# Patient Record
Sex: Male | Born: 1960 | Race: Black or African American | Hispanic: No | Marital: Married | Smoking: Never smoker
Health system: Southern US, Community
[De-identification: ages and names within clinical notes are randomized; demographics above are authoritative.]

## PROBLEM LIST (undated history)

## (undated) DIAGNOSIS — Z95810 Presence of automatic (implantable) cardiac defibrillator: Secondary | ICD-10-CM

## (undated) DIAGNOSIS — I509 Heart failure, unspecified: Secondary | ICD-10-CM

## (undated) DIAGNOSIS — E669 Obesity, unspecified: Secondary | ICD-10-CM

## (undated) DIAGNOSIS — R0600 Dyspnea, unspecified: Secondary | ICD-10-CM

## (undated) DIAGNOSIS — E119 Type 2 diabetes mellitus without complications: Secondary | ICD-10-CM

## (undated) DIAGNOSIS — I219 Acute myocardial infarction, unspecified: Secondary | ICD-10-CM

## (undated) DIAGNOSIS — N289 Disorder of kidney and ureter, unspecified: Secondary | ICD-10-CM

## (undated) DIAGNOSIS — K551 Chronic vascular disorders of intestine: Secondary | ICD-10-CM

## (undated) DIAGNOSIS — M109 Gout, unspecified: Secondary | ICD-10-CM

## (undated) DIAGNOSIS — I2510 Atherosclerotic heart disease of native coronary artery without angina pectoris: Secondary | ICD-10-CM

## (undated) DIAGNOSIS — R609 Edema, unspecified: Secondary | ICD-10-CM

## (undated) DIAGNOSIS — I255 Ischemic cardiomyopathy: Secondary | ICD-10-CM

## (undated) DIAGNOSIS — I10 Essential (primary) hypertension: Secondary | ICD-10-CM

## (undated) HISTORY — DX: Ischemic cardiomyopathy: I25.5

## (undated) HISTORY — PX: SHOULDER OPEN ROTATOR CUFF REPAIR: SHX2407

## (undated) HISTORY — DX: Chronic vascular disorders of intestine: K55.1

## (undated) HISTORY — DX: Edema, unspecified: R60.9

## (undated) HISTORY — DX: Atherosclerotic heart disease of native coronary artery without angina pectoris: I25.10

## (undated) HISTORY — DX: Obesity, unspecified: E66.9

## (undated) HISTORY — DX: Essential (primary) hypertension: I10

---

## 2010-11-21 ENCOUNTER — Inpatient Hospital Stay (HOSPITAL_COMMUNITY)
Admission: EM | Admit: 2010-11-21 | Discharge: 2010-12-13 | DRG: 546 | Disposition: A | Discharge status: Home Health | Payer: BC Managed Care – HMO | Attending: Surgery | Admitting: Surgery

## 2010-11-21 ENCOUNTER — Emergency Department (HOSPITAL_COMMUNITY): Payer: BC Managed Care – HMO

## 2010-11-21 ENCOUNTER — Encounter: Payer: Self-pay | Admitting: Cardiology

## 2010-11-21 DIAGNOSIS — I2510 Atherosclerotic heart disease of native coronary artery without angina pectoris: Secondary | ICD-10-CM | POA: Diagnosis present

## 2010-11-21 DIAGNOSIS — G4733 Obstructive sleep apnea (adult) (pediatric): Secondary | ICD-10-CM | POA: Diagnosis present

## 2010-11-21 DIAGNOSIS — I279 Pulmonary heart disease, unspecified: Secondary | ICD-10-CM | POA: Diagnosis present

## 2010-11-21 DIAGNOSIS — D62 Acute posthemorrhagic anemia: Secondary | ICD-10-CM | POA: Diagnosis not present

## 2010-11-21 DIAGNOSIS — Z7982 Long term (current) use of aspirin: Secondary | ICD-10-CM

## 2010-11-21 DIAGNOSIS — M109 Gout, unspecified: Secondary | ICD-10-CM | POA: Diagnosis present

## 2010-11-21 DIAGNOSIS — I4510 Unspecified right bundle-branch block: Secondary | ICD-10-CM | POA: Diagnosis present

## 2010-11-21 DIAGNOSIS — E785 Hyperlipidemia, unspecified: Secondary | ICD-10-CM | POA: Diagnosis present

## 2010-11-21 DIAGNOSIS — E119 Type 2 diabetes mellitus without complications: Secondary | ICD-10-CM | POA: Diagnosis present

## 2010-11-21 DIAGNOSIS — I5043 Acute on chronic combined systolic (congestive) and diastolic (congestive) heart failure: Principal | ICD-10-CM | POA: Diagnosis present

## 2010-11-21 DIAGNOSIS — L02419 Cutaneous abscess of limb, unspecified: Secondary | ICD-10-CM | POA: Diagnosis present

## 2010-11-21 DIAGNOSIS — E8809 Other disorders of plasma-protein metabolism, not elsewhere classified: Secondary | ICD-10-CM | POA: Diagnosis present

## 2010-11-21 DIAGNOSIS — I2582 Chronic total occlusion of coronary artery: Secondary | ICD-10-CM | POA: Diagnosis present

## 2010-11-21 DIAGNOSIS — I129 Hypertensive chronic kidney disease with stage 1 through stage 4 chronic kidney disease, or unspecified chronic kidney disease: Secondary | ICD-10-CM | POA: Diagnosis present

## 2010-11-21 DIAGNOSIS — N179 Acute kidney failure, unspecified: Secondary | ICD-10-CM | POA: Diagnosis not present

## 2010-11-21 DIAGNOSIS — I2589 Other forms of chronic ischemic heart disease: Secondary | ICD-10-CM | POA: Diagnosis present

## 2010-11-21 DIAGNOSIS — K5900 Constipation, unspecified: Secondary | ICD-10-CM | POA: Diagnosis not present

## 2010-11-21 DIAGNOSIS — N183 Chronic kidney disease, stage 3 unspecified: Secondary | ICD-10-CM | POA: Diagnosis present

## 2010-11-21 HISTORY — PX: CORONARY ARTERY BYPASS GRAFT: SHX141

## 2010-11-21 HISTORY — PX: CARDIAC CATHETERIZATION: SHX172

## 2010-11-21 LAB — DIFFERENTIAL
Basophils Absolute: 0.1 10*3/uL (ref 0.0–0.1)
Lymphocytes Relative: 24 % (ref 12–46)
Lymphs Abs: 1.6 10*3/uL (ref 0.7–4.0)
Neutrophils Relative %: 60 % (ref 43–77)

## 2010-11-21 LAB — CBC
HCT: 38.7 % — ABNORMAL LOW (ref 39.0–52.0)
MCV: 83.8 fL (ref 78.0–100.0)
Platelets: 403 10*3/uL — ABNORMAL HIGH (ref 150–400)
RBC: 4.62 MIL/uL (ref 4.22–5.81)
WBC: 6.7 10*3/uL (ref 4.0–10.5)

## 2010-11-21 LAB — COMPREHENSIVE METABOLIC PANEL
ALT: 23 U/L (ref 0–53)
Albumin: 2.5 g/dL — ABNORMAL LOW (ref 3.5–5.2)
Alkaline Phosphatase: 121 U/L — ABNORMAL HIGH (ref 39–117)
BUN: 30 mg/dL — ABNORMAL HIGH (ref 6–23)
Chloride: 104 mEq/L (ref 96–112)
Glucose, Bld: 169 mg/dL — ABNORMAL HIGH (ref 70–99)
Potassium: 5 mEq/L (ref 3.5–5.1)
Sodium: 136 mEq/L (ref 135–145)
Total Bilirubin: 0.4 mg/dL (ref 0.3–1.2)
Total Protein: 6.2 g/dL (ref 6.0–8.3)

## 2010-11-22 ENCOUNTER — Encounter: Payer: Self-pay | Admitting: Cardiology

## 2010-11-22 DIAGNOSIS — R609 Edema, unspecified: Secondary | ICD-10-CM

## 2010-11-22 LAB — GLUCOSE, CAPILLARY
Glucose-Capillary: 208 mg/dL — ABNORMAL HIGH (ref 70–99)
Glucose-Capillary: 190 mg/dL — ABNORMAL HIGH (ref 70–99)
Glucose-Capillary: 235 mg/dL — ABNORMAL HIGH (ref 70–99)

## 2010-11-22 LAB — CARDIAC PANEL(CRET KIN+CKTOT+MB+TROPI)
CK, MB: 3.4 ng/mL (ref 0.3–4.0)
Total CK: 164 U/L (ref 7–232)

## 2010-11-22 LAB — URINALYSIS, ROUTINE W REFLEX MICROSCOPIC
Ketones, ur: NEGATIVE mg/dL
Leukocytes, UA: NEGATIVE
Nitrite: NEGATIVE
Urobilinogen, UA: 0.2 mg/dL (ref 0.0–1.0)
pH: 5 (ref 5.0–8.0)

## 2010-11-22 LAB — COMPREHENSIVE METABOLIC PANEL
Albumin: 2.4 g/dL — ABNORMAL LOW (ref 3.5–5.2)
Alkaline Phosphatase: 114 U/L (ref 39–117)
BUN: 27 mg/dL — ABNORMAL HIGH (ref 6–23)
CO2: 21 mEq/L (ref 19–32)
Chloride: 103 mEq/L (ref 96–112)
Creatinine, Ser: 1.54 mg/dL — ABNORMAL HIGH (ref 0.4–1.5)
GFR calc non Af Amer: 49 mL/min — ABNORMAL LOW (ref >60)
Glucose, Bld: 160 mg/dL — ABNORMAL HIGH (ref 70–99)
Potassium: 4.3 mEq/L (ref 3.5–5.1)
Total Bilirubin: 0.8 mg/dL (ref 0.3–1.2)

## 2010-11-22 LAB — CBC
HCT: 36.7 % — ABNORMAL LOW (ref 39.0–52.0)
Hemoglobin: 11.9 g/dL — ABNORMAL LOW (ref 13.0–17.0)
MCH: 26.6 pg (ref 26.0–34.0)
MCHC: 32.4 g/dL (ref 30.0–36.0)
MCV: 81.9 fL (ref 78.0–100.0)
RBC: 4.48 MIL/uL (ref 4.22–5.81)

## 2010-11-22 LAB — URIC ACID: Uric Acid, Serum: 12.2 mg/dL — ABNORMAL HIGH (ref 4.0–7.8)

## 2010-11-22 LAB — CK TOTAL AND CKMB (NOT AT ARMC): Total CK: 218 U/L (ref 7–232)

## 2010-11-22 LAB — BRAIN NATRIURETIC PEPTIDE: Pro B Natriuretic peptide (BNP): 1900 pg/mL — ABNORMAL HIGH (ref 0.0–100.0)

## 2010-11-22 LAB — LIPID PANEL
Cholesterol: 241 mg/dL — ABNORMAL HIGH (ref 0–200)
LDL Cholesterol: 162 mg/dL — ABNORMAL HIGH (ref 0–99)
Total CHOL/HDL Ratio: 3.6 RATIO

## 2010-11-22 LAB — URINE MICROSCOPIC-ADD ON

## 2010-11-22 LAB — HEMOGLOBIN A1C: Mean Plasma Glucose: 249 mg/dL — ABNORMAL HIGH (ref <117)

## 2010-11-22 LAB — TROPONIN I: Troponin I: 0.05 ng/mL (ref 0.00–0.06)

## 2010-11-23 ENCOUNTER — Encounter: Payer: Self-pay | Admitting: Nurse Practitioner

## 2010-11-23 DIAGNOSIS — I5041 Acute combined systolic (congestive) and diastolic (congestive) heart failure: Secondary | ICD-10-CM

## 2010-11-23 DIAGNOSIS — R609 Edema, unspecified: Secondary | ICD-10-CM

## 2010-11-23 LAB — GLUCOSE, CAPILLARY
Glucose-Capillary: 268 mg/dL — ABNORMAL HIGH (ref 70–99)
Glucose-Capillary: 218 mg/dL — ABNORMAL HIGH (ref 70–99)

## 2010-11-23 LAB — BASIC METABOLIC PANEL
CO2: 22 mEq/L (ref 19–32)
Calcium: 8.5 mg/dL (ref 8.4–10.5)
Chloride: 104 mEq/L (ref 96–112)
Glucose, Bld: 214 mg/dL — ABNORMAL HIGH (ref 70–99)
Potassium: 4 mEq/L (ref 3.5–5.1)
Sodium: 133 mEq/L — ABNORMAL LOW (ref 135–145)

## 2010-11-24 LAB — CBC
MCV: 82.3 fL (ref 78.0–100.0)
Platelets: 366 10*3/uL (ref 150–400)
RBC: 4.51 MIL/uL (ref 4.22–5.81)
WBC: 6.9 10*3/uL (ref 4.0–10.5)

## 2010-11-24 LAB — GLUCOSE, CAPILLARY
Glucose-Capillary: 174 mg/dL — ABNORMAL HIGH (ref 70–99)
Glucose-Capillary: 228 mg/dL — ABNORMAL HIGH (ref 70–99)
Glucose-Capillary: 220 mg/dL — ABNORMAL HIGH (ref 70–99)

## 2010-11-24 LAB — BASIC METABOLIC PANEL
Calcium: 8.8 mg/dL (ref 8.4–10.5)
GFR calc Af Amer: 59 mL/min — ABNORMAL LOW (ref >60)
GFR calc non Af Amer: 49 mL/min — ABNORMAL LOW (ref >60)
Sodium: 135 mEq/L (ref 135–145)

## 2010-11-24 LAB — PROTIME-INR: Prothrombin Time: 14.8 seconds (ref 11.6–15.2)

## 2010-11-25 LAB — BASIC METABOLIC PANEL
BUN: 24 mg/dL — ABNORMAL HIGH (ref 6–23)
CO2: 24 mEq/L (ref 19–32)
Chloride: 98 mEq/L (ref 96–112)
Creatinine, Ser: 1.56 mg/dL — ABNORMAL HIGH (ref 0.4–1.5)

## 2010-11-25 LAB — GLUCOSE, CAPILLARY
Glucose-Capillary: 218 mg/dL — ABNORMAL HIGH (ref 70–99)
Glucose-Capillary: 266 mg/dL — ABNORMAL HIGH (ref 70–99)

## 2010-11-26 ENCOUNTER — Encounter: Payer: Self-pay | Admitting: Cardiology

## 2010-11-26 DIAGNOSIS — I428 Other cardiomyopathies: Secondary | ICD-10-CM

## 2010-11-26 LAB — POCT I-STAT 3, VENOUS BLOOD GAS (G3P V)
Acid-Base Excess: 1 mmol/L (ref 0.0–2.0)
Bicarbonate: 23.2 mEq/L (ref 20.0–24.0)
Bicarbonate: 24.8 mEq/L — ABNORMAL HIGH (ref 20.0–24.0)
O2 Saturation: 65 %
O2 Saturation: 69 %
TCO2: 24 mmol/L (ref 0–100)
pCO2, Ven: 39.2 mmHg — ABNORMAL LOW (ref 45.0–50.0)

## 2010-11-26 LAB — BASIC METABOLIC PANEL
BUN: 28 mg/dL — ABNORMAL HIGH (ref 6–23)
CO2: 24 mEq/L (ref 19–32)
Calcium: 8.4 mg/dL (ref 8.4–10.5)
Creatinine, Ser: 1.67 mg/dL — ABNORMAL HIGH (ref 0.4–1.5)
GFR calc Af Amer: 54 mL/min — ABNORMAL LOW (ref >60)

## 2010-11-26 LAB — POCT I-STAT 3, ART BLOOD GAS (G3+)
Acid-Base Excess: 1 mmol/L (ref 0.0–2.0)
O2 Saturation: 97 %
TCO2: 25 mmol/L (ref 0–100)
pCO2 arterial: 34.1 mmHg — ABNORMAL LOW (ref 35.0–45.0)

## 2010-11-26 LAB — GLUCOSE, CAPILLARY
Glucose-Capillary: 216 mg/dL — ABNORMAL HIGH (ref 70–99)
Glucose-Capillary: 196 mg/dL — ABNORMAL HIGH (ref 70–99)
Glucose-Capillary: 168 mg/dL — ABNORMAL HIGH (ref 70–99)
Glucose-Capillary: 212 mg/dL — ABNORMAL HIGH (ref 70–99)

## 2010-11-27 ENCOUNTER — Encounter: Payer: Self-pay | Admitting: Cardiology

## 2010-11-27 DIAGNOSIS — Z01810 Encounter for preprocedural cardiovascular examination: Secondary | ICD-10-CM

## 2010-11-27 DIAGNOSIS — I2510 Atherosclerotic heart disease of native coronary artery without angina pectoris: Secondary | ICD-10-CM

## 2010-11-27 DIAGNOSIS — I509 Heart failure, unspecified: Secondary | ICD-10-CM

## 2010-11-27 LAB — GLUCOSE, CAPILLARY
Glucose-Capillary: 240 mg/dL — ABNORMAL HIGH (ref 70–99)
Glucose-Capillary: 248 mg/dL — ABNORMAL HIGH (ref 70–99)

## 2010-11-27 LAB — CBC
HCT: 35.3 % — ABNORMAL LOW (ref 39.0–52.0)
Hemoglobin: 11.8 g/dL — ABNORMAL LOW (ref 13.0–17.0)
MCH: 28 pg (ref 26.0–34.0)
MCHC: 33.4 g/dL (ref 30.0–36.0)
RBC: 4.22 MIL/uL (ref 4.22–5.81)

## 2010-11-27 LAB — BASIC METABOLIC PANEL
CO2: 24 mEq/L (ref 19–32)
Chloride: 99 mEq/L (ref 96–112)
GFR calc Af Amer: 53 mL/min — ABNORMAL LOW (ref >60)
Glucose, Bld: 172 mg/dL — ABNORMAL HIGH (ref 70–99)
Sodium: 132 mEq/L — ABNORMAL LOW (ref 135–145)

## 2010-11-28 DIAGNOSIS — I2510 Atherosclerotic heart disease of native coronary artery without angina pectoris: Secondary | ICD-10-CM

## 2010-11-28 DIAGNOSIS — I5023 Acute on chronic systolic (congestive) heart failure: Secondary | ICD-10-CM

## 2010-11-28 LAB — TYPE AND SCREEN: Antibody Screen: NEGATIVE

## 2010-11-28 LAB — BASIC METABOLIC PANEL
BUN: 33 mg/dL — ABNORMAL HIGH (ref 6–23)
CO2: 26 mEq/L (ref 19–32)
Calcium: 8.3 mg/dL — ABNORMAL LOW (ref 8.4–10.5)
Chloride: 98 mEq/L (ref 96–112)
Creatinine, Ser: 2.05 mg/dL — ABNORMAL HIGH (ref 0.4–1.5)
Glucose, Bld: 175 mg/dL — ABNORMAL HIGH (ref 70–99)

## 2010-11-28 LAB — GLUCOSE, CAPILLARY: Glucose-Capillary: 155 mg/dL — ABNORMAL HIGH (ref 70–99)

## 2010-11-28 LAB — ABO/RH: ABO/RH(D): A POS

## 2010-11-29 DIAGNOSIS — I2510 Atherosclerotic heart disease of native coronary artery without angina pectoris: Secondary | ICD-10-CM

## 2010-11-29 LAB — GLUCOSE, CAPILLARY
Glucose-Capillary: 140 mg/dL — ABNORMAL HIGH (ref 70–99)
Glucose-Capillary: 182 mg/dL — ABNORMAL HIGH (ref 70–99)

## 2010-11-29 LAB — BASIC METABOLIC PANEL
BUN: 34 mg/dL — ABNORMAL HIGH (ref 6–23)
GFR calc Af Amer: 35 mL/min — ABNORMAL LOW (ref >60)
GFR calc non Af Amer: 29 mL/min — ABNORMAL LOW (ref >60)
Potassium: 3.8 mEq/L (ref 3.5–5.1)
Sodium: 132 mEq/L — ABNORMAL LOW (ref 135–145)

## 2010-11-29 LAB — CBC
MCV: 84 fL (ref 78.0–100.0)
Platelets: 373 10*3/uL (ref 150–400)
RDW: 14.8 % (ref 11.5–15.5)
WBC: 6.9 10*3/uL (ref 4.0–10.5)

## 2010-11-29 LAB — SURGICAL PCR SCREEN: Staphylococcus aureus: NEGATIVE

## 2010-11-30 DIAGNOSIS — I2510 Atherosclerotic heart disease of native coronary artery without angina pectoris: Secondary | ICD-10-CM

## 2010-11-30 LAB — BASIC METABOLIC PANEL
Calcium: 8.6 mg/dL (ref 8.4–10.5)
Chloride: 97 mEq/L (ref 96–112)
Creatinine, Ser: 2.27 mg/dL — ABNORMAL HIGH (ref 0.4–1.5)
GFR calc Af Amer: 38 mL/min — ABNORMAL LOW (ref >60)

## 2010-11-30 LAB — GLUCOSE, CAPILLARY
Glucose-Capillary: 167 mg/dL — ABNORMAL HIGH (ref 70–99)
Glucose-Capillary: 222 mg/dL — ABNORMAL HIGH (ref 70–99)

## 2010-11-30 LAB — DIGOXIN LEVEL: Digoxin Level: 0.5 ng/mL — ABNORMAL LOW (ref 0.8–2.0)

## 2010-12-01 DIAGNOSIS — I2510 Atherosclerotic heart disease of native coronary artery without angina pectoris: Secondary | ICD-10-CM

## 2010-12-01 LAB — BASIC METABOLIC PANEL
CO2: 27 mEq/L (ref 19–32)
Chloride: 97 mEq/L (ref 96–112)
Creatinine, Ser: 2.1 mg/dL — ABNORMAL HIGH (ref 0.4–1.5)
GFR calc Af Amer: 41 mL/min — ABNORMAL LOW (ref >60)
Sodium: 134 mEq/L — ABNORMAL LOW (ref 135–145)

## 2010-12-01 LAB — GLUCOSE, CAPILLARY
Glucose-Capillary: 231 mg/dL — ABNORMAL HIGH (ref 70–99)
Glucose-Capillary: 172 mg/dL — ABNORMAL HIGH (ref 70–99)

## 2010-12-02 LAB — CBC
Platelets: 433 10*3/uL — ABNORMAL HIGH (ref 150–400)
RDW: 14.4 % (ref 11.5–15.5)
WBC: 5.3 10*3/uL (ref 4.0–10.5)

## 2010-12-02 LAB — GLUCOSE, CAPILLARY: Glucose-Capillary: 143 mg/dL — ABNORMAL HIGH (ref 70–99)

## 2010-12-02 LAB — BASIC METABOLIC PANEL
GFR calc Af Amer: 38 mL/min — ABNORMAL LOW (ref >60)
GFR calc non Af Amer: 32 mL/min — ABNORMAL LOW (ref >60)
Potassium: 3.8 mEq/L (ref 3.5–5.1)
Sodium: 134 mEq/L — ABNORMAL LOW (ref 135–145)

## 2010-12-03 LAB — GLUCOSE, CAPILLARY
Glucose-Capillary: 155 mg/dL — ABNORMAL HIGH (ref 70–99)
Glucose-Capillary: 146 mg/dL — ABNORMAL HIGH (ref 70–99)
Glucose-Capillary: 203 mg/dL — ABNORMAL HIGH (ref 70–99)

## 2010-12-03 LAB — BASIC METABOLIC PANEL
BUN: 35 mg/dL — ABNORMAL HIGH (ref 6–23)
Chloride: 101 mEq/L (ref 96–112)
Glucose, Bld: 163 mg/dL — ABNORMAL HIGH (ref 70–99)
Potassium: 4 mEq/L (ref 3.5–5.1)

## 2010-12-03 NOTE — Progress Notes (Signed)
James [name], James [name]                  ACCOUNT NO.:  [id number]  MEDICAL RECORD NO.:  [id number]           PATIENT TYPE:  LOCATION:                                 FACILITY:  PHYSICIAN:  Mauro Kaufmann, MD         DATE OF BIRTH:  07/28/1961                                PROGRESS NOTE   CURRENT DIAGNOSES: 1. Severe coronary artery disease with total occlusion of left     anterior descending, total occlusion of right coronary artery and     severe disease involving the remaining left circumflex vessel as     seen on cardiac catheterization. 2. Diabetes mellitus. 3. Hypertension. 4. Questionable sleep apnea. 5. Acute renal insufficiency. 6. Morbid obesity. 7. Congestive heart failure with an ejection fraction of 20-25% (new     onset). 8. Gout.  CONSULTATIONS OBTAINED DURING HOSPITAL STAY: Cardiology consultation.  TESTS PERFORMED DURING THE HOSPITAL STAY: Chest x-ray which showed mild cardiomegaly and pulmonary vascular congestion and no acute findings.  PERTINENT LABORATORY DATA: In the hospital on the day of admission, the patient's BNP was 1900, uric acid was 12.2, TSH 2.032.  BRIEF HISTORY AND PHYSICAL: This is a 50 year old male with a history of chronic lower extremity cellulitis and hypertension, gout, diabetes mellitus type 2 who has been experiencing increasing swelling of the lower extremities for the last 2 days and increase in abdominal girth.  The patient did not have any chest pain or shortness of breath, did not have nausea, vomiting, or abdominal pain, did not have fever, chills, or dysuria.  The patient was seen by his primary care and he was found to have a high blood glucose and he added glipizide.  The patient's blood glucose was better but he had increase in abdominal girth with increasing leg swelling, and the patient came to the ER.  At the hospital, the patient was found to have BNP of 1800.  The patient was admitted with diagnosis of acute CHF, started  on Lasix.  BRIEF HOSPITAL COURSE: 1. Congestive heart failure.  Initially, the patient was started on     Lasix and diuresed very well.  The patient had an echocardiogram     done which showed ejection fraction of 20-25% with diffuse     hypokinesis and also grade 3 diastolic dysfunction, so Cardiology     consultation was obtained, and the patient underwent cardiac     catheterization on November 26, 2010. 2. CAD.  As per cardiac catheterization, the patient has severe     coronary artery disease with a total occlusion of the left anterior     descending and the right coronary artery and severe disease in the     circumflex artery.  At this time, cardiovascular surgeon is going     to see the patient for possible revascularization and also there is     a plan for ICD placement.  Cardiology is following and making these     decisions, and we will follow the patient. 3. Diabetes mellitus.  The patient's blood sugar has been hard to  control, and he has been currently on Lantus insulin sliding scale.     We have also added the patient on meal coverage.  At this time, the     blood sugar seems to be pretty good controlled.  We will follow the     blood sugar and make the necessary adjustments. 4. Gout.  The patient also has gout with elevated uric acid.  He has     been started on allopurinol and also started on colchicine. 5. Disposition.  As per Cardiology recommendations. 6. Hypertension.  The patient's blood pressure has been under good     control.  Currently, he is on Coreg, hydralazine, Benicar, and     blood pressure seems to be fairly well controlled at this time. 7. Hyperlipidemia.  The patient's LDL cholesterol is 162 and has been     started on Crestor.     Mauro Kaufmann, MD     GL/MEDQ  D:  11/27/2010  T:  11/27/2010  Job:  [id number]  Electronically Signed by Mauro Kaufmann  on 12/03/2010 11:25:46 AM

## 2010-12-04 LAB — BASIC METABOLIC PANEL
Calcium: 8.7 mg/dL (ref 8.4–10.5)
Creatinine, Ser: 2.1 mg/dL — ABNORMAL HIGH (ref 0.4–1.5)
GFR calc Af Amer: 41 mL/min — ABNORMAL LOW (ref >60)
GFR calc non Af Amer: 34 mL/min — ABNORMAL LOW (ref >60)
Glucose, Bld: 175 mg/dL — ABNORMAL HIGH (ref 70–99)
Sodium: 133 mEq/L — ABNORMAL LOW (ref 135–145)

## 2010-12-04 LAB — GLUCOSE, CAPILLARY: Glucose-Capillary: 190 mg/dL — ABNORMAL HIGH (ref 70–99)

## 2010-12-05 ENCOUNTER — Encounter: Payer: Self-pay | Admitting: Cardiology

## 2010-12-05 ENCOUNTER — Inpatient Hospital Stay (HOSPITAL_COMMUNITY): Payer: BC Managed Care – HMO

## 2010-12-05 DIAGNOSIS — I2510 Atherosclerotic heart disease of native coronary artery without angina pectoris: Secondary | ICD-10-CM

## 2010-12-05 LAB — POCT I-STAT 3, ART BLOOD GAS (G3+)
Acid-Base Excess: 2 mmol/L (ref 0.0–2.0)
Acid-Base Excess: 2 mmol/L (ref 0.0–2.0)
Acid-base deficit: 1 mmol/L (ref 0.0–2.0)
O2 Saturation: 100 %
O2 Saturation: 96 %
O2 Saturation: 98 %
Patient temperature: 32.5
Patient temperature: 37
TCO2: 26 mmol/L (ref 0–100)
TCO2: 27 mmol/L (ref 0–100)
pCO2 arterial: 31.9 mmHg — ABNORMAL LOW (ref 35.0–45.0)
pCO2 arterial: 40.4 mmHg (ref 35.0–45.0)
pCO2 arterial: 46.6 mmHg — ABNORMAL HIGH (ref 35.0–45.0)
pH, Arterial: 7.389 (ref 7.350–7.450)
pO2, Arterial: 76 mmHg — ABNORMAL LOW (ref 80.0–100.0)
pO2, Arterial: 116 mmHg — ABNORMAL HIGH (ref 80.0–100.0)

## 2010-12-05 LAB — CBC
HCT: 34.7 % — ABNORMAL LOW (ref 39.0–52.0)
Hemoglobin: 11.1 g/dL — ABNORMAL LOW (ref 13.0–17.0)
MCH: 27.1 pg (ref 26.0–34.0)
MCHC: 32.7 g/dL (ref 30.0–36.0)
MCHC: 32 g/dL (ref 30.0–36.0)
MCV: 83.9 fL (ref 78.0–100.0)
MCV: 84.6 fL (ref 78.0–100.0)
Platelets: 337 10*3/uL (ref 150–400)
RDW: 14.5 % (ref 11.5–15.5)
RDW: 14.5 % (ref 11.5–15.5)
WBC: 15.3 10*3/uL — ABNORMAL HIGH (ref 4.0–10.5)

## 2010-12-05 LAB — POCT I-STAT 4, (NA,K, GLUC, HGB,HCT)
Glucose, Bld: 119 mg/dL — ABNORMAL HIGH (ref 70–99)
Glucose, Bld: 121 mg/dL — ABNORMAL HIGH (ref 70–99)
HCT: 35 % — ABNORMAL LOW (ref 39.0–52.0)
HCT: 31 % — ABNORMAL LOW (ref 39.0–52.0)
HCT: 32 % — ABNORMAL LOW (ref 39.0–52.0)
Hemoglobin: 12.9 g/dL — ABNORMAL LOW (ref 13.0–17.0)
Hemoglobin: 10.9 g/dL — ABNORMAL LOW (ref 13.0–17.0)
Hemoglobin: 12.9 g/dL — ABNORMAL LOW (ref 13.0–17.0)
Potassium: 3.6 mEq/L (ref 3.5–5.1)
Potassium: 4.8 mEq/L (ref 3.5–5.1)
Potassium: 3.6 mEq/L (ref 3.5–5.1)
Potassium: 3.5 mEq/L (ref 3.5–5.1)
Sodium: 136 mEq/L (ref 135–145)
Sodium: 134 mEq/L — ABNORMAL LOW (ref 135–145)
Sodium: 135 mEq/L (ref 135–145)
Sodium: 137 mEq/L (ref 135–145)
Sodium: 137 mEq/L (ref 135–145)

## 2010-12-05 LAB — BASIC METABOLIC PANEL
BUN: 36 mg/dL — ABNORMAL HIGH (ref 6–23)
Calcium: 8.8 mg/dL (ref 8.4–10.5)
Creatinine, Ser: 2.13 mg/dL — ABNORMAL HIGH (ref 0.4–1.5)
GFR calc non Af Amer: 33 mL/min — ABNORMAL LOW (ref >60)
Glucose, Bld: 188 mg/dL — ABNORMAL HIGH (ref 70–99)

## 2010-12-05 LAB — BLOOD GAS, ARTERIAL
Acid-Base Excess: 2.7 mmol/L — ABNORMAL HIGH (ref 0.0–2.0)
Drawn by: 24487
FIO2: 0.21 %
O2 Saturation: 96.4 %
Patient temperature: 98.6

## 2010-12-05 LAB — POCT I-STAT, CHEM 8
BUN: 29 mg/dL — ABNORMAL HIGH (ref 6–23)
Calcium, Ion: 1.17 mmol/L (ref 1.12–1.32)
Chloride: 108 mEq/L (ref 96–112)
Creatinine, Ser: 1.7 mg/dL — ABNORMAL HIGH (ref 0.4–1.5)
Glucose, Bld: 147 mg/dL — ABNORMAL HIGH (ref 70–99)
HCT: 34 % — ABNORMAL LOW (ref 39.0–52.0)
Potassium: 3.9 mEq/L (ref 3.5–5.1)

## 2010-12-05 LAB — POCT I-STAT 3, VENOUS BLOOD GAS (G3P V)
O2 Saturation: 82 %
Patient temperature: 32.5
pO2, Ven: 35 mmHg (ref 30.0–45.0)

## 2010-12-05 LAB — MAGNESIUM: Magnesium: 3.2 mg/dL — ABNORMAL HIGH (ref 1.5–2.5)

## 2010-12-05 LAB — APTT: aPTT: 35 seconds (ref 24–37)

## 2010-12-05 LAB — HEMOGLOBIN AND HEMATOCRIT, BLOOD: Hemoglobin: 10.1 g/dL — ABNORMAL LOW (ref 13.0–17.0)

## 2010-12-05 LAB — CREATININE, SERUM: Creatinine, Ser: 1.71 mg/dL — ABNORMAL HIGH (ref 0.4–1.5)

## 2010-12-05 LAB — GLUCOSE, CAPILLARY: Glucose-Capillary: 157 mg/dL — ABNORMAL HIGH (ref 70–99)

## 2010-12-05 NOTE — Consult Note (Signed)
[name], HOUSE NO.:  [id number]  MEDICAL RECORD NO.:  [id number]           PATIENT TYPE:  I  LOCATION:  2508                         FACILITY:  [facility]  PHYSICIAN:  Karee Christopherson M. [name], M.D.  DATE OF BIRTH:  [birth year]  DATE OF CONSULTATION:  11/23/2010 DATE OF DISCHARGE:                                CONSULTATION   PRIMARY CARDIOLOGIST:  [name], seen by Dr. [place].  The patient lives in [place] and will likely follow up in our [place] office.  His primary care provider is in [place].  PATIENT PROFILE:  A 50 year old African American male without prior cardiac history who over the past month plus has been noticing increasing lower extremity edema and abdominal girth as well as weight gain and was admitted to [facility] on November 21, 2010, and found to have an EF of 20-25%.  PROBLEMS: 1. Acute systolic and diastolic congestive heart failure/presumed     nonischemic cardiomyopathy.     a.     November 22, 2010, 2-D echocardiogram, ejection fraction of      20-25% with diffuse hypokinesis.  Grade 3 diastolic dysfunction.      Mild-to-moderate mitral regurgitation.  Moderately dilated to      severely dilated left atrium.  Mildly dilated right atrium.  PASP      44 mmHg.  Small pericardial effusion. 2. Hypertension. 3. Gout. 4. Poorly controlled type 2 diabetes mellitus with a hemoglobin A1c of     10.3. 5. Obesity.  ALLERGIES:  No known drug allergies.  HISTORY OF PRESENT ILLNESS:  A 50 year old African American male without prior cardiac history.  The patient noted that since September 28, 2011, he has been having progressive lower extremity edema and increasing abdominal girth.  This preceded by initially productive and subsequently nonproductive cough and in late the December treated with a Z-Pak. Cough has lingered somewhat and then over the past few weeks, he has been noticing increasing lower extremity edema and abdominal  girth.  He also was noted significant weight gain stating at one point it was 275 pounds.  He is currently in the mid 290s.  Because of progressive swelling and the absence of PND, orthopnea, or dyspnea on exertion or chest pain, the patient presents to [name] ED on November 21, 2010, where he was found to have BNP of 1826.  He was admitted and treated with three doses of IV Lasix with negative about 3.5 L.  His creatinine which was 1.76 on admission is 1.4 today.  The patient still has significant volume overload and we have been asked to evaluate.  CURRENT MEDICATIONS: 1. Aspirin 325 mg daily. 2. Olmesartan 40 mg daily. 3. Labetalol 300 mg t.i.d. 4. Nitro paste 1 inch q.6 h. 5. Lovenox 40 mg subcu daily.  FAMILY HISTORY:  Both parents are alive with hypertension and diabetes mellitus.  His sisters are alive with hypertension.  SOCIAL HISTORY:  The patient lives in [place], [place], with his wife.  He works as a Personnel officer.  He has one child.  Denies tobacco or drug use.  He has an occasional beer.  He is trying to stick to a low sodium diet and has had been walking 1 mile daily.  REVIEW OF SYSTEMS:  Positive for increasing lower extremity swelling and increasing abdominal girth.  He did have one dizzy episode a few weeks ago, which resolved without intervention.  He has been having nonproductive cough.  His wife said he snores at night.  He is a full code.  Otherwise, all systems reviewed and negative.  PHYSICAL EXAMINATION:  VITAL SIGNS:  Temperature 98.2, heart rate 75, respirations 22, blood pressure 126/92, pulse ox 99% on room air, weight is 138.2 kg, height 72 inches. GENERAL:  A Pleasant African American male in no acute distress.  Awake and alert x3.  He has a normal affect. HEENT:  Normal. NEUROLOGIC:  Grossly nonfocal and nonfocal. SKIN:  Warm and dry without lesions or masses. NECK:  Supple without bruits.  JVP up to the jaw. LUNGS:   Respirations are unlabored.  Clear to auscultation. CARDIAC:  Regular S1 and S2.  Positive S4.  Soft systolic murmur at the apex. ABDOMEN:  Obese, soft, and edematous.  Bowel sounds are present x4. EXTREMITIES:  Warm and dry.  No clubbing or cyanosis, 3+ bilateral lower extremity edema to the thighs.  Distal pulses are intact.  Left hand swelling and tenderness.  Chest x-ray on November 22, 2010, showed mild cardiomegaly and pulmonary vascular congestion.  He had lower extremity Dopplers showing no evidence of DVT or superficial thrombosis.  His echo is as outlined above.  EKG shows sinus rhythm, right bundle-branch block, rate of 98, no acute ST findings.  Hemoglobin 11.9, hematocrit 36.7, WBC 4.7, platelets 398.  Sodium 133, potassium 4.0, chloride 104, CO2 of 22, BUN 25, creatinine 1.41, glucose 214.  Total bilirubin 0.8, alkaline phosphatase 114, AST 20, ALT 20, total protein 5.7, albumin 2.4. Calcium 8.5.  Uric acid 12.2.  Magnesium 1.4.  Hemoglobin A1c 10.3.  CK 152, MB 3.1, troponin I 0.04.  BNP was 1900 yesterday.  Total cholesterol 241, triglycerides 62, HDL 67, LDL 162.  TSH 2.032. Urinalysis negative.  ASSESSMENT AND PLAN: 1. Acute systolic and diastolic congestive heart failure.  Question     ischemic versus nonischemic cardiomyopathy.  The patient has a 1-     month history progressive edema, now found to have significantly     depressed ejection fraction of 20-25%.  The patient will require     ischemic evaluation in the form of a cardiac catheterization, which     we can perform on Monday.  He still has significant volume overload     and requires additional diuresis over the weekend.  He is not     currently written for any Lasix and we will add 80 mg IV b.i.d.     first dose now.  He is currently on beta-blocker and ARB therapy,     which will continue.  We will plan to add spirolactone following     diuresis pending creatinine stability.  We will also consider      hydralazine and nitrates.  Finally, we will consider switching     labetalol to carvedilol though his pressures have been reasonable     on his home dose of labetalol, I am unsure we could achieve the     same level of hypertensive management with Coreg.  Finally, if he     remains with elevated heart rates following diuresis, we may want  to consider addition of digoxin given low output. 2. Hypertension.  As above, reasonably controlled on current     medications.  Continue to follow in the setting of diuresis. 3. Hyperlipidemia.  The patient has an LDL of 162, total cholesterol     of 241.  We will go ahead and add statin given history of diabetes     mellitus. 4. Type 2 diabetes mellitus, poorly controlled with A1c of 10.3.  He     takes metformin and glipizide at home and is on Lantus sliding     scale insulin here.  Continue to follow. 5. Pulmonary hypertension.  This was noted on echocardiogram.  The     patient snores at home and it is quite large.  He would definitely     benefit from the outpatient sleep study. 6. Right bundle-branch block.  The patient has a wide QRS in the     setting of low ejection fraction.  Pending additional workup, the     patient may be a reasonable candidate for biventricular implantable     cardioverter-defibrillator in the future if ejection fraction does     not improve. 7. Acute left hand gout.  The patient has pain and swelling of the     left hand with prior history of gout.  His uric acid is elevated at     12.2.  We will go ahead and write for some Colcrys along with     allopurinol.  Avoid steroids in the setting of acute failure. 8. Acute renal failure.  The patient's creatinine was elevated at 1.76     in the setting of low output heart failure.  With diuresis,     creatinine has improved to the 1.4 range.  Continue to follow with     ongoing diuresis.     James [name], ANP   ______________________________ James [name],  M.D.    CB/MEDQ  D:  11/23/2010  T:  11/24/2010  Job:  [id number]  Electronically Signed by James [name] ANP on 12/03/2010 12:14:47 PM Electronically Signed by Luci Bellucci [name] M.D. on 12/05/2010 11:20:54 AM

## 2010-12-06 ENCOUNTER — Inpatient Hospital Stay (HOSPITAL_COMMUNITY): Payer: BC Managed Care – HMO

## 2010-12-06 DIAGNOSIS — E1165 Type 2 diabetes mellitus with hyperglycemia: Secondary | ICD-10-CM

## 2010-12-06 LAB — POCT I-STAT, CHEM 8
Chloride: 104 mEq/L (ref 96–112)
HCT: 36 % — ABNORMAL LOW (ref 39.0–52.0)
Potassium: 4.3 mEq/L (ref 3.5–5.1)

## 2010-12-06 LAB — GLUCOSE, CAPILLARY
Glucose-Capillary: 158 mg/dL — ABNORMAL HIGH (ref 70–99)
Glucose-Capillary: 133 mg/dL — ABNORMAL HIGH (ref 70–99)
Glucose-Capillary: 145 mg/dL — ABNORMAL HIGH (ref 70–99)
Glucose-Capillary: 131 mg/dL — ABNORMAL HIGH (ref 70–99)
Glucose-Capillary: 140 mg/dL — ABNORMAL HIGH (ref 70–99)

## 2010-12-06 LAB — BASIC METABOLIC PANEL
BUN: 30 mg/dL — ABNORMAL HIGH (ref 6–23)
Calcium: 8.4 mg/dL (ref 8.4–10.5)
Creatinine, Ser: 1.72 mg/dL — ABNORMAL HIGH (ref 0.4–1.5)
GFR calc non Af Amer: 43 mL/min — ABNORMAL LOW (ref >60)
Glucose, Bld: 145 mg/dL — ABNORMAL HIGH (ref 70–99)
Potassium: 4 mEq/L (ref 3.5–5.1)

## 2010-12-06 LAB — CBC
HCT: 34 % — ABNORMAL LOW (ref 39.0–52.0)
HCT: 34.9 % — ABNORMAL LOW (ref 39.0–52.0)
Hemoglobin: 11.3 g/dL — ABNORMAL LOW (ref 13.0–17.0)
MCHC: 32.6 g/dL (ref 30.0–36.0)
MCV: 84.4 fL (ref 78.0–100.0)
Platelets: 362 10*3/uL (ref 150–400)
RDW: 14.7 % (ref 11.5–15.5)
RDW: 14.9 % (ref 11.5–15.5)
WBC: 14.5 10*3/uL — ABNORMAL HIGH (ref 4.0–10.5)
WBC: 13 10*3/uL — ABNORMAL HIGH (ref 4.0–10.5)

## 2010-12-06 LAB — CREATININE, SERUM
Creatinine, Ser: 2.14 mg/dL — ABNORMAL HIGH (ref 0.4–1.5)
GFR calc non Af Amer: 33 mL/min — ABNORMAL LOW (ref >60)

## 2010-12-06 LAB — MAGNESIUM: Magnesium: 2.9 mg/dL — ABNORMAL HIGH (ref 1.5–2.5)

## 2010-12-07 ENCOUNTER — Inpatient Hospital Stay (HOSPITAL_COMMUNITY): Payer: BC Managed Care – HMO

## 2010-12-07 ENCOUNTER — Encounter: Payer: Self-pay | Admitting: Cardiology

## 2010-12-07 DIAGNOSIS — E1165 Type 2 diabetes mellitus with hyperglycemia: Secondary | ICD-10-CM

## 2010-12-07 LAB — GLUCOSE, CAPILLARY
Glucose-Capillary: 138 mg/dL — ABNORMAL HIGH (ref 70–99)
Glucose-Capillary: 175 mg/dL — ABNORMAL HIGH (ref 70–99)
Glucose-Capillary: 141 mg/dL — ABNORMAL HIGH (ref 70–99)
Glucose-Capillary: 122 mg/dL — ABNORMAL HIGH (ref 70–99)

## 2010-12-07 LAB — CBC
HCT: 35.2 % — ABNORMAL LOW (ref 39.0–52.0)
RDW: 15.2 % (ref 11.5–15.5)
WBC: 12.1 10*3/uL — ABNORMAL HIGH (ref 4.0–10.5)

## 2010-12-07 LAB — BASIC METABOLIC PANEL
GFR calc non Af Amer: 31 mL/min — ABNORMAL LOW (ref >60)
Glucose, Bld: 171 mg/dL — ABNORMAL HIGH (ref 70–99)
Potassium: 4.2 mEq/L (ref 3.5–5.1)
Sodium: 136 mEq/L (ref 135–145)

## 2010-12-07 NOTE — Op Note (Signed)
  NAMEMAXIMILIEN, HAYASHI NO.:  [id number]  MEDICAL RECORD NO.:  [id number]           PATIENT TYPE:  LOCATION:                                 FACILITY:  PHYSICIAN:  Bedelia Person, M.D.        DATE OF BIRTH:  [birth year]  DATE OF PROCEDURE:  12/05/2010 DATE OF DISCHARGE:                              OPERATIVE REPORT   Intraoperative transesophageal echocardiography report.  PROCEDURE:  Intraoperative transesophageal echocardiography.  Mr. Stauffer is a 50 year old with significant known ischemic cardiomyopathy and multiple medical problems but no pathology associated with esophageal or gastric issues.  He is scheduled at this time for coronary artery bypass grafting.  The TEE will be used intraoperatively to assess valvular function as well as left ventricular function.  After induction with general anesthesia, the airway was secured with an oral endotracheal tube.  An orogastric tube was initially placed to suction gastric contents and then remove.  The transesophageal transducer was lubricated, placed blindly down the oropharynx.  There was no resistance.  It remained at the 45- to 50-cm mark throughout the procedure.  At the completion of the case, the probe was removed.  There was no evidence oral or pharyngeal damage.  Prebypass examination revealed the left ventricle to be significantly dilated.  There was akinesis of the septal and inferior walls.  There was severe hypokinesis of the lateral and anterior walls.  The left atrium was moderately enlarged.  Mitral valve appeared normal with no evidence of prolapse.  Color Doppler did reveal a 1-2+ mitral regurgitant jet extending posteriorly over the posterior leaflet.  There was no reversal of flow documented in the pulmonary veins.  The aortic valve and three leaflets all opening and closing appropriately.  There was no aortic insufficiency.  Right heart was also moderately dilated, especially the right atrium.   Swan-Ganz catheter could be noted transverse the tricuspid valve.  Tricuspid valve appeared normal.  It had a mild regurgitant flow.  The patient was placed on cardiopulmonary bypass at the completion of the procedure.  Inotropic dopamine support was being used as well as milrinone vasodilatation.  Left ventricular function was significantly improved in the lateral wall, especially with some improvement to the anterior wall.  There was no change in the septum or inferior akinesis as noted prebypass.  The mitral valve continued to look normal in appearance and function.  Color Doppler did, however, continue to reveal a 1-2+ regurgitant flow over the posterior leaflet.  The aortic valve was unchanged from prebypass examination, and the right heart appeared somewhat reduced in size from the dilated appearance noted prebypass.  Tricuspid valve continued to show just a wisp of regurgitation.  There were no other significant changes noted.         ______________________________ Bedelia Person, M.D.    LK/MEDQ  D:  12/05/2010  T:  12/06/2010  Job:  [id number]  Electronically Signed by Bedelia Person M.D. on 12/07/2010 01:16:42 PM

## 2010-12-08 LAB — CROSSMATCH
ABO/RH(D): A POS
Unit division: 0

## 2010-12-08 LAB — BASIC METABOLIC PANEL
Chloride: 103 mEq/L (ref 96–112)
GFR calc non Af Amer: 36 mL/min — ABNORMAL LOW (ref >60)
Potassium: 4 mEq/L (ref 3.5–5.1)
Sodium: 135 mEq/L (ref 135–145)

## 2010-12-08 LAB — GLUCOSE, CAPILLARY
Glucose-Capillary: 117 mg/dL — ABNORMAL HIGH (ref 70–99)
Glucose-Capillary: 95 mg/dL (ref 70–99)
Glucose-Capillary: 142 mg/dL — ABNORMAL HIGH (ref 70–99)

## 2010-12-09 LAB — BASIC METABOLIC PANEL
BUN: 44 mg/dL — ABNORMAL HIGH (ref 6–23)
CO2: 24 mEq/L (ref 19–32)
Chloride: 104 mEq/L (ref 96–112)
Creatinine, Ser: 2.46 mg/dL — ABNORMAL HIGH (ref 0.4–1.5)
Glucose, Bld: 108 mg/dL — ABNORMAL HIGH (ref 70–99)
Potassium: 4.1 mEq/L (ref 3.5–5.1)

## 2010-12-09 LAB — GLUCOSE, CAPILLARY: Glucose-Capillary: 144 mg/dL — ABNORMAL HIGH (ref 70–99)

## 2010-12-10 ENCOUNTER — Inpatient Hospital Stay (HOSPITAL_COMMUNITY): Payer: BC Managed Care – HMO

## 2010-12-10 ENCOUNTER — Encounter: Payer: Self-pay | Admitting: Cardiology

## 2010-12-10 DIAGNOSIS — IMO0001 Reserved for inherently not codable concepts without codable children: Secondary | ICD-10-CM

## 2010-12-10 DIAGNOSIS — E1165 Type 2 diabetes mellitus with hyperglycemia: Secondary | ICD-10-CM

## 2010-12-10 LAB — CBC
HCT: 34.6 % — ABNORMAL LOW (ref 39.0–52.0)
MCHC: 31.8 g/dL (ref 30.0–36.0)
Platelets: 455 10*3/uL — ABNORMAL HIGH (ref 150–400)
RDW: 15.3 % (ref 11.5–15.5)

## 2010-12-10 LAB — BASIC METABOLIC PANEL
BUN: 44 mg/dL — ABNORMAL HIGH (ref 6–23)
Creatinine, Ser: 2.4 mg/dL — ABNORMAL HIGH (ref 0.4–1.5)
GFR calc non Af Amer: 29 mL/min — ABNORMAL LOW (ref >60)
Glucose, Bld: 113 mg/dL — ABNORMAL HIGH (ref 70–99)
Potassium: 4.2 mEq/L (ref 3.5–5.1)

## 2010-12-10 LAB — GLUCOSE, CAPILLARY
Glucose-Capillary: 112 mg/dL — ABNORMAL HIGH (ref 70–99)
Glucose-Capillary: 117 mg/dL — ABNORMAL HIGH (ref 70–99)

## 2010-12-10 NOTE — Op Note (Signed)
NAMEKNOLAN, SIMIEN                  ACCOUNT NO.:  [id number]  MEDICAL RECORD NO.:  [id number]           PATIENT TYPE:  I  LOCATION:  2301                         FACILITY:  [facility]  PHYSICIAN:  Evelene Croon, M.D.     DATE OF BIRTH:  [birth year]  DATE OF PROCEDURE:  12/05/2010 DATE OF DISCHARGE:                              OPERATIVE REPORT   PREOPERATIVE DIAGNOSIS:  Severe three-vessel coronary artery disease with severe left ventricular dysfunction.  POSTOPERATIVE DIAGNOSIS:  Severe three-vessel coronary artery disease with severe left ventricular dysfunction.  OPERATIVE PROCEDURE:  Median sternotomy, extracorporeal circulation, coronary artery bypass graft surgery x6 using a sequential left internal mammary artery graft of the mid and distal LAD, a sequential saphenous vein graft to the first, second and third obtuse marginal branches of the left circumflex coronary artery, and a saphenous vein graft to the distal right coronary artery.  Endoscopic vein harvesting from the left leg.  ATTENDING SURGEON:  Evelene Croon, MD  ASSISTANT:  Rowe Clack, PA-C  ANESTHESIA:  General endotracheal.  CLINICAL HISTORY:  This patient is a 50 year old gentleman with history of obesity, diabetes for about 10 years, and hypertension who presented with progressive fluid retention with anasarca.  His ejection fraction by echocardiogram was found to be 20-25% with grade 3 diastolic dysfunction.  Cardiac catheterization showed severe three-vessel coronary artery disease with an occluded LAD proximally with faint segmental filling of the diagonal, which was small as well as the mid-to- distal LAD.  There is a dominant left circumflex with large bifurcating obtuse marginal and had 80-90% stenosis and diffuse disease.  There is 70-80% distal left circumflex stenosis.  The right coronary artery was occluded in the midportion with faint collaterals to the distal vessel. PA pressure was elevated at  66/38 with an RV pressure of 63/24, a right atrial pressure of 20-24, and a pulmonary capillary wedge pressure of 38.  The patient was also found to have renal insufficiency with a creatinine of about 1.6 and 1.8 on admission.  This increased to about 2.4 after catheterization and diuresis.  It was felt that coronary artery bypass graft surgery is the best treatment to prevent further ischemia, infarction, and improve his quality of life.  Due to his elevated creatinine, we decided to wait for come back down.  He continued to improve and was diuresing well and his creatinine gradually improved to around 2.0.  I discussed the operative procedure and coronary artery bypass surgery with he and his wife.  We discussed alternatives, benefits, and risks including but not limited to bleeding, blood transfusion, infection, stroke, myocardial infarction, graft failure, and death.  We also discussed the possibility of postoperative acute renal failure and possible need for temporary permanent dialysis. I discussed the higher risk nature of the surgery given his poor ejection fraction and told them that I was not sure if this would improve his ejection fraction postoperatively.  I told them that I thought it would at least preserve his remaining viable myocardium and prevent further infarction in the near future.  Understood all of this and agreed to proceed.  OPERATIVE PROCEDURE:  The patient was taken to the operating room and placed on the table in supine position.  After induction of general endotracheal anesthesia, a Foley catheter was placed in the bladder using sterile technique.  Then, the chest, abdomen and both lower extremities were prepped and draped in usual sterile manner.  A transesophageal echocardiogram was performed by Anesthesiology, this showed severe left ventricular dysfunction with a dilated left ventricle.  Ejection fraction was about 28%.  The septum as well as anterior  wall were akinetic.  The lateral wall was hypokinetic.  There was mild mitral regurgitation with a structurally normal mitral valve. The right ventricle also appeared mildly dilated.  The left atrium and right atrium were both dilated.  Then, the chest was opened through a median sternotomy incision.  The pericardium was opened in the midline.  Examination of the heart showed good right ventricular contractility.  The ascending aorta was of normal size and had no palpable plaques in it.  Surprisingly, there was very little fat on the surface of the heart.  Then, the left internal mammary artery was harvested from the chest wall as a pedicle graft.  This was a large caliber vessel with excellent blood flow through it.  At the same time, a segment of greater saphenous vein was harvested from the left leg using endoscopic vein harvest technique.  The same was a medium size and good quality.  We initially examined the vein in the right leg adjacent to the knee and this vein at this level was small and there were actually two small veins of equal caliber in the lower leg.  Therefore, we did not harvest them on the right side and one to the left side.  Then, the patient was heparinized when an adequate ACT was obtained. The distal ascending aorta was cannulated using a 20-French aortic cannula for arterial inflow.  Venous outflow was achieved using a two- stage venous cannula for the right atrial appendage.  An antegrade cardioplegia and vent cannula was inserted in the aortic root.  The patient was placed on cardiopulmonary bypass and distal coronary arteries were identified.  He had diffuse coronary plaque.  The LAD was severely diseased.  There was one small area in the midportion as well as one small area distally that appeared soft enough to open.  The anterior wall had patchy scar throughout, although there appeared to be some viable myocardium.  The diagonal branch was a small  nongraftable vessel.  The left circumflex had three marginal branches as seen on catheterization.  All three of these were visible quite proximally where there were heavily diseased, but then became intramyocardial.  I was able to locate these within the muscle and found areas that were softer, but still somewhat diseased and suitable for grafting.  Then, the right coronary artery was examined.  This was also diffusely diseased.  It was a nondominant vessel that gave off a moderate-sized acute marginal branch that was diffusely diseased and not graftable.  The right coronary artery terminated as a smaller branch in the inferior wall. The distal right coronary artery was suitable for grafting.  Then, a retrograde cardioplegic cannula was inserted through a pursestring suture on the right atrium and advanced in the coronary sinus without difficulty.  The aorta was crossclamped and 800 mL of cold blood antegrade cardioplegia was administered in the aortic root with quick arrest of the heart.  This was followed by 500 mL of cold blood retrograde cardioplegia.  Systemic hypothermia to 32 degrees centigrade and topical hypothermic with iced saline was used.  A temperature probe was placed in the septum, which was obviously scar and difficult to insert a septal probe into.  Topical hypothermic with iced saline was used.  Insulating pad was placed in the pericardium.  The first distal anastomosis was performed to the first marginal branch. The internal diameter of this vessel was about 1.6 mm.  The conduit used was a segment of greater saphenous vein and the anastomosis was performed in a sequential side-to-side manner using continuous 7-0 Prolene suture.  Flow was noted through the graft and was excellent.  Second distal anastomosis was performed to the second marginal branch. The internal diameter of his vessel was about 1.75 mm.  The conduit used was a same segment of greater saphenous vein  and the anastomosis was performed in a sequential side-to-side manner using continuous 7-0 Prolene suture.  Flow was noted through the graft and was excellent.  The third distal anastomosis was performed to the third marginal branch. The internal diameter of this vessel was about 1.75 mm.  The conduit used was a same segment of greater saphenous vein and the anastomosis was performed in a sequential end-to-side manner using continuous 7-0 Prolene suture.  Flow was noted through the graft and was excellent. Then, another dose of cardioplegia was given down this vein graft and in the aortic root.  The fourth distal anastomosis was performed to the distal right coronary artery.  The internal diameter of this vessel was 1.6 mm.  The conduit used was a second segment of greater saphenous vein and the anastomosis was performed in a end-to-side manner using continuous 7-0 Prolene suture.  Flow was noted through the graft and was good.  The fifth distal anastomosis was then performed to the mid LAD.  The vessel was opened here in the internal diameter was about 1.6 mm.  A 1 mm probe would passed approximately for about 2 cm and then distally for about 2 cm.  Given the diffuse disease in the LAD, and the fact this vessel coursed around the apex to supply the distal inferior wall, I decided to try to graft it sequentially into the mid and distal portion. The conduit used for the mid LAD was the left internal mammary graft, this was brought through an opening of the left pericardium anterior to the phrenic nerve.  It was anastomosed to the LAD in a sequential side- to-side manner using continuous 8-0 Prolene suture.  Flow was checked down the graft and appeared good.  The sixth distal anastomosis was performed to the distal LAD.  The internal diameter here was about 1.5 mm.  The conduit used was the same left internal mammary graft and this was anastomosed in a sequential end- to-side manner  using continuous 8-0 Prolene suture.  The pedicle was then sutured to the epicardium with 6-0 Prolene sutures at both anastomotic sites.  Then, another dose of cold blood antegrade cardioplegia was given.  With the crossclamp in place, two proximal vein graft anastomoses were performed in the mid ascending aorta in end-to- side manner using continuous 6-0 Prolene suture.  Then, the clamps were removed from the mammary artery pedicle.  The crossclamp was removed. There was spontaneous return of sinus rhythm.  The patient was rewarmed to 37 degrees centigrade.  The proximal and distal anastomoses appeared hemostatic and allowed the graft satisfactory.  Graft markers were placed around the proximal anastomoses.  Two temporary right  ventricular and right atrial pacing wires were placed and brought through the skin.  When the patient was rewarmed to 37 degrees centigrade, he was weaned from cardiopulmonary bypass on low-dose dopamine and milrinone.  Total bypass time was 151 minutes.  Cardiac function was evaluated with echocardiogram and showed improvement in contractility of the anterolateral wall.  Lateral contractility was also improved.  There was some inferior wall contractility.  There was trivial mitral regurgitation.  Right heart function appeared improved.  Then, protamine was given and venous and aortic cannulas were removed without difficulty.  Hemostasis was achieved.  Cardiac output remained good throughout the postbypass at about 4.5-5.5 liters per minute.  Then, three chest tubes were placed with to tube in the posterior pericardium, one in the left pleural space and one in the anterior mediastinum. Sternum was reapproximated with double #6 stainless steel wires.  Fascia was closed with continuous #1 Vicryl suture.  The subcutaneous tissue was closed with continuous 2-0 Vicryl and the skin with a 3-0 Vicryl subcuticular closure.  The lower extremity vein harvest site was  closed in layers in a similar manner.  The sponge, needle and instrument counts were correct according to the scrub nurse.  Dry sterile dressing was applied over the incisions around the chest tubes which were hooked to Pleur-Evac suction.  The patient remained hemodynamically stable and was transferred to the SICU in guarded, but stable condition.     Evelene Croon, M.D.     BB/MEDQ  D:  12/05/2010  T:  12/06/2010  Job:  [id number]  cc:   Peter M. [place], M.D.  Electronically Signed by Evelene Croon M.D. on 12/10/2010 01:33:59 PM

## 2010-12-10 NOTE — Consult Note (Signed)
James [name], CAPPELLETTI NO.:  [id number]  MEDICAL RECORD NO.:  [id number]           PATIENT TYPE:  LOCATION:                                 FACILITY:  PHYSICIAN:  Evelene Croon, M.D.     DATE OF BIRTH:  09-03-1961  DATE OF CONSULTATION:  11/27/2010 DATE OF DISCHARGE:                                CONSULTATION   REFERRING PHYSICIAN:  Peter M. Swaziland, MD  REASON FOR CONSULTATION:  Severe multivessel coronary artery disease with severe left ventricular dysfunction and congestive heart failure.  CLINICAL HISTORY:  I was asked by Dr. Swaziland to evaluate James [name] for consideration of coronary artery bypass graft surgery.  He is a 50 year old gentleman with a history of type 2 diabetes, hypertension, and obesity who presents with a several-week history of a progressive lower extremity swelling and more recent increase in abdominal girth.  He said he did not notice that his weight was going up that much because he has been trying to lose weight by watching his diet.  He was found to have a BNP of 1800 and with his anasarca was admitted for treatment of decompensated congestive heart failure.  An echocardiogram was performed which showed an ejection fraction of 20-25% with diffuse hypokinesis. There is grade 3 diastolic dysfunction.  There is mild to moderate mitral regurgitation.  There is a mildly dilated to severely dilated left atrium and mildly dilated right atrium.  Pulmonary artery systolic pressure was estimated at 44.  He was treated with diuresis with a good response.  His creatinine was 1.76 on admission.  Cardiology evaluation was obtained.  The patient underwent cardiac catheterization yesterday which showed PA pressure of 66/38 with a mean of 49.  Pulmonary capillary wedge pressure was 38.  Right ventricular pressure was 63/24 with a right atrial pressure of 20-24.  There is no gradient across the aortic valve.  Pulmonary artery saturation was 65%.  His  cardiac index was 2.9 by Fick and 2.04 by thermodilution.  Coronary angiography showed severe multivessel disease.  The LAD was totally occluded proximally with faint delayed filling of a diagonal branch and possibly the LAD distally by collaterals.  The left main was calcified without significant narrowing.  The left circumflex gave off a proximal marginal that bifurcated.  The marginal had about 80% stenosis before the bifurcation and then the superior subbranch had 90% stenosis.  The inferior subbranch had about 70-80% stenosis.  His vessels had diffuse diabetic-type disease.  The AV groove portion of the circumflex had 70% and 70-80% stenosis more distally.  The right coronary artery was severely diseased with tandem 80 and 90% stenoses and then total occlusion in the midportion.  The distal vessel did not fill well by collaterals with faint visualization of an acute marginal or posterior descending branch.  The left ventriculography was not performed due to elevated creatinine and elevated filling pressures.  REVIEW OF SYSTEMS:  GENERAL:  He denies any fever or chills.  He has noted significant weight loss over the past 6 months or so, but he has been trying to lose  weight.  More recently, he began gaining weight. Before his swelling started, he was about 275 pounds and said that he was in the mid 290s at the time of presentation.  He does report fatigue.  HEENT:  Eyes negative.  ENT negative.  ENDOCRINE:  He does have adult-onset diabetes which he has been trying to keep under control.  He said he was recently noted to have an elevated blood sugar and glipizide was added to his medical regimen.  He denies hypothyroidism.  CARDIOVASCULAR:  He denies any chest pain or pressure. He denies PND and orthopnea.  He has had peripheral edema and abdominal swelling as noted above.  He denies palpitations.  RESPIRATORY:  He did have some cough that was nonproductive in late December and was  treated with a Z-Pak.  The cough continued.  He denies any hemoptysis. GASTROINTESTINAL:  He denies any nausea or vomiting.  He has had reduced appetite recently.  He denies melena and bright red blood per rectum. GENITOURINARY:  He denies dysuria and hematuria.  MUSCULOSKELETAL:  He denies myalgias.  He recently began having pain in his right knee and ankle related to gout.  NEUROLOGIC:  He denies any focal weakness or numbness.  Denies dizziness and syncope.  He has never had TIA or stroke.  ALLERGIES:  None.  PSYCHIATRIC:  Negative.  HEMATOLOGIC: Negative.  PAST MEDICAL HISTORY:  Significant for type 2 diabetes.  He has a history of hypertension.  He has a history of gout.  He has a history of obesity.  MEDICATIONS PRIOR TO ADMISSION:  As noted on his admission medicine reconciliation form.  SOCIAL HISTORY:  He lives in [place], [place], with his wife.  He works as an Education officer, community.  He has one child.  He is a nonsmoker.  He drinks an occasional beer.  FAMILY HISTORY:  Positive for hypertension, diabetes.  PHYSICAL EXAMINATION:  VITAL SIGNS:  His blood pressure is 101/80, pulse 87 and regular, respiratory rate is 18 and unlabored, oxygen saturation on room air is 98%. GENERAL:  He is an obese [place] male, in no distress. HEENT:  Normocephalic and atraumatic.  Pupils are equal and reactive to light and accommodation.  Extraocular muscles are intact.  His oropharynx is clear. NECK:  Normal carotid pulses bilaterally.  There are no bruits.  There is no adenopathy or thyromegaly. CARDIAC:  Regular rate and rhythm with normal S1 and S2.  There is no murmur, rub, or gallop. LUNGS:  Clear. ABDOMEN:  Active bowel sounds.  His abdomen is soft, obese, and nontender.  There are no palpable masses or organomegaly. EXTREMITIES:  Moderate edema to the thigh.  Pedal pulses are palpable bilaterally. SKIN:  Warm and dry. NEUROLOGIC:  Alert and oriented x3.  Motor  and sensory exams grossly normal.  LABORATORY EXAMINATION:  Today shows a low sodium of 132, glucose of 172, BUN of 29, creatinine of 1.69.  His white blood cell count is 11.7, hemoglobin 11.8, platelet count 370,000.  His hemoglobin A1c on admission was 10.3 with a mean plasma glucose of 249.  TSH was normal. Cardiac enzymes were negative.  His liver function profile is within normal limits.  Albumin was low at 2.4.  Chest x-ray showed mild cardiomegaly and pulmonary vascular congestion. Electrocardiogram shows sinus rhythm with right bundle-branch block and left posterior fascicular block.  There are Q-waves anteriorly.  IMPRESSION:  Mr. Lata has severe multivessel coronary artery disease with severe left ventricular dysfunction presenting with decompensated  congestive heart failure.  He has never had any anginal symptoms.  He still has a large amount of myocardial territory at risk with high-grade stenoses in his dominant left circumflex coronary system.  It is unclear whether he will have any viable myocardium in the left anterior descending territory.  His right coronary artery was occluded but appears to be a relatively small nondominant vessel.  I agree with Dr. [place], coronary bypass graft surgery is still the best option for revascularization to try to improve his ejection fraction and prevent any further myocardial damage.  His operative risk is increased due to low ejection fraction, as well as diabetes, hypertension, and chronic kidney disease.  I discussed the operative procedure with him and his wife, including alternatives, benefits, and risks, including but not limited to, bleeding, blood transfusion, infection, stroke, myocardial infarction, graft failure, worsening of his renal failure, possibly requiring dialysis, heart block requiring permanent pacemaker, and persistence of congestive heart failure postoperatively.  I also discussed the possibility of difficulty  with weaning from cardiopulmonary bypass given his severe left ventricular dysfunction and possible need for an intra-aortic balloon pump or ventricular assist device.  They understand all this and would like to proceed with surgery if we feel that that will have the best chance of improving his situation.  We will follow his progress over the next day or so and decide when to proceed with surgery depending on his renal function and congestive heart failure.     Evelene Croon, M.D.     BB/MEDQ  D:  11/27/2010  T:  11/28/2010  Job:  [id number]  cc:   Peter M. [place], M.D.  Electronically Signed by Evelene Croon M.D. on 12/10/2010 01:33:51 PM

## 2010-12-11 LAB — BASIC METABOLIC PANEL
Calcium: 9 mg/dL (ref 8.4–10.5)
Chloride: 103 mEq/L (ref 96–112)
Creatinine, Ser: 2.29 mg/dL — ABNORMAL HIGH (ref 0.4–1.5)
GFR calc Af Amer: 37 mL/min — ABNORMAL LOW (ref >60)

## 2010-12-11 LAB — GLUCOSE, CAPILLARY
Glucose-Capillary: 115 mg/dL — ABNORMAL HIGH (ref 70–99)
Glucose-Capillary: 139 mg/dL — ABNORMAL HIGH (ref 70–99)

## 2010-12-12 LAB — GLUCOSE, CAPILLARY: Glucose-Capillary: 137 mg/dL — ABNORMAL HIGH (ref 70–99)

## 2010-12-12 NOTE — Procedures (Signed)
NAMEREINALDO, HELT                  ACCOUNT NO.:  [id number]  MEDICAL RECORD NO.:  [id number]           PATIENT TYPE:  LOCATION:                                 FACILITY:  PHYSICIAN:  Arturo Morton. Riley Kill, MD, FACCDATE OF BIRTH:  [birth year]  DATE OF PROCEDURE: DATE OF DISCHARGE:                           CARDIAC CATHETERIZATION   INDICATIONS:  Mr. Keech is a 50 year old who presents with cardiomyopathy of severe degree.  He was seen in consultation.  He has multiple risk factors including diabetes, hypertension.  He has some renal insufficiency.  Importantly, his creatinine is increased slightly with diuresis from 1.5-1.67.  He was gently hydrated in the lab today and plans were made for a limited contrast study with right heart catheterization.  Risks and benefits were discussed with the patient and he has been seen also by Dr. Swaziland and Dr. Gala Romney and this was discussed as well.  The patient was brought to the lab for further evaluation.  PROCEDURES: 1. Right left heart catheterization 2. Selective coronary arteriography.  DESCRIPTION OF PROCEDURE:  The patient was brought to the Catheterization Laboratory and prepped and draped in the usual fashion. Through an anterior puncture, the femoral vein was entered and a 7- [place] sheath was placed.  Thermodilution Swan-Ganz catheter was utilized for right heart catheterization.  Saturations were obtained in the superior vena cava and the pulmonary artery.  The serial right heart pressures were recorded.  Thermodilution cardiac outputs were performed. Following this, the right femoral artery was entered using a Smart needle as well and a 5-French sheath was placed.  The central aortic and left ventricular pressures were recorded.  Following that, simultaneous pressures were recorded as well.  Following the pullback, the pigtail catheter was removed.  A single shot was obtained in the right coronary artery.  This was followed by  limited shots of the left coronary artery. Total contrast load was less than 20 mL total.  The patient tolerated the procedure well and was taken to the holding area in satisfactory clinical condition for sheath removal.  I also discussed the findings with his wife in detail.  HEMODYNAMIC DATA: 1. Right atrial pressure 22-24. 2. RV 63-24. 3. Pulmonary artery 66/38, mean 49. 4. Pulmonary capillary wedge 38. 5. LV 141/22/38. 6. Aortic 143/103, mean 122. 7. Superior vena cava saturation 69%. 8. Pulmonary artery saturation 65%. 9. Aortic saturation 97%. 10.Fick cardiac output 7.2 liters per minute. 11.Fick cardiac index 2.9 liters per minute per meter squared. 12.Thermodilution cardiac output 5.09 liters per minute. 13.Thermodilution cardiac index 2.04 liters per minute per meter     squared.  ANGIOGRAPHIC DATA: 1. On plain fluoroscopy, there is a fairly extensive calcification of     the coronary arteries. 2. The left main is calcified, but without critical narrowing.  There     is mild luminal irregularity. 3. The LAD is totally occluded.  On delayed filling, there is some     delayed collateralization of the LAD, but the entire extent of the     LAD is not well seen.  Faint portions of the midvessel are filled  in. 4. The circumflex consists of a proximal marginal and an AV     circumflex.  The proximal marginal has a subbranch which is tiny     and severely diseased.  The marginal itself has about an 80%     stenosis, then divides distally with a superior subbranch of 90%     and inferior subbranch of 70 with tandem lesions of 50 and 80 below     this.  It has a typical diabetic appearance.  The AV circumflex has     70% and then 70-80% stenosis more distally.  There is a subbranch     that has 90% that goes posteriorly.  These branches do appear to be     potentially amenable to revascularization. 5. The right coronary artery is severely disease with tandem 80 and     90%  lesions and total occlusion.  Distal vessel does not fill in     well either by left-to-right collaterals nor by right-to-right     collaterals.  There is faint visualization of the acute marginal     branch.  CONCLUSIONS: 1. Severe ischemic cardiomyopathy with total occlusion of the left     anterior descending, total occlusion of the right coronary artery,     and severe disease involving the remaining circumflex vessel. 2. Severe reduction in left ventricular function by echocardiography. 3. Pulmonary hypertension due to left ventricular failure. 4. Elevated pulmonary capillary wedge. 5. Diabetes. 6. Hypertension. 7. Possible sleep apnea. 8. Chronic kidney disease, stage III.  DISPOSITION:  We will review the findings with Dr. Swaziland. Consideration might be given to an MRI to look for viability.  In the absence of any viability, then likely ICD placement would be warranted. With viability, then some consideration towards revascularization surgery might be considered, but it has to be remember that it targets distally for the LAD on right in particular poor.     Arturo Morton. Riley Kill, MD, [facility]     TDS/MEDQ  D:  11/26/2010  T:  11/27/2010  Job:  [id number]  cc:   Peter M. Swaziland, M.D. CV Laboratory  Electronically Signed by Shawnie Pons MD [facility] on 12/12/2010 09:12:52 PM

## 2010-12-13 ENCOUNTER — Encounter: Payer: Self-pay | Admitting: Cardiology

## 2010-12-13 ENCOUNTER — Inpatient Hospital Stay (HOSPITAL_COMMUNITY): Payer: BC Managed Care – HMO

## 2010-12-13 LAB — BASIC METABOLIC PANEL
CO2: 27 mEq/L (ref 19–32)
Chloride: 100 mEq/L (ref 96–112)
Creatinine, Ser: 2.07 mg/dL — ABNORMAL HIGH (ref 0.4–1.5)
GFR calc Af Amer: 42 mL/min — ABNORMAL LOW (ref >60)
Potassium: 4.8 mEq/L (ref 3.5–5.1)

## 2010-12-13 LAB — GLUCOSE, CAPILLARY
Glucose-Capillary: 165 mg/dL — ABNORMAL HIGH (ref 70–99)
Glucose-Capillary: 166 mg/dL — ABNORMAL HIGH (ref 70–99)

## 2010-12-17 ENCOUNTER — Encounter (INDEPENDENT_AMBULATORY_CARE_PROVIDER_SITE_OTHER): Payer: BC Managed Care – HMO | Admitting: Cardiology

## 2010-12-17 ENCOUNTER — Encounter: Payer: Self-pay | Admitting: Cardiology

## 2010-12-17 DIAGNOSIS — I5022 Chronic systolic (congestive) heart failure: Secondary | ICD-10-CM

## 2010-12-17 DIAGNOSIS — I2589 Other forms of chronic ischemic heart disease: Secondary | ICD-10-CM | POA: Insufficient documentation

## 2010-12-17 DIAGNOSIS — I10 Essential (primary) hypertension: Secondary | ICD-10-CM | POA: Insufficient documentation

## 2010-12-17 DIAGNOSIS — E663 Overweight: Secondary | ICD-10-CM | POA: Insufficient documentation

## 2010-12-17 DIAGNOSIS — M109 Gout, unspecified: Secondary | ICD-10-CM | POA: Insufficient documentation

## 2010-12-17 DIAGNOSIS — N259 Disorder resulting from impaired renal tubular function, unspecified: Secondary | ICD-10-CM | POA: Insufficient documentation

## 2010-12-17 DIAGNOSIS — Z951 Presence of aortocoronary bypass graft: Secondary | ICD-10-CM | POA: Insufficient documentation

## 2010-12-17 DIAGNOSIS — E871 Hypo-osmolality and hyponatremia: Secondary | ICD-10-CM | POA: Insufficient documentation

## 2010-12-17 DIAGNOSIS — E119 Type 2 diabetes mellitus without complications: Secondary | ICD-10-CM | POA: Insufficient documentation

## 2010-12-17 DIAGNOSIS — R609 Edema, unspecified: Secondary | ICD-10-CM | POA: Insufficient documentation

## 2010-12-17 NOTE — H&P (Signed)
NAMECADON, RACZKA NO.:  [id number]  MEDICAL RECORD NO.:  [id number]           PATIENT TYPE:  E  LOCATION:  [facility]                         FACILITY:  [facility]  PHYSICIAN:  Eduard Clos, MDDATE OF BIRTH:  1961/08/20  DATE OF ADMISSION:  11/21/2010 DATE OF DISCHARGE:                             HISTORY & PHYSICAL   PRIMARY CARE PHYSICIAN:  At [place].  CHIEF COMPLAINT:  Increasing lower extremity swelling.  HISTORY OF PRESENT ILLNESS:  A 50 year old male with chronic lower extremity cellulitis and hypertension, gout, diabetes mellitus type 2. Has been experiencing increasing swelling of the lower extremity and last 2 days increased abdominal girth.  The patient did not have any chest pain or shortness of breath.  Did not have any nausea, vomiting, or abdominal pain.  Did not have any fever, chills, any dysuria, discharge, or diarrhea.  The patient states that 2 weeks ago he did have syncopal episode and at that time his blood sugar was found to be high and his primary care physician added glipizide to his regular medications.  Since then his blood sugars are more better controlled. He did not have any further syncopal episode.  The patient at this time is more concerned about his increasing abdominal girth with increasing swelling.  At this time, the patient is found to have a BNP of 1800. The patient has been admitted for anasarca, probably from decompensated CHF.  PAST MEDICAL HISTORY: 1. Hypertension. 2. Diabetes mellitus type 2. 3. Gout. 4. Chronic edema. 5. Obesity.  MEDICATIONS PRIOR TO ADMISSION: 1. Cinnamon tart cherry over the counter. 2. Indocin 25 mg p.o. daily as needed. 3. Diovan HCT 320/12.5 p.o. daily. 4. Albuterol 300 mg p.o. three times daily. 5. Glipizide 5 mg p.o. daily. 6. Metformin 1000 mg p.o. twice daily.  ALLERGIES:  No known drug allergies.  FAMILY HISTORY:  Positive for diabetes and hypertension.  SOCIAL HISTORY:   The patient works as an Secondary school teacher.  Denies smoking cigarette, drinking alcohol, or using illegal drugs.  REVIEW OF SYSTEMS:  As per history of present illness, nothing else significant.  PHYSICAL EXAMINATION:  GENERAL:  The patient examined at bedside, not in acute distress. VITAL SIGNS:  Blood pressure 138/102, when the patient came initially it was 164/120, pulse 110 per minute, temperature 97.6, respiratory rate 18, O2 sat 99%. HEENT:  Anicteric.  No pallor.  No discharge from ears, eyes, nose, or mouth. CHEST:  Bilateral air entry present.  No rhonchi, no crepitation. HEART:  S1 and S2 heard. ABDOMEN:  Soft, nontender.  Bowel sounds heard.  It is distended. CNS:  Alert, awake, and oriented to time, place, and person.  Moves upper and lower extremities. EXTREMITIES:  There is  3+ edema extending all the way up to the mid abdomen.  Pulses are not felt, but there is no acute ischemic changes, clubbing, or cyanosis.  There is also swelling of left hand, which he says is from a recent gout attack.  He is able to flex and extend that without any acute tenderness.  Pulses are felt.  LABORATORY DATA:  CBC; WBC  6.7, hemoglobin 12.6, hematocrit 38.7, platelets 403.  Comprehensive metabolic panel; sodium 136, potassium 5, chloride 104, carbon dioxide 23, glucose 169, BUN 30, creatinine 1.6, we do not have baseline creatinine, total bilirubin is 0.4, alk phos 121, AST 23, ALT 23, troponin is 6.2, albumin 2.5, calcium 9.3.  BNP 1826. EKG just ordered and pending.  Chest x-ray, mild cardiomegaly and pulmonary vascular congestion.  No acute findings.  ASSESSMENT: 1. Anasarca, probably from decompensated diastolic heart failure. 2. Increased creatinine, we do not have the baseline creatinine. 3. Hypoalbuminemia.  We need to rule out any cirrhosis. 4. Uncontrolled hypertension. 5. Diabetes mellitus type 2. 6. Morbid obesity. 7. Gout. 8. Recent syncope.  PLAN: 1. At this time, we will  admit the patient to telemetry. 2. For his fluid overload, at this time we are going to place the     patient on 60 mg IV Lasix q.6 x3 dose and further dose is to be     addressed based on his intake output and creatinine and blood     pressure and we will also place the patient on nitroglycerin paste     and we will continue his present Diovan/HCT along with labetalol. 3. The patient does have mild hypoalbuminemia.  We will check sonogram     to make sure there is no cirrhosis and also the patient has     increased creatinine.  We will check UA to look for any cast or     proteinuria. 4. We will get a Doppler of the lower extremities as the patient has     chronic edema. 5. For hypertension, we will continue his present medication. 6. For diabetes, at this time we will place the patient on CBG     coverage with sensitive sliding scale.  We will check hemoglobin     A1c. 7. We will get a 2-D echo. 8. Consult Cardiology in a.m. 9. The patient is a full code. 10.Further recommendation pending clinic course, test order, and     consult recommendations.     Eduard Clos, MD     ANK/MEDQ  D:  11/22/2010  T:  11/22/2010  Job:  [id number]  Electronically Signed by Midge Minium MD on 12/17/2010 05:02:38 PM

## 2010-12-18 ENCOUNTER — Encounter: Payer: Self-pay | Admitting: Cardiology

## 2010-12-18 NOTE — Cardiovascular Report (Signed)
Summary: Cardiac Catheterization  Cardiac Catheterization   Imported By: Dorise Hiss 12/14/2010 16:40:06  _____________________________________________________________________  External Attachment:    Type:   Image     Comment:   External Document

## 2010-12-18 NOTE — Op Note (Signed)
Summary: Operative Report/ [facility]  Operative Report/ [facility]   Imported By: Dorise Hiss 12/14/2010 17:02:38  _____________________________________________________________________  External Attachment:    Type:   Image     Comment:   External Document

## 2010-12-18 NOTE — Consult Note (Signed)
Summary: Consultation Report/ Evelene Croon  Consultation Report/ [facility]   Imported By: Dorise Hiss 12/14/2010 16:45:31  _____________________________________________________________________  External Attachment:    Type:   Image     Comment:   External Document

## 2010-12-18 NOTE — Consult Note (Signed)
Summary: Consultation Report/ cardiology  Consultation Report/ cardiology   Imported By: Dorise Hiss 12/14/2010 16:42:51  _____________________________________________________________________  External Attachment:    Type:   Image     Comment:   External Document

## 2010-12-18 NOTE — Cardiovascular Report (Signed)
Summary: Cardiac Cath Other/ PROGRESS NOTE  Cardiac Cath Other/ PROGRESS NOTE   Imported By: Dorise Hiss 12/14/2010 16:58:08  _____________________________________________________________________  External Attachment:    Type:   Image     Comment:   External Document

## 2010-12-19 ENCOUNTER — Ambulatory Visit (INDEPENDENT_AMBULATORY_CARE_PROVIDER_SITE_OTHER): Payer: BC Managed Care – HMO | Admitting: Cardiology

## 2010-12-19 ENCOUNTER — Encounter: Payer: Self-pay | Admitting: Physician Assistant

## 2010-12-19 DIAGNOSIS — R609 Edema, unspecified: Secondary | ICD-10-CM

## 2010-12-20 ENCOUNTER — Encounter: Payer: Self-pay | Admitting: Physician Assistant

## 2010-12-20 ENCOUNTER — Encounter: Payer: Self-pay | Admitting: Cardiology

## 2010-12-24 ENCOUNTER — Telehealth (INDEPENDENT_AMBULATORY_CARE_PROVIDER_SITE_OTHER): Payer: Self-pay | Admitting: *Deleted

## 2010-12-25 ENCOUNTER — Telehealth: Payer: Self-pay | Admitting: Cardiology

## 2010-12-25 ENCOUNTER — Encounter: Payer: Self-pay | Admitting: Cardiology

## 2010-12-27 NOTE — Assessment & Plan Note (Signed)
Summary: Post CABG per Dr. Riley Kill patient has to be seen on Monday-wi...   Visit Type:  Follow-up Primary Provider:  Dr. Michel Santee  CC:  CAD.  History of Present Illness: The patient presents for followup after her recent bypass surgery. He has been home for only 4 days. Unfortunately in that time, probably related to Lasix he developed severe lower extremity gout. He still able to ambulate but is in quite a bit of pain. His lower extremity swelling as well her on 40 mg of Lasix. However, labs were drawn today and his sodium is now down to 1.8. BUN and creatinine are elevated at 46 and 2.52. He denies any chest pressure, neck or arm discomfort. He's not having any excessive incisional discomfort. His edema is improved though he still has at least moderate edema to his knees. He is trying to keep his feet elevated but not quite as high as I would suggest. He was not sent home with compression stockings. He is limiting salt and restricting fluids.  Preventive Screening-Counseling & Management  Alcohol-Tobacco     Smoking Status: never  Current Medications (verified): 1)  Allopurinol 100 Mg Tabs (Allopurinol) .... Take 1 Tablet By Mouth Once A Day 2)  Aspirin 325 Mg Tabs (Aspirin) .... Take 1 Tablet By Mouth Once A Day 3)  Carvedilol 12.5 Mg Tabs (Carvedilol) .... Take 1 Tablet By Mouth Two Times A Day 4)  Digoxin 0.125 Mg Tabs (Digoxin) .... Take 1 Tablet By Mouth Once A Day 5)  Furosemide 40 Mg Tabs (Furosemide) .... Take 1 Tablet By Mouth Once A Day 6)  Glipizide 5 Mg Tabs (Glipizide) .... Take 1 Tablet By Mouth Two Times A Day 7)  Oxycodone-Acetaminophen 5-500 Mg Caps (Oxycodone-Acetaminophen) .... Take One Tab Every 4-6 Hrs As Needed 8)  Crestor 40 Mg Tabs (Rosuvastatin Calcium) .... Take 1 Tab By Mouth At Bedtime 9)  Cinnamon 500 Mg Caps (Cinnamon) .... Take 1 Tablet By Mouth Once A Day 10)  Tart Cherry Advanced  Caps (Misc Natural Products) .... Take 1 Tablet By Mouth Once A Day For  Gout  Allergies (verified): No Known Drug Allergies  Past History:  Past Medical History:  1. Hypertension.   2. Diabetes mellitus type 2.   3. Gout.   4. Chronic edema.   5. Obesity.   6. CAD  7. Ischemic CM (EF 25%)     Past Surgical History: CABG. (Sequential left internal mammary artery graft to the mid and     distal LAD, sequential saphenous vein graft to the first, second, and third     obtuse marginal branches of the left circumflex coronary artery.     saphenous vein graft to the distal right coronary artery.)    Review of Systems       As stated in the HPI and negative for all other systems.   Vital Signs:  Patient profile:   50 year old male Weight:      253.38 pounds Pulse rate:   98 / minute BP sitting:   115 / 77  (right arm) Cuff size:   regular  Vitals Entered By: Hoover Brunette, LPN (December 17, 2010 4:02 PM) CC: CAD Is Patient Diabetic? Yes Comments post hosp - CABG   Physical Exam  General:  Well developed, well nourished, in no acute distress. Head:  normocephalic and atraumatic Mouth:  Teeth, gums and palate normal. Oral mucosa normal. Neck:  No JVD at 90 degrees Chest Wall:  Well-healed sternotomy scar  Lungs:  Clear bilaterally to auscultation and percussion. Abdomen:  Positive bowel sounds no rebound, guarding, no organomegaly Msk:  Back normal, normal gait. Muscle strength and tone normal. Extremities:  Moderate edema to the knees Neurologic:  Alert and oriented x 3. Skin:  Intact without lesions or rashes. Cervical Nodes:  no significant adenopathy Psych:  Normal affect.   Detailed Cardiovascular Exam  Neck    Carotids: No JVD distention 90    Neck Veins: Normal, no JVD.    Heart    Inspection: no deformities or lifts noted.      Palpation: normal PMI with no thrills palpable.      Auscultation: regular rate and rhythm, S1, S2 without murmurs, rubs, gallops, or clicks.    Vascular    Abdominal Aorta: Unable appreciate a  seated    Pedal Pulses: Unable to appreciate pedal pulses with moderate edema    Radial Pulses: normal radial pulses bilaterally.      Peripheral Circulation: no clubbing, cyanosis, or edema noted with normal capillary refill.     Impression & Recommendations:  Problem # 1:  CORONARY ARTERY BYPASS GRAFT, HX OF (ICD-V45.81) The patient is not having significant sternal pain. He said the shortness of breath. Continue with risk reduction.  Problem # 2:  HYPONATREMIA (ICD-276.1) This to me is the most pressing problem. I think this is likely related to the Lasix that he needs to stop this. He needs to have his basic metabolic profile checked tomorrow. He needs to reduce to 1000 cc fluid restriction. To treat his edema he will wear the knee-high compression stockings that he has but has not been wearing. He will keep his feet elevated above his heart to light discussed with he and his wife at length the need to be moving about or to move his legs to avoid any blood clotting. We will need to see him in 2 days. If her labs were his clinical situation deteriorate he is to present immediately to the emergency room. If he is not better in 2 days he knows we will admit him for medical management. Orders: T-Basic Metabolic Panel [phone])  Problem # 3:  RENAL INSUFFICIENCY (ICD-588.9) Again we will watch this closely. His BUN and creatinine today are above his discharge but consistent with previous levels seen in the hospital.  Problem # 4:  GOUT, UNSPECIFIED (ICD-274.9) He will not be taking allopurinol. I will give him one dose of 0.3 mg colchicine which we have used carefully given his renal insufficiency. Stopping the Lasix may be the most important. I did give him Vicodin to see if this helps better with pain.  Problem # 5:  DM (ICD-250.00) he is due to see his primary care physician's as he is not well-controlled.  Problem # 6:  ISCHEMIC CARDIOMYOPATHY (ICD-414.8) I will titrate his meds over  time.  Patient Instructions: 1)  Follow up with Dr. Diona Browner on Wed, December 19, 2010 at 2:40pm. 2)  Your physician has requested that you limit your fluid intake to    per day. No more than 1000 mL per day.  3)  Have home health draw lab work tomorrow. 4)  Stop Lasix (furosemide) 5)  Wear compression stockings. 6)  Elevate legs. 7)  Take colchicine 0.6mg  1/2 tablet one time only, Prescriptions: COLCRYS 0.6 MG TABS (COLCHICINE) Take 1/2 tablet by mouth one dose.  #1 x 0   Entered by:   Cyril Loosen, RN, BSN   Authorized by:   Fayrene Fearing  Antoine Poche, MD, [facility]   Signed by:   Cyril Loosen, RN, BSN on 12/17/2010   Method used:   Electronically to        [facility]  [address] [phone]* (retail)       [address]       [place], [place]  [address]       Ph: [phone]       Fax: [phone]   RxID:   [id number] VICODIN 5-500 MG TABS (HYDROCODONE-ACETAMINOPHEN) Take 2 tablets by mouth every 8 hours as needed pain.  #60 x 0   Entered by:   Cyril Loosen, RN, BSN   Authorized by:   Rollene Rotunda, MD, [facility]   Signed by:   Cyril Loosen, RN, BSN on 12/17/2010   Method used:   Handwritten   RxID:   [id number]  I have reviewed and approved all prescriptions at the time of this visit. Rollene Rotunda, MD, [facility]  December 17, 2010 5:55 PM

## 2010-12-27 NOTE — Assessment & Plan Note (Signed)
Summary: F/U OV W/DR. HOCHREIN ON 2/27-JM   Visit Type:  Follow-up Primary Provider:  Dr. Michel Santee   History of Present Illness: patient presents for early 2 day followup, per Dr. Jenene Slicker  request.  Patient recently presented here for his initial visit, after undergoing CABG at [facility]. Unfortunately, he developed acute gout, which he had experienced remotely, and which was most likely related to Lasix. In fact, he tells me today that he had symptoms even during his recent hospitalization, while on this medication. Dr. Antoine Poche stopped the Lasix and allopurinol, and gave a one time dose of colchicine (half tablet). He added compression stockings for the LE edema, restricted fluids to 1 L per day, and instructed to maintain legs elevated. Followup labs were ordered for yesterday: sodium improved from 128 to 130, potassium stable at 4.3, a creatinine increase from 2.5 to 2.9. BUN also increased from 46-60.  Clinically, he is tolerating the pain, on Vicodin. He continues to report no chest pain, or symptoms suggestive of decompensated heart failure. He is sleeping in a recliner, so as to maintain his legs elevated. His wife notes that the urine color is much better, and more normal, suggesting that he was previously dehydrated on Lasix. In fact, he has lost 2 pounds in the last 48 hours.  Patient has scheduled a follow up with his primary care doctor this coming Monday, 2/3, for further recommendations and management of his acute/chronic gout.  Preventive Screening-Counseling & Management  Alcohol-Tobacco     Smoking Status: never  Current Medications (verified): 1)  Aspirin 325 Mg Tabs (Aspirin) .... Take 1 Tablet By Mouth Once A Day 2)  Carvedilol 12.5 Mg Tabs (Carvedilol) .... Take 1 Tablet By Mouth Two Times A Day 3)  Digoxin 0.125 Mg Tabs (Digoxin) .... Take 1 Tablet By Mouth Once A Day 4)  Glipizide 5 Mg Tabs (Glipizide) .... Take 1 Tablet By Mouth Two Times A Day 5)  Crestor 40 Mg  Tabs (Rosuvastatin Calcium) .... Take 1 Tab By Mouth At Bedtime 6)  Cinnamon 500 Mg Caps (Cinnamon) .... Take 1 Tablet By Mouth Once A Day 7)  Tart Cherry Advanced  Caps (Misc Natural Products) .... Take 1 Tablet By Mouth Once A Day For Gout 8)  Vicodin 5-500 Mg Tabs (Hydrocodone-Acetaminophen) .... Take 2 Tablets By Mouth Every 8 Hours As Needed Pain.  Allergies (verified): No Known Drug Allergies  Past History:  Past Medical History: Last updated: 12/17/2010  1. Hypertension.   2. Diabetes mellitus type 2.   3. Gout.   4. Chronic edema.   5. Obesity.   6. CAD  7. Ischemic CM (EF 25%)     Review of Systems       No fevers, chills, hemoptysis, dysphagia, melena, hematocheezia, hematuria, rash, claudication, orthopnea, or pnd. All other systems negative.   Vital Signs:  Patient profile:   50 year old male Weight:      251 pounds Pulse rate:   89 / minute BP sitting:   116 / 78  (right arm) Cuff size:   regular  Vitals Entered By: Hoover Brunette, LPN (December 19, 2010 3:01 PM) Is Patient Diabetic? Yes Comments follow up from [facility]   Physical Exam  Additional Exam:  GEN: 50 year old male, obese, sitting a wheelchair, in no distress HEENT: NCAT,PERRLA,EOMI NECK: palpable pulses, no bruits; no JVD; no TM LUNGS: CTA bilaterally HEART: RRR (S1S2); no significant murmurs; no rubs; no gallops ABD: soft, NT; intact BS EXT:3 patent 4+  bilateral LE edema SKIN: well healed midline incision MUSC: no obvious deformity NEURO: A/O (x3)     Impression & Recommendations:  Problem # 1:  GOUT, UNSPECIFIED (ICD-274.9)  stable on pain medication. Lasix, allopurinol have been discontinued. Patient took one dose of half tablet called to scene, and is scheduled to f/u with his primary M.D., on Monday, 2/3. Will continue current conservative measures, allowing for gentle hydration. Of note, did not recommend using indomethacin, which he has used in the past, given that he is on full  dose aspirin.  Problem # 2:  RENAL INSUFFICIENCY (ICD-588.9)  have ordered repeat labs for tomorrow, to be drawn stat by [facility], with results forwarded to our office for review. If creatinine continues to rise, we'll strongly consider immediate hospitalization for acute management of worsening renal failure. Of note, will also discontinue digoxin, in this context, and would not resume, unless renal function completely normalizes. Will also arrange for early clinic followup with Dr. Antoine Poche.  Problem # 3:  CORONARY ARTERY BYPASS GRAFT, HX OF (ICD-V45.81)  clinically stable, following recent CABG. Is scheduled to see Dr. Laneta Simmers in the next few weeks.  Problem # 4:  ISCHEMIC CARDIOMYOPATHY (ICD-414.8)  clinically stable, with no suggestion of decompensated heart failure. In fact, patient is reporting good diuresis, off Lasix, and has lost 2 pounds over the last 2 days.  Other Orders: T-Basic Metabolic Panel [phone]) T-BNP  (B Natriuretic Peptide) [phone]) T-Uric Acid (Blood) [phone])  Patient Instructions: 1)  Follow up with Dr. Antoine Poche on in [place] on January 02, 2011 AT 10:45AM. 2)  Have home health nurse draw labs tomorrow. 3)  Stop Digoxin

## 2010-12-27 NOTE — Discharge Summary (Signed)
James [name], James [name] NO.:  [id number]  MEDICAL RECORD NO.:  [id number]           PATIENT TYPE:  I  LOCATION:  2013                         FACILITY:  [facility]  PHYSICIAN:  Evelene Croon, M.D.     DATE OF BIRTH:  [birth year]  DATE OF ADMISSION:  11/21/2010 DATE OF DISCHARGE:  12/13/2010                              DISCHARGE SUMMARY   HISTORY:  The patient is a 50 year old black male with a history of multiple medical diagnoses.  Recently, he was seen at urgent care due to experiencing increase in swelling in his lower extremities over the 2 days prior to admission as well as what he felt was increase in his abdominal birth.  The patient did not have any chest pain or shortness of breath.  He did not have nausea, vomiting, or abdominal pain.  He did not have fevers, chills, dysuria, or diarrhea.  The patient did state that approximately 2 weeks previous he had a syncopal episode, at which time his blood sugar was found to be high and his primary care physician added glipizide to his other medications.  Since that time, he states that his blood sugars have been under better control.  The patient was found to have findings consistent with congestive heart failure, and he was subsequently transferred from the urgent care center to the emergency department at [facility].  He was then seen by the Hospitalist and initial assessment included anasarca felt to be related to decompensated diastolic heart failure.  Additionally, his creatinine was noted to be elevated to 1.6.  His blood pressure was poorly controlled, and he would require admission for further evaluation and treatment.  Past medical history includes: 1. Hypertension. 2. Diabetes mellitus type 2. 3. Gout. 4. Chronic edema. 5. Obesity.  MEDICATIONS PRIOR TO ADMISSION: 1. Indocin 25 mg p.r.n. 2. Diovan HCT 320/12.5 mg daily. 3. Labetalol 300 mg t.i.d. 4. Glipizide 5 mg daily. 5. Metformin 1 g  twice daily. 6. Over-the-counter cinnamon supplemented. 7. Over-the-counter cherry tart supplement.  ALLERGIES:  No known drug allergies.  FAMILY HISTORY:  Remarkable for diabetes and hypertension.  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  He denies cigarette use.  He denies alcohol use.  He denies illicit drug use.  For review of symptoms and physical exam, please see the history and physical done at the time of admission.  HOSPITAL COURSE:  The patient was admitted to telemetry.  He was started on a course of intravenous diuretics.  He was felt to require cardiology evaluation for congestive heart failure.  He was felt to have significant evidence of acute systolic and diastolic congestive failure, ischemic versus nonischemic cardiomyopathy.  He would require further evaluation to include cardiac catheterization which was done on November 26, 2010.  He was found to have significantly elevated pulmonary artery pressures of 66/38 with a mean of 49.  Capillary wedge pressure was 38, right ventricular pressure was 63/24, and right atrial pressure was 20- 24.  There was no gradient across the aortic valve.  Pulmonary artery saturation was  65%.  His cardiac index was 2.9 by Fick and 2.04 by thermodilution.  Coronary angiography showed severe multivessel disease. The LAD was totally occluded proximally with faint delayed filling of a diagonal branch and possibly the LAD distally by collaterals.  The left main was calcified without significant narrowing.  The left circumflex gave off a proximal marginal that bifurcated.  The marginal had about an 80% stenosis before the bifurcation and then the superior subbranch had 90% stenosis.  The inferior branch had about a 70% to 80% stenosis.  His vessels had diffuse diabetic type disease.  The AV groove portion of the circumflex had 70% to 80% stenosis more distally.  The right coronary artery was  severely disease with tandem 80% and 90% stenosis and total occlusion in the midportion.  The distal vessel did not fill well by collaterals with faint visualization with an acute marginal or posterior descending branch.  The left ventriculography was not performed to the elevated creatinine and elevated filling pressures.  The patient was felt to be a possible candidate for surgical revascularization, and cardiac surgical consultation was obtained with Evelene Croon, MD, who evaluated the patient and studies.  He discussed surgical revascularization with the patient to be high risk, but probably his best option at this point as far as therapy.  He continued to be medically stabilized and the procedure was subsequently scheduled, and on December 05, 2010, he underwent the following procedure, coronary artery bypass grafting x6.  The following grafts were placed: 1. A sequential left internal mammary artery graft to the mid and     distal LAD . 2. A sequential saphenous vein graft to the first, second, and third     obtuse marginal branches of the left circumflex coronary artery. 3. A saphenous vein graft to the distal right coronary artery.  The patient tolerated the procedure well, was taken to the surgical intensive care unit in serious condition.  POSTOPERATIVE HOSPITAL COURSE:  The patient has overall progressed nicely.  Initially, he did require inotropic support but this was able to be weaned without significant difficulties.  He does have an acute blood loss anemia.  His values have stabilized.  His most recent hemoglobin and hematocrit dated December 10, 2010, are 11 and 34 respectively.  His creatinine has had an acute exacerbation of chronic renal insufficiency.  His peak creatinine was 2.46 and it is improving significantly overtime with ongoing diuresis.  His most recent BUN and creatinine dated December 13, 2010, are 39 and 2.07 respectively.  From a cardiac rhythm  viewpoint, the patient has had postoperative ventricular ectopy including bigeminy.  His beta-blocker has been titrated.  Pending his further recovery, following his surgical revascularization, this will need to be reevaluated for consideration of need for AICD as he has significant impairment with ejection fraction in the 25% range.  The patient's diabetes has been under adequate control using usual postoperative protocols.  Some consideration was made as to whether he would need insulin at home, but at this time his sugars are under fairly good control on oral regimen.  Of note, his Glucophage has not been restarted due to the fact that he has had at this exacerbation of his renal insufficiency.  His oxygen has been weaned and he maintains good saturations on room air.  He is ambulating quite well in the hallways.  His incisions are healing without evidence of infection.  He is tolerating diet.  He continues to have significant postoperative volume overload but this  is felt to have been improved over time and he will continue diuretics as an outpatient.  Currently, he is felt to be stable for discharge on today's date, December 13, 2010.  Medications at discharge include the following: 1. Allopurinol 100 mg daily. 2. Aspirin 325 mg enteric-coated tablet daily. 3. Coreg 12.5 mg p.o. b.i.d. 4. Digoxin 0.125 mg daily. 5. Glipizide 5 mg p.o. b.i.d. 6. Oxycodone 5 mg IR tablet 1-2 every 4-6 hours as needed for pain. 7. Crestor 40 mg daily. 8. Over-the-counter cinnamon supplement. 9. Over-the-counter cherry supplement p.r.n.  Currently, he is not felt to be a candidate to restart his metformin or his Indocin or his Diovan/HCT due to his renal impairment.  CONDITION ON DISCHARGE:  Stable and improved.  Final diagnoses include the following:  Severe ischemic cardiomyopathy with multivessel coronary artery disease and left ventricular impairment with ejection fraction approximately 25%.   Now status post surgical revascularization as described.  Other diagnoses include postoperative volume overload and postoperative acute blood loss anemia, preoperative pulmonary hypertension, diabetes mellitus type 2, hypertension, possible sleep apnea, history of chronic lower extremity cellulitis, history of gout, history of obesity.  INSTRUCTIONS:  The patient received written instructions in regard to medications, activity, diet, wound care, and followup.  Followup will include BMET to be checked on December 17, 2010, and then appointment to see Dr. Antoine Poche at 3:45 p.m.  He is also instructed to follow up with his primary care physician in regard to his long-term diabetes management.  He is to see Dr. Laneta Simmers on January 01, 2011, at 12:15 with a chest x-ray at that time.     Rowe Clack, P.A.-C.   ______________________________ Evelene Croon, M.D.    Sherryll Burger  D:  12/13/2010  T:  12/14/2010  Job:  [id number]  cc:   Rollene Rotunda, MD, [facility]  Electronically Signed by Gershon Crane P.A.-C. on 12/18/2010 10:40:44 AM Electronically Signed by Evelene Croon M.D. on 12/26/2010 11:31:27 AM

## 2010-12-27 NOTE — Miscellaneous (Signed)
Summary: Home Care Report/ [facility]  Home Care Report/ [facility]   Imported By: Dorise Hiss 12/18/2010 14:57:58  _____________________________________________________________________  External Attachment:    Type:   Image     Comment:   External Document

## 2010-12-27 NOTE — Miscellaneous (Signed)
Summary: Home Care Report/ [facility]  Home Care Report/ [facility]   Imported By: Dorise Hiss 12/20/2010 14:37:46  _____________________________________________________________________  External Attachment:    Type:   Image     Comment:   External Document

## 2010-12-31 ENCOUNTER — Other Ambulatory Visit: Payer: Self-pay | Admitting: Surgery

## 2010-12-31 DIAGNOSIS — I2510 Atherosclerotic heart disease of native coronary artery without angina pectoris: Secondary | ICD-10-CM

## 2011-01-01 ENCOUNTER — Ambulatory Visit
Admission: RE | Admit: 2011-01-01 | Discharge: 2011-01-01 | Disposition: A | Discharge status: Home / Self Care | Payer: BC Managed Care – HMO | Source: Ambulatory Visit | Attending: Surgery | Admitting: Surgery

## 2011-01-01 ENCOUNTER — Encounter (INDEPENDENT_AMBULATORY_CARE_PROVIDER_SITE_OTHER): Payer: Self-pay | Admitting: Surgery

## 2011-01-01 DIAGNOSIS — I2510 Atherosclerotic heart disease of native coronary artery without angina pectoris: Secondary | ICD-10-CM

## 2011-01-01 NOTE — Progress Notes (Signed)
Summary: Gout  Phone Note Other Incoming Call back at [facility] Phone [phone]   Summary of Call: Home health nurse, Lennox Laity, called stating pt is in severe pain from Gout. She would like to know if there is anything the patient can do. She states his primary MD is out of town this week.  Initial call taken by: Cyril Loosen, RN, BSN,  December 25, 2010 2:43 PM  Follow-up for Phone Call        Pt called the office after being seen by nurse. He states he is still losing weight and passing fluid. He states nurse told him that edema is better and lungs are clear. He states he is trying to be active by doing leg lifts in chair. He states if he needs to he can "toughen it out" regarding gout pain. His number is [phone]. Follow-up by: Cyril Loosen, RN, BSN,  December 25, 2010 4:33 PM  Additional Follow-up for Phone Call Additional follow up Details #1::        If his pain is intractable suggest an urgent care or whomever is covering for his primary MD for treatment of his gout.  Make sure he has his follow up labs. Additional Follow-up by: Rollene Rotunda, MD, [facility],  December 25, 2010 5:43 PM    Additional Follow-up for Phone Call Additional follow up Details #2::    Left message to call back on pt's voicemail. Cyril Loosen, RN, BSN  December 26, 2010 9:54 AM  Discussed with pt who states his gout pain is actually a little better today.  Follow-up by: Cyril Loosen, RN, BSN,  December 26, 2010 10:12 AM

## 2011-01-01 NOTE — Progress Notes (Signed)
Summary: Needs Labs  ---- Converted from flag ---- ---- 12/23/2010 4:35 PM, Rollene Rotunda, MD, [facility] wrote: Please make sure that he has labs mid week.  BMET. ------------------------------  Spoke with Selena Batten at [facility]. Pt will have labs tomorrow at scheduled visit.

## 2011-01-02 ENCOUNTER — Encounter: Payer: Self-pay | Admitting: Cardiology

## 2011-01-02 ENCOUNTER — Ambulatory Visit (INDEPENDENT_AMBULATORY_CARE_PROVIDER_SITE_OTHER): Payer: BC Managed Care – HMO | Admitting: Cardiology

## 2011-01-02 DIAGNOSIS — I2510 Atherosclerotic heart disease of native coronary artery without angina pectoris: Secondary | ICD-10-CM

## 2011-01-02 DIAGNOSIS — I5022 Chronic systolic (congestive) heart failure: Secondary | ICD-10-CM

## 2011-01-02 NOTE — Assessment & Plan Note (Signed)
OFFICE VISIT  James, [name] DOB:  10-01-1961                                        January 01, 2011 CHART #:  [id number]  The patient returned to my office today for a followup, status post coronary artery bypass graft surgery x6 on December 05, 2010.  He has known low ejection fraction of 20-25% with grade 3 diastolic dysfunction preoperatively as well as chronic renal insufficiency with a creatinine about 1.6-1.8 on admission.  His creatinine had increased to about 2.4 after catheterization and was back down to about 2.0 at the time I operate on him.  His creatinine roused postoperatively, but it has gradually decreased again.  His wife said that his most recent creatinine was around 1.38.  He has been on a fluid restriction at home at the request of Dr. Antoine Poche and he is on 1000 mL per day fluid restriction.  He said he feels thirsty all the time and wants to liberalize that.  His main complaint at this time is of severe gout involving his knees and ankles in particular.  He has been unable to walk and is getting around a wheelchair.  He has a history of gout and had fairly severe acute attack at the time of admission.  This improved with colchicine and allopurinol and allowed [place] to ambulate him postoperatively.  He had been taking some tart cherry tablets for gout previously which he said always controlled his gout.  His wife feels that his acute gout flare is probably related to some relative dehydration from fluid restriction.  He denies any chest pain or pressure.  He denies any shortness of breath.  On physical examination, his blood pressure is 117/79, pulse 92 and regular, respiratory rate is 20 and unlabored, oxygen saturation on room air is 98%.  He looks uncomfortable.  Cardiac exam shows regular rate and rhythm with normal heart sounds.  Lung exam is clear.  The chest incision is healing well and the sternum is stable.  His leg incision  is healing well.  There is moderate edema in the lower legs and feet.  Followup chest x-ray today shows clear lung fields and no pleural effusions.  He has some chronic elevation of his left hemidiaphragm that was present preoperatively.  His medications are: 1. Aspirin 325 mg daily. 2. Coreg 12.5 mg b.i.d. 3. Glipizide 5 mg b.i.d. 4. Crestor 40 mg daily. 5. Cinnamon and cherry supplements over-the-counter. 6. Vicodin 5/500 q.6 h. p.r.n. for pain. 7. He had been on digoxin previously, but this has been discontinued.  IMPRESSION:  The patient is recovering fairly well following his surgery with the exception of his acute gout attack.  He cannot take Indocin due to his chronic renal insufficiency, although his creatinine has improved to 1.38.  I have decided to treat him acutely with colchicine and wrote her a prescription for 1.2 mg x1 followed by 0.6 mg 1 hour later.  If he has not improved in the next 24 hours, he will take 0.6 mg twice a day until improved.  Also, started him on allopurinol 100 mg daily.  I told him to hold off on take any further Crestor until we get his gout under control due to the potential interaction of colchicine and Crestor.  I told him he could liberalize his fluid intake somewhat, but ask him to  try to keep it to about 1500-2000 mL per day.  He has a followup appointment tomorrow with Dr. Antoine Poche.  I told him he did not need to return to see me unless he was having problems with his incision.  Dr. Antoine Poche and his primary physician can follow up on his gout.  Evelene Croon, M.D. Electronically Signed  BB/MEDQ  D:  01/01/2011  T:  01/02/2011  Job:  [id number]  cc:   Rollene Rotunda, MD, [facility]

## 2011-01-08 NOTE — Letter (Signed)
Summary: [facility] Discharge Summary  [facility] Discharge Summary   Imported By: Earl Many 12/28/2010 09:48:20  _____________________________________________________________________  External Attachment:    Type:   Image     Comment:   External Document

## 2011-01-08 NOTE — Assessment & Plan Note (Signed)
Summary: ov. per [facility] office [phone]. gd   Visit Type:  Follow-up Primary Provider:  Dr. Michel Santee  CC:  CAD and Ischemic CM.  History of Present Illness: The patient presents for followup of his ischemic cardiomyopathy. Since I last saw him we have followed his labs closely and he has been seen once by our PA. Thankfully his creatinine and sodium had improved. He has still been having problems with gout or just yesterday was started back on colchicine which I had used only at a very low dose with his renal insufficiency. His lower extremity swelling has gone down and his weight has gone down. He is not having any acute shortness of breath, PND or orthopnea. He is still having difficulty walking and weakness. He does have a small right lower leg ulcer.  This is being addressed by the home health nurse.  Current Medications (verified): 1)  Aspirin 325 Mg Tabs (Aspirin) .... Take 1 Tablet By Mouth Once A Day 2)  Carvedilol 12.5 Mg Tabs (Carvedilol) .... Take 1 Tablet By Mouth Two Times A Day 3)  Glipizide 5 Mg Tabs (Glipizide) .... Take 1 Tablet By Mouth Two Times A Day 4)  Crestor 40 Mg Tabs (Rosuvastatin Calcium) .... Hold 5)  Cinnamon 500 Mg Caps (Cinnamon) .... Take 1 Tablet By Mouth Once A Day 6)  Tart Cherry Advanced  Caps (Misc Natural Products) .... Take 1 Tablet By Mouth Once A Day For Gout 7)  Vicodin 5-500 Mg Tabs (Hydrocodone-Acetaminophen) .... Take 2 Tablets By Mouth Every 8 Hours As Needed Pain. 8)  Allopurinol 100 Mg Tabs (Allopurinol) .Marland Kitchen.. 1 By Mouth Daily 9)  Colcrys 0.6 Mg Tabs (Colchicine) .Marland Kitchen.. 1 By Mouth Daily  Allergies (verified): No Known Drug Allergies  Past History:  Past Medical History:  1. Hypertension.   2. Diabetes mellitus type 2.   3. Gout.   4. Chronic edema.   5. Obesity.   6. CAD  7. Ischemic CM (EF 25%)  8. CRI     Past Surgical History: Reviewed history from 12/17/2010 and no changes required. CABG. (Sequential left internal mammary artery  graft to the mid and     distal LAD, sequential saphenous vein graft to the first, second, and third     obtuse marginal branches of the left circumflex coronary artery.     saphenous vein graft to the distal right coronary artery.)   Review of Systems       As stated in the HPI and negative for all other systems.   Vital Signs:  Patient profile:   50 year old male Height:      72 inches Weight:      234 pounds BMI:     31.85 Pulse rate:   94 / minute Resp:     16 per minute BP sitting:   98 / 64  (left arm)  Vitals Entered By: Marrion Coy, CNA (January 02, 2011 10:52 AM)  Physical Exam  General:  Well developed, well nourished, in no acute distress. Head:  normocephalic and atraumatic Mouth:  Teeth, gums and palate normal. Oral mucosa normal. Neck:  No JVD at 90 degrees Chest Wall:  Well-healed sternotomy scar Lungs:  Clear bilaterally to auscultation and percussion. Abdomen:  Positive bowel sounds no rebound, guarding, no organomegaly Pulses:  Unable to appreciate pedal pulses with moderate edema Extremities:  Moderate edema to the knees (decreased from previous) Neurologic:  Alert and oriented x 3. Skin:  Bandaged right leg ulcer Cervical Nodes:  no significant adenopathy Psych:  Normal affect.   Detailed Cardiovascular Exam  Neck    Carotids: No JVD distention 90    Neck Veins: Normal, no JVD.    Heart    Inspection: no deformities or lifts noted.      Palpation: normal PMI with no thrills palpable.      Auscultation: regular rate and rhythm, S1, S2 without murmurs, rubs, gallops, or clicks.    Vascular    Abdominal Aorta: Unable appreciate a seated    Pedal Pulses: Unable to appreciate pedal pulses with moderate edema    Radial Pulses: normal radial pulses bilaterally.      Peripheral Circulation: no clubbing, cyanosis, or edema noted with normal capillary refill.     Impression & Recommendations:  Problem # 1:  ISCHEMIC CARDIOMYOPATHY  (ICD-414.8) I'm glad to see that he is sodium and creatinine are improved.  For now I cannot titrate his meds further as his blood pressure would not allow. I will liberalize his fluid restriction slightly.  He needs to continue to keep his legs elevated. I have written a new prescription for thigh high compression stockings.  Problem # 2:  CORONARY ARTERY BYPASS GRAFT, HX OF (ICD-V45.81) We will continue with risk reduction. He will hold off on cardiac rehabilitation and now until he is more mobile.  Problem # 3:  RENAL INSUFFICIENCY (ICD-588.9) This is improved. No further labs are indicated today.  Problem # 4:  GOUT, UNSPECIFIED (ICD-274.9) I agree now to his renal function has improved enough to allow the use of colchicine  Patient Instructions: 1)  Your physician recommends that you schedule a follow-up appointment in: 6 weeks with Dr Antoine Poche 2)  Your physician recommends that you continue on your current medications as directed. Please refer to the Current Medication list given to you today.

## 2011-02-12 ENCOUNTER — Telehealth: Payer: Self-pay | Admitting: Cardiology

## 2011-02-12 NOTE — Telephone Encounter (Signed)
Spoke with pt who reports wt is 234 lbs today. Yesterday wt was 232 lbs. He does not have list of weights prior to this but states it is usually in 231-232 lb range.  Pt was seen by [facility] Nurse yesterday and he states his blood pressure was 116/68. Pt wearing compression hose on left leg but not right due to ulcer on right which is being treated.  No increase in swelling or shortness of breath noted.  I instructed pt to continue to monitor weights daily and to let us know if weight continued to increase from 234 lbs or if he noted any shortness of breath, swelling or other problems.  Pt. Aware of scheduled follow up appt. With Dr. Antoine Poche on Mar 01, 2011.

## 2011-02-14 ENCOUNTER — Telehealth: Payer: Self-pay | Admitting: Cardiology

## 2011-02-14 NOTE — Telephone Encounter (Signed)
Per pt call - states his wt is up to 234 lbs from 231 lbs on Monday however he is not SOB and has no CP or edema.  BP was 116/68 today.  He states he is on a strict no added salt diet and also a fluid restriction.  Pt taking ASA 325 mg a day, Carvedilol 12.5 mg one twice a day and Glucophage.  Pt is concerned about the wt gain and wants Dr Antoine Poche to know about it.  I instructed pt that I will notify MD and call him back with any orders.  Again he reports no s/s. Does not really want to have to take Furosemide because of the gout it gave him.

## 2011-02-15 NOTE — Telephone Encounter (Signed)
Pt aware OK to monitor over the weekend - no changes at this time.  He will call back on Monday if wt changes

## 2011-02-15 NOTE — Telephone Encounter (Signed)
OK to monitor over the weekend.

## 2011-02-28 ENCOUNTER — Encounter: Payer: Self-pay | Admitting: Cardiology

## 2011-03-01 ENCOUNTER — Ambulatory Visit (INDEPENDENT_AMBULATORY_CARE_PROVIDER_SITE_OTHER): Payer: BC Managed Care – HMO | Admitting: Cardiology

## 2011-03-01 ENCOUNTER — Encounter: Payer: Self-pay | Admitting: Cardiology

## 2011-03-01 DIAGNOSIS — Z951 Presence of aortocoronary bypass graft: Secondary | ICD-10-CM

## 2011-03-01 DIAGNOSIS — N259 Disorder resulting from impaired renal tubular function, unspecified: Secondary | ICD-10-CM

## 2011-03-01 DIAGNOSIS — I2589 Other forms of chronic ischemic heart disease: Secondary | ICD-10-CM

## 2011-03-01 DIAGNOSIS — I10 Essential (primary) hypertension: Secondary | ICD-10-CM

## 2011-03-01 DIAGNOSIS — E871 Hypo-osmolality and hyponatremia: Secondary | ICD-10-CM

## 2011-03-01 MED ORDER — TORSEMIDE 10 MG PO TABS: ORAL_TABLET | ORAL | Status: DC

## 2011-03-01 MED ORDER — LISINOPRIL 5 MG PO TABS: 2.5000 mg | ORAL_TABLET | Freq: Every day | ORAL | Status: DC

## 2011-03-01 NOTE — Patient Instructions (Signed)
Start lisinopril 2.5 mg daily  Take Torsemide 10 mg a day for 3 days and then for weight gain Weigh daily Have blood work in 7 days (basic metabolic panel) Follow up in 1 month with Dr Antoine Poche

## 2011-03-01 NOTE — Assessment & Plan Note (Addendum)
We began to discuss the possibility of an ICD but he will need continued med titration and followup echocardiography before we get to this point.

## 2011-03-01 NOTE — Progress Notes (Signed)
HPI Patient presents for followup of his coronary disease and ischemic cardiomyopathy. Since I last saw him he has started to have some recovery though he still been bothered by gallop. He actually was so debilitated that he developed a pressure ulcer which developed gouty crystals. He is still having dyspnea with exertion though this is slowly improving. He is not describing any new PND or orthopnea. Is not having any chest discomfort, neck or arm discomfort. He has no new palpitations, presyncope or syncope. He continues to have lower extremity swelling and an 8 pound weight gain over one month. He still very weak. He is doing a little walking but has not yet join cardiac rehabilitation.  Allergies not on file  Current Outpatient Prescriptions  Medication Sig Dispense Refill  . aspirin 325 MG tablet Take 325 mg by mouth daily.        . carvedilol (COREG) 12.5 MG tablet Take 12.5 mg by mouth 2 (two) times daily with a meal.        . Cinnamon 500 MG capsule Take 500 mg by mouth daily.        . colchicine 0.6 MG tablet Take 0.6 mg by mouth daily.        Marland Kitchen glipiZIDE (GLUCOTROL) 5 MG tablet Take 5 mg by mouth 2 (two) times daily before a meal.        . Misc Natural Products (TART CHERRY ADVANCED PO) Take by mouth.        . rosuvastatin (CRESTOR) 40 MG tablet Take 40 mg by mouth daily.        Marland Kitchen DISCONTD: allopurinol (ZYLOPRIM) 100 MG tablet Take 100 mg by mouth daily.        Marland Kitchen DISCONTD: HYDROcodone-acetaminophen (VICODIN) 5-500 MG per tablet Take 1 tablet by mouth every 6 (six) hours as needed.          Past Medical History  Diagnosis Date  . Hypertension   . Diabetes mellitus   . Gout   . Chronic edema   . Obesity   . Coronary artery disease   . Ischemic cardiomyopathy     EF 25%  . CMI (chronic mesenteric ischemia)     Past Surgical History  Procedure Date  . Coronary artery bypass graft     Sequential left internal mammary graft to the mid and distal LAD, sequential sapenous vein  graft to the first, second, and third obutuse marginal branches  of the  left circumflex  coronary artery.  Saphenous vein graft to the distal right coronary artery.    ROS: As stated in the HPI and negative for all other systems.  PHYSICAL EXAM BP 120/90  Pulse 95  Ht 6' (1.829 m)  Wt 245 lb (111.131 kg)  BMI 33.23 kg/m2 General: Chronically ill-appearing, well nourished, in no acute distress.  Head: normocephalic and atraumatic  Mouth: Teeth, gums and palate normal. Oral mucosa normal.  Neck: JVD at 45 degrees moderate Chest Wall: Well-healed sternotomy scar  Lungs: Clear bilaterally to auscultation and percussion.  Abdomen: Positive bowel sounds no rebound, guarding, no organomegaly  Pulses: Unable to appreciate pedal pulses with moderate edema  Extremities: Moderate edema to the knees (decreased from previous)  Neurologic: Alert and oriented x 3.  Skin: Bandaged right leg ulcer  Cervical Nodes: no significant adenopathy  Psych: Normal affect.  Detailed Cardiovascular Exam   Heart  Inspection: no deformities or lifts noted.  Palpation: normal PMI with no thrills palpable.  Auscultation: regular rate and rhythm, S1,  S2 without murmurs, rubs, gallops, or clicks.  Pedal Pulses: Unable to appreciate pedal pulses with moderate edema  Radial Pulses: normal radial pulses bilaterally.   EKG:  Sinus rhythm, right bundle branch block, anteroseptal infarct, inferior infarct  ASSESSMENT AND PLAN

## 2011-03-01 NOTE — Assessment & Plan Note (Signed)
His sodium and his last creatinine were back to near baseline recently.. We will follow this as above.

## 2011-03-01 NOTE — Assessment & Plan Note (Signed)
Today I'm going to try to titrate his meds by adding lisinopril 2.5 mg daily. He has extra fluid and then going to give him torsemide 10 mg daily for 3 days. In going to be careful about this because of previous renal insufficiency and gout. I would check a basic metabolic profile in 7 days.

## 2011-03-01 NOTE — Assessment & Plan Note (Signed)
His blood pressure is being managed with titration as above.

## 2011-03-05 ENCOUNTER — Telehealth: Payer: Self-pay | Admitting: Cardiology

## 2011-03-05 MED ORDER — ROSUVASTATIN CALCIUM 40 MG PO TABS: 40.0000 mg | ORAL_TABLET | Freq: Every day | ORAL | Status: DC

## 2011-03-05 NOTE — Telephone Encounter (Signed)
Pt wondering if crestor has a generic if so wants to get a new rx if not already has a refill called in -[facility] [address] in [place]

## 2011-03-05 NOTE — Telephone Encounter (Signed)
LMOM for pt. No generic availabe yet. If he has any questions to call back.

## 2011-03-12 ENCOUNTER — Encounter: Payer: Self-pay | Admitting: Cardiology

## 2011-03-14 ENCOUNTER — Telehealth: Payer: Self-pay | Admitting: Cardiology

## 2011-03-14 NOTE — Telephone Encounter (Signed)
Pt wants to talk to dr hochrien nurse. Pt does not want to leave a message because pt states its personal and only wants to talk to [facility].

## 2011-03-14 NOTE — Telephone Encounter (Signed)
Pt states he forgot to ask at his appointment if it is OK for him to use Levitra 20 mg 1/2 tablet prn.  It was given to him by his primary care MD and he just wants to make sure it's OK before he uses it.

## 2011-03-15 NOTE — Telephone Encounter (Signed)
Pt aware I have not received an answer yet.  Will call him back when I do.

## 2011-03-15 NOTE — Telephone Encounter (Signed)
Pt wants to talk to James [name] pt states James [name] know why he is calling

## 2011-03-24 NOTE — Telephone Encounter (Signed)
OK to use Levitra.

## 2011-03-25 NOTE — Telephone Encounter (Signed)
Pt aware OK to use Levitra.

## 2011-04-01 ENCOUNTER — Encounter: Payer: Self-pay | Admitting: Cardiology

## 2011-04-01 ENCOUNTER — Ambulatory Visit (INDEPENDENT_AMBULATORY_CARE_PROVIDER_SITE_OTHER): Payer: BC Managed Care – HMO | Admitting: Cardiology

## 2011-04-01 DIAGNOSIS — R609 Edema, unspecified: Secondary | ICD-10-CM

## 2011-04-01 DIAGNOSIS — I2589 Other forms of chronic ischemic heart disease: Secondary | ICD-10-CM

## 2011-04-01 DIAGNOSIS — E663 Overweight: Secondary | ICD-10-CM

## 2011-04-01 DIAGNOSIS — I10 Essential (primary) hypertension: Secondary | ICD-10-CM

## 2011-04-01 DIAGNOSIS — M109 Gout, unspecified: Secondary | ICD-10-CM

## 2011-04-01 DIAGNOSIS — Z951 Presence of aortocoronary bypass graft: Secondary | ICD-10-CM

## 2011-04-01 MED ORDER — TORSEMIDE 10 MG PO TABS: 10.0000 mg | ORAL_TABLET | Freq: Every day | ORAL | Status: DC

## 2011-04-01 MED ORDER — LISINOPRIL 10 MG PO TABS: 10.0000 mg | ORAL_TABLET | Freq: Every day | ORAL | Status: DC

## 2011-04-01 NOTE — Patient Instructions (Signed)
Please increase Lisinopril to 10 mg a day Take Demadex 10 mg every day Continue all other medications as listed Please have a basic metabolic panel in 5 days See Dr Antoine Poche in 2 weeks

## 2011-04-01 NOTE — Assessment & Plan Note (Signed)
The patient understands the need to lose weight with diet and exercise. We have discussed specific strategies for this.  

## 2011-04-01 NOTE — Assessment & Plan Note (Signed)
This will be treated in the context of managing his cardiomyopathy.

## 2011-04-01 NOTE — Assessment & Plan Note (Signed)
I will eventually after med titration repeat an echo. I suspect his EF might still be low and we will be discussing or making plans for an ICD.

## 2011-04-01 NOTE — Progress Notes (Signed)
HPI The patient returns for followup of his cardiomyopathy and coronary disease. At the last visit I started a low dose of ACE inhibitor. I also gave him a couple of days of torsemide as he had increased weight. I checked his followup blood work and his creatinine was stable. I moving slowly as he has had hyponatremia and renal insufficiency. He did have an improvement in his urine output for the short period that he took a torsemide. However, his weights are creeping up. He has continued lower extremity swelling. He is not having any PND or orthopnea. He is not having any chest pressure, neck or arm discomfort. He said no fevers chills or cough. He has had a flare of gout in his left hand. He continues to treat her lower extremity ulcer with home health nursing and they had apparently noted gout crystals in this wound. He is starting to do a little more walking though he notices this fatigue syndrome.  No Known Allergies  Current Outpatient Prescriptions  Medication Sig Dispense Refill  . aspirin 325 MG tablet Take 325 mg by mouth daily.        . carvedilol (COREG) 12.5 MG tablet Take 12.5 mg by mouth 2 (two) times daily with a meal.        . Cinnamon 500 MG capsule Take 500 mg by mouth daily.        . colchicine 0.6 MG tablet Take 0.6 mg by mouth daily.        Marland Kitchen glipiZIDE (GLUCOTROL) 5 MG tablet Take 5 mg by mouth 2 (two) times daily before a meal.        . lisinopril (PRINIVIL,ZESTRIL) 5 MG tablet Take 0.5 tablets (2.5 mg total) by mouth daily.  30 tablet  11  . Misc Natural Products (TART CHERRY ADVANCED PO) Take by mouth.        . rosuvastatin (CRESTOR) 40 MG tablet Take 1 tablet (40 mg total) by mouth daily.  30 tablet  6  . torsemide (DEMADEX) 10 MG tablet Take one daily for 3 days then as needed for weigh gain  30 tablet  11    Past Medical History  Diagnosis Date  . Hypertension   . Diabetes mellitus   . Gout   . Chronic edema   . Obesity   . Coronary artery disease   . Ischemic  cardiomyopathy     EF 25%  . CMI (chronic mesenteric ischemia)     Past Surgical History  Procedure Date  . Coronary artery bypass graft     Sequential left internal mammary graft to the mid and distal LAD, sequential sapenous vein graft to the first, second, and third obutuse marginal branches  of the  left circumflex  coronary artery.  Saphenous vein graft to the distal right coronary artery.    ROS:  Erectile dysfunction.  Otherwise as stated in the HPI and negative for all other systems.  PHYSICAL EXAM BP 135/96  Pulse 82  Resp 18  Ht 6' (1.829 m)  Wt 248 lb (112.492 kg)  BMI 33.63 kg/m2General: Chronically ill-appearing, well nourished, in no acute distress.  Head: normocephalic and atraumatic  Mouth: Teeth, gums and palate normal. Oral mucosa normal.  Neck: JVD at 45 degrees moderate  Chest Wall: Well-healed sternotomy scar  Lungs: Clear bilaterally to auscultation and percussion.  Abdomen: Positive bowel sounds no rebound, guarding, no organomegaly  Pulses: Unable to appreciate pedal pulses with moderate edema  Extremities: Moderate edema to the knees  Neurologic: Alert and oriented x 3.  Skin: Bandaged right leg ulcer  Cervical Nodes: no significant adenopathy  Psych: Normal affect.  Detailed Cardiovascular Exam  Heart Inspection: no deformities or lifts noted.  Palpation: normal PMI with no thrills palpable.  Auscultation: regular rate and rhythm, S1, S2 without murmurs, rubs, gallops, or clicks.  Pedal Pulses: Unable to appreciate pedal pulses with moderate edema  Radial Pulses: normal radial pulses bilaterally.   ASSESSMENT AND PLAN

## 2011-04-01 NOTE — Assessment & Plan Note (Signed)
This is a very significant problem for this patient. I will have him see one of our rheumatologists to see if there is any further management for this.

## 2011-04-01 NOTE — Assessment & Plan Note (Signed)
Today on going to increase his lisinopril to 5 mg daily. On going to start him on torsemide 10 mg daily but am going to watch for a gout flare and renal insufficiency. A check his basic metabolic profile in about 4 days.

## 2011-04-04 ENCOUNTER — Telehealth: Payer: Self-pay | Admitting: Cardiology

## 2011-04-04 NOTE — Telephone Encounter (Signed)
Error

## 2011-04-05 ENCOUNTER — Telehealth: Payer: Self-pay | Admitting: Cardiology

## 2011-04-05 ENCOUNTER — Encounter: Payer: Self-pay | Admitting: Cardiology

## 2011-04-15 NOTE — Telephone Encounter (Signed)
crestor 40 mg,. [facility].

## 2011-04-16 ENCOUNTER — Encounter: Payer: Self-pay | Admitting: Cardiology

## 2011-04-16 ENCOUNTER — Ambulatory Visit (INDEPENDENT_AMBULATORY_CARE_PROVIDER_SITE_OTHER): Payer: BC Managed Care – HMO | Admitting: Cardiology

## 2011-04-16 DIAGNOSIS — E871 Hypo-osmolality and hyponatremia: Secondary | ICD-10-CM

## 2011-04-16 DIAGNOSIS — E782 Mixed hyperlipidemia: Secondary | ICD-10-CM

## 2011-04-16 DIAGNOSIS — Z951 Presence of aortocoronary bypass graft: Secondary | ICD-10-CM

## 2011-04-16 DIAGNOSIS — I10 Essential (primary) hypertension: Secondary | ICD-10-CM

## 2011-04-16 DIAGNOSIS — M109 Gout, unspecified: Secondary | ICD-10-CM

## 2011-04-16 DIAGNOSIS — R609 Edema, unspecified: Secondary | ICD-10-CM

## 2011-04-16 DIAGNOSIS — I2589 Other forms of chronic ischemic heart disease: Secondary | ICD-10-CM

## 2011-04-16 MED ORDER — ROSUVASTATIN CALCIUM 40 MG PO TABS: 40.0000 mg | ORAL_TABLET | Freq: Every day | ORAL | Status: DC

## 2011-04-16 MED ORDER — LISINOPRIL 10 MG PO TABS: 10.0000 mg | ORAL_TABLET | Freq: Two times a day (BID) | ORAL | Status: DC

## 2011-04-16 NOTE — Assessment & Plan Note (Signed)
Labs have been stable.  I will check a BMET at the next appt.

## 2011-04-16 NOTE — Progress Notes (Signed)
HPI The patient returns for followup of his cardiomyopathy and coronary disease. At the last visit I increased the ACE inhibitor. I also gave him are started daily torsemide.  Consent time he has had less lower extremity edema.He has been doing little more walking. He did have a gout flare that improved but he is now having a flare again in his left hand. He has had no new PND or orthopnea. He denies chest pressure, neck or arm discomfort. He has had no palpitations, presyncope or syncope. He has had a couple of pounds of weight loss. He said fevers chills or cough. I did check a basic metabolic profile and his creatinine was stable.  No Known Allergies  Current Outpatient Prescriptions  Medication Sig Dispense Refill  . aspirin 325 MG tablet Take 325 mg by mouth daily.        . carvedilol (COREG) 12.5 MG tablet Take 12.5 mg by mouth 2 (two) times daily with a meal.        . Cinnamon 500 MG capsule Take 500 mg by mouth daily.        . colchicine 0.6 MG tablet Take 0.6 mg by mouth daily.        Marland Kitchen glipiZIDE (GLUCOTROL) 5 MG tablet Take 5 mg by mouth 2 (two) times daily before a meal.        . lisinopril (PRINIVIL,ZESTRIL) 10 MG tablet Take 1 tablet (10 mg total) by mouth daily.  30 tablet  11  . Misc Natural Products (TART CHERRY ADVANCED PO) Take by mouth.        . rosuvastatin (CRESTOR) 40 MG tablet Take 1 tablet (40 mg total) by mouth daily.  30 tablet  6  . torsemide (DEMADEX) 10 MG tablet Take 1 tablet (10 mg total) by mouth daily.  30 tablet  11  . DISCONTD: rosuvastatin (CRESTOR) 40 MG tablet Take 1 tablet (40 mg total) by mouth daily.  30 tablet  6    Past Medical History  Diagnosis Date  . Hypertension   . Diabetes mellitus   . Gout   . Chronic edema   . Obesity   . Coronary artery disease   . Ischemic cardiomyopathy     EF 25%  . CMI (chronic mesenteric ischemia)     Past Surgical History  Procedure Date  . Coronary artery bypass graft     Sequential left internal mammary  graft to the mid and distal LAD, sequential sapenous vein graft to the first, second, and third obutuse marginal branches  of the  left circumflex  coronary artery.  Saphenous vein graft to the distal right coronary artery.    ROS:  Erectile dysfunction.  Otherwise as stated in the HPI and negative for all other systems.  PHYSICAL EXAM BP 139/90  Pulse 91  Resp 16  Ht 6' (1.829 m)  Wt 246 lb (111.585 kg)  BMI 33.36 kg/m2 General: Chronically ill-appearing, well nourished, in no acute distress.  Head: normocephalic and atraumatic  Mouth: Teeth, gums and palate normal. Oral mucosa normal.  Neck: JVD at 45 degrees moderate  Chest Wall: Well-healed sternotomy scar  Lungs: Clear bilaterally to auscultation and percussion.  Abdomen: Positive bowel sounds no rebound, guarding, no organomegaly  Pulses: Unable to appreciate pedal pulses with moderate edema  Extremities: Moderate edema to the knees  Neurologic: Alert and oriented x 3.  Skin: Bandaged right leg ulcer  Cervical Nodes: no significant adenopathy  Psych: Normal affect.  Detailed Cardiovascular Exam  Heart Inspection: no deformities or lifts noted.  Palpation: normal PMI with no thrills palpable.  Auscultation: regular rate and rhythm, S1, S2 without murmurs, rubs, gallops, or clicks.  Pedal Pulses: Unable to appreciate pedal pulses with moderate edema  Radial Pulses: normal radial pulses bilaterally.   ASSESSMENT AND PLAN

## 2011-04-16 NOTE — Assessment & Plan Note (Signed)
He will continue with the current low dose of diuretic.

## 2011-04-16 NOTE — Patient Instructions (Signed)
Increase Lisinopril to 10 mg twice a day Continue all other medications as listed Follow up with Dr Antoine Poche in 1 month   Dr. Stacey Drain   [phone]

## 2011-04-16 NOTE — Assessment & Plan Note (Signed)
The patient has no new sypmtoms.  No further cardiovascular testing is indicated.  We will continue with aggressive risk reduction and meds as listed.  

## 2011-04-16 NOTE — Assessment & Plan Note (Signed)
He wants to avoid steroids.  I did speak with Dr. Kellie Simmering about him and he has kindly agreed to see [name] as a new patient.

## 2011-04-16 NOTE — Assessment & Plan Note (Signed)
I will increase the lisinopril to 10 mg bid.  I will continue to titrate meds.  In August I will plan another echo and he will likely be a candidate for an ICD.

## 2011-06-07 ENCOUNTER — Ambulatory Visit (INDEPENDENT_AMBULATORY_CARE_PROVIDER_SITE_OTHER): Payer: BC Managed Care – HMO | Admitting: Cardiology

## 2011-06-07 ENCOUNTER — Encounter: Payer: Self-pay | Admitting: Cardiology

## 2011-06-07 VITALS — BP 124/88 | HR 85 | Ht 71.0 in | Wt 246.0 lb

## 2011-06-07 DIAGNOSIS — I2589 Other forms of chronic ischemic heart disease: Secondary | ICD-10-CM

## 2011-06-07 DIAGNOSIS — I10 Essential (primary) hypertension: Secondary | ICD-10-CM

## 2011-06-07 DIAGNOSIS — R609 Edema, unspecified: Secondary | ICD-10-CM

## 2011-06-07 DIAGNOSIS — I5022 Chronic systolic (congestive) heart failure: Secondary | ICD-10-CM

## 2011-06-07 LAB — BASIC METABOLIC PANEL
BUN: 41 mg/dL — ABNORMAL HIGH (ref 6–23)
CO2: 28 mEq/L (ref 19–32)
Calcium: 9 mg/dL (ref 8.4–10.5)
Creatinine, Ser: 1.5 mg/dL (ref 0.4–1.5)
GFR: 65.19 mL/min (ref >60.00)
Glucose, Bld: 178 mg/dL — ABNORMAL HIGH (ref 70–99)
Sodium: 139 mEq/L (ref 135–145)

## 2011-06-07 MED ORDER — CARVEDILOL 6.25 MG PO TABS: 6.2500 mg | ORAL_TABLET | Freq: Two times a day (BID) | ORAL | Status: DC

## 2011-06-07 MED ORDER — NITROGLYCERIN 0.4 MG SL SUBL: 0.4000 mg | SUBLINGUAL_TABLET | SUBLINGUAL | Status: DC | PRN

## 2011-06-07 NOTE — Assessment & Plan Note (Signed)
Today I will increase the Coreg.  I will continue to titrate meds and in the weeks to come I will follow up an echo and consider him for an ICD.

## 2011-06-07 NOTE — Assessment & Plan Note (Signed)
He needs to keep his feet elevated more.

## 2011-06-07 NOTE — Patient Instructions (Addendum)
Please increase your carvedilol by 6.25 mg twice a day.  Continue the 12.5 mg tablet twice a day. Continue all other medications as listed.  Please have lab work today. (BMP)  Follow up with Dr Antoine Poche in approximately 2 week.

## 2011-06-07 NOTE — Progress Notes (Signed)
HPI The patient returns for followup of his cardiomyopathy and coronary disease. At the last visit I increased the ACE inhibitor.  I had him see Dr. Kellie Simmering for gout.  He is feeing much better from this standpoint.  He has much less SOB.  He is walking around the track most days and has increased his distance.  The patient denies any new symptoms such as chest discomfort, neck or arm discomfort. There has been no new shortness of breath, PND or orthopnea. There have been no reported palpitations, presyncope or syncope.  His lower extremity edema is baseline.]  No Known Allergies  Current Outpatient Prescriptions  Medication Sig Dispense Refill  . allopurinol (ZYLOPRIM) 100 MG tablet Take 100 mg by mouth daily.        Marland Kitchen aspirin 325 MG tablet Take 325 mg by mouth daily.        . carvedilol (COREG) 12.5 MG tablet Take 12.5 mg by mouth 2 (two) times daily with a meal.        . Cinnamon 500 MG capsule Take 500 mg by mouth daily.        . colchicine 0.6 MG tablet Take 0.6 mg by mouth daily.       Marland Kitchen glipiZIDE (GLUCOTROL) 5 MG tablet Take 5 mg by mouth 2 (two) times daily before a meal.        . lisinopril (PRINIVIL,ZESTRIL) 10 MG tablet Take 1 tablet (10 mg total) by mouth 2 (two) times daily.  60 tablet  11  . Misc Natural Products (TART CHERRY ADVANCED PO) Take by mouth.        . predniSONE (DELTASONE) 10 MG tablet Take 10 mg by mouth daily. Will taper up then back down       . rosuvastatin (CRESTOR) 40 MG tablet Take 1 tablet (40 mg total) by mouth daily.  30 tablet  6  . torsemide (DEMADEX) 10 MG tablet Take 1 tablet (10 mg total) by mouth daily.  30 tablet  11    Past Medical History  Diagnosis Date  . Hypertension   . Diabetes mellitus   . Gout   . Chronic edema   . Obesity   . Coronary artery disease   . Ischemic cardiomyopathy     EF 25%  . CMI (chronic mesenteric ischemia)     Past Surgical History  Procedure Date  . Coronary artery bypass graft     Sequential left internal  mammary graft to the mid and distal LAD, sequential sapenous vein graft to the first, second, and third obutuse marginal branches  of the  left circumflex  coronary artery.  Saphenous vein graft to the distal right coronary artery.    ROS:  Erectile dysfunction.  Otherwise as stated in the HPI and negative for all other systems.  PHYSICAL EXAM BP 124/88  Pulse 85  Ht 5\' 11"  (1.803 m)  Wt 246 lb (111.585 kg)  BMI 34.31 kg/m2 General: Chronically ill-appearing, well nourished, in no acute distress.  Head: normocephalic and atraumatic  Mouth: Teeth, gums and palate normal. Oral mucosa normal.  Neck: JVD at 45 degrees ,mild Chest Wall: Well-healed sternotomy scar  Lungs: Clear bilaterally to auscultation and percussion.  Abdomen: Positive bowel sounds no rebound, guarding, no organomegaly  Pulses: Unable to appreciate pedal pulses with moderate edema  Extremities: Moderate edema to the knees  Neurologic: Alert and oriented x 3.  Skin: Bandaged right leg ulcer  Cervical Nodes: no significant adenopathy  Psych: Normal affect.  Detailed Cardiovascular Exam  Heart Inspection: no deformities or lifts noted.  Palpation: normal PMI with no thrills palpable.  Auscultation: regular rate and rhythm, S1, S2 without murmurs, rubs, gallops, or clicks.  Pedal Pulses: Unable to appreciate pedal pulses with moderate edema  Radial Pulses: normal radial pulses bilaterally.   ASSESSMENT AND PLAN

## 2011-06-07 NOTE — Assessment & Plan Note (Signed)
This is being managed in the context of treating the CHF.

## 2011-06-07 NOTE — Assessment & Plan Note (Signed)
The patient has no new sypmtoms.  No further cardiovascular testing is indicated.  We will continue with aggressive risk reduction and meds as listed.  

## 2011-06-18 ENCOUNTER — Ambulatory Visit: Payer: BC Managed Care – HMO | Admitting: Cardiology

## 2011-06-25 ENCOUNTER — Ambulatory Visit (INDEPENDENT_AMBULATORY_CARE_PROVIDER_SITE_OTHER): Payer: BC Managed Care – HMO | Admitting: Cardiology

## 2011-06-25 ENCOUNTER — Encounter: Payer: Self-pay | Admitting: Cardiology

## 2011-06-25 DIAGNOSIS — E663 Overweight: Secondary | ICD-10-CM

## 2011-06-25 DIAGNOSIS — I2589 Other forms of chronic ischemic heart disease: Secondary | ICD-10-CM

## 2011-06-25 DIAGNOSIS — N529 Male erectile dysfunction, unspecified: Secondary | ICD-10-CM

## 2011-06-25 DIAGNOSIS — I10 Essential (primary) hypertension: Secondary | ICD-10-CM

## 2011-06-25 DIAGNOSIS — Z951 Presence of aortocoronary bypass graft: Secondary | ICD-10-CM

## 2011-06-25 DIAGNOSIS — N259 Disorder resulting from impaired renal tubular function, unspecified: Secondary | ICD-10-CM

## 2011-06-25 MED ORDER — VARDENAFIL HCL 20 MG PO TABS: 20.0000 mg | ORAL_TABLET | Freq: Every day | ORAL | Status: DC | PRN

## 2011-06-25 MED ORDER — CARVEDILOL 25 MG PO TABS: 25.0000 mg | ORAL_TABLET | Freq: Two times a day (BID) | ORAL | Status: DC

## 2011-06-25 NOTE — Assessment & Plan Note (Signed)
We reviewed his weights today. He does know that I want continued diet and exercise for weight loss.

## 2011-06-25 NOTE — Assessment & Plan Note (Signed)
Will titrate the carvedilol to 25 mg b.i.d. Next I will titrate his ACE inhibitor and I will followup with an echocardiogram.

## 2011-06-25 NOTE — Assessment & Plan Note (Signed)
His creatinine was 1.5 last month.  No followup is necessary at this point.

## 2011-06-25 NOTE — Assessment & Plan Note (Signed)
I am treating this in the context of managing his cardiomyopathy.

## 2011-06-25 NOTE — Assessment & Plan Note (Signed)
I will take the liberty of giving him Levitra for ED.

## 2011-06-25 NOTE — Progress Notes (Signed)
HPI The patient returns for followup of his cardiomyopathy and coronary disease. At the last visit I increased his beta blocker.  Since that time he has done well.  He is trying to walk more.  He has continued dyspnea but this is slowly improved.  The patient denies any new symptoms such as chest discomfort, neck or arm discomfort. There has been no new shortness of breath, PND or orthopnea. There have been no reported palpitations, presyncope or syncope.  He still complains of ED.  No Known Allergies  Current Outpatient Prescriptions  Medication Sig Dispense Refill  . allopurinol (ZYLOPRIM) 100 MG tablet Take 100 mg by mouth daily.        Marland Kitchen aspirin 325 MG tablet Take 325 mg by mouth daily.        . carvedilol (COREG) 12.5 MG tablet Take 12.5 mg by mouth 2 (two) times daily with a meal.        . carvedilol (COREG) 6.25 MG tablet Take 1 tablet (6.25 mg total) by mouth 2 (two) times daily.  60 tablet  11  . Cinnamon 500 MG capsule Take 500 mg by mouth daily.        . colchicine 0.6 MG tablet Take 0.6 mg by mouth daily.       Marland Kitchen glipiZIDE (GLUCOTROL) 5 MG tablet Take 5 mg by mouth 2 (two) times daily before a meal.        . lisinopril (PRINIVIL,ZESTRIL) 10 MG tablet Take 1 tablet (10 mg total) by mouth 2 (two) times daily.  60 tablet  11  . Misc Natural Products (TART CHERRY ADVANCED PO) Take by mouth.        . nitroGLYCERIN (NITROSTAT) 0.4 MG SL tablet Place 1 tablet (0.4 mg total) under the tongue every 5 (five) minutes as needed for chest pain.  25 tablet  12  . predniSONE (DELTASONE) 10 MG tablet Take 10 mg by mouth daily. Will taper up then back down       . rosuvastatin (CRESTOR) 40 MG tablet Take 1 tablet (40 mg total) by mouth daily.  30 tablet  6  . torsemide (DEMADEX) 10 MG tablet Take 1 tablet (10 mg total) by mouth daily.  30 tablet  11    Past Medical History  Diagnosis Date  . Hypertension   . Diabetes mellitus   . Gout   . Chronic edema   . Obesity   . Coronary artery disease    . Ischemic cardiomyopathy     EF 25%  . CMI (chronic mesenteric ischemia)     Past Surgical History  Procedure Date  . Coronary artery bypass graft     Sequential left internal mammary graft to the mid and distal LAD, sequential sapenous vein graft to the first, second, and third obutuse marginal branches  of the  left circumflex  coronary artery.  Saphenous vein graft to the distal right coronary artery.  ROS:  As stated in the HPI and negative for all other systems.  GENERAL:  Well appearing VS:  125/85, 78, 16   WT.  250 HEENT:  Pupils equal round and reactive, fundi not visualized, oral mucosa unremarkable NECK:  Jugular venous distention 8cm at 45 degrees, waveform within normal limits, carotid upstroke brisk and symmetric, no bruits, no thyromegaly LYMPHATICS:  No cervical, inguinal adenopathy LUNGS:  Clear to auscultation bilaterally BACK:  No CVA tenderness CHEST:  Unremarkable HEART:  PMI not displaced or sustained,S1 and S2 within normal limits, no S3,  no S4, no clicks, no rubs, no murmurs ABD:  Flat, positive bowel sounds normal in frequency in pitch, no bruits, no rebound, no guarding, no midline pulsatile mass, no hepatomegaly, no splenomegaly, obese EXT:  2 plus pulses throughout, mild edema, no cyanosis no clubbing SKIN:  No rashes no nodules NEURO:  Cranial nerves II through XII grossly intact, motor grossly intact throughout PSYCH:  Cognitively intact, oriented to person place and time  ASSESSMENT AND PLAN

## 2011-06-25 NOTE — Assessment & Plan Note (Signed)
The patient has no new sypmtoms.  No further cardiovascular testing is indicated.  We will continue with aggressive risk reduction and meds as listed.  I do suspect that he will need an ICD in the future.

## 2011-06-25 NOTE — Patient Instructions (Signed)
Please increase your Carvedilol to 25 mg twice a day.  Continue all other medications as listed.  You are being referred to Cardiac Rehab at [facility].  Follow up with Dr Antoine Poche in 2 weeks (Monday September 17th 3:30 in the [place] office).

## 2011-07-08 ENCOUNTER — Encounter: Payer: Self-pay | Admitting: Cardiology

## 2011-07-08 ENCOUNTER — Ambulatory Visit (INDEPENDENT_AMBULATORY_CARE_PROVIDER_SITE_OTHER): Payer: BC Managed Care – HMO | Admitting: Cardiology

## 2011-07-08 DIAGNOSIS — I2589 Other forms of chronic ischemic heart disease: Secondary | ICD-10-CM

## 2011-07-08 DIAGNOSIS — E785 Hyperlipidemia, unspecified: Secondary | ICD-10-CM

## 2011-07-08 DIAGNOSIS — I10 Essential (primary) hypertension: Secondary | ICD-10-CM

## 2011-07-08 MED ORDER — LISINOPRIL 10 MG PO TABS: 15.0000 mg | ORAL_TABLET | Freq: Two times a day (BID) | ORAL | Status: DC

## 2011-07-08 MED ORDER — ATORVASTATIN CALCIUM 80 MG PO TABS: 80.0000 mg | ORAL_TABLET | Freq: Every day | ORAL | Status: DC

## 2011-07-08 NOTE — Assessment & Plan Note (Signed)
Today I will increase his ACE inhibitor to lisinopril 15 mg bid.  After reaching target meds I will check an echo and consider ICD for primary prevention.

## 2011-07-08 NOTE — Assessment & Plan Note (Signed)
He will need a BMET when I see him in two weeks.

## 2011-07-08 NOTE — Assessment & Plan Note (Signed)
This is being managed in the context of treating his CHF  

## 2011-07-08 NOTE — Progress Notes (Signed)
HPI The patient returns for followup of his cardiomyopathy and coronary disease. At the last visit I increased his beta blocker.  Since that time he has done well.  He continues to feel much better than he did after his CABG.  The patient denies any new symptoms such as chest discomfort, neck or arm discomfort. There has been no new shortness of breath, PND or orthopnea. There have been no reported palpitations, presyncope or syncope.  He might get some SOB with significant activity (Class II).    No Known Allergies  Current Outpatient Prescriptions  Medication Sig Dispense Refill  . allopurinol (ZYLOPRIM) 100 MG tablet Take 100 mg by mouth daily as needed.       Marland Kitchen aspirin 325 MG tablet Take 325 mg by mouth daily.        . carvedilol (COREG) 25 MG tablet Take 1 tablet (25 mg total) by mouth 2 (two) times daily with a meal.  60 tablet  6  . Cinnamon 500 MG capsule Take 500 mg by mouth daily as needed.       . colchicine 0.6 MG tablet Take 0.6 mg by mouth daily as needed.       Marland Kitchen glipiZIDE (GLUCOTROL) 5 MG tablet Take 5 mg by mouth 2 (two) times daily before a meal.        . lisinopril (PRINIVIL,ZESTRIL) 10 MG tablet Take 1 tablet (10 mg total) by mouth 2 (two) times daily.  60 tablet  11  . Misc Natural Products (TART CHERRY ADVANCED PO) Take 1 tablet by mouth daily as needed.       . nitroGLYCERIN (NITROSTAT) 0.4 MG SL tablet Place 1 tablet (0.4 mg total) under the tongue every 5 (five) minutes as needed for chest pain.  25 tablet  12  . predniSONE (DELTASONE) 10 MG tablet Take 10 mg by mouth daily. Will taper up then back down       . rosuvastatin (CRESTOR) 40 MG tablet Take 1 tablet (40 mg total) by mouth daily.  30 tablet  6  . torsemide (DEMADEX) 10 MG tablet Take 1 tablet (10 mg total) by mouth daily.  30 tablet  11    Past Medical History  Diagnosis Date  . Hypertension   . Diabetes mellitus   . Gout   . Chronic edema   . Obesity   . Coronary artery disease   . Ischemic  cardiomyopathy     EF 25%  . CMI (chronic mesenteric ischemia)     Past Surgical History  Procedure Date  . Coronary artery bypass graft     Sequential left internal mammary graft to the mid and distal LAD, sequential sapenous vein graft to the first, second, and third obutuse marginal branches  of the  left circumflex  coronary artery.  Saphenous vein graft to the distal right coronary artery.  ROS:  As stated in the HPI and negative for all other systems.  GENERAL:  Well appearing VS:  125/85, 78, 16   WT.  250 HEENT:  Pupils equal round and reactive, fundi not visualized, oral mucosa unremarkable NECK:  Jugular venous distention 8cm at 45 degrees, waveform within normal limits, carotid upstroke brisk and symmetric, no bruits, no thyromegaly LYMPHATICS:  No cervical, inguinal adenopathy LUNGS:  Clear to auscultation bilaterally BACK:  No CVA tenderness CHEST:  Unremarkable HEART:  PMI not displaced or sustained,S1 and S2 within normal limits, no S3, no S4, no clicks, no rubs, no murmurs ABD:  Flat,  positive bowel sounds normal in frequency in pitch, no bruits, no rebound, no guarding, no midline pulsatile mass, no hepatomegaly, no splenomegaly, obese EXT:  2 plus pulses throughout, mild edema, no cyanosis no clubbing SKIN:  No rashes no nodules NEURO:  Cranial nerves II through XII grossly intact, motor grossly intact throughout PSYCH:  Cognitively intact, oriented to person place and time  ASSESSMENT AND PLAN

## 2011-07-08 NOTE — Patient Instructions (Signed)
Follow up as scheduled with Dr. Antoine Poche in [place]. Stop Crestor. Start Lipitor (atorvastatin) 80 mg every night. Increase Lisinopril to 15 mg (1 1/2 of your 10 mg tablets) two times a day.. Cardiac Rehab.

## 2011-07-08 NOTE — Assessment & Plan Note (Signed)
He is having trouble paying for the Crestor.  I will switch to Lipitor 80 mg daily.

## 2011-07-08 NOTE — Assessment & Plan Note (Signed)
The patient has no new sypmtoms.  No further cardiovascular testing is indicated.  We will continue with aggressive risk reduction and meds as listed.  

## 2011-07-08 NOTE — Assessment & Plan Note (Signed)
He could not afford Levitra.

## 2011-07-09 ENCOUNTER — Ambulatory Visit: Payer: BC Managed Care – HMO | Admitting: Cardiology

## 2011-07-23 ENCOUNTER — Ambulatory Visit (INDEPENDENT_AMBULATORY_CARE_PROVIDER_SITE_OTHER): Payer: BC Managed Care – PPO | Admitting: Cardiology

## 2011-07-23 ENCOUNTER — Encounter: Payer: Self-pay | Admitting: Cardiology

## 2011-07-23 DIAGNOSIS — Z951 Presence of aortocoronary bypass graft: Secondary | ICD-10-CM

## 2011-07-23 DIAGNOSIS — N259 Disorder resulting from impaired renal tubular function, unspecified: Secondary | ICD-10-CM

## 2011-07-23 DIAGNOSIS — I10 Essential (primary) hypertension: Secondary | ICD-10-CM

## 2011-07-23 DIAGNOSIS — I2589 Other forms of chronic ischemic heart disease: Secondary | ICD-10-CM

## 2011-07-23 MED ORDER — LISINOPRIL 20 MG PO TABS: 20.0000 mg | ORAL_TABLET | Freq: Two times a day (BID) | ORAL | Status: DC

## 2011-07-23 NOTE — Progress Notes (Signed)
HPI The patient returns for followup of his cardiomyopathy and coronary disease. At the last visit I increased his ACE inhibitor.  I also changed him to Lipitor for cost Since that time he has done well.  The patient denies any new symptoms such as chest discomfort, neck or arm discomfort. There has been no new shortness of breath, PND or orthopnea. There have been no reported palpitations, presyncope or syncope.  He might get some SOB with significant activity (Class II).  He has no weight gain or edema.  No Known Allergies  Current Outpatient Prescriptions  Medication Sig Dispense Refill  . aspirin 325 MG tablet Take 325 mg by mouth daily.        Marland Kitchen atorvastatin (LIPITOR) 80 MG tablet Take 80 mg by mouth daily.        . carvedilol (COREG) 25 MG tablet Take 1 tablet (25 mg total) by mouth 2 (two) times daily with a meal.  60 tablet  6  . Cinnamon 500 MG capsule Take 500 mg by mouth daily as needed.       . colchicine 0.6 MG tablet Take 0.6 mg by mouth daily as needed.       Marland Kitchen glipiZIDE (GLUCOTROL) 5 MG tablet Take 5 mg by mouth 2 (two) times daily before a meal.        . lisinopril (PRINIVIL,ZESTRIL) 10 MG tablet Take 1.5 tablets (15 mg total) by mouth 2 (two) times daily.  90 tablet  11  . Misc Natural Products (TART CHERRY ADVANCED PO) Take 1 tablet by mouth daily as needed.       . nitroGLYCERIN (NITROSTAT) 0.4 MG SL tablet Place 1 tablet (0.4 mg total) under the tongue every 5 (five) minutes as needed for chest pain.  25 tablet  12  . predniSONE (DELTASONE) 10 MG tablet Take 10 mg by mouth daily. Will taper up then back down       . torsemide (DEMADEX) 10 MG tablet Take 1 tablet (10 mg total) by mouth daily.  30 tablet  11  . allopurinol (ZYLOPRIM) 100 MG tablet Take 100 mg by mouth daily as needed.         Past Medical History  Diagnosis Date  . Hypertension   . Diabetes mellitus   . Gout   . Chronic edema   . Obesity   . Coronary artery disease   . Ischemic cardiomyopathy     EF  25%  . CMI (chronic mesenteric ischemia)     Past Surgical History  Procedure Date  . Coronary artery bypass graft     Sequential left internal mammary graft to the mid and distal LAD, sequential sapenous vein graft to the first, second, and third obutuse marginal branches  of the  left circumflex  coronary artery.  Saphenous vein graft to the distal right coronary artery.   ROS:  As stated in the HPI and negative for all other systems.  GENERAL:  Well appearing VS:  125/85, 78, 16   WT.  250 HEENT:  Pupils equal round and reactive, fundi not visualized, oral mucosa unremarkable NECK:  Jugular venous distention 8cm at 45 degrees, waveform within normal limits, carotid upstroke brisk and symmetric, no bruits, no thyromegaly LYMPHATICS:  No cervical, inguinal adenopathy LUNGS:  Clear to auscultation bilaterally BACK:  No CVA tenderness CHEST:  Well healed sternotomy scar. HEART:  PMI not displaced or sustained,S1 and S2 within normal limits, no S3, no S4, no clicks, no rubs, no murmurs  ABD:  Flat, positive bowel sounds normal in frequency in pitch, no bruits, no rebound, no guarding, no midline pulsatile mass, no hepatomegaly, no splenomegaly, obese EXT:  2 plus pulses throughout, trace edema, no cyanosis no clubbing SKIN:  No rashes no nodules NEURO:  Cranial nerves II through XII grossly intact, motor grossly intact throughout PSYCH:  Cognitively intact, oriented to person place and time  EKG:  Normal sinus rhythm, rate 69, right bundle branch block with right ventricular hypertrophy, anterior T-wave inversions, no change ASSESSMENT AND PLAN

## 2011-07-23 NOTE — Assessment & Plan Note (Signed)
I reviewed the labs I did recently and his creat is now down to 1.5!.  I will repeat a BMET in one month.

## 2011-07-23 NOTE — Assessment & Plan Note (Signed)
This is being treated in the context of treating the CHF.

## 2011-07-23 NOTE — Assessment & Plan Note (Signed)
At this point I will increase his lisinopril to 20 mg bid.  I will check an echo in 1 month.  He might need an ICD.

## 2011-07-23 NOTE — Patient Instructions (Signed)
Please increase Lisinopril to 20 mg twice a day Continue all other medications as listed  Please have a basic metabolic panel in [place] in 1 month.  Please see Dr Antoine Poche in 6 weeks.

## 2011-07-23 NOTE — Assessment & Plan Note (Signed)
The patient has no new sypmtoms.  No further cardiovascular testing is indicated.  We will continue with aggressive risk reduction and meds as listed.  

## 2011-08-19 ENCOUNTER — Telehealth: Payer: Self-pay | Admitting: Cardiology

## 2011-08-19 NOTE — Telephone Encounter (Signed)
New message;  Pt has a head cold and wants to know what he can take OTC.  Been taking Mucinex.  No fever.

## 2011-08-19 NOTE — Telephone Encounter (Signed)
Pt aware that it is OK to take plain Robitussin for cough, Benadryl OTC and saline nasal spray.

## 2011-08-20 ENCOUNTER — Telehealth: Payer: Self-pay | Admitting: Cardiology

## 2011-08-20 NOTE — Telephone Encounter (Signed)
New message:  Pt called and stated he has a nasty head cold.  Talked to you yesterday.  Today it seems that it is getting better, but wants to double check regarding same.  Should he be doing anything different

## 2011-08-20 NOTE — Telephone Encounter (Signed)
Spoke with pt who states he feels like his cold is getting some better.  His wife wanted him to call to double check if he needed to be seen.  Pt denied any edema in abdomin, feet and ankles.  Denies any wt gain. States all s/s started with a sore throat.  Instructed pt that s/s do sound more like a cough but the only way to know for sure is for him to come in for an evaluation.  appt given for Thursday at 10:15 with Dr Antoine Poche.  He will cancel if he is better.

## 2011-08-22 ENCOUNTER — Other Ambulatory Visit (INDEPENDENT_AMBULATORY_CARE_PROVIDER_SITE_OTHER): Payer: Self-pay | Admitting: *Deleted

## 2011-08-22 ENCOUNTER — Ambulatory Visit (INDEPENDENT_AMBULATORY_CARE_PROVIDER_SITE_OTHER): Payer: Self-pay | Admitting: Cardiology

## 2011-08-22 ENCOUNTER — Encounter: Payer: Self-pay | Admitting: Cardiology

## 2011-08-22 ENCOUNTER — Telehealth: Payer: Self-pay | Admitting: Cardiology

## 2011-08-22 VITALS — BP 132/89 | HR 90 | Resp 18 | Ht 72.0 in | Wt 257.0 lb

## 2011-08-22 DIAGNOSIS — I509 Heart failure, unspecified: Secondary | ICD-10-CM

## 2011-08-22 DIAGNOSIS — I10 Essential (primary) hypertension: Secondary | ICD-10-CM

## 2011-08-22 DIAGNOSIS — I2589 Other forms of chronic ischemic heart disease: Secondary | ICD-10-CM

## 2011-08-22 DIAGNOSIS — J3489 Other specified disorders of nose and nasal sinuses: Secondary | ICD-10-CM

## 2011-08-22 DIAGNOSIS — I2510 Atherosclerotic heart disease of native coronary artery without angina pectoris: Secondary | ICD-10-CM

## 2011-08-22 DIAGNOSIS — E663 Overweight: Secondary | ICD-10-CM

## 2011-08-22 DIAGNOSIS — N259 Disorder resulting from impaired renal tubular function, unspecified: Secondary | ICD-10-CM

## 2011-08-22 LAB — BRAIN NATRIURETIC PEPTIDE: Pro B Natriuretic peptide (BNP): 801 pg/mL — ABNORMAL HIGH (ref 0.0–100.0)

## 2011-08-22 MED ORDER — CARVEDILOL 6.25 MG PO TABS: 6.2500 mg | ORAL_TABLET | Freq: Two times a day (BID) | ORAL | Status: DC

## 2011-08-22 NOTE — Assessment & Plan Note (Signed)
He is very disappointed that he hasn't lost more weight. I applauded his weight loss to date and told him we should concentrate on physical fitness rather than a certain way.

## 2011-08-22 NOTE — Assessment & Plan Note (Signed)
His blood pressure is being managed in the context of treating his cardiomyopathy.

## 2011-08-22 NOTE — Assessment & Plan Note (Signed)
This does not appear to be heart failure. At this point I agree with his conservative over-the-counter therapy. No prescription medications are indicated.

## 2011-08-22 NOTE — Telephone Encounter (Signed)
Per pt he has [name] [place].  It is a three month rolling policy.  He pays for it every 3 months.  The current card that is scanned, policy#[id number] termed 07-05-11.  Per pt it was supposed to have been renewed the next day 07-06-11 and he had not received the new policy card yet.  He doesn't remember the exact date he mailed his premium but he was on the road to your office when I called him and didn't have all the info with him.  He lives in [place].   When I called [employer] [place] and tried to locate his new policy# either by his name or SSAN, they couldn't locate the new policy#.  They just had him as termed.  Pt will be self pay for today's echo as his deductible is $1,000 and  restarts every three months so it won't really make a difference.  The pt understands this and agrees to be self pay for today.  He also has a financial aid pkg from our office that he will look into applying for.

## 2011-08-22 NOTE — Assessment & Plan Note (Signed)
I will be checking an echocardiogram. I will increase his carvedilol by adding 6.25 twice a day to his existing regimen. If his EF remains low he will be referred for defibrillator.

## 2011-08-22 NOTE — Progress Notes (Signed)
HPI The patient returns for followup of his cardiomyopathy and coronary disease.  He called to be added to the schedule because he was having cough with mucus production. He said some sinus drainage which actually has been helped by Mucinex. He's not been having any new PND or orthopnea. He denies any chest pressure, neck or arm discomfort. He's not been having any palpitations, presyncope or syncope. His weights have been stable and he has less edema he thinks that he had in the past. He's had no fevers or chills. He has a mild cough.  No Known Allergies  Current Outpatient Prescriptions  Medication Sig Dispense Refill  . allopurinol (ZYLOPRIM) 100 MG tablet Take 100 mg by mouth daily as needed.       Marland Kitchen aspirin 325 MG tablet Take 325 mg by mouth daily.        Marland Kitchen atorvastatin (LIPITOR) 80 MG tablet Take 80 mg by mouth daily.        . carvedilol (COREG) 25 MG tablet Take 1 tablet (25 mg total) by mouth 2 (two) times daily with a meal.  60 tablet  6  . Cinnamon 500 MG capsule Take 500 mg by mouth daily as needed.       . colchicine 0.6 MG tablet Take 0.6 mg by mouth daily as needed.       Marland Kitchen glipiZIDE (GLUCOTROL) 5 MG tablet Take 5 mg by mouth 2 (two) times daily before a meal.        . lisinopril (PRINIVIL,ZESTRIL) 20 MG tablet Take 1 tablet (20 mg total) by mouth 2 (two) times daily.  60 tablet  11  . Misc Natural Products (TART CHERRY ADVANCED PO) Take 1 tablet by mouth daily as needed.       . nitroGLYCERIN (NITROSTAT) 0.4 MG SL tablet Place 1 tablet (0.4 mg total) under the tongue every 5 (five) minutes as needed for chest pain.  25 tablet  12  . predniSONE (DELTASONE) 10 MG tablet Take 10 mg by mouth daily.       Marland Kitchen torsemide (DEMADEX) 10 MG tablet Take 1 tablet (10 mg total) by mouth daily.  30 tablet  11    Past Medical History  Diagnosis Date  . Hypertension   . Diabetes mellitus   . Gout   . Chronic edema   . Obesity   . Coronary artery disease   . Ischemic cardiomyopathy     EF  25%  . CMI (chronic mesenteric ischemia)     Past Surgical History  Procedure Date  . Coronary artery bypass graft     Sequential left internal mammary graft to the mid and distal LAD, sequential sapenous vein graft to the first, second, and third obutuse marginal branches  of the  left circumflex  coronary artery.  Saphenous vein graft to the distal right coronary artery.   ROS:  As stated in the HPI and negative for all other systems.  GENERAL:  Well appearing VS:  125/85, 78, 16   WT.  250 HEENT:  Pupils equal round and reactive, fundi not visualized, oral mucosa unremarkable NECK:  Jugular venous distention 8cm at 45 degrees, waveform within normal limits, carotid upstroke brisk and symmetric, no bruits, no thyromegaly LYMPHATICS:  No cervical, inguinal adenopathy LUNGS:  Clear to auscultation bilaterally BACK:  No CVA tenderness CHEST:  Well healed sternotomy scar. HEART:  PMI not displaced or sustained,S1 and S2 within normal limits, no S3, no S4, no clicks, no rubs, no murmurs  ABD:  Flat, positive bowel sounds normal in frequency in pitch, no bruits, no rebound, no guarding, no midline pulsatile mass, no hepatomegaly, no splenomegaly, obese EXT:  2 plus pulses throughout, no edema, no cyanosis no clubbing SKIN:  No rashes no nodules NEURO:  Cranial nerves II through XII grossly intact, motor grossly intact throughout PSYCH:  Cognitively intact, oriented to person place and time  ASSESSMENT AND PLAN

## 2011-08-22 NOTE — Assessment & Plan Note (Signed)
I will check a basic metabolic profile today. Otherwise the plan is as outlined below.

## 2011-08-22 NOTE — Patient Instructions (Signed)
Please increase your carvedilol by 6.25 mg twice a day.  Continue your 25 mg tablets twice day as well. Continue all other medications as listed.  Please have blood work today.  Your physician has requested that you have an echocardiogram ASAP in the [facility]. Echocardiography is a painless test that uses sound waves to create images of your heart. It provides your doctor with information about the size and shape of your heart and how well your heart's chambers and valves are working. This procedure takes approximately one hour. There are no restrictions for this procedure.  Follow up with Dr Antoine Poche in 6 weeks

## 2011-08-29 ENCOUNTER — Institutional Professional Consult (permissible substitution): Payer: Self-pay | Admitting: Internal Medicine

## 2011-09-05 ENCOUNTER — Ambulatory Visit: Payer: BC Managed Care – PPO | Admitting: Cardiology

## 2011-09-18 ENCOUNTER — Encounter: Payer: Self-pay | Admitting: Internal Medicine

## 2011-09-18 ENCOUNTER — Ambulatory Visit (INDEPENDENT_AMBULATORY_CARE_PROVIDER_SITE_OTHER): Payer: Self-pay | Admitting: Internal Medicine

## 2011-09-18 DIAGNOSIS — I5022 Chronic systolic (congestive) heart failure: Secondary | ICD-10-CM | POA: Insufficient documentation

## 2011-09-18 DIAGNOSIS — I2589 Other forms of chronic ischemic heart disease: Secondary | ICD-10-CM

## 2011-09-18 DIAGNOSIS — I509 Heart failure, unspecified: Secondary | ICD-10-CM

## 2011-09-18 DIAGNOSIS — E663 Overweight: Secondary | ICD-10-CM

## 2011-09-18 NOTE — Patient Instructions (Signed)
Your physician has recommended that you have a defibrillator inserted. An implantable cardioverter defibrillator (ICD) is a small device that is placed in your chest or, in rare cases, your abdomen. This device uses electrical pulses or shocks to help control life-threatening, irregular heartbeats that could lead the heart to suddenly stop beating (sudden cardiac arrest). Leads are attached to the ICD that goes into your heart. This is done in the hospital and usually requires an overnight stay. Please see the instruction sheet given to you today for more information.  Patient will call us if he wishes to proceed  Patient given ICD educational  material and Dr Jenel Lucks card

## 2011-09-18 NOTE — Progress Notes (Signed)
Primary Care Physician: Joaquin Courts, DO Referring Physician: Dr Fanny Dance is a 50 y.o. male with a h/o CAD s/p CABG 2/12 and chronic systolic dysfunction with biventricular failure who presents today for EP consultation regarding risk stratification for sudden death.  He initially presented 1/12 with progressive edema and shortness of breath.  His symptoms worsened and he presented to [facility] 2/12 with acute decompensated CHF.  He underwent cath which revealed severe ischemic cardiomyopathy with total occlusion of the left anterior descending, total occlusion of the right coronary artery, and severe disease involving the remaining circumflex vessel.  He therefore underwent CABG (6v) by Dr Laneta Simmers 12/05/10.  He has done reasonably well since that time.  He has been followed by Dr Antoine Poche and placed on an optimal medical regimen.  Despite medical therapy, recent Echo reveals biventricular failure with EF 20-25%. Presently, he "feels good".  He is walking daily 1.5 miles at a slow pace.  He continues to have moderate BLE edema.  He denies symptoms of palpitations, chest pain,orthopnea, PND,  dizziness, presyncope, syncope, or neurologic sequela. The patient is tolerating medications without difficulties and is otherwise without complaint today.   Past Medical History  Diagnosis Date  . Hypertension   . Diabetes mellitus   . Gout   . Chronic edema   . Obesity   . Coronary artery disease   . Ischemic cardiomyopathy     EF 25%  . CMI (chronic mesenteric ischemia)    Past Surgical History  Procedure Date  . Coronary artery bypass graft 2/12    Sequential left internal mammary graft to the mid and distal LAD, sequential sapenous vein graft to the first, second, and third obutuse marginal branches  of the  left circumflex  coronary artery.  Saphenous vein graft to the distal right coronary artery.    Current Outpatient Prescriptions  Medication Sig Dispense Refill  .  allopurinol (ZYLOPRIM) 100 MG tablet Take 100 mg by mouth daily as needed.       Marland Kitchen aspirin 325 MG tablet Take 325 mg by mouth daily.        Marland Kitchen atorvastatin (LIPITOR) 80 MG tablet Take 80 mg by mouth daily.        . carvedilol (COREG) 25 MG tablet Take 1 tablet (25 mg total) by mouth 2 (two) times daily with a meal.  60 tablet  6  . carvedilol (COREG) 6.25 MG tablet Take 1 tablet (6.25 mg total) by mouth 2 (two) times daily.  60 tablet  11  . Cinnamon 500 MG capsule Take 500 mg by mouth daily as needed.       . colchicine 0.6 MG tablet Take 0.6 mg by mouth daily as needed.       Marland Kitchen glipiZIDE (GLUCOTROL) 5 MG tablet Take 5 mg by mouth 2 (two) times daily before a meal.        . lisinopril (PRINIVIL,ZESTRIL) 20 MG tablet Take 1 tablet (20 mg total) by mouth 2 (two) times daily.  60 tablet  11  . Misc Natural Products (TART CHERRY ADVANCED PO) Take 1 tablet by mouth daily as needed.       . nitroGLYCERIN (NITROSTAT) 0.4 MG SL tablet Place 1 tablet (0.4 mg total) under the tongue every 5 (five) minutes as needed for chest pain.  25 tablet  12  . predniSONE (DELTASONE) 10 MG tablet Take 10 mg by mouth daily.       Marland Kitchen torsemide (DEMADEX) 10  MG tablet Take 1 tablet (10 mg total) by mouth daily.  30 tablet  11    No Known Allergies  History   Social History  . Marital Status: Married    Spouse Name: N/A    Number of Children: N/A  . Years of Education: N/A   Occupational History  . Truck Advertising copywriter   Social History Main Topics  . Smoking status: Never Smoker   . Smokeless tobacco: Never Used  . Alcohol Use: Yes     occasional  . Drug Use: No  . Sexually Active: Not on file   Other Topics Concern  . Not on file   Social History Narrative   Pt lives in [place] with spouse.Worked as a Solicitor, though now on disability    Family History  Problem Relation Age of Onset  . Diabetes Other   . Hypertension Other     ROS- All systems are  reviewed and negative except as per the HPI above  Physical Exam: Filed Vitals:   09/18/11 1230  BP: 129/92  Pulse: 73  Resp: 18  Height: 6' (1.829 m)  Weight: 265 lb 1.9 oz (120.258 kg)    GEN- The patient is overweight appearing, alert and oriented x 3 today.   Head- normocephalic, atraumatic Eyes-  Sclera clear, conjunctiva pink Ears- hearing intact Oropharynx- clear Neck- supple, no JVP Lymph- no cervical lymphadenopathy Lungs- Clear to ausculation bilaterally, normal work of breathing Heart- Regular rate and rhythm, wide S2 split GI- soft, NT, ND, + BS Extremities- no clubbing, cyanosis, 3+ edema MS- no significant deformity or atrophy Skin- no rash or lesion Psych- euthymic mood, full affect Neuro- strength and sensation are intact  EKG today reveals sinus rhythm 71 BPM, PR 170, RBBB (QRS ), anterolateral infarction  Assessment and Plan:

## 2011-09-18 NOTE — Assessment & Plan Note (Signed)
No ischemic symptoms No changes today 

## 2011-09-18 NOTE — Assessment & Plan Note (Signed)
The patient has an ischemic CM (EF 20-25%), NYHA Class II/III CHF, and CAD. At this time, he meets MADIT II/ SCD-HeFT criteria for ICD implantation for primary prevention of sudden death.  Risks, benefits, alternatives to ICD implantation were discussed in detail with the patient today. 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  He also has concerns about his insurance coverage.   I have given him a brochure on ICDs today.  He will further contemplate this option and will contact me if he wishes to proceed.  He will continue his current medications and follow-up with Dr Antoine Poche in the interim.

## 2011-09-18 NOTE — Assessment & Plan Note (Signed)
Weight loss encouraged 

## 2011-10-07 ENCOUNTER — Ambulatory Visit (INDEPENDENT_AMBULATORY_CARE_PROVIDER_SITE_OTHER): Payer: Self-pay | Admitting: Cardiology

## 2011-10-07 ENCOUNTER — Encounter: Payer: Self-pay | Admitting: Cardiology

## 2011-10-07 DIAGNOSIS — I2589 Other forms of chronic ischemic heart disease: Secondary | ICD-10-CM

## 2011-10-07 DIAGNOSIS — I10 Essential (primary) hypertension: Secondary | ICD-10-CM

## 2011-10-07 DIAGNOSIS — I509 Heart failure, unspecified: Secondary | ICD-10-CM

## 2011-10-07 DIAGNOSIS — Z951 Presence of aortocoronary bypass graft: Secondary | ICD-10-CM

## 2011-10-07 DIAGNOSIS — I5022 Chronic systolic (congestive) heart failure: Secondary | ICD-10-CM

## 2011-10-07 MED ORDER — CARVEDILOL 25 MG PO TABS: ORAL_TABLET | ORAL | Status: DC

## 2011-10-07 NOTE — Assessment & Plan Note (Signed)
Today I will increase the carvedilol to 37.5 mg twice a day. He will continue the other medicines as listed.

## 2011-10-07 NOTE — Patient Instructions (Signed)
Please increase your Carvedilol to 37.5 mg twice a day Continue all other medications as listed  Follow up in 4 months with Dr Antoine Poche.  You will receive a letter in the mail 2 months before you are due.  Please call us when you receive this letter to schedule your follow up appointment.

## 2011-10-07 NOTE — Assessment & Plan Note (Signed)
The patient has no new sypmtoms.  No further cardiovascular testing is indicated.  We will continue with aggressive risk reduction and meds as listed.  

## 2011-10-07 NOTE — Assessment & Plan Note (Signed)
This is being managed in the context of treating his cardiomyopathy.

## 2011-10-07 NOTE — Progress Notes (Signed)
HPI The patient returns for followup of his cardiomyopathy and coronary disease.  Since  I last saw him he says that he has done well from a cardiovascular standpoint. He's walking. He does have a little left greater than right leg swelling. However, his weights have been stable. He denies any PND or orthopnea. He's had no chest pressure, neck or arm discomfort. He's had no palpitations, presyncope or syncope. Of note he did see Dr. Johney Frame and is being considered for an ICD.  However, he is having trouble with his insurance.  No Known Allergies  Current Outpatient Prescriptions  Medication Sig Dispense Refill  . allopurinol (ZYLOPRIM) 100 MG tablet Take 100 mg by mouth daily as needed.       Marland Kitchen aspirin 325 MG tablet Take 325 mg by mouth daily.        Marland Kitchen atorvastatin (LIPITOR) 80 MG tablet Take 80 mg by mouth daily.        . carvedilol (COREG) 25 MG tablet Take 1 tablet (25 mg total) by mouth 2 (two) times daily with a meal.  60 tablet  6  . carvedilol (COREG) 6.25 MG tablet Take 1 tablet (6.25 mg total) by mouth 2 (two) times daily.  60 tablet  11  . Cinnamon 500 MG capsule Take 500 mg by mouth daily as needed.       . colchicine 0.6 MG tablet Take 0.6 mg by mouth daily as needed.       Marland Kitchen glipiZIDE (GLUCOTROL) 5 MG tablet Take 5 mg by mouth 2 (two) times daily before a meal.        . lisinopril (PRINIVIL,ZESTRIL) 20 MG tablet Take 1 tablet (20 mg total) by mouth 2 (two) times daily.  60 tablet  11  . Misc Natural Products (TART CHERRY ADVANCED PO) Take 1 tablet by mouth daily as needed.       . nitroGLYCERIN (NITROSTAT) 0.4 MG SL tablet Place 1 tablet (0.4 mg total) under the tongue every 5 (five) minutes as needed for chest pain.  25 tablet  12  . predniSONE (DELTASONE) 10 MG tablet Take 10 mg by mouth daily.       Marland Kitchen torsemide (DEMADEX) 10 MG tablet Take 1 tablet (10 mg total) by mouth daily.  30 tablet  11    Past Medical History  Diagnosis Date  . Hypertension   . Diabetes mellitus   .  Gout   . Chronic edema   . Obesity   . Coronary artery disease   . Ischemic cardiomyopathy     EF 25%  . CMI (chronic mesenteric ischemia)     Past Surgical History  Procedure Date  . Coronary artery bypass graft 2/12    Sequential left internal mammary graft to the mid and distal LAD, sequential sapenous vein graft to the first, second, and third obutuse marginal branches  of the  left circumflex  coronary artery.  Saphenous vein graft to the distal right coronary artery.   ROS:  As stated in the HPI and negative for all other systems.  GENERAL:  Well appearing VS:  125/85, 78, 16   WT.  250 HEENT:  Pupils equal round and reactive, fundi not visualized, oral mucosa unremarkable NECK:  Jugular venous distention 8cm at 45 degrees, waveform within normal limits, carotid upstroke brisk and symmetric, no bruits, no thyromegaly LYMPHATICS:  No cervical, inguinal adenopathy LUNGS:  Clear to auscultation bilaterally BACK:  No CVA tenderness CHEST:  Well healed sternotomy scar. HEART:  PMI not displaced or sustained,S1 and S2 within normal limits, no S3, no S4, no clicks, no rubs, no murmurs ABD:  Flat, positive bowel sounds normal in frequency in pitch, no bruits, no rebound, no guarding, no midline pulsatile mass, no hepatomegaly, no splenomegaly, obese EXT:  2 plus pulses throughout, no edema, no cyanosis no clubbing SKIN:  No rashes no nodules NEURO:  Cranial nerves II through XII grossly intact, motor grossly intact throughout PSYCH:  Cognitively intact, oriented to person place and time  ASSESSMENT AND PLAN

## 2011-11-28 ENCOUNTER — Telehealth: Payer: Self-pay | Admitting: Cardiology

## 2011-11-28 NOTE — Telephone Encounter (Signed)
Pt calling today because he is concerned about his weight.  He states that he is up about 10 lbs since Christmas.  His wt today is 265, here 12/17 his wt was 263.  Pt denies any edema SOB cp but does state that he feels as though his abdomin in larger.  Pt will increase his Demadex as instructed at prior office visit by Dr Antoine Poche.  Pt is also frustrated with his inability to loose wt.  He says he is only eating vegetables and baked lean meats like chicken and not snacking between meals.  He is also walking.  Encouraged pt to continue with his efforts.  Also recommended the [employer] or for him to count calories.  He states understanding and will call back next week if there are changes.

## 2011-11-28 NOTE — Telephone Encounter (Signed)
New Msg: pt calling wanting to speak with Pam regarding questions pt has about one of pt medications. Pt also would like discuss pt picking a little weight. Please return pt call to discuss further.

## 2011-12-09 ENCOUNTER — Telehealth: Payer: Self-pay | Admitting: Cardiology

## 2011-12-09 DIAGNOSIS — I2589 Other forms of chronic ischemic heart disease: Secondary | ICD-10-CM

## 2011-12-09 DIAGNOSIS — I10 Essential (primary) hypertension: Secondary | ICD-10-CM

## 2011-12-09 MED ORDER — CARVEDILOL 25 MG PO TABS: ORAL_TABLET | ORAL | Status: DC

## 2011-12-09 MED ORDER — TORSEMIDE 10 MG PO TABS: ORAL_TABLET | ORAL | Status: DC

## 2011-12-09 NOTE — Telephone Encounter (Signed)
Refill sent into pharmacy for #60 Torsemide to take daily or as directed.  Carvedilol 25 mg tablets 1 and 1/2 tablets daily #90 with refill sent also

## 2011-12-09 NOTE — Telephone Encounter (Signed)
Pt has been put on a fluid pill per Dr. Antoine Poche and since he was told by Dr. Antoine Poche he can take 2 pills if needed he needs the Rx to say that because he ran out early and Pharm will not refill because they are saying it's too early. Please call into [facility]. Pt is having the same issue with his carvedilol 6.25mg  and he is completely out of the carvedilol.

## 2011-12-10 ENCOUNTER — Other Ambulatory Visit: Payer: Self-pay | Admitting: *Deleted

## 2011-12-13 ENCOUNTER — Telehealth: Payer: Self-pay | Admitting: Cardiology

## 2011-12-13 NOTE — Telephone Encounter (Signed)
12/13/11--called [facility] and spoke with pharmacist --reviewed torsamide order with pharmacist and got that straightened out and also got straightened out coreg prescription --nt

## 2011-12-13 NOTE — Telephone Encounter (Signed)
New msg [facility] wants clarification on torosemide rx please call

## 2012-01-29 ENCOUNTER — Telehealth: Payer: Self-pay | Admitting: Cardiology

## 2012-01-29 NOTE — Telephone Encounter (Signed)
Patient request return call at [phone] cell #   Patient was told to call Dr. Antoine Poche if he noticed a little weight gain in the stomach area.  Patient weight has been changed over the last 2-3 weeks.  Patient is scheduled to see [facility] 4/19.

## 2012-01-29 NOTE — Telephone Encounter (Signed)
Pt calling concerned that he is not taking enough Torsemide because his abdomin feels larger.  He is very frustrated that he is eating mostly vegetables and all baked meats but is not losing wt.  He is not SOB and is not having any cp.  His wts are stable.  Explained to pt that due to the fact that he is not having any other s/s he should continue medications as RXed.  Instructed pt to discuss with Dr Antoine Poche at his appointment next week.  Pt is in agreement.

## 2012-02-07 ENCOUNTER — Encounter: Payer: Self-pay | Admitting: Cardiology

## 2012-02-07 ENCOUNTER — Telehealth: Payer: Self-pay

## 2012-02-07 ENCOUNTER — Ambulatory Visit (INDEPENDENT_AMBULATORY_CARE_PROVIDER_SITE_OTHER): Payer: BC Managed Care – HMO | Admitting: Cardiology

## 2012-02-07 VITALS — BP 130/85 | HR 71 | Ht 72.0 in | Wt 273.0 lb

## 2012-02-07 DIAGNOSIS — N259 Disorder resulting from impaired renal tubular function, unspecified: Secondary | ICD-10-CM

## 2012-02-07 DIAGNOSIS — Z951 Presence of aortocoronary bypass graft: Secondary | ICD-10-CM

## 2012-02-07 DIAGNOSIS — I5022 Chronic systolic (congestive) heart failure: Secondary | ICD-10-CM

## 2012-02-07 DIAGNOSIS — I509 Heart failure, unspecified: Secondary | ICD-10-CM

## 2012-02-07 DIAGNOSIS — I10 Essential (primary) hypertension: Secondary | ICD-10-CM

## 2012-02-07 DIAGNOSIS — F329 Major depressive disorder, single episode, unspecified: Secondary | ICD-10-CM

## 2012-02-07 DIAGNOSIS — I2589 Other forms of chronic ischemic heart disease: Secondary | ICD-10-CM

## 2012-02-07 LAB — BASIC METABOLIC PANEL
GFR: 66.56 mL/min (ref >60.00)
Glucose, Bld: 156 mg/dL — ABNORMAL HIGH (ref 70–99)
Potassium: 4.3 mEq/L (ref 3.5–5.1)
Sodium: 139 mEq/L (ref 135–145)

## 2012-02-07 MED ORDER — TORSEMIDE 20 MG PO TABS: 20.0000 mg | ORAL_TABLET | Freq: Every day | ORAL | Status: DC

## 2012-02-07 MED ORDER — POTASSIUM CHLORIDE ER 10 MEQ PO TBCR: 10.0000 meq | EXTENDED_RELEASE_TABLET | Freq: Every day | ORAL | Status: DC

## 2012-02-07 NOTE — Assessment & Plan Note (Signed)
The patient has no new sypmtoms.  No further cardiovascular testing is indicated.  We will continue with aggressive risk reduction and meds as listed.  

## 2012-02-07 NOTE — Progress Notes (Signed)
HPI The patient returns for followup of his cardiomyopathy and coronary disease.  Since  I last saw him he says that he has had increasing weights. He is up several pounds over the last couple of months. This is despite compliance with his medications salt and fluid restriction. He has increased abdominal edema. He's not describing new PND or orthopnea. He's not describing palpitations, presyncope or syncope. He denies any chest pressure, neck or arm discomfort. He actually thinks he feels pretty well. His wife reports that he is depressed.  No Known Allergies  Current Outpatient Prescriptions  Medication Sig Dispense Refill  . allopurinol (ZYLOPRIM) 100 MG tablet Take 100 mg by mouth daily as needed.       Marland Kitchen aspirin 325 MG tablet Take 325 mg by mouth daily.        Marland Kitchen atorvastatin (LIPITOR) 80 MG tablet Take 80 mg by mouth daily.        . carvedilol (COREG) 25 MG tablet 1 and 1/2 tablets twice a day for a total of 37.5 mg twice a day  90 tablet  6  . colchicine 0.6 MG tablet Take 0.6 mg by mouth daily as needed.       Marland Kitchen glipiZIDE (GLUCOTROL) 5 MG tablet Take 5 mg by mouth 2 (two) times daily before a meal.        . lisinopril (PRINIVIL,ZESTRIL) 20 MG tablet Take 1 tablet (20 mg total) by mouth 2 (two) times daily.  60 tablet  11  . Misc Natural Products (TART CHERRY ADVANCED PO) Take 1 tablet by mouth daily as needed.       . nitroGLYCERIN (NITROSTAT) 0.4 MG SL tablet Place 1 tablet (0.4 mg total) under the tongue every 5 (five) minutes as needed for chest pain.  25 tablet  12  . predniSONE (DELTASONE) 10 MG tablet Take 10 mg by mouth daily. As needed      . torsemide (DEMADEX) 10 MG tablet Or as directed  60 tablet  11    Past Medical History  Diagnosis Date  . Hypertension   . Diabetes mellitus   . Gout   . Chronic edema   . Obesity   . Coronary artery disease   . Ischemic cardiomyopathy     EF 25%  . CMI (chronic mesenteric ischemia)     Past Surgical History  Procedure Date    . Coronary artery bypass graft 2/12    Sequential left internal mammary graft to the mid and distal LAD, sequential sapenous vein graft to the first, second, and third obutuse marginal branches  of the  left circumflex  coronary artery.  Saphenous vein graft to the distal right coronary artery.   ROS:  As stated in the HPI and negative for all other systems.  GENERAL:  Well appearing VS:  125/85, 78, 16   WT.  250 HEENT:  Pupils equal round and reactive, fundi not visualized, oral mucosa unremarkable NECK:  Jugular venous distention 14cm at 45 degrees, waveform within normal limits, carotid upstroke brisk and symmetric, no bruits, no thyromegaly LYMPHATICS:  No cervical, inguinal adenopathy LUNGS:  Clear to auscultation bilaterally BACK:  No CVA tenderness CHEST:  Well healed sternotomy scar. HEART:  PMI not displaced or sustained,S1 and S2 within normal limits, no S3, no S4, no clicks, no rubs, no murmurs ABD:  Flat, positive bowel sounds normal in frequency in pitch, no bruits, no rebound, no guarding, no midline pulsatile mass, no hepatomegaly, no splenomegaly, obese EXT:  2 plus pulses throughout, marked edema, no cyanosis no clubbing SKIN:  No rashes no nodules NEURO:  Cranial nerves II through XII grossly intact, motor grossly intact throughout PSYCH:  Cognitively intact, oriented to person place and time  EKG:  Sinus rhythm, rate 71, axis within normal limits, intervals within normal limits, frequent premature ventricular contractions. 02/07/2012   ASSESSMENT AND PLAN

## 2012-02-07 NOTE — Assessment & Plan Note (Signed)
I have discussed with him the relationship between depression and heart failure and I have suggested followup with his primary provider.

## 2012-02-07 NOTE — Patient Instructions (Addendum)
Please increase your Torsemide to 20 mg a day and start potassium cho10 MEQ a day. Continue all other medications as listed  Please have blood work today and again in 2 weeks. (BMP)  Follow up with Dr Antoine Poche in 1 month.

## 2012-02-07 NOTE — Telephone Encounter (Signed)
Call patient about lab

## 2012-02-07 NOTE — Assessment & Plan Note (Signed)
He is having worsening heart failure. He is on good medical regimen. He's following salt and fluid restriction. I will start with a basic metabolic profile today. I will increase his torsemide to 40 mg daily. I will likely need to uptitrate this. He had his last echo in the fall.

## 2012-02-07 NOTE — Assessment & Plan Note (Signed)
I will check a basic metabolic profile today and again in 2 weeks.

## 2012-02-10 ENCOUNTER — Telehealth: Payer: Self-pay | Admitting: Internal Medicine

## 2012-02-10 NOTE — Telephone Encounter (Signed)
Mr James [name] reports that his insurance will not cover a defib. because it is pre-existing and he had a 1-2 week lapse in coverage.  He is on disability but has a two year waiting period.  I recommended that he contact his insurance company to see if there is any way they can make an exception due to it being medically necessary.  I am also forwarding this message to Dr Jenel Lucks nurse to see if she knows of another option.

## 2012-02-10 NOTE — Telephone Encounter (Signed)
Pt is trying to get a defib and his insurance told him they would not cover getting a defib placed because it is considered a pre existing condition but he is on disability and he wants to know if we have any suggestions

## 2012-02-11 ENCOUNTER — Telehealth: Payer: Self-pay | Admitting: *Deleted

## 2012-02-11 NOTE — Telephone Encounter (Signed)
Received phone call from Claudette Stapler re: what to do about pt. Needing defib but has pre-existing condition on [employer] and also has disability, but disability has a 2 year waiting period,however disability had told pt if they needed defib before the 2 yr waiting period that they could submit a letter of medical necessity to waive that 2 year requirement.   Do you think Dr. Johney Frame could write this letter for him?

## 2012-02-11 NOTE — Telephone Encounter (Signed)
Will have Dr Antoine Poche write the initial letter and Dr Johney Frame can put an addendum to his letter to submit

## 2012-02-20 NOTE — Telephone Encounter (Signed)
Pt calling again inquiring if Letter of Medical Necessity for insurance company for defibrillator has been done by either Dr. Johney Frame or Hochrein.

## 2012-02-21 ENCOUNTER — Encounter: Payer: Self-pay | Admitting: Cardiology

## 2012-02-21 NOTE — Telephone Encounter (Signed)
Will request from Dr Antoine Poche ASAP

## 2012-02-26 ENCOUNTER — Encounter: Payer: Self-pay | Admitting: Cardiology

## 2012-02-26 NOTE — Telephone Encounter (Signed)
Letter completed.

## 2012-02-27 ENCOUNTER — Telehealth: Payer: Self-pay | Admitting: Cardiology

## 2012-02-27 NOTE — Telephone Encounter (Signed)
Pt has a question and would not tell me he only wants to talk to you

## 2012-02-27 NOTE — Telephone Encounter (Signed)
Reviewed appt time and lab results with pt who states understanding

## 2012-02-28 ENCOUNTER — Telehealth: Payer: Self-pay | Admitting: Cardiology

## 2012-02-28 NOTE — Telephone Encounter (Signed)
PT AWARE BUN VALUE  SAME  AS  WHEN  BMET WAS DONE IN April  2013  PT AWARE DR [facility] HAS NOT REVIEWED AT THIS TIME WILL FORWARD FOR REVIEW AND IF ANY CHANGES TO BE   MADE WILL NOTIFY  PT BY  PHONE the patient AGREES./CY

## 2012-02-28 NOTE — Telephone Encounter (Signed)
New msg Pt wants to know about test results

## 2012-03-04 NOTE — Telephone Encounter (Signed)
His labs are OK.  Plan as previously outlined.

## 2012-03-05 NOTE — Telephone Encounter (Signed)
Pt aware of results of lab results.  Weight is now down 8 lbs since changing medications.

## 2012-03-17 ENCOUNTER — Telehealth: Payer: Self-pay | Admitting: Cardiology

## 2012-03-17 NOTE — Telephone Encounter (Signed)
Pt wanted to be seen before 12 pm if possible this day.  He is aware that Dr Antoine Poche is not in the office until 2.  He will leave the appt as it stands

## 2012-03-17 NOTE — Telephone Encounter (Signed)
New msg Pt wants to talk you about his appt on Thursday. He wants to know if he can move to earlier in the day.

## 2012-03-19 ENCOUNTER — Ambulatory Visit (INDEPENDENT_AMBULATORY_CARE_PROVIDER_SITE_OTHER): Payer: BC Managed Care – PPO | Admitting: Cardiology

## 2012-03-19 ENCOUNTER — Encounter: Payer: Self-pay | Admitting: Cardiology

## 2012-03-19 VITALS — BP 130/100 | HR 73 | Ht 72.0 in | Wt 251.0 lb

## 2012-03-19 DIAGNOSIS — E663 Overweight: Secondary | ICD-10-CM

## 2012-03-19 DIAGNOSIS — I5022 Chronic systolic (congestive) heart failure: Secondary | ICD-10-CM

## 2012-03-19 DIAGNOSIS — I509 Heart failure, unspecified: Secondary | ICD-10-CM

## 2012-03-19 DIAGNOSIS — I10 Essential (primary) hypertension: Secondary | ICD-10-CM

## 2012-03-19 DIAGNOSIS — I2589 Other forms of chronic ischemic heart disease: Secondary | ICD-10-CM

## 2012-03-19 DIAGNOSIS — Z951 Presence of aortocoronary bypass graft: Secondary | ICD-10-CM

## 2012-03-19 DIAGNOSIS — N259 Disorder resulting from impaired renal tubular function, unspecified: Secondary | ICD-10-CM

## 2012-03-19 MED ORDER — CARVEDILOL 25 MG PO TABS: ORAL_TABLET | ORAL | Status: DC

## 2012-03-19 MED ORDER — CARVEDILOL 6.25 MG PO TABS: 6.2500 mg | ORAL_TABLET | Freq: Two times a day (BID) | ORAL | Status: DC

## 2012-03-19 NOTE — Assessment & Plan Note (Signed)
This is being managed in the context of treating his CHF  

## 2012-03-19 NOTE — Patient Instructions (Signed)
Please increase your carvedilol by another 6.25 mg tablet twice a day. Continue all other medications as listed  Follow up with Dr Antoine Poche in 2 months

## 2012-03-19 NOTE — Assessment & Plan Note (Signed)
Weight loss encouraged 

## 2012-03-19 NOTE — Progress Notes (Signed)
HPI The patient returns for followup of his cardiomyopathy and coronary disease.  At the last visit he was having increasing weights and dyspnea. I doubled his torsemide. He has since lost 22 pounds. We did increase his potassium. A followup basic metabolic profile demonstrated stable renal function and potassium. He feels much better. He is able to be more active. His breathing is improved. He denies any new PND or orthopnea. He has no new palpitations, presyncope or syncope. He is not describing chest pain. His lower extremity swelling is improved.  No Known Allergies  Current Outpatient Prescriptions  Medication Sig Dispense Refill  . allopurinol (ZYLOPRIM) 100 MG tablet Take 100 mg by mouth daily as needed.       Marland Kitchen aspirin 325 MG tablet Take 325 mg by mouth daily.        Marland Kitchen atorvastatin (LIPITOR) 80 MG tablet Take 80 mg by mouth daily.        . carvedilol (COREG) 25 MG tablet 1 and 1/2 tablets twice a day for a total of 37.5 mg twice a day  90 tablet  6  . colchicine 0.6 MG tablet Take 0.6 mg by mouth daily as needed.       Marland Kitchen glipiZIDE (GLUCOTROL) 5 MG tablet Take 5 mg by mouth 2 (two) times daily before a meal.        . lisinopril (PRINIVIL,ZESTRIL) 20 MG tablet Take 1 tablet (20 mg total) by mouth 2 (two) times daily.  60 tablet  11  . Misc Natural Products (TART CHERRY ADVANCED PO) Take 1 tablet by mouth daily as needed.       . nitroGLYCERIN (NITROSTAT) 0.4 MG SL tablet Place 1 tablet (0.4 mg total) under the tongue every 5 (five) minutes as needed for chest pain.  25 tablet  12  . potassium chloride (K-DUR) 10 MEQ tablet Take 1 tablet (10 mEq total) by mouth daily.  30 tablet  11  . predniSONE (DELTASONE) 10 MG tablet Take 10 mg by mouth daily. As needed      . torsemide (DEMADEX) 20 MG tablet Take 1 tablet (20 mg total) by mouth daily. Or as directed  60 tablet  11    Past Medical History  Diagnosis Date  . Hypertension   . Diabetes mellitus   . Gout   . Chronic edema   .  Obesity   . Coronary artery disease   . Ischemic cardiomyopathy     EF 25%  . CMI (chronic mesenteric ischemia)     Past Surgical History  Procedure Date  . Coronary artery bypass graft 2/12    Sequential left internal mammary graft to the mid and distal LAD, sequential sapenous vein graft to the first, second, and third obutuse marginal branches  of the  left circumflex  coronary artery.  Saphenous vein graft to the distal right coronary artery.   ROS:  As stated in the HPI and negative for all other systems.  GENERAL:  Well appearing VS:  130/100 , 73, 251 HEENT:  Pupils equal round and reactive, fundi not visualized, oral mucosa unremarkable NECK:  Jugular venous distention 10cm at 45 degrees, waveform within normal limits, carotid upstroke brisk and symmetric, no bruits, no thyromegaly LYMPHATICS:  No cervical, inguinal adenopathy LUNGS:  Clear to auscultation bilaterally BACK:  No CVA tenderness CHEST:  Well healed sternotomy scar. HEART:  PMI not displaced or sustained,S1 and S2 within normal limits, no S3, no S4, no clicks, no rubs, no murmurs  ABD:  Flat, positive bowel sounds normal in frequency in pitch, no bruits, no rebound, no guarding, no midline pulsatile mass, no hepatomegaly, no splenomegaly, obese EXT:  2 plus pulses throughout, moderate edema, no cyanosis no clubbing SKIN:  No rashes no nodules NEURO:  Cranial nerves II through XII grossly intact, motor grossly intact throughout PSYCH:  Cognitively intact, oriented to person place and time   ASSESSMENT AND PLAN

## 2012-03-19 NOTE — Assessment & Plan Note (Signed)
This is stable as reported.  No change in therapy is indicated.

## 2012-03-19 NOTE — Assessment & Plan Note (Signed)
The patient has no new sypmtoms.  No further cardiovascular testing is indicated.  We will continue with aggressive risk reduction and meds as listed.  

## 2012-03-19 NOTE — Assessment & Plan Note (Addendum)
His volume seems to be much improved. I would like to try to titrate his block or more by adding another 6.25 mg of Coreg twice a day to his existing regimen.  I have written a letter of medical necessity to his insurance company to try to get his ICD. Unfortunately they have yet to approve this.

## 2012-04-17 ENCOUNTER — Telehealth: Payer: Self-pay | Admitting: Cardiology

## 2012-04-17 NOTE — Telephone Encounter (Signed)
New Problem:    Patient called in with insurance information regarding his receiving a Defibrillator. Please call back.

## 2012-05-28 ENCOUNTER — Ambulatory Visit (INDEPENDENT_AMBULATORY_CARE_PROVIDER_SITE_OTHER): Payer: BC Managed Care – PPO | Admitting: Cardiology

## 2012-05-28 ENCOUNTER — Encounter: Payer: Self-pay | Admitting: Cardiology

## 2012-05-28 VITALS — BP 120/90 | HR 79 | Ht 72.0 in | Wt 242.8 lb

## 2012-05-28 DIAGNOSIS — I10 Essential (primary) hypertension: Secondary | ICD-10-CM

## 2012-05-28 DIAGNOSIS — Z951 Presence of aortocoronary bypass graft: Secondary | ICD-10-CM

## 2012-05-28 DIAGNOSIS — I2589 Other forms of chronic ischemic heart disease: Secondary | ICD-10-CM

## 2012-05-28 LAB — BASIC METABOLIC PANEL
CO2: 24 mEq/L (ref 19–32)
Calcium: 8.9 mg/dL (ref 8.4–10.5)
Chloride: 103 mEq/L (ref 96–112)
Glucose, Bld: 165 mg/dL — ABNORMAL HIGH (ref 70–99)
Potassium: 4.8 mEq/L (ref 3.5–5.1)
Sodium: 135 mEq/L (ref 135–145)

## 2012-05-28 MED ORDER — VARDENAFIL HCL 20 MG PO TABS: 20.0000 mg | ORAL_TABLET | Freq: Every day | ORAL | Status: DC | PRN

## 2012-05-28 MED ORDER — CARVEDILOL 25 MG PO TABS: 50.0000 mg | ORAL_TABLET | Freq: Two times a day (BID) | ORAL | Status: DC

## 2012-05-28 NOTE — Patient Instructions (Addendum)
Please increase you Carvedilol to 25 mg two (2) tablets twice a day Continue all other medications as listed.  Please have blood work today (BMP)  Follow to in 3 months with Dr Antoine Poche

## 2012-05-28 NOTE — Progress Notes (Signed)
HPI The patient returns for followup of his cardiomyopathy and coronary disease.  At the last visit he was having increasing weights and dyspnea. I doubled his torsemide. He has since lost 22 pounds. We did increase his potassium. A followup basic metabolic profile demonstrated stable renal function and potassium. He feels much better. He is able to be more active. His breathing is improved. He denies any new PND or orthopnea. He has no new palpitations, presyncope or syncope. He is not describing chest pain. His lower extremity swelling is improved.  No Known Allergies  Current Outpatient Prescriptions  Medication Sig Dispense Refill  . allopurinol (ZYLOPRIM) 100 MG tablet Take 100 mg by mouth daily as needed.       Marland Kitchen aspirin 325 MG tablet Take 325 mg by mouth daily.        Marland Kitchen atorvastatin (LIPITOR) 80 MG tablet Take 80 mg by mouth daily.        . carvedilol (COREG) 25 MG tablet 1 and 1/2 tablets twice a day for a total of 37.5 mg twice a day  270 tablet  3  . carvedilol (COREG) 6.25 MG tablet Take 1 tablet (6.25 mg total) by mouth 2 (two) times daily.  180 tablet  3  . colchicine 0.6 MG tablet Take 0.6 mg by mouth daily as needed.       Marland Kitchen glipiZIDE (GLUCOTROL) 5 MG tablet Take 5 mg by mouth 2 (two) times daily before a meal.        . lisinopril (PRINIVIL,ZESTRIL) 20 MG tablet Take 1 tablet (20 mg total) by mouth 2 (two) times daily.  60 tablet  11  . Misc Natural Products (TART CHERRY ADVANCED PO) Take 1 tablet by mouth daily as needed.       . nitroGLYCERIN (NITROSTAT) 0.4 MG SL tablet Place 1 tablet (0.4 mg total) under the tongue every 5 (five) minutes as needed for chest pain.  25 tablet  12  . potassium chloride (K-DUR) 10 MEQ tablet Take 1 tablet (10 mEq total) by mouth daily.  30 tablet  11  . torsemide (DEMADEX) 20 MG tablet Take 1 tablet (20 mg total) by mouth daily. Or as directed  60 tablet  11    Past Medical History  Diagnosis Date  . Hypertension   . Diabetes mellitus     . Gout   . Chronic edema   . Obesity   . Coronary artery disease   . Ischemic cardiomyopathy     EF 25%  . CMI (chronic mesenteric ischemia)     Past Surgical History  Procedure Date  . Coronary artery bypass graft 2/12    Sequential left internal mammary graft to the mid and distal LAD, sequential sapenous vein graft to the first, second, and third obutuse marginal branches  of the  left circumflex  coronary artery.  Saphenous vein graft to the distal right coronary artery.   ROS:  As stated in the HPI and negative for all other systems.  GENERAL:  Well appearing VS:  120/90 , 79, 242 HEENT:  Pupils equal round and reactive, fundi not visualized, oral mucosa unremarkable NECK:  Jugular venous distention 8cm at 45 degrees, waveform within normal limits, carotid upstroke brisk and symmetric, no bruits, no thyromegaly LYMPHATICS:  No cervical, inguinal adenopathy LUNGS:  Clear to auscultation bilaterally BACK:  No CVA tenderness CHEST:  Well healed sternotomy scar. HEART:  PMI not displaced or sustained,S1 and S2 within normal limits, no S3, no  S4, no clicks, no rubs, no murmurs ABD:  Flat, positive bowel sounds normal in frequency in pitch, no bruits, no rebound, no guarding, no midline pulsatile mass, no hepatomegaly, no splenomegaly, obese EXT:  2 plus pulses throughout, trace edema, no cyanosis no clubbing SKIN:  No rashes no nodules NEURO:  Cranial nerves II through XII grossly intact, motor grossly intact throughout PSYCH:  Cognitively intact, oriented to person place and time   ASSESSMENT AND PLAN   Chronic systolic congestive heart failure -  The patient seems to be doing quite well compared to earlier this year. I will increase his carvedilol to 50 mg twice a day. He has been turned down by his insurance company for defibrillator but he continues to battle this as this is indicated. I have written a letter to no avail.   CORONARY ARTERY BYPASS GRAFT, HX OF -  The  patient has no new sypmtoms. No further cardiovascular testing is indicated. We will continue with aggressive risk reduction and meds as listed.   RENAL INSUFFICIENCY -  I would check a basic metabolic profile today.   HYPERTENSION, BENIGN -  This is being managed in the context of treating his CHF

## 2012-07-21 ENCOUNTER — Other Ambulatory Visit: Payer: Self-pay | Admitting: Cardiology

## 2012-09-02 ENCOUNTER — Ambulatory Visit (INDEPENDENT_AMBULATORY_CARE_PROVIDER_SITE_OTHER): Payer: BC Managed Care – PPO | Admitting: Cardiology

## 2012-09-02 ENCOUNTER — Encounter: Payer: Self-pay | Admitting: Cardiology

## 2012-09-02 VITALS — BP 120/82 | HR 78 | Ht 72.0 in | Wt 215.6 lb

## 2012-09-02 DIAGNOSIS — N259 Disorder resulting from impaired renal tubular function, unspecified: Secondary | ICD-10-CM

## 2012-09-02 DIAGNOSIS — I5022 Chronic systolic (congestive) heart failure: Secondary | ICD-10-CM

## 2012-09-02 DIAGNOSIS — I509 Heart failure, unspecified: Secondary | ICD-10-CM

## 2012-09-02 DIAGNOSIS — Z951 Presence of aortocoronary bypass graft: Secondary | ICD-10-CM

## 2012-09-02 DIAGNOSIS — I10 Essential (primary) hypertension: Secondary | ICD-10-CM

## 2012-09-02 LAB — BASIC METABOLIC PANEL
BUN: 35 mg/dL — ABNORMAL HIGH (ref 6–23)
CO2: 24 mEq/L (ref 19–32)
Chloride: 103 mEq/L (ref 96–112)
Creatinine, Ser: 1.5 mg/dL (ref 0.4–1.5)
Potassium: 4.2 mEq/L (ref 3.5–5.1)

## 2012-09-02 NOTE — Patient Instructions (Addendum)
The current medical regimen is effective;  continue present plan and medications.  Please have blood work today  (BMP)  Your physician has requested that you have an echocardiogram. Echocardiography is a painless test that uses sound waves to create images of your heart. It provides your doctor with information about the size and shape of your heart and how well your heart's chambers and valves are working. This procedure takes approximately one hour. There are no restrictions for this procedure.  Follow up in 4 months with Dr Antoine Poche.

## 2012-09-02 NOTE — Progress Notes (Signed)
HPI The patient returns for followup of his cardiomyopathy and coronary disease.  Since I last saw him he has done exceptionally well he's lost another 27 pounds by our weights. He is eating right and walking daily. With this level of activity he denies any symptoms currently. The patient denies any new symptoms such as chest discomfort, neck or arm discomfort. There has been no new shortness of breath, PND or orthopnea. There have been no reported palpitations, presyncope or syncope.  No Known Allergies  Current Outpatient Prescriptions  Medication Sig Dispense Refill  . aspirin 325 MG tablet Take 325 mg by mouth daily.        Marland Kitchen atorvastatin (LIPITOR) 80 MG tablet TAKE ONE TABLET BY MOUTH DAILY  30 tablet  9  . carvedilol (COREG) 25 MG tablet Take 2 tablets (50 mg total) by mouth 2 (two) times daily with a meal.  120 tablet  6  . colchicine 0.6 MG tablet Take 0.6 mg by mouth daily as needed.       Marland Kitchen glipiZIDE (GLUCOTROL) 5 MG tablet Take 5 mg by mouth 2 (two) times daily before a meal.        . lisinopril (PRINIVIL,ZESTRIL) 20 MG tablet TAKE 1 TABLET BY MOUTH TWICE DAILY  60 tablet  9  . Misc Natural Products (TART CHERRY ADVANCED PO) Take 1 tablet by mouth daily as needed.       . nitroGLYCERIN (NITROSTAT) 0.4 MG SL tablet Place 1 tablet (0.4 mg total) under the tongue every 5 (five) minutes as needed for chest pain.  25 tablet  12  . potassium chloride (K-DUR) 10 MEQ tablet Take 1 tablet (10 mEq total) by mouth daily.  30 tablet  11  . torsemide (DEMADEX) 20 MG tablet Take 1 tablet (20 mg total) by mouth daily. Or as directed  60 tablet  11  . vardenafil (LEVITRA) 20 MG tablet Take 1 tablet (20 mg total) by mouth daily as needed for erectile dysfunction.  10 tablet  1    Past Medical History  Diagnosis Date  . Hypertension   . Diabetes mellitus   . Gout   . Chronic edema   . Obesity   . Coronary artery disease   . Ischemic cardiomyopathy     EF 25%  . CMI (chronic mesenteric  ischemia)     Past Surgical History  Procedure Date  . Coronary artery bypass graft 2/12    Sequential left internal mammary graft to the mid and distal LAD, sequential sapenous vein graft to the first, second, and third obutuse marginal branches  of the  left circumflex  coronary artery.  Saphenous vein graft to the distal right coronary artery.   ROS:  As stated in the HPI and negative for all other systems.  GENERAL:  Well appearing VS:  120/90 , 79, 242 HEENT:  Pupils equal round and reactive, fundi not visualized, oral mucosa unremarkable NECK:  Jugular venous distention 8cm at 45 degrees, waveform within normal limits, carotid upstroke brisk and symmetric, no bruits, no thyromegaly LYMPHATICS:  No cervical, inguinal adenopathy LUNGS:  Clear to auscultation bilaterally BACK:  No CVA tenderness CHEST:  Well healed sternotomy scar. HEART:  PMI not displaced or sustained,S1 and S2 within normal limits, no S3, no S4, no clicks, no rubs, no murmurs ABD:  Flat, positive bowel sounds normal in frequency in pitch, no bruits, no rebound, no guarding, no midline pulsatile mass, no hepatomegaly, no splenomegaly, obese EXT:  2  plus pulses throughout, trace edema, no cyanosis no clubbing SKIN:  No rashes no nodules NEURO:  Cranial nerves II through XII grossly intact, motor grossly intact throughout PSYCH:  Cognitively intact, oriented to person place and time  EKG:  Sinus rhythm, rate 78, right bundle branch block, old anteroseptal infarct, right axis deviation , QTC prolonged, no acute ST-T wave changes. 09/02/2012  ASSESSMENT AND PLAN   Chronic systolic congestive heart failure -  The patient seems to be euvolemic and doing much better. I would like to get an echocardiogram. We are still trying to get him a defibrillator and he is looking around new insurance on the new exchange. For now I will not change his medications.   CORONARY ARTERY BYPASS GRAFT, HX OF -  The patient has no new  sypmtoms. No further cardiovascular testing is indicated. We will continue with aggressive risk reduction and meds as listed.   RENAL INSUFFICIENCY -  I would check a basic metabolic profile today.   HYPERTENSION, BENIGN -  This is being managed in the context of treating his CHF

## 2012-09-03 ENCOUNTER — Telehealth: Payer: Self-pay | Admitting: Cardiology

## 2012-09-03 NOTE — Telephone Encounter (Signed)
New Problem:    Patient called in to know what the results of his blood work was.  Please call back.

## 2012-09-03 NOTE — Telephone Encounter (Signed)
Patient is aware of lab results.

## 2012-09-09 ENCOUNTER — Telehealth: Payer: Self-pay | Admitting: Cardiology

## 2012-09-09 NOTE — Telephone Encounter (Signed)
Patient has pre-existing clause with his insurance company that expires 12/08/12.   Spoke with patient and he is aware that he will be self pay for his echo tomorrow 09/10/12.

## 2012-09-10 ENCOUNTER — Ambulatory Visit (HOSPITAL_COMMUNITY): Payer: BC Managed Care – PPO | Attending: Internal Medicine | Admitting: Radiology

## 2012-09-10 DIAGNOSIS — I10 Essential (primary) hypertension: Secondary | ICD-10-CM | POA: Insufficient documentation

## 2012-09-10 DIAGNOSIS — I5022 Chronic systolic (congestive) heart failure: Secondary | ICD-10-CM

## 2012-09-10 DIAGNOSIS — E119 Type 2 diabetes mellitus without complications: Secondary | ICD-10-CM | POA: Insufficient documentation

## 2012-09-10 DIAGNOSIS — I5020 Unspecified systolic (congestive) heart failure: Secondary | ICD-10-CM | POA: Insufficient documentation

## 2012-09-10 DIAGNOSIS — I509 Heart failure, unspecified: Secondary | ICD-10-CM | POA: Insufficient documentation

## 2012-09-10 DIAGNOSIS — I2589 Other forms of chronic ischemic heart disease: Secondary | ICD-10-CM | POA: Insufficient documentation

## 2012-09-10 DIAGNOSIS — I517 Cardiomegaly: Secondary | ICD-10-CM | POA: Insufficient documentation

## 2012-09-10 NOTE — Progress Notes (Signed)
Echocardiogram performed.  

## 2012-09-29 NOTE — Telephone Encounter (Signed)
Pt has preexisting clause on insurance.  Spoke w/pt he will wait to get his ICD until clause expires.

## 2012-11-06 ENCOUNTER — Telehealth: Payer: Self-pay | Admitting: Cardiology

## 2012-11-06 NOTE — Telephone Encounter (Signed)
Pt called stating that Dr. Antoine Poche gave him some samples of Viagra 40mg  and advised him to take 1/2 tab as needed.  Pt states that the results from 20mg  is "hit and miss".  Wants to increase to a full 40mg  if ok with Dr. Antoine Poche.

## 2012-11-06 NOTE — Telephone Encounter (Signed)
Pt wants to know if he can increase his Viagra a little bit

## 2012-11-06 NOTE — Telephone Encounter (Signed)
Pt.notified

## 2012-11-06 NOTE — Telephone Encounter (Signed)
Ok to increase dose.

## 2012-12-15 ENCOUNTER — Encounter: Payer: Self-pay | Admitting: Cardiology

## 2012-12-28 ENCOUNTER — Encounter: Payer: Self-pay | Admitting: Cardiology

## 2012-12-28 ENCOUNTER — Ambulatory Visit (INDEPENDENT_AMBULATORY_CARE_PROVIDER_SITE_OTHER): Payer: BC Managed Care – PPO | Admitting: Cardiology

## 2012-12-28 VITALS — BP 130/96 | HR 76 | Ht 72.0 in | Wt 231.0 lb

## 2012-12-28 MED ORDER — CARVEDILOL 25 MG PO TABS: 50.0000 mg | ORAL_TABLET | Freq: Two times a day (BID) | ORAL | Status: DC

## 2012-12-28 NOTE — Patient Instructions (Addendum)
The current medical regimen is effective;  continue present plan and medications.  Follow up in 4 months with Dr Hochrein 

## 2012-12-28 NOTE — Progress Notes (Signed)
HPI The patient presents for followup of his ischemic cardiomyopathy. Since I last saw him he has had no new cardiovascular complaints. He continues to exercise routinely. He denies any chest pressure, neck or arm discomfort. He has no palpitations, presyncope or syncope. He has had no new SOB, PND or orthopnea.    No Known Allergies  Current Outpatient Prescriptions  Medication Sig Dispense Refill  . aspirin 325 MG tablet Take 325 mg by mouth daily.        Marland Kitchen atorvastatin (LIPITOR) 80 MG tablet TAKE ONE TABLET BY MOUTH DAILY  30 tablet  9  . carvedilol (COREG) 25 MG tablet Take 2 tablets (50 mg total) by mouth 2 (two) times daily with a meal.  120 tablet  6  . colchicine 0.6 MG tablet Take 0.6 mg by mouth daily as needed.       Marland Kitchen glipiZIDE (GLUCOTROL) 5 MG tablet Take 5 mg by mouth 2 (two) times daily before a meal.        . lisinopril (PRINIVIL,ZESTRIL) 20 MG tablet TAKE 1 TABLET BY MOUTH TWICE DAILY  60 tablet  9  . Misc Natural Products (TART CHERRY ADVANCED PO) Take 1 tablet by mouth daily as needed.       . nitroGLYCERIN (NITROSTAT) 0.4 MG SL tablet Place 1 tablet (0.4 mg total) under the tongue every 5 (five) minutes as needed for chest pain.  25 tablet  12  . potassium chloride (K-DUR) 10 MEQ tablet Take 1 tablet (10 mEq total) by mouth daily.  30 tablet  11  . torsemide (DEMADEX) 20 MG tablet Take 1 tablet (20 mg total) by mouth daily. Or as directed  60 tablet  11   No current facility-administered medications for this visit.    Past Medical History  Diagnosis Date  . Hypertension   . Diabetes mellitus   . Gout   . Chronic edema   . Obesity   . Coronary artery disease   . Ischemic cardiomyopathy     EF 25%  . CMI (chronic mesenteric ischemia)     Past Surgical History  Procedure Laterality Date  . Coronary artery bypass graft  2/12    Sequential left internal mammary graft to the mid and distal LAD, sequential sapenous vein graft to the first, second, and third  obutuse marginal branches  of the  left circumflex  coronary artery.  Saphenous vein graft to the distal right coronary artery.   ROS:  As stated in the HPI and negative for all other systems.  GENERAL:  Well appearing VS:  120/90 , 79, 242 NECK:  No jugular venous distention 45 degrees, waveform within normal limits, carotid upstroke brisk and symmetric, no bruits, no thyromegaly LUNGS:  Clear to auscultation bilaterally BACK:  No CVA tenderness CHEST:  Well healed sternotomy scar. HEART:  PMI not displaced or sustained,S1 and S2 within normal limits, no S3, no S4, no clicks, no rubs, no murmurs ABD:  Flat, positive bowel sounds normal in frequency in pitch, no bruits, no rebound, no guarding, no midline pulsatile mass, no hepatomegaly, no splenomegaly, obese EXT:  2 plus pulses throughout, trace edema, no cyanosis no clubbing, gouty changes.  SKIN:  No rashes no nodules  EKG:  Sinus rhythm, rate 76, right bundle branch block, old anteroseptal infarct, right axis deviation , QTC prolonged, no acute ST-T wave changes. 12/28/2012  ASSESSMENT AND PLAN   Chronic systolic congestive heart failure -  The patient seems to be euvolemic.  EF remains at 25%.  I will continue the meds as listed.  He is working with his insurance company to get approval of an ICD.    CORONARY ARTERY BYPASS GRAFT, HX OF -  The patient has no new sypmtoms. No further cardiovascular testing is indicated. We will continue with aggressive risk reduction and meds as listed.   RENAL INSUFFICIENCY -  Creat has been stable. No change in therapy is indicated.   HYPERTENSION, BENIGN -  This is being managed in the context of treating his CHF

## 2012-12-31 ENCOUNTER — Ambulatory Visit: Payer: BC Managed Care – PPO | Admitting: Cardiology

## 2013-02-17 ENCOUNTER — Other Ambulatory Visit: Payer: Self-pay | Admitting: Cardiology

## 2013-02-24 ENCOUNTER — Other Ambulatory Visit: Payer: Self-pay | Admitting: *Deleted

## 2013-02-24 DIAGNOSIS — I10 Essential (primary) hypertension: Secondary | ICD-10-CM

## 2013-02-24 DIAGNOSIS — I2589 Other forms of chronic ischemic heart disease: Secondary | ICD-10-CM

## 2013-02-24 MED ORDER — TORSEMIDE 20 MG PO TABS: 20.0000 mg | ORAL_TABLET | Freq: Every day | ORAL | Status: DC

## 2013-02-24 NOTE — Telephone Encounter (Signed)
Fax Received. Refill Completed. James [name] (R.M.A)   

## 2013-04-14 ENCOUNTER — Other Ambulatory Visit: Payer: Self-pay

## 2013-04-14 DIAGNOSIS — I10 Essential (primary) hypertension: Secondary | ICD-10-CM

## 2013-04-14 DIAGNOSIS — I2589 Other forms of chronic ischemic heart disease: Secondary | ICD-10-CM

## 2013-04-14 MED ORDER — NITROGLYCERIN 0.4 MG SL SUBL: 0.4000 mg | SUBLINGUAL_TABLET | SUBLINGUAL | Status: DC | PRN

## 2013-04-27 ENCOUNTER — Encounter: Payer: Self-pay | Admitting: Cardiology

## 2013-04-27 ENCOUNTER — Other Ambulatory Visit: Payer: Self-pay | Admitting: *Deleted

## 2013-04-27 ENCOUNTER — Ambulatory Visit (INDEPENDENT_AMBULATORY_CARE_PROVIDER_SITE_OTHER): Payer: BC Managed Care – PPO | Admitting: Cardiology

## 2013-04-27 VITALS — BP 118/80 | HR 80 | Ht 72.0 in | Wt 228.0 lb

## 2013-04-27 DIAGNOSIS — N289 Disorder of kidney and ureter, unspecified: Secondary | ICD-10-CM

## 2013-04-27 DIAGNOSIS — I10 Essential (primary) hypertension: Secondary | ICD-10-CM

## 2013-04-27 DIAGNOSIS — I2589 Other forms of chronic ischemic heart disease: Secondary | ICD-10-CM

## 2013-04-27 DIAGNOSIS — I5022 Chronic systolic (congestive) heart failure: Secondary | ICD-10-CM

## 2013-04-27 DIAGNOSIS — Z951 Presence of aortocoronary bypass graft: Secondary | ICD-10-CM

## 2013-04-27 DIAGNOSIS — Z79899 Other long term (current) drug therapy: Secondary | ICD-10-CM

## 2013-04-27 DIAGNOSIS — I509 Heart failure, unspecified: Secondary | ICD-10-CM

## 2013-04-27 LAB — BASIC METABOLIC PANEL
BUN: 58 mg/dL — ABNORMAL HIGH (ref 6–23)
Chloride: 103 mEq/L (ref 96–112)
Glucose, Bld: 235 mg/dL — ABNORMAL HIGH (ref 70–99)
Potassium: 4.8 mEq/L (ref 3.5–5.1)

## 2013-04-27 MED ORDER — TORSEMIDE 20 MG PO TABS: 40.0000 mg | ORAL_TABLET | Freq: Every day | ORAL | Status: DC

## 2013-04-27 MED ORDER — POTASSIUM CHLORIDE ER 10 MEQ PO TBCR: EXTENDED_RELEASE_TABLET | ORAL | Status: DC

## 2013-04-27 MED ORDER — ATORVASTATIN CALCIUM 80 MG PO TABS: ORAL_TABLET | ORAL | Status: DC

## 2013-04-27 MED ORDER — CARVEDILOL 25 MG PO TABS: 50.0000 mg | ORAL_TABLET | Freq: Two times a day (BID) | ORAL | Status: DC

## 2013-04-27 MED ORDER — LISINOPRIL 20 MG PO TABS: ORAL_TABLET | ORAL | Status: DC

## 2013-04-27 MED ORDER — NITROGLYCERIN 0.4 MG SL SUBL: 0.4000 mg | SUBLINGUAL_TABLET | SUBLINGUAL | Status: DC | PRN

## 2013-04-27 NOTE — Progress Notes (Signed)
HPI The patient presents for followup of his ischemic cardiomyopathy. Since I last saw him he has had no new cardiovascular complaints. He continues to exercise routinely. He denies any chest pressure, neck or arm discomfort. He has no palpitations, presyncope or syncope. He has had no new SOB, PND or orthopnea. His weight continues to fall as he exercises. He's not been approved for Medicare and can finally go ahead and get his ICD. His EF remains low on maximal medical therapy.  No Known Allergies  Current Outpatient Prescriptions  Medication Sig Dispense Refill  . aspirin 325 MG tablet Take 325 mg by mouth daily.        Marland Kitchen atorvastatin (LIPITOR) 80 MG tablet TAKE ONE TABLET BY MOUTH DAILY  30 tablet  9  . carvedilol (COREG) 25 MG tablet Take 2 tablets (50 mg total) by mouth 2 (two) times daily with a meal.  120 tablet  6  . colchicine 0.6 MG tablet Take 0.6 mg by mouth daily as needed.       Marland Kitchen glipiZIDE (GLUCOTROL) 5 MG tablet Take 5 mg by mouth 2 (two) times daily before a meal.        . lisinopril (PRINIVIL,ZESTRIL) 20 MG tablet TAKE 1 TABLET BY MOUTH TWICE DAILY  60 tablet  9  . Misc Natural Products (TART CHERRY ADVANCED PO) Take 1 tablet by mouth daily as needed.       . nitroGLYCERIN (NITROSTAT) 0.4 MG SL tablet Place 1 tablet (0.4 mg total) under the tongue every 5 (five) minutes as needed for chest pain.  25 tablet  2  . potassium chloride (K-DUR) 10 MEQ tablet TAKE ONE TABLET BY MOUTH DAILY  30 tablet  6  . torsemide (DEMADEX) 20 MG tablet Take 1 tablet (20 mg total) by mouth daily. Or as directed  60 tablet  6   No current facility-administered medications for this visit.    Past Medical History  Diagnosis Date  . Hypertension   . Diabetes mellitus   . Gout   . Chronic edema   . Obesity   . Coronary artery disease   . Ischemic cardiomyopathy     EF 25%  . CMI (chronic mesenteric ischemia)     Past Surgical History  Procedure Laterality Date  . Coronary artery  bypass graft  2/12    Sequential left internal mammary graft to the mid and distal LAD, sequential sapenous vein graft to the first, second, and third obutuse marginal branches  of the  left circumflex  coronary artery.  Saphenous vein graft to the distal right coronary artery.   ROS: Occasional joint pains. Otherwise as stated in the HPI and negative for all other systems.  GENERAL:  Well appearing BP 118/80  Pulse 80  Ht 6' (1.829 m)  Wt 228 lb (103.42 kg)  BMI 30.92 kg/m2 NECK:  No jugular venous distention 45 degrees, waveform within normal limits, carotid upstroke brisk and symmetric, no bruits, no thyromegaly LUNGS:  Clear to auscultation bilaterally BACK:  No CVA tenderness CHEST:  Well healed sternotomy scar. HEART:  PMI not displaced or sustained,S1 and S2 within normal limits, no S3, no S4, no clicks, no rubs, no murmurs ABD:  Flat, positive bowel sounds normal in frequency in pitch, no bruits, no rebound, no guarding, no midline pulsatile mass, no hepatomegaly, no splenomegaly, obese EXT:  2 plus pulses throughout, trace edema, no cyanosis no clubbing, gouty changes.  SKIN:  No rashes no nodules  EKG:  Sinus rhythm, rate 80, right bundle branch block, old anteroseptal infarct, right axis deviation , QTC prolonged, no acute ST-T wave changes. 04/27/2013  ASSESSMENT AND PLAN   Chronic systolic congestive heart failure -  The patient seems to be euvolemic.  EF remains at 25%.  I will continue the meds as listed.  He now is going to get Medicare as of August 1. I will refer him back to EP to discuss ICD placement.   CORONARY ARTERY BYPASS GRAFT, HX OF -  The patient has no new sypmtoms. No further cardiovascular testing is indicated. We will continue with aggressive risk reduction and meds as listed.   RENAL INSUFFICIENCY -  Creat has been stable. No change in therapy is indicated.   I will check a basic metabolic profile today.  HYPERTENSION, BENIGN -  This is being managed  in the context of treating his heart failure

## 2013-04-27 NOTE — Patient Instructions (Addendum)
Please take Torsemide 40 mg a day Continue all other medications as listed.  Please have blood work today (BMP)  Please follow up with Dr Johney Frame to discuss ICD placement.  Follow up in 6 months with Dr Antoine Poche.  You will receive a letter in the mail 2 months before you are due.  Please call us when you receive this letter to schedule your follow up appointment.

## 2013-05-04 ENCOUNTER — Other Ambulatory Visit (INDEPENDENT_AMBULATORY_CARE_PROVIDER_SITE_OTHER): Payer: BC Managed Care – PPO

## 2013-05-04 DIAGNOSIS — N289 Disorder of kidney and ureter, unspecified: Secondary | ICD-10-CM

## 2013-05-04 LAB — BASIC METABOLIC PANEL
BUN: 56 mg/dL — ABNORMAL HIGH (ref 6–23)
CO2: 19 mEq/L (ref 19–32)
Chloride: 105 mEq/L (ref 96–112)
Creatinine, Ser: 2.2 mg/dL — ABNORMAL HIGH (ref 0.4–1.5)
Glucose, Bld: 257 mg/dL — ABNORMAL HIGH (ref 70–99)

## 2013-05-06 ENCOUNTER — Telehealth: Payer: Self-pay | Admitting: Cardiology

## 2013-05-06 NOTE — Telephone Encounter (Signed)
Pt aware of results and also aware that Dr Antoine Poche has not reviewed labs at this time.  Aware he will be called with any changes as soon as we are aware of them.

## 2013-05-06 NOTE — Telephone Encounter (Signed)
New problem  Pt is calling regarding his lab work he had done.

## 2013-05-07 ENCOUNTER — Telehealth: Payer: Self-pay | Admitting: *Deleted

## 2013-05-07 ENCOUNTER — Other Ambulatory Visit: Payer: Self-pay | Admitting: *Deleted

## 2013-05-07 DIAGNOSIS — I2589 Other forms of chronic ischemic heart disease: Secondary | ICD-10-CM

## 2013-05-07 DIAGNOSIS — I10 Essential (primary) hypertension: Secondary | ICD-10-CM

## 2013-05-07 MED ORDER — LISINOPRIL 20 MG PO TABS: ORAL_TABLET | ORAL | Status: DC

## 2013-05-07 MED ORDER — TORSEMIDE 20 MG PO TABS: ORAL_TABLET | ORAL | Status: DC

## 2013-05-07 NOTE — Telephone Encounter (Signed)
Message copied by Burnell Blanks on Fri May 07, 2013  4:47 PM ------      Message from: Rollene Rotunda      Created: Thu May 06, 2013 10:32 PM       Have him reduce his torsemide to half of his current dose (I believe that he is currently taking 20 mg daily but please verify.  Stop Kdur.  Repeat BMET in 7 days. Call Mr. Eplin with the results and send results to [facility], DO       ------

## 2013-05-07 NOTE — Telephone Encounter (Signed)
Discussed with Dawayne Patricia NP and will have patient continue his Torsemide 10 mg daily and decrease his Lisinopril to 1/2 tablet twice a day, stop Kdur. Recheck BMET in 1 week. Advised patient

## 2013-05-11 NOTE — Telephone Encounter (Signed)
Please see lab results with orders

## 2013-05-14 ENCOUNTER — Ambulatory Visit (INDEPENDENT_AMBULATORY_CARE_PROVIDER_SITE_OTHER): Payer: BC Managed Care – PPO | Admitting: *Deleted

## 2013-05-14 DIAGNOSIS — I10 Essential (primary) hypertension: Secondary | ICD-10-CM

## 2013-05-14 LAB — BASIC METABOLIC PANEL
BUN: 45 mg/dL — ABNORMAL HIGH (ref 6–23)
Calcium: 8.9 mg/dL (ref 8.4–10.5)
GFR: 47.61 mL/min — ABNORMAL LOW (ref >60.00)
Glucose, Bld: 199 mg/dL — ABNORMAL HIGH (ref 70–99)

## 2013-05-25 ENCOUNTER — Other Ambulatory Visit: Payer: Self-pay

## 2013-05-26 ENCOUNTER — Other Ambulatory Visit: Payer: Self-pay | Admitting: *Deleted

## 2013-05-26 MED ORDER — LISINOPRIL 20 MG PO TABS: ORAL_TABLET | ORAL | Status: DC

## 2013-06-01 ENCOUNTER — Telehealth: Payer: Self-pay | Admitting: Cardiology

## 2013-06-01 NOTE — Telephone Encounter (Signed)
New prob  Pt states that his uric acid is up a little bit. He wants to know if it will be ok to increase his water intake? Is it ok for him to take Total Cleanse Uric Acid pill and it has 4 completely natural ingredients in it. He said he got it from the [facility].

## 2013-06-02 NOTE — Telephone Encounter (Signed)
Follow up  Pt would like to someone regarding taking a natural medication.

## 2013-06-02 NOTE — Telephone Encounter (Signed)
Spoke with patient. Pt would like to know if OK with Dr Antoine Poche if he takes Total Cleanse Uric Acid that includes four natural ingredients:  tart cherry, celery, bromamine, and one other natural ingredient that starts with a "B". Pt would also like to know if it is OK if he increases his water intake. I will forward to Dr Antoine Poche for review and recommendations.

## 2013-06-02 NOTE — Telephone Encounter (Signed)
LMTCB

## 2013-06-03 NOTE — Telephone Encounter (Signed)
F/up   Pt recall states he hasn't heard anything. Request call back from dr. Algis [name] pt of Dr. Belenda Cruise from office

## 2013-06-03 NOTE — Telephone Encounter (Signed)
I would like to know if he can find out how much potassium is in this.

## 2013-06-03 NOTE — Telephone Encounter (Signed)
Per pt - states no potassium in it  Wants to know if he can increase his water intake.  Thinks he may drink about 50 oz a day (only about 3 regular size water bottles).  Says he was told it would help decrease he uric acid.  Aware I will ask Dr Antoine Poche and call him back.

## 2013-06-04 NOTE — Telephone Encounter (Signed)
Pt aware and states understanding.

## 2013-06-04 NOTE — Telephone Encounter (Signed)
OK to both questions.

## 2013-08-26 ENCOUNTER — Other Ambulatory Visit: Payer: Self-pay

## 2013-11-25 ENCOUNTER — Ambulatory Visit (INDEPENDENT_AMBULATORY_CARE_PROVIDER_SITE_OTHER): Payer: Medicare PPO | Admitting: Cardiology

## 2013-11-25 ENCOUNTER — Encounter: Payer: Self-pay | Admitting: Cardiology

## 2013-11-25 VITALS — BP 114/82 | HR 86 | Ht 72.0 in | Wt 238.0 lb

## 2013-11-25 DIAGNOSIS — I5022 Chronic systolic (congestive) heart failure: Secondary | ICD-10-CM

## 2013-11-25 DIAGNOSIS — I2589 Other forms of chronic ischemic heart disease: Secondary | ICD-10-CM

## 2013-11-25 DIAGNOSIS — I509 Heart failure, unspecified: Secondary | ICD-10-CM

## 2013-11-25 DIAGNOSIS — Z951 Presence of aortocoronary bypass graft: Secondary | ICD-10-CM

## 2013-11-25 DIAGNOSIS — N259 Disorder resulting from impaired renal tubular function, unspecified: Secondary | ICD-10-CM

## 2013-11-25 DIAGNOSIS — I10 Essential (primary) hypertension: Secondary | ICD-10-CM

## 2013-11-25 LAB — BASIC METABOLIC PANEL
BUN: 36 mg/dL — ABNORMAL HIGH (ref 6–23)
CHLORIDE: 105 meq/L (ref 96–112)
CO2: 21 mEq/L (ref 19–32)
Calcium: 8.9 mg/dL (ref 8.4–10.5)
Creatinine, Ser: 1.6 mg/dL — ABNORMAL HIGH (ref 0.4–1.5)
GFR: 61.18 mL/min (ref >60.00)
GLUCOSE: 178 mg/dL — AB (ref 70–99)
POTASSIUM: 4.2 meq/L (ref 3.5–5.1)
SODIUM: 134 meq/L — AB (ref 135–145)

## 2013-11-25 MED ORDER — TORSEMIDE 20 MG PO TABS: 20.0000 mg | ORAL_TABLET | Freq: Every day | ORAL | Status: DC

## 2013-11-25 NOTE — Patient Instructions (Signed)
The current medical regimen is effective;  continue present plan and medications.  Please have blood work today  (BMP)  Please reschedule your appointment with Dr Johney FrameAllred to follow up.  Follow up in 6 months with Dr Antoine PocheHochrein.  You will receive a letter in the mail 2 months before you are due.  Please call us when you receive this letter to schedule your follow up appointment.

## 2013-11-25 NOTE — Progress Notes (Signed)
HPI The patient presents for followup of his ischemic cardiomyopathy. Since I last saw him he has had no new cardiovascular complaints. He continues to exercise routinely. He denies any chest pressure, neck or arm discomfort. He has no palpitations, presyncope or syncope. He has had no new SOB, PND or orthopnea. His weight continues to fall as he exercises. He's been approved for Medicare and was to see EP for consideration of an ICD.  However, he was not feeling well and canceled that appt.   His EF remains low on maximal medical therapy.  He has not tolerated med titration.    Not on File  Current Outpatient Prescriptions  Medication Sig Dispense Refill  . aspirin 325 MG tablet Take 325 mg by mouth daily.        Marland Kitchen atorvastatin (LIPITOR) 80 MG tablet TAKE ONE TABLET BY MOUTH DAILY  90 tablet  3  . carvedilol (COREG) 25 MG tablet Take 2 tablets (50 mg total) by mouth 2 (two) times daily with a meal.  360 tablet  3  . colchicine 0.6 MG tablet Take 0.6 mg by mouth daily as needed.       Marland Kitchen glipiZIDE (GLUCOTROL) 5 MG tablet Take 5 mg by mouth 2 (two) times daily before a meal.        . lisinopril (PRINIVIL,ZESTRIL) 20 MG tablet 1/2 tablet twice a day  30 tablet  0  . Misc Natural Products (TART CHERRY ADVANCED PO) Take 1 tablet by mouth daily as needed.       . nitroGLYCERIN (NITROSTAT) 0.4 MG SL tablet Place 1 tablet (0.4 mg total) under the tongue every 5 (five) minutes as needed for chest pain.  25 tablet  2  . torsemide (DEMADEX) 20 MG tablet 1/2 tablet daily  180 tablet  3   No current facility-administered medications for this visit.    Past Medical History  Diagnosis Date  . Hypertension   . Diabetes mellitus   . Gout   . Chronic edema   . Obesity   . Coronary artery disease   . Ischemic cardiomyopathy     EF 25%  . CMI (chronic mesenteric ischemia)     Past Surgical History  Procedure Laterality Date  . Coronary artery bypass graft  2/12    Sequential left internal mammary  graft to the mid and distal LAD, sequential sapenous vein graft to the first, second, and third obutuse marginal branches  of the  left circumflex  coronary artery.  Saphenous vein graft to the distal right coronary artery.   ROS: As stated in the HPI and negative for all other systems.  GENERAL:  Well appearing BP 114/82  Pulse 86  Ht 6' (1.829 m)  Wt 238 lb (107.956 kg)  BMI 32.27 kg/m2 NECK:  No jugular venous distention 45 degrees, waveform within normal limits, carotid upstroke brisk and symmetric, no bruits, no thyromegaly LUNGS:  Clear to auscultation bilaterally BACK:  No CVA tenderness CHEST:  Well healed sternotomy scar. HEART:  PMI not displaced or sustained,S1 and S2 within normal limits, no S3, no S4, no clicks, no rubs, no murmurs ABD:  Flat, positive bowel sounds normal in frequency in pitch, no bruits, no rebound, no guarding, no midline pulsatile mass, no hepatomegaly, no splenomegaly, obese EXT:  2 plus pulses throughout, trace edema, no cyanosis no clubbing, gouty changes.  SKIN:  No rashes no nodules  EKG:  Sinus rhythm, rate 86, right bundle branch block, old anteroseptal infarct, right  axis deviation , QTC prolonged, no acute ST-T wave changes. 11/25/2013  ASSESSMENT AND PLAN   Chronic systolic congestive heart failure -  The patient seems to be euvolemic.  EF remains at 25%.  I will continue the meds as listed. I will again refer him back to EP to discuss ICD placement.   CORONARY ARTERY BYPASS GRAFT, HX OF -  The patient has no new sypmtoms. No further cardiovascular testing is indicated. We will continue with aggressive risk reduction and meds as listed.   RENAL INSUFFICIENCY -  Creat has been stable. No change in therapy is indicated.   I will check a basic metabolic profile today.  HYPERTENSION, BENIGN -  This is being managed in the context of treating his heart failure

## 2013-12-30 ENCOUNTER — Ambulatory Visit: Payer: Medicare PPO | Admitting: Internal Medicine

## 2014-02-11 ENCOUNTER — Ambulatory Visit (INDEPENDENT_AMBULATORY_CARE_PROVIDER_SITE_OTHER): Payer: Medicare PPO | Admitting: Internal Medicine

## 2014-02-11 ENCOUNTER — Encounter: Payer: Self-pay | Admitting: Internal Medicine

## 2014-02-11 VITALS — BP 111/73 | HR 79 | Ht 72.0 in | Wt 244.0 lb

## 2014-02-11 DIAGNOSIS — Z951 Presence of aortocoronary bypass graft: Secondary | ICD-10-CM

## 2014-02-11 DIAGNOSIS — I509 Heart failure, unspecified: Secondary | ICD-10-CM

## 2014-02-11 DIAGNOSIS — I2589 Other forms of chronic ischemic heart disease: Secondary | ICD-10-CM

## 2014-02-11 DIAGNOSIS — I5022 Chronic systolic (congestive) heart failure: Secondary | ICD-10-CM

## 2014-02-11 DIAGNOSIS — I255 Ischemic cardiomyopathy: Secondary | ICD-10-CM

## 2014-02-11 NOTE — Patient Instructions (Addendum)
Your physician recommends that you continue on your current medications as directed. Please refer to the Current Medication list given to you today.  Please call  Dennis BastKelly Lanier, RN when you have made a decision for ICD.   [phone]

## 2014-02-13 NOTE — Progress Notes (Signed)
Primary Care Physician: Joaquin CourtsFAVERO,JOHN PATRICK, DO Referring Physician: Dr Fanny DanceHochrein   James [name] is a 53 y.o. male with a h/o CAD s/p CABG 2/12 and chronic systolic dysfunction with biventricular failure who presents today for EP follow discussions regarding ICD implantation.  He was diagnosed with CHF 1/12.  Cath at that time revealed severe ischemic cardiomyopathy with total occlusion of the left anterior descending, total occlusion of the right coronary artery, and severe disease involving the remaining circumflex vessel.  He therefore underwent CABG (6v) by Dr Laneta SimmersBartle 12/05/10.   He has been followed by Dr Antoine PocheHochrein and placed on an optimal medical regimen.   I saw him in consultation in 2012 and he was offered ICD therapy.  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  Financial concerns were also raised by him regarding insurance also.  He has done reasonably well since that time.  Last echo 11/13 revealed EF 25%. He continues to feel reasonably well and remains active.  He has SOB with moderate activity.  He has occasional edema.  He denies symptoms of palpitations, chest pain,orthopnea, PND,  dizziness, presyncope, syncope, or neurologic sequela. The patient is tolerating medications without difficulties and is otherwise without complaint today.  He continues to have a job which required [employer] drivers license.  He is not ready to retire or change employment.  Past Medical History  Diagnosis Date  . Hypertension   . Diabetes mellitus   . Gout   . Chronic edema   . Obesity   . Coronary artery disease   . Ischemic cardiomyopathy     EF 25%  . CMI (chronic mesenteric ischemia)    Past Surgical History  Procedure Laterality Date  . Coronary artery bypass graft  2/12    Sequential left internal mammary graft to the mid and distal LAD, sequential sapenous vein graft to the first, second, and third obutuse marginal branches  of the   left circumflex  coronary artery.  Saphenous vein graft to the distal right coronary artery.    Current Outpatient Prescriptions  Medication Sig Dispense Refill  . aspirin 325 MG tablet Take 325 mg by mouth daily.        Marland Kitchen. atorvastatin (LIPITOR) 80 MG tablet TAKE ONE TABLET BY MOUTH DAILY  90 tablet  3  . carvedilol (COREG) 25 MG tablet Take 2 tablets (50 mg total) by mouth 2 (two) times daily with a meal.  360 tablet  3  . colchicine 0.6 MG tablet Take 0.6 mg by mouth daily as needed.       Marland Kitchen. glipiZIDE (GLUCOTROL) 5 MG tablet Take 5 mg by mouth 2 (two) times daily before a meal.        . lisinopril (PRINIVIL,ZESTRIL) 20 MG tablet 1/2 tablet twice a day  30 tablet  0  . Misc Natural Products (TART CHERRY ADVANCED PO) Take 1 tablet by mouth daily as needed.       . nitroGLYCERIN (NITROSTAT) 0.4 MG SL tablet Place 1 tablet (0.4 mg total) under the tongue every 5 (five) minutes as needed for chest pain.  25 tablet  2  . torsemide (DEMADEX) 20 MG tablet Take 1 tablet (20 mg total) by mouth daily.  90 tablet  3   No current facility-administered medications for this visit.    No Known Allergies  History   Social History  . Marital Status: Married    Spouse  Name: N/A    Number of Children: N/A  . Years of Education: N/A   Occupational History  . Truck Advertising copywriterDriver     Instructional driver   Social History Main Topics  . Smoking status: Never Smoker   . Smokeless tobacco: Never Used  . Alcohol Use: Yes     Comment: occasional  . Drug Use: No  . Sexual Activity: Not on file   Other Topics Concern  . Not on file   Social History Narrative   [place].   Worked as a SolicitorTruck Driving Instructor, though now on disability    Family History  Problem Relation Age of Onset  . Diabetes Other   . Hypertension Other     ROS- All systems are reviewed and negative except as per the HPI above  Physical Exam: Filed Vitals:   02/11/14 1207  BP: 111/73  Pulse:  79  Height: 6' (1.829 m)  Weight: 244 lb (110.678 kg)    GEN- The patient is overweight appearing, alert and oriented x 3 today.   Head- normocephalic, atraumatic Eyes-  Sclera clear, conjunctiva pink Ears- hearing intact Oropharynx- clear Neck- supple, no JVP Lymph- no cervical lymphadenopathy Lungs- Clear to ausculation bilaterally, normal work of breathing Heart- Regular rate and rhythm, wide S2 split GI- soft, NT, ND, + BS Extremities- no clubbing, cyanosis, 3+ edema MS- no significant deformity or atrophy Skin- no rash or lesion Psych- euthymic mood, full affect Neuro- strength and sensation are intact  EKG today reveals sinus rhythm 86 BPM, RBBB/LAHB (QRS 160ms), [facility] Echo 2013 is reviewed Epic records including my prior note and Dr Lindaann SloughHochreins notes are reviewed  Assessment and Plan:  The patient has an ischemic CM (EF 25%), NYHA Class II/III CHF, and CAD.  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 At this time, he meets MADIT II/ SCD-HeFT criteria for ICD implantation for primary prevention of sudden death.  He is also a candidate for CRT. Risks, benefits, alternatives to BiV ICD implantation were discussed in detail with the patient today.  We had a long discussion about restrictions related to having an ICD and his commercial drivers license.  As he is not ready to retire or switch jobs, this presents a challenge.  He says he is required to maintain a CDL for his job. I have encouraged him to discuss this with his employer to see if they would be willing to make an exception for him.  He will contemplate his options.  If he decides to proceed with ICD implantation then I am happy to assist.  In the interim, he will continue to follow with Dr Antoine PocheHochrein.  Should he decide to proceed with an ICD then we will need to update his echo.  He will need an echo < 6 months from time of implantation with EF <35%.

## 2014-03-25 ENCOUNTER — Other Ambulatory Visit: Payer: Self-pay | Admitting: *Deleted

## 2014-03-25 MED ORDER — ATORVASTATIN CALCIUM 80 MG PO TABS: ORAL_TABLET | ORAL | Status: DC

## 2014-06-02 ENCOUNTER — Other Ambulatory Visit: Payer: Self-pay | Admitting: Nurse Practitioner

## 2014-06-03 ENCOUNTER — Other Ambulatory Visit: Payer: Self-pay

## 2014-06-03 MED ORDER — ATORVASTATIN CALCIUM 80 MG PO TABS: ORAL_TABLET | ORAL | Status: DC

## 2014-06-13 ENCOUNTER — Other Ambulatory Visit: Payer: Self-pay | Admitting: *Deleted

## 2014-06-13 DIAGNOSIS — I2589 Other forms of chronic ischemic heart disease: Secondary | ICD-10-CM

## 2014-06-13 MED ORDER — CARVEDILOL 25 MG PO TABS: 50.0000 mg | ORAL_TABLET | Freq: Two times a day (BID) | ORAL | Status: DC

## 2014-07-01 ENCOUNTER — Ambulatory Visit (INDEPENDENT_AMBULATORY_CARE_PROVIDER_SITE_OTHER): Payer: Medicare PPO | Admitting: Cardiology

## 2014-07-01 ENCOUNTER — Encounter: Payer: Self-pay | Admitting: Cardiology

## 2014-07-01 VITALS — BP 116/76 | HR 67 | Ht 72.0 in | Wt 241.7 lb

## 2014-07-01 DIAGNOSIS — E878 Other disorders of electrolyte and fluid balance, not elsewhere classified: Secondary | ICD-10-CM

## 2014-07-01 DIAGNOSIS — I255 Ischemic cardiomyopathy: Secondary | ICD-10-CM

## 2014-07-01 DIAGNOSIS — I2589 Other forms of chronic ischemic heart disease: Secondary | ICD-10-CM

## 2014-07-01 LAB — BASIC METABOLIC PANEL
BUN: 36 mg/dL — ABNORMAL HIGH (ref 6–23)
CHLORIDE: 106 meq/L (ref 96–112)
CO2: 22 meq/L (ref 19–32)
CREATININE: 1.47 mg/dL — AB (ref 0.50–1.35)
Calcium: 8.6 mg/dL (ref 8.4–10.5)
GLUCOSE: 138 mg/dL — AB (ref 70–99)
Potassium: 4.4 mEq/L (ref 3.5–5.3)
SODIUM: 137 meq/L (ref 135–145)

## 2014-07-01 NOTE — Patient Instructions (Signed)
Your physician recommends that you schedule a follow-up appointment in: 6 months  Have your labs done today

## 2014-07-01 NOTE — Progress Notes (Signed)
HPI The patient presents for followup of his ischemic cardiomyopathy. Since I last saw him he has had no new cardiovascular complaints. He continues to exercise routinely. He denies any chest pressure, neck or arm discomfort. He has no palpitations, presyncope or syncope. He has had no new SOB, PND or orthopnea. He continues to exercises. He has not tolerated further med titration in the past.   No Known Allergies  Current Outpatient Prescriptions  Medication Sig Dispense Refill  . aspirin 325 MG tablet Take 325 mg by mouth daily.        Marland Kitchen atorvastatin (LIPITOR) 80 MG tablet TAKE ONE TABLET BY MOUTH DAILY  90 tablet  0  . carvedilol (COREG) 25 MG tablet Take 2 tablets (50 mg total) by mouth 2 (two) times daily with a meal.  40 tablet  0  . colchicine 0.6 MG tablet Take 0.6 mg by mouth daily as needed.       Marland Kitchen glipiZIDE (GLUCOTROL) 5 MG tablet Take 5 mg by mouth 2 (two) times daily before a meal.        . lisinopril (PRINIVIL,ZESTRIL) 20 MG tablet TAKE 1/2 TABLET TWICE DAILY  90 tablet  0  . Misc Natural Products (TART CHERRY ADVANCED PO) Take 1 tablet by mouth daily as needed.       . nitroGLYCERIN (NITROSTAT) 0.4 MG SL tablet Place 1 tablet (0.4 mg total) under the tongue every 5 (five) minutes as needed for chest pain.  25 tablet  2  . torsemide (DEMADEX) 20 MG tablet Take 1 tablet (20 mg total) by mouth daily.  90 tablet  3   No current facility-administered medications for this visit.    Past Medical History  Diagnosis Date  . Hypertension   . Diabetes mellitus   . Gout   . Chronic edema   . Obesity   . Coronary artery disease   . Ischemic cardiomyopathy     EF 25%  . CMI (chronic mesenteric ischemia)     Past Surgical History  Procedure Laterality Date  . Coronary artery bypass graft  2/12    Sequential left internal mammary graft to the mid and distal LAD, sequential sapenous vein graft to the first, second, and third obutuse marginal branches  of the  left circumflex   coronary artery.  Saphenous vein graft to the distal right coronary artery.   ROS: As stated in the HPI and negative for all other systems.  GENERAL:  Well appearing BP 116/76  Pulse 67  Ht 6' (1.829 m)  Wt 241 lb 11.2 oz (109.634 kg)  BMI 32.77 kg/m2 NECK:  6 cm jugular venous distention 45 degrees, waveform within normal limits, carotid upstroke brisk and symmetric, no bruits, no thyromegaly LUNGS:  Clear to auscultation bilaterally BACK:  No CVA tenderness CHEST:  Well healed sternotomy scar. HEART:  PMI not displaced or sustained,S1 and S2 within normal limits, no S3, no S4, no clicks, no rubs, no murmurs ABD:  Flat, positive bowel sounds normal in frequency in pitch, no bruits, no rebound, no guarding, no midline pulsatile mass, no hepatomegaly, no splenomegaly, obese EXT:  2 plus pulses throughout, moderate ankle edema, no cyanosis no clubbing, gouty changes.  SKIN:  No rashes no nodules  EKG:  Sinus rhythm, rate 67, right bundle branch block, old anteroseptal infarct, right axis deviation , QTC prolonged, no acute ST-T wave changes. 07/01/2014  ASSESSMENT AND PLAN   Chronic systolic congestive heart failure -  The patient seems to  be euvolemic.   I will continue the meds as listed.   CORONARY ARTERY BYPASS GRAFT, HX OF -  The patient has no new sypmtoms. No further cardiovascular testing is indicated. We will continue with aggressive risk reduction and meds as listed.   RENAL INSUFFICIENCY -  Creat has been stable. No change in therapy is indicated.   I will check a basic metabolic profile today.  HYPERTENSION, BENIGN -  This is being managed in the context of treating his heart failure

## 2014-07-04 NOTE — Addendum Note (Signed)
Addended by: Vincenza Hews on: 07/04/2014 02:27 PM   Modules accepted: Orders

## 2014-08-16 ENCOUNTER — Other Ambulatory Visit: Payer: Self-pay | Admitting: Cardiology

## 2014-08-18 ENCOUNTER — Other Ambulatory Visit: Payer: Self-pay | Admitting: *Deleted

## 2014-08-18 ENCOUNTER — Telehealth: Payer: Self-pay | Admitting: Cardiology

## 2014-08-18 DIAGNOSIS — I259 Chronic ischemic heart disease, unspecified: Secondary | ICD-10-CM

## 2014-08-18 DIAGNOSIS — I10 Essential (primary) hypertension: Secondary | ICD-10-CM

## 2014-08-18 MED ORDER — LISINOPRIL 20 MG PO TABS: ORAL_TABLET | ORAL | Status: DC

## 2014-08-18 MED ORDER — ATORVASTATIN CALCIUM 80 MG PO TABS: ORAL_TABLET | ORAL | Status: DC

## 2014-08-18 NOTE — Telephone Encounter (Signed)
Need a new prescription for Atorvastatin 80 mg #90,Torsemide 20 mg #90,Glipizide 5 mg #180 and Lisinopril 20 mg #90 and please with refills.

## 2014-08-19 MED ORDER — TORSEMIDE 20 MG PO TABS: 20.0000 mg | ORAL_TABLET | Freq: Every day | ORAL | Status: DC

## 2014-08-19 MED ORDER — GLIPIZIDE 5 MG PO TABS: 5.0000 mg | ORAL_TABLET | Freq: Two times a day (BID) | ORAL | Status: DC

## 2014-08-19 NOTE — Telephone Encounter (Signed)
Refills sent electronically to [facility]

## 2014-08-26 ENCOUNTER — Other Ambulatory Visit: Payer: Self-pay | Admitting: Cardiology

## 2014-08-26 DIAGNOSIS — I5022 Chronic systolic (congestive) heart failure: Secondary | ICD-10-CM

## 2014-08-26 DIAGNOSIS — I10 Essential (primary) hypertension: Secondary | ICD-10-CM

## 2014-08-26 MED ORDER — NITROGLYCERIN 0.4 MG SL SUBL: 0.4000 mg | SUBLINGUAL_TABLET | SUBLINGUAL | Status: DC | PRN

## 2014-09-09 ENCOUNTER — Telehealth: Payer: Self-pay | Admitting: Cardiology

## 2014-09-09 NOTE — Telephone Encounter (Signed)
Pt. Wants to speak with you about his medications and wanted to know if it ok to take prednisone 5 mg with all of his other meds

## 2014-09-09 NOTE — Telephone Encounter (Signed)
New Message        Pt has questions about medications that another doctor wants him to take and pt wants to consult with Dr. Antoine PocheHochrein about medications. He is suppose to start taking 5mg  of Prednisone daily. Please call pt and advise. If pt doesn't pick up he states it is ok to leave a detailed message in regards to Dr. Jenene SlickerHochrein's response.

## 2014-09-11 NOTE — Telephone Encounter (Signed)
He can use prednisone.  He might retain fluid and should keep a very close eye on his volume status.

## 2014-09-12 NOTE — Telephone Encounter (Signed)
Pt. Informed about prednisone instructions

## 2014-12-14 ENCOUNTER — Telehealth: Payer: Self-pay | Admitting: Cardiology

## 2014-12-14 NOTE — Telephone Encounter (Signed)
Received note from Dr Stacey DrainWilliam Truslow for appointment with Dr Antoine PocheHochrein on 01/16/15.  [facility] Dr Hochrein's schedule on 01/16/15.  lp

## 2015-01-03 ENCOUNTER — Other Ambulatory Visit: Payer: Self-pay

## 2015-01-03 ENCOUNTER — Telehealth: Payer: Self-pay | Admitting: Cardiology

## 2015-01-03 MED ORDER — CARVEDILOL 25 MG PO TABS: ORAL_TABLET | ORAL | Status: DC

## 2015-01-03 MED ORDER — ATORVASTATIN CALCIUM 80 MG PO TABS: ORAL_TABLET | ORAL | Status: DC

## 2015-01-03 MED ORDER — LISINOPRIL 20 MG PO TABS: ORAL_TABLET | ORAL | Status: DC

## 2015-01-03 NOTE — Telephone Encounter (Signed)
Spoke to [employer] pharmacist with [facility].90 day refills called in for coreg,lisinopril,atorvastatin

## 2015-01-03 NOTE — Telephone Encounter (Signed)
Pt said he was told by his pharmacist to call and see if you had received a fax for 2 of his medicine. Pt said they did not tell him which two they had faxed.

## 2015-01-04 ENCOUNTER — Other Ambulatory Visit: Payer: Self-pay

## 2015-01-04 MED ORDER — LISINOPRIL 20 MG PO TABS
ORAL_TABLET | ORAL | Status: DC
Start: 2015-01-04 — End: 2015-10-02

## 2015-01-04 MED ORDER — CARVEDILOL 25 MG PO TABS: ORAL_TABLET | ORAL | Status: DC

## 2015-01-12 ENCOUNTER — Telehealth: Payer: Self-pay | Admitting: Cardiology

## 2015-01-12 NOTE — Telephone Encounter (Signed)
Spoke with pt, weight and bp from last office visit given to patient.

## 2015-01-12 NOTE — Telephone Encounter (Signed)
Pt wants to know what his blood pressure and weight was on his office visit 07-01-14 please.

## 2015-01-16 ENCOUNTER — Ambulatory Visit (INDEPENDENT_AMBULATORY_CARE_PROVIDER_SITE_OTHER): Payer: Medicare PPO | Admitting: Cardiology

## 2015-01-16 ENCOUNTER — Encounter: Payer: Self-pay | Admitting: Cardiology

## 2015-01-16 VITALS — BP 100/60 | HR 60 | Ht 71.0 in | Wt 234.5 lb

## 2015-01-16 DIAGNOSIS — I10 Essential (primary) hypertension: Secondary | ICD-10-CM

## 2015-01-16 DIAGNOSIS — I5022 Chronic systolic (congestive) heart failure: Secondary | ICD-10-CM

## 2015-01-16 NOTE — Patient Instructions (Signed)
Your physician wants you to follow-up in: 6 MONTHS WITH DR Antoine PocheHOCHREIN You will receive a reminder letter in the mail two months in advance. If you don't receive a letter, please call our office to schedule the follow-up appointment.  Your physician has requested that you have an echocardiogram. Echocardiography is a painless test that uses sound waves to create images of your heart. It provides your doctor with information about the size and shape of your heart and how well your heart's chambers and valves are working. This procedure takes approximately one hour. There are no restrictions for this procedure.PRIOR TO FOLLOW UP APPT IN 6 MONTHS

## 2015-01-16 NOTE — Progress Notes (Signed)
HPI The patient presents for followup of his ischemic cardiomyopathy. Since I last saw him he has had no new cardiovascular complaints. He continues to exercise routinely. He denies any chest pressure, neck or arm discomfort. He has no palpitations, presyncope or syncope. He has had no new SOB, PND or orthopnea. He is disappointed because he doesn't lose weight.  No Known Allergies  Current Outpatient Prescriptions  Medication Sig Dispense Refill  . aspirin 325 MG tablet Take 325 mg by mouth daily.      Marland Kitchen atorvastatin (LIPITOR) 80 MG tablet TAKE ONE TABLET BY MOUTH DAILY 90 tablet 3  . carvedilol (COREG) 25 MG tablet Take 2 tablets twice a day with meals 120 tablet 1  . colchicine 0.6 MG tablet Take 0.6 mg by mouth daily as needed.     Marland Kitchen glipiZIDE (GLUCOTROL) 5 MG tablet Take 1 tablet (5 mg total) by mouth 2 (two) times daily before a meal. 180 tablet 3  . lisinopril (PRINIVIL,ZESTRIL) 20 MG tablet TAKE 1/2 TABLET TWICE DAILY 60 tablet 1  . Misc Natural Products (TART CHERRY ADVANCED PO) Take 1 tablet by mouth daily as needed.     . nitroGLYCERIN (NITROSTAT) 0.4 MG SL tablet Place 1 tablet (0.4 mg total) under the tongue every 5 (five) minutes as needed for chest pain. 25 tablet 2  . torsemide (DEMADEX) 20 MG tablet Take 1 tablet (20 mg total) by mouth daily. 90 tablet 3   No current facility-administered medications for this visit.    Past Medical History  Diagnosis Date  . Hypertension   . Diabetes mellitus   . Gout   . Chronic edema   . Obesity   . Coronary artery disease   . Ischemic cardiomyopathy     EF 25%  . CMI (chronic mesenteric ischemia)     Past Surgical History  Procedure Laterality Date  . Coronary artery bypass graft  2/12    Sequential left internal mammary graft to the mid and distal LAD, sequential sapenous vein graft to the first, second, and third obutuse marginal branches  of the  left circumflex  coronary artery.  Saphenous vein graft to the distal right  coronary artery.   ROS: As stated in the HPI and negative for all other systems.  GENERAL:  Well appearing BP 100/60 mmHg  Pulse 60  Ht  (1.803 m)  Wt 234 lb 8 oz (106.369 kg)  BMI 32.72 kg/m2 NECK:  6 cm jugular venous distention 45 degrees, waveform within normal limits, carotid upstroke brisk and symmetric, no bruits, no thyromegaly LUNGS:  Clear to auscultation bilaterally BACK:  No CVA tenderness CHEST:  Well healed sternotomy scar. HEART:  PMI not displaced or sustained,S1 and S2 within normal limits, no S3, no S4, no clicks, no rubs, no murmurs ABD:  Flat, positive bowel sounds normal in frequency in pitch, no bruits, no rebound, no guarding, no midline pulsatile mass, no hepatomegaly, no splenomegaly, obese EXT:  2 plus pulses throughout, moderate ankle edema, no cyanosis no clubbing, gouty changes.  SKIN:  No rashes no nodules   ASSESSMENT AND PLAN   Chronic systolic congestive heart failure -  The patient seems to be euvolemic.   I will continue the meds as listed. I will get an echocardiogram next time he comes back.  CORONARY ARTERY BYPASS GRAFT, HX OF -  The patient has no new sypmtoms. No further cardiovascular testing is indicated. We will continue with aggressive risk reduction and meds as listed.  RENAL INSUFFICIENCY -  He said this was checked recently and stable. I don't have these labs.  HYPERTENSION, BENIGN -  This is being managed in the context of treating his heart failure

## 2015-05-08 ENCOUNTER — Other Ambulatory Visit: Payer: Self-pay | Admitting: Cardiology

## 2015-05-08 NOTE — Telephone Encounter (Signed)
REFILL 

## 2015-05-12 ENCOUNTER — Telehealth: Payer: Self-pay | Admitting: Cardiology

## 2015-05-12 NOTE — Telephone Encounter (Signed)
Pt states no recent med chgs. Concerned d/t cramping in hands ~3-4 days. Notes historic use of potassium w/ diuretics, no longer using and has not needed for a while. Wonders if this could be cause.  He notes recent increase in exercise.  Advised sports drinks w/ prolonged exercise d/t potential for electrolyte imbalance.  Advised f/u w/ PCP if OTC muscle rub, rehydration, etc not helpful.  Pt voiced understanding.

## 2015-05-12 NOTE — Telephone Encounter (Signed)
New Prob    Pt reports recent cramps in hands bilaterally x 4 days. Requesting to speak to a nurse regarding possible solutions. Please call/OK to leave detailed VM.

## 2015-06-09 ENCOUNTER — Encounter: Payer: Self-pay | Admitting: Cardiology

## 2015-07-12 ENCOUNTER — Telehealth: Payer: Self-pay | Admitting: Cardiology

## 2015-07-12 NOTE — Telephone Encounter (Signed)
Tammy at [facility] called to get info on pt's last EF, OV. Information provided, she verbalized understanding.

## 2015-07-14 ENCOUNTER — Telehealth: Payer: Self-pay | Admitting: Cardiology

## 2015-07-14 NOTE — Telephone Encounter (Signed)
New Message  Pt has echo sched for 10/3 and OV 10/6. Pt wanted to  Make sure that Dr Antoine Poche wanted him to have Echo since he has to commute from [place] for every appt. Can pt have appts on same day? Please call back and discuss.

## 2015-07-14 NOTE — Telephone Encounter (Signed)
Can we schedule his echo the same day as his appointment with Dr. Antoine Poche?  It is much easier to adjust his echo then to try to get an OV that correlates with the echo.

## 2015-07-17 ENCOUNTER — Other Ambulatory Visit: Payer: Self-pay | Admitting: Cardiology

## 2015-07-18 ENCOUNTER — Telehealth: Payer: Self-pay | Admitting: Cardiology

## 2015-07-18 NOTE — Telephone Encounter (Signed)
Patient told that it was ok to take the [facility]

## 2015-07-18 NOTE — Telephone Encounter (Signed)
  Pt called in wanting to speak with Dr. Antoine Poche about a medication his Rheumatologist wanted his to start taking. It is Uloric  to lower his uric acid levels. He wants to know if this is all right with the cardiololgist       Routed to [facility] and Dr. Antoine Poche

## 2015-07-18 NOTE — Telephone Encounter (Signed)
Pt called in wanting to speak with Dr. Antoine Poche about a medication his Rheumatologist wanted his to start taking. It is Uloric   to lower his uric acid levels. He wants to know if this is all right with the cardiololgist   Thanks

## 2015-07-18 NOTE — Telephone Encounter (Signed)
Uloric fine with his medication list

## 2015-07-20 NOTE — Telephone Encounter (Signed)
No contraindication. 

## 2015-07-21 NOTE — Telephone Encounter (Signed)
Rx has been sent to the pharmacy electronically. ° °

## 2015-07-24 ENCOUNTER — Other Ambulatory Visit (HOSPITAL_COMMUNITY): Payer: Medicare PPO

## 2015-07-26 ENCOUNTER — Telehealth: Payer: Self-pay | Admitting: Cardiology

## 2015-07-26 NOTE — Telephone Encounter (Signed)
Called pt and left message informing him that we needed to update his family history and to call us back and if not we will get it when he comes in on the day of his appointment, 07/27/15.

## 2015-07-27 ENCOUNTER — Ambulatory Visit (HOSPITAL_COMMUNITY): Payer: Medicare PPO | Attending: Cardiovascular Disease

## 2015-07-27 ENCOUNTER — Other Ambulatory Visit: Payer: Self-pay | Admitting: Cardiology

## 2015-07-27 ENCOUNTER — Encounter: Payer: Self-pay | Admitting: Cardiology

## 2015-07-27 ENCOUNTER — Other Ambulatory Visit: Payer: Self-pay

## 2015-07-27 ENCOUNTER — Ambulatory Visit (INDEPENDENT_AMBULATORY_CARE_PROVIDER_SITE_OTHER): Payer: Medicare PPO | Admitting: Cardiology

## 2015-07-27 VITALS — BP 122/78 | HR 80 | Ht 71.0 in | Wt 242.0 lb

## 2015-07-27 DIAGNOSIS — Z6832 Body mass index (BMI) 32.0-32.9, adult: Secondary | ICD-10-CM | POA: Insufficient documentation

## 2015-07-27 DIAGNOSIS — Z951 Presence of aortocoronary bypass graft: Secondary | ICD-10-CM | POA: Diagnosis not present

## 2015-07-27 DIAGNOSIS — I340 Nonrheumatic mitral (valve) insufficiency: Secondary | ICD-10-CM | POA: Diagnosis not present

## 2015-07-27 DIAGNOSIS — E119 Type 2 diabetes mellitus without complications: Secondary | ICD-10-CM | POA: Diagnosis not present

## 2015-07-27 DIAGNOSIS — I371 Nonrheumatic pulmonary valve insufficiency: Secondary | ICD-10-CM | POA: Diagnosis not present

## 2015-07-27 DIAGNOSIS — E669 Obesity, unspecified: Secondary | ICD-10-CM | POA: Insufficient documentation

## 2015-07-27 DIAGNOSIS — I517 Cardiomegaly: Secondary | ICD-10-CM | POA: Diagnosis not present

## 2015-07-27 DIAGNOSIS — I5022 Chronic systolic (congestive) heart failure: Secondary | ICD-10-CM

## 2015-07-27 DIAGNOSIS — E785 Hyperlipidemia, unspecified: Secondary | ICD-10-CM | POA: Diagnosis not present

## 2015-07-27 DIAGNOSIS — I10 Essential (primary) hypertension: Secondary | ICD-10-CM

## 2015-07-27 NOTE — Progress Notes (Signed)
HPI The patient presents for followup of his ischemic cardiomyopathy. Since I last saw him he has had no new cardiovascular complaints. His biggest problem has been with gout.  He continues to exercise routinely. He denies any chest pressure, neck or arm discomfort. He has no palpitations, presyncope or syncope. He has had no new SOB, PND or orthopnea.  I did review a basic metabolic profile and his BUN was 51. His creatinine was up slightly from his baseline.  No Known Allergies  Current Outpatient Prescriptions  Medication Sig Dispense Refill  . aspirin 325 MG tablet Take 325 mg by mouth daily.      Marland Kitchen atorvastatin (LIPITOR) 80 MG tablet TAKE ONE TABLET BY MOUTH DAILY 90 tablet 3  . carvedilol (COREG) 25 MG tablet Take 2 tablets twice a day with meals 120 tablet 1  . colchicine 0.6 MG tablet Take 0.6 mg by mouth daily as needed.     Marland Kitchen glipiZIDE (GLUCOTROL) 5 MG tablet Take 1 tablet (5 mg total) by mouth 2 (two) times daily before a meal. 180 tablet 3  . lisinopril (PRINIVIL,ZESTRIL) 20 MG tablet TAKE 1/2 TABLET TWICE DAILY 60 tablet 1  . Misc Natural Products (TART CHERRY ADVANCED PO) Take 1 tablet by mouth daily as needed.     . nitroGLYCERIN (NITROSTAT) 0.4 MG SL tablet Place 1 tablet (0.4 mg total) under the tongue every 5 (five) minutes as needed for chest pain. 25 tablet 2  . torsemide (DEMADEX) 20 MG tablet TAKE 1 TABLET EVERY DAY 90 tablet 0  . ULORIC 80 MG TABS Take 1 tablet by mouth daily.  2   No current facility-administered medications for this visit.    Past Medical History  Diagnosis Date  . Hypertension   . Diabetes mellitus   . Gout   . Chronic edema   . Obesity   . Coronary artery disease   . Ischemic cardiomyopathy     EF 25%  . CMI (chronic mesenteric ischemia) [facility])     Past Surgical History  Procedure Laterality Date  . Coronary artery bypass graft  2/12    Sequential left internal mammary graft to the mid and distal LAD, sequential sapenous vein graft  to the first, second, and third obutuse marginal branches  of the  left circumflex  coronary artery.  Saphenous vein graft to the distal right coronary artery.   ROS: As stated in the HPI and negative for all other systems.  GENERAL:  Well appearing BP 122/78 mmHg  Pulse 80  Ht  (1.803 m)  Wt 242 lb (109.77 kg)  BMI 33.77 kg/m2 NECK:  6 cm jugular venous distention 45 degrees, waveform within normal limits, carotid upstroke brisk and symmetric, no bruits, no thyromegaly LUNGS:  Clear to auscultation bilaterally BACK:  No CVA tenderness CHEST:  Well healed sternotomy scar. HEART:  PMI not displaced or sustained,S1 and S2 within normal limits, no S3, no S4, no clicks, no rubs, no murmurs ABD:  Flat, positive bowel sounds normal in frequency in pitch, no bruits, no rebound, no guarding, no midline pulsatile mass, no hepatomegaly, no splenomegaly, obese EXT:  2 plus pulses throughout, trace ankle edema, no cyanosis no clubbing, gouty changes.  SKIN:  No rashes no nodules  EKG:  Sinus rhythm, rate 80, right bundle branch block, no change from previous. 07/27/2015   ASSESSMENT AND PLAN   Chronic systolic congestive heart failure -  The patient seems to be euvolemic.   I will continue  the meds as listed. I will get an echocardiogram today.  His insurance does not allow an ICD.   CORONARY ARTERY BYPASS GRAFT, HX OF -  The patient has no new sypmtoms. No further cardiovascular testing is indicated. We will continue with aggressive risk reduction and meds as listed.   RENAL INSUFFICIENCY -  We do run his kidney slightly dry to help his breathing. He is getting a basic metabolic profile next month. We will follow this closely.  HYPERTENSION, BENIGN -  This is being managed in the context of treating his heart failure  DYSLIPIDEMIA - We gave him written instructions for lipid profile.

## 2015-07-27 NOTE — Patient Instructions (Signed)
Your physician wants you to follow-up in: 6 Months. You will receive a reminder letter in the mail two months in advance. If you don't receive a letter, please call our office to schedule the follow-up appointment.  Your physician has recommended you make the following change in your medication: Reduce Aspirin 81 mg daily  Your physician recommends that you return for lab work Fasting lipids

## 2015-07-28 ENCOUNTER — Telehealth: Payer: Self-pay | Admitting: Cardiology

## 2015-07-28 NOTE — Telephone Encounter (Signed)
Pt have some questions about his ekg that he had yesterday.If not there call-[phone].

## 2015-07-28 NOTE — Telephone Encounter (Signed)
Results discussed w/ patient - no further questions.

## 2015-07-28 NOTE — Telephone Encounter (Signed)
Spoke with pt, aware of the comments by dr hochrein regarding EKG.

## 2015-07-28 NOTE — Telephone Encounter (Signed)
Pt is calling back in to ask Dr. Antoine Poche an important question about his Echo results. Please f/u with the pt  Thanks

## 2015-08-01 ENCOUNTER — Ambulatory Visit (HOSPITAL_COMMUNITY)
Admission: RE | Admit: 2015-08-01 | Discharge: 2015-08-01 | Disposition: A | Discharge status: Home / Self Care | Payer: Medicare PPO | Source: Ambulatory Visit | Attending: Cardiology | Admitting: Cardiology

## 2015-08-01 ENCOUNTER — Encounter (HOSPITAL_COMMUNITY): Payer: Self-pay

## 2015-08-01 VITALS — BP 128/86 | HR 71 | Ht 72.0 in | Wt 241.8 lb

## 2015-08-01 DIAGNOSIS — Z7982 Long term (current) use of aspirin: Secondary | ICD-10-CM | POA: Diagnosis not present

## 2015-08-01 DIAGNOSIS — E1122 Type 2 diabetes mellitus with diabetic chronic kidney disease: Secondary | ICD-10-CM | POA: Insufficient documentation

## 2015-08-01 DIAGNOSIS — N189 Chronic kidney disease, unspecified: Secondary | ICD-10-CM | POA: Insufficient documentation

## 2015-08-01 DIAGNOSIS — Z8249 Family history of ischemic heart disease and other diseases of the circulatory system: Secondary | ICD-10-CM | POA: Diagnosis not present

## 2015-08-01 DIAGNOSIS — I2510 Atherosclerotic heart disease of native coronary artery without angina pectoris: Secondary | ICD-10-CM | POA: Insufficient documentation

## 2015-08-01 DIAGNOSIS — E785 Hyperlipidemia, unspecified: Secondary | ICD-10-CM | POA: Diagnosis not present

## 2015-08-01 DIAGNOSIS — N183 Chronic kidney disease, stage 3 unspecified: Secondary | ICD-10-CM

## 2015-08-01 DIAGNOSIS — I129 Hypertensive chronic kidney disease with stage 1 through stage 4 chronic kidney disease, or unspecified chronic kidney disease: Secondary | ICD-10-CM | POA: Diagnosis not present

## 2015-08-01 DIAGNOSIS — I255 Ischemic cardiomyopathy: Secondary | ICD-10-CM | POA: Insufficient documentation

## 2015-08-01 DIAGNOSIS — Z79899 Other long term (current) drug therapy: Secondary | ICD-10-CM | POA: Insufficient documentation

## 2015-08-01 DIAGNOSIS — Z7984 Long term (current) use of oral hypoglycemic drugs: Secondary | ICD-10-CM | POA: Diagnosis not present

## 2015-08-01 DIAGNOSIS — I5022 Chronic systolic (congestive) heart failure: Secondary | ICD-10-CM | POA: Diagnosis not present

## 2015-08-01 DIAGNOSIS — Z823 Family history of stroke: Secondary | ICD-10-CM | POA: Insufficient documentation

## 2015-08-01 DIAGNOSIS — Z951 Presence of aortocoronary bypass graft: Secondary | ICD-10-CM

## 2015-08-01 DIAGNOSIS — M109 Gout, unspecified: Secondary | ICD-10-CM | POA: Insufficient documentation

## 2015-08-01 LAB — LIPID PANEL
CHOLESTEROL: 164 mg/dL (ref 0–200)
HDL: 65 mg/dL (ref >40)
LDL CALC: 85 mg/dL (ref 0–99)
TRIGLYCERIDES: 72 mg/dL (ref <150)
Total CHOL/HDL Ratio: 2.5 RATIO
VLDL: 14 mg/dL (ref 0–40)

## 2015-08-01 LAB — BASIC METABOLIC PANEL
ANION GAP: 10 (ref 5–15)
BUN: 37 mg/dL — ABNORMAL HIGH (ref 6–20)
CALCIUM: 8.6 mg/dL — AB (ref 8.9–10.3)
CO2: 23 mmol/L (ref 22–32)
CREATININE: 1.49 mg/dL — AB (ref 0.61–1.24)
Chloride: 100 mmol/L — ABNORMAL LOW (ref 101–111)
GFR, EST NON AFRICAN AMERICAN: 52 mL/min — AB (ref >60)
Glucose, Bld: 238 mg/dL — ABNORMAL HIGH (ref 65–99)
Potassium: 3.7 mmol/L (ref 3.5–5.1)
Sodium: 133 mmol/L — ABNORMAL LOW (ref 135–145)

## 2015-08-01 LAB — BRAIN NATRIURETIC PEPTIDE: B Natriuretic Peptide: 657.6 pg/mL — ABNORMAL HIGH (ref 0.0–100.0)

## 2015-08-01 MED ORDER — ISOSORB DINITRATE-HYDRALAZINE 20-37.5 MG PO TABS: 1.0000 | ORAL_TABLET | Freq: Three times a day (TID) | ORAL | Status: DC

## 2015-08-01 NOTE — Progress Notes (Signed)
Patient ID: James [name], male   DOB: 1961-05-19, 54 y.o.   MRN: [id number] PCP: Dr. Michel Santee Rheumatology: Dr. Kellie Simmering Cardiology: Dr. Antoine Poche HF Cardiology: Dr. Shirlee Latch  54 yo with history of gout, DM2, HTN, CKD, CAD s/p CABG, and chronic systolic CHF presents for CHF clinic evaluation.  He had CABG x 6 in 2/12.  He has a long-standing ischemic cardiomyopathy, most recent echo in 10/16 with EF 20-25%.  He has actually been doing fairly well symptomatically.  Main compliant has been gout.  He is now on Uloric.  He walks daily for > 1/2 mile without dyspnea.  He can climb steps without problems.  Dyspnea only with heavy exertion.  No chest pain, no edema, no orthopnea/PND.  He tries to watch the sodium in his diet.    ECG: NSR, RBBB, old ASMI  Labs (8/16): HCT 38.9, K 4.9, creatinine 1.81  PMH: 1. Tophaceous gout 2. Hyperlipidemia 3. CKD 4. Type II diabetes 5. CAD: s/p CABG 2/12 with sequential LIMA-mid and distal LAD, sequential SVG to OM1/2/3, SVG-dRCA.   6. Chronic systolic CHF: Ischemic cardiomyopathy. Echo 2013 with EF 25%.  Echo (10/16) with EF 15-20%, restrictive diastolic function, moderately dilated RV with flattened interventricular septum, mild MR.    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    FH: HTN.  Father with CVA.   ROS: All systems reviewed and negative except as per HPI.   Current Outpatient Prescriptions  Medication Sig Dispense Refill  . aspirin 81 MG tablet Take 81 mg by mouth daily.    Marland Kitchen atorvastatin (LIPITOR) 80 MG tablet TAKE ONE TABLET BY MOUTH DAILY 90 tablet 3  . carvedilol (COREG) 25 MG tablet Take 2 tablets twice a day with meals 120 tablet 1  . colchicine 0.6 MG tablet Take 0.6 mg by mouth daily as needed.     Marland Kitchen lisinopril (PRINIVIL,ZESTRIL) 20 MG tablet TAKE 1/2 TABLET TWICE DAILY 60 tablet 1  . Misc Natural Products (TART CHERRY ADVANCED PO) Take 1 tablet by mouth daily as needed.     . nitroGLYCERIN (NITROSTAT) 0.4 MG SL tablet  Place 1 tablet (0.4 mg total) under the tongue every 5 (five) minutes as needed for chest pain. 25 tablet 2  . torsemide (DEMADEX) 20 MG tablet TAKE 1 TABLET EVERY DAY 90 tablet 0  . ULORIC 80 MG TABS Take 1 tablet by mouth daily.  2  . glipiZIDE (GLUCOTROL) 5 MG tablet Take 1 tablet (5 mg total) by mouth 2 (two) times daily before a meal. 180 tablet 3  . isosorbide-hydrALAZINE (BIDIL) 20-37.5 MG tablet Take 1 tablet by mouth 3 (three) times daily. 90 tablet 3   No current facility-administered medications for this encounter.   BP 128/86 mmHg  Pulse 71  Ht 6' (1.829 m)  Wt 241 lb 12.8 oz (109.68 kg)  BMI 32.79 kg/m2  SpO2 100% General: NAD Neck: No JVD, no thyromegaly or thyroid nodule.  Lungs: Clear to auscultation bilaterally with normal respiratory effort. CV: Nondisplaced PMI.  Heart regular S1/S2, no S3/S4, no murmur.  Trace ankle edema.  No carotid bruit.  Normal pedal pulses.  Abdomen: Soft, nontender, no hepatosplenomegaly, no distention.  Skin: Intact without lesions or rashes. Prominent tophi noted on fingers and right calf.  Neurologic: Alert and oriented x 3.  Psych: Normal affect. Extremities: No clubbing or cyanosis.  HEENT: Normal.   Assessment/Plan: 1. Chronic systolic CHF: Ischemic cardiomyopathy.  EF 25% in 2013, EF 20-25% on 10/16 echo with  evidence for RV volume overload.  He has been stable over several years but LV systolic function has not improved.  NYHA class II symptoms by his report.  He is not volume overloaded on exam.  He currently seems compensated but with a significant risk for clinical worsening over the next few years.   - I will arrange for CPX to get an objective idea of his functional capacity.  - Continue current Coreg.  - Add Bidil 1 tab tid.  - Continue lisinopril 10 mg bid, will eventually try to transition over to [employer].  BMET/BNP today.  - If K/creatinine remain stable, will try to get him on spironolactone in the future.  - Can continue  the current dose of torsemide.  - RBBB so not good CRT candidate.  - Apparently there were some issues with his insurance regarding ICD in the past.  I think we should be able to get this approved.  Will refer to EP for ICD, prefer [employer] or [employer].   2. Gout: Significant tophaceous gout.  On Uloric.   3. CKD: May be related to HTN, diabetes, hyperuricemia.  Creatinine most recently was 1.8.  Will check BMET today.  4. CAD: s/p CABG.  No chest pain.  Continue ASA 81 and atorvastatin.  Will draw lipids (ordered last visit but do not appear to have been drawn).   Followup in 1 month.   Marca Ancona 08/01/2015

## 2015-08-01 NOTE — Patient Instructions (Signed)
Labs today will call if abnormal   Start Bidil 20/37.5mg  three times a day  Will refer to EP at [facility] office  Will schedule Cardiopulmonary exercise testing within a month  Follow up in 1 month

## 2015-08-02 ENCOUNTER — Encounter (HOSPITAL_COMMUNITY): Payer: Medicare PPO

## 2015-08-17 NOTE — Progress Notes (Signed)
Electrophysiology Office Note   Date:  08/17/2015   ID:  James [name], DOB Mar 08, 1961, MRN [id number]  PCP:  Joaquin CourtsFAVERO,JOHN PATRICK, DO  Cardiologist:  Rollene RotundaJames Hochrein, Marca Anconaalton McLean Primary Electrophysiologist: Regan LemmingWill Martin Ronald Londo, MD    No chief complaint on file.    History of Present Illness: James [name] is a 54 y.o. male who presents today for electrophysiology evaluation.   He has a history of ischemic cardiomyopathy, HTN, DM, and renal insufficiency.  He has an EF of 15-20%.  He has been on optimal medical therapy since at least 2014.  He is able to exercise regularly.  He says that he walks around a track 2-3 laps almost daily.    Today, he denies symptoms of palpitations, chest pain, shortness of breath, orthopnea, PND, lower extremity edema, claudication, dizziness, presyncope, syncope, bleeding, or neurologic sequela. The patient is tolerating medications without difficulties and is otherwise without complaint today.    Past Medical History  Diagnosis Date  . Hypertension   . Diabetes mellitus   . Gout   . Chronic edema   . Obesity   . Coronary artery disease   . Ischemic cardiomyopathy     EF 25%  . CMI (chronic mesenteric ischemia) [facility](HCC)    Past Surgical History  Procedure Laterality Date  . Coronary artery bypass graft  2/12    Sequential left internal mammary graft to the mid and distal LAD, sequential sapenous vein graft to the first, second, and third obutuse marginal branches  of the  left circumflex  coronary artery.  Saphenous vein graft to the distal right coronary artery.     Current Outpatient Prescriptions  Medication Sig Dispense Refill  . aspirin 81 MG tablet Take 81 mg by mouth daily.    Marland Kitchen. atorvastatin (LIPITOR) 80 MG tablet TAKE ONE TABLET BY MOUTH DAILY 90 tablet 3  . carvedilol (COREG) 25 MG tablet Take 2 tablets twice a day with meals 120 tablet 1  . colchicine 0.6 MG tablet Take 0.6 mg by mouth daily as needed.     Marland Kitchen. glipiZIDE (GLUCOTROL) 5  MG tablet Take 1 tablet (5 mg total) by mouth 2 (two) times daily before a meal. 180 tablet 3  . isosorbide-hydrALAZINE (BIDIL) 20-37.5 MG tablet Take 1 tablet by mouth 3 (three) times daily. 90 tablet 3  . lisinopril (PRINIVIL,ZESTRIL) 20 MG tablet TAKE 1/2 TABLET TWICE DAILY 60 tablet 1  . Misc Natural Products (TART CHERRY ADVANCED PO) Take 1 tablet by mouth daily as needed.     . nitroGLYCERIN (NITROSTAT) 0.4 MG SL tablet Place 1 tablet (0.4 mg total) under the tongue every 5 (five) minutes as needed for chest pain. 25 tablet 2  . torsemide (DEMADEX) 20 MG tablet TAKE 1 TABLET EVERY DAY 90 tablet 0  . ULORIC 80 MG TABS Take 1 tablet by mouth daily.  2   No current facility-administered medications for this visit.    Allergies:   Review of patient's allergies indicates no known allergies.   Social History:  The patient  reports that he has never smoked. He has never used smokeless tobacco. He reports that he drinks alcohol. He reports that he does not use illicit drugs.   Family History:  The patient's family history includes Diabetes in his other; Hypertension in his other.    ROS:  Please see the history of present illness.  All other systems are reviewed and negative.    PHYSICAL EXAM: VS:  There were no  vitals taken for this visit. , BMI There is no weight on file to calculate BMI. GEN: Well nourished, well developed, in no acute distress HEENT: normal Neck: no JVD, carotid bruits, or masses Cardiac: RRR; no murmurs, rubs, or gallops,no edema  Respiratory:  clear to auscultation bilaterally, normal work of breathing GI: soft, nontender, nondistended, + BS MS: no deformity or atrophy Skin: warm and dry Neuro:  Strength and sensation are intact Psych: euthymic mood, full affect  EKG:  EKG is ordered today. The ekg ordered today shows sinus rhythm, RBBB  Recent Labs: 08/01/2015: B Natriuretic Peptide 657.6*; BUN 37*; Creatinine, Ser 1.49*; Potassium 3.7; Sodium 133*     Lipid Panel     Component Value Date/Time   CHOL 164 08/01/2015 1045   TRIG 72 08/01/2015 1045   HDL 65 08/01/2015 1045   CHOLHDL 2.5 08/01/2015 1045   VLDL 14 08/01/2015 1045   LDLCALC 85 08/01/2015 1045     Wt Readings from Last 3 Encounters:  08/01/15 241 lb 12.8 oz (109.68 kg)  07/27/15 242 lb (109.77 kg)  01/16/15 234 lb 8 oz (106.369 kg)      Other studies Reviewed: Additional studies/ records that were reviewed today include: TTE 07/2015  Review of the above records today demonstrates:  - Left ventricle: The cavity size was severely dilated. Systolic function was vigorous. The estimated ejection fraction was in the range of 15% to 20%. Wall motion was normal; there were no regional wall motion abnormalities. Doppler parameters are consistent with a reversible restrictive pattern, indicative of decreased left ventricular diastolic compliance and/or increased left atrial pressure (grade 3 diastolic dysfunction). - Ventricular septum: The contour showed diastolic flattening and systolic flattening consistent with RV pressure overload. - Mitral valve: Transvalvular velocity was within the normal range. There was no evidence for stenosis. There was mild regurgitation. - Left atrium: The atrium was moderately to severely dilated. - Right ventricle: The cavity size was moderately dilated. Wall thickness was normal. - Pulmonic valve: There was trivial regurgitation.   ASSESSMENT AND PLAN:  1.  Chronic systolic congestive heart failure: Has NYHA class I-II symptoms.  Has been on optimal therapy for >3 months.  He has a RBBB on ECG.  At this time, he does qualify for an ICD with either a IIa or IIb indication.  Discussed the risks and benefits of the procedure including bleeding, infection, tamponade and pneumothorax.  Porsha Skilton get a CBC and BMP and schedule as soon as he is able.    Current medicines are reviewed at length with the patient today.   The  patient does not have concerns regarding his medicines.  The following changes were made today:  none  Labs/ tests ordered today include: ICD  No orders of the defined types were placed in this encounter.     Disposition:   FU with Tychelle Purkey Post ICD  Signed, Dejuan Elman Jorja Loa, MD  08/17/2015 9:56 AM     [facility] [address] [place] [address] [phone] (office) [phone] (fax)

## 2015-08-18 ENCOUNTER — Ambulatory Visit (INDEPENDENT_AMBULATORY_CARE_PROVIDER_SITE_OTHER): Payer: Medicare PPO | Admitting: Cardiology

## 2015-08-18 ENCOUNTER — Encounter: Payer: Self-pay | Admitting: Cardiology

## 2015-08-18 VITALS — BP 120/74 | HR 74 | Ht 72.0 in | Wt 239.0 lb

## 2015-08-18 DIAGNOSIS — I5022 Chronic systolic (congestive) heart failure: Secondary | ICD-10-CM

## 2015-08-18 DIAGNOSIS — I255 Ischemic cardiomyopathy: Secondary | ICD-10-CM

## 2015-08-18 DIAGNOSIS — Z01812 Encounter for preprocedural laboratory examination: Secondary | ICD-10-CM

## 2015-08-18 NOTE — Patient Instructions (Signed)
Medication Instructions:  Your physician recommends that you continue on your current medications as directed. Please refer to the Current Medication list given to you today.  Labwork: None ordered today  Testing/Procedures: Your physician has recommended that you have a defibrillator inserted. An implantable cardioverter defibrillator (ICD) is a small device that is placed in your chest or, in rare cases, your abdomen. This device uses electrical pulses or shocks to help control life-threatening, irregular heartbeats that could lead the heart to suddenly stop beating (sudden cardiac arrest). Leads are attached to the ICD that goes into your heart. This is done in the hospital and usually requires an overnight stay.   Please call Sharonne Ricketts, RN when you are ready to schedule this procedure.  Available dates (these are subject to change) are: 11/15, 11/18, 11/21, 11/28, 12/01, 12/13, 12/16, 12/21, 12/23  Follow-Up: To be determined once you schedule the procedure    Any Other Special Instructions Will Be Listed Below (If Applicable).  If you need a refill on your cardiac medications before your next appointment, please call your pharmacy.  Thank you for choosing [facility]!!   Dory HornSherri Roann Merk, RN [phone]

## 2015-08-21 NOTE — Progress Notes (Signed)
OV faxed to PCP. 

## 2015-08-25 NOTE — Addendum Note (Signed)
Addended by: Reesa ChewJONES, Silas Muff G on: 08/25/2015 02:48 PM   Modules accepted: Orders

## 2015-09-04 ENCOUNTER — Other Ambulatory Visit (HOSPITAL_COMMUNITY): Payer: Self-pay | Admitting: *Deleted

## 2015-09-04 ENCOUNTER — Telehealth: Payer: Self-pay | Admitting: Cardiology

## 2015-09-04 ENCOUNTER — Ambulatory Visit (HOSPITAL_COMMUNITY): Payer: Medicare PPO | Attending: Cardiology

## 2015-09-04 DIAGNOSIS — R0600 Dyspnea, unspecified: Secondary | ICD-10-CM | POA: Diagnosis not present

## 2015-09-04 DIAGNOSIS — I5022 Chronic systolic (congestive) heart failure: Secondary | ICD-10-CM | POA: Insufficient documentation

## 2015-09-04 NOTE — Telephone Encounter (Signed)
Calling with some dates for his procedure.  Advised that Hope BuddsSherri Price,RN was out of office today but will forward to her.  States his first choice would be 12/16 and second choice 12/21.

## 2015-09-04 NOTE — Telephone Encounter (Signed)
New Message     Pt calling to give Sherri two dates for procedure, pt's first choice is 10/06/15 and his second choice is 10/11/15. Please call back and advise.

## 2015-09-12 ENCOUNTER — Other Ambulatory Visit: Payer: Self-pay | Admitting: Cardiology

## 2015-09-19 ENCOUNTER — Encounter: Payer: Self-pay | Admitting: Cardiology

## 2015-09-19 ENCOUNTER — Encounter: Payer: Self-pay | Admitting: *Deleted

## 2015-09-19 NOTE — Telephone Encounter (Signed)
Previously scheduled ICD for 10/06/15. Pre procedure labs to be drawn in hospital prior to procedure (lives in [place] Wound check on 12/29. 3 month f/u w/ Camnitz, post implant, scheduled for 01/09/16. Letter of instructions reviewed with patient and mailed to home address. Patient verbalized understanding and agreeable to plan.

## 2015-09-19 NOTE — Telephone Encounter (Signed)
This encounter was created in error - please disregard.

## 2015-09-19 NOTE — Telephone Encounter (Signed)
Call Documentation      Lincoln MaxinKedra Sumner at 09/19/2015 4:36 PM     Status: Signed       Expand All Collapse All   New Message    Pt calling to return the call for RN

## 2015-09-19 NOTE — Telephone Encounter (Signed)
New Message    Pt calling to return the call for RN

## 2015-09-21 DIAGNOSIS — Z95810 Presence of automatic (implantable) cardiac defibrillator: Secondary | ICD-10-CM | POA: Insufficient documentation

## 2015-09-21 HISTORY — DX: Presence of automatic (implantable) cardiac defibrillator: Z95.810

## 2015-10-02 ENCOUNTER — Ambulatory Visit (HOSPITAL_COMMUNITY)
Admission: RE | Admit: 2015-10-02 | Discharge: 2015-10-02 | Disposition: A | Discharge status: Home / Self Care | Payer: Medicare PPO | Source: Ambulatory Visit | Attending: Cardiology | Admitting: Cardiology

## 2015-10-02 ENCOUNTER — Encounter (HOSPITAL_COMMUNITY): Payer: Self-pay

## 2015-10-02 VITALS — BP 112/70 | HR 69 | Wt 247.0 lb

## 2015-10-02 DIAGNOSIS — Z823 Family history of stroke: Secondary | ICD-10-CM | POA: Insufficient documentation

## 2015-10-02 DIAGNOSIS — Z951 Presence of aortocoronary bypass graft: Secondary | ICD-10-CM | POA: Diagnosis not present

## 2015-10-02 DIAGNOSIS — I4510 Unspecified right bundle-branch block: Secondary | ICD-10-CM | POA: Diagnosis not present

## 2015-10-02 DIAGNOSIS — E119 Type 2 diabetes mellitus without complications: Secondary | ICD-10-CM | POA: Diagnosis not present

## 2015-10-02 DIAGNOSIS — E785 Hyperlipidemia, unspecified: Secondary | ICD-10-CM | POA: Diagnosis not present

## 2015-10-02 DIAGNOSIS — I255 Ischemic cardiomyopathy: Secondary | ICD-10-CM | POA: Insufficient documentation

## 2015-10-02 DIAGNOSIS — M1A9XX1 Chronic gout, unspecified, with tophus (tophi): Secondary | ICD-10-CM | POA: Insufficient documentation

## 2015-10-02 DIAGNOSIS — N183 Chronic kidney disease, stage 3 unspecified: Secondary | ICD-10-CM

## 2015-10-02 DIAGNOSIS — Z7982 Long term (current) use of aspirin: Secondary | ICD-10-CM | POA: Insufficient documentation

## 2015-10-02 DIAGNOSIS — Z8249 Family history of ischemic heart disease and other diseases of the circulatory system: Secondary | ICD-10-CM | POA: Diagnosis not present

## 2015-10-02 DIAGNOSIS — Z7984 Long term (current) use of oral hypoglycemic drugs: Secondary | ICD-10-CM | POA: Diagnosis not present

## 2015-10-02 DIAGNOSIS — N189 Chronic kidney disease, unspecified: Secondary | ICD-10-CM | POA: Diagnosis not present

## 2015-10-02 DIAGNOSIS — I5022 Chronic systolic (congestive) heart failure: Secondary | ICD-10-CM | POA: Insufficient documentation

## 2015-10-02 DIAGNOSIS — Z79899 Other long term (current) drug therapy: Secondary | ICD-10-CM | POA: Diagnosis not present

## 2015-10-02 DIAGNOSIS — I2510 Atherosclerotic heart disease of native coronary artery without angina pectoris: Secondary | ICD-10-CM | POA: Insufficient documentation

## 2015-10-02 DIAGNOSIS — I110 Hypertensive heart disease with heart failure: Secondary | ICD-10-CM | POA: Insufficient documentation

## 2015-10-02 MED ORDER — SACUBITRIL-VALSARTAN 24-26 MG PO TABS: 1.0000 | ORAL_TABLET | Freq: Two times a day (BID) | ORAL | Status: DC

## 2015-10-02 NOTE — Progress Notes (Signed)
Patient ID: James [name], male   DOB: Sep 23, 1961, 54 y.o.   MRN: [id number] PCP: Dr. Michel Santee Rheumatology: Dr. Kellie Simmering Cardiology: Dr. Antoine Poche HF Cardiology: Dr. Shirlee Latch  54 yo with history of gout, DM2, HTN, CKD, CAD s/p CABG, and chronic systolic CHF presents for CHF clinic evaluation.  He had CABG x 6 in 2/12.  He has a long-standing ischemic cardiomyopathy, most recent echo in 10/16 with EF 20-25%.  Main compliant has been gout.  He is now on Uloric.    Today he returns HF follow up. Overall feeling ok. Walks 1/2 mile slowly. Denies SOB/PND/Orthopnea.   No chest pain. Weight at home 238-240 pounds.  Weight is up 6 lbs on our scales since last appointment. Taking all medications. Following low salt diet.  Lives at home with his wife. Disabled since 2012.   Labs (8/16): HCT 38.9, K 4.9, creatinine 1.81 Labs (08/01/2015) : K 3.7 Creatinine 1.49, BNP 658, LDL 85, HDL 65 Labs (09/18/2015): K 5.1 Creatinine 1.5  PMH: 1. Tophaceous gout 2. Hyperlipidemia 3. CKD 4. Type II diabetes 5. CAD: s/p CABG 2/12 with sequential LIMA-mid and distal LAD, sequential SVG to OM1/2/3, SVG-dRCA.   6. Chronic systolic CHF: Ischemic cardiomyopathy. Echo 2013 with EF 25%.  Echo (10/16) with EF 15-20%, restrictive diastolic function, moderately dilated RV with flattened interventricular septum, mild MR.  CPX (10/16) with peak VO2 11.5, VE/VCO2 slope 33.6, RER 1.19 => moderate to severe HF limitation.   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    FH: HTN.  Father with CVA.   ROS: All systems reviewed and negative except as per HPI.   Current Outpatient Prescriptions  Medication Sig Dispense Refill  . aspirin 81 MG tablet Take 81 mg by mouth daily.    Marland Kitchen atorvastatin (LIPITOR) 80 MG tablet TAKE ONE TABLET BY MOUTH DAILY 90 tablet 3  . carvedilol (COREG) 25 MG tablet Take 2 tablets twice a day with meals 120 tablet 1  . glipiZIDE (GLUCOTROL) 5 MG tablet Take 1 tablet (5 mg total) by mouth 2  (two) times daily before a meal. 180 tablet 3  . isosorbide-hydrALAZINE (BIDIL) 20-37.5 MG tablet Take 1 tablet by mouth 3 (three) times daily. 90 tablet 3  . lisinopril (PRINIVIL,ZESTRIL) 20 MG tablet TAKE 1/2 TABLET TWICE DAILY 60 tablet 1  . Misc Natural Products (TART CHERRY ADVANCED PO) Take 1 tablet by mouth daily as needed.     . torsemide (DEMADEX) 20 MG tablet TAKE 1 TABLET BY MOUTH DAILY. 90 tablet 2  . ULORIC 80 MG TABS Take 1 tablet by mouth daily.  2  . colchicine 0.6 MG tablet Take 0.6 mg by mouth daily as needed (gout).     . nitroGLYCERIN (NITROSTAT) 0.4 MG SL tablet Place 1 tablet (0.4 mg total) under the tongue every 5 (five) minutes as needed for chest pain. (Patient not taking: Reported on 10/02/2015) 25 tablet 2   No current facility-administered medications for this encounter.   BP 112/70 mmHg  Pulse 69  Wt 247 lb (112.038 kg)  SpO2 100% General: NAD Neck: JVP 8 cm, no thyromegaly or thyroid nodule.  Lungs: Clear to auscultation bilaterally with normal respiratory effort. CV: Nondisplaced PMI.  Heart regular S1/S2, no S3/S4, no murmur.  1+ ankle edema bilaterally.  No carotid bruit.  Normal pedal pulses.  Abdomen: Soft, nontender, no hepatosplenomegaly, no distention.  Skin: Intact without lesions or rashes. Prominent tophi noted on fingers and right calf.  Neurologic: Alert and  oriented x 3.  Psych: Normal affect. Extremities: No clubbing or cyanosis. R and LLE   HEENT: Normal.   Assessment/Plan: 1. Chronic systolic CHF: Ischemic cardiomyopathy.  EF 25% in 2013, EF 20-25% on 10/16 echo with evidence for RV volume overload.  He has been stable over several years but LV systolic function has not improved.  NYHA class II symptoms by his report. CPX suggests a more significant functional limitation than he reports subjectively. He currently seems compensated but with a significant risk for clinical worsening over the next few years.   - Continue current Coreg 50 mg  twice a day .  - Continue Bidil 20-37.5 mg tid.   - Stop lisinopril and allow 36 hour wash out. Start entresto 24-26 mg twice a day.  BMET in 2 wks.  - If K/creatinine remain stable, will try to get him on spironolactone in the future.  - Can continue the current dose of torsemide.  - RBBB so not good CRT candidate.  - Plan for ICD on Friday, prefer Medtronic or St Jude.   2. Gout: Significant tophaceous gout.  On Uloric.   3. CKD: May be related to HTN, diabetes, hyperuricemia.  Creatinine most recently was 1.5.  4. CAD: s/p CABG.  No chest pain.  Continue ASA 81 and atorvastatin.  Lipids acceptable in 10/16.   Follow up in  2 months.   Amy Clegg 10/02/2015   Patient seen with NP, agree with the above note.  CPX showed a moderate to severe functional limitation, which seems more marked than he reports symptomatically.  He appears to be very mildly volume overloaded on exam.  - Plan for ICD later this week. - [facility], this will allow some additional diuresis. - Will need to follow closely over time.   Marca AnconaDalton Mickell Birdwell 10/02/2015

## 2015-10-02 NOTE — Patient Instructions (Signed)
STOP Lisinopril.  START Entresto 24-26mg  36 hours after last dose of lisinopril.  Labs: 2 weeks (bmet)  FOLLOW UP: 2months Dr.McLean  Do the following things EVERYDAY: 1) Weigh yourself in the morning before breakfast. Write it down and keep it in a log. 2) Take your medicines as prescribed 3) Eat low salt foods-Limit salt (sodium) to 2000 mg per day.  4) Stay as active as you can everyday 5) Limit all fluids for the day to less than 2 liters

## 2015-10-05 ENCOUNTER — Telehealth: Payer: Self-pay | Admitting: Cardiology

## 2015-10-05 NOTE — Telephone Encounter (Signed)
New message      Pt has a question about his procedure scheduled for tomorrow

## 2015-10-05 NOTE — Telephone Encounter (Signed)
Reviewed questions patient had about tomorrow's procedure. States he is still nervous for procedure. Thanks me for taking time to answer questions. He understands to arrive at 9:30 a.m.

## 2015-10-06 ENCOUNTER — Encounter (HOSPITAL_COMMUNITY): Payer: Self-pay | Admitting: Cardiology

## 2015-10-06 ENCOUNTER — Ambulatory Visit (HOSPITAL_COMMUNITY)
Admission: RE | Admit: 2015-10-06 | Discharge: 2015-10-07 | Disposition: A | Discharge status: Home / Self Care | Payer: Medicare PPO | Source: Ambulatory Visit | Attending: Cardiology | Admitting: Cardiology

## 2015-10-06 ENCOUNTER — Other Ambulatory Visit: Payer: Self-pay | Admitting: Nurse Practitioner

## 2015-10-06 ENCOUNTER — Encounter (HOSPITAL_COMMUNITY)
Admission: RE | Disposition: A | Discharge status: Home / Self Care | Payer: Medicare PPO | Source: Ambulatory Visit | Attending: Cardiology

## 2015-10-06 ENCOUNTER — Telehealth (HOSPITAL_COMMUNITY): Payer: Self-pay | Admitting: Pharmacist

## 2015-10-06 DIAGNOSIS — I5022 Chronic systolic (congestive) heart failure: Secondary | ICD-10-CM | POA: Insufficient documentation

## 2015-10-06 DIAGNOSIS — K551 Chronic vascular disorders of intestine: Secondary | ICD-10-CM | POA: Insufficient documentation

## 2015-10-06 DIAGNOSIS — Z95810 Presence of automatic (implantable) cardiac defibrillator: Secondary | ICD-10-CM

## 2015-10-06 DIAGNOSIS — Z79899 Other long term (current) drug therapy: Secondary | ICD-10-CM | POA: Diagnosis not present

## 2015-10-06 DIAGNOSIS — N289 Disorder of kidney and ureter, unspecified: Secondary | ICD-10-CM | POA: Diagnosis not present

## 2015-10-06 DIAGNOSIS — M109 Gout, unspecified: Secondary | ICD-10-CM | POA: Insufficient documentation

## 2015-10-06 DIAGNOSIS — I255 Ischemic cardiomyopathy: Secondary | ICD-10-CM | POA: Insufficient documentation

## 2015-10-06 DIAGNOSIS — I509 Heart failure, unspecified: Secondary | ICD-10-CM

## 2015-10-06 DIAGNOSIS — Z6833 Body mass index (BMI) 33.0-33.9, adult: Secondary | ICD-10-CM | POA: Diagnosis not present

## 2015-10-06 DIAGNOSIS — Z951 Presence of aortocoronary bypass graft: Secondary | ICD-10-CM | POA: Insufficient documentation

## 2015-10-06 DIAGNOSIS — I110 Hypertensive heart disease with heart failure: Secondary | ICD-10-CM | POA: Diagnosis not present

## 2015-10-06 DIAGNOSIS — I429 Cardiomyopathy, unspecified: Secondary | ICD-10-CM

## 2015-10-06 DIAGNOSIS — Z01812 Encounter for preprocedural laboratory examination: Secondary | ICD-10-CM

## 2015-10-06 DIAGNOSIS — Z7984 Long term (current) use of oral hypoglycemic drugs: Secondary | ICD-10-CM | POA: Diagnosis not present

## 2015-10-06 DIAGNOSIS — I4510 Unspecified right bundle-branch block: Secondary | ICD-10-CM | POA: Insufficient documentation

## 2015-10-06 DIAGNOSIS — E119 Type 2 diabetes mellitus without complications: Secondary | ICD-10-CM | POA: Diagnosis not present

## 2015-10-06 DIAGNOSIS — I2510 Atherosclerotic heart disease of native coronary artery without angina pectoris: Secondary | ICD-10-CM | POA: Insufficient documentation

## 2015-10-06 DIAGNOSIS — E669 Obesity, unspecified: Secondary | ICD-10-CM | POA: Diagnosis not present

## 2015-10-06 DIAGNOSIS — Z7982 Long term (current) use of aspirin: Secondary | ICD-10-CM | POA: Insufficient documentation

## 2015-10-06 DIAGNOSIS — Z95818 Presence of other cardiac implants and grafts: Secondary | ICD-10-CM

## 2015-10-06 HISTORY — DX: Presence of automatic (implantable) cardiac defibrillator: Z95.810

## 2015-10-06 HISTORY — DX: Acute myocardial infarction, unspecified: I21.9

## 2015-10-06 HISTORY — PX: EP IMPLANTABLE DEVICE: SHX172B

## 2015-10-06 HISTORY — DX: Gout, unspecified: M10.9

## 2015-10-06 HISTORY — DX: Type 2 diabetes mellitus without complications: E11.9

## 2015-10-06 LAB — CBC
HCT: 40.3 % (ref 39.0–52.0)
Hemoglobin: 13.2 g/dL (ref 13.0–17.0)
MCH: 30.6 pg (ref 26.0–34.0)
MCHC: 32.8 g/dL (ref 30.0–36.0)
MCV: 93.5 fL (ref 78.0–100.0)
Platelets: 207 10*3/uL (ref 150–400)
RBC: 4.31 MIL/uL (ref 4.22–5.81)
RDW: 13.5 % (ref 11.5–15.5)
WBC: 10.4 10*3/uL (ref 4.0–10.5)

## 2015-10-06 LAB — BASIC METABOLIC PANEL
Anion gap: 11 (ref 5–15)
BUN: 42 mg/dL — AB (ref 6–20)
CALCIUM: 8.6 mg/dL — AB (ref 8.9–10.3)
CHLORIDE: 104 mmol/L (ref 101–111)
CO2: 21 mmol/L — ABNORMAL LOW (ref 22–32)
CREATININE: 1.95 mg/dL — AB (ref 0.61–1.24)
GFR, EST AFRICAN AMERICAN: 44 mL/min — AB (ref >60)
GFR, EST NON AFRICAN AMERICAN: 38 mL/min — AB (ref >60)
Glucose, Bld: 178 mg/dL — ABNORMAL HIGH (ref 65–99)
Potassium: 4.3 mmol/L (ref 3.5–5.1)
SODIUM: 136 mmol/L (ref 135–145)

## 2015-10-06 LAB — GLUCOSE, CAPILLARY
GLUCOSE-CAPILLARY: 182 mg/dL — AB (ref 65–99)
Glucose-Capillary: 122 mg/dL — ABNORMAL HIGH (ref 65–99)
Glucose-Capillary: 201 mg/dL — ABNORMAL HIGH (ref 65–99)
Glucose-Capillary: 236 mg/dL — ABNORMAL HIGH (ref 65–99)

## 2015-10-06 LAB — SURGICAL PCR SCREEN
MRSA, PCR: NEGATIVE
STAPHYLOCOCCUS AUREUS: NEGATIVE

## 2015-10-06 SURGERY — ICD IMPLANT
Anesthesia: LOCAL

## 2015-10-06 MED ORDER — CARVEDILOL 12.5 MG PO TABS
50.0000 mg | ORAL_TABLET | Freq: Two times a day (BID) | ORAL | Status: DC
  Administered 2015-10-06 – 2015-10-07 (×2): 50 mg via ORAL
  Filled 2015-10-06 (×2): qty 4

## 2015-10-06 MED ORDER — ACETAMINOPHEN 325 MG PO TABS
325.0000 mg | ORAL_TABLET | ORAL | Status: DC | PRN
  Administered 2015-10-06: 650 mg via ORAL
  Administered 2015-10-07: 325 mg via ORAL
  Filled 2015-10-06: qty 2
  Filled 2015-10-06: qty 1

## 2015-10-06 MED ORDER — SODIUM CHLORIDE 0.9 % IR SOLN
Status: DC | PRN
  Administered 2015-10-06: 500 mL

## 2015-10-06 MED ORDER — FENTANYL CITRATE (PF) 100 MCG/2ML IJ SOLN
INTRAMUSCULAR | Status: AC
  Filled 2015-10-06: qty 2

## 2015-10-06 MED ORDER — YOU HAVE A PACEMAKER BOOK
Freq: Once | Status: AC
  Administered 2015-10-06
  Filled 2015-10-06: qty 1

## 2015-10-06 MED ORDER — GENTAMICIN SULFATE 40 MG/ML IJ SOLN
INTRAMUSCULAR | Status: AC
  Filled 2015-10-06: qty 2

## 2015-10-06 MED ORDER — ACETAMINOPHEN 325 MG PO TABS: 325.0000 mg | ORAL_TABLET | ORAL | Status: DC | PRN

## 2015-10-06 MED ORDER — CEFAZOLIN SODIUM-DEXTROSE 2-3 GM-% IV SOLR
INTRAVENOUS | Status: DC | PRN
Start: 2015-10-06 — End: 2015-10-06
  Administered 2015-10-06: 2 g via INTRAVENOUS

## 2015-10-06 MED ORDER — LIDOCAINE HCL (CARDIAC) 20 MG/ML IV SOLN
INTRAVENOUS | Status: AC
  Filled 2015-10-06: qty 5

## 2015-10-06 MED ORDER — SODIUM CHLORIDE 0.9 % IV SOLN
INTRAVENOUS | Status: DC
  Administered 2015-10-06: 11:00:00 via INTRAVENOUS

## 2015-10-06 MED ORDER — MIDAZOLAM HCL 5 MG/5ML IJ SOLN
INTRAMUSCULAR | Status: DC | PRN
  Administered 2015-10-06 (×2): 1 mg via INTRAVENOUS
  Administered 2015-10-06: 2 mg via INTRAVENOUS

## 2015-10-06 MED ORDER — ONDANSETRON HCL 4 MG/2ML IJ SOLN: 4.0000 mg | Freq: Four times a day (QID) | INTRAMUSCULAR | Status: DC | PRN

## 2015-10-06 MED ORDER — ISOSORB DINITRATE-HYDRALAZINE 20-37.5 MG PO TABS
1.0000 | ORAL_TABLET | Freq: Three times a day (TID) | ORAL | Status: DC
  Administered 2015-10-06 – 2015-10-07 (×3): 1 via ORAL
  Filled 2015-10-06 (×5): qty 1

## 2015-10-06 MED ORDER — GLIPIZIDE 5 MG PO TABS
5.0000 mg | ORAL_TABLET | Freq: Two times a day (BID) | ORAL | Status: DC
  Administered 2015-10-06 – 2015-10-07 (×2): 5 mg via ORAL
  Filled 2015-10-06 (×4): qty 1

## 2015-10-06 MED ORDER — SACUBITRIL-VALSARTAN 24-26 MG PO TABS
1.0000 | ORAL_TABLET | Freq: Two times a day (BID) | ORAL | Status: DC
  Administered 2015-10-06 – 2015-10-07 (×2): 1 via ORAL
  Filled 2015-10-06 (×3): qty 1

## 2015-10-06 MED ORDER — CEFAZOLIN SODIUM 1-5 GM-% IV SOLN: 1.0000 g | Freq: Four times a day (QID) | INTRAVENOUS | Status: DC

## 2015-10-06 MED ORDER — HEPARIN (PORCINE) IN NACL 2-0.9 UNIT/ML-% IJ SOLN
INTRAMUSCULAR | Status: AC
Start: 2015-10-06 — End: 2015-10-06
  Filled 2015-10-06: qty 1000

## 2015-10-06 MED ORDER — CEFAZOLIN SODIUM 1-5 GM-% IV SOLN
1.0000 g | Freq: Four times a day (QID) | INTRAVENOUS | Status: AC
  Administered 2015-10-06 – 2015-10-07 (×3): 1 g via INTRAVENOUS
  Filled 2015-10-06 (×3): qty 50

## 2015-10-06 MED ORDER — TORSEMIDE 20 MG PO TABS: 20.0000 mg | ORAL_TABLET | ORAL | Status: DC

## 2015-10-06 MED ORDER — MUPIROCIN 2 % EX OINT
TOPICAL_OINTMENT | Freq: Two times a day (BID) | CUTANEOUS | Status: DC
  Administered 2015-10-06: 11:00:00 via NASAL

## 2015-10-06 MED ORDER — NITROGLYCERIN 0.4 MG SL SUBL: 0.4000 mg | SUBLINGUAL_TABLET | SUBLINGUAL | Status: DC | PRN

## 2015-10-06 MED ORDER — FENTANYL CITRATE (PF) 100 MCG/2ML IJ SOLN
INTRAMUSCULAR | Status: DC | PRN
  Administered 2015-10-06 (×3): 25 ug via INTRAVENOUS

## 2015-10-06 MED ORDER — CEFAZOLIN SODIUM-DEXTROSE 2-3 GM-% IV SOLR
INTRAVENOUS | Status: AC
  Filled 2015-10-06: qty 50

## 2015-10-06 MED ORDER — ASPIRIN EC 81 MG PO TBEC
81.0000 mg | DELAYED_RELEASE_TABLET | Freq: Every day | ORAL | Status: DC
  Administered 2015-10-07: 09:00:00 81 mg via ORAL
  Filled 2015-10-06: qty 1

## 2015-10-06 MED ORDER — MIDAZOLAM HCL 5 MG/5ML IJ SOLN
INTRAMUSCULAR | Status: AC
  Filled 2015-10-06: qty 5

## 2015-10-06 MED ORDER — COLCHICINE 0.6 MG PO TABS
0.6000 mg | ORAL_TABLET | Freq: Two times a day (BID) | ORAL | Status: DC | PRN
  Administered 2015-10-06 – 2015-10-07 (×2): 0.6 mg via ORAL
  Filled 2015-10-06 (×2): qty 1

## 2015-10-06 MED ORDER — CEFAZOLIN SODIUM-DEXTROSE 2-3 GM-% IV SOLR: 2.0000 g | INTRAVENOUS | Status: DC

## 2015-10-06 MED ORDER — LIDOCAINE HCL (PF) 1 % IJ SOLN
INTRAMUSCULAR | Status: DC | PRN
  Administered 2015-10-06: 50 mL via SUBCUTANEOUS

## 2015-10-06 MED ORDER — COLCHICINE 0.6 MG PO TABS
0.6000 mg | ORAL_TABLET | Freq: Every day | ORAL | Status: DC | PRN
  Administered 2015-10-06: 16:00:00 0.6 mg via ORAL
  Filled 2015-10-06: qty 1

## 2015-10-06 MED ORDER — FEBUXOSTAT 80 MG PO TABS: 1.0000 | ORAL_TABLET | Freq: Every day | ORAL | Status: DC

## 2015-10-06 MED ORDER — HEPARIN (PORCINE) IN NACL 2-0.9 UNIT/ML-% IJ SOLN
INTRAMUSCULAR | Status: DC | PRN
  Administered 2015-10-06: 500 mL

## 2015-10-06 MED ORDER — SODIUM CHLORIDE 0.9 % IR SOLN
80.0000 mg | Status: DC
  Filled 2015-10-06: qty 2

## 2015-10-06 MED ORDER — LIDOCAINE HCL (PF) 1 % IJ SOLN
INTRAMUSCULAR | Status: AC
  Filled 2015-10-06: qty 30

## 2015-10-06 MED ORDER — ATORVASTATIN CALCIUM 80 MG PO TABS
80.0000 mg | ORAL_TABLET | Freq: Every day | ORAL | Status: DC
  Administered 2015-10-06: 80 mg via ORAL
  Filled 2015-10-06: qty 1

## 2015-10-06 MED ORDER — MUPIROCIN 2 % EX OINT
TOPICAL_OINTMENT | CUTANEOUS | Status: AC
  Filled 2015-10-06: qty 22

## 2015-10-06 MED ORDER — FEBUXOSTAT 40 MG PO TABS
80.0000 mg | ORAL_TABLET | Freq: Every day | ORAL | Status: DC
  Administered 2015-10-07: 09:00:00 80 mg via ORAL
  Filled 2015-10-06: qty 2

## 2015-10-06 SURGICAL SUPPLY — 6 items
CABLE SURGICAL S-101-97-12 (CABLE) ×2 IMPLANT
ICD ELLIPSE VR CD1411-36Q (ICD Generator) ×2 IMPLANT
LEAD DURATA 7122Q-65CM (Lead) ×2 IMPLANT
PAD DEFIB LIFELINK (PAD) ×2 IMPLANT
SHEATH CLASSIC 7F (SHEATH) ×2 IMPLANT
TRAY PACEMAKER INSERTION (PACKS) ×2 IMPLANT

## 2015-10-06 NOTE — Progress Notes (Addendum)
Creatinine elevated on pre-procedure labs. Discussed with Dr Shirlee LatchMcLean, recommends decreasing Torsemide to every other day at discharge.  Will need to recheck BMET at wound check appointment.  Appt made and order entered.   Gypsy BalsamAmber Crimson Dubberly, NP 10/06/2015 12:35 PM

## 2015-10-06 NOTE — H&P (Signed)
Electrophysiology Office Note   Date:  10/06/2015   ID:  James [name], DOB 06-26-1961, MRN [id number]  PCP:  Joaquin CourtsFAVERO,JOHN PATRICK, DO  Cardiologist:  Tora DuckHochrein, McLean Primary Electrophysiologist:  Regan LemmingWill Martin Jameon Deller, MD    No chief complaint on file.    History of Present Illness: James [name] is a 54 y.o. male who presents today for electrophysiology evaluation.   He has a history of ischemic cardiomyopathy, HTN, DM, and renal insufficiency. He has an EF of 15-20%. He has been on optimal medical therapy since at least 2014. He is able to exercise regularly. He has no acute complaints at this time other than gout in his foot.   Today, he denies symptoms of palpitations, chest pain, shortness of breath, orthopnea, PND, lower extremity edema, claudication, dizziness, presyncope, syncope, bleeding, or neurologic sequela. The patient is tolerating medications without difficulties and is otherwise without complaint today.    Past Medical History  Diagnosis Date  . Hypertension   . Diabetes mellitus   . Gout   . Chronic edema   . Obesity   . Coronary artery disease   . Ischemic cardiomyopathy     EF 25%  . CMI (chronic mesenteric ischemia) [facility](HCC)    Past Surgical History  Procedure Laterality Date  . Coronary artery bypass graft  2/12    Sequential left internal mammary graft to the mid and distal LAD, sequential sapenous vein graft to the first, second, and third obutuse marginal branches  of the  left circumflex  coronary artery.  Saphenous vein graft to the distal right coronary artery.     Current Facility-Administered Medications  Medication Dose Route Frequency Provider Last Rate Last Dose  . 0.9 %  sodium chloride infusion   Intravenous Continuous Benjy Kana Jorja LoaMartin Shavy Beachem, MD 50 mL/hr at 10/06/15 1040    . ceFAZolin (ANCEF) IVPB 2 g/50 mL premix  2 g Intravenous On Call Countess Biebel Jorja LoaMartin Omega Slager, MD      . gentamicin (GARAMYCIN) 80 mg in sodium chloride irrigation 0.9 % 500 mL  irrigation  80 mg Irrigation On Call Simran Bomkamp Jorja LoaMartin Annalie Wenner, MD      . mupirocin ointment (BACTROBAN) 2 %   Nasal BID Mona Ayars Jorja LoaMartin Jaishaun Mcnab, MD        Allergies:   Review of patient's allergies indicates no known allergies.   Social History:  The patient  reports that he has never smoked. He has never used smokeless tobacco. He reports that he drinks alcohol. He reports that he does not use illicit drugs.   Family History:  The patient's family history includes Diabetes in his other; Diabetes type II in his mother; Hypertension in his father, mother, other, paternal grandmother, and sister.    ROS:  Please see the history of present illness.     All other systems are reviewed and negative.    PHYSICAL EXAM: VS:  BP 123/90 mmHg  Pulse 65  Temp(Src) 97.5 F (36.4 C) (Oral)  Resp 18  Ht 6' (1.829 m)  Wt 239 lb (108.41 kg)  BMI 32.41 kg/m2  SpO2 100% , BMI Body mass index is 32.41 kg/(m^2). GEN: Well nourished, well developed, in no acute distress HEENT: normal Neck: no JVD, carotid bruits, or masses Cardiac: RRR; no murmurs, rubs, or gallops,no edema  Respiratory:  clear to auscultation bilaterally, normal work of breathing GI: soft, nontender, nondistended, + BS MS: no deformity or atrophy Skin: warm and dry Neuro:  Strength and sensation are intact Psych: euthymic mood,  full affect       Recent Labs: 08/01/2015: B Natriuretic Peptide 657.6* 10/06/2015: BUN 42*; Creatinine, Ser 1.95*; Hemoglobin 13.2; Platelets 207; Potassium 4.3; Sodium 136    Lipid Panel     Component Value Date/Time   CHOL 164 08/01/2015 1045   TRIG 72 08/01/2015 1045   HDL 65 08/01/2015 1045   CHOLHDL 2.5 08/01/2015 1045   VLDL 14 08/01/2015 1045   LDLCALC 85 08/01/2015 1045     Wt Readings from Last 3 Encounters:  10/06/15 239 lb (108.41 kg)  10/02/15 247 lb (112.038 kg)  08/18/15 239 lb (108.41 kg)      Other studies Reviewed: Additional studies/ records that were reviewed today  include: TTE 07/2015  Review of the above records today demonstrates:  - Left ventricle: The cavity size was severely dilated. Systolic function was vigorous. The estimated ejection fraction was in the range of 15% to 20%. Wall motion was normal; there were no regional wall motion abnormalities. Doppler parameters are consistent with a reversible restrictive pattern, indicative of decreased left ventricular diastolic compliance and/or increased left atrial pressure (grade 3 diastolic dysfunction). - Ventricular septum: The contour showed diastolic flattening and systolic flattening consistent with RV pressure overload. - Mitral valve: Transvalvular velocity was within the normal range. There was no evidence for stenosis. There was mild regurgitation. - Left atrium: The atrium was moderately to severely dilated. - Right ventricle: The cavity size was moderately dilated. Wall thickness was normal. - Pulmonic valve: There was trivial regurgitation.   ASSESSMENT AND PLAN:  1.  Chronic systolic congestive heart failure: Has NYHA class I-II symptoms. Has been on optimal therapy for >3 months. He has a RBBB on ECG. At this time, he does qualify for an ICD with either a IIa or IIb indication. Discussed the risks and benefits of the procedure including bleeding, infection, tamponade and pneumothorax.The patient is agreeable and we Ceceilia Cephus place ICD today.   Labs/ tests ordered today include:  Orders Placed This Encounter  Procedures  . Surgical pcr screen  . Basic metabolic panel  . CBC w/Diff  . CBC  . Basic metabolic panel  . Glucose, capillary  . Diet NPO time specified Except for: Sips with Meds  . Informed consent details: transcribe and obtain patient signature  . Electrode Placement  . For ICD and ICD generator change patients, confirm EKG has been done in the past 30 days.  If not place order and obtain test.  . If patient on heparin, discontinue at 0300 day  of procedure  . If patient on Lovenox, discontinue at midnight the night before procedure  . Use clippers to remove hair, entire chest area  . Verify informed consent  . Void on call to EP Lab  . Lab instructions  . AM of procedure hold 70/30 insulin dose (if ordered)  . AM of procedure hold oral hypoglycemic agents (if ordered)  . If patient on AM basal insulin: AM ON DAY OF PROCEDURE:  Give 1/2 basal dose (Lantus, Levemir, or NPH)  . EP PPM/ICD IMPLANT  . EKG 12-Lead  . EKG 12-Lead   Signed, Ludmila Ebarb Jorja Loa, MD  10/06/2015 11:40 AM     [facility] [address] [place] [address] [phone] (office) [phone] (fax)

## 2015-10-06 NOTE — Telephone Encounter (Signed)
Entresto 24-26 mg PA approved by [facility] through 10/05/2017.   Tyler DeisErika K. Bonnye FavaNicolsen, PharmD, BCPS, CPP Clinical Pharmacist Pager: [phone] Phone: [phone] 10/06/2015 3:20 PM

## 2015-10-07 ENCOUNTER — Encounter: Payer: Self-pay | Admitting: Physician Assistant

## 2015-10-07 ENCOUNTER — Ambulatory Visit (HOSPITAL_COMMUNITY): Payer: Medicare PPO

## 2015-10-07 DIAGNOSIS — I110 Hypertensive heart disease with heart failure: Secondary | ICD-10-CM | POA: Diagnosis not present

## 2015-10-07 DIAGNOSIS — I2510 Atherosclerotic heart disease of native coronary artery without angina pectoris: Secondary | ICD-10-CM | POA: Diagnosis not present

## 2015-10-07 DIAGNOSIS — I5022 Chronic systolic (congestive) heart failure: Secondary | ICD-10-CM | POA: Diagnosis not present

## 2015-10-07 DIAGNOSIS — I255 Ischemic cardiomyopathy: Secondary | ICD-10-CM | POA: Diagnosis not present

## 2015-10-07 LAB — GLUCOSE, CAPILLARY: Glucose-Capillary: 138 mg/dL — ABNORMAL HIGH (ref 65–99)

## 2015-10-07 MED ORDER — TORSEMIDE 20 MG PO TABS: 20.0000 mg | ORAL_TABLET | ORAL | Status: DC

## 2015-10-07 NOTE — Discharge Summary (Signed)
CARDIOLOGY DISCHARGE SUMMARY   Patient ID: James [name] MRN: [id number] DOB/AGE: [birth year] 54 y.o.  Admit date: 10/06/2015 Discharge date: 10/07/2015  PCP: Joaquin CourtsFAVERO,JOHN PATRICK, DO Primary Cardiologist: Dr Antoine PocheHochrein, Dr Shirlee LatchMcLean  Primary Discharge Diagnosis:  Chronic systolic CHF, class II Secondary Discharge Diagnosis:  Active Problems:   Cardiomyopathy (HCC)   CHF (congestive heart failure) (HCC)  Procedures: ICD insertion, chest x-ray  Hospital Course: James [name] is a 54 y.o. male with a history of ischemic cardiomyopathy, HTN, DM, RBBB, and renal insufficiency. He has an EF of 15-20%. He has been on optimal medical therapy since at least 2014.He meets MADIT II/ SCD-HeFT criteria for ICD implantation for primary prevention of sudden death.  He was seen by Dr. Elberta Fortisamnitz and the procedure was scheduled. He came to the hospital for this on 10/06/2015. His labs were reviewed by Dr. Shirlee LatchMcLean and his torsemide was decreased. His BUN and creatinine were higher than previous values.  A [employer]. Jude Medical AdaEllipse VR model [id number] (serial Number [id number]) ICD was inserted without immediate complication. He tolerated the procedure well.  On 10/07/2015, he was seen by Dr. Elberta Fortisamnitz and all data were reviewed. His chest x-ray was without significant abnormality. His site had no hematoma. His respiratory status was at baseline. No further inpatient workup was indicated and he is considered stable for discharge, to follow-up as an outpatient.   BP 97/65 mmHg  Pulse 72  Temp(Src) 97.9 F (36.6 C) (Oral)  Resp 18  Ht 6' (1.829 m)  Wt 249 lb 1.9 oz (113 kg)  BMI 33.78 kg/m2  SpO2 100% General: Well developed, well nourished, male in no acute distress Head: Eyes PERRLA, No xanthomas.   Normocephalic and atraumatic  Lungs: Clear bilaterally to auscultation. ICD site without hematoma Heart: HRRR S1 S2, without MRG.  Pulses are 2+ & equal. No JVD. Abdomen: Bowel sounds are present, abdomen  soft and non-tender without masses or  hernias noted. Msk: Normal strength and tone for age. Extremities: No clubbing, cyanosis or edema.    Skin:  No rashes or lesions noted. Neuro: Alert and oriented X 3. Psych:  Good affect, responds appropriately  Labs:   Lab Results  Component Value Date   WBC 10.4 10/06/2015   HGB 13.2 10/06/2015   HCT 40.3 10/06/2015   MCV 93.5 10/06/2015   PLT 207 10/06/2015     Recent Labs Lab 10/06/15 1030  NA 136  K 4.3  CL 104  CO2 21*  BUN 42*  CREATININE 1.95*  CALCIUM 8.6*  GLUCOSE 178*    Radiology: Dg Chest 2 View 10/07/2015  CLINICAL DATA:  Patient with placement of cardiac device. EXAM: CHEST  2 VIEW COMPARISON:  Chest radiograph 01/01/2011. FINDINGS: Interval insertion single lead AICD device. Patient status post median sternotomy. Stable enlarged cardiac and mediastinal contours. Low lung volumes. No large pulmonary consolidation. Small bilateral pleural effusions. Lateral view limited due to overlying soft tissue. Regional skeleton unremarkable. IMPRESSION: No active cardiopulmonary disease. Electronically Signed   By: Annia Beltrew  Davis M.D.   On: 10/07/2015 08:30   EKG: 10/07/2015 Sinus rhythm, right bundle branch block is old  FOLLOW UP PLANS AND APPOINTMENTS No Known Allergies   Medication List    TAKE these medications        aspirin EC 81 MG tablet  Take 81 mg by mouth daily.     atorvastatin 80 MG tablet  Commonly known as:  LIPITOR  TAKE ONE TABLET BY MOUTH DAILY  carvedilol 25 MG tablet  Commonly known as:  COREG  Take 2 tablets twice a day with meals     colchicine 0.6 MG tablet  Take 0.6 mg by mouth daily as needed (gout).     glipiZIDE 5 MG tablet  Commonly known as:  GLUCOTROL  Take 1 tablet (5 mg total) by mouth 2 (two) times daily before a meal.     isosorbide-hydrALAZINE 20-37.5 MG tablet  Commonly known as:  BIDIL  Take 1 tablet by mouth 3 (three) times daily.     nitroGLYCERIN 0.4 MG SL tablet   Commonly known as:  NITROSTAT  Place 1 tablet (0.4 mg total) under the tongue every 5 (five) minutes as needed for chest pain.     sacubitril-valsartan 24-26 MG  Commonly known as:  ENTRESTO  Take 1 tablet by mouth 2 (two) times daily.     TART CHERRY ADVANCED PO  Take 1 tablet by mouth daily as needed.     torsemide 20 MG tablet  Commonly known as:  DEMADEX  Take 1 tablet (20 mg total) by mouth every other day.     ULORIC 80 MG Tabs  Generic drug:  Febuxostat  Take 1 tablet by mouth daily.        Discharge Instructions    (HEART FAILURE PATIENTS) Call MD:  Anytime you have any of the following symptoms: 1) 3 pound weight gain in 24 hours or 5 pounds in 1 week 2) shortness of breath, with or without a dry hacking cough 3) swelling in the hands, feet or stomach 4) if you have to sleep on extra pillows at night in order to breathe.    Complete by:  As directed      Diet - low sodium heart healthy    Complete by:  As directed      Increase activity slowly    Complete by:  As directed           Follow-up Information    Follow up with [facility] On 10/19/2015.   Why:  Labs and wound check at 2:00. Please arrive early and get labs drawn before appointment.   Contact information:   [address] [place] [address] [phone]      BRING ALL MEDICATIONS WITH YOU TO FOLLOW UP APPOINTMENTS  Time spent with patient to include physician time: 41 min Signed: Theodore Demark, PA-C 10/07/2015, 9:27 AM Co-Sign MD   I have seen and examined this patient with Rhon or questionsda Barrett.  Agree with above, note added to reflect my findings.  On exam, regular rhythm, no murmurs, lungs clear.  Had ICD placed yesterday without issues.  Chest x-ray showed no abnormalities and device interrogation without issue. We'll plan on discharge home today with follow up in device clinic in 10 days.  Mannie Ohlin M. Catherin Doorn  MD 10/07/2015 11:53 AM

## 2015-10-07 NOTE — Discharge Instructions (Signed)
° °  Supplemental Discharge Instructions for  Pacemaker/Defibrillator Patients  Activity No heavy lifting or vigorous activity with your left/right arm for 6 to 8 weeks.  Do not raise your left/right arm above your head for one week.  Gradually raise your affected arm as drawn below.                       12/20                     12/21                              12/22                            12/23            NO DRIVING for  1 week    ; you may begin driving on     53/6612/24     . WOUND CARE - Keep the wound area clean and dry.  Do not get this area wet for one week. No showers for one week; you may shower on      12/24        . - The tape/steri-strips on your wound will fall off; do not pull them off.  No bandage is needed on the site.  DO  NOT apply any creams, oils, or ointments to the wound area. - If you notice any drainage or discharge from the wound, any swelling or bruising at the site, or you develop a fever > 101? F after you are discharged home, call the office at once.  Special Instructions - You are still able to use cellular telephones; use the ear opposite the side where you have your pacemaker/defibrillator.  Avoid carrying your cellular phone near your device. - When traveling through airports, show security personnel your identification card to avoid being screened in the metal detectors.  Ask the security personnel to use the hand wand. - Avoid arc welding equipment, MRI testing (magnetic resonance imaging), TENS units (transcutaneous nerve stimulators).  Call the office for questions about other devices. - Avoid electrical appliances that are in poor condition or are not properly grounded. - Microwave ovens are safe to be near or to operate.  Additional information for defibrillator patients should your device go off: - If your device goes off ONCE and you feel fine afterward, notify the device clinic nurses. - If your device goes off ONCE and you do not feel well  afterward, call 911. - If your device goes off TWICE, call 911. - If your device goes off THREE times in one day, call 911.  DO NOT DRIVE YOURSELF OR A FAMILY MEMBER WITH A DEFIBRILLATOR TO THE HOSPITAL--CALL 911.

## 2015-10-09 ENCOUNTER — Telehealth: Payer: Self-pay | Admitting: Cardiology

## 2015-10-09 MED FILL — Heparin Sodium (Porcine) 2 Unit/ML in Sodium Chloride 0.9%: INTRAMUSCULAR | Qty: 500 | Status: AC

## 2015-10-09 NOTE — Telephone Encounter (Signed)
New message      Pt had a procedure last Friday.  He has a question to ask about caring for the wound

## 2015-10-09 NOTE — Telephone Encounter (Signed)
Advised pt to keep steri-strips in place and to call device clinic if he noticed redness, swelling, drainage from incision. Pt verbalized understanding. Confirmed appt for wound check 10/19/15.

## 2015-10-17 ENCOUNTER — Telehealth: Payer: Self-pay | Admitting: Cardiology

## 2015-10-17 ENCOUNTER — Telehealth (HOSPITAL_COMMUNITY): Payer: Self-pay | Admitting: *Deleted

## 2015-10-17 MED ORDER — SACUBITRIL-VALSARTAN 24-26 MG PO TABS: 1.0000 | ORAL_TABLET | Freq: Two times a day (BID) | ORAL | Status: DC

## 2015-10-17 NOTE — Telephone Encounter (Signed)
Patient tells me he received a phone call with lab time.  States that lab time is scheduled for first thing in morning and his wound check isn't until afternoon.  What is he supposed to do? ([place]) Advised patient to come around the time of his wound check appt and we could draw lab work then. Explained that he does not need to come at time specified for the lab. He thanks me for clarifying.

## 2015-10-17 NOTE — Telephone Encounter (Signed)
New problem   Pt has question concerning his appt for 12.29.16. Please call pt.

## 2015-10-17 NOTE — Telephone Encounter (Signed)
Pt needed 30 day rx for Entresto sent to local pharmacy so he could use to 30 day free card, rx sent in

## 2015-10-19 ENCOUNTER — Other Ambulatory Visit (INDEPENDENT_AMBULATORY_CARE_PROVIDER_SITE_OTHER): Payer: Medicare PPO | Admitting: *Deleted

## 2015-10-19 ENCOUNTER — Encounter: Payer: Self-pay | Admitting: Cardiology

## 2015-10-19 ENCOUNTER — Ambulatory Visit (INDEPENDENT_AMBULATORY_CARE_PROVIDER_SITE_OTHER): Payer: Medicare PPO | Admitting: *Deleted

## 2015-10-19 DIAGNOSIS — I429 Cardiomyopathy, unspecified: Secondary | ICD-10-CM | POA: Diagnosis not present

## 2015-10-19 DIAGNOSIS — I5022 Chronic systolic (congestive) heart failure: Secondary | ICD-10-CM | POA: Diagnosis not present

## 2015-10-19 DIAGNOSIS — Z95810 Presence of automatic (implantable) cardiac defibrillator: Secondary | ICD-10-CM

## 2015-10-19 LAB — CUP PACEART INCLINIC DEVICE CHECK
Battery Remaining Longevity: 103.2
Brady Statistic RV Percent Paced: 0 %
Date Time Interrogation Session: 20161229145507
HIGH POWER IMPEDANCE MEASURED VALUE: 39 Ohm
HighPow Impedance: 48.375
Implantable Lead Implant Date: 20161216
Lead Channel Pacing Threshold Amplitude: 0.5 V
Lead Channel Pacing Threshold Pulse Width: 0.5 ms
Lead Channel Pacing Threshold Pulse Width: 0.5 ms
Lead Channel Sensing Intrinsic Amplitude: 11.2 mV
Lead Channel Setting Pacing Pulse Width: 0.5 ms
Lead Channel Setting Sensing Sensitivity: 0.5 mV
MDC IDC LEAD LOCATION: 753860
MDC IDC MSMT LEADCHNL RV IMPEDANCE VALUE: 425 Ohm
MDC IDC MSMT LEADCHNL RV PACING THRESHOLD AMPLITUDE: 0.5 V
MDC IDC PG SERIAL: 7308121
MDC IDC SET LEADCHNL RV PACING AMPLITUDE: 3.5 V

## 2015-10-19 LAB — BASIC METABOLIC PANEL
BUN: 49 mg/dL — ABNORMAL HIGH (ref 7–25)
CALCIUM: 8.5 mg/dL — AB (ref 8.6–10.3)
CHLORIDE: 101 mmol/L (ref 98–110)
CO2: 22 mmol/L (ref 20–31)
CREATININE: 1.89 mg/dL — AB (ref 0.70–1.33)
GLUCOSE: 263 mg/dL — AB (ref 65–99)
POTASSIUM: 5 mmol/L (ref 3.5–5.3)
SODIUM: 136 mmol/L (ref 135–146)

## 2015-10-19 NOTE — Addendum Note (Signed)
Addended by: Sydnee CabalMACK, Taden Witter R on: 10/19/2015 02:38 PM   Modules accepted: Orders

## 2015-10-19 NOTE — Progress Notes (Signed)
Wound check appointment. Steri-strips removed. Wound without redness or edema. Incision edges approximated, wound well healed. Normal device function. Threshold, sensing, and impedances consistent with implant measurements. Device programmed at 3.5V for extra safety margin until 3 month visit. Histogram distribution appropriate for patient and level of activity. No ventricular arrhythmias noted. Patient educated about wound care, arm mobility, lifting restrictions, shock plan. ROV w/ WC 01/09/16.

## 2015-10-26 ENCOUNTER — Encounter: Payer: Self-pay | Admitting: Cardiology

## 2015-10-26 NOTE — Telephone Encounter (Signed)
Follow up  ° ° °Patient calling back to speak with nurse  °

## 2015-10-26 NOTE — Telephone Encounter (Signed)
This encounter was created in error - please disregard.

## 2015-11-20 ENCOUNTER — Telehealth: Payer: Self-pay | Admitting: Cardiology

## 2015-11-20 NOTE — Telephone Encounter (Signed)
New message'   *STAT* If patient is at the pharmacy, call can be transferred to refill team.   1. Which medications need to be refilled? (please list name of each medication and dose if known) glipiZIDE (GLUCOTROL) 5 MG tablet  2. Which pharmacy/location (including street and city if local pharmacy) is medication to be sent to? Its usually through mail ]order however he would rather have it sent to the local [facility], on [address] in [place].   3. Do they need a 30 day or 90 day supply? 30 day

## 2015-11-20 NOTE — Telephone Encounter (Signed)
Pt called requesting a refill on Glipizide. Dr. Antoine Poche filled it last on 08/19/14. I informed him I would send this msg to NL. He is almost out so he needs [facility] [place] [place] & a 90 day to [facility] mail order. He is requesting a call back to comfirm Rx was sent in. [phone]

## 2015-11-21 NOTE — Telephone Encounter (Signed)
Pt say please call his medicine in today if possible,he really needs it.

## 2015-11-21 NOTE — Telephone Encounter (Signed)
Pt called again,says be sure to send his prescription to his local pharmacy and [facility] pharmacy.

## 2015-11-22 MED ORDER — GLIPIZIDE 5 MG PO TABS: 5.0000 mg | ORAL_TABLET | Freq: Two times a day (BID) | ORAL | Status: DC

## 2015-11-22 NOTE — Telephone Encounter (Signed)
Follow up      Patient calling back regarding refill medication

## 2015-11-22 NOTE — Telephone Encounter (Signed)
Medication refilled to local pharmacy. Also sent 90 day supply to [facility] at pt request. Pt aware to f/u w/ PCP for future refills.

## 2015-12-14 ENCOUNTER — Telehealth (HOSPITAL_COMMUNITY): Payer: Self-pay | Admitting: Vascular Surgery

## 2015-12-14 NOTE — Telephone Encounter (Signed)
Pt called he is out of his Legrand Como , he states he needs prior auth to get a refill

## 2015-12-15 NOTE — Telephone Encounter (Signed)
Already have PA approved for Entresto through [facility] but mail order pharmacy still had active Rx for lisinopril so would not cover his refill. I have called [employer] mail order pharmacy and discontinued Rx for lisinopril and resent urgent PA for Entresto. Spoke to patient who has enough Entresto to get him through until Monday and verified that he was not taking lisinopril concomitantly. Told him to call us on Monday if still not going through insurance.   [name]. Bonnye Fava, PharmD, BCPS, CPP Clinical Pharmacist Pager: [phone] Phone: [phone] 12/15/2015 4:21 PM

## 2015-12-18 ENCOUNTER — Telehealth (HOSPITAL_COMMUNITY): Payer: Self-pay | Admitting: Pharmacist

## 2015-12-18 NOTE — Telephone Encounter (Signed)
Entresto 24-26 mg PA re-approved by [facility] until 03/13/16.   [name]. Bonnye Fava, PharmD, BCPS, CPP Clinical Pharmacist Pager: [phone] Phone: [phone] 12/18/2015 12:22 PM

## 2015-12-20 ENCOUNTER — Other Ambulatory Visit (HOSPITAL_COMMUNITY): Payer: Self-pay | Admitting: Cardiology

## 2016-01-01 ENCOUNTER — Telehealth (HOSPITAL_COMMUNITY): Payer: Self-pay | Admitting: *Deleted

## 2016-01-01 MED ORDER — TORSEMIDE 20 MG PO TABS: ORAL_TABLET | ORAL | Status: DC

## 2016-01-01 NOTE — Telephone Encounter (Signed)
Pt called to report his wt has been increasing over past 6-7 weeks he has gained about 8 lbs.  He states he has been taking Torsemide 20 mg daily, our chart indicated QOD but he states he has been taking daily for quit a while now.  He denies SOB and states he is able to walk daily with no difficulties and can lay flat.  He does feel like his legs are a little tight/swollen, left more so than right.  He states when this has happened in the past Dr Antoine PocheHochrein would advise him to take more torsemide and it would always help.  We also discussed possibility of gaining wt, he states he follows a diet and has not been eating any differently and watches his fluid intake daily so he feels wt gain is fluid.  Will send to Dr Shirlee LatchMcLean for review and will call him back

## 2016-01-01 NOTE — Telephone Encounter (Signed)
Pt aware and agreeable.  

## 2016-01-01 NOTE — Telephone Encounter (Signed)
Increase torsemide to 40 daily x 4 days then 40 daily alternating with 20 daily after that.

## 2016-01-09 ENCOUNTER — Ambulatory Visit (INDEPENDENT_AMBULATORY_CARE_PROVIDER_SITE_OTHER): Payer: Medicare PPO | Admitting: Cardiology

## 2016-01-09 ENCOUNTER — Encounter: Payer: Self-pay | Admitting: Cardiology

## 2016-01-09 VITALS — BP 118/86 | HR 80 | Ht 72.0 in | Wt 250.0 lb

## 2016-01-09 DIAGNOSIS — Z4502 Encounter for adjustment and management of automatic implantable cardiac defibrillator: Secondary | ICD-10-CM

## 2016-01-09 DIAGNOSIS — I5022 Chronic systolic (congestive) heart failure: Secondary | ICD-10-CM

## 2016-01-09 NOTE — Patient Instructions (Signed)
Medication Instructions:  Your physician recommends that you continue on your current medications as directed. Please refer to the Current Medication list given to you today.  Labwork: None ordered  Testing/Procedures: None ordered  Follow-Up: Remote monitoring is used to monitor your Pacemaker of ICD from home. This monitoring reduces the number of office visits required to check your device to one time per year. It allows us to keep an eye on the functioning of your device to ensure it is working properly. You are scheduled for a device check from home on 04/09/2016. You may send your transmission at any time that day. If you have a wireless device, the transmission will be sent automatically. After your physician reviews your transmission, you will receive a postcard with your next transmission date.  Your physician wants you to follow-up in: 9 months with Dr. Elberta Fortisamnitz. You will receive a reminder letter in the mail two months in advance. If you don't receive a letter, please call our office to schedule the follow-up appointment.  If you need a refill on your cardiac medications before your next appointment, please call your pharmacy.  Thank you for choosing [facility]!!   Dory HornSherri Kalima Saylor, RN [phone]

## 2016-01-09 NOTE — Progress Notes (Signed)
Electrophysiology Office Note   Date:  01/09/2016   ID:  James [name], DOB 1961/07/29, MRN [id number]  PCP:  Joaquin Courts, DO  Cardiologist:  Rollene Rotunda, Marca Ancona Primary Electrophysiologist: Regan Lemming, MD    Chief Complaint  Patient presents with  . Pacemaker Check     History of Present Illness: James [name] is a 55 y.o. male who presents today for electrophysiology evaluation.   He has a history of ischemic cardiomyopathy, HTN, DM, and renal insufficiency.  He has an EF of 15-20%.  He has been on optimal medical therapy since at least 2014.  He had an ICD placed on 10/06/15.  Since then, he has done well.  He has been exercising walking up to half a mile a day. He does not have shortness of breath when he walks and has no PND or orthopnea.  Today, he denies symptoms of palpitations, chest pain, shortness of breath, orthopnea, PND, lower extremity edema, claudication, dizziness, presyncope, syncope, bleeding, or neurologic sequela. The patient is tolerating medications without difficulties and is otherwise without complaint today.    Past Medical History  Diagnosis Date  . Hypertension   . Gout   . Chronic edema   . Obesity   . Coronary artery disease   . Ischemic cardiomyopathy     EF 25%  . CMI (chronic mesenteric ischemia) (HCC)   . AICD (automatic cardioverter/defibrillator) present 09/2015    [facility] VR model GM0102-72Z (serial Number [id number]) ICD   . Type II diabetes mellitus (HCC)   . Myocardial infarction [facility])     "they saw I'd had one; not sure when but it was before 2012"  . Gout    Past Surgical History  Procedure Laterality Date  . Ep implantable device N/A 10/06/2015    Procedure: ICD Implant;  Surgeon: Azuri Bozard Jorja Loa, MD; [facility] [place] VR model [id number] (serial Number [id number]) ICD implanted L chest   . Shoulder open rotator cuff repair Right ~ 2000  . Coronary artery bypass graft  11/2010   Sequential left internal mammary graft to the mid and distal LAD, sequential sapenous vein graft to the first, second, and third obutuse marginal branches  of the  left circumflex  coronary artery.  Saphenous vein graft to the distal right coronary artery.  . Cardiac catheterization  11/2010     Current Outpatient Prescriptions  Medication Sig Dispense Refill  . aspirin EC 81 MG tablet Take 81 mg by mouth daily.    Marland Kitchen atorvastatin (LIPITOR) 80 MG tablet TAKE ONE TABLET BY MOUTH DAILY 90 tablet 3  . BIDIL 20-37.5 MG tablet TAKE 1 TABLET BY MOUTH 3 (THREE) TIMES DAILY. 90 tablet 6  . carvedilol (COREG) 25 MG tablet Take 25 mg by mouth 2 (two) times daily with a meal.    . colchicine 0.6 MG tablet Take 0.6 mg by mouth daily as needed (gout).     Marland Kitchen glipiZIDE (GLUCOTROL) 5 MG tablet Take 1 tablet (5 mg total) by mouth 2 (two) times daily before a meal. 180 tablet 0  . Misc Natural Products (TART CHERRY ADVANCED PO) Take 1 tablet by mouth daily as needed.     . nitroGLYCERIN (NITROSTAT) 0.4 MG SL tablet Place 1 tablet (0.4 mg total) under the tongue every 5 (five) minutes as needed for chest pain. 25 tablet 2  . sacubitril-valsartan (ENTRESTO) 24-26 MG Take 1 tablet by mouth 2 (two) times daily. 60 tablet 3  .  torsemide (DEMADEX) 20 MG tablet Take 1 tab every other day ALTERNATING with 2 tabs every other day 135 tablet 2  . ULORIC 80 MG TABS Take 1 tablet by mouth daily.  2   No current facility-administered medications for this visit.    Allergies:   Review of patient's allergies indicates no known allergies.   Social History:  The patient  reports that he has never smoked. He has quit using smokeless tobacco. His smokeless tobacco use included Chew. He reports that he drinks alcohol. He reports that he does not use illicit drugs.   Family History:  The patient's family history includes Diabetes in his other; Diabetes type II in his mother; Hypertension in his father, mother, other, paternal  grandmother, and sister.    ROS:  Please see the history of present illness.  All other systems are reviewed and negative.    PHYSICAL EXAM: VS:  BP 118/86 mmHg  Pulse 80  Ht 6' (1.829 m)  Wt 250 lb (113.399 kg)  BMI 33.90 kg/m2 , BMI Body mass index is 33.9 kg/(m^2). GEN: Well nourished, well developed, in no acute distress HEENT: normal Neck: no JVD, carotid bruits, or masses Cardiac: RRR; no murmurs, rubs, or gallops,no edema  Respiratory:  clear to auscultation bilaterally, normal work of breathing GI: soft, nontender, nondistended, + BS MS: no deformity or atrophy Skin: warm and dry, device site C/D/I Neuro:  Strength and sensation are intact Psych: euthymic mood, full affect  EKG:  EKG is ordered today. The ekg ordered today shows sinus rhythm, RBBB, anteroseptal infarct  Device interrogation reviewed today, results in PaceArt.  Recent Labs: 08/01/2015: B Natriuretic Peptide 657.6* 10/06/2015: Hemoglobin 13.2; Platelets 207 10/19/2015: BUN 49*; Creat 1.89*; Potassium 5.0; Sodium 136    Lipid Panel     Component Value Date/Time   CHOL 164 08/01/2015 1045   TRIG 72 08/01/2015 1045   HDL 65 08/01/2015 1045   CHOLHDL 2.5 08/01/2015 1045   VLDL 14 08/01/2015 1045   LDLCALC 85 08/01/2015 1045     Wt Readings from Last 3 Encounters:  01/09/16 250 lb (113.399 kg)  10/07/15 249 lb 1.9 oz (113 kg)  10/02/15 247 lb (112.038 kg)      Other studies Reviewed: Additional studies/ records that were reviewed today include: TTE 07/2015  Review of the above records today demonstrates:  - Left ventricle: The cavity size was severely dilated. Systolic function was vigorous. The estimated ejection fraction was in the range of 15% to 20%. Wall motion was normal; there were no regional wall motion abnormalities. Doppler parameters are consistent with a reversible restrictive pattern, indicative of decreased left ventricular diastolic compliance and/or  increased left atrial pressure (grade 3 diastolic dysfunction). - Ventricular septum: The contour showed diastolic flattening and systolic flattening consistent with RV pressure overload. - Mitral valve: Transvalvular velocity was within the normal range. There was no evidence for stenosis. There was mild regurgitation. - Left atrium: The atrium was moderately to severely dilated. - Right ventricle: The cavity size was moderately dilated. Wall thickness was normal. - Pulmonic valve: There was trivial regurgitation.   ASSESSMENT AND PLAN:  1.  Chronic systolic congestive heart failure: Has NYHA class I-II symptoms.  ICD placed 10/06/15 and tolerating well to this point.  He is able to exercise without issue, walking without shortness of breath. He shows no signs of heart failure today and does not appear volume overloaded. I have encouraged him to continue with exercise. We have made no  major changes in his device, and he has not had any worrisome arrhythmias.    Current medicines are reviewed at length with the patient today.   The patient does not have concerns regarding his medicines.  The following changes were made today:  none  Labs/ tests ordered today include:   No orders of the defined types were placed in this encounter.     Disposition:   FU with Makenzie Weisner 9 months.  Signed, Lonell Stamos Jorja LoaMartin Amayia Ciano, MD  01/09/2016 3:40 PM     [facility] [address] [place] [address] [phone] (office) [phone] (fax)

## 2016-01-10 LAB — CUP PACEART INCLINIC DEVICE CHECK
Battery Remaining Longevity: 103.2
HighPow Impedance: 46.125
Implantable Lead Implant Date: 20161216
Implantable Lead Location: 753860
Lead Channel Impedance Value: 412.5 Ohm
Lead Channel Pacing Threshold Pulse Width: 0.5 ms
Lead Channel Pacing Threshold Pulse Width: 0.5 ms
Lead Channel Setting Pacing Amplitude: 2.5 V
Lead Channel Setting Pacing Pulse Width: 0.5 ms
Lead Channel Setting Sensing Sensitivity: 0.5 mV
MDC IDC MSMT LEADCHNL RV PACING THRESHOLD AMPLITUDE: 0.75 V
MDC IDC MSMT LEADCHNL RV PACING THRESHOLD AMPLITUDE: 0.75 V
MDC IDC MSMT LEADCHNL RV SENSING INTR AMPL: 11 mV
MDC IDC PG SERIAL: 7308121
MDC IDC SESS DTM: 20170321192634
MDC IDC STAT BRADY RV PERCENT PACED: 0 %

## 2016-01-15 ENCOUNTER — Other Ambulatory Visit: Payer: Self-pay | Admitting: Cardiology

## 2016-01-22 ENCOUNTER — Telehealth (HOSPITAL_COMMUNITY): Payer: Self-pay | Admitting: Vascular Surgery

## 2016-01-22 MED ORDER — GLIPIZIDE 5 MG PO TABS: 5.0000 mg | ORAL_TABLET | Freq: Two times a day (BID) | ORAL | Status: DC

## 2016-01-22 NOTE — Telephone Encounter (Signed)
Spoke w/pt he is requesting refill on Glipizide, he states Dr Antoine PocheHochrein prescribed it last time but when he called them they told him to get it from us, advised since not HF med we do not prescribe, he needs to get it from PCP, he states he does have a pcp but he hasn't seen him in a while, he is agreeable to get an appt for refills but is out now, have sent in 1 months worth and advised pt further refills must come from PCP, he is agreeable

## 2016-01-22 NOTE — Telephone Encounter (Signed)
Pt would like the nurse to call him ASAP he has an urgent question about his medication

## 2016-02-16 ENCOUNTER — Encounter (HOSPITAL_COMMUNITY): Payer: Medicare PPO

## 2016-02-19 ENCOUNTER — Other Ambulatory Visit: Payer: Self-pay | Admitting: Cardiology

## 2016-02-19 NOTE — Telephone Encounter (Signed)
REFILL 

## 2016-02-19 NOTE — Telephone Encounter (Signed)
Rx request sent to pharmacy.  

## 2016-02-20 NOTE — Progress Notes (Signed)
Patient ID: James [name], male   DOB: 1961/02/21, 55 y.o.   MRN: [id number]    Advanced Heart Failure Clinic Note   PCP: Dr. Michel Santee Rheumatology: Dr. Kellie Simmering Cardiology: Dr. Antoine Poche HF Cardiology: Dr. Shirlee Latch  55 yo with history of gout, DM2, HTN, CKD, CAD s/p CABG, and chronic systolic CHF presents for CHF clinic evaluation.  He had CABG x 6 in 2/12.  He has a long-standing ischemic cardiomyopathy, most recent echo in 10/16 with EF 20-25%.  Main compliant has been gout.  He is now on Uloric.    Now s/p St Jude ICD 10/06/15.  He presents today for regular follow up. At last visit switched to [facility]. Weight down 3 lbs from last visit. Recently had to increase his torsemide to 40/20 mg alternating daily. Weight at home 250 even. Overall has felt better on increased torsemide.  Walks about 1/2 mile without much problem. No orthopnea no PND.  No chest pain. Taking all medications as directed. Following low salt diet, no added salt and tries to make low salt choices. Watches his fluids, Drinks less than 2L.  Lives at home with his wife. Disabled since 2012. No ICD shocks.  No dizziness or lightheadedness.   Labs (8/16): HCT 38.9, K 4.9, creatinine 1.81 Labs (08/01/2015) : K 3.7 Creatinine 1.49, BNP 658, LDL 85, HDL 65 Labs (09/18/2015): K 5.1 Creatinine 1.5  PMH: 1. Tophaceous gout 2. Hyperlipidemia 3. CKD 4. Type II diabetes 5. CAD: s/p CABG 2/12 with sequential LIMA-mid and distal LAD, sequential SVG to OM1/2/3, SVG-dRCA.   6. Chronic systolic CHF: Ischemic cardiomyopathy. Echo 2013 with EF 25%.  Echo (10/16) with EF 15-20%, restrictive diastolic function, moderately dilated RV with flattened interventricular septum, mild MR.  CPX (10/16) with peak VO2 11.5, VE/VCO2 slope 33.6, RER 1.19 => moderate to severe HF limitation.   SH: Lives in 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    FH: HTN.  Father with CVA.   ROS: All systems reviewed and negative except as per HPI.   Current  Outpatient Prescriptions  Medication Sig Dispense Refill  . aspirin EC 81 MG tablet Take 81 mg by mouth daily.    Marland Kitchen atorvastatin (LIPITOR) 80 MG tablet TAKE ONE TABLET BY MOUTH DAILY 90 tablet 3  . BIDIL 20-37.5 MG tablet TAKE 1 TABLET BY MOUTH 3 (THREE) TIMES DAILY. 90 tablet 6  . carvedilol (COREG) 25 MG tablet Take 1 tablet (25 mg total) by mouth 2 (two) times daily with a meal. Please schedule appointment for refills. 360 tablet 0  . glipiZIDE (GLUCOTROL) 5 MG tablet Take 1 tablet (5 mg total) by mouth 2 (two) times daily before a meal. 60 tablet 0  . sacubitril-valsartan (ENTRESTO) 24-26 MG Take 1 tablet by mouth 2 (two) times daily. 60 tablet 3  . torsemide (DEMADEX) 20 MG tablet Take 1 tab every other day ALTERNATING with 2 tabs every other day 135 tablet 2  . ULORIC 80 MG TABS Take 1 tablet by mouth daily.  2  . colchicine 0.6 MG tablet Take 0.6 mg by mouth daily as needed (gout). Reported on 02/21/2016    . Misc Natural Products (TART CHERRY ADVANCED PO) Take 1 tablet by mouth daily as needed. Reported on 02/21/2016    . nitroGLYCERIN (NITROSTAT) 0.4 MG SL tablet Place 1 tablet (0.4 mg total) under the tongue every 5 (five) minutes as needed for chest pain. (Patient not taking: Reported on 02/21/2016) 25 tablet 2   No current facility-administered medications  for this encounter.   BP 136/92 mmHg  Pulse 76  Wt 246 lb 12.8 oz (111.948 kg)  SpO2 100%   Wt Readings from Last 3 Encounters:  02/21/16 246 lb 12.8 oz (111.948 kg)  01/09/16 250 lb (113.399 kg)  10/07/15 249 lb 1.9 oz (113 kg)     General: NAD Neck: JVP 7-8 cm, no thyromegaly or thyroid nodule.  Lungs: Clear CV: Nondisplaced PMI.  Heart regular S1/S2, no S3/S4, no murmur.  Trace to 1+ ankle edema L>R,   No carotid bruit.  Normal pedal pulses.  Abdomen: Soft, NT, ND, no HSM. No bruits or masses. +BS  Skin: Intact without lesions or rashes. Prominent tophi noted on fingers and right calf.  Neurologic: Alert and oriented x 3.    Psych: Normal affect. Extremities: No clubbing or cyanosis. R and LLE   HEENT: Normal.   Assessment/Plan: 1. Chronic systolic CHF: Ischemic cardiomyopathy.  EF 25% in 2013, EF 20-25% on 10/16 echo with evidence for RV volume overload.  He has been stable over several years but LV systolic function has not improved.  NYHA class II symptoms by his report. CPX suggests a more significant functional limitation than he reports subjectively. He currently seems compensated but with a significant risk for clinical worsening over the next few years.   - s/p ST Jude ICD 09/2015 - Continue current Coreg 50 mg twice a day .  - Continue Bidil 20-37.5 mg tid.   - Increase entresto 49-51 mg twice a day. BMET today and next week. - If K/creatinine remain stable, Consider spiro at next visit.   - Continue torsemide 40 mg/20 mg alternating daily doses.   2. Gout: Significant tophaceous gout.  On Uloric.   3. CKD stage III: May be related to HTN, diabetes, hyperuricemia.  Creatinine most recently was 1.8.  4. CAD: s/p CABG.  No chest pain.  Continue ASA 81 and atorvastatin.  Lipids acceptable in 10/16.   Follow up in 4 weeks.  Med changes and labs as above.   Mariam DollarMichael Andrew Cayle Thunder PA-C 02/21/2016

## 2016-02-20 NOTE — Telephone Encounter (Signed)
Glipizide refill refused - defer to PCP Per previous refill, there was documentation on the patient med instructions to defer future refills to PCP

## 2016-02-21 ENCOUNTER — Telehealth (HOSPITAL_COMMUNITY): Payer: Self-pay | Admitting: Cardiology

## 2016-02-21 ENCOUNTER — Ambulatory Visit (HOSPITAL_COMMUNITY)
Admission: RE | Admit: 2016-02-21 | Discharge: 2016-02-21 | Disposition: A | Discharge status: Home / Self Care | Payer: Medicare PPO | Source: Ambulatory Visit | Attending: Cardiology | Admitting: Cardiology

## 2016-02-21 ENCOUNTER — Encounter (HOSPITAL_COMMUNITY): Payer: Self-pay

## 2016-02-21 VITALS — BP 136/92 | HR 76 | Wt 246.8 lb

## 2016-02-21 DIAGNOSIS — Z8249 Family history of ischemic heart disease and other diseases of the circulatory system: Secondary | ICD-10-CM | POA: Diagnosis not present

## 2016-02-21 DIAGNOSIS — Z823 Family history of stroke: Secondary | ICD-10-CM | POA: Insufficient documentation

## 2016-02-21 DIAGNOSIS — Z95810 Presence of automatic (implantable) cardiac defibrillator: Secondary | ICD-10-CM | POA: Diagnosis not present

## 2016-02-21 DIAGNOSIS — M1A9XX1 Chronic gout, unspecified, with tophus (tophi): Secondary | ICD-10-CM | POA: Diagnosis not present

## 2016-02-21 DIAGNOSIS — Z79899 Other long term (current) drug therapy: Secondary | ICD-10-CM | POA: Insufficient documentation

## 2016-02-21 DIAGNOSIS — I5022 Chronic systolic (congestive) heart failure: Secondary | ICD-10-CM | POA: Insufficient documentation

## 2016-02-21 DIAGNOSIS — Z951 Presence of aortocoronary bypass graft: Secondary | ICD-10-CM | POA: Insufficient documentation

## 2016-02-21 DIAGNOSIS — I255 Ischemic cardiomyopathy: Secondary | ICD-10-CM | POA: Diagnosis not present

## 2016-02-21 DIAGNOSIS — N183 Chronic kidney disease, stage 3 unspecified: Secondary | ICD-10-CM

## 2016-02-21 DIAGNOSIS — Z7984 Long term (current) use of oral hypoglycemic drugs: Secondary | ICD-10-CM | POA: Diagnosis not present

## 2016-02-21 DIAGNOSIS — E1122 Type 2 diabetes mellitus with diabetic chronic kidney disease: Secondary | ICD-10-CM | POA: Insufficient documentation

## 2016-02-21 DIAGNOSIS — M109 Gout, unspecified: Secondary | ICD-10-CM

## 2016-02-21 DIAGNOSIS — Z7982 Long term (current) use of aspirin: Secondary | ICD-10-CM | POA: Insufficient documentation

## 2016-02-21 DIAGNOSIS — I2510 Atherosclerotic heart disease of native coronary artery without angina pectoris: Secondary | ICD-10-CM | POA: Insufficient documentation

## 2016-02-21 DIAGNOSIS — I130 Hypertensive heart and chronic kidney disease with heart failure and stage 1 through stage 4 chronic kidney disease, or unspecified chronic kidney disease: Secondary | ICD-10-CM | POA: Insufficient documentation

## 2016-02-21 DIAGNOSIS — E785 Hyperlipidemia, unspecified: Secondary | ICD-10-CM | POA: Diagnosis not present

## 2016-02-21 LAB — BASIC METABOLIC PANEL
Anion gap: 7 (ref 5–15)
BUN: 45 mg/dL — AB (ref 6–20)
CO2: 22 mmol/L (ref 22–32)
CREATININE: 1.44 mg/dL — AB (ref 0.61–1.24)
Calcium: 8.5 mg/dL — ABNORMAL LOW (ref 8.9–10.3)
Chloride: 108 mmol/L (ref 101–111)
GFR calc Af Amer: >60 mL/min (ref >60)
GFR, EST NON AFRICAN AMERICAN: 54 mL/min — AB (ref >60)
GLUCOSE: 194 mg/dL — AB (ref 65–99)
POTASSIUM: 5.3 mmol/L — AB (ref 3.5–5.1)
Sodium: 137 mmol/L (ref 135–145)

## 2016-02-21 MED ORDER — SACUBITRIL-VALSARTAN 49-51 MG PO TABS: 1.0000 | ORAL_TABLET | Freq: Two times a day (BID) | ORAL | Status: DC

## 2016-02-21 MED ORDER — SACUBITRIL-VALSARTAN 24-26 MG PO TABS: 1.0000 | ORAL_TABLET | Freq: Two times a day (BID) | ORAL | Status: DC

## 2016-02-21 NOTE — Progress Notes (Signed)
Advanced Heart Failure Medication Review by a Pharmacist  Does the patient  feel that his/her medications are working for him/her?  yes  Has the patient been experiencing any side effects to the medications prescribed?  no  Does the patient measure his/her own blood pressure or blood glucose at home?  yes   Does the patient have any problems obtaining medications due to transportation or finances?   Yes - Entresto and Bidil copays increased from $47/mo to $150/mo  Understanding of regimen: good Understanding of indications: good Potential of compliance: good Patient understands to avoid NSAIDs. Patient understands to avoid decongestants.  Issues to address at subsequent visits: Entresto/Bidil cost   Pharmacist comments:  [name] is a pleasant 55 yo M presenting without a medication list but with excellent recall of his regimen. He reports good compliance with his medications but states that his copays for Entresto and Bidil have increased this month. I have enrolled him in [employer] foundation which will help with the cost of these medications. [facility] [place]   Billing ID: [id number]   Person Code: [id number]   RX Group: [id number]   RX BIN: [id number]   PCN for Part D: [id number]      Tyler DeisErika K. Bonnye FavaNicolsen, PharmD, BCPS, CPP Clinical Pharmacist Pager: [phone] Phone: [phone] 02/21/2016 12:16 PM      Time with patient: 10 minutes Preparation and documentation time: 10 minutes Total time: 20 minutes

## 2016-02-21 NOTE — Telephone Encounter (Signed)
Patient aware and voiced understanding Will repeat labs 5/5 and 5/12 HOLD increased dose of entresto 49/51, patient will continue 24-26 mg until advised otherwise

## 2016-02-21 NOTE — Patient Instructions (Signed)
INCREASE Entresto to 49/51mg , one tab twice a day  Labs today and again in 1 week  Your physician recommends that you schedule a follow-up appointment in: 4 weeks  Do the following things EVERYDAY: 1) Weigh yourself in the morning before breakfast. Write it down and keep it in a log. 2) Take your medicines as prescribed 3) Eat low salt foods-Limit salt (sodium) to 2000 mg per day.  4) Stay as active as you can everyday 5) Limit all fluids for the day to less than 2 liters 6)

## 2016-02-21 NOTE — Telephone Encounter (Signed)
-----   Message from Graciella FreerMichael Andrew Tillery, PA-C sent at 02/21/2016  2:16 PM EDT ----- Potassium up.   DO NOT start increased dose of Entresto.     Finish out the 24/26 dose he has, and needs to come back on Friday for recheck and sign for Veltassa (that helps his potassium not go up).    Casimiro NeedleMichael [address]"Andy" [place], PA-C 02/21/2016 2:16 PM

## 2016-02-23 ENCOUNTER — Telehealth (HOSPITAL_COMMUNITY): Payer: Self-pay | Admitting: Cardiology

## 2016-02-23 ENCOUNTER — Ambulatory Visit (HOSPITAL_COMMUNITY)
Admission: RE | Admit: 2016-02-23 | Discharge: 2016-02-23 | Disposition: A | Discharge status: Home / Self Care | Payer: Medicare PPO | Source: Ambulatory Visit | Attending: Cardiology | Admitting: Cardiology

## 2016-02-23 DIAGNOSIS — I5022 Chronic systolic (congestive) heart failure: Secondary | ICD-10-CM | POA: Insufficient documentation

## 2016-02-23 DIAGNOSIS — I5023 Acute on chronic systolic (congestive) heart failure: Secondary | ICD-10-CM

## 2016-02-23 LAB — BASIC METABOLIC PANEL
Anion gap: 11 (ref 5–15)
BUN: 42 mg/dL — AB (ref 6–20)
CO2: 21 mmol/L — ABNORMAL LOW (ref 22–32)
CREATININE: 1.58 mg/dL — AB (ref 0.61–1.24)
Calcium: 8.8 mg/dL — ABNORMAL LOW (ref 8.9–10.3)
Chloride: 106 mmol/L (ref 101–111)
GFR, EST AFRICAN AMERICAN: 56 mL/min — AB (ref >60)
GFR, EST NON AFRICAN AMERICAN: 49 mL/min — AB (ref >60)
Glucose, Bld: 160 mg/dL — ABNORMAL HIGH (ref 65–99)
POTASSIUM: 4.6 mmol/L (ref 3.5–5.1)
Sodium: 138 mmol/L (ref 135–145)

## 2016-02-23 MED ORDER — SACUBITRIL-VALSARTAN 49-51 MG PO TABS: 1.0000 | ORAL_TABLET | Freq: Two times a day (BID) | ORAL | Status: DC

## 2016-02-23 NOTE — Telephone Encounter (Signed)
k 4.6  Per [facility] Tillery,PA/ Nickolas MadridErika Nicholson, Pharm-D Patient should HOLD veltassa Increase entresto 49/51 mg, one tab twice a day Recheck labs x 1 week  Patient aware and voiced understanding

## 2016-03-01 ENCOUNTER — Ambulatory Visit (HOSPITAL_COMMUNITY)
Admission: RE | Admit: 2016-03-01 | Discharge: 2016-03-01 | Disposition: A | Discharge status: Home / Self Care | Payer: Medicare PPO | Source: Ambulatory Visit | Attending: Internal Medicine | Admitting: Internal Medicine

## 2016-03-01 DIAGNOSIS — I429 Cardiomyopathy, unspecified: Secondary | ICD-10-CM | POA: Insufficient documentation

## 2016-03-01 DIAGNOSIS — I5022 Chronic systolic (congestive) heart failure: Secondary | ICD-10-CM | POA: Diagnosis not present

## 2016-03-01 LAB — BASIC METABOLIC PANEL
ANION GAP: 12 (ref 5–15)
BUN: 41 mg/dL — ABNORMAL HIGH (ref 6–20)
CALCIUM: 8.7 mg/dL — AB (ref 8.9–10.3)
CO2: 21 mmol/L — ABNORMAL LOW (ref 22–32)
Chloride: 105 mmol/L (ref 101–111)
Creatinine, Ser: 1.53 mg/dL — ABNORMAL HIGH (ref 0.61–1.24)
GFR, EST AFRICAN AMERICAN: 59 mL/min — AB (ref >60)
GFR, EST NON AFRICAN AMERICAN: 51 mL/min — AB (ref >60)
GLUCOSE: 208 mg/dL — AB (ref 65–99)
POTASSIUM: 3.8 mmol/L (ref 3.5–5.1)
Sodium: 138 mmol/L (ref 135–145)

## 2016-03-13 ENCOUNTER — Other Ambulatory Visit (HOSPITAL_COMMUNITY): Payer: Self-pay | Admitting: Cardiology

## 2016-03-13 ENCOUNTER — Telehealth (HOSPITAL_COMMUNITY): Payer: Self-pay | Admitting: Surgery

## 2016-03-13 NOTE — Telephone Encounter (Signed)
[facility] [phone] James [name](Tori) called asking about prior auth for Valtessa.  Informed that medication is on hold and that [name] Pharm D is aware of prior Auth when and if needed.

## 2016-03-21 ENCOUNTER — Encounter (HOSPITAL_COMMUNITY): Payer: Medicare PPO

## 2016-03-29 ENCOUNTER — Telehealth (HOSPITAL_COMMUNITY): Payer: Self-pay | Admitting: Surgery

## 2016-03-29 NOTE — Telephone Encounter (Signed)
Patient is requesting a new follow-up appt.  [facility]

## 2016-04-09 ENCOUNTER — Ambulatory Visit (INDEPENDENT_AMBULATORY_CARE_PROVIDER_SITE_OTHER): Payer: Medicare PPO | Admitting: *Deleted

## 2016-04-09 ENCOUNTER — Telehealth: Payer: Self-pay | Admitting: Cardiology

## 2016-04-09 DIAGNOSIS — I429 Cardiomyopathy, unspecified: Secondary | ICD-10-CM

## 2016-04-09 NOTE — Telephone Encounter (Signed)
Spoke with pt and reminded pt of remote transmission that is due today. Pt verbalized understanding.   

## 2016-04-10 LAB — CUP PACEART REMOTE DEVICE CHECK
Battery Voltage: 3.19 V
HIGH POWER IMPEDANCE MEASURED VALUE: 47 Ohm
HighPow Impedance: 47 Ohm
Implantable Lead Implant Date: 20161216
Implantable Lead Location: 753860
Lead Channel Pacing Threshold Pulse Width: 0.5 ms
Lead Channel Setting Pacing Pulse Width: 0.5 ms
MDC IDC MSMT BATTERY REMAINING LONGEVITY: 98 mo
MDC IDC MSMT BATTERY REMAINING PERCENTAGE: 94 %
MDC IDC MSMT LEADCHNL RV IMPEDANCE VALUE: 410 Ohm
MDC IDC MSMT LEADCHNL RV PACING THRESHOLD AMPLITUDE: 0.75 V
MDC IDC MSMT LEADCHNL RV SENSING INTR AMPL: 10.8 mV
MDC IDC PG SERIAL: 7308121
MDC IDC SESS DTM: 20170620202150
MDC IDC SET LEADCHNL RV PACING AMPLITUDE: 2.5 V
MDC IDC SET LEADCHNL RV SENSING SENSITIVITY: 0.5 mV
MDC IDC STAT BRADY RV PERCENT PACED: 1 %

## 2016-04-10 NOTE — Progress Notes (Signed)
Remote ICD transmission.   

## 2016-04-12 ENCOUNTER — Encounter: Payer: Self-pay | Admitting: Cardiology

## 2016-04-19 ENCOUNTER — Other Ambulatory Visit: Payer: Self-pay

## 2016-04-23 ENCOUNTER — Other Ambulatory Visit: Payer: Self-pay | Admitting: Cardiology

## 2016-04-26 ENCOUNTER — Other Ambulatory Visit: Payer: Self-pay

## 2016-04-26 NOTE — Telephone Encounter (Signed)
glipiZIDE (GLUCOTROL) 5 MG tablet  Medication   Date: 04/24/2016  [facility]  Ordering/Authorizing: Rollene RotundaJames Hochrein, MD      Order Providers    Prescribing Provider Encounter Provider   Rollene RotundaJames Hochrein, MD Rollene RotundaJames Hochrein, MD    Medication Detail      Disp Refills Start End     glipiZIDE (GLUCOTROL) 5 MG tablet 180 tablet 0 04/24/2016     Sig: TAKE 1 TABLET (5 MG TOTAL) BY MOUTH 2 (TWO) TIMES DAILY BEFORE MEALS (REFER FUTURE REFILLS TO PRIMARY CARE PHYSICIAN)    E-Prescribing Status: Receipt confirmed by pharmacy (04/24/2016 2:58 PM EDT)     Ph[facility]    [facility] [place] [facility] [id number]   Refills on file with [facility] as of 04/24/16. Call [phone] and move

## 2016-04-30 ENCOUNTER — Encounter (HOSPITAL_COMMUNITY): Payer: Medicare PPO

## 2016-05-06 ENCOUNTER — Other Ambulatory Visit (HOSPITAL_COMMUNITY): Payer: Self-pay | Admitting: *Deleted

## 2016-05-06 MED ORDER — CARVEDILOL 25 MG PO TABS: 25.0000 mg | ORAL_TABLET | Freq: Two times a day (BID) | ORAL | Status: DC

## 2016-05-07 ENCOUNTER — Other Ambulatory Visit (HOSPITAL_COMMUNITY): Payer: Self-pay | Admitting: *Deleted

## 2016-05-14 ENCOUNTER — Ambulatory Visit (HOSPITAL_COMMUNITY)
Admission: RE | Admit: 2016-05-14 | Discharge: 2016-05-14 | Disposition: A | Discharge status: Home / Self Care | Payer: Medicare PPO | Source: Ambulatory Visit | Attending: Cardiology | Admitting: Cardiology

## 2016-05-14 VITALS — BP 118/85 | HR 85 | Wt 239.0 lb

## 2016-05-14 DIAGNOSIS — Z7982 Long term (current) use of aspirin: Secondary | ICD-10-CM | POA: Diagnosis not present

## 2016-05-14 DIAGNOSIS — I10 Essential (primary) hypertension: Secondary | ICD-10-CM | POA: Diagnosis not present

## 2016-05-14 DIAGNOSIS — N183 Chronic kidney disease, stage 3 unspecified: Secondary | ICD-10-CM

## 2016-05-14 DIAGNOSIS — E785 Hyperlipidemia, unspecified: Secondary | ICD-10-CM | POA: Insufficient documentation

## 2016-05-14 DIAGNOSIS — I2510 Atherosclerotic heart disease of native coronary artery without angina pectoris: Secondary | ICD-10-CM | POA: Diagnosis not present

## 2016-05-14 DIAGNOSIS — M109 Gout, unspecified: Secondary | ICD-10-CM

## 2016-05-14 DIAGNOSIS — M1A9XX1 Chronic gout, unspecified, with tophus (tophi): Secondary | ICD-10-CM | POA: Diagnosis not present

## 2016-05-14 DIAGNOSIS — Z823 Family history of stroke: Secondary | ICD-10-CM | POA: Diagnosis not present

## 2016-05-14 DIAGNOSIS — E1122 Type 2 diabetes mellitus with diabetic chronic kidney disease: Secondary | ICD-10-CM | POA: Diagnosis not present

## 2016-05-14 DIAGNOSIS — Z8249 Family history of ischemic heart disease and other diseases of the circulatory system: Secondary | ICD-10-CM | POA: Insufficient documentation

## 2016-05-14 DIAGNOSIS — I5022 Chronic systolic (congestive) heart failure: Secondary | ICD-10-CM | POA: Diagnosis present

## 2016-05-14 DIAGNOSIS — I255 Ischemic cardiomyopathy: Secondary | ICD-10-CM | POA: Insufficient documentation

## 2016-05-14 DIAGNOSIS — Z951 Presence of aortocoronary bypass graft: Secondary | ICD-10-CM | POA: Diagnosis not present

## 2016-05-14 DIAGNOSIS — I130 Hypertensive heart and chronic kidney disease with heart failure and stage 1 through stage 4 chronic kidney disease, or unspecified chronic kidney disease: Secondary | ICD-10-CM | POA: Insufficient documentation

## 2016-05-14 DIAGNOSIS — Z79899 Other long term (current) drug therapy: Secondary | ICD-10-CM | POA: Insufficient documentation

## 2016-05-14 DIAGNOSIS — Z7984 Long term (current) use of oral hypoglycemic drugs: Secondary | ICD-10-CM | POA: Insufficient documentation

## 2016-05-14 NOTE — Addendum Note (Signed)
Encounter addended by: Graciella Freer, PA-C on: 05/14/2016  2:38 PM<BR>    Actions taken: Sign clinical note

## 2016-05-14 NOTE — Patient Instructions (Signed)
Your physician recommends that you schedule a follow-up appointment in: 6-8 weeks with Dr Shirlee Latch and a echocardiogram  Your physician has requested that you have an echocardiogram. Echocardiography is a painless test that uses sound waves to create images of your heart. It provides your doctor with information about the size and shape of your heart and how well your heart's chambers and valves are working. This procedure takes approximately one hour. There are no restrictions for this procedure.

## 2016-05-14 NOTE — Progress Notes (Addendum)
Patient ID: James [name], male   DOB: 26-Jun-1961, 55 y.o.   MRN: [id number]     Advanced Heart Failure Clinic Note   PCP: Dr. Michel Santee Rheumatology: Dr. Kellie Simmering -> Now Dr. Dierdre Forth Cardiology: Dr. Antoine Poche HF Cardiology: Dr. Shirlee Latch  55 yo with history of gout, DM2, HTN, CKD, CAD s/p CABG, and chronic systolic CHF presents for CHF clinic evaluation.  He had CABG x 6 in 2/12.  He has a long-standing ischemic cardiomyopathy, most recent echo in 10/16 with EF 20-25%.  Main compliant has been gout.  He is now on Uloric.    Now s/p St Jude ICD 10/06/15.  He presents today for regular follow up. At last visit increase Entresto. Weight down 7 lbs since last visit. Most complaints are from gout, but having less problems. Now seeing Dr. Dierdre Forth and making some progress.  Breathing has been fine. Denies orthopnea or PND. Unsure how far he can walk with significant gout. Can walk up to a mile at a slow pace without SOB. No CP. Watching fluids and salts. No lightheadedness or dizziness.   Labs (8/16): HCT 38.9, K 4.9, creatinine 1.81 Labs (08/01/2015) : K 3.7 Creatinine 1.49, BNP 658, LDL 85, HDL 65 Labs (09/18/2015): K 5.1 Creatinine 1.5 Labs (02/23/2016) K 4.6, Creatinine 1.58 Labs (03/01/2016): K 3.8, Creatinine 1.53  PMH: 1. Tophaceous gout 2. Hyperlipidemia 3. CKD 4. Type II diabetes 5. CAD: s/p CABG 2/12 with sequential LIMA-mid and distal LAD, sequential SVG to OM1/2/3, SVG-dRCA.   6. Chronic systolic CHF: Ischemic cardiomyopathy. Echo 2013 with EF 25%.  Echo (10/16) with EF 15-20%, restrictive diastolic function, moderately dilated RV with flattened interventricular septum, mild MR.  CPX (10/16) with peak VO2 11.5, VE/VCO2 slope 33.6, RER 1.19 => moderate to severe HF limitation.   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    FH: HTN.  Father with CVA.   ROS: All systems reviewed and negative except as per HPI.   Current Outpatient Prescriptions  Medication Sig Dispense  Refill  . aspirin EC 81 MG tablet Take 81 mg by mouth daily.    Marland Kitchen atorvastatin (LIPITOR) 80 MG tablet TAKE ONE TABLET BY MOUTH DAILY 90 tablet 3  . BIDIL 20-37.5 MG tablet TAKE 1 TABLET BY MOUTH 3 (THREE) TIMES DAILY. 90 tablet 6  . carvedilol (COREG) 25 MG tablet Take 1 tablet (25 mg total) by mouth 2 (two) times daily with a meal. Please schedule appointment for refills. 360 tablet 0  . colchicine 0.6 MG tablet Take 0.6 mg by mouth daily as needed (gout). Reported on 02/21/2016    . glipiZIDE (GLUCOTROL) 5 MG tablet TAKE 1 TABLET (5 MG TOTAL) BY MOUTH 2 (TWO) TIMES DAILY BEFORE MEALS (REFER FUTURE REFILLS TO PRIMARY CARE PHYSICIAN) 180 tablet 0  . Misc Natural Products (TART CHERRY ADVANCED PO) Take 1 tablet by mouth daily as needed. Reported on 02/21/2016    . sacubitril-valsartan (ENTRESTO) 49-51 MG Take 1 tablet by mouth 2 (two) times daily. 60 tablet 6  . torsemide (DEMADEX) 20 MG tablet Take 1 tab every other day ALTERNATING with 2 tabs every other day 135 tablet 2  . ULORIC 80 MG TABS Take 1 tablet by mouth daily.  2  . nitroGLYCERIN (NITROSTAT) 0.4 MG SL tablet Place 1 tablet (0.4 mg total) under the tongue every 5 (five) minutes as needed for chest pain. (Patient not taking: Reported on 02/21/2016) 25 tablet 2   No current facility-administered medications for this encounter.  BP 118/85 (BP Location: Right Arm, Patient Position: Sitting, Cuff Size: Normal)   Pulse 85   Wt 239 lb (108.4 kg)   SpO2 100%   BMI 32.41 kg/m    Wt Readings from Last 3 Encounters:  05/14/16 239 lb (108.4 kg)  02/21/16 246 lb 12.8 oz (111.9 kg)  01/09/16 250 lb (113.4 kg)     General: NAD Neck: JVP 6-7 cm, no thyromegaly or thyroid nodule. Lungs: CTAB, normal effort CV: Nondisplaced PMI.  Heart regular S1/S2, no S3/S4, no murmur.  No carotid bruit.  Normal pedal pulses.  Abdomen: Soft, non-tender, non-distended, no HSM. No bruits or masses. +BS  Skin: Intact without lesions or rashes Neurologic: Alert  and oriented x 3.  Psych: Normal affect. Extremities: No clubbing or cyanosis. Trace ankle edema. Prominent tophi noted on fingers and right calf.  HEENT: Normal.   Assessment/Plan: 1. Chronic systolic CHF: Ischemic cardiomyopathy.  EF 25% in 2013, EF 20-25% on 10/16 echo with evidence for RV volume overload.  He has been stable over several years but LV systolic function has not improved.  CPX has suggested a more significant functional limitation than he reports subjectively.  - NYHA II currently, mostly limited by joint pain/gout.  - s/p ST Jude ICD 09/2015 - Continue current Coreg 50 mg twice a day .  - Continue Bidil 20-37.5 mg tid.   - Continue entresto 49-51 mg BID. BMET today and next week. - Will request recent labwork from Dr Dierdre Forth office. - Will hold off on spiro with hx of hyperkalemia - Continue torsemide 40 mg/20 mg alternating daily doses.  Can take 40 mg daily as needed.  - Will schedule repeat echo for next follow up.  2. Gout:  - Severe tophaceous gout.  - On Uloric.  Seeing Dr Dierdre Forth now. Sees him again in a few weeks.  Thinks recent adjustments have helped.  3. CKD stage III: May be related to HTN, diabetes, hyperuricemia.   - Creatinine stable recently.  Will recheck BMET today with Entresto changes.  4. CAD: s/p CABG.  No chest pain.  Continue ASA 81 and atorvastatin.  Lipids acceptable in 10/16.  5. HTN - Managing in the setting of adjusting his HF meds. Stable on current regimen as above.   Follow up 6-8 weeks with Echo. Will get recent BMET from Dr Dierdre Forth office. Will hold off on uptitrating meds for now with ongoing adjustment of Uloric due to renal dosing.    Mariam Dollar Tillery PA-C 05/14/2016   Addendum:  Labs from Dr. Dierdre Forth office 04/01/16 K 5.0, Creatinine 1.61 04/30/16 K 4.3, Creatinine 1.53  Will continue to hold off on spironolactone with recurrent borderline hyperkalemia.    Casimiro Needle [address] [place], [place] 05/14/2016 2:38 PM

## 2016-05-20 ENCOUNTER — Other Ambulatory Visit (HOSPITAL_COMMUNITY): Payer: Self-pay | Admitting: Cardiology

## 2016-06-25 ENCOUNTER — Encounter (HOSPITAL_COMMUNITY): Payer: Medicare PPO

## 2016-06-25 ENCOUNTER — Other Ambulatory Visit (HOSPITAL_COMMUNITY): Payer: Medicare PPO

## 2016-07-09 ENCOUNTER — Ambulatory Visit (HOSPITAL_COMMUNITY)
Admission: RE | Admit: 2016-07-09 | Discharge: 2016-07-09 | Disposition: A | Discharge status: Home / Self Care | Payer: Medicare PPO | Source: Ambulatory Visit | Attending: Cardiology | Admitting: Cardiology

## 2016-07-09 ENCOUNTER — Encounter (HOSPITAL_COMMUNITY): Payer: Self-pay

## 2016-07-09 ENCOUNTER — Ambulatory Visit (HOSPITAL_BASED_OUTPATIENT_CLINIC_OR_DEPARTMENT_OTHER)
Admission: RE | Admit: 2016-07-09 | Discharge: 2016-07-09 | Disposition: A | Discharge status: Home / Self Care | Payer: Medicare PPO | Source: Ambulatory Visit | Attending: Cardiology | Admitting: Cardiology

## 2016-07-09 ENCOUNTER — Encounter (HOSPITAL_COMMUNITY): Payer: Self-pay | Admitting: *Deleted

## 2016-07-09 VITALS — BP 122/82 | HR 68 | Wt 246.5 lb

## 2016-07-09 DIAGNOSIS — Z7982 Long term (current) use of aspirin: Secondary | ICD-10-CM | POA: Insufficient documentation

## 2016-07-09 DIAGNOSIS — Z95810 Presence of automatic (implantable) cardiac defibrillator: Secondary | ICD-10-CM | POA: Insufficient documentation

## 2016-07-09 DIAGNOSIS — I4510 Unspecified right bundle-branch block: Secondary | ICD-10-CM | POA: Insufficient documentation

## 2016-07-09 DIAGNOSIS — Z951 Presence of aortocoronary bypass graft: Secondary | ICD-10-CM | POA: Insufficient documentation

## 2016-07-09 DIAGNOSIS — I255 Ischemic cardiomyopathy: Secondary | ICD-10-CM | POA: Diagnosis not present

## 2016-07-09 DIAGNOSIS — E785 Hyperlipidemia, unspecified: Secondary | ICD-10-CM | POA: Diagnosis not present

## 2016-07-09 DIAGNOSIS — E1122 Type 2 diabetes mellitus with diabetic chronic kidney disease: Secondary | ICD-10-CM | POA: Diagnosis not present

## 2016-07-09 DIAGNOSIS — I5022 Chronic systolic (congestive) heart failure: Secondary | ICD-10-CM | POA: Diagnosis not present

## 2016-07-09 DIAGNOSIS — M109 Gout, unspecified: Secondary | ICD-10-CM | POA: Diagnosis not present

## 2016-07-09 DIAGNOSIS — N183 Chronic kidney disease, stage 3 unspecified: Secondary | ICD-10-CM

## 2016-07-09 DIAGNOSIS — N189 Chronic kidney disease, unspecified: Secondary | ICD-10-CM | POA: Diagnosis not present

## 2016-07-09 DIAGNOSIS — I2510 Atherosclerotic heart disease of native coronary artery without angina pectoris: Secondary | ICD-10-CM | POA: Insufficient documentation

## 2016-07-09 DIAGNOSIS — Z823 Family history of stroke: Secondary | ICD-10-CM | POA: Insufficient documentation

## 2016-07-09 DIAGNOSIS — Z8249 Family history of ischemic heart disease and other diseases of the circulatory system: Secondary | ICD-10-CM | POA: Insufficient documentation

## 2016-07-09 DIAGNOSIS — I130 Hypertensive heart and chronic kidney disease with heart failure and stage 1 through stage 4 chronic kidney disease, or unspecified chronic kidney disease: Secondary | ICD-10-CM | POA: Diagnosis not present

## 2016-07-09 LAB — BASIC METABOLIC PANEL
Anion gap: 10 (ref 5–15)
BUN: 35 mg/dL — AB (ref 6–20)
CALCIUM: 9 mg/dL (ref 8.9–10.3)
CO2: 22 mmol/L (ref 22–32)
CREATININE: 1.7 mg/dL — AB (ref 0.61–1.24)
Chloride: 107 mmol/L (ref 101–111)
GFR calc Af Amer: 51 mL/min — ABNORMAL LOW (ref >60)
GFR, EST NON AFRICAN AMERICAN: 44 mL/min — AB (ref >60)
GLUCOSE: 124 mg/dL — AB (ref 65–99)
POTASSIUM: 3.7 mmol/L (ref 3.5–5.1)
Sodium: 139 mmol/L (ref 135–145)

## 2016-07-09 LAB — BRAIN NATRIURETIC PEPTIDE: B Natriuretic Peptide: 2597.9 pg/mL — ABNORMAL HIGH (ref 0.0–100.0)

## 2016-07-09 MED ORDER — TORSEMIDE 20 MG PO TABS: ORAL_TABLET | ORAL | 2 refills | Status: DC

## 2016-07-09 MED ORDER — SPIRONOLACTONE 25 MG PO TABS: 12.5000 mg | ORAL_TABLET | Freq: Every day | ORAL | 3 refills | Status: DC

## 2016-07-09 NOTE — Patient Instructions (Signed)
START Spironolactone 12.5mg  (1/2 tablet) daily.  TAKE Torsemide 40mg  (2 tablets) every other day ALTERNATING with 60mg  (3 tablets) every other day.  Routine lab work today. Will notify you of abnormal results  Your provider requests you have a Right Heart Cath.  *please see attached CATH LETTER*  Follow up with Dr.McLean in 2 weeks,

## 2016-07-09 NOTE — Progress Notes (Addendum)
Patient ID: James [name], male   DOB: Jun 28, 1961, 55 y.o.   MRN: [id number]     Advanced Heart Failure Clinic Note   PCP: Dr. Michel Santee Rheumatology: Dr. Kellie Simmering -> Now Dr. Dierdre Forth Cardiology: Dr. Antoine Poche HF Cardiology: Dr. Shirlee Latch  55 yo with history of gout, DM2, HTN, CKD, CAD s/p CABG, and chronic systolic CHF presents for CHF clinic evaluation.  He had CABG x 6 in 2/12.  He has a long-standing ischemic cardiomyopathy, echo in 10/16 with EF 20-25%.  He also has severe tophaceous gout.     Now s/p St Jude ICD 10/06/15.  Symptomatically, says he is doing ok.  Gout pain improved.  Weight is up 7 lbs.  He can walk 6-7 laps slowly at the [facility].  No orthopnea/PND.  Dyspnea with incline/hill/walking fast.  No chest pain.    Labs (8/16): HCT 38.9, K 4.9, creatinine 1.81 Labs (08/01/2015) : K 3.7 Creatinine 1.49, BNP 658, LDL 85, HDL 65 Labs (09/18/2015): K 5.1 Creatinine 1.5 Labs (02/23/2016) K 4.6, Creatinine 1.58 Labs (03/01/2016): K 3.8, Creatinine 1.53 Labs (7/17): K 4.3, creatinine 1.53  PMH: 1. Tophaceous gout: Sees Dr Dierdre Forth.  2. Hyperlipidemia 3. CKD 4. Type II diabetes 5. CAD: s/p CABG 2/12 with sequential LIMA-mid and distal LAD, sequential SVG to OM1/2/3, SVG-dRCA.   6. Chronic systolic CHF: Ischemic cardiomyopathy. Echo 2013 with EF 25%.  Echo (10/16) with EF 15-20%, restrictive diastolic function, moderately dilated RV with flattened interventricular septum, mild MR.   - CPX (10/16) with peak VO2 11.5, VE/VCO2 slope 33.6, RER 1.19 => moderate to severe HF limitation.  - Echo (9/17): EF 15% with mild LV dilation, mild RV dilation with mild to moderately decreased systolic function, moderate MR, PASP 69 mmHg, IVC dilated.  - St Jude ICD.   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    FH: HTN.  Father with CVA.   ROS: All systems reviewed and negative except as per HPI.   Current Outpatient Prescriptions  Medication Sig Dispense Refill  . aspirin EC 81 MG  tablet Take 81 mg by mouth daily.    Marland Kitchen atorvastatin (LIPITOR) 80 MG tablet TAKE ONE TABLET BY MOUTH DAILY 90 tablet 3  . BIDIL 20-37.5 MG tablet TAKE 1 TABLET BY MOUTH 3 (THREE) TIMES DAILY. 90 tablet 6  . carvedilol (COREG) 25 MG tablet Take 1 tablet (25 mg total) by mouth 2 (two) times daily with a meal. Please schedule appointment for refills. 360 tablet 0  . colchicine 0.6 MG tablet Take 0.6 mg by mouth daily as needed (gout). Reported on 02/21/2016    . glipiZIDE (GLUCOTROL) 5 MG tablet TAKE 1 TABLET (5 MG TOTAL) BY MOUTH 2 (TWO) TIMES DAILY BEFORE MEALS (REFER FUTURE REFILLS TO PRIMARY CARE PHYSICIAN) 180 tablet 0  . Misc Natural Products (TART CHERRY ADVANCED PO) Take 1 tablet by mouth daily as needed. Reported on 02/21/2016    . sacubitril-valsartan (ENTRESTO) 49-51 MG Take 1 tablet by mouth 2 (two) times daily. 60 tablet 6  . torsemide (DEMADEX) 20 MG tablet Take 2 tab every other day ALTERNATING with 3 tabs every other day 75 tablet 2  . ULORIC 80 MG TABS Take 1 tablet by mouth daily.  2  . nitroGLYCERIN (NITROSTAT) 0.4 MG SL tablet Place 1 tablet (0.4 mg total) under the tongue every 5 (five) minutes as needed for chest pain. (Patient not taking: Reported on 07/09/2016) 25 tablet 2  . spironolactone (ALDACTONE) 25 MG tablet Take 0.5  tablets (12.5 mg total) by mouth daily. 45 tablet 3   No current facility-administered medications for this encounter.    BP 122/82   Pulse 68   Wt 246 lb 8 oz (111.8 kg)   SpO2 100%   BMI 33.43 kg/m    Wt Readings from Last 3 Encounters:  07/09/16 246 lb 8 oz (111.8 kg)  05/14/16 239 lb (108.4 kg)  02/21/16 246 lb 12.8 oz (111.9 kg)     General: NAD Neck: JVP 12 cm, no thyromegaly or thyroid nodule. Lungs: CTAB, normal effort CV: Nondisplaced PMI.  Heart regular S1/S2, no S3/S4, no murmur.  No carotid bruit.  Normal pedal pulses.  Abdomen: Soft, non-tender, non-distended, no HSM. No bruits or masses. +BS  Skin: Intact without lesions or  rashes Neurologic: Alert and oriented x 3.  Psych: Normal affect. Extremities: No clubbing or cyanosis. 2+ edema to knees bilaterally. Prominent tophi noted on fingers and right calf.  HEENT: Normal.   Assessment/Plan: 1. Chronic systolic CHF: Ischemic cardiomyopathy.  EF 25% in 2013, EF 20-25% on 10/16 echo with evidence for RV volume overload.  He has seemed to be stable symptomatically over several years but LV systolic function has not improved.  Most recent echo was done today and reviewed: EF 15%, mildly dilated RV with mild to moderately decreased RV systolic function.  CPX has suggested a more significant functional limitation than he reports subjectively.  He has a Secondary school teachert Jude ICD.  Probably NYHA class III symptoms.  He is quite volume overloaded on exam today with weight up.   - Continue current Coreg 25 mg twice a day .  - Continue Bidil 20-37.5 mg tid.   - Continue entresto 49-51 mg BID. - Add spironolactone 12.5 mg daily.  - Increase torsemide to 40 mg daily alternating with 60 mg daily.  BMET/BNP today and repeat in 1 week.  - RBBB, so not good candidate for CRT upgrade.  - I am concerned about his degree of volume overload as well as ongoing low EF (15%) and poor CPX in 10/16.  His creatinine is elevated suggestive of cardiorenal syndrome.  He may need an LVAD in the near future with possible transplant down the road (but will need weight loss for transplant).  I am going to evaluate him with RHC next week on higher torsemide.  I am going to ask our LVAD nurse coordinator to meet with him.  If low output on RHC, will need to start working towards LVAD.  2. Gout: Severe tophaceous gout.  - On Uloric.  Seeing Dr Dierdre ForthBeekman now.  3. CKD stage III: May be related to HTN, diabetes, hyperuricemia.  However, cardiorenal syndrome is likely playing a role as well.  - BMET today.   4. CAD: s/p CABG.  No chest pain.  Continue ASA 81 and atorvastatin.  Lipids acceptable in 10/16.  If we move towards  LVAD, he will need coronary angiography.  Will need to be careful with contrast given CKD.   Followup in 2 wks after RHC.   Marca AnconaDalton Ritesh Opara 07/10/2016

## 2016-07-09 NOTE — Progress Notes (Signed)
Initial Encounter with LVAD Team and MCS Introduction:  James [name] is a 55 y.o. male whom  has a past medical history of AICD (automatic cardioverter/defibrillator) present (09/2015); Chronic edema; CMI (chronic mesenteric ischemia) (HCC); Coronary artery disease; Gout; Gout; Hypertension; Ischemic cardiomyopathy; Myocardial infarction (HCC); Obesity; and Type II diabetes mellitus (HCC).. We have been asked to evaluate the patient for advanced therapies which include Left Ventricular Assist Device implantation.    VAD educational handout reviewed with patient and websites given for additional references.    Explained that LVAD can be implanted for two indications in the setting of advanced left ventricular heart failure treatment:  Bridge to transplant - used for patients who cannot safely wait for heart transplant without this device.  Or   Destination therapy - used for patients until end of life or recovery of heart function.   Provided brief equipment overview of the HeartMate II pump and discussed placement, surgical procedure, peripheral equipment, life-long coumadin therapy, importance of medication adherence and clinic follow up for as long as patient is living on support, life-style modifications, as well as need for caregiver to be successful with this therapy.   The patient identified his wife as primary caregiver, she is unavailable at visit today. States she works full time as Financial risk analystschool principal, but does have FMLA options.  We discussed the process of the evaluation period and how [facility] whether he would be an appropriate candidate for therapy or not. Pt wants to discuss with wife and [facility] before determining if evaluation process is appropriate for him.  Session Time: 15 minutes  Hessie DienerMolly Syna Gad, RN VAD Coordinator  Office: [phone] 24/7 VAD Pager: [phone]

## 2016-07-10 ENCOUNTER — Telehealth (HOSPITAL_COMMUNITY): Payer: Self-pay | Admitting: Vascular Surgery

## 2016-07-10 NOTE — Addendum Note (Signed)
Encounter addended by: Laurey Moralealton S Erman Thum, MD on: 07/10/2016 12:11 AM<BR>    Actions taken: Sign clinical note

## 2016-07-10 NOTE — Telephone Encounter (Signed)
Cath resch to Mon 9/25 at 11 am, pt aware

## 2016-07-10 NOTE — Telephone Encounter (Signed)
Pt called he would like to change cath procedure to Monday 07/15/16, he was told to call back to see if Monday worked better for him and wife ..please advise

## 2016-07-11 ENCOUNTER — Other Ambulatory Visit (HOSPITAL_COMMUNITY): Payer: Self-pay | Admitting: *Deleted

## 2016-07-11 ENCOUNTER — Ambulatory Visit (INDEPENDENT_AMBULATORY_CARE_PROVIDER_SITE_OTHER): Payer: Medicare PPO | Admitting: *Deleted

## 2016-07-11 DIAGNOSIS — I429 Cardiomyopathy, unspecified: Secondary | ICD-10-CM | POA: Diagnosis not present

## 2016-07-11 DIAGNOSIS — I5022 Chronic systolic (congestive) heart failure: Secondary | ICD-10-CM

## 2016-07-11 NOTE — Progress Notes (Signed)
Remote ICD transmission.   

## 2016-07-12 ENCOUNTER — Encounter: Payer: Self-pay | Admitting: Cardiology

## 2016-07-15 ENCOUNTER — Ambulatory Visit (HOSPITAL_COMMUNITY)
Admission: RE | Admit: 2016-07-15 | Discharge: 2016-07-15 | Disposition: A | Discharge status: Home / Self Care | Payer: Medicare PPO | Source: Ambulatory Visit | Attending: Cardiology | Admitting: Cardiology

## 2016-07-15 ENCOUNTER — Encounter (HOSPITAL_COMMUNITY)
Admission: RE | Disposition: A | Discharge status: Home / Self Care | Payer: Self-pay | Source: Ambulatory Visit | Attending: Cardiology

## 2016-07-15 ENCOUNTER — Encounter (HOSPITAL_COMMUNITY): Payer: Self-pay | Admitting: *Deleted

## 2016-07-15 DIAGNOSIS — I4510 Unspecified right bundle-branch block: Secondary | ICD-10-CM | POA: Insufficient documentation

## 2016-07-15 DIAGNOSIS — Z951 Presence of aortocoronary bypass graft: Secondary | ICD-10-CM | POA: Diagnosis not present

## 2016-07-15 DIAGNOSIS — E785 Hyperlipidemia, unspecified: Secondary | ICD-10-CM | POA: Insufficient documentation

## 2016-07-15 DIAGNOSIS — Z95810 Presence of automatic (implantable) cardiac defibrillator: Secondary | ICD-10-CM | POA: Diagnosis not present

## 2016-07-15 DIAGNOSIS — Z823 Family history of stroke: Secondary | ICD-10-CM | POA: Insufficient documentation

## 2016-07-15 DIAGNOSIS — I255 Ischemic cardiomyopathy: Secondary | ICD-10-CM | POA: Diagnosis not present

## 2016-07-15 DIAGNOSIS — N183 Chronic kidney disease, stage 3 (moderate): Secondary | ICD-10-CM | POA: Insufficient documentation

## 2016-07-15 DIAGNOSIS — Z7982 Long term (current) use of aspirin: Secondary | ICD-10-CM | POA: Diagnosis not present

## 2016-07-15 DIAGNOSIS — E1122 Type 2 diabetes mellitus with diabetic chronic kidney disease: Secondary | ICD-10-CM | POA: Diagnosis not present

## 2016-07-15 DIAGNOSIS — I130 Hypertensive heart and chronic kidney disease with heart failure and stage 1 through stage 4 chronic kidney disease, or unspecified chronic kidney disease: Secondary | ICD-10-CM | POA: Insufficient documentation

## 2016-07-15 DIAGNOSIS — I5022 Chronic systolic (congestive) heart failure: Secondary | ICD-10-CM | POA: Insufficient documentation

## 2016-07-15 DIAGNOSIS — M109 Gout, unspecified: Secondary | ICD-10-CM | POA: Insufficient documentation

## 2016-07-15 DIAGNOSIS — Z8249 Family history of ischemic heart disease and other diseases of the circulatory system: Secondary | ICD-10-CM | POA: Diagnosis not present

## 2016-07-15 DIAGNOSIS — I509 Heart failure, unspecified: Secondary | ICD-10-CM | POA: Diagnosis not present

## 2016-07-15 DIAGNOSIS — I272 Other secondary pulmonary hypertension: Secondary | ICD-10-CM | POA: Insufficient documentation

## 2016-07-15 DIAGNOSIS — I2510 Atherosclerotic heart disease of native coronary artery without angina pectoris: Secondary | ICD-10-CM | POA: Insufficient documentation

## 2016-07-15 HISTORY — PX: CARDIAC CATHETERIZATION: SHX172

## 2016-07-15 LAB — GLUCOSE, CAPILLARY
GLUCOSE-CAPILLARY: 105 mg/dL — AB (ref 65–99)
Glucose-Capillary: 99 mg/dL (ref 65–99)

## 2016-07-15 LAB — PROTIME-INR
INR: 1.27
Prothrombin Time: 16 seconds — ABNORMAL HIGH (ref 11.4–15.2)

## 2016-07-15 LAB — POCT I-STAT 3, VENOUS BLOOD GAS (G3P V)
ACID-BASE DEFICIT: 2 mmol/L (ref 0.0–2.0)
Acid-base deficit: 3 mmol/L — ABNORMAL HIGH (ref 0.0–2.0)
BICARBONATE: 23.7 mmol/L (ref 20.0–28.0)
Bicarbonate: 22.4 mmol/L (ref 20.0–28.0)
O2 Saturation: 58 %
O2 Saturation: 59 %
PCO2 VEN: 40.8 mmHg — AB (ref 44.0–60.0)
PH VEN: 7.348 (ref 7.250–7.430)
PH VEN: 7.351 (ref 7.250–7.430)
TCO2: 24 mmol/L (ref 0–100)
TCO2: 25 mmol/L (ref 0–100)
pCO2, Ven: 42.7 mmHg — ABNORMAL LOW (ref 44.0–60.0)
pO2, Ven: 32 mmHg (ref 32.0–45.0)
pO2, Ven: 33 mmHg (ref 32.0–45.0)

## 2016-07-15 LAB — POCT I-STAT 3, ART BLOOD GAS (G3+)
ACID-BASE DEFICIT: 2 mmol/L (ref 0.0–2.0)
Bicarbonate: 23.1 mmol/L (ref 20.0–28.0)
O2 Saturation: 99 %
PH ART: 7.394 (ref 7.350–7.450)
PO2 ART: 130 mmHg — AB (ref 83.0–108.0)
TCO2: 24 mmol/L (ref 0–100)
pCO2 arterial: 37.8 mmHg (ref 32.0–48.0)

## 2016-07-15 LAB — BASIC METABOLIC PANEL
Anion gap: 9 (ref 5–15)
BUN: 34 mg/dL — AB (ref 6–20)
CALCIUM: 8.9 mg/dL (ref 8.9–10.3)
CHLORIDE: 107 mmol/L (ref 101–111)
CO2: 23 mmol/L (ref 22–32)
CREATININE: 1.75 mg/dL — AB (ref 0.61–1.24)
GFR calc Af Amer: 50 mL/min — ABNORMAL LOW (ref >60)
GFR calc non Af Amer: 43 mL/min — ABNORMAL LOW (ref >60)
Glucose, Bld: 96 mg/dL (ref 65–99)
Potassium: 4 mmol/L (ref 3.5–5.1)
Sodium: 139 mmol/L (ref 135–145)

## 2016-07-15 LAB — CBC
HCT: 34.3 % — ABNORMAL LOW (ref 39.0–52.0)
Hemoglobin: 10.9 g/dL — ABNORMAL LOW (ref 13.0–17.0)
MCH: 29.7 pg (ref 26.0–34.0)
MCHC: 31.8 g/dL (ref 30.0–36.0)
MCV: 93.5 fL (ref 78.0–100.0)
PLATELETS: 143 10*3/uL — AB (ref 150–400)
RBC: 3.67 MIL/uL — AB (ref 4.22–5.81)
RDW: 15.8 % — AB (ref 11.5–15.5)
WBC: 3.5 10*3/uL — ABNORMAL LOW (ref 4.0–10.5)

## 2016-07-15 SURGERY — RIGHT HEART CATH

## 2016-07-15 MED ORDER — ONDANSETRON HCL 4 MG/2ML IJ SOLN: 4.0000 mg | Freq: Four times a day (QID) | INTRAMUSCULAR | Status: DC | PRN

## 2016-07-15 MED ORDER — SODIUM CHLORIDE 0.9% FLUSH: 3.0000 mL | Freq: Two times a day (BID) | INTRAVENOUS | Status: DC

## 2016-07-15 MED ORDER — SODIUM CHLORIDE 0.9 % IV SOLN: 250.0000 mL | INTRAVENOUS | Status: DC | PRN

## 2016-07-15 MED ORDER — LIDOCAINE HCL (PF) 1 % IJ SOLN
INTRAMUSCULAR | Status: DC | PRN
  Administered 2016-07-15: 23 mL via INTRADERMAL

## 2016-07-15 MED ORDER — MIDAZOLAM HCL 2 MG/2ML IJ SOLN
INTRAMUSCULAR | Status: AC
  Filled 2016-07-15: qty 2

## 2016-07-15 MED ORDER — MIDAZOLAM HCL 2 MG/2ML IJ SOLN
INTRAMUSCULAR | Status: DC | PRN
  Administered 2016-07-15: 1 mg via INTRAVENOUS

## 2016-07-15 MED ORDER — HEPARIN (PORCINE) IN NACL 2-0.9 UNIT/ML-% IJ SOLN
INTRAMUSCULAR | Status: AC
  Filled 2016-07-15: qty 1000

## 2016-07-15 MED ORDER — FENTANYL CITRATE (PF) 100 MCG/2ML IJ SOLN
INTRAMUSCULAR | Status: DC | PRN
  Administered 2016-07-15 (×2): 25 ug via INTRAVENOUS

## 2016-07-15 MED ORDER — HEPARIN (PORCINE) IN NACL 2-0.9 UNIT/ML-% IJ SOLN
INTRAMUSCULAR | Status: DC | PRN
  Administered 2016-07-15: 1000 mL

## 2016-07-15 MED ORDER — ASPIRIN 81 MG PO CHEW: 81.0000 mg | CHEWABLE_TABLET | ORAL | Status: DC

## 2016-07-15 MED ORDER — SODIUM CHLORIDE 0.9% FLUSH: 3.0000 mL | INTRAVENOUS | Status: DC | PRN

## 2016-07-15 MED ORDER — ACETAMINOPHEN 325 MG PO TABS: 650.0000 mg | ORAL_TABLET | ORAL | Status: DC | PRN

## 2016-07-15 MED ORDER — SODIUM CHLORIDE 0.9 % IV SOLN
INTRAVENOUS | Status: DC
  Administered 2016-07-15: 11:00:00 via INTRAVENOUS

## 2016-07-15 MED ORDER — FENTANYL CITRATE (PF) 100 MCG/2ML IJ SOLN
INTRAMUSCULAR | Status: AC
  Filled 2016-07-15: qty 2

## 2016-07-15 MED ORDER — LIDOCAINE HCL (PF) 1 % IJ SOLN
INTRAMUSCULAR | Status: AC
  Filled 2016-07-15: qty 30

## 2016-07-15 SURGICAL SUPPLY — 9 items
CATH SWAN GANZ 7F STRAIGHT (CATHETERS) ×3 IMPLANT
KIT HEART LEFT (KITS) ×3 IMPLANT
PACK CARDIAC CATHETERIZATION (CUSTOM PROCEDURE TRAY) ×3 IMPLANT
PROTECTION STATION PRESSURIZED (MISCELLANEOUS) ×3
SHEATH PINNACLE 7F 10CM (SHEATH) ×3 IMPLANT
STATION PROTECTION PRESSURIZED (MISCELLANEOUS) ×1 IMPLANT
TRANSDUCER W/STOPCOCK (MISCELLANEOUS) ×6 IMPLANT
TUBING ART PRESS 72  MALE/FEM (TUBING) ×2
TUBING ART PRESS 72 MALE/FEM (TUBING) ×1 IMPLANT

## 2016-07-15 NOTE — Interval H&P Note (Signed)
History and Physical Interval Note:  07/15/2016 11:29 AM  James [name]  has presented today for surgery, with the diagnosis of hf  The various methods of treatment have been discussed with the patient and family. After consideration of risks, benefits and other options for treatment, the patient has consented to  Procedure(s): Right Heart Cath (N/A) as a surgical intervention .  The patient's history has been reviewed, patient examined, no change in status, stable for surgery.  I have reviewed the patient's chart and labs.  Questions were answered to the patient's satisfaction.     Larken Urias Chesapeake EnergyMcLean

## 2016-07-15 NOTE — Progress Notes (Signed)
Site area: RFV Site Prior to Removal:  Level 0 Pressure Applied For:520min Manual: yes   Patient Status During Pull:  stable Post Pull Site:  Level 0 Post Pull Instructions Given:yes   Post Pull Pulses Present:  Dressing Applied:  tegaderm Bedrest begins @  Comments:

## 2016-07-15 NOTE — Discharge Instructions (Signed)
Groin Surgical Site Care °Refer to this sheet in the next few weeks. These instructions provide you with information about caring for yourself after your procedure. Your health care provider may also give you more specific instructions. Your treatment has been planned according to current medical practices, but problems sometimes occur. Call your health care provider if you have any problems or questions after your procedure. °WHAT TO EXPECT AFTER THE PROCEDURE °After your procedure, it is typical to have the following: °· Bruising at the groin site that usually fades within 1-2 weeks. °· Blood collecting in the tissue (hematoma) that may be painful to the touch. It should usually decrease in size and tenderness within 1-2 weeks. °HOME CARE INSTRUCTIONS °· Take medicines only as directed by your health care provider. °· You may shower 24-48 hours after the procedure or as directed by your health care provider. Remove the bandage (dressing) and gently wash the site with plain soap and water. Pat the area dry with a clean towel. Do not rub the site, because this may cause bleeding. °· Do not take baths, swim, or use a hot tub until your health care provider approves. °· Check your insertion site every day for redness, swelling, or drainage. °· Do not apply powder or lotion to the site. °· Limit use of stairs to twice a day for the first 2-3 days or as directed by your health care provider. °· Do not squat for the first 2-3 days or as directed by your health care provider. °· Do not lift over 10 lb (4.5 kg) for 5 days after your procedure or as directed by your health care provider. °· Ask your health care provider when it is okay to: °¨ Return to work or school. °¨ Resume usual physical activities or sports. °¨ Resume sexual activity. °· Do not drive home if you are discharged the same day as the procedure. Have someone else drive you. °· You may drive 24 hours after the procedure unless otherwise instructed by your  health care provider. °· Do not operate machinery or power tools for 24 hours after the procedure or as directed by your health care provider. °· If your procedure was done as an outpatient procedure, which means that you went home the same day as your procedure, a responsible adult should be with you for the first 24 hours after you arrive home. °· Keep all follow-up visits as directed by your health care provider. This is important. °SEEK MEDICAL CARE IF: °· You have a fever. °· You have chills. °· You have increased bleeding from the groin site. Hold pressure on the site. °SEEK IMMEDIATE MEDICAL CARE IF: °· You have unusual pain at the groin site. °· You have redness, warmth, or swelling at the groin site. °· You have drainage (other than a small amount of blood on the dressing) from the groin site. °· The groin site is bleeding, and the bleeding does not stop after 30 minutes of holding steady pressure on the site. °· Your leg or foot becomes pale, cool, tingly, or numb. °  °This information is not intended to replace advice given to you by your health care provider. Make sure you discuss any questions you have with your health care provider. °  °Document Released: 06/10/2014 Document Reviewed: 06/10/2014 °Elsevier Interactive Patient Education ©2016 [employer] ° °

## 2016-07-15 NOTE — Progress Notes (Signed)
Site area:RFV  Site Prior to Removal:  Level 0 Pressure Applied For:3420min Manual: yes   Patient Status During Pull: stable  Post Pull Site:  Level 0 Post Pull Instructions Given:  yes Post Pull Pulses Present: palpable Dressing Applied:  tegaderm Bedrest begins @ 1250 till 1550 Comments:

## 2016-07-15 NOTE — H&P (View-Only) (Signed)
Patient ID: BRANDYN LOWREY, male   DOB: Jun 28, 1961, 55 y.o.   MRN: [id number]     Advanced Heart Failure Clinic Note   PCP: Dr. Michel Santee Rheumatology: Dr. Kellie Simmering -> Now Dr. Dierdre Forth Cardiology: Dr. Antoine Poche HF Cardiology: Dr. Shirlee Latch  55 yo with history of gout, DM2, HTN, CKD, CAD s/p CABG, and chronic systolic CHF presents for CHF clinic evaluation.  He had CABG x 6 in 2/12.  He has a long-standing ischemic cardiomyopathy, echo in 10/16 with EF 20-25%.  He also has severe tophaceous gout.     Now s/p St Jude ICD 10/06/15.  Symptomatically, says he is doing ok.  Gout pain improved.  Weight is up 7 lbs.  He can walk 6-7 laps slowly at the [facility].  No orthopnea/PND.  Dyspnea with incline/hill/walking fast.  No chest pain.    Labs (8/16): HCT 38.9, K 4.9, creatinine 1.81 Labs (08/01/2015) : K 3.7 Creatinine 1.49, BNP 658, LDL 85, HDL 65 Labs (09/18/2015): K 5.1 Creatinine 1.5 Labs (02/23/2016) K 4.6, Creatinine 1.58 Labs (03/01/2016): K 3.8, Creatinine 1.53 Labs (7/17): K 4.3, creatinine 1.53  PMH: 1. Tophaceous gout: Sees Dr Dierdre Forth.  2. Hyperlipidemia 3. CKD 4. Type II diabetes 5. CAD: s/p CABG 2/12 with sequential LIMA-mid and distal LAD, sequential SVG to OM1/2/3, SVG-dRCA.   6. Chronic systolic CHF: Ischemic cardiomyopathy. Echo 2013 with EF 25%.  Echo (10/16) with EF 15-20%, restrictive diastolic function, moderately dilated RV with flattened interventricular septum, mild MR.   - CPX (10/16) with peak VO2 11.5, VE/VCO2 slope 33.6, RER 1.19 => moderate to severe HF limitation.  - Echo (9/17): EF 15% with mild LV dilation, mild RV dilation with mild to moderately decreased systolic function, moderate MR, PASP 69 mmHg, IVC dilated.  - St Jude ICD.   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    FH: HTN.  Father with CVA.   ROS: All systems reviewed and negative except as per HPI.   Current Outpatient Prescriptions  Medication Sig Dispense Refill  . aspirin EC 81 MG  tablet Take 81 mg by mouth daily.    Marland Kitchen atorvastatin (LIPITOR) 80 MG tablet TAKE ONE TABLET BY MOUTH DAILY 90 tablet 3  . BIDIL 20-37.5 MG tablet TAKE 1 TABLET BY MOUTH 3 (THREE) TIMES DAILY. 90 tablet 6  . carvedilol (COREG) 25 MG tablet Take 1 tablet (25 mg total) by mouth 2 (two) times daily with a meal. Please schedule appointment for refills. 360 tablet 0  . colchicine 0.6 MG tablet Take 0.6 mg by mouth daily as needed (gout). Reported on 02/21/2016    . glipiZIDE (GLUCOTROL) 5 MG tablet TAKE 1 TABLET (5 MG TOTAL) BY MOUTH 2 (TWO) TIMES DAILY BEFORE MEALS (REFER FUTURE REFILLS TO PRIMARY CARE PHYSICIAN) 180 tablet 0  . Misc Natural Products (TART CHERRY ADVANCED PO) Take 1 tablet by mouth daily as needed. Reported on 02/21/2016    . sacubitril-valsartan (ENTRESTO) 49-51 MG Take 1 tablet by mouth 2 (two) times daily. 60 tablet 6  . torsemide (DEMADEX) 20 MG tablet Take 2 tab every other day ALTERNATING with 3 tabs every other day 75 tablet 2  . ULORIC 80 MG TABS Take 1 tablet by mouth daily.  2  . nitroGLYCERIN (NITROSTAT) 0.4 MG SL tablet Place 1 tablet (0.4 mg total) under the tongue every 5 (five) minutes as needed for chest pain. (Patient not taking: Reported on 07/09/2016) 25 tablet 2  . spironolactone (ALDACTONE) 25 MG tablet Take 0.5  tablets (12.5 mg total) by mouth daily. 45 tablet 3   No current facility-administered medications for this encounter.    BP 122/82   Pulse 68   Wt 246 lb 8 oz (111.8 kg)   SpO2 100%   BMI 33.43 kg/m    Wt Readings from Last 3 Encounters:  07/09/16 246 lb 8 oz (111.8 kg)  05/14/16 239 lb (108.4 kg)  02/21/16 246 lb 12.8 oz (111.9 kg)     General: NAD Neck: JVP 12 cm, no thyromegaly or thyroid nodule. Lungs: CTAB, normal effort CV: Nondisplaced PMI.  Heart regular S1/S2, no S3/S4, no murmur.  No carotid bruit.  Normal pedal pulses.  Abdomen: Soft, non-tender, non-distended, no HSM. No bruits or masses. +BS  Skin: Intact without lesions or  rashes Neurologic: Alert and oriented x 3.  Psych: Normal affect. Extremities: No clubbing or cyanosis. 2+ edema to knees bilaterally. Prominent tophi noted on fingers and right calf.  HEENT: Normal.   Assessment/Plan: 1. Chronic systolic CHF: Ischemic cardiomyopathy.  EF 25% in 2013, EF 20-25% on 10/16 echo with evidence for RV volume overload.  He has seemed to be stable symptomatically over several years but LV systolic function has not improved.  Most recent echo was done today and reviewed: EF 15%, mildly dilated RV with mild to moderately decreased RV systolic function.  CPX has suggested a more significant functional limitation than he reports subjectively.  He has a Secondary school teachert Jude ICD.  Probably NYHA class III symptoms.  He is quite volume overloaded on exam today with weight up.   - Continue current Coreg 25 mg twice a day .  - Continue Bidil 20-37.5 mg tid.   - Continue entresto 49-51 mg BID. - Add spironolactone 12.5 mg daily.  - Increase torsemide to 40 mg daily alternating with 60 mg daily.  BMET/BNP today and repeat in 1 week.  - RBBB, so not good candidate for CRT upgrade.  - I am concerned about his degree of volume overload as well as ongoing low EF (15%) and poor CPX in 10/16.  His creatinine is elevated suggestive of cardiorenal syndrome.  He may need an LVAD in the near future with possible transplant down the road (but will need weight loss for transplant).  I am going to evaluate him with RHC next week on higher torsemide.  I am going to ask our LVAD nurse coordinator to meet with him.  If low output on RHC, will need to start working towards LVAD.  2. Gout: Severe tophaceous gout.  - On Uloric.  Seeing Dr Dierdre ForthBeekman now.  3. CKD stage III: May be related to HTN, diabetes, hyperuricemia.  However, cardiorenal syndrome is likely playing a role as well.  - BMET today.   4. CAD: s/p CABG.  No chest pain.  Continue ASA 81 and atorvastatin.  Lipids acceptable in 10/16.  If we move towards  LVAD, he will need coronary angiography.  Will need to be careful with contrast given CKD.   Followup in 2 wks after RHC.   Marca AnconaDalton Auset Fritzler 07/10/2016

## 2016-07-16 ENCOUNTER — Encounter (HOSPITAL_COMMUNITY): Payer: Self-pay | Admitting: Cardiology

## 2016-07-22 ENCOUNTER — Telehealth: Payer: Self-pay | Admitting: Cardiology

## 2016-07-22 NOTE — Telephone Encounter (Signed)
Spoke w/ pt and informed him that he still needed to follow up with Dr. Elberta Fortisamnitz even after his follow up w/ Dr. Jearld PiesMcClean b/c they follow two different parts of his heart. Pt verbalized understanding and agreed to an appt w/ MD on 10-08-16 at 10:45 AM.

## 2016-07-22 NOTE — Telephone Encounter (Signed)
James [name] is calling because he had a defib check about two weeks ago and wants to know should he have another one . Please call   Thanks

## 2016-07-23 ENCOUNTER — Encounter (HOSPITAL_COMMUNITY): Payer: Self-pay

## 2016-07-23 ENCOUNTER — Ambulatory Visit (HOSPITAL_COMMUNITY)
Admission: RE | Admit: 2016-07-23 | Discharge: 2016-07-23 | Disposition: A | Discharge status: Home / Self Care | Payer: Medicare PPO | Source: Ambulatory Visit | Attending: Cardiology | Admitting: Cardiology

## 2016-07-23 ENCOUNTER — Encounter: Payer: Self-pay | Admitting: Internal Medicine

## 2016-07-23 VITALS — BP 122/84 | HR 71 | Wt 234.5 lb

## 2016-07-23 DIAGNOSIS — I2510 Atherosclerotic heart disease of native coronary artery without angina pectoris: Secondary | ICD-10-CM | POA: Insufficient documentation

## 2016-07-23 DIAGNOSIS — I5022 Chronic systolic (congestive) heart failure: Secondary | ICD-10-CM | POA: Diagnosis present

## 2016-07-23 DIAGNOSIS — I4510 Unspecified right bundle-branch block: Secondary | ICD-10-CM | POA: Diagnosis not present

## 2016-07-23 DIAGNOSIS — I130 Hypertensive heart and chronic kidney disease with heart failure and stage 1 through stage 4 chronic kidney disease, or unspecified chronic kidney disease: Secondary | ICD-10-CM | POA: Insufficient documentation

## 2016-07-23 DIAGNOSIS — Z951 Presence of aortocoronary bypass graft: Secondary | ICD-10-CM | POA: Insufficient documentation

## 2016-07-23 DIAGNOSIS — Z95810 Presence of automatic (implantable) cardiac defibrillator: Secondary | ICD-10-CM | POA: Insufficient documentation

## 2016-07-23 DIAGNOSIS — E1122 Type 2 diabetes mellitus with diabetic chronic kidney disease: Secondary | ICD-10-CM | POA: Insufficient documentation

## 2016-07-23 DIAGNOSIS — N183 Chronic kidney disease, stage 3 unspecified: Secondary | ICD-10-CM

## 2016-07-23 DIAGNOSIS — I255 Ischemic cardiomyopathy: Secondary | ICD-10-CM | POA: Diagnosis not present

## 2016-07-23 DIAGNOSIS — E785 Hyperlipidemia, unspecified: Secondary | ICD-10-CM | POA: Insufficient documentation

## 2016-07-23 DIAGNOSIS — Z7982 Long term (current) use of aspirin: Secondary | ICD-10-CM | POA: Insufficient documentation

## 2016-07-23 DIAGNOSIS — Z823 Family history of stroke: Secondary | ICD-10-CM | POA: Insufficient documentation

## 2016-07-23 DIAGNOSIS — Z8249 Family history of ischemic heart disease and other diseases of the circulatory system: Secondary | ICD-10-CM | POA: Insufficient documentation

## 2016-07-23 DIAGNOSIS — M109 Gout, unspecified: Secondary | ICD-10-CM | POA: Insufficient documentation

## 2016-07-23 LAB — BASIC METABOLIC PANEL
ANION GAP: 9 (ref 5–15)
BUN: 33 mg/dL — AB (ref 6–20)
CALCIUM: 8.9 mg/dL (ref 8.9–10.3)
CO2: 23 mmol/L (ref 22–32)
CREATININE: 1.65 mg/dL — AB (ref 0.61–1.24)
Chloride: 107 mmol/L (ref 101–111)
GFR calc Af Amer: 53 mL/min — ABNORMAL LOW (ref >60)
GFR, EST NON AFRICAN AMERICAN: 46 mL/min — AB (ref >60)
GLUCOSE: 95 mg/dL (ref 65–99)
Potassium: 3.8 mmol/L (ref 3.5–5.1)
Sodium: 139 mmol/L (ref 135–145)

## 2016-07-23 LAB — DIGOXIN LEVEL: Digoxin Level: 0.9 ng/mL (ref 0.8–2.0)

## 2016-07-23 MED ORDER — TORSEMIDE 20 MG PO TABS: 80.0000 mg | ORAL_TABLET | Freq: Every day | ORAL | 3 refills | Status: DC

## 2016-07-23 MED ORDER — SPIRONOLACTONE 25 MG PO TABS: 25.0000 mg | ORAL_TABLET | Freq: Every day | ORAL | 3 refills | Status: DC

## 2016-07-23 NOTE — Progress Notes (Signed)
Advanced Heart Failure Medication Review by a Pharmacist  Does the patient  feel that his/her medications are working for him/her?  yes  Has the patient been experiencing any side effects to the medications prescribed?  no  Does the patient measure his/her own blood pressure or blood glucose at home?  yes   Does the patient have any problems obtaining medications due to transportation or finances?   no  Understanding of regimen: good Understanding of indications: good Potential of compliance: good Patient understands to avoid NSAIDs. Patient understands to avoid decongestants.  Issues to address at subsequent visits: None   Pharmacist comments:  [name] is a pleasant 55 yo M presenting without a medication list but with good recall of his regimen. He reports good compliance with his regimen and has been taking an increased dose of torsemide 60 mg daily instead of 60 mg alternating with 40 mg since he was told to do so last week. He also mentioned that when he takes Bidil he has a slight burning in his mouth but no swelling or difficulty breathing. This may be due to the dye and I advised him to let us know if it gets any worse or if other symptoms are present. He did not have any other medication-related questions or concerns for me at this time.   Tyler DeisErika K. Bonnye FavaNicolsen, PharmD, BCPS, CPP Clinical Pharmacist Pager: [phone] Phone: [phone] 07/23/2016 10:53 AM      Time with patient: 10 minutes Preparation and documentation time: 2 minutes Total time: 12 minutes

## 2016-07-23 NOTE — Patient Instructions (Signed)
Increase Spironolactone to 25 mg (1 tab) daily  Increase Torsemide to 80 mg (4 tabs) daily  Labs today  Labs in 10 days  You have been referred to Dr Laneta SimmersBartle  Your physician has recommended that you have a cardiopulmonary stress test (CPX). CPX testing is a non-invasive measurement of heart and lung function. It replaces a traditional treadmill stress test. This type of test provides a tremendous amount of information that relates not only to your present condition but also for future outcomes. This test combines measurements of you ventilation, respiratory gas exchange in the lungs, electrocardiogram (EKG), blood pressure and physical response before, during, and following an exercise protocol.  Your physician recommends that you schedule a follow-up appointment in: 3-4 weeks

## 2016-07-23 NOTE — Progress Notes (Signed)
Patient ID: James [name], male   DOB: 14-Jun-1961, 55 y.o.   MRN: [id number]     Advanced Heart Failure Clinic Note   PCP: Dr. Michel Santee Rheumatology: Dr. Kellie Simmering -> Now Dr. Dierdre Forth Cardiology: Dr. Antoine Poche HF Cardiology: Dr. Shirlee Latch  55 yo with history of gout, DM2, HTN, CKD, CAD s/p CABG, and chronic systolic CHF presents for CHF clinic evaluation.  He had CABG x 6 in 2/12.  He has a long-standing ischemic cardiomyopathy, echo in 10/16 with EF 20-25%.  He also has severe tophaceous gout.     Now s/p St Jude ICD 10/06/15.  9/17 echo with EF 15%, mild to moderately decreased RV systolic function.   At last appointment, he was very volume overloaded.  Torsemide was increased and RHC was done, showing elevated filling pressures and marginal cardiac output.  Digoxin was started and torsemide was increased again.  Today, weight is down 12 lbs.  He is breathing better.  If he walks slowly, he is not short of breath.  He can climb a flight of stairs slowly.  Dyspnea with rushing.  No chest pain. No orthopnea/PND.   Labs (8/16): HCT 38.9, K 4.9, creatinine 1.81 Labs (08/01/2015) : K 3.7 Creatinine 1.49, BNP 658, LDL 85, HDL 65 Labs (09/18/2015): K 5.1 Creatinine 1.5 Labs (02/23/2016) K 4.6, Creatinine 1.58 Labs (03/01/2016): K 3.8, Creatinine 1.53 Labs (7/17): K 4.3, creatinine 1.53 Labs (9/17): K 4, creatinine 1.75, hgb 10.9  PMH: 1. Tophaceous gout: Sees Dr Dierdre Forth.  2. Hyperlipidemia 3. CKD 4. Type II diabetes 5. CAD: s/p CABG 2/12 with sequential LIMA-mid and distal LAD, sequential SVG to OM1/2/3, SVG-dRCA.   6. Chronic systolic CHF: Ischemic cardiomyopathy. Echo 2013 with EF 25%.  Echo (10/16) with EF 15-20%, restrictive diastolic function, moderately dilated RV with flattened interventricular septum, mild MR.   - CPX (10/16) with peak VO2 11.5, VE/VCO2 slope 33.6, RER 1.19 => moderate to severe HF limitation.  - Echo (9/17): EF 15% with mild LV dilation, mild RV dilation with mild to  moderately decreased systolic function, moderate MR, PASP 69 mmHg, IVC dilated.  - St Jude ICD.  - Echo (9/17) with EF 15%, mild LV dilation, diffuse hypokinesis, mildly dilated RV with mild to moderately decreased systolic function, PASP 69 mmHg.  - RHC (9/17) with mean RA 14, PA 70/35 mean 49, mean PCWP 24, RA/PCWP 0.5, CI 2.14 Fick/2.17 thermo, PVR 5.   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    FH: HTN.  Father with CVA.   ROS: All systems reviewed and negative except as per HPI.   Current Outpatient Prescriptions  Medication Sig Dispense Refill  . aspirin EC 81 MG tablet Take 81 mg by mouth daily.    Marland Kitchen atorvastatin (LIPITOR) 80 MG tablet TAKE ONE TABLET BY MOUTH DAILY 90 tablet 3  . BIDIL 20-37.5 MG tablet TAKE 1 TABLET BY MOUTH 3 (THREE) TIMES DAILY. 90 tablet 6  . carvedilol (COREG) 25 MG tablet Take 1 tablet (25 mg total) by mouth 2 (two) times daily with a meal. Please schedule appointment for refills. 360 tablet 0  . febuxostat (ULORIC) 40 MG tablet Take 120 mg by mouth daily.    Marland Kitchen glipiZIDE (GLUCOTROL) 5 MG tablet TAKE 1 TABLET (5 MG TOTAL) BY MOUTH 2 (TWO) TIMES DAILY BEFORE MEALS (REFER FUTURE REFILLS TO PRIMARY CARE PHYSICIAN) 180 tablet 0  . Misc Natural Products (TART CHERRY ADVANCED PO) Take 1 tablet by mouth daily. Reported on 02/21/2016    .  sacubitril-valsartan (ENTRESTO) 49-51 MG Take 1 tablet by mouth 2 (two) times daily. 60 tablet 6  . spironolactone (ALDACTONE) 25 MG tablet Take 1 tablet (25 mg total) by mouth daily. 90 tablet 3  . torsemide (DEMADEX) 20 MG tablet Take 4 tablets (80 mg total) by mouth daily. 120 tablet 3  . colchicine 0.6 MG tablet Take 0.6 mg by mouth daily as needed (gout). Reported on 02/21/2016    . nitroGLYCERIN (NITROSTAT) 0.4 MG SL tablet Place 1 tablet (0.4 mg total) under the tongue every 5 (five) minutes as needed for chest pain. 25 tablet 2   No current facility-administered medications for this encounter.    BP  122/84   Pulse 71   Wt 234 lb 8 oz (106.4 kg)   SpO2 100%   BMI 31.80 kg/m    Wt Readings from Last 3 Encounters:  07/23/16 234 lb 8 oz (106.4 kg)  07/15/16 239 lb (108.4 kg)  07/09/16 246 lb 8 oz (111.8 kg)     General: NAD Neck: JVP 10-12 cm, no thyromegaly or thyroid nodule. Lungs: CTAB, normal effort CV: Nondisplaced PMI.  Heart regular S1/S2, no S3/S4, no murmur.  No carotid bruit.  Normal pedal pulses.  Abdomen: Soft, non-tender, non-distended, no HSM. No bruits or masses. +BS  Skin: Intact without lesions or rashes Neurologic: Alert and oriented x 3.  Psych: Normal affect. Extremities: No clubbing or cyanosis. 1+ edema to knees bilaterally. Prominent tophi noted on fingers and right calf.  HEENT: Normal.   Assessment/Plan: 1. Chronic systolic CHF: Ischemic cardiomyopathy.  EF 25% in 2013, EF 20-25% on 10/16 echo with evidence for RV volume overload.  Most recent echo was done in 9/17, showing EF 15%, mildly dilated RV with mild to moderately decreased RV systolic function.  Severe functional deficit by CPX last year.  He has a Secondary school teachert Jude ICD.  Despite diuresis/weight loss, he is still volume overloaded.  Probably NYHA class III. - Continue current Coreg 25 mg twice a day .  - Continue Bidil 20-37.5 mg tid.   - Continue entresto 49-51 mg BID. - Increase spironolactone to 25 mg daily.  - Continue digoxin 0.125 daily, check level.  - Increase torsemide to 80 mg daily with BMET in 10 days. - RBBB, so not good candidate for CRT upgrade.  - Repeat CPX.  - I am concerned about his degree of volume overload as well as ongoing low EF (15%) and poor CPX in 10/16.  His creatinine is elevated suggestive of cardiorenal syndrome.  He may need an LVAD in the near future with possible transplant down the road (but will need weight loss for transplant).  LVAD workup has begun, and he will be seeing Dr Laneta SimmersBartle.  As above, plan repeat CPX.  2. Gout: Severe tophaceous gout.  - On Uloric.  Seeing  Dr Dierdre ForthBeekman now.  3. CKD stage III: May be related to HTN, diabetes, hyperuricemia.  However, cardiorenal syndrome is likely playing a role as well.    4. CAD: s/p CABG.  No chest pain.  Continue ASA 81 and atorvastatin.  Lipids acceptable in 10/16.  If we move towards LVAD, he will need coronary angiography.  Would like to see creatinine better though.    Followup in 3-4 weeks.   Marca AnconaDalton McLean 07/23/2016

## 2016-07-30 ENCOUNTER — Institutional Professional Consult (permissible substitution) (INDEPENDENT_AMBULATORY_CARE_PROVIDER_SITE_OTHER): Payer: Medicare PPO | Admitting: Surgery

## 2016-07-30 ENCOUNTER — Encounter: Payer: Self-pay | Admitting: Surgery

## 2016-07-30 VITALS — BP 116/79 | HR 60 | Resp 16 | Ht 72.0 in | Wt 223.0 lb

## 2016-07-30 DIAGNOSIS — I5022 Chronic systolic (congestive) heart failure: Secondary | ICD-10-CM

## 2016-08-01 ENCOUNTER — Encounter: Payer: Self-pay | Admitting: Surgery

## 2016-08-01 NOTE — Progress Notes (Signed)
Cardiothoracic Surgery Consultation  PCP is Joaquin Courts, DO Referring Provider is Laurey Morale, MD  Chief Complaint  Patient presents with  . 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 He had an EF of 20-25% with grade 3 diastolic dysfunction when I operated on him and presented with progressive fluid retention and anasarca. He had a sequential LIMA to the mid and distal LAD, sequential SVG to OM1, OM2, and OM3, and a SVG to the distal RCA. He subsequently had a [employer] Jude ICD placed in 09/2015. He had a CPX in 07/2015 showing a peak VO2 of 11.5, VE/VCO2 slope of 33.6, RER 1.19 consistent with moderate to severe cardiac limitation. His most recent echo in September showed an EF of 15% with mild to moderate RV dysfunction. RHC in 06/2016 showed mean RA 14, PA 70/35 mean 49, mean PCWP 24, RA/PCWP 0.5, CI 2.14 Fick/2.17 thermo, PVR 5. When he was seen in September he was very volume overloaded and his Torsemide was increased. He has been diuresing well and has lost close to 20 lbs. He says that his breathing is much better and he is able to walk at his usual pace without shortness of breath. He is able to walk up the stairs in his house. He denies orthopnea and PND, chest pain or pressure. He has chronic edema in his legs but it is much better.  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 He lives with his wife who is a Engineer, materials at a school in [place], [place]. She could not be here today due to work.   Past Medical History:  Diagnosis Date  . AICD (automatic cardioverter/defibrillator) present 09/2015   [facility] VR model [id number] (serial Number [id number])  ICD   . Chronic edema   . CMI (chronic mesenteric ischemia) (HCC)   . Coronary artery disease   . Gout   . Gout   . Hypertension   . Ischemic cardiomyopathy    EF 25%  . Myocardial infarction    "they saw I'd had one; not sure when but it was before 2012"  . Obesity   . Type II diabetes mellitus (HCC)     Past Surgical History:  Procedure Laterality Date  . CARDIAC CATHETERIZATION  11/2010  . CARDIAC CATHETERIZATION N/A 07/15/2016   Procedure: Right Heart Cath;  Surgeon: Laurey Morale, MD;  Location: [facility] INVASIVE CV LAB;  Service: Cardiovascular;  Laterality: N/A;  . CORONARY ARTERY BYPASS GRAFT  11/2010   Sequential left internal mammary graft to the mid and distal LAD, sequential sapenous vein graft to the first, second, and third obutuse marginal branches  of the  left circumflex  coronary artery.  Saphenous vein graft to the distal right coronary artery.  . EP IMPLANTABLE DEVICE N/A 10/06/2015   Procedure: ICD Implant;  Surgeon: Will Jorja Loa, MD; [facility] VR model [id number] (serial Number [id number]) ICD implanted L chest   . SHOULDER OPEN ROTATOR CUFF REPAIR Right ~ 2000    Family History  Problem Relation Age of Onset  . Hypertension Mother   . Diabetes type II  Mother   . Hypertension Father   . Hypertension Sister   . Hypertension Paternal Grandmother   . Diabetes Other   . Hypertension Other     Social History Social History  Substance Use Topics  . Smoking status: Never Smoker  . Smokeless tobacco: Former Neurosurgeon    Types: Chew     Comment: "randomly chewed in my 20's"  . Alcohol use 0.0 oz/week     Comment: 10/06/2015 "might have a beer q couple weeks"    Current Outpatient Prescriptions  Medication Sig Dispense Refill  . aspirin EC 81 MG tablet Take 81 mg by mouth daily.    Marland Kitchen atorvastatin (LIPITOR) 80 MG tablet TAKE ONE TABLET BY MOUTH DAILY 90 tablet 3  . BIDIL 20-37.5 MG tablet TAKE 1 TABLET BY MOUTH 3 (THREE) TIMES DAILY. 90 tablet 6   . carvedilol (COREG) 25 MG tablet Take 1 tablet (25 mg total) by mouth 2 (two) times daily with a meal. Please schedule appointment for refills. 360 tablet 0  . colchicine 0.6 MG tablet Take 0.6 mg by mouth daily as needed (gout). Reported on 02/21/2016    . febuxostat (ULORIC) 40 MG tablet Take 120 mg by mouth daily.    Marland Kitchen glipiZIDE (GLUCOTROL) 5 MG tablet TAKE 1 TABLET (5 MG TOTAL) BY MOUTH 2 (TWO) TIMES DAILY BEFORE MEALS (REFER FUTURE REFILLS TO PRIMARY CARE PHYSICIAN) 180 tablet 0  . Misc Natural Products (TART CHERRY ADVANCED PO) Take 1 tablet by mouth daily. Reported on 02/21/2016    . nitroGLYCERIN (NITROSTAT) 0.4 MG SL tablet Place 1 tablet (0.4 mg total) under the tongue every 5 (five) minutes as needed for chest pain. 25 tablet 2  . sacubitril-valsartan (ENTRESTO) 49-51 MG Take 1 tablet by mouth 2 (two) times daily. 60 tablet 6  . spironolactone (ALDACTONE) 25 MG tablet Take 1 tablet (25 mg total) by mouth daily. 90 tablet 3  . torsemide (DEMADEX) 20 MG tablet Take 4 tablets (80 mg total) by mouth daily. 120 tablet 3   No current facility-administered medications for this visit.     No Known Allergies  Review of Systems  Constitutional: Negative for activity change, appetite change, fatigue and unexpected weight change.  HENT: Negative.        Has his own teeth and saw dentist 8 months ago  Eyes: Negative.   Respiratory: Negative for cough, chest tightness and shortness of breath.   Cardiovascular: Positive for leg swelling. Negative for chest pain and palpitations.  Gastrointestinal: Negative.   Endocrine: Negative.   Genitourinary: Negative.   Musculoskeletal: Positive for joint swelling.       Severe tophaceous gout mainly affecting his knees and elbows but with some tophi in the finger joints.  Skin: Negative.   Neurological: Negative.   Hematological: Negative.   Psychiatric/Behavioral: Negative.     BP 116/79   Pulse 60   Resp 16 Comment: ON RA  Ht 6' (1.829 m)   Wt  223 lb (101.2 kg)   SpO2 99% Comment: ON RA  BMI 30.24 kg/m  Physical Exam  Constitutional: He is oriented to person, place, and time. He appears well-developed and well-nourished. No distress.  HENT:  Head: Normocephalic and atraumatic.  Mouth/Throat: Oropharynx is clear and moist.  Eyes: Conjunctivae and EOM are normal. Pupils are equal, round, and reactive to light.  Neck: Normal range of motion. Neck supple. No JVD present. No thyromegaly present.  Cardiovascular: Normal rate, regular rhythm and normal heart sounds.  No murmur heard. Pulmonary/Chest: Effort normal and breath sounds normal. No respiratory distress. He has no wheezes. He has no rales.  Old sternotomy scar  Abdominal: Soft. Bowel sounds are normal. He exhibits no distension and no mass. There is no tenderness.  Musculoskeletal:  Marked edema of both lower extremities. Prior endoscopic vein harvest on left.  Tophi of finger joints  Lymphadenopathy:    He has no cervical adenopathy.  Neurological: He is alert and oriented to person, place, and time. He has normal strength. No cranial nerve deficit or sensory deficit.  Skin: Skin is warm and dry.  Psychiatric: He has a normal mood and affect.     Diagnostic Tests:           *[place]*                   *[facility]*                         [address]                        [place], [place] [address]                            [phone]  ------------------------------------------------------------------- Transthoracic Echocardiography  Patient:    James [name], James [name] MR #:       [id number] Study Date: 07/09/2016 Gender:     M Age:        55 Height:     182.9 cm Weight:     108.4 kg BSA:        2.3 m^2 Pt. Status: Room:   ATTENDING  Marca Ancona, M.D.  ORDERING   Marca Ancona, M.D.  REFERRING  Marca Ancona, M.D.  REFERRING  Joaquin Courts  cc:  ------------------------------------------------------------------- LV  EF: 15%  ------------------------------------------------------------------- Study Conclusions  - Left ventricle: The cavity size was mildly dilated. Wall   thickness was normal. The estimated ejection fraction was 15%.   Diffuse hypokinesis. Features are consistent with a pseudonormal   left ventricular filling pattern, with concomitant abnormal   relaxation and increased filling pressure (grade 2 diastolic   dysfunction). E/medial e&' > 15, suggesting LV end diastolic   pressure at least 20 mmHg. - Aortic valve: There was no stenosis. - Mitral valve: Moderately calcified annulus. Mildly calcified   leaflets . There was moderate regurgitation. - Left atrium: The atrium was moderately dilated. - Right ventricle: The cavity size was mildly dilated. Pacer wire   or catheter noted in right ventricle. Systolic function was   mildly to moderately reduced. - Tricuspid valve: There was moderate regurgitation. Peak RV-RA   gradient (S): 54 mm Hg. - Pulmonary arteries: PA peak pressure: 69 mm Hg (S). - Systemic veins: IVC measured 3.1 cm with < 50% respirophasic   variation, suggesting RA pressure at least 15 mmHg.  Impressions:  - Mildly dilated LV with EF 15%, diffuse hypokinesis. Moderate   diastolic dysfunction with evidence for elevated LV filling   pressure. Mildly dilated RV with mild to moderately decreased   systolic function. Moderate MR. Moderate to severe pulmonary   hypertension.  ------------------------------------------------------------------- Study data:   Study status:  Routine.  Procedure:  Transthoracic echocardiography. Image quality was adequate. Transthoracic echocardiography.  M-mode, complete 2D, spectral Doppler, and color Doppler.  Birthdate:  Patient birthdate: 08/18/1961.  Age:  Patient  is 55 yr old.  Sex:  Gender: male. BMI: 32.4 kg/m^2.  Patient status:  Inpatient.  Study date:  Study date: 07/09/2016. Study time: 02:17  PM.  -------------------------------------------------------------------  ------------------------------------------------------------------- Left ventricle:  The cavity size was mildly dilated. Wall thickness was normal. The estimated ejection fraction was 15%. Diffuse hypokinesis. Features are consistent with a pseudonormal left ventricular filling pattern, with concomitant abnormal relaxation and increased filling pressure (grade 2 diastolic dysfunction). E/medial e&' > 15, suggesting LV end diastolic pressure at least 20 mmHg.  ------------------------------------------------------------------- Aortic valve:   Trileaflet; mildly calcified leaflets.  Doppler: There was no stenosis.   There was no regurgitation.  ------------------------------------------------------------------- Aorta:  Aortic root: The aortic root was normal in size. Ascending aorta: The ascending aorta was normal in size.  ------------------------------------------------------------------- Mitral valve:   Moderately calcified annulus. Mildly calcified leaflets .  Doppler:   There was no evidence for stenosis.   There was moderate regurgitation.    Peak gradient (D): 5 mm Hg.  ------------------------------------------------------------------- Left atrium:  The atrium was moderately dilated.  ------------------------------------------------------------------- Right ventricle:  The cavity size was mildly dilated. Pacer wire or catheter noted in right ventricle. Systolic function was mildly to moderately reduced.  ------------------------------------------------------------------- Pulmonic valve:    Structurally normal valve.   Cusp separation was normal.  Doppler:  Transvalvular velocity was within the normal range. There was trivial regurgitation.  ------------------------------------------------------------------- Tricuspid valve:   Doppler:  There was moderate regurgitation.    ------------------------------------------------------------------- Pericardium:  There was no pericardial effusion.  ------------------------------------------------------------------- Systemic veins:  IVC measured 3.1 cm with < 50% respirophasic variation, suggesting RA pressure at least 15 mmHg.  ------------------------------------------------------------------- Measurements   Left ventricle                          Value        Reference  LV ID, ED, PLAX chordal          (H)    61.6  mm     43 - 52  LV ID, ES, PLAX chordal          (H)    53.7  mm     23 - 38  LV fx shortening, PLAX chordal   (L)    13    %      >=29  LV PW thickness, ED                     11.2  mm     ---------  IVS/LV PW ratio, ED                     0.81         <=1.3  Stroke volume, 2D                       64    ml     ---------  Stroke volume/bsa, 2D                   28    ml/m^2 ---------  LV ejection fraction, 1-p A4C           34    %      ---------  LV end-diastolic volume, 2-p            263   ml     ---------  LV end-systolic volume, 2-p  180   ml     ---------  LV ejection fraction, 2-p               32    %      ---------  Stroke volume, 2-p                      83    ml     ---------  LV end-diastolic volume/bsa, 2-p        114   ml/m^2 ---------  LV end-systolic volume/bsa, 2-p         78    ml/m^2 ---------  Stroke volume/bsa, 2-p                  36.1  ml/m^2 ---------  LV e&', lateral                          6.09  cm/s   ---------  LV E/e&', lateral                        18.56        ---------  LV e&', medial                           2.72  cm/s   ---------  LV E/e&', medial                         41.54        ---------  LV e&', average                          4.41  cm/s   ---------  LV E/e&', average                        25.65        ---------  Longitudinal strain, TDI                7     %      ---------    Ventricular septum                      Value         Reference  IVS thickness, ED                       9.06  mm     ---------    LVOT                                    Value        Reference  LVOT ID, S                              24    mm     ---------  LVOT area                               4.52  cm^2   ---------  LVOT peak velocity, S  68    cm/s   ---------  LVOT mean velocity, S                   49.5  cm/s   ---------  LVOT VTI, S                             14.2  cm     ---------    Aorta                                   Value        Reference  Aortic root ID, ED                      34    mm     ---------    Left atrium                             Value        Reference  LA ID, A-P, ES                          47    mm     ---------  LA ID/bsa, A-P                          2.04  cm/m^2 <=2.2  LA volume, S                            108   ml     ---------  LA volume/bsa, S                        47    ml/m^2 ---------  LA volume, ES, 1-p A4C                  119   ml     ---------  LA volume/bsa, ES, 1-p A4C              51.7  ml/m^2 ---------  LA volume, ES, 1-p A2C                  95.6  ml     ---------  LA volume/bsa, ES, 1-p A2C              41.6  ml/m^2 ---------    Mitral valve                            Value        Reference  Mitral E-wave peak velocity             113   cm/s   ---------  Mitral deceleration time                180   ms     150 - 230  Mitral peak gradient, D                 5     mm Hg  ---------    Pulmonary arteries  Value        Reference  PA pressure, S, DP               (H)    69    mm Hg  <=30    Tricuspid valve                         Value        Reference  Tricuspid regurg peak velocity          368   cm/s   ---------  Tricuspid peak RV-RA gradient           54    mm Hg  ---------  Legend: (L)  and  (H)  mark values outside specified reference range.  ------------------------------------------------------------------- Prepared and Electronically  Authenticated by  Marca Ancona, M.D. 2017-09-19T15:35:13   Laurey Morale, MD (Primary)    Procedures   Right Heart Cath  Conclusion   1. Elevated right and left heart filling pressures. 2. Mixed pulmonary venous/pulmonary arterial HTN with PVR 5 WU.  3. Low, but not markedly low, cardiac output by Fick and thermodilution.   - Increase torsemide to 60 mg daily.  - Add digoxin 0.125 daily, will need to follow levels.  - Would continue LVAD workup. He is tenuous with borderline renal function even though cardiac output was not markedly low on cath today.   Procedural Details/Technique   Technical Details Procedure: Right Heart Cath  Indication: CHF, ?low output.   Procedural Details: The right groin was prepped, draped, and anesthetized with 1% lidocaine. Using the modified Seldinger technique a 7 French sheath was placed in the right femoral vein. A Swan-Ganz catheter was used for the right heart catheterization. Standard protocol was followed for recording of right heart pressures and sampling of oxygen saturations. Fick and thermodilution cardiac output was calculated. There were no immediate procedural complications. The patient was transferred to the post catheterization recovery area for further monitoring.   Estimated blood loss <50 mL. . During this procedure the patient was administered the following to achieve and maintain moderate conscious sedation: Versed 1 mg, Fentanyl 50 mcg, while the patient's heart rate, blood pressure, and oxygen saturation were continuously monitored. The period of conscious sedation was 34 minutes, of which I was present face-to-face 100% of this time.    Right Heart   Right Heart Pressures RHC Procedural Findings: Hemodynamics (mmHg) RA mean 14 RV 67/14 PA 70/35, mean 49 PCWP mean 24 RA/PCWP 0.58  Oxygen saturations: PA 57% AO 99%  Cardiac Output (Fick) 4.89  Cardiac Index (Fick) 2.14 PVR 5.1 WU  Cardiac Output (Thermo)  4.97 Cardiac Index (Thermo) 2.17 PVR 5 WU    Implants     No implant documentation for this case.  PACS Images   Show images for Cardiac catheterization   Link to Procedure Log   Procedure Log    Hemo Data   Flowsheet Row Most Recent Value  Fick Cardiac Output 4.89 L/min  Fick Cardiac Output Index 2.14 (L/min)/BSA  Thermal Cardiac Output 4.97 L/min  Thermal Cardiac Output Index 2.17 (L/min)/BSA  RA A Wave 16 mmHg  RA V Wave 16 mmHg  RA Mean 14 mmHg  RV Systolic Pressure 67 mmHg  RV Diastolic Pressure 6 mmHg  RV EDP 14 mmHg  PA Systolic Pressure 70 mmHg  PA Diastolic Pressure 35 mmHg  PA Mean 49 mmHg  PW A Wave 24 mmHg  PW V Wave 33  mmHg  PW Mean 24 mmHg  TPVR Index 22.57 HRUI    Impression:  This 55 year old gentleman has severe ischemic cardiomyopathy with chronic systolic CHF and a deteriorating EF that was 25% at the time of his surgery in 2012 and now 15% with a mildly dilated RV with mild to moderate RV systolic dysfunction. His last RHC showed a RA/PCWP of 0.58, PAPI of 2.5 and PVR of 5. He is feeling fairly well at this time after significant diuresis but despite diuresis he is still significantly volume overloaded. He has an elevated creatinine of 1.65 and has varied from 1.5-1.75 over the past 6 months. I think the combination of low EF, poor CPX test last year, persistent volume overload despite increased diuresis, renal dysfunction and a degree of RV dysfunction portends a likelihood of progressive deterioration and at 55 he will require an LVAD and possible transplant as long as he does not deteriorate too far. He is still fairly functional, eating well and walking. He is scheduled for a repeat CPX in the near future. I agree with proceeding with preop assessment and work up for an LVAD and he may be able to have a transplant evaluation prior to LVAD therapy. He will need a L/R heart cath and CT of the chest but will wait until his CPX is done. I discussed advanced  heart failure treatment options with him including inotropic therapy, LVAD as bridge or destination, transplantation and answered all of his questions. He seems very open to the idea of an LVAD but unfortunately his wife could not be here today.   Plan:  He will have a CPX test performed on 10/16 and will follow up in the heart failure clinic on 10/31. We will review the results of his CPX and discuss him in MRB before proceeding with further workup for LVAD therapy.   I spent 60 minutes performing this consultation and > 50% of this time was spent face to face counseling and coordinating the care of this patient's ischemic cardiomyopathy and chronic systolic CHF.  Alleen Borne, MD [facility] [phone]

## 2016-08-02 ENCOUNTER — Telehealth (HOSPITAL_COMMUNITY): Payer: Self-pay | Admitting: Vascular Surgery

## 2016-08-02 NOTE — Telephone Encounter (Signed)
Pt rescheduled cpx because his gout flair up in knee

## 2016-08-05 ENCOUNTER — Encounter (HOSPITAL_COMMUNITY): Payer: Medicare PPO

## 2016-08-06 LAB — CUP PACEART REMOTE DEVICE CHECK
Battery Remaining Longevity: 98 mo
Battery Remaining Percentage: 92 %
Battery Voltage: 3.16 V
Brady Statistic RV Percent Paced: 1 %
Date Time Interrogation Session: 20170921060018
HIGH POWER IMPEDANCE MEASURED VALUE: 46 Ohm
HIGH POWER IMPEDANCE MEASURED VALUE: 46 Ohm
Lead Channel Impedance Value: 390 Ohm
Lead Channel Pacing Threshold Amplitude: 0.75 V
MDC IDC LEAD IMPLANT DT: 20161216
MDC IDC LEAD LOCATION: 753860
MDC IDC MSMT LEADCHNL RV PACING THRESHOLD PULSEWIDTH: 0.5 ms
MDC IDC MSMT LEADCHNL RV SENSING INTR AMPL: 8 mV
MDC IDC PG SERIAL: 7308121
MDC IDC SET LEADCHNL RV PACING AMPLITUDE: 2.5 V
MDC IDC SET LEADCHNL RV PACING PULSEWIDTH: 0.5 ms
MDC IDC SET LEADCHNL RV SENSING SENSITIVITY: 0.5 mV

## 2016-08-08 ENCOUNTER — Telehealth (HOSPITAL_COMMUNITY): Payer: Self-pay | Admitting: Infectious Diseases

## 2016-08-08 NOTE — Telephone Encounter (Signed)
Called and LVM to introduce myself and role here in VAD Clinic. From Dr. Sharee PimpleBartle's note we will proceed with evaluation for LVAD pending his CPX testing on 08/15/16. He has a clinic appointment on Tuesday 08/20/16 with Dr. Shirlee LatchMcLean.   Will need to schedule an appointment with me in VAD clinic to discuss evaluation process and begin education. To meet with CSW that day as well. Requested to call back to discuss plan.

## 2016-08-15 ENCOUNTER — Ambulatory Visit (HOSPITAL_COMMUNITY): Payer: Medicare PPO | Attending: Cardiology

## 2016-08-16 ENCOUNTER — Other Ambulatory Visit (HOSPITAL_COMMUNITY): Payer: Self-pay | Admitting: Cardiology

## 2016-08-20 ENCOUNTER — Inpatient Hospital Stay (HOSPITAL_COMMUNITY): Admission: RE | Admit: 2016-08-20 | Payer: Medicare PPO | Source: Ambulatory Visit

## 2016-08-27 ENCOUNTER — Telehealth (HOSPITAL_COMMUNITY): Payer: Self-pay | Admitting: Pharmacist

## 2016-08-27 NOTE — Telephone Encounter (Signed)
-----   Message from Chyrl CivatteMegan G Bradley, RN sent at 08/15/2016  2:09 PM EDT ----- Patient came to CPX today and said he had to pay for Bidil and Entresto which were both expensive for 30 day supply. Unsure how much or what the circumstance is, Baxter HireKristen relayed info to me. Can you look into this?

## 2016-08-27 NOTE — Telephone Encounter (Signed)
Mr. [name] has run out of [employer] foundation coverage for Entresto/Bidil cost. He will not be able to reapply until May 2018. I will mail him the manufacturer patient assistance applications for both and he can come for samples in the meantime.   Tyler DeisErika K. Bonnye FavaNicolsen, PharmD, BCPS, CPP Clinical Pharmacist Pager: [phone] Phone: [phone] 08/27/2016 3:38 PM

## 2016-09-09 ENCOUNTER — Other Ambulatory Visit (HOSPITAL_COMMUNITY): Payer: Self-pay | Admitting: Internal Medicine

## 2016-09-20 ENCOUNTER — Telehealth (HOSPITAL_COMMUNITY): Payer: Self-pay

## 2016-09-20 ENCOUNTER — Other Ambulatory Visit: Payer: Self-pay | Admitting: Cardiology

## 2016-09-20 ENCOUNTER — Other Ambulatory Visit (HOSPITAL_COMMUNITY): Payer: Self-pay | Admitting: Cardiology

## 2016-09-20 MED ORDER — ISOSORB DINITRATE-HYDRALAZINE 20-37.5 MG PO TABS: ORAL_TABLET | ORAL | 3 refills | Status: DC

## 2016-09-20 MED ORDER — SACUBITRIL-VALSARTAN 49-51 MG PO TABS: 1.0000 | ORAL_TABLET | Freq: Two times a day (BID) | ORAL | 3 refills | Status: DC

## 2016-09-20 MED ORDER — CARVEDILOL 25 MG PO TABS: 25.0000 mg | ORAL_TABLET | Freq: Two times a day (BID) | ORAL | 3 refills | Status: DC

## 2016-09-20 MED ORDER — SPIRONOLACTONE 25 MG PO TABS: 25.0000 mg | ORAL_TABLET | Freq: Every day | ORAL | 3 refills | Status: DC

## 2016-09-20 MED ORDER — ATORVASTATIN CALCIUM 80 MG PO TABS: ORAL_TABLET | ORAL | 3 refills | Status: AC

## 2016-09-20 NOTE — Telephone Encounter (Signed)
Patient calling CHF clinic triage line asking for "refills on everything, im out of everything". [facility]. Educated on importance of refilling medications 1 week before he runs out. Advised to report to ED if symptomatic before he received medications. Patient voices understanding and aware and agreeable to plan as stated above.  Ave FilterBradley, Dovber Ernest Genevea, RN

## 2016-09-23 ENCOUNTER — Telehealth (HOSPITAL_COMMUNITY): Payer: Self-pay | Admitting: Pharmacist

## 2016-09-23 ENCOUNTER — Other Ambulatory Visit (HOSPITAL_COMMUNITY): Payer: Self-pay | Admitting: *Deleted

## 2016-09-23 MED ORDER — SACUBITRIL-VALSARTAN 49-51 MG PO TABS
1.0000 | ORAL_TABLET | Freq: Two times a day (BID) | ORAL | 5 refills | Status: DC
Start: 2016-09-23 — End: 2016-11-12

## 2016-09-23 NOTE — Telephone Encounter (Signed)
Was able to get a 2nd [employer] foundation grant for [name] so that he will have another $800 toward his copay costs of Bidil and Entresto through 02/19/17. Relayed info to [facility] [place] who verified $0 copay.   Member ID - [id number] Disease Fund - Heart Failure 2nd grant amount - $800  Total remaining balance - $800  Eligibility End Date - 02/19/2017   Tyler DeisErika K. Bonnye FavaNicolsen, PharmD, BCPS, CPP Clinical Pharmacist Pager: [phone] Phone: [phone] 09/23/2016 2:19 PM

## 2016-10-02 ENCOUNTER — Encounter: Payer: Self-pay | Admitting: Cardiology

## 2016-10-03 ENCOUNTER — Telehealth (HOSPITAL_COMMUNITY): Payer: Self-pay | Admitting: Pharmacist

## 2016-10-03 NOTE — Telephone Encounter (Signed)
James [name] called stating [name] did not go through his insurance or PAN foundation. Called and had [employer] back it out of their system and [facility] [place] was able to process it.   Tyler DeisErika K. Bonnye FavaNicolsen, PharmD, BCPS, CPP Clinical Pharmacist Pager: [phone] Phone: [phone] 10/03/2016 4:46 PM

## 2016-10-08 ENCOUNTER — Encounter (INDEPENDENT_AMBULATORY_CARE_PROVIDER_SITE_OTHER): Payer: Self-pay

## 2016-10-08 ENCOUNTER — Ambulatory Visit (INDEPENDENT_AMBULATORY_CARE_PROVIDER_SITE_OTHER): Payer: Medicare PPO | Admitting: Cardiology

## 2016-10-08 ENCOUNTER — Encounter: Payer: Self-pay | Admitting: Cardiology

## 2016-10-08 VITALS — BP 90/54 | HR 58 | Ht 72.0 in | Wt 200.0 lb

## 2016-10-08 DIAGNOSIS — I255 Ischemic cardiomyopathy: Secondary | ICD-10-CM | POA: Diagnosis not present

## 2016-10-08 NOTE — Progress Notes (Signed)
Electrophysiology Office Note   Date:  10/08/2016   ID:  James [name], DOB October 05, 1961, MRN [id number]  PCP:  Joaquin CourtsFAVERO,JOHN PATRICK, DO  Cardiologist:  Rollene RotundaJames Hochrein, Marca Anconaalton McLean Primary Electrophysiologist: Regan LemmingWill Martin Rini Moffit, MD    Chief Complaint  Patient presents with  . DEFIB CHECK    Chronic systolic congestive heart failure      History of Present Illness: James [name] is a 55 y.o. male who presents today for electrophysiology evaluation.   He has a history of ischemic cardiomyopathy, HTN, DM, and renal insufficiency.  He has an EF of 15-20%.  He has been on optimal medical therapy since at least 2014.  He had an ICD placed on 10/06/15.  He has been feeling well without any major complaints. He is being followed by his rheumatologist working on his gout, and he says that he has been feeling much better since then. He is able to exercise as needed.  Today, he denies symptoms of palpitations, chest pain, shortness of breath, orthopnea, PND, lower extremity edema, claudication, dizziness, presyncope, syncope, bleeding, or neurologic sequela. The patient is tolerating medications without difficulties and is otherwise without complaint today.    Past Medical History:  Diagnosis Date  . AICD (automatic cardioverter/defibrillator) present 09/2015   [facility] Jude Medical [place] VR model [id number] (serial Number [id number]) ICD   . Chronic edema   . CMI (chronic mesenteric ischemia) (HCC)   . Coronary artery disease   . Gout   . Gout   . Hypertension   . Ischemic cardiomyopathy    EF 25%  . Myocardial infarction    "they saw I'd had one; not sure when but it was before 2012"  . Obesity   . Type II diabetes mellitus (HCC)    Past Surgical History:  Procedure Laterality Date  . CARDIAC CATHETERIZATION  11/2010  . CARDIAC CATHETERIZATION N/A 07/15/2016   Procedure: Right Heart Cath;  Surgeon: Laurey Moralealton S McLean, MD;  [facility]VAS[facility] LAB;  Service: Cardiovascular;   Laterality: N/A;  . CORONARY ARTERY BYPASS GRAFT  11/2010   Sequential left internal mammary graft to the mid and distal LAD, sequential sapenous vein graft to the first, second, and third obutuse marginal branches  of the  left circumflex  coronary artery.  Saphenous vein graft to the distal right coronary artery.  . EP IMPLANTABLE DEVICE N/A 10/06/2015   Procedure: ICD Implant;  Surgeon: Meshia Rau Jorja LoaMartin Pearla Mckinny, MD; [facility] Jude Medical [place] VR model [id number] (serial Number [id number]) ICD implanted L chest   . SHOULDER OPEN ROTATOR CUFF REPAIR Right ~ 2000     Current Outpatient Prescriptions  Medication Sig Dispense Refill  . aspirin EC 81 MG tablet Take 81 mg by mouth daily.    Marland Kitchen. atorvastatin (LIPITOR) 80 MG tablet TAKE ONE TABLET BY MOUTH DAILY 90 tablet 3  . carvedilol (COREG) 25 MG tablet Take 1 tablet (25 mg total) by mouth 2 (two) times daily with a meal. Please schedule appointment for refills. 180 tablet 3  . colchicine 0.6 MG tablet Take 0.6 mg by mouth daily as needed (gout). Reported on 02/21/2016    . febuxostat (ULORIC) 40 MG tablet Take 120 mg by mouth daily.    Marland Kitchen. glipiZIDE (GLUCOTROL) 5 MG tablet TAKE 1 TABLET (5 MG TOTAL) BY MOUTH 2 (TWO) TIMES DAILY BEFORE MEALS (REFER FUTURE REFILLS TO PRIMARY CARE PHYSICIAN) 180 tablet 0  . isosorbide-hydrALAZINE (BIDIL) 20-37.5 MG tablet TAKE 1 TABLET BY MOUTH 3 (THREE) TIMES  DAILY. 270 tablet 3  . Misc Natural Products (TART CHERRY ADVANCED PO) Take 1 tablet by mouth daily. Reported on 02/21/2016    . sacubitril-valsartan (ENTRESTO) 49-51 MG Take 1 tablet by mouth 2 (two) times daily. 60 tablet 5  . spironolactone (ALDACTONE) 25 MG tablet Take 1 tablet (25 mg total) by mouth daily. 90 tablet 3  . torsemide (DEMADEX) 20 MG tablet TAKE 1 TABLET EVERY OTHER DAY ALTERNATING WITH 2 TABLETS EVERY OTHER DAY 135 tablet 3  . nitroGLYCERIN (NITROSTAT) 0.4 MG SL tablet Place 1 tablet (0.4 mg total) under the tongue every 5 (five) minutes as needed for chest  pain. 25 tablet 2   No current facility-administered medications for this visit.     Allergies:   Patient has no known allergies.   Social History:  The patient  reports that he has never smoked. He has quit using smokeless tobacco. His smokeless tobacco use included Chew. He reports that he drinks alcohol. He reports that he does not use drugs.   Family History:  The patient's family history includes Diabetes in his other; Diabetes type II in his mother; Hypertension in his father, mother, other, paternal grandmother, and sister.    ROS:  Please see the history of present illness.  All other systems are reviewed and positive for muscle pain.    PHYSICAL EXAM: VS:  BP (!) 90/54   Pulse (!) 58   Ht 6' (1.829 m)   Wt 200 lb (90.7 kg)   BMI 27.12 kg/m  , BMI Body mass index is 27.12 kg/m. GEN: Well nourished, well developed, in no acute distress  HEENT: normal  Neck: no JVD, carotid bruits, or masses Cardiac: RRR; no murmurs, rubs, or gallops,no edema  Respiratory:  clear to auscultation bilaterally, normal work of breathing GI: soft, nontender, nondistended, + BS MS: no deformity or atrophy  Skin: warm and dry, device site C/D/I Neuro:  Strength and sensation are intact Psych: euthymic mood, full affect  EKG:  EKG is not ordered today. Personal review of the ekg ordered 07/15/16 today shows sinus rhythm, RBBB, anterior Q waves  Device interrogation reviewed today, results in PaceArt.  Recent Labs: 07/09/2016: B Natriuretic Peptide 2,597.9 07/15/2016: Hemoglobin 10.9; Platelets 143 07/23/2016: BUN 33; Creatinine, Ser 1.65; Potassium 3.8; Sodium 139    Lipid Panel     Component Value Date/Time   CHOL 164 08/01/2015 1045   TRIG 72 08/01/2015 1045   HDL 65 08/01/2015 1045   CHOLHDL 2.5 08/01/2015 1045   VLDL 14 08/01/2015 1045   LDLCALC 85 08/01/2015 1045     Wt Readings from Last 3 Encounters:  10/08/16 200 lb (90.7 kg)  07/30/16 223 lb (101.2 kg)  07/23/16 234 lb 8  oz (106.4 kg)      Other studies Reviewed: Additional studies/ records that were reviewed today include: TTE 07/2015  Review of the above records today demonstrates:  - Left ventricle: The cavity size was severely dilated. Systolic function was vigorous. The estimated ejection fraction was in the range of 15% to 20%. Wall motion was normal; there were no regional wall motion abnormalities. Doppler parameters are consistent with a reversible restrictive pattern, indicative of decreased left ventricular diastolic compliance and/or increased left atrial pressure (grade 3 diastolic dysfunction). - Ventricular septum: The contour showed diastolic flattening and systolic flattening consistent with RV pressure overload. - Mitral valve: Transvalvular velocity was within the normal range. There was no evidence for stenosis. There was mild regurgitation. - Left atrium:  The atrium was moderately to severely dilated. - Right ventricle: The cavity size was moderately dilated. Wall thickness was normal. - Pulmonic valve: There was trivial regurgitation.   ASSESSMENT AND PLAN:  1.  Chronic systolic congestive heart failure due to nonischemic cardiomyopathy: Has NYHA class I-II symptoms.  ICD placed 10/06/15 and tolerating well to this point.   We have made no major changes in his device, and he has not had any worrisome arrhythmias. Diuretics were adjusted in the past which improved his lower extremity edema. We'll continue current management.  2. Hypertension: Blood pressure is a little low today. He is feeling well and we Lyza Houseworth therefore not make any further changes.  3. Coronary artery disease: Currently no active chest pain.  Current medicines are reviewed at length with the patient today.   The patient does not have concerns regarding his medicines.  The following changes were made today:  none  Labs/ tests ordered today include:   No orders of the defined types were placed  in this encounter.    Disposition:   FU with Mariaisabel Bodiford 9 months.  Signed, Telma Pyeatt Jorja LoaMartin Quintyn Dombek, MD  10/08/2016 11:19 AM     [facility] [address] [place] [phone] (office) [phone] (fax)

## 2016-10-08 NOTE — Patient Instructions (Signed)
Medication Instructions:    Your physician recommends that you continue on your current medications as directed. Please refer to the Current Medication list given to you today.  --- If you need a refill on your cardiac medications before your next appointment, please call your pharmacy. ---  Labwork:  None ordered  Testing/Procedures:  None ordered  Follow-Up: Remote monitoring is used to monitor your Pacemaker of ICD from home. This monitoring reduces the number of office visits required to check your device to one time per year. It allows us to keep an eye on the functioning of your device to ensure it is working properly. You are scheduled for a device check from home on 01/07/2017. You may send your transmission at any time that day. If you have a wireless device, the transmission will be sent automatically. After your physician reviews your transmission, you will receive a postcard with your next transmission date.   Your physician wants you to follow-up in: 1 year with Dr. Elberta Fortisamnitz.  You will receive a reminder letter in the mail two months in advance. If you don't receive a letter, please call our office to schedule the follow-up appointment.  Thank you for choosing [facility]!!   Dory HornSherri Douglas Smolinsky, RN [phone]

## 2016-10-09 ENCOUNTER — Other Ambulatory Visit (HOSPITAL_COMMUNITY): Payer: Self-pay | Admitting: *Deleted

## 2016-10-09 ENCOUNTER — Ambulatory Visit (HOSPITAL_COMMUNITY): Payer: Medicare PPO | Attending: Cardiology

## 2016-10-09 DIAGNOSIS — I5022 Chronic systolic (congestive) heart failure: Secondary | ICD-10-CM

## 2016-10-09 LAB — CUP PACEART INCLINIC DEVICE CHECK
HighPow Impedance: 55.125
Implantable Lead Implant Date: 20161216
Implantable Pulse Generator Implant Date: 20161216
Lead Channel Impedance Value: 387.5 Ohm
Lead Channel Pacing Threshold Amplitude: 0.5 V
Lead Channel Sensing Intrinsic Amplitude: 9.7 mV
Lead Channel Setting Pacing Pulse Width: 0.5 ms
MDC IDC LEAD LOCATION: 753860
MDC IDC MSMT LEADCHNL RV PACING THRESHOLD PULSEWIDTH: 0.5 ms
MDC IDC SESS DTM: 20171219163149
MDC IDC SET LEADCHNL RV PACING AMPLITUDE: 2.5 V
MDC IDC SET LEADCHNL RV SENSING SENSITIVITY: 0.5 mV
MDC IDC STAT BRADY RV PERCENT PACED: 0.04 %
Pulse Gen Serial Number: 7308121

## 2016-11-12 ENCOUNTER — Encounter (HOSPITAL_COMMUNITY): Payer: Self-pay | Admitting: *Deleted

## 2016-11-12 ENCOUNTER — Ambulatory Visit (HOSPITAL_COMMUNITY)
Admission: RE | Admit: 2016-11-12 | Discharge: 2016-11-12 | Disposition: A | Discharge status: Home / Self Care | Payer: Medicare PPO | Source: Ambulatory Visit | Attending: Cardiology | Admitting: Cardiology

## 2016-11-12 ENCOUNTER — Other Ambulatory Visit (HOSPITAL_COMMUNITY): Payer: Self-pay | Admitting: *Deleted

## 2016-11-12 ENCOUNTER — Encounter (HOSPITAL_COMMUNITY): Payer: Self-pay

## 2016-11-12 ENCOUNTER — Telehealth (HOSPITAL_COMMUNITY): Payer: Self-pay | Admitting: *Deleted

## 2016-11-12 VITALS — BP 126/70 | HR 61 | Ht 72.0 in | Wt 199.8 lb

## 2016-11-12 DIAGNOSIS — Z7982 Long term (current) use of aspirin: Secondary | ICD-10-CM | POA: Insufficient documentation

## 2016-11-12 DIAGNOSIS — I255 Ischemic cardiomyopathy: Secondary | ICD-10-CM | POA: Diagnosis not present

## 2016-11-12 DIAGNOSIS — Z951 Presence of aortocoronary bypass graft: Secondary | ICD-10-CM

## 2016-11-12 DIAGNOSIS — N183 Chronic kidney disease, stage 3 unspecified: Secondary | ICD-10-CM

## 2016-11-12 DIAGNOSIS — Z5189 Encounter for other specified aftercare: Secondary | ICD-10-CM | POA: Diagnosis present

## 2016-11-12 DIAGNOSIS — E1122 Type 2 diabetes mellitus with diabetic chronic kidney disease: Secondary | ICD-10-CM | POA: Diagnosis not present

## 2016-11-12 DIAGNOSIS — Z79899 Other long term (current) drug therapy: Secondary | ICD-10-CM | POA: Diagnosis not present

## 2016-11-12 DIAGNOSIS — I5022 Chronic systolic (congestive) heart failure: Secondary | ICD-10-CM

## 2016-11-12 DIAGNOSIS — Z01818 Encounter for other preprocedural examination: Secondary | ICD-10-CM

## 2016-11-12 DIAGNOSIS — M109 Gout, unspecified: Secondary | ICD-10-CM | POA: Diagnosis not present

## 2016-11-12 LAB — COMPREHENSIVE METABOLIC PANEL
ALBUMIN: 3.4 g/dL — AB (ref 3.5–5.0)
ALK PHOS: 149 U/L — AB (ref 38–126)
ALT: 11 U/L — AB (ref 17–63)
AST: 19 U/L (ref 15–41)
Anion gap: 6 (ref 5–15)
BUN: 63 mg/dL — ABNORMAL HIGH (ref 6–20)
CHLORIDE: 108 mmol/L (ref 101–111)
CO2: 18 mmol/L — ABNORMAL LOW (ref 22–32)
CREATININE: 1.78 mg/dL — AB (ref 0.61–1.24)
Calcium: 9 mg/dL (ref 8.9–10.3)
GFR calc non Af Amer: 42 mL/min — ABNORMAL LOW (ref >60)
GFR, EST AFRICAN AMERICAN: 49 mL/min — AB (ref >60)
GLUCOSE: 80 mg/dL (ref 65–99)
Potassium: 5.6 mmol/L — ABNORMAL HIGH (ref 3.5–5.1)
SODIUM: 132 mmol/L — AB (ref 135–145)
Total Bilirubin: 0.9 mg/dL (ref 0.3–1.2)
Total Protein: 6.7 g/dL (ref 6.5–8.1)

## 2016-11-12 LAB — LACTATE DEHYDROGENASE: LDH: 160 U/L (ref 98–192)

## 2016-11-12 LAB — PSA: PSA: 0.3 ng/mL (ref 0.00–4.00)

## 2016-11-12 LAB — CBC
HCT: 29.3 % — ABNORMAL LOW (ref 39.0–52.0)
Hemoglobin: 9.7 g/dL — ABNORMAL LOW (ref 13.0–17.0)
MCH: 29 pg (ref 26.0–34.0)
MCHC: 33.1 g/dL (ref 30.0–36.0)
MCV: 87.5 fL (ref 78.0–100.0)
Platelets: 231 10*3/uL (ref 150–400)
RBC: 3.35 MIL/uL — ABNORMAL LOW (ref 4.22–5.81)
RDW: 16.3 % — AB (ref 11.5–15.5)
WBC: 2.8 10*3/uL — ABNORMAL LOW (ref 4.0–10.5)

## 2016-11-12 LAB — TSH: TSH: 6.439 u[IU]/mL — AB (ref 0.350–4.500)

## 2016-11-12 LAB — PREALBUMIN: Prealbumin: 12.4 mg/dL — ABNORMAL LOW (ref 18–38)

## 2016-11-12 LAB — BRAIN NATRIURETIC PEPTIDE: B NATRIURETIC PEPTIDE 5: 895.5 pg/mL — AB (ref 0.0–100.0)

## 2016-11-12 MED ORDER — ISOSORB DINITRATE-HYDRALAZINE 20-37.5 MG PO TABS: 1.5000 | ORAL_TABLET | Freq: Three times a day (TID) | ORAL | 3 refills | Status: DC

## 2016-11-12 MED ORDER — TORSEMIDE 20 MG PO TABS: 20.0000 mg | ORAL_TABLET | Freq: Every day | ORAL | Status: DC

## 2016-11-12 MED ORDER — SACUBITRIL-VALSARTAN 24-26 MG PO TABS: 1.0000 | ORAL_TABLET | Freq: Two times a day (BID) | ORAL | 3 refills | Status: DC

## 2016-11-12 MED ORDER — TORSEMIDE 20 MG PO TABS: 10.0000 mg | ORAL_TABLET | Freq: Every day | ORAL | Status: DC

## 2016-11-12 MED ORDER — SPIRONOLACTONE 25 MG PO TABS: 12.5000 mg | ORAL_TABLET | Freq: Every day | ORAL | 3 refills | Status: DC

## 2016-11-12 NOTE — Telephone Encounter (Signed)
Notes Recorded by Suezanne CheshirePamela S Lewis, RN on 11/12/2016 at 2:35 PM EST Called and spoke with patient. He understands and is agreeable with medication changes. Will update all changes in Epic. ------  Notes Recorded by Laurey Moralealton S McLean, MD on 11/12/2016 at 2:08 PM EST Hold torsemide x 2 days then continue at 10 mg daily (rather than increasing to 20 mg daily). Decrease Entresto to 24/26 bid, can keep Bidil at higher dose 1.5 mg tid that we discussed today. Hold spironolactone x 2 days then decrease to 12.5 daily. Stop any supplemental K.

## 2016-11-12 NOTE — Progress Notes (Signed)
Initial Encounter with LVAD Team and MCS Introduction:  James [name] is a 56 y.o. male whom  has a past medical history of AICD (automatic cardioverter/defibrillator) present (09/2015); Chronic edema; CMI (chronic mesenteric ischemia) (HCC); Coronary artery disease; Gout; Gout; Hypertension; Ischemic cardiomyopathy; Myocardial infarction; Obesity; and Type II diabetes mellitus (HCC).. We have been asked to evaluate the patient for advanced therapies which include Left Ventricular Assist Device implantation.   Lab Results  Component Value Date   ABORH A POS 12/04/2010     Lab Results  Component Value Date   CREATININE 1.65 (H) 07/23/2016   CREATININE 1.75 (H) 07/15/2016   CREATININE 1.70 (H) 07/09/2016    VAD educational packet including HeartMate II and HVAD educational pamphlets, and "Normanna HM II Patient Education" reviewed in detail with me and left at bedside for continued reference. Patient education DVD was given to her as well for reference should she want to see more about the equipment at home after reviewing the information I left her.   Explained that LVAD can be implanted for two indications in the setting of advanced left ventricular heart failure treatment:  Bridge to transplant - used for patients who cannot safely wait for heart transplant without this device.  Or   Destination therapy - used for patients until end of life or recovery of heart function.  Discussed that at this point James [name] would be considered for Destination therapy should he be deemed an acceptable VAD candidate.   Provided brief equipment overview of the HeartMate II pump and discussed placement, surgical procedure, peripheral equipment, life-long coumadin therapy, importance of medication adherence and clinic follow up for as long as patient is living on support, life-style modifications, as well as need for caregiver to be successful with this therapy.   The patient was open to the  discussion however wants to be considered for transplant first since he has had open heart surgery before. We discussed the process of the evaluation period and how [facility] whether he would be an appropriate candidate for therapy or not. Evaluation consent was reviewed, signed and provided for reference.   Caregiver Support: wife (she is a Engineer, materialsprinciple)  Art gallery managerHome Inspection Checklist: verified he has working telephone, running water and electricity at home.   Advised the patient review the materials, contact either myself or Hessie DienerMolly Reece with questions and we will plan on meeting with her at next scheduled clinic appointment. Verbalized he/she would review the evaluation consent and make a decision regarding the evaluation process.   At this point after briefly reviewing her chart some points that would exclude the patient or make them extremely high risk to have LVAD surgery include:   He has a strong desire to move towards transplant vs. LVAD if he is an acceptable candidate due to his previous sternotomy.   Discussed HVAD Device as this was previously identified to be the surgeon's preference due to previous sternotomy. Asked him to bring his wife with him to next visit so we can discuss further and plan for transplant but also keep VAD as an option should he deteriorate as his CPX testing is markedly poor per Dr. Shirlee LatchMcLean.    Session Time: 30 min   Rexene AlbertsStephanie Dixon, RN VAD Coordinator   Office: [phone] 24/7 VAD Pager: [phone]

## 2016-11-12 NOTE — Progress Notes (Signed)
Patient ID: James [name], male   DOB: September 09, 1961, 56 y.o.   MRN: [id number]     Advanced Heart Failure Clinic Note   PCP: Dr. Michel Santee Rheumatology:  Dr. Dierdre Forth HF Cardiology: Dr. Shirlee Latch  56 yo with history of gout, DM2, HTN, CKD, CAD s/p CABG, and chronic systolic CHF presents for CHF clinic evaluation.  He had CABG x 6 in 2/12.  He has a long-standing ischemic cardiomyopathy, echo in 10/16 with EF 20-25%.  He also has severe tophaceous gout.     Now s/p St Jude ICD 10/06/15.  9/17 echo with EF 15%, mild to moderately decreased RV systolic function.  CPX in 12/17 showed severe HF limitation.   Since last appointment, he has lost 35 lbs.  Now only taking torsemide 10 mg daily.  Poor appetite. He walks on most days at the [facility], tries to walk 10 times around the track there (slowly).  He is limited mainly by fatigue rather than exertional dyspnea. He is fatigued with walking up steps but not particularly short of breath.  No orthopnea/PND.  No chest pain.  No lightheadedness/syncope/falls.  No palpitations.   Corevue: No VT, stable thoracic impedance (not suggestive of volume overload).   ECG: NSR, RBBB, 1st degree AVB  Labs (8/16): HCT 38.9, K 4.9, creatinine 1.81 Labs (08/01/2015) : K 3.7 Creatinine 1.49, BNP 658, LDL 85, HDL 65 Labs (09/18/2015): K 5.1 Creatinine 1.5 Labs (02/23/2016) K 4.6, Creatinine 1.58 Labs (03/01/2016): K 3.8, Creatinine 1.53 Labs (7/17): K 4.3, creatinine 1.53 Labs (9/17): K 4, creatinine 1.75, hgb 10.9 Labs (10/17): K 4.6, creatinine 1.6, digoxin 0.9 Labs (1/18): hgb 9.7, creatinine 1.78, BUN 63, K 5.6, albumin 3.4, AST/ALT normal  PMH: 1. Tophaceous gout: Sees Dr Dierdre Forth.  2. Hyperlipidemia 3. CKD 4. Type II diabetes 5. CAD: s/p CABG 2/12 with sequential LIMA-mid and distal LAD, sequential SVG to OM1/2/3, SVG-dRCA.   6. Chronic systolic CHF: Ischemic cardiomyopathy. Echo 2013 with EF 25%.  Echo (10/16) with EF 15-20%, restrictive diastolic function,  moderately dilated RV with flattened interventricular septum, mild James.   - CPX (10/16) with peak VO2 11.5, VE/VCO2 slope 33.6, RER 1.19 => moderate to severe HF limitation.  - Echo (9/17): EF 15% with mild LV dilation, mild RV dilation with mild to moderately decreased systolic function, moderate James, PASP 69 mmHg, IVC dilated.  - St Jude ICD.  - Echo (9/17) with EF 15%, mild LV dilation, diffuse hypokinesis, mildly dilated RV with mild to moderately decreased systolic function, PASP 69 mmHg.  - RHC (9/17) with mean RA 14, PA 70/35 mean 49, mean PCWP 24, RA/PCWP 0.5, CI 2.14 Fick/2.17 thermo, PVR 5.  - CPX (12/17) with peak VO2 8.9 (26% predicted), VE/VCO2 slope 51, RER 1.12 => severe HF limitation.   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    FH: HTN.  Father with CVA.   ROS: All systems reviewed and negative except as per HPI.   Current Outpatient Prescriptions  Medication Sig Dispense Refill  . aspirin EC 81 MG tablet Take 81 mg by mouth daily.    Marland Kitchen atorvastatin (LIPITOR) 80 MG tablet TAKE ONE TABLET BY MOUTH DAILY 90 tablet 3  . carvedilol (COREG) 25 MG tablet Take 1 tablet (25 mg total) by mouth 2 (two) times daily with a meal. Please schedule appointment for refills. 180 tablet 3  . colchicine 0.6 MG tablet Take 0.6 mg by mouth daily as needed (gout). Reported on 02/21/2016    .  febuxostat (ULORIC) 40 MG tablet Take 120 mg by mouth daily.    Marland Kitchen glipiZIDE (GLUCOTROL) 5 MG tablet TAKE 1 TABLET (5 MG TOTAL) BY MOUTH 2 (TWO) TIMES DAILY BEFORE MEALS (REFER FUTURE REFILLS TO PRIMARY CARE PHYSICIAN) 180 tablet 0  . isosorbide-hydrALAZINE (BIDIL) 20-37.5 MG tablet Take 1.5 tablets by mouth 3 (three) times daily. 405 tablet 3  . Misc Natural Products (TART CHERRY ADVANCED PO) Take 1 tablet by mouth daily. Reported on 02/21/2016    . nitroGLYCERIN (NITROSTAT) 0.4 MG SL tablet Place 1 tablet (0.4 mg total) under the tongue every 5 (five) minutes as needed for chest pain. 25  tablet 2  . sacubitril-valsartan (ENTRESTO) 24-26 MG Take 1 tablet by mouth 2 (two) times daily. 60 tablet 3  . spironolactone (ALDACTONE) 25 MG tablet Take 0.5 tablets (12.5 mg total) by mouth daily. 90 tablet 3  . torsemide (DEMADEX) 20 MG tablet Take 0.5 tablets (10 mg total) by mouth daily.     No current facility-administered medications for this encounter.    BP 126/70 (BP Location: Left Arm, Patient Position: Sitting, Cuff Size: Normal)   Pulse 61   Ht 6' (1.829 m)   Wt 199 lb 12.8 oz (90.6 kg)   SpO2 100%   BMI 27.10 kg/m    Wt Readings from Last 3 Encounters:  11/12/16 199 lb 12.8 oz (90.6 kg)  10/08/16 200 lb (90.7 kg)  07/30/16 223 lb (101.2 kg)     General: NAD Neck: JVP 8 cm, no thyromegaly or thyroid nodule. Lungs: CTAB, normal effort CV: Nondisplaced PMI.  Heart regular S1/S2, no S3/S4, no murmur.  No carotid bruit.  Normal pedal pulses.  Abdomen: Soft, non-tender, non-distended, no HSM. No bruits or masses. +BS  Skin: Intact without lesions or rashes Neurologic: Alert and oriented x 3.  Psych: Normal affect. Extremities: No clubbing or cyanosis. 1+ ankle edema bilaterally. Prominent tophi noted on fingers and right calf.  HEENT: Normal.   Assessment/Plan: 1. Chronic systolic CHF: Ischemic cardiomyopathy.  EF 25% in 2013, EF 20-25% on 10/16 echo with evidence for RV volume overload.  Most recent echo was done in 9/17, showing EF 15%, mildly dilated RV with mild to moderately decreased RV systolic function.  Severe functional deficit by CPX in 12/17, worse than prior study.  He has a Secondary school teacher ICD.  He is not significantly volume overloaded by Corevue or on exam.  He has lost considerable weight.  This may be due at least in part to poor appetite/early satiety in setting of heart failure (albumin low on labs today).  NYHA class III symptoms but limited mostly by fatigue, really not much dyspnea.  Labs today show elevated creatinine and BUN as well as hyperkalemia. - Hold  torsemide x 2 days then resume at 10 mg daily.  Repeat BMET in 1 week.  - Decrease Entresto to 24/26 bid.   - Continue current Coreg 25 mg twice a day .  - Increase Bidil to 1.5 tabs tid.    - Hold spironolactone x 2 days then decrease to 12.5 daily.  - Continue digoxin 0.125 daily, check level. - RBBB, so not good candidate for CRT upgrade.  - James [name] has prominent fatigue as well as weight loss/early satiety that may signify low cardiac output in the setting of ongoing low EF (15%) and poor CPX in 12/17.  His creatinine is elevated suggestive of cardiorenal syndrome.  He may need an LVAD in the near future with possible transplant  down the road.  LVAD workup has begun, he has seen Dr. Bartle.   I am going to sLaneta Simmerset up right and left heart cath, coronary angiography to see if there is any interventional target.  If cardiac output is low on cath, will need to give serious consideration to placing LVAD.  We discussed risks and benefits of cath and he agrees to proceed.  2. Gout: Severe tophaceous gout.  - On Uloric.  Seeing Dr Dierdre ForthBeekman now.  3. CKD stage III: May be related to HTN, diabetes, hyperuricemia.  However, cardiorenal syndrome is likely playing a role as well.    4. CAD: s/p CABG.  No chest pain.  Continue ASA 81 and atorvastatin.      Followup in 3-4 weeks.   James [name] 11/12/2016

## 2016-11-12 NOTE — Patient Instructions (Addendum)
Increase Torsemide to 20 mg daily  Increase Bidil to 1 & 1/2 tabs Three times a day   Labs today  Heart Catheterization on Wed 11/27/16, see instruction sheet  Your physician recommends that you schedule a follow-up appointment in: 5 WEEKS

## 2016-11-13 LAB — LUPUS ANTICOAGULANT PANEL
DRVVT: 31.1 s (ref 0.0–47.0)
PTT LA: 42.4 s (ref 0.0–51.9)

## 2016-11-13 LAB — HEPATITIS B SURFACE ANTIGEN: Hepatitis B Surface Ag: NEGATIVE

## 2016-11-13 LAB — HIV ANTIBODY (ROUTINE TESTING W REFLEX): HIV Screen 4th Generation wRfx: NONREACTIVE

## 2016-11-13 LAB — HEMOGLOBIN A1C
HEMOGLOBIN A1C: 5.5 % (ref 4.8–5.6)
Mean Plasma Glucose: 111 mg/dL

## 2016-11-13 LAB — HEPATITIS C ANTIBODY: HCV Ab: <0.1 s/co ratio (ref 0.0–0.9)

## 2016-11-13 LAB — HEPATITIS B CORE ANTIBODY, TOTAL: Hep B Core Total Ab: NEGATIVE

## 2016-11-13 LAB — HEPATITIS B SURFACE ANTIBODY,QUALITATIVE: Hep B S Ab: NONREACTIVE

## 2016-11-15 ENCOUNTER — Telehealth (HOSPITAL_COMMUNITY): Payer: Self-pay | Admitting: *Deleted

## 2016-11-15 NOTE — Telephone Encounter (Signed)
Discussed need for further work-up for transplant eval. Patient in agreement. Will call back with appointments. Decided with patient that [facility] will be the referring center.

## 2016-11-18 LAB — FACTOR 5 LEIDEN

## 2016-11-27 ENCOUNTER — Encounter (HOSPITAL_COMMUNITY)
Admission: RE | Disposition: A | Discharge status: Home / Self Care | Payer: Self-pay | Source: Ambulatory Visit | Attending: Cardiology

## 2016-11-27 ENCOUNTER — Ambulatory Visit (HOSPITAL_COMMUNITY)
Admission: RE | Admit: 2016-11-27 | Discharge: 2016-11-27 | Disposition: A | Discharge status: Home / Self Care | Payer: Medicare PPO | Source: Ambulatory Visit | Attending: Cardiology | Admitting: Cardiology

## 2016-11-27 ENCOUNTER — Other Ambulatory Visit: Payer: Self-pay | Admitting: Internal Medicine

## 2016-11-27 DIAGNOSIS — I270 Primary pulmonary hypertension: Secondary | ICD-10-CM | POA: Insufficient documentation

## 2016-11-27 DIAGNOSIS — E875 Hyperkalemia: Secondary | ICD-10-CM | POA: Diagnosis not present

## 2016-11-27 DIAGNOSIS — Z7984 Long term (current) use of oral hypoglycemic drugs: Secondary | ICD-10-CM | POA: Diagnosis not present

## 2016-11-27 DIAGNOSIS — I2510 Atherosclerotic heart disease of native coronary artery without angina pectoris: Secondary | ICD-10-CM | POA: Diagnosis not present

## 2016-11-27 DIAGNOSIS — E1122 Type 2 diabetes mellitus with diabetic chronic kidney disease: Secondary | ICD-10-CM | POA: Diagnosis not present

## 2016-11-27 DIAGNOSIS — Z823 Family history of stroke: Secondary | ICD-10-CM | POA: Insufficient documentation

## 2016-11-27 DIAGNOSIS — Z7982 Long term (current) use of aspirin: Secondary | ICD-10-CM | POA: Insufficient documentation

## 2016-11-27 DIAGNOSIS — Z95810 Presence of automatic (implantable) cardiac defibrillator: Secondary | ICD-10-CM | POA: Insufficient documentation

## 2016-11-27 DIAGNOSIS — I255 Ischemic cardiomyopathy: Secondary | ICD-10-CM | POA: Diagnosis not present

## 2016-11-27 DIAGNOSIS — I5022 Chronic systolic (congestive) heart failure: Secondary | ICD-10-CM | POA: Insufficient documentation

## 2016-11-27 DIAGNOSIS — N183 Chronic kidney disease, stage 3 (moderate): Secondary | ICD-10-CM | POA: Insufficient documentation

## 2016-11-27 DIAGNOSIS — Z951 Presence of aortocoronary bypass graft: Secondary | ICD-10-CM | POA: Diagnosis not present

## 2016-11-27 DIAGNOSIS — E785 Hyperlipidemia, unspecified: Secondary | ICD-10-CM | POA: Diagnosis not present

## 2016-11-27 DIAGNOSIS — I4510 Unspecified right bundle-branch block: Secondary | ICD-10-CM | POA: Insufficient documentation

## 2016-11-27 DIAGNOSIS — M109 Gout, unspecified: Secondary | ICD-10-CM | POA: Insufficient documentation

## 2016-11-27 DIAGNOSIS — I130 Hypertensive heart and chronic kidney disease with heart failure and stage 1 through stage 4 chronic kidney disease, or unspecified chronic kidney disease: Secondary | ICD-10-CM | POA: Diagnosis present

## 2016-11-27 DIAGNOSIS — R6881 Early satiety: Secondary | ICD-10-CM | POA: Insufficient documentation

## 2016-11-27 DIAGNOSIS — Z8249 Family history of ischemic heart disease and other diseases of the circulatory system: Secondary | ICD-10-CM | POA: Diagnosis not present

## 2016-11-27 DIAGNOSIS — I509 Heart failure, unspecified: Secondary | ICD-10-CM | POA: Diagnosis not present

## 2016-11-27 HISTORY — PX: RIGHT HEART CATH: CATH118263

## 2016-11-27 LAB — CBC
HCT: 27.2 % — ABNORMAL LOW (ref 39.0–52.0)
Hemoglobin: 9 g/dL — ABNORMAL LOW (ref 13.0–17.0)
MCH: 29 pg (ref 26.0–34.0)
MCHC: 33.1 g/dL (ref 30.0–36.0)
MCV: 87.7 fL (ref 78.0–100.0)
PLATELETS: 252 10*3/uL (ref 150–400)
RBC: 3.1 MIL/uL — ABNORMAL LOW (ref 4.22–5.81)
RDW: 15.4 % (ref 11.5–15.5)
WBC: 3.7 10*3/uL — AB (ref 4.0–10.5)

## 2016-11-27 LAB — BASIC METABOLIC PANEL
Anion gap: 9 (ref 5–15)
BUN: 66 mg/dL — AB (ref 6–20)
CO2: 17 mmol/L — ABNORMAL LOW (ref 22–32)
Calcium: 8.7 mg/dL — ABNORMAL LOW (ref 8.9–10.3)
Chloride: 107 mmol/L (ref 101–111)
Creatinine, Ser: 1.93 mg/dL — ABNORMAL HIGH (ref 0.61–1.24)
GFR, EST AFRICAN AMERICAN: 44 mL/min — AB (ref >60)
GFR, EST NON AFRICAN AMERICAN: 38 mL/min — AB (ref >60)
Glucose, Bld: 79 mg/dL (ref 65–99)
Potassium: 5 mmol/L (ref 3.5–5.1)
SODIUM: 133 mmol/L — AB (ref 135–145)

## 2016-11-27 LAB — POCT I-STAT 3, VENOUS BLOOD GAS (G3P V)
ACID-BASE DEFICIT: 9 mmol/L — AB (ref 0.0–2.0)
Acid-base deficit: 9 mmol/L — ABNORMAL HIGH (ref 0.0–2.0)
BICARBONATE: 16.6 mmol/L — AB (ref 20.0–28.0)
Bicarbonate: 16.3 mmol/L — ABNORMAL LOW (ref 20.0–28.0)
O2 SAT: 64 %
O2 SAT: 67 %
PCO2 VEN: 33.4 mmHg — AB (ref 44.0–60.0)
PO2 VEN: 38 mmHg (ref 32.0–45.0)
TCO2: 17 mmol/L (ref 0–100)
TCO2: 18 mmol/L (ref 0–100)
pCO2, Ven: 33.6 mmHg — ABNORMAL LOW (ref 44.0–60.0)
pH, Ven: 7.295 (ref 7.250–7.430)
pH, Ven: 7.301 (ref 7.250–7.430)
pO2, Ven: 36 mmHg (ref 32.0–45.0)

## 2016-11-27 LAB — GLUCOSE, CAPILLARY
GLUCOSE-CAPILLARY: 87 mg/dL (ref 65–99)
Glucose-Capillary: 74 mg/dL (ref 65–99)

## 2016-11-27 LAB — PROTIME-INR
INR: 1.25
Prothrombin Time: 15.8 seconds — ABNORMAL HIGH (ref 11.4–15.2)

## 2016-11-27 SURGERY — RIGHT HEART CATH

## 2016-11-27 MED ORDER — HEPARIN (PORCINE) IN NACL 2-0.9 UNIT/ML-% IJ SOLN
INTRAMUSCULAR | Status: AC
  Filled 2016-11-27: qty 1000

## 2016-11-27 MED ORDER — SODIUM CHLORIDE 0.9 % IV SOLN: 250.0000 mL | INTRAVENOUS | Status: DC | PRN

## 2016-11-27 MED ORDER — SODIUM CHLORIDE 0.9% FLUSH: 3.0000 mL | Freq: Two times a day (BID) | INTRAVENOUS | Status: DC

## 2016-11-27 MED ORDER — MIDAZOLAM HCL 2 MG/2ML IJ SOLN
INTRAMUSCULAR | Status: AC
  Filled 2016-11-27: qty 2

## 2016-11-27 MED ORDER — SODIUM CHLORIDE 0.9 % IV SOLN
250.0000 mL | INTRAVENOUS | Status: DC | PRN
Start: 2016-11-27 — End: 2016-11-27

## 2016-11-27 MED ORDER — MIDAZOLAM HCL 2 MG/2ML IJ SOLN
INTRAMUSCULAR | Status: DC | PRN
  Administered 2016-11-27: 1 mg via INTRAVENOUS

## 2016-11-27 MED ORDER — ASPIRIN 81 MG PO CHEW: 81.0000 mg | CHEWABLE_TABLET | ORAL | Status: DC

## 2016-11-27 MED ORDER — SODIUM CHLORIDE 0.9% FLUSH: 3.0000 mL | INTRAVENOUS | Status: DC | PRN

## 2016-11-27 MED ORDER — LIDOCAINE HCL (PF) 1 % IJ SOLN
INTRAMUSCULAR | Status: AC
  Filled 2016-11-27: qty 30

## 2016-11-27 MED ORDER — FENTANYL CITRATE (PF) 100 MCG/2ML IJ SOLN
INTRAMUSCULAR | Status: AC
  Filled 2016-11-27: qty 2

## 2016-11-27 MED ORDER — HEPARIN (PORCINE) IN NACL 2-0.9 UNIT/ML-% IJ SOLN
INTRAMUSCULAR | Status: DC | PRN
  Administered 2016-11-27: 1000 mL

## 2016-11-27 MED ORDER — ONDANSETRON HCL 4 MG/2ML IJ SOLN: 4.0000 mg | Freq: Four times a day (QID) | INTRAMUSCULAR | Status: DC | PRN

## 2016-11-27 MED ORDER — LIDOCAINE HCL (PF) 1 % IJ SOLN
INTRAMUSCULAR | Status: DC | PRN
  Administered 2016-11-27: 15 mL via INTRADERMAL

## 2016-11-27 MED ORDER — SODIUM CHLORIDE 0.9 % IV SOLN
INTRAVENOUS | Status: DC
  Administered 2016-11-27: 11:00:00 via INTRAVENOUS

## 2016-11-27 MED ORDER — FENTANYL CITRATE (PF) 100 MCG/2ML IJ SOLN
INTRAMUSCULAR | Status: DC | PRN
  Administered 2016-11-27: 25 ug via INTRAVENOUS

## 2016-11-27 MED ORDER — ACETAMINOPHEN 325 MG PO TABS: 650.0000 mg | ORAL_TABLET | ORAL | Status: DC | PRN

## 2016-11-27 SURGICAL SUPPLY — 9 items
CATH SWAN GANZ 7F STRAIGHT (CATHETERS) ×3 IMPLANT
COVER PRB 48X5XTLSCP FOLD TPE (BAG) IMPLANT
COVER PROBE 5X48 (BAG)
KIT HEART RIGHT NAMIC (KITS) ×3 IMPLANT
PACK CARDIAC CATHETERIZATION (CUSTOM PROCEDURE TRAY) ×3 IMPLANT
PROTECTION STATION PRESSURIZED (MISCELLANEOUS) ×3
SHEATH PINNACLE 7F 10CM (SHEATH) ×3 IMPLANT
STATION PROTECTION PRESSURIZED (MISCELLANEOUS) ×1 IMPLANT
TRANSDUCER W/STOPCOCK (MISCELLANEOUS) ×3 IMPLANT

## 2016-11-27 NOTE — Progress Notes (Signed)
Order for sheath removal verified per post procedural orders. Procedure explained to patient and Rt femoral vein access site assessed: level 0, dorsalis pedis and posterior tibial pulses auscultated via doppler. 7 [place] Sheath removed and manual pressure applied for 10 minutes. Pre, peri, & post procedural vitals: HR 60-80s, RR 14-18, O2 Sat upper 90s, BP 110-125/60-70, Pain 0. Distal pulses remained intact after sheath removal. Access site level 0 and dressed with 4X4 gauze and tegaderm.  Haywood PaoKenzie Campbell, RN confirmed condition of site. Post procedural instructions discussed and return demonstration from patient.

## 2016-11-27 NOTE — Interval H&P Note (Signed)
History and Physical Interval Note:  11/27/2016 12:17 PM  James [name]  has presented today for surgery, with the diagnosis of chf  The various methods of treatment have been discussed with the patient and family. After consideration of risks, benefits and other options for treatment, the patient has consented to  Procedure(s): Right Heart Cath (N/A) as a surgical intervention .  The patient's history has been reviewed, patient examined, no change in status, stable for surgery.  I have reviewed the patient's chart and labs.  Questions were answered to the patient's satisfaction.     [name]

## 2016-11-27 NOTE — Discharge Instructions (Signed)
Femoral Site Care °Introduction °Refer to this sheet in the next few weeks. These instructions provide you with information about caring for yourself after your procedure. Your health care provider may also give you more specific instructions. Your treatment has been planned according to current medical practices, but problems sometimes occur. Call your health care provider if you have any problems or questions after your procedure. °What can I expect after the procedure? °After your procedure, it is typical to have the following: °· Bruising at the site that usually fades within 1-2 weeks. °· Blood collecting in the tissue (hematoma) that may be painful to the touch. It should usually decrease in size and tenderness within 1-2 weeks. °Follow these instructions at home: °· Take medicines only as directed by your health care provider. °· You may shower 24-48 hours after the procedure or as directed by your health care provider. Remove the bandage (dressing) and gently wash the site with plain soap and water. Pat the area dry with a clean towel. Do not rub the site, because this may cause bleeding. °· Do not take baths, swim, or use a hot tub until your health care provider approves. °· Check your insertion site every day for redness, swelling, or drainage. °· Do not apply powder or lotion to the site. °· Limit use of stairs to twice a day for the first 2-3 days or as directed by your health care provider. °· Do not squat for the first 2-3 days or as directed by your health care provider. °· Do not lift over 10 lb (4.5 kg) for 5 days after your procedure or as directed by your health care provider. °· Ask your health care provider when it is okay to: °¨ Return to work or school. °¨ Resume usual physical activities or sports. °¨ Resume sexual activity. °· Do not drive home if you are discharged the same day as the procedure. Have someone else drive you. °· You may drive 24 hours after the procedure unless otherwise  instructed by your health care provider. °· Do not operate machinery or power tools for 24 hours after the procedure or as directed by your health care provider. °· If your procedure was done as an outpatient procedure, which means that you went home the same day as your procedure, a responsible adult should be with you for the first 24 hours after you arrive home. °· Keep all follow-up visits as directed by your health care provider. This is important. °Contact a health care provider if: °· You have a fever. °· You have chills. °· You have increased bleeding from the site. Hold pressure on the site. °Get help right away if: °· You have unusual pain at the site. °· You have redness, warmth, or swelling at the site. °· You have drainage (other than a small amount of blood on the dressing) from the site. °· The site is bleeding, and the bleeding does not stop after 30 minutes of holding steady pressure on the site. °· Your leg or foot becomes pale, cool, tingly, or numb. °This information is not intended to replace advice given to you by your health care provider. Make sure you discuss any questions you have with your health care provider. °Document Released: 06/10/2014 Document Revised: 03/14/2016 Document Reviewed: 04/26/2014 °© 2017 [employer]vier ° °

## 2016-11-27 NOTE — H&P (View-Only) (Signed)
Patient ID: James [name], male   DOB: September 09, 1961, 56 y.o.   MRN: [id number]     Advanced Heart Failure Clinic Note   PCP: Dr. Michel Santee Rheumatology:  Dr. Dierdre Forth HF Cardiology: Dr. Shirlee Latch  56 yo with history of gout, DM2, HTN, CKD, CAD s/p CABG, and chronic systolic CHF presents for CHF clinic evaluation.  He had CABG x 6 in 2/12.  He has a long-standing ischemic cardiomyopathy, echo in 10/16 with EF 20-25%.  He also has severe tophaceous gout.     Now s/p St Jude ICD 10/06/15.  9/17 echo with EF 15%, mild to moderately decreased RV systolic function.  CPX in 12/17 showed severe HF limitation.   Since last appointment, he has lost 35 lbs.  Now only taking torsemide 10 mg daily.  Poor appetite. He walks on most days at the [facility], tries to walk 10 times around the track there (slowly).  He is limited mainly by fatigue rather than exertional dyspnea. He is fatigued with walking up steps but not particularly short of breath.  No orthopnea/PND.  No chest pain.  No lightheadedness/syncope/falls.  No palpitations.   Corevue: No VT, stable thoracic impedance (not suggestive of volume overload).   ECG: NSR, RBBB, 1st degree AVB  Labs (8/16): HCT 38.9, K 4.9, creatinine 1.81 Labs (08/01/2015) : K 3.7 Creatinine 1.49, BNP 658, LDL 85, HDL 65 Labs (09/18/2015): K 5.1 Creatinine 1.5 Labs (02/23/2016) K 4.6, Creatinine 1.58 Labs (03/01/2016): K 3.8, Creatinine 1.53 Labs (7/17): K 4.3, creatinine 1.53 Labs (9/17): K 4, creatinine 1.75, hgb 10.9 Labs (10/17): K 4.6, creatinine 1.6, digoxin 0.9 Labs (1/18): hgb 9.7, creatinine 1.78, BUN 63, K 5.6, albumin 3.4, AST/ALT normal  PMH: 1. Tophaceous gout: Sees Dr Dierdre Forth.  2. Hyperlipidemia 3. CKD 4. Type II diabetes 5. CAD: s/p CABG 2/12 with sequential LIMA-mid and distal LAD, sequential SVG to OM1/2/3, SVG-dRCA.   6. Chronic systolic CHF: Ischemic cardiomyopathy. Echo 2013 with EF 25%.  Echo (10/16) with EF 15-20%, restrictive diastolic function,  moderately dilated RV with flattened interventricular septum, mild James.   - CPX (10/16) with peak VO2 11.5, VE/VCO2 slope 33.6, RER 1.19 => moderate to severe HF limitation.  - Echo (9/17): EF 15% with mild LV dilation, mild RV dilation with mild to moderately decreased systolic function, moderate James, PASP 69 mmHg, IVC dilated.  - St Jude ICD.  - Echo (9/17) with EF 15%, mild LV dilation, diffuse hypokinesis, mildly dilated RV with mild to moderately decreased systolic function, PASP 69 mmHg.  - RHC (9/17) with mean RA 14, PA 70/35 mean 49, mean PCWP 24, RA/PCWP 0.5, CI 2.14 Fick/2.17 thermo, PVR 5.  - CPX (12/17) with peak VO2 8.9 (26% predicted), VE/VCO2 slope 51, RER 1.12 => severe HF limitation.   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    FH: HTN.  Father with CVA.   ROS: All systems reviewed and negative except as per HPI.   Current Outpatient Prescriptions  Medication Sig Dispense Refill  . aspirin EC 81 MG tablet Take 81 mg by mouth daily.    Marland Kitchen atorvastatin (LIPITOR) 80 MG tablet TAKE ONE TABLET BY MOUTH DAILY 90 tablet 3  . carvedilol (COREG) 25 MG tablet Take 1 tablet (25 mg total) by mouth 2 (two) times daily with a meal. Please schedule appointment for refills. 180 tablet 3  . colchicine 0.6 MG tablet Take 0.6 mg by mouth daily as needed (gout). Reported on 02/21/2016    .  febuxostat (ULORIC) 40 MG tablet Take 120 mg by mouth daily.    Marland Kitchen glipiZIDE (GLUCOTROL) 5 MG tablet TAKE 1 TABLET (5 MG TOTAL) BY MOUTH 2 (TWO) TIMES DAILY BEFORE MEALS (REFER FUTURE REFILLS TO PRIMARY CARE PHYSICIAN) 180 tablet 0  . isosorbide-hydrALAZINE (BIDIL) 20-37.5 MG tablet Take 1.5 tablets by mouth 3 (three) times daily. 405 tablet 3  . Misc Natural Products (TART CHERRY ADVANCED PO) Take 1 tablet by mouth daily. Reported on 02/21/2016    . nitroGLYCERIN (NITROSTAT) 0.4 MG SL tablet Place 1 tablet (0.4 mg total) under the tongue every 5 (five) minutes as needed for chest pain. 25  tablet 2  . sacubitril-valsartan (ENTRESTO) 24-26 MG Take 1 tablet by mouth 2 (two) times daily. 60 tablet 3  . spironolactone (ALDACTONE) 25 MG tablet Take 0.5 tablets (12.5 mg total) by mouth daily. 90 tablet 3  . torsemide (DEMADEX) 20 MG tablet Take 0.5 tablets (10 mg total) by mouth daily.     No current facility-administered medications for this encounter.    BP 126/70 (BP Location: Left Arm, Patient Position: Sitting, Cuff Size: Normal)   Pulse 61   Ht 6' (1.829 m)   Wt 199 lb 12.8 oz (90.6 kg)   SpO2 100%   BMI 27.10 kg/m    Wt Readings from Last 3 Encounters:  11/12/16 199 lb 12.8 oz (90.6 kg)  10/08/16 200 lb (90.7 kg)  07/30/16 223 lb (101.2 kg)     General: NAD Neck: JVP 8 cm, no thyromegaly or thyroid nodule. Lungs: CTAB, normal effort CV: Nondisplaced PMI.  Heart regular S1/S2, no S3/S4, no murmur.  No carotid bruit.  Normal pedal pulses.  Abdomen: Soft, non-tender, non-distended, no HSM. No bruits or masses. +BS  Skin: Intact without lesions or rashes Neurologic: Alert and oriented x 3.  Psych: Normal affect. Extremities: No clubbing or cyanosis. 1+ ankle edema bilaterally. Prominent tophi noted on fingers and right calf.  HEENT: Normal.   Assessment/Plan: 1. Chronic systolic CHF: Ischemic cardiomyopathy.  EF 25% in 2013, EF 20-25% on 10/16 echo with evidence for RV volume overload.  Most recent echo was done in 9/17, showing EF 15%, mildly dilated RV with mild to moderately decreased RV systolic function.  Severe functional deficit by CPX in 12/17, worse than prior study.  He has a Secondary school teacher ICD.  He is not significantly volume overloaded by Corevue or on exam.  He has lost considerable weight.  This may be due at least in part to poor appetite/early satiety in setting of heart failure (albumin low on labs today).  NYHA class III symptoms but limited mostly by fatigue, really not much dyspnea.  Labs today show elevated creatinine and BUN as well as hyperkalemia. - Hold  torsemide x 2 days then resume at 10 mg daily.  Repeat BMET in 1 week.  - Decrease Entresto to 24/26 bid.   - Continue current Coreg 25 mg twice a day .  - Increase Bidil to 1.5 tabs tid.    - Hold spironolactone x 2 days then decrease to 12.5 daily.  - Continue digoxin 0.125 daily, check level. - RBBB, so not good candidate for CRT upgrade.  - James [name] has prominent fatigue as well as weight loss/early satiety that may signify low cardiac output in the setting of ongoing low EF (15%) and poor CPX in 12/17.  His creatinine is elevated suggestive of cardiorenal syndrome.  He may need an LVAD in the near future with possible transplant  down the road.  LVAD workup has begun, he has seen Dr. Bartle.   I am going to sLaneta Simmerset up right and left heart cath, coronary angiography to see if there is any interventional target.  If cardiac output is low on cath, will need to give serious consideration to placing LVAD.  We discussed risks and benefits of cath and he agrees to proceed.  2. Gout: Severe tophaceous gout.  - On Uloric.  Seeing Dr Dierdre ForthBeekman now.  3. CKD stage III: May be related to HTN, diabetes, hyperuricemia.  However, cardiorenal syndrome is likely playing a role as well.    4. CAD: s/p CABG.  No chest pain.  Continue ASA 81 and atorvastatin.      Followup in 3-4 weeks.   Marca AnconaDalton Tuleen Mandelbaum 11/12/2016

## 2016-11-28 ENCOUNTER — Encounter (HOSPITAL_COMMUNITY): Payer: Self-pay | Admitting: Cardiology

## 2016-12-17 ENCOUNTER — Ambulatory Visit (HOSPITAL_COMMUNITY)
Admission: RE | Admit: 2016-12-17 | Discharge: 2016-12-17 | Disposition: A | Discharge status: Home / Self Care | Payer: Medicare PPO | Source: Ambulatory Visit | Attending: Cardiology | Admitting: Cardiology

## 2016-12-17 ENCOUNTER — Encounter (HOSPITAL_COMMUNITY): Payer: Self-pay

## 2016-12-17 VITALS — BP 104/64 | HR 63 | Wt 201.5 lb

## 2016-12-17 DIAGNOSIS — Z79899 Other long term (current) drug therapy: Secondary | ICD-10-CM | POA: Diagnosis not present

## 2016-12-17 DIAGNOSIS — E1122 Type 2 diabetes mellitus with diabetic chronic kidney disease: Secondary | ICD-10-CM | POA: Diagnosis not present

## 2016-12-17 DIAGNOSIS — Z7982 Long term (current) use of aspirin: Secondary | ICD-10-CM | POA: Insufficient documentation

## 2016-12-17 DIAGNOSIS — Z7984 Long term (current) use of oral hypoglycemic drugs: Secondary | ICD-10-CM | POA: Diagnosis not present

## 2016-12-17 DIAGNOSIS — Z823 Family history of stroke: Secondary | ICD-10-CM | POA: Diagnosis not present

## 2016-12-17 DIAGNOSIS — I4510 Unspecified right bundle-branch block: Secondary | ICD-10-CM | POA: Diagnosis not present

## 2016-12-17 DIAGNOSIS — Z951 Presence of aortocoronary bypass graft: Secondary | ICD-10-CM

## 2016-12-17 DIAGNOSIS — Z8249 Family history of ischemic heart disease and other diseases of the circulatory system: Secondary | ICD-10-CM | POA: Diagnosis not present

## 2016-12-17 DIAGNOSIS — N183 Chronic kidney disease, stage 3 unspecified: Secondary | ICD-10-CM

## 2016-12-17 DIAGNOSIS — I5022 Chronic systolic (congestive) heart failure: Secondary | ICD-10-CM | POA: Diagnosis present

## 2016-12-17 DIAGNOSIS — I255 Ischemic cardiomyopathy: Secondary | ICD-10-CM | POA: Diagnosis not present

## 2016-12-17 DIAGNOSIS — I2510 Atherosclerotic heart disease of native coronary artery without angina pectoris: Secondary | ICD-10-CM | POA: Insufficient documentation

## 2016-12-17 DIAGNOSIS — M109 Gout, unspecified: Secondary | ICD-10-CM | POA: Insufficient documentation

## 2016-12-17 DIAGNOSIS — Z9889 Other specified postprocedural states: Secondary | ICD-10-CM | POA: Insufficient documentation

## 2016-12-17 LAB — BASIC METABOLIC PANEL
Anion gap: 8 (ref 5–15)
BUN: 44 mg/dL — AB (ref 6–20)
CHLORIDE: 109 mmol/L (ref 101–111)
CO2: 20 mmol/L — ABNORMAL LOW (ref 22–32)
Calcium: 9 mg/dL (ref 8.9–10.3)
Creatinine, Ser: 1.54 mg/dL — ABNORMAL HIGH (ref 0.61–1.24)
GFR calc Af Amer: 58 mL/min — ABNORMAL LOW (ref >60)
GFR, EST NON AFRICAN AMERICAN: 50 mL/min — AB (ref >60)
Glucose, Bld: 82 mg/dL (ref 65–99)
POTASSIUM: 5.6 mmol/L — AB (ref 3.5–5.1)
SODIUM: 137 mmol/L (ref 135–145)

## 2016-12-17 LAB — BRAIN NATRIURETIC PEPTIDE: B NATRIURETIC PEPTIDE 5: 1612.8 pg/mL — AB (ref 0.0–100.0)

## 2016-12-17 LAB — DIGOXIN LEVEL: DIGOXIN LVL: 1.6 ng/mL (ref 0.8–2.0)

## 2016-12-17 NOTE — Progress Notes (Signed)
Patient ID: James [name], male   DOB: 1961/01/18, 56 y.o.   MRN: [id number]     Advanced Heart Failure Clinic Note   PCP: Dr. Michel Santee Rheumatology:  Dr. Dierdre Forth HF Cardiology: Dr. Shirlee Latch  56 yo with history of gout, DM2, HTN, CKD, CAD s/p CABG, and chronic systolic CHF presents for CHF clinic evaluation.  He had CABG x 6 in 2/12.  He has a long-standing ischemic cardiomyopathy, echo in 10/16 with EF 20-25%.  He also has severe tophaceous gout.     Now s/p St Jude ICD 10/06/15.  9/17 echo with EF 15%, mild to moderately decreased RV systolic function.  CPX in 12/17 showed severe HF limitation.  RHC in 12/17 showed mildly elevated filling pressures with preserved cardiac output.   Patient has been stable since RHC. He has been walking daily at the [facility] for 8-9 laps around the track.  He walks slowly but denies dyspnea.  He is able to walk up a flight of steps.  No chest pain.  He feels like he is doing pretty well but still fatigues easily.  No orthopnea/PND.  No lightheadedness.  He saw Dr. Dierdre Forth recently, who told him that his uric acid level is coming down.  I had him decrease his torsemide to every other day due to rise in creatinine and stable filling pressures.  Weight is up about 2 lbs since then but has remained steady.  Breathing is not worse.   ECG: NSR, RBBB, 1st degree AVB  Labs (8/16): HCT 38.9, K 4.9, creatinine 1.81 Labs (08/01/2015) : K 3.7 Creatinine 1.49, BNP 658, LDL 85, HDL 65 Labs (09/18/2015): K 5.1 Creatinine 1.5 Labs (02/23/2016) K 4.6, Creatinine 1.58 Labs (03/01/2016): K 3.8, Creatinine 1.53 Labs (7/17): K 4.3, creatinine 1.53 Labs (9/17): K 4, creatinine 1.75, hgb 10.9 Labs (10/17): K 4.6, creatinine 1.6, digoxin 0.9 Labs (1/18): hgb 9.7, creatinine 1.78, BUN 63, K 5.6, albumin 3.4, AST/ALT normal Labs (2/18): K 5, creatinine 1.93  PMH: 1. Tophaceous gout: Sees Dr Dierdre Forth.  2. Hyperlipidemia 3. CKD 4. Type II diabetes 5. CAD: s/p CABG 2/12 with sequential  LIMA-mid and distal LAD, sequential SVG to OM1/2/3, SVG-dRCA.   6. Chronic systolic CHF: Ischemic cardiomyopathy. Echo 2013 with EF 25%.  Echo (10/16) with EF 15-20%, restrictive diastolic function, moderately dilated RV with flattened interventricular septum, mild MR.   - CPX (10/16) with peak VO2 11.5, VE/VCO2 slope 33.6, RER 1.19 => moderate to severe HF limitation.  - Echo (9/17): EF 15% with mild LV dilation, mild RV dilation with mild to moderately decreased systolic function, moderate MR, PASP 69 mmHg, IVC dilated.  - St Jude ICD.  - Echo (9/17) with EF 15%, mild LV dilation, diffuse hypokinesis, mildly dilated RV with mild to moderately decreased systolic function, PASP 69 mmHg.  - RHC (9/17) with mean RA 14, PA 70/35 mean 49, mean PCWP 24, RA/PCWP 0.5, CI 2.14 Fick/2.17 thermo, PVR 5.  - CPX (12/17) with peak VO2 8.9 (26% predicted), VE/VCO2 slope 51, RER 1.12 => severe HF limitation.  - RHC (12/17): mean RA 7, PA 62/22 mean 38, mean PCWP 18, CI 3.19 Fick/PVR 2.9 WU, CI 3.56 thermo/PVR 2.65 WU.   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    FH: HTN.  Father with CVA.   ROS: All systems reviewed and negative except as per HPI.   Current Outpatient Prescriptions  Medication Sig Dispense Refill  . acetaminophen (TYLENOL) 325 MG tablet Take 325 mg by  mouth every 6 (six) hours as needed for moderate pain or headache.    Marland Kitchen. aspirin EC 81 MG tablet Take 81 mg by mouth daily.    Marland Kitchen. atorvastatin (LIPITOR) 80 MG tablet TAKE ONE TABLET BY MOUTH DAILY 90 tablet 3  . carvedilol (COREG) 25 MG tablet Take 1 tablet (25 mg total) by mouth 2 (two) times daily with a meal. Please schedule appointment for refills. 180 tablet 3  . colchicine 0.6 MG tablet Take 0.6 mg by mouth daily as needed (gout). Reported on 02/21/2016    . digoxin (LANOXIN) 0.125 MG tablet Take 0.125 mg by mouth daily.    Marland Kitchen. ENTRESTO 49-51 MG Take 0.5 tablets by mouth 2 (two) times daily.    . febuxostat (ULORIC) 40  MG tablet Take 120 mg by mouth daily.    Marland Kitchen. glipiZIDE (GLUCOTROL) 5 MG tablet TAKE 1 TABLET (5 MG TOTAL) BY MOUTH 2 (TWO) TIMES DAILY BEFORE MEALS (REFER FUTURE REFILLS TO PRIMARY CARE PHYSICIAN) 180 tablet 0  . isosorbide-hydrALAZINE (BIDIL) 20-37.5 MG tablet Take 1.5 tablets by mouth 3 (three) times daily. 405 tablet 3  . nitroGLYCERIN (NITROSTAT) 0.4 MG SL tablet Place 1 tablet (0.4 mg total) under the tongue every 5 (five) minutes as needed for chest pain. 25 tablet 2  . spironolactone (ALDACTONE) 25 MG tablet Take 0.5 tablets (12.5 mg total) by mouth daily. 90 tablet 3  . torsemide (DEMADEX) 20 MG tablet Take 10 mg by mouth every other day.     No current facility-administered medications for this encounter.    BP 104/64   Pulse 63   Wt 201 lb 8 oz (91.4 kg)   SpO2 100%   BMI 27.33 kg/m    Wt Readings from Last 3 Encounters:  12/17/16 201 lb 8 oz (91.4 kg)  11/27/16 198 lb (89.8 kg)  11/12/16 199 lb 12.8 oz (90.6 kg)     General: NAD Neck: JVP 7 cm, no thyromegaly or thyroid nodule. Lungs: CTAB, normal effort CV: Nondisplaced PMI.  Heart regular S1/S2, no S3/S4, no murmur.  No carotid bruit.  Normal pedal pulses.  Abdomen: Soft, non-tender, non-distended, no HSM. No bruits or masses. +BS  Skin: Intact without lesions or rashes Neurologic: Alert and oriented x 3.  Psych: Normal affect. Extremities: No clubbing or cyanosis. 1+ right ankle edema (veins harvested on right). Prominent tophi noted on fingers and right calf.  HEENT: Normal.   Assessment/Plan: 1. Chronic systolic CHF: Ischemic cardiomyopathy.  EF 25% in 2013, EF 20-25% on 10/16 echo with evidence for RV volume overload.  Most recent echo was done in 9/17, showing EF 15%, mildly dilated RV with mild to moderately decreased RV systolic function.  Severe functional deficit by CPX in 12/17, worse than prior study.  He has a Secondary school teachert Jude ICD.  He is not volume overloaded on exam, stable NYHA class III symptoms.  RHC in 2/18  showed only mildly elevated filling pressures and preserved cardiac output.   - Keep torsemide at 10 mg qod for now.  Check BMET/BNP today.   - Continue Entresto 24/26 bid, would not titrate up with soft BP and elevated creatinine.   - Continue current Coreg 25 mg twice a day .  - Continue Bidil 1.5 tabs tid, no BP room to titrate up.    - Continue spironolactone 12.5 daily but will check K today.  - Continue digoxin 0.125 daily, check level. - RBBB, so not good candidate for CRT upgrade.  - [name]  has prominent fatigue as well as weight loss/early satiety that may signify low cardiac output in the setting of ongoing low EF (15%) and poor CPX in 12/17.  His creatinine is elevated suggestive of cardiorenal syndrome.  RHC in 12/17 showed preserved cardiac output and symptoms are stable, so I did not feel like we needed to move right away towards LVAD.  I am going to refer him for transplant evaluation to [facility].  If he decompensates in the future, LVAD will remain a possible option.  2. Gout: Severe tophaceous gout.  - On Uloric.  Seeing Dr Dierdre Forth now.  3. CKD stage III: May be related to HTN, diabetes, hyperuricemia.  However, cardiorenal syndrome is likely playing a role as well.  Creatinine had trended up a bit recently.  I cut back on torsemide as he has not appeared volume overloaded.  BMET today.   4. CAD: s/p CABG.  No chest pain.  Continue ASA 81 and atorvastatin.  I had planned to do coronary angiography at time of recent RHC.  However, with rise in creatinine and lack of chest pain symptoms I held off on giving him the contrast load.   Followup in 2 months.   Marca Ancona 12/17/2016

## 2016-12-17 NOTE — Patient Instructions (Signed)
Follow up with Dr.McLean in 3 months.  

## 2016-12-18 ENCOUNTER — Telehealth (HOSPITAL_COMMUNITY): Payer: Self-pay | Admitting: *Deleted

## 2016-12-18 DIAGNOSIS — I5022 Chronic systolic (congestive) heart failure: Secondary | ICD-10-CM

## 2016-12-18 MED ORDER — DIGOXIN 125 MCG PO TABS: 0.0625 mg | ORAL_TABLET | Freq: Every day | ORAL | 3 refills | Status: DC

## 2016-12-18 MED ORDER — TORSEMIDE 20 MG PO TABS: 10.0000 mg | ORAL_TABLET | Freq: Every day | ORAL | 3 refills | Status: DC

## 2016-12-18 NOTE — Telephone Encounter (Signed)
Notes Recorded by Laurey Moralealton S McLean, MD on 12/17/2016 at 4:39 PM EST Decrease digoxin to 0.0625 daily. Stop spironolactone due to elevated K (as long as he is not taking any K supplements). He probably also needs to take his torsemide 10 mg daily rather than every other day. Repeat BMET + digoxin level on Monday (do digoxin level as trough).    Ref Range & Units 1d ago 3wk ago 65mo ago   Sodium 135 - 145 mmol/L 137  133   132     Potassium 3.5 - 5.1 mmol/L 5.6   5.0  5.6     Chloride 101 - 111 mmol/L 109  107  108    CO2 22 - 32 mmol/L 20   17   18      Glucose, Bld 65 - 99 mg/dL 82  79  80    BUN 6 - 20 mg/dL 44   66   63     Creatinine, Ser 0.61 - 1.24 mg/dL 9.141.54   7.821.93   9.561.78     Calcium 8.9 - 10.3 mg/dL 9.0  8.7   9.0    GFR calc non Af Amer >60 mL/min 50   38   42     GFR calc Af Amer >60 mL/min 58   44CM   49CM

## 2016-12-23 ENCOUNTER — Ambulatory Visit (HOSPITAL_COMMUNITY)
Admission: RE | Admit: 2016-12-23 | Discharge: 2016-12-23 | Disposition: A | Discharge status: Home / Self Care | Payer: Medicare PPO | Source: Ambulatory Visit | Attending: Cardiology | Admitting: Cardiology

## 2016-12-23 DIAGNOSIS — I5022 Chronic systolic (congestive) heart failure: Secondary | ICD-10-CM | POA: Diagnosis present

## 2016-12-23 LAB — BASIC METABOLIC PANEL
ANION GAP: 5 (ref 5–15)
BUN: 47 mg/dL — ABNORMAL HIGH (ref 6–20)
CALCIUM: 8.6 mg/dL — AB (ref 8.9–10.3)
CHLORIDE: 109 mmol/L (ref 101–111)
CO2: 21 mmol/L — ABNORMAL LOW (ref 22–32)
CREATININE: 1.67 mg/dL — AB (ref 0.61–1.24)
GFR calc non Af Amer: 45 mL/min — ABNORMAL LOW (ref >60)
GFR, EST AFRICAN AMERICAN: 52 mL/min — AB (ref >60)
Glucose, Bld: 58 mg/dL — ABNORMAL LOW (ref 65–99)
Potassium: 4.8 mmol/L (ref 3.5–5.1)
SODIUM: 135 mmol/L (ref 135–145)

## 2016-12-23 LAB — DIGOXIN LEVEL: DIGOXIN LVL: 0.6 ng/mL — AB (ref 0.8–2.0)

## 2017-01-08 ENCOUNTER — Ambulatory Visit (INDEPENDENT_AMBULATORY_CARE_PROVIDER_SITE_OTHER): Payer: Medicare PPO | Admitting: *Deleted

## 2017-01-08 DIAGNOSIS — I255 Ischemic cardiomyopathy: Secondary | ICD-10-CM | POA: Diagnosis not present

## 2017-01-08 NOTE — Progress Notes (Signed)
Remote ICD transmission.   

## 2017-01-09 ENCOUNTER — Encounter: Payer: Self-pay | Admitting: Cardiology

## 2017-01-09 LAB — CUP PACEART REMOTE DEVICE CHECK
Battery Remaining Longevity: 95 mo
Battery Remaining Percentage: 89 %
Brady Statistic RV Percent Paced: 1 %
HIGH POWER IMPEDANCE MEASURED VALUE: 53 Ohm
HIGH POWER IMPEDANCE MEASURED VALUE: 53 Ohm
Implantable Lead Implant Date: 20161216
Implantable Pulse Generator Implant Date: 20161216
Lead Channel Impedance Value: 350 Ohm
Lead Channel Pacing Threshold Amplitude: 0.5 V
Lead Channel Setting Pacing Amplitude: 2.5 V
MDC IDC LEAD LOCATION: 753860
MDC IDC MSMT BATTERY VOLTAGE: 3.08 V
MDC IDC MSMT LEADCHNL RV PACING THRESHOLD PULSEWIDTH: 0.5 ms
MDC IDC MSMT LEADCHNL RV SENSING INTR AMPL: 7.6 mV
MDC IDC PG SERIAL: 7308121
MDC IDC SESS DTM: 20180321060017
MDC IDC SET LEADCHNL RV PACING PULSEWIDTH: 0.5 ms
MDC IDC SET LEADCHNL RV SENSING SENSITIVITY: 0.5 mV

## 2017-01-15 ENCOUNTER — Telehealth: Payer: Self-pay | Admitting: Cardiology

## 2017-01-15 NOTE — Telephone Encounter (Signed)
Left detailed message, DPR on file, explaining that I cannot find who called him or why. Advised pt to call office if he has any concerns, otherwise whoever tried to contact him will hopefully attempt again.

## 2017-01-15 NOTE — Telephone Encounter (Signed)
Follow Up:    Pt said he was returning your call,no other information was given.

## 2017-02-18 ENCOUNTER — Encounter (HOSPITAL_COMMUNITY): Payer: Self-pay

## 2017-02-18 ENCOUNTER — Ambulatory Visit (HOSPITAL_COMMUNITY)
Admission: RE | Admit: 2017-02-18 | Discharge: 2017-02-18 | Disposition: A | Discharge status: Home / Self Care | Payer: Medicare PPO | Source: Ambulatory Visit | Attending: Cardiology | Admitting: Cardiology

## 2017-02-18 VITALS — BP 98/56 | HR 69 | Wt 206.5 lb

## 2017-02-18 DIAGNOSIS — I10 Essential (primary) hypertension: Secondary | ICD-10-CM

## 2017-02-18 DIAGNOSIS — E785 Hyperlipidemia, unspecified: Secondary | ICD-10-CM | POA: Insufficient documentation

## 2017-02-18 DIAGNOSIS — Z951 Presence of aortocoronary bypass graft: Secondary | ICD-10-CM | POA: Insufficient documentation

## 2017-02-18 DIAGNOSIS — Z7984 Long term (current) use of oral hypoglycemic drugs: Secondary | ICD-10-CM | POA: Diagnosis not present

## 2017-02-18 DIAGNOSIS — I2510 Atherosclerotic heart disease of native coronary artery without angina pectoris: Secondary | ICD-10-CM | POA: Diagnosis not present

## 2017-02-18 DIAGNOSIS — I5084 End stage heart failure: Secondary | ICD-10-CM | POA: Diagnosis not present

## 2017-02-18 DIAGNOSIS — I255 Ischemic cardiomyopathy: Secondary | ICD-10-CM | POA: Insufficient documentation

## 2017-02-18 DIAGNOSIS — E1122 Type 2 diabetes mellitus with diabetic chronic kidney disease: Secondary | ICD-10-CM | POA: Diagnosis not present

## 2017-02-18 DIAGNOSIS — I4510 Unspecified right bundle-branch block: Secondary | ICD-10-CM | POA: Diagnosis not present

## 2017-02-18 DIAGNOSIS — I5022 Chronic systolic (congestive) heart failure: Secondary | ICD-10-CM | POA: Diagnosis not present

## 2017-02-18 DIAGNOSIS — R972 Elevated prostate specific antigen [PSA]: Secondary | ICD-10-CM | POA: Insufficient documentation

## 2017-02-18 DIAGNOSIS — Z95810 Presence of automatic (implantable) cardiac defibrillator: Secondary | ICD-10-CM | POA: Insufficient documentation

## 2017-02-18 DIAGNOSIS — M109 Gout, unspecified: Secondary | ICD-10-CM | POA: Diagnosis not present

## 2017-02-18 DIAGNOSIS — I130 Hypertensive heart and chronic kidney disease with heart failure and stage 1 through stage 4 chronic kidney disease, or unspecified chronic kidney disease: Secondary | ICD-10-CM | POA: Insufficient documentation

## 2017-02-18 DIAGNOSIS — N183 Chronic kidney disease, stage 3 unspecified: Secondary | ICD-10-CM

## 2017-02-18 LAB — BASIC METABOLIC PANEL
ANION GAP: 8 (ref 5–15)
BUN: 61 mg/dL — AB (ref 6–20)
CHLORIDE: 105 mmol/L (ref 101–111)
CO2: 22 mmol/L (ref 22–32)
Calcium: 8.5 mg/dL — ABNORMAL LOW (ref 8.9–10.3)
Creatinine, Ser: 1.56 mg/dL — ABNORMAL HIGH (ref 0.61–1.24)
GFR calc Af Amer: 57 mL/min — ABNORMAL LOW (ref >60)
GFR, EST NON AFRICAN AMERICAN: 49 mL/min — AB (ref >60)
GLUCOSE: 112 mg/dL — AB (ref 65–99)
POTASSIUM: 4.3 mmol/L (ref 3.5–5.1)
SODIUM: 135 mmol/L (ref 135–145)

## 2017-02-18 LAB — DIGOXIN LEVEL: DIGOXIN LVL: 0.8 ng/mL (ref 0.8–2.0)

## 2017-02-18 MED ORDER — NITROGLYCERIN 0.4 MG SL SUBL: 0.4000 mg | SUBLINGUAL_TABLET | SUBLINGUAL | 2 refills | Status: AC | PRN

## 2017-02-18 NOTE — Patient Instructions (Signed)
Routine lab work today. Will notify you of abnormal results  Follow up with Dr.McLean in 1 month.   

## 2017-02-19 NOTE — Progress Notes (Signed)
Patient ID: James [name], male   DOB: 08-23-1961, 56 y.o.   MRN: [id number]     Advanced Heart Failure Clinic Note   PCP: Dr. Michel Santee Rheumatology:  Dr. Dierdre Forth HF Cardiology: Dr. Shirlee Latch  56 yo with history of gout, DM2, HTN, CKD, CAD s/p CABG, and chronic systolic CHF presents for CHF clinic evaluation.  He had CABG x 6 in 2/12.  He has a long-standing ischemic cardiomyopathy, echo in 10/16 with EF 20-25%.  He also has severe tophaceous gout.     Now s/p St Jude ICD 10/06/15.  9/17 echo with EF 15%, mild to moderately decreased RV systolic function.  CPX in 12/17 showed severe HF limitation.  RHC in 12/17 showed mildly elevated filling pressures with preserved cardiac output.   Patient was seen at [facility] 4/18 for transplant evaluation.  RHC showed CI remained preserved at 3.2.  He was thought to be a reasonable candidate except PSA was noted to be up to 30 from 0.3 in 1/18.  He does not have prostatitis-type pain.    Currently stable symptomatically.  He walks on a track for exercise.  He is short of breath with steps, generally ok on flat ground.  No chest pain.  No lightheadedness/dizziness.  He is off spironolactone due to elevated potassium.  Uric acid has been decreasing on Uloric.   ECG: NSR, RBBB, 1st degree AVB  Labs (8/16): HCT 38.9, K 4.9, creatinine 1.81 Labs (08/01/2015) : K 3.7 Creatinine 1.49, BNP 658, LDL 85, HDL 65 Labs (09/18/2015): K 5.1 Creatinine 1.5 Labs (02/23/2016) K 4.6, Creatinine 1.58 Labs (03/01/2016): K 3.8, Creatinine 1.53 Labs (7/17): K 4.3, creatinine 1.53 Labs (9/17): K 4, creatinine 1.75, hgb 10.9 Labs (10/17): K 4.6, creatinine 1.6, digoxin 0.9 Labs (1/18): hgb 9.7, creatinine 1.78, BUN 63, K 5.6, albumin 3.4, AST/ALT normal, PSA 0.3 Labs (2/18): K 5, creatinine 1.93 Labs (3/18): K 4.8, creatinine 1.67, digoxin 0.6 Labs (4/18): K 4.2, creatinine 1.8, PSA 30  PMH: 1. Tophaceous gout: Sees Dr Dierdre Forth.  2. Hyperlipidemia 3. CKD 4. Type II diabetes 5.  CAD: s/p CABG 2/12 with sequential LIMA-mid and distal LAD, sequential SVG to OM1/2/3, SVG-dRCA.   6. Chronic systolic CHF: Ischemic cardiomyopathy. Echo 2013 with EF 25%.  Echo (10/16) with EF 15-20%, restrictive diastolic function, moderately dilated RV with flattened interventricular septum, mild MR.   - CPX (10/16) with peak VO2 11.5, VE/VCO2 slope 33.6, RER 1.19 => moderate to severe HF limitation.  - Echo (9/17): EF 15% with mild LV dilation, mild RV dilation with mild to moderately decreased systolic function, moderate MR, PASP 69 mmHg, IVC dilated.  - St Jude ICD.  - Echo (9/17) with EF 15%, mild LV dilation, diffuse hypokinesis, mildly dilated RV with mild to moderately decreased systolic function, PASP 69 mmHg.  - RHC (9/17) with mean RA 14, PA 70/35 mean 49, mean PCWP 24, RA/PCWP 0.5, CI 2.14 Fick/2.17 thermo, PVR 5.  - CPX (12/17) with peak VO2 8.9 (26% predicted), VE/VCO2 slope 51, RER 1.12 => severe HF limitation.  - RHC (12/17): mean RA 7, PA 62/22 mean 38, mean PCWP 18, CI 3.19 Fick/PVR 2.9 WU, CI 3.56 thermo/PVR 2.65 WU.  - RHC (4/18): mean RA 8, mean PCWP 18, PVR 3 WU, CI 3.2  SH: Lives in [place], nonsmoker, truck Merchant navy officer.    FH: HTN.  Father with CVA.   ROS: All systems reviewed and negative except as per HPI.   Current Outpatient Prescriptions  Medication Sig  Dispense Refill  . acetaminophen (TYLENOL) 325 MG tablet Take 325 mg by mouth every 6 (six) hours as needed for moderate pain or headache.    Marland Kitchen aspirin EC 81 MG tablet Take 81 mg by mouth daily.    Marland Kitchen atorvastatin (LIPITOR) 80 MG tablet TAKE ONE TABLET BY MOUTH DAILY 90 tablet 3  . carvedilol (COREG) 25 MG tablet Take 1 tablet (25 mg total) by mouth 2 (two) times daily with a meal. Please schedule appointment for refills. 180 tablet 3  . colchicine 0.6 MG tablet Take 0.6 mg by mouth daily as needed (gout). Reported on 02/21/2016    . digoxin (LANOXIN) 0.125 MG tablet Take 0.5 tablets (0.0625 mg total) by  mouth daily. 15 tablet 3  . ENTRESTO 49-51 MG Take 0.5 tablets by mouth 2 (two) times daily.    . febuxostat (ULORIC) 40 MG tablet Take 120 mg by mouth daily.    Marland Kitchen glipiZIDE (GLUCOTROL) 5 MG tablet TAKE 1 TABLET (5 MG TOTAL) BY MOUTH 2 (TWO) TIMES DAILY BEFORE MEALS (REFER FUTURE REFILLS TO PRIMARY CARE PHYSICIAN) 180 tablet 0  . isosorbide-hydrALAZINE (BIDIL) 20-37.5 MG tablet Take 1.5 tablets by mouth 3 (three) times daily. 405 tablet 3  . torsemide (DEMADEX) 20 MG tablet Take 20-30 mg by mouth as directed. Take 1 tablet every other day alternating with 1 &1/2 tablets every other day    . nitroGLYCERIN (NITROSTAT) 0.4 MG SL tablet Place 1 tablet (0.4 mg total) under the tongue every 5 (five) minutes as needed for chest pain. 25 tablet 2   No current facility-administered medications for this encounter.    BP (!) 98/56   Pulse 69   Wt 206 lb 8 oz (93.7 kg)   SpO2 99%   BMI 28.01 kg/m    Wt Readings from Last 3 Encounters:  02/18/17 206 lb 8 oz (93.7 kg)  12/17/16 201 lb 8 oz (91.4 kg)  11/27/16 198 lb (89.8 kg)     General: NAD Neck: JVP not elevated, no thyromegaly or thyroid nodule. Lungs: CTAB, normal effort CV: Nondisplaced PMI.  Heart regular S1/S2, no S3/S4, no murmur.  No carotid bruit.  Normal pedal pulses.  Abdomen: Soft, non-tender, non-distended, no HSM. No bruits or masses. +BS  Skin: Intact without lesions or rashes Neurologic: Alert and oriented x 3.  Psych: Normal affect. Extremities: No clubbing or cyanosis. 1+ edema 1/2 to knees bilaterally. Prominent tophi noted on fingers and right calf.  HEENT: Normal.   Assessment/Plan: 1. Chronic systolic CHF: Ischemic cardiomyopathy.  EF 25% in 2013, EF 20-25% on 10/16 echo with evidence for RV volume overload.  Most recent echo was done in 9/17, showing EF 15%, mildly dilated RV with mild to moderately decreased RV systolic function.  Severe functional deficit by CPX in 12/17, worse than prior study.  He has a Secondary school teacher ICD.   He is not volume overloaded on exam, stable NYHA class III symptoms.  RHC in 12/17 and again in 4/18 at [facility] showed only mildly elevated filling pressures and preserved cardiac output.   - Keep torsemide at 20 mg daily alternating with 30 mg daily.  BMET today.   - Continue Entresto 24/26 bid, would not titrate up with soft BP and elevated creatinine.   - Continue current Coreg 25 mg twice a day .  - Continue Bidil 1.5 tabs tid, no BP room to titrate up.    - He is off spironolactone given elevated K.   - Continue digoxin  0.0625 daily, check level. - RBBB, so not good candidate for CRT upgrade.  - Mr Schriver is moving towards end stage CHF with cardiorenal syndrome.  He has been evaluated for [facility] at [facility], but PSA was found to be elevated and he will be seeing his urologist.  In the mean time, LVAD remains an option if he were to decompensate.  He has undergone workup for LVAD already.  We will need to be careful not to miss potentially narrow window with renal function.  2. Gout: Severe tophaceous gout.  - On Uloric.  Seeing Dr Dierdre Forth now.  3. CKD stage III: May be related to HTN, diabetes, hyperuricemia.  However, cardiorenal syndrome is likely playing a role as well.  Creatinine has been fairly stable, recheck today.  4. CAD: s/p CABG in 2012.  No chest pain.  Continue ASA 81 and atorvastatin.  5. Elevated PSA: PSA rose form 0.3 in 1/18 to 30 in 4/18.  No history of prostate cancer.  ?Prostatitis but not symptomatic.  He has an appointment with a urologist in [place].   Followup in 1 month.   Marca Ancona 02/19/2017

## 2017-02-20 ENCOUNTER — Other Ambulatory Visit (HOSPITAL_COMMUNITY): Payer: Self-pay | Admitting: Cardiology

## 2017-02-20 MED ORDER — DIGOXIN 125 MCG PO TABS
0.0625 mg | ORAL_TABLET | Freq: Every day | ORAL | 3 refills | Status: AC
Start: 2017-02-20 — End: ?

## 2017-03-01 ENCOUNTER — Encounter (HOSPITAL_COMMUNITY): Payer: Self-pay | Admitting: Emergency Medicine

## 2017-03-01 ENCOUNTER — Inpatient Hospital Stay (HOSPITAL_COMMUNITY)
Admission: EM | Admit: 2017-03-01 | Discharge: 2017-03-03 | DRG: 554 | Disposition: A | Discharge status: Home / Self Care | Payer: Medicare Other | Attending: Internal Medicine | Admitting: Internal Medicine

## 2017-03-01 DIAGNOSIS — E860 Dehydration: Secondary | ICD-10-CM | POA: Diagnosis present

## 2017-03-01 DIAGNOSIS — N183 Chronic kidney disease, stage 3 unspecified: Secondary | ICD-10-CM | POA: Diagnosis present

## 2017-03-01 DIAGNOSIS — Z7984 Long term (current) use of oral hypoglycemic drugs: Secondary | ICD-10-CM

## 2017-03-01 DIAGNOSIS — I429 Cardiomyopathy, unspecified: Secondary | ICD-10-CM | POA: Diagnosis not present

## 2017-03-01 DIAGNOSIS — I255 Ischemic cardiomyopathy: Secondary | ICD-10-CM | POA: Diagnosis present

## 2017-03-01 DIAGNOSIS — M1A9XX1 Chronic gout, unspecified, with tophus (tophi): Principal | ICD-10-CM | POA: Diagnosis present

## 2017-03-01 DIAGNOSIS — D72829 Elevated white blood cell count, unspecified: Secondary | ICD-10-CM | POA: Diagnosis not present

## 2017-03-01 DIAGNOSIS — Z951 Presence of aortocoronary bypass graft: Secondary | ICD-10-CM

## 2017-03-01 DIAGNOSIS — Z95810 Presence of automatic (implantable) cardiac defibrillator: Secondary | ICD-10-CM | POA: Diagnosis not present

## 2017-03-01 DIAGNOSIS — R197 Diarrhea, unspecified: Secondary | ICD-10-CM | POA: Diagnosis present

## 2017-03-01 DIAGNOSIS — M109 Gout, unspecified: Secondary | ICD-10-CM

## 2017-03-01 DIAGNOSIS — D638 Anemia in other chronic diseases classified elsewhere: Secondary | ICD-10-CM | POA: Diagnosis not present

## 2017-03-01 DIAGNOSIS — E871 Hypo-osmolality and hyponatremia: Secondary | ICD-10-CM | POA: Diagnosis not present

## 2017-03-01 DIAGNOSIS — I10 Essential (primary) hypertension: Secondary | ICD-10-CM

## 2017-03-01 DIAGNOSIS — I5084 End stage heart failure: Secondary | ICD-10-CM | POA: Diagnosis not present

## 2017-03-01 DIAGNOSIS — E1129 Type 2 diabetes mellitus with other diabetic kidney complication: Secondary | ICD-10-CM | POA: Diagnosis present

## 2017-03-01 DIAGNOSIS — I130 Hypertensive heart and chronic kidney disease with heart failure and stage 1 through stage 4 chronic kidney disease, or unspecified chronic kidney disease: Secondary | ICD-10-CM | POA: Diagnosis present

## 2017-03-01 DIAGNOSIS — Z8249 Family history of ischemic heart disease and other diseases of the circulatory system: Secondary | ICD-10-CM

## 2017-03-01 DIAGNOSIS — R972 Elevated prostate specific antigen [PSA]: Secondary | ICD-10-CM | POA: Diagnosis not present

## 2017-03-01 DIAGNOSIS — N289 Disorder of kidney and ureter, unspecified: Secondary | ICD-10-CM

## 2017-03-01 DIAGNOSIS — I5022 Chronic systolic (congestive) heart failure: Secondary | ICD-10-CM

## 2017-03-01 DIAGNOSIS — E861 Hypovolemia: Secondary | ICD-10-CM | POA: Diagnosis not present

## 2017-03-01 DIAGNOSIS — M1000 Idiopathic gout, unspecified site: Secondary | ICD-10-CM | POA: Diagnosis not present

## 2017-03-01 DIAGNOSIS — E1122 Type 2 diabetes mellitus with diabetic chronic kidney disease: Secondary | ICD-10-CM

## 2017-03-01 DIAGNOSIS — T504X5A Adverse effect of drugs affecting uric acid metabolism, initial encounter: Secondary | ICD-10-CM | POA: Diagnosis not present

## 2017-03-01 DIAGNOSIS — Z833 Family history of diabetes mellitus: Secondary | ICD-10-CM

## 2017-03-01 DIAGNOSIS — N179 Acute kidney failure, unspecified: Secondary | ICD-10-CM

## 2017-03-01 DIAGNOSIS — I2510 Atherosclerotic heart disease of native coronary artery without angina pectoris: Secondary | ICD-10-CM | POA: Diagnosis present

## 2017-03-01 DIAGNOSIS — Z87891 Personal history of nicotine dependence: Secondary | ICD-10-CM

## 2017-03-01 DIAGNOSIS — E1165 Type 2 diabetes mellitus with hyperglycemia: Secondary | ICD-10-CM | POA: Diagnosis present

## 2017-03-01 DIAGNOSIS — Z888 Allergy status to other drugs, medicaments and biological substances status: Secondary | ICD-10-CM

## 2017-03-01 DIAGNOSIS — E878 Other disorders of electrolyte and fluid balance, not elsewhere classified: Secondary | ICD-10-CM | POA: Diagnosis present

## 2017-03-01 DIAGNOSIS — I252 Old myocardial infarction: Secondary | ICD-10-CM

## 2017-03-01 DIAGNOSIS — Z7682 Awaiting organ transplant status: Secondary | ICD-10-CM

## 2017-03-01 DIAGNOSIS — E119 Type 2 diabetes mellitus without complications: Secondary | ICD-10-CM

## 2017-03-01 DIAGNOSIS — Z79899 Other long term (current) drug therapy: Secondary | ICD-10-CM

## 2017-03-01 LAB — CBC WITH DIFFERENTIAL/PLATELET
Basophils Absolute: 0 10*3/uL (ref 0.0–0.1)
Basophils Relative: 0 %
EOS ABS: 0 10*3/uL (ref 0.0–0.7)
EOS PCT: 0 %
HCT: 24.9 % — ABNORMAL LOW (ref 39.0–52.0)
Hemoglobin: 8 g/dL — ABNORMAL LOW (ref 13.0–17.0)
LYMPHS ABS: 0.9 10*3/uL (ref 0.7–4.0)
Lymphocytes Relative: 7 %
MCH: 26.8 pg (ref 26.0–34.0)
MCHC: 32.1 g/dL (ref 30.0–36.0)
MCV: 83.6 fL (ref 78.0–100.0)
MONO ABS: 0.5 10*3/uL (ref 0.1–1.0)
Monocytes Relative: 4 %
Neutro Abs: 11.5 10*3/uL — ABNORMAL HIGH (ref 1.7–7.7)
Neutrophils Relative %: 89 %
PLATELETS: 290 10*3/uL (ref 150–400)
RBC: 2.98 MIL/uL — AB (ref 4.22–5.81)
RDW: 16 % — AB (ref 11.5–15.5)
WBC: 12.9 10*3/uL — ABNORMAL HIGH (ref 4.0–10.5)

## 2017-03-01 LAB — COMPREHENSIVE METABOLIC PANEL
ALT: 20 U/L (ref 17–63)
ANION GAP: 11 (ref 5–15)
AST: 41 U/L (ref 15–41)
Albumin: 2.4 g/dL — ABNORMAL LOW (ref 3.5–5.0)
Alkaline Phosphatase: 113 U/L (ref 38–126)
BILIRUBIN TOTAL: 1 mg/dL (ref 0.3–1.2)
BUN: 92 mg/dL — ABNORMAL HIGH (ref 6–20)
CHLORIDE: 97 mmol/L — AB (ref 101–111)
CO2: 18 mmol/L — ABNORMAL LOW (ref 22–32)
Calcium: 8.2 mg/dL — ABNORMAL LOW (ref 8.9–10.3)
Creatinine, Ser: 2.28 mg/dL — ABNORMAL HIGH (ref 0.61–1.24)
GFR calc Af Amer: 36 mL/min — ABNORMAL LOW (ref >60)
GFR, EST NON AFRICAN AMERICAN: 31 mL/min — AB (ref >60)
Glucose, Bld: 229 mg/dL — ABNORMAL HIGH (ref 65–99)
POTASSIUM: 4.1 mmol/L (ref 3.5–5.1)
Sodium: 126 mmol/L — ABNORMAL LOW (ref 135–145)
TOTAL PROTEIN: 6 g/dL — AB (ref 6.5–8.1)

## 2017-03-01 LAB — URINALYSIS, ROUTINE W REFLEX MICROSCOPIC
Bilirubin Urine: NEGATIVE
GLUCOSE, UA: NEGATIVE mg/dL
Hgb urine dipstick: NEGATIVE
KETONES UR: NEGATIVE mg/dL
Leukocytes, UA: NEGATIVE
Nitrite: NEGATIVE
PH: 5 (ref 5.0–8.0)
PROTEIN: NEGATIVE mg/dL
Specific Gravity, Urine: 1.014 (ref 1.005–1.030)

## 2017-03-01 LAB — SODIUM, URINE, RANDOM: Sodium, Ur: 18 mmol/L

## 2017-03-01 LAB — GLUCOSE, CAPILLARY: Glucose-Capillary: 166 mg/dL — ABNORMAL HIGH (ref 65–99)

## 2017-03-01 LAB — URIC ACID: URIC ACID, SERUM: 9.9 mg/dL — AB (ref 4.4–7.6)

## 2017-03-01 LAB — LACTIC ACID, PLASMA: Lactic Acid, Venous: 1.2 mmol/L (ref 0.5–1.9)

## 2017-03-01 LAB — OSMOLALITY, URINE: OSMOLALITY UR: 338 mosm/kg (ref 300–900)

## 2017-03-01 LAB — DIGOXIN LEVEL: DIGOXIN LVL: 0.4 ng/mL — AB (ref 0.8–2.0)

## 2017-03-01 LAB — CREATININE, URINE, RANDOM: Creatinine, Urine: 138.85 mg/dL

## 2017-03-01 MED ORDER — OXYCODONE-ACETAMINOPHEN 5-325 MG PO TABS
1.0000 | ORAL_TABLET | ORAL | Status: AC | PRN
  Administered 2017-03-02: 1 via ORAL
  Filled 2017-03-01: qty 1

## 2017-03-01 MED ORDER — ASPIRIN EC 81 MG PO TBEC
81.0000 mg | DELAYED_RELEASE_TABLET | Freq: Every day | ORAL | Status: DC
  Administered 2017-03-02 – 2017-03-03 (×2): 81 mg via ORAL
  Filled 2017-03-01 (×2): qty 1

## 2017-03-01 MED ORDER — ENOXAPARIN SODIUM 40 MG/0.4ML ~~LOC~~ SOLN
40.0000 mg | SUBCUTANEOUS | Status: DC
  Administered 2017-03-02 (×2): 40 mg via SUBCUTANEOUS
  Filled 2017-03-01 (×2): qty 0.4

## 2017-03-01 MED ORDER — ONDANSETRON HCL 4 MG/2ML IJ SOLN
4.0000 mg | Freq: Four times a day (QID) | INTRAMUSCULAR | Status: DC | PRN
  Filled 2017-03-01: qty 2

## 2017-03-01 MED ORDER — SODIUM CHLORIDE 0.9% FLUSH: 3.0000 mL | INTRAVENOUS | Status: DC | PRN

## 2017-03-01 MED ORDER — SODIUM CHLORIDE 0.9 % IV SOLN
INTRAVENOUS | Status: AC
  Administered 2017-03-01: via INTRAVENOUS

## 2017-03-01 MED ORDER — ACETAMINOPHEN 650 MG RE SUPP: 650.0000 mg | Freq: Four times a day (QID) | RECTAL | Status: DC | PRN

## 2017-03-01 MED ORDER — SODIUM CHLORIDE 0.9 % IV BOLUS (SEPSIS): 1000.0000 mL | Freq: Once | INTRAVENOUS | Status: DC

## 2017-03-01 MED ORDER — PREDNISONE 20 MG PO TABS
60.0000 mg | ORAL_TABLET | Freq: Once | ORAL | Status: AC
  Administered 2017-03-01: 60 mg via ORAL
  Filled 2017-03-01: qty 3

## 2017-03-01 MED ORDER — INSULIN ASPART 100 UNIT/ML ~~LOC~~ SOLN
0.0000 [IU] | Freq: Every day | SUBCUTANEOUS | Status: DC
  Administered 2017-03-02: 2 [IU] via SUBCUTANEOUS

## 2017-03-01 MED ORDER — FEBUXOSTAT 40 MG PO TABS
120.0000 mg | ORAL_TABLET | Freq: Every day | ORAL | Status: DC
  Administered 2017-03-02 – 2017-03-03 (×2): 120 mg via ORAL
  Filled 2017-03-01 (×2): qty 3

## 2017-03-01 MED ORDER — INSULIN ASPART 100 UNIT/ML ~~LOC~~ SOLN
0.0000 [IU] | Freq: Three times a day (TID) | SUBCUTANEOUS | Status: DC
  Administered 2017-03-02 – 2017-03-03 (×5): 3 [IU] via SUBCUTANEOUS

## 2017-03-01 MED ORDER — ONDANSETRON HCL 4 MG PO TABS: 4.0000 mg | ORAL_TABLET | Freq: Four times a day (QID) | ORAL | Status: DC | PRN

## 2017-03-01 MED ORDER — SODIUM CHLORIDE 0.9 % IV SOLN: 250.0000 mL | INTRAVENOUS | Status: DC | PRN

## 2017-03-01 MED ORDER — SODIUM CHLORIDE 0.9% FLUSH
3.0000 mL | Freq: Two times a day (BID) | INTRAVENOUS | Status: DC
  Administered 2017-03-02 – 2017-03-03 (×3): 3 mL via INTRAVENOUS

## 2017-03-01 MED ORDER — OXYCODONE-ACETAMINOPHEN 5-325 MG PO TABS
1.0000 | ORAL_TABLET | ORAL | Status: DC | PRN
  Administered 2017-03-01: 1 via ORAL
  Filled 2017-03-01: qty 1

## 2017-03-01 MED ORDER — ISOSORB DINITRATE-HYDRALAZINE 20-37.5 MG PO TABS
1.5000 | ORAL_TABLET | Freq: Three times a day (TID) | ORAL | Status: DC
  Administered 2017-03-02 (×4): 1.5 via ORAL
  Filled 2017-03-01 (×4): qty 2

## 2017-03-01 MED ORDER — DIGOXIN 125 MCG PO TABS
0.0625 mg | ORAL_TABLET | Freq: Every day | ORAL | Status: DC
  Administered 2017-03-02 – 2017-03-03 (×2): 0.0625 mg via ORAL
  Filled 2017-03-01 (×2): qty 1

## 2017-03-01 MED ORDER — ACETAMINOPHEN 325 MG PO TABS: 650.0000 mg | ORAL_TABLET | Freq: Four times a day (QID) | ORAL | Status: DC | PRN

## 2017-03-01 MED ORDER — ATORVASTATIN CALCIUM 80 MG PO TABS
80.0000 mg | ORAL_TABLET | Freq: Every day | ORAL | Status: DC
  Administered 2017-03-02 (×2): 80 mg via ORAL
  Filled 2017-03-01 (×2): qty 1

## 2017-03-01 MED ORDER — CARVEDILOL 12.5 MG PO TABS
25.0000 mg | ORAL_TABLET | Freq: Two times a day (BID) | ORAL | Status: DC
  Administered 2017-03-02 – 2017-03-03 (×3): 25 mg via ORAL
  Filled 2017-03-01 (×3): qty 2

## 2017-03-01 MED ORDER — SODIUM CHLORIDE 0.9% FLUSH
3.0000 mL | Freq: Two times a day (BID) | INTRAVENOUS | Status: DC
  Administered 2017-03-02: 3 mL via INTRAVENOUS

## 2017-03-01 MED ORDER — PREDNISONE 20 MG PO TABS
60.0000 mg | ORAL_TABLET | Freq: Every day | ORAL | Status: DC
  Administered 2017-03-02 – 2017-03-03 (×2): 60 mg via ORAL
  Filled 2017-03-01 (×2): qty 3

## 2017-03-01 NOTE — Consult Note (Signed)
CARDIOLOGY CONSULT NOTE   Referring Physician: Dr. Adela Glimpse, [facility] Primary Physician: Dr. Michel Santee Primary Cardiologist: Dr. Shirlee Latch Reason for Consultation: Acute kidney injury and gout flare in heart failure  HPI: James [name] is a 41 yp man with chronic systolic heart failure (prior transplant eval at [facility]) 2/2 ICM (last EF 20-25%), CAD s/p CABG, gout, DM2, HTN, and CKD who presented with severe gout. Cardiology was consulted at the request of Dr. Adela Glimpse for assistance with medication management given acute kidney injury.  Patient reports that one week ago he began to have severe bilateral gout symptoms. He took colchicine but had multiple episodes of diarrhea. After three days of diarrhea he stopped taking the colchicine, but his gout symptoms persistently worsened. He limits his fluid intake and has not taken much solid food in a week. He and his family endorse decrease urine output with dark appearing urine.   Denies fevers, chills. No chest pain, SOB, PND or orthopnea. Usually weighs himself daily at home (range 203-204) but has not been weighing himself recently. Always has LE edema, worse with gout flares.   He has begun the transplant evaluation process at [facility]. Elevated PSA of unclear etiology, awaiting urology evaluation.  Review of Systems:     Cardiac Review of Systems: {Y] = yes [ ]  = no  Chest Pain [    ]  Resting SOB [   ] Exertional SOB  [Y  ]  Orthopnea [  ]   Pedal Edema [ Y  ]    Palpitations [  ] Syncope  [  ]   Presyncope [   ]  General Review of Systems: [Y] = yes [  ]=no Constitional: recent weight change [  ]; anorexia [ Y ]; fatigue [Y  ]; nausea [  ]; night sweats [  ]; fever [  ]; or chills [  ];                                                                     Eyes : blurred vision [  ]; diplopia [   ]; vision changes [  ];  Amaurosis fugax[  ]; Resp: cough [  ];  wheezing[  ];  hemoptysis[  ];  PND [  ];  GI:  gallstones[  ], vomiting[  ];   dysphagia[  ]; melena[  ];  hematochezia [  ]; heartburn[  ];   GU: kidney stones [  ]; hematuria[  ];   dysuria [  ];  nocturia[  ]; incontinence [  ];             Skin: rash, swelling[  ];, hair loss[  ];  peripheral edema[Y  ];  or itching[  ]; Musculosketetal: myalgias[  ];  joint swelling[ Y ];  joint erythema[  Y];  joint pain[Y  ];  back pain[  ];  Heme/Lymph: bruising[  ];  bleeding[  ];  anemia[  ];  Neuro: TIA[  ];  headaches[  ];  stroke[  ];  vertigo[  ];  seizures[  ];   paresthesias[  ];  difficulty walking[  ];  Psych:depression[  ]; anxiety[  ];  Endocrine: diabetes[  ];  thyroid dysfunction[  ];  Other:  Past Medical History:  Diagnosis Date  . AICD (automatic cardioverter/defibrillator) present 09/2015   [facility] LambertEll[facility]se VR model [id number] (serial Number [id number]) ICD   . Chronic edema   . CMI (chronic mesenteric ischemia) (HCC)   . Coronary artery disease   . Gout   . Gout   . Hypertension   . Ischemic cardiomyopathy    EF 25%  . Myocardial infarction [facility](HCC)    "they saw I'd had one; not sure when but it was before 2012"  . Obesity   . Type II diabetes mellitus (HCC)      (Not in a hospital admission)  Infusions:  Allergies  Allergen Reactions  . Spironolactone Other (See Comments)    Increases potassium levels    Social History   Social History  . Marital status: Married    Spouse name: N/A  . Number of children: N/A  . Years of education: N/A   Occupational History  . Truck Advertising copywriterDriver     Instructional driver   Social History Main Topics  . Smoking status: Never Smoker  . Smokeless tobacco: Former NeurosurgeonUser    Types: Chew     Comment: "randomly chewed in my 20's"  . Alcohol use 0.0 oz/week     Comment: 10/06/2015 "might have a beer q couple weeks"  . Drug use: No  . Sexual activity: Not Currently   Other Topics Concern  . Not on file   Social History Narrative   [place].   Worked as a Psychologist, sport and exerciseTruck Driving  Instructor, though now on disability    Family History  Problem Relation Age of Onset  . Hypertension Mother   . Diabetes type II Mother   . Hypertension Father   . Hypertension Sister   . Hypertension Paternal Grandmother   . Diabetes Other   . Hypertension Other    PHYSICAL EXAM: Vitals:   03/01/17 2215 03/01/17 2230  BP: 101/73 105/71  Pulse: 63 (!) 59  Resp:    Temp:     No intake or output data in the 24 hours ending 03/01/17 2305  General:  Frail appearing man, mild temporal wasting, comfortable HEENT: NCAT, tacky MM Neck: supple. JVD just able clavicle at 60 degrees, rises 1 cm but falls rapidly with hepatic pressure. Carotids 2+ bilat; no bruits. No lymphadenopathy or thryomegaly appreciated. Cor: PMI displaced laterally. Regular rate & rhythm. No rubs, gallops or murmurs. Lungs: clear Abdomen: soft, nontender, nondistended. No hepatosplenomegaly. No bruits or masses. Good bowel sounds. Extremities: no cyanosis, clubbing. Bilateral LE edema from mid shin and distal. Bilateral 1st mtp joint erythema and tenderness. Mild erythema and tenderness near malleolus lateral bilaterally. Strong pulses. Area on posterior lower R calf covered w/bandage, pt states this is tophi Neuro: alert & oriented x 3, cranial nerves grossly intact. moves all 4 extremities independently. Affect pleasant.   Results for orders placed or performed during the hospital encounter of 03/01/17 (from the past 24 hour(s))  CBC with Differential     Status: Abnormal   Collection Time: 03/01/17  6:02 PM  Result Value Ref Range   WBC 12.9 (H) 4.0 - 10.5 K/uL   RBC 2.98 (L) 4.22 - 5.81 MIL/uL   Hemoglobin 8.0 (L) 13.0 - 17.0 g/dL   HCT 91.424.9 (L) 78.239.0 - 95.652.0 %   MCV 83.6 78.0 - 100.0 fL   MCH 26.8 26.0 - 34.0 pg   MCHC 32.1 30.0 - 36.0 g/dL   RDW 21.316.0 (  H) 11.5 - 15.5 %   Platelets 290 150 - 400 K/uL   Neutrophils Relative % 89 %   Neutro Abs 11.5 (H) 1.7 - 7.7 K/uL   Lymphocytes Relative 7 %   Lymphs  Abs 0.9 0.7 - 4.0 K/uL   Monocytes Relative 4 %   Monocytes Absolute 0.5 0.1 - 1.0 K/uL   Eosinophils Relative 0 %   Eosinophils Absolute 0.0 0.0 - 0.7 K/uL   Basophils Relative 0 %   Basophils Absolute 0.0 0.0 - 0.1 K/uL  Uric acid     Status: Abnormal   Collection Time: 03/01/17  6:02 PM  Result Value Ref Range   Uric Acid, Serum 9.9 (H) 4.4 - 7.6 mg/dL  Comprehensive metabolic panel     Status: Abnormal   Collection Time: 03/01/17  6:02 PM  Result Value Ref Range   Sodium 126 (L) 135 - 145 mmol/L   Potassium 4.1 3.5 - 5.1 mmol/L   Chloride 97 (L) 101 - 111 mmol/L   CO2 18 (L) 22 - 32 mmol/L   Glucose, Bld 229 (H) 65 - 99 mg/dL   BUN 92 (H) 6 - 20 mg/dL   Creatinine, Ser 1.61 (H) 0.61 - 1.24 mg/dL   Calcium 8.2 (L) 8.9 - 10.3 mg/dL   Total Protein 6.0 (L) 6.5 - 8.1 g/dL   Albumin 2.4 (L) 3.5 - 5.0 g/dL   AST 41 15 - 41 U/L   ALT 20 17 - 63 U/L   Alkaline Phosphatase 113 38 - 126 U/L   Total Bilirubin 1.0 0.3 - 1.2 mg/dL   GFR calc non Af Amer 31 (L) >60 mL/min   GFR calc Af Amer 36 (L) >60 mL/min   Anion gap 11 5 - 15  Digoxin level     Status: Abnormal   Collection Time: 03/01/17  9:29 PM  Result Value Ref Range   Digoxin Level 0.4 (L) 0.8 - 2.0 ng/mL  Urinalysis, Routine w reflex microscopic     Status: Abnormal   Collection Time: 03/01/17 10:25 PM  Result Value Ref Range   Color, Urine YELLOW YELLOW   APPearance HAZY (A) CLEAR   Specific Gravity, Urine 1.014 1.005 - 1.030   pH 5.0 5.0 - 8.0   Glucose, UA NEGATIVE NEGATIVE mg/dL   Hgb urine dipstick NEGATIVE NEGATIVE   Bilirubin Urine NEGATIVE NEGATIVE   Ketones, ur NEGATIVE NEGATIVE mg/dL   Protein, ur NEGATIVE NEGATIVE mg/dL   Nitrite NEGATIVE NEGATIVE   Leukocytes, UA NEGATIVE NEGATIVE   No results found.   ASSESSMENT/DISCUSSION: James [name] is a 67 yp man with chronic systolic heart failure (prior transplant eval at [facility]) 2/2 ICM (last EF 20-25%), CAD s/p CABG, gout, DM2, HTN, and CKD who presented with  severe gout. Cardiology was consulted at the request of Dr. Adela Glimpse for assistance with medication management given acute kidney injury.  Physical exam and history consistent with volume depletion and acute gout flare. Less likely infection given lack of constitutional symptoms and history. Hyponatremia, elevated BUN and Cr suggest hypovolemia as possible cause of AKI.  Recommendations: -hold entresto with AKI -hold diuretics for now given AKI and hypovolemia -ok to give back ~500 ml fluids slowly. Would expect Na, BUN, and Cr to improve with this. If labs worsen, need to consider other causes. Extremities are warm, does not appear to be in cardiogenic shock as cause of AKI -agree with no NSAIDs and no colchicine give AKI. Steroids will likely make patient retain fluid, watch  volume status -agree w/urine studies for hyponatremia (FeUrea given diuretics)  We will continue to follow. Please contact with questions.

## 2017-03-01 NOTE — H&P (Signed)
James [name] ZOX:[id number] DOB: 12/31/1960 DOA: 03/01/2017     PCP: Joaquin Courts, DO   Outpatient Specialists: Rheumatology:  Dr. Dierdre Forth   Patient coming from:   home Lives  With family   Chief Complaint: severe joint pain  HPI: James [name] is a 56 y.o. male with medical history significant of severe tophaceous gout, DM2, HTN, CKD, CAD s/p CABG, and chronic systolic CHF EF 15 % on hert transplant list at [facility], s/p St Jude ICD 10/06/15.    Presented with 3 days of severe gout flareup started his fingers now also involving his knees and elbows patient had attempted to use colchicine but again diarrhea so he stopped./ Patient has taken some Tylenol with minimal pain control  States he takes Uloric on a daily basis; followed by [facility] Rheumatology.  No associated chest pain, nausea or vomiting. He was noted to have up to 2.28 from base line of 1.56    Regarding pertinent Chronic problems:   Last Echo EF 15%, mild to moderately decreased RV systolic function 9/17, RHC in 12/17 showed mildly elevated filling pressures with preserved cardiac output. He is off spironolactone given elevated K. end stage CHF with cardiorenal syndrome He has been evaluated for [facility] at [facility], but PSA was found to be elevated and he will be seeing his urologist.  In the mean time, LVAD remains an option if he were to decompensate.  He has undergone workup for LVAD already.  We will need to be careful not to miss potentially narrow window with renal function as per cardiology notes. Gout on Uloric Uric acid has been decreasing Sees Dr Dierdre Forth.   IN ER:  Temp (24hrs), Avg:99.9 F (37.7 C), Min:99.9 F (37.7 C), Max:99.9 F (37.7 C)      RR 18, 100% Hr 57 BP 99/69 (his base line)  WBC 12.9 Hg 8 PLt 290  Uric acid 9.9   NA 126 K 4.1 bicarb 18 glucose 229 Cr 2.28 alb 2.4 Following Medications were ordered in ER: Medications  oxyCODONE-acetaminophen (PERCOCET/ROXICET) 5-325 MG per tablet 1  tablet (1 tablet Oral Given 03/01/17 1948)  predniSONE (DELTASONE) tablet 60 mg (60 mg Oral Given 03/01/17 1948)    Hospitalist was called for admission for gout flair and AKI  Review of Systems:    Pertinent positives include: dyspnea on exertion,  joint pain or no joint swelling  Constitutional:  No weight loss, night sweats, Fevers, chills, fatigue, weight loss  HEENT:  No headaches, Difficulty swallowing,Tooth/dental problems,Sore throat,  No sneezing, itching, ear ache, nasal congestion, post nasal drip,  Cardio-vascular:  No chest pain, Orthopnea, PND, anasarca, dizziness, palpitations.no Bilateral lower extremity swelling  GI:  No heartburn, indigestion, abdominal pain, nausea, vomiting, diarrhea, change in bowel habits, loss of appetite, melena, blood in stool, hematemesis Resp:  no shortness of breath at rest. No , No excess mucus, no productive cough, No non-productive cough, No coughing up of blood.No change in color of mucus.No wheezing. Skin:  no rash or lesions. No jaundice GU:  no dysuria, change in color of urine, no urgency or frequency. No straining to urinate.  No flank pain.  Musculoskeletal:    No decreased range of motion. No back pain.  Psych:  No change in mood or affect. No depression or anxiety. No memory loss.  Neuro: no localizing neurological complaints, no tingling, no weakness, no double vision, no gait abnormality, no slurred speech, no confusion  As per HPI otherwise 10 point review  of systems negative.   Past Medical History: Past Medical History:  Diagnosis Date  . AICD (automatic cardioverter/defibrillator) present 09/2015   [facility] [place] VR model [id number] (serial Number [id number]) ICD   . Chronic edema   . CMI (chronic mesenteric ischemia) (HCC)   . Coronary artery disease   . Gout   . Gout   . Hypertension   . Ischemic cardiomyopathy    EF 25%  . Myocardial infarction [facility])    "they saw I'd had one; not sure when but it  was before 2012"  . Obesity   . Type II diabetes mellitus (HCC)    Past Surgical History:  Procedure Laterality Date  . CARDIAC CATHETERIZATION  11/2010  . CARDIAC CATHETERIZATION N/A 07/15/2016   Procedure: Right Heart Cath;  Surgeon: Laurey Morale, MD;  Location: [facility] INVASIVE CV LAB;  Service: Cardiovascular;  Laterality: N/A;  . CORONARY ARTERY BYPASS GRAFT  11/2010   Sequential left internal mammary graft to the mid and distal LAD, sequential sapenous vein graft to the first, second, and third obutuse marginal branches  of the  left circumflex  coronary artery.  Saphenous vein graft to the distal right coronary artery.  . EP IMPLANTABLE DEVICE N/A 10/06/2015   Procedure: ICD Implant;  Surgeon: Will Jorja Loa, MD; [facility] [place] VR model [id number] (serial Number [id number]) ICD implanted L chest   . RIGHT HEART CATH N/A 11/27/2016   Procedure: Right Heart Cath;  Surgeon: Laurey Morale, MD;  Location: [facility] INVASIVE CV LAB;  Service: Cardiovascular;  Laterality: N/A;  . SHOULDER OPEN ROTATOR CUFF REPAIR Right ~ 2000     Social History:  Ambulatory   independently     reports that he has never smoked. He has quit using smokeless tobacco. His smokeless tobacco use included Chew. He reports that he drinks alcohol. He reports that he does not use drugs.  Allergies:   Allergies  Allergen Reactions  . Spironolactone Other (See Comments)    Increases potassium levels       Family History:   Family History  Problem Relation Age of Onset  . Hypertension Mother   . Diabetes type II Mother   . Hypertension Father   . Hypertension Sister   . Hypertension Paternal Grandmother   . Diabetes Other   . Hypertension Other     Medications: Prior to Admission medications   Medication Sig Start Date End Date Taking? Authorizing Provider  acetaminophen (TYLENOL) 500 MG tablet Take 500-1,000 mg by mouth every 4 (four) hours as needed for headache (pain).   Yes [provider]  acetaminophen (TYLENOL) 650 MG CR tablet Take 1,300 mg by mouth once.   Yes [provider]  aspirin EC 81 MG tablet Take 81 mg by mouth daily.   Yes [provider]  atorvastatin (LIPITOR) 80 MG tablet TAKE ONE TABLET BY MOUTH DAILY Patient taking differently: Take 80 mg by mouth at bedtime.  09/20/16  Yes Laurey Morale, MD  carvedilol (COREG) 25 MG tablet Take 1 tablet (25 mg total) by mouth 2 (two) times daily with a meal. Please schedule appointment for refills. 09/20/16  Yes Laurey Morale, MD  colchicine 0.6 MG tablet Take 0.6 mg by mouth daily as needed (gout). Reported on 02/21/2016   Yes [provider]  digoxin (LANOXIN) 0.125 MG tablet Take 0.5 tablets (0.0625 mg total) by mouth daily. 02/20/17  Yes Laurey Morale, MD  febuxostat (ULORIC) 40 MG  tablet Take 120 mg by mouth daily.   Yes [provider]  glipiZIDE (GLUCOTROL) 5 MG tablet TAKE 1 TABLET (5 MG TOTAL) BY MOUTH 2 (TWO) TIMES DAILY BEFORE MEALS (REFER FUTURE REFILLS TO PRIMARY CARE PHYSICIAN) 04/24/16  Yes Hochrein, Fayrene FearingJames, MD  isosorbide-hydrALAZINE (BIDIL) 20-37.5 MG tablet Take 1.5 tablets by mouth 3 (three) times daily. 11/12/16  Yes Laurey MoraleMcLean, Dalton S, MD  nitroGLYCERIN (NITROSTAT) 0.4 MG SL tablet Place 1 tablet (0.4 mg total) under the tongue every 5 (five) minutes as needed for chest pain. 02/18/17 05/07/20 Yes Laurey MoraleMcLean, Dalton S, MD  sacubitril-valsartan (ENTRESTO) 49-51 MG Take 0.5 tablets by mouth 2 (two) times daily.   Yes [provider]  torsemide (DEMADEX) 20 MG tablet Take 20 mg by mouth daily.    Yes [provider]    Physical Exam: Patient Vitals for the past 24 hrs:  BP Temp Temp src Pulse Resp SpO2 Height Weight  03/01/17 2115 99/69 - - (!) 57 - 100 % - -  03/01/17 2045 100/70 - - (!) 58 - 100 % - -  03/01/17 2030 101/69 - - 63 - 100 % - -  03/01/17 2000 93/63 - - 65 - 100 % - -  03/01/17 1930 93/65 - - 67 - 100 % - -  03/01/17 1908 - - - 71 -  100 % - -  03/01/17 1907 99/65 - - - - - - -  03/01/17 1740 - - - - - - 6' (1.829 m) 93.4 kg (206 lb)  03/01/17 1736 (!) 97/59 99.9 F (37.7 C) Oral (!) 59 18 99 % - -    1. General:  in No Acute distress 2. Psychological: Alert and  Oriented 3. Head/ENT:    Dry Mucous Membranes                          Head Non traumatic, neck supple                          Normal Dentition 4. SKIN:   decreased Skin turgor,  Skin clean Dry and intact no rash 5. Heart: Regular rate and rhythm no Murmur, Rub or gallop 6. Lungs: Clear to auscultation bilaterally, no wheezes or crackles   7. Abdomen: Soft,  non-tender, Non distended 8. Lower extremities: no clubbing, cyanosis, or edema 9. Neurologically Grossly intact, moving all 4 extremities equally  10. MSK: decreased range of motion in knees and elbows   body mass index is 27.94 kg/m.  Labs on Admission:   Labs on Admission: I have personally reviewed following labs and imaging studies  CBC:  Recent Labs Lab 03/01/17 1802  WBC 12.9*  NEUTROABS 11.5*  HGB 8.0*  HCT 24.9*  MCV 83.6  PLT 290   Basic Metabolic Panel:  Recent Labs Lab 03/01/17 1802  NA 126*  K 4.1  CL 97*  CO2 18*  GLUCOSE 229*  BUN 92*  CREATININE 2.28*  CALCIUM 8.2*   GFR: Estimated Creatinine Clearance: 44.5 mL/min (A) (by C-G formula based on SCr of 2.28 mg/dL (H)). Liver Function Tests:  Recent Labs Lab 03/01/17 1802  AST 41  ALT 20  ALKPHOS 113  BILITOT 1.0  PROT 6.0*  ALBUMIN 2.4*   No results for input(s): LIPASE, AMYLASE in the last 168 hours. No results for input(s): AMMONIA in the last 168 hours. Coagulation Profile: No results for input(s): INR, PROTIME in the  last 168 hours. Cardiac Enzymes: No results for input(s): CKTOTAL, CKMB, CKMBINDEX, TROPONINI in the last 168 hours. BNP (last 3 results) No results for input(s): PROBNP in the last 8760 hours. HbA1C: No results for input(s): HGBA1C in the last 72 hours. CBG: No results for  input(s): GLUCAP in the last 168 hours. Lipid Profile: No results for input(s): CHOL, HDL, LDLCALC, TRIG, CHOLHDL, LDLDIRECT in the last 72 hours. Thyroid Function Tests: No results for input(s): TSH, T4TOTAL, FREET4, T3FREE, THYROIDAB in the last 72 hours. Anemia Panel: No results for input(s): VITAMINB12, FOLATE, FERRITIN, TIBC, IRON, RETICCTPCT in the last 72 hours. Urine analysis:  Sepsis Labs: @LABRCNTIP (procalcitonin:4,lacticidven:4) )No results found for this or any previous visit (from the past 240 hour(s)).    UA  ordered  Lab Results  Component Value Date   HGBA1C 5.5 11/12/2016    Estimated Creatinine Clearance: 44.5 mL/min (A) (by C-G formula based on SCr of 2.28 mg/dL (H)).  BNP (last 3 results) No results for input(s): PROBNP in the last 8760 hours.   ECG REPORT Not obtained  Filed Weights   03/01/17 1740  Weight: 93.4 kg (206 lb)     Cultures: No results found for: SDES, SPECREQUEST, CULT, REPTSTATUS   Radiological Exams on Admission: No results found.  Chart has been reviewed   Assessment/Plan   56 y.o. male with medical history significant of severe tophaceous gout, DM2, HTN, CKD, CAD s/p CABG, and chronic systolic CHF EF 15 % on hert transplant list at [facility], s/p St Jude ICD 10/06/15 admitted with Gout flair up, and AKI possibly due to cardiorenal syndrome.   Present on Admission:  . Chronic systolic congestive heart failure (HCC) - Defer to cardiology hold Entresto given AKI . CKD (chronic kidney disease) stage 3, GFR 30-59 ml/min . Gout, unspecified - treat with prednisone and pain mangement not an NSAID candidate, does not tolerate  Colchicine . HYPERTENSION, BENIGN - continue home medications . AKI (acute kidney injury) (HCC)  - possibly due to dehydration decreased PO intake but also could be cardiorenal syndrome Hold Entersto,  May benefit from nephrology consult if does not improve.    DM 2 - hold po meds, order SSI . AICD (automatic  cardioverter/defibrillator) present - stable Hyponatremia - will obtain urine studies, Possibly due to dehydration, vs CHF vs cardio renal syndrome.Other plan as per orders.  DVT prophylaxis:   Lovenox     Code Status:  FULL CODE   as per patient    Family Communication:   Family  at  Bedside  plan of care was discussed with  Son, Wife,  Disposition Plan:   To home once workup is complete and patient is stable                              Consults called: Cardiology   Admission status:    obs   Level of care   tele           I have spent a total of 66 min on this admission   extra time was spent to discuss case with cardiology  Landon Truax 03/01/2017, 10:42 PM    [facility]  Pager [phone]   after 2 AM please page floor coverage PA If 7AM-7PM, please contact the day team taking care of the patient  [email or web address]  Password [email or web address]

## 2017-03-01 NOTE — ED Triage Notes (Addendum)
Pt here from home with c/o severe gout flare up-- started in fingers, now is unable to move-- medications have been giving him diarrhea-- knees swollen, elbows swollen--  Pt also has an EF of 15%-- waiting possibly for heart transplant-- per wife.   Pt took 2 ES tylenol at 11am-

## 2017-03-01 NOTE — ED Provider Notes (Signed)
[facility]-EMERGENCY DEPT Provider Note   CSN: [id number] Arrival date & time: 03/01/17  1637     History   Chief Complaint Chief Complaint  Patient presents with  . Generalized Body Aches  . Gout    HPI James [name] is a 56 y.o. male.  Past medical history of gout, hypertension, MI, CHF with EF of 15%, renal insufficiency, type 2 diabetes, and with pain and swelling in multiple joints. Patient states on Sunday pain began in his left knee with warmth and swelling, not relieved with colchicine or tylenol. Reports he began feeling pain and swelling since Thursday in multiple other joints including his fingers, wrists, elbows, and down his legs into his feet. Patient states colchicine causes him to have significant diarrhea, therefore he is unable to take it frequently. States he takes Uloric on a daily basis; followed by [facility] Rheumatology. Due to his renal function he is unable to take ibuprofen. Recent follow-up appointment with his cardiologist for his heart failure; states he was told everything "looked good." Pt is on the heart transplant list. Denies F/C, abd pain, N/V, shortness of breath, chest pain.       Past Medical History:  Diagnosis Date  . AICD (automatic cardioverter/defibrillator) present 09/2015   [facility] [place] VR model [id number] (serial Number [id number]) ICD   . Chronic edema   . CMI (chronic mesenteric ischemia) (HCC)   . Coronary artery disease   . Gout   . Gout   . Hypertension   . Ischemic cardiomyopathy    EF 25%  . Myocardial infarction [facility])    "they saw I'd had one; not sure when but it was before 2012"  . Obesity   . Type II diabetes mellitus [facility])     Patient Active Problem List   Diagnosis Date Noted  . Cardiomyopathy (HCC) 10/06/2015  . CHF (congestive heart failure) (HCC) 10/06/2015  . AICD (automatic cardioverter/defibrillator) present 09/21/2015  . CKD (chronic kidney disease) stage 3, GFR 30-59 ml/min 08/01/2015  . Depression  02/07/2012  . Chronic systolic congestive heart failure (HCC) 09/18/2011  . Congestion 08/22/2011  . Dyslipidemia 07/08/2011  . ED (erectile dysfunction) 06/25/2011  . DM 12/17/2010  . Gout, unspecified 12/17/2010  . HYPONATREMIA 12/17/2010  . OVERWEIGHT/OBESITY 12/17/2010  . HYPERTENSION, BENIGN 12/17/2010  . ISCHEMIC CARDIOMYOPATHY 12/17/2010  . RENAL INSUFFICIENCY 12/17/2010  . EDEMA 12/17/2010  . CORONARY ARTERY BYPASS GRAFT, HX OF 12/17/2010    Past Surgical History:  Procedure Laterality Date  . CARDIAC CATHETERIZATION  11/2010  . CARDIAC CATHETERIZATION N/A 07/15/2016   Procedure: Right Heart Cath;  Surgeon: Laurey Morale, MD;  Location: [facility] INVASIVE CV LAB;  Service: Cardiovascular;  Laterality: N/A;  . CORONARY ARTERY BYPASS GRAFT  11/2010   Sequential left internal mammary graft to the mid and distal LAD, sequential sapenous vein graft to the first, second, and third obutuse marginal branches  of the  left circumflex  coronary artery.  Saphenous vein graft to the distal right coronary artery.  . EP IMPLANTABLE DEVICE N/A 10/06/2015   Procedure: ICD Implant;  Surgeon: Will Jorja Loa, MD; [facility] [place] VR model [id number] (serial Number [id number]) ICD implanted L chest   . RIGHT HEART CATH N/A 11/27/2016   Procedure: Right Heart Cath;  Surgeon: Laurey Morale, MD;  Location: [facility] INVASIVE CV LAB;  Service: Cardiovascular;  Laterality: N/A;  . SHOULDER OPEN ROTATOR CUFF REPAIR Right ~ 2000       Home Medications  Prior to Admission medications   Medication Sig Start Date End Date Taking? Authorizing Provider  acetaminophen (TYLENOL) 500 MG tablet Take 500-1,000 mg by mouth every 4 (four) hours as needed for headache (pain).   Yes [provider]  acetaminophen (TYLENOL) 650 MG CR tablet Take 1,300 mg by mouth once.   Yes [provider]  aspirin EC 81 MG tablet Take 81 mg by mouth daily.   Yes [provider]  atorvastatin  (LIPITOR) 80 MG tablet TAKE ONE TABLET BY MOUTH DAILY Patient taking differently: Take 80 mg by mouth at bedtime.  09/20/16  Yes Laurey Morale, MD  carvedilol (COREG) 25 MG tablet Take 1 tablet (25 mg total) by mouth 2 (two) times daily with a meal. Please schedule appointment for refills. 09/20/16  Yes Laurey Morale, MD  colchicine 0.6 MG tablet Take 0.6 mg by mouth daily as needed (gout). Reported on 02/21/2016   Yes [provider]  digoxin (LANOXIN) 0.125 MG tablet Take 0.5 tablets (0.0625 mg total) by mouth daily. 02/20/17  Yes Laurey Morale, MD  febuxostat (ULORIC) 40 MG tablet Take 120 mg by mouth daily.   Yes [provider]  glipiZIDE (GLUCOTROL) 5 MG tablet TAKE 1 TABLET (5 MG TOTAL) BY MOUTH 2 (TWO) TIMES DAILY BEFORE MEALS (REFER FUTURE REFILLS TO PRIMARY CARE PHYSICIAN) 04/24/16  Yes Hochrein, Fayrene Fearing, MD  isosorbide-hydrALAZINE (BIDIL) 20-37.5 MG tablet Take 1.5 tablets by mouth 3 (three) times daily. 11/12/16  Yes Laurey Morale, MD  nitroGLYCERIN (NITROSTAT) 0.4 MG SL tablet Place 1 tablet (0.4 mg total) under the tongue every 5 (five) minutes as needed for chest pain. 02/18/17 05/07/20 Yes Laurey Morale, MD  sacubitril-valsartan (ENTRESTO) 49-51 MG Take 0.5 tablets by mouth 2 (two) times daily.   Yes [provider]  torsemide (DEMADEX) 20 MG tablet Take 20 mg by mouth daily.    Yes [provider]    Family History Family History  Problem Relation Age of Onset  . Hypertension Mother   . Diabetes type II Mother   . Hypertension Father   . Hypertension Sister   . Hypertension Paternal Grandmother   . Diabetes Other   . Hypertension Other     Social History Social History  Substance Use Topics  . Smoking status: Never Smoker  . Smokeless tobacco: Former Neurosurgeon    Types: Chew     Comment: "randomly chewed in my 20's"  . Alcohol use 0.0 oz/week     Comment: 10/06/2015 "might have a beer q couple weeks"     Allergies    Spironolactone   Review of Systems Review of Systems  Constitutional: Negative for chills and fever.  Respiratory: Negative for shortness of breath.   Cardiovascular: Negative for chest pain.  Gastrointestinal: Positive for diarrhea. Negative for abdominal pain, nausea and vomiting.  Genitourinary: Negative for dysuria.  Musculoskeletal: Positive for arthralgias and joint swelling.     Physical Exam Updated Vital Signs BP 100/70   Pulse (!) 58   Temp (S) 99.9 F (37.7 C) (Oral)   Resp 18   Ht 6' (1.829 m)   Wt 93.4 kg   SpO2 100%   BMI 27.94 kg/m   Physical Exam  Constitutional: He appears well-developed and well-nourished.  HENT:  Head: Normocephalic and atraumatic.  Eyes: Conjunctivae are normal.  Neck: Normal range of motion. Neck supple.  Cardiovascular: Normal rate, regular rhythm and intact distal pulses.  Exam reveals no gallop and no friction  rub.   Systolic and diastolic murmur  Pulmonary/Chest: Effort normal. No respiratory distress.  b/l rhonchi  Abdominal: Soft. Bowel sounds are normal. He exhibits no distension. There is no tenderness. There is no rebound and no guarding.  Musculoskeletal:  Left 2nd, 3rd, 4th fingers, Right 3rd, 4th, 5th fingers with significant inflammation, redness warmth, tophi, dec ROM. b/l elbows and b/l ankles w tenderness, tophi, dec ROM. b/l knees w edema L>R, warmth, dec ROM. b/l lower extremity pitting edema.   Psychiatric: He has a normal mood and affect. His behavior is normal.  Nursing note and vitals reviewed.    ED Treatments / Results  Labs (all labs ordered are listed, but only abnormal results are displayed) Labs Reviewed  CBC WITH DIFFERENTIAL/PLATELET - Abnormal; Notable for the following:       Result Value   WBC 12.9 (*)    RBC 2.98 (*)    Hemoglobin 8.0 (*)    HCT 24.9 (*)    RDW 16.0 (*)    Neutro Abs 11.5 (*)    All other components within normal limits  URIC ACID - Abnormal; Notable for the  following:    Uric Acid, Serum 9.9 (*)    All other components within normal limits  COMPREHENSIVE METABOLIC PANEL - Abnormal; Notable for the following:    Sodium 126 (*)    Chloride 97 (*)    CO2 18 (*)    Glucose, Bld 229 (*)    BUN 92 (*)    Creatinine, Ser 2.28 (*)    Calcium 8.2 (*)    Total Protein 6.0 (*)    Albumin 2.4 (*)    GFR calc non Af Amer 31 (*)    GFR calc Af Amer 36 (*)    All other components within normal limits  DIGOXIN LEVEL    EKG  EKG Interpretation None       Radiology No results found.  Procedures Procedures (including critical care time)  Medications Ordered in ED Medications  oxyCODONE-acetaminophen (PERCOCET/ROXICET) 5-325 MG per tablet 1 tablet (1 tablet Oral Given 03/01/17 1948)  predniSONE (DELTASONE) tablet 60 mg (60 mg Oral Given 03/01/17 1948)     Initial Impression / Assessment and Plan / ED Course  I have reviewed the triage vital signs and the nursing notes.  Pertinent labs & imaging results that were available during my care of the patient were reviewed by me and considered in my medical decision making (see chart for details).     Pt with polyarticular gout. Cr elevated from baseline, hyponatremic, likely 2/t diuresis. Hgb decreased from baseline. Uric acid 9.9. Pt with significant edema, redness, warmth to multiple joints in upper and lower extremities. Percocet for pain. Prednisone given for inflammation. Pt to be admitted.   Patient discussed with and seen by Dr. Freida BusmanAllen.  Final Clinical Impressions(s) / ED Diagnoses   Final diagnoses:  Polyarticular gout    New Prescriptions New Prescriptions   No medications on file     Russo, [place]azil[place]Jordan N, PA-C 03/01/17 2115    Lorre NickAllen, Anthony, MD 03/01/17 2123

## 2017-03-02 DIAGNOSIS — D72829 Elevated white blood cell count, unspecified: Secondary | ICD-10-CM | POA: Diagnosis not present

## 2017-03-02 DIAGNOSIS — N179 Acute kidney failure, unspecified: Secondary | ICD-10-CM | POA: Diagnosis not present

## 2017-03-02 DIAGNOSIS — D638 Anemia in other chronic diseases classified elsewhere: Secondary | ICD-10-CM | POA: Diagnosis not present

## 2017-03-02 DIAGNOSIS — M1000 Idiopathic gout, unspecified site: Secondary | ICD-10-CM

## 2017-03-02 DIAGNOSIS — M109 Gout, unspecified: Secondary | ICD-10-CM | POA: Diagnosis present

## 2017-03-02 DIAGNOSIS — E871 Hypo-osmolality and hyponatremia: Secondary | ICD-10-CM | POA: Diagnosis not present

## 2017-03-02 DIAGNOSIS — I2510 Atherosclerotic heart disease of native coronary artery without angina pectoris: Secondary | ICD-10-CM | POA: Diagnosis not present

## 2017-03-02 DIAGNOSIS — I5022 Chronic systolic (congestive) heart failure: Secondary | ICD-10-CM | POA: Diagnosis not present

## 2017-03-02 DIAGNOSIS — R197 Diarrhea, unspecified: Secondary | ICD-10-CM | POA: Diagnosis not present

## 2017-03-02 DIAGNOSIS — E861 Hypovolemia: Secondary | ICD-10-CM | POA: Diagnosis not present

## 2017-03-02 DIAGNOSIS — N183 Chronic kidney disease, stage 3 (moderate): Secondary | ICD-10-CM | POA: Diagnosis not present

## 2017-03-02 DIAGNOSIS — E860 Dehydration: Secondary | ICD-10-CM | POA: Diagnosis not present

## 2017-03-02 DIAGNOSIS — I5084 End stage heart failure: Secondary | ICD-10-CM | POA: Diagnosis not present

## 2017-03-02 DIAGNOSIS — T504X5A Adverse effect of drugs affecting uric acid metabolism, initial encounter: Secondary | ICD-10-CM | POA: Diagnosis not present

## 2017-03-02 DIAGNOSIS — R972 Elevated prostate specific antigen [PSA]: Secondary | ICD-10-CM | POA: Diagnosis not present

## 2017-03-02 DIAGNOSIS — E1122 Type 2 diabetes mellitus with diabetic chronic kidney disease: Secondary | ICD-10-CM | POA: Diagnosis not present

## 2017-03-02 DIAGNOSIS — I255 Ischemic cardiomyopathy: Secondary | ICD-10-CM | POA: Diagnosis not present

## 2017-03-02 DIAGNOSIS — I130 Hypertensive heart and chronic kidney disease with heart failure and stage 1 through stage 4 chronic kidney disease, or unspecified chronic kidney disease: Secondary | ICD-10-CM | POA: Diagnosis not present

## 2017-03-02 DIAGNOSIS — M1A9XX1 Chronic gout, unspecified, with tophus (tophi): Secondary | ICD-10-CM | POA: Diagnosis not present

## 2017-03-02 DIAGNOSIS — N289 Disorder of kidney and ureter, unspecified: Secondary | ICD-10-CM

## 2017-03-02 LAB — TSH: TSH: 2.637 u[IU]/mL (ref 0.350–4.500)

## 2017-03-02 LAB — GLUCOSE, CAPILLARY
GLUCOSE-CAPILLARY: 223 mg/dL — AB (ref 65–99)
GLUCOSE-CAPILLARY: 236 mg/dL — AB (ref 65–99)
Glucose-Capillary: 241 mg/dL — ABNORMAL HIGH (ref 65–99)
Glucose-Capillary: 204 mg/dL — ABNORMAL HIGH (ref 65–99)

## 2017-03-02 LAB — CBC
HEMATOCRIT: 26.8 % — AB (ref 39.0–52.0)
Hemoglobin: 8.9 g/dL — ABNORMAL LOW (ref 13.0–17.0)
MCH: 27.7 pg (ref 26.0–34.0)
MCHC: 33.2 g/dL (ref 30.0–36.0)
MCV: 83.5 fL (ref 78.0–100.0)
Platelets: 299 10*3/uL (ref 150–400)
RBC: 3.21 MIL/uL — ABNORMAL LOW (ref 4.22–5.81)
RDW: 16.3 % — AB (ref 11.5–15.5)
WBC: 12.7 10*3/uL — ABNORMAL HIGH (ref 4.0–10.5)

## 2017-03-02 LAB — COMPREHENSIVE METABOLIC PANEL
ALT: 20 U/L (ref 17–63)
ANION GAP: 11 (ref 5–15)
AST: 35 U/L (ref 15–41)
Albumin: 2.2 g/dL — ABNORMAL LOW (ref 3.5–5.0)
Alkaline Phosphatase: 123 U/L (ref 38–126)
BUN: 91 mg/dL — ABNORMAL HIGH (ref 6–20)
CHLORIDE: 97 mmol/L — AB (ref 101–111)
CO2: 20 mmol/L — ABNORMAL LOW (ref 22–32)
Calcium: 8.3 mg/dL — ABNORMAL LOW (ref 8.9–10.3)
Creatinine, Ser: 2.19 mg/dL — ABNORMAL HIGH (ref 0.61–1.24)
GFR, EST AFRICAN AMERICAN: 38 mL/min — AB (ref >60)
GFR, EST NON AFRICAN AMERICAN: 33 mL/min — AB (ref >60)
Glucose, Bld: 220 mg/dL — ABNORMAL HIGH (ref 65–99)
POTASSIUM: 4.3 mmol/L (ref 3.5–5.1)
Sodium: 128 mmol/L — ABNORMAL LOW (ref 135–145)
TOTAL PROTEIN: 5.9 g/dL — AB (ref 6.5–8.1)
Total Bilirubin: 1.1 mg/dL (ref 0.3–1.2)

## 2017-03-02 LAB — MAGNESIUM: MAGNESIUM: 2.1 mg/dL (ref 1.7–2.4)

## 2017-03-02 LAB — PHOSPHORUS: PHOSPHORUS: 3.6 mg/dL (ref 2.5–4.6)

## 2017-03-02 MED ORDER — ALUM & MAG HYDROXIDE-SIMETH 200-200-20 MG/5ML PO SUSP
30.0000 mL | Freq: Four times a day (QID) | ORAL | Status: DC | PRN
  Administered 2017-03-02: 30 mL via ORAL
  Filled 2017-03-02: qty 30

## 2017-03-02 MED ORDER — SODIUM CHLORIDE 0.9 % IV SOLN
INTRAVENOUS | Status: AC
  Administered 2017-03-02: 12:00:00 via INTRAVENOUS

## 2017-03-02 NOTE — Progress Notes (Signed)
Patient ID: James [name], male   DOB: 04/07/1961, 56 y.o.   MRN: [id number]   PROGRESS NOTE    James [name]  WUJ:[id number] DOB: [birth year] DOA: 03/01/2017 PCP: Joaquin Courts, DO   Brief Narrative:  56 y.o. male with medical history significant of severe tophaceous gout, DM2, HTN, CKD, CAD s/p CABG, and chronic systolic CHF EF 15 % on heart transplant list at [facility], s/p St Jude ICD 10/06/15 presented with gout flareup which did not improve with colchicine which caused diarrhea. He was admitted with gout flareup and acute kidney injury. Cardiology has evaluated the patient   Assessment & Plan:   Active Problems:   DM (diabetes mellitus), type 2 (HCC)   Gout, unspecified   HYPERTENSION, BENIGN   CORONARY ARTERY BYPASS GRAFT, HX OF   Chronic systolic congestive heart failure (HCC)   CKD (chronic kidney disease) stage 3, GFR 30-59 ml/min   Cardiomyopathy (HCC)   AICD (automatic cardioverter/defibrillator) present   AKI (acute kidney injury) (HCC)   DM (diabetes mellitus), type 2 with renal complications (HCC)   Hyponatremia   Polyarticular gout   Renal insufficiency  1. Probable acute gout flare: Continue with prednisone as not a  NSAID candidate. Did not tolerate colchicine due to diarrhea  2. Acute kidney injury: Probably secondary to dehydration and decreased by mouth intake. Continue gentle IV fluid hydration; repeat a.m. labs. Hold Entresto for now. If creatinine worsens, will get nephrology consultation.  3. Chronic systolic congestive heart failure: Cardiology evaluation appreciated. Hold entresto for now. Patient already has an AICD  4. Chronic kidney disease stage III  5. Hypertension: Monitor blood pressure  6. Diabetes mellitus type II: Continue SSI  7. History of coronary artery disease status post CABG  8. Leukocytosis: Probably reactive, repeat a.m. Labs  9. Hyponatremia and hypochloremia: Probably secondary to dehydration, continue gentle hydration, a.m.  labs  10. Anemia probably from anemia of chronic disease from chronic disease: Stable hemoglobin   DVT prophylaxis: Lovenox Code Status: Full Family Communication: Discussed with wife and son present at bedside Disposition Plan: Home in 1-2 days  Consultants: Cardiology  Procedures: None  Antimicrobials: None Subjective: Patient seen and examined at bedside. He feels slightly better. No current nausea, vomiting, chest pain  Objective: Vitals:   03/01/17 2245 03/02/17 0004 03/02/17 0545 03/02/17 1031  BP: 104/73 106/69 115/75 107/68  Pulse: 62 63 61 63  Resp:  16 18   Temp:  97.6 F (36.4 C) 97.4 F (36.3 C)   TempSrc:  Oral Oral   SpO2: 100% 100% 100% 100%  Weight:  92.7 kg (204 lb 4.8 oz)    Height:  6' (1.829 m)      Intake/Output Summary (Last 24 hours) at 03/02/17 1048 Last data filed at 03/02/17 0900  Gross per 24 hour  Intake              360 ml  Output              350 ml  Net               10 ml   Filed Weights   03/01/17 1740 03/02/17 0004  Weight: 93.4 kg (206 lb) 92.7 kg (204 lb 4.8 oz)    Examination:  General exam: Appears calm and comfortable  Respiratory system: Bilateral decreased breath sound at bases With scattered crackles Cardiovascular system: S1 & S2 heard, rate controlled  Gastrointestinal system: Abdomen is nondistended, soft and nontender. No organomegaly  or masses felt. Normal bowel sounds heard. Extremities: No cyanosis, clubbing, trace pitting edema present     Data Reviewed: I have personally reviewed following labs and imaging studies  CBC:  Recent Labs Lab 03/01/17 1802 03/02/17 0415  WBC 12.9* 12.7*  NEUTROABS 11.5*  --   HGB 8.0* 8.9*  HCT 24.9* 26.8*  MCV 83.6 83.5  PLT 290 299   Basic Metabolic Panel:  Recent Labs Lab 03/01/17 1802 03/02/17 0415  NA 126* 128*  K 4.1 4.3  CL 97* 97*  CO2 18* 20*  GLUCOSE 229* 220*  BUN 92* 91*  CREATININE 2.28* 2.19*  CALCIUM 8.2* 8.3*  MG  --  2.1  PHOS  --  3.6    GFR: Estimated Creatinine Clearance: 42.8 mL/min (A) (by C-G formula based on SCr of 2.19 mg/dL (H)). Liver Function Tests:  Recent Labs Lab 03/01/17 1802 03/02/17 0415  AST 41 35  ALT 20 20  ALKPHOS 113 123  BILITOT 1.0 1.1  PROT 6.0* 5.9*  ALBUMIN 2.4* 2.2*   No results for input(s): LIPASE, AMYLASE in the last 168 hours. No results for input(s): AMMONIA in the last 168 hours. Coagulation Profile: No results for input(s): INR, PROTIME in the last 168 hours. Cardiac Enzymes: No results for input(s): CKTOTAL, CKMB, CKMBINDEX, TROPONINI in the last 168 hours. BNP (last 3 results) No results for input(s): PROBNP in the last 8760 hours. HbA1C: No results for input(s): HGBA1C in the last 72 hours. CBG:  Recent Labs Lab 03/01/17 2337 03/02/17 0754  GLUCAP 166* 241*   Lipid Profile: No results for input(s): CHOL, HDL, LDLCALC, TRIG, CHOLHDL, LDLDIRECT in the last 72 hours. Thyroid Function Tests:  Recent Labs  03/02/17 0415  TSH 2.637   Anemia Panel: No results for input(s): VITAMINB12, FOLATE, FERRITIN, TIBC, IRON, RETICCTPCT in the last 72 hours. Sepsis Labs:  Recent Labs Lab 03/01/17 2201  LATICACIDVEN 1.2    No results found for this or any previous visit (from the past 240 hour(s)).       Radiology Studies: No results found.      Scheduled Meds: . aspirin EC  81 mg Oral Daily  . atorvastatin  80 mg Oral QHS  . carvedilol  25 mg Oral BID WC  . digoxin  0.0625 mg Oral Daily  . enoxaparin (LOVENOX) injection  40 mg Subcutaneous Q24H  . febuxostat  120 mg Oral Daily  . insulin aspart  0-5 Units Subcutaneous QHS  . insulin aspart  0-9 Units Subcutaneous TID WC  . isosorbide-hydrALAZINE  1.5 tablet Oral TID  . predniSONE  60 mg Oral Q breakfast  . sodium chloride flush  3 mL Intravenous Q12H  . sodium chloride flush  3 mL Intravenous Q12H   Continuous Infusions: . sodium chloride       LOS: 0 days       Glade LloydKshitiz Devlin Brink, MD [facility] Pager [phone]  If 7PM-7AM, please contact night-coverage [email or web address] Password [email or web address] 03/02/2017, 10:48 AM

## 2017-03-02 NOTE — Evaluation (Signed)
Physical Therapy Evaluation Patient Details Name: James [name] MRN: [id number] DOB: 04-21-1961 Today's Date: 03/02/2017   History of Present Illness  Pt is a 56 yo male admitted through the ED following a 2 week exacerbation of severe tophaceous gout. Pt has a AICD present and is on a list awaiting a heart transplant as he has a 15% EF. PMH significant for DM2, HTN, CKD, CAD s/p CABG.   Clinical Impression  Pt presents with the above diagnosis and below deficits for therapy evaluation. Prior to admission, pt lived with his wife in a split level home and needs to perform stair negotiation to go to main level and up to bedrooms. Pt's wife is only available in the PM and pt will be alone in the AM hours. Pt has a significant cardiac history which leads to decreased endurance and activity tolerance. Pt is able to perform mobility with Min guard to Min A this session and improved as the session progressed. Pain remained constant between 7-8/10 in bilateral feet. Pt will benefit from continued acute PT follow-up in order to address the below deficits prior to discharge to venue recommended below.     Follow Up Recommendations Home health PT;Supervision for mobility/OOB    Equipment Recommendations  None recommended by PT    Recommendations for Other Services       Precautions / Restrictions Precautions Precautions: Fall Restrictions Weight Bearing Restrictions: No      Mobility  Bed Mobility Overal bed mobility: Needs Assistance Bed Mobility: Supine to Sit;Sit to Supine     Supine to sit: Min guard;HOB elevated Sit to supine: Min guard   General bed mobility comments: Min guard for safety   Transfers Overall transfer level: Needs assistance Equipment used: Rolling walker (2 wheeled) Transfers: Sit to/from Stand Sit to Stand: Min assist         General transfer comment: Min A and requires rocking to get momentum to stand from EOB.   Ambulation/Gait Ambulation/Gait  assistance: Min guard Ambulation Distance (Feet): 50 Feet Assistive device: Rolling walker (2 wheeled) Gait Pattern/deviations: Step-through pattern;Decreased step length - right;Decreased step length - left;Trunk flexed Gait velocity: decreased Gait velocity interpretation: Below normal speed for age/gender General Gait Details: decreased step length bilaterally with forward trunk lean and mild shuffeling gait. Mild antalgia noted with gait with WBOS.   Stairs            Wheelchair Mobility    Modified Rankin (Stroke Patients Only)       Balance Overall balance assessment: Needs assistance Sitting-balance support: Single extremity supported;Feet supported Sitting balance-Leahy Scale: Fair     Standing balance support: Bilateral upper extremity supported;During functional activity Standing balance-Leahy Scale: Poor Standing balance comment: Reliant on RW for stability in standing                             Pertinent Vitals/Pain Pain Assessment: 0-10 Pain Score: 7  Pain Location: bilateraly ankles and knees Pain Descriptors / Indicators: Throbbing Pain Intervention(s): Monitored during session;Premedicated before session    Home Living Family/patient expects to be discharged to:: Private residence Living Arrangements: Spouse/significant other Available Help at Discharge: Family;Available PRN/intermittently Type of Home: House Home Access: Level entry     Home Layout: Multi-level Home Equipment: Bedside commode;Crutches      Prior Function Level of Independence: Independent         Comments: was independent prior to admission and performing exercise at the  [employer]      Hand Dominance   Dominant Hand: Right    Extremity/Trunk Assessment   Upper Extremity Assessment Upper Extremity Assessment: Defer to OT evaluation    Lower Extremity Assessment Lower Extremity Assessment: Generalized weakness    Cervical / Trunk Assessment Cervical /  Trunk Assessment: Normal  Communication   Communication: No difficulties  Cognition Arousal/Alertness: Awake/alert Behavior During Therapy: WFL for tasks assessed/performed Overall Cognitive Status: Within Functional Limits for tasks assessed                                        General Comments General comments (skin integrity, edema, etc.): Orthostatic vitals taken and recorded in vitals sign section.     Exercises     Assessment/Plan    PT Assessment Patient needs continued PT services  PT Problem List Decreased strength;Decreased activity tolerance;Decreased balance;Decreased mobility;Decreased knowledge of use of DME;Pain       PT Treatment Interventions DME instruction;Gait training;Stair training;Functional mobility training;Therapeutic activities;Therapeutic exercise;Balance training    PT Goals (Current goals can be found in the Care Plan section)  Acute Rehab PT Goals Patient Stated Goal: to get back to walking without a device PT Goal Formulation: With patient/family Time For Goal Achievement: 03/09/17 Potential to Achieve Goals: Good    Frequency Min 3X/week   Barriers to discharge        Co-evaluation               AM-PAC PT "6 Clicks" Daily Activity  Outcome Measure Difficulty turning over in bed (including adjusting bedclothes, sheets and blankets)?: A Lot Difficulty moving from lying on back to sitting on the side of the bed? : A Lot Difficulty sitting down on and standing up from a chair with arms (e.g., wheelchair, bedside commode, etc,.)?: A Lot Help needed moving to and from a bed to chair (including a wheelchair)?: A Little Help needed walking in hospital room?: A Little Help needed climbing 3-5 steps with a railing? : A Lot 6 Click Score: 14    End of Session Equipment Utilized During Treatment: Gait belt Activity Tolerance: Patient tolerated treatment well Patient left: in bed;with call bell/phone within reach;with  family/visitor present Nurse Communication: Mobility status PT Visit Diagnosis: Muscle weakness (generalized) (M62.81);Difficulty in walking, not elsewhere classified (R26.2);Pain Pain - Right/Left: Left Pain - part of body: Ankle and joints of foot    Time: 1311-1355 PT Time Calculation (min) (ACUTE ONLY): 44 min   Charges:   PT Evaluation $PT Eval Moderate Complexity: 1 Procedure PT Treatments $Gait Training: 8-22 mins $Therapeutic Activity: 8-22 mins   PT G Codes:        Colin BroachSabra M. Khanh Cordner PT, DPT  [phone]   Ruel FavorsSabra Aletha HalimMarie Carisa Backhaus 03/02/2017, 2:53 PM

## 2017-03-02 NOTE — Progress Notes (Signed)
[facility] Alert and oriented X 4. Complains of pain in joints from gout. SR on telemetry. 1in X 1in ulcer to right lower leg. Swelling to fingers of left and right hands and lower legs and feet.  Lower extremity edema is 3 plus. Patient oriented to call system. General plan of care initiated.

## 2017-03-02 NOTE — Progress Notes (Signed)
   Patient seen earlier this morning.  Agree with plan.  Creat is better.  He has less pain.  HF will follow in the AM.

## 2017-03-03 DIAGNOSIS — M1A9XX1 Chronic gout, unspecified, with tophus (tophi): Secondary | ICD-10-CM | POA: Diagnosis not present

## 2017-03-03 DIAGNOSIS — M1000 Idiopathic gout, unspecified site: Secondary | ICD-10-CM | POA: Diagnosis not present

## 2017-03-03 DIAGNOSIS — N179 Acute kidney failure, unspecified: Secondary | ICD-10-CM | POA: Diagnosis not present

## 2017-03-03 DIAGNOSIS — M109 Gout, unspecified: Secondary | ICD-10-CM | POA: Diagnosis not present

## 2017-03-03 DIAGNOSIS — I5022 Chronic systolic (congestive) heart failure: Secondary | ICD-10-CM | POA: Diagnosis not present

## 2017-03-03 LAB — BASIC METABOLIC PANEL
ANION GAP: 9 (ref 5–15)
BUN: 99 mg/dL — AB (ref 6–20)
CALCIUM: 7.9 mg/dL — AB (ref 8.9–10.3)
CO2: 23 mmol/L (ref 22–32)
Chloride: 98 mmol/L — ABNORMAL LOW (ref 101–111)
Creatinine, Ser: 2.03 mg/dL — ABNORMAL HIGH (ref 0.61–1.24)
GFR calc Af Amer: 41 mL/min — ABNORMAL LOW (ref >60)
GFR, EST NON AFRICAN AMERICAN: 36 mL/min — AB (ref >60)
GLUCOSE: 200 mg/dL — AB (ref 65–99)
POTASSIUM: 4.3 mmol/L (ref 3.5–5.1)
SODIUM: 130 mmol/L — AB (ref 135–145)

## 2017-03-03 LAB — GLUCOSE, CAPILLARY
Glucose-Capillary: 207 mg/dL — ABNORMAL HIGH (ref 65–99)
Glucose-Capillary: 225 mg/dL — ABNORMAL HIGH (ref 65–99)

## 2017-03-03 LAB — DIGOXIN LEVEL: DIGOXIN LVL: 0.5 ng/mL — AB (ref 0.8–2.0)

## 2017-03-03 LAB — CBC WITH DIFFERENTIAL/PLATELET
BASOS ABS: 0 10*3/uL (ref 0.0–0.1)
Basophils Relative: 0 %
EOS ABS: 0 10*3/uL (ref 0.0–0.7)
EOS PCT: 0 %
HCT: 23.3 % — ABNORMAL LOW (ref 39.0–52.0)
Hemoglobin: 7.7 g/dL — ABNORMAL LOW (ref 13.0–17.0)
LYMPHS PCT: 6 %
Lymphs Abs: 0.5 10*3/uL — ABNORMAL LOW (ref 0.7–4.0)
MCH: 27.2 pg (ref 26.0–34.0)
MCHC: 33 g/dL (ref 30.0–36.0)
MCV: 82.3 fL (ref 78.0–100.0)
MONO ABS: 0.2 10*3/uL (ref 0.1–1.0)
Monocytes Relative: 3 %
Neutro Abs: 6.9 10*3/uL (ref 1.7–7.7)
Neutrophils Relative %: 91 %
PLATELETS: 302 10*3/uL (ref 150–400)
RBC: 2.83 MIL/uL — AB (ref 4.22–5.81)
RDW: 16.3 % — AB (ref 11.5–15.5)
WBC: 7.6 10*3/uL (ref 4.0–10.5)

## 2017-03-03 LAB — HEMOGLOBIN A1C
Hgb A1c MFr Bld: 6.1 % — ABNORMAL HIGH (ref 4.8–5.6)
Mean Plasma Glucose: 128 mg/dL

## 2017-03-03 LAB — PSA: PSA: 0.17 ng/mL (ref 0.00–4.00)

## 2017-03-03 MED ORDER — ISOSORB DINITRATE-HYDRALAZINE 20-37.5 MG PO TABS
1.0000 | ORAL_TABLET | Freq: Three times a day (TID) | ORAL | Status: DC
  Administered 2017-03-03 (×2): 1 via ORAL
  Filled 2017-03-03 (×2): qty 1

## 2017-03-03 MED ORDER — PREDNISONE 10 MG PO TABS: ORAL_TABLET | ORAL | 0 refills | Status: DC

## 2017-03-03 MED ORDER — ISOSORB DINITRATE-HYDRALAZINE 20-37.5 MG PO TABS: 1.0000 | ORAL_TABLET | Freq: Three times a day (TID) | ORAL | 0 refills | Status: DC

## 2017-03-03 NOTE — Discharge Summary (Addendum)
Physician Discharge Summary  Edd FabianHarry D Seres VWU:[id number] DOB: 1960-11-08 DOA: 03/01/2017  PCP: Joaquin CourtsFavero, John Patrick, DO  Admit date: 03/01/2017 Discharge date: 03/03/2017  Admitted From: Home  Disposition:  Home   Recommendations for Outpatient Follow-up:  1. Follow up with PCP in 1-2 weeks 2. Follow-up with Dr. Shirlee LatchMcLean in 2-4 days' time 3. Hold Entresto till cardiology evaluation as an outpatient. Restart torsemide 03/06/2017 as per cardiology recommendations.  Home Health: Yes Equipment/Devices: None  Discharge Condition: Stable CODE STATUS: Full Diet recommendation: Heart Healthy / Carb Modified   Brief/Interim Summary: 56 y.o.malewith medical history significant of severe tophaceous gout, DM2, HTN, CKD, CAD s/p CABG, and chronic systolic CHF EF 15 [facility] St Jude ICD 10/06/15 presented with gout flareup which did not improve with colchicine which caused diarrhea. He was admitted with gout flareup and acute kidney injury. Cardiology has evaluated the patient. Patient was started on IV fluids and oral prednisone. Torsemide and Entresto were held. Cardiology evaluated the patient today and cleared the patient for discharge with outpatient follow-up with cardiology in 2-4 days' time.   Discharge Diagnoses:  Active Problems:   DM (diabetes mellitus), type 2 (HCC)   Gout, unspecified   HYPERTENSION, BENIGN   CORONARY ARTERY BYPASS GRAFT, HX OF   Chronic systolic congestive heart failure (HCC)   CKD (chronic kidney disease) stage 3, GFR 30-59 ml/min   Cardiomyopathy (HCC)   AICD (automatic cardioverter/defibrillator) present   AKI (acute kidney injury) (HCC)   DM (diabetes mellitus), type 2 with renal complications (HCC)   Hyponatremia   Polyarticular gout   Renal insufficiency   Acute kidney injury (HCC)  1. Probable acute gout flare: Improved. Discharge the patient home on oral prednisone taper, 50 mg by mouth daily for 2 days then 40 mg by mouth  daily for 2 daysthen 30 mg by mouth daily for 2 days then 20 mg by mouth daily for 2 days then 10 mg by mouth daily for 2 days then discontinue.  Hold colchicine. Outpatient rheumatology follow-up  2. Acute kidney injury: Probably secondary to dehydration and decreased by mouth intake. Improving. Repeat BMP in a week.   3. Chronic systolic congestive heart failure: Cardiology evaluation appreciated. Hold entresto for till cardiology evaluation in 2-4 days' time. Restart torsemide on 03/06/2017 as per cardiology.  Patient already has an AICD. Continue Coreg. Decrease BiDil to 1 tab by mouth 3 times a day as per cardiology.  4. Chronic kidney disease stage III  5. Hypertension: Monitor blood pressure continue blood pressure medications as above.  6. Diabetes mellitus type II with hyperglycemia: Continue glipizide  7. History of coronary artery disease status post CABG  8. Leukocytosis: Probably reactive; resolved  9. Hyponatremia and hypochloremia: Probably secondary to dehydration, improving  10. Anemia probably from anemia of chronic disease from chronic disease: Stable hemoglobin  Discharge Instructions  Discharge Instructions    Call MD for:  difficulty breathing, headache or visual disturbances    Complete by:  As directed    Call MD for:  extreme fatigue    Complete by:  As directed    Call MD for:  hives    Complete by:  As directed    Call MD for:  persistant dizziness or light-headedness    Complete by:  As directed    Call MD for:  persistant nausea and vomiting    Complete by:  As directed    Call MD for:  severe uncontrolled  pain    Complete by:  As directed    Call MD for:  temperature >100.4    Complete by:  As directed    Diet - low sodium heart healthy    Complete by:  As directed    Diet Carb Modified    Complete by:  As directed    Increase activity slowly    Complete by:  As directed      Allergies as of 03/03/2017      Reactions    Spironolactone Other (See Comments)   Increases potassium levels      Medication List    STOP taking these medications   colchicine 0.6 MG tablet   ENTRESTO 49-51 MG Generic drug:  sacubitril-valsartan   torsemide 20 MG tablet Commonly known as:  DEMADEX     TAKE these medications   acetaminophen 500 MG tablet Commonly known as:  TYLENOL Take 500-1,000 mg by mouth every 4 (four) hours as needed for headache (pain). What changed:  Another medication with the same name was removed. Continue taking this medication, and follow the directions you see here.   aspirin EC 81 MG tablet Take 81 mg by mouth daily.   atorvastatin 80 MG tablet Commonly known as:  LIPITOR TAKE ONE TABLET BY MOUTH DAILY What changed:  how much to take  how to take this  when to take this  additional instructions   carvedilol 25 MG tablet Commonly known as:  COREG Take 1 tablet (25 mg total) by mouth 2 (two) times daily with a meal. Please schedule appointment for refills.   digoxin 0.125 MG tablet Commonly known as:  LANOXIN Take 0.5 tablets (0.0625 mg total) by mouth daily.   febuxostat 40 MG tablet Commonly known as:  ULORIC Take 120 mg by mouth daily.   glipiZIDE 5 MG tablet Commonly known as:  GLUCOTROL TAKE 1 TABLET (5 MG TOTAL) BY MOUTH 2 (TWO) TIMES DAILY BEFORE MEALS (REFER FUTURE REFILLS TO PRIMARY CARE PHYSICIAN)   isosorbide-hydrALAZINE 20-37.5 MG tablet Commonly known as:  BIDIL Take 1 tablet by mouth 3 (three) times daily. What changed:  how much to take   nitroGLYCERIN 0.4 MG SL tablet Commonly known as:  NITROSTAT Place 1 tablet (0.4 mg total) under the tongue every 5 (five) minutes as needed for chest pain.   predniSONE 10 MG tablet Commonly known as:  DELTASONE 50mg  daily for 2 days, 40mg  daily for 2 days, 30mg  daily for 2 days, 20mg  daily for 2 days, 10mg  daily for 2 days then discontinue      Follow-up Information    Joaquin Courts, DO Follow up in 1  week(s).   Specialty:  Family Medicine Contact information: [address] [place] [address] [phone]        Laurey Morale, MD Follow up in 3 day(s).   Specialty:  Cardiology Contact information: [address] [place] [address] [phone]          Allergies  Allergen Reactions  . Spironolactone Other (See Comments)    Increases potassium levels    Consultations: Cardiology  Procedures/Studies: None   Subjective: Patient seen and examined at bedside. He feels much better. His pain is improved and he denies any chest pain, shortness breath, nausea, vomiting.  Discharge Exam: Vitals:   03/03/17 0024 03/03/17 0554  BP: 100/64 106/60  Pulse: (!) 59 60  Resp: 18 18  Temp: 97.5 F (36.4 C) 97.3 F (36.3  C)   Vitals:   03/02/17 1031 03/02/17 2058 03/03/17 0024 03/03/17 0554  BP: 107/68 104/64 100/64 106/60  Pulse: 63 61 (!) 59 60  Resp:  16 18 18   Temp:  97.4 F (36.3 C) 97.5 F (36.4 C) 97.3 F (36.3 C)  TempSrc:  Oral Oral Oral  SpO2: 100% 100% 100% 100%  Weight:    94.5 kg (208 lb 6.4 oz)  Height:        General: Pt is alert, awake, not in acute distress Cardiovascular: Rate controlled, S1/S2 + Respiratory: Bilateral decreased breath sound at bases Abdominal: Soft, NT, ND, bowel sounds + Extremities: Trace pitting edema present, no cyanosis    The results of significant diagnostics from this hospitalization (including imaging, microbiology, ancillary and laboratory) are listed below for reference.     Microbiology: No results found for this or any previous visit (from the past 240 hour(s)).   Labs: BNP (last 3 results)  Recent Labs  07/09/16 1549 11/12/16 1130 12/17/16 1157  BNP 2,597.9* 895.5* 1,612.8*   Basic Metabolic Panel:  Recent Labs Lab 03/01/17 1802 03/02/17 0415 03/03/17 0458  NA 126* 128* 130*  K 4.1 4.3 4.3  CL 97* 97* 98*  CO2 18* 20* 23  GLUCOSE 229* 220* 200*  BUN  92* 91* 99*  CREATININE 2.28* 2.19* 2.03*  CALCIUM 8.2* 8.3* 7.9*  MG  --  2.1  --   PHOS  --  3.6  --    Liver Function Tests:  Recent Labs Lab 03/01/17 1802 03/02/17 0415  AST 41 35  ALT 20 20  ALKPHOS 113 123  BILITOT 1.0 1.1  PROT 6.0* 5.9*  ALBUMIN 2.4* 2.2*   No results for input(s): LIPASE, AMYLASE in the last 168 hours. No results for input(s): AMMONIA in the last 168 hours. CBC:  Recent Labs Lab 03/01/17 1802 03/02/17 0415 03/03/17 0458  WBC 12.9* 12.7* 7.6  NEUTROABS 11.5*  --  6.9  HGB 8.0* 8.9* 7.7*  HCT 24.9* 26.8* 23.3*  MCV 83.6 83.5 82.3  PLT 290 299 302   Cardiac Enzymes: No results for input(s): CKTOTAL, CKMB, CKMBINDEX, TROPONINI in the last 168 hours. BNP: Invalid input(s): POCBNP CBG:  Recent Labs Lab 03/01/17 2337 03/02/17 0754 03/02/17 1156 03/02/17 1619 03/02/17 2142  GLUCAP 166* 241* 204* 223* 236*   D-Dimer No results for input(s): DDIMER in the last 72 hours. Hgb A1c No results for input(s): HGBA1C in the last 72 hours. Lipid Profile No results for input(s): CHOL, HDL, LDLCALC, TRIG, CHOLHDL, LDLDIRECT in the last 72 hours. Thyroid function studies  Recent Labs  03/02/17 0415  TSH 2.637   Anemia work up No results for input(s): VITAMINB12, FOLATE, FERRITIN, TIBC, IRON, RETICCTPCT in the last 72 hours. Urinalysis    Component Value Date/Time   COLORURINE YELLOW 03/01/2017 2225   APPEARANCEUR HAZY (A) 03/01/2017 2225   LABSPEC 1.014 03/01/2017 2225   PHURINE 5.0 03/01/2017 2225   GLUCOSEU NEGATIVE 03/01/2017 2225   HGBUR NEGATIVE 03/01/2017 2225   BILIRUBINUR NEGATIVE 03/01/2017 2225   KETONESUR NEGATIVE 03/01/2017 2225   PROTEINUR NEGATIVE 03/01/2017 2225   UROBILINOGEN 0.2 11/22/2010 2213   NITRITE NEGATIVE 03/01/2017 2225   LEUKOCYTESUR NEGATIVE 03/01/2017 2225   Sepsis Labs Invalid input(s): PROCALCITONIN,  WBC,  LACTICIDVEN Microbiology No results found for this or any previous visit (from the past 240  hour(s)).   Time coordinating discharge: 35 minutes  SIGNED:   Glade Lloyd, MD  [facility] 03/03/2017, 9:32 AM Pager:  [phone]  If 7PM-7AM, please contact night-coverage [email or web address] Password [email or web address]

## 2017-03-03 NOTE — Progress Notes (Signed)
Pt discharge education went over at bedside with pt. Pt IV discontinued, catheter intact and telemetry removed. Pt has all belongings packed at bedside. Awaiting pt ride for discharge home  James [name] [name]

## 2017-03-03 NOTE — Progress Notes (Signed)
Pt refused blood pressure to be taken states it was completed this morning. Pt informed and educated on frequent vitals for admissions. Pt also educated on how to order breakfast and reviewed the menu, pt states no one explained prior to him   James [name] RadonBradley

## 2017-03-03 NOTE — Evaluation (Signed)
Occupational Therapy Evaluation Patient Details Name: James [name] MRN: [id number] DOB: 28-Oct-1960 Today's Date: 03/03/2017    History of Present Illness Pt is a 56 yo male admitted through the ED following a 2 week exacerbation of severe tophaceous gout. Pt has a AICD present and is on a list awaiting a heart transplant as he has a 15% EF. PMH significant for DM2, HTN, CKD, CAD s/p CABG.    Clinical Impression   PT admitted with exacerbation of severe tophaceous gout. Pt currently with functional limitiations due to the deficits listed below (see OT problem list). Pt will have wife (A) for peri care and LB dressing per patient.  Pt will benefit from skilled OT to increase their independence and safety with adls and balance to allow discharge HHOT. Defer all care to [facility] with d/c pending today. Educated on energy conservation during ADL with seated position.      Follow Up Recommendations  Home health OT    Equipment Recommendations  None recommended by OT    Recommendations for Other Services       Precautions / Restrictions Precautions Precautions: Fall Restrictions Weight Bearing Restrictions: No      Mobility Bed Mobility Overal bed mobility: Needs Assistance Bed Mobility: Supine to Sit     Supine to sit: Supervision Sit to supine: Supervision   General bed mobility comments: requires increase time and bed rail  Transfers Overall transfer level: Needs assistance Equipment used: Rolling walker (2 wheeled) Transfers: Sit to/from Stand Sit to Stand: Min assist;From elevated surface         General transfer comment: pt mod cues for hand placement and rocking momentum    Balance Overall balance assessment: Needs assistance Sitting-balance support: Bilateral upper extremity supported;Feet supported Sitting balance-Leahy Scale: Good     Standing balance support: Bilateral upper extremity supported;During functional activity Standing balance-Leahy Scale:  Poor Standing balance comment: pt benefits from seated position for adls. pt able to pull up pants with one hand support on sink or RW                           ADL either performed or assessed with clinical judgement   ADL Overall ADL's : Needs assistance/impaired     Grooming: Minimal assistance;Sitting Grooming Details (indicate cue type and reason): pt able to hold tooth brush but needed ()A for the paste Upper Body Bathing: Supervision/ safety   Lower Body Bathing: Moderate assistance;Sit to/from stand Lower Body Bathing Details (indicate cue type and reason): requires (A) for peri care but able to complete thighs and anterior peri in seated position Upper Body Dressing : Minimal assistance   Lower Body Dressing: Moderate assistance Lower Body Dressing Details (indicate cue type and reason): needed (A) to thread bil LE into pant legs Toilet Transfer: Min Emergency planning/management officer Details (indicate cue type and reason): requires rocking momentum to complete sit<>Stand         Functional mobility during ADLs: Min guard General ADL Comments: pt requires bed eleveated for transfer and mod cues for hand placement. OT total (A) don of socks. Pt noted to have bil LE edema     Vision   Vision Assessment?: No apparent visual deficits     Perception     Praxis      Pertinent Vitals/Pain Pain Assessment: No/denies pain Pain Score: 7  Pain Location: B ankles Pain Descriptors / Indicators: Throbbing Pain Intervention(s): Limited activity within patient's tolerance;Monitored during  session;Repositioned     Hand Dominance Right   Extremity/Trunk Assessment Upper Extremity Assessment Upper Extremity Assessment: RUE deficits/detail;LUE deficits/detail RUE Deficits / Details: decr flexion of digits limiting fine motor task like opening tooth paste LUE Deficits / Details: fine motor deficits due to decr digit flexion   Lower Extremity Assessment Lower Extremity  Assessment: Defer to PT evaluation   Cervical / Trunk Assessment Cervical / Trunk Assessment: Normal   Communication Communication Communication: No difficulties   Cognition Arousal/Alertness: Awake/alert Behavior During Therapy: WFL for tasks assessed/performed Overall Cognitive Status: Within Functional Limits for tasks assessed                                     General Comments       Exercises     Shoulder Instructions      Home Living Family/patient expects to be discharged to:: Private residence Living Arrangements: Spouse/significant other Available Help at Discharge: Family;Available PRN/intermittently Type of Home: House Home Access: Level entry     Home Layout: Multi-level Alternate Level Stairs-Number of Steps: 6 Alternate Level Stairs-Rails: Right;Left Bathroom Shower/Tub: Producer, television/film/videoWalk-in shower   Bathroom Toilet: Handicapped height     Home Equipment: Bedside commode;Crutches   Additional Comments: pt reports deficits with peri care due to hand strength at times      Prior Functioning/Environment Level of Independence: Independent        Comments: was independent prior to admission [facility]         OT Problem List: Decreased strength;Decreased activity tolerance;Impaired balance (sitting and/or standing);Decreased safety awareness;Decreased knowledge of use of DME or AE;Decreased knowledge of precautions;Obesity;Pain;Cardiopulmonary status limiting activity      OT Treatment/Interventions:      OT Goals(Current goals can be found in the care plan section) Acute Rehab OT Goals Patient Stated Goal: to get back to walking without a device Potential to Achieve Goals: Good  OT Frequency:     Barriers to D/C:            Co-evaluation              AM-PAC PT "6 Clicks" Daily Activity     Outcome Measure Help from another person eating meals?: A Little Help from another person taking care of personal  grooming?: A Little Help from another person toileting, which includes using toliet, bedpan, or urinal?: A Lot Help from another person bathing (including washing, rinsing, drying)?: A Lot Help from another person to put on and taking off regular upper body clothing?: A Little Help from another person to put on and taking off regular lower body clothing?: A Lot 6 Click Score: 15   End of Session Equipment Utilized During Treatment: Gait belt;Rolling walker Nurse Communication: Mobility status;Precautions  Activity Tolerance: Patient tolerated treatment well Patient left: in bed;with call bell/phone within reach;with bed alarm set  OT Visit Diagnosis: Unsteadiness on feet (R26.81)                Time: [id number] OT Time Calculation (min): 32 min Charges:  OT General Charges $OT Visit: 1 Procedure OT Evaluation $OT Eval Moderate Complexity: 1 Procedure OT Treatments $Self Care/Home Management : 8-22 mins G-Codes:      Mateo FlowJones, Brynn   OTR/L Pager: [phone] Office: [phone] .   Boone MasterJones, Jourdon Zimmerle B 03/03/2017, 2:55 PM

## 2017-03-03 NOTE — Care Management Note (Signed)
Case Management Note  Patient Details  Name: James [name] MRN: [id number] Date of Birth: 01-24-1961  Subjective/Objective:     Admitted with CHF              Action/Plan: Patient lives at home with spouse; PCP: Joaquin CourtsFavero, John Patrick, DO; has private insurance with [facility] with prescription drug coverage; pharmacy of choice is [facility]; patient stated that he does not want any HHC at this time. CM informed patient that if he changed his mind, his PCP can make the arrangements from his office.  Expected Discharge Date:  03/03/17               Expected Discharge Plan:  Home w Home Health Services  Discharge planning Services  CM Consult  Choice offered to:  Patient  HH Arranged:  Patient Refused HH  Status of Service:  In process, will continue to follow  Cherrie DistanceChandler, Reyana Leisey L, RN ,MHA,BSN [phone] 03/03/2017, 12:09 PM

## 2017-03-03 NOTE — Discharge Instructions (Addendum)
Hold Entresto till Cardiology followup. Restart Torsemide on 03/06/17 as per Cardiology.

## 2017-03-03 NOTE — Progress Notes (Signed)
Patient ID: James [name], male   DOB: Oct 11, 1961, 56 y.o.   MRN: [id number]   SUBJECTIVE: Gout pain much improved.  He was walking the halls yesterday.  Creatinine down to 2.03 though BUN still elevated.   Scheduled Meds: . aspirin EC  81 mg Oral Daily  . atorvastatin  80 mg Oral QHS  . carvedilol  25 mg Oral BID WC  . digoxin  0.0625 mg Oral Daily  . enoxaparin (LOVENOX) injection  40 mg Subcutaneous Q24H  . febuxostat  120 mg Oral Daily  . insulin aspart  0-5 Units Subcutaneous QHS  . insulin aspart  0-9 Units Subcutaneous TID WC  . isosorbide-hydrALAZINE  1 tablet Oral TID  . predniSONE  60 mg Oral Q breakfast  . sodium chloride flush  3 mL Intravenous Q12H  . sodium chloride flush  3 mL Intravenous Q12H   Continuous Infusions: . sodium chloride     PRN Meds:.sodium chloride, acetaminophen **OR** acetaminophen, alum & mag hydroxide-simeth, ondansetron **OR** ondansetron (ZOFRAN) IV, sodium chloride flush    Vitals:   03/02/17 1031 03/02/17 2058 03/03/17 0024 03/03/17 0554  BP: 107/68 104/64 100/64 106/60  Pulse: 63 61 (!) 59 60  Resp:  16 18 18   Temp:  97.4 F (36.3 C) 97.5 F (36.4 C) 97.3 F (36.3 C)  TempSrc:  Oral Oral Oral  SpO2: 100% 100% 100% 100%  Weight:    208 lb 6.4 oz (94.5 kg)  Height:        Intake/Output Summary (Last 24 hours) at 03/03/17 0749 Last data filed at 03/03/17 0557  Gross per 24 hour  Intake           773.33 ml  Output              925 ml  Net          -151.67 ml    LABS: Basic Metabolic Panel:  Recent Labs  40/98/11 0415 03/03/17 0458  NA 128* 130*  K 4.3 4.3  CL 97* 98*  CO2 20* 23  GLUCOSE 220* 200*  BUN 91* 99*  CREATININE 2.19* 2.03*  CALCIUM 8.3* 7.9*  MG 2.1  --   PHOS 3.6  --    Liver Function Tests:  Recent Labs  03/01/17 1802 03/02/17 0415  AST 41 35  ALT 20 20  ALKPHOS 113 123  BILITOT 1.0 1.1  PROT 6.0* 5.9*  ALBUMIN 2.4* 2.2*   No results for input(s): LIPASE, AMYLASE in the last 72  hours. CBC:  Recent Labs  03/01/17 1802 03/02/17 0415 03/03/17 0458  WBC 12.9* 12.7* 7.6  NEUTROABS 11.5*  --  6.9  HGB 8.0* 8.9* 7.7*  HCT 24.9* 26.8* 23.3*  MCV 83.6 83.5 82.3  PLT 290 299 302   Cardiac Enzymes: No results for input(s): CKTOTAL, CKMB, CKMBINDEX, TROPONINI in the last 72 hours. BNP: Invalid input(s): POCBNP D-Dimer: No results for input(s): DDIMER in the last 72 hours. Hemoglobin A1C: No results for input(s): HGBA1C in the last 72 hours. Fasting Lipid Panel: No results for input(s): CHOL, HDL, LDLCALC, TRIG, CHOLHDL, LDLDIRECT in the last 72 hours. Thyroid Function Tests:  Recent Labs  03/02/17 0415  TSH 2.637   Anemia Panel: No results for input(s): VITAMINB12, FOLATE, FERRITIN, TIBC, IRON, RETICCTPCT in the last 72 hours.  RADIOLOGY: No results found.  PHYSICAL EXAM General: NAD Neck: JVP 8-9 cm, no thyromegaly or thyroid nodule.  Lungs: Clear to auscultation bilaterally with normal respiratory effort. CV: Nondisplaced PMI.  Heart regular S1/S2, no S3/S4, no murmur.  No peripheral edema.   Abdomen: Soft, nontender, no hepatosplenomegaly, no distention.  Neurologic: Alert and oriented x 3.  Psych: Normal affect. Extremities: No clubbing or cyanosis. Tophaceous gout in fingers.   TELEMETRY: Reviewed telemetry pt in NSR  ASSESSMENT AND PLAN: 56 yo with chronic systolic CHF/ischemic cardiomyopathy, CAD s/p CABG, severe tophaceous gout, CKD stage III and elevated PSA was admitted with acute gouty flare in knees and ankles as well as diarrhea after taking colchicine.  He was found to have AKI.  1. Chronic systolic CHF: Ischemic cardiomyopathy, EF 15% on 9/17 echo.  Has St Jude ICD, has been undergoing transplant workup but holding for now with elevated PSA.  Entresto and torsemide held with elevated creatinine.  Today, he appears to be mildly volume overloaded on exam. Weight is up some from admission.  Creatinine and BUN still above baseline.  -  Continue to hold Entresto.  - Soft BP, cut Bidil to 1 tab tid.   - Continue Coreg.  - Continue digoxin but check level this morning.  - Hold torsemide for now, probably restart in a couple of days.  Stop IV fluid.  2. AKI: In setting of diarrhea from colchicine + ongoing use of torsemide and Entresto.  Baseline creatinine around 1.6-1.8, CKD stage III. He has received gentle IV fluid.  Probably mild volume overload at this point.  - Stop IV fluid.  - As above, continue to hold Entresto and torsemide.  3. Acute gouty flare: Cannot take colchicine due to severe diarrhea.  Has been on Uloric.  He is getting a prednisone burst, symptoms much improved.  Will have to watch for volume overload on prednisone.  4. CAD: s/p CABG.  Continue ASA and statin.  5. Elevated PSA: ?Prostatitis given rapid rise from recent normal.  [place]  - Recheck PSA.  6. Disposition: Feeling much better in terms of gout.  Really wants to leave today so that he can make his urology appointment tomorrow.  Elevated PSA is currently keeping him off heart transplant list.  I think he can go home today but will need very close outpatient followup, we will need to see him later this week to make sure he does not get significantly volume overloaded.  Would hold torsemide for now, restart torsemide 20 mg daily on Thursday.  Hold Entresto until followup.  Meds for home: Torsemide 20 mg daily starting Thursday, Bidil 1 tab tid, digoxin 0.0625 daily, ASA 81, atorvastatin 80 daily, Coreg 25 bid, Uloric, prednisone taper per triad.   Marca AnconaDalton Tytianna Greenley 03/03/2017 8:00 AM

## 2017-03-03 NOTE — Progress Notes (Signed)
Physical Therapy Treatment Patient Details Name: James [name] MRN: [id number] DOB: July 04, 1961 Today's Date: [name]    History of Present Illness Pt is a 56 yo male admitted through the ED following a 2 week exacerbation of severe tophaceous gout. Pt has a AICD present and is on a list awaiting a heart transplant as he has a 15% EF. PMH significant for DM2, HTN, CKD, CAD s/p CABG.     PT Comments    Lengthy discussion with pt regarding home set up.  Con't to recommend HHPT for home safety assessment and also discussed adding 2nd rail at home for safety.   Follow Up Recommendations  Home health PT;Supervision for mobility/OOB     Equipment Recommendations  None recommended by PT    Recommendations for Other Services       Precautions / Restrictions Precautions Precautions: Fall Restrictions Weight Bearing Restrictions: No    Mobility  Bed Mobility   Bed Mobility: Supine to Sit     Supine to sit: Supervision;HOB elevated        Transfers Overall transfer level: Needs assistance Equipment used: Rolling walker (2 wheeled) Transfers: Sit to/from Stand Sit to Stand: Min guard;From elevated surface         General transfer comment: min/guard, but used rocking momentum to achieve standing from elevated bed  Ambulation/Gait Ambulation/Gait assistance: Min guard;Supervision Ambulation Distance (Feet): 160 Feet Assistive device: Rolling walker (2 wheeled) Gait Pattern/deviations: Step-through pattern;Decreased step length - right;Decreased step length - left;Trunk flexed;Wide base of support Gait velocity: decreased   General Gait Details: cues for posture   Stairs Stairs: Yes   Stair Management: Two rails;Step to pattern;Forwards Number of Stairs: 2 General stair comments: Pt able to perform stairs with min/guard with 2 rails. At home, uses rail and crutch.  Lengthy discussion with pt regarding home stair set up and rails.  Pt would like the HHPT to assess  the  set up and is thinking of putting up 2nd rail  Wheelchair Mobility    Modified Rankin (Stroke Patients Only)       Balance Overall balance assessment: Needs assistance Sitting-balance support: Feet supported Sitting balance-Leahy Scale: Good     Standing balance support: Bilateral upper extremity supported;During functional activity Standing balance-Leahy Scale: Poor Standing balance comment: requires RW                            Cognition Arousal/Alertness: Awake/alert Behavior During Therapy: WFL for tasks assessed/performed Overall Cognitive Status: Within Functional Limits for tasks assessed                                        Exercises      General Comments        Pertinent Vitals/Pain Pain Assessment: 0-10 Pain Score: 7  Pain Location: B ankles Pain Descriptors / Indicators: Throbbing Pain Intervention(s): Limited activity within patient's tolerance;Monitored during session;Repositioned    Home Living                      Prior Function            PT Goals (current goals can now be found in the care plan section) Acute Rehab PT Goals Patient Stated Goal: to get back to walking without a device PT Goal Formulation: With patient Time For Goal Achievement: 03/09/17 Potential to  Achieve Goals: Good Progress towards PT goals: Progressing toward goals    Frequency    Min 3X/week      PT Plan Current plan remains appropriate    Co-evaluation              AM-PAC PT "6 Clicks" Daily Activity  Outcome Measure  Difficulty turning over in bed (including adjusting bedclothes, sheets and blankets)?: A Lot Difficulty moving from lying on back to sitting on the side of the bed? : A Lot Difficulty sitting down on and standing up from a chair with arms (e.g., wheelchair, bedside commode, etc,.)?: A Lot Help needed moving to and from a bed to chair (including a wheelchair)?: A Little Help needed walking in  hospital room?: A Little Help needed climbing 3-5 steps with a railing? : A Little 6 Click Score: 15    End of Session Equipment Utilized During Treatment: Gait belt Activity Tolerance: Patient tolerated treatment well Patient left: in bed (sitting EOB)   PT Visit Diagnosis: Muscle weakness (generalized) (M62.81);Difficulty in walking, not elsewhere classified (R26.2);Pain Pain - Right/Left: Left Pain - part of body: Ankle and joints of foot     Time: 4098-11911224-1255 PT Time Calculation (min) (ACUTE ONLY): 31 min  Charges:  $Gait Training: 23-37 mins                    G Codes:       James [name], James [name]    Enzo MontgomeryKaren L Imanuel Pruiett [name], 2:15 PM

## 2017-03-03 NOTE — Progress Notes (Signed)
Inpatient Diabetes Program Recommendations  AACE/ADA: New Consensus Statement on Inpatient Glycemic Control (2015)  Target Ranges:  Prepandial:   less than 140 mg/dL      Peak postprandial:   less than 180 mg/dL (1-2 hours)      Critically ill patients:  140 - 180 mg/dL   Results for James [name], James [name] (MRN [id number]) as of 03/03/2017 09:24  Ref. Range 03/02/2017 07:54 03/02/2017 11:56 03/02/2017 16:19 03/02/2017 21:42  Glucose-Capillary Latest Ref Range: 65 - 99 mg/dL 409241 (H) 811204 (H) 914223 (H) 236 (H)   Results for James [name], James [name] (MRN [id number]) as of 03/03/2017 09:24  Ref. Range 03/03/2017 04:58  Glucose Latest Ref Range: 65 - 99 mg/dL 086200 (H)    Admit with: Gout Flare  History: DM, CKD, CHF  Home DM Meds: Glipizide 5 mg BID  Current Insulin Orders: Novolog Sensitive Correction Scale/ SSI (0-9 units) TID AC + HS      MD- Note patient currently receiving Prednisone 60 mg daily.  CBGs >200 mg/dl.  Home Glipizide on hold at present.  Current A1c level pending.  Please consider the following in-hospital insulin adjustments:  1. Start basal insulin- Levemir 10 units daily (0.1 units/kg dosing based on weight of 94 kg)  2. Increase Novolog SSI to Moderate scale (0-15 units) TID AC + HS     --Will follow patient during hospitalization--  Ambrose FinlandJeannine Johnston Laurrie Toppin RN, MSN, CDE Diabetes Coordinator Inpatient Glycemic Control Team Team Pager: [phone] (8a-5p)

## 2017-03-03 NOTE — Progress Notes (Signed)
MD stopped in hallway stated to have pt blood drawn prior to medication administration, phlebotomy at bedside now  James [name]

## 2017-03-03 NOTE — Progress Notes (Signed)
Pt wife at bedside. Discharge education went over with pt and wife. Pt has all belongings packed up including discharge paperwork. Pt discharged via wheelchair with RN.  James [name]

## 2017-03-04 ENCOUNTER — Telehealth (HOSPITAL_COMMUNITY): Payer: Self-pay | Admitting: Cardiology

## 2017-03-04 NOTE — Telephone Encounter (Signed)
Patient left voicemail requesting lab results and follow up for Friday 5/18  Left message for patient to return call

## 2017-03-05 NOTE — Telephone Encounter (Signed)
Discharge medications reviewed Follow up 5/17 @ 3 with Joanell RisingAndy tillery,PA  Patient aware and reports he has parking code

## 2017-03-06 ENCOUNTER — Encounter (HOSPITAL_COMMUNITY): Payer: Self-pay

## 2017-03-06 ENCOUNTER — Ambulatory Visit (HOSPITAL_COMMUNITY)
Admission: RE | Admit: 2017-03-06 | Discharge: 2017-03-06 | Disposition: A | Discharge status: Home / Self Care | Payer: Medicare PPO | Source: Ambulatory Visit | Attending: Internal Medicine | Admitting: Internal Medicine

## 2017-03-06 VITALS — BP 112/70 | HR 65 | Ht 72.0 in | Wt 211.0 lb

## 2017-03-06 DIAGNOSIS — I509 Heart failure, unspecified: Secondary | ICD-10-CM

## 2017-03-06 DIAGNOSIS — N179 Acute kidney failure, unspecified: Secondary | ICD-10-CM | POA: Insufficient documentation

## 2017-03-06 DIAGNOSIS — Z7984 Long term (current) use of oral hypoglycemic drugs: Secondary | ICD-10-CM | POA: Diagnosis not present

## 2017-03-06 DIAGNOSIS — E1122 Type 2 diabetes mellitus with diabetic chronic kidney disease: Secondary | ICD-10-CM | POA: Insufficient documentation

## 2017-03-06 DIAGNOSIS — I4510 Unspecified right bundle-branch block: Secondary | ICD-10-CM | POA: Insufficient documentation

## 2017-03-06 DIAGNOSIS — Z95810 Presence of automatic (implantable) cardiac defibrillator: Secondary | ICD-10-CM | POA: Insufficient documentation

## 2017-03-06 DIAGNOSIS — I2510 Atherosclerotic heart disease of native coronary artery without angina pectoris: Secondary | ICD-10-CM | POA: Diagnosis not present

## 2017-03-06 DIAGNOSIS — Z7982 Long term (current) use of aspirin: Secondary | ICD-10-CM | POA: Insufficient documentation

## 2017-03-06 DIAGNOSIS — Z951 Presence of aortocoronary bypass graft: Secondary | ICD-10-CM | POA: Diagnosis not present

## 2017-03-06 DIAGNOSIS — M1000 Idiopathic gout, unspecified site: Secondary | ICD-10-CM

## 2017-03-06 DIAGNOSIS — I130 Hypertensive heart and chronic kidney disease with heart failure and stage 1 through stage 4 chronic kidney disease, or unspecified chronic kidney disease: Secondary | ICD-10-CM | POA: Diagnosis not present

## 2017-03-06 DIAGNOSIS — E785 Hyperlipidemia, unspecified: Secondary | ICD-10-CM | POA: Insufficient documentation

## 2017-03-06 DIAGNOSIS — I255 Ischemic cardiomyopathy: Secondary | ICD-10-CM | POA: Diagnosis not present

## 2017-03-06 DIAGNOSIS — I5022 Chronic systolic (congestive) heart failure: Secondary | ICD-10-CM | POA: Diagnosis present

## 2017-03-06 DIAGNOSIS — N183 Chronic kidney disease, stage 3 unspecified: Secondary | ICD-10-CM

## 2017-03-06 DIAGNOSIS — M109 Gout, unspecified: Secondary | ICD-10-CM | POA: Insufficient documentation

## 2017-03-06 LAB — BASIC METABOLIC PANEL
Anion gap: 9 (ref 5–15)
BUN: 105 mg/dL — AB (ref 6–20)
CALCIUM: 8.3 mg/dL — AB (ref 8.9–10.3)
CHLORIDE: 100 mmol/L — AB (ref 101–111)
CO2: 20 mmol/L — ABNORMAL LOW (ref 22–32)
CREATININE: 1.79 mg/dL — AB (ref 0.61–1.24)
GFR, EST AFRICAN AMERICAN: 48 mL/min — AB (ref >60)
GFR, EST NON AFRICAN AMERICAN: 42 mL/min — AB (ref >60)
Glucose, Bld: 285 mg/dL — ABNORMAL HIGH (ref 65–99)
Potassium: 4.7 mmol/L (ref 3.5–5.1)
SODIUM: 129 mmol/L — AB (ref 135–145)

## 2017-03-06 MED ORDER — TORSEMIDE 20 MG PO TABS: 20.0000 mg | ORAL_TABLET | Freq: Every day | ORAL | 3 refills | Status: DC

## 2017-03-06 NOTE — Patient Instructions (Signed)
RESTART Torsemide 20 mg, one tab daily  Labs today We will only contact you if something comes back abnormal or we need to make some changes. Otherwise no news is good news!   Your physician recommends that you schedule a follow-up appointment as scheduled with Dr Shirlee LatchMcLean  Do the following things EVERYDAY: 1) Weigh yourself in the morning before breakfast. Write it down and keep it in a log. 2) Take your medicines as prescribed 3) Eat low salt foods-Limit salt (sodium) to 2000 mg per day.  4) Stay as active as you can everyday 5) Limit all fluids for the day to less than 2 liters

## 2017-03-06 NOTE — Progress Notes (Signed)
Patient ID: James [name], male   DOB: 03-26-1961, 56 y.o.   MRN: [id number]     Advanced Heart Failure Clinic Note   PCP: Dr. Michel Santee Rheumatology:  Dr. Dierdre Forth HF Cardiology: Dr. Shirlee Latch  56 yo with history of gout, DM2, HTN, CKD, CAD s/p CABG, and chronic systolic CHF presents for CHF clinic evaluation.  He had CABG x 6 in 2/12.  He has a long-standing ischemic cardiomyopathy, echo in 10/16 with EF 20-25%.  He also has severe tophaceous gout.     Now s/p St Jude ICD 10/06/15.  9/17 echo with EF 15%, mild to moderately decreased RV systolic function.  CPX in 12/17 showed severe HF limitation.  RHC in 12/17 showed mildly elevated filling pressures with preserved cardiac output.   Patient was seen at [facility] 4/18 for transplant evaluation.  RHC showed CI remained preserved at 3.2.  He was thought to be a reasonable candidate except PSA was noted to be up to 30 from 0.3 in 1/18.  He does not have prostatitis-type pain.    Admitted last week with polyarticular gout. Got some fluids and sent home on prednisone taper.  Pt presents today for post hospital follow up. Gout is somewhat improved but still with symptoms. He denies SOB but activity is severely limited with gout flare, exquisitely painful even walking short distances. Weight has been stable at home at 206 lbs despite prednisone and holding torsemide. Resumed torsemide 20 mg daily this am as directed. Denies lightheadedness or dizziness. No CP. No fevers, chills, N/V or diarrhea.   ECG: NSR, RBBB, 1st degree AVB  Labs (8/16): HCT 38.9, K 4.9, creatinine 1.81 Labs (08/01/2015) : K 3.7 Creatinine 1.49, BNP 658, LDL 85, HDL 65 Labs (09/18/2015): K 5.1 Creatinine 1.5 Labs (02/23/2016) K 4.6, Creatinine 1.58 Labs (03/01/2016): K 3.8, Creatinine 1.53 Labs (7/17): K 4.3, creatinine 1.53 Labs (9/17): K 4, creatinine 1.75, hgb 10.9 Labs (10/17): K 4.6, creatinine 1.6, digoxin 0.9 Labs (1/18): hgb 9.7, creatinine 1.78, BUN 63, K 5.6, albumin 3.4,  AST/ALT normal, PSA 0.3 Labs (2/18): K 5, creatinine 1.93 Labs (3/18): K 4.8, creatinine 1.67, digoxin 0.6 Labs (4/18): K 4.2, creatinine 1.8, PSA 30  PMH: 1. Tophaceous gout: Sees Dr Dierdre Forth.  2. Hyperlipidemia 3. CKD 4. Type II diabetes 5. CAD: s/p CABG 2/12 with sequential LIMA-mid and distal LAD, sequential SVG to OM1/2/3, SVG-dRCA.   6. Chronic systolic CHF: Ischemic cardiomyopathy. Echo 2013 with EF 25%.  Echo (10/16) with EF 15-20%, restrictive diastolic function, moderately dilated RV with flattened interventricular septum, mild MR.   - CPX (10/16) with peak VO2 11.5, VE/VCO2 slope 33.6, RER 1.19 => moderate to severe HF limitation.  - Echo (9/17): EF 15% with mild LV dilation, mild RV dilation with mild to moderately decreased systolic function, moderate MR, PASP 69 mmHg, IVC dilated.  - St Jude ICD.  - Echo (9/17) with EF 15%, mild LV dilation, diffuse hypokinesis, mildly dilated RV with mild to moderately decreased systolic function, PASP 69 mmHg.  - RHC (9/17) with mean RA 14, PA 70/35 mean 49, mean PCWP 24, RA/PCWP 0.5, CI 2.14 Fick/2.17 thermo, PVR 5.  - CPX (12/17) with peak VO2 8.9 (26% predicted), VE/VCO2 slope 51, RER 1.12 => severe HF limitation.  - RHC (12/17): mean RA 7, PA 62/22 mean 38, mean PCWP 18, CI 3.19 Fick/PVR 2.9 WU, CI 3.56 thermo/PVR 2.65 WU.  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    FH: HTN.  Father with CVA.   ROS: All systems reviewed and negative except as per HPI.   Current Outpatient Prescriptions  Medication Sig Dispense Refill  . acetaminophen (TYLENOL) 500 MG tablet Take 500-1,000 mg by mouth every 4 (four) hours as needed for headache (pain).    Marland Kitchen. aspirin EC 81 MG tablet Take 81 mg by mouth daily.    Marland Kitchen. atorvastatin (LIPITOR) 80 MG tablet TAKE ONE TABLET BY MOUTH DAILY (Patient taking differently: Take 80 mg by mouth at bedtime. ) 90 tablet 3  . carvedilol  (COREG) 25 MG tablet Take 1 tablet (25 mg total) by mouth 2 (two) times daily with a meal. Please schedule appointment for refills. 180 tablet 3  . digoxin (LANOXIN) 0.125 MG tablet Take 0.5 tablets (0.0625 mg total) by mouth daily. 45 tablet 3  . febuxostat (ULORIC) 40 MG tablet Take 120 mg by mouth daily.    Marland Kitchen. glipiZIDE (GLUCOTROL) 5 MG tablet TAKE 1 TABLET (5 MG TOTAL) BY MOUTH 2 (TWO) TIMES DAILY BEFORE MEALS (REFER FUTURE REFILLS TO PRIMARY CARE PHYSICIAN) 180 tablet 0  . isosorbide-hydrALAZINE (BIDIL) 20-37.5 MG tablet Take 1 tablet by mouth 3 (three) times daily. 90 tablet 0  . nitroGLYCERIN (NITROSTAT) 0.4 MG SL tablet Place 1 tablet (0.4 mg total) under the tongue every 5 (five) minutes as needed for chest pain. 25 tablet 2  . predniSONE (DELTASONE) 10 MG tablet 50mg  daily for 2 days, 40mg  daily for 2 days, 30mg  daily for 2 days, 20mg  daily for 2 days, 10mg  daily for 2 days then discontinue 30 tablet 0   No current facility-administered medications for this encounter.    BP 112/70   Pulse 65   Ht 6' (1.829 m)   Wt 211 lb (95.7 kg) Comment: UTO  due to GOUT flare  SpO2 100%   BMI 28.62 kg/m    Wt Readings from Last 3 Encounters:  03/06/17 211 lb (95.7 kg)  03/03/17 208 lb 6.4 oz (94.5 kg)  02/18/17 206 lb 8 oz (93.7 kg)    General: Fatigued appearing. NAD.  HEENT: normal Neck: supple. JVP 6-7 cm. Carotids 2+ bilat; no bruits. No thyromegaly or nodule noted. Cor: PMI nondisplaced. RRR, No M/G/R noted Lungs: CTAB, normal effort. Abdomen: soft, non-tender, distended, no HSM. No bruits or masses. +BS  Extremities: no cyanosis, clubbing, rash, R and LLE no edema. Bilateral hands with significant gouty changes and prominent tophi. Trace ankle edema.  Neuro: alert & orientedx3, cranial nerves grossly intact. moves all 4 extremities w/o difficulty. Affect pleasant   Assessment/Plan: 1. Chronic systolic CHF: Ischemic cardiomyopathy.  EF 25% in 2013, EF 20-25% on 10/16 echo with  evidence for RV volume overload.  Most recent echo was done in 9/17, showing EF 15%, mildly dilated RV with mild to moderately decreased RV systolic function.  Severe functional deficit by CPX in 12/17, worse than prior study.  He has a Secondary school teachert Jude ICD.  He is not volume overloaded on exam, stable NYHA class III symptoms.  [facility] 12/17 [facility] showed only mildly elevated filling pressures and preserved cardiac output.   - NYHA III likely but difficult to assess with gout flare.  - Resume torsemide 20 mg daily.  - Continue to hold entresto for now with soft pressures and recent AKI.  - Continue current Coreg 25 mg twice a day .  - Continue Bidil 1.5 tabs tid,  no BP room to titrate up.    - He is off spironolactone given elevated K and AKI.  - Continue digoxin 0.0625 daily, Level 0.5 on 03/03/2017 - RBBB, so not good candidate for CRT upgrade.  - Mr Francois is moving towards end stage CHF with cardiorenal syndrome.  He has been evaluated for transplant at [facility], but PSA was found to be elevated and he will be seeing his urologist.  In the mean time, LVAD remains an option if he were to decompensate.  He has undergone workup for LVAD already.  We will need to be careful not to miss potentially narrow window with renal function. No change.  2. Gout: Severe tophaceous gout.  - On Uloric.  Seeing Dr Dierdre Forth now.  - With recent flare is finishing prednisone taper.  3. CKD stage III: May be related to HTN, diabetes, hyperuricemia.  However, cardiorenal syndrome is likely playing a role as well.   - AKI on recent admit. BMET today.  4. CAD: s/p CABG in 2012.   - No chest pain. Continue ASA 81 and atorvastatin.  5. Elevated PSA: PSA rose form 0.3 in 1/18 to 30 in 4/18.  No history of prostate cancer.  ?Prostatitis but not symptomatic.  He has an appointment with a urologist in [place].  - PSA drawn in hospital and repeat by urologist in [facility].  ? Erroneous lab vs lab error with his  elevated value.   Keep 3 week follow up. With soft pressures, gout flare, and AKI, will leave off Entresto for now.   Graciella Freer, PA-C  03/06/17   Greater than 50% of the 25 minute visit was spent in counseling/coordination of care regarding disease state education, sliding scale diuretics, medication reconciliation, and discussion of plan with MD.

## 2017-03-10 ENCOUNTER — Telehealth (HOSPITAL_COMMUNITY): Payer: Self-pay | Admitting: *Deleted

## 2017-03-10 NOTE — Telephone Encounter (Signed)
Pt left VM requesting return call. Called patient back no answer. Left VM for pt to call back .

## 2017-03-18 ENCOUNTER — Other Ambulatory Visit: Payer: Self-pay | Admitting: Cardiology

## 2017-03-24 ENCOUNTER — Ambulatory Visit (HOSPITAL_COMMUNITY)
Admission: RE | Admit: 2017-03-24 | Discharge: 2017-03-24 | Disposition: A | Discharge status: Home / Self Care | Payer: Medicare PPO | Source: Ambulatory Visit | Attending: Cardiology | Admitting: Cardiology

## 2017-03-24 VITALS — BP 102/67 | HR 63 | Wt 204.0 lb

## 2017-03-24 DIAGNOSIS — Z951 Presence of aortocoronary bypass graft: Secondary | ICD-10-CM | POA: Insufficient documentation

## 2017-03-24 DIAGNOSIS — Z8249 Family history of ischemic heart disease and other diseases of the circulatory system: Secondary | ICD-10-CM | POA: Diagnosis not present

## 2017-03-24 DIAGNOSIS — I130 Hypertensive heart and chronic kidney disease with heart failure and stage 1 through stage 4 chronic kidney disease, or unspecified chronic kidney disease: Secondary | ICD-10-CM | POA: Insufficient documentation

## 2017-03-24 DIAGNOSIS — N183 Chronic kidney disease, stage 3 unspecified: Secondary | ICD-10-CM

## 2017-03-24 DIAGNOSIS — I255 Ischemic cardiomyopathy: Secondary | ICD-10-CM | POA: Diagnosis not present

## 2017-03-24 DIAGNOSIS — Z823 Family history of stroke: Secondary | ICD-10-CM | POA: Insufficient documentation

## 2017-03-24 DIAGNOSIS — M1A9XX1 Chronic gout, unspecified, with tophus (tophi): Secondary | ICD-10-CM | POA: Diagnosis not present

## 2017-03-24 DIAGNOSIS — I5022 Chronic systolic (congestive) heart failure: Secondary | ICD-10-CM | POA: Diagnosis not present

## 2017-03-24 DIAGNOSIS — I2510 Atherosclerotic heart disease of native coronary artery without angina pectoris: Secondary | ICD-10-CM | POA: Diagnosis not present

## 2017-03-24 DIAGNOSIS — Z79899 Other long term (current) drug therapy: Secondary | ICD-10-CM | POA: Insufficient documentation

## 2017-03-24 DIAGNOSIS — Z7984 Long term (current) use of oral hypoglycemic drugs: Secondary | ICD-10-CM | POA: Insufficient documentation

## 2017-03-24 DIAGNOSIS — Z95810 Presence of automatic (implantable) cardiac defibrillator: Secondary | ICD-10-CM | POA: Diagnosis not present

## 2017-03-24 DIAGNOSIS — Z7982 Long term (current) use of aspirin: Secondary | ICD-10-CM | POA: Insufficient documentation

## 2017-03-24 DIAGNOSIS — E785 Hyperlipidemia, unspecified: Secondary | ICD-10-CM | POA: Diagnosis not present

## 2017-03-24 DIAGNOSIS — I4510 Unspecified right bundle-branch block: Secondary | ICD-10-CM | POA: Insufficient documentation

## 2017-03-24 DIAGNOSIS — E1122 Type 2 diabetes mellitus with diabetic chronic kidney disease: Secondary | ICD-10-CM | POA: Insufficient documentation

## 2017-03-24 LAB — BRAIN NATRIURETIC PEPTIDE: B NATRIURETIC PEPTIDE 5: 1147.2 pg/mL — AB (ref 0.0–100.0)

## 2017-03-24 LAB — BASIC METABOLIC PANEL
ANION GAP: 11 (ref 5–15)
BUN: 43 mg/dL — ABNORMAL HIGH (ref 6–20)
CALCIUM: 8.7 mg/dL — AB (ref 8.9–10.3)
CO2: 22 mmol/L (ref 22–32)
Chloride: 105 mmol/L (ref 101–111)
Creatinine, Ser: 1.54 mg/dL — ABNORMAL HIGH (ref 0.61–1.24)
GFR calc non Af Amer: 50 mL/min — ABNORMAL LOW (ref >60)
GFR, EST AFRICAN AMERICAN: 58 mL/min — AB (ref >60)
GLUCOSE: 82 mg/dL (ref 65–99)
POTASSIUM: 3.6 mmol/L (ref 3.5–5.1)
SODIUM: 138 mmol/L (ref 135–145)

## 2017-03-24 LAB — DIGOXIN LEVEL: Digoxin Level: 0.5 ng/mL — ABNORMAL LOW (ref 0.8–2.0)

## 2017-03-24 MED ORDER — ISOSORB DINITRATE-HYDRALAZINE 20-37.5 MG PO TABS: 1.5000 | ORAL_TABLET | Freq: Three times a day (TID) | ORAL | 0 refills | Status: AC

## 2017-03-24 MED ORDER — TORSEMIDE 20 MG PO TABS: ORAL_TABLET | ORAL | 3 refills | Status: DC

## 2017-03-24 NOTE — Patient Instructions (Signed)
Increase Torsemide to 20 mg daily every other day ALTERNATING with 30 mg (1.5 tabs) daily every other day   Labs today  You [place], they will contact you with an appointment  Your physician recommends that you schedule a follow-up appointment in: 2 weeks

## 2017-03-25 NOTE — Progress Notes (Signed)
Patient ID: James [name], male   DOB: [birth year], 56 y.o.   MRN: [id number]     Advanced Heart Failure Clinic Note   PCP: Dr. Michel Santee Rheumatology:  Dr. Dierdre Forth HF Cardiology: Dr. Shirlee Latch  56 yo with history of gout, DM2, HTN, CKD, CAD s/p CABG, and chronic systolic CHF presents for CHF clinic evaluation.  He had CABG x 6 in 2/12.  He has a long-standing ischemic cardiomyopathy, echo in 10/16 with EF 20-25%.  He also has severe tophaceous gout.     Now s/p St Jude ICD 10/06/15.  9/17 echo with EF 15%, mild to moderately decreased RV systolic function.  CPX in 12/17 showed severe HF limitation.  RHC in 12/17 showed mildly elevated filling pressures with preserved cardiac output.   Patient was seen at [facility] 4/18 for transplant evaluation.  RHC showed preserved CI at 3.2.  He had a jump in his PSA that corrected and was likely either error or transient prostatitis.  He had severe gout flare while in the hospital at [facility].  He ended up not being listed for transplant as there was concern about mobility limitation with gout and high risk of gout flaring with post-transplant meds.   Weight is down 7 lbs today.  For the last few days, he has been taking torsemide 30 mg alternating with 20 mg daily. Symptomatically stable.  Able to walk 7 laps at the [facility] slowly, dyspneic if he walks fast.  Dyspnea with hill or stairs.  No orthopnea/PND.  No chest pain.  No lightheadedness.  No palptitations.   ECG: NSR, RBBB, 1st degree AVB  Labs (8/16): HCT 38.9, K 4.9, creatinine 1.81 Labs (08/01/2015) : K 3.7 Creatinine 1.49, BNP 658, LDL 85, HDL 65 Labs (09/18/2015): K 5.1 Creatinine 1.5 Labs (02/23/2016) K 4.6, Creatinine 1.58 Labs (03/01/2016): K 3.8, Creatinine 1.53 Labs (7/17): K 4.3, creatinine 1.53 Labs (9/17): K 4, creatinine 1.75, hgb 10.9 Labs (10/17): K 4.6, creatinine 1.6, digoxin 0.9 Labs (1/18): hgb 9.7, creatinine 1.78, BUN 63, K 5.6, albumin 3.4, AST/ALT normal, PSA 0.3 Labs (2/18): K 5, creatinine  1.93 Labs (3/18): K 4.8, creatinine 1.67, digoxin 0.6 Labs (4/18): K 4.2, creatinine 1.8, PSA 30 Labs (5/18): K 4.7, BUN 105, creatinine 1.79, digoxin 0.5  PMH: 1. Tophaceous gout: Sees Dr Dierdre Forth.  2. Hyperlipidemia 3. CKD 4. Type II diabetes 5. CAD: s/p CABG 2/12 with sequential LIMA-mid and distal LAD, sequential SVG to OM1/2/3, SVG-dRCA.   6. Chronic systolic CHF: Ischemic cardiomyopathy. Echo 2013 with EF 25%.  Echo (10/16) with EF 15-20%, restrictive diastolic function, moderately dilated RV with flattened interventricular septum, mild MR.   - CPX (10/16) with peak VO2 11.5, VE/VCO2 slope 33.6, RER 1.19 => moderate to severe HF limitation.  - Echo (9/17): EF 15% with mild LV dilation, mild RV dilation with mild to moderately decreased systolic function, moderate MR, PASP 69 mmHg, IVC dilated.  - St Jude ICD.  - Echo (9/17) with EF 15%, mild LV dilation, diffuse hypokinesis, mildly dilated RV with mild to moderately decreased systolic function, PASP 69 mmHg.  - RHC (9/17) with mean RA 14, PA 70/35 mean 49, mean PCWP 24, RA/PCWP 0.5, CI 2.14 Fick/2.17 thermo, PVR 5.  - CPX (12/17) with peak VO2 8.9 (26% predicted), VE/VCO2 slope 51, RER 1.12 => severe HF limitation.  - RHC (12/17): mean RA 7, PA 62/22 mean 38, mean PCWP 18, CI 3.19 Fick/PVR 2.9 WU, CI 3.56 thermo/PVR 2.65 WU.  - RHC (4/18): mean  RA 8, mean PCWP 18, PVR 3 WU, CI 3.2  SH: Lives in [place], nonsmoker, truck Merchant navy officerdriving instructor.    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  Father with CVA.   ROS: All systems reviewed and negative except as per HPI.   Current Outpatient Prescriptions  Medication Sig Dispense Refill  . acetaminophen (TYLENOL) 500 MG tablet Take 500-1,000 mg by mouth every 4 (four) hours as needed for headache (pain).    Marland Kitchen. aspirin EC 81 MG tablet Take 81 mg by mouth daily.    Marland Kitchen. atorvastatin (LIPITOR) 80 MG tablet TAKE ONE TABLET BY MOUTH DAILY 90 tablet 3  . carvedilol (COREG) 25 MG tablet Take 1 tablet (25 mg total) by mouth 2  (two) times daily with a meal. Please schedule appointment for refills. 180 tablet 3  . digoxin (LANOXIN) 0.125 MG tablet Take 0.5 tablets (0.0625 mg total) by mouth daily. 45 tablet 3  . febuxostat (ULORIC) 40 MG tablet Take 120 mg by mouth daily.    Marland Kitchen. glipiZIDE (GLUCOTROL) 5 MG tablet TAKE 1 TABLET (5 MG TOTAL) BY MOUTH 2 (TWO) TIMES DAILY BEFORE MEALS (REFER FUTURE REFILLS TO PRIMARY CARE PHYSICIAN) 180 tablet 0  . isosorbide-hydrALAZINE (BIDIL) 20-37.5 MG tablet Take 1.5 tablets by mouth 3 (three) times daily. 90 tablet 0  . torsemide (DEMADEX) 20 MG tablet Take 1 tab daily every other day ALTERNATING with 1.5 tabs daily every other day d 75 tablet 3  . nitroGLYCERIN (NITROSTAT) 0.4 MG SL tablet Place 1 tablet (0.4 mg total) under the tongue every 5 (five) minutes as needed for chest pain. (Patient not taking: Reported on 03/24/2017) 25 tablet 2   No current facility-administered medications for this encounter.    BP 102/67   Pulse 63   Wt 204 lb (92.5 kg)   SpO2 100%   BMI 27.67 kg/m    Wt Readings from Last 3 Encounters:  03/24/17 204 lb (92.5 kg)  03/06/17 211 lb (95.7 kg)  03/03/17 208 lb 6.4 oz (94.5 kg)    General: NAD.  HEENT: normal Neck: supple. JVP 8 cm. Carotids 2+ bilat; no bruits. No thyromegaly or nodule noted. Cor: PMI nondisplaced. RRR, no S3/S4, 1/6 HSM apex/LLSB Lungs: CTAB, normal effort. Abdomen: soft, non-tender, distended, no HSM. No bruits or masses. +BS  Extremities: no cyanosis, clubbing, rash, R and LLE no edema. Bilateral hands with significant gouty changes and prominent tophi. 2+ edema 3/4 to knee on left, 1+ edema 3/4 to knee on right.   Neuro: alert & orientedx3, cranial nerves grossly intact. moves all 4 extremities w/o difficulty. Affect pleasant   Assessment/Plan: 1. Chronic systolic CHF: Ischemic cardiomyopathy.  EF 25% in 2013, EF 20-25% on 10/16 echo with evidence for RV volume overload.  Most recent echo was done in 9/17, showing EF 15%,  mildly dilated RV with mild to moderately decreased RV systolic function.  Severe functional deficit by CPX in 12/17, worse than prior study.  He has a Secondary school teachert Jude ICD.  [facility] 12/17 [facility] showed only mildly elevated filling pressures and preserved cardiac output.  Mild volume overload on exam with stable NYHA class III symptoms.  - Increase torsemide to 30 daily alternating with 20 mg daily.  BMET/BNP today and again in 10 days.  - No Entresto with recent AKI and no spironolactone with elevated K.  - Continue current Coreg 25 mg twice a day .  - Continue Bidil 1.5 tabs tid, no BP room to titrate up.    -  Continue digoxin 0.0625 daily, check level.  - RBBB, so not good candidate for CRT upgrade.  - Wear graded compression stockings.  - Mr Mcshan is moving towards end stage CHF with cardiorenal syndrome.  [facility] decided against listing him for transplant.  LVAD remains an option if he were to decompensate.  He has undergone part of the workup for LVAD already.  We will need to be careful not to miss potentially narrow window with renal function.  He would like to speak with current patients with LVADs, I will try to arrange this.   2. Gout: Severe tophaceous gout.  - On Uloric.  Seeing Dr Dierdre Forth now.  3. CKD stage III: May be related to HTN, diabetes, hyperuricemia.  However, cardiorenal syndrome is likely playing a role as well.   - BMET today.   4. CAD: s/p CABG in 2012.  No chest pain. Continue ASA 81 and atorvastatin.   Followup in 2 wks.   Marca Ancona, MD  03/25/17

## 2017-03-26 ENCOUNTER — Telehealth (HOSPITAL_COMMUNITY): Payer: Self-pay | Admitting: *Deleted

## 2017-03-26 NOTE — Telephone Encounter (Signed)
Pt called VAD clinic stating he met and talked with Tanda Rockers, one of our VAD coordinators earlier this week. He had a few additional questions:  1.  Are there different types of pumps? Yes, the surgeon would be the one determining which pump would be right                  for you.  2.  I had a lot of tests done at [facility] and do not want to repeat any unnecessary testing, please reach out to [facility]                  and get any test results that I may need.  3.  I spoke with VAD patient, Cecilie Lowers, and had most of my questions answered.              4.  I will be returning in two weeks to clinic. We will review what tests you have had done at that time

## 2017-04-01 ENCOUNTER — Telehealth: Payer: Self-pay | Admitting: Licensed Clinical Social Worker

## 2017-04-01 NOTE — Telephone Encounter (Signed)
CSW contacted patient to schedule LVAD assessment. Patient has appointment in the clinic next week and will meet with CSW prior to appointment to complete assessment. Lasandra BeechJackie Niyanna Asch, LCSW, CCSW-MCS [phone]

## 2017-04-02 ENCOUNTER — Telehealth: Payer: Self-pay | Admitting: *Deleted

## 2017-04-02 ENCOUNTER — Other Ambulatory Visit: Payer: Self-pay | Admitting: Cardiology

## 2017-04-02 NOTE — Telephone Encounter (Signed)
Called [facility] to request CT chest films for Dr. Maren BeachVanTrigt. Hard copy of uninfused CT chest and abdominal US faxed to our office.  Will get CD copy and mail to our office.

## 2017-04-08 ENCOUNTER — Ambulatory Visit (HOSPITAL_BASED_OUTPATIENT_CLINIC_OR_DEPARTMENT_OTHER)
Admission: RE | Admit: 2017-04-08 | Discharge: 2017-04-08 | Disposition: A | Discharge status: Home / Self Care | Payer: Medicare PPO | Source: Ambulatory Visit | Attending: Cardiology | Admitting: Cardiology

## 2017-04-08 ENCOUNTER — Encounter (HOSPITAL_COMMUNITY): Payer: Self-pay | Admitting: Cardiology

## 2017-04-08 ENCOUNTER — Other Ambulatory Visit (HOSPITAL_COMMUNITY): Payer: Self-pay | Admitting: Cardiology

## 2017-04-08 ENCOUNTER — Telehealth (HOSPITAL_COMMUNITY): Payer: Self-pay | Admitting: *Deleted

## 2017-04-08 ENCOUNTER — Encounter: Payer: Self-pay | Admitting: Licensed Clinical Social Worker

## 2017-04-08 ENCOUNTER — Ambulatory Visit (HOSPITAL_BASED_OUTPATIENT_CLINIC_OR_DEPARTMENT_OTHER)
Admission: RE | Admit: 2017-04-08 | Discharge: 2017-04-08 | Disposition: A | Discharge status: Home / Self Care | Payer: Medicare PPO | Source: Ambulatory Visit | Attending: Internal Medicine | Admitting: Internal Medicine

## 2017-04-08 ENCOUNTER — Other Ambulatory Visit (HOSPITAL_COMMUNITY): Payer: Self-pay | Admitting: *Deleted

## 2017-04-08 ENCOUNTER — Telehealth (HOSPITAL_COMMUNITY): Payer: Self-pay | Admitting: Unknown Physician Specialty

## 2017-04-08 VITALS — BP 102/60 | HR 65 | Wt 215.6 lb

## 2017-04-08 DIAGNOSIS — Z7982 Long term (current) use of aspirin: Secondary | ICD-10-CM | POA: Insufficient documentation

## 2017-04-08 DIAGNOSIS — Z79899 Other long term (current) drug therapy: Secondary | ICD-10-CM

## 2017-04-08 DIAGNOSIS — Z951 Presence of aortocoronary bypass graft: Secondary | ICD-10-CM | POA: Insufficient documentation

## 2017-04-08 DIAGNOSIS — Z7984 Long term (current) use of oral hypoglycemic drugs: Secondary | ICD-10-CM

## 2017-04-08 DIAGNOSIS — E785 Hyperlipidemia, unspecified: Secondary | ICD-10-CM

## 2017-04-08 DIAGNOSIS — I130 Hypertensive heart and chronic kidney disease with heart failure and stage 1 through stage 4 chronic kidney disease, or unspecified chronic kidney disease: Secondary | ICD-10-CM | POA: Insufficient documentation

## 2017-04-08 DIAGNOSIS — N183 Chronic kidney disease, stage 3 unspecified: Secondary | ICD-10-CM

## 2017-04-08 DIAGNOSIS — I2510 Atherosclerotic heart disease of native coronary artery without angina pectoris: Secondary | ICD-10-CM | POA: Insufficient documentation

## 2017-04-08 DIAGNOSIS — I255 Ischemic cardiomyopathy: Secondary | ICD-10-CM | POA: Insufficient documentation

## 2017-04-08 DIAGNOSIS — I5022 Chronic systolic (congestive) heart failure: Secondary | ICD-10-CM | POA: Insufficient documentation

## 2017-04-08 DIAGNOSIS — M109 Gout, unspecified: Secondary | ICD-10-CM | POA: Insufficient documentation

## 2017-04-08 DIAGNOSIS — I509 Heart failure, unspecified: Secondary | ICD-10-CM

## 2017-04-08 DIAGNOSIS — E1122 Type 2 diabetes mellitus with diabetic chronic kidney disease: Secondary | ICD-10-CM

## 2017-04-08 LAB — BASIC METABOLIC PANEL
Anion gap: 8 (ref 5–15)
BUN: 37 mg/dL — ABNORMAL HIGH (ref 6–20)
CALCIUM: 8.1 mg/dL — AB (ref 8.9–10.3)
CO2: 22 mmol/L (ref 22–32)
Chloride: 104 mmol/L (ref 101–111)
Creatinine, Ser: 2.03 mg/dL — ABNORMAL HIGH (ref 0.61–1.24)
GFR calc Af Amer: 41 mL/min — ABNORMAL LOW (ref >60)
GFR, EST NON AFRICAN AMERICAN: 35 mL/min — AB (ref >60)
GLUCOSE: 159 mg/dL — AB (ref 65–99)
Potassium: 3.5 mmol/L (ref 3.5–5.1)
Sodium: 134 mmol/L — ABNORMAL LOW (ref 135–145)

## 2017-04-08 LAB — ECHOCARDIOGRAM COMPLETE: Weight: 3449.6 oz

## 2017-04-08 LAB — BRAIN NATRIURETIC PEPTIDE: B Natriuretic Peptide: 2471.2 pg/mL — ABNORMAL HIGH (ref 0.0–100.0)

## 2017-04-08 MED ORDER — CARVEDILOL 25 MG PO TABS: 12.5000 mg | ORAL_TABLET | Freq: Two times a day (BID) | ORAL | 3 refills | Status: AC

## 2017-04-08 MED ORDER — TORSEMIDE 20 MG PO TABS: 40.0000 mg | ORAL_TABLET | Freq: Every day | ORAL | 3 refills | Status: AC

## 2017-04-08 MED ORDER — COLCHICINE 0.6 MG PO TABS: 0.6000 mg | ORAL_TABLET | Freq: Every day | ORAL | 6 refills | Status: AC

## 2017-04-08 NOTE — Patient Instructions (Addendum)
INCREASE Torsemide 40 mg (2 tabs) daily START Colchiceine 0.6 mg, one tab daily . Decrease Coreg to 12.5 mg twice a day.   Labs today We will only contact you if something comes back abnormal or we need to make some changes. Otherwise no news is good news!  Your physician has requested that you have a cardiac catheterization. Cardiac catheterization is used to diagnose and/or treat various heart conditions. Doctors may recommend this procedure for a number of different reasons. The most common reason is to evaluate chest pain. Chest pain can be a symptom of coronary artery disease (CAD), and cardiac catheterization can show whether plaque is narrowing or blocking your heart's arteries. This procedure is also used to evaluate the valves, as well as measure the blood flow and oxygen levels in different parts of your heart. For further information please visit [email or web address] Please follow instruction sheet, as given.  Your physician has requested that you have an echocardiogram. Echocardiography is a painless test that uses sound waves to create images of your heart. It provides your doctor with information about the size and shape of your heart and how well your heart's chambers and valves are working. This procedure takes approximately one hour. There are no restrictions for this procedure.

## 2017-04-08 NOTE — Progress Notes (Signed)
Patient ID: James [name], male   DOB: 02/14/1961, 56 y.o.   MRN: [id number]     Advanced Heart Failure Clinic Note   PCP: Dr. Favero Rheumatology:  Dr. Beekman HF Cardiology: Dr. Othon Guardia  56 yo with history of gout, DM2, HTN, CKD, CAD s/p CABG, and chronic systolic CHF presents for CHF clinic evaluation.  He had CABG x 6 in 2/12.  He has a long-standing ischemic cardiomyopathy, echo in 10/16 with EF 20-25%.  He also has severe tophaceous gout.     Now s/p St Jude ICD 10/06/15.  9/17 echo with EF 15%, mild to moderately decreased RV systolic function.  CPX in 12/17 showed severe HF limitation.  RHC in 12/17 showed mildly elevated filling pressures with preserved cardiac output.   Patient was seen at [facility] 4/18 for transplant evaluation.  RHC showed preserved CI at 3.2.  He had a jump in his PSA that corrected and was likely either error or transient prostatitis.  He had severe gout flare while in the hospital at [facility].  He ended up not being listed for transplant as there was concern about mobility limitation with gout and high risk of gout flaring with post-transplant meds.   Echo was done today.  EF 15% with mildly dilated LV, mild RV dilation with moderately decreased systolic function, dilated IVC, PASP 68 mmHg, mild to moderate MR.   Today, weight is up 11 lbs.  He feels like he has been in a "gradual decline" over the last 2-3 months.  Fatigues easily, not able to walk as far. Still [facility] to [facility] [employer].  He was tired walking in the office today and mildly short of breath.  No lightheadedness/dizziness.  No palpitations.  He is in NSR.    Corevue: General downward impedance trend over the last couple of months.  No VT.    ECG: NSR, RBBB, 1st degree AVB  Labs (8/16): HCT 38.9, K 4.9, creatinine 1.81 Labs (08/01/2015) : K 3.7 Creatinine 1.49, BNP 658, LDL 85, HDL 65 Labs (09/18/2015): K 5.1 Creatinine 1.5 Labs (02/23/2016) K 4.6, Creatinine 1.58 Labs (03/01/2016): K 3.8, Creatinine  1.53 Labs (7/17): K 4.3, creatinine 1.53 Labs (9/17): K 4, creatinine 1.75, hgb 10.9 Labs (10/17): K 4.6, creatinine 1.6, digoxin 0.9 Labs (1/18): hgb 9.7, creatinine 1.78, BUN 63, K 5.6, albumin 3.4, AST/ALT normal, PSA 0.3 Labs (2/18): K 5, creatinine 1.93 Labs (3/18): K 4.8, creatinine 1.67, digoxin 0.6 Labs (4/18): K 4.2, creatinine 1.8, PSA 30 Labs (5/18): K 4.7, BUN 105, creatinine 1.79, digoxin 0.5 Labs (6/18): digoxin 0.5, K 3.6, creatinine 1.5 => 2.03, BNP 1147 => 2471  PMH: 1. Tophaceous gout: Sees Dr Beekman.  2. Hyperlipidemia 3. CKD 4. Type II diabetes 5. CAD: s/p CABG 2/12 with sequential LIMA-mid and distal LAD, sequential SVG to OM1/2/3, SVG-dRCA.   6. Chronic systolic CHF: Ischemic cardiomyopathy. Echo 2013 with EF 25%.  Echo (10/16) with EF 15-20%, restrictive diastolic function, moderately dilated RV with flattened interventricular septum, mild MR.   - CPX (10/16) with peak VO2 11.5, VE/VCO2 slope 33.6, RER 1.19 => moderate to severe HF limitation.  - Echo (9/17): EF 15% with mild LV dilation, mild RV dilation with mild to moderately decreased systolic function, moderate MR, PASP 69 mmHg, IVC dilated.  - St Jude ICD.  - Echo (9/17) with EF 15%, mild LV dilation, diffuse hypokinesis, mildly dilated RV with mild to moderately decreased systolic function, PASP 69 mmHg.  - RHC (9/17) with mean   RA 14, PA 70/35 mean 49, mean PCWP 24, RA/PCWP 0.5, CI 2.14 Fick/2.17 thermo, PVR 5.  - CPX (12/17) with peak VO2 8.9 (26% predicted), VE/VCO2 slope 51, RER 1.12 => severe HF limitation.  - RHC (12/17): mean RA 7, PA 62/22 mean 38, mean PCWP 18, CI 3.19 Fick/PVR 2.9 WU, CI 3.56 thermo/PVR 2.65 WU.  - RHC (4/18): mean RA 8, mean PCWP 18, PVR 3 WU, CI 3.2 - Echo (6/18): EF 15% with mildly dilated LV, mild RV dilation with moderately decreased systolic function, dilated IVC, PASP 68 mmHg, mild to moderate MR.  SH: Lives in [place], nonsmoker, truck driving instructor.    FH: HTN.   Father with CVA.   ROS: All systems reviewed and negative except as per HPI.   Current Outpatient Prescriptions  Medication Sig Dispense Refill  . acetaminophen (TYLENOL) 500 MG tablet Take 500-1,000 mg by mouth every 4 (four) hours as needed for headache (pain).    . aspirin EC 81 MG tablet Take 81 mg by mouth daily.    . atorvastatin (LIPITOR) 80 MG tablet TAKE ONE TABLET BY MOUTH DAILY 90 tablet 3  . carvedilol (COREG) 25 MG tablet Take 1 tablet (25 mg total) by mouth 2 (two) times daily with a meal. Please schedule appointment for refills. 180 tablet 3  . colchicine 0.6 MG tablet Take 1 tablet (0.6 mg total) by mouth daily. 30 tablet 6  . digoxin (LANOXIN) 0.125 MG tablet Take 0.5 tablets (0.0625 mg total) by mouth daily. 45 tablet 3  . febuxostat (ULORIC) 40 MG tablet Take 120 mg by mouth daily.    . glipiZIDE (GLUCOTROL) 5 MG tablet TAKE 1 TABLET (5 MG TOTAL) BY MOUTH 2 (TWO) TIMES DAILY BEFORE MEALS (REFER FUTURE REFILLS TO PRIMARY CARE PHYSICIAN) 180 tablet 0  . isosorbide-hydrALAZINE (BIDIL) 20-37.5 MG tablet Take 1.5 tablets by mouth 3 (three) times daily. 90 tablet 0  . nitroGLYCERIN (NITROSTAT) 0.4 MG SL tablet Place 1 tablet (0.4 mg total) under the tongue every 5 (five) minutes as needed for chest pain. (Patient not taking: Reported on 03/24/2017) 25 tablet 2  . torsemide (DEMADEX) 20 MG tablet Take 2 tablets (40 mg total) by mouth daily. 60 tablet 3   No current facility-administered medications for this encounter.    BP 102/60 (BP Location: Left Arm, Patient Position: Sitting, Cuff Size: Normal)   Pulse 65   Wt 215 lb 9.6 oz (97.8 kg)   SpO2 100%   BMI 29.24 kg/m    Wt Readings from Last 3 Encounters:  04/08/17 215 lb 9.6 oz (97.8 kg)  03/24/17 204 lb (92.5 kg)  03/06/17 211 lb (95.7 kg)    General: NAD.  HEENT: normal Neck: supple. JVP 12 cm. Carotids 2+ bilat; no bruits. No thyromegaly or nodule noted. Cor: PMI lateral. RRR, +S3, 1/6 HSM apex/LLSB Lungs: CTAB,  normal effort. Abdomen: soft, non-tender, distended, no HSM. No bruits or masses. +BS  Extremities: no cyanosis, clubbing, rash, R and LLE no edema. Bilateral hands with significant gouty changes and prominent tophi. 2+ edema 3/4 to knee on left, 2+ edema 3/4 to knee on right.   Neuro: alert & orientedx3, cranial nerves grossly intact. moves all 4 extremities w/o difficulty. Affect pleasant   Assessment/Plan: 1. Chronic systolic CHF: Ischemic cardiomyopathy.  EF 25% in 2013, EF 20-25% on 10/16 echo with evidence for RV volume overload.  Severe functional deficit by CPX in 12/17, worse than prior study.  He has a St Jude ICD.    [place] in 12/17 and again in 4/18 at [facility] showed only mildly elevated filling pressures and preserved cardiac output.  Most recent echo was done today and reviewed, showing EF 15% with mild LV dilation, mildly dilated RV with moderately decreased RV systolic function.  Recently, he has been doing worse.  Now NYHA class IIIb symptoms.  He is volume overloaded on exam.  Creatinine up to 2 today, suspect cardiorenal.  He was turned down for transplant with severe tophaceous gout and concern that symptoms would worsen post-transplant.  - At this point, I think that he will need an LVAD.  I do not want to miss a potential narrow window, especially as creatinine has gone up.  Hopefully, creatinine rise is a function of worsening volume status and will improve with diuresis.  He has RV dysfunction, but I do not think that his RV appears prohibitive for LVAD.  He has been seen by Dr. Cyndia Bent and has met with current LVAD patients.  Currently undergoing workup with our LVAD team.  I would like him to return on Friday for [place] followed by admission for volume/hemodynamic optimization and likely LVAD placement during this admission. We discussed this plan and he agrees.  - Increase torsemide to 40 mg daily until he returns for RHC, will likely need IV diuresis in the hospital and possibly milrinone.    - No Entresto/ACEI/ARB with AKI and no spironolactone with elevated K.  - Cut back on Coreg to 12.5 mg bid.  - Continue Bidil 1.5 tabs tid, no BP room to titrate up.    - Continue digoxin 0.0625 daily, check level.  - RBBB, so not good candidate for CRT upgrade.  - Wear graded compression stockings.  2. Gout: Severe tophaceous gout.  - On Uloric.  Seeing Dr Amil Amen now.  - As we will be augmenting his diuresis, I am going to have him go ahead and start on daily colchicine again.  3. CKD stage III: May be related to HTN, diabetes, hyperuricemia.  However, cardiorenal syndrome is likely playing a role as well.  Creatinine is higher today, may be related to volume overload.  Hopefully will improve with diuresis +/- milrinone.  4. CAD: s/p CABG in 2012.  No chest pain. Continue ASA 81 and atorvastatin.  Would not do coronary angiography at this point with elevated creatinine.  I think the chance that there is revascularizable disease that will improve his LV function is low.   He will come Friday for [place] and likely admission after that.   Loralie Champagne, MD  04/08/17

## 2017-04-08 NOTE — Telephone Encounter (Signed)
Approval #[id number]   RHC

## 2017-04-08 NOTE — Progress Notes (Signed)
  Echocardiogram 2D Echocardiogram has been performed.  Leta JunglingCooper, Doaa Kendzierski M 04/08/2017, 12:57 PM

## 2017-04-08 NOTE — Progress Notes (Signed)
LVAD Initial Psychosocial Screening  Date/Time Initiated:  04/08/17 10:30am Referral Source:  Carlton Adam, VAD Coordinator Referral Reason:  LVAD implantation Source of Information:  Pt, wife and chart review  Demographics Name:  James [name] Address:  [address] [place] [address] Home phone:   [phone]  Cell: [phone] Marital Status:  Married  Faith:  [employer] Primary Language:  English SS:  [id number]   DOB:  03-07-1961  Medical & Follow-up Adherence to Medical regimen/INR checks:  compliant Medication adherence:  compliant Physician/Clinic Appointment Attendance:  compliant   Advance Directives: Do you have a Living Will or Medical POA?  No Would you like to complete a Living Will and Medical POA prior to surgery? Yes  Do you have Goals of Care? yes  Have you had a consult with the Palliative Care Team at [facility]? No  Psychological Health Appearance:  Well groomed Mental Status:  Alert and oriented Eye Contact:  good Thought Content:  Clear and concise Speech:  clear Mood:  positive Affect:  pleasant Insight: good  Judgement: sound Interaction Style:  Positive and interactive  Family/Social Information Who lives in your home? Name:   Relationship:   James [name]   Wife  [phone] Other family members/support persons in your life? Name:   Relationship:   James [name]  Mother Caregiving Needs Who is the primary caregiver? [name] Health status:  Excellent Do you drive?  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  no Contact number: [phone]  Who is the secondary caregiver? James [name] - mother Health status:  good Do you drive?  yes Do you work?  retired Physical Limitations:  none  Do you have other care giving responsibilities?  none Contact number:  Home Environment/Personal Care Do you have reliable phone service?  yes If so, what is the  number?  [phone] Do you own or rent your home? own Number of steps into the home? 5 steps  How many levels in the home? 3 levels (bedroom upstairs) Assistive devices in the home? crutches Electrical needs for LVAD (3 prong outlets)? yes Second hand smoke exposure in the home? no Travel distance from [facility]? 1hr 15 minutes Self-care: independent Ambulation: independent  Community Are you active with community agencies/resources/homecare? no Are you active in a church, synagogue, mosque or other faith based community? No - too hard to get there recently What other sources do you have for spiritual support? Deacons visit Are you active in any clubs or social organizations?no What do you do for fun?  Hobbies?  Interests? Used to like to hunt and fish and hoping to return after LVAD  Education/Work Information What is the last grade of school you completed? 12th Preferred method of learning?  Hands on Do you have any problems with reading or writing?  no Are you currently employed?  no   When were you last employed? 1 yr ago  Name of employer? 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 30+ years If you are not working, do you plan to return to work after VAD surgery? Hope to If yes, what type of employment do you hope to find? same Are you interested in job training or learning new skills? no Did you serve in the military?  If so, what branch? no  Financial Information What is your  source of income? SSD Do you have difficulty meeting your monthly expenses? no If yes, which ones? N/a Can you budget for the monthly cost for dressing supplies post procedure?  yes Primary Health insurance:  Elton Sin Secondary Insurance: n/a Prescription plan: [employer] What are your prescription co-pays? varies Do you use mail order for your prescriptions?  yes Have you ever had to refuse medication due  to cost?  no Have you applied for Medicaid?  no Have you applied for Social Security Disability (SSI)  receives  Medical Information Briefly describe why you are here for evaluation: Patient reports he noticed swelling in legs, ankles and stomach "just came on" and had bypass surgery in 2012. Decline  Do you have a PCP or other medical provider? James Courts, DO Are you able to complete your ADL's? yes Do you have a history of trauma, physical, emotional, or sexual abuse? none Do you have any family history of heart problems? Father Do you smoke now or past usage?  never Do you drink alcohol now or past usage? Occasional use but none recently Are you currently using illegal drugs or misuse of medication or past usage?  never   Have you ever been treated for substance abuse?  no      If yes, where and when did you receive treatment?  Mental Health History How have you been feeling in the past year? Pretty good although declining over the past few months Have you ever had any problems with depression, anxiety or other mental health issues? nothing Do you see a counselor, psychiatrist or therapist?  no If you are currently experiencing problems are you interested in talking with a professional?  no Have you or are you taking medications for anxiety/depression or any mental health concerns?   no Current Medications: n/a What are your coping strategies under stressful situations? Focus on the good Are there any other stressors in your life?  no Have you had any past or current thoughts of suicide?  no How many hours do you sleep at night?  7-8 hrs How is your appetite?  good Would you be interested in attending the LVAD support group? yes  PHQ2 Depression Scale: 1  Legal Do you currently have any legal issues/problems?   none Have you had any legal issues/problems in the past?  no Do you have a Durable POA?  no  Plan for VAD Implementation Do you know and understand what  happens during the VAD surgery? Patient verbalizes understanding of surgery and able to describe details. What do you know about the risks and side effect associated with VAD surgery? Patient verbalizes and reports infection, stroke and death. Explain what will happen right after surgery:   Patient verbalizes understanding of OR to ICU and will be intubated What is your plan for transportation for the first 8 weeks post-surgery? (Patients are not recommended to drive post-surgery for 8 weeks)  Driver:    Patsy Lager and Kathie Rhodes Do you have airbags in your vehicle? Yes There is a risk of discharging the device if the airbag were to deploy. What do you know about your diet post-surgery?   Heart healthy How do you plan to monitor your medications, current and future?   Take from the bottle How do you plan to complete ADL's post-surgery?  Ask for help if needed Will it be difficult to ask for help from your caregivers?  Not at all  Please explain what you hope will be improved about your  life as a result of receiving the LVAD? Quality of Life Please tell me your biggest concern or fear about living with the LVAD?  "something going wrong" How do you cope with your concerns and fears?   Talk it out Please explain your understanding of how their body will change?  "a little apprehensive- no choice though- I want to live" Do you see any barriers to your surgery or follow-up? None  Understanding of LVAD Patient states understanding of the following: Surgical procedures and risks, Electrical need for LVAD (3 prong outlets), Safety precautions with LVAD (water, etc.), LVAD daily self-care (dressing changes, computer check, extra supplies), Outpatient follow up (LVAD clinic appts, monitoring blood thinners) and Need for Emergency Planning  Discussed and Reviewed with Patient and Caregiver  Patient's current level of motivation to prepare for LVAD: Patient states a little apprehensive but not feeling well and  want improvement Patient's present Level of Consent for LVAD: Yes     Education provided to patient/family/caregiver:   Caregiver role and responsibiltiy, Financial planning for LVAD, Role of Clinical Social Worker and Signs of Depression and Anxiety   Caregiver questions  * Wife not available at time of Assessment- CSW will follow up with wife. Please explain what you hope will be improved about your life and loved one's life as a result of receiving the LVAD?   What is your biggest concern or fear about caregiving with an LVAD patient?   What is your plan for availability to provide care 24/7 x2 weeks post op and dressing changes ongoing?   Who is the relief/backup caregiver and what is their availability?   Preferred method of learning? Hands on  Do you drive?  How do you handle stressful situations?   Do you think you can do this?  Is there anything that concerns about caregiving?   Do you provide caregiving to anyone else?  HADS [facility] Anxiety and Depression Screen score) Caregiver:    Comments:   Caregiver's current level of motivation to prepare for LVAD:  Caregiver's present level of consent for LVAD:   Clinical Interventions Needed:    CSW will monitor for signs and symptoms of depressions and provide supportive intervention as needed. CSW will encourage patient and caregiver to attend the LVAD Support Group. CSW will refer to Pastoral Care for completion of Advanced Directives.  Clinical Impressions/Recommendations:   Mr. Ord is a 56 yo male who has been married for 25 years. Patient is compliant with his medical regimen. Patient has his wife James [name] as his primary caregiver and his mother James [name] as his secondary. Patient also has a 41 yo son who lives about an hour away and is a source of support. Patient reports he was active in his church community although recently unable to attend due to illness. Patient enjoys fishing and hunting and is hopeful to return post LVAD  surgery. 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  He receives SSD and has [employer]. Patient denies any financial issues. He denies any trauma or abuse history. He denies any use of tobacco or substance use and only occasionally used alcohol but states he has not had alcohol in several years. Patient denies any concerns with mental health and states that he tries to focus on the good as a coping mechanism. He scored a 1 on the PHQ-2. He mentioned that he was a little apprehensive about the surgery and  concerns of body image but states realization this will save his life and improve his quality of life. "I have no choice but to go forward". Patient denies any barriers to surgery or follow up. Patient appears to be an excellent candidate for LVAD implantation.  Lasandra BeechJackie Debrina Kizer, LCSW, CCSW-MCS [phone]   Marcy SirenBrennan, Loyce Klasen S, LCSW

## 2017-04-08 NOTE — Telephone Encounter (Signed)
Pt instructed to decrease Coreg to 12.5 mg BID per Dr. Shirlee LatchMclean. Pt verbalized understanding of medication change. This change is being made after Dr. Shirlee LatchMclean reviewed pts echo from today.

## 2017-04-09 ENCOUNTER — Ambulatory Visit (INDEPENDENT_AMBULATORY_CARE_PROVIDER_SITE_OTHER): Payer: Medicare PPO | Admitting: *Deleted

## 2017-04-09 DIAGNOSIS — I255 Ischemic cardiomyopathy: Secondary | ICD-10-CM

## 2017-04-10 NOTE — Progress Notes (Signed)
Remote ICD transmission.   

## 2017-04-11 ENCOUNTER — Encounter (HOSPITAL_COMMUNITY): Admission: AD | Disposition: E | Payer: Self-pay | Source: Ambulatory Visit | Attending: Surgery

## 2017-04-11 ENCOUNTER — Inpatient Hospital Stay (HOSPITAL_COMMUNITY)
Admission: AD | Admit: 2017-04-11 | Discharge: 2017-07-21 | DRG: 001 | Disposition: E | Payer: Medicare PPO | Source: Ambulatory Visit | Attending: Surgery | Admitting: Surgery

## 2017-04-11 ENCOUNTER — Encounter: Payer: Self-pay | Admitting: Cardiology

## 2017-04-11 ENCOUNTER — Encounter (HOSPITAL_COMMUNITY): Payer: Self-pay | Admitting: Cardiology

## 2017-04-11 DIAGNOSIS — K567 Ileus, unspecified: Secondary | ICD-10-CM | POA: Diagnosis not present

## 2017-04-11 DIAGNOSIS — I509 Heart failure, unspecified: Secondary | ICD-10-CM

## 2017-04-11 DIAGNOSIS — R791 Abnormal coagulation profile: Secondary | ICD-10-CM | POA: Diagnosis not present

## 2017-04-11 DIAGNOSIS — K9189 Other postprocedural complications and disorders of digestive system: Secondary | ICD-10-CM | POA: Diagnosis not present

## 2017-04-11 DIAGNOSIS — Z515 Encounter for palliative care: Secondary | ICD-10-CM | POA: Diagnosis not present

## 2017-04-11 DIAGNOSIS — M10012 Idiopathic gout, left shoulder: Secondary | ICD-10-CM | POA: Diagnosis not present

## 2017-04-11 DIAGNOSIS — I9789 Other postprocedural complications and disorders of the circulatory system, not elsewhere classified: Secondary | ICD-10-CM | POA: Diagnosis not present

## 2017-04-11 DIAGNOSIS — R001 Bradycardia, unspecified: Secondary | ICD-10-CM | POA: Diagnosis not present

## 2017-04-11 DIAGNOSIS — E870 Hyperosmolality and hypernatremia: Secondary | ICD-10-CM | POA: Diagnosis not present

## 2017-04-11 DIAGNOSIS — J9621 Acute and chronic respiratory failure with hypoxia: Secondary | ICD-10-CM | POA: Diagnosis not present

## 2017-04-11 DIAGNOSIS — F329 Major depressive disorder, single episode, unspecified: Secondary | ICD-10-CM | POA: Diagnosis not present

## 2017-04-11 DIAGNOSIS — Z22322 Carrier or suspected carrier of Methicillin resistant Staphylococcus aureus: Secondary | ICD-10-CM

## 2017-04-11 DIAGNOSIS — K551 Chronic vascular disorders of intestine: Secondary | ICD-10-CM | POA: Diagnosis present

## 2017-04-11 DIAGNOSIS — R0603 Acute respiratory distress: Secondary | ICD-10-CM | POA: Diagnosis not present

## 2017-04-11 DIAGNOSIS — Z66 Do not resuscitate: Secondary | ICD-10-CM | POA: Diagnosis not present

## 2017-04-11 DIAGNOSIS — R410 Disorientation, unspecified: Secondary | ICD-10-CM | POA: Diagnosis not present

## 2017-04-11 DIAGNOSIS — K921 Melena: Secondary | ICD-10-CM | POA: Diagnosis present

## 2017-04-11 DIAGNOSIS — I252 Old myocardial infarction: Secondary | ICD-10-CM

## 2017-04-11 DIAGNOSIS — Z1211 Encounter for screening for malignant neoplasm of colon: Secondary | ICD-10-CM

## 2017-04-11 DIAGNOSIS — A419 Sepsis, unspecified organism: Secondary | ICD-10-CM | POA: Diagnosis not present

## 2017-04-11 DIAGNOSIS — I130 Hypertensive heart and chronic kidney disease with heart failure and stage 1 through stage 4 chronic kidney disease, or unspecified chronic kidney disease: Secondary | ICD-10-CM | POA: Diagnosis not present

## 2017-04-11 DIAGNOSIS — I639 Cerebral infarction, unspecified: Secondary | ICD-10-CM

## 2017-04-11 DIAGNOSIS — R195 Other fecal abnormalities: Secondary | ICD-10-CM | POA: Diagnosis not present

## 2017-04-11 DIAGNOSIS — E878 Other disorders of electrolyte and fluid balance, not elsewhere classified: Secondary | ICD-10-CM | POA: Diagnosis not present

## 2017-04-11 DIAGNOSIS — E119 Type 2 diabetes mellitus without complications: Secondary | ICD-10-CM

## 2017-04-11 DIAGNOSIS — I97618 Postprocedural hemorrhage and hematoma of a circulatory system organ or structure following other circulatory system procedure: Secondary | ICD-10-CM | POA: Diagnosis not present

## 2017-04-11 DIAGNOSIS — R52 Pain, unspecified: Secondary | ICD-10-CM

## 2017-04-11 DIAGNOSIS — J9600 Acute respiratory failure, unspecified whether with hypoxia or hypercapnia: Secondary | ICD-10-CM

## 2017-04-11 DIAGNOSIS — D696 Thrombocytopenia, unspecified: Secondary | ICD-10-CM | POA: Diagnosis not present

## 2017-04-11 DIAGNOSIS — I25810 Atherosclerosis of coronary artery bypass graft(s) without angina pectoris: Secondary | ICD-10-CM

## 2017-04-11 DIAGNOSIS — G9340 Encephalopathy, unspecified: Secondary | ICD-10-CM | POA: Diagnosis not present

## 2017-04-11 DIAGNOSIS — J9690 Respiratory failure, unspecified, unspecified whether with hypoxia or hypercapnia: Secondary | ICD-10-CM

## 2017-04-11 DIAGNOSIS — N179 Acute kidney failure, unspecified: Secondary | ICD-10-CM | POA: Diagnosis not present

## 2017-04-11 DIAGNOSIS — E875 Hyperkalemia: Secondary | ICD-10-CM | POA: Diagnosis present

## 2017-04-11 DIAGNOSIS — R531 Weakness: Secondary | ICD-10-CM

## 2017-04-11 DIAGNOSIS — D631 Anemia in chronic kidney disease: Secondary | ICD-10-CM | POA: Diagnosis present

## 2017-04-11 DIAGNOSIS — R1110 Vomiting, unspecified: Secondary | ICD-10-CM

## 2017-04-11 DIAGNOSIS — K117 Disturbances of salivary secretion: Secondary | ICD-10-CM

## 2017-04-11 DIAGNOSIS — Z95811 Presence of heart assist device: Secondary | ICD-10-CM

## 2017-04-11 DIAGNOSIS — R040 Epistaxis: Secondary | ICD-10-CM | POA: Diagnosis not present

## 2017-04-11 DIAGNOSIS — Z006 Encounter for examination for normal comparison and control in clinical research program: Secondary | ICD-10-CM

## 2017-04-11 DIAGNOSIS — N390 Urinary tract infection, site not specified: Secondary | ICD-10-CM | POA: Diagnosis not present

## 2017-04-11 DIAGNOSIS — R509 Fever, unspecified: Secondary | ICD-10-CM

## 2017-04-11 DIAGNOSIS — G92 Toxic encephalopathy: Secondary | ICD-10-CM | POA: Diagnosis not present

## 2017-04-11 DIAGNOSIS — J9601 Acute respiratory failure with hypoxia: Secondary | ICD-10-CM | POA: Diagnosis not present

## 2017-04-11 DIAGNOSIS — Z8249 Family history of ischemic heart disease and other diseases of the circulatory system: Secondary | ICD-10-CM

## 2017-04-11 DIAGNOSIS — N189 Chronic kidney disease, unspecified: Secondary | ICD-10-CM | POA: Diagnosis not present

## 2017-04-11 DIAGNOSIS — I471 Supraventricular tachycardia: Secondary | ICD-10-CM | POA: Diagnosis not present

## 2017-04-11 DIAGNOSIS — D649 Anemia, unspecified: Secondary | ICD-10-CM | POA: Diagnosis not present

## 2017-04-11 DIAGNOSIS — N183 Chronic kidney disease, stage 3 (moderate): Secondary | ICD-10-CM | POA: Diagnosis present

## 2017-04-11 DIAGNOSIS — Y838 Other surgical procedures as the cause of abnormal reaction of the patient, or of later complication, without mention of misadventure at the time of the procedure: Secondary | ICD-10-CM | POA: Diagnosis not present

## 2017-04-11 DIAGNOSIS — L89150 Pressure ulcer of sacral region, unstageable: Secondary | ICD-10-CM | POA: Diagnosis not present

## 2017-04-11 DIAGNOSIS — Z87891 Personal history of nicotine dependence: Secondary | ICD-10-CM

## 2017-04-11 DIAGNOSIS — I5020 Unspecified systolic (congestive) heart failure: Secondary | ICD-10-CM | POA: Diagnosis not present

## 2017-04-11 DIAGNOSIS — I2510 Atherosclerotic heart disease of native coronary artery without angina pectoris: Secondary | ICD-10-CM | POA: Diagnosis present

## 2017-04-11 DIAGNOSIS — T45515A Adverse effect of anticoagulants, initial encounter: Secondary | ICD-10-CM | POA: Diagnosis not present

## 2017-04-11 DIAGNOSIS — Z7189 Other specified counseling: Secondary | ICD-10-CM

## 2017-04-11 DIAGNOSIS — Z8739 Personal history of other diseases of the musculoskeletal system and connective tissue: Secondary | ICD-10-CM

## 2017-04-11 DIAGNOSIS — Z978 Presence of other specified devices: Secondary | ICD-10-CM

## 2017-04-11 DIAGNOSIS — E669 Obesity, unspecified: Secondary | ICD-10-CM | POA: Diagnosis present

## 2017-04-11 DIAGNOSIS — E861 Hypovolemia: Secondary | ICD-10-CM | POA: Diagnosis not present

## 2017-04-11 DIAGNOSIS — Z4659 Encounter for fitting and adjustment of other gastrointestinal appliance and device: Secondary | ICD-10-CM

## 2017-04-11 DIAGNOSIS — Z7982 Long term (current) use of aspirin: Secondary | ICD-10-CM

## 2017-04-11 DIAGNOSIS — E1122 Type 2 diabetes mellitus with diabetic chronic kidney disease: Secondary | ICD-10-CM | POA: Diagnosis present

## 2017-04-11 DIAGNOSIS — Z95810 Presence of automatic (implantable) cardiac defibrillator: Secondary | ICD-10-CM

## 2017-04-11 DIAGNOSIS — R64 Cachexia: Secondary | ICD-10-CM | POA: Diagnosis present

## 2017-04-11 DIAGNOSIS — D638 Anemia in other chronic diseases classified elsewhere: Secondary | ICD-10-CM | POA: Diagnosis not present

## 2017-04-11 DIAGNOSIS — B961 Klebsiella pneumoniae [K. pneumoniae] as the cause of diseases classified elsewhere: Secondary | ICD-10-CM | POA: Diagnosis not present

## 2017-04-11 DIAGNOSIS — I959 Hypotension, unspecified: Secondary | ICD-10-CM | POA: Diagnosis not present

## 2017-04-11 DIAGNOSIS — G253 Myoclonus: Secondary | ICD-10-CM | POA: Diagnosis not present

## 2017-04-11 DIAGNOSIS — R627 Adult failure to thrive: Secondary | ICD-10-CM | POA: Diagnosis not present

## 2017-04-11 DIAGNOSIS — E871 Hypo-osmolality and hyponatremia: Secondary | ICD-10-CM | POA: Diagnosis present

## 2017-04-11 DIAGNOSIS — R6521 Severe sepsis with septic shock: Secondary | ICD-10-CM | POA: Diagnosis not present

## 2017-04-11 DIAGNOSIS — J9811 Atelectasis: Secondary | ICD-10-CM | POA: Diagnosis not present

## 2017-04-11 DIAGNOSIS — I272 Pulmonary hypertension, unspecified: Secondary | ICD-10-CM | POA: Diagnosis present

## 2017-04-11 DIAGNOSIS — R278 Other lack of coordination: Secondary | ICD-10-CM | POA: Diagnosis not present

## 2017-04-11 DIAGNOSIS — R5381 Other malaise: Secondary | ICD-10-CM | POA: Diagnosis present

## 2017-04-11 DIAGNOSIS — I4891 Unspecified atrial fibrillation: Secondary | ICD-10-CM

## 2017-04-11 DIAGNOSIS — I255 Ischemic cardiomyopathy: Secondary | ICD-10-CM | POA: Diagnosis present

## 2017-04-11 DIAGNOSIS — L539 Erythematous condition, unspecified: Secondary | ICD-10-CM | POA: Diagnosis not present

## 2017-04-11 DIAGNOSIS — I5023 Acute on chronic systolic (congestive) heart failure: Secondary | ICD-10-CM

## 2017-04-11 DIAGNOSIS — Z6835 Body mass index (BMI) 35.0-35.9, adult: Secondary | ICD-10-CM

## 2017-04-11 DIAGNOSIS — G7281 Critical illness myopathy: Secondary | ICD-10-CM | POA: Diagnosis not present

## 2017-04-11 DIAGNOSIS — I493 Ventricular premature depolarization: Secondary | ICD-10-CM | POA: Diagnosis not present

## 2017-04-11 DIAGNOSIS — R0682 Tachypnea, not elsewhere classified: Secondary | ICD-10-CM

## 2017-04-11 DIAGNOSIS — J189 Pneumonia, unspecified organism: Secondary | ICD-10-CM

## 2017-04-11 DIAGNOSIS — Z888 Allergy status to other drugs, medicaments and biological substances status: Secondary | ICD-10-CM

## 2017-04-11 DIAGNOSIS — R066 Hiccough: Secondary | ICD-10-CM | POA: Diagnosis not present

## 2017-04-11 DIAGNOSIS — Z9889 Other specified postprocedural states: Secondary | ICD-10-CM

## 2017-04-11 DIAGNOSIS — R570 Cardiogenic shock: Secondary | ICD-10-CM | POA: Diagnosis not present

## 2017-04-11 DIAGNOSIS — E43 Unspecified severe protein-calorie malnutrition: Secondary | ICD-10-CM | POA: Diagnosis not present

## 2017-04-11 DIAGNOSIS — J690 Pneumonitis due to inhalation of food and vomit: Secondary | ICD-10-CM | POA: Diagnosis not present

## 2017-04-11 DIAGNOSIS — D62 Acute posthemorrhagic anemia: Secondary | ICD-10-CM | POA: Diagnosis not present

## 2017-04-11 DIAGNOSIS — L8990 Pressure ulcer of unspecified site, unspecified stage: Secondary | ICD-10-CM | POA: Insufficient documentation

## 2017-04-11 DIAGNOSIS — Z833 Family history of diabetes mellitus: Secondary | ICD-10-CM

## 2017-04-11 DIAGNOSIS — G2569 Other tics of organic origin: Secondary | ICD-10-CM

## 2017-04-11 DIAGNOSIS — E039 Hypothyroidism, unspecified: Secondary | ICD-10-CM | POA: Diagnosis present

## 2017-04-11 DIAGNOSIS — R0600 Dyspnea, unspecified: Secondary | ICD-10-CM

## 2017-04-11 DIAGNOSIS — I361 Nonrheumatic tricuspid (valve) insufficiency: Secondary | ICD-10-CM | POA: Diagnosis not present

## 2017-04-11 DIAGNOSIS — D689 Coagulation defect, unspecified: Secondary | ICD-10-CM | POA: Diagnosis not present

## 2017-04-11 DIAGNOSIS — G4700 Insomnia, unspecified: Secondary | ICD-10-CM | POA: Diagnosis not present

## 2017-04-11 DIAGNOSIS — Z01818 Encounter for other preprocedural examination: Secondary | ICD-10-CM

## 2017-04-11 DIAGNOSIS — Z6831 Body mass index (BMI) 31.0-31.9, adult: Secondary | ICD-10-CM

## 2017-04-11 DIAGNOSIS — I5043 Acute on chronic combined systolic (congestive) and diastolic (congestive) heart failure: Secondary | ICD-10-CM | POA: Diagnosis present

## 2017-04-11 DIAGNOSIS — I472 Ventricular tachycardia, unspecified: Secondary | ICD-10-CM

## 2017-04-11 DIAGNOSIS — D500 Iron deficiency anemia secondary to blood loss (chronic): Secondary | ICD-10-CM | POA: Diagnosis present

## 2017-04-11 DIAGNOSIS — K922 Gastrointestinal hemorrhage, unspecified: Secondary | ICD-10-CM | POA: Diagnosis not present

## 2017-04-11 DIAGNOSIS — M1000 Idiopathic gout, unspecified site: Secondary | ICD-10-CM | POA: Diagnosis not present

## 2017-04-11 DIAGNOSIS — K31811 Angiodysplasia of stomach and duodenum with bleeding: Secondary | ICD-10-CM | POA: Diagnosis not present

## 2017-04-11 DIAGNOSIS — L97919 Non-pressure chronic ulcer of unspecified part of right lower leg with unspecified severity: Secondary | ICD-10-CM | POA: Diagnosis present

## 2017-04-11 DIAGNOSIS — K5521 Angiodysplasia of colon with hemorrhage: Secondary | ICD-10-CM | POA: Diagnosis not present

## 2017-04-11 DIAGNOSIS — I340 Nonrheumatic mitral (valve) insufficiency: Secondary | ICD-10-CM | POA: Diagnosis not present

## 2017-04-11 DIAGNOSIS — M1A9XX1 Chronic gout, unspecified, with tophus (tophi): Secondary | ICD-10-CM | POA: Diagnosis present

## 2017-04-11 DIAGNOSIS — Z9289 Personal history of other medical treatment: Secondary | ICD-10-CM

## 2017-04-11 DIAGNOSIS — I480 Paroxysmal atrial fibrillation: Secondary | ICD-10-CM | POA: Diagnosis present

## 2017-04-11 DIAGNOSIS — R710 Precipitous drop in hematocrit: Secondary | ICD-10-CM | POA: Diagnosis not present

## 2017-04-11 DIAGNOSIS — Z7984 Long term (current) use of oral hypoglycemic drugs: Secondary | ICD-10-CM

## 2017-04-11 DIAGNOSIS — Z7901 Long term (current) use of anticoagulants: Secondary | ICD-10-CM | POA: Diagnosis not present

## 2017-04-11 DIAGNOSIS — R488 Other symbolic dysfunctions: Secondary | ICD-10-CM | POA: Diagnosis not present

## 2017-04-11 DIAGNOSIS — Z79899 Other long term (current) drug therapy: Secondary | ICD-10-CM

## 2017-04-11 DIAGNOSIS — Z9689 Presence of other specified functional implants: Secondary | ICD-10-CM

## 2017-04-11 DIAGNOSIS — Z951 Presence of aortocoronary bypass graft: Secondary | ICD-10-CM

## 2017-04-11 HISTORY — DX: Disorder of kidney and ureter, unspecified: N28.9

## 2017-04-11 HISTORY — DX: Heart failure, unspecified: I50.9

## 2017-04-11 HISTORY — PX: RIGHT HEART CATH: CATH118263

## 2017-04-11 HISTORY — DX: Dyspnea, unspecified: R06.00

## 2017-04-11 LAB — URINALYSIS, ROUTINE W REFLEX MICROSCOPIC
Bilirubin Urine: NEGATIVE
Glucose, UA: NEGATIVE mg/dL
Hgb urine dipstick: NEGATIVE
Ketones, ur: NEGATIVE mg/dL
Leukocytes, UA: NEGATIVE
Nitrite: NEGATIVE
Protein, ur: 30 mg/dL — AB
Specific Gravity, Urine: 1.008 (ref 1.005–1.030)
Squamous Epithelial / LPF: NONE SEEN
pH: 5 (ref 5.0–8.0)

## 2017-04-11 LAB — BASIC METABOLIC PANEL
Anion gap: 8 (ref 5–15)
BUN: 35 mg/dL — ABNORMAL HIGH (ref 6–20)
CHLORIDE: 104 mmol/L (ref 101–111)
CO2: 23 mmol/L (ref 22–32)
CREATININE: 1.62 mg/dL — AB (ref 0.61–1.24)
Calcium: 7.6 mg/dL — ABNORMAL LOW (ref 8.9–10.3)
GFR calc non Af Amer: 47 mL/min — ABNORMAL LOW (ref >60)
GFR, EST AFRICAN AMERICAN: 54 mL/min — AB (ref >60)
Glucose, Bld: 146 mg/dL — ABNORMAL HIGH (ref 65–99)
POTASSIUM: 3.6 mmol/L (ref 3.5–5.1)
Sodium: 135 mmol/L (ref 135–145)

## 2017-04-11 LAB — CBC
HCT: 26.4 % — ABNORMAL LOW (ref 39.0–52.0)
Hemoglobin: 8.4 g/dL — ABNORMAL LOW (ref 13.0–17.0)
MCH: 27.3 pg (ref 26.0–34.0)
MCHC: 31.8 g/dL (ref 30.0–36.0)
MCV: 85.7 fL (ref 78.0–100.0)
Platelets: 358 10*3/uL (ref 150–400)
RBC: 3.08 MIL/uL — ABNORMAL LOW (ref 4.22–5.81)
RDW: 20 % — AB (ref 11.5–15.5)
WBC: 6.3 10*3/uL (ref 4.0–10.5)

## 2017-04-11 LAB — POCT I-STAT 3, VENOUS BLOOD GAS (G3P V)
ACID-BASE DEFICIT: 1 mmol/L (ref 0.0–2.0)
BICARBONATE: 23.4 mmol/L (ref 20.0–28.0)
BICARBONATE: 23.6 mmol/L (ref 20.0–28.0)
BICARBONATE: 23.9 mmol/L (ref 20.0–28.0)
O2 SAT: 42 %
O2 SAT: 52 %
O2 SAT: 52 %
PCO2 VEN: 35.2 mmHg — AB (ref 44.0–60.0)
TCO2: 24 mmol/L (ref 0–100)
TCO2: 25 mmol/L (ref 0–100)
TCO2: 25 mmol/L (ref 0–100)
pCO2, Ven: 35.4 mmHg — ABNORMAL LOW (ref 44.0–60.0)
pCO2, Ven: 35.8 mmHg — ABNORMAL LOW (ref 44.0–60.0)
pH, Ven: 7.423 (ref 7.250–7.430)
pH, Ven: 7.434 — ABNORMAL HIGH (ref 7.250–7.430)
pH, Ven: 7.438 — ABNORMAL HIGH (ref 7.250–7.430)
pO2, Ven: 23 mmHg — CL (ref 32.0–45.0)
pO2, Ven: 26 mmHg — CL (ref 32.0–45.0)
pO2, Ven: 27 mmHg — CL (ref 32.0–45.0)

## 2017-04-11 LAB — GLUCOSE, CAPILLARY
GLUCOSE-CAPILLARY: 158 mg/dL — AB (ref 65–99)
Glucose-Capillary: 112 mg/dL — ABNORMAL HIGH (ref 65–99)
Glucose-Capillary: 80 mg/dL (ref 65–99)

## 2017-04-11 LAB — PROTIME-INR
INR: 1.28
PROTHROMBIN TIME: 16.1 s — AB (ref 11.4–15.2)

## 2017-04-11 LAB — SURGICAL PCR SCREEN
MRSA, PCR: POSITIVE — AB
Staphylococcus aureus: POSITIVE — AB

## 2017-04-11 SURGERY — RIGHT HEART CATH
Anesthesia: LOCAL

## 2017-04-11 MED ORDER — ONDANSETRON HCL 4 MG/2ML IJ SOLN: 4.0000 mg | Freq: Four times a day (QID) | INTRAMUSCULAR | Status: DC | PRN

## 2017-04-11 MED ORDER — SODIUM CHLORIDE 0.9% FLUSH
3.0000 mL | Freq: Two times a day (BID) | INTRAVENOUS | Status: DC
  Administered 2017-04-15 – 2017-04-16 (×2): 3 mL via INTRAVENOUS

## 2017-04-11 MED ORDER — SODIUM CHLORIDE 0.9 % IV SOLN: 250.0000 mL | INTRAVENOUS | Status: DC | PRN

## 2017-04-11 MED ORDER — HEPARIN (PORCINE) IN NACL 2-0.9 UNIT/ML-% IJ SOLN
INTRAMUSCULAR | Status: AC
  Filled 2017-04-11: qty 500

## 2017-04-11 MED ORDER — CARVEDILOL 6.25 MG PO TABS
6.2500 mg | ORAL_TABLET | Freq: Two times a day (BID) | ORAL | Status: DC
  Administered 2017-04-11 – 2017-04-16 (×11): 6.25 mg via ORAL
  Filled 2017-04-11 (×11): qty 1

## 2017-04-11 MED ORDER — ASPIRIN EC 81 MG PO TBEC
81.0000 mg | DELAYED_RELEASE_TABLET | Freq: Every day | ORAL | Status: DC
  Administered 2017-04-12 – 2017-04-16 (×5): 81 mg via ORAL
  Filled 2017-04-11 (×5): qty 1

## 2017-04-11 MED ORDER — SODIUM CHLORIDE 0.9% FLUSH
10.0000 mL | Freq: Two times a day (BID) | INTRAVENOUS | Status: DC
  Administered 2017-04-11 – 2017-04-12 (×3): 10 mL
  Administered 2017-04-13: 20 mL
  Administered 2017-04-14: 10 mL
  Administered 2017-04-14: 20 mL
  Administered 2017-04-15: 10 mL
  Administered 2017-04-15: 20 mL
  Administered 2017-04-16 – 2017-04-18 (×4): 10 mL
  Administered 2017-04-19: 20 mL
  Administered 2017-04-20 – 2017-04-22 (×3): 10 mL
  Administered 2017-04-23: 20 mL
  Administered 2017-04-24 – 2017-04-27 (×5): 10 mL

## 2017-04-11 MED ORDER — SODIUM CHLORIDE 0.9% FLUSH: 3.0000 mL | Freq: Two times a day (BID) | INTRAVENOUS | Status: DC

## 2017-04-11 MED ORDER — LIDOCAINE HCL (PF) 1 % IJ SOLN
INTRAMUSCULAR | Status: DC | PRN
  Administered 2017-04-11: 15 mL

## 2017-04-11 MED ORDER — SODIUM CHLORIDE 0.9% FLUSH: 3.0000 mL | INTRAVENOUS | Status: DC | PRN

## 2017-04-11 MED ORDER — FENTANYL CITRATE (PF) 100 MCG/2ML IJ SOLN
INTRAMUSCULAR | Status: AC
  Filled 2017-04-11: qty 2

## 2017-04-11 MED ORDER — FEBUXOSTAT 40 MG PO TABS
120.0000 mg | ORAL_TABLET | Freq: Every day | ORAL | Status: DC
  Administered 2017-04-12 – 2017-04-23 (×11): 120 mg via ORAL
  Filled 2017-04-11 (×13): qty 3

## 2017-04-11 MED ORDER — LIDOCAINE HCL 1 % IJ SOLN
INTRAMUSCULAR | Status: AC
  Filled 2017-04-11: qty 20

## 2017-04-11 MED ORDER — DIGOXIN 125 MCG PO TABS
0.0625 mg | ORAL_TABLET | Freq: Every day | ORAL | Status: DC
  Administered 2017-04-12 – 2017-04-23 (×8): 0.0625 mg via ORAL
  Filled 2017-04-11 (×9): qty 1

## 2017-04-11 MED ORDER — ATORVASTATIN CALCIUM 80 MG PO TABS
80.0000 mg | ORAL_TABLET | Freq: Every day | ORAL | Status: DC
  Administered 2017-04-11 – 2017-04-23 (×11): 80 mg via ORAL
  Filled 2017-04-11 (×11): qty 1

## 2017-04-11 MED ORDER — HEPARIN (PORCINE) IN NACL 2-0.9 UNIT/ML-% IJ SOLN
INTRAMUSCULAR | Status: AC | PRN
  Administered 2017-04-11: 500 mL

## 2017-04-11 MED ORDER — FENTANYL CITRATE (PF) 100 MCG/2ML IJ SOLN
INTRAMUSCULAR | Status: DC | PRN
Start: 2017-04-11 — End: 2017-04-11
  Administered 2017-04-11: 25 ug via INTRAVENOUS

## 2017-04-11 MED ORDER — MIDAZOLAM HCL 2 MG/2ML IJ SOLN
INTRAMUSCULAR | Status: AC
  Filled 2017-04-11: qty 2

## 2017-04-11 MED ORDER — MILRINONE LACTATE IN DEXTROSE 20-5 MG/100ML-% IV SOLN
0.2500 ug/kg/min | INTRAVENOUS | Status: DC
  Administered 2017-04-11 – 2017-04-14 (×6): 0.25 ug/kg/min via INTRAVENOUS
  Filled 2017-04-11 (×6): qty 100

## 2017-04-11 MED ORDER — ACETAMINOPHEN 325 MG PO TABS: 650.0000 mg | ORAL_TABLET | ORAL | Status: DC | PRN

## 2017-04-11 MED ORDER — HEPARIN SODIUM (PORCINE) 5000 UNIT/ML IJ SOLN
5000.0000 [IU] | Freq: Three times a day (TID) | INTRAMUSCULAR | Status: DC
  Administered 2017-04-12 – 2017-04-16 (×13): 5000 [IU] via SUBCUTANEOUS
  Filled 2017-04-11 (×12): qty 1

## 2017-04-11 MED ORDER — FUROSEMIDE 10 MG/ML IJ SOLN
80.0000 mg | Freq: Once | INTRAMUSCULAR | Status: AC
  Administered 2017-04-11: 80 mg via INTRAVENOUS
  Filled 2017-04-11: qty 8

## 2017-04-11 MED ORDER — ASPIRIN 81 MG PO CHEW: 81.0000 mg | CHEWABLE_TABLET | ORAL | Status: AC

## 2017-04-11 MED ORDER — GLIPIZIDE 5 MG PO TABS
5.0000 mg | ORAL_TABLET | Freq: Two times a day (BID) | ORAL | Status: DC
  Administered 2017-04-11 – 2017-04-14 (×7): 5 mg via ORAL
  Filled 2017-04-11 (×8): qty 1

## 2017-04-11 MED ORDER — MIDAZOLAM HCL 2 MG/2ML IJ SOLN
INTRAMUSCULAR | Status: DC | PRN
  Administered 2017-04-11: 1 mg via INTRAVENOUS

## 2017-04-11 MED ORDER — ENOXAPARIN SODIUM 40 MG/0.4ML ~~LOC~~ SOLN: 40.0000 mg | SUBCUTANEOUS | Status: DC

## 2017-04-11 MED ORDER — MUPIROCIN 2 % EX OINT
1.0000 "application " | TOPICAL_OINTMENT | Freq: Two times a day (BID) | CUTANEOUS | Status: AC
  Administered 2017-04-12 – 2017-04-15 (×9): 1 via NASAL
  Filled 2017-04-11 (×3): qty 22

## 2017-04-11 MED ORDER — SODIUM CHLORIDE 0.9 % IV SOLN
INTRAVENOUS | Status: DC
  Administered 2017-04-12: via INTRAVENOUS

## 2017-04-11 MED ORDER — CHLORHEXIDINE GLUCONATE CLOTH 2 % EX PADS
6.0000 | MEDICATED_PAD | Freq: Every day | CUTANEOUS | Status: AC
  Administered 2017-04-12 – 2017-04-16 (×5): 6 via TOPICAL

## 2017-04-11 MED ORDER — FUROSEMIDE 10 MG/ML IJ SOLN
15.0000 mg/h | INTRAVENOUS | Status: DC
  Administered 2017-04-11: 12 mg/h via INTRAVENOUS
  Administered 2017-04-12 – 2017-04-14 (×5): 15 mg/h via INTRAVENOUS
  Filled 2017-04-11: qty 25
  Filled 2017-04-11: qty 21
  Filled 2017-04-11 (×3): qty 25
  Filled 2017-04-11: qty 5
  Filled 2017-04-11 (×4): qty 25

## 2017-04-11 MED ORDER — COLCHICINE 0.6 MG PO TABS
0.6000 mg | ORAL_TABLET | Freq: Every day | ORAL | Status: DC
  Administered 2017-04-11 – 2017-04-21 (×9): 0.6 mg via ORAL
  Filled 2017-04-11 (×10): qty 1

## 2017-04-11 MED ORDER — SODIUM CHLORIDE 0.9% FLUSH: 10.0000 mL | INTRAVENOUS | Status: DC | PRN

## 2017-04-11 SURGICAL SUPPLY — 10 items
CATH SWAN GANZ 7F STRAIGHT (CATHETERS) ×2 IMPLANT
COVER PRB 48X5XTLSCP FOLD TPE (BAG) ×1 IMPLANT
COVER PROBE 5X48 (BAG) ×1
PACK CARDIAC CATHETERIZATION (CUSTOM PROCEDURE TRAY) ×2 IMPLANT
PROTECTION STATION PRESSURIZED (MISCELLANEOUS) ×2
SHEATH PINNACLE 7F 10CM (SHEATH) ×2 IMPLANT
STATION PROTECTION PRESSURIZED (MISCELLANEOUS) ×1 IMPLANT
TRANSDUCER W/STOPCOCK (MISCELLANEOUS) ×4 IMPLANT
TUBING ART PRESS 72  MALE/FEM (TUBING) ×1
TUBING ART PRESS 72 MALE/FEM (TUBING) ×1 IMPLANT

## 2017-04-11 NOTE — Interval H&P Note (Signed)
History and Physical Interval Note:  04/03/2017 1:01 PM  [name]  has presented today for surgery, with the diagnosis of hf  The various methods of treatment have been discussed with the patient and family. After consideration of risks, benefits and other options for treatment, the patient has consented to  Procedure(s): Right Heart Cath (N/A) as a surgical intervention .  The patient's history has been reviewed, patient examined, no change in status, stable for surgery.  I have reviewed the patient's chart and labs.  Questions were answered to the patient's satisfaction.     Andrus Sharp [employer]

## 2017-04-11 NOTE — Progress Notes (Signed)
CSW met at bedside with patient and wife Corky Sox. CSW was unable to complete caregiver portion of LVAD assessment as wife was unavailble at the time. CSW discussed at length with patient and wife about caregiver role and responsibilities. CSW asked caregiver questions from assessment tool: Caregiver questions Please explain what you hope will be improved about your life and loved one's life as a result of receiving the LVAD?  The emotional burden will be lifted and improved Quality of life. What is your biggest concern or fear about caregiving with an LVAD patient?  Juggling work and home life What is your plan for availability to provide care 24/7 x2 weeks post op and dressing changes ongoing?  Wife and patient's mother and wife's parents will assist with joint caregiving. Who is the relief/backup caregiver and what is their availability?  Patient's mother Preferred method of learning? Written Do you drive? Yes How do you handle stressful situations?   Exercise, walk away Do you think you can do this? Yes Is there anything that concerns about caregiving?  Not really Do you provide caregiving to anyone else?  No  Caregiver's current level of motivation to prepare for LVAD: Cautiously optimistic Caregiver's present level of consent for LVAD:  Ready  Patient and wife verbalize understanding and deny any concerns at this time. Patient's wife appears to be ready and understands the role of caregiver. CSW recommends patient for LVAD implant pending MRB review and determination.  Raquel Sarna, [place], [place]

## 2017-04-11 NOTE — Progress Notes (Signed)
Peripherally Inserted Central Catheter/Midline Placement  The IV Nurse has discussed with the patient and/or persons authorized to consent for the patient, the purpose of this procedure and the potential benefits and risks involved with this procedure.  The benefits include less needle sticks, lab draws from the catheter, and the patient may be discharged home with the catheter. Risks include, but not limited to, infection, bleeding, blood clot (thrombus formation), and puncture of an artery; nerve damage and irregular heartbeat and possibility to perform a PICC exchange if needed/ordered by physician.  Alternatives to this procedure were also discussed.  Bard Power PICC patient education guide, fact sheet on infection prevention and patient information card has been provided to patient /or left at bedside.    PICC/Midline Placement Documentation        James [name], James [name] 04/14/2017, 10:49 PM

## 2017-04-11 NOTE — Progress Notes (Signed)
Day of Surgery Procedure(s) (LRB): Right Heart Cath (N/A) Subjective: Patient examined, most recent echocardiogram images personally reviewed, right heart cath data personally reviewed  Currently the patient's poor RV function him would place him at high risk to benefit from implantable VAD. He was just started on milrinone today. He was given a dose of IV Lasix for CVP of 20. He will need follow-up echo and probably repeat right heart cath Tuesday or Wednesday next week prior to potential implantable VAD  Objective: Vital signs in last 24 hours: Temp:  [98 F (36.7 C)-99.5 F (37.5 C)] 98 F (36.7 C) (06/22 1931) Pulse Rate:  [61-123] 72 (06/22 1931) Cardiac Rhythm: Normal sinus rhythm (06/22 1909) Resp:  [6-23] 23 (06/22 1931) BP: (98-124)/(64-83) 98/64 (06/22 1931) SpO2:  [99 %-100 %] 100 % (06/22 1931) FiO2 (%):  [21 %] 21 % (06/22 1415) Weight:  [210 lb (95.3 kg)-222 lb 11.2 oz (101 kg)] 222 lb 11.2 oz (101 kg) (06/22 1409)  Hemodynamic parameters for last 24 hours:  sinus rhythm  Intake/Output from previous day: No intake/output data recorded. Intake/Output this shift: Total I/O In: 120 [P.O.:120] Out: 0   Alert and comfortable Breath sounds clear Moderate edema Extremities warm Neuro intact  Lab Results:  Recent Labs  03/24/2017 1027  WBC 6.3  HGB 8.4*  HCT 26.4*  PLT 358   BMET:  Recent Labs  03/25/2017 1656  NA 135  K 3.6  CL 104  CO2 23  GLUCOSE 146*  BUN 35*  CREATININE 1.62*  CALCIUM 7.6*    PT/INR:  Recent Labs  04/12/2017 1027  LABPROT 16.1*  INR 1.28   ABG    Component Value Date/Time   PHART 7.394 07/15/2016 1153   HCO3 23.4 04/13/2017 1329   TCO2 24 04/19/2017 1329   ACIDBASEDEF 1.0 03/26/2017 1329   O2SAT 52.0 04/19/2017 1329   CBG (last 3)   Recent Labs  03/30/2017 1029 04/05/2017 1619  GLUCAP 112* 158*    Assessment/Plan: S/P Procedure(s) (LRB): Right Heart Cath (N/A) Follow-up CT scan of chest and abdomen Continue  milrinone with reassessment of RV function next week   LOS: 0 days    Kathlee Nationseter Van Trigt III 04/03/2017

## 2017-04-11 NOTE — Progress Notes (Addendum)
Late Entry G-Codes from Evaluation on 03/03/17    03/03/17 1612  PT G-Codes **NOT FOR INPATIENT CLASS**  Functional Assessment Tool Used AM-PAC 6 Clicks Basic Mobility;Clinical judgement  Functional Limitation Mobility: Walking and moving around  Mobility: Walking and Moving Around Current Status [phone](G8978) CK  Mobility: Walking and Moving Around Goal Status (O1308(G8979) CI   James [name] PT, DPT  [phone]

## 2017-04-11 NOTE — H&P (Signed)
Advanced Heart Failure Team History and Physical Note   Primary Physician: Dr Michel Santee  Primary HF Cardiologist:  Dr Shirlee Latch   Reason for Admission: A/C Systolic Heart Failure    HPI:    Mr James [name] is a 56 year old with history of gout, DMII, HTN, CKD, CAD S/P CABGx6 2012, chronic systolic heart failure and St jude ICD. Evaluated April of the year for transplant but was not felt to be candidate due to mobility limitations with gout.   He was seen in the HF clinic earlier this week was doing ok at that time but creatinine was trending up so he was set up for RHC and optimization for possible VAD. RHC with volume overload , preserved cardiac output, and pulmonary htn mixed venous /arterial.   Denies SOB. Complaining of fatigue.   RHC 04/16/2017  RA mean 20 RV 71/22 PA 70/31 mean 45 PCWP mean 24 CVP/PCWP 0.83 PAPi 1.95 Oxygen saturations: PA 49% AO 100% Cardiac Output (Fick) 4.95  Cardiac Index (Fick) 2.28 PVR 4.24 WU Cardiac Output (Thermo) 5.74 Cardiac Index (Fick) 2.65 PVR 3.65 WU  [facility] 04/08/2017 EF 15%  Review of Systems: [y] = yes, [ ]  = no   General: Weight gain [ ] ; Weight loss [ ] ; Anorexia [ ] ; Fatigue [Y ]; Fever [ ] ; Chills [ ] ; Weakness [ ]   Cardiac: Chest pain/pressure [ ] ; Resting SOB [ ] ; Exertional SOB [Y ]; Orthopnea [ ] ; Pedal Edema [Y ]; Palpitations [ ] ; Syncope [ ] ; Presyncope [ ] ; Paroxysmal nocturnal dyspnea[ ]   Pulmonary: Cough [ ] ; Wheezing[ ] ; Hemoptysis[ ] ; Sputum [ ] ; Snoring [ ]   GI: Vomiting[ ] ; Dysphagia[ ] ; Melena[ ] ; Hematochezia [ ] ; Heartburn[ ] ; Abdominal pain [ ] ; Constipation [ ] ; Diarrhea [ ] ; BRBPR [ ]   GU: Hematuria[ ] ; Dysuria [ ] ; Nocturia[ ]   Vascular: Pain in legs with walking [ ] ; Pain in feet with lying flat [ ] ; Non-healing sores [ ] ; Stroke [ ] ; TIA [ ] ; Slurred speech [ ] ;  Neuro: Headaches[ ] ; Vertigo[ ] ; Seizures[ ] ; Paresthesias[ ] ;Blurred vision [ ] ; Diplopia [ ] ; Vision changes [ ]   Ortho/Skin: Arthritis [ ] ; Joint pain  [Y ]; Muscle pain [ ] ; Joint swelling [ ] ; Back Pain [ ] ; Rash [ ]   Psych: Depression[ ] ; Anxiety[ ]   Heme: Bleeding problems [ ] ; Clotting disorders [ ] ; Anemia [ ]   Endocrine: Diabetes [ ] ; Thyroid dysfunction[ ]   Home Medications Prior to Admission medications   Medication Sig Start Date End Date Taking? Authorizing Provider  acetaminophen (TYLENOL) 500 MG tablet Take 500-1,000 mg by mouth every 4 (four) hours as needed for headache (pain).   Yes [provider]  aspirin EC 81 MG tablet Take 81 mg by mouth daily.   Yes [provider]  atorvastatin (LIPITOR) 80 MG tablet TAKE ONE TABLET BY MOUTH DAILY 09/20/16  Yes Laurey Morale, MD  carvedilol (COREG) 25 MG tablet Take 0.5 tablets (12.5 mg total) by mouth 2 (two) times daily with a meal. Please schedule appointment for refills. 04/08/17  Yes Laurey Morale, MD  Colchicine (MITIGARE) 0.6 MG CAPS Take 1 capsule by mouth 2 (two) times daily.   Yes [provider]  colchicine 0.6 MG tablet Take 1 tablet (0.6 mg total) by mouth daily. Patient taking differently: Take 0.6 mg by mouth daily as needed (gout).  04/08/17  Yes Laurey Morale, MD  digoxin (LANOXIN) 0.125 MG tablet Take 0.5 tablets (0.0625  mg total) by mouth daily. 02/20/17  Yes Laurey MoraleMcLean, Reinhold Rickey S, MD  febuxostat (ULORIC) 40 MG tablet Take 120 mg by mouth daily.   Yes [provider]  glipiZIDE (GLUCOTROL) 5 MG tablet TAKE 1 TABLET (5 MG TOTAL) BY MOUTH 2 (TWO) TIMES DAILY BEFORE MEALS (REFER FUTURE REFILLS TO PRIMARY CARE PHYSICIAN) 04/24/16  Yes Hochrein, Fayrene FearingJames, MD  isosorbide-hydrALAZINE (BIDIL) 20-37.5 MG tablet Take 1.5 tablets by mouth 3 (three) times daily. 03/24/17  Yes Laurey MoraleMcLean, Bayler Nehring S, MD  nitroGLYCERIN (NITROSTAT) 0.4 MG SL tablet Place 1 tablet (0.4 mg total) under the tongue every 5 (five) minutes as needed for chest pain. 02/18/17 05/07/20 Yes Laurey MoraleMcLean, Eldrick Penick S, MD  torsemide (DEMADEX) 20 MG tablet Take 2 tablets (40 mg total) by mouth daily.  04/08/17  Yes Laurey MoraleMcLean, Everhett Bozard S, MD    Past Medical History: Past Medical History:  Diagnosis Date  . AICD (automatic cardioverter/defibrillator) present 09/2015   [facility] [employer] [place] VR model [id number] (serial Number [id number]) ICD   . Chronic edema   . CMI (chronic mesenteric ischemia) (HCC)   . Coronary artery disease   . Gout   . Gout   . Hypertension   . Ischemic cardiomyopathy    EF 25%  . Myocardial infarction [facility](HCC)    "they saw I'd had one; not sure when but it was before 2012"  . Obesity   . Type II diabetes mellitus (HCC)     Past Surgical History: Past Surgical History:  Procedure Laterality Date  . CARDIAC CATHETERIZATION  11/2010  . CARDIAC CATHETERIZATION N/A 07/15/2016   Procedure: Right Heart Cath;  Surgeon: Laurey Moralealton S Nika Yazzie, MD;  [facility];  Service: Cardiovascular;  Laterality: N/A;  . CORONARY ARTERY BYPASS GRAFT  11/2010   Sequential left internal mammary graft to the mid and distal LAD, sequential sapenous vein graft to the first, second, and third obutuse marginal branches  of the  left circumflex  coronary artery.  Saphenous vein graft to the distal right coronary artery.  . EP IMPLANTABLE DEVICE N/A 10/06/2015   Procedure: ICD Implant;  Surgeon: Will Jorja LoaMartin Camnitz, MD; [facility] [employer] [place] VR model [id number] (serial Number [id number]) ICD implanted L chest   . RIGHT HEART CATH N/A 11/27/2016   Procedure: Right Heart Cath;  Surgeon: Laurey Moralealton S Giovany Cosby, MD;  [facility];  Service: Cardiovascular;  Laterality: N/A;  . SHOULDER OPEN ROTATOR CUFF REPAIR Right ~ 2000    Family History: Family History  Problem Relation Age of Onset  . Hypertension Mother   . Diabetes type II Mother   . Hypertension Father   . Hypertension Sister   . Hypertension Paternal Grandmother   . Diabetes Other   . Hypertension Other     Social History: Social History   Social History  . Marital status: Married    Spouse name: N/A  .  Number of children: N/A  . Years of education: N/A   Occupational History  . Truck Advertising copywriterDriver     Instructional driver   Social History Main Topics  . Smoking status: Never Smoker  . Smokeless tobacco: Former NeurosurgeonUser    Types: Chew     Comment: "randomly chewed in my 20's"  . Alcohol use 0.0 oz/week     Comment: 10/06/2015 "might have a beer q couple weeks"  . Drug use: No  . Sexual activity: Not Currently   Other Topics Concern  . Not on file   Social History Narrative  Pt lives in [place] with spouse.   Worked as a Solicitor, though now on disability    Allergies:  Allergies  Allergen Reactions  . Spironolactone Other (See Comments)    Increases potassium levels    Objective:    Vital Signs:   Temp:  [99.5 F (37.5 C)] 99.5 F (37.5 C) (06/22 0955) Pulse Rate:  [61-123] 68 (06/22 1346) Resp:  [6-20] 6 (06/22 1346) BP: (113-124)/(72-83) 113/72 (06/22 1346) SpO2:  [99 %-100 %] 100 % (06/22 1346) Weight:  [210 lb (95.3 kg)] 210 lb (95.3 kg) (06/22 0955)   Filed Weights   04/12/2017 0955  Weight: 210 lb (95.3 kg)    Physical Exam: General:  Well appearing. No resp difficulty. In bed.  HEENT: normal Neck: supple. JVP to jaw  . Carotids 2+ bilat; no bruits. No lymphadenopathy or thryomegaly appreciated. Cor: PMI nondisplaced. Regular rate & rhythm. No rubs, gallops or murmurs. Lungs: clear Abdomen: soft, nontender, nondistended. No hepatosplenomegaly. No bruits or masses. Good bowel sounds. Extremities: no cyanosis, clubbing, rash, R and LLE 2+ edmea. Gouty changes to R and L hand. edema Neuro: alert & orientedx3, cranial nerves grossly intact. moves all 4 extremities w/o difficulty. Affect pleasant  Telemetry: NSR 70s   Labs: Basic Metabolic Panel:  Recent Labs Lab 04/08/17 1209  NA 134*  K 3.5  CL 104  CO2 22  GLUCOSE 159*  BUN 37*  CREATININE 2.03*  CALCIUM 8.1*    Liver Function Tests: No results for input(s): AST, ALT,  ALKPHOS, BILITOT, PROT, ALBUMIN in the last 168 hours. No results for input(s): LIPASE, AMYLASE in the last 168 hours. No results for input(s): AMMONIA in the last 168 hours.  CBC:  Recent Labs Lab 04/13/2017 1027  WBC 6.3  HGB 8.4*  HCT 26.4*  MCV 85.7  PLT 358    Cardiac Enzymes: No results for input(s): CKTOTAL, CKMB, CKMBINDEX, TROPONINI in the last 168 hours.  BNP: BNP (last 3 results)  Recent Labs  12/17/16 1157 03/24/17 1220 04/08/17 1209  BNP 1,612.8* 1,147.2* 2,471.2*    ProBNP (last 3 results) No results for input(s): PROBNP in the last 8760 hours.   CBG:  Recent Labs Lab 04/07/2017 1029  GLUCAP 112*    Coagulation Studies:  Recent Labs  04/14/2017 1027  LABPROT 16.1*  INR 1.28    Other results: EKG: .  Imaging:  No results found.      Assessment/Plan/Discussion    1. A/C Biventricular Systolic Heart Failure R>L -ICM  ECHO 03/2017 EF 15%.  RHC with volume overload and preserved output. Marked volume overload. Give 80 mg IV lasix now then diurese lasix drip 12 mg per hour. Start milrinone 0.25 mcg.  Place PICC and follow CVP/CO-OX.  Cut beta blocker back to 6.25 mg twice a day ( prior to admit he was on 12.5 mg twice a day)  Hold bidil. Need to see how much blood pressure he has with lasix drip and milrinone. He will continue LVAD work up. R sided failure may be prohibitive for LVAD. Hopefully he will improve with milrinone.  Not candidate for [facility] per [facility] due to gout and mobility limitations.  HFSW completing LVAD assessment.  CT surgery aware of admit.  2. CKD Stage III- Follow renal function closely. Creatinine baseline ~1.7-1.8, creatinine up to 2 earlier this week. Daily BMET.   3. Gout- continue uloric and colchicine daily with diuresis.  4. DM- continue glipizide. Add sliding scale.  5. CAD S/P  CABG 2012- continue aspirin, statin and bb.   Length of Stay: 0  Amy Clegg NP-C  04/07/2017, 2:00 PM  Advanced Heart Failure  Team Pager [phone] (M-F; 7a - 4p)  Please contact [facility] for night-coverage after hours (4p -7a ) and weekends on [email or web address]  Patient seen with NP, agree with the above note.    Patient admitted after RHC. He will need diuresis.  Given elevated creatinine and pulmonary hypertension as well as significant RV failure, I am going to use milrinone 0.25 mcg/kg/min.  I will give him Lasix 80 mg IV x 1 then Lasix gtt at 12 mg/hr.  Follow renal function closely.  Will place PICC to follow CVP and co-ox.   We have been working him up for LVAD.  Will need to evaluate the RV carefully, may need repeat RHC after diuresis. PAPi 1.95 so hopefully RV failure is not prohibitive.   Marca Ancona 04/10/2017 5:21 PM

## 2017-04-11 NOTE — H&P (View-Only) (Signed)
Patient ID: James [name], male   DOB: 1961/05/22, 56 y.o.   MRN: [id number]     Advanced Heart Failure Clinic Note   PCP: Dr. Elvina Mattes Rheumatology:  Dr. Amil Amen HF Cardiology: Dr. Aundra Dubin  56 yo with history of gout, DM2, HTN, CKD, CAD s/p CABG, and chronic systolic CHF presents for CHF clinic evaluation.  He had CABG x 6 in 2/12.  He has a long-standing ischemic cardiomyopathy, echo in 10/16 with EF 20-25%.  He also has severe tophaceous gout.     Now s/p St Jude ICD 10/06/15.  9/17 echo with EF 15%, mild to moderately decreased RV systolic function.  CPX in 12/17 showed severe HF limitation.  Terry in 12/17 showed mildly elevated filling pressures with preserved cardiac output.   Patient was seen at [facility] 4/18 for transplant evaluation.  RHC showed preserved CI at 3.2.  He had a jump in his PSA that corrected and was likely either error or transient prostatitis.  He had severe gout flare while in the hospital at [facility].  He ended up not being listed for transplant as there was concern about mobility limitation with gout and high risk of gout flaring with post-transplant meds.   Echo was done today.  EF 15% with mildly dilated LV, mild RV dilation with moderately decreased systolic function, dilated IVC, PASP 68 mmHg, mild to moderate MR.   Today, weight is up 11 lbs.  He feels like he has been in a "gradual decline" over the last 2-3 months.  Fatigues easily, not able to walk as far. Still trying to go to the [facility].  He was tired walking in the office today and mildly short of breath.  No lightheadedness/dizziness.  No palpitations.  He is in NSR.    Corevue: General downward impedance trend over the last couple of months.  No VT.    ECG: NSR, RBBB, 1st degree AVB  Labs (8/16): HCT 38.9, K 4.9, creatinine 1.81 Labs (08/01/2015) : K 3.7 Creatinine 1.49, BNP 658, LDL 85, HDL 65 Labs (09/18/2015): K 5.1 Creatinine 1.5 Labs (02/23/2016) K 4.6, Creatinine 1.58 Labs (03/01/2016): K 3.8, Creatinine  1.53 Labs (7/17): K 4.3, creatinine 1.53 Labs (9/17): K 4, creatinine 1.75, hgb 10.9 Labs (10/17): K 4.6, creatinine 1.6, digoxin 0.9 Labs (1/18): hgb 9.7, creatinine 1.78, BUN 63, K 5.6, albumin 3.4, AST/ALT normal, PSA 0.3 Labs (2/18): K 5, creatinine 1.93 Labs (3/18): K 4.8, creatinine 1.67, digoxin 0.6 Labs (4/18): K 4.2, creatinine 1.8, PSA 30 Labs (5/18): K 4.7, BUN 105, creatinine 1.79, digoxin 0.5 Labs (6/18): digoxin 0.5, K 3.6, creatinine 1.5 => 2.03, BNP 1147 => 2471  PMH: 1. Tophaceous gout: Sees Dr Amil Amen.  2. Hyperlipidemia 3. CKD 4. Type II diabetes 5. CAD: s/p CABG 2/12 with sequential LIMA-mid and distal LAD, sequential SVG to OM1/2/3, SVG-dRCA.   6. Chronic systolic CHF: Ischemic cardiomyopathy. Echo 2013 with EF 25%.  Echo (10/16) with EF 15-20%, restrictive diastolic function, moderately dilated RV with flattened interventricular septum, mild MR.   - CPX (10/16) with peak VO2 11.5, VE/VCO2 slope 33.6, RER 1.19 => moderate to severe HF limitation.  - Echo (9/17): EF 15% with mild LV dilation, mild RV dilation with mild to moderately decreased systolic function, moderate MR, PASP 69 mmHg, IVC dilated.  - St Jude ICD.  - Echo (9/17) with EF 15%, mild LV dilation, diffuse hypokinesis, mildly dilated RV with mild to moderately decreased systolic function, PASP 69 mmHg.  - RHC (9/17) with mean  RA 14, PA 70/35 mean 49, mean PCWP 24, RA/PCWP 0.5, CI 2.14 Fick/2.17 thermo, PVR 5.  - CPX (12/17) with peak VO2 8.9 (26% predicted), VE/VCO2 slope 51, RER 1.12 => severe HF limitation.  - RHC (12/17): mean RA 7, PA 62/22 mean 38, mean PCWP 18, CI 3.19 Fick/PVR 2.9 WU, CI 3.56 thermo/PVR 2.65 WU.  - RHC (4/18): mean RA 8, mean PCWP 18, PVR 3 WU, CI 3.2 - Echo (6/18): EF 15% with mildly dilated LV, mild RV dilation with moderately decreased systolic function, dilated IVC, PASP 68 mmHg, mild to moderate MR.  SH: Lives in [place], nonsmoker, truck Press photographer.    FH: HTN.   Father with CVA.   ROS: All systems reviewed and negative except as per HPI.   Current Outpatient Prescriptions  Medication Sig Dispense Refill  . acetaminophen (TYLENOL) 500 MG tablet Take 500-1,000 mg by mouth every 4 (four) hours as needed for headache (pain).    Marland Kitchen aspirin EC 81 MG tablet Take 81 mg by mouth daily.    Marland Kitchen atorvastatin (LIPITOR) 80 MG tablet TAKE ONE TABLET BY MOUTH DAILY 90 tablet 3  . carvedilol (COREG) 25 MG tablet Take 1 tablet (25 mg total) by mouth 2 (two) times daily with a meal. Please schedule appointment for refills. 180 tablet 3  . colchicine 0.6 MG tablet Take 1 tablet (0.6 mg total) by mouth daily. 30 tablet 6  . digoxin (LANOXIN) 0.125 MG tablet Take 0.5 tablets (0.0625 mg total) by mouth daily. 45 tablet 3  . febuxostat (ULORIC) 40 MG tablet Take 120 mg by mouth daily.    Marland Kitchen glipiZIDE (GLUCOTROL) 5 MG tablet TAKE 1 TABLET (5 MG TOTAL) BY MOUTH 2 (TWO) TIMES DAILY BEFORE MEALS (REFER FUTURE REFILLS TO PRIMARY CARE PHYSICIAN) 180 tablet 0  . isosorbide-hydrALAZINE (BIDIL) 20-37.5 MG tablet Take 1.5 tablets by mouth 3 (three) times daily. 90 tablet 0  . nitroGLYCERIN (NITROSTAT) 0.4 MG SL tablet Place 1 tablet (0.4 mg total) under the tongue every 5 (five) minutes as needed for chest pain. (Patient not taking: Reported on 03/24/2017) 25 tablet 2  . torsemide (DEMADEX) 20 MG tablet Take 2 tablets (40 mg total) by mouth daily. 60 tablet 3   No current facility-administered medications for this encounter.    BP 102/60 (BP Location: Left Arm, Patient Position: Sitting, Cuff Size: Normal)   Pulse 65   Wt 215 lb 9.6 oz (97.8 kg)   SpO2 100%   BMI 29.24 kg/m    Wt Readings from Last 3 Encounters:  04/08/17 215 lb 9.6 oz (97.8 kg)  03/24/17 204 lb (92.5 kg)  03/06/17 211 lb (95.7 kg)    General: NAD.  HEENT: normal Neck: supple. JVP 12 cm. Carotids 2+ bilat; no bruits. No thyromegaly or nodule noted. Cor: PMI lateral. RRR, +S3, 1/6 HSM apex/LLSB Lungs: CTAB,  normal effort. Abdomen: soft, non-tender, distended, no HSM. No bruits or masses. +BS  Extremities: no cyanosis, clubbing, rash, R and LLE no edema. Bilateral hands with significant gouty changes and prominent tophi. 2+ edema 3/4 to knee on left, 2+ edema 3/4 to knee on right.   Neuro: alert & orientedx3, cranial nerves grossly intact. moves all 4 extremities w/o difficulty. Affect pleasant   Assessment/Plan: 1. Chronic systolic CHF: Ischemic cardiomyopathy.  EF 25% in 2013, EF 20-25% on 10/16 echo with evidence for RV volume overload.  Severe functional deficit by CPX in 12/17, worse than prior study.  He has a Research officer, political party ICD.  [place] in 12/17 and again in 4/18 at [facility] showed only mildly elevated filling pressures and preserved cardiac output.  Most recent echo was done today and reviewed, showing EF 15% with mild LV dilation, mildly dilated RV with moderately decreased RV systolic function.  Recently, he has been doing worse.  Now NYHA class IIIb symptoms.  He is volume overloaded on exam.  Creatinine up to 2 today, suspect cardiorenal.  He was turned down for transplant with severe tophaceous gout and concern that symptoms would worsen post-transplant.  - At this point, I think that he will need an LVAD.  I do not want to miss a potential narrow window, especially as creatinine has gone up.  Hopefully, creatinine rise is a function of worsening volume status and will improve with diuresis.  He has RV dysfunction, but I do not think that his RV appears prohibitive for LVAD.  He has been seen by Dr. Cyndia Bent and has met with current LVAD patients.  Currently undergoing workup with our LVAD team.  I would like him to return on Friday for [place] followed by admission for volume/hemodynamic optimization and likely LVAD placement during this admission. We discussed this plan and he agrees.  - Increase torsemide to 40 mg daily until he returns for RHC, will likely need IV diuresis in the hospital and possibly milrinone.    - No Entresto/ACEI/ARB with AKI and no spironolactone with elevated K.  - Cut back on Coreg to 12.5 mg bid.  - Continue Bidil 1.5 tabs tid, no BP room to titrate up.    - Continue digoxin 0.0625 daily, check level.  - RBBB, so not good candidate for CRT upgrade.  - Wear graded compression stockings.  2. Gout: Severe tophaceous gout.  - On Uloric.  Seeing Dr Amil Amen now.  - As we will be augmenting his diuresis, I am going to have him go ahead and start on daily colchicine again.  3. CKD stage III: May be related to HTN, diabetes, hyperuricemia.  However, cardiorenal syndrome is likely playing a role as well.  Creatinine is higher today, may be related to volume overload.  Hopefully will improve with diuresis +/- milrinone.  4. CAD: s/p CABG in 2012.  No chest pain. Continue ASA 81 and atorvastatin.  Would not do coronary angiography at this point with elevated creatinine.  I think the chance that there is revascularizable disease that will improve his LV function is low.   He will come Friday for [place] and likely admission after that.   Loralie Champagne, MD  04/08/17

## 2017-04-12 ENCOUNTER — Encounter (HOSPITAL_COMMUNITY): Payer: Self-pay | Admitting: *Deleted

## 2017-04-12 ENCOUNTER — Inpatient Hospital Stay (HOSPITAL_COMMUNITY): Payer: Medicare PPO

## 2017-04-12 DIAGNOSIS — D631 Anemia in chronic kidney disease: Secondary | ICD-10-CM

## 2017-04-12 LAB — LIPID PANEL
Cholesterol: 79 mg/dL (ref 0–200)
HDL: 23 mg/dL — ABNORMAL LOW (ref >40)
LDL Cholesterol: 43 mg/dL (ref 0–99)
Total CHOL/HDL Ratio: 3.4 RATIO
Triglycerides: 64 mg/dL (ref <150)
VLDL: 13 mg/dL (ref 0–40)

## 2017-04-12 LAB — BASIC METABOLIC PANEL
ANION GAP: 11 (ref 5–15)
BUN: 35 mg/dL — ABNORMAL HIGH (ref 6–20)
CALCIUM: 7.4 mg/dL — AB (ref 8.9–10.3)
CO2: 22 mmol/L (ref 22–32)
Chloride: 103 mmol/L (ref 101–111)
Creatinine, Ser: 1.58 mg/dL — ABNORMAL HIGH (ref 0.61–1.24)
GFR calc Af Amer: 56 mL/min — ABNORMAL LOW (ref >60)
GFR calc non Af Amer: 48 mL/min — ABNORMAL LOW (ref >60)
Glucose, Bld: 71 mg/dL (ref 65–99)
Potassium: 3.4 mmol/L — ABNORMAL LOW (ref 3.5–5.1)
Sodium: 136 mmol/L (ref 135–145)

## 2017-04-12 LAB — TYPE AND SCREEN
ABO/RH(D): A POS
Antibody Screen: NEGATIVE

## 2017-04-12 LAB — TSH: TSH: 4.928 u[IU]/mL — ABNORMAL HIGH (ref 0.350–4.500)

## 2017-04-12 LAB — IRON AND TIBC
IRON: 28 ug/dL — AB (ref 45–182)
SATURATION RATIOS: 17 % — AB (ref 17.9–39.5)
TIBC: 162 ug/dL — AB (ref 250–450)
UIBC: 134 ug/dL

## 2017-04-12 LAB — GLUCOSE, CAPILLARY
GLUCOSE-CAPILLARY: 81 mg/dL (ref 65–99)
GLUCOSE-CAPILLARY: 130 mg/dL — AB (ref 65–99)
GLUCOSE-CAPILLARY: 135 mg/dL — AB (ref 65–99)
Glucose-Capillary: 63 mg/dL — ABNORMAL LOW (ref 65–99)
Glucose-Capillary: 117 mg/dL — ABNORMAL HIGH (ref 65–99)

## 2017-04-12 LAB — COOXEMETRY PANEL
CARBOXYHEMOGLOBIN: 1.3 % (ref 0.5–1.5)
Methemoglobin: 1.2 % (ref 0.0–1.5)
O2 SAT: 72 %
Total hemoglobin: 7.3 g/dL — ABNORMAL LOW (ref 12.0–16.0)

## 2017-04-12 LAB — CBC
HEMATOCRIT: 22.6 % — AB (ref 39.0–52.0)
Hemoglobin: 7.1 g/dL — ABNORMAL LOW (ref 13.0–17.0)
MCH: 26.9 pg (ref 26.0–34.0)
MCHC: 31.4 g/dL (ref 30.0–36.0)
MCV: 85.6 fL (ref 78.0–100.0)
Platelets: 308 10*3/uL (ref 150–400)
RBC: 2.64 MIL/uL — ABNORMAL LOW (ref 4.22–5.81)
RDW: 19.9 % — AB (ref 11.5–15.5)
WBC: 4.3 10*3/uL (ref 4.0–10.5)

## 2017-04-12 LAB — OCCULT BLOOD X 1 CARD TO LAB, STOOL: Fecal Occult Bld: NEGATIVE

## 2017-04-12 LAB — PREALBUMIN: Prealbumin: <5 mg/dL — ABNORMAL LOW (ref 18–38)

## 2017-04-12 LAB — FERRITIN: FERRITIN: 264 ng/mL (ref 24–336)

## 2017-04-12 LAB — VITAMIN B12: Vitamin B-12: 585 pg/mL (ref 180–914)

## 2017-04-12 MED ORDER — NON FORMULARY: 3.0000 mg | Freq: Every day | Status: DC

## 2017-04-12 MED ORDER — POTASSIUM CHLORIDE CRYS ER 20 MEQ PO TBCR
40.0000 meq | EXTENDED_RELEASE_TABLET | Freq: Once | ORAL | Status: AC
  Administered 2017-04-12: 40 meq via ORAL
  Filled 2017-04-12: qty 2

## 2017-04-12 MED ORDER — FE FUMARATE-B12-VIT C-FA-IFC PO CAPS
1.0000 | ORAL_CAPSULE | Freq: Three times a day (TID) | ORAL | Status: DC
  Administered 2017-04-12 – 2017-04-14 (×6): 1 via ORAL
  Filled 2017-04-12 (×7): qty 1

## 2017-04-12 MED ORDER — MELATONIN 3 MG PO TABS
3.0000 mg | ORAL_TABLET | Freq: Every day | ORAL | Status: DC
  Administered 2017-04-13 – 2017-04-16 (×4): 3 mg via ORAL
  Filled 2017-04-12 (×5): qty 1

## 2017-04-12 MED ORDER — AMIODARONE HCL 200 MG PO TABS
400.0000 mg | ORAL_TABLET | Freq: Two times a day (BID) | ORAL | Status: DC
  Administered 2017-04-12 – 2017-04-15 (×8): 400 mg via ORAL
  Filled 2017-04-12 (×8): qty 2

## 2017-04-12 NOTE — Consult Note (Signed)
Consultation  Referring Provider: Marca Ancona, MD Primary Care Physician:  Joaquin Courts, DO Primary Gastroenterologist:  none/unassigned  Reason for Consultation: Anemia  HPI: James [name] is a 56 y.o. male  who was admitted yesterday with acute congestive heart failure with complaints of fatigue. He had also been noted to have gradual rise in his creatinine and therefore underwent right heart cath yesterday which did confirm volume overload. He is being considered for LVAD placement Patient also has history of adult-onset diabetes mellitus, hypertension, chronic kidney disease stage III, coronary artery disease status post CABG in 2012 and severe congestive heart failure with current EF of 15%. He is status post ICD placement. He is also struggled with Tophacous gout. He is currently on Colchicine and Uloric.. We are asked to see patient regarding anemia Current Hemoglobin 8.4/Hematocrit 26.4/MCV of 85.7, Platelets 358. 1 Month Ago Hemoglobin Was 7.7 Hematocrit of 23, and 5 Months Ago Hemoglobin 9.7 Hematocrit of 29.3. September 2017 Hemoglobin of 10.9. His Hemoglobin Was Normal in 2016 at 13.2 Today Ferritin Is 264, B12 585, Serum Iron 28/TIBC 162/Sats 17. Patient has not had any prior GI evaluation. He says he has not been aware of any blood in his stool or dark stool and says that he is patent attention for some time. He has no complaints of abdominal pain. No chronic heartburn or indigestion no dysphagia or odynophagia. Appetite is been fine, weight has been up but he is volume overloaded. He denies any difficulty with his bowels, constipation or diarrhea other than associated with higher doses of colchicine He is on aspirin 81 mg daily no NSAIDs and no other blood thinners. Family history is negative for colon cancer.  Stool for occult blood pending today  Past Medical History:  Diagnosis Date  . AICD (automatic cardioverter/defibrillator) present 09/2015   [facility] [place] VR model [id number] (serial Number [id number]) ICD   . Chronic edema   . CMI (chronic mesenteric ischemia) (HCC)   . Coronary artery disease   . Gout   . Gout   . Hypertension   . Ischemic cardiomyopathy    EF 25%  . Myocardial infarction [facility])    "they saw I'd had one; not sure when but it was before 2012"  . Obesity   . Type II diabetes mellitus (HCC)     Past Surgical History:  Procedure Laterality Date  . CARDIAC CATHETERIZATION  11/2010  . CARDIAC CATHETERIZATION N/A 07/15/2016   Procedure: Right Heart Cath;  Surgeon: Laurey Morale, MD;  Location: [facility] INVASIVE CV LAB;  Service: Cardiovascular;  Laterality: N/A;  . CORONARY ARTERY BYPASS GRAFT  11/2010   Sequential left internal mammary graft to the mid and distal LAD, sequential sapenous vein graft to the first, second, and third obutuse marginal branches  of the  left circumflex  coronary artery.  Saphenous vein graft to the distal right coronary artery.  . EP IMPLANTABLE DEVICE N/A 10/06/2015   Procedure: ICD Implant;  Surgeon: Will Jorja Loa, MD; [facility] [place] VR model [id number] (serial Number [id number]) ICD implanted L chest   . RIGHT HEART CATH N/A 11/27/2016   Procedure: Right Heart Cath;  Surgeon: Laurey Morale, MD;  Location: [facility] INVASIVE CV LAB;  Service: Cardiovascular;  Laterality: N/A;  . RIGHT HEART CATH N/A 04/13/2017   Procedure: Right Heart Cath;  Surgeon: Laurey Morale, MD;  Location: [facility] INVASIVE CV LAB;  Service: Cardiovascular;  Laterality: N/A;  . SHOULDER OPEN  ROTATOR CUFF REPAIR Right ~ 2000    Prior to Admission medications   Medication Sig Start Date End Date Taking? Authorizing Provider  acetaminophen (TYLENOL) 500 MG tablet Take 500-1,000 mg by mouth every 4 (four) hours as needed for headache (pain).   Yes [provider]  aspirin EC 81 MG tablet Take 81 mg by mouth daily.   Yes [provider]  atorvastatin (LIPITOR) 80 MG tablet TAKE ONE TABLET BY  MOUTH DAILY 09/20/16  Yes Laurey Morale, MD  carvedilol (COREG) 25 MG tablet Take 0.5 tablets (12.5 mg total) by mouth 2 (two) times daily with a meal. Please schedule appointment for refills. 04/08/17  Yes Laurey Morale, MD  Colchicine (MITIGARE) 0.6 MG CAPS Take 1 capsule by mouth 2 (two) times daily.   Yes [provider]  colchicine 0.6 MG tablet Take 1 tablet (0.6 mg total) by mouth daily. Patient taking differently: Take 0.6 mg by mouth daily as needed (gout).  04/08/17  Yes Laurey Morale, MD  digoxin (LANOXIN) 0.125 MG tablet Take 0.5 tablets (0.0625 mg total) by mouth daily. 02/20/17  Yes Laurey Morale, MD  febuxostat (ULORIC) 40 MG tablet Take 120 mg by mouth daily.   Yes [provider]  glipiZIDE (GLUCOTROL) 5 MG tablet TAKE 1 TABLET (5 MG TOTAL) BY MOUTH 2 (TWO) TIMES DAILY BEFORE MEALS (REFER FUTURE REFILLS TO PRIMARY CARE PHYSICIAN) 04/24/16  Yes Hochrein, Fayrene Fearing, MD  isosorbide-hydrALAZINE (BIDIL) 20-37.5 MG tablet Take 1.5 tablets by mouth 3 (three) times daily. 03/24/17  Yes Laurey Morale, MD  nitroGLYCERIN (NITROSTAT) 0.4 MG SL tablet Place 1 tablet (0.4 mg total) under the tongue every 5 (five) minutes as needed for chest pain. 02/18/17 05/07/20 Yes Laurey Morale, MD  torsemide (DEMADEX) 20 MG tablet Take 2 tablets (40 mg total) by mouth daily. 04/08/17  Yes Laurey Morale, MD    Current Facility-Administered Medications  Medication Dose Route Frequency Provider Last Rate Last Dose  . 0.9 %  sodium chloride infusion  250 mL Intravenous PRN Laurey Morale, MD      . 0.9 %  sodium chloride infusion   Intravenous Continuous Laurey Morale, MD 10 mL/hr at 04/12/17 0305    . acetaminophen (TYLENOL) tablet 650 mg  650 mg Oral Q4H PRN Laurey Morale, MD      . amiodarone (PACERONE) tablet 400 mg  400 mg Oral BID Laurey Morale, MD   400 mg at 04/12/17 0850  . aspirin chewable tablet 81 mg  81 mg Oral Pre-Cath Laurey Morale, MD      . aspirin EC  tablet 81 mg  81 mg Oral Daily Clegg, Amy D, NP   81 mg at 04/12/17 1610  . atorvastatin (LIPITOR) tablet 80 mg  80 mg Oral q1800 Clegg, Amy D, NP   80 mg at 04/12/2017 1723  . carvedilol (COREG) tablet 6.25 mg  6.25 mg Oral BID WC Clegg, Amy D, NP   6.25 mg at 04/12/17 0850  . Chlorhexidine Gluconate Cloth 2 % PADS 6 each  6 each Topical Q0600 Kerin Perna, MD   6 each at 04/12/17 0600  . colchicine tablet 0.6 mg  0.6 mg Oral Daily Laurey Morale, MD   0.6 mg at 04/12/17 0848  . digoxin (LANOXIN) tablet 0.0625 mg  0.0625 mg Oral Daily Clegg, Amy D, NP   0.0625 mg at 04/12/17 0851  . febuxostat (ULORIC) tablet 120 mg  120  mg Oral Daily Clegg, Amy D, NP   120 mg at 04/12/17 0848  . ferrous fumarate-b12-vitamic C-folic acid (TRINSICON / FOLTRIN) capsule 1 capsule  1 capsule Oral TID PC Donata Clay, Theron Arista, MD      . furosemide (LASIX) 250 mg in dextrose 5 % 250 mL (1 mg/mL) infusion  15 mg/hr Intravenous Continuous Laurey Morale, MD 15 mL/hr at 04/12/17 1141 15 mg/hr at 04/12/17 1141  . glipiZIDE (GLUCOTROL) tablet 5 mg  5 mg Oral BID AC Clegg, Amy D, NP   5 mg at 04/12/17 0852  . heparin injection 5,000 Units  5,000 Units Subcutaneous Q8H Laurey Morale, MD   5,000 Units at 04/12/17 0534  . milrinone (PRIMACOR) 20 MG/100 ML (0.2 mg/mL) infusion  0.25 mcg/kg/min Intravenous Continuous Laurey Morale, MD 7.1 mL/hr at 04/12/17 0305 0.25 mcg/kg/min at 04/12/17 0305  . mupirocin ointment (BACTROBAN) 2 % 1 application  1 application Nasal BID Kerin Perna, MD   1 application at 04/12/17 [id number]  . ondansetron (ZOFRAN) injection 4 mg  4 mg Intravenous Q6H PRN Laurey Morale, MD      . potassium chloride SA (K-DUR,KLOR-CON) CR tablet 40 mEq  40 mEq Oral Once Laurey Morale, MD      . sodium chloride flush (NS) 0.9 % injection 10-40 mL  10-40 mL Intracatheter Q12H Laurey Morale, MD   10 mL at 04/15/2017 2300  . sodium chloride flush (NS) 0.9 % injection 10-40 mL  10-40 mL Intracatheter PRN  Laurey Morale, MD      . sodium chloride flush (NS) 0.9 % injection 3 mL  3 mL Intravenous Q12H Laurey Morale, MD      . sodium chloride flush (NS) 0.9 % injection 3 mL  3 mL Intravenous PRN Laurey Morale, MD        Allergies as of 04/08/2017 - Review Complete 04/08/2017  Allergen Reaction Noted  . Spironolactone Other (See Comments) 03/01/2017    Family History  Problem Relation Age of Onset  . Hypertension Mother   . Diabetes type II Mother   . Hypertension Father   . Hypertension Sister   . Hypertension Paternal Grandmother   . Diabetes Other   . Hypertension Other     Social History   Social History  . Marital status: Married    Spouse name: N/A  . Number of children: N/A  . Years of education: N/A   Occupational History  . Truck Advertising copywriter   Social History Main Topics  . Smoking status: Never Smoker  . Smokeless tobacco: Former Neurosurgeon    Types: Chew     Comment: "randomly chewed in my 20's"  . Alcohol use 0.0 oz/week     Comment: 10/06/2015 "might have a beer q couple weeks"  . Drug use: No  . Sexual activity: Not Currently   Other Topics Concern  . Not on file   Social History Narrative   Pt lives in [place] with spouse.   Worked as a Solicitor, though now on disability    Review of Systems: Pertinent positive and negative review of systems were noted in the above HPI section.  All other review of systems was otherwise negative.Marland Kitchen  Physical Exam: Vital signs in last 24 hours: Temp:  [98 F (36.7 C)-99.6 F (37.6 C)] 98.2 F (36.8 C) (06/23 0453) Pulse Rate:  [61-123] 78 (06/23 0806) Resp:  [6-28] 25 (06/23 0806)  BP: (98-123)/(64-83) 115/82 (06/23 0806) SpO2:  [99 %-100 %] 100 % (06/23 0806) FiO2 (%):  [21 %] 21 % (06/22 1415) Weight:  [215 lb 8 oz (97.8 kg)-222 lb 11.2 oz (101 kg)] 215 lb 8 oz (97.8 kg) (06/23 0453) Last BM Date: 07/03/2017 General:   Alert,  Well-developed, well-nourished,  pleasant and cooperative in NAD Head:  Normocephalic and atraumatic. Eyes:  Sclera clear, no icterus.   Conjunctiva pink. Ears:  Normal auditory acuity. Nose:  No deformity, discharge,  or lesions. Mouth:  No deformity or lesions.   Neck:  Supple; no masses or thyromegaly. Lungs:  Clear throughout to auscultation.   No wheezes, crackles, or rhonchi.  Heart:  Regular rate and rhythm; no murmurs, clicks, rubs,  or gallops. Abdomen:  Soft,nontender, BS active,nonpalp mass or hsm.   Rectal:  Deferred  Msk:  Symmetrical without gross deformities. . Pulses:  Normal pulses noted. Extremities:  Without clubbing or edema. Neurologic:  Alert and  oriented x4;  grossly normal neurologically. Skin:  Intact without significant lesions or rashes.. Psych:  Alert and cooperative. Normal mood and affect.  Intake/Output from previous day: 06/22 0701 - 06/23 0700 In: 997 [P.O.:840; I.V.:157] Out: 1475 [Urine:1475] Intake/Output this shift: Total I/O In: 240 [P.O.:240] Out: 700 [Urine:700]  Lab Results:  Recent Labs  07/15/2017 1027 04/12/17 0527  WBC 6.3 4.3  HGB 8.4* 7.1*  HCT 26.4* 22.6*  PLT 358 308   BMET  Recent Labs  07/04/2017 1656 04/12/17 0527  NA 135 136  K 3.6 3.4*  CL 104 103  CO2 23 22  GLUCOSE 146* 71  BUN 35* 35*  CREATININE 1.62* 1.58*  CALCIUM 7.6* 7.4*   PT/INR  Recent Labs  07/04/2017 1027  LABPROT 16.1*  INR 1.28    IMPRESSION:  #191 56 year old African-American male with severe congestive heart failure, EF 15%, admitted with acute CHF and for consideration of LVAD #2 chronic kidney disease stage III #3 adult-onset diabetes mellitus #4 coronary artery disease status post CABG 6 2012 #5 status post ICD placement #6 tophaceous gout #7 chronic normocytic anemia-ferritin is normal, iron studies more consistent with an anemia of chronic disease. I suspect that his anemia is multifactorial and related to underlying chronic kidney disease, Uloric also  associated with agranulocytosis He has not had previous GI evaluation but is very high-risk candidate for sedation for endoscopic evaluation.   Plan:  Await stool Hemoccults Consider endoscopic evaluation this admission once his acute congestive heart failure has improved if felt strongly by cardiology this will change his management We will discuss further and follow-up.   Amy Esterwood  04/12/2017, 11:42 AM      Attending physician's note   I have taken a history, examined the patient and reviewed the chart. I agree with the Advanced Practitioner's note, impression and recommendations.   Severe, chronic CHF with acute CHF exacerbation. Normocytic anemia with iron studies, ferritin c/w ACD secondary to CKD. Stool hemoccults are pending. No GI symptoms. No prior colonoscopy.  He is at a high risk for procedure and sedation related complication with colonoscopy. If stool is heme positive will consider colonoscopy when his heart failure is optimally managed. If heme negative will need to discuss further with cardiology.   Claudette HeadMalcolm Chucky Homes, MD Clementeen GrahamFACG [phone] Mon-Fri 8a-5p [phone] after 5p, weekends, holidays

## 2017-04-12 NOTE — Progress Notes (Signed)
SUBJECTIVE: He says that he diuresed well overnight.  Weight is down this morning. No dyspnea/chest pain.  Co-ox 72% with CVP 16.  Creatinine down to 1.58.   Hemoglobin is low.  He denies BRBPR/melena.   Echo (6/18): EF 15%, mildly dilated/moderately reduced function RV.   RHC Procedural Findings: Hemodynamics (mmHg) RA mean 20 RV 71/22 PA 70/31 mean 45 PCWP mean 24 CVP/PCWP 0.83 PAPi 1.95 Oxygen saturations: PA 49% AO 100% Cardiac Output (Fick) 4.95  Cardiac Index (Fick) 2.28 PVR 4.24 WU Cardiac Output (Thermo) 5.74 Cardiac Index (Fick) 2.65 PVR 3.65 WU  Scheduled Meds: . amiodarone  400 mg Oral BID  . aspirin  81 mg Oral Pre-Cath  . aspirin EC  81 mg Oral Daily  . atorvastatin  80 mg Oral q1800  . carvedilol  6.25 mg Oral BID WC  . Chlorhexidine Gluconate Cloth  6 each Topical Q0600  . colchicine  0.6 mg Oral Daily  . digoxin  0.0625 mg Oral Daily  . febuxostat  120 mg Oral Daily  . glipiZIDE  5 mg Oral BID AC  . heparin  5,000 Units Subcutaneous Q8H  . mupirocin ointment  1 application Nasal BID  . potassium chloride  40 mEq Oral Once  . potassium chloride  40 mEq Oral Once  . sodium chloride flush  10-40 mL Intracatheter Q12H  . sodium chloride flush  3 mL Intravenous Q12H   Continuous Infusions: . sodium chloride    . sodium chloride 10 mL/hr at 04/12/17 0305  . furosemide (LASIX) infusion 12 mg/hr (04/12/17 0305)  . milrinone 0.25 mcg/kg/min (04/12/17 0305)   PRN Meds:.sodium chloride, acetaminophen, ondansetron (ZOFRAN) IV, sodium chloride flush, sodium chloride flush    Vitals:   2017-05-03 1931 05-03-2017 2030 04/12/17 0027 04/12/17 0453  BP: 98/64  104/68 106/69  Pulse: 72 73 74 76  Resp: (!) 23 13 20  (!) 28  Temp: 98 F (36.7 C)  99.6 F (37.6 C) 98.2 F (36.8 C)  TempSrc:   Oral   SpO2: 100% 100% 100% 100%  Weight:    215 lb 8 oz (97.8 kg)  Height:        Intake/Output Summary (Last 24 hours) at 04/12/17 0742 Last data filed at  04/12/17 0453  Gross per 24 hour  Intake           876.96 ml  Output             1075 ml  Net          -198.04 ml    LABS: Basic Metabolic Panel:  Recent Labs  40/98/11 1656 04/12/17 0527  NA 135 136  K 3.6 3.4*  CL 104 103  CO2 23 22  GLUCOSE 146* 71  BUN 35* 35*  CREATININE 1.62* 1.58*  CALCIUM 7.6* 7.4*   Liver Function Tests: No results for input(s): AST, ALT, ALKPHOS, BILITOT, PROT, ALBUMIN in the last 72 hours. No results for input(s): LIPASE, AMYLASE in the last 72 hours. CBC:  Recent Labs  05-03-17 1027 04/12/17 0527  WBC 6.3 4.3  HGB 8.4* 7.1*  HCT 26.4* 22.6*  MCV 85.7 85.6  PLT 358 308   Cardiac Enzymes: No results for input(s): CKTOTAL, CKMB, CKMBINDEX, TROPONINI in the last 72 hours. BNP: Invalid input(s): POCBNP D-Dimer: No results for input(s): DDIMER in the last 72 hours. Hemoglobin A1C: No results for input(s): HGBA1C in the last 72 hours. Fasting Lipid Panel:  Recent Labs  04/12/17 0527  CHOL 79  HDL 23*  LDLCALC 43  TRIG 64  CHOLHDL 3.4   Thyroid Function Tests:  Recent Labs  04/12/17 0527  TSH 4.928*   Anemia Panel: No results for input(s): VITAMINB12, FOLATE, FERRITIN, TIBC, IRON, RETICCTPCT in the last 72 hours.  RADIOLOGY: No results found.  PHYSICAL EXAM General: NAD Neck: JVP 16 cm, no thyromegaly or thyroid nodule.  Lungs: Clear to auscultation bilaterally with normal respiratory effort. CV: Lateral PMI.  Heart regular S1/S2, no S3/S4, no murmur.  2+ edema to knees.   Abdomen: Soft, nontender, no hepatosplenomegaly, no distention.  Neurologic: Alert and oriented x 3.  Psych: Normal affect. Extremities: No clubbing or cyanosis.   TELEMETRY: Personally reviewed telemetry pt in NSR with short runs ?atrial tachycardia and PVCs  ASSESSMENT AND PLAN: [age] yo with CAD s/p CABG, ischemic cardiomyopathy/chronic systolic CHF, tophaceous gout, and CKD stage 3 was admitted for diuresis and consideration for LVAD placement.   1. Acute/chronic systolic CHF: Ischemic cardiomyopathy.  St Jude ICD.  Echo (6/18) with EF 15%, mildly dilated RV with moderately decreased systolic function.  RHC yesterday with elevated filling pressures and evidence of RV failure though PAPi score 1.95 so not prohibitive.  [facility] with severe tophaceous gout and concern for worsening on transplant meds.  He is on milrinone gtt + Lasix gtt currently, good urine output overnight with creatinine down to 1.5.  Co-ox 72%.  CVP/JVP remain elevated.  - Continue milrinone 0.25.  - Increase Lasix gtt to 15 mg/hr and replace K.  - Continue lower dose Coreg 6.25 mg bid.  - Continue LVAD workup, [facility].  Will likely need repeat RHC next week to reassess RV after diuresis and on milrinone.  2. CKD stage 3: Creatinine improved somewhat today on milrinone and with diuresis.  Continue to watch closely.  3. Anemia: Hgb 7.1 today.  Patient denies signs of overt GI bleeding.  May be component of anemia of renal disease.  - Transfuse hgb < 7.  - Send Fe studies, B12.   - Will ask GI to see given plan for LVAD and need for warfarin post-LVAD.  4. CAD: s/p CABG 2012.  Stable, no chest pain.  5. Gout: Severe tophaceous gout.  On Uloric, will continue colchicine daily.  No pain currently.  6. Arrhythmias: PVCs, short runs AT noted on milrinone.  Will use amiodarone will on milrinone, start po.   Marca AnconaDalton McLean 04/12/2017 7:54 AM

## 2017-04-12 NOTE — Progress Notes (Signed)
CARDIAC REHAB PHASE I   Patient did not want to ambulate because he just got through having diarhea. He said it comes on quick and he doesn't have much time to get to the bathroom. He stated that if his stomach felt better he would walk in the halls himself later this evening.  Barnabas ListerMolly M Janos Shampine, MS 04/12/2017 2:19 PM

## 2017-04-12 NOTE — Progress Notes (Signed)
Hypoglycemic Event  CBG: 63  Treatment: grape juice  Symptoms: none  Follow-up CBG: Time:0804 CBG Result: 63  Possible Reasons for Event: unknown  Comments/MD notified:Dr. Mclean.     Docia ChuckBennett, Tahnee Cifuentes Christine

## 2017-04-13 DIAGNOSIS — N183 Chronic kidney disease, stage 3 (moderate): Secondary | ICD-10-CM

## 2017-04-13 LAB — BASIC METABOLIC PANEL
ANION GAP: 9 (ref 5–15)
BUN: 33 mg/dL — AB (ref 6–20)
CALCIUM: 7.8 mg/dL — AB (ref 8.9–10.3)
CO2: 26 mmol/L (ref 22–32)
Chloride: 101 mmol/L (ref 101–111)
Creatinine, Ser: 1.53 mg/dL — ABNORMAL HIGH (ref 0.61–1.24)
GFR calc Af Amer: 58 mL/min — ABNORMAL LOW (ref >60)
GFR, EST NON AFRICAN AMERICAN: 50 mL/min — AB (ref >60)
GLUCOSE: 84 mg/dL (ref 65–99)
Potassium: 3.9 mmol/L (ref 3.5–5.1)
Sodium: 136 mmol/L (ref 135–145)

## 2017-04-13 LAB — COOXEMETRY PANEL
Carboxyhemoglobin: 1.5 % (ref 0.5–1.5)
Methemoglobin: 0.8 % (ref 0.0–1.5)
O2 SAT: 68.1 %
TOTAL HEMOGLOBIN: 10.3 g/dL — AB (ref 12.0–16.0)

## 2017-04-13 LAB — GLUCOSE, CAPILLARY
Glucose-Capillary: 68 mg/dL (ref 65–99)
Glucose-Capillary: 87 mg/dL (ref 65–99)
Glucose-Capillary: 138 mg/dL — ABNORMAL HIGH (ref 65–99)
Glucose-Capillary: 122 mg/dL — ABNORMAL HIGH (ref 65–99)

## 2017-04-13 LAB — CBC
HCT: 27.4 % — ABNORMAL LOW (ref 39.0–52.0)
HEMOGLOBIN: 8.4 g/dL — AB (ref 13.0–17.0)
MCH: 26.2 pg (ref 26.0–34.0)
MCHC: 30.7 g/dL (ref 30.0–36.0)
MCV: 85.4 fL (ref 78.0–100.0)
Platelets: 287 10*3/uL (ref 150–400)
RBC: 3.21 MIL/uL — ABNORMAL LOW (ref 4.22–5.81)
RDW: 19.8 % — AB (ref 11.5–15.5)
WBC: 3.7 10*3/uL — ABNORMAL LOW (ref 4.0–10.5)

## 2017-04-13 LAB — CEA: CEA: 5.7 ng/mL — ABNORMAL HIGH (ref 0.0–4.7)

## 2017-04-13 MED ORDER — POTASSIUM CHLORIDE CRYS ER 20 MEQ PO TBCR
40.0000 meq | EXTENDED_RELEASE_TABLET | Freq: Two times a day (BID) | ORAL | Status: DC
  Administered 2017-04-13 – 2017-04-15 (×6): 40 meq via ORAL
  Filled 2017-04-13 (×3): qty 2
  Filled 2017-04-13 (×2): qty 4

## 2017-04-13 MED ORDER — METOLAZONE 5 MG PO TABS
2.5000 mg | ORAL_TABLET | Freq: Once | ORAL | Status: AC
  Administered 2017-04-13: 2.5 mg via ORAL
  Filled 2017-04-13: qty 1

## 2017-04-13 NOTE — Progress Notes (Signed)
Patient ID: James [name], male   DOB: 02-05-1961, 56 y.o.   MRN: [id number]   Heme negative stool Cardiology to discuss LVAD with CVTS Monday  Do not feel he needs endoscopic workup - heme negative stool and anemia most consistent with anemia of chronic disease -  if Cardiology or CVTS feels he needs a screening colonoscopy before LVAD - we can discuss further tomorrow. GI will follow up on Monday  Thanks

## 2017-04-13 NOTE — Progress Notes (Signed)
2 Days Post-Op Procedure(s) (LRB): Right Heart Cath (N/A) Subjective: Feels much better on milrinone Almost 10 pounds off with lasix drip- cvp 10 Chronic anemia  from HF- stool guaiac neg, Hb today 8.5 CT chest ok- RV enlarged to chest wall  Objective: Vital signs in last 24 hours: Temp:  [97.6 F (36.4 C)-98.7 F (37.1 C)] 97.6 F (36.4 C) (06/24 0537) Pulse Rate:  [70-74] 74 (06/24 0902) Cardiac Rhythm: Normal sinus rhythm (06/24 0900) Resp:  [13-24] 21 (06/24 0003) BP: (107-120)/(68-83) 112/79 (06/24 0902) SpO2:  [96 %-100 %] 100 % (06/24 0003) Weight:  [206 lb 6.4 oz (93.6 kg)] 206 lb 6.4 oz (93.6 kg) (06/24 0537)  Hemodynamic parameters for last 24 hours: CVP:  [8 mmHg-21 mmHg] 21 mmHg  Intake/Output from previous day: 06/23 0701 - 06/24 0700 In: 1006.1 [P.O.:720; I.V.:286.1] Out: 3351 [Urine:3350; Stool:1] Intake/Output this shift: Total I/O In: 338.8 [P.O.:240; I.V.:98.8] Out: 950 [Urine:950]       Exam    General- alert and comfortable   Lungs- clear without rales, wheezes   Cor- regular rate and rhythm, no murmur , gallop   Abdomen- soft, non-tender   Extremities - warm, non-tender, minimal edema   Neuro- oriented, appropriate, no focal weakness   Lab Results:  Recent Labs  04/12/17 0527 04/13/17 0606  WBC 4.3 3.7*  HGB 7.1* 8.4*  HCT 22.6* 27.4*  PLT 308 287   BMET:  Recent Labs  04/12/17 0527 04/13/17 0606  NA 136 136  K 3.4* 3.9  CL 103 101  CO2 22 26  GLUCOSE 71 84  BUN 35* 33*  CREATININE 1.58* 1.53*  CALCIUM 7.4* 7.8*    PT/INR:  Recent Labs  03/22/2017 1027  LABPROT 16.1*  INR 1.28   ABG    Component Value Date/Time   PHART 7.394 07/15/2016 1153   HCO3 23.4 04/04/2017 1329   TCO2 24 04/19/2017 1329   ACIDBASEDEF 1.0 03/31/2017 1329   O2SAT 68.1 04/13/2017 0608   CBG (last 3)   Recent Labs  04/12/17 1624 04/12/17 2123 04/13/17 0729  GLUCAP 130* 135* 68    Assessment/Plan: S/P Procedure(s) (LRB): Right Heart  Cath (N/A) repeat RHC on tues and VAD Thursday if CVP/PCWP improved He will accept blood transfusion   LOS: 2 days    Kathlee Nationseter Van Trigt III 04/13/2017

## 2017-04-13 NOTE — Progress Notes (Signed)
Patient ID: James [name], male   DOB: 1960-12-13, 56 y.o.   MRN: [id number]   SUBJECTIVE: Good diuresis yesterday.  Weight down, creatinine stable at 1.53.  Co-ox 68%, CVP 21.     Hemoglobin is low.  He denies BRBPR/melena.  FOBT negative.   Echo (6/18): EF 15%, mildly dilated/moderately reduced function RV.   RHC Procedural Findings: Hemodynamics (mmHg) RA mean 20 RV 71/22 PA 70/31 mean 45 PCWP mean 24 CVP/PCWP 0.83 PAPi 1.95 Oxygen saturations: PA 49% AO 100% Cardiac Output (Fick) 4.95  Cardiac Index (Fick) 2.28 PVR 4.24 WU Cardiac Output (Thermo) 5.74 Cardiac Index (Fick) 2.65 PVR 3.65 WU  Scheduled Meds: . amiodarone  400 mg Oral BID  . aspirin EC  81 mg Oral Daily  . atorvastatin  80 mg Oral q1800  . carvedilol  6.25 mg Oral BID WC  . Chlorhexidine Gluconate Cloth  6 each Topical Q0600  . colchicine  0.6 mg Oral Daily  . digoxin  0.0625 mg Oral Daily  . febuxostat  120 mg Oral Daily  . ferrous fumarate-b12-vitamic C-folic acid  1 capsule Oral TID PC  . glipiZIDE  5 mg Oral BID AC  . heparin  5,000 Units Subcutaneous Q8H  . Melatonin  3 mg Oral QHS  . metolazone  2.5 mg Oral Once  . mupirocin ointment  1 application Nasal BID  . potassium chloride  40 mEq Oral BID  . sodium chloride flush  10-40 mL Intracatheter Q12H  . sodium chloride flush  3 mL Intravenous Q12H   Continuous Infusions: . sodium chloride    . sodium chloride 3 mL/hr at 04/12/17 2000  . furosemide (LASIX) infusion 15 mg/hr (04/13/17 0527)  . milrinone 0.25 mcg/kg/min (04/13/17 0059)   PRN Meds:.sodium chloride, acetaminophen, ondansetron (ZOFRAN) IV, sodium chloride flush, sodium chloride flush    Vitals:   04/12/17 1627 04/12/17 2033 04/13/17 0003 04/13/17 0537  BP: 108/80 107/68 109/71 115/83  Pulse:  70 73   Resp:  (!) 24 (!) 21   Temp:  98.7 F (37.1 C) 98.6 F (37 C) 97.6 F (36.4 C)  TempSrc:    Oral  SpO2:  96% 100%   Weight:    206 lb 6.4 oz (93.6 kg)  Height:         Intake/Output Summary (Last 24 hours) at 04/13/17 0732 Last data filed at 04/13/17 0600  Gross per 24 hour  Intake           1006.1 ml  Output             3351 ml  Net          -2344.9 ml    LABS: Basic Metabolic Panel:  Recent Labs  40/98/11 0527 04/13/17 0606  NA 136 136  K 3.4* 3.9  CL 103 101  CO2 22 26  GLUCOSE 71 84  BUN 35* 33*  CREATININE 1.58* 1.53*  CALCIUM 7.4* 7.8*   Liver Function Tests: No results for input(s): AST, ALT, ALKPHOS, BILITOT, PROT, ALBUMIN in the last 72 hours. No results for input(s): LIPASE, AMYLASE in the last 72 hours. CBC:  Recent Labs  04/12/17 0527 04/13/17 0606  WBC 4.3 3.7*  HGB 7.1* 8.4*  HCT 22.6* 27.4*  MCV 85.6 85.4  PLT 308 287   Cardiac Enzymes: No results for input(s): CKTOTAL, CKMB, CKMBINDEX, TROPONINI in the last 72 hours. BNP: Invalid input(s): POCBNP D-Dimer: No results for input(s): DDIMER in the last 72 hours. Hemoglobin A1C: No results  for input(s): HGBA1C in the last 72 hours. Fasting Lipid Panel:  Recent Labs  04/12/17 0527  CHOL 79  HDL 23*  LDLCALC 43  TRIG 64  CHOLHDL 3.4   Thyroid Function Tests:  Recent Labs  04/12/17 0527  TSH 4.928*   Anemia Panel:  Recent Labs  04/12/17 0800  VITAMINB12 585  FERRITIN 264  TIBC 162*  IRON 28*    RADIOLOGY: Ct Abdomen Pelvis Wo Contrast  Result Date: 04/12/2017 CLINICAL DATA:  Patient has chronic systolic CHF, being considered for LVAD EXAM: CT CHEST, ABDOMEN AND PELVIS WITHOUT CONTRAST TECHNIQUE: Multidetector CT imaging of the chest, abdomen and pelvis was performed following the standard protocol without IV contrast. COMPARISON:  None. FINDINGS: CT CHEST FINDINGS Cardiovascular: Thoracic aorta is normal in caliber and configuration. Scattered aortic atherosclerosis. Cardiomegaly. No pericardial effusion. Diffuse coronary artery calcifications. Status post CABG. Mediastinum/Nodes: No mass or lymphadenopathy. Esophagus is unremarkable.  Trachea and central bronchi are unremarkable. Lungs/Pleura: Mild atelectasis/scarring and mild pleural thickening at each lung base. Lungs otherwise clear. Incidentally noted calcified granuloma at the right lung apex. Musculoskeletal: Mild degenerative spurring in the lower thoracic spine. Degenerative changes at the shoulders. No acute or suspicious osseous finding. Status post median sternotomy. Edema within the subcutaneous soft tissues suggesting anasarca. Bilateral gynecomastia. CT ABDOMEN PELVIS FINDINGS Hepatobiliary: Stones within the nondistended gallbladder. No focal liver abnormality is seen. No bile duct dilatation seen. Pancreas: Partially obscured.  Visualized portions unremarkable. Spleen: Normal in size without focal abnormality. Adrenals/Urinary Tract: Beam hardening artifact obscures portions of each kidney probable renal cysts bilaterally, difficult to definitively evaluate with Hounsfield unit measurement due to the beam hardening artifact. No renal stone or hydronephrosis seen. Bladder is unremarkable. Stomach/Bowel: Bowel is normal in caliber. No bowel wall thickening or evidence of bowel wall inflammation seen. Some portions of the bowel are obscured by the aforementioned beam hardening artifact. Vascular/Lymphatic: Aortic atherosclerosis. No enlarged lymph nodes seen. Reproductive: Unremarkable. Other: No free fluid or abscess collections seen. No free intraperitoneal air. Elevation of the left hemidiaphragm, with upper stomach located posterior to the left ventricle. Musculoskeletal: Degenerative changes of the thoracolumbar spine, moderate in degree. No acute or suspicious osseous finding. Ill-defined edema within the subcutaneous soft tissues of the abdomen and pelvis indicating anasarca. IMPRESSION: 1. No acute findings within the chest, abdomen or pelvis. 2. No anatomic variants identified. There is elevation of the left hemidiaphragm with stomach positioned posterior to the left  ventricle. 3. Cardiomegaly. Diffuse coronary artery calcifications. No pericardial effusion seen. 4. Cholelithiasis without evidence of acute cholecystitis. 5. Aortic atherosclerosis. 6. Anasarca. 7. Mild study limitations detailed above. Electronically Signed   By: Bary Richard M.D.   On: 04/12/2017 17:30   Ct Chest Wo Contrast  Result Date: 04/12/2017 CLINICAL DATA:  Patient has chronic systolic CHF, being considered for LVAD EXAM: CT CHEST, ABDOMEN AND PELVIS WITHOUT CONTRAST TECHNIQUE: Multidetector CT imaging of the chest, abdomen and pelvis was performed following the standard protocol without IV contrast. COMPARISON:  None. FINDINGS: CT CHEST FINDINGS Cardiovascular: Thoracic aorta is normal in caliber and configuration. Scattered aortic atherosclerosis. Cardiomegaly. No pericardial effusion. Diffuse coronary artery calcifications. Status post CABG. Mediastinum/Nodes: No mass or lymphadenopathy. Esophagus is unremarkable. Trachea and central bronchi are unremarkable. Lungs/Pleura: Mild atelectasis/scarring and mild pleural thickening at each lung base. Lungs otherwise clear. Incidentally noted calcified granuloma at the right lung apex. Musculoskeletal: Mild degenerative spurring in the lower thoracic spine. Degenerative changes at the shoulders. No acute or suspicious osseous finding.  Status post median sternotomy. Edema within the subcutaneous soft tissues suggesting anasarca. Bilateral gynecomastia. CT ABDOMEN PELVIS FINDINGS Hepatobiliary: Stones within the nondistended gallbladder. No focal liver abnormality is seen. No bile duct dilatation seen. Pancreas: Partially obscured.  Visualized portions unremarkable. Spleen: Normal in size without focal abnormality. Adrenals/Urinary Tract: Beam hardening artifact obscures portions of each kidney probable renal cysts bilaterally, difficult to definitively evaluate with Hounsfield unit measurement due to the beam hardening artifact. No renal stone or  hydronephrosis seen. Bladder is unremarkable. Stomach/Bowel: Bowel is normal in caliber. No bowel wall thickening or evidence of bowel wall inflammation seen. Some portions of the bowel are obscured by the aforementioned beam hardening artifact. Vascular/Lymphatic: Aortic atherosclerosis. No enlarged lymph nodes seen. Reproductive: Unremarkable. Other: No free fluid or abscess collections seen. No free intraperitoneal air. Elevation of the left hemidiaphragm, with upper stomach located posterior to the left ventricle. Musculoskeletal: Degenerative changes of the thoracolumbar spine, moderate in degree. No acute or suspicious osseous finding. Ill-defined edema within the subcutaneous soft tissues of the abdomen and pelvis indicating anasarca. IMPRESSION: 1. No acute findings within the chest, abdomen or pelvis. 2. No anatomic variants identified. There is elevation of the left hemidiaphragm with stomach positioned posterior to the left ventricle. 3. Cardiomegaly. Diffuse coronary artery calcifications. No pericardial effusion seen. 4. Cholelithiasis without evidence of acute cholecystitis. 5. Aortic atherosclerosis. 6. Anasarca. 7. Mild study limitations detailed above. Electronically Signed   By: Bary Richard M.D.   On: 04/12/2017 17:30    PHYSICAL EXAM General: NAD Neck: JVP 16+ cm, no thyromegaly or thyroid nodule.  Lungs: Clear to auscultation bilaterally with normal respiratory effort. CV: Lateral PMI.  Heart regular S1/S2, no S3/S4, no murmur.  1+ edema to knees.   Abdomen: Soft, nontender, no hepatosplenomegaly, no distention.  Neurologic: Alert and oriented x 3.  Psych: Normal affect. Extremities: No clubbing or cyanosis.   TELEMETRY: Personally reviewed telemetry pt in NSR   ASSESSMENT AND PLAN: 56 yo with CAD s/p CABG, ischemic cardiomyopathy/chronic systolic CHF, tophaceous gout, and CKD stage 3 was admitted for diuresis and consideration for LVAD placement.  1. Acute/chronic systolic  CHF: Ischemic cardiomyopathy.  St Jude ICD.  Echo (6/18) with EF 15%, mildly dilated RV with moderately decreased systolic function.  RHC yesterday with elevated filling pressures and evidence of RV failure though PAPi score 1.95 so not prohibitive.  He has been seen at [facility] and turned down for transplant with severe tophaceous gout and concern for worsening on transplant meds.  He is on milrinone gtt + Lasix gtt currently, good urine output yesterday with creatinine down to 1.53.  Co-ox 68%.  CVP 21 with JVP elevated.  - Continue milrinone 0.25.  - Continue Lasix gtt 15 mg/hr and replace K.  Will add dose of metolazone 2.5 x 1.  - Continue lower dose Coreg 6.25 mg bid.  - Continue LVAD workup, will discuss at [facility] Monday.  Will likely need repeat RHC next week to reassess RV after diuresis and on milrinone.  2. CKD stage 3: Creatinine improved somewhat today on milrinone and with diuresis.  Continue to watch closely.  3. Anemia: Hgb 8.4 today, higher.  Patient denies signs of overt GI bleeding and FOBT negative.  May be component of anemia of renal disease/chronic disease.  Fe studies do not show marked Fe deficiency.  Seen by GI, they do not feel strongly that he needs endoscopies.  - Will discuss with cardiac surgeons.  4. CAD: s/p CABG 2012.  Stable, no chest pain.  5. Gout: Severe tophaceous gout.  On Uloric, will continue colchicine daily.  No pain currently.  6. Arrhythmias: PVCs, short runs AT noted on milrinone.  Quiescent on amiodarone.   Marca AnconaDalton Rush Salce 04/13/2017 7:32 AM

## 2017-04-14 ENCOUNTER — Encounter (HOSPITAL_COMMUNITY): Payer: Self-pay | Admitting: General Practice

## 2017-04-14 ENCOUNTER — Inpatient Hospital Stay (HOSPITAL_COMMUNITY): Payer: Medicare PPO

## 2017-04-14 DIAGNOSIS — Z515 Encounter for palliative care: Secondary | ICD-10-CM

## 2017-04-14 DIAGNOSIS — Z7189 Other specified counseling: Secondary | ICD-10-CM

## 2017-04-14 DIAGNOSIS — I340 Nonrheumatic mitral (valve) insufficiency: Secondary | ICD-10-CM

## 2017-04-14 DIAGNOSIS — Z1211 Encounter for screening for malignant neoplasm of colon: Secondary | ICD-10-CM

## 2017-04-14 DIAGNOSIS — D638 Anemia in other chronic diseases classified elsewhere: Secondary | ICD-10-CM

## 2017-04-14 LAB — CBC
HEMATOCRIT: 24.8 % — AB (ref 39.0–52.0)
Hemoglobin: 7.8 g/dL — ABNORMAL LOW (ref 13.0–17.0)
MCH: 26.7 pg (ref 26.0–34.0)
MCHC: 31.5 g/dL (ref 30.0–36.0)
MCV: 84.9 fL (ref 78.0–100.0)
PLATELETS: 322 10*3/uL (ref 150–400)
RBC: 2.92 MIL/uL — ABNORMAL LOW (ref 4.22–5.81)
RDW: 19.7 % — AB (ref 11.5–15.5)
WBC: 5.7 10*3/uL (ref 4.0–10.5)

## 2017-04-14 LAB — BASIC METABOLIC PANEL
Anion gap: 8 (ref 5–15)
BUN: 32 mg/dL — AB (ref 6–20)
CHLORIDE: 100 mmol/L — AB (ref 101–111)
CO2: 28 mmol/L (ref 22–32)
CREATININE: 1.56 mg/dL — AB (ref 0.61–1.24)
Calcium: 8.2 mg/dL — ABNORMAL LOW (ref 8.9–10.3)
GFR calc Af Amer: 56 mL/min — ABNORMAL LOW (ref >60)
GFR calc non Af Amer: 49 mL/min — ABNORMAL LOW (ref >60)
GLUCOSE: 83 mg/dL (ref 65–99)
POTASSIUM: 4.2 mmol/L (ref 3.5–5.1)
SODIUM: 136 mmol/L (ref 135–145)

## 2017-04-14 LAB — HEMATOLOGY COMMENTS:

## 2017-04-14 LAB — ECHOCARDIOGRAM COMPLETE
Height: 72 in
Weight: 3164.8 oz

## 2017-04-14 LAB — FOLATE RBC
FOLATE, HEMOLYSATE: 348.7 ng/mL
FOLATE, RBC: 1268 ng/mL (ref >498)
HEMATOCRIT: 27.5 % — AB (ref 37.5–51.0)

## 2017-04-14 LAB — GLUCOSE, CAPILLARY
GLUCOSE-CAPILLARY: 73 mg/dL (ref 65–99)
GLUCOSE-CAPILLARY: 104 mg/dL — AB (ref 65–99)
Glucose-Capillary: 92 mg/dL (ref 65–99)
Glucose-Capillary: 127 mg/dL — ABNORMAL HIGH (ref 65–99)

## 2017-04-14 LAB — COOXEMETRY PANEL
Carboxyhemoglobin: 1.3 % (ref 0.5–1.5)
Methemoglobin: 1.1 % (ref 0.0–1.5)
O2 Saturation: 69.3 %
Total hemoglobin: 8 g/dL — ABNORMAL LOW (ref 12.0–16.0)

## 2017-04-14 MED ORDER — SODIUM CHLORIDE 0.9 % IV SOLN: INTRAVENOUS | Status: DC

## 2017-04-14 MED ORDER — ASPIRIN 81 MG PO CHEW
81.0000 mg | CHEWABLE_TABLET | ORAL | Status: AC
  Administered 2017-04-15: 81 mg via ORAL
  Filled 2017-04-14 (×2): qty 1

## 2017-04-14 MED ORDER — SODIUM CHLORIDE 0.9% FLUSH
3.0000 mL | Freq: Two times a day (BID) | INTRAVENOUS | Status: DC
  Administered 2017-04-14: 3 mL via INTRAVENOUS

## 2017-04-14 MED ORDER — FERUMOXYTOL INJECTION 510 MG/17 ML
510.0000 mg | INTRAVENOUS | Status: AC
  Administered 2017-04-14 – 2017-04-21 (×2): 510 mg via INTRAVENOUS
  Filled 2017-04-14 (×3): qty 17

## 2017-04-14 MED ORDER — METOLAZONE 5 MG PO TABS
2.5000 mg | ORAL_TABLET | Freq: Once | ORAL | Status: AC
  Administered 2017-04-14: 2.5 mg via ORAL
  Filled 2017-04-14: qty 1

## 2017-04-14 MED ORDER — SODIUM CHLORIDE 0.9% FLUSH: 3.0000 mL | INTRAVENOUS | Status: DC | PRN

## 2017-04-14 MED ORDER — MILRINONE LACTATE IN DEXTROSE 20-5 MG/100ML-% IV SOLN
0.2500 ug/kg/min | INTRAVENOUS | Status: DC
  Administered 2017-04-14 – 2017-04-16 (×4): 0.25 ug/kg/min via INTRAVENOUS
  Filled 2017-04-14 (×6): qty 100

## 2017-04-14 MED ORDER — SODIUM CHLORIDE 0.9 % IV SOLN: 250.0000 mL | INTRAVENOUS | Status: DC | PRN

## 2017-04-14 NOTE — Progress Notes (Signed)
Patient ID: James [name], male   DOB: [birth year], 56 y.o.   MRN: [id number]    SUBJECTIVE: Good diuresis yesterday.  Weight down another 9 lbs, creatinine stable at 1.56.  Co-ox 69%, CVP improved at 13.     Hemoglobin stably low.  He denies BRBPR/melena.  FOBT negative.   Echo (6/18): EF 15%, mildly dilated/moderately reduced function RV.   RHC Procedural Findings: Hemodynamics (mmHg) RA mean 20 RV 71/22 PA 70/31 mean 45 PCWP mean 24 CVP/PCWP 0.83 PAPi 1.95 Oxygen saturations: PA 49% AO 100% Cardiac Output (Fick) 4.95  Cardiac Index (Fick) 2.28 PVR 4.24 WU Cardiac Output (Thermo) 5.74 Cardiac Index (Fick) 2.65 PVR 3.65 WU  Scheduled Meds: . amiodarone  400 mg Oral BID  . aspirin EC  81 mg Oral Daily  . atorvastatin  80 mg Oral q1800  . carvedilol  6.25 mg Oral BID WC  . Chlorhexidine Gluconate Cloth  6 each Topical Q0600  . colchicine  0.6 mg Oral Daily  . digoxin  0.0625 mg Oral Daily  . febuxostat  120 mg Oral Daily  . ferrous fumarate-b12-vitamic C-folic acid  1 capsule Oral TID PC  . glipiZIDE  5 mg Oral BID AC  . heparin  5,000 Units Subcutaneous Q8H  . Melatonin  3 mg Oral QHS  . metolazone  2.5 mg Oral Once  . mupirocin ointment  1 application Nasal BID  . potassium chloride  40 mEq Oral BID  . sodium chloride flush  10-40 mL Intracatheter Q12H  . sodium chloride flush  3 mL Intravenous Q12H   Continuous Infusions: . sodium chloride    . sodium chloride 3 mL/hr at 04/12/17 2000  . furosemide (LASIX) infusion 15 mg/hr (04/14/17 0324)  . milrinone 0.25 mcg/kg/min (04/14/17 0323)   PRN Meds:.sodium chloride, acetaminophen, ondansetron (ZOFRAN) IV, sodium chloride flush, sodium chloride flush    Vitals:   04/13/17 2101 04/14/17 0000 04/14/17 0545 04/14/17 0736  BP: 111/74 115/71 118/79 122/85  Pulse: 76  73 72  Resp: (!) 21  18 11   Temp: 98 F (36.7 C) 98.3 F (36.8 C) 98 F (36.7 C) 98 F (36.7 C)  TempSrc:  Oral Oral Oral  SpO2: 100%  100% 99%    Weight:   197 lb 12.8 oz (89.7 kg)   Height:        Intake/Output Summary (Last 24 hours) at 04/14/17 0754 Last data filed at 04/14/17 0700  Gross per 24 hour  Intake           810.05 ml  Output             5705 ml  Net         -4894.95 ml    LABS: Basic Metabolic Panel:  Recent Labs  40/07/2705/24/18 0606 04/14/17 0611  NA 136 136  K 3.9 4.2  CL 101 100*  CO2 26 28  GLUCOSE 84 83  BUN 33* 32*  CREATININE 1.53* 1.56*  CALCIUM 7.8* 8.2*   Liver Function Tests: No results for input(s): AST, ALT, ALKPHOS, BILITOT, PROT, ALBUMIN in the last 72 hours. No results for input(s): LIPASE, AMYLASE in the last 72 hours. CBC:  Recent Labs  04/13/17 0606 04/14/17 0611  WBC 3.7* 5.7  HGB 8.4* 7.8*  HCT 27.4* 24.8*  MCV 85.4 84.9  PLT 287 322   Cardiac Enzymes: No results for input(s): CKTOTAL, CKMB, CKMBINDEX, TROPONINI in the last 72 hours. BNP: Invalid input(s): POCBNP D-Dimer: No results for input(s): DDIMER in  the last 72 hours. Hemoglobin A1C: No results for input(s): HGBA1C in the last 72 hours. Fasting Lipid Panel:  Recent Labs  04/12/17 0527  CHOL 79  HDL 23*  LDLCALC 43  TRIG 64  CHOLHDL 3.4   Thyroid Function Tests:  Recent Labs  04/12/17 0527  TSH 4.928*   Anemia Panel:  Recent Labs  04/12/17 0800  VITAMINB12 585  FERRITIN 264  TIBC 162*  IRON 28*    RADIOLOGY: Ct Abdomen Pelvis Wo Contrast  Result Date: 04/12/2017 CLINICAL DATA:  Patient has chronic systolic CHF, being considered for LVAD EXAM: CT CHEST, ABDOMEN AND PELVIS WITHOUT CONTRAST TECHNIQUE: Multidetector CT imaging of the chest, abdomen and pelvis was performed following the standard protocol without IV contrast. COMPARISON:  None. FINDINGS: CT CHEST FINDINGS Cardiovascular: Thoracic aorta is normal in caliber and configuration. Scattered aortic atherosclerosis. Cardiomegaly. No pericardial effusion. Diffuse coronary artery calcifications. Status post CABG. Mediastinum/Nodes: No mass  or lymphadenopathy. Esophagus is unremarkable. Trachea and central bronchi are unremarkable. Lungs/Pleura: Mild atelectasis/scarring and mild pleural thickening at each lung base. Lungs otherwise clear. Incidentally noted calcified granuloma at the right lung apex. Musculoskeletal: Mild degenerative spurring in the lower thoracic spine. Degenerative changes at the shoulders. No acute or suspicious osseous finding. Status post median sternotomy. Edema within the subcutaneous soft tissues suggesting anasarca. Bilateral gynecomastia. CT ABDOMEN PELVIS FINDINGS Hepatobiliary: Stones within the nondistended gallbladder. No focal liver abnormality is seen. No bile duct dilatation seen. Pancreas: Partially obscured.  Visualized portions unremarkable. Spleen: Normal in size without focal abnormality. Adrenals/Urinary Tract: Beam hardening artifact obscures portions of each kidney probable renal cysts bilaterally, difficult to definitively evaluate with Hounsfield unit measurement due to the beam hardening artifact. No renal stone or hydronephrosis seen. Bladder is unremarkable. Stomach/Bowel: Bowel is normal in caliber. No bowel wall thickening or evidence of bowel wall inflammation seen. Some portions of the bowel are obscured by the aforementioned beam hardening artifact. Vascular/Lymphatic: Aortic atherosclerosis. No enlarged lymph nodes seen. Reproductive: Unremarkable. Other: No free fluid or abscess collections seen. No free intraperitoneal air. Elevation of the left hemidiaphragm, with upper stomach located posterior to the left ventricle. Musculoskeletal: Degenerative changes of the thoracolumbar spine, moderate in degree. No acute or suspicious osseous finding. Ill-defined edema within the subcutaneous soft tissues of the abdomen and pelvis indicating anasarca. IMPRESSION: 1. No acute findings within the chest, abdomen or pelvis. 2. No anatomic variants identified. There is elevation of the left hemidiaphragm with  stomach positioned posterior to the left ventricle. 3. Cardiomegaly. Diffuse coronary artery calcifications. No pericardial effusion seen. 4. Cholelithiasis without evidence of acute cholecystitis. 5. Aortic atherosclerosis. 6. Anasarca. 7. Mild study limitations detailed above. Electronically Signed   By: Bary Richard M.D.   On: 04/12/2017 17:30   Ct Chest Wo Contrast  Result Date: 04/12/2017 CLINICAL DATA:  Patient has chronic systolic CHF, being considered for LVAD EXAM: CT CHEST, ABDOMEN AND PELVIS WITHOUT CONTRAST TECHNIQUE: Multidetector CT imaging of the chest, abdomen and pelvis was performed following the standard protocol without IV contrast. COMPARISON:  None. FINDINGS: CT CHEST FINDINGS Cardiovascular: Thoracic aorta is normal in caliber and configuration. Scattered aortic atherosclerosis. Cardiomegaly. No pericardial effusion. Diffuse coronary artery calcifications. Status post CABG. Mediastinum/Nodes: No mass or lymphadenopathy. Esophagus is unremarkable. Trachea and central bronchi are unremarkable. Lungs/Pleura: Mild atelectasis/scarring and mild pleural thickening at each lung base. Lungs otherwise clear. Incidentally noted calcified granuloma at the right lung apex. Musculoskeletal: Mild degenerative spurring in the lower thoracic spine. Degenerative changes at  the shoulders. No acute or suspicious osseous finding. Status post median sternotomy. Edema within the subcutaneous soft tissues suggesting anasarca. Bilateral gynecomastia. CT ABDOMEN PELVIS FINDINGS Hepatobiliary: Stones within the nondistended gallbladder. No focal liver abnormality is seen. No bile duct dilatation seen. Pancreas: Partially obscured.  Visualized portions unremarkable. Spleen: Normal in size without focal abnormality. Adrenals/Urinary Tract: Beam hardening artifact obscures portions of each kidney probable renal cysts bilaterally, difficult to definitively evaluate with Hounsfield unit measurement due to the beam  hardening artifact. No renal stone or hydronephrosis seen. Bladder is unremarkable. Stomach/Bowel: Bowel is normal in caliber. No bowel wall thickening or evidence of bowel wall inflammation seen. Some portions of the bowel are obscured by the aforementioned beam hardening artifact. Vascular/Lymphatic: Aortic atherosclerosis. No enlarged lymph nodes seen. Reproductive: Unremarkable. Other: No free fluid or abscess collections seen. No free intraperitoneal air. Elevation of the left hemidiaphragm, with upper stomach located posterior to the left ventricle. Musculoskeletal: Degenerative changes of the thoracolumbar spine, moderate in degree. No acute or suspicious osseous finding. Ill-defined edema within the subcutaneous soft tissues of the abdomen and pelvis indicating anasarca. IMPRESSION: 1. No acute findings within the chest, abdomen or pelvis. 2. No anatomic variants identified. There is elevation of the left hemidiaphragm with stomach positioned posterior to the left ventricle. 3. Cardiomegaly. Diffuse coronary artery calcifications. No pericardial effusion seen. 4. Cholelithiasis without evidence of acute cholecystitis. 5. Aortic atherosclerosis. 6. Anasarca. 7. Mild study limitations detailed above. Electronically Signed   By: Bary Richard M.D.   On: 04/12/2017 17:30    PHYSICAL EXAM General: NAD Neck: JVP 12-14 cm, no thyromegaly or thyroid nodule.  Lungs: Clear to auscultation bilaterally with normal respiratory effort. CV: Lateral PMI.  Heart regular S1/S2, no S3/S4, no murmur.  1+ ankle edema.   Abdomen: Soft, nontender, no hepatosplenomegaly, no distention.  Neurologic: Alert and oriented x 3.  Psych: Normal affect. Extremities: No clubbing or cyanosis.   TELEMETRY: Personally reviewed telemetry pt in NSR   ASSESSMENT AND PLAN: 56 yo with CAD s/p CABG, ischemic cardiomyopathy/chronic systolic CHF, tophaceous gout, and CKD stage 3 was admitted for diuresis and consideration for LVAD  placement.  1. Acute/chronic systolic CHF: Ischemic cardiomyopathy.  St Jude ICD.  Echo (6/18) with EF 15%, mildly dilated RV with moderately decreased systolic function.  RHC yesterday with elevated filling pressures and evidence of RV failure though PAPi score 1.95 so not prohibitive.  He has been seen at [facility] and turned down for transplant with severe tophaceous gout and concern for worsening on transplant meds.  He is on milrinone gtt + Lasix gtt currently, good urine output yesterday with creatinine stable at 1.56.  Co-ox 69%.  CVP 13, improved from yesterday.   - Continue milrinone 0.25.  - Continue Lasix gtt 15 mg/hr and replace K.  Will add dose of metolazone 2.5 x 1.  - Continue lower dose Coreg 6.25 mg bid.  - Continue LVAD workup, will discuss at [facility] today.  Will repeat RHC tomorrow to reassess on milrinone and after diuresis.  Discussed risks/benefits with patient and he agrees to procedure.  2. CKD stage 3: Creatinine improved somewhat on milrinone and with diuresis.  Continue to watch closely.  3. Anemia: Hgb 7.8, has been fairly stably low.  Patient denies signs of overt GI bleeding and FOBT negative.  May be component of anemia of renal disease/chronic disease.  Fe studies do not show marked Fe deficiency.  Seen by GI, they do not feel that he needs  endoscopy.  4. CAD: s/p CABG 2012.  Stable, no chest pain.  5. Gout: Severe tophaceous gout.  On Uloric, will continue colchicine daily.  No pain currently.  6. Arrhythmias: PVCs, short runs AT noted on milrinone.  Quiescent on amiodarone.   Marca Ancona 04/14/2017 7:54 AM

## 2017-04-14 NOTE — Progress Notes (Signed)
Daily Rounding Note  04/14/2017, 9:53 AM  LOS: 3 days   SUBJECTIVE:   Chief complaint: none Breathing is better  OBJECTIVE:         Vital signs in last 24 hours:    Temp:  [98 F (36.7 C)-98.3 F (36.8 C)] 98 F (36.7 C) (06/25 0736) Pulse Rate:  [72-76] 74 (06/25 0842) Resp:  [11-21] 16 (06/25 0750) BP: (111-122)/(71-85) 122/85 (06/25 0736) SpO2:  [99 %-100 %] 99 % (06/25 0736) Weight:  [89.7 kg (197 lb 12.8 oz)] 89.7 kg (197 lb 12.8 oz) (06/25 0545) Last BM Date: 04/13/17 Filed Weights   04/12/17 0453 04/13/17 0537 04/14/17 0545  Weight: 97.8 kg (215 lb 8 oz) 93.6 kg (206 lb 6.4 oz) 89.7 kg (197 lb 12.8 oz)   General: looks well, comfortable   Heart: RRR Chest: clear bil.  No labored breathing Abdomen: soft, active BS.  NT, ND  Extremities: minor pitting edema on feet Neuro/Psych:  Oriented x 3.  No weakness, no tremor.  Calm, cooperative.   Intake/Output from previous day: 06/24 0701 - 06/25 0700 In: 810.1 [P.O.:360; I.V.:450.1] Out: 5705 [Urine:5705]  Intake/Output this shift: Total I/O In: 485.5 [P.O.:360; I.V.:125.5] Out: 380 [Urine:380]  Lab Results:  Recent Labs  04/12/17 0527 04/13/17 0606 04/14/17 0611  WBC 4.3 3.7* 5.7  HGB 7.1* 8.4* 7.8*  HCT 22.6* 27.4* 24.8*  PLT 308 287 322   BMET  Recent Labs  04/12/17 0527 04/13/17 0606 04/14/17 0611  NA 136 136 136  K 3.4* 3.9 4.2  CL 103 101 100*  CO2 22 26 28   GLUCOSE 71 84 83  BUN 35* 33* 32*  CREATININE 1.58* 1.53* 1.56*  CALCIUM 7.4* 7.8* 8.2*   LFT No results for input(s): PROT, ALBUMIN, AST, ALT, ALKPHOS, BILITOT, BILIDIR, IBILI in the last 72 hours. PT/INR  Recent Labs  03/29/2017 1027  LABPROT 16.1*  INR 1.28   Hepatitis Panel No results for input(s): HEPBSAG, HCVAB, HEPAIGM, HEPBIGM in the last 72 hours.  Studies/Results: Ct Abdomen Pelvis Wo Contrast  Result Date: 04/12/2017 CLINICAL DATA:  Patient has chronic  systolic CHF, being considered for LVAD EXAM: CT CHEST, ABDOMEN AND PELVIS WITHOUT CONTRAST TECHNIQUE: Multidetector CT imaging of the chest, abdomen and pelvis was performed following the standard protocol without IV contrast. COMPARISON:  None. FINDINGS: CT CHEST FINDINGS Cardiovascular: Thoracic aorta is normal in caliber and configuration. Scattered aortic atherosclerosis. Cardiomegaly. No pericardial effusion. Diffuse coronary artery calcifications. Status post CABG. Mediastinum/Nodes: No mass or lymphadenopathy. Esophagus is unremarkable. Trachea and central bronchi are unremarkable. Lungs/Pleura: Mild atelectasis/scarring and mild pleural thickening at each lung base. Lungs otherwise clear. Incidentally noted calcified granuloma at the right lung apex. Musculoskeletal: Mild degenerative spurring in the lower thoracic spine. Degenerative changes at the shoulders. No acute or suspicious osseous finding. Status post median sternotomy. Edema within the subcutaneous soft tissues suggesting anasarca. Bilateral gynecomastia. CT ABDOMEN PELVIS FINDINGS Hepatobiliary: Stones within the nondistended gallbladder. No focal liver abnormality is seen. No bile duct dilatation seen. Pancreas: Partially obscured.  Visualized portions unremarkable. Spleen: Normal in size without focal abnormality. Adrenals/Urinary Tract: Beam hardening artifact obscures portions of each kidney probable renal cysts bilaterally, difficult to definitively evaluate with Hounsfield unit measurement due to the beam hardening artifact. No renal stone or hydronephrosis seen. Bladder is unremarkable. Stomach/Bowel: Bowel is normal in caliber. No bowel wall thickening or evidence of bowel wall inflammation seen. Some portions of the bowel are obscured  by the aforementioned beam hardening artifact. Vascular/Lymphatic: Aortic atherosclerosis. No enlarged lymph nodes seen. Reproductive: Unremarkable. Other: No free fluid or abscess collections seen. No  free intraperitoneal air. Elevation of the left hemidiaphragm, with upper stomach located posterior to the left ventricle. Musculoskeletal: Degenerative changes of the thoracolumbar spine, moderate in degree. No acute or suspicious osseous finding. Ill-defined edema within the subcutaneous soft tissues of the abdomen and pelvis indicating anasarca. IMPRESSION: 1. No acute findings within the chest, abdomen or pelvis. 2. No anatomic variants identified. There is elevation of the left hemidiaphragm with stomach positioned posterior to the left ventricle. 3. Cardiomegaly. Diffuse coronary artery calcifications. No pericardial effusion seen. 4. Cholelithiasis without evidence of acute cholecystitis. 5. Aortic atherosclerosis. 6. Anasarca. 7. Mild study limitations detailed above. Electronically Signed   By: Bary Richard M.D.   On: 04/12/2017 17:30   Ct Chest Wo Contrast  Result Date: 04/12/2017 CLINICAL DATA:  Patient has chronic systolic CHF, being considered for LVAD EXAM: CT CHEST, ABDOMEN AND PELVIS WITHOUT CONTRAST TECHNIQUE: Multidetector CT imaging of the chest, abdomen and pelvis was performed following the standard protocol without IV contrast. COMPARISON:  None. FINDINGS: CT CHEST FINDINGS Cardiovascular: Thoracic aorta is normal in caliber and configuration. Scattered aortic atherosclerosis. Cardiomegaly. No pericardial effusion. Diffuse coronary artery calcifications. Status post CABG. Mediastinum/Nodes: No mass or lymphadenopathy. Esophagus is unremarkable. Trachea and central bronchi are unremarkable. Lungs/Pleura: Mild atelectasis/scarring and mild pleural thickening at each lung base. Lungs otherwise clear. Incidentally noted calcified granuloma at the right lung apex. Musculoskeletal: Mild degenerative spurring in the lower thoracic spine. Degenerative changes at the shoulders. No acute or suspicious osseous finding. Status post median sternotomy. Edema within the subcutaneous soft tissues  suggesting anasarca. Bilateral gynecomastia. CT ABDOMEN PELVIS FINDINGS Hepatobiliary: Stones within the nondistended gallbladder. No focal liver abnormality is seen. No bile duct dilatation seen. Pancreas: Partially obscured.  Visualized portions unremarkable. Spleen: Normal in size without focal abnormality. Adrenals/Urinary Tract: Beam hardening artifact obscures portions of each kidney probable renal cysts bilaterally, difficult to definitively evaluate with Hounsfield unit measurement due to the beam hardening artifact. No renal stone or hydronephrosis seen. Bladder is unremarkable. Stomach/Bowel: Bowel is normal in caliber. No bowel wall thickening or evidence of bowel wall inflammation seen. Some portions of the bowel are obscured by the aforementioned beam hardening artifact. Vascular/Lymphatic: Aortic atherosclerosis. No enlarged lymph nodes seen. Reproductive: Unremarkable. Other: No free fluid or abscess collections seen. No free intraperitoneal air. Elevation of the left hemidiaphragm, with upper stomach located posterior to the left ventricle. Musculoskeletal: Degenerative changes of the thoracolumbar spine, moderate in degree. No acute or suspicious osseous finding. Ill-defined edema within the subcutaneous soft tissues of the abdomen and pelvis indicating anasarca. IMPRESSION: 1. No acute findings within the chest, abdomen or pelvis. 2. No anatomic variants identified. There is elevation of the left hemidiaphragm with stomach positioned posterior to the left ventricle. 3. Cardiomegaly. Diffuse coronary artery calcifications. No pericardial effusion seen. 4. Cholelithiasis without evidence of acute cholecystitis. 5. Aortic atherosclerosis. 6. Anasarca. 7. Mild study limitations detailed above. Electronically Signed   By: Bary Richard M.D.   On: 04/12/2017 17:30    ASSESMENT:   *  Normocytic anemia, FOBT negative, in pt with CKD stage 3.  Not iron deficient.  Suspect multifactorial cause.  Never  had EGD or colonoscopy.   Hgb stable, no transfusions to date.   *  Acute on chronic CHF, being considered for LVAD.   6V CABG 2012. LVAD 2016.  On  Milrinone.         PLAN   *  Decision re colonoscopy per Dr Marina GoodellPerry.  Pt has never had either.   Jennye MoccasinSarah Gribbin  04/14/2017, 9:53 AM Pager: [phone]  GI ATTENDING  Case discussed with Dr. Russella DarStark. Interval history and data reviewed. Cardiology note reviewed. Plans for right heart cath tomorrow noted. Agree with above interval GI progress note. Given patient's overall severe medical problems and his non-transplant candidacy would not offer screening colonoscopy. Normocytic anemia felt to be secondary to chronic disease. No evidence for GI bleeding. Please contact GI if there are any questions or relevant new problems arise. Will sign off.  Wilhemina BonitoJohn N. Eda KeysPerry, Jr., M.D. [facility] Division of Gastroenterology

## 2017-04-14 NOTE — Progress Notes (Signed)
  Echocardiogram 2D Echocardiogram has been performed.  James [name] 04/14/2017, 3:57 PM

## 2017-04-14 NOTE — Progress Notes (Signed)
CARDIAC REHAB PHASE I   PRE:  Rate/Rhythm: 74 SR  BP:  Sitting: 115/78        SaO2: 100 RA  MODE:  Ambulation: 432 ft in 6 minutes   POST:  Rate/Rhythm: 94 SR  BP:  Sitting: 119/82         SaO2: 98 RA  Pt in bed, states he has been walking twice a day. 6 minute walk test completed. Pt ambulated 432 ft in 6 minutes on RA, IV, assist x1, slow, steady gait, tolerated well with no complaints. Pt to edge of bed per pt request after walk, call bell within reach. Encouraged ambulation as tolerated. Will follow.   [phone] Joylene GrapesEmily C Areta Terwilliger, RN, BSN 04/14/2017 10:51 AM

## 2017-04-14 NOTE — Consult Note (Signed)
Consultation Note Date: 04/14/2017   Patient Name: James [name]  DOB: Nov 06, 1960  MRN: [id number]  Age / Sex: 56 y.o., male  PCP: Joaquin CourtsFavero, John Patrick, DO Referring Physician: Laurey MoraleMcLean, Dalton S, MD  Reason for Consultation: LVAD evaluation  HPI/Patient Profile: 56 y.o. male  admitted on 03/24/2017 with significant PMH for gout, DMII, HTN, CKD, CAD S/P CABGx6 2012, chronic systolic heart failure and [facility] ICD.   Evaluated April of the year for transplant but was not felt to be candidate due to mobility limitations with gout.   Followed by Dr Shirlee LatchMcLean in  HF clinic,  creatinine was trending up so he was set up for RHC and optimization for possible VAD.   [facility]  Admitted and steps moving forward for LVAD implantation in process.  Clinical Assessment and Goals of Care:  This NP Lorinda CreedMary Excell Neyland reviewed medical records, received report from team, assessed the patient and then meet at the bedside with his wife James [name] to discuss advanced directives and a preparedness plan in light of scheduled LVAD implantation tomorrow morning.  This is destination therapy.  A  discussion was had today regarding the concept of a preparedness plan as it relates to LVAD implant therapy.   Patient and family were comfortable talking about the "what ifs" and the importance of today's conversation so everyone can have all the information to be full participants and to understand the patient's basic beliefs and wishes as it relates  to healthcare and intention of LVAD  Concepts specific to future possibilities of -long term ventilation -artificial feeding and hydration -psychological adjustments  Patient was able to verbalize to his family the importance of quality of life.  Intention of LVAD for  James [name] to regain his quality of life, and possible allow for better control of his underlying gout diagnosis.  He and  his wife understand the risks and benefits of the procedure as it relates to his future.  At this time patient is open to all available medical interventions to prolong life and the success of the LVAD therapy. He shared and verbalized an understanding that his wife  would know when the burdens of treatment would outweigh the benefits and trust in her support at that time.  A MOST form was introduced  Patient and family were encouraged to continue conversation into the future as it is vital for the patient centered care.  Chaplain services requested, consult placed  Request completion of HPOA and AD.  James [name] is "ready" to move forward with the LVAD procedure.  He is willing to do "whatever I have to do" for successful outcomes.  His wife is supportive.  PMT will be available and supportive throughout course of this hospitalization.       Primary Diagnoses: Present on Admission: . Acute on chronic systolic CHF (congestive heart failure) (HCC) . Acute on chronic systolic heart failure, NYHA class 3 (HCC)   I have reviewed the medical record, interviewed the patient and family, and examined the patient.  The following aspects are pertinent.  Past Medical History:  Diagnosis Date  . AICD (automatic cardioverter/defibrillator) present 09/2015   [facility] VR model [id number] (serial Number [id number]) ICD   . CHF (congestive heart failure) (HCC)   . Chronic edema   . CMI (chronic mesenteric ischemia) (HCC)   . Coronary artery disease   . Dyspnea   . Gout   . Gout   . Hypertension   . Ischemic cardiomyopathy    EF 25%  . Myocardial infarction [facility])    "they saw I'd had one; not sure when but it was before 2012"  . Obesity   . Renal insufficiency   . Type II diabetes mellitus (HCC)    Social History   Social History  . Marital status: Married    Spouse name: N/A  . Number of children: N/A  . Years of education: N/A   Occupational History  . Truck Emergency planning/management officer   Social History Main Topics  . Smoking status: Never Smoker  . Smokeless tobacco: Former Neurosurgeon    Types: Chew     Comment: "randomly chewed in my 20's"  . Alcohol use 0.0 oz/week     Comment: 10/06/2015 "might have a beer q couple weeks"  . Drug use: No  . Sexual activity: Not Currently   Other Topics Concern  . None   Social History Narrative   Pt lives in [place] with spouse.   Worked as a Solicitor, though now on disability   Family History  Problem Relation Age of Onset  . Hypertension Mother   . Diabetes type II Mother   . Hypertension Father   . Hypertension Sister   . Hypertension Paternal Grandmother   . Diabetes Other   . Hypertension Other    Scheduled Meds: . amiodarone  400 mg Oral BID  . [START ON 04/13/2017] aspirin  81 mg Oral Pre-Cath  . aspirin EC  81 mg Oral Daily  . atorvastatin  80 mg Oral q1800  . carvedilol  6.25 mg Oral BID WC  . Chlorhexidine Gluconate Cloth  6 each Topical Q0600  . colchicine  0.6 mg Oral Daily  . digoxin  0.0625 mg Oral Daily  . febuxostat  120 mg Oral Daily  . glipiZIDE  5 mg Oral BID AC  . heparin  5,000 Units Subcutaneous Q8H  . Melatonin  3 mg Oral QHS  . mupirocin ointment  1 application Nasal BID  . potassium chloride  40 mEq Oral BID  . sodium chloride flush  10-40 mL Intracatheter Q12H  . sodium chloride flush  3 mL Intravenous Q12H  . sodium chloride flush  3 mL Intravenous Q12H   Continuous Infusions: . sodium chloride    . sodium chloride 3 mL/hr at 04/12/17 2000  . sodium chloride    . [START ON 04/02/2017] sodium chloride    . ferumoxytol 510 mg (04/14/17 1221)  . furosemide (LASIX) infusion 15 mg/hr (04/14/17 0324)  . milrinone 0.25 mcg/kg/min (04/14/17 1043)   PRN Meds:.sodium chloride, sodium chloride, acetaminophen, ondansetron (ZOFRAN) IV, sodium chloride flush, sodium chloride flush, sodium chloride flush Medications Prior to Admission:  Prior to  Admission medications   Medication Sig Start Date End Date Taking? Authorizing Provider  acetaminophen (TYLENOL) 500 MG tablet Take 500-1,000 mg by mouth every 4 (four) hours as needed for headache (pain).   Yes [provider]  aspirin EC 81 MG tablet Take  81 mg by mouth daily.   Yes [provider]  atorvastatin (LIPITOR) 80 MG tablet TAKE ONE TABLET BY MOUTH DAILY 09/20/16  Yes Laurey Morale, MD  carvedilol (COREG) 25 MG tablet Take 0.5 tablets (12.5 mg total) by mouth 2 (two) times daily with a meal. Please schedule appointment for refills. 04/08/17  Yes Laurey Morale, MD  Colchicine (MITIGARE) 0.6 MG CAPS Take 1 capsule by mouth 2 (two) times daily.   Yes [provider]  colchicine 0.6 MG tablet Take 1 tablet (0.6 mg total) by mouth daily. Patient taking differently: Take 0.6 mg by mouth daily as needed (gout).  04/08/17  Yes Laurey Morale, MD  digoxin (LANOXIN) 0.125 MG tablet Take 0.5 tablets (0.0625 mg total) by mouth daily. 02/20/17  Yes Laurey Morale, MD  febuxostat (ULORIC) 40 MG tablet Take 120 mg by mouth daily.   Yes [provider]  glipiZIDE (GLUCOTROL) 5 MG tablet TAKE 1 TABLET (5 MG TOTAL) BY MOUTH 2 (TWO) TIMES DAILY BEFORE MEALS (REFER FUTURE REFILLS TO PRIMARY CARE PHYSICIAN) 04/24/16  Yes Hochrein, Fayrene Fearing, MD  isosorbide-hydrALAZINE (BIDIL) 20-37.5 MG tablet Take 1.5 tablets by mouth 3 (three) times daily. 03/24/17  Yes Laurey Morale, MD  nitroGLYCERIN (NITROSTAT) 0.4 MG SL tablet Place 1 tablet (0.4 mg total) under the tongue every 5 (five) minutes as needed for chest pain. 02/18/17 05/07/20 Yes Laurey Morale, MD  torsemide (DEMADEX) 20 MG tablet Take 2 tablets (40 mg total) by mouth daily. 04/08/17  Yes Laurey Morale, MD   Allergies  Allergen Reactions  . Spironolactone Other (See Comments)    Increases potassium levels   Review of Systems  Constitutional: Positive for fatigue.    Physical Exam  Constitutional: He is  oriented to person, place, and time. He appears cachectic. He appears ill.  Pulmonary/Chest: Effort normal.  Musculoskeletal:  - gross deformities both hands, several fingers 2/2 gout  Neurological: He is alert and oriented to person, place, and time.  Skin: Skin is warm and dry.    Vital Signs: BP 122/85 (BP Location: Left Arm)   Pulse 74   Temp 98 F (36.7 C) (Oral)   Resp 16   Ht 6' (1.829 m)   Wt 89.7 kg (197 lb 12.8 oz)   SpO2 99%   BMI 26.83 kg/m  Pain Assessment: No/denies pain POSS *See Group Information*: 1-Acceptable,Awake and alert Pain Score: 0-No pain   SpO2: SpO2: 99 % O2 Device:SpO2: 99 % O2 Flow Rate: .O2 Flow Rate (L/min): 0 L/min  IO: Intake/output summary:  Intake/Output Summary (Last 24 hours) at 04/14/17 1236 Last data filed at 04/14/17 0900  Gross per 24 hour  Intake            956.8 ml  Output             5135 ml  Net          -4178.2 ml    LBM: Last BM Date: 04/13/17 Baseline Weight: Weight: 95.3 kg (210 lb) Most recent weight: Weight: 89.7 kg (197 lb 12.8 oz)     Palliative Assessment/Data:   Flowsheet Rows     Most Recent Value  Intake Tab  Referral Department  Cardiology  Unit at Time of Referral  Cardiac/Telemetry Unit  Palliative Care Primary Diagnosis  Cardiac  Date Notified  04/14/17  Palliative Care Type  New Palliative care  Reason for referral  Other (Comment) [VAD Eval]  Date of Admission  04/20/2017  #  of days IP prior to Palliative referral  3  Clinical Assessment  Psychosocial & Spiritual Assessment  Palliative Care Outcomes      Time In: 1415 Time Out: 1515 Time Total: 60 min Greater than 50%  of this time was spent counseling and coordinating care related to the above assessment and plan.  Signed by: Lorinda Creed, NP   Please contact Palliative Medicine Team phone at [phone] for questions and concerns.  For individual provider: See Loretha Stapler

## 2017-04-15 ENCOUNTER — Encounter (HOSPITAL_COMMUNITY): Admission: AD | Disposition: E | Payer: Self-pay | Source: Ambulatory Visit | Attending: Surgery

## 2017-04-15 ENCOUNTER — Encounter (HOSPITAL_COMMUNITY): Payer: Self-pay | Admitting: Cardiology

## 2017-04-15 DIAGNOSIS — I5023 Acute on chronic systolic (congestive) heart failure: Secondary | ICD-10-CM

## 2017-04-15 HISTORY — PX: RIGHT HEART CATH: CATH118263

## 2017-04-15 LAB — POCT I-STAT 3, VENOUS BLOOD GAS (G3P V)
ACID-BASE EXCESS: 9 mmol/L — AB (ref 0.0–2.0)
Acid-Base Excess: 8 mmol/L — ABNORMAL HIGH (ref 0.0–2.0)
BICARBONATE: 32.9 mmol/L — AB (ref 20.0–28.0)
BICARBONATE: 33.6 mmol/L — AB (ref 20.0–28.0)
O2 SAT: 61 %
O2 SAT: 62 %
PCO2 VEN: 47.7 mmHg (ref 44.0–60.0)
PCO2 VEN: 49 mmHg (ref 44.0–60.0)
PO2 VEN: 31 mmHg — AB (ref 32.0–45.0)
TCO2: 34 mmol/L (ref 0–100)
TCO2: 35 mmol/L (ref 0–100)
pH, Ven: 7.444 — ABNORMAL HIGH (ref 7.250–7.430)
pH, Ven: 7.446 — ABNORMAL HIGH (ref 7.250–7.430)
pO2, Ven: 31 mmHg — CL (ref 32.0–45.0)

## 2017-04-15 LAB — BASIC METABOLIC PANEL
ANION GAP: 10 (ref 5–15)
BUN: 29 mg/dL — ABNORMAL HIGH (ref 6–20)
CALCIUM: 8.3 mg/dL — AB (ref 8.9–10.3)
CO2: 29 mmol/L (ref 22–32)
CREATININE: 1.62 mg/dL — AB (ref 0.61–1.24)
Chloride: 96 mmol/L — ABNORMAL LOW (ref 101–111)
GFR, EST AFRICAN AMERICAN: 54 mL/min — AB (ref >60)
GFR, EST NON AFRICAN AMERICAN: 47 mL/min — AB (ref >60)
Glucose, Bld: 72 mg/dL (ref 65–99)
Potassium: 4.3 mmol/L (ref 3.5–5.1)
SODIUM: 135 mmol/L (ref 135–145)

## 2017-04-15 LAB — GLUCOSE, CAPILLARY
Glucose-Capillary: 58 mg/dL — ABNORMAL LOW (ref 65–99)
Glucose-Capillary: 102 mg/dL — ABNORMAL HIGH (ref 65–99)
Glucose-Capillary: 142 mg/dL — ABNORMAL HIGH (ref 65–99)

## 2017-04-15 LAB — COOXEMETRY PANEL
CARBOXYHEMOGLOBIN: 1.4 % (ref 0.5–1.5)
METHEMOGLOBIN: 1.3 % (ref 0.0–1.5)
O2 SAT: 70.1 %
TOTAL HEMOGLOBIN: 8.5 g/dL — AB (ref 12.0–16.0)

## 2017-04-15 LAB — DIGOXIN LEVEL: DIGOXIN LVL: 0.6 ng/mL — AB (ref 0.8–2.0)

## 2017-04-15 LAB — CBC
HCT: 28.2 % — ABNORMAL LOW (ref 39.0–52.0)
HEMOGLOBIN: 8.9 g/dL — AB (ref 13.0–17.0)
MCH: 26.7 pg (ref 26.0–34.0)
MCHC: 31.6 g/dL (ref 30.0–36.0)
MCV: 84.7 fL (ref 78.0–100.0)
PLATELETS: 320 10*3/uL (ref 150–400)
RBC: 3.33 MIL/uL — AB (ref 4.22–5.81)
RDW: 19.7 % — ABNORMAL HIGH (ref 11.5–15.5)
WBC: 6.3 10*3/uL (ref 4.0–10.5)

## 2017-04-15 SURGERY — RIGHT HEART CATH
Anesthesia: LOCAL

## 2017-04-15 MED ORDER — HEPARIN (PORCINE) IN NACL 2-0.9 UNIT/ML-% IJ SOLN
INTRAMUSCULAR | Status: AC | PRN
  Administered 2017-04-15: 500 mL

## 2017-04-15 MED ORDER — ENSURE ENLIVE PO LIQD
237.0000 mL | Freq: Three times a day (TID) | ORAL | Status: DC
  Administered 2017-04-15 – 2017-04-16 (×4): 237 mL via ORAL

## 2017-04-15 MED ORDER — FENTANYL CITRATE (PF) 100 MCG/2ML IJ SOLN
INTRAMUSCULAR | Status: DC | PRN
  Administered 2017-04-15: 25 ug via INTRAVENOUS

## 2017-04-15 MED ORDER — TORSEMIDE 20 MG PO TABS
40.0000 mg | ORAL_TABLET | Freq: Two times a day (BID) | ORAL | Status: DC
  Administered 2017-04-15: 40 mg via ORAL
  Filled 2017-04-15: qty 2

## 2017-04-15 MED ORDER — ONDANSETRON HCL 4 MG/2ML IJ SOLN: 4.0000 mg | Freq: Four times a day (QID) | INTRAMUSCULAR | Status: DC | PRN

## 2017-04-15 MED ORDER — SODIUM CHLORIDE 0.9 % IV SOLN: 250.0000 mL | INTRAVENOUS | Status: DC | PRN

## 2017-04-15 MED ORDER — ACETAMINOPHEN 325 MG PO TABS: 650.0000 mg | ORAL_TABLET | ORAL | Status: DC | PRN

## 2017-04-15 MED ORDER — GLIPIZIDE 5 MG PO TABS
5.0000 mg | ORAL_TABLET | Freq: Every day | ORAL | Status: DC
  Administered 2017-04-16: 5 mg via ORAL
  Filled 2017-04-15: qty 1

## 2017-04-15 MED ORDER — MIDAZOLAM HCL 2 MG/2ML IJ SOLN
INTRAMUSCULAR | Status: DC | PRN
  Administered 2017-04-15: 1 mg via INTRAVENOUS

## 2017-04-15 MED ORDER — SODIUM CHLORIDE 0.9% FLUSH
3.0000 mL | Freq: Two times a day (BID) | INTRAVENOUS | Status: DC
  Administered 2017-04-15: 3 mL via INTRAVENOUS

## 2017-04-15 MED ORDER — LIDOCAINE HCL (PF) 1 % IJ SOLN
INTRAMUSCULAR | Status: DC | PRN
  Administered 2017-04-15: 10 mL via SUBCUTANEOUS
  Administered 2017-04-15: 5 mL via SUBCUTANEOUS

## 2017-04-15 MED ORDER — SODIUM CHLORIDE 0.9% FLUSH: 3.0000 mL | INTRAVENOUS | Status: DC | PRN

## 2017-04-15 MED ORDER — FENTANYL CITRATE (PF) 100 MCG/2ML IJ SOLN
INTRAMUSCULAR | Status: AC
  Filled 2017-04-15: qty 2

## 2017-04-15 MED ORDER — LIDOCAINE HCL (PF) 1 % IJ SOLN
INTRAMUSCULAR | Status: AC
  Filled 2017-04-15: qty 30

## 2017-04-15 MED ORDER — HEPARIN (PORCINE) IN NACL 2-0.9 UNIT/ML-% IJ SOLN
INTRAMUSCULAR | Status: AC
  Filled 2017-04-15: qty 500

## 2017-04-15 MED ORDER — MIDAZOLAM HCL 2 MG/2ML IJ SOLN
INTRAMUSCULAR | Status: AC
  Filled 2017-04-15: qty 2

## 2017-04-15 SURGICAL SUPPLY — 9 items
CATH SWAN GANZ 7F STRAIGHT (CATHETERS) ×2 IMPLANT
COVER PRB 48X5XTLSCP FOLD TPE (BAG) ×1 IMPLANT
COVER PROBE 5X48 (BAG) ×1
KIT HEART RIGHT NAMIC (KITS) ×2 IMPLANT
PACK CARDIAC CATHETERIZATION (CUSTOM PROCEDURE TRAY) ×2 IMPLANT
PROTECTION STATION PRESSURIZED (MISCELLANEOUS) ×2
SHEATH PINNACLE 7F 10CM (SHEATH) ×2 IMPLANT
STATION PROTECTION PRESSURIZED (MISCELLANEOUS) ×1 IMPLANT
TRANSDUCER W/STOPCOCK (MISCELLANEOUS) ×2 IMPLANT

## 2017-04-15 NOTE — Progress Notes (Signed)
[facility] to visit with patient.  Upon arrival patient was walking with nurse and wanted to take bath before visit. Will schedule follow up visit with unit Chaplain  To continue support.  Venida JarvisWatlington, Daryl Quiros, [place], [facility], Pager [phone]

## 2017-04-15 NOTE — Interval H&P Note (Signed)
History and Physical Interval Note:  03/29/2017 9:32 AM  James [name]  has presented today for surgery, with the diagnosis of chf  The various methods of treatment have been discussed with the patient and family. After consideration of risks, benefits and other options for treatment, the patient has consented to  Procedure(s): Right Heart Cath (N/A) as a surgical intervention .  The patient's history has been reviewed, patient examined, no change in status, stable for surgery.  I have reviewed the patient's chart and labs.  Questions were answered to the patient's satisfaction.     Ethlyn Alto Chesapeake EnergyMcLean

## 2017-04-15 NOTE — H&P (View-Only) (Signed)
Patient ID: James [name], male   DOB: 03-25-1961, 56 y.o.   MRN: [id number]    SUBJECTIVE: Good diuresis yesterday.  Weight down another 12 lbs, creatinine fairly stable at 1.62.  Co-ox 70%, CVP 12 but was lower overnight.     Hemoglobin stable.  He denies BRBPR/melena.  FOBT negative.   Echo (6/18): EF 15%, mildly dilated/moderately reduced function RV.   RHC Procedural Findings (6/22): Hemodynamics (mmHg) RA mean 20 RV 71/22 PA 70/31 mean 45 PCWP mean 24 CVP/PCWP 0.83 PAPi 1.95 Oxygen saturations: PA 49% AO 100% Cardiac Output (Fick) 4.95  Cardiac Index (Fick) 2.28 PVR 4.24 WU Cardiac Output (Thermo) 5.74 Cardiac Index (Fick) 2.65 PVR 3.65 WU  Scheduled Meds: . amiodarone  400 mg Oral BID  . aspirin EC  81 mg Oral Daily  . atorvastatin  80 mg Oral q1800  . carvedilol  6.25 mg Oral BID WC  . Chlorhexidine Gluconate Cloth  6 each Topical Q0600  . colchicine  0.6 mg Oral Daily  . digoxin  0.0625 mg Oral Daily  . febuxostat  120 mg Oral Daily  . glipiZIDE  5 mg Oral BID AC  . heparin  5,000 Units Subcutaneous Q8H  . Melatonin  3 mg Oral QHS  . mupirocin ointment  1 application Nasal BID  . potassium chloride  40 mEq Oral BID  . sodium chloride flush  10-40 mL Intracatheter Q12H  . sodium chloride flush  3 mL Intravenous Q12H   Continuous Infusions: . sodium chloride 10 mL/hr at 04/19/2017 0622  . ferumoxytol Stopped (04/14/17 1236)  . furosemide (LASIX) infusion 15 mg/hr (04/14/17 2311)  . milrinone 0.25 mcg/kg/min (04/14/17 2031)   PRN Meds:.acetaminophen, ondansetron (ZOFRAN) IV, sodium chloride flush, sodium chloride flush    Vitals:   04/14/17 0842 04/14/17 1934 03/26/2017 0009 04/13/2017 0500  BP:  111/80 104/66 110/73  Pulse: 74 77 71   Resp:  18 (!) 26   Temp:    97.8 F (36.6 C)  TempSrc:    Oral  SpO2:  98% 100%   Weight:    185 lb 12.8 oz (84.3 kg)  Height:        Intake/Output Summary (Last 24 hours) at 04/14/2017 0743 Last data filed at 04/01/2017  0558  Gross per 24 hour  Intake          1778.19 ml  Output             4830 ml  Net         -3051.81 ml    LABS: Basic Metabolic Panel:  Recent Labs  56/21/3006/25/18 0611 03/30/2017 0602  NA 136 135  K 4.2 4.3  CL 100* 96*  CO2 28 29  GLUCOSE 83 72  BUN 32* 29*  CREATININE 1.56* 1.62*  CALCIUM 8.2* 8.3*   Liver Function Tests: No results for input(s): AST, ALT, ALKPHOS, BILITOT, PROT, ALBUMIN in the last 72 hours. No results for input(s): LIPASE, AMYLASE in the last 72 hours. CBC:  Recent Labs  04/14/17 0611 03/26/2017 0602  WBC 5.7 6.3  HGB 7.8* 8.9*  HCT 24.8* 28.2*  MCV 84.9 84.7  PLT 322 320   Cardiac Enzymes: No results for input(s): CKTOTAL, CKMB, CKMBINDEX, TROPONINI in the last 72 hours. BNP: Invalid input(s): POCBNP D-Dimer: No results for input(s): DDIMER in the last 72 hours. Hemoglobin A1C: No results for input(s): HGBA1C in the last 72 hours. Fasting Lipid Panel: No results for input(s): CHOL, HDL, LDLCALC, TRIG, CHOLHDL, LDLDIRECT in  the last 72 hours. Thyroid Function Tests: No results for input(s): TSH, T4TOTAL, T3FREE, THYROIDAB in the last 72 hours.  Invalid input(s): FREET3 Anemia Panel:  Recent Labs  04/12/17 0800  VITAMINB12 585  FERRITIN 264  TIBC 162*  IRON 28*    RADIOLOGY: Ct Abdomen Pelvis Wo Contrast  Result Date: 04/12/2017 CLINICAL DATA:  Patient has chronic systolic CHF, being considered for LVAD EXAM: CT CHEST, ABDOMEN AND PELVIS WITHOUT CONTRAST TECHNIQUE: Multidetector CT imaging of the chest, abdomen and pelvis was performed following the standard protocol without IV contrast. COMPARISON:  None. FINDINGS: CT CHEST FINDINGS Cardiovascular: Thoracic aorta is normal in caliber and configuration. Scattered aortic atherosclerosis. Cardiomegaly. No pericardial effusion. Diffuse coronary artery calcifications. Status post CABG. Mediastinum/Nodes: No mass or lymphadenopathy. Esophagus is unremarkable. Trachea and central bronchi are  unremarkable. Lungs/Pleura: Mild atelectasis/scarring and mild pleural thickening at each lung base. Lungs otherwise clear. Incidentally noted calcified granuloma at the right lung apex. Musculoskeletal: Mild degenerative spurring in the lower thoracic spine. Degenerative changes at the shoulders. No acute or suspicious osseous finding. Status post median sternotomy. Edema within the subcutaneous soft tissues suggesting anasarca. Bilateral gynecomastia. CT ABDOMEN PELVIS FINDINGS Hepatobiliary: Stones within the nondistended gallbladder. No focal liver abnormality is seen. No bile duct dilatation seen. Pancreas: Partially obscured.  Visualized portions unremarkable. Spleen: Normal in size without focal abnormality. Adrenals/Urinary Tract: Beam hardening artifact obscures portions of each kidney probable renal cysts bilaterally, difficult to definitively evaluate with Hounsfield unit measurement due to the beam hardening artifact. No renal stone or hydronephrosis seen. Bladder is unremarkable. Stomach/Bowel: Bowel is normal in caliber. No bowel wall thickening or evidence of bowel wall inflammation seen. Some portions of the bowel are obscured by the aforementioned beam hardening artifact. Vascular/Lymphatic: Aortic atherosclerosis. No enlarged lymph nodes seen. Reproductive: Unremarkable. Other: No free fluid or abscess collections seen. No free intraperitoneal air. Elevation of the left hemidiaphragm, with upper stomach located posterior to the left ventricle. Musculoskeletal: Degenerative changes of the thoracolumbar spine, moderate in degree. No acute or suspicious osseous finding. Ill-defined edema within the subcutaneous soft tissues of the abdomen and pelvis indicating anasarca. IMPRESSION: 1. No acute findings within the chest, abdomen or pelvis. 2. No anatomic variants identified. There is elevation of the left hemidiaphragm with stomach positioned posterior to the left ventricle. 3. Cardiomegaly. Diffuse  coronary artery calcifications. No pericardial effusion seen. 4. Cholelithiasis without evidence of acute cholecystitis. 5. Aortic atherosclerosis. 6. Anasarca. 7. Mild study limitations detailed above. Electronically Signed   By: Bary Richard M.D.   On: 04/12/2017 17:30   Ct Chest Wo Contrast  Result Date: 04/12/2017 CLINICAL DATA:  Patient has chronic systolic CHF, being considered for LVAD EXAM: CT CHEST, ABDOMEN AND PELVIS WITHOUT CONTRAST TECHNIQUE: Multidetector CT imaging of the chest, abdomen and pelvis was performed following the standard protocol without IV contrast. COMPARISON:  None. FINDINGS: CT CHEST FINDINGS Cardiovascular: Thoracic aorta is normal in caliber and configuration. Scattered aortic atherosclerosis. Cardiomegaly. No pericardial effusion. Diffuse coronary artery calcifications. Status post CABG. Mediastinum/Nodes: No mass or lymphadenopathy. Esophagus is unremarkable. Trachea and central bronchi are unremarkable. Lungs/Pleura: Mild atelectasis/scarring and mild pleural thickening at each lung base. Lungs otherwise clear. Incidentally noted calcified granuloma at the right lung apex. Musculoskeletal: Mild degenerative spurring in the lower thoracic spine. Degenerative changes at the shoulders. No acute or suspicious osseous finding. Status post median sternotomy. Edema within the subcutaneous soft tissues suggesting anasarca. Bilateral gynecomastia. CT ABDOMEN PELVIS FINDINGS Hepatobiliary: Stones within the nondistended gallbladder.  No focal liver abnormality is seen. No bile duct dilatation seen. Pancreas: Partially obscured.  Visualized portions unremarkable. Spleen: Normal in size without focal abnormality. Adrenals/Urinary Tract: Beam hardening artifact obscures portions of each kidney probable renal cysts bilaterally, difficult to definitively evaluate with Hounsfield unit measurement due to the beam hardening artifact. No renal stone or hydronephrosis seen. Bladder is  unremarkable. Stomach/Bowel: Bowel is normal in caliber. No bowel wall thickening or evidence of bowel wall inflammation seen. Some portions of the bowel are obscured by the aforementioned beam hardening artifact. Vascular/Lymphatic: Aortic atherosclerosis. No enlarged lymph nodes seen. Reproductive: Unremarkable. Other: No free fluid or abscess collections seen. No free intraperitoneal air. Elevation of the left hemidiaphragm, with upper stomach located posterior to the left ventricle. Musculoskeletal: Degenerative changes of the thoracolumbar spine, moderate in degree. No acute or suspicious osseous finding. Ill-defined edema within the subcutaneous soft tissues of the abdomen and pelvis indicating anasarca. IMPRESSION: 1. No acute findings within the chest, abdomen or pelvis. 2. No anatomic variants identified. There is elevation of the left hemidiaphragm with stomach positioned posterior to the left ventricle. 3. Cardiomegaly. Diffuse coronary artery calcifications. No pericardial effusion seen. 4. Cholelithiasis without evidence of acute cholecystitis. 5. Aortic atherosclerosis. 6. Anasarca. 7. Mild study limitations detailed above. Electronically Signed   By: Bary Richard M.D.   On: 04/12/2017 17:30    PHYSICAL EXAM General: NAD Neck: JVP 10 cm, no thyromegaly or thyroid nodule.  Lungs: Clear to auscultation bilaterally with normal respiratory effort. CV: Lateral PMI.  Heart regular S1/S2, no S3/S4, no murmur.  Trace ankle edema.   Abdomen: Soft, nontender, no hepatosplenomegaly, no distention.  Neurologic: Alert and oriented x 3.  Psych: Normal affect. Extremities: No clubbing or cyanosis.   TELEMETRY: Personally reviewed telemetry pt in NSR   ASSESSMENT AND PLAN: 56 yo with CAD s/p CABG, ischemic cardiomyopathy/chronic systolic CHF, tophaceous gout, and CKD stage 3 was admitted for diuresis and consideration for LVAD placement.  1. Acute/chronic systolic CHF: Ischemic cardiomyopathy.  St  Jude ICD.  Echo (6/18) with EF 15%, mildly dilated RV with moderately decreased systolic function.  RHC yesterday with elevated filling pressures and evidence of RV failure though PAPi score 1.95 so not prohibitive.  He has been seen at [facility] and turned down for transplant with severe tophaceous gout and concern for worsening on transplant meds.  He is on milrinone gtt + Lasix gtt currently, good urine output yesterday with creatinine stable at 1.62.  Co-ox 70%.  CVP 12, coming down.   - Continue milrinone 0.25.  - Continue Lasix gtt 15 mg/hr.  No metolazone today.  - Continue lower dose Coreg 6.25 mg bid.  - Continue LVAD workup, tentatively planned for Thursday implantation.  Will repeat RHC today to reassess on milrinone and after diuresis.  Discussed risks/benefits with patient and he agrees to procedure.  2. CKD stage 3: Creatinine improved somewhat on milrinone and with diuresis.  Continue to watch closely.  3. Anemia: Hgb 8.9, has been fairly stably low.  Patient denies signs of overt GI bleeding and FOBT negative.  May be component of anemia of renal disease/chronic disease.  Fe studies do not show marked Fe deficiency.  Seen by GI, they do not feel that he needs endoscopy.  4. CAD: s/p CABG 2012.  Stable, no chest pain.  5. Gout: Severe tophaceous gout.  On Uloric, will continue colchicine daily.  No pain currently.  6. Arrhythmias: PVCs, short runs AT noted on milrinone.  Quiescent  on amiodarone.   Marca Ancona 04/10/2017 7:43 AM

## 2017-04-15 NOTE — Progress Notes (Signed)
Patient ID: James [name], male   DOB: 03-25-1961, 56 y.o.   MRN: [id number]    SUBJECTIVE: Good diuresis yesterday.  Weight down another 12 lbs, creatinine fairly stable at 1.62.  Co-ox 70%, CVP 12 but was lower overnight.     Hemoglobin stable.  He denies BRBPR/melena.  FOBT negative.   Echo (6/18): EF 15%, mildly dilated/moderately reduced function RV.   RHC Procedural Findings (6/22): Hemodynamics (mmHg) RA mean 20 RV 71/22 PA 70/31 mean 45 PCWP mean 24 CVP/PCWP 0.83 PAPi 1.95 Oxygen saturations: PA 49% AO 100% Cardiac Output (Fick) 4.95  Cardiac Index (Fick) 2.28 PVR 4.24 WU Cardiac Output (Thermo) 5.74 Cardiac Index (Fick) 2.65 PVR 3.65 WU  Scheduled Meds: . amiodarone  400 mg Oral BID  . aspirin EC  81 mg Oral Daily  . atorvastatin  80 mg Oral q1800  . carvedilol  6.25 mg Oral BID WC  . Chlorhexidine Gluconate Cloth  6 each Topical Q0600  . colchicine  0.6 mg Oral Daily  . digoxin  0.0625 mg Oral Daily  . febuxostat  120 mg Oral Daily  . glipiZIDE  5 mg Oral BID AC  . heparin  5,000 Units Subcutaneous Q8H  . Melatonin  3 mg Oral QHS  . mupirocin ointment  1 application Nasal BID  . potassium chloride  40 mEq Oral BID  . sodium chloride flush  10-40 mL Intracatheter Q12H  . sodium chloride flush  3 mL Intravenous Q12H   Continuous Infusions: . sodium chloride 10 mL/hr at 04/19/2017 0622  . ferumoxytol Stopped (04/14/17 1236)  . furosemide (LASIX) infusion 15 mg/hr (04/14/17 2311)  . milrinone 0.25 mcg/kg/min (04/14/17 2031)   PRN Meds:.acetaminophen, ondansetron (ZOFRAN) IV, sodium chloride flush, sodium chloride flush    Vitals:   04/14/17 0842 04/14/17 1934 03/26/2017 0009 04/13/2017 0500  BP:  111/80 104/66 110/73  Pulse: 74 77 71   Resp:  18 (!) 26   Temp:    97.8 F (36.6 C)  TempSrc:    Oral  SpO2:  98% 100%   Weight:    185 lb 12.8 oz (84.3 kg)  Height:        Intake/Output Summary (Last 24 hours) at 04/14/2017 0743 Last data filed at 04/01/2017  0558  Gross per 24 hour  Intake          1778.19 ml  Output             4830 ml  Net         -3051.81 ml    LABS: Basic Metabolic Panel:  Recent Labs  56/21/3006/25/18 0611 03/30/2017 0602  NA 136 135  K 4.2 4.3  CL 100* 96*  CO2 28 29  GLUCOSE 83 72  BUN 32* 29*  CREATININE 1.56* 1.62*  CALCIUM 8.2* 8.3*   Liver Function Tests: No results for input(s): AST, ALT, ALKPHOS, BILITOT, PROT, ALBUMIN in the last 72 hours. No results for input(s): LIPASE, AMYLASE in the last 72 hours. CBC:  Recent Labs  04/14/17 0611 03/26/2017 0602  WBC 5.7 6.3  HGB 7.8* 8.9*  HCT 24.8* 28.2*  MCV 84.9 84.7  PLT 322 320   Cardiac Enzymes: No results for input(s): CKTOTAL, CKMB, CKMBINDEX, TROPONINI in the last 72 hours. BNP: Invalid input(s): POCBNP D-Dimer: No results for input(s): DDIMER in the last 72 hours. Hemoglobin A1C: No results for input(s): HGBA1C in the last 72 hours. Fasting Lipid Panel: No results for input(s): CHOL, HDL, LDLCALC, TRIG, CHOLHDL, LDLDIRECT in  the last 72 hours. Thyroid Function Tests: No results for input(s): TSH, T4TOTAL, T3FREE, THYROIDAB in the last 72 hours.  Invalid input(s): FREET3 Anemia Panel:  Recent Labs  04/12/17 0800  VITAMINB12 585  FERRITIN 264  TIBC 162*  IRON 28*    RADIOLOGY: Ct Abdomen Pelvis Wo Contrast  Result Date: 04/12/2017 CLINICAL DATA:  Patient has chronic systolic CHF, being considered for LVAD EXAM: CT CHEST, ABDOMEN AND PELVIS WITHOUT CONTRAST TECHNIQUE: Multidetector CT imaging of the chest, abdomen and pelvis was performed following the standard protocol without IV contrast. COMPARISON:  None. FINDINGS: CT CHEST FINDINGS Cardiovascular: Thoracic aorta is normal in caliber and configuration. Scattered aortic atherosclerosis. Cardiomegaly. No pericardial effusion. Diffuse coronary artery calcifications. Status post CABG. Mediastinum/Nodes: No mass or lymphadenopathy. Esophagus is unremarkable. Trachea and central bronchi are  unremarkable. Lungs/Pleura: Mild atelectasis/scarring and mild pleural thickening at each lung base. Lungs otherwise clear. Incidentally noted calcified granuloma at the right lung apex. Musculoskeletal: Mild degenerative spurring in the lower thoracic spine. Degenerative changes at the shoulders. No acute or suspicious osseous finding. Status post median sternotomy. Edema within the subcutaneous soft tissues suggesting anasarca. Bilateral gynecomastia. CT ABDOMEN PELVIS FINDINGS Hepatobiliary: Stones within the nondistended gallbladder. No focal liver abnormality is seen. No bile duct dilatation seen. Pancreas: Partially obscured.  Visualized portions unremarkable. Spleen: Normal in size without focal abnormality. Adrenals/Urinary Tract: Beam hardening artifact obscures portions of each kidney probable renal cysts bilaterally, difficult to definitively evaluate with Hounsfield unit measurement due to the beam hardening artifact. No renal stone or hydronephrosis seen. Bladder is unremarkable. Stomach/Bowel: Bowel is normal in caliber. No bowel wall thickening or evidence of bowel wall inflammation seen. Some portions of the bowel are obscured by the aforementioned beam hardening artifact. Vascular/Lymphatic: Aortic atherosclerosis. No enlarged lymph nodes seen. Reproductive: Unremarkable. Other: No free fluid or abscess collections seen. No free intraperitoneal air. Elevation of the left hemidiaphragm, with upper stomach located posterior to the left ventricle. Musculoskeletal: Degenerative changes of the thoracolumbar spine, moderate in degree. No acute or suspicious osseous finding. Ill-defined edema within the subcutaneous soft tissues of the abdomen and pelvis indicating anasarca. IMPRESSION: 1. No acute findings within the chest, abdomen or pelvis. 2. No anatomic variants identified. There is elevation of the left hemidiaphragm with stomach positioned posterior to the left ventricle. 3. Cardiomegaly. Diffuse  coronary artery calcifications. No pericardial effusion seen. 4. Cholelithiasis without evidence of acute cholecystitis. 5. Aortic atherosclerosis. 6. Anasarca. 7. Mild study limitations detailed above. Electronically Signed   By: Bary Richard M.D.   On: 04/12/2017 17:30   Ct Chest Wo Contrast  Result Date: 04/12/2017 CLINICAL DATA:  Patient has chronic systolic CHF, being considered for LVAD EXAM: CT CHEST, ABDOMEN AND PELVIS WITHOUT CONTRAST TECHNIQUE: Multidetector CT imaging of the chest, abdomen and pelvis was performed following the standard protocol without IV contrast. COMPARISON:  None. FINDINGS: CT CHEST FINDINGS Cardiovascular: Thoracic aorta is normal in caliber and configuration. Scattered aortic atherosclerosis. Cardiomegaly. No pericardial effusion. Diffuse coronary artery calcifications. Status post CABG. Mediastinum/Nodes: No mass or lymphadenopathy. Esophagus is unremarkable. Trachea and central bronchi are unremarkable. Lungs/Pleura: Mild atelectasis/scarring and mild pleural thickening at each lung base. Lungs otherwise clear. Incidentally noted calcified granuloma at the right lung apex. Musculoskeletal: Mild degenerative spurring in the lower thoracic spine. Degenerative changes at the shoulders. No acute or suspicious osseous finding. Status post median sternotomy. Edema within the subcutaneous soft tissues suggesting anasarca. Bilateral gynecomastia. CT ABDOMEN PELVIS FINDINGS Hepatobiliary: Stones within the nondistended gallbladder.  No focal liver abnormality is seen. No bile duct dilatation seen. Pancreas: Partially obscured.  Visualized portions unremarkable. Spleen: Normal in size without focal abnormality. Adrenals/Urinary Tract: Beam hardening artifact obscures portions of each kidney probable renal cysts bilaterally, difficult to definitively evaluate with Hounsfield unit measurement due to the beam hardening artifact. No renal stone or hydronephrosis seen. Bladder is  unremarkable. Stomach/Bowel: Bowel is normal in caliber. No bowel wall thickening or evidence of bowel wall inflammation seen. Some portions of the bowel are obscured by the aforementioned beam hardening artifact. Vascular/Lymphatic: Aortic atherosclerosis. No enlarged lymph nodes seen. Reproductive: Unremarkable. Other: No free fluid or abscess collections seen. No free intraperitoneal air. Elevation of the left hemidiaphragm, with upper stomach located posterior to the left ventricle. Musculoskeletal: Degenerative changes of the thoracolumbar spine, moderate in degree. No acute or suspicious osseous finding. Ill-defined edema within the subcutaneous soft tissues of the abdomen and pelvis indicating anasarca. IMPRESSION: 1. No acute findings within the chest, abdomen or pelvis. 2. No anatomic variants identified. There is elevation of the left hemidiaphragm with stomach positioned posterior to the left ventricle. 3. Cardiomegaly. Diffuse coronary artery calcifications. No pericardial effusion seen. 4. Cholelithiasis without evidence of acute cholecystitis. 5. Aortic atherosclerosis. 6. Anasarca. 7. Mild study limitations detailed above. Electronically Signed   By: Bary Richard M.D.   On: 04/12/2017 17:30    PHYSICAL EXAM General: NAD Neck: JVP 10 cm, no thyromegaly or thyroid nodule.  Lungs: Clear to auscultation bilaterally with normal respiratory effort. CV: Lateral PMI.  Heart regular S1/S2, no S3/S4, no murmur.  Trace ankle edema.   Abdomen: Soft, nontender, no hepatosplenomegaly, no distention.  Neurologic: Alert and oriented x 3.  Psych: Normal affect. Extremities: No clubbing or cyanosis.   TELEMETRY: Personally reviewed telemetry pt in NSR   ASSESSMENT AND PLAN: 56 yo with CAD s/p CABG, ischemic cardiomyopathy/chronic systolic CHF, tophaceous gout, and CKD stage 3 was admitted for diuresis and consideration for LVAD placement.  1. Acute/chronic systolic CHF: Ischemic cardiomyopathy.  St  Jude ICD.  Echo (6/18) with EF 15%, mildly dilated RV with moderately decreased systolic function.  RHC yesterday with elevated filling pressures and evidence of RV failure though PAPi score 1.95 so not prohibitive.  He has been seen at [facility] and turned down for transplant with severe tophaceous gout and concern for worsening on transplant meds.  He is on milrinone gtt + Lasix gtt currently, good urine output yesterday with creatinine stable at 1.62.  Co-ox 70%.  CVP 12, coming down.   - Continue milrinone 0.25.  - Continue Lasix gtt 15 mg/hr.  No metolazone today.  - Continue lower dose Coreg 6.25 mg bid.  - Continue LVAD workup, tentatively planned for Thursday implantation.  Will repeat RHC today to reassess on milrinone and after diuresis.  Discussed risks/benefits with patient and he agrees to procedure.  2. CKD stage 3: Creatinine improved somewhat on milrinone and with diuresis.  Continue to watch closely.  3. Anemia: Hgb 8.9, has been fairly stably low.  Patient denies signs of overt GI bleeding and FOBT negative.  May be component of anemia of renal disease/chronic disease.  Fe studies do not show marked Fe deficiency.  Seen by GI, they do not feel that he needs endoscopy.  4. CAD: s/p CABG 2012.  Stable, no chest pain.  5. Gout: Severe tophaceous gout.  On Uloric, will continue colchicine daily.  No pain currently.  6. Arrhythmias: PVCs, short runs AT noted on milrinone.  Quiescent  on amiodarone.   Marca Ancona 03/21/2017 7:43 AM

## 2017-04-15 NOTE — Care Management Note (Signed)
Case Management Note  Patient Details  Name: James [name] MRN: [id number] Date of Birth: August 02, 1961  Subjective/Objective: Pt presented for CHF exacerbation. Pt initiated on IV Lasix and Milrinone gtt. Plan to continue LVAD workup.  Pt is from home with wife.                   Action/Plan: CM will continue to monitor for additional needs.   Expected Discharge Date:                  Expected Discharge Plan:  Home w Home Health Services  In-House Referral:  NA  Discharge planning Services  CM Consult  Post Acute Care Choice:    Choice offered to:     DME Arranged:    DME Agency:     HH Arranged:    HH Agency:     Status of Service:  In process, will continue to follow  If discussed at Long Length of Stay Meetings, dates discussed:    Additional Comments:  [name], [name], RN 04/05/2017, 12:14 PM

## 2017-04-15 NOTE — Progress Notes (Signed)
CARDIAC REHAB PHASE I   PRE:  Rate/Rhythm: 64 SR  BP:  Sitting: 103/71        SaO2: 98 RA  MODE:  Ambulation: 450 ft   POST:  Rate/Rhythm: 76 SR  BP:  Sitting: 109/84         SaO2: 98 RA  Pt just completed bedrest post cath, agreeable to walk. Pt ambulated 450 ft on RA, IV, assist x1, slow, steady gait, tolerated well with no complaints. Pt requesting to wash up after walk, states he has been doing this independently, RN aware. Will follow.   [id number] Joylene GrapesEmily C Alliyah Roesler, RN, BSN 03/25/2017 2:51 PM

## 2017-04-15 NOTE — Progress Notes (Signed)
Initial Nutrition Assessment  DOCUMENTATION CODES:   Severe malnutrition in context of chronic illness  INTERVENTION:    Recommend liberalizing diet to 2 gm sodium with fluid restriction. Patient does not need renal restrictions (low potassium & low phosphorus) since serum levels are WNL.  Ensure Enlive po TID, each supplement provides 350 kcal and 20 grams of protein  NUTRITION DIAGNOSIS:   Malnutrition (severe) related to chronic illness (CHF) as evidenced by severe depletion of body fat, severe depletion of muscle mass.  GOAL:   Patient will meet greater than or equal to 90% of their needs  MONITOR:   PO intake, Supplement acceptance, Labs, I & O's  REASON FOR ASSESSMENT:   Consult Assessment of nutrition requirement/status (VAD evaluation)  ASSESSMENT:   56 yo male with hx of HTN, gout, obesity, chronic edema, CAD, ischemic cardiomyopathy, DM2, AICD, MI, CHF, renal insufficiency who was admitted on 6/22 with acute on chronic systolic heart failure.  Received MD Consult for VAD evaluation. Patient reports good PO intake since admission, but that meals are very small and he does not think he is getting enough to eat. He is consuming 100% of meals.  Nutrition-Focused physical exam completed. Findings are severe fat depletion, severe muscle depletion, and severe edema.  9% weight loss within the past month is partially related to fluid status. Patient reports usual weight ~205 lbs, now down to 185 lbs. Weight was likely increasing with fluids and decreasing with muscle and fat loss at the same time, resulting in weight maintenance. Patient enjoys Ensure & Boost supplements. Labs and medications reviewed.  Given potassium WNL, do not recommend renal diet restriction at this time.  Diet Order:  Diet renal with fluid restriction Fluid restriction: 1200 mL Fluid; Room service appropriate? Yes; Fluid consistency: Thin  Skin:  Wound (see comment) (open wound R leg; boil R  buttocks)  Last BM:  6/25  Height:   Ht Readings from Last 1 Encounters:  04/18/2017 6' (1.829 m)    Weight:   Wt Readings from Last 1 Encounters:  03/24/2017 185 lb 12.8 oz (84.3 kg)    Ideal Body Weight:  80.9 kg  BMI:  Body mass index is 25.2 kg/m.  Estimated Nutritional Needs:   Kcal:  2100-2300  Protein:  115-130 gm  Fluid:  2.1-2.3 L  EDUCATION NEEDS:   Education needs addressed  Joaquin CourtsKimberly Harris, RD, LDN, CNSC Pager [phone] After Hours Pager [phone]

## 2017-04-16 ENCOUNTER — Inpatient Hospital Stay (HOSPITAL_COMMUNITY): Payer: Medicare PPO

## 2017-04-16 ENCOUNTER — Encounter (HOSPITAL_COMMUNITY): Payer: Self-pay | Admitting: Certified Registered Nurse Anesthetist

## 2017-04-16 DIAGNOSIS — Z7189 Other specified counseling: Secondary | ICD-10-CM

## 2017-04-16 DIAGNOSIS — Z515 Encounter for palliative care: Secondary | ICD-10-CM

## 2017-04-16 LAB — COOXEMETRY PANEL
Carboxyhemoglobin: 1.8 % — ABNORMAL HIGH (ref 0.5–1.5)
Methemoglobin: 0.6 % (ref 0.0–1.5)
O2 Saturation: 65.9 %
TOTAL HEMOGLOBIN: 8.5 g/dL — AB (ref 12.0–16.0)

## 2017-04-16 LAB — BASIC METABOLIC PANEL
Anion gap: 9 (ref 5–15)
BUN: 33 mg/dL — AB (ref 6–20)
CALCIUM: 8.1 mg/dL — AB (ref 8.9–10.3)
CHLORIDE: 95 mmol/L — AB (ref 101–111)
CO2: 29 mmol/L (ref 22–32)
CREATININE: 1.84 mg/dL — AB (ref 0.61–1.24)
GFR calc non Af Amer: 40 mL/min — ABNORMAL LOW (ref >60)
GFR, EST AFRICAN AMERICAN: 46 mL/min — AB (ref >60)
Glucose, Bld: 97 mg/dL (ref 65–99)
Potassium: 5 mmol/L (ref 3.5–5.1)
SODIUM: 133 mmol/L — AB (ref 135–145)

## 2017-04-16 LAB — CBC
HCT: 26.2 % — ABNORMAL LOW (ref 39.0–52.0)
Hemoglobin: 8.1 g/dL — ABNORMAL LOW (ref 13.0–17.0)
MCH: 26.5 pg (ref 26.0–34.0)
MCHC: 30.9 g/dL (ref 30.0–36.0)
MCV: 85.6 fL (ref 78.0–100.0)
Platelets: 318 10*3/uL (ref 150–400)
RBC: 3.06 MIL/uL — ABNORMAL LOW (ref 4.22–5.81)
RDW: 19.8 % — AB (ref 11.5–15.5)
WBC: 4.2 10*3/uL (ref 4.0–10.5)

## 2017-04-16 LAB — GLUCOSE, CAPILLARY
GLUCOSE-CAPILLARY: 77 mg/dL (ref 65–99)
GLUCOSE-CAPILLARY: 110 mg/dL — AB (ref 65–99)
Glucose-Capillary: 104 mg/dL — ABNORMAL HIGH (ref 65–99)
Glucose-Capillary: 139 mg/dL — ABNORMAL HIGH (ref 65–99)

## 2017-04-16 LAB — PREPARE RBC (CROSSMATCH)

## 2017-04-16 MED ORDER — DOPAMINE-DEXTROSE 3.2-5 MG/ML-% IV SOLN
0.0000 ug/kg/min | INTRAVENOUS | Status: DC
  Filled 2017-04-16: qty 250

## 2017-04-16 MED ORDER — CHLORHEXIDINE GLUCONATE 0.12 % MT SOLN
15.0000 mL | Freq: Once | OROMUCOSAL | Status: AC
  Administered 2017-04-17: 15 mL via OROMUCOSAL
  Filled 2017-04-16: qty 15

## 2017-04-16 MED ORDER — POTASSIUM CHLORIDE 2 MEQ/ML IV SOLN
80.0000 meq | INTRAVENOUS | Status: DC
  Filled 2017-04-16: qty 40

## 2017-04-16 MED ORDER — DIAZEPAM 5 MG PO TABS
5.0000 mg | ORAL_TABLET | Freq: Once | ORAL | Status: AC
  Administered 2017-04-17: 5 mg via ORAL
  Filled 2017-04-16: qty 1

## 2017-04-16 MED ORDER — CHLORHEXIDINE GLUCONATE CLOTH 2 % EX PADS: 6.0000 | MEDICATED_PAD | Freq: Once | CUTANEOUS | Status: AC

## 2017-04-16 MED ORDER — CHLORHEXIDINE GLUCONATE CLOTH 2 % EX PADS
6.0000 | MEDICATED_PAD | Freq: Once | CUTANEOUS | Status: AC
  Administered 2017-04-17: 6 via TOPICAL

## 2017-04-16 MED ORDER — DEXTROSE 5 % IV SOLN
750.0000 mg | INTRAVENOUS | Status: DC
  Filled 2017-04-16: qty 750

## 2017-04-16 MED ORDER — AMIODARONE HCL 200 MG PO TABS
200.0000 mg | ORAL_TABLET | Freq: Two times a day (BID) | ORAL | Status: DC
  Administered 2017-04-16 – 2017-04-23 (×13): 200 mg via ORAL
  Filled 2017-04-16 (×13): qty 1

## 2017-04-16 MED ORDER — DOBUTAMINE IN D5W 4-5 MG/ML-% IV SOLN
2.0000 ug/kg/min | INTRAVENOUS | Status: DC
  Filled 2017-04-16: qty 250

## 2017-04-16 MED ORDER — MAGNESIUM SULFATE 50 % IJ SOLN
40.0000 meq | INTRAMUSCULAR | Status: DC
  Filled 2017-04-16: qty 10

## 2017-04-16 MED ORDER — BISACODYL 5 MG PO TBEC: 5.0000 mg | DELAYED_RELEASE_TABLET | Freq: Once | ORAL | Status: DC

## 2017-04-16 MED ORDER — CHLORHEXIDINE GLUCONATE CLOTH 2 % EX PADS
6.0000 | MEDICATED_PAD | Freq: Once | CUTANEOUS | Status: AC
  Administered 2017-04-16: 6 via TOPICAL

## 2017-04-16 MED ORDER — HEPARIN SODIUM (PORCINE) 1000 UNIT/ML IJ SOLN
INTRAMUSCULAR | Status: DC
  Filled 2017-04-16: qty 30

## 2017-04-16 MED ORDER — MILRINONE LACTATE IN DEXTROSE 20-5 MG/100ML-% IV SOLN
0.3000 ug/kg/min | INTRAVENOUS | Status: DC
  Filled 2017-04-16: qty 100

## 2017-04-16 MED ORDER — TEMAZEPAM 15 MG PO CAPS: 15.0000 mg | ORAL_CAPSULE | Freq: Once | ORAL | Status: DC | PRN

## 2017-04-16 MED ORDER — VASOPRESSIN 20 UNIT/ML IV SOLN
0.0400 [IU]/min | INTRAVENOUS | Status: DC
  Filled 2017-04-16: qty 2

## 2017-04-16 MED ORDER — DEXTROSE 5 % IV SOLN
1.5000 g | INTRAVENOUS | Status: AC
  Administered 2017-04-17: .75 g via INTRAVENOUS
  Administered 2017-04-17: 1.5 g via INTRAVENOUS
  Filled 2017-04-16: qty 1.5

## 2017-04-16 MED ORDER — NOREPINEPHRINE BITARTRATE 1 MG/ML IV SOLN
0.0000 ug/min | INTRAVENOUS | Status: AC
  Administered 2017-04-17: 1 ug/min via INTRAVENOUS
  Filled 2017-04-16: qty 4

## 2017-04-16 MED ORDER — NITROGLYCERIN IN D5W 200-5 MCG/ML-% IV SOLN
0.0000 ug/min | INTRAVENOUS | Status: DC
  Filled 2017-04-16: qty 250

## 2017-04-16 MED ORDER — EPINEPHRINE PF 1 MG/ML IJ SOLN
0.0000 ug/min | INTRAVENOUS | Status: AC
  Administered 2017-04-17: 2 ug/min via INTRAVENOUS
  Filled 2017-04-16: qty 4

## 2017-04-16 MED ORDER — TRANEXAMIC ACID (OHS) BOLUS VIA INFUSION
15.0000 mg/kg | INTRAVENOUS | Status: AC
  Administered 2017-04-17: 1240.5 mg via INTRAVENOUS
  Filled 2017-04-16: qty 1241

## 2017-04-16 MED ORDER — TRANEXAMIC ACID (OHS) PUMP PRIME SOLUTION
2.0000 mg/kg | INTRAVENOUS | Status: DC
  Filled 2017-04-16: qty 1.65

## 2017-04-16 MED ORDER — BOOST / RESOURCE BREEZE PO LIQD
1.0000 | Freq: Three times a day (TID) | ORAL | Status: DC
  Administered 2017-04-16 (×2): 1 via ORAL

## 2017-04-16 MED ORDER — SODIUM CHLORIDE 0.9 % IV SOLN
0.0000 ug/min | INTRAVENOUS | Status: DC
  Filled 2017-04-16: qty 2

## 2017-04-16 MED ORDER — DEXMEDETOMIDINE HCL IN NACL 400 MCG/100ML IV SOLN
0.1000 ug/kg/h | INTRAVENOUS | Status: AC
  Administered 2017-04-17: .3 ug/kg/h via INTRAVENOUS
  Filled 2017-04-16: qty 100

## 2017-04-16 MED ORDER — VANCOMYCIN HCL 10 G IV SOLR
1250.0000 mg | INTRAVENOUS | Status: AC
  Administered 2017-04-17: 1250 mg via INTRAVENOUS
  Filled 2017-04-16: qty 1250

## 2017-04-16 MED ORDER — SODIUM CHLORIDE 0.9 % IV SOLN
600.0000 mg | INTRAVENOUS | Status: DC
  Filled 2017-04-16: qty 600

## 2017-04-16 MED ORDER — SODIUM CHLORIDE 0.9 % IV SOLN
1.5000 mg/kg/h | INTRAVENOUS | Status: AC
  Administered 2017-04-17: 1.5 mg/kg/h via INTRAVENOUS
  Filled 2017-04-16: qty 25

## 2017-04-16 MED ORDER — MUPIROCIN 2 % EX OINT
1.0000 "application " | TOPICAL_OINTMENT | Freq: Two times a day (BID) | CUTANEOUS | Status: DC
  Administered 2017-04-16: 1 via NASAL

## 2017-04-16 MED ORDER — FLUCONAZOLE IN SODIUM CHLORIDE 400-0.9 MG/200ML-% IV SOLN
400.0000 mg | INTRAVENOUS | Status: AC
  Administered 2017-04-17: 400 mg via INTRAVENOUS
  Filled 2017-04-16: qty 200

## 2017-04-16 MED ORDER — SODIUM CHLORIDE 0.9 % IV SOLN
INTRAVENOUS | Status: AC
  Administered 2017-04-17: .9 [IU]/h via INTRAVENOUS
  Filled 2017-04-16: qty 1

## 2017-04-16 MED ORDER — TORSEMIDE 20 MG PO TABS
20.0000 mg | ORAL_TABLET | Freq: Every day | ORAL | Status: DC
  Administered 2017-04-16: 20 mg via ORAL
  Filled 2017-04-16: qty 1

## 2017-04-16 MED ORDER — VANCOMYCIN HCL 1000 MG IV SOLR
1000.0000 mg | INTRAVENOUS | Status: AC
  Administered 2017-04-17: 1000 mg
  Filled 2017-04-16 (×2): qty 1000

## 2017-04-16 MED ORDER — RIFAMPIN 600 MG IV SOLR
600.0000 mg | INTRAVENOUS | Status: AC
  Administered 2017-04-17: 600 mg via INTRAVENOUS
  Filled 2017-04-16: qty 600

## 2017-04-16 NOTE — Progress Notes (Addendum)
James [name] has been discussed with the VAD Medical Review board on 04/14/2017. The team feels as if the patient is a good candidate for Destination LVAD therapy. The patient meets criteria for a LVAD implant as listed below:  1)  Has failed to respond to optimal medical management (including beta-blockers and ACE inhibitors if tolerated) for at least 45 of the last 60 days, or have been balloon dependent for 7 days, or IV inotrope dependent for 14 days; and,       *On Inotropes Milrinone started 03/22/2017  2)  Has a left ventricular ejection fraction (LVEF) < 25% and, have demonstrated functional limitation with a peak oxygen consumption of <14 ml/kg/min unless balloon pump or inotrope dependent or physically unable to perform the test.         *EF 15-20  By echo (date)  04/14/17            *CPX (12/17)  pVO2 __8.9__ RER___1.12__ VE/VCo2 slope___51__   3)  Social work and palliative care evaluations demonstrate appropriate support system in place for discharge to home with a VAD and that end of life discussions have taken place. Both services have expressed no concern regarding patient's candidacy.         *Social work consult (date): 03/29/2017        *Palliative Care Consult (date): 04/14/2017  4)  Primary caretaker identified that can be taught along with the patient how to manage        the VAD equipment.        *Name: James [name] (wife) 5)  Deemed appropriate by our financial coordinator: Cecilio Asper        Prior approval: 04/16/2017  6)  VAD Coordinator, Balinda Quails RN has met with patient and caregiver, shown them the VAD equipment and discussed with the patient and caregiver about lifestyle changes necessary for success on mechanical circulatory device.        *Met with [name] on 03/30/2017.       *Consent for VAD Evaluation/Caregiver Agreement/HIPPA Release/Photo Release signed on 11/12/2016  7)  Patient has been discussed with [facility] (Dr.Devore) and felt to be an appropriate  candidate.    8)  Intermacs profile: 3  INTERMACS 1: Critical cardiogenic shock describes a patient who is "crashing and burning", in which a patient has life-threatening hypotension and rapidly escalating inotropic pressor support, with critical organ hypoperfusion often confirmed by worsening acidosis and lactate levels.  INTERMACS 2: Progressive decline describes a patient who has been demonstrated "dependent" on inotropic support but nonetheless shows signs of continuing deterioration in nutrition, renal function, fluid retention, or other major status indicator. Patient profile 2 can also describe a patient with refractory volume overload, perhaps with evidence of impaired perfusion, in whom inotropic infusions cannot be maintained due to tachyarrhythmias, clinical ischemia, or other intolerance.  INTERMACS 3: Stable but inotrope dependent describes a patient who is clinically stable on mild-moderate doses of intravenous inotropes (or has a temporary circulatory support device) after repeated documentation of failure to wean without symptomatic hypotension, worsening symptoms, or progressive organ dysfunction (usually renal). It is critical to monitor nutrition, renal function, fluid balance, and overall status carefully in order to distinguish between a  patient who is truly stable at Patient Profile 3 and a patient who has unappreciated decline rendering them Patient Profile 2. This patient may be either at home or in the hospital.   INTERMACS 4: Resting symptoms describes a patient who is at home on oral  therapy but frequently has symptoms of congestion at rest or with activities of daily living (ADL). He or she may have orthopnea, shortness of breath during ADL such as dressing or bathing, gastrointestinal symptoms (abdominal discomfort, nausea, poor appetite), disabling ascites or severe lower extremity edema. This patient should be carefully considered for more intensive management and  surveillance programs, which may in some cases, reveal poor compliance that would compromise outcomes with any therapy.  .  INTERMACS 5: Exertion Intolerant describes a patient who is comfortable at rest but unable to engage in any activity, living predominantly within the house or housebound. This patient has no congestive symptoms, but may have chronically elevated volume status, frequently with renal dysfunction, and may be characterized as exercise intolerant.   INTERMACS 6: Exertion Limited also describes a patient who is comfortable at rest without evidence of fluid overload, but who is able to do some mild activity. Activities of daily living are comfortable and minor activities outside the home such as visiting friends or going to a restaurant can be performed, but fatigue results within a few minutes of any meaningful physical exertion. This patient has occasional episodes of worsening symptoms and is likely to have had a hospitalization for heart failure within the past year.   INTERMACS 7: Advanced NYHA Class 3 describes a patient who is clinically stable with a reasonable level of comfortable activity, despite history of previous decompensation that is not recent. This patient is usually able to walk more than a block. Any decompensation requiring intravenous diuretics or hospitalization within the previous month should make this person a Patient Profile 6 or lower.    9)  NYHA Class: IV   Balinda Quails RN, VAD Coordinator 24/7 pager [phone]

## 2017-04-16 NOTE — Progress Notes (Signed)
Patient ID: James [name], male   DOB: 08-27-1961, 56 y.o.   MRN: [id number]    SUBJECTIVE: IV Lasix stopped yesterday, po torsemide started.  No dyspnea, walking halls without significant dyspnea.   Hemoglobin stable.  He denies BRBPR/melena.  FOBT negative.   Echo (6/18): EF 15%, mildly dilated/moderately reduced function RV.   RHC Procedural Findings (6/22): Hemodynamics (mmHg) RA mean 20 RV 71/22 PA 70/31 mean 45 PCWP mean 24 CVP/PCWP 0.83 PAPi 1.95 Oxygen saturations: PA 49% AO 100% Cardiac Output (Fick) 4.95  Cardiac Index (Fick) 2.28 PVR 4.24 WU Cardiac Output (Thermo) 5.74 Cardiac Index (Fick) 2.65 PVR 3.65 WU  RHC Procedural Findings (6/26, on milrinone 0.25 mcg/kg/min): Hemodynamics (mmHg) RA 7 RV 48/7 PA 52/23, mean 33 PCWP mean 17 CVP/PCWP 0.41  PAPi 4.1 Oxygen saturations: PA 62% AO 100% Cardiac Output (Fick) 6.13  Cardiac Index (Fick) 2.89 PVR 2.6 WU Cardiac Output (Thermo) 4.88 Cardiac Index (Thermo) 2.3  PVR 3.3 WU   Scheduled Meds: . amiodarone  200 mg Oral BID  . aspirin EC  81 mg Oral Daily  . atorvastatin  80 mg Oral q1800  . carvedilol  6.25 mg Oral BID WC  . colchicine  0.6 mg Oral Daily  . digoxin  0.0625 mg Oral Daily  . febuxostat  120 mg Oral Daily  . feeding supplement (ENSURE ENLIVE)  237 mL Oral TID BM  . glipiZIDE  5 mg Oral QAC breakfast  . heparin  5,000 Units Subcutaneous Q8H  . Melatonin  3 mg Oral QHS  . sodium chloride flush  10-40 mL Intracatheter Q12H  . sodium chloride flush  3 mL Intravenous Q12H  . sodium chloride flush  3 mL Intravenous Q12H  . torsemide  20 mg Oral Daily   Continuous Infusions: . sodium chloride    . ferumoxytol Stopped (04/14/17 1236)  . milrinone 0.25 mcg/kg/min (04/04/2017 2233)   PRN Meds:.sodium chloride, acetaminophen, ondansetron (ZOFRAN) IV, sodium chloride flush, sodium chloride flush, sodium chloride flush    Vitals:   03/30/2017 1645 04/14/2017 1923 04/01/2017 2300 04/16/17 0624  BP:  103/69 94/62 103/74 118/77  Pulse:  73 73 71  Resp:  (!) 22 (!) 28 20  Temp: 97.9 F (36.6 C) 98.1 F (36.7 C) 98 F (36.7 C) 98.1 F (36.7 C)  TempSrc: Oral Axillary Oral Oral  SpO2: 100% 98% 100% 100%  Weight:    182 lb 4.8 oz (82.7 kg)  Height:        Intake/Output Summary (Last 24 hours) at 04/16/17 0852 Last data filed at 04/16/17 0600  Gross per 24 hour  Intake           861.51 ml  Output             1225 ml  Net          -363.49 ml    LABS: Basic Metabolic Panel:  Recent Labs  40/98/11 0602 04/16/17 0517  NA 135 133*  K 4.3 5.0  CL 96* 95*  CO2 29 29  GLUCOSE 72 97  BUN 29* 33*  CREATININE 1.62* 1.84*  CALCIUM 8.3* 8.1*   Liver Function Tests: No results for input(s): AST, ALT, ALKPHOS, BILITOT, PROT, ALBUMIN in the last 72 hours. No results for input(s): LIPASE, AMYLASE in the last 72 hours. CBC:  Recent Labs  04/13/2017 0602 04/16/17 0517  WBC 6.3 4.2  HGB 8.9* 8.1*  HCT 28.2* 26.2*  MCV 84.7 85.6  PLT 320 318   Cardiac  Enzymes: No results for input(s): CKTOTAL, CKMB, CKMBINDEX, TROPONINI in the last 72 hours. BNP: Invalid input(s): POCBNP D-Dimer: No results for input(s): DDIMER in the last 72 hours. Hemoglobin A1C: No results for input(s): HGBA1C in the last 72 hours. Fasting Lipid Panel: No results for input(s): CHOL, HDL, LDLCALC, TRIG, CHOLHDL, LDLDIRECT in the last 72 hours. Thyroid Function Tests: No results for input(s): TSH, T4TOTAL, T3FREE, THYROIDAB in the last 72 hours.  Invalid input(s): FREET3 Anemia Panel: No results for input(s): VITAMINB12, FOLATE, FERRITIN, TIBC, IRON, RETICCTPCT in the last 72 hours.  RADIOLOGY: Ct Abdomen Pelvis Wo Contrast  Result Date: 04/12/2017 CLINICAL DATA:  Patient has chronic systolic CHF, being considered for LVAD EXAM: CT CHEST, ABDOMEN AND PELVIS WITHOUT CONTRAST TECHNIQUE: Multidetector CT imaging of the chest, abdomen and pelvis was performed following the standard protocol without IV  contrast. COMPARISON:  None. FINDINGS: CT CHEST FINDINGS Cardiovascular: Thoracic aorta is normal in caliber and configuration. Scattered aortic atherosclerosis. Cardiomegaly. No pericardial effusion. Diffuse coronary artery calcifications. Status post CABG. Mediastinum/Nodes: No mass or lymphadenopathy. Esophagus is unremarkable. Trachea and central bronchi are unremarkable. Lungs/Pleura: Mild atelectasis/scarring and mild pleural thickening at each lung base. Lungs otherwise clear. Incidentally noted calcified granuloma at the right lung apex. Musculoskeletal: Mild degenerative spurring in the lower thoracic spine. Degenerative changes at the shoulders. No acute or suspicious osseous finding. Status post median sternotomy. Edema within the subcutaneous soft tissues suggesting anasarca. Bilateral gynecomastia. CT ABDOMEN PELVIS FINDINGS Hepatobiliary: Stones within the nondistended gallbladder. No focal liver abnormality is seen. No bile duct dilatation seen. Pancreas: Partially obscured.  Visualized portions unremarkable. Spleen: Normal in size without focal abnormality. Adrenals/Urinary Tract: Beam hardening artifact obscures portions of each kidney probable renal cysts bilaterally, difficult to definitively evaluate with Hounsfield unit measurement due to the beam hardening artifact. No renal stone or hydronephrosis seen. Bladder is unremarkable. Stomach/Bowel: Bowel is normal in caliber. No bowel wall thickening or evidence of bowel wall inflammation seen. Some portions of the bowel are obscured by the aforementioned beam hardening artifact. Vascular/Lymphatic: Aortic atherosclerosis. No enlarged lymph nodes seen. Reproductive: Unremarkable. Other: No free fluid or abscess collections seen. No free intraperitoneal air. Elevation of the left hemidiaphragm, with upper stomach located posterior to the left ventricle. Musculoskeletal: Degenerative changes of the thoracolumbar spine, moderate in degree. No acute or  suspicious osseous finding. Ill-defined edema within the subcutaneous soft tissues of the abdomen and pelvis indicating anasarca. IMPRESSION: 1. No acute findings within the chest, abdomen or pelvis. 2. No anatomic variants identified. There is elevation of the left hemidiaphragm with stomach positioned posterior to the left ventricle. 3. Cardiomegaly. Diffuse coronary artery calcifications. No pericardial effusion seen. 4. Cholelithiasis without evidence of acute cholecystitis. 5. Aortic atherosclerosis. 6. Anasarca. 7. Mild study limitations detailed above. Electronically Signed   By: Bary Richard M.D.   On: 04/12/2017 17:30   Ct Chest Wo Contrast  Result Date: 04/12/2017 CLINICAL DATA:  Patient has chronic systolic CHF, being considered for LVAD EXAM: CT CHEST, ABDOMEN AND PELVIS WITHOUT CONTRAST TECHNIQUE: Multidetector CT imaging of the chest, abdomen and pelvis was performed following the standard protocol without IV contrast. COMPARISON:  None. FINDINGS: CT CHEST FINDINGS Cardiovascular: Thoracic aorta is normal in caliber and configuration. Scattered aortic atherosclerosis. Cardiomegaly. No pericardial effusion. Diffuse coronary artery calcifications. Status post CABG. Mediastinum/Nodes: No mass or lymphadenopathy. Esophagus is unremarkable. Trachea and central bronchi are unremarkable. Lungs/Pleura: Mild atelectasis/scarring and mild pleural thickening at each lung base. Lungs otherwise clear. Incidentally noted calcified  granuloma at the right lung apex. Musculoskeletal: Mild degenerative spurring in the lower thoracic spine. Degenerative changes at the shoulders. No acute or suspicious osseous finding. Status post median sternotomy. Edema within the subcutaneous soft tissues suggesting anasarca. Bilateral gynecomastia. CT ABDOMEN PELVIS FINDINGS Hepatobiliary: Stones within the nondistended gallbladder. No focal liver abnormality is seen. No bile duct dilatation seen. Pancreas: Partially obscured.   Visualized portions unremarkable. Spleen: Normal in size without focal abnormality. Adrenals/Urinary Tract: Beam hardening artifact obscures portions of each kidney probable renal cysts bilaterally, difficult to definitively evaluate with Hounsfield unit measurement due to the beam hardening artifact. No renal stone or hydronephrosis seen. Bladder is unremarkable. Stomach/Bowel: Bowel is normal in caliber. No bowel wall thickening or evidence of bowel wall inflammation seen. Some portions of the bowel are obscured by the aforementioned beam hardening artifact. Vascular/Lymphatic: Aortic atherosclerosis. No enlarged lymph nodes seen. Reproductive: Unremarkable. Other: No free fluid or abscess collections seen. No free intraperitoneal air. Elevation of the left hemidiaphragm, with upper stomach located posterior to the left ventricle. Musculoskeletal: Degenerative changes of the thoracolumbar spine, moderate in degree. No acute or suspicious osseous finding. Ill-defined edema within the subcutaneous soft tissues of the abdomen and pelvis indicating anasarca. IMPRESSION: 1. No acute findings within the chest, abdomen or pelvis. 2. No anatomic variants identified. There is elevation of the left hemidiaphragm with stomach positioned posterior to the left ventricle. 3. Cardiomegaly. Diffuse coronary artery calcifications. No pericardial effusion seen. 4. Cholelithiasis without evidence of acute cholecystitis. 5. Aortic atherosclerosis. 6. Anasarca. 7. Mild study limitations detailed above. Electronically Signed   By: Bary RichardStan  Maynard M.D.   On: 04/12/2017 17:30    PHYSICAL EXAM General: NAD Neck: JVP 8-9 cm, no thyromegaly or thyroid nodule.  Lungs: CTAB. CV: Lateral PMI.  Heart regular S1/S2, no S3/S4, no murmur.  Trace ankle edema.   Abdomen: Soft, nontender, no hepatosplenomegaly, no distention.  Neurologic: Alert and oriented x 3.  Psych: Normal affect. Extremities: No clubbing or cyanosis.   TELEMETRY:  Personally reviewed telemetry pt in NSR   ASSESSMENT AND PLAN: 56 yo with CAD s/p CABG, ischemic cardiomyopathy/chronic systolic CHF, tophaceous gout, and CKD stage 3 was admitted for diuresis and consideration for LVAD placement.  1. Acute/chronic systolic CHF: Ischemic cardiomyopathy.  St Jude ICD.  Echo (6/18) with EF 15%, mildly dilated RV with moderately decreased systolic function.  RHC 6/22 with elevated filling pressures and evidence of RV failure though PAPi score 1.95 so not prohibitive.  [facility] with severe tophaceous gout and concern for worsening on transplant meds.  He was diuresed extensively, repeat RHC 6/26 with marked improvement in filling pressures and stable cardiac output on milrinone.  Lasix gtt stopped, he remains on milrinone 0.25.  Co-ox 66%, CVP 7-8 this morning.  Creatinine with mild increase to 1.8.  - Continue milrinone 0.25.  - No torsemide this morning, will give 20 mg in the afternoon.  - Continue lower dose Coreg 6.25 mg bid.  - RHC with considerable improvement on milrinone and with diuresis.  Creatinine fairly stable.  I still have concerns about his right heart and renal function but think we have him optimized.  LVAD is going to be his only option for improvement at this point.  Dr. Laneta SimmersBartle to see this morning, tentative plan for LVAD placement tomorrow.   2. CKD stage 3: Creatinine mildly higher at 1.8.  Back off on diuresis with CVP 7-8.  3. Anemia:  Hgb 8.1, has been fairly stably low.  Patient denies signs of overt GI bleeding and FOBT negative.  May be component of anemia of renal disease/chronic disease.  Fe studies do not show marked Fe deficiency.  Seen by GI, they do not feel that he needs endoscopy.  4. CAD: s/p CABG 2012.  Stable, no chest pain.  5. Gout: Severe tophaceous gout.  On Uloric, will continue colchicine daily.  No pain currently.  6. Arrhythmias: PVCs, short runs AT noted on milrinone.  Quiescent  on amiodarone.  - Can decrease amiodarone to 200 mg bid.  7. Malnutrition: Addressing with nutritional supplementation.  He is eating all of his meals.   Marca Ancona 04/16/2017 8:52 AM

## 2017-04-16 NOTE — Progress Notes (Signed)
A user error has taken place: encounter opened in error, closed for administrative reasons.

## 2017-04-16 NOTE — Progress Notes (Signed)
Patient ID: James FabianHarry D Layton, male   DOB: 1961-02-25, 56 y.o.   MRN: [id number]  Cardiothoracic Surgery   Medical record, echo, right heart cath data reviewed and patient examined. He is known to me from prior CABG x 6 on 12/05/2010 at which time he had severe LV systolic dysfunction with an EF of 20-25%. He has been followed by the advanced heart failure team with ischemic cardiomyopathy with an EF of 15% by echo 6/18 now with acute on chronic systolic heart failure. He has been discussed previously at the VAD [facility] and [facility] but turned down due to severe tophaceous gout and concern for worsening on transplant meds. He was felt to be an acceptable candidate for LVAD as destination therapy. He was recently admitted with worsening heart failure and RHC on 6/22 showed elevated filling pressures with evidence of right heart failure. He was started on milrinone and diuresed aggressively and repeat RHC yesterday shows marked improvement in filling pressure and improved cardiac output with a Co-ox of 66% and reduction of CVP to 7-8. Renal function is fairly stable with creat 1.8 this am. I think he is in as good of shape as possible for LVAD surgery at this point and we plan to proceed tomorrow. I discussed the operative procedure with the patient and his wife including alternatives, benefits and risks; including but not limited to bleeding, blood transfusion, infection, stroke, myocardial infarction, RV failure requiring RVAD, organ dysfunction, and death. I also discussed the longer term risks of GI bleeding, pump thrombosis, drive line infection and pump malfunction.  James [name] understands and agrees to proceed.  We will schedule surgery for tomorrow.

## 2017-04-17 ENCOUNTER — Inpatient Hospital Stay (HOSPITAL_COMMUNITY): Payer: Medicare PPO

## 2017-04-17 ENCOUNTER — Inpatient Hospital Stay (HOSPITAL_COMMUNITY): Payer: Medicare PPO | Admitting: Certified Registered Nurse Anesthetist

## 2017-04-17 ENCOUNTER — Inpatient Hospital Stay (HOSPITAL_COMMUNITY): Admission: AD | Disposition: E | Payer: Self-pay | Source: Ambulatory Visit | Attending: Surgery

## 2017-04-17 ENCOUNTER — Encounter (HOSPITAL_COMMUNITY): Payer: Self-pay | Admitting: Anesthesiology

## 2017-04-17 DIAGNOSIS — I5023 Acute on chronic systolic (congestive) heart failure: Secondary | ICD-10-CM

## 2017-04-17 DIAGNOSIS — Z95811 Presence of heart assist device: Secondary | ICD-10-CM

## 2017-04-17 DIAGNOSIS — I9789 Other postprocedural complications and disorders of the circulatory system, not elsewhere classified: Secondary | ICD-10-CM

## 2017-04-17 HISTORY — PX: TEE WITHOUT CARDIOVERSION: SHX5443

## 2017-04-17 HISTORY — PX: INSERTION OF IMPLANTABLE LEFT VENTRICULAR ASSIST DEVICE: SHX5866

## 2017-04-17 LAB — BASIC METABOLIC PANEL
ANION GAP: 10 (ref 5–15)
ANION GAP: 7 (ref 5–15)
BUN: 39 mg/dL — AB (ref 6–20)
BUN: 35 mg/dL — ABNORMAL HIGH (ref 6–20)
CALCIUM: 8.1 mg/dL — AB (ref 8.9–10.3)
CALCIUM: 7.7 mg/dL — AB (ref 8.9–10.3)
CO2: 30 mmol/L (ref 22–32)
CO2: 28 mmol/L (ref 22–32)
Chloride: 95 mmol/L — ABNORMAL LOW (ref 101–111)
Chloride: 102 mmol/L (ref 101–111)
Creatinine, Ser: 1.9 mg/dL — ABNORMAL HIGH (ref 0.61–1.24)
Creatinine, Ser: 1.66 mg/dL — ABNORMAL HIGH (ref 0.61–1.24)
GFR calc Af Amer: 45 mL/min — ABNORMAL LOW (ref >60)
GFR calc non Af Amer: 38 mL/min — ABNORMAL LOW (ref >60)
GFR, EST AFRICAN AMERICAN: 52 mL/min — AB (ref >60)
GFR, EST NON AFRICAN AMERICAN: 45 mL/min — AB (ref >60)
GLUCOSE: 115 mg/dL — AB (ref 65–99)
Glucose, Bld: 78 mg/dL (ref 65–99)
Potassium: 4.6 mmol/L (ref 3.5–5.1)
Potassium: 4.1 mmol/L (ref 3.5–5.1)
SODIUM: 137 mmol/L (ref 135–145)
Sodium: 135 mmol/L (ref 135–145)

## 2017-04-17 LAB — POCT I-STAT 4, (NA,K, GLUC, HGB,HCT)
GLUCOSE: 136 mg/dL — AB (ref 65–99)
Glucose, Bld: 46 mg/dL — ABNORMAL LOW (ref 65–99)
HCT: 27 % — ABNORMAL LOW (ref 39.0–52.0)
HEMATOCRIT: 26 % — AB (ref 39.0–52.0)
HEMOGLOBIN: 8.8 g/dL — AB (ref 13.0–17.0)
Hemoglobin: 9.2 g/dL — ABNORMAL LOW (ref 13.0–17.0)
POTASSIUM: 4.8 mmol/L (ref 3.5–5.1)
Potassium: 4 mmol/L (ref 3.5–5.1)
SODIUM: 139 mmol/L (ref 135–145)
SODIUM: 137 mmol/L (ref 135–145)

## 2017-04-17 LAB — POCT I-STAT 3, ART BLOOD GAS (G3+)
ACID-BASE EXCESS: 7 mmol/L — AB (ref 0.0–2.0)
Acid-Base Excess: 6 mmol/L — ABNORMAL HIGH (ref 0.0–2.0)
Acid-Base Excess: 5 mmol/L — ABNORMAL HIGH (ref 0.0–2.0)
BICARBONATE: 30 mmol/L — AB (ref 20.0–28.0)
Bicarbonate: 30 mmol/L — ABNORMAL HIGH (ref 20.0–28.0)
Bicarbonate: 31.2 mmol/L — ABNORMAL HIGH (ref 20.0–28.0)
O2 SAT: 100 %
O2 Saturation: 100 %
O2 Saturation: 100 %
PCO2 ART: 38.5 mmHg (ref 32.0–48.0)
PCO2 ART: 43.3 mmHg (ref 32.0–48.0)
PCO2 ART: 37.9 mmHg (ref 32.0–48.0)
PH ART: 7.449 (ref 7.350–7.450)
PO2 ART: 201 mmHg — AB (ref 83.0–108.0)
PO2 ART: 506 mmHg — AB (ref 83.0–108.0)
Patient temperature: 35.6
TCO2: 31 mmol/L (ref 0–100)
TCO2: 31 mmol/L (ref 0–100)
TCO2: 32 mmol/L (ref 0–100)
pH, Arterial: 7.495 — ABNORMAL HIGH (ref 7.350–7.450)
pH, Arterial: 7.519 — ABNORMAL HIGH (ref 7.350–7.450)
pO2, Arterial: 449 mmHg — ABNORMAL HIGH (ref 83.0–108.0)

## 2017-04-17 LAB — COOXEMETRY PANEL
Carboxyhemoglobin: 1.1 % (ref 0.5–1.5)
Methemoglobin: 1.2 % (ref 0.0–1.5)
O2 SAT: 68.7 %
TOTAL HEMOGLOBIN: 7.9 g/dL — AB (ref 12.0–16.0)

## 2017-04-17 LAB — CBC
HEMATOCRIT: 24.2 % — AB (ref 39.0–52.0)
HEMATOCRIT: 26.4 % — AB (ref 39.0–52.0)
HEMATOCRIT: 27.7 % — AB (ref 39.0–52.0)
HEMOGLOBIN: 8.5 g/dL — AB (ref 13.0–17.0)
HEMOGLOBIN: 9.2 g/dL — AB (ref 13.0–17.0)
Hemoglobin: 7.6 g/dL — ABNORMAL LOW (ref 13.0–17.0)
MCH: 27.5 pg (ref 26.0–34.0)
MCH: 27.9 pg (ref 26.0–34.0)
MCH: 27.5 pg (ref 26.0–34.0)
MCHC: 31.4 g/dL (ref 30.0–36.0)
MCHC: 32.2 g/dL (ref 30.0–36.0)
MCHC: 33.2 g/dL (ref 30.0–36.0)
MCV: 87.7 fL (ref 78.0–100.0)
MCV: 86.6 fL (ref 78.0–100.0)
MCV: 82.9 fL (ref 78.0–100.0)
PLATELETS: 132 10*3/uL — AB (ref 150–400)
Platelets: 308 10*3/uL (ref 150–400)
Platelets: 186 10*3/uL (ref 150–400)
RBC: 2.76 MIL/uL — ABNORMAL LOW (ref 4.22–5.81)
RBC: 3.05 MIL/uL — AB (ref 4.22–5.81)
RBC: 3.34 MIL/uL — AB (ref 4.22–5.81)
RDW: 20.5 % — AB (ref 11.5–15.5)
RDW: 19.3 % — ABNORMAL HIGH (ref 11.5–15.5)
RDW: 17.4 % — ABNORMAL HIGH (ref 11.5–15.5)
WBC: 4 10*3/uL (ref 4.0–10.5)
WBC: 16.1 10*3/uL — ABNORMAL HIGH (ref 4.0–10.5)
WBC: 14.9 10*3/uL — AB (ref 4.0–10.5)

## 2017-04-17 LAB — FIBRINOGEN
Fibrinogen: 497 mg/dL — ABNORMAL HIGH (ref 210–475)
Fibrinogen: 384 mg/dL (ref 210–475)
Fibrinogen: 329 mg/dL (ref 210–475)

## 2017-04-17 LAB — POCT I-STAT, CHEM 8
BUN: 33 mg/dL — AB (ref 6–20)
BUN: 31 mg/dL — ABNORMAL HIGH (ref 6–20)
BUN: 32 mg/dL — ABNORMAL HIGH (ref 6–20)
CALCIUM ION: 1.11 mmol/L — AB (ref 1.15–1.40)
CALCIUM ION: 1.08 mmol/L — AB (ref 1.15–1.40)
CALCIUM ION: 1.01 mmol/L — AB (ref 1.15–1.40)
CHLORIDE: 97 mmol/L — AB (ref 101–111)
CHLORIDE: 97 mmol/L — AB (ref 101–111)
Chloride: 98 mmol/L — ABNORMAL LOW (ref 101–111)
Creatinine, Ser: 1.6 mg/dL — ABNORMAL HIGH (ref 0.61–1.24)
Creatinine, Ser: 1.4 mg/dL — ABNORMAL HIGH (ref 0.61–1.24)
Creatinine, Ser: 1.3 mg/dL — ABNORMAL HIGH (ref 0.61–1.24)
GLUCOSE: 115 mg/dL — AB (ref 65–99)
GLUCOSE: 126 mg/dL — AB (ref 65–99)
Glucose, Bld: 76 mg/dL (ref 65–99)
HCT: 25 % — ABNORMAL LOW (ref 39.0–52.0)
HCT: 23 % — ABNORMAL LOW (ref 39.0–52.0)
HEMATOCRIT: 24 % — AB (ref 39.0–52.0)
HEMOGLOBIN: 7.8 g/dL — AB (ref 13.0–17.0)
Hemoglobin: 8.2 g/dL — ABNORMAL LOW (ref 13.0–17.0)
Hemoglobin: 8.5 g/dL — ABNORMAL LOW (ref 13.0–17.0)
POTASSIUM: 4.7 mmol/L (ref 3.5–5.1)
Potassium: 4.1 mmol/L (ref 3.5–5.1)
Potassium: 4.9 mmol/L (ref 3.5–5.1)
SODIUM: 137 mmol/L (ref 135–145)
SODIUM: 136 mmol/L (ref 135–145)
SODIUM: 137 mmol/L (ref 135–145)
TCO2: 29 mmol/L (ref 0–100)
TCO2: 30 mmol/L (ref 0–100)
TCO2: 31 mmol/L (ref 0–100)

## 2017-04-17 LAB — BLOOD PRODUCT ORDER (VERBAL) VERIFICATION

## 2017-04-17 LAB — GLUCOSE, CAPILLARY
GLUCOSE-CAPILLARY: 80 mg/dL (ref 65–99)
Glucose-Capillary: 98 mg/dL (ref 65–99)
Glucose-Capillary: 80 mg/dL (ref 65–99)
Glucose-Capillary: 75 mg/dL (ref 65–99)
Glucose-Capillary: 152 mg/dL — ABNORMAL HIGH (ref 65–99)

## 2017-04-17 LAB — MAGNESIUM
Magnesium: 1.5 mg/dL — ABNORMAL LOW (ref 1.7–2.4)
Magnesium: 2.1 mg/dL (ref 1.7–2.4)

## 2017-04-17 LAB — HEMOGLOBIN AND HEMATOCRIT, BLOOD
HEMATOCRIT: 24.2 % — AB (ref 39.0–52.0)
Hemoglobin: 7.8 g/dL — ABNORMAL LOW (ref 13.0–17.0)

## 2017-04-17 LAB — PROTIME-INR
INR: 1.46
PROTHROMBIN TIME: 17.9 s — AB (ref 11.4–15.2)

## 2017-04-17 LAB — CREATININE, SERUM
Creatinine, Ser: 1.6 mg/dL — ABNORMAL HIGH (ref 0.61–1.24)
GFR, EST AFRICAN AMERICAN: 55 mL/min — AB (ref >60)
GFR, EST NON AFRICAN AMERICAN: 47 mL/min — AB (ref >60)

## 2017-04-17 LAB — PLATELET COUNT: Platelets: 299 10*3/uL (ref 150–400)

## 2017-04-17 LAB — PREPARE RBC (CROSSMATCH)

## 2017-04-17 LAB — APTT
APTT: 39 s — AB (ref 24–36)
APTT: 52 s — AB (ref 24–36)

## 2017-04-17 SURGERY — IRRIGATION AND DEBRIDEMENT OF STERNOCLAVICULAR JOINT-STERNUM AND RIBS
Anesthesia: General

## 2017-04-17 SURGERY — INSERTION OF IMPLANTABLE LEFT VENTRICULAR ASSIST DEVICE
Anesthesia: General | Site: Chest

## 2017-04-17 MED ORDER — ACETAMINOPHEN 500 MG PO TABS
1000.0000 mg | ORAL_TABLET | Freq: Four times a day (QID) | ORAL | Status: AC
  Administered 2017-04-18 – 2017-04-22 (×11): 1000 mg via ORAL
  Filled 2017-04-17 (×12): qty 2

## 2017-04-17 MED ORDER — SODIUM CHLORIDE 0.9% FLUSH: 3.0000 mL | Freq: Two times a day (BID) | INTRAVENOUS | Status: DC

## 2017-04-17 MED ORDER — ASPIRIN EC 325 MG PO TBEC
325.0000 mg | DELAYED_RELEASE_TABLET | Freq: Every day | ORAL | Status: DC
  Administered 2017-04-19 – 2017-05-01 (×10): 325 mg via ORAL
  Filled 2017-04-17 (×11): qty 1

## 2017-04-17 MED ORDER — MUPIROCIN 2 % EX OINT
1.0000 "application " | TOPICAL_OINTMENT | Freq: Two times a day (BID) | CUTANEOUS | Status: AC
  Administered 2017-04-17 – 2017-04-22 (×10): 1 via NASAL
  Filled 2017-04-17 (×2): qty 22

## 2017-04-17 MED ORDER — MAGNESIUM SULFATE 4 GM/100ML IV SOLN
4.0000 g | Freq: Once | INTRAVENOUS | Status: DC
  Filled 2017-04-17: qty 100

## 2017-04-17 MED ORDER — ONDANSETRON HCL 4 MG/2ML IJ SOLN
4.0000 mg | Freq: Four times a day (QID) | INTRAMUSCULAR | Status: DC | PRN
  Administered 2017-04-20 – 2017-05-26 (×5): 4 mg via INTRAVENOUS
  Filled 2017-04-17 (×6): qty 2

## 2017-04-17 MED ORDER — SODIUM CHLORIDE 0.9% FLUSH: 3.0000 mL | INTRAVENOUS | Status: DC | PRN

## 2017-04-17 MED ORDER — SODIUM CHLORIDE 0.9 % IV SOLN: Freq: Once | INTRAVENOUS | Status: DC

## 2017-04-17 MED ORDER — MIDAZOLAM HCL 5 MG/5ML IJ SOLN
INTRAMUSCULAR | Status: DC | PRN
  Administered 2017-04-17 (×2): 1 mg via INTRAVENOUS
  Administered 2017-04-17 (×2): 2 mg via INTRAVENOUS

## 2017-04-17 MED ORDER — MIDAZOLAM HCL 2 MG/2ML IJ SOLN
2.0000 mg | INTRAMUSCULAR | Status: DC | PRN
  Administered 2017-04-18 (×3): 2 mg via INTRAVENOUS
  Filled 2017-04-17 (×3): qty 2

## 2017-04-17 MED ORDER — LACTATED RINGERS IV SOLN: INTRAVENOUS | Status: DC

## 2017-04-17 MED ORDER — THROMBIN 20000 UNITS EX SOLR
OROMUCOSAL | Status: DC | PRN
  Administered 2017-04-17: 4 mL via TOPICAL

## 2017-04-17 MED ORDER — FENTANYL CITRATE (PF) 250 MCG/5ML IJ SOLN
INTRAMUSCULAR | Status: AC
  Filled 2017-04-17: qty 10

## 2017-04-17 MED ORDER — THROMBIN 20000 UNITS EX SOLR
CUTANEOUS | Status: DC | PRN
  Administered 2017-04-17: 4 mL via TOPICAL

## 2017-04-17 MED ORDER — MILRINONE LACTATE IN DEXTROSE 20-5 MG/100ML-% IV SOLN
INTRAVENOUS | Status: AC
  Filled 2017-04-17: qty 100

## 2017-04-17 MED ORDER — MIDAZOLAM HCL 2 MG/2ML IJ SOLN
INTRAMUSCULAR | Status: AC
  Filled 2017-04-17: qty 2

## 2017-04-17 MED ORDER — HEPARIN SODIUM (PORCINE) 1000 UNIT/ML IJ SOLN
INTRAMUSCULAR | Status: AC
  Filled 2017-04-17: qty 1

## 2017-04-17 MED ORDER — LACTATED RINGERS IV SOLN
INTRAVENOUS | Status: DC | PRN
  Administered 2017-04-17: 19:00:00 via INTRAVENOUS

## 2017-04-17 MED ORDER — PROTAMINE SULFATE 10 MG/ML IV SOLN
INTRAVENOUS | Status: DC | PRN
  Administered 2017-04-17: 270 mg via INTRAVENOUS
  Administered 2017-04-17: 10 mg via INTRAVENOUS

## 2017-04-17 MED ORDER — SODIUM CHLORIDE 0.9 % IJ SOLN
OROMUCOSAL | Status: DC | PRN
  Administered 2017-04-17: 4 mL via TOPICAL

## 2017-04-17 MED ORDER — FAMOTIDINE IN NACL 20-0.9 MG/50ML-% IV SOLN
20.0000 mg | Freq: Two times a day (BID) | INTRAVENOUS | Status: AC
  Administered 2017-04-17 (×2): 20 mg via INTRAVENOUS
  Filled 2017-04-17: qty 50

## 2017-04-17 MED ORDER — DEXTROSE 5 % IV SOLN
1.5000 g | Freq: Two times a day (BID) | INTRAVENOUS | Status: AC
  Administered 2017-04-17 – 2017-04-19 (×4): 1.5 g via INTRAVENOUS
  Filled 2017-04-17 (×4): qty 1.5

## 2017-04-17 MED ORDER — SODIUM CHLORIDE 0.45 % IV SOLN
INTRAVENOUS | Status: DC | PRN
  Administered 2017-04-17: 16:00:00 via INTRAVENOUS
  Administered 2017-04-18: 20 mL/h via INTRAVENOUS

## 2017-04-17 MED ORDER — PANTOPRAZOLE SODIUM 40 MG PO TBEC
40.0000 mg | DELAYED_RELEASE_TABLET | Freq: Every day | ORAL | Status: DC
  Administered 2017-04-19 – 2017-04-23 (×5): 40 mg via ORAL
  Filled 2017-04-17 (×5): qty 1

## 2017-04-17 MED ORDER — ACETAMINOPHEN 160 MG/5ML PO SOLN
1000.0000 mg | Freq: Four times a day (QID) | ORAL | Status: AC
  Administered 2017-04-18 – 2017-04-20 (×6): 1000 mg
  Filled 2017-04-17 (×7): qty 40.6

## 2017-04-17 MED ORDER — LACTATED RINGERS IV SOLN
INTRAVENOUS | Status: DC | PRN
  Administered 2017-04-17: 13:00:00 via INTRAVENOUS

## 2017-04-17 MED ORDER — 0.9 % SODIUM CHLORIDE (POUR BTL) OPTIME
TOPICAL | Status: DC | PRN
  Administered 2017-04-17: 3000 mL

## 2017-04-17 MED ORDER — BISACODYL 5 MG PO TBEC
10.0000 mg | DELAYED_RELEASE_TABLET | Freq: Every day | ORAL | Status: DC
  Administered 2017-04-19 – 2017-04-22 (×3): 10 mg via ORAL
  Filled 2017-04-17 (×5): qty 2

## 2017-04-17 MED ORDER — FENTANYL CITRATE (PF) 100 MCG/2ML IJ SOLN
INTRAMUSCULAR | Status: DC | PRN
  Administered 2017-04-17: 100 ug via INTRAVENOUS
  Administered 2017-04-17: 50 ug via INTRAVENOUS
  Administered 2017-04-17: 200 ug via INTRAVENOUS
  Administered 2017-04-17 (×3): 50 ug via INTRAVENOUS
  Administered 2017-04-17: 250 ug via INTRAVENOUS
  Administered 2017-04-17: 50 ug via INTRAVENOUS
  Administered 2017-04-17: 200 ug via INTRAVENOUS

## 2017-04-17 MED ORDER — ROCURONIUM BROMIDE 10 MG/ML (PF) SYRINGE
PREFILLED_SYRINGE | INTRAVENOUS | Status: AC
  Filled 2017-04-17: qty 10

## 2017-04-17 MED ORDER — FENTANYL CITRATE (PF) 250 MCG/5ML IJ SOLN
INTRAMUSCULAR | Status: DC | PRN
  Administered 2017-04-17 (×2): 50 ug via INTRAVENOUS

## 2017-04-17 MED ORDER — 0.9 % SODIUM CHLORIDE (POUR BTL) OPTIME
TOPICAL | Status: DC | PRN
  Administered 2017-04-17: 5000 mL

## 2017-04-17 MED ORDER — ACETAMINOPHEN 650 MG RE SUPP
650.0000 mg | Freq: Once | RECTAL | Status: AC
  Administered 2017-04-17: 650 mg via RECTAL

## 2017-04-17 MED ORDER — ASPIRIN 300 MG RE SUPP
300.0000 mg | Freq: Every day | RECTAL | Status: DC
  Administered 2017-04-24 – 2017-04-26 (×3): 300 mg via RECTAL
  Filled 2017-04-17 (×3): qty 1

## 2017-04-17 MED ORDER — LACTATED RINGERS IV SOLN: 500.0000 mL | Freq: Once | INTRAVENOUS | Status: DC | PRN

## 2017-04-17 MED ORDER — SODIUM CHLORIDE 0.9 % IV SOLN
INTRAVENOUS | Status: AC
  Filled 2017-04-17: qty 1

## 2017-04-17 MED ORDER — EPINEPHRINE PF 1 MG/ML IJ SOLN
0.0000 ug/min | INTRAVENOUS | Status: DC
  Administered 2017-04-19 – 2017-04-20 (×2): 3 ug/min via INTRAVENOUS
  Filled 2017-04-17 (×3): qty 4

## 2017-04-17 MED ORDER — ROCURONIUM BROMIDE 10 MG/ML (PF) SYRINGE
PREFILLED_SYRINGE | INTRAVENOUS | Status: AC
  Filled 2017-04-17: qty 5

## 2017-04-17 MED ORDER — METOCLOPRAMIDE HCL 5 MG/ML IJ SOLN
10.0000 mg | Freq: Four times a day (QID) | INTRAMUSCULAR | Status: AC
  Administered 2017-04-18 – 2017-04-22 (×17): 10 mg via INTRAVENOUS
  Filled 2017-04-17 (×17): qty 2

## 2017-04-17 MED ORDER — INSULIN REGULAR BOLUS VIA INFUSION
0.0000 [IU] | Freq: Three times a day (TID) | INTRAVENOUS | Status: DC
Start: 2017-04-17 — End: 2017-04-18
  Filled 2017-04-17: qty 10

## 2017-04-17 MED ORDER — PROPOFOL 10 MG/ML IV BOLUS
INTRAVENOUS | Status: DC | PRN
  Administered 2017-04-17 (×2): 10 mg via INTRAVENOUS
  Administered 2017-04-17: 20 mg via INTRAVENOUS
  Administered 2017-04-17 (×2): 10 mg via INTRAVENOUS

## 2017-04-17 MED ORDER — ETOMIDATE 2 MG/ML IV SOLN
INTRAVENOUS | Status: DC | PRN
  Administered 2017-04-17: 12 mg via INTRAVENOUS

## 2017-04-17 MED ORDER — HEPARIN SODIUM (PORCINE) 1000 UNIT/ML IJ SOLN
INTRAMUSCULAR | Status: DC | PRN
  Administered 2017-04-17: 31000 [IU] via INTRAVENOUS

## 2017-04-17 MED ORDER — DOCUSATE SODIUM 100 MG PO CAPS
200.0000 mg | ORAL_CAPSULE | Freq: Every day | ORAL | Status: DC
  Administered 2017-04-19 – 2017-04-22 (×4): 200 mg via ORAL
  Filled 2017-04-17 (×4): qty 2

## 2017-04-17 MED ORDER — FLUCONAZOLE IN SODIUM CHLORIDE 400-0.9 MG/200ML-% IV SOLN
400.0000 mg | Freq: Once | INTRAVENOUS | Status: AC
  Administered 2017-04-18: 400 mg via INTRAVENOUS
  Filled 2017-04-17: qty 200

## 2017-04-17 MED ORDER — CHLORHEXIDINE GLUCONATE 0.12% ORAL RINSE (MEDLINE KIT)
15.0000 mL | Freq: Two times a day (BID) | OROMUCOSAL | Status: DC
  Administered 2017-04-17 – 2017-04-19 (×4): 15 mL via OROMUCOSAL

## 2017-04-17 MED ORDER — PROPOFOL 10 MG/ML IV BOLUS
INTRAVENOUS | Status: AC
  Filled 2017-04-17: qty 20

## 2017-04-17 MED ORDER — MORPHINE SULFATE (PF) 4 MG/ML IV SOLN: 1.0000 mg | INTRAVENOUS | Status: AC | PRN

## 2017-04-17 MED ORDER — TRAMADOL HCL 50 MG PO TABS
50.0000 mg | ORAL_TABLET | ORAL | Status: DC | PRN
  Administered 2017-05-25: 100 mg via ORAL
  Filled 2017-04-17: qty 2

## 2017-04-17 MED ORDER — SODIUM CHLORIDE 0.9 % IV SOLN
250.0000 mL | INTRAVENOUS | Status: DC
  Administered 2017-04-18: 250 mL via INTRAVENOUS

## 2017-04-17 MED ORDER — LACTATED RINGERS IV SOLN
INTRAVENOUS | Status: DC | PRN
  Administered 2017-04-17 (×2): via INTRAVENOUS

## 2017-04-17 MED ORDER — NOREPINEPHRINE BITARTRATE 1 MG/ML IV SOLN
0.0000 ug/min | INTRAVENOUS | Status: DC
  Administered 2017-04-18: 9 ug/min via INTRAVENOUS
  Administered 2017-04-18: 8 ug/min via INTRAVENOUS
  Administered 2017-04-19: 4 ug/min via INTRAVENOUS
  Filled 2017-04-17 (×4): qty 4

## 2017-04-17 MED ORDER — RIFAMPIN 300 MG PO CAPS
600.0000 mg | ORAL_CAPSULE | Freq: Once | ORAL | Status: AC
  Administered 2017-04-18: 600 mg via ORAL
  Filled 2017-04-17: qty 2

## 2017-04-17 MED ORDER — VANCOMYCIN HCL IN DEXTROSE 750-5 MG/150ML-% IV SOLN
750.0000 mg | Freq: Two times a day (BID) | INTRAVENOUS | Status: AC
  Administered 2017-04-18 – 2017-04-19 (×3): 750 mg via INTRAVENOUS
  Filled 2017-04-17 (×5): qty 150

## 2017-04-17 MED ORDER — ALBUMIN HUMAN 5 % IV SOLN
INTRAVENOUS | Status: DC | PRN
  Administered 2017-04-17 (×2): via INTRAVENOUS

## 2017-04-17 MED ORDER — LACTATED RINGERS IV SOLN
INTRAVENOUS | Status: DC
  Administered 2017-04-17: 16:00:00 via INTRAVENOUS
  Administered 2017-04-20: 20 mL/h via INTRAVENOUS
  Administered 2017-04-28: 10 mL/h via INTRAVENOUS

## 2017-04-17 MED ORDER — HEMOSTATIC AGENTS (NO CHARGE) OPTIME
TOPICAL | Status: DC | PRN
  Administered 2017-04-17: 1 via TOPICAL

## 2017-04-17 MED ORDER — PROTAMINE SULFATE 10 MG/ML IV SOLN
INTRAVENOUS | Status: AC
  Filled 2017-04-17: qty 25

## 2017-04-17 MED ORDER — MILRINONE LACTATE IN DEXTROSE 20-5 MG/100ML-% IV SOLN
0.3000 ug/kg/min | INTRAVENOUS | Status: DC
  Administered 2017-04-18 – 2017-04-20 (×6): 0.3 ug/kg/min via INTRAVENOUS
  Filled 2017-04-17 (×5): qty 100

## 2017-04-17 MED ORDER — LACTATED RINGERS IV SOLN
INTRAVENOUS | Status: DC | PRN
  Administered 2017-04-17: 08:00:00 via INTRAVENOUS

## 2017-04-17 MED ORDER — DEXTROSE 50 % IV SOLN
INTRAVENOUS | Status: AC
  Filled 2017-04-17: qty 50

## 2017-04-17 MED ORDER — DEXMEDETOMIDINE HCL IN NACL 400 MCG/100ML IV SOLN
0.0000 ug/kg/h | INTRAVENOUS | Status: DC
  Administered 2017-04-18 (×2): 0.7 ug/kg/h via INTRAVENOUS
  Filled 2017-04-17 (×3): qty 100

## 2017-04-17 MED ORDER — DEXTROSE 50 % IV SOLN
22.0000 mL | Freq: Once | INTRAVENOUS | Status: AC
  Administered 2017-04-17: 22 mL via INTRAVENOUS

## 2017-04-17 MED ORDER — ROCURONIUM BROMIDE 10 MG/ML (PF) SYRINGE
PREFILLED_SYRINGE | INTRAVENOUS | Status: DC | PRN
  Administered 2017-04-17 (×4): 50 mg via INTRAVENOUS
  Administered 2017-04-17: 100 mg via INTRAVENOUS

## 2017-04-17 MED ORDER — ALBUMIN HUMAN 5 % IV SOLN
250.0000 mL | INTRAVENOUS | Status: AC | PRN
  Administered 2017-04-17 – 2017-04-18 (×3): 250 mL via INTRAVENOUS
  Filled 2017-04-17: qty 250

## 2017-04-17 MED ORDER — SODIUM CHLORIDE 0.9 % IV SOLN
0.1000 ug/kg/h | INTRAVENOUS | Status: DC
  Filled 2017-04-17: qty 2

## 2017-04-17 MED ORDER — BISACODYL 10 MG RE SUPP
10.0000 mg | Freq: Every day | RECTAL | Status: DC
  Administered 2017-04-24 – 2017-04-26 (×3): 10 mg via RECTAL
  Filled 2017-04-17 (×3): qty 1

## 2017-04-17 MED ORDER — ROCURONIUM BROMIDE 10 MG/ML (PF) SYRINGE
PREFILLED_SYRINGE | INTRAVENOUS | Status: DC | PRN
  Administered 2017-04-17: 50 mg via INTRAVENOUS

## 2017-04-17 MED ORDER — ASPIRIN 81 MG PO CHEW
324.0000 mg | CHEWABLE_TABLET | Freq: Every day | ORAL | Status: DC
  Administered 2017-04-18: 324 mg
  Filled 2017-04-17: qty 4

## 2017-04-17 MED ORDER — HEMOSTATIC AGENTS (NO CHARGE) OPTIME
TOPICAL | Status: DC | PRN
  Administered 2017-04-17 (×2): 1 via TOPICAL

## 2017-04-17 MED ORDER — POTASSIUM CHLORIDE 10 MEQ/50ML IV SOLN: 10.0000 meq | INTRAVENOUS | Status: AC

## 2017-04-17 MED ORDER — THROMBIN 20000 UNITS EX SOLR
CUTANEOUS | Status: AC
  Filled 2017-04-17: qty 20000

## 2017-04-17 MED ORDER — MIDAZOLAM HCL 5 MG/5ML IJ SOLN
INTRAMUSCULAR | Status: DC | PRN
  Administered 2017-04-17 (×2): 2 mg via INTRAVENOUS

## 2017-04-17 MED ORDER — OXYCODONE HCL 5 MG PO TABS
5.0000 mg | ORAL_TABLET | ORAL | Status: DC | PRN
  Administered 2017-04-19: 5 mg via ORAL
  Administered 2017-04-19: 10 mg via ORAL
  Administered 2017-05-01 – 2017-05-09 (×4): 5 mg via ORAL
  Administered 2017-05-13 – 2017-05-18 (×4): 10 mg via ORAL
  Filled 2017-04-17: qty 2
  Filled 2017-04-17 (×3): qty 1
  Filled 2017-04-17 (×4): qty 2
  Filled 2017-04-17: qty 1
  Filled 2017-04-17: qty 2

## 2017-04-17 MED ORDER — SODIUM CHLORIDE 0.9 % IV SOLN
INTRAVENOUS | Status: DC
  Administered 2017-04-21: 15:00:00 via INTRAVENOUS
  Administered 2017-05-11 – 2017-05-13 (×2): 10 mL/h via INTRAVENOUS

## 2017-04-17 MED ORDER — MIDAZOLAM HCL 10 MG/2ML IJ SOLN
INTRAMUSCULAR | Status: AC
  Filled 2017-04-17: qty 2

## 2017-04-17 MED ORDER — ORAL CARE MOUTH RINSE
15.0000 mL | OROMUCOSAL | Status: DC
  Administered 2017-04-17 – 2017-04-19 (×17): 15 mL via OROMUCOSAL

## 2017-04-17 MED ORDER — ACETAMINOPHEN 160 MG/5ML PO SOLN
650.0000 mg | Freq: Once | ORAL | Status: AC
Start: 2017-04-17 — End: 2017-04-17

## 2017-04-17 MED ORDER — VANCOMYCIN HCL IN DEXTROSE 1-5 GM/200ML-% IV SOLN
1000.0000 mg | Freq: Two times a day (BID) | INTRAVENOUS | Status: DC
  Filled 2017-04-17: qty 200

## 2017-04-17 MED ORDER — VANCOMYCIN HCL 1000 MG IV SOLR
INTRAVENOUS | Status: AC
  Administered 2017-04-17: 1000 mL
  Filled 2017-04-17: qty 1000

## 2017-04-17 MED ORDER — SODIUM CHLORIDE 0.9 % IV SOLN
INTRAVENOUS | Status: DC | PRN
  Administered 2017-04-17: 20:00:00 via INTRAVENOUS

## 2017-04-17 MED ORDER — CHLORHEXIDINE GLUCONATE CLOTH 2 % EX PADS
6.0000 | MEDICATED_PAD | Freq: Every day | CUTANEOUS | Status: AC
  Administered 2017-04-18 – 2017-04-22 (×5): 6 via TOPICAL

## 2017-04-17 MED ORDER — ETOMIDATE 2 MG/ML IV SOLN
INTRAVENOUS | Status: AC
  Filled 2017-04-17: qty 10

## 2017-04-17 MED ORDER — CHLORHEXIDINE GLUCONATE 0.12 % MT SOLN
15.0000 mL | OROMUCOSAL | Status: AC
  Administered 2017-04-17: 15 mL via OROMUCOSAL

## 2017-04-17 MED ORDER — MORPHINE SULFATE (PF) 4 MG/ML IV SOLN
2.0000 mg | INTRAVENOUS | Status: DC | PRN
  Administered 2017-04-18 (×4): 4 mg via INTRAVENOUS
  Administered 2017-04-18 – 2017-04-19 (×3): 2 mg via INTRAVENOUS
  Administered 2017-04-19 – 2017-04-20 (×2): 4 mg via INTRAVENOUS
  Administered 2017-04-23 (×2): 2 mg via INTRAVENOUS
  Filled 2017-04-17 (×11): qty 1

## 2017-04-17 MED FILL — Magnesium Sulfate Inj 50%: INTRAMUSCULAR | Qty: 10 | Status: AC

## 2017-04-17 MED FILL — Potassium Chloride Inj 2 mEq/ML: INTRAVENOUS | Qty: 10 | Status: AC

## 2017-04-17 MED FILL — Heparin Sodium (Porcine) Inj 1000 Unit/ML: INTRAMUSCULAR | Qty: 30 | Status: AC

## 2017-04-17 SURGICAL SUPPLY — 99 items
ADAPTER CARDIO PERF ANTE/RETRO (ADAPTER) IMPLANT
ADAPTER DLP PERFUSION .25INX2I (MISCELLANEOUS) ×3 IMPLANT
APPLICATOR CHLORAPREP 3ML ORNG (MISCELLANEOUS) ×3 IMPLANT
ATTRACTOMAT 16X20 MAGNETIC DRP (DRAPES) ×3 IMPLANT
BAG DECANTER FOR FLEXI CONT (MISCELLANEOUS) ×9 IMPLANT
BLADE STERNUM SYSTEM 6 (BLADE) IMPLANT
BLADE SURG 10 STRL SS (BLADE) IMPLANT
BLADE SURG 12 STRL SS (BLADE) IMPLANT
BLADE SURG 15 STRL LF DISP TIS (BLADE) IMPLANT
BLADE SURG 15 STRL SS (BLADE)
CANISTER SUCT 3000ML PPV (MISCELLANEOUS) ×3 IMPLANT
CANNULA ARTERIAL NVNT 3/8 20FR (MISCELLANEOUS) IMPLANT
CANNULA VENOUS LOW PROF 34X46 (CANNULA) IMPLANT
CATH CPB KIT VANTRIGT (MISCELLANEOUS) IMPLANT
CATH FOLEY 2WAY SLVR  5CC 14FR (CATHETERS)
CATH FOLEY 2WAY SLVR 5CC 14FR (CATHETERS) IMPLANT
CATH HEART VENT LEFT (CATHETERS) IMPLANT
CATH HYDRAGLIDE XL THORACIC (CATHETERS) IMPLANT
CATH ROBINSON RED A/P 18FR (CATHETERS) ×6 IMPLANT
CATH THORACIC 28FR (CATHETERS) ×6 IMPLANT
CATH THORACIC 36FR RT ANG (CATHETERS) ×3 IMPLANT
CHLORAPREP W/TINT 26ML (MISCELLANEOUS) ×3 IMPLANT
CONN ST 1/4X3/8  BEN (MISCELLANEOUS)
CONN ST 1/4X3/8 BEN (MISCELLANEOUS) IMPLANT
COVER SURGICAL LIGHT HANDLE (MISCELLANEOUS) ×6 IMPLANT
CRADLE DONUT ADULT HEAD (MISCELLANEOUS) ×3 IMPLANT
DRAIN CHANNEL 28F RND 3/8 FF (WOUND CARE) IMPLANT
DRAIN CHANNEL 32F RND 10.7 FF (WOUND CARE) IMPLANT
DRAPE BILATERAL SPLIT (DRAPES) ×3 IMPLANT
DRAPE INCISE IOBAN 66X45 STRL (DRAPES) ×6 IMPLANT
DRAPE SLUSH/WARMER DISC (DRAPES) ×3 IMPLANT
DRSG COVADERM 4X14 (GAUZE/BANDAGES/DRESSINGS) ×3 IMPLANT
ELECT BLADE 4.0 EZ CLEAN MEGAD (MISCELLANEOUS) ×3
ELECT BLADE 6.5 EXT (BLADE) IMPLANT
ELECT CAUTERY BLADE 6.4 (BLADE) ×3 IMPLANT
ELECT PAD GROUND ADT 9 (MISCELLANEOUS) ×6 IMPLANT
ELECT REM PT RETURN 9FT ADLT (ELECTROSURGICAL) ×6
ELECTRODE BLDE 4.0 EZ CLN MEGD (MISCELLANEOUS) ×2 IMPLANT
ELECTRODE REM PT RTRN 9FT ADLT (ELECTROSURGICAL) ×4 IMPLANT
FELT TEFLON 6X6 (MISCELLANEOUS) ×3 IMPLANT
GAUZE SPONGE 4X4 12PLY STRL (GAUZE/BANDAGES/DRESSINGS) ×6 IMPLANT
GAUZE SPONGE 4X4 12PLY STRL LF (GAUZE/BANDAGES/DRESSINGS) ×3 IMPLANT
GLOVE BIOGEL M 6.5 STRL (GLOVE) ×12 IMPLANT
GOWN STRL REUS W/ TWL LRG LVL3 (GOWN DISPOSABLE) ×8 IMPLANT
GOWN STRL REUS W/ TWL XL LVL3 (GOWN DISPOSABLE) ×4 IMPLANT
GOWN STRL REUS W/TWL LRG LVL3 (GOWN DISPOSABLE) ×4
GOWN STRL REUS W/TWL XL LVL3 (GOWN DISPOSABLE) ×2
HEMOSTAT POWDER SURGIFOAM 1G (HEMOSTASIS) ×12 IMPLANT
HEMOSTAT SURGICEL 2X14 (HEMOSTASIS) ×3 IMPLANT
INSERT FOGARTY XLG (MISCELLANEOUS) IMPLANT
KIT BASIN OR (CUSTOM PROCEDURE TRAY) ×3 IMPLANT
KIT HVAD HEARTWARE W-CTRL (Prosthesis & Implant Heart) ×3 IMPLANT
KIT ROOM TURNOVER OR (KITS) ×3 IMPLANT
KIT SUCTION CATH 14FR (SUCTIONS) ×3 IMPLANT
LEAD PACING MYOCARDI (MISCELLANEOUS) IMPLANT
LINE VENT (MISCELLANEOUS) ×3 IMPLANT
NS IRRIG 1000ML POUR BTL (IV SOLUTION) ×15 IMPLANT
PACK OPEN HEART (CUSTOM PROCEDURE TRAY) ×3 IMPLANT
PAD ARMBOARD 7.5X6 YLW CONV (MISCELLANEOUS) ×6 IMPLANT
PAD DEFIB R2 (MISCELLANEOUS) ×3 IMPLANT
PUNCH AORTIC ROTATE 4.5MM 8IN (MISCELLANEOUS) ×3 IMPLANT
SEALANT SURG COSEAL 8ML (VASCULAR PRODUCTS) ×3 IMPLANT
SHEATH AVANTI 11CM 5FR (MISCELLANEOUS) IMPLANT
STOPCOCK 4 WAY LG BORE MALE ST (IV SETS) ×3 IMPLANT
SUCKER INTRACARDIAC WEIGHTED (SUCKER) ×3 IMPLANT
SUT ETHIBOND 2 0 SH (SUTURE) ×5
SUT ETHIBOND 2 0 SH 36X2 (SUTURE) ×10 IMPLANT
SUT ETHIBOND 5 LR DA (SUTURE) IMPLANT
SUT ETHIBOND NAB MH 2-0 36IN (SUTURE) ×36 IMPLANT
SUT PROLENE 3 0 SH DA (SUTURE) IMPLANT
SUT PROLENE 4 0 RB 1 (SUTURE) ×2
SUT PROLENE 4-0 RB1 .5 CRCL 36 (SUTURE) ×4 IMPLANT
SUT PROLENE 5 0 C1 (SUTURE) ×6 IMPLANT
SUT SILK  1 MH (SUTURE) ×4
SUT SILK 1 MH (SUTURE) ×8 IMPLANT
SUT SILK 1 TIES 10X30 (SUTURE) ×3 IMPLANT
SUT SILK 2 0 SH CR/8 (SUTURE) ×6 IMPLANT
SUT STEEL 6MS V (SUTURE) ×6 IMPLANT
SUT STEEL SZ 6 DBL 3X14 BALL (SUTURE) ×9 IMPLANT
SUT TEM PAC WIRE 2 0 SH (SUTURE) IMPLANT
SUT VIC AB 1 CTX 36 (SUTURE) ×2
SUT VIC AB 1 CTX36XBRD ANBCTR (SUTURE) ×4 IMPLANT
SUT VIC AB 2-0 CTX 27 (SUTURE) ×6 IMPLANT
SUT VIC AB 3-0 SH 8-18 (SUTURE) IMPLANT
SUT VIC AB 3-0 X1 27 (SUTURE) ×6 IMPLANT
SUT VICRYL 2 TP 1 (SUTURE) IMPLANT
SYR 50ML LL SCALE MARK (SYRINGE) ×3 IMPLANT
SYSTEM SAHARA CHEST DRAIN ATS (WOUND CARE) ×3 IMPLANT
TAPE CLOTH SURG 4X10 WHT LF (GAUZE/BANDAGES/DRESSINGS) ×3 IMPLANT
TAPE PAPER 3X10 WHT MICROPORE (GAUZE/BANDAGES/DRESSINGS) ×3 IMPLANT
TOWEL OR 17X26 10 PK STRL BLUE (TOWEL DISPOSABLE) ×9 IMPLANT
TRAY CATH LUMEN 1 20CM STRL (SET/KITS/TRAYS/PACK) ×6 IMPLANT
TRAY FOLEY SILVER 14FR TEMP (SET/KITS/TRAYS/PACK) IMPLANT
TUBE CONNECTING 12X1/4 (SUCTIONS) ×3 IMPLANT
TUBING ART PRESS 48 MALE/FEM (TUBING) ×6 IMPLANT
UNDERPAD 30X30 (UNDERPADS AND DIAPERS) ×3 IMPLANT
VENT LEFT HEART 12002 (CATHETERS)
WATER STERILE IRR 1000ML POUR (IV SOLUTION) ×6 IMPLANT
YANKAUER SUCT BULB TIP NO VENT (SUCTIONS) ×3 IMPLANT

## 2017-04-17 SURGICAL SUPPLY — 43 items
CANISTER SUCTION 2500CC (MISCELLANEOUS) ×6 IMPLANT
CLIP TI WIDE RED SMALL 24 (CLIP) ×6 IMPLANT
DRAPE CARDIOVASCULAR INCISE (DRAPES) ×2
DRAPE SLUSH/WARMER DISC (DRAPES) ×3 IMPLANT
DRAPE SRG 135X102X78XABS (DRAPES) ×1 IMPLANT
DRSG AQUACEL AG ADV 3.5X14 (GAUZE/BANDAGES/DRESSINGS) ×3 IMPLANT
ELECT BLADE 6.5 EXT (BLADE) ×3 IMPLANT
GAUZE SPONGE 4X4 12PLY STRL (GAUZE/BANDAGES/DRESSINGS) ×3 IMPLANT
GLOVE BIOGEL M 6.5 STRL (GLOVE) ×3 IMPLANT
GLOVE BIOGEL M STRL SZ7.5 (GLOVE) ×6 IMPLANT
GLOVE BIOGEL PI IND STRL 6 (GLOVE) ×1 IMPLANT
GLOVE BIOGEL PI IND STRL 6.5 (GLOVE) ×2 IMPLANT
GLOVE BIOGEL PI INDICATOR 6 (GLOVE) ×2
GLOVE BIOGEL PI INDICATOR 6.5 (GLOVE) ×4
GLOVE SURG SIGNA 7.5 PF LTX (GLOVE) ×6 IMPLANT
GOWN STRL REUS W/ TWL LRG LVL3 (GOWN DISPOSABLE) ×6 IMPLANT
GOWN STRL REUS W/TWL LRG LVL3 (GOWN DISPOSABLE) ×12
HEMOSTAT POWDER SURGIFOAM 1G (HEMOSTASIS) ×9 IMPLANT
HEMOSTAT SURGICEL 2X14 (HEMOSTASIS) ×3 IMPLANT
KIT SUCTION CATH 14FR (SUCTIONS) ×3 IMPLANT
NS IRRIG 1000ML POUR BTL (IV SOLUTION) ×9 IMPLANT
PACK OPEN HEART (CUSTOM PROCEDURE TRAY) ×3 IMPLANT
POWDER SURGICEL 3.0 GRAM (HEMOSTASIS) ×6 IMPLANT
SUT ETHIBOND NAB MH 2-0 36IN (SUTURE) ×3 IMPLANT
SUT PROLENE 3 0 SH1 36 (SUTURE) ×3 IMPLANT
SUT PROLENE 4 0 RB 1 (SUTURE) ×4
SUT PROLENE 4-0 RB1 .5 CRCL 36 (SUTURE) ×2 IMPLANT
SUT PROLENE 6 0 C 1 30 (SUTURE) ×3 IMPLANT
SUT SILK 1 TIES 10X30 (SUTURE) ×3 IMPLANT
SUT SILK 2 0 SH CR/8 (SUTURE) ×3 IMPLANT
SUT SILK 2 0 TIES 10X30 (SUTURE) ×3 IMPLANT
SUT SILK 4 0 TIE 10X30 (SUTURE) ×3 IMPLANT
SUT STEEL SZ 6 DBL 3X14 BALL (SUTURE) ×9 IMPLANT
SUT VIC AB 1 CTX 36 (SUTURE) ×8
SUT VIC AB 1 CTX36XBRD ANBCTR (SUTURE) ×4 IMPLANT
SYSTEM SAHARA CHEST DRAIN RE-I (WOUND CARE) ×6 IMPLANT
TAPE CLOTH SURG 4X10 WHT LF (GAUZE/BANDAGES/DRESSINGS) ×3 IMPLANT
TAPE PAPER 2X10 WHT MICROPORE (GAUZE/BANDAGES/DRESSINGS) ×3 IMPLANT
TOWEL GREEN STERILE (TOWEL DISPOSABLE) ×3 IMPLANT
TUBE CONNECTING 12'X1/4 (SUCTIONS) ×1
TUBE CONNECTING 12X1/4 (SUCTIONS) ×2 IMPLANT
WATER STERILE IRR 1000ML POUR (IV SOLUTION) ×6 IMPLANT
YANKAUER SUCT BULB TIP NO VENT (SUCTIONS) ×3 IMPLANT

## 2017-04-17 NOTE — Progress Notes (Signed)
  Echocardiogram Echocardiogram Transesophageal has been performed.  Haitham Dolinsky T Divinity Kyler 04/09/2017, 8:56 AM

## 2017-04-17 NOTE — Transfer of Care (Signed)
Immediate Anesthesia Transfer of Care Note  Patient: James [name]  Procedure(s) Performed: Procedure(s) with comments: INSERTION OF IMPLANTABLE LEFT VENTRICULAR ASSIST DEVICE (N/A) - CIRC ARREST  NITRIC OXIDE TRANSESOPHAGEAL ECHOCARDIOGRAM (TEE) (N/A)  Patient Location: SICU  Anesthesia Type:General  Level of Consciousness: sedated and Patient remains intubated per anesthesia plan  Airway & Oxygen Therapy: Patient remains intubated per anesthesia plan and Patient placed on Ventilator (see vital sign flow sheet for setting)  Post-op Assessment: Report given to RN and Post -op Vital signs reviewed and stable  Post vital signs: Reviewed and stable  Last Vitals:  Vitals:   03/21/17 0527 03/21/17 0528  BP:  109/73  Pulse: 71 70  Resp: 19 19  Temp:  37.2 C    Last Pain:  Vitals:   03/21/17 0528  TempSrc: Oral  PainSc:       Patients Stated Pain Goal: 0 (04/16/17 2000)  Complications: No apparent anesthesia complications

## 2017-04-17 NOTE — Progress Notes (Addendum)
Pharmacy Antibiotic Note  James [name] is a 56 y.o. male admitted on 2017-04-14 HF s/p LVAD placement today to receive surgical prophylaxis up to 48 hours post-AET.  Pharmacy has been consulted for Vancomycin dosing.   SCr rising at 1.90, CrCl 48.8 mL/min WBC 16.1 (post op day 0) Hypothermic post-operatively AET = 1527.  Patient received 1250mg  IV of Vancomycin at 0720 AM, followed by another 1g IV at 0946 AM.   Plan: Vancomycin 750 mg IV every 12 hours x48 hours.  Monitor for signs or symptoms of infection.  Monitor renal function and adjust dosing as needed.   Height: 6' (182.9 cm) Weight: 185 lb 9.6 oz (84.2 kg) IBW/kg (Calculated) : 77.6  Temp (24hrs), Avg:97.4 F (36.3 C), Min:96.8 F (36 C), Max:98.9 F (37.2 C)   Recent Labs Lab 04/13/17 0606 04/14/17 0611 04/16/2017 0602 04/16/17 0517 04/14/2017 0451  WBC 3.7* 5.7 6.3 4.2 4.0  CREATININE 1.53* 1.56* 1.62* 1.84* 1.90*    Estimated Creatinine Clearance: 48.8 mL/min (A) (by C-G formula based on SCr of 1.9 mg/dL (H)).    No Active Allergies  Antimicrobials this admission: Vancomycin 6/28 >>   Dose adjustments this admission:   Microbiology results:  6/22 MRSA PCR: postive  Thank you for allowing pharmacy to be a part of this patient's care.  Link SnufferJessica Tanza Pellot, PharmD, BCPS Clinical Pharmacist Clinical Phone 03/31/2017 until 11PM [phone] After hours, please call #[phone] 04/12/2017 4:48 PM

## 2017-04-17 NOTE — Progress Notes (Signed)
Responded to page for pt who'd been in 2H post-op and had to go back to OR. Family in 2H waiting rm. Provided spiritual/emotional support and prayer -- which family appreciated. [facility] Advised they may ask nurse to page for a chaplain at any time.   03/29/2017 1900  Clinical Encounter Type  Visited With Family  Visit Type Initial;Psychological support;Spiritual support;Social support;Patient in surgery  Referral From Other (Comment) (Administration)  Spiritual Encounters  Spiritual Needs Prayer;Emotional  Stress Factors  Patient Stress Factors Health changes;Loss of control  Family Stress Factors Family relationships;Health changes;Loss of control   James Hamburgerynthia A Avilene [name], [address]

## 2017-04-17 NOTE — Transfer of Care (Signed)
Immediate Anesthesia Transfer of Care Note  Patient: James [name]  Procedure(s) Performed: Procedure(s): MEDIASTINAL EXPLORATION (N/A)  Patient Location: SICU  Anesthesia Type:General  Level of Consciousness: sedated, unresponsive and Patient remains intubated per anesthesia plan  Airway & Oxygen Therapy: Patient remains intubated per anesthesia plan and Patient placed on Ventilator (see vital sign flow sheet for setting)  Post-op Assessment: Report given to RN and Post -op Vital signs reviewed and stable  Post vital signs: Reviewed and stable  Last Vitals:  Vitals:   11-08-2016 1830 11-08-2016 1845  BP:    Pulse:    Resp: 12 12  Temp: 36.1 C (!) 36 C    Last Pain:  Vitals:   11-08-2016 1825  TempSrc: Core (Comment)  PainSc:       Patients Stated Pain Goal: 0 (04/16/17 2000)  Complications: No apparent anesthesia complications

## 2017-04-17 NOTE — Progress Notes (Signed)
Patient transported on vent from OR-15 to 2H-11 without complication.

## 2017-04-17 NOTE — Clinical Social Work Note (Signed)
CSW contacted patient's wife to check on her during surgery. She states that she is doing fine right now. Patient's wife asked about who she would need to give FMLA paperwork to. CSW stated that it would likely be the attending MD once the patient is admitted to a unit following surgery. CSW encouraged patient's wife to contact CSW as needed.  Charlynn CourtSarah Brendalee Matthies, CSW [phone]

## 2017-04-17 NOTE — Brief Op Note (Signed)
03/24/2017 - 03/22/2017  9:30 PM  PATIENT:  James [name]  56 y.o. male  PRE-OPERATIVE DIAGNOSIS:  BLEEDING  POST-OPERATIVE DIAGNOSIS:  BLEEDING  PROCEDURE:  Procedure(s): MEDIASTINAL EXPLORATION (N/A)  SURGEON:  Surgeon(s) and Role:    * Bartle, Payton DoughtyBryan K, MD - Primary    * Donata ClayVan Trigt, Theron AristaPeter, MD - Assisting     ANESTHESIA:   general  EBL:  Total I/O In: 2612 [I.V.:1000; Blood:1612] Out: -   COUNTS:  YES  TOURNIQUET:  * No tourniquets in log *  DICTATION: .Note written in EPIC  PLAN OF CARE: Admit to inpatient   PATIENT DISPOSITION:  ICU - intubated and critically ill.   Delay start of Pharmacological VTE agent (>24hrs) due to surgical blood loss or risk of bleeding: yes

## 2017-04-17 NOTE — Anesthesia Procedure Notes (Addendum)
Arterial Line Insertion Start/End06/03/2017 7:35 AM, 04/17/2017 7:40 AM Performed by: Shona SimpsonHOLLIS, KEVIN D, anesthesiologist  Patient location: OR. Preanesthetic checklist: patient identified, IV checked, site marked, risks and benefits discussed, surgical consent, monitors and equipment checked, pre-op evaluation, timeout performed and anesthesia consent Lidocaine 1% used for infiltration Right, radial was placed Catheter size: 20 Fr Hand hygiene performed  and maximum sterile barriers used   Attempts: 1 Procedure performed without using ultrasound guided technique. Following insertion, dressing applied. Post procedure assessment: normal and unchanged  Patient tolerated the procedure well with no immediate complications.

## 2017-04-17 NOTE — Anesthesia Procedure Notes (Signed)
Central Venous Catheter Insertion Performed by: Shelton SilvasHOLLIS, Krysia Zahradnik D, anesthesiologist Start/End06/25/2018 7:20 AM, 04/17/2017 7:25 AM Patient location: Pre-op. Preanesthetic checklist: patient identified, IV checked, site marked, risks and benefits discussed, surgical consent, monitors and equipment checked, pre-op evaluation, timeout performed and anesthesia consent Hand hygiene performed  and maximum sterile barriers used  PA cath was placed.Swan type:thermodilution Procedure performed using ultrasound guided technique. Ultrasound Notes:anatomy identified, needle tip was noted to be adjacent to the nerve/plexus identified, no ultrasound evidence of intravascular and/or intraneural injection and image(s) printed for medical record Attempts: 1 Patient tolerated the procedure well with no immediate complications.

## 2017-04-17 NOTE — Anesthesia Procedure Notes (Addendum)
Central Venous Catheter Insertion Performed by: Shelton SilvasHOLLIS, Cloyce Blankenhorn D, anesthesiologist Start/End06/08/2017 7:10 AM, 04/17/2017 7:20 AM Patient location: Pre-op. Preanesthetic checklist: patient identified, IV checked, site marked, risks and benefits discussed, surgical consent, monitors and equipment checked, pre-op evaluation, timeout performed and anesthesia consent Position: Trendelenburg Lidocaine 1% used for infiltration and patient sedated Hand hygiene performed , maximum sterile barriers used  and Seldinger technique used Catheter size: 9 Fr Total catheter length 10. Central line was placed.Sheath introducer Swan type:thermodilution PA Cath depth:50 Procedure performed using ultrasound guided technique. Ultrasound Notes:anatomy identified, needle tip was noted to be adjacent to the nerve/plexus identified, no ultrasound evidence of intravascular and/or intraneural injection and image(s) printed for medical record Attempts: 1 Following insertion, line sutured and dressing applied. Post procedure assessment: blood return through all ports, free fluid flow and no air  Patient tolerated the procedure well with no immediate complications.

## 2017-04-17 NOTE — Anesthesia Preprocedure Evaluation (Addendum)
Anesthesia Evaluation  Patient identified by MRN, date of birth, ID band Patient awake    Reviewed: Allergy & Precautions, NPO status , Patient's Chart, lab work & pertinent test results, reviewed documented beta blocker date and time   Airway Mallampati: II  TM Distance: >3 FB Neck ROM: Full    Dental  (+) Teeth Intact, Dental Advisory Given   Pulmonary    breath sounds clear to auscultation       Cardiovascular hypertension, Pt. on home beta blockers and Pt. on medications + CAD, + Past MI and +CHF  + Cardiac Defibrillator  Rhythm:Regular Rate:Normal     Neuro/Psych PSYCHIATRIC DISORDERS Depression    GI/Hepatic negative GI ROS, Neg liver ROS,   Endo/Other  diabetes, Type 2, Oral Hypoglycemic Agents  Renal/GU Renal InsufficiencyRenal disease  negative genitourinary   Musculoskeletal  (+) Arthritis ,   Abdominal Normal abdominal exam  (+)   Peds negative pediatric ROS (+)  Hematology negative hematology ROS (+)   Anesthesia Other Findings   Reproductive/Obstetrics negative OB ROS                            Lab Results  Component Value Date   WBC 4.0 04/07/2017   HGB 7.6 (L) 04/18/2017   HCT 24.2 (L) 03/25/2017   MCV 87.7 03/28/2017   PLT 308 03/26/2017   Lab Results  Component Value Date   CREATININE 1.90 (H) 03/29/2017   BUN 39 (H) 04/06/2017   NA 135 03/29/2017   K 4.6 04/02/2017   CL 95 (L) 03/29/2017   CO2 30 03/24/2017   Lab Results  Component Value Date   INR 1.28 03/23/2017   INR 1.25 11/27/2016   INR 1.27 07/15/2016   .EKG: normal sinus rhythm, RBBB.  Echo: Left ventricle: EF 15-20% diffuse hypokinesis inferior wall   akinesis Left ventricular diastolic function parameters were   normal. - Aortic valve: There was trivial regurgitation. Valve area (VTI):   2.7 cm^2. Valve area (Vmax): 2.68 cm^2. Valve area (Vmean): 2.46   cm^2. - Mitral valve: Calcified  annulus. Mildly thickened leaflets .   There was mild regurgitation. - Left atrium: The atrium was mildly dilated. - Right ventricle: The cavity size was moderately dilated. - Right atrium: The atrium was mildly dilated. - Atrial septum: No defect or patent foramen ovale was identified. - Pulmonary arteries: PA peak pressure: 52 mm Hg (S).   Anesthesia Physical Anesthesia Plan  ASA: IV  Anesthesia Plan: General   Post-op Pain Management:    Induction: Intravenous  PONV Risk Score and Plan: 2 and Ondansetron and Dexamethasone  Airway Management Planned: Oral ETT  Additional Equipment: Arterial line, CVP, PA Cath, TEE and Ultrasound Guidance Line Placement  Intra-op Plan:   Post-operative Plan: Post-operative intubation/ventilation  Informed Consent: I have reviewed the patients History and Physical, chart, labs and discussed the procedure including the risks, benefits and alternatives for the proposed anesthesia with the patient or authorized representative who has indicated his/her understanding and acceptance.   Dental advisory given  Plan Discussed with: CRNA  Anesthesia Plan Comments:         Anesthesia Quick Evaluation

## 2017-04-17 NOTE — Anesthesia Preprocedure Evaluation (Signed)
Anesthesia Evaluation  Patient identified by MRN, date of birth, ID band Patient awake    Reviewed: Allergy & Precautions, NPO status , Patient's Chart, lab work & pertinent test results  Airway Mallampati: Intubated       Dental   Pulmonary    Pulmonary exam normal        Cardiovascular hypertension, Pt. on medications + CAD and + Past MI  Normal cardiovascular exam+ Cardiac Defibrillator   Blleding after LVAD placement today   Neuro/Psych Depression    GI/Hepatic   Endo/Other  diabetes  Renal/GU      Musculoskeletal   Abdominal   Peds  Hematology   Anesthesia Other Findings   Reproductive/Obstetrics                             Anesthesia Physical Anesthesia Plan  ASA: IV  Anesthesia Plan: General   Post-op Pain Management:    Induction: Intravenous  PONV Risk Score and Plan: 2 and Ondansetron and Treatment may vary due to age or medical condition  Airway Management Planned: Oral ETT  Additional Equipment:   Intra-op Plan:   Post-operative Plan: Post-operative intubation/ventilation  Informed Consent:   Plan Discussed with: CRNA and Surgeon  Anesthesia Plan Comments:         Anesthesia Quick Evaluation

## 2017-04-17 NOTE — Op Note (Signed)
CARDIOVASCULAR SURGERY OPERATIVE NOTE  James [name] [id number] 03/25/2017   Surgeon:  Alleen Borne, MD  First Assistant: Kathlee Nations Trigt  Preoperative Diagnosis: Class IV heart failure with LVEF< 20%   Postoperative Diagnosis:  Same   Procedure:  1. Insertion of right femoral arterial line 2. Redo Median Sternotomy 3. Extracorporeal circulation 4.   Implantation of Heartware HVAD Left Ventricular Assist Device.   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 He has been followed by the advanced heart failure team with ischemic cardiomyopathy with an EF of 15% by echo 6/18 now with acute on chronic systolic heart failure. 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 He was felt to be an acceptable candidate for LVAD as destination therapy. He was recently admitted with worsening heart failure and RHC on 6/22 showed elevated filling pressures with evidence of right heart failure. He was started on milrinone and diuresed aggressively and repeat RHC yesterday shows marked improvement in filling pressure and improved cardiac output with a Co-ox of 66% and reduction of CVP to 7-8. Renal function is fairly stable with creat 1.8 this am. I think he is in as good of shape as possible for LVAD surgery at this point and we plan to proceed tomorrow. I discussed the operative procedure with the patient and his wife including alternatives, benefits and risks; including but not limited to bleeding, blood transfusion, infection, stroke, myocardial infarction, RV failure requiring RVAD, organ dysfunction, and death. I also discussed the longer term risks of GI bleeding, pump thrombosis,  drive line infection and pump malfunction.  James [name] understands and agrees to proceed.     Preparation:  The patient was seen in the preoperative holding area and the correct patient, correct operation were confirmed with the patient after reviewing the medical record and catheterization. The consent was signed by me. Preoperative antibiotics were given. A pulmonary arterial line and radial arterial line were placed by the anesthesia team. The patient was taken back to the operating room and positioned supine on the operating room table. After being placed under general endotracheal anesthesia by the anesthesia team a foley catheter was placed. The neck, chest, abdomen, and both legs were prepped with betadine soap and solution and draped in the usual sterile manner. A surgical time-out was taken and the correct patient and operative procedure were confirmed with the nursing and anesthesia staff.   Pre-bypass and Post-bypass TEE:   Complete TEE assessment was performed by Dr. Shona Simpson    Left ventricle: Cavity is dilated. LV systolic function is <15%. Diffuse hypokinesis. Inferoseptal and Inferior wall motion is akinetic.  Septum: No Patent Foramen Ovale present. Negative bubble study.  Left atrium: Cavity is mildly dilated. Dilated left atrial appendage.  Aortic valve: The valve is trileaflet. No stenosis. No regurgitation.  Mitral valve: Mild leaflet thickening is present. Predominantly anterior mild mitral annular calcification. Mild regurgitation.  Right ventricle: Normal wall thickness. Cavity is dilated. Severely reduced systolic function.    Insertion of right femoral arterial line:  A right femoral arterial line was placed using Seldinger technique without difficulty. There was a normal BP tracing.   Redo Median  Sternotomy:   A redo median sternotomy was performed without difficulty using the oscillating saw. The sternum was retracted using bone hooks and the RV  dissected from the sternum. Right ventricular function appeared moderately depressed with mild distension. The old RCA vein graft was identified and appeared chronically occluded. It was ligated and divided proximally to aid exposure of the aorta. The ascending aorta was of normal size and had no palpable plaque. There were no contraindications to aortic cannulation or cross-clamping. The left side of the sternum with retracted with the Ruhltract. The remainder of the RV and LV were dissected from the sternum and chest wall. The left diaphragm was partially taken down from the anterior chest wall using electrocautery.  A pre-peritoneal plane was developed inferiorly to aid with exposure for the pump and outflow graft. All blood vessels of any size were clipped and divided. Then a 4 cm transverse incision was made to the left of the umbilicus and continued down to the rectus fascia using electrocautery. An #11 blade was used to make the drive line exit site about 3 cm below the left costal margin in the anterior axillary line. The t tunneling device was passed in a subcutaneous plane from the transverse abdominal incision to a point midway to the pericardium and then through the rectus fascia into the pre-peritoneal space.    Cardiopulmonary Bypass:  The patient was fully systemically heparinized and the ACT was maintained > 400 sec. The proximal aortic arch was cannulated with a 20 F aortic cannula for arterial inflow. Venous cannulation was performed via the right atrial appendage using a two-staged venous cannula. Carbon dioxide was insufflated into the pericardium at 6L/min throughout the procedure to minimize intracardiac air. The systemic temperature was allowed to drift downward slightly during the procedure and the patient was actively rewarmed.   Aortic outflow graft anastomosis:  The outflow graft was cut to the appropriate length. It was placed inside the bend relief. The ascending aorta was  partially occluded with a Lemole clamp. A vertical midline aortotomy was created and the ends enlarged with a 4.5 mm punch. The outflow graft was anastomosed to the aorta using 4-0 prolene pledgetted sutures at the toe and heal that were run down each side. Coseal was used to seal the needle holes in the graft. The Lemole clamp was removed and the graft flushed and clamped with a peripheral Debakey clamp.   Insertion of apical outflow cannula:  The patient was placed on cardiopulmonary bypass. The remainder of the heart was dissected carefully from the pericardium. The LIMA pedicle was identified and preserved. Laparotomy pads were placed behind the heart to aid exposure of the apex. A site was chosen on the anterior wall lateral to the LAD and superior to the true apex at the dimple area.  The sewing ring was centered on this spot.Then a series of 2-0 Ethibond horizontal mattress sutures were placed partial thickness through the myocardium and then the sewing cuff.  The sutures were tied and the ring appeared to be well-sealed to the apex.  A stab incision was made and the coring device passed into the left ventricle. The coring device was released and then rotated to remove the apical core. Trabeculations that might cause obstruction were excised. There was no clot. Then volume was left in the heart and the lungs inflated. The HVAD pump was brought onto the operative field. The outflow graft was attached in the recommended fashion.  The inflow cannula was inserted into  the apical sewing ring and the screw tightened until it clicked.  The outflow graft was clamped.  The drive line was then tunneled using the previously placed tunneling device. The heart and pump were returned to the pericardium.     Outflow graft anastomosis:  The graft was passed along the pericardial edge and lateral to the right atrium and cut to the appropriate length. The aorta was clamped with a LeMole clamp and an oblique aortotomy  made. It was broadened at each end using a 4.5 mm punch. The graft was anastomosed to the aorta in end to side manner using continuous 4.0 prolene suture. The graft was deaired before completing the anastomosis and a 19 gauge needle was placed in the graft proximal to the clamp. The partial occlusion clamp was removed from the aorta and the anastomosis was hemostatic.    Weaning from bypass:  The patient was started on Milrinone 0.3 mcg/kg/min, epinephrine 3 mcg/kg/min, and nitric oxide at 30 ppm. The HVAD was started at 1700 rpm. TEE confirmed that the intracardiac air was removed.  Cardiopulmonary bypass flow was decreased to half flow and the clamp removed from the outflow graft. The speed was increased to 1800 rpm. Flow was decreased to 1/4 and the speed increased sequentially by 100 to 2400. Cardiopulmonary bypass was discontinued and the speed increased sequentially to 2700. Pulmonary artery pressures were normal at 40/16 with a CVP of 6. CO was 4 L/min. CPB time was 142 minutes.    Post-bypass TEE:  Post Bypass:  Heartware well seated and functioning properly. Inflow cannula directed slightly towards septum with minimum bowing of the ventricular septum noted after adequate volume resuscitation. Mild MR unchanged. Aortic valve opening & closing appropriately with no significant insufficiency after LVAD placement. No dissection of the aorta noted. RV function improved (Note: Epi, Milrinone, NO).  Completion:  Two temporary epicardial pacing wires were placed on the right ventricle. Heparin was fully reversed with protamine and the aortic and venous cannulas removed. Hemostasis was achieved. Mediastinal drainage tubes and bilateral pleural chest tubes were placed with a drain in the pocket.  The sternum was closed with #6 stainless steel wires. The fascia was closed with continuous # 1 vicryl suture with interrupted # 1 vicryl sutures in the lower incisional fascia. The subcutaneous tissue was  closed with 2-0 vicryl continuous suture. The skin was closed with 3-0 vicryl subcuticular suture. The abdominal incision was closed with continuous 3-0 vicryl subcutaneous suture and 3-0 vicryl subcuticular skin suture. The drive line exit site was re-approximated with 3-0 vicryl subcutaneous sutures and 3-0 nylon skin sutures. The drive line fastener was applied. All sponge, needle, and instrument counts were reported correct at the end of the case. Dry sterile dressings were placed over the incisions and around the chest tubes which were connected to pleurevac suction. The patient was then transported to the surgical intensive care unit in critical but stable condition.

## 2017-04-17 NOTE — Progress Notes (Signed)
Patient transported to OR via bed. Anesthesia, RT, 2 VAD nurses, and OR staff transporting. No changes. Marcellus ScottLesley Wilson, VAD Coordinator, to OR at 1910. Dr. Laneta SimmersBartle present as well.

## 2017-04-18 ENCOUNTER — Encounter (HOSPITAL_COMMUNITY): Payer: Self-pay | Admitting: Surgery

## 2017-04-18 ENCOUNTER — Inpatient Hospital Stay (HOSPITAL_COMMUNITY): Payer: Medicare PPO

## 2017-04-18 DIAGNOSIS — Z95811 Presence of heart assist device: Secondary | ICD-10-CM

## 2017-04-18 DIAGNOSIS — I5023 Acute on chronic systolic (congestive) heart failure: Secondary | ICD-10-CM

## 2017-04-18 LAB — PREPARE FRESH FROZEN PLASMA
UNIT DIVISION: 0
UNIT DIVISION: 0
UNIT DIVISION: 0
UNIT DIVISION: 0
Unit division: 0
Unit division: 0
Unit division: 0
Unit division: 0

## 2017-04-18 LAB — POCT I-STAT, CHEM 8
BUN: 37 mg/dL — ABNORMAL HIGH (ref 6–20)
BUN: 34 mg/dL — AB (ref 6–20)
BUN: 35 mg/dL — ABNORMAL HIGH (ref 6–20)
BUN: 38 mg/dL — AB (ref 6–20)
BUN: 37 mg/dL — AB (ref 6–20)
CALCIUM ION: 1.13 mmol/L — AB (ref 1.15–1.40)
CHLORIDE: 93 mmol/L — AB (ref 101–111)
CHLORIDE: 94 mmol/L — AB (ref 101–111)
CHLORIDE: 95 mmol/L — AB (ref 101–111)
CREATININE: 1.7 mg/dL — AB (ref 0.61–1.24)
CREATININE: 1.5 mg/dL — AB (ref 0.61–1.24)
Calcium, Ion: 1.12 mmol/L — ABNORMAL LOW (ref 1.15–1.40)
Calcium, Ion: 1.08 mmol/L — ABNORMAL LOW (ref 1.15–1.40)
Calcium, Ion: 1.09 mmol/L — ABNORMAL LOW (ref 1.15–1.40)
Calcium, Ion: 1.09 mmol/L — ABNORMAL LOW (ref 1.15–1.40)
Chloride: 91 mmol/L — ABNORMAL LOW (ref 101–111)
Chloride: 94 mmol/L — ABNORMAL LOW (ref 101–111)
Creatinine, Ser: 1.6 mg/dL — ABNORMAL HIGH (ref 0.61–1.24)
Creatinine, Ser: 1.6 mg/dL — ABNORMAL HIGH (ref 0.61–1.24)
Creatinine, Ser: 1.5 mg/dL — ABNORMAL HIGH (ref 0.61–1.24)
GLUCOSE: 82 mg/dL (ref 65–99)
Glucose, Bld: 105 mg/dL — ABNORMAL HIGH (ref 65–99)
Glucose, Bld: 141 mg/dL — ABNORMAL HIGH (ref 65–99)
Glucose, Bld: 143 mg/dL — ABNORMAL HIGH (ref 65–99)
Glucose, Bld: 150 mg/dL — ABNORMAL HIGH (ref 65–99)
HCT: 26 % — ABNORMAL LOW (ref 39.0–52.0)
HCT: 29 % — ABNORMAL LOW (ref 39.0–52.0)
HEMATOCRIT: 25 % — AB (ref 39.0–52.0)
HEMATOCRIT: 23 % — AB (ref 39.0–52.0)
HEMATOCRIT: 23 % — AB (ref 39.0–52.0)
HEMOGLOBIN: 7.8 g/dL — AB (ref 13.0–17.0)
Hemoglobin: 8.5 g/dL — ABNORMAL LOW (ref 13.0–17.0)
Hemoglobin: 7.8 g/dL — ABNORMAL LOW (ref 13.0–17.0)
Hemoglobin: 8.8 g/dL — ABNORMAL LOW (ref 13.0–17.0)
Hemoglobin: 9.9 g/dL — ABNORMAL LOW (ref 13.0–17.0)
POTASSIUM: 4.6 mmol/L (ref 3.5–5.1)
POTASSIUM: 4.5 mmol/L (ref 3.5–5.1)
POTASSIUM: 4.5 mmol/L (ref 3.5–5.1)
POTASSIUM: 5.1 mmol/L (ref 3.5–5.1)
POTASSIUM: 4.1 mmol/L (ref 3.5–5.1)
SODIUM: 135 mmol/L (ref 135–145)
SODIUM: 135 mmol/L (ref 135–145)
Sodium: 135 mmol/L (ref 135–145)
Sodium: 135 mmol/L (ref 135–145)
Sodium: 136 mmol/L (ref 135–145)
TCO2: 33 mmol/L (ref 0–100)
TCO2: 33 mmol/L (ref 0–100)
TCO2: 29 mmol/L (ref 0–100)
TCO2: 31 mmol/L (ref 0–100)
TCO2: 33 mmol/L (ref 0–100)

## 2017-04-18 LAB — BPAM FFP
BLOOD PRODUCT EXPIRATION DATE: 201807022359
BLOOD PRODUCT EXPIRATION DATE: 201807022359
BLOOD PRODUCT EXPIRATION DATE: 201807022359
BLOOD PRODUCT EXPIRATION DATE: 201807032359
BLOOD PRODUCT EXPIRATION DATE: 201807032359
BLOOD PRODUCT EXPIRATION DATE: 201807032359
Blood Product Expiration Date: 201807032359
Blood Product Expiration Date: 201807032359
ISSUE DATE / TIME: 201806281037
ISSUE DATE / TIME: 201806281037
ISSUE DATE / TIME: 201806281037
ISSUE DATE / TIME: 201806281128
ISSUE DATE / TIME: 201806281808
ISSUE DATE / TIME: 201806281808
ISSUE DATE / TIME: 201806281907
ISSUE DATE / TIME: 201806281907
UNIT TYPE AND RH: 6200
UNIT TYPE AND RH: 6200
UNIT TYPE AND RH: 6200
UNIT TYPE AND RH: 6200
Unit Type and Rh: 6200
Unit Type and Rh: 6200
Unit Type and Rh: 6200
Unit Type and Rh: 6200

## 2017-04-18 LAB — COMPREHENSIVE METABOLIC PANEL
ALBUMIN: 2.9 g/dL — AB (ref 3.5–5.0)
ALT: 14 U/L — AB (ref 17–63)
AST: 38 U/L (ref 15–41)
Alkaline Phosphatase: 66 U/L (ref 38–126)
Anion gap: 7 (ref 5–15)
BILIRUBIN TOTAL: 4.5 mg/dL — AB (ref 0.3–1.2)
BUN: 32 mg/dL — AB (ref 6–20)
CHLORIDE: 103 mmol/L (ref 101–111)
CO2: 25 mmol/L (ref 22–32)
CREATININE: 1.65 mg/dL — AB (ref 0.61–1.24)
Calcium: 8.3 mg/dL — ABNORMAL LOW (ref 8.9–10.3)
GFR calc Af Amer: 53 mL/min — ABNORMAL LOW (ref >60)
GFR calc non Af Amer: 46 mL/min — ABNORMAL LOW (ref >60)
GLUCOSE: 139 mg/dL — AB (ref 65–99)
POTASSIUM: 4.4 mmol/L (ref 3.5–5.1)
Sodium: 135 mmol/L (ref 135–145)
TOTAL PROTEIN: 4.7 g/dL — AB (ref 6.5–8.1)

## 2017-04-18 LAB — POCT I-STAT 3, ART BLOOD GAS (G3+)
ACID-BASE EXCESS: 9 mmol/L — AB (ref 0.0–2.0)
ACID-BASE EXCESS: 8 mmol/L — AB (ref 0.0–2.0)
ACID-BASE EXCESS: 6 mmol/L — AB (ref 0.0–2.0)
Acid-Base Excess: 5 mmol/L — ABNORMAL HIGH (ref 0.0–2.0)
BICARBONATE: 33 mmol/L — AB (ref 20.0–28.0)
BICARBONATE: 33.3 mmol/L — AB (ref 20.0–28.0)
BICARBONATE: 28 mmol/L (ref 20.0–28.0)
BICARBONATE: 31 mmol/L — AB (ref 20.0–28.0)
O2 SAT: 100 %
O2 Saturation: 100 %
O2 Saturation: 100 %
O2 Saturation: 100 %
PH ART: 7.515 — AB (ref 7.350–7.450)
PH ART: 7.507 — AB (ref 7.350–7.450)
PO2 ART: 280 mmHg — AB (ref 83.0–108.0)
PO2 ART: 174 mmHg — AB (ref 83.0–108.0)
PO2 ART: 169 mmHg — AB (ref 83.0–108.0)
Patient temperature: 37.1
Patient temperature: 36.7
TCO2: 34 mmol/L (ref 0–100)
TCO2: 35 mmol/L (ref 0–100)
TCO2: 29 mmol/L (ref 0–100)
TCO2: 32 mmol/L (ref 0–100)
pCO2 arterial: 40.9 mmHg (ref 32.0–48.0)
pCO2 arterial: 48.1 mmHg — ABNORMAL HIGH (ref 32.0–48.0)
pCO2 arterial: 35.4 mmHg (ref 32.0–48.0)
pCO2 arterial: 44.9 mmHg (ref 32.0–48.0)
pH, Arterial: 7.447 (ref 7.350–7.450)
pH, Arterial: 7.446 (ref 7.350–7.450)
pO2, Arterial: 377 mmHg — ABNORMAL HIGH (ref 83.0–108.0)

## 2017-04-18 LAB — GLUCOSE, CAPILLARY
GLUCOSE-CAPILLARY: 103 mg/dL — AB (ref 65–99)
GLUCOSE-CAPILLARY: 128 mg/dL — AB (ref 65–99)
GLUCOSE-CAPILLARY: 145 mg/dL — AB (ref 65–99)
Glucose-Capillary: 131 mg/dL — ABNORMAL HIGH (ref 65–99)
Glucose-Capillary: 134 mg/dL — ABNORMAL HIGH (ref 65–99)
Glucose-Capillary: 148 mg/dL — ABNORMAL HIGH (ref 65–99)
Glucose-Capillary: 138 mg/dL — ABNORMAL HIGH (ref 65–99)
Glucose-Capillary: 124 mg/dL — ABNORMAL HIGH (ref 65–99)
Glucose-Capillary: 70 mg/dL (ref 65–99)
Glucose-Capillary: 76 mg/dL (ref 65–99)
Glucose-Capillary: 108 mg/dL — ABNORMAL HIGH (ref 65–99)
Glucose-Capillary: 121 mg/dL — ABNORMAL HIGH (ref 65–99)
Glucose-Capillary: 176 mg/dL — ABNORMAL HIGH (ref 65–99)
Glucose-Capillary: 149 mg/dL — ABNORMAL HIGH (ref 65–99)
Glucose-Capillary: 145 mg/dL — ABNORMAL HIGH (ref 65–99)
Glucose-Capillary: 129 mg/dL — ABNORMAL HIGH (ref 65–99)
Glucose-Capillary: 127 mg/dL — ABNORMAL HIGH (ref 65–99)

## 2017-04-18 LAB — CBC
HCT: 26.1 % — ABNORMAL LOW (ref 39.0–52.0)
HEMATOCRIT: 26.2 % — AB (ref 39.0–52.0)
HEMOGLOBIN: 8.9 g/dL — AB (ref 13.0–17.0)
HEMOGLOBIN: 8.6 g/dL — AB (ref 13.0–17.0)
MCH: 27.7 pg (ref 26.0–34.0)
MCH: 27.9 pg (ref 26.0–34.0)
MCHC: 34 g/dL (ref 30.0–36.0)
MCHC: 33 g/dL (ref 30.0–36.0)
MCV: 81.6 fL (ref 78.0–100.0)
MCV: 84.7 fL (ref 78.0–100.0)
PLATELETS: 140 10*3/uL — AB (ref 150–400)
PLATELETS: 148 10*3/uL — AB (ref 150–400)
RBC: 3.21 MIL/uL — AB (ref 4.22–5.81)
RBC: 3.08 MIL/uL — AB (ref 4.22–5.81)
RDW: 17.6 % — ABNORMAL HIGH (ref 11.5–15.5)
RDW: 18.7 % — ABNORMAL HIGH (ref 11.5–15.5)
WBC: 15.3 10*3/uL — AB (ref 4.0–10.5)
WBC: 17.5 10*3/uL — AB (ref 4.0–10.5)

## 2017-04-18 LAB — CREATININE, SERUM
CREATININE: 1.87 mg/dL — AB (ref 0.61–1.24)
GFR calc non Af Amer: 39 mL/min — ABNORMAL LOW (ref >60)
GFR, EST AFRICAN AMERICAN: 45 mL/min — AB (ref >60)

## 2017-04-18 LAB — HEMOGLOBIN A1C
Hgb A1c MFr Bld: 5.9 % — ABNORMAL HIGH (ref 4.8–5.6)
MEAN PLASMA GLUCOSE: 123 mg/dL

## 2017-04-18 LAB — COOXEMETRY PANEL
CARBOXYHEMOGLOBIN: 1.5 % (ref 0.5–1.5)
METHEMOGLOBIN: 2.1 % — AB (ref 0.0–1.5)
O2 SAT: 77.9 %
TOTAL HEMOGLOBIN: 8 g/dL — AB (ref 12.0–16.0)

## 2017-04-18 LAB — CBC WITH DIFFERENTIAL/PLATELET
Basophils Absolute: 0 10*3/uL (ref 0.0–0.1)
Basophils Relative: 0 %
EOS ABS: 0 10*3/uL (ref 0.0–0.7)
EOS PCT: 0 %
HCT: 26.2 % — ABNORMAL LOW (ref 39.0–52.0)
Hemoglobin: 8.8 g/dL — ABNORMAL LOW (ref 13.0–17.0)
LYMPHS ABS: 0.5 10*3/uL — AB (ref 0.7–4.0)
LYMPHS PCT: 3 %
MCH: 27.4 pg (ref 26.0–34.0)
MCHC: 33.6 g/dL (ref 30.0–36.0)
MCV: 81.6 fL (ref 78.0–100.0)
MONO ABS: 0.8 10*3/uL (ref 0.1–1.0)
Monocytes Relative: 5 %
Neutro Abs: 14.3 10*3/uL — ABNORMAL HIGH (ref 1.7–7.7)
Neutrophils Relative %: 92 %
PLATELETS: 138 10*3/uL — AB (ref 150–400)
RBC: 3.21 MIL/uL — ABNORMAL LOW (ref 4.22–5.81)
RDW: 17.5 % — AB (ref 11.5–15.5)
WBC: 15.7 10*3/uL — ABNORMAL HIGH (ref 4.0–10.5)

## 2017-04-18 LAB — MAGNESIUM
Magnesium: 2 mg/dL (ref 1.7–2.4)
Magnesium: 2 mg/dL (ref 1.7–2.4)

## 2017-04-18 LAB — LACTATE DEHYDROGENASE: LDH: 227 U/L — ABNORMAL HIGH (ref 98–192)

## 2017-04-18 LAB — PHOSPHORUS: Phosphorus: 3.2 mg/dL (ref 2.5–4.6)

## 2017-04-18 LAB — PROTIME-INR
INR: 1.31
Prothrombin Time: 16.4 seconds — ABNORMAL HIGH (ref 11.4–15.2)

## 2017-04-18 LAB — BRAIN NATRIURETIC PEPTIDE: B Natriuretic Peptide: 947.4 pg/mL — ABNORMAL HIGH (ref 0.0–100.0)

## 2017-04-18 LAB — APTT: aPTT: 39 seconds — ABNORMAL HIGH (ref 24–36)

## 2017-04-18 MED ORDER — INSULIN DETEMIR 100 UNIT/ML ~~LOC~~ SOLN
15.0000 [IU] | Freq: Every day | SUBCUTANEOUS | Status: DC
  Administered 2017-04-18 – 2017-04-23 (×6): 15 [IU] via SUBCUTANEOUS
  Filled 2017-04-18 (×7): qty 0.15

## 2017-04-18 MED ORDER — INSULIN ASPART 100 UNIT/ML ~~LOC~~ SOLN
0.0000 [IU] | SUBCUTANEOUS | Status: DC
  Administered 2017-04-18 – 2017-04-22 (×10): 2 [IU] via SUBCUTANEOUS
  Administered 2017-04-25 – 2017-04-26 (×12): 4 [IU] via SUBCUTANEOUS
  Administered 2017-04-27: 8 [IU] via SUBCUTANEOUS
  Administered 2017-04-27: 2 [IU] via SUBCUTANEOUS
  Administered 2017-04-27: 8 [IU] via SUBCUTANEOUS
  Administered 2017-04-27 (×2): 4 [IU] via SUBCUTANEOUS
  Administered 2017-04-27 – 2017-04-28 (×3): 2 [IU] via SUBCUTANEOUS
  Administered 2017-04-28: 4 [IU] via SUBCUTANEOUS
  Administered 2017-04-28 (×3): 2 [IU] via SUBCUTANEOUS
  Administered 2017-04-28: 8 [IU] via SUBCUTANEOUS
  Administered 2017-04-29: 4 [IU] via SUBCUTANEOUS
  Administered 2017-04-29: 2 [IU] via SUBCUTANEOUS
  Administered 2017-04-29 (×2): 4 [IU] via SUBCUTANEOUS
  Administered 2017-04-29: 2 [IU] via SUBCUTANEOUS
  Administered 2017-04-30 (×2): 4 [IU] via SUBCUTANEOUS
  Administered 2017-04-30: 8 [IU] via SUBCUTANEOUS
  Administered 2017-04-30 – 2017-05-01 (×3): 2 [IU] via SUBCUTANEOUS
  Administered 2017-05-01: 4 [IU] via SUBCUTANEOUS
  Administered 2017-05-01: 2 [IU] via SUBCUTANEOUS

## 2017-04-18 MED FILL — Sodium Chloride IV Soln 0.9%: INTRAVENOUS | Qty: 4000 | Status: AC

## 2017-04-18 MED FILL — Heparin Sodium (Porcine) Inj 1000 Unit/ML: INTRAMUSCULAR | Qty: 30 | Status: AC

## 2017-04-18 MED FILL — Mannitol IV Soln 20%: INTRAVENOUS | Qty: 500 | Status: AC

## 2017-04-18 MED FILL — Sodium Bicarbonate IV Soln 8.4%: INTRAVENOUS | Qty: 50 | Status: AC

## 2017-04-18 MED FILL — Electrolyte-R (PH 7.4) Solution: INTRAVENOUS | Qty: 5000 | Status: AC

## 2017-04-18 NOTE — Care Management Note (Signed)
Case Management Note Previous CM note initiated by [name], Brenda Kaye, RN--04/11/2017, 12:14 PM     Patient Details  Name: James [name] MRN: [id number] Date of Birth: Apr 04, 1961  Subjective/Objective: Pt presented for CHF exacerbation. Pt initiated on IV Lasix and Milrinone gtt. Plan to continue LVAD workup.  Pt is from home with wife.                   Action/Plan: CM will continue to monitor for additional needs.   Expected Discharge Date:                  Expected Discharge Plan:  Home w Home Health Services  In-House Referral:  NA  Discharge planning Services  CM Consult  Post Acute Care Choice:    Choice offered to:     DME Arranged:    DME Agency:     HH Arranged:    HH Agency:     Status of Service:  In process, will continue to follow  If discussed at Long Length of Stay Meetings, dates discussed:    Discharge Disposition:   Additional Comments:  04/18/17- 1040- Donn PieriniKristi Noah Lembke RN, CM- pt s/p [facility]AD implated on 04/08/2017 by Dr. Laneta SimmersBartle. tx to 2H post op- CM to continue to follow for d/c needs   Darrold SpanWebster, James Revard Hall, RN 04/18/2017, 10:40 AM

## 2017-04-18 NOTE — Procedures (Signed)
Extubation Procedure Note  Patient Details:   Name: James [name] DOB: [birth year] MRN: [id number]   Airway Documentation:     Evaluation  O2 sats: stable throughout Complications: No apparent complications Patient did tolerate procedure well. Bilateral Breath Sounds: Clear   Yes   Patient extubated to 2L nasal cannula with 3 ppm of nitric oxide bled in.  Positive cuff leak noted.  No evidence of stridor.  Patient able to speak post extubation.  Sats currently 96%.  NIF of -25, VC of 1.3L with good effort.  Incentive spirometry performed with RN.  Vitals are stable.  No complications noted.    Durwin GlazeBrown, Xinyi Batton N 04/18/2017, 3:44 PM

## 2017-04-18 NOTE — Anesthesia Postprocedure Evaluation (Signed)
Anesthesia Post Note  Patient: James [name]  Procedure(s) Performed: Procedure(s) (LRB): MEDIASTINAL EXPLORATION (N/A)     Patient location during evaluation: SICU Anesthesia Type: General Level of consciousness: sedated Pain management: pain level controlled Vital Signs Assessment: post-procedure vital signs reviewed and stable Respiratory status: patient remains intubated per anesthesia plan Cardiovascular status: stable Anesthetic complications: no    Last Vitals:  Vitals:   04/18/17 0645 04/18/17 0700  BP:    Pulse:  90  Resp: 12 12  Temp: 37.3 C 37.2 C    Last Pain:  Vitals:   04/18/17 0600  TempSrc: Core (Comment)  PainSc:                  [name] DAVID

## 2017-04-18 NOTE — Progress Notes (Signed)
Nutrition Consult/Follow Up  DOCUMENTATION CODES:   Severe malnutrition in context of chronic illness  INTERVENTION:    If TF started, rec Vital AF 1.2 formula at goal rate of 70 ml/h (1680 ml per day)   TF regimen to provide 2016 kcals, 126 gm protein, 1362 ml of free water  NUTRITION DIAGNOSIS:   Malnutrition (severe) related to chronic illness (CHF) as evidenced by severe depletion of body fat, severe depletion of muscle mass, ongoing  GOAL:   Patient will meet greater than or equal to 90% of their needs, currently unmet  MONITOR:   Vent status, Diet advancement, Labs, Weight trends, Skin, I & O's  ASSESSMENT:   56 yo male with hx of HTN, gout, obesity, chronic edema, CAD, ischemic cardiomyopathy, DM2, AICD, MI, CHF, renal insufficiency who was admitted on 6/22 with acute on chronic systolic heart failure.  Patient is currently intubated on ventilator support MV: 9.0 L/min Temp (24hrs), Avg:98.1 F (36.7 C), Min:96.1 F (35.6 C), Max:99.1 F (37.3 C)  OGT in place  Pt s/p HVAD 6/28. Initial nutrition assessment completed 6/26. Prior to surgery PO intake was good at 100% per flowsheets.  Identified with severe malnutrition. Pt enjoyed Boost and Ensure supplements. Medications reviewed and include Reglan. Labs reviewed. CBG's [id number].  Diet Order:  Diet NPO time specified  Skin:  Wound (see comment) (open wound R leg; boil R buttocks)  Last BM:  6/27  Height:   Ht Readings from Last 1 Encounters:  03/27/2017 6' (1.829 m)   Weight:   Wt Readings from Last 1 Encounters:  04/18/17 207 lb 14.3 oz (94.3 kg)   Ideal Body Weight:  80.9 kg  BMI:  Body mass index is 28.2 kg/m.  Estimated Nutritional Needs:   Kcal:  2039  Protein:  120-135 gm  Fluid:  per MD  EDUCATION NEEDS:   No education needs identified at this time  Maureen ChattersKatie Priest Lockridge, RD, LDN Pager #: [phone] After-Hours Pager #: [phone]

## 2017-04-18 NOTE — Op Note (Signed)
04/09/2017 James [name] [id number]  Surgeon: Alleen BorneBryan K. Bartle, MD   First Assistant: Kerin PernaPeter Van Trigt, MD  Preoperative Diagnosis: Mediastinal bleeding s/p LVAD insertion  Postoperative Diagnosis: Same   Procedure:  1.  Median Sternotomy 2. Evacuation of mediastinal hematoma and ligation of bleeding points   Anesthesia: General Endotracheal   Clinical History/Surgical Indication:   The patient underwent insertion of an HVAD earlier in the day. At the conclusion of surgery he was oozing from the raw surfaces in the mediastinum since it was a redo surgery but I thought that this would stop since his coagulation profile had been corrected. In the ICU he continued to put out 300 cc per hour from his chest tubes for the first 3 hrs and the tubes were clotting so I felt that it was best to return to the OR for exploration to be sure that there were no surgical sites of bleeding. I discussed the procedure with the patient's wife and she understood and agreed.  Preparation:  The patient was taken directly to the operating room. The patient was positioned supine on the operating room table. He was still under general endotracheal anesthesia. The neck, chest and abdomen were prepped with betadine soap and solution and draped in the usual sterile manner. A surgical time-out was taken and the correct patient and operative procedure were confirmed with the nursing and anesthesia staff.   Mediastinal exploration:  The previous median sternotomy was opened. Upon separating the sternum there was immediate drainage of some fresh blood and there was a moderate amount of clot. The tubes appeared clotted. The clot was removed.  The cannulation sites and aortic anastomosis was hemostatic. The area of pump insertion at the LV apex apeared hemostatic. There was some bleeding around the drive line as it exited the posterior rectus sheath and this was controlled with cautery and sutures. There were multiple sites of  oozing from the raw surfaces in the mediastinum and this was controlled with cautery and Surgifoam powder. There was good hemostasis at the conclusion.    Completion:   Mediastinal and pleural drainage tubes were declotted and returned to their previous postion.  The sternum was closed with double #6 stainless steel wires. The fascia was closed with continuous # 1 vicryl suture. The subcutaneous tissue was closed with 2-0 vicryl continuous suture. The skin was closed with 3-0 vicryl subcuticular suture. All sponge, needle, and instrument counts were reported correct at the end of the case. Dry sterile dressings were placed over the incisions and around the chest tubes which were connected to pleurevac suction. The patient was then transported to the surgical intensive care unit in critical but stable condition.

## 2017-04-18 NOTE — Progress Notes (Signed)
PT Cancellation Note  Patient Details Name: James [name] MRN: [id number] DOB: 07-08-1961   Cancelled Treatment:    Reason Eval/Treat Not Completed: Patient not medically ready. PT eval received, chart reviewed. Will attempt PT eval POD #2 per ordering MD.    Marylynn PearsonLaura D Neliah Cuyler 04/18/2017, 6:46 AM   Conni SlipperLaura Pier Laux, PT, DPT Acute Rehabilitation Services Pager: [phone]

## 2017-04-18 NOTE — Progress Notes (Signed)
Patient ID: James [name], male   DOB: [birth year], 56 y.o.   MRN: [id number]   Advanced Heart Failure VAD Team Note  Subjective:    Initially admitted and aggressively diuresed.    To OR for HVAD placement 6/28.  Returned to the OR that evening with high chest tube output, evacuation of mediastinal hematoma.   Stable this morning.  He is awake enough on Precedex to follow some commands.  Epinephrine continues at 3, norepinephrine at 8, milrinone 0.3.  He is on NO at 15 ppm currently.  MAP 70s.   Creatinine stable today at 1.65. Total bilirubin elevated 4.5.   Swan numbers: CVP 6 PA 38/20 CI 2.9 Co-ox 78%  HVAD INTERROGATION:  HVAD:  Flow 4.8 liters/min, speed 2700, power 4.2, peak 7/trough 3.5.  No alarms  Objective:    Vital Signs:   Temp:  [96.1 F (35.6 C)-99.1 F (37.3 C)] 99 F (37.2 C) (06/29 0700) Pulse Rate:  [25-92] 90 (06/29 0700) Resp:  [0-21] 12 (06/29 0700) BP: (61-104)/(42-80) 72/57 (06/29 0600) SpO2:  [93 %-100 %] 100 % (06/29 0700) Arterial Line BP: (62-146)/(59-93) 67/61 (06/29 0700) FiO2 (%):  [40 %-100 %] 50 % (06/29 0700) Weight:  [207 lb 14.3 oz (94.3 kg)] 207 lb 14.3 oz (94.3 kg) (06/29 0630) Last BM Date: 04/16/17 Mean arterial Pressure 70s  Intake/Output:   Intake/Output Summary (Last 24 hours) at 04/18/17 0744 Last data filed at 04/18/17 0700  Gross per 24 hour  Intake         11126.01 ml  Output             5645 ml  Net          5481.01 ml     Physical Exam    General:  Intubated/sedated HEENT: Swan present.  Neck: supple. JVP difficult to to support devices.  Cor: Mechanical heart sounds with LVAD hum present. Lungs: decreased breath sounds at bases bilaterally.  Abdomen: soft, nondistended. No hepatosplenomegaly. No bruits or masses. Good bowel sounds. Driveline: C/D/I; securement device intact and driveline incorporated Extremities: no cyanosis, clubbing, rash.  1+ ankle edema.  Neuro: Sedated on vent but awakens, follows commands.     Telemetry   Personally reviewed.  A-paced, v-sensed  Labs   Basic Metabolic Panel:  Recent Labs Lab 04/10/2017 0602 04/16/17 0517 04/09/2017 0451  04/10/2017 1638 04/13/2017 1653 03/22/2017 1943 04/18/2017 2033 04/12/2017 2204 04/02/2017 2220 04/18/17 0416  NA 135 133* 135  < > 137 137 136 137 137  --  135  K 4.3 5.0 4.6  < > 4.1 4.1 4.9 4.7 4.8  --  4.4  CL 96* 95* 95*  --  102 97* 98* 97*  --   --  103  CO2 29 29 30   --  28  --   --   --   --   --  25  GLUCOSE 72 97 115*  < > 78 76 115* 126* 136*  --  139*  BUN 29* 33* 39*  --  35* 33* 31* 32*  --   --  32*  CREATININE 1.62* 1.84* 1.90*  --  1.66* 1.60* 1.40* 1.30*  --  1.60* 1.65*  CALCIUM 8.3* 8.1* 8.1*  --  7.7*  --   --   --   --   --  8.3*  MG  --   --   --   --  1.5*  --   --   --   --  2.1 2.0  PHOS  --   --   --   --   --   --   --   --   --   --  3.2  < > = values in this interval not displayed.  Liver Function Tests:  Recent Labs Lab 04/18/17 0416  AST 38  ALT 14*  ALKPHOS 66  BILITOT 4.5*  PROT 4.7*  ALBUMIN 2.9*   No results for input(s): LIPASE, AMYLASE in the last 168 hours. No results for input(s): AMMONIA in the last 168 hours.  CBC:  Recent Labs Lab 04/16/2017 0451 04/12/2017 1115  03/22/2017 1638  04/12/2017 2033 04/07/2017 2204 04/02/2017 2220 04/18/17 0349 04/18/17 0416  WBC 4.0  --   --  16.1*  --   --   --  14.9* 15.3* 15.7*  NEUTROABS  --   --   --   --   --   --   --   --   --  14.3*  HGB 7.6* 7.8*  < > 8.5*  < > 7.8* 9.2* 9.2* 8.9* 8.8*  HCT 24.2* 24.2*  < > 26.4*  < > 23.0* 27.0* 27.7* 26.2* 26.2*  MCV 87.7  --   --  86.6  --   --   --  82.9 81.6 81.6  PLT 308 299  --  186  --   --   --  132* 140* 138*  < > = values in this interval not displayed.  INR:  Recent Labs Lab 04/18/2017 1027 04/08/2017 1638 04/18/17 0416  INR 1.28 1.46 1.31    Other results:  EKG:    Imaging   Dg Chest 2 View  Result Date: 04/16/2017 CLINICAL DATA:  Congestive heart failure EXAM: CHEST  2 VIEW  COMPARISON:  10/07/2011 FINDINGS: Grossly unchanged enlarged cardiac silhouette and mediastinal contours post median sternotomy and CABG. Single lead left anterior chest wall pacemaker / AICD tip overlies the right ventricular apex. Interval placement of right upper extremity approach PICC line with tip projected over the superior cavoatrial junction. The pulmonary vasculature appears less distinct than present examination with cephalization of flow. No definite pleural effusion. No pneumothorax. No acute osseus abnormalities. Degenerative change of the bilateral glenohumeral joints, incompletely evaluated. Post right-sided rotator cuff repair. IMPRESSION: 1. Suspected mild pulmonary edema. 2. Right upper extremity approach PICC line tip projects over the superior cavoatrial junction. Electronically Signed   By: Sandi Mariscal M.D.   On: 04/16/2017 21:42   Dg Abd 1 View  Result Date: 04/18/2017 CLINICAL DATA:  Enteric tube repositioned EXAM: ABDOMEN - 1 VIEW COMPARISON:  04/08/2017, 04/12/2017. FINDINGS: The enteric tube extends below the diaphragm in curves up into the gastric fundus. Mediastinal and left pleural drains are again evident. Swan-Ganz catheter is again evident. Moderate elevation of the left hemidiaphragm, unchanged. IMPRESSION: The enteric tube extends well into the stomach, with tip in the fundus under the elevated left hemidiaphragm. Electronically Signed   By: Andreas Newport M.D.   On: 04/18/2017 00:31   Dg Chest Port 1 View  Result Date: 04/18/2017 CLINICAL DATA:  Status post LVAD placement EXAM: PORTABLE CHEST 1 VIEW COMPARISON:  03/24/2017 FINDINGS: Endotracheal tube and nasogastric catheter are again seen and stable. Left ventricular assist device is again noted and stable. Defibrillator and Swan-Ganz catheter are noted in satisfactory position. Mediastinal drain and bilateral thoracostomy catheters are noted. No pneumothorax is seen. Lungs are well aerated bilaterally. Improved  aeration in the left base is noted  although persistent density is seen IMPRESSION: Improving aeration in the left base. Stable tubes and lines. Electronically Signed   By: Inez Catalina M.D.   On: 04/18/2017 07:31   Dg Chest Port 1 View  Result Date: 03/22/2017 CLINICAL DATA:  Postop left ventricular assist device EXAM: PORTABLE CHEST 1 VIEW COMPARISON:  03/26/2017 FINDINGS: Endotracheal tube tip is approximately 11 mm superior to the carina. Postsurgical changes of the mediastinum with drainage catheter present. Additional chest drainage catheter tip projects over the right lung apex. Left-sided chest drainage tube tip over the left upper lung. Right IJ Swan-Ganz catheter tip projects over central pulmonary confluence. Partially visualized LVAD. Left-sided pacing device with lead over the RV. There is cardiomegaly with dense left basilar consolidation probable left effusion. Aortic atherosclerosis. No definite pneumothorax is seen. Esophageal tube tip appears to curl of the base of the heart IMPRESSION: 1. Support lines and tubes as above. Uncertain position of the tip of the esophageal tube which appears curled upwards at the left lung base. Repositioning is suggested. 2. Dense left lung base consolidation with probable small left effusion Electronically Signed   By: Donavan Foil M.D.   On: 04/05/2017 23:28   Dg Chest Port 1 View  Result Date: 04/08/2017 CLINICAL DATA:  The patient has undergone placement of the left ventricular assist device. EXAM: PORTABLE CHEST 1 VIEW COMPARISON:  Chest x-ray of April 16, 2017 FINDINGS: The cardiac silhouette is mildly enlarged. The pulmonary vascularity is not engorged. There is no significant pleural effusion and no pneumothorax. The left ventricular assist device is only partially imaged at the inferior margin of the study. The Swan-Ganz catheter tip projects in the main pulmonary outflow tract. The esophagogastric tube tip projects below the inferior margin of the  image but the proximal port is at or just above the GE junction. The endotracheal tube tip lies approximately 4 cm above the carina. The right-sided chest tube tip projects between the posterior right fifth and sixth ribs. The mediastinal drain is in good position projecting over the mid thoracic spine. The left basilar chest tube tip projects just above the medial aspect of the left hemidiaphragm. The left-sided chest tube tip projects over the posterior fifth and sixth rib interspace. The ICD is in stable position. IMPRESSION: No pulmonary edema. Mild left lower lobe atelectasis. No significant pleural effusion and no pneumothorax. The visualized portions of the left ventricular assist device appear appropriately positioned. The other support tubes and lines are in reasonable position. Advancement of the esophagogastric tube by 510 cm is needed to assure that the proximal port lies below the GE junction. Electronically Signed   By: David  Martinique M.D.   On: 03/23/2017 16:45   Dg Abd Portable 1v  Result Date: 04/07/2017 CLINICAL DATA:  OG tube placement EXAM: PORTABLE ABDOMEN - 1 VIEW COMPARISON:  04/12/2017, 03/22/2017 FINDINGS: Postsurgical changes of the mediastinum. Dense left lower lobe consolidation with small left pleural effusion. Multiple mediastinal and chest drainage tubes with LVAD in place. Esophageal tube tip turns upward and projects over the left lung base, appearance could be due to elevated left diaphragm with tip in the proximal stomach IMPRESSION: 1. A esophageal tube tip projects cephalad over the left base, uncertain if this is related to tip being positioned in the proximal stomach with elevation of the left diaphragm as suggested on scout images from prior CT. 2. Small left pleural effusion with dense consolidation in the left lung base. Electronically Signed   By: Maudie Mercury  Francoise Ceo M.D.   On: 04/14/2017 23:32      Medications:     Scheduled Medications: . acetaminophen  1,000 mg  Oral Q6H   Or  . acetaminophen (TYLENOL) oral liquid 160 mg/5 mL  1,000 mg Per Tube Q6H  . amiodarone  200 mg Oral BID  . aspirin EC  325 mg Oral Daily   Or  . aspirin  324 mg Per Tube Daily   Or  . aspirin  300 mg Rectal Daily  . atorvastatin  80 mg Oral q1800  . bisacodyl  10 mg Oral Daily   Or  . bisacodyl  10 mg Rectal Daily  . chlorhexidine gluconate (MEDLINE KIT)  15 mL Mouth Rinse BID  . Chlorhexidine Gluconate Cloth  6 each Topical Q0600  . colchicine  0.6 mg Oral Daily  . digoxin  0.0625 mg Oral Daily  . docusate sodium  200 mg Oral Daily  . febuxostat  120 mg Oral Daily  . feeding supplement  1 Container Oral TID BM  . insulin regular  0-10 Units Intravenous TID WC  . mouth rinse  15 mL Mouth Rinse 10 times per day  . metoCLOPramide (REGLAN) injection  10 mg Intravenous Q6H  . mupirocin ointment  1 application Nasal BID  . [START ON 04/19/2017] pantoprazole  40 mg Oral Daily  . rifampin  600 mg Oral Once  . sodium chloride flush  10-40 mL Intracatheter Q12H  . sodium chloride flush  3 mL Intravenous Q12H     Infusions: . sodium chloride 20 mL/hr at 03/29/2017 1900  . sodium chloride    . sodium chloride 20 mL/hr at 04/18/17 0500  . sodium chloride    . sodium chloride    . albumin human    . cefUROXime (ZINACEF)  IV 1.5 g (04/18/17 0730)  . dexmedetomidine 0.7 mcg/kg/hr (04/18/17 0700)  . EPINEPHrine 4 mg in dextrose 5% 250 mL infusion (16 mcg/mL) 3 mcg/min (04/18/17 0700)  . ferumoxytol Stopped (04/14/17 1236)  . fluconazole (DIFLUCAN) IV Stopped (04/18/17 0851)  . insulin (NOVOLIN-R) infusion 0.2 Units/hr (04/18/17 0729)  . lactated ringers    . lactated ringers    . lactated ringers 20 mL/hr at 04/16/2017 1900  . magnesium sulfate    . milrinone 0.3 mcg/kg/min (04/18/17 0630)  . norepinephrine (LEVOPHED) Adult infusion 8 mcg/min (04/18/17 0700)  . vancomycin       PRN Medications:  sodium chloride, albumin human, lactated ringers, midazolam, morphine  injection, ondansetron (ZOFRAN) IV, oxyCODONE, sodium chloride flush, sodium chloride flush, traMADol   Patient Profile   56 yo with CAD s/p CABG, ischemic cardiomyopathy/chronic systolic CHF, tophaceous gout, and CKD stage 3 was admitted for diuresis and consideration for LVAD placement.   Assessment/Plan:    1. Acute/chronic systolic CHF: Ischemic cardiomyopathy.  St Jude ICD.  Echo (6/18) with EF 15%, mildly dilated RV with moderately decreased systolic function.  RHC 6/22 with elevated filling pressures and evidence of RV failure though PAPi score 1.95 so not prohibitive.  He has been seen at [facility] and turned down for transplant with severe tophaceous gout and concern for worsening on transplant meds.  He was diuresed extensively, repeat RHC 6/26 with marked improvement in filling pressures and stable cardiac output on milrinone.  He had HVAD placement 6/29 and had to return to OR to evacuate mediastinal hematoma.  Stable this morning, now on epinephrine 3, norepinephrine 8, and milrinone 0.3.  CI 2.9, co-ox 78%.  CVP 6 this morning  but weight up a fair amount post-op. LDH 227.  - Wean pressors as able, continue milrinone.  - Wean NO this morning with potential extubation in the afternoon.   - HVAD parameters look ok.  - Will need diuresis eventually, possibly later today.   - Speed at 2700 for now with good cardiac index, may increase eventually.   - On ASA, eventually to start warfarin (per surgery).  2. CKD stage 3: Creatinine stable, 1.6 today.  3. Anemia: Hgb stable at 8.9.  Seen by GI, they do not feel that he needs endoscopy.  FOBT negative.  4. CAD: s/p CABG 2012.  Stable, no chest pain.  5. Gout: Severe tophaceous gout.  On Uloric, will continue colchicine daily.    6. Arrhythmias: PVCs, short runs AT noted on milrinone initially, on po amiodarone pre-op and continue post-op.  7. Malnutrition: Will need to be addressed aggressively.   I reviewed the LVAD parameters from today, and  compared the results to the patient's prior recorded data.  No programming changes were made.  The LVAD is functioning within specified parameters.  The patient performs LVAD self-test daily.  LVAD interrogation was negative for any significant power changes, alarms or PI events/speed drops.  LVAD equipment check completed and is in good working order.  Back-up equipment present.   LVAD education done on emergency procedures and precautions and reviewed exit site care.  CRITICAL CARE Performed by: Loralie Champagne  Total critical care time: 35 minutes  Critical care time was exclusive of separately billable procedures and treating other patients.  Critical care was necessary to treat or prevent imminent or life-threatening deterioration.  Critical care was time spent personally by me on the following activities: development of treatment plan with patient and/or surrogate as well as nursing, discussions with consultants, evaluation of patient's response to treatment, examination of patient, obtaining history from patient or surrogate, ordering and performing treatments and interventions, ordering and review of laboratory studies, ordering and review of radiographic studies, pulse oximetry and re-evaluation of patient's condition.  Length of Stay: 7  Loralie Champagne, MD 04/18/2017, 7:44 AM  VAD Team --- VAD ISSUES ONLY--- Pager [phone] (7am - 7am)  Advanced Heart Failure Team  Pager [phone] (M-F; 7a - 4p)  Please contact [facility] for night-coverage after hours (4p -7a ) and weekends on [email or web address]

## 2017-04-18 NOTE — Anesthesia Postprocedure Evaluation (Signed)
Anesthesia Post Note  Patient: Edd FabianHarry D Labra  Procedure(s) Performed: Procedure(s) (LRB): INSERTION OF IMPLANTABLE LEFT VENTRICULAR ASSIST DEVICE (N/A) TRANSESOPHAGEAL ECHOCARDIOGRAM (TEE) (N/A)     Patient location during evaluation: SICU Anesthesia Type: General Level of consciousness: awake Pain management: pain level controlled Vital Signs Assessment: post-procedure vital signs reviewed and stable Respiratory status: spontaneous breathing Cardiovascular status: stable Anesthetic complications: no Comments: Pt was taken back to OR overnight for bleeding, tolerated well, bleeding controlled. Pt recently extubated with unlabored breathing. He is comfortable with good mentation.     Last Vitals:  Vitals:   04/18/17 1544 04/18/17 1600  BP: (!) 83/63 (!) 89/72  Pulse: 90 89  Resp: 18 17  Temp:  36.6 C    Last Pain:  Vitals:   04/18/17 1200  TempSrc: Core (Comment)  PainSc:                  Shelton SilvasKevin D Shenea Giacobbe

## 2017-04-18 NOTE — Progress Notes (Signed)
Patient ID: James [name], male   DOB: 1960/12/15, 56 y.o.   MRN: [id number]  HVAD Rounding Note  Subjective:    Remains sedated on Precedex on vent. Weaning NO. Now on 15 ppm.  CI 2.9, Co-ox 78.  CVP 5  On Milrinone 0.3, epi 3, levophed 7   LVAD INTERROGATION:  HVAD:  Flow 4 liters/min, speed 2700  Objective:    Vital Signs:   Temp:  [96.1 F (35.6 C)-99.1 F (37.3 C)] 99 F (37.2 C) (06/29 0700) Pulse Rate:  [25-92] 90 (06/29 0700) Resp:  [0-21] 12 (06/29 0700) BP: (61-104)/(42-80) 72/57 (06/29 0600) SpO2:  [93 %-100 %] 100 % (06/29 0700) Arterial Line BP: (62-146)/(59-93) 67/61 (06/29 0700) FiO2 (%):  [40 %-100 %] 50 % (06/29 0700) Weight:  [94.3 kg (207 lb 14.3 oz)] 94.3 kg (207 lb 14.3 oz) (06/29 0630) Last BM Date: 04/16/17 Mean arterial Pressure 70's  Intake/Output:   Intake/Output Summary (Last 24 hours) at 04/18/17 0728 Last data filed at 04/18/17 0700  Gross per 24 hour  Intake         11126.01 ml  Output             5645 ml  Net          5481.01 ml     Physical Exam: General:  Intubated and sedated Cor:  LVAD hum present. Lungs: clear Abd: soft, non-distended Extremities: mild edema Neuro: sedated but reportedly has woken up and followed commands Telemetry: atrial paced 90  Labs: Basic Metabolic Panel:  Recent Labs Lab 03/28/2017 0602 04/16/17 0517 03/31/2017 0451  04/16/2017 1638 04/14/2017 1653 04/16/2017 1943 03/31/2017 2033 04/07/2017 2204 04/01/2017 2220 04/18/17 0416  NA 135 133* 135  < > 137 137 136 137 137  --  135  K 4.3 5.0 4.6  < > 4.1 4.1 4.9 4.7 4.8  --  4.4  CL 96* 95* 95*  --  102 97* 98* 97*  --   --  103  CO2 29 29 30   --  28  --   --   --   --   --  25  GLUCOSE 72 97 115*  < > 78 76 115* 126* 136*  --  139*  BUN 29* 33* 39*  --  35* 33* 31* 32*  --   --  32*  CREATININE 1.62* 1.84* 1.90*  --  1.66* 1.60* 1.40* 1.30*  --  1.60* 1.65*  CALCIUM 8.3* 8.1* 8.1*  --  7.7*  --   --   --   --   --  8.3*  MG  --   --   --   --  1.5*  --   --    --   --  2.1 2.0  PHOS  --   --   --   --   --   --   --   --   --   --  3.2  < > = values in this interval not displayed.  Liver Function Tests:  Recent Labs Lab 04/18/17 0416  AST 38  ALT 14*  ALKPHOS 66  BILITOT 4.5*  PROT 4.7*  ALBUMIN 2.9*   No results for input(s): LIPASE, AMYLASE in the last 168 hours. No results for input(s): AMMONIA in the last 168 hours.  CBC:  Recent Labs Lab 04/01/2017 0451 03/28/2017 1115  03/29/2017 1638  04/08/2017 2033 04/19/2017 2204 04/16/2017 2220 04/18/17 0349 04/18/17 0416  WBC 4.0  --   --  16.1*  --   --   --  14.9* 15.3* 15.7*  NEUTROABS  --   --   --   --   --   --   --   --   --  14.3*  HGB 7.6* 7.8*  < > 8.5*  < > 7.8* 9.2* 9.2* 8.9* 8.8*  HCT 24.2* 24.2*  < > 26.4*  < > 23.0* 27.0* 27.7* 26.2* 26.2*  MCV 87.7  --   --  86.6  --   --   --  82.9 81.6 81.6  PLT 308 299  --  186  --   --   --  132* 140* 138*  < > = values in this interval not displayed.  INR:  Recent Labs Lab 04/14/2017 1027 03/29/2017 1638 04/18/17 0416  INR 1.28 1.46 1.31   LDH 227  Other results:  EKG:   Imaging: Dg Chest 2 View  Result Date: 04/16/2017 CLINICAL DATA:  Congestive heart failure EXAM: CHEST  2 VIEW COMPARISON:  10/07/2011 FINDINGS: Grossly unchanged enlarged cardiac silhouette and mediastinal contours post median sternotomy and CABG. Single lead left anterior chest wall pacemaker / AICD tip overlies the right ventricular apex. Interval placement of right upper extremity approach PICC line with tip projected over the superior cavoatrial junction. The pulmonary vasculature appears less distinct than present examination with cephalization of flow. No definite pleural effusion. No pneumothorax. No acute osseus abnormalities. Degenerative change of the bilateral glenohumeral joints, incompletely evaluated. Post right-sided rotator cuff repair. IMPRESSION: 1. Suspected mild pulmonary edema. 2. Right upper extremity approach PICC line tip projects over the  superior cavoatrial junction. Electronically Signed   By: Sandi Mariscal M.D.   On: 04/16/2017 21:42   Dg Abd 1 View  Result Date: 04/18/2017 CLINICAL DATA:  Enteric tube repositioned EXAM: ABDOMEN - 1 VIEW COMPARISON:  03/29/2017, 04/12/2017. FINDINGS: The enteric tube extends below the diaphragm in curves up into the gastric fundus. Mediastinal and left pleural drains are again evident. Swan-Ganz catheter is again evident. Moderate elevation of the left hemidiaphragm, unchanged. IMPRESSION: The enteric tube extends well into the stomach, with tip in the fundus under the elevated left hemidiaphragm. Electronically Signed   By: Andreas Newport M.D.   On: 04/18/2017 00:31   Dg Chest Port 1 View  Result Date: 04/18/2017 CLINICAL DATA:  Postop left ventricular assist device EXAM: PORTABLE CHEST 1 VIEW COMPARISON:  03/24/2017 FINDINGS: Endotracheal tube tip is approximately 11 mm superior to the carina. Postsurgical changes of the mediastinum with drainage catheter present. Additional chest drainage catheter tip projects over the right lung apex. Left-sided chest drainage tube tip over the left upper lung. Right IJ Swan-Ganz catheter tip projects over central pulmonary confluence. Partially visualized LVAD. Left-sided pacing device with lead over the RV. There is cardiomegaly with dense left basilar consolidation probable left effusion. Aortic atherosclerosis. No definite pneumothorax is seen. Esophageal tube tip appears to curl of the base of the heart IMPRESSION: 1. Support lines and tubes as above. Uncertain position of the tip of the esophageal tube which appears curled upwards at the left lung base. Repositioning is suggested. 2. Dense left lung base consolidation with probable small left effusion Electronically Signed   By: Donavan Foil M.D.   On: 04/07/2017 23:28   Dg Chest Port 1 View  Result Date: 04/10/2017 CLINICAL DATA:  The patient has undergone placement of the left ventricular assist device.  EXAM: PORTABLE CHEST 1 VIEW COMPARISON:  Chest x-ray of April 16, 2017 FINDINGS: The cardiac silhouette is mildly enlarged. The pulmonary vascularity is not engorged. There is no significant pleural effusion and no pneumothorax. The left ventricular assist device is only partially imaged at the inferior margin of the study. The Swan-Ganz catheter tip projects in the main pulmonary outflow tract. The esophagogastric tube tip projects below the inferior margin of the image but the proximal port is at or just above the GE junction. The endotracheal tube tip lies approximately 4 cm above the carina. The right-sided chest tube tip projects between the posterior right fifth and sixth ribs. The mediastinal drain is in good position projecting over the mid thoracic spine. The left basilar chest tube tip projects just above the medial aspect of the left hemidiaphragm. The left-sided chest tube tip projects over the posterior fifth and sixth rib interspace. The ICD is in stable position. IMPRESSION: No pulmonary edema. Mild left lower lobe atelectasis. No significant pleural effusion and no pneumothorax. The visualized portions of the left ventricular assist device appear appropriately positioned. The other support tubes and lines are in reasonable position. Advancement of the esophagogastric tube by 510 cm is needed to assure that the proximal port lies below the GE junction. Electronically Signed   By: David  Martinique M.D.   On: 04/06/2017 16:45   Dg Abd Portable 1v  Result Date: 04/10/2017 CLINICAL DATA:  OG tube placement EXAM: PORTABLE ABDOMEN - 1 VIEW COMPARISON:  04/12/2017, 04/06/2017 FINDINGS: Postsurgical changes of the mediastinum. Dense left lower lobe consolidation with small left pleural effusion. Multiple mediastinal and chest drainage tubes with LVAD in place. Esophageal tube tip turns upward and projects over the left lung base, appearance could be due to elevated left diaphragm with tip in the proximal  stomach IMPRESSION: 1. A esophageal tube tip projects cephalad over the left base, uncertain if this is related to tip being positioned in the proximal stomach with elevation of the left diaphragm as suggested on scout images from prior CT. 2. Small left pleural effusion with dense consolidation in the left lung base. Electronically Signed   By: Donavan Foil M.D.   On: 03/29/2017 23:32      Medications:     Scheduled Medications: . acetaminophen  1,000 mg Oral Q6H   Or  . acetaminophen (TYLENOL) oral liquid 160 mg/5 mL  1,000 mg Per Tube Q6H  . amiodarone  200 mg Oral BID  . aspirin EC  325 mg Oral Daily   Or  . aspirin  324 mg Per Tube Daily   Or  . aspirin  300 mg Rectal Daily  . atorvastatin  80 mg Oral q1800  . bisacodyl  10 mg Oral Daily   Or  . bisacodyl  10 mg Rectal Daily  . chlorhexidine gluconate (MEDLINE KIT)  15 mL Mouth Rinse BID  . Chlorhexidine Gluconate Cloth  6 each Topical Q0600  . colchicine  0.6 mg Oral Daily  . digoxin  0.0625 mg Oral Daily  . docusate sodium  200 mg Oral Daily  . febuxostat  120 mg Oral Daily  . feeding supplement  1 Container Oral TID BM  . insulin regular  0-10 Units Intravenous TID WC  . mouth rinse  15 mL Mouth Rinse 10 times per day  . metoCLOPramide (REGLAN) injection  10 mg Intravenous Q6H  . mupirocin ointment  1 application Nasal BID  . [START ON 04/19/2017] pantoprazole  40 mg Oral Daily  . rifampin  600 mg Oral Once  . sodium  chloride flush  10-40 mL Intracatheter Q12H  . sodium chloride flush  3 mL Intravenous Q12H     Infusions: . sodium chloride 20 mL/hr at 04/18/2017 1900  . sodium chloride    . sodium chloride 20 mL/hr at 04/18/17 0500  . sodium chloride    . sodium chloride    . albumin human    . cefUROXime (ZINACEF)  IV    . dexmedetomidine 0.7 mcg/kg/hr (04/18/17 0700)  . EPINEPHrine 4 mg in dextrose 5% 250 mL infusion (16 mcg/mL) 3 mcg/min (04/18/17 0700)  . ferumoxytol Stopped (04/14/17 1236)  . fluconazole  (DIFLUCAN) IV 400 mg (04/18/17 0651)  . insulin (NOVOLIN-R) infusion Stopped (04/18/17 0700)  . lactated ringers    . lactated ringers    . lactated ringers 20 mL/hr at 04/16/2017 1900  . magnesium sulfate    . milrinone 0.3 mcg/kg/min (04/18/17 0630)  . norepinephrine (LEVOPHED) Adult infusion 8 mcg/min (04/18/17 0700)  . vancomycin       PRN Medications:  sodium chloride, albumin human, lactated ringers, midazolam, morphine injection, ondansetron (ZOFRAN) IV, oxyCODONE, sodium chloride flush, sodium chloride flush, traMADol   Assessment/Plan/Discussion:    POD 1 s/p HVAD for ischemic cardiomyopathy with acute on chronic systolic heart failure, EF 15% with moderate RV dysfunction. Returned to OR for bleeding from raw mediastinal tissues. Hemodynamics look good this morning and RV looked ok in the OR. Continue current inotropic/vasopressor support. Current speed should be ok with good CO and flow.  Possible start coumadin tonight but will wait to see how chest tube output is over the course of today.  Stage 3 CKD: creat stable. Continue to follow. Hold on diuresis for now while on vasopressors.  DM: preop Hgb A1c 5.9. Glucose under good control. Will start Levemir and SSI.  Chronic anemia felt to be due to chronic disease. Transfuse as needed and resume iron.   Gout: continue previous meds when able to take po.  Hx of arrhythmias preop: Will resume po amio when able. ICD in place but will wait to turn on until next week.  Malnutrition: Hopefully will get extubated today and then start taking po's. He has pressure sore on right leg and buttock.    I reviewed the LVAD parameters from today, and compared the results to the patient's prior recorded data.   LVAD equipment check completed and is in good working order.  Back-up equipment present.     Length of Stay: 7  Gaye Pollack 04/18/2017, 7:28 AM

## 2017-04-18 NOTE — Progress Notes (Signed)
OT NOTE- LATE GCODE ENTRY     03/03/17 1400  OT G-codes **NOT FOR INPATIENT CLASS**  Functional Assessment Tool Used Clinical judgement  Functional Limitation Self care  Self Care Current Status ([id number]) CJ  Self Care Discharge Status [phone]([id number]) Alyce PaganJ         Kalem Rockwell, Brynn   OTR/L Pager: [phone] Office: [phone] .

## 2017-04-18 NOTE — Progress Notes (Signed)
LVAD Inpatient Coordinator Rounding Note:  Admitted 08/02/17 due to A/C Heart failure.   [facility]LVAD implated on 04/08/2017 by Dr. Laneta SimmersBartle.   Vital signs: HR: 89 Doppler Pressure:76 Automatic BP: 102/75 (84) O2 Sat: 100 Wt:207     LVAD interrogation reveals:  Speed:2700 Flow: 4.8 Power:  4.2  Alarms: none Peak: 7 Trough:3.5 HCT: 26 Low flow alarm setting:2 High watt alarm setting:6  Blood Products: 6/28>4 PRBC's, 6 FFP  Drive Line:Daily Dressing Kits with Aquacell AG silver strips per protocol.   Labs:  LDH trend:160 (pre VAD)>227  INR trend: 1.31  Anticoagulation Plan: -INR Goal: 2-2.5 (eventually will start warfarin per surgery) -ASA Dose: 325 mg  Adverse Events on VAD: -Return to OR 6/28  with high chest tube output, evacuation of mediastinal hematoma  Plan/Recommendations:   1. Daily dressing changes with aquacell silver strips per protocol.  Marcellus ScottLesley Wilson RN, VAD Coordinator 24/7 pager [phone]

## 2017-04-18 NOTE — Progress Notes (Signed)
Rapid wean protocol initiated at 1425.

## 2017-04-19 ENCOUNTER — Inpatient Hospital Stay (HOSPITAL_COMMUNITY): Payer: Medicare PPO

## 2017-04-19 ENCOUNTER — Encounter (HOSPITAL_COMMUNITY): Payer: Self-pay | Admitting: *Deleted

## 2017-04-19 DIAGNOSIS — Z95811 Presence of heart assist device: Secondary | ICD-10-CM | POA: Diagnosis not present

## 2017-04-19 DIAGNOSIS — N179 Acute kidney failure, unspecified: Secondary | ICD-10-CM | POA: Diagnosis not present

## 2017-04-19 DIAGNOSIS — I5023 Acute on chronic systolic (congestive) heart failure: Secondary | ICD-10-CM | POA: Diagnosis not present

## 2017-04-19 LAB — COMPREHENSIVE METABOLIC PANEL
ALBUMIN: 2.5 g/dL — AB (ref 3.5–5.0)
ALT: 13 U/L — AB (ref 17–63)
AST: 39 U/L (ref 15–41)
Alkaline Phosphatase: 80 U/L (ref 38–126)
Anion gap: 10 (ref 5–15)
BUN: 32 mg/dL — ABNORMAL HIGH (ref 6–20)
CALCIUM: 8.5 mg/dL — AB (ref 8.9–10.3)
CHLORIDE: 101 mmol/L (ref 101–111)
CO2: 24 mmol/L (ref 22–32)
CREATININE: 1.87 mg/dL — AB (ref 0.61–1.24)
GFR calc Af Amer: 45 mL/min — ABNORMAL LOW (ref >60)
GFR calc non Af Amer: 39 mL/min — ABNORMAL LOW (ref >60)
GLUCOSE: 119 mg/dL — AB (ref 65–99)
Potassium: 4.3 mmol/L (ref 3.5–5.1)
SODIUM: 135 mmol/L (ref 135–145)
Total Bilirubin: 4.1 mg/dL — ABNORMAL HIGH (ref 0.3–1.2)
Total Protein: 5 g/dL — ABNORMAL LOW (ref 6.5–8.1)

## 2017-04-19 LAB — GLUCOSE, CAPILLARY
GLUCOSE-CAPILLARY: 116 mg/dL — AB (ref 65–99)
GLUCOSE-CAPILLARY: 160 mg/dL — AB (ref 65–99)
GLUCOSE-CAPILLARY: 73 mg/dL (ref 65–99)
Glucose-Capillary: 120 mg/dL — ABNORMAL HIGH (ref 65–99)
Glucose-Capillary: 113 mg/dL — ABNORMAL HIGH (ref 65–99)
Glucose-Capillary: 148 mg/dL — ABNORMAL HIGH (ref 65–99)
Glucose-Capillary: 137 mg/dL — ABNORMAL HIGH (ref 65–99)

## 2017-04-19 LAB — CBC WITH DIFFERENTIAL/PLATELET
BASOS PCT: 0 %
Basophils Absolute: 0 10*3/uL (ref 0.0–0.1)
EOS ABS: 0 10*3/uL (ref 0.0–0.7)
EOS PCT: 0 %
HEMATOCRIT: 24 % — AB (ref 39.0–52.0)
Hemoglobin: 7.9 g/dL — ABNORMAL LOW (ref 13.0–17.0)
LYMPHS PCT: 8 %
Lymphs Abs: 1.5 10*3/uL (ref 0.7–4.0)
MCH: 27.7 pg (ref 26.0–34.0)
MCHC: 32.9 g/dL (ref 30.0–36.0)
MCV: 84.2 fL (ref 78.0–100.0)
MONO ABS: 0.9 10*3/uL (ref 0.1–1.0)
Monocytes Relative: 5 %
NEUTROS ABS: 16.5 10*3/uL — AB (ref 1.7–7.7)
Neutrophils Relative %: 87 %
Platelets: 140 10*3/uL — ABNORMAL LOW (ref 150–400)
RBC: 2.85 MIL/uL — ABNORMAL LOW (ref 4.22–5.81)
RDW: 18.7 % — AB (ref 11.5–15.5)
WBC: 18.9 10*3/uL — ABNORMAL HIGH (ref 4.0–10.5)

## 2017-04-19 LAB — POCT I-STAT 3, ART BLOOD GAS (G3+)
ACID-BASE EXCESS: 2 mmol/L (ref 0.0–2.0)
BICARBONATE: 26.8 mmol/L (ref 20.0–28.0)
O2 Saturation: 98 %
TCO2: 28 mmol/L (ref 0–100)
pCO2 arterial: 40.6 mmHg (ref 32.0–48.0)
pH, Arterial: 7.425 (ref 7.350–7.450)
pO2, Arterial: 109 mmHg — ABNORMAL HIGH (ref 83.0–108.0)

## 2017-04-19 LAB — PROTIME-INR
INR: 1.45
Prothrombin Time: 17.8 seconds — ABNORMAL HIGH (ref 11.4–15.2)

## 2017-04-19 LAB — LACTATE DEHYDROGENASE: LDH: 215 U/L — AB (ref 98–192)

## 2017-04-19 LAB — COOXEMETRY PANEL
Carboxyhemoglobin: 1.6 % — ABNORMAL HIGH (ref 0.5–1.5)
Methemoglobin: 1.5 % (ref 0.0–1.5)
O2 SAT: 73.2 %
Total hemoglobin: 7.5 g/dL — ABNORMAL LOW (ref 12.0–16.0)

## 2017-04-19 LAB — MAGNESIUM: MAGNESIUM: 2 mg/dL (ref 1.7–2.4)

## 2017-04-19 LAB — PHOSPHORUS: PHOSPHORUS: 4.8 mg/dL — AB (ref 2.5–4.6)

## 2017-04-19 MED ORDER — BACITRACIN-NEOMYCIN-POLYMYXIN OINTMENT TUBE
TOPICAL_OINTMENT | CUTANEOUS | Status: DC | PRN
  Administered 2017-04-20: 06:00:00 via TOPICAL
  Filled 2017-04-19 (×3): qty 14.17

## 2017-04-19 MED ORDER — WARFARIN - PHYSICIAN DOSING INPATIENT
Freq: Every day | Status: DC
  Administered 2017-04-19 – 2017-04-23 (×5)

## 2017-04-19 MED ORDER — ORAL CARE MOUTH RINSE
15.0000 mL | Freq: Two times a day (BID) | OROMUCOSAL | Status: DC
  Administered 2017-04-19 – 2017-04-21 (×4): 15 mL via OROMUCOSAL

## 2017-04-19 MED ORDER — GUAIFENESIN ER 600 MG PO TB12
600.0000 mg | ORAL_TABLET | Freq: Two times a day (BID) | ORAL | Status: DC
  Administered 2017-04-19 – 2017-04-22 (×8): 600 mg via ORAL
  Filled 2017-04-19 (×9): qty 1

## 2017-04-19 MED ORDER — WARFARIN SODIUM 2.5 MG PO TABS
2.5000 mg | ORAL_TABLET | Freq: Once | ORAL | Status: AC
  Administered 2017-04-19: 2.5 mg via ORAL
  Filled 2017-04-19: qty 1

## 2017-04-19 MED ORDER — FUROSEMIDE 10 MG/ML IJ SOLN
4.0000 mg/h | INTRAVENOUS | Status: DC
  Administered 2017-04-19: 4 mg/h via INTRAVENOUS
  Filled 2017-04-19: qty 25

## 2017-04-19 NOTE — Evaluation (Addendum)
Physical Therapy Evaluation Patient Details Name: James [name] MRN: [id number] DOB: [birth year] Today's Date: 04/19/2017   History of Present Illness  56 y.o. male  admitted on 04/12/2017 with significant PMH for gout, DMII, HTN, CKD, CAD S/P CABGx6 2012, chronic systolic heart failure and [employer] jude ICD. presents now s/p heatmate LVAD implant  Clinical Impression  Orders received for PT evaluation. Patient demonstrates deficits in functional mobility as indicated below. Will benefit from continued skilled PT to address deficits and maximize function. Will see as indicated and progress as tolerated.  At this time, patient able to tolerate EOB ~15 minutes, and performed some LE activity during sittng balance. Prior to admission patient was independent with activity, anticipate as patient progresses will be appropriate for d/c home with 24/7 assist initially and HHPT.  Nsg in room during session, VSS    Follow Up Recommendations Home health PT;Supervision/Assistance - 24 hour    Equipment Recommendations   (TBD)    Recommendations for Other Services       Precautions / Restrictions Precautions Precautions: Sternal Precaution Comments: LVAD (heartmate) Restrictions Weight Bearing Restrictions: No      Mobility  Bed Mobility Overal bed mobility: Needs Assistance Bed Mobility: Supine to Sit;Sit to Supine     Supine to sit: Max assist;+2 for physical assistance Sit to supine: Max assist;+2 for physical assistance   General bed mobility comments: max assist of 2 persons to elevate trunk to upright position at EOB, patient able to initiate LE movement to EOB. Similar assist to return to supine.   Transfers                 General transfer comment: deferred at this time (session limited to Dangle EOB  Ambulation/Gait             General Gait Details: deferred at this time  Stairs            Wheelchair Mobility    Modified Rankin (Stroke Patients Only)        Balance Overall balance assessment: Needs assistance Sitting-balance support: No upper extremity supported;Feet supported Sitting balance-Leahy Scale: Fair Sitting balance - Comments: able to sit EOB with min guard, unable to take on perturbation or increased challange outside of BOS at this time                                     Pertinent Vitals/Pain Pain Assessment: Faces Pain Location: sternal/chest pain, gout pain in joints Pain Descriptors / Indicators: Aching;Discomfort;Grimacing;Guarding;Sore    Home Living Family/patient expects to be discharged to:: Private residence Living Arrangements: Spouse/significant other Available Help at Discharge: Family;Available PRN/intermittently Type of Home: House Home Access: Level entry     Home Layout: Multi-level Home Equipment: Bedside commode;Crutches Additional Comments: pt reports deficits with peri care due to hand strength at times    Prior Function Level of Independence: Independent         Comments: was independent prior to admission, walking laps and going to the grocery store     Hand Dominance   Dominant Hand: Right    Extremity/Trunk Assessment   Upper Extremity Assessment Upper Extremity Assessment: Generalized weakness    Lower Extremity Assessment Lower Extremity Assessment: Defer to PT evaluation RLE Deficits / Details: noted gout in bilateral LEs liiting ROM RLE Coordination: decreased fine motor LLE Deficits / Details: noted gout in bilateral LEs limiting ROM LLE Coordination: decreased  fine motor    Cervical / Trunk Assessment Cervical / Trunk Assessment: Other exceptions (generalized weakness )  Communication   Communication: No difficulties  Cognition Arousal/Alertness: Awake/alert Behavior During Therapy: WFL for tasks assessed/performed Overall Cognitive Status: Within Functional Limits for tasks assessed                                        General  Comments General comments (skin integrity, edema, etc.): patient tolerated EOB ~15 minutes    Exercises General Exercises - Lower Extremity Ankle Circles/Pumps: AROM;10 reps;Supine Long Arc Quad: Both;5 reps;Seated (limited ROM noted with LE movement) Hip Flexion/Marching: Both;5 reps;Seated (limited ROM noted with LE movement)   Assessment/Plan    PT Assessment Patient needs continued PT services  PT Problem List Decreased strength;Decreased range of motion;Decreased activity tolerance;Decreased balance;Decreased mobility;Decreased knowledge of precautions;Cardiopulmonary status limiting activity;Pain       PT Treatment Interventions DME instruction;Gait training;Stair training;Functional mobility training;Therapeutic activities;Therapeutic exercise;Balance training;Patient/family education    PT Goals (Current goals can be found in the Care Plan section)  Acute Rehab PT Goals Patient Stated Goal: to go home PT Goal Formulation: With patient Time For Goal Achievement: 05/03/17 Potential to Achieve Goals: Good    Frequency Min 3X/week   Barriers to discharge        Co-evaluation PT/OT/SLP Co-Evaluation/Treatment: Yes Reason for Co-Treatment: Complexity of the patient's impairments (multi-system involvement);Necessary to address cognition/behavior during functional activity;For patient/therapist safety PT goals addressed during session: Mobility/safety with mobility;Balance         AM-PAC PT "6 Clicks" Daily Activity  Outcome Measure Difficulty turning over in bed (including adjusting bedclothes, sheets and blankets)?: Total Difficulty moving from lying on back to sitting on the side of the bed? : Total Difficulty sitting down on and standing up from a chair with arms (e.g., wheelchair, bedside commode, etc,.)?: Total Help needed moving to and from a bed to chair (including a wheelchair)?: A Lot Help needed walking in hospital room?: A Lot Help needed climbing 3-5 steps  with a railing? : A Lot 6 Click Score: 9    End of Session   Activity Tolerance: Patient limited by fatigue;Patient limited by pain Patient left: in bed;with call bell/phone within reach;with nursing/sitter in room Nurse Communication: Mobility status PT Visit Diagnosis: Unsteadiness on feet (R26.81);Muscle weakness (generalized) (M62.81);Pain Pain - part of body:  (joints and chest (incisional pain))    Time: 0818-0905 PT Time Calculation (min) (ACUTE ONLY): 47 min   Charges:   PT Evaluation $PT Eval High Complexity: 1 Procedure     PT G Codes:        Charlotte Crumbevon Wade Asebedo, PT DPT [facility] [phone]   Fabio AsaDevon J Sonnie Bias 04/19/2017, 9:24 AM

## 2017-04-19 NOTE — Progress Notes (Signed)
Patient ID: James [name], male   DOB: [birth year], 56 y.o.   MRN: [id number]    HVAD Rounding Note  Subjective:    Feels ok, sore, stronger than yesterday  Co-ox 73% on milrinone 0.3, epi 3, Levo 4, NO 3ppm.  CVP 5-6 PA 42/16   LVAD INTERROGATION:  HVAD:  Flow 5.2 liters/min, speed 2760  Objective:    Vital Signs:   Temp:  [97.3 F (36.3 C)-99.1 F (37.3 C)] 98.1 F (36.7 C) (06/30 0800) Pulse Rate:  [88-96] 89 (06/30 1100) Resp:  [12-26] 18 (06/30 1100) BP: (83-121)/(63-91) 105/82 (06/30 1100) SpO2:  [95 %-100 %] 100 % (06/30 1100) Arterial Line BP: (73-127)/(57-80) 92/62 (06/30 0800) FiO2 (%):  [40 %] 40 % (06/29 1500) Weight:  [95.4 kg (210 lb 5.1 oz)] 95.4 kg (210 lb 5.1 oz) (06/30 0600) Last BM Date: 04/16/17 Mean arterial Pressure 80's  Intake/Output:   Intake/Output Summary (Last 24 hours) at 04/19/17 1131 Last data filed at 04/19/17 1000  Gross per 24 hour  Intake          3039.07 ml  Output             1650 ml  Net          1389.07 ml     Physical Exam: General:  Well appearing but weak, No resp difficulty HEENT: normal Cor: Normal heart sounds with LVAD hum present. Rub from tubes Lungs: diminished in lower lobes Abdomen: soft, nontender, nondistended. No hepatosplenomegaly. No bruits or masses. Good bowel sounds. Extremities: moderate edema Neuro: alert & orientedx3, cranial nerves grossly intact. moves all 4 extremities w/o difficulty. Affect pleasant  Telemetry: atrial paced 90  Labs: Basic Metabolic Panel:  Recent Labs Lab 04/16/17 0517 04/09/2017 0451  04/14/2017 1638 04/07/2017 1653 04/16/2017 1943 04/07/2017 2033 03/24/2017 2204 04/07/2017 2220 04/18/17 0416 04/18/17 1640 04/19/17 0304  NA 133* 135  < > 137 137 136 137 137  --  135  --  135  K 5.0 4.6  < > 4.1 4.1 4.9 4.7 4.8  --  4.4  --  4.3  CL 95* 95*  < > 102 97* 98* 97*  --   --  103  --  101  CO2 29 30  --  28  --   --   --   --   --  25  --  24  GLUCOSE 97 115*  < > 78 76 115* 126*  136*  --  139*  --  119*  BUN 33* 39*  < > 35* 33* 31* 32*  --   --  32*  --  32*  CREATININE 1.84* 1.90*  < > 1.66* 1.60* 1.40* 1.30*  --  1.60* 1.65* 1.87* 1.87*  CALCIUM 8.1* 8.1*  --  7.7*  --   --   --   --   --  8.3*  --  8.5*  MG  --   --   --  1.5*  --   --   --   --  2.1 2.0 2.0 2.0  PHOS  --   --   --   --   --   --   --   --   --  3.2  --  4.8*  < > = values in this interval not displayed.  Liver Function Tests:  Recent Labs Lab 04/18/17 0416 04/19/17 0304  AST 38 39  ALT 14* 13*  ALKPHOS 66 80  BILITOT 4.5* 4.1*  PROT 4.7* 5.0*  ALBUMIN 2.9* 2.5*   No results for input(s): LIPASE, AMYLASE in the last 168 hours. No results for input(s): AMMONIA in the last 168 hours.  CBC:  Recent Labs Lab 04/14/2017 2220 04/18/17 0349 04/18/17 0416 04/18/17 1640 04/19/17 0304  WBC 14.9* 15.3* 15.7* 17.5* 18.9*  NEUTROABS  --   --  14.3*  --  16.5*  HGB 9.2* 8.9* 8.8* 8.6* 7.9*  HCT 27.7* 26.2* 26.2* 26.1* 24.0*  MCV 82.9 81.6 81.6 84.7 84.2  PLT 132* 140* 138* 148* 140*    INR:  Recent Labs Lab 04/06/2017 1638 04/18/17 0416 04/19/17 0304  INR 1.46 1.31 1.45    Other results:  EKG:   Imaging: Dg Abd 1 View  Result Date: 04/18/2017 CLINICAL DATA:  Enteric tube repositioned EXAM: ABDOMEN - 1 VIEW COMPARISON:  03/28/2017, 04/12/2017. FINDINGS: The enteric tube extends below the diaphragm in curves up into the gastric fundus. Mediastinal and left pleural drains are again evident. Swan-Ganz catheter is again evident. Moderate elevation of the left hemidiaphragm, unchanged. IMPRESSION: The enteric tube extends well into the stomach, with tip in the fundus under the elevated left hemidiaphragm. Electronically Signed   By: Andreas Newport M.D.   On: 04/18/2017 00:31   Dg Chest Port 1 View  Result Date: 04/19/2017 CLINICAL DATA:  Chest tube, LEFT ventricular assist device EXAM: PORTABLE CHEST 1 VIEW COMPARISON:  Portable exam 0532 hours compared to 04/18/2017 FINDINGS:  Interval removal of endotracheal and nasogastric tubes. LEFT ventricular assist device projects at cardiac apex. RIGHT jugular Swan-Ganz catheter with tip projecting over proximal RIGHT pulmonary artery. Mediastinal drain and BILATERAL thoracostomy tubes again seen. LEFT subclavian transvenous pacemaker/ AICD lead with tip projecting over RIGHT ventricle. Enlargement of cardiac silhouette with pulmonary vascular congestion. Perihilar edema slightly increased. Associated bibasilar atelectasis greater in LEFT lower lobe. Question small RIGHT pleural effusion. No pneumothorax. Bones demineralized. IMPRESSION: Postoperative changes as above with slightly increased pulmonary edema and bibasilar atelectasis. Electronically Signed   By: Lavonia Dana M.D.   On: 04/19/2017 09:20   Dg Chest Port 1 View  Result Date: 04/18/2017 CLINICAL DATA:  Status post LVAD placement EXAM: PORTABLE CHEST 1 VIEW COMPARISON:  03/31/2017 FINDINGS: Endotracheal tube and nasogastric catheter are again seen and stable. Left ventricular assist device is again noted and stable. Defibrillator and Swan-Ganz catheter are noted in satisfactory position. Mediastinal drain and bilateral thoracostomy catheters are noted. No pneumothorax is seen. Lungs are well aerated bilaterally. Improved aeration in the left base is noted although persistent density is seen IMPRESSION: Improving aeration in the left base. Stable tubes and lines. Electronically Signed   By: Inez Catalina M.D.   On: 04/18/2017 07:31   Dg Chest Port 1 View  Result Date: 04/12/2017 CLINICAL DATA:  Postop left ventricular assist device EXAM: PORTABLE CHEST 1 VIEW COMPARISON:  04/19/2017 FINDINGS: Endotracheal tube tip is approximately 11 mm superior to the carina. Postsurgical changes of the mediastinum with drainage catheter present. Additional chest drainage catheter tip projects over the right lung apex. Left-sided chest drainage tube tip over the left upper lung. Right IJ Swan-Ganz  catheter tip projects over central pulmonary confluence. Partially visualized LVAD. Left-sided pacing device with lead over the RV. There is cardiomegaly with dense left basilar consolidation probable left effusion. Aortic atherosclerosis. No definite pneumothorax is seen. Esophageal tube tip appears to curl of the base of the heart IMPRESSION: 1. Support lines and tubes as above. Uncertain position of the tip of the esophageal  tube which appears curled upwards at the left lung base. Repositioning is suggested. 2. Dense left lung base consolidation with probable small left effusion Electronically Signed   By: Donavan Foil M.D.   On: 04/01/2017 23:28   Dg Chest Port 1 View  Result Date: 03/24/2017 CLINICAL DATA:  The patient has undergone placement of the left ventricular assist device. EXAM: PORTABLE CHEST 1 VIEW COMPARISON:  Chest x-ray of April 16, 2017 FINDINGS: The cardiac silhouette is mildly enlarged. The pulmonary vascularity is not engorged. There is no significant pleural effusion and no pneumothorax. The left ventricular assist device is only partially imaged at the inferior margin of the study. The Swan-Ganz catheter tip projects in the main pulmonary outflow tract. The esophagogastric tube tip projects below the inferior margin of the image but the proximal port is at or just above the GE junction. The endotracheal tube tip lies approximately 4 cm above the carina. The right-sided chest tube tip projects between the posterior right fifth and sixth ribs. The mediastinal drain is in good position projecting over the mid thoracic spine. The left basilar chest tube tip projects just above the medial aspect of the left hemidiaphragm. The left-sided chest tube tip projects over the posterior fifth and sixth rib interspace. The ICD is in stable position. IMPRESSION: No pulmonary edema. Mild left lower lobe atelectasis. No significant pleural effusion and no pneumothorax. The visualized portions of the left  ventricular assist device appear appropriately positioned. The other support tubes and lines are in reasonable position. Advancement of the esophagogastric tube by 510 cm is needed to assure that the proximal port lies below the GE junction. Electronically Signed   By: David  Martinique M.D.   On: 04/10/2017 16:45   Dg Abd Portable 1v  Result Date: 03/23/2017 CLINICAL DATA:  OG tube placement EXAM: PORTABLE ABDOMEN - 1 VIEW COMPARISON:  04/12/2017, 03/29/2017 FINDINGS: Postsurgical changes of the mediastinum. Dense left lower lobe consolidation with small left pleural effusion. Multiple mediastinal and chest drainage tubes with LVAD in place. Esophageal tube tip turns upward and projects over the left lung base, appearance could be due to elevated left diaphragm with tip in the proximal stomach IMPRESSION: 1. A esophageal tube tip projects cephalad over the left base, uncertain if this is related to tip being positioned in the proximal stomach with elevation of the left diaphragm as suggested on scout images from prior CT. 2. Small left pleural effusion with dense consolidation in the left lung base. Electronically Signed   By: Donavan Foil M.D.   On: 04/06/2017 23:32      Medications:     Scheduled Medications: . acetaminophen  1,000 mg Oral Q6H   Or  . acetaminophen (TYLENOL) oral liquid 160 mg/5 mL  1,000 mg Per Tube Q6H  . amiodarone  200 mg Oral BID  . aspirin EC  325 mg Oral Daily   Or  . aspirin  324 mg Per Tube Daily   Or  . aspirin  300 mg Rectal Daily  . atorvastatin  80 mg Oral q1800  . bisacodyl  10 mg Oral Daily   Or  . bisacodyl  10 mg Rectal Daily  . chlorhexidine gluconate (MEDLINE KIT)  15 mL Mouth Rinse BID  . Chlorhexidine Gluconate Cloth  6 each Topical Q0600  . colchicine  0.6 mg Oral Daily  . digoxin  0.0625 mg Oral Daily  . docusate sodium  200 mg Oral Daily  . febuxostat  120 mg Oral Daily  .  guaiFENesin  600 mg Oral BID  . insulin aspart  0-24 Units Subcutaneous  Q4H  . insulin detemir  15 Units Subcutaneous Daily  . mouth rinse  15 mL Mouth Rinse 10 times per day  . metoCLOPramide (REGLAN) injection  10 mg Intravenous Q6H  . mupirocin ointment  1 application Nasal BID  . pantoprazole  40 mg Oral Daily  . sodium chloride flush  10-40 mL Intracatheter Q12H  . sodium chloride flush  3 mL Intravenous Q12H  . warfarin  2.5 mg Oral ONCE-1800  . Warfarin - Physician Dosing Inpatient   Does not apply q1800     Infusions: . sodium chloride 20 mL/hr at 04/19/17 1000  . sodium chloride 250 mL (04/19/17 1000)  . sodium chloride 20 mL/hr at 04/19/17 1000  . sodium chloride    . sodium chloride    . EPINEPHrine 4 mg in dextrose 5% 250 mL infusion (16 mcg/mL) 3.013 mcg/min (04/19/17 1000)  . ferumoxytol Stopped (04/14/17 1236)  . furosemide (LASIX) infusion    . lactated ringers    . lactated ringers 20 mL/hr at 04/19/17 1000  . magnesium sulfate    . milrinone 0.3 mcg/kg/min (04/19/17 1005)  . norepinephrine (LEVOPHED) Adult infusion 4 mcg/min (04/19/17 1000)  . vancomycin Stopped (04/19/17 1103)     PRN Medications:  sodium chloride, morphine injection, ondansetron (ZOFRAN) IV, oxyCODONE, sodium chloride flush, sodium chloride flush, traMADol  Assessment/Plan/Discussion:    POD 2 s/p HVAD for ischemic cardiomyopathy with acute on chronic systolic heart failure, EF 15% with moderate RV dysfunction. Returned to OR for bleeding from raw mediastinal tissues. Hemodynamics look good this morning and speed increased. Arterial tracing is pulsatile. Wean levophed, then epi. Wean off NO  Plan to start coumadin today 2.5 mg  Stage 3 CKD: creat slightly higher today to 1.8. Continue to follow. Wt is 15 lbs over preop. Started on lasix drip today.  DM: preop Hgb A1c 5.9. Glucose under good control. Continue Levemir and SSI.  Chronic anemia felt to be due to chronic disease.  Hgb down slightly today probably due to equilibration and volume excess.  Will observe for now. Transfuse as needed and resume iron.   Gout: continue previous meds. May have a flair with diuretic.  Hx of arrhythmias preop: continue po amio.  ICD in place but will wait to turn on until next week.  Malnutrition: encourage po nutrition. He has pressure sore on right leg and buttock.  Remove pleural tubes today.  OOB to chair.  I reviewed the LVAD parameters from today, and compared the results to the patient's prior recorded data.   LVAD equipment check completed and is in good working order.  Back-up equipment present.      Length of Stay: 8  Gaye Pollack 04/19/2017, 11:31 AM

## 2017-04-19 NOTE — Evaluation (Signed)
Occupational Therapy Evaluation Patient Details Name: James [name] MRN: [id number] DOB: 1961/02/07 Today's Date: 04/19/2017    History of Present Illness 56 y.o. male  admitted on 04/13/2017 with significant PMH for gout, DMII, HTN, CKD, CAD S/P CABGx6 2012, chronic systolic heart failure and [employer] ICD. presents now s/p heatmate LVAD implant   Clinical Impression   Pt admitted with above. He demonstrates the below listed deficits and will benefit from continued OT to maximize safety and independence with BADLs. Pt limited to dangling EOB this date.  He required max A +2 for bed mobility.  He was very independent PTA and has supportive spouse.  Will follow.       Follow Up Recommendations  Home health OT;Supervision/Assistance - 24 hour    Equipment Recommendations  3 in 1 bedside commode    Recommendations for Other Services       Precautions / Restrictions Precautions Precautions: Sternal Precaution Comments: LVAD (heartmate) Restrictions Weight Bearing Restrictions: No      Mobility Bed Mobility Overal bed mobility: Needs Assistance Bed Mobility: Supine to Sit;Sit to Supine     Supine to sit: Max assist;+2 for physical assistance Sit to supine: Max assist;+2 for physical assistance   General bed mobility comments: max assist of 2 persons to elevate trunk to upright position at EOB, patient able to initiate LE movement to EOB. Similar assist to return to supine.   Transfers                 General transfer comment: deferred at this time (session limited to Dangle EOB    Balance Overall balance assessment: Needs assistance Sitting-balance support: No upper extremity supported;Feet supported Sitting balance-Leahy Scale: Fair Sitting balance - Comments: able to sit EOB with min guard, unable to take on perturbation or increased challange outside of BOS at this time                                   ADL either performed or assessed with  clinical judgement   ADL Overall ADL's : Needs assistance/impaired Eating/Feeding: NPO   Grooming: Wash/dry hands;Wash/dry face;Oral care   Upper Body Bathing: Total assistance;Bed level   Lower Body Bathing: Total assistance;Bed level   Upper Body Dressing : Total assistance;Bed level   Lower Body Dressing: Total assistance;Bed level   Toilet Transfer: Total assistance Toilet Transfer Details (indicate cue type and reason): unable  Toileting- Clothing Manipulation and Hygiene: Total assistance;Bed level       Functional mobility during ADLs: Maximal assistance;+2 for safety/equipment (bed mobility ) General ADL Comments: Pt limited by tubes and lines      Vision         Perception     Praxis      Pertinent Vitals/Pain Pain Assessment: Faces Pain Location: sternal/chest pain, gout pain in joints Pain Descriptors / Indicators: Aching;Discomfort;Grimacing;Guarding;Sore     Hand Dominance Right   Extremity/Trunk Assessment Upper Extremity Assessment Upper Extremity Assessment: Generalized weakness   Lower Extremity Assessment Lower Extremity Assessment: Defer to PT evaluation RLE Deficits / Details: noted gout in bilateral LEs liiting ROM RLE Coordination: decreased fine motor LLE Deficits / Details: noted gout in bilateral LEs limiting ROM LLE Coordination: decreased fine motor   Cervical / Trunk Assessment Cervical / Trunk Assessment: Other exceptions (generalized weakness )   Communication Communication Communication: No difficulties   Cognition Arousal/Alertness: Awake/alert Behavior During Therapy: WFL for tasks  assessed/performed Overall Cognitive Status: Within Functional Limits for tasks assessed                                     General Comments  patient tolerated EOB ~15 minutes    Exercises General Exercises - Lower Extremity Ankle Circles/Pumps: AROM;10 reps;Supine Long Arc Quad: Both;5 reps;Seated (limited ROM noted with  LE movement) Hip Flexion/Marching: Both;5 reps;Seated (limited ROM noted with LE movement)   Shoulder Instructions      Home Living Family/patient expects to be discharged to:: Private residence Living Arrangements: Spouse/significant other Available Help at Discharge: Family;Available PRN/intermittently Type of Home: House Home Access: Level entry     Home Layout: Multi-level Alternate Level Stairs-Number of Steps: 4-6 Alternate Level Stairs-Rails: Right;Left Bathroom Shower/Tub: Producer, television/film/video: Handicapped height     Home Equipment: Bedside commode;Crutches   Additional Comments: pt reports deficits with peri care due to hand strength at times      Prior Functioning/Environment Level of Independence: Independent        Comments: was independent prior to admission, walking laps and going to the grocery store        OT Problem List: Decreased strength;Decreased activity tolerance;Impaired balance (sitting and/or standing);Decreased knowledge of use of DME or AE;Decreased knowledge of precautions;Cardiopulmonary status limiting activity;Pain      OT Treatment/Interventions: Self-care/ADL training;Neuromuscular education;DME and/or AE instruction;Therapeutic activities;Patient/family education;Balance training    OT Goals(Current goals can be found in the care plan section) Acute Rehab OT Goals Patient Stated Goal: to go home OT Goal Formulation: With patient Time For Goal Achievement: 05/03/17 Potential to Achieve Goals: Good ADL Goals Pt Will Perform Grooming: with min guard assist;standing Pt Will Perform Upper Body Bathing: with supervision;sitting Pt Will Perform Lower Body Bathing: with min guard assist;sit to/from stand Pt Will Perform Upper Body Dressing: with set-up;sitting Pt Will Perform Lower Body Dressing: with min guard assist;sit to/from stand Pt Will Transfer to Toilet: with min guard assist;ambulating;regular height toilet;bedside  commode;grab bars Pt Will Perform Toileting - Clothing Manipulation and hygiene: with min guard assist;sit to/from stand Additional ADL Goal #1: Pt will be independent with managing HVAD equipment during ADLs  OT Frequency: Min 2X/week   Barriers to D/C:            Co-evaluation   Reason for Co-Treatment: Complexity of the patient's impairments (multi-system involvement);Necessary to address cognition/behavior during functional activity;For patient/therapist safety PT goals addressed during session: Mobility/safety with mobility;Balance        AM-PAC PT "6 Clicks" Daily Activity     Outcome Measure Help from another person eating meals?: Total Help from another person taking care of personal grooming?: A Lot Help from another person toileting, which includes using toliet, bedpan, or urinal?: Total Help from another person bathing (including washing, rinsing, drying)?: Total Help from another person to put on and taking off regular upper body clothing?: Total Help from another person to put on and taking off regular lower body clothing?: Total 6 Click Score: 7   End of Session Equipment Utilized During Treatment: Oxygen Nurse Communication: Mobility status (RN present )  Activity Tolerance: Patient tolerated treatment well Patient left: in bed;with call bell/phone within reach;with nursing/sitter in room  OT Visit Diagnosis: Unsteadiness on feet (R26.81);Muscle weakness (generalized) (M62.81)                Time: 9629-5284 OT Time Calculation (min): 47  min Charges:  OT General Charges $OT Visit: 1 Procedure OT Evaluation $OT Eval High Complexity: 1 Procedure OT Treatments $Therapeutic Activity: 8-22 mins G-Codes:     Reynolds AmericanWendi Samrat Hayward, OTR/L [phone]   [name], Byanca Kasper M 04/19/2017, 9:41 AM

## 2017-04-19 NOTE — Progress Notes (Signed)
Patient ID: James [name], male   DOB: [birth year], 56 y.o.   MRN: [id number]   Advanced Heart Failure VAD Team Note  Subjective:    Initially admitted and aggressively diuresed.    To OR for HVAD placement 6/28.  Returned to the OR that evening with high chest tube output, evacuation of mediastinal hematoma.   Extubated 6/29.   Feels ok today. Chest sore. Breathing ok.  Remains on milrinone 0.3, epi 3, NE 4. NO 3. Co-ox 73%  Creatinine up 1.6-> 1.8. Bili 5.0   Swan numbers: CVP 6 PA 40/11  HVAD INTERROGATION:  HVAD:  Flow 5.1 liters/min, speed 2700, power 4.2, peak 7.2/trough 2.2.  No alarms  Objective:    Vital Signs:   Temp:  [97.3 F (36.3 C)-99.1 F (37.3 C)] 98.1 F (36.7 C) (06/30 0800) Pulse Rate:  [88-96] 88 (06/30 0800) Resp:  [12-26] 21 (06/30 0800) BP: (83-121)/(63-91) 94/66 (06/30 0800) SpO2:  [95 %-100 %] 100 % (06/30 0800) Arterial Line BP: (73-127)/(57-80) 92/62 (06/30 0800) FiO2 (%):  [40 %] 40 % (06/29 1500) Weight:  [95.4 kg (210 lb 5.1 oz)] 95.4 kg (210 lb 5.1 oz) (06/30 0600) Last BM Date: 04/16/17 Mean arterial Pressure 70-80s  Intake/Output:   Intake/Output Summary (Last 24 hours) at 04/19/17 0918 Last data filed at 04/19/17 0800  Gross per 24 hour  Intake          3206.59 ml  Output             1655 ml  Net          1551.59 ml     Physical Exam    General:  Sitting up in bed NAD HEENT: normal  Neck: supple. RIJ swan  Carotids 2+ bilat; no bruits. No lymphadenopathy or thryomegaly appreciated. Cor: Sternal dressing ok LVAD hum. +rub Lungs: Rhonchi  Abdomen: Mildly distended. NT. Hypoactive BS   Extremities: warm. Mild LE edema. RLE ulcer + diffuse gouty changes Neuro: alert & oriented x 3. No focal deficits. Moves all 4 without problem     Telemetry   Personally reviewed.  A-paced, v-sensed 90. (underneath NSR with PACs 80s) Personally reviewed   Labs   Basic Metabolic Panel:  Recent Labs Lab 04/16/17 0517 03/30/2017 0451   03/30/2017 1638 04/14/2017 1653 03/31/2017 1943 03/24/2017 2033 04/19/2017 2204 04/01/2017 2220 04/18/17 0416 04/18/17 1640 04/19/17 0304  NA 133* 135  < > 137 137 136 137 137  --  135  --  135  K 5.0 4.6  < > 4.1 4.1 4.9 4.7 4.8  --  4.4  --  4.3  CL 95* 95*  < > 102 97* 98* 97*  --   --  103  --  101  CO2 29 30  --  28  --   --   --   --   --  25  --  24  GLUCOSE 97 115*  < > 78 76 115* 126* 136*  --  139*  --  119*  BUN 33* 39*  < > 35* 33* 31* 32*  --   --  32*  --  32*  CREATININE 1.84* 1.90*  < > 1.66* 1.60* 1.40* 1.30*  --  1.60* 1.65* 1.87* 1.87*  CALCIUM 8.1* 8.1*  --  7.7*  --   --   --   --   --  8.3*  --  8.5*  MG  --   --   --  1.5*  --   --   --   --  2.1 2.0 2.0 2.0  PHOS  --   --   --   --   --   --   --   --   --  3.2  --  4.8*  < > = values in this interval not displayed.  Liver Function Tests:  Recent Labs Lab 04/18/17 0416 04/19/17 0304  AST 38 39  ALT 14* 13*  ALKPHOS 66 80  BILITOT 4.5* 4.1*  PROT 4.7* 5.0*  ALBUMIN 2.9* 2.5*   No results for input(s): LIPASE, AMYLASE in the last 168 hours. No results for input(s): AMMONIA in the last 168 hours.  CBC:  Recent Labs Lab 04/09/2017 2220 04/18/17 0349 04/18/17 0416 04/18/17 1640 04/19/17 0304  WBC 14.9* 15.3* 15.7* 17.5* 18.9*  NEUTROABS  --   --  14.3*  --  16.5*  HGB 9.2* 8.9* 8.8* 8.6* 7.9*  HCT 27.7* 26.2* 26.2* 26.1* 24.0*  MCV 82.9 81.6 81.6 84.7 84.2  PLT 132* 140* 138* 148* 140*    INR:  Recent Labs Lab 03/22/2017 1638 04/18/17 0416 04/19/17 0304  INR 1.46 1.31 1.45    Other results:     Imaging   Dg Abd 1 View  Result Date: 04/18/2017 CLINICAL DATA:  Enteric tube repositioned EXAM: ABDOMEN - 1 VIEW COMPARISON:  04/14/2017, 04/12/2017. FINDINGS: The enteric tube extends below the diaphragm in curves up into the gastric fundus. Mediastinal and left pleural drains are again evident. Swan-Ganz catheter is again evident. Moderate elevation of the left hemidiaphragm, unchanged.  IMPRESSION: The enteric tube extends well into the stomach, with tip in the fundus under the elevated left hemidiaphragm. Electronically Signed   By: Andreas Newport M.D.   On: 04/18/2017 00:31   Dg Chest Port 1 View  Result Date: 04/18/2017 CLINICAL DATA:  Status post LVAD placement EXAM: PORTABLE CHEST 1 VIEW COMPARISON:  03/23/2017 FINDINGS: Endotracheal tube and nasogastric catheter are again seen and stable. Left ventricular assist device is again noted and stable. Defibrillator and Swan-Ganz catheter are noted in satisfactory position. Mediastinal drain and bilateral thoracostomy catheters are noted. No pneumothorax is seen. Lungs are well aerated bilaterally. Improved aeration in the left base is noted although persistent density is seen IMPRESSION: Improving aeration in the left base. Stable tubes and lines. Electronically Signed   By: Inez Catalina M.D.   On: 04/18/2017 07:31   Dg Chest Port 1 View  Result Date: 04/05/2017 CLINICAL DATA:  Postop left ventricular assist device EXAM: PORTABLE CHEST 1 VIEW COMPARISON:  04/01/2017 FINDINGS: Endotracheal tube tip is approximately 11 mm superior to the carina. Postsurgical changes of the mediastinum with drainage catheter present. Additional chest drainage catheter tip projects over the right lung apex. Left-sided chest drainage tube tip over the left upper lung. Right IJ Swan-Ganz catheter tip projects over central pulmonary confluence. Partially visualized LVAD. Left-sided pacing device with lead over the RV. There is cardiomegaly with dense left basilar consolidation probable left effusion. Aortic atherosclerosis. No definite pneumothorax is seen. Esophageal tube tip appears to curl of the base of the heart IMPRESSION: 1. Support lines and tubes as above. Uncertain position of the tip of the esophageal tube which appears curled upwards at the left lung base. Repositioning is suggested. 2. Dense left lung base consolidation with probable small left  effusion Electronically Signed   By: Donavan Foil M.D.   On: 03/21/2017 23:28   Dg Chest Port 1 View  Result Date: 04/08/2017 CLINICAL DATA:  The patient has undergone placement of the  left ventricular assist device. EXAM: PORTABLE CHEST 1 VIEW COMPARISON:  Chest x-ray of April 16, 2017 FINDINGS: The cardiac silhouette is mildly enlarged. The pulmonary vascularity is not engorged. There is no significant pleural effusion and no pneumothorax. The left ventricular assist device is only partially imaged at the inferior margin of the study. The Swan-Ganz catheter tip projects in the main pulmonary outflow tract. The esophagogastric tube tip projects below the inferior margin of the image but the proximal port is at or just above the GE junction. The endotracheal tube tip lies approximately 4 cm above the carina. The right-sided chest tube tip projects between the posterior right fifth and sixth ribs. The mediastinal drain is in good position projecting over the mid thoracic spine. The left basilar chest tube tip projects just above the medial aspect of the left hemidiaphragm. The left-sided chest tube tip projects over the posterior fifth and sixth rib interspace. The ICD is in stable position. IMPRESSION: No pulmonary edema. Mild left lower lobe atelectasis. No significant pleural effusion and no pneumothorax. The visualized portions of the left ventricular assist device appear appropriately positioned. The other support tubes and lines are in reasonable position. Advancement of the esophagogastric tube by 510 cm is needed to assure that the proximal port lies below the GE junction. Electronically Signed   By: David  Martinique M.D.   On: 04/05/2017 16:45   Dg Abd Portable 1v  Result Date: 04/19/2017 CLINICAL DATA:  OG tube placement EXAM: PORTABLE ABDOMEN - 1 VIEW COMPARISON:  04/12/2017, 04/07/2017 FINDINGS: Postsurgical changes of the mediastinum. Dense left lower lobe consolidation with small left pleural  effusion. Multiple mediastinal and chest drainage tubes with LVAD in place. Esophageal tube tip turns upward and projects over the left lung base, appearance could be due to elevated left diaphragm with tip in the proximal stomach IMPRESSION: 1. A esophageal tube tip projects cephalad over the left base, uncertain if this is related to tip being positioned in the proximal stomach with elevation of the left diaphragm as suggested on scout images from prior CT. 2. Small left pleural effusion with dense consolidation in the left lung base. Electronically Signed   By: Donavan Foil M.D.   On: 04/10/2017 23:32     Medications:     Scheduled Medications: . acetaminophen  1,000 mg Oral Q6H   Or  . acetaminophen (TYLENOL) oral liquid 160 mg/5 mL  1,000 mg Per Tube Q6H  . amiodarone  200 mg Oral BID  . aspirin EC  325 mg Oral Daily   Or  . aspirin  324 mg Per Tube Daily   Or  . aspirin  300 mg Rectal Daily  . atorvastatin  80 mg Oral q1800  . bisacodyl  10 mg Oral Daily   Or  . bisacodyl  10 mg Rectal Daily  . chlorhexidine gluconate (MEDLINE KIT)  15 mL Mouth Rinse BID  . Chlorhexidine Gluconate Cloth  6 each Topical Q0600  . colchicine  0.6 mg Oral Daily  . digoxin  0.0625 mg Oral Daily  . docusate sodium  200 mg Oral Daily  . febuxostat  120 mg Oral Daily  . insulin aspart  0-24 Units Subcutaneous Q4H  . insulin detemir  15 Units Subcutaneous Daily  . mouth rinse  15 mL Mouth Rinse 10 times per day  . metoCLOPramide (REGLAN) injection  10 mg Intravenous Q6H  . mupirocin ointment  1 application Nasal BID  . pantoprazole  40 mg Oral Daily  .  sodium chloride flush  10-40 mL Intracatheter Q12H  . sodium chloride flush  3 mL Intravenous Q12H    Infusions: . sodium chloride 20 mL/hr at 04/19/17 0800  . sodium chloride 250 mL (04/19/17 0800)  . sodium chloride 20 mL/hr at 04/19/17 0800  . sodium chloride    . sodium chloride    . dexmedetomidine Stopped (04/18/17 1200)  . EPINEPHrine 4  mg in dextrose 5% 250 mL infusion (16 mcg/mL) 3.013 mcg/min (04/19/17 0800)  . ferumoxytol Stopped (04/14/17 1236)  . lactated ringers    . lactated ringers    . lactated ringers 20 mL/hr at 04/19/17 0800  . magnesium sulfate    . milrinone 0.3 mcg/kg/min (04/19/17 0800)  . norepinephrine (LEVOPHED) Adult infusion 4 mcg/min (04/19/17 0815)  . vancomycin Stopped (04/18/17 2151)    PRN Medications: sodium chloride, lactated ringers, midazolam, morphine injection, ondansetron (ZOFRAN) IV, oxyCODONE, sodium chloride flush, sodium chloride flush, traMADol   Patient Profile   56 yo with CAD s/p CABG, ischemic cardiomyopathy/chronic systolic CHF, tophaceous gout, and CKD stage 3 was admitted for diuresis and consideration for LVAD placement.   Assessment/Plan:    1. Acute/chronic systolic CHF: Ischemic cardiomyopathy.  St Jude ICD.  Echo (6/18) with EF 15%, mildly dilated RV with moderately decreased systolic function.  RHC 6/22 with elevated filling pressures and evidence of RV failure though PAPi score 1.95 so not prohibitive.  He has been seen at [facility] and turned down for transplant with severe tophaceous gout and concern for worsening on transplant meds.  He was diuresed extensively, repeat RHC 6/26 with marked improvement in filling pressures and stable cardiac output on milrinone.  HVAD placement 6/29 and had to return to OR to evacuate mediastinal hematoma.   -Extubated 6/29 - Hemodynamics stable this am on epinephrine 3, norepinephrine 4, and milrinone 0.3. Co-ox 73% - CVP 6 this morning but weight up 25 pounds. Will try to diurese slowly with lasix gtt at 4 - Speed turned up to 2760. Suction alarm turned on (d/w VAD coordinator and Dr. Cyndia Bent). Can turn back down a \s needed - Wean pressors and NO as able, continue milrinone.  - HVAD parameters look ok. Speed adjusted as above - On ASA, starting warfarin today.  2. AKI on CKD stage 3:  -Creatinine up slightly today. Will follow 3.  Anemia:  - Hgb down slightly at 7.9 Seen by GI, they do not feel that he needs endoscopy.  FOBT negative.  - Will likely need another unit RBCs. CT output minimal 4. CAD:  -s/p CABG 2012.  Stable, no chest pain.  5. Gout:  - Severe tophaceous gout.  On Uloric, will continue colchicine daily.    6. Arrhythmias:  -PVCs, short runs AT noted on milrinone initially, on po amiodarone. Will continue 7. Malnutrition:  - Encourage po intake Can give albumin as needed 8. Leukocytosis --WBC climbing. On abx. No localizing signs infection  I reviewed the LVAD parameters from today, and compared the results to the patient's prior recorded data.  No programming changes were made.  The LVAD is functioning within specified parameters.  The patient performs LVAD self-test daily.  LVAD interrogation was negative for any significant power changes, alarms or PI events/speed drops.  LVAD equipment check completed and is in good working order.  Back-up equipment present.   LVAD education done on emergency procedures and precautions and reviewed exit site care.   CRITICAL CARE Performed by: Glori Bickers  Total critical care time: 19  minutes  Critical care time was exclusive of separately billable procedures and treating other patients.  Critical care was necessary to treat or prevent imminent or life-threatening deterioration.  Critical care was time spent personally by me (independent of midlevel providers or residents) on the following activities: development of treatment plan with patient and/or surrogate as well as nursing, discussions with consultants, evaluation of patient's response to treatment, examination of patient, obtaining history from patient or surrogate, ordering and performing treatments and interventions, ordering and review of laboratory studies, ordering and review of radiographic studies, pulse oximetry and re-evaluation of patient's condition.    Glori Bickers, MD 04/19/2017,  9:18 AM  VAD Team --- VAD ISSUES ONLY--- Pager [phone] (7am - 7am)  Advanced Heart Failure Team  Pager [phone] (M-F; 7a - 4p)  Please contact [facility] for night-coverage after hours (4p -7a ) and weekends on [email or web address]

## 2017-04-19 NOTE — Progress Notes (Signed)
Per MD, wean nitric down to zero and cut off.  Cut nitric off and patient is currently tolerating well.  Will continue to monitor.

## 2017-04-19 NOTE — Plan of Care (Signed)
Problem: Activity: Goal: Risk for activity intolerance will decrease Outcome: Progressing Pt tolerated dangling at bedside post extubation with maximum support d/t pre-op deconditioning. Physical therapy consult already in place.  Problem: Respiratory: Goal: Ability to maintain adequate ventilation will improve Outcome: Progressing Pt tolerating extubation s/p HVAD placement. Delayed in extubation d/t post-op bleeding. Per MD order Pt weaned to extubate on POD1 to 3 L [place], able to demonstrate pulmonary toilet exercise.

## 2017-04-20 ENCOUNTER — Inpatient Hospital Stay (HOSPITAL_COMMUNITY): Payer: Medicare PPO

## 2017-04-20 LAB — TYPE AND SCREEN
ABO/RH(D): A POS
ANTIBODY SCREEN: NEGATIVE
UNIT DIVISION: 0
UNIT DIVISION: 0
UNIT DIVISION: 0
Unit division: 0
Unit division: 0
Unit division: 0
Unit division: 0
Unit division: 0
Unit division: 0
Unit division: 0

## 2017-04-20 LAB — GLUCOSE, CAPILLARY
GLUCOSE-CAPILLARY: 100 mg/dL — AB (ref 65–99)
GLUCOSE-CAPILLARY: 104 mg/dL — AB (ref 65–99)
GLUCOSE-CAPILLARY: 78 mg/dL (ref 65–99)
Glucose-Capillary: 71 mg/dL (ref 65–99)
Glucose-Capillary: 85 mg/dL (ref 65–99)
Glucose-Capillary: 111 mg/dL — ABNORMAL HIGH (ref 65–99)

## 2017-04-20 LAB — COMPREHENSIVE METABOLIC PANEL
ALT: 11 U/L — ABNORMAL LOW (ref 17–63)
ANION GAP: 7 (ref 5–15)
AST: 27 U/L (ref 15–41)
Albumin: 2.2 g/dL — ABNORMAL LOW (ref 3.5–5.0)
Alkaline Phosphatase: 82 U/L (ref 38–126)
BUN: 38 mg/dL — ABNORMAL HIGH (ref 6–20)
CALCIUM: 8 mg/dL — AB (ref 8.9–10.3)
CHLORIDE: 100 mmol/L — AB (ref 101–111)
CO2: 26 mmol/L (ref 22–32)
Creatinine, Ser: 2.1 mg/dL — ABNORMAL HIGH (ref 0.61–1.24)
GFR, EST AFRICAN AMERICAN: 39 mL/min — AB (ref >60)
GFR, EST NON AFRICAN AMERICAN: 34 mL/min — AB (ref >60)
Glucose, Bld: 73 mg/dL (ref 65–99)
Potassium: 4.1 mmol/L (ref 3.5–5.1)
SODIUM: 133 mmol/L — AB (ref 135–145)
Total Bilirubin: 2.5 mg/dL — ABNORMAL HIGH (ref 0.3–1.2)
Total Protein: 4.8 g/dL — ABNORMAL LOW (ref 6.5–8.1)

## 2017-04-20 LAB — POCT I-STAT 7, (LYTES, BLD GAS, ICA,H+H)
ACID-BASE EXCESS: 7 mmol/L — AB (ref 0.0–2.0)
BICARBONATE: 32.3 mmol/L — AB (ref 20.0–28.0)
CALCIUM ION: 1.09 mmol/L — AB (ref 1.15–1.40)
HEMATOCRIT: 26 % — AB (ref 39.0–52.0)
HEMOGLOBIN: 8.8 g/dL — AB (ref 13.0–17.0)
O2 Saturation: 100 %
PH ART: 7.396 (ref 7.350–7.450)
POTASSIUM: 4.1 mmol/L (ref 3.5–5.1)
SODIUM: 140 mmol/L (ref 135–145)
TCO2: 34 mmol/L (ref 0–100)
pCO2 arterial: 52.6 mmHg — ABNORMAL HIGH (ref 32.0–48.0)
pO2, Arterial: 478 mmHg — ABNORMAL HIGH (ref 83.0–108.0)

## 2017-04-20 LAB — BPAM RBC
BLOOD PRODUCT EXPIRATION DATE: 201807082359
BLOOD PRODUCT EXPIRATION DATE: 201807092359
BLOOD PRODUCT EXPIRATION DATE: 201807102359
BLOOD PRODUCT EXPIRATION DATE: 201807102359
Blood Product Expiration Date: 201807092359
Blood Product Expiration Date: 201807102359
Blood Product Expiration Date: 201807102359
Blood Product Expiration Date: 201807102359
Blood Product Expiration Date: 201807102359
Blood Product Expiration Date: 201807102359
ISSUE DATE / TIME: 201806280708
ISSUE DATE / TIME: 201806280708
ISSUE DATE / TIME: 201806281725
ISSUE DATE / TIME: 201806281725
ISSUE DATE / TIME: 201806281846
ISSUE DATE / TIME: 201806281846
ISSUE DATE / TIME: 201806281933
ISSUE DATE / TIME: 201806281933
UNIT TYPE AND RH: 6200
UNIT TYPE AND RH: 6200
UNIT TYPE AND RH: 6200
UNIT TYPE AND RH: 6200
Unit Type and Rh: 6200
Unit Type and Rh: 6200
Unit Type and Rh: 6200
Unit Type and Rh: 6200
Unit Type and Rh: 6200
Unit Type and Rh: 6200

## 2017-04-20 LAB — PROTIME-INR
INR: 1.44
PROTHROMBIN TIME: 17.7 s — AB (ref 11.4–15.2)

## 2017-04-20 LAB — MAGNESIUM: MAGNESIUM: 2.1 mg/dL (ref 1.7–2.4)

## 2017-04-20 LAB — POCT I-STAT 3, ART BLOOD GAS (G3+)
Acid-Base Excess: 1 mmol/L (ref 0.0–2.0)
Bicarbonate: 26.2 mmol/L (ref 20.0–28.0)
O2 Saturation: 91 %
PCO2 ART: 39.9 mmHg (ref 32.0–48.0)
PH ART: 7.422 (ref 7.350–7.450)
TCO2: 27 mmol/L (ref 0–100)
pO2, Arterial: 57 mmHg — ABNORMAL LOW (ref 83.0–108.0)

## 2017-04-20 LAB — CBC WITH DIFFERENTIAL/PLATELET
BASOS PCT: 0 %
Basophils Absolute: 0 10*3/uL (ref 0.0–0.1)
EOS ABS: 0.1 10*3/uL (ref 0.0–0.7)
Eosinophils Relative: 1 %
HEMATOCRIT: 22 % — AB (ref 39.0–52.0)
HEMOGLOBIN: 7.2 g/dL — AB (ref 13.0–17.0)
LYMPHS ABS: 0.7 10*3/uL (ref 0.7–4.0)
Lymphocytes Relative: 5 %
MCH: 28 pg (ref 26.0–34.0)
MCHC: 32.7 g/dL (ref 30.0–36.0)
MCV: 85.6 fL (ref 78.0–100.0)
MONOS PCT: 11 %
Monocytes Absolute: 1.6 10*3/uL — ABNORMAL HIGH (ref 0.1–1.0)
NEUTROS ABS: 12.2 10*3/uL — AB (ref 1.7–7.7)
NEUTROS PCT: 83 %
Platelets: 139 10*3/uL — ABNORMAL LOW (ref 150–400)
RBC: 2.57 MIL/uL — AB (ref 4.22–5.81)
RDW: 19.1 % — ABNORMAL HIGH (ref 11.5–15.5)
WBC: 14.6 10*3/uL — AB (ref 4.0–10.5)

## 2017-04-20 LAB — COOXEMETRY PANEL
Carboxyhemoglobin: 1.7 % — ABNORMAL HIGH (ref 0.5–1.5)
METHEMOGLOBIN: 1.6 % — AB (ref 0.0–1.5)
O2 Saturation: 77.6 %
TOTAL HEMOGLOBIN: 5.2 g/dL — AB (ref 12.0–16.0)

## 2017-04-20 LAB — PHOSPHORUS: PHOSPHORUS: 4.5 mg/dL (ref 2.5–4.6)

## 2017-04-20 LAB — LACTATE DEHYDROGENASE: LDH: 191 U/L (ref 98–192)

## 2017-04-20 LAB — PREPARE RBC (CROSSMATCH)

## 2017-04-20 MED ORDER — WARFARIN SODIUM 2.5 MG PO TABS
2.5000 mg | ORAL_TABLET | Freq: Once | ORAL | Status: AC
  Administered 2017-04-20: 2.5 mg via ORAL
  Filled 2017-04-20: qty 1

## 2017-04-20 MED ORDER — SODIUM CHLORIDE 0.9 % IV SOLN: Freq: Once | INTRAVENOUS | Status: AC

## 2017-04-20 MED ORDER — SIMETHICONE 80 MG PO CHEW
80.0000 mg | CHEWABLE_TABLET | Freq: Four times a day (QID) | ORAL | Status: DC | PRN
  Administered 2017-04-20: 80 mg via ORAL
  Filled 2017-04-20: qty 1

## 2017-04-20 MED ORDER — HYDRALAZINE HCL 20 MG/ML IJ SOLN
10.0000 mg | INTRAMUSCULAR | Status: DC | PRN
  Administered 2017-04-20 – 2017-06-17 (×32): 10 mg via INTRAVENOUS
  Filled 2017-04-20 (×34): qty 1

## 2017-04-20 MED ORDER — FUROSEMIDE 10 MG/ML IJ SOLN
80.0000 mg | Freq: Two times a day (BID) | INTRAMUSCULAR | Status: DC
  Administered 2017-04-20: 80 mg via INTRAVENOUS
  Filled 2017-04-20 (×2): qty 8

## 2017-04-20 NOTE — Plan of Care (Signed)
Problem: Activity: Goal: Risk for activity intolerance will decrease Outcome: Progressing Pt progressing to OOB with assistance, tolerating longer periods of time.  Problem: Cardiac: Goal: Ability to maintain an adequate cardiac output will improve Outcome: Progressing HVAD numbers stable with increases in speed, maintaining MAPs 65-80. Pt remains epicardially paced AAI at 90, 13 mA. Pt internal St Jude's PPM to be re-started per MD orders.

## 2017-04-20 NOTE — Plan of Care (Signed)
Problem: Fluid Volume: Goal: Risk for excess fluid volume will decrease Outcome: Progressing Hemodynamically stable with addition of Lasix drip with increasing urine output.

## 2017-04-20 NOTE — Progress Notes (Signed)
Patient ID: James [name], male   DOB: 06-24-1961, 56 y.o.   MRN: [id number]   Advanced Heart Failure VAD Team Note  Subjective:    Initially admitted and aggressively diuresed.    To OR for HVAD placement 6/28.  Returned to the OR that evening with high chest tube output, evacuation of mediastinal hematoma.   Extubated 6/29.   Feels ok today. Chest sore. Breathing ok.  LVAD speed increased to 2760 yesterday. Off NE and NO. Remains on milrinone 0.3, epi 1.  Co-ox 78%  Creatinine up 1.6-> 1.8 -> 2.1 Bili 5.0 -> 4.8  On lasix gtt at 4. Weight down 3 pounds (207). Pre-op weight 182-185.  Hgb 7.2. Getting 1uRBCs  Sitting in chair. Feels fine. Hungry.   CVP 9  HVAD INTERROGATION:  HVAD:  Flow 5.0 liters/min, speed 2760, power 4.5, peak 8.2/trough 2.5.  No alarms  Objective:    Vital Signs:   Temp:  [97.5 F (36.4 C)-98.8 F (37.1 C)] 98 F (36.7 C) (07/01 0805) Pulse Rate:  [87-91] 88 (07/01 0800) Resp:  [14-24] 14 (07/01 0800) BP: (79-117)/(46-85) 91/79 (07/01 0600) SpO2:  [97 %-100 %] 97 % (07/01 0800) Arterial Line BP: (60-148)/(48-89) 91/74 (07/01 0800) Weight:  [94.3 kg (207 lb 14.3 oz)] 94.3 kg (207 lb 14.3 oz) (07/01 0600) Last BM Date: 04/16/17 Mean arterial Pressure 90  Intake/Output:   Intake/Output Summary (Last 24 hours) at 04/20/17 0938 Last data filed at 04/20/17 0900  Gross per 24 hour  Intake           1399.6 ml  Output             1305 ml  Net             94.6 ml     Physical Exam    General:  Sitting in chair HEENT: normal anicteric Neck: supple. RIJ cordis (swan out)  Carotids 2+ bilat; no bruits. No lymphadenopathy or thryomegaly appreciated. Cor: Sternal dressing ok LVAD hum. +soft rub Lungs: clear  Abdomen: soft NT/ND good BS Extremities: warm. Tr-1+ edema . RLE ulcer with dressing  + diffuse severe gouty changes Neuro: alert & oriented x 3, cranial nerves grossly intact. moves all 4 extremities w/o difficulty. Affect pleasant    Telemetry    Personally reviewed. NSR 80s (pacer off) Personally reviewed    Labs   Basic Metabolic Panel:  Recent Labs Lab 04/06/2017 0451  04/14/2017 1638  03/29/2017 1943 04/18/2017 2033 04/11/2017 2204 03/28/2017 2220 04/18/17 0416 04/18/17 1640 04/19/17 0304 04/20/17 0323  NA 135  < > 137  < > 136 137 137  --  135  --  135 133*  K 4.6  < > 4.1  < > 4.9 4.7 4.8  --  4.4  --  4.3 4.1  CL 95*  < > 102  < > 98* 97*  --   --  103  --  101 100*  CO2 30  --  28  --   --   --   --   --  25  --  24 26  GLUCOSE 115*  < > 78  < > 115* 126* 136*  --  139*  --  119* 73  BUN 39*  < > 35*  < > 31* 32*  --   --  32*  --  32* 38*  CREATININE 1.90*  < > 1.66*  < > 1.40* 1.30*  --  1.60* 1.65* 1.87* 1.87* 2.10*  CALCIUM  8.1*  --  7.7*  --   --   --   --   --  8.3*  --  8.5* 8.0*  MG  --   < > 1.5*  --   --   --   --  2.1 2.0 2.0 2.0 2.1  PHOS  --   --   --   --   --   --   --   --  3.2  --  4.8* 4.5  < > = values in this interval not displayed.  Liver Function Tests:  Recent Labs Lab 04/18/17 0416 04/19/17 0304 04/20/17 0323  AST 38 39 27  ALT 14* 13* 11*  ALKPHOS 66 80 82  BILITOT 4.5* 4.1* 2.5*  PROT 4.7* 5.0* 4.8*  ALBUMIN 2.9* 2.5* 2.2*   No results for input(s): LIPASE, AMYLASE in the last 168 hours. No results for input(s): AMMONIA in the last 168 hours.  CBC:  Recent Labs Lab 04/18/17 0349 04/18/17 0416 04/18/17 1640 04/19/17 0304 04/20/17 0323  WBC 15.3* 15.7* 17.5* 18.9* 14.6*  NEUTROABS  --  14.3*  --  16.5* 12.2*  HGB 8.9* 8.8* 8.6* 7.9* 7.2*  HCT 26.2* 26.2* 26.1* 24.0* 22.0*  MCV 81.6 81.6 84.7 84.2 85.6  PLT 140* 138* 148* 140* 139*    INR:  Recent Labs Lab 03/29/2017 1638 04/18/17 0416 04/19/17 0304 04/20/17 0323  INR 1.46 1.31 1.45 1.44    Other results:     Imaging   Dg Chest Port 1 View  Result Date: 04/19/2017 CLINICAL DATA:  Chest tube, LEFT ventricular assist device EXAM: PORTABLE CHEST 1 VIEW COMPARISON:  Portable exam 0532 hours compared to  04/18/2017 FINDINGS: Interval removal of endotracheal and nasogastric tubes. LEFT ventricular assist device projects at cardiac apex. RIGHT jugular Swan-Ganz catheter with tip projecting over proximal RIGHT pulmonary artery. Mediastinal drain and BILATERAL thoracostomy tubes again seen. LEFT subclavian transvenous pacemaker/ AICD lead with tip projecting over RIGHT ventricle. Enlargement of cardiac silhouette with pulmonary vascular congestion. Perihilar edema slightly increased. Associated bibasilar atelectasis greater in LEFT lower lobe. Question small RIGHT pleural effusion. No pneumothorax. Bones demineralized. IMPRESSION: Postoperative changes as above with slightly increased pulmonary edema and bibasilar atelectasis. Electronically Signed   By: Ulyses SouthwardMark  Boles M.D.   On: 04/19/2017 09:20     Medications:     Scheduled Medications: . acetaminophen  1,000 mg Oral Q6H   Or  . acetaminophen (TYLENOL) oral liquid 160 mg/5 mL  1,000 mg Per Tube Q6H  . amiodarone  200 mg Oral BID  . aspirin EC  325 mg Oral Daily   Or  . aspirin  324 mg Per Tube Daily   Or  . aspirin  300 mg Rectal Daily  . atorvastatin  80 mg Oral q1800  . bisacodyl  10 mg Oral Daily   Or  . bisacodyl  10 mg Rectal Daily  . Chlorhexidine Gluconate Cloth  6 each Topical Q0600  . colchicine  0.6 mg Oral Daily  . digoxin  0.0625 mg Oral Daily  . docusate sodium  200 mg Oral Daily  . febuxostat  120 mg Oral Daily  . guaiFENesin  600 mg Oral BID  . insulin aspart  0-24 Units Subcutaneous Q4H  . insulin detemir  15 Units Subcutaneous Daily  . mouth rinse  15 mL Mouth Rinse BID  . metoCLOPramide (REGLAN) injection  10 mg Intravenous Q6H  . mupirocin ointment  1 application Nasal BID  . pantoprazole  40  mg Oral Daily  . sodium chloride flush  10-40 mL Intracatheter Q12H  . warfarin  2.5 mg Oral ONCE-1800  . Warfarin - Physician Dosing Inpatient   Does not apply q1800    Infusions: . sodium chloride Stopped (04/19/17 1200)    . sodium chloride Stopped (04/19/17 1200)  . sodium chloride Stopped (04/19/17 1200)  . sodium chloride    . sodium chloride    . EPINEPHrine 4 mg in dextrose 5% 250 mL infusion (16 mcg/mL) 2 mcg/min (04/20/17 0815)  . ferumoxytol Stopped (04/14/17 1236)  . furosemide (LASIX) infusion 4 mg/hr (04/20/17 0800)  . lactated ringers    . lactated ringers 20 mL/hr at 04/20/17 0800  . magnesium sulfate    . milrinone 0.3 mcg/kg/min (04/20/17 0800)  . norepinephrine (LEVOPHED) Adult infusion Stopped (04/20/17 0200)    PRN Medications: sodium chloride, morphine injection, neomycin-bacitracin-polymyxin, ondansetron (ZOFRAN) IV, oxyCODONE, sodium chloride flush, traMADol   Patient Profile   56 yo with CAD s/p CABG, ischemic cardiomyopathy/chronic systolic CHF, tophaceous gout, and CKD stage 3 was admitted for diuresis and consideration for LVAD placement. S/p HVAD on 6/28  Assessment/Plan:    1. Acute/chronic systolic CHF: Ischemic cardiomyopathy.  [employer] ICD.  Echo (6/18) with EF 15%, mildly dilated RV with moderately decreased systolic function.  RHC 6/22 with elevated filling pressures and evidence of RV failure though PAPi score 1.95 so not prohibitive.  He has been seen at [facility] and turned down for transplant with severe tophaceous gout and concern for worsening on transplant meds.  He was diuresed extensively, repeat RHC 6/26 with marked improvement in filling pressures and stable cardiac output on milrinone.  HVAD placement 6/28 and had to return to OR to evacuate mediastinal hematoma.   - POD #3 - Off NE/NO. Will stop epi this am. Continue milrinone. Co-ox 78% - CVP 9. Doesn't appear grossly volume overloaded but weight still up 15-20 pounds. Not responding to low dose lasix gtt (@ 4). Creatinine up slightly.Will change to IV lasix 80 IV bid and assess response. Will give first bolus after RBCs.  - Peak and trough with wide gap. I turned speed up again to 2820 Suction alarm turned on (d/w  VAD coordinator and Dr. Laneta Simmers). Can turn back down a s needed - Hopefully can start milrinone wean in am. - HVAD parameters reviewed personally. Speed adjusted as above - On ASA, starting warfarin today.  2. AKI on CKD stage 3:  -Creatinine up slightly again today. Suspect post-op ATN. Will follow.  3. Anemia:  - Hgb down to 7.2. Will transfuse this am. Seen by GI, they do not feel that he needs endoscopy.  FOBT negative.  - CT output minimal 4. CAD:  -s/p CABG 2012.  Stable, no s/s ischemia  5. Gout:  - Severe tophaceous gout. Controlled today.  On Uloric, will continue colchicine daily.    6. Arrhythmias:  -In NSR on po amiodarone. Will continue. Hopefully cans top soon.  7. Malnutrition:  - Encourage po intake 8. Leukocytosis --WBC falling. On abx. No localizing signs infection  I reviewed the LVAD parameters from today, and compared the results to the patient's prior recorded data.  No programming changes were made.  The LVAD is functioning within specified parameters.  The patient performs LVAD self-test daily.  LVAD interrogation was negative for any significant power changes, alarms or PI events/speed drops.  LVAD equipment check completed and is in good working order.  Back-up equipment present.   LVAD education  done on emergency procedures and precautions and reviewed exit site care.    Arvilla Meres, MD 04/20/2017, 9:38 AM  VAD Team --- VAD ISSUES ONLY--- Pager [phone] (7am - 7am)  Advanced Heart Failure Team  Pager [phone] (M-F; 7a - 4p)  Please contact [facility] for night-coverage after hours (4p -7a ) and weekends on [email or web address]

## 2017-04-20 NOTE — Progress Notes (Signed)
Patient ID: James [name], male   DOB: [birth year], 56 y.o.   MRN: [id number]  HVAD Rounding Note  Subjective:    No complaints. Up in chair. Passing some flatus, no BM. Taking some clear liquids.  No shortness of breath. Pain well-controlled.  Co-ox 78%. Levophed off overnight. Epi down to 2 mcg, milrinone 0.3  CVP 8  LVAD INTERROGATION:  HVAD:  Flow 5 liters/min, speed 2760, power 4 watts  Objective:    Vital Signs:   Temp:  [97.5 F (36.4 C)-98.8 F (37.1 C)] 98 F (36.7 C) (07/01 0805) Pulse Rate:  [87-91] 88 (07/01 0800) Resp:  [14-24] 14 (07/01 0800) BP: (79-117)/(46-85) 91/79 (07/01 0600) SpO2:  [97 %-100 %] 97 % (07/01 0800) Arterial Line BP: (60-148)/(48-89) 91/74 (07/01 0800) Weight:  [94.3 kg (207 lb 14.3 oz)] 94.3 kg (207 lb 14.3 oz) (07/01 0600) Last BM Date: 04/16/17 Mean arterial Pressure 85  Intake/Output:   Intake/Output Summary (Last 24 hours) at 04/20/17 0855 Last data filed at 04/20/17 0800  Gross per 24 hour  Intake          1496.38 ml  Output             1210 ml  Net           286.38 ml     Physical Exam: General:  Well appearing. No resp difficulty HEENT: normal Cor: normal heart sounds with LVAD hum present. Lungs: decreased in lower lobes Abdomen: soft, nontender, nondistended. Few bowel sounds. Extremities: no cyanosis, clubbing, rash, edema Neuro: alert & orientedx3, cranial nerves grossly intact. moves all 4 extremities w/o difficulty. Affect pleasant  Telemetry: sinus 84  Labs: Basic Metabolic Panel:  Recent Labs Lab 04-25-17 0451  04/25/2017 1638  04-25-17 1943 2017-04-25 2033 25-Apr-2017 2204 04-25-17 2220 04/18/17 0416 04/18/17 1640 04/19/17 0304 04/20/17 0323  NA 135  < > 137  < > 136 137 137  --  135  --  135 133*  K 4.6  < > 4.1  < > 4.9 4.7 4.8  --  4.4  --  4.3 4.1  CL 95*  < > 102  < > 98* 97*  --   --  103  --  101 100*  CO2 30  --  28  --   --   --   --   --  25  --  24 26  GLUCOSE 115*  < > 78  < > 115* 126* 136*   --  139*  --  119* 73  BUN 39*  < > 35*  < > 31* 32*  --   --  32*  --  32* 38*  CREATININE 1.90*  < > 1.66*  < > 1.40* 1.30*  --  1.60* 1.65* 1.87* 1.87* 2.10*  CALCIUM 8.1*  --  7.7*  --   --   --   --   --  8.3*  --  8.5* 8.0*  MG  --   < > 1.5*  --   --   --   --  2.1 2.0 2.0 2.0 2.1  PHOS  --   --   --   --   --   --   --   --  3.2  --  4.8* 4.5  < > = values in this interval not displayed.  Liver Function Tests:  Recent Labs Lab 04/18/17 0416 04/19/17 0304 04/20/17 0323  AST 38 39 27  ALT 14* 13* 11*  ALKPHOS 66 80 82  BILITOT 4.5* 4.1* 2.5*  PROT 4.7* 5.0* 4.8*  ALBUMIN 2.9* 2.5* 2.2*   No results for input(s): LIPASE, AMYLASE in the last 168 hours. No results for input(s): AMMONIA in the last 168 hours.  CBC:  Recent Labs Lab 04/18/17 0349 04/18/17 0416 04/18/17 1640 04/19/17 0304 04/20/17 0323  WBC 15.3* 15.7* 17.5* 18.9* 14.6*  NEUTROABS  --  14.3*  --  16.5* 12.2*  HGB 8.9* 8.8* 8.6* 7.9* 7.2*  HCT 26.2* 26.2* 26.1* 24.0* 22.0*  MCV 81.6 81.6 84.7 84.2 85.6  PLT 140* 138* 148* 140* 139*    INR:  Recent Labs Lab 04/04/2017 1638 04/18/17 0416 04/19/17 0304 04/20/17 0323  INR 1.46 1.31 1.45 1.44   LDH 191  Other results:  EKG:   Imaging: Dg Chest Port 1 View  Result Date: 04/19/2017 CLINICAL DATA:  Chest tube, LEFT ventricular assist device EXAM: PORTABLE CHEST 1 VIEW COMPARISON:  Portable exam 0532 hours compared to 04/18/2017 FINDINGS: Interval removal of endotracheal and nasogastric tubes. LEFT ventricular assist device projects at cardiac apex. RIGHT jugular Swan-Ganz catheter with tip projecting over proximal RIGHT pulmonary artery. Mediastinal drain and BILATERAL thoracostomy tubes again seen. LEFT subclavian transvenous pacemaker/ AICD lead with tip projecting over RIGHT ventricle. Enlargement of cardiac silhouette with pulmonary vascular congestion. Perihilar edema slightly increased. Associated bibasilar atelectasis greater in LEFT lower  lobe. Question small RIGHT pleural effusion. No pneumothorax. Bones demineralized. IMPRESSION: Postoperative changes as above with slightly increased pulmonary edema and bibasilar atelectasis. Electronically Signed   By: Ulyses Southward M.D.   On: 04/19/2017 09:20      Medications:     Scheduled Medications: . acetaminophen  1,000 mg Oral Q6H   Or  . acetaminophen (TYLENOL) oral liquid 160 mg/5 mL  1,000 mg Per Tube Q6H  . amiodarone  200 mg Oral BID  . aspirin EC  325 mg Oral Daily   Or  . aspirin  324 mg Per Tube Daily   Or  . aspirin  300 mg Rectal Daily  . atorvastatin  80 mg Oral q1800  . bisacodyl  10 mg Oral Daily   Or  . bisacodyl  10 mg Rectal Daily  . Chlorhexidine Gluconate Cloth  6 each Topical Q0600  . colchicine  0.6 mg Oral Daily  . digoxin  0.0625 mg Oral Daily  . docusate sodium  200 mg Oral Daily  . febuxostat  120 mg Oral Daily  . guaiFENesin  600 mg Oral BID  . insulin aspart  0-24 Units Subcutaneous Q4H  . insulin detemir  15 Units Subcutaneous Daily  . mouth rinse  15 mL Mouth Rinse BID  . metoCLOPramide (REGLAN) injection  10 mg Intravenous Q6H  . mupirocin ointment  1 application Nasal BID  . pantoprazole  40 mg Oral Daily  . sodium chloride flush  10-40 mL Intracatheter Q12H  . warfarin  2.5 mg Oral ONCE-1800  . Warfarin - Physician Dosing Inpatient   Does not apply q1800     Infusions: . sodium chloride Stopped (04/19/17 1200)  . sodium chloride Stopped (04/19/17 1200)  . sodium chloride Stopped (04/19/17 1200)  . sodium chloride    . sodium chloride    . sodium chloride    . EPINEPHrine 4 mg in dextrose 5% 250 mL infusion (16 mcg/mL) 2 mcg/min (04/20/17 0815)  . ferumoxytol Stopped (04/14/17 1236)  . furosemide (LASIX) infusion 4 mg/hr (04/20/17 0800)  . lactated ringers    . lactated  ringers 20 mL/hr at 04/20/17 0800  . magnesium sulfate    . milrinone 0.3 mcg/kg/min (04/20/17 0800)  . norepinephrine (LEVOPHED) Adult infusion Stopped  (04/20/17 0200)     PRN Medications:  sodium chloride, morphine injection, neomycin-bacitracin-polymyxin, ondansetron (ZOFRAN) IV, oxyCODONE, sodium chloride flush, traMADol   Assessment/Plan/Discussion:    POD 3 s/p HVAD for ischemic cardiomyopathy with acute on chronic systolic heart failure, EF 15% with moderate RV dysfunction. Returned to OR for bleeding from raw mediastinal tissues. Hemodynamics look good this morning and flow 5L.  Arterial tracing is pulsatile. Could increase pump speed a little further. Will discuss with DB. Wean epi as tolerated. Continue Milrinone.  PICC line.  Coumadin today 2.5 mg  Stage 3 CKD: creat slightly higher today to 2.1. Probably ATN from surgery. Continue to follow and should improve. Wt down 2 lbs but still about 22 lbs over preop.  Started on lasix drip today at 4 mg per hr but still only 20 cc/hr. Probably needs higher dose or some metolazone.  DM: preop Hgb A1c 5.9. Glucose under good control. Continue Levemir and SSI.  Chronic anemia felt to be due to chronic disease.  Hgb down further today probably due to equilibration and volume excess. Will transfuse 1 unit this am.   Gout: continue previous meds.   Hx of arrhythmias preop: continue po amio.  ICD in place but will wait to turn on until next week.  Malnutrition: encourage po nutrition. He has pressure sore on right leg and buttock.  Remove 2 MT's today. Keep pump drain in today.  DC arterial line.  OOB to chair.  I reviewed the LVAD parameters from today, and compared the results to the patient's prior recorded data. LVAD equipment check completed andis in good working order. Back-up equipment present.     Length of Stay: [id number]  Payton DoughtyBryan K Unity Medical [facility] Surgical HospitalBartle 04/20/2017, 8:55 AM

## 2017-04-20 NOTE — Progress Notes (Signed)
  Patient's MAP and peak/trough dropped with IV lasix. CVP 5.   Given 250 NS. Placed on low-dose epi and speed turned down with resolution.  Arvilla MeresBensimhon, Daniel, MD  10:08 PM

## 2017-04-20 NOTE — Progress Notes (Signed)
Pt MAP consistently > 85. Dr Laneta SimmersBartle called and informed of reads. Order received for PRN hydralazine. Will continue to monitor and assess.

## 2017-04-20 DEATH — deceased

## 2017-04-21 ENCOUNTER — Inpatient Hospital Stay (HOSPITAL_COMMUNITY): Payer: Medicare PPO

## 2017-04-21 LAB — CBC WITH DIFFERENTIAL/PLATELET
BASOS ABS: 0 10*3/uL (ref 0.0–0.1)
Basophils Relative: 0 %
EOS ABS: 0 10*3/uL (ref 0.0–0.7)
Eosinophils Relative: 0 %
HCT: 30.4 % — ABNORMAL LOW (ref 39.0–52.0)
Hemoglobin: 10 g/dL — ABNORMAL LOW (ref 13.0–17.0)
Lymphocytes Relative: 8 %
Lymphs Abs: 1.4 10*3/uL (ref 0.7–4.0)
MCH: 28.1 pg (ref 26.0–34.0)
MCHC: 32.9 g/dL (ref 30.0–36.0)
MCV: 85.4 fL (ref 78.0–100.0)
MONO ABS: 0.9 10*3/uL (ref 0.1–1.0)
Monocytes Relative: 5 %
NEUTROS PCT: 87 %
Neutro Abs: 15.4 10*3/uL — ABNORMAL HIGH (ref 1.7–7.7)
PLATELETS: 204 10*3/uL (ref 150–400)
RBC: 3.56 MIL/uL — AB (ref 4.22–5.81)
RDW: 18.4 % — ABNORMAL HIGH (ref 11.5–15.5)
WBC: 17.7 10*3/uL — AB (ref 4.0–10.5)

## 2017-04-21 LAB — BLOOD GAS, ARTERIAL
Acid-Base Excess: 1 mmol/L (ref 0.0–2.0)
Bicarbonate: 24.9 mmol/L (ref 20.0–28.0)
DRAWN BY: 36277
FIO2: 21
O2 Saturation: 96.5 %
PH ART: 7.431 (ref 7.350–7.450)
Patient temperature: 98.6
pCO2 arterial: 38.1 mmHg (ref 32.0–48.0)
pO2, Arterial: 80.9 mmHg — ABNORMAL LOW (ref 83.0–108.0)

## 2017-04-21 LAB — LACTATE DEHYDROGENASE: LDH: 212 U/L — AB (ref 98–192)

## 2017-04-21 LAB — COMPREHENSIVE METABOLIC PANEL
ALK PHOS: 97 U/L (ref 38–126)
ALT: 11 U/L — AB (ref 17–63)
AST: 25 U/L (ref 15–41)
Albumin: 2.6 g/dL — ABNORMAL LOW (ref 3.5–5.0)
Anion gap: 9 (ref 5–15)
BUN: 46 mg/dL — AB (ref 6–20)
CALCIUM: 8.3 mg/dL — AB (ref 8.9–10.3)
CO2: 24 mmol/L (ref 22–32)
CREATININE: 2.31 mg/dL — AB (ref 0.61–1.24)
Chloride: 100 mmol/L — ABNORMAL LOW (ref 101–111)
GFR calc non Af Amer: 30 mL/min — ABNORMAL LOW (ref >60)
GFR, EST AFRICAN AMERICAN: 35 mL/min — AB (ref >60)
Glucose, Bld: 70 mg/dL (ref 65–99)
Potassium: 4.3 mmol/L (ref 3.5–5.1)
SODIUM: 133 mmol/L — AB (ref 135–145)
Total Bilirubin: 1.7 mg/dL — ABNORMAL HIGH (ref 0.3–1.2)
Total Protein: 5.7 g/dL — ABNORMAL LOW (ref 6.5–8.1)

## 2017-04-21 LAB — PROTIME-INR
INR: 1.25
Prothrombin Time: 15.8 seconds — ABNORMAL HIGH (ref 11.4–15.2)

## 2017-04-21 LAB — GLUCOSE, CAPILLARY
GLUCOSE-CAPILLARY: 129 mg/dL — AB (ref 65–99)
Glucose-Capillary: 89 mg/dL (ref 65–99)
Glucose-Capillary: 80 mg/dL (ref 65–99)
Glucose-Capillary: 90 mg/dL (ref 65–99)
Glucose-Capillary: 117 mg/dL — ABNORMAL HIGH (ref 65–99)

## 2017-04-21 LAB — PHOSPHORUS: Phosphorus: 4.3 mg/dL (ref 2.5–4.6)

## 2017-04-21 LAB — MAGNESIUM: MAGNESIUM: 2.1 mg/dL (ref 1.7–2.4)

## 2017-04-21 LAB — COOXEMETRY PANEL
CARBOXYHEMOGLOBIN: 2.7 % — AB (ref 0.5–1.5)
METHEMOGLOBIN: 1.2 % (ref 0.0–1.5)
O2 Saturation: 77.1 %
TOTAL HEMOGLOBIN: 9.6 g/dL — AB (ref 12.0–16.0)

## 2017-04-21 MED ORDER — BOOST / RESOURCE BREEZE PO LIQD: 1.0000 | Freq: Three times a day (TID) | ORAL | Status: DC

## 2017-04-21 MED ORDER — CHLORHEXIDINE GLUCONATE CLOTH 2 % EX PADS
6.0000 | MEDICATED_PAD | Freq: Every day | CUTANEOUS | Status: DC
  Administered 2017-04-21 – 2017-05-19 (×29): 6 via TOPICAL

## 2017-04-21 MED ORDER — SODIUM CHLORIDE 0.9% FLUSH
10.0000 mL | Freq: Two times a day (BID) | INTRAVENOUS | Status: DC
  Administered 2017-04-21 – 2017-04-28 (×10): 10 mL
  Administered 2017-04-30: 40 mL
  Administered 2017-05-01: 10 mL
  Administered 2017-05-01: 20 mL
  Administered 2017-05-02: 30 mL
  Administered 2017-05-04: 10 mL
  Administered 2017-05-05: 30 mL
  Administered 2017-05-05 – 2017-05-10 (×7): 10 mL
  Administered 2017-05-11: 20 mL
  Administered 2017-05-11 – 2017-05-18 (×13): 10 mL
  Administered 2017-05-28: 30 mL
  Administered 2017-05-28 – 2017-06-01 (×6): 10 mL
  Administered 2017-06-02: 20 mL
  Administered 2017-06-03 – 2017-06-14 (×14): 10 mL
  Administered 2017-06-15: 30 mL
  Administered 2017-06-16 – 2017-06-23 (×11): 10 mL
  Administered 2017-06-24: 30 mL
  Administered 2017-06-24 – 2017-06-25 (×2): 10 mL

## 2017-04-21 MED ORDER — ENSURE ENLIVE PO LIQD
237.0000 mL | Freq: Two times a day (BID) | ORAL | Status: DC
  Administered 2017-04-22 – 2017-04-23 (×3): 237 mL via ORAL

## 2017-04-21 MED ORDER — PHENOL 1.4 % MT LIQD
1.0000 | OROMUCOSAL | Status: DC | PRN
  Filled 2017-04-21: qty 177

## 2017-04-21 MED ORDER — MILRINONE LACTATE IN DEXTROSE 20-5 MG/100ML-% IV SOLN
0.1000 ug/kg/min | INTRAVENOUS | Status: DC
  Administered 2017-04-21 – 2017-04-22 (×2): 0.2 ug/kg/min via INTRAVENOUS
  Filled 2017-04-21 (×2): qty 100

## 2017-04-21 MED ORDER — SODIUM CHLORIDE 0.9% FLUSH
10.0000 mL | INTRAVENOUS | Status: DC | PRN
  Administered 2017-05-18 – 2017-05-27 (×2): 10 mL
  Administered 2017-05-27: 30 mL
  Administered 2017-05-27 – 2017-05-28 (×2): 10 mL
  Filled 2017-04-21 (×5): qty 40

## 2017-04-21 MED ORDER — WARFARIN SODIUM 2.5 MG PO TABS
2.5000 mg | ORAL_TABLET | Freq: Once | ORAL | Status: DC
  Filled 2017-04-21: qty 1

## 2017-04-21 MED ORDER — WARFARIN SODIUM 5 MG PO TABS
5.0000 mg | ORAL_TABLET | Freq: Once | ORAL | Status: AC
  Administered 2017-04-21: 5 mg via ORAL
  Filled 2017-04-21: qty 1

## 2017-04-21 MED ORDER — COLCHICINE 0.6 MG PO TABS
0.6000 mg | ORAL_TABLET | Freq: Every day | ORAL | Status: DC
  Administered 2017-04-21 – 2017-04-22 (×2): 0.6 mg via ORAL
  Filled 2017-04-21 (×3): qty 1

## 2017-04-21 MED ORDER — BISACODYL 10 MG RE SUPP
10.0000 mg | Freq: Once | RECTAL | Status: AC
Start: 2017-04-21 — End: 2017-04-21
  Administered 2017-04-21: 10 mg via RECTAL
  Filled 2017-04-21: qty 1

## 2017-04-21 NOTE — Progress Notes (Signed)
Peripherally Inserted Central Catheter/Midline Placement  The IV Nurse has discussed with the patient and/or persons authorized to consent for the patient, the purpose of this procedure and the potential benefits and risks involved with this procedure.  The benefits include less needle sticks, lab draws from the catheter, and the patient may be discharged home with the catheter. Risks include, but not limited to, infection, bleeding, blood clot (thrombus formation), and puncture of an artery; nerve damage and irregular heartbeat and possibility to perform a PICC exchange if needed/ordered by physician.  Alternatives to this procedure were also discussed.  Bard Power PICC patient education guide, fact sheet on infection prevention and patient information card has been provided to patient /or left at bedside.    PICC/Midline Placement Documentation        Maximino GreenlandLumban, Victor Granados Albarece 04/21/2017, 10:06 AM

## 2017-04-21 NOTE — Progress Notes (Signed)
Patient ID: James [name], male   DOB: [birth year], 56 y.o.   MRN: [id number]  This NP visited patient at the bedside for palliative needs and ongoing emotional support.  Patient is OOB in chair and his wife is at bedside.    Created space for both to share thoughts and feeling regarding current medical situation and LVAD experience.   [name] tells me "its tough but I wasn't expecting anything else"   Both are very optimistic and look forward to continued improvement.    Encouraged to call with questions or concerns   PMT will continue to support holistically    Time in 1600          Time out 1620    Total time spent on the unit was  20 minutes  Greater than 50% of the time was spent in counseling and coordination of care  Lorinda CreedMary Rhina Kramme NP  Palliative Medicine Team Team Phone # [phone] Pager [phone]

## 2017-04-21 NOTE — Plan of Care (Signed)
Problem: Activity: Goal: Risk for activity intolerance will decrease Outcome: Progressing Pt tolerating getting OOB 2-3 times a day without difficulty, staying up for 3-4 hours at a time.  Problem: Education: Goal: Knowledge of the prescribed therapeutic regimen will improve Outcome: Progressing Pt's wife beginning education about daily dressing changes, requesting to video for future reference.  Problem: Respiratory: Goal: Ability to maintain adequate ventilation will improve Outcome: Progressing Pt adequately demonstrates C/DB exercises, maintaining O2 sats > 96 % on room air. Minimal D.O.E. Noted with activity.

## 2017-04-21 NOTE — Progress Notes (Signed)
CSw met at bedside with patient and wife. Patient stated he is feeling good alhtough gets nauseous at times. Patient stated he walked around the hallway and attempted to switch from wall to battery power. Wife at bedside and very supportive. CSW spoke with patient's mother via phone and reviewed caregiver role. Mom had no concerns and hopes to visit again tomorrow. CSW will continue to follow for supportive needs throughout implant hospitalization. Raquel Sarna, [place], [place]

## 2017-04-21 NOTE — Progress Notes (Signed)
Patient ID: James [name], male   DOB: 06-Aug-1961, 56 y.o.   MRN: [id number] HVAD Rounding Note  Subjective:    Complains of gas pains. Eating some. Says he is not passing flatus and no BM yet.  Walked 350 ft today.  Co-ox 77% this am and milrinone decreased to 0.2  CVP 7 this pm  Had hypotension with low CVP last pm and briefly back on epi and speed decreased.    LVAD INTERROGATION:  HVAD:  Flow 5.6 liters/min, speed 2760, power 5  Objective:    Vital Signs:   Temp:  [97.9 F (36.6 C)-98.5 F (36.9 C)] 98.5 F (36.9 C) (07/02 1500) Pulse Rate:  [83-111] 99 (07/02 1600) Resp:  [21-32] 24 (07/02 1600) BP: (85-113)/(57-89) 105/75 (07/02 1600) SpO2:  [96 %-100 %] 97 % (07/02 1600) Weight:  [95.2 kg (209 lb 14.1 oz)] 95.2 kg (209 lb 14.1 oz) (07/02 0700) Last BM Date: 04/16/17 Mean arterial Pressure 80  Intake/Output:   Intake/Output Summary (Last 24 hours) at 04/21/17 1752 Last data filed at 04/21/17 1600  Gross per 24 hour  Intake           1488.1 ml  Output              900 ml  Net            588.1 ml     Physical Exam: General:  Well appearing. No resp difficulty HEENT: normal Cor: diminished heart sounds with LVAD hum present. Lungs: clear Chest incision healing well Abdomen: hypoactive BS, mildly distended, nontender Extremities: no cyanosis, clubbing, rash, edema Neuro: alert & orientedx3, cranial nerves grossly intact. moves all 4 extremities w/o difficulty. Affect pleasant  Telemetry: sinus 100  Labs: Basic Metabolic Panel:  Recent Labs Lab 03/23/2017 1638  03/30/2017 2033 03/30/2017 2204  04/18/17 0416 04/18/17 1640 04/19/17 0304 04/20/17 0323 04/21/17 0334  NA 137  < > 137 137  --  135  --  135 133* 133*  K 4.1  < > 4.7 4.8  --  4.4  --  4.3 4.1 4.3  CL 102  < > 97*  --   --  103  --  101 100* 100*  CO2 28  --   --   --   --  25  --  24 26 24   GLUCOSE 78  < > 126* 136*  --  139*  --  119* 73 70  BUN 35*  < > 32*  --   --  32*  --  32* 38* 46*   CREATININE 1.66*  < > 1.30*  --   < > 1.65* 1.87* 1.87* 2.10* 2.31*  CALCIUM 7.7*  --   --   --   --  8.3*  --  8.5* 8.0* 8.3*  MG 1.5*  --   --   --   < > 2.0 2.0 2.0 2.1 2.1  PHOS  --   --   --   --   --  3.2  --  4.8* 4.5 4.3  < > = values in this interval not displayed.  Liver Function Tests:  Recent Labs Lab 04/18/17 0416 04/19/17 0304 04/20/17 0323 04/21/17 0334  AST 38 39 27 25  ALT 14* 13* 11* 11*  ALKPHOS 66 80 82 97  BILITOT 4.5* 4.1* 2.5* 1.7*  PROT 4.7* 5.0* 4.8* 5.7*  ALBUMIN 2.9* 2.5* 2.2* 2.6*   No results for input(s): LIPASE, AMYLASE in the last 168 hours. No  results for input(s): AMMONIA in the last 168 hours.  CBC:  Recent Labs Lab 04/18/17 0416 04/18/17 1640 04/19/17 0304 04/20/17 0323 04/21/17 0334  WBC 15.7* 17.5* 18.9* 14.6* 17.7*  NEUTROABS 14.3*  --  16.5* 12.2* 15.4*  HGB 8.8* 8.6* 7.9* 7.2* 10.0*  HCT 26.2* 26.1* 24.0* 22.0* 30.4*  MCV 81.6 84.7 84.2 85.6 85.4  PLT 138* 148* 140* 139* 204    INR:  Recent Labs Lab 04/11/2017 1638 04/18/17 0416 04/19/17 0304 04/20/17 0323 04/21/17 0334  INR 1.46 1.31 1.45 1.44 1.25   LDH: 212  Other results:  EKG:   Imaging: Dg Chest Port 1 View  Addendum Date: 04/21/2017   ADDENDUM REPORT: 04/21/2017 10:54 ADDENDUM: The PICC line is approximately 6 cm from the cavoatrial junction. Withdrawal of approximately 6 cm should be considered . Electronically Signed   By: Maisie Fus  Register   On: 04/21/2017 10:54   Result Date: 04/21/2017 CLINICAL DATA:  Right PICC line insertion. EXAM: PORTABLE CHEST 1 VIEW COMPARISON:  04/21/2017 . FINDINGS: Right IJ sheath in stable position. Right PICC line tip noted in the right atrium. Cardiac pacer noted with lead tip in the right ventricle. Prior CABG. Left ventricular assist device noted in stable position. Cardiomegaly . Low lung volumes with basilar atelectasis . IMPRESSION: 1. Right IJ sheath noted in the superior vena cava. Right PICC line noted with tip in the  right atrium. 2. Prior CABG. Cardiac pacer and left tree consistent by stable position. Stable cardiomegaly. No CHF. 3. Low lung volumes with basilar atelectasis. Electronically Signed: By: Maisie Fus  Register On: 04/21/2017 10:33   Dg Chest Port 1 View  Result Date: 04/21/2017 CLINICAL DATA:  Atelectasis EXAM: PORTABLE CHEST 1 VIEW COMPARISON:  April 20, 2017 FINDINGS: There is persistent bibasilar atelectasis. Degree of inspiration is shallow. There is no frank edema or consolidation. There is cardiomegaly with mild pulmonary venous hypertension. Left ventricular assist device is present. Pacemaker lead attached to right ventricle. No evident new opacity. Postoperative change noted in right shoulder. No adenopathy. IMPRESSION: Shallow inspiration. Persistent atelectasis in the bases. No new opacity. Underlying pulmonary vascular congestion, stable. Stable cardiac silhouette. The questionable free intraperitoneal air noted on study 1 day prior is not appreciable on current examination. Note that pneumoperitoneum could be obscured on semi-erect portable chest examination. Electronically Signed   By: Bretta Bang III M.D.   On: 04/21/2017 07:23   Dg Chest Port 1 View  Result Date: 04/20/2017 CLINICAL DATA:  LVAD EXAM: PORTABLE CHEST 1 VIEW COMPARISON:  04/19/2017 FINDINGS: Interval removal of Swan-Ganz catheter. Right chest tube removal. No pneumothorax. LVAD device in place. There is bibasilar atelectasis or infiltrates, right greater than left. Possible small layering right effusion. Double lucency sign of around bowel within the right upper quadrant suggests possibility of free intraperitoneal air. Consider further evaluation with decubitus view of the abdomen. Cardiomegaly with vascular congestion. IMPRESSION: Cardiomegaly, vascular congestion. Bibasilar atelectasis, right greater than left. Findings similar to prior study. Possible small layering right effusion. No pneumothorax. Question free  intraperitoneal air. Consider further evaluation with decubitus view. Electronically Signed   By: Charlett Nose M.D.   On: 04/20/2017 10:15      Medications:     Scheduled Medications: . acetaminophen  1,000 mg Oral Q6H   Or  . acetaminophen (TYLENOL) oral liquid 160 mg/5 mL  1,000 mg Per Tube Q6H  . amiodarone  200 mg Oral BID  . aspirin EC  325 mg Oral Daily   Or  .  aspirin  324 mg Per Tube Daily   Or  . aspirin  300 mg Rectal Daily  . atorvastatin  80 mg Oral q1800  . bisacodyl  10 mg Oral Daily   Or  . bisacodyl  10 mg Rectal Daily  . bisacodyl  10 mg Rectal Once  . Chlorhexidine Gluconate Cloth  6 each Topical Q0600  . Chlorhexidine Gluconate Cloth  6 each Topical Daily  . colchicine  0.6 mg Oral Q supper  . digoxin  0.0625 mg Oral Daily  . docusate sodium  200 mg Oral Daily  . febuxostat  120 mg Oral Daily  . guaiFENesin  600 mg Oral BID  . insulin aspart  0-24 Units Subcutaneous Q4H  . insulin detemir  15 Units Subcutaneous Daily  . mouth rinse  15 mL Mouth Rinse BID  . metoCLOPramide (REGLAN) injection  10 mg Intravenous Q6H  . mupirocin ointment  1 application Nasal BID  . pantoprazole  40 mg Oral Daily  . sodium chloride flush  10-40 mL Intracatheter Q12H  . sodium chloride flush  10-40 mL Intracatheter Q12H  . warfarin  5 mg Oral ONCE-1800  . Warfarin - Physician Dosing Inpatient   Does not apply q1800     Infusions: . sodium chloride Stopped (04/19/17 1200)  . sodium chloride Stopped (04/19/17 1200)  . sodium chloride Stopped (04/21/17 1500)  . sodium chloride    . sodium chloride    . lactated ringers    . lactated ringers 20 mL/hr at 04/21/17 1600  . milrinone 0.2 mcg/kg/min (04/21/17 1600)     PRN Medications:  sodium chloride, hydrALAZINE, morphine injection, neomycin-bacitracin-polymyxin, ondansetron (ZOFRAN) IV, oxyCODONE, phenol, simethicone, sodium chloride flush, sodium chloride flush, traMADol  Assessment/Plan/Discussion:    POD 4 s/p  HVAD for ischemic cardiomyopathy with acute on chronic systolic heart failure, EF 15% with moderate RV dysfunction. Returned to OR for bleeding from raw mediastinal tissues. Hemodynamics look good this evening.  pump speed had to be decreased back to 2760 due to hypotension, low CVP and suction event. Milrinone decreased to 0.2  PICC line placed  Coumadin today 5 mg today since INR has not bumped  Stage 3 CKD: creat slightly higher today to 2.3. Probably ATN from surgery. He has low CVP despite wt being significantly over preop. Diuresis on hold due to hypotension and suction event.  DM: preop Hgb A1c 5.9. Glucose under good control. ContinueLevemir and SSI.  Chronic anemia felt to be due to chronic disease. Hgb improved after transfusion yesterday.  Gout: continue previous meds.   Hx of arrhythmias preop: continuepo amio. ICD in place but will wait to turn on until next week.  Malnutrition: encourage po nutrition. He has pressure sore on right leg and buttock.  Ileus: lots of gas in colon on CXR and having gas pains. Will give dulcolax supp tonight.   Keep pump drain in today.  Continue mobilization    I reviewed the LVAD parameters from today, and compared the results to the patient's prior recorded data. LVAD equipment check completed andis in good working order. Back-up equipment present.      Length of Stay: [address]10  Alleen BorneBryan K Niranjan Rufener 04/21/2017, 5:52 PM

## 2017-04-21 NOTE — Progress Notes (Signed)
Physical Therapy Treatment Patient Details Name: James [name] MRN: [id number] DOB: October 03, 1961 Today's Date: 04/21/2017    History of Present Illness 56 y.o. male  admitted on 04/05/2017 with significant PMH for gout, DMII, HTN, CKD, CAD S/P CABGx6 2012, chronic systolic heart failure and [employer] jude ICD. presents now s/p heatmate LVAD implant    PT Comments    Pt admitted with above diagnosis. Pt currently with functional limitations due to the deficits listed below (see PT Problem List). Pt was able to ambulate with eva walker on unit doing very well needing min to mod assist +2 overall with mobility.   Progressing well.  Pt will benefit from skilled PT to increase their independence and safety with mobility to allow discharge to the venue listed below.     Follow Up Recommendations  Home health PT;Supervision/Assistance - 24 hour     Equipment Recommendations   (TBD)    Recommendations for Other Services       Precautions / Restrictions Precautions Precautions: Sternal Precaution Comments: LVAD (heartmate) Restrictions Weight Bearing Restrictions: No    Mobility  Bed Mobility Overal bed mobility: Needs Assistance Bed Mobility: Supine to Sit;Sit to Supine     Supine to sit: +2 for physical assistance;Mod assist     General bed mobility comments: mod assist of 2 persons to elevate trunk to upright position at EOB, patient able to initiate LE movement to EOB.  Transfers Overall transfer level: Needs assistance Equipment used:  (EVa walker) Transfers: Sit to/from Stand Sit to Stand: Min assist;Mod assist;+2 physical assistance;From elevated surface         General transfer comment: Pt was able to follow cues for hand placement for sit to stand to follow precautions.   Ambulation/Gait Ambulation/Gait assistance: Min assist;+2 safety/equipment (3rd person for chair follow but did not need it. ) Ambulation Distance (Feet): 370 Feet Assistive device:  (EVA walker) Gait  Pattern/deviations: Step-through pattern;Decreased stride length;Drifts right/left;Trunk flexed   Gait velocity interpretation: <1.8 ft/sec, indicative of risk for recurrent falls General Gait Details: Pt was able to ambulate with use of Eva walker wtih cues to stand tall as pt leans forward at times.  Does well with cues.  Able to steer [name] walker with cues only.  Tolerated very well. Occasional standing rest breaks.     Stairs            Wheelchair Mobility    Modified Rankin (Stroke Patients Only)       Balance Overall balance assessment: Needs assistance Sitting-balance support: No upper extremity supported;Feet supported Sitting balance-Leahy Scale: Fair Sitting balance - Comments: able to sit EOB with min guard    Standing balance support: Bilateral upper extremity supported;During functional activity Standing balance-Leahy Scale: Poor Standing balance comment: relies on light UE support                            Cognition Arousal/Alertness: Awake/alert Behavior During Therapy: WFL for tasks assessed/performed Overall Cognitive Status: Within Functional Limits for tasks assessed                                        Exercises      General Comments General comments (skin integrity, edema, etc.): Pt was able to unplug the data port cable (already on batteries) and was able to hook the cable back at end of  treatment.  Pt had trouble with the zippers and plan to adapt this for pt per OT.  HVAD parameters maintained at 5.4L/min, 2760 speed, and 4.9 watts.  HR 102-138 bpm but pt without symptoms.  O2 97% on RA.        Pertinent Vitals/Pain Pain Assessment: Faces Faces Pain Scale: Hurts a little bit Pain Location: sternal/chest pain, gout pain in joints Pain Descriptors / Indicators: Aching;Discomfort;Grimacing;Guarding;Sore Pain Intervention(s): Limited activity within patient's tolerance;Monitored during session;Repositioned    Home  Living                      Prior Function            PT Goals (current goals can now be found in the care plan section) Progress towards PT goals: Progressing toward goals    Frequency    Min 3X/week      PT Plan Current plan remains appropriate    Co-evaluation PT/OT/SLP Co-Evaluation/Treatment: Yes Reason for Co-Treatment: Complexity of the patient's impairments (multi-system involvement) PT goals addressed during session: Mobility/safety with mobility        AM-PAC PT "6 Clicks" Daily Activity  Outcome Measure  Difficulty turning over in bed (including adjusting bedclothes, sheets and blankets)?: A Lot Difficulty moving from lying on back to sitting on the side of the bed? : A Lot Difficulty sitting down on and standing up from a chair with arms (e.g., wheelchair, bedside commode, etc,.)?: A Lot Help needed moving to and from a bed to chair (including a wheelchair)?: A Lot Help needed walking in hospital room?: A Lot Help needed climbing 3-5 steps with a railing? : A Lot 6 Click Score: 12    End of Session Equipment Utilized During Treatment: Gait belt Activity Tolerance: Patient tolerated treatment well Patient left: with call bell/phone within reach;in chair;with family/visitor present Nurse Communication: Mobility status PT Visit Diagnosis: Unsteadiness on feet (R26.81);Muscle weakness (generalized) (M62.81);Pain Pain - part of body:  (joints and chest (incisional pain))     Time: [id number] PT Time Calculation (min) (ACUTE ONLY): 45 min  Charges:  $Gait Training: 23-37 mins                    G Codes:       James [name],PT Acute Rehabilitation [phone] (pager)    James [name] James [name] 04/21/2017, 2:45 PM

## 2017-04-21 NOTE — Progress Notes (Signed)
Occupational Therapy Treatment Patient Details Name: James [name] MRN: [id number] DOB: Nov 12, 1960 Today's Date: 04/21/2017    History of present illness 56 y.o. male  admitted on 04/14/2017 with significant PMH for gout, DMII, HTN, CKD, CAD S/P CABGx6 2012, chronic systolic heart failure and St jude ICD. presents now s/p heatmate LVAD implant   OT comments  Pt with significant improvements today.  He was seen in conjunction with PT.  Pt able to ambulate with EVA walker ~3270ft and minA +2.  He requires assist to manage VAD equipment - he will benefit from adaptations to bag to make maneuvering zippers easier [facility] secondary to gouty deformities.  Max A, overall for ADLs.    Follow Up Recommendations  Home health OT;Supervision/Assistance - 24 hour    Equipment Recommendations  3 in 1 bedside commode    Recommendations for Other Services      Precautions / Restrictions Precautions Precautions: Sternal Precaution Comments: LVAD (heartmate) Restrictions Weight Bearing Restrictions: No       Mobility Bed Mobility Overal bed mobility: Needs Assistance Bed Mobility: Supine to Sit;Sit to Supine     Supine to sit: +2 for physical assistance;Mod assist     General bed mobility comments: mod assist of 2 persons to elevate trunk to upright position at EOB, patient able to initiate LE movement to EOB.  Transfers Overall transfer level: Needs assistance Equipment used:  (EVa walker) Transfers: Sit to/from Stand Sit to Stand: Min assist;Mod assist;+2 physical assistance;From elevated surface         General transfer comment: Pt was able to follow cues for hand placement for sit to stand to follow precautions.     Balance Overall balance assessment: Needs assistance Sitting-balance support: No upper extremity supported;Feet supported Sitting balance-Leahy Scale: Fair Sitting balance - Comments: able to sit EOB with min guard    Standing balance support: Bilateral  upper extremity supported;During functional activity Standing balance-Leahy Scale: Poor Standing balance comment: relies on light UE support                           ADL either performed or assessed with clinical judgement   ADL Overall ADL's : Needs assistance/impaired Eating/Feeding: Moderate assistance;Sitting;Bed level Eating/Feeding Details (indicate cue type and reason): due to gout and deformity                      Toilet Transfer: Moderate assistance;+2 for safety/equipment;Ambulation;BSC;RW   Toileting- Clothing Manipulation and Hygiene: Total assistance;Bed level       Functional mobility during ADLs: Minimal assistance;Moderate assistance;+2 for physical assistance;Rolling walker       Vision       Perception     Praxis      Cognition Arousal/Alertness: Awake/alert Behavior During Therapy: WFL for tasks assessed/performed Overall Cognitive Status: Within Functional Limits for tasks assessed                                          Exercises     Shoulder Instructions       General Comments Pt was able to unplug the data port cable (already on batteries) and was able to hook the cable back at end of treatment.  Pt had trouble with the zippers and plan to adapt this for pt per OT.  HVAD parameters maintained at  5.4L/min, 2760 speed, and 4.9 watts.  HR 102-138 bpm but pt without symptoms.  O2 97% on RA.      Pertinent Vitals/ Pain       Pain Assessment: Faces Faces Pain Scale: Hurts a little bit Pain Location: sternal/chest pain, gout pain in joints Pain Descriptors / Indicators: Aching;Discomfort;Grimacing;Guarding;Sore Pain Intervention(s): Monitored during session  Home Living                                          Prior Functioning/Environment              Frequency  Min 2X/week        Progress Toward Goals  OT Goals(current goals can now be found in the care plan section)   Progress towards OT goals: Progressing toward goals     Plan Discharge plan remains appropriate    Co-evaluation    PT/OT/SLP Co-Evaluation/Treatment: Yes Reason for Co-Treatment: Complexity of the patient's impairments (multi-system involvement);For patient/therapist safety PT goals addressed during session: Mobility/safety with mobility OT goals addressed during session: ADL's and self-care      AM-PAC PT "6 Clicks" Daily Activity     Outcome Measure   Help from another person eating meals?: A Lot Help from another person taking care of personal grooming?: A Lot Help from another person toileting, which includes using toliet, bedpan, or urinal?: A Lot Help from another person bathing (including washing, rinsing, drying)?: A Lot Help from another person to put on and taking off regular upper body clothing?: A Lot Help from another person to put on and taking off regular lower body clothing?: Total 6 Click Score: 11    End of Session    OT Visit Diagnosis: Unsteadiness on feet (R26.81);Muscle weakness (generalized) (M62.81)   Activity Tolerance Patient tolerated treatment well   Patient Left in chair;with call bell/phone within reach;with family/visitor present   Nurse Communication Mobility status        Time: 1610-9604 OT Time Calculation (min): 44 min  Charges: OT General Charges $OT Visit: 1 Procedure OT Treatments $Therapeutic Activity: 8-22 mins  [employer], OTR/L [phone]    Jeani Hawking M 04/21/2017, 4:39 PM

## 2017-04-21 NOTE — Progress Notes (Signed)
LVAD Inpatient Coordinator Rounding Note:  Admitted 04/15/2017 due to A/C Heart failure.   [employer] LVAD implanted on 09-13-17 by Dr. Laneta SimmersBartle.   Vital signs: HR: 102 Doppler Pressure: Automatic BP: 106/87 (94) O2 Sat: 98 Wt:207>209     LVAD interrogation reveals:  Speed:2760 Flow: 5.7 Power:  5.0 Alarms: 2 suction alarms on 6/30 Peak: 8 Trough: 4 HCT: 30 Low flow alarm setting: 3 High watt alarm setting: 7  Suction on [place] turned on today  Blood Products: 6/28>4 PRBC's, 6 FFP 7/1> 1 PRBC  Drive Line: Daily Dressing Kits with Aquacell AG silver strips per protocol.   Labs:  LDH trend:160 (pre VAD)>227>212  INR trend: 1.31>1.25  Anticoagulation Plan: -INR Goal: 2-2.5 (eventually will start warfarin per surgery) -ASA Dose: 325 mg  Adverse Events on VAD: -Return to OR 6/28  with high chest tube output, evacuation of mediastinal hematoma  Plan/Recommendations:   1. Please educate family on daily dressing changes with aquacell silver strips per protocol. 2. PICC line placed today. 3. Worked with family on changing batteries and wall power, will bring discharge binder up later this afternoon.  4. Family is requesting video of dressing change, we will try to accommodate this.   Carlton AdamSarah Herbert RN, VAD Coordinator 24/7 pager [phone]

## 2017-04-21 NOTE — Progress Notes (Signed)
Patient ID: James [name], male   DOB: 1961/02/25, 56 y.o.   MRN: [id number]   Advanced Heart Failure VAD Team Note  Subjective:    Initially admitted and aggressively diuresed.    To OR for HVAD placement 6/28.  Returned to the OR that evening with high chest tube output, evacuation of mediastinal hematoma.   Extubated 6/29.   Feels ok today. Chest sore. Breathing ok.  LVAD speed increased to 2820 yesterday due to increased peak/trough. He got an IV Lasix bolus then had suction events and MAP fell, epinephrine was restarted then later stopped. Off NE and NO. Remains on milrinone 0.3.  Co-ox 77%.  CVP 3-4.   Creatinine up 1.6-> 1.8 -> 2.1-> 2.31. Got 1 unit PRBCs yesterday.   - CXR: bibasilar atelectasis, stable pulmonary vascular congestion.   HVAD INTERROGATION:  HVAD:  Flow 5.3 liters/min, speed 2760, power 4.9, peak 8/trough 3.7.  Suction events yesterday with increase in speed, none today.   Objective:    Vital Signs:   Temp:  [97.9 F (36.6 C)-98.5 F (36.9 C)] 98.5 F (36.9 C) (07/02 0700) Pulse Rate:  [53-108] 102 (07/02 0700) Resp:  [14-32] 24 (07/02 0700) BP: (83-113)/(57-92) 91/75 (07/02 0600) SpO2:  [95 %-100 %] 97 % (07/02 0700) Arterial Line BP: (80-110)/(67-88) 87/70 (07/01 1220) Weight:  [209 lb 14.1 oz (95.2 kg)] 209 lb 14.1 oz (95.2 kg) (07/02 0700) Last BM Date: 04/16/17 Mean arterial Pressure 80s  Intake/Output:   Intake/Output Summary (Last 24 hours) at 04/21/17 0751 Last data filed at 04/21/17 0700  Gross per 24 hour  Intake           1492.7 ml  Output             1170 ml  Net            322.7 ml     Physical Exam    General:  NAD HEENT: normal anicteric Neck: supple. RIJ cordis (swan out).  JVP not elevated.  Cor: Sternal dressing ok. LVAD hum.  Lungs: Decreased breath sounds at bases.   Abdomen: soft NT/ND good BS Extremities: warm. 1+ ankle edema. RLE ulcer with dressing  + diffuse severe gouty changes Neuro: alert & oriented x 3,  cranial nerves grossly intact. moves all 4 extremities w/o difficulty. Affect pleasant   Telemetry   Personally reviewed. Sinus tachy 100s   Labs   Basic Metabolic Panel:  Recent Labs Lab 04/04/2017 1638  04/16/2017 2033 03/29/2017 2204  04/18/17 0416 04/18/17 1640 04/19/17 0304 04/20/17 0323 04/21/17 0334  NA 137  < > 137 137  --  135  --  135 133* 133*  K 4.1  < > 4.7 4.8  --  4.4  --  4.3 4.1 4.3  CL 102  < > 97*  --   --  103  --  101 100* 100*  CO2 28  --   --   --   --  25  --  24 26 24   GLUCOSE 78  < > 126* 136*  --  139*  --  119* 73 70  BUN 35*  < > 32*  --   --  32*  --  32* 38* 46*  CREATININE 1.66*  < > 1.30*  --   < > 1.65* 1.87* 1.87* 2.10* 2.31*  CALCIUM 7.7*  --   --   --   --  8.3*  --  8.5* 8.0* 8.3*  MG 1.5*  --   --   --   < >  2.0 2.0 2.0 2.1 2.1  PHOS  --   --   --   --   --  3.2  --  4.8* 4.5 4.3  < > = values in this interval not displayed.  Liver Function Tests:  Recent Labs Lab 04/18/17 0416 04/19/17 0304 04/20/17 0323 04/21/17 0334  AST 38 39 27 25  ALT 14* 13* 11* 11*  ALKPHOS 66 80 82 97  BILITOT 4.5* 4.1* 2.5* 1.7*  PROT 4.7* 5.0* 4.8* 5.7*  ALBUMIN 2.9* 2.5* 2.2* 2.6*   No results for input(s): LIPASE, AMYLASE in the last 168 hours. No results for input(s): AMMONIA in the last 168 hours.  CBC:  Recent Labs Lab 04/18/17 0416 04/18/17 1640 04/19/17 0304 04/20/17 0323 04/21/17 0334  WBC 15.7* 17.5* 18.9* 14.6* 17.7*  NEUTROABS 14.3*  --  16.5* 12.2* 15.4*  HGB 8.8* 8.6* 7.9* 7.2* 10.0*  HCT 26.2* 26.1* 24.0* 22.0* 30.4*  MCV 81.6 84.7 84.2 85.6 85.4  PLT 138* 148* 140* 139* 204    INR:  Recent Labs Lab 04/04/2017 1638 04/18/17 0416 04/19/17 0304 04/20/17 0323 04/21/17 0334  INR 1.46 1.31 1.45 1.44 1.25    Other results:     Imaging   Dg Chest Port 1 View  Result Date: 04/21/2017 CLINICAL DATA:  Atelectasis EXAM: PORTABLE CHEST 1 VIEW COMPARISON:  April 20, 2017 FINDINGS: There is persistent bibasilar  atelectasis. Degree of inspiration is shallow. There is no frank edema or consolidation. There is cardiomegaly with mild pulmonary venous hypertension. Left ventricular assist device is present. Pacemaker lead attached to right ventricle. No evident new opacity. Postoperative change noted in right shoulder. No adenopathy. IMPRESSION: Shallow inspiration. Persistent atelectasis in the bases. No new opacity. Underlying pulmonary vascular congestion, stable. Stable cardiac silhouette. The questionable free intraperitoneal air noted on study 1 day prior is not appreciable on current examination. Note that pneumoperitoneum could be obscured on semi-erect portable chest examination. Electronically Signed   By: Bretta Bang III M.D.   On: 04/21/2017 07:23   Dg Chest Port 1 View  Result Date: 04/20/2017 CLINICAL DATA:  LVAD EXAM: PORTABLE CHEST 1 VIEW COMPARISON:  04/19/2017 FINDINGS: Interval removal of Swan-Ganz catheter. Right chest tube removal. No pneumothorax. LVAD device in place. There is bibasilar atelectasis or infiltrates, right greater than left. Possible small layering right effusion. Double lucency sign of around bowel within the right upper quadrant suggests possibility of free intraperitoneal air. Consider further evaluation with decubitus view of the abdomen. Cardiomegaly with vascular congestion. IMPRESSION: Cardiomegaly, vascular congestion. Bibasilar atelectasis, right greater than left. Findings similar to prior study. Possible small layering right effusion. No pneumothorax. Question free intraperitoneal air. Consider further evaluation with decubitus view. Electronically Signed   By: Charlett Nose M.D.   On: 04/20/2017 10:15     Medications:     Scheduled Medications: . acetaminophen  1,000 mg Oral Q6H   Or  . acetaminophen (TYLENOL) oral liquid 160 mg/5 mL  1,000 mg Per Tube Q6H  . amiodarone  200 mg Oral BID  . aspirin EC  325 mg Oral Daily   Or  . aspirin  324 mg Per Tube Daily     Or  . aspirin  300 mg Rectal Daily  . atorvastatin  80 mg Oral q1800  . bisacodyl  10 mg Oral Daily   Or  . bisacodyl  10 mg Rectal Daily  . Chlorhexidine Gluconate Cloth  6 each Topical Q0600  . colchicine  0.6 mg Oral Daily  .  digoxin  0.0625 mg Oral Daily  . docusate sodium  200 mg Oral Daily  . febuxostat  120 mg Oral Daily  . guaiFENesin  600 mg Oral BID  . insulin aspart  0-24 Units Subcutaneous Q4H  . insulin detemir  15 Units Subcutaneous Daily  . mouth rinse  15 mL Mouth Rinse BID  . metoCLOPramide (REGLAN) injection  10 mg Intravenous Q6H  . mupirocin ointment  1 application Nasal BID  . pantoprazole  40 mg Oral Daily  . sodium chloride flush  10-40 mL Intracatheter Q12H  . Warfarin - Physician Dosing Inpatient   Does not apply q1800    Infusions: . sodium chloride Stopped (04/19/17 1200)  . sodium chloride Stopped (04/19/17 1200)  . sodium chloride 20 mL/hr at 04/21/17 0700  . sodium chloride    . sodium chloride    . EPINEPHrine 4 mg in dextrose 5% 250 mL infusion (16 mcg/mL) Stopped (04/20/17 1000)  . ferumoxytol Stopped (04/14/17 1236)  . lactated ringers    . lactated ringers 20 mL/hr at 04/20/17 1800  . magnesium sulfate    . milrinone    . norepinephrine (LEVOPHED) Adult infusion Stopped (04/20/17 0200)    PRN Medications: sodium chloride, hydrALAZINE, morphine injection, neomycin-bacitracin-polymyxin, ondansetron (ZOFRAN) IV, oxyCODONE, simethicone, sodium chloride flush, traMADol   Patient Profile   56 yo with CAD s/p CABG, ischemic cardiomyopathy/chronic systolic CHF, tophaceous gout, and CKD stage 3 was admitted for diuresis and consideration for LVAD placement. S/p HVAD on 6/28  Assessment/Plan:    1. Acute/chronic systolic CHF: Ischemic cardiomyopathy.  St Jude ICD.  Echo (6/18) with EF 15%, mildly dilated RV with moderately decreased systolic function.  RHC 6/22 with elevated filling pressures and evidence of RV failure though PAPi score 1.95  so not prohibitive.  He has been seen at [facility] and turned down for transplant with severe tophaceous gout and concern for worsening on transplant meds.  He was diuresed extensively, repeat RHC 6/26 with marked improvement in filling pressures and stable cardiac output on milrinone.  HVAD placement 6/28 and had to return to OR to evacuate mediastinal hematoma.  POD #4.  Now off NE/NO/epinephrine. Co-ox 77%, CVP 3-4.  Creatinine up to 2.31 in setting of low MAP last night.  - Keep HVAD speed at 2760 (down from 2820 with suction events). Still fairly wide gap between peak/trough but less than yesterday.  - Hold Lasix today.  - Decrease milrinone to 0.2.  - On ASA and warfarin.  2. AKI on CKD stage 3: Creatinine up to 2.3 today, was hypotensive transiently yesterday.  CVP low.   - Hold Lasix today.   3. Anemia: Chronic, seen by GI, they do not feel that he needs endoscopy.  FOBT negative. He had 1 unit PRBCs 7/1 with appropriate bump.  4. CAD: s/p CABG 2012.  Stable.   5. Gout: Severe tophaceous gout. Controlled today.  On Uloric, will continue colchicine daily.    6. Arrhythmias: In NSR on po amiodarone. Will continue. Probably stop prior to discharge.   7. Malnutrition: Encouraged po intake.  8. Leukocytosis: WBCs fluctuating.  Higher today.  No fever.   I reviewed the LVAD parameters from today, and compared the results to the patient's prior recorded data.  No programming changes were made.  The LVAD is functioning within specified parameters.  The patient performs LVAD self-test daily.  LVAD interrogation was negative for any significant power changes, alarms or PI events/speed drops.  LVAD equipment check completed  and is in good working order.  Back-up equipment present.   LVAD education done on emergency procedures and precautions and reviewed exit site care.   Marca Anconaalton Josejulian Tarango, MD 04/21/2017, 7:51 AM  VAD Team --- VAD ISSUES ONLY--- Pager [phone] (7am - 7am)  Advanced Heart Failure Team    Pager [phone] (M-F; 7a - 4p)  Please contact [facility] for night-coverage after hours (4p -7a ) and weekends on [email or web address]

## 2017-04-22 LAB — CBC WITH DIFFERENTIAL/PLATELET
BASOS ABS: 0 10*3/uL (ref 0.0–0.1)
Basophils Relative: 0 %
EOS PCT: 1 %
Eosinophils Absolute: 0.1 10*3/uL (ref 0.0–0.7)
HCT: 30.1 % — ABNORMAL LOW (ref 39.0–52.0)
Hemoglobin: 9.6 g/dL — ABNORMAL LOW (ref 13.0–17.0)
LYMPHS ABS: 1.6 10*3/uL (ref 0.7–4.0)
Lymphocytes Relative: 11 %
MCH: 27.5 pg (ref 26.0–34.0)
MCHC: 31.9 g/dL (ref 30.0–36.0)
MCV: 86.2 fL (ref 78.0–100.0)
MONO ABS: 0.7 10*3/uL (ref 0.1–1.0)
Monocytes Relative: 5 %
Neutro Abs: 12.4 10*3/uL — ABNORMAL HIGH (ref 1.7–7.7)
Neutrophils Relative %: 83 %
PLATELETS: 247 10*3/uL (ref 150–400)
RBC: 3.49 MIL/uL — AB (ref 4.22–5.81)
RDW: 18.5 % — AB (ref 11.5–15.5)
WBC: 14.8 10*3/uL — AB (ref 4.0–10.5)

## 2017-04-22 LAB — GLUCOSE, CAPILLARY
GLUCOSE-CAPILLARY: 113 mg/dL — AB (ref 65–99)
GLUCOSE-CAPILLARY: 135 mg/dL — AB (ref 65–99)
GLUCOSE-CAPILLARY: 129 mg/dL — AB (ref 65–99)
GLUCOSE-CAPILLARY: 131 mg/dL — AB (ref 65–99)
Glucose-Capillary: 56 mg/dL — ABNORMAL LOW (ref 65–99)
Glucose-Capillary: 87 mg/dL (ref 65–99)
Glucose-Capillary: 96 mg/dL (ref 65–99)

## 2017-04-22 LAB — TYPE AND SCREEN
ABO/RH(D): A POS
Antibody Screen: NEGATIVE
Unit division: 0

## 2017-04-22 LAB — COMPREHENSIVE METABOLIC PANEL
ALT: 9 U/L — AB (ref 17–63)
ANION GAP: 8 (ref 5–15)
AST: 20 U/L (ref 15–41)
Albumin: 2.4 g/dL — ABNORMAL LOW (ref 3.5–5.0)
Alkaline Phosphatase: 90 U/L (ref 38–126)
BUN: 48 mg/dL — ABNORMAL HIGH (ref 6–20)
CHLORIDE: 100 mmol/L — AB (ref 101–111)
CO2: 24 mmol/L (ref 22–32)
CREATININE: 2.22 mg/dL — AB (ref 0.61–1.24)
Calcium: 8.1 mg/dL — ABNORMAL LOW (ref 8.9–10.3)
GFR calc non Af Amer: 32 mL/min — ABNORMAL LOW (ref >60)
GFR, EST AFRICAN AMERICAN: 37 mL/min — AB (ref >60)
Glucose, Bld: 120 mg/dL — ABNORMAL HIGH (ref 65–99)
POTASSIUM: 3.8 mmol/L (ref 3.5–5.1)
SODIUM: 132 mmol/L — AB (ref 135–145)
Total Bilirubin: 1.5 mg/dL — ABNORMAL HIGH (ref 0.3–1.2)
Total Protein: 5.4 g/dL — ABNORMAL LOW (ref 6.5–8.1)

## 2017-04-22 LAB — BPAM RBC
Blood Product Expiration Date: 201807052359
ISSUE DATE / TIME: 201807011151
Unit Type and Rh: 6200

## 2017-04-22 LAB — POCT I-STAT 3, ART BLOOD GAS (G3+)
ACID-BASE EXCESS: 2 mmol/L (ref 0.0–2.0)
Bicarbonate: 26.9 mmol/L (ref 20.0–28.0)
O2 SAT: 96 %
Patient temperature: 36.5
TCO2: 28 mmol/L (ref 0–100)
pCO2 arterial: 43.1 mmHg (ref 32.0–48.0)
pH, Arterial: 7.4 (ref 7.350–7.450)
pO2, Arterial: 78 mmHg — ABNORMAL LOW (ref 83.0–108.0)

## 2017-04-22 LAB — PHOSPHORUS: Phosphorus: 3.6 mg/dL (ref 2.5–4.6)

## 2017-04-22 LAB — POCT I-STAT, CHEM 8
BUN: 31 mg/dL — ABNORMAL HIGH (ref 6–20)
CALCIUM ION: 1.16 mmol/L (ref 1.15–1.40)
Chloride: 99 mmol/L — ABNORMAL LOW (ref 101–111)
Creatinine, Ser: 1.8 mg/dL — ABNORMAL HIGH (ref 0.61–1.24)
GLUCOSE: 135 mg/dL — AB (ref 65–99)
HCT: 25 % — ABNORMAL LOW (ref 39.0–52.0)
HEMOGLOBIN: 8.5 g/dL — AB (ref 13.0–17.0)
Potassium: 4.3 mmol/L (ref 3.5–5.1)
Sodium: 135 mmol/L (ref 135–145)
TCO2: 26 mmol/L (ref 0–100)

## 2017-04-22 LAB — PROTIME-INR
INR: 1.33
Prothrombin Time: 16.6 seconds — ABNORMAL HIGH (ref 11.4–15.2)

## 2017-04-22 LAB — LACTATE DEHYDROGENASE: LDH: 207 U/L — AB (ref 98–192)

## 2017-04-22 LAB — COOXEMETRY PANEL
Carboxyhemoglobin: 2.4 % — ABNORMAL HIGH (ref 0.5–1.5)
METHEMOGLOBIN: 1.1 % (ref 0.0–1.5)
O2 SAT: 81.2 %
Total hemoglobin: 9.6 g/dL — ABNORMAL LOW (ref 12.0–16.0)

## 2017-04-22 LAB — MAGNESIUM: Magnesium: 2 mg/dL (ref 1.7–2.4)

## 2017-04-22 MED ORDER — ISOSORB DINITRATE-HYDRALAZINE 20-37.5 MG PO TABS
0.5000 | ORAL_TABLET | Freq: Three times a day (TID) | ORAL | Status: DC
  Administered 2017-04-22 (×3): 0.5 via ORAL
  Filled 2017-04-22 (×4): qty 0.5

## 2017-04-22 MED ORDER — FUROSEMIDE 10 MG/ML IJ SOLN
40.0000 mg | Freq: Once | INTRAMUSCULAR | Status: AC
  Administered 2017-04-22: 40 mg via INTRAVENOUS
  Filled 2017-04-22: qty 4

## 2017-04-22 MED ORDER — WARFARIN SODIUM 5 MG PO TABS
5.0000 mg | ORAL_TABLET | Freq: Once | ORAL | Status: AC
  Administered 2017-04-22: 5 mg via ORAL
  Filled 2017-04-22: qty 1

## 2017-04-22 MED FILL — Sodium Chloride IV Soln 0.9%: INTRAVENOUS | Qty: 250 | Status: AC

## 2017-04-22 MED FILL — Vasopressin Inj 20 Unit/ML: INTRAMUSCULAR | Qty: 3 | Status: AC

## 2017-04-22 NOTE — Progress Notes (Signed)
Nutrition Follow-up  DOCUMENTATION CODES:   Severe malnutrition in context of chronic illness  INTERVENTION:   Continue:  Ensure Enlive po BID, each supplement provides 350 kcal and 20 grams of protein  NUTRITION DIAGNOSIS:   Malnutrition (severe) related to chronic illness (CHF) as evidenced by severe depletion of body fat, severe depletion of muscle mass. Ongoing.   GOAL:   Patient will meet greater than or equal to 90% of their needs Progressing.   MONITOR:   PO intake, I & O's, Supplement acceptance  ASSESSMENT:   56 yo male with hx of HTN, gout, obesity, chronic edema, CAD, ischemic cardiomyopathy, DM2, AICD, MI, CHF, renal insufficiency who was admitted on 6/22 with acute on chronic systolic heart failure.  6/28 HVAD placement 6/29 extubated 7/1 Soft diet started, meal completion 60%, ensure enlive added  Medications reviewed and include: dulcolax, colace, novolog, levimer, reglan Labs reviewed: Na 132 (L) CBG's: [phone] Pt is negative 4 L since admission, wt similar to admission weight  Diet Order:  DIET SOFT Room service appropriate? Yes; Fluid consistency: Thin  Skin:  Wound (see comment) (open wound R leg; boil R buttocks)  Last BM:  7/2  Height:   Ht Readings from Last 1 Encounters:  04/22/17 6' (1.829 m)    Weight:   Wt Readings from Last 1 Encounters:  04/22/17 213 lb 10 oz (96.9 kg)    Ideal Body Weight:  80.9 kg  BMI:  Body mass index is 28.97 kg/m.  Estimated Nutritional Needs:   Kcal:  2100-2300  Protein:  110-125 grams  Fluid:  2 L/day  EDUCATION NEEDS:   No education needs identified at this time  Kendell BaneHeather Moishy Laday RD, LDN, CNSC [phone] Pager [phone] After Hours Pager

## 2017-04-22 NOTE — Progress Notes (Signed)
Patient ID: James [name], male   DOB: 1961/07/20, 56 y.o.   MRN: [id number]   Advanced Heart Failure VAD Team Note  Subjective:    Initially admitted and aggressively diuresed.    To OR for HVAD placement 6/28.  Returned to the OR that evening with high chest tube output, evacuation of mediastinal hematoma.   Extubated 6/29.   Walked farther yesterday, breathing ok.  Bowel movement yesterday.   LVAD speed increased to 2820 7/2 due to increased peak/trough. He got an IV Lasix bolus then had suction events and MAP fell, epinephrine was restarted then later stopped. Speed decreased to 7960.  Off NE and NO.   Remains on milrinone 0.2.  Co-ox 81%.  CVP 5-8.  Weight up. MAP in 90s.   Creatinine up 1.6-> 1.8 -> 2.1-> 2.31 -> 2.22.   Got 1 unit PRBCs on 7/2.   HVAD INTERROGATION:  HVAD:  Flow 5.2 liters/min, speed 2760, power 4.9, peak 8/trough 3.5.  No events.   Objective:    Vital Signs:   Temp:  [98 F (36.7 C)-99.4 F (37.4 C)] 99.4 F (37.4 C) (07/03 0345) Pulse Rate:  [83-111] 103 (07/03 0700) Resp:  [18-32] 18 (07/03 0700) BP: (87-117)/(71-97) 103/92 (07/03 0700) SpO2:  [96 %-99 %] 99 % (07/03 0700) Weight:  [213 lb 10 oz (96.9 kg)] 213 lb 10 oz (96.9 kg) (07/03 0500) Last BM Date: 04/21/17 Mean arterial Pressure 90s  Intake/Output:   Intake/Output Summary (Last 24 hours) at 04/22/17 0741 Last data filed at 04/22/17 0700  Gross per 24 hour  Intake           1016.8 ml  Output             1045 ml  Net            -28.2 ml     Physical Exam    General:  NAD HEENT: normal anicteric Neck: supple. RIJ cordis (swan out).  JVP not elevated.  Cor: Sternal dressing ok. LVAD hum.  Lungs: Decreased breath sounds at bases.   Abdomen: soft NT/ND good BS Extremities: warm. 1+ ankle edema. RLE ulcer with dressing  + diffuse severe gouty changes Neuro: alert & oriented x 3, cranial nerves grossly intact. moves all 4 extremities w/o difficulty. Affect pleasant   Telemetry    Personally reviewed. Sinus tachy 100s   Labs   Basic Metabolic Panel:  Recent Labs Lab 04/18/17 0416 04/18/17 1640 04/19/17 0304 04/20/17 0323 04/21/17 0334 04/22/17 0339  NA 135  --  135 133* 133* 132*  K 4.4  --  4.3 4.1 4.3 3.8  CL 103  --  101 100* 100* 100*  CO2 25  --  24 26 24 24   GLUCOSE 139*  --  119* 73 70 120*  BUN 32*  --  32* 38* 46* 48*  CREATININE 1.65* 1.87* 1.87* 2.10* 2.31* 2.22*  CALCIUM 8.3*  --  8.5* 8.0* 8.3* 8.1*  MG 2.0 2.0 2.0 2.1 2.1 2.0  PHOS 3.2  --  4.8* 4.5 4.3 3.6    Liver Function Tests:  Recent Labs Lab 04/18/17 0416 04/19/17 0304 04/20/17 0323 04/21/17 0334 04/22/17 0339  AST 38 39 27 25 20   ALT 14* 13* 11* 11* 9*  ALKPHOS 66 80 82 97 90  BILITOT 4.5* 4.1* 2.5* 1.7* 1.5*  PROT 4.7* 5.0* 4.8* 5.7* 5.4*  ALBUMIN 2.9* 2.5* 2.2* 2.6* 2.4*   No results for input(s): LIPASE, AMYLASE in the last 168 hours.  No results for input(s): AMMONIA in the last 168 hours.  CBC:  Recent Labs Lab 04/18/17 0416 04/18/17 1640 04/19/17 0304 04/20/17 0323 04/21/17 0334 04/22/17 0339  WBC 15.7* 17.5* 18.9* 14.6* 17.7* 14.8*  NEUTROABS 14.3*  --  16.5* 12.2* 15.4* 12.4*  HGB 8.8* 8.6* 7.9* 7.2* 10.0* 9.6*  HCT 26.2* 26.1* 24.0* 22.0* 30.4* 30.1*  MCV 81.6 84.7 84.2 85.6 85.4 86.2  PLT 138* 148* 140* 139* 204 247    INR:  Recent Labs Lab 04/18/17 0416 04/19/17 0304 04/20/17 0323 04/21/17 0334 04/22/17 0339  INR 1.31 1.45 1.44 1.25 1.33    Other results:     Imaging   Dg Chest Port 1 View  Addendum Date: 04/21/2017   ADDENDUM REPORT: 04/21/2017 10:54 ADDENDUM: The PICC line is approximately 6 cm from the cavoatrial junction. Withdrawal of approximately 6 cm should be considered . Electronically Signed   By: Maisie Fushomas  Register   On: 04/21/2017 10:54   Result Date: 04/21/2017 CLINICAL DATA:  Right PICC line insertion. EXAM: PORTABLE CHEST 1 VIEW COMPARISON:  04/21/2017 . FINDINGS: Right IJ sheath in stable position. Right PICC  line tip noted in the right atrium. Cardiac pacer noted with lead tip in the right ventricle. Prior CABG. Left ventricular assist device noted in stable position. Cardiomegaly . Low lung volumes with basilar atelectasis . IMPRESSION: 1. Right IJ sheath noted in the superior vena cava. Right PICC line noted with tip in the right atrium. 2. Prior CABG. Cardiac pacer and left tree consistent by stable position. Stable cardiomegaly. No CHF. 3. Low lung volumes with basilar atelectasis. Electronically Signed: By: Maisie Fushomas  Register On: 04/21/2017 10:33   Dg Chest Port 1 View  Result Date: 04/21/2017 CLINICAL DATA:  Atelectasis EXAM: PORTABLE CHEST 1 VIEW COMPARISON:  April 20, 2017 FINDINGS: There is persistent bibasilar atelectasis. Degree of inspiration is shallow. There is no frank edema or consolidation. There is cardiomegaly with mild pulmonary venous hypertension. Left ventricular assist device is present. Pacemaker lead attached to right ventricle. No evident new opacity. Postoperative change noted in right shoulder. No adenopathy. IMPRESSION: Shallow inspiration. Persistent atelectasis in the bases. No new opacity. Underlying pulmonary vascular congestion, stable. Stable cardiac silhouette. The questionable free intraperitoneal air noted on study 1 day prior is not appreciable on current examination. Note that pneumoperitoneum could be obscured on semi-erect portable chest examination. Electronically Signed   By: Bretta BangWilliam  Woodruff III M.D.   On: 04/21/2017 07:23     Medications:     Scheduled Medications: . acetaminophen  1,000 mg Oral Q6H   Or  . acetaminophen (TYLENOL) oral liquid 160 mg/5 mL  1,000 mg Per Tube Q6H  . amiodarone  200 mg Oral BID  . aspirin EC  325 mg Oral Daily   Or  . aspirin  324 mg Per Tube Daily   Or  . aspirin  300 mg Rectal Daily  . atorvastatin  80 mg Oral q1800  . bisacodyl  10 mg Oral Daily   Or  . bisacodyl  10 mg Rectal Daily  . Chlorhexidine Gluconate Cloth  6  each Topical Daily  . colchicine  0.6 mg Oral Q supper  . digoxin  0.0625 mg Oral Daily  . docusate sodium  200 mg Oral Daily  . febuxostat  120 mg Oral Daily  . feeding supplement (ENSURE ENLIVE)  237 mL Oral BID BM  . furosemide  40 mg Intravenous Once  . guaiFENesin  600 mg Oral BID  . insulin  aspart  0-24 Units Subcutaneous Q4H  . insulin detemir  15 Units Subcutaneous Daily  . metoCLOPramide (REGLAN) injection  10 mg Intravenous Q6H  . mupirocin ointment  1 application Nasal BID  . pantoprazole  40 mg Oral Daily  . sodium chloride flush  10-40 mL Intracatheter Q12H  . sodium chloride flush  10-40 mL Intracatheter Q12H  . Warfarin - Physician Dosing Inpatient   Does not apply q1800    Infusions: . sodium chloride Stopped (04/19/17 1200)  . sodium chloride Stopped (04/19/17 1200)  . sodium chloride Stopped (04/21/17 1500)  . sodium chloride    . sodium chloride    . lactated ringers    . lactated ringers 20 mL/hr at 04/22/17 0700  . milrinone 0.2 mcg/kg/min (04/22/17 0700)    PRN Medications: sodium chloride, hydrALAZINE, morphine injection, neomycin-bacitracin-polymyxin, ondansetron (ZOFRAN) IV, oxyCODONE, phenol, simethicone, sodium chloride flush, sodium chloride flush, traMADol   Patient Profile   56 yo with CAD s/p CABG, ischemic cardiomyopathy/chronic systolic CHF, tophaceous gout, and CKD stage 3 was admitted for diuresis and consideration for LVAD placement. S/p HVAD on 6/28  Assessment/Plan:    1. Acute/chronic systolic CHF: Ischemic cardiomyopathy.  St Jude ICD.  Echo (6/18) with EF 15%, mildly dilated RV with moderately decreased systolic function.  RHC 6/22 with elevated filling pressures and evidence of RV failure though PAPi score 1.95 so not prohibitive.  He has been seen at [facility] and turned down for transplant with severe tophaceous gout and concern for worsening on transplant meds.  He was diuresed extensively, repeat RHC 6/26 with marked improvement in  filling pressures and stable cardiac output on milrinone.  HVAD placement 6/28 and had to return to OR to evacuate mediastinal hematoma.  Now off NE/NO/epinephrine. Co-ox 81%, CVP 5-8 but weight up.  Creatinine down at bit to 2.21.  MAP running high in 90s.  - Keep HVAD speed at 2760 (down from 2820 with suction events).  - Lasix 40 mg IV x 1 today.  - Decrease milrinone to 0.1.  - On ASA and warfarin.  - Add Bidil 1/2 tab tid for elevated BP.  - Continue digoxin, check level in morning.  2. AKI on CKD stage 3: Suspect a component of peri-op ATN. Creatinine 2.22, hopefully it has plateaued. - As above, will give 1 dose of Lasix today with rising weight.    3. Anemia: Chronic, seen by GI, they do not feel that he needs endoscopy.  FOBT negative. He had 1 unit PRBCs 7/1 with appropriate bump.  Stable today.  4. CAD: s/p CABG 2012.  Stable.   5. Gout: Severe tophaceous gout. Controlled today.  On Uloric, will continue colchicine daily.    6. Arrhythmias: In NSR on po amiodarone. Will continue. Probably stop prior to discharge.   7. Malnutrition: Encouraged po intake. Getting Ensure.  8. Leukocytosis: WBCs fluctuating.  Lower today.  No fever.  9. Deconditioning: Walk with cardiac rehab.   I reviewed the LVAD parameters from today, and compared the results to the patient's prior recorded data.  No programming changes were made.  The LVAD is functioning within specified parameters.  The patient performs LVAD self-test daily.  LVAD interrogation was negative for any significant power changes, alarms or PI events/speed drops.  LVAD equipment check completed and is in good working order.  Back-up equipment present.   LVAD education done on emergency procedures and precautions and reviewed exit site care.  Marca Ancona, MD 04/22/2017, 7:41 AM  VAD  Team --- VAD ISSUES ONLY--- Pager [phone] (7am - 7am)  Advanced Heart Failure Team  Pager [phone] (M-F; 7a - 4p)  Please contact [facility] for  night-coverage after hours (4p -7a ) and weekends on [email or web address]

## 2017-04-22 NOTE — Progress Notes (Signed)
Patient ID: James [name], male   DOB: 06/17/1961, 56 y.o.   MRN: [id number]  SICU Evening Rounds:  He has been hemodynamically stable today on Milrinone 0.1  CVP 12 tonight.  HVAD waveforms look good. Flow 4.7  Urine output ok but no large diuresis after lasix today  Ambulated  Bowels working

## 2017-04-22 NOTE — Progress Notes (Signed)
Physical Therapy Treatment Patient Details Name: James [name] MRN: [id number] DOB: January 26, 1961 Today's Date: 04/22/2017    History of Present Illness 56 y.o. male  admitted on 04/07/2017 with significant PMH for gout, DMII, HTN, CKD, CAD S/P CABGx6 2012, chronic systolic heart failure and St jude ICD. presents now s/p heatmate LVAD implant    PT Comments    Pt admitted with above diagnosis. Pt currently with functional limitations due to balance and endurance deficits as well as assist needed for education regarding HVAD equipment.  Pt needed cues and assist for ambulation with Carley HammedEva walker as well as education regarding sternal precautions and equipment.  Will continue acute PT.   Pt will benefit from skilled PT to increase their independence and safety with mobility to allow discharge to the venue listed below.     Follow Up Recommendations  Home health PT;Supervision/Assistance - 24 hour     Equipment Recommendations   (TBD)    Recommendations for Other Services       Precautions / Restrictions Precautions Precautions: Sternal Precaution Comments: LVAD (heartmate)    Mobility  Bed Mobility Overal bed mobility: Needs Assistance Bed Mobility: Sit to Supine       Sit to supine: Max assist;+2 for physical assistance   General bed mobility comments: max assist to get back into bed due to precautions.  Pt unable to assist to move up in bed due to swelling in LEs - couldn't push up on LEs to scoot.   Transfers Overall transfer level: Needs assistance Equipment used:  (EVa walker) Transfers: Sit to/from Stand Sit to Stand: Min assist;+2 safety/equipment         General transfer comment: Pt was able to follow cues for hand placement for sit to stand to follow precautions.   Ambulation/Gait Ambulation/Gait assistance: Min assist;+2 safety/equipment (3rd person for chair follow but did not need it. ) Ambulation Distance (Feet): 370 Feet Assistive device:  (EVA walker) Gait  Pattern/deviations: Step-through pattern;Decreased stride length;Drifts right/left;Trunk flexed   Gait velocity interpretation: <1.8 ft/sec, indicative of risk for recurrent falls General Gait Details: Pt was able to ambulate with use of Eva walker wtih cues to stand tall as pt leans forward at times.  Does well with cues.  Able to steer Carley Hammedva walker with cues only.  Tolerated very well. Occasional standing rest breaks.     Stairs            Wheelchair Mobility    Modified Rankin (Stroke Patients Only)       Balance Overall balance assessment: Needs assistance Sitting-balance support: No upper extremity supported;Feet supported Sitting balance-Leahy Scale: Fair Sitting balance - Comments: able to sit EOB with min guard    Standing balance support: Bilateral upper extremity supported;During functional activity Standing balance-Leahy Scale: Poor Standing balance comment: relies on light UE support                            Cognition Arousal/Alertness: Awake/alert Behavior During Therapy: WFL for tasks assessed/performed Overall Cognitive Status: Within Functional Limits for tasks assessed                                        Exercises General Exercises - Lower Extremity Ankle Circles/Pumps: AROM;10 reps;Supine Long Arc Quad: Both;5 reps;Seated (limited ROM noted with LE movement) Hip Flexion/Marching: Both;5 reps;Seated (limited ROM noted with  LE movement)    General Comments General comments (skin integrity, edema, etc.): Pt had a lot of difficulty switching from power source and data port to battery as he could not grip the cords to pull.  Tried multiple ways but had to assist pt when when switching to batteries and when switching back to power source and data port.  Pt still strugglying with zippers on one side as well.  HVAD parameters were 5.0 L/Min, 2760 speed, and 4.6 WAtts.  HR 111-133 bpm, O2 94-98% on RA with ambulation.        Pertinent Vitals/Pain Pain Assessment: Faces Faces Pain Scale: Hurts a little bit Pain Location: sternal/chest pain, gout pain in joints Pain Descriptors / Indicators: Aching;Discomfort;Grimacing;Guarding;Sore Pain Intervention(s): Limited activity within patient's tolerance;Monitored during session;Repositioned    Home Living                      Prior Function            PT Goals (current goals can now be found in the care plan section) Progress towards PT goals: Progressing toward goals    Frequency    Min 3X/week      PT Plan Current plan remains appropriate    Co-evaluation              AM-PAC PT "6 Clicks" Daily Activity  Outcome Measure  Difficulty turning over in bed (including adjusting bedclothes, sheets and blankets)?: A Lot Difficulty moving from lying on back to sitting on the side of the bed? : A Lot Difficulty sitting down on and standing up from a chair with arms (e.g., wheelchair, bedside commode, etc,.)?: A Lot Help needed moving to and from a bed to chair (including a wheelchair)?: A Lot Help needed walking in hospital room?: A Little Help needed climbing 3-5 steps with a railing? : A Lot 6 Click Score: 13    End of Session Equipment Utilized During Treatment: Gait belt Activity Tolerance: Patient tolerated treatment well Patient left: with call bell/phone within reach;in chair;with family/visitor present Nurse Communication: Mobility status PT Visit Diagnosis: Unsteadiness on feet (R26.81);Muscle weakness (generalized) (M62.81);Pain Pain - part of body:  (joints and chest (incisional pain))     Time: 1027-1121 PT Time Calculation (min) (ACUTE ONLY): 54 min  Charges:  $Gait Training: 23-37 mins $Self Care/Home Management: 23-37                    G Codes:       Tabias Swayze,PT Acute Rehabilitation [phone] (pager)    Berline Lopes 04/22/2017, 12:16 PM

## 2017-04-22 NOTE — Progress Notes (Signed)
LVAD Inpatient Coordinator Rounding Note:  Admitted 03/23/2017 due to A/C Heart failure.   [employer] LVAD implanted on 07-14-2017 by Dr. Laneta SimmersBartle as DT VAD.  Vital signs: HR: 104 Doppler Pressure: 94 Automatic BP:  97/86 (92) O2 Sat: 98 on RA Wt:207>209>213   LVAD interrogation reveals:  Speed:2760 Flow: 5.5 Power:  4.8 Alarms: 2 suction alarms on 6/30 Peak: 7.9 Trough: 3.2 HCT: 30 Low flow alarm setting: 3 High watt alarm setting: 7  Suction alarm:  on Lavare cycle:  on  Blood Products: 6/28 - 3 PRBC's, 6 FFP - Intra Op 6/28 - 1 PRBC in ICU 7/1> 1 PRBC in ICU  Drive Line: Daily Dressing Kits with Aquacell AG silver strips per protocol.   Labs:  LDH trend:160 (pre VAD)>227>212>207  INR trend: 1.31>1.25>1.33  ICD:  St Jude single lead ICD Therapy: off  Gtts: Milrinone 0.2 mcg/kg/min  Nitric Oxide: weaned off 04/19/17  Anticoagulation Plan: -INR Goal: 2-2.5 (eventually will start warfarin per surgery) -ASA Dose: 325 mg  Adverse Events on VAD: -Return to OR 6/28  with high chest tube output, evacuation of mediastinal hematoma  Plan/Recommendations:   1. Please educate family on daily dressing changes with aquacell silver strips per protocol.  2. Pt attempting changing his power source, but needs his glasses; states wife "is bringing today". 2. VAD coordinator will perform dressing change today and try to film for wife's use.    Hessie DienerMolly Aleaha Fickling RN, VAD Coordinator 24/7 pager [phone]

## 2017-04-23 ENCOUNTER — Inpatient Hospital Stay (HOSPITAL_COMMUNITY): Payer: Medicare PPO

## 2017-04-23 DIAGNOSIS — I5023 Acute on chronic systolic (congestive) heart failure: Secondary | ICD-10-CM

## 2017-04-23 DIAGNOSIS — Z95811 Presence of heart assist device: Secondary | ICD-10-CM

## 2017-04-23 LAB — GLUCOSE, CAPILLARY
GLUCOSE-CAPILLARY: 50 mg/dL — AB (ref 65–99)
GLUCOSE-CAPILLARY: 50 mg/dL — AB (ref 65–99)
GLUCOSE-CAPILLARY: 89 mg/dL (ref 65–99)
GLUCOSE-CAPILLARY: 98 mg/dL (ref 65–99)
Glucose-Capillary: 57 mg/dL — ABNORMAL LOW (ref 65–99)
Glucose-Capillary: 134 mg/dL — ABNORMAL HIGH (ref 65–99)
Glucose-Capillary: 112 mg/dL — ABNORMAL HIGH (ref 65–99)
Glucose-Capillary: 112 mg/dL — ABNORMAL HIGH (ref 65–99)
Glucose-Capillary: 80 mg/dL (ref 65–99)

## 2017-04-23 LAB — COMPREHENSIVE METABOLIC PANEL
ALBUMIN: 2.4 g/dL — AB (ref 3.5–5.0)
ALK PHOS: 95 U/L (ref 38–126)
ALT: 8 U/L — ABNORMAL LOW (ref 17–63)
ANION GAP: 9 (ref 5–15)
AST: 20 U/L (ref 15–41)
BUN: 49 mg/dL — ABNORMAL HIGH (ref 6–20)
CHLORIDE: 100 mmol/L — AB (ref 101–111)
CO2: 24 mmol/L (ref 22–32)
Calcium: 8.2 mg/dL — ABNORMAL LOW (ref 8.9–10.3)
Creatinine, Ser: 2.09 mg/dL — ABNORMAL HIGH (ref 0.61–1.24)
GFR calc Af Amer: 40 mL/min — ABNORMAL LOW (ref >60)
GFR calc non Af Amer: 34 mL/min — ABNORMAL LOW (ref >60)
GLUCOSE: 104 mg/dL — AB (ref 65–99)
POTASSIUM: 3.9 mmol/L (ref 3.5–5.1)
SODIUM: 133 mmol/L — AB (ref 135–145)
Total Bilirubin: 1.2 mg/dL (ref 0.3–1.2)
Total Protein: 5.5 g/dL — ABNORMAL LOW (ref 6.5–8.1)

## 2017-04-23 LAB — CBC WITH DIFFERENTIAL/PLATELET
BASOS PCT: 0 %
BLASTS: 0 %
Band Neutrophils: 0 %
Basophils Absolute: 0 10*3/uL (ref 0.0–0.1)
EOS ABS: 0 10*3/uL (ref 0.0–0.7)
Eosinophils Relative: 0 %
HEMATOCRIT: 31 % — AB (ref 39.0–52.0)
HEMOGLOBIN: 9.9 g/dL — AB (ref 13.0–17.0)
LYMPHS PCT: 7 %
Lymphs Abs: 0.8 10*3/uL (ref 0.7–4.0)
MCH: 28.9 pg (ref 26.0–34.0)
MCHC: 31.9 g/dL (ref 30.0–36.0)
MCV: 90.4 fL (ref 78.0–100.0)
MONO ABS: 1.7 10*3/uL — AB (ref 0.1–1.0)
MYELOCYTES: 0 %
Metamyelocytes Relative: 0 %
Monocytes Relative: 15 %
NEUTROS PCT: 78 %
NRBC: 0 /100{WBCs}
Neutro Abs: 8.7 10*3/uL — ABNORMAL HIGH (ref 1.7–7.7)
OTHER: 0 %
PROMYELOCYTES ABS: 0 %
Platelets: 303 10*3/uL (ref 150–400)
RBC: 3.43 MIL/uL — ABNORMAL LOW (ref 4.22–5.81)
RDW: 18.5 % — ABNORMAL HIGH (ref 11.5–15.5)
WBC: 11.2 10*3/uL — AB (ref 4.0–10.5)

## 2017-04-23 LAB — CUP PACEART REMOTE DEVICE CHECK
Battery Remaining Longevity: 92 mo
Battery Voltage: 3.05 V
Brady Statistic RV Percent Paced: 1 %
HighPow Impedance: 48 Ohm
HighPow Impedance: 48 Ohm
Implantable Lead Implant Date: 20161216
Implantable Lead Location: 753860
Lead Channel Pacing Threshold Amplitude: 0.5 V
Lead Channel Sensing Intrinsic Amplitude: 6.3 mV
Lead Channel Setting Pacing Amplitude: 2.5 V
Lead Channel Setting Sensing Sensitivity: 0.5 mV
MDC IDC MSMT BATTERY REMAINING PERCENTAGE: 86 %
MDC IDC MSMT LEADCHNL RV IMPEDANCE VALUE: 310 Ohm
MDC IDC MSMT LEADCHNL RV PACING THRESHOLD PULSEWIDTH: 0.5 ms
MDC IDC PG IMPLANT DT: 20161216
MDC IDC SESS DTM: 20180620060019
MDC IDC SET LEADCHNL RV PACING PULSEWIDTH: 0.5 ms
Pulse Gen Serial Number: 7308121

## 2017-04-23 LAB — DIGOXIN LEVEL: Digoxin Level: 0.3 ng/mL — ABNORMAL LOW (ref 0.8–2.0)

## 2017-04-23 LAB — MAGNESIUM: MAGNESIUM: 2 mg/dL (ref 1.7–2.4)

## 2017-04-23 LAB — COOXEMETRY PANEL
CARBOXYHEMOGLOBIN: 1.5 % (ref 0.5–1.5)
METHEMOGLOBIN: 1.3 % (ref 0.0–1.5)
O2 SAT: 66.5 %
Total hemoglobin: 9.4 g/dL — ABNORMAL LOW (ref 12.0–16.0)

## 2017-04-23 LAB — LACTATE DEHYDROGENASE: LDH: 222 U/L — ABNORMAL HIGH (ref 98–192)

## 2017-04-23 LAB — PROTIME-INR
INR: 1.68
PROTHROMBIN TIME: 20 s — AB (ref 11.4–15.2)

## 2017-04-23 LAB — LIPASE, BLOOD: Lipase: 93 U/L — ABNORMAL HIGH (ref 11–51)

## 2017-04-23 LAB — PHOSPHORUS: PHOSPHORUS: 3.4 mg/dL (ref 2.5–4.6)

## 2017-04-23 LAB — AMYLASE: Amylase: 91 U/L (ref 28–100)

## 2017-04-23 MED ORDER — DEXTROSE 50 % IV SOLN
INTRAVENOUS | Status: AC
  Administered 2017-04-23: 50 mL
  Filled 2017-04-23: qty 50

## 2017-04-23 MED ORDER — FUROSEMIDE 10 MG/ML IJ SOLN: 40.0000 mg | Freq: Two times a day (BID) | INTRAMUSCULAR | Status: DC

## 2017-04-23 MED ORDER — METOCLOPRAMIDE HCL 10 MG PO TABS
10.0000 mg | ORAL_TABLET | Freq: Three times a day (TID) | ORAL | Status: DC | PRN
  Administered 2017-04-23: 10 mg via ORAL
  Filled 2017-04-23: qty 1

## 2017-04-23 MED ORDER — SPIRONOLACTONE 25 MG PO TABS
12.5000 mg | ORAL_TABLET | Freq: Every day | ORAL | Status: DC
  Administered 2017-04-23: 12.5 mg via ORAL
  Filled 2017-04-23: qty 1

## 2017-04-23 MED ORDER — FUROSEMIDE 10 MG/ML IJ SOLN
60.0000 mg | Freq: Once | INTRAMUSCULAR | Status: AC
  Administered 2017-04-23: 60 mg via INTRAVENOUS
  Filled 2017-04-23: qty 6

## 2017-04-23 MED ORDER — DEXTROSE 50 % IV SOLN
50.0000 mL | Freq: Once | INTRAVENOUS | Status: AC
  Administered 2017-04-23: 50 mL via INTRAVENOUS

## 2017-04-23 MED ORDER — DEXTROSE 50 % IV SOLN
INTRAVENOUS | Status: AC
  Filled 2017-04-23: qty 50

## 2017-04-23 MED ORDER — ISOSORB DINITRATE-HYDRALAZINE 20-37.5 MG PO TABS
1.0000 | ORAL_TABLET | Freq: Three times a day (TID) | ORAL | Status: DC
  Administered 2017-04-23 (×2): 1 via ORAL
  Filled 2017-04-23 (×3): qty 1

## 2017-04-23 MED ORDER — ISOSORB DINITRATE-HYDRALAZINE 20-37.5 MG PO TABS: 1.0000 | ORAL_TABLET | Freq: Three times a day (TID) | ORAL | Status: DC

## 2017-04-23 MED ORDER — POTASSIUM CHLORIDE 20 MEQ/15ML (10%) PO SOLN
20.0000 meq | Freq: Once | ORAL | Status: AC
  Administered 2017-04-23: 20 meq via ORAL
  Filled 2017-04-23: qty 15

## 2017-04-23 MED ORDER — WARFARIN SODIUM 5 MG PO TABS
5.0000 mg | ORAL_TABLET | Freq: Once | ORAL | Status: AC
  Administered 2017-04-23: 5 mg via ORAL
  Filled 2017-04-23: qty 1

## 2017-04-23 MED ORDER — METOCLOPRAMIDE HCL 5 MG/ML IJ SOLN
10.0000 mg | Freq: Four times a day (QID) | INTRAMUSCULAR | Status: AC
  Administered 2017-04-23 (×3): 10 mg via INTRAVENOUS
  Filled 2017-04-23 (×3): qty 2

## 2017-04-23 NOTE — Progress Notes (Signed)
Patient ID: James [name], male   DOB: August 21, 1961, 56 y.o.   MRN: [id number] EVENING ROUNDS NOTE :     [address]       [place] [id number]             [phone]                 6 Days Post-Op Procedure(s) (LRB): MEDIASTINAL EXPLORATION (N/A)  Total Length of Stay:  LOS: 12 days  BP (!) 66/53   Pulse (!) 111   Temp 98 F (36.7 C) (Oral)   Resp (!) 39   Ht 6' (1.829 m)   Wt 213 lb 3 oz (96.7 kg)   SpO2 99%   BMI 28.91 kg/m   .Intake/Output      07/04 0701 - 07/05 0700   P.O.    I.V. (mL/kg) 205.8 (2.1)   Total Intake(mL/kg) 205.8 (2.1)   Urine (mL/kg/hr) 360 (0.3)   Chest Tube    Total Output 360   Net -154.2         . sodium chloride Stopped (04/19/17 1200)  . sodium chloride Stopped (04/19/17 1200)  . sodium chloride Stopped (04/21/17 1500)  . sodium chloride    . sodium chloride    . lactated ringers    . lactated ringers 20 mL/hr at 04/23/17 0800     Lab Results  Component Value Date   WBC 11.2 (H) 04/23/2017   HGB 9.9 (L) 04/23/2017   HCT 31.0 (L) 04/23/2017   PLT 303 04/23/2017   GLUCOSE 104 (H) 04/23/2017   CHOL 79 04/12/2017   TRIG 64 04/12/2017   HDL 23 (L) 04/12/2017   LDLCALC 43 04/12/2017   ALT 8 (L) 04/23/2017   AST 20 04/23/2017   NA 133 (L) 04/23/2017   K 3.9 04/23/2017   CL 100 (L) 04/23/2017   CREATININE 2.09 (H) 04/23/2017   BUN 49 (H) 04/23/2017   CO2 24 04/23/2017   TSH 4.928 (H) 04/12/2017   PSA 0.17 03/03/2017   INR 1.68 04/23/2017   HGBA1C 5.9 (H) 04/15/2017   Cr up  Nausea not able to eat  Foley out -I/o cath once 300 ml out  James OvensEdward B Michio Thier MD  Beeper [phone] Office [phone] 04/23/2017 7:06 PM

## 2017-04-23 NOTE — Progress Notes (Signed)
Patient ID: James [name], male   DOB: May 02, 1961, 56 y.o.   MRN: [id number]   Advanced Heart Failure VAD Team Note  Subjective:    Initially admitted and aggressively diuresed.    To OR for HVAD placement 6/28.  Returned to the OR that evening with high chest tube output, evacuation of mediastinal hematoma.   Extubated 6/29.   Walked around unit yesterday.  Breathing ok.  Had some nausea last night.  Having hiccups.    LVAD speed increased to 2820 7/2 due to increased peak/trough. He got an IV Lasix bolus then had suction events and MAP fell, epinephrine was restarted then later stopped. Speed decreased to 7960.  Off NE and NO.   Remains on milrinone 0.1.  Co-ox 67%.  CVP 9-10.  Weight stable. MAP higher in 100s.   Creatinine 1.6-> 1.8 -> 2.1-> 2.31 -> 2.22 -> 2.09.   Got 1 unit PRBCs on 7/2.   HVAD INTERROGATION:  HVAD:  Flow 4.7 liters/min, speed 2760, power 4.4 W, peak 8/trough 2.5.  No events.   Objective:    Vital Signs:   Temp:  [97.9 F (36.6 C)-99.2 F (37.3 C)] 98.4 F (36.9 C) (07/04 0403) Pulse Rate:  [48-132] 94 (07/04 0600) Resp:  [21-38] 28 (07/04 0600) BP: (97-133)/(75-107) 132/104 (07/04 0600) SpO2:  [97 %-100 %] 98 % (07/04 0600) Weight:  [213 lb 3 oz (96.7 kg)] 213 lb 3 oz (96.7 kg) (07/04 0500) Last BM Date: 04/22/17 Mean arterial Pressure 100s  Intake/Output:   Intake/Output Summary (Last 24 hours) at 04/23/17 0746 Last data filed at 04/23/17 0600  Gross per 24 hour  Intake          1248.61 ml  Output             1365 ml  Net          -116.39 ml     Physical Exam    General:  NAD HEENT: normal anicteric Neck: supple. RIJ cordis (swan out).  JVP 9-10.  Cor: Sternal dressing ok. LVAD hum.  Lungs: Decreased breath sounds at bases.   Abdomen: soft NT/ND good BS Extremities: warm. 1+ ankle edema. RLE ulcer with dressing  + diffuse severe gouty changes Neuro: alert & oriented x 3, cranial nerves grossly intact. moves all 4 extremities w/o  difficulty. Affect pleasant   Telemetry   Personally reviewed. Sinus tachy 100s   Labs   Basic Metabolic Panel:  Recent Labs Lab 04/19/17 0304 04/20/17 0323 04/21/17 0334 04/22/17 0339 04/23/17 0414  NA 135 133* 133* 132* 133*  K 4.3 4.1 4.3 3.8 3.9  CL 101 100* 100* 100* 100*  CO2 24 26 24 24 24   GLUCOSE 119* 73 70 120* 104*  BUN 32* 38* 46* 48* 49*  CREATININE 1.87* 2.10* 2.31* 2.22* 2.09*  CALCIUM 8.5* 8.0* 8.3* 8.1* 8.2*  MG 2.0 2.1 2.1 2.0 2.0  PHOS 4.8* 4.5 4.3 3.6 3.4    Liver Function Tests:  Recent Labs Lab 04/19/17 0304 04/20/17 0323 04/21/17 0334 04/22/17 0339 04/23/17 0414  AST 39 27 25 20 20   ALT 13* 11* 11* 9* 8*  ALKPHOS 80 82 97 90 95  BILITOT 4.1* 2.5* 1.7* 1.5* 1.2  PROT 5.0* 4.8* 5.7* 5.4* 5.5*  ALBUMIN 2.5* 2.2* 2.6* 2.4* 2.4*   No results for input(s): LIPASE, AMYLASE in the last 168 hours. No results for input(s): AMMONIA in the last 168 hours.  CBC:  Recent Labs Lab 04/19/17 0304 04/20/17 0323 04/21/17  1610 04/22/17 0339 04/23/17 0414  WBC 18.9* 14.6* 17.7* 14.8* 11.2*  NEUTROABS 16.5* 12.2* 15.4* 12.4* 8.7*  HGB 7.9* 7.2* 10.0* 9.6* 9.9*  HCT 24.0* 22.0* 30.4* 30.1* 31.0*  MCV 84.2 85.6 85.4 86.2 90.4  PLT 140* 139* 204 247 303    INR:  Recent Labs Lab 04/19/17 0304 04/20/17 0323 04/21/17 0334 04/22/17 0339 04/23/17 0414  INR 1.45 1.44 1.25 1.33 1.68    Other results:     Imaging   Dg Chest Port 1 View  Addendum Date: 04/21/2017   ADDENDUM REPORT: 04/21/2017 10:54 ADDENDUM: The PICC line is approximately 6 cm from the cavoatrial junction. Withdrawal of approximately 6 cm should be considered . Electronically Signed   By: Maisie Fus  Register   On: 04/21/2017 10:54   Result Date: 04/21/2017 CLINICAL DATA:  Right PICC line insertion. EXAM: PORTABLE CHEST 1 VIEW COMPARISON:  04/21/2017 . FINDINGS: Right IJ sheath in stable position. Right PICC line tip noted in the right atrium. Cardiac pacer noted with lead tip  in the right ventricle. Prior CABG. Left ventricular assist device noted in stable position. Cardiomegaly . Low lung volumes with basilar atelectasis . IMPRESSION: 1. Right IJ sheath noted in the superior vena cava. Right PICC line noted with tip in the right atrium. 2. Prior CABG. Cardiac pacer and left tree consistent by stable position. Stable cardiomegaly. No CHF. 3. Low lung volumes with basilar atelectasis. Electronically Signed: ByMaisie Fus  Register On: 04/21/2017 10:33     Medications:     Scheduled Medications: . amiodarone  200 mg Oral BID  . aspirin EC  325 mg Oral Daily   Or  . aspirin  324 mg Per Tube Daily   Or  . aspirin  300 mg Rectal Daily  . atorvastatin  80 mg Oral q1800  . bisacodyl  10 mg Oral Daily   Or  . bisacodyl  10 mg Rectal Daily  . Chlorhexidine Gluconate Cloth  6 each Topical Daily  . colchicine  0.6 mg Oral Q supper  . digoxin  0.0625 mg Oral Daily  . docusate sodium  200 mg Oral Daily  . febuxostat  120 mg Oral Daily  . feeding supplement (ENSURE ENLIVE)  237 mL Oral BID BM  . furosemide  40 mg Intravenous BID  . guaiFENesin  600 mg Oral BID  . insulin aspart  0-24 Units Subcutaneous Q4H  . insulin detemir  15 Units Subcutaneous Daily  . isosorbide-hydrALAZINE  1 tablet Oral TID  . pantoprazole  40 mg Oral Daily  . potassium chloride  20 mEq Oral Once  . sodium chloride flush  10-40 mL Intracatheter Q12H  . sodium chloride flush  10-40 mL Intracatheter Q12H  . spironolactone  12.5 mg Oral Daily  . Warfarin - Physician Dosing Inpatient   Does not apply q1800    Infusions: . sodium chloride Stopped (04/19/17 1200)  . sodium chloride Stopped (04/19/17 1200)  . sodium chloride Stopped (04/21/17 1500)  . sodium chloride    . sodium chloride    . lactated ringers    . lactated ringers 20 mL/hr at 04/22/17 2000    PRN Medications: sodium chloride, hydrALAZINE, metoCLOPramide, morphine injection, neomycin-bacitracin-polymyxin, ondansetron  (ZOFRAN) IV, oxyCODONE, phenol, simethicone, sodium chloride flush, sodium chloride flush, traMADol   Patient Profile   56 yo with CAD s/p CABG, ischemic cardiomyopathy/chronic systolic CHF, tophaceous gout, and CKD stage 3 was admitted for diuresis and consideration for LVAD placement. S/p HVAD on 6/28  Assessment/Plan:  1. Acute/chronic systolic CHF: Ischemic cardiomyopathy.  St Jude ICD.  Echo (6/18) with EF 15%, mildly dilated RV with moderately decreased systolic function.  RHC 6/22 with elevated filling pressures and evidence of RV failure though PAPi score 1.95 so not prohibitive.  [facility] with severe tophaceous gout and concern for worsening on transplant meds.  He was diuresed extensively, repeat RHC 6/26 with marked improvement in filling pressures and stable cardiac output on milrinone.  HVAD placement 6/28 and had to return to OR to evacuate mediastinal hematoma.  Now off NE/NO/epinephrine. Co-ox 67%, CVP 9-10 with stable weight.  Creatinine down at bit to 2.09.  MAP running high in 100s. Increased separation between peak and trough.  - Speed increased to 2820 today.  - Lasix 60 mg IV x 1 today.  - Stop milrinone.   - On ASA and warfarin, INR 1.68.  - MAP elevated, increase Bidil to 1 tab tid.  Will also add spironolactone 12.5 daily.   - Continue digoxin, level ok.   2. AKI on CKD stage 3: Suspect a component of peri-op ATN. Creatinine 2.09, hopefully will trend down. - As above, will give 1 dose of Lasix today again.    3. Anemia: Chronic, seen by GI, they do not feel that he needs endoscopy.  FOBT negative. He had 1 unit PRBCs 7/1 with appropriate bump.  Stable today.  4. CAD: s/p CABG 2012.  Stable.   5. Gout: Severe tophaceous gout. Controlled today.  On Uloric, will continue colchicine daily.    6. Arrhythmias: In NSR on po amiodarone. Will continue. Probably stop prior to discharge.   7. Malnutrition: Encouraged po intake.  Getting Ensure.  8. ID: No evidence for infection. 9. Hiccups: prn Reglan.   10. Deconditioning: Walk with cardiac rehab.   I reviewed the LVAD parameters from today, and compared the results to the patient's prior recorded data.  No programming changes were made.  The LVAD is functioning within specified parameters.  The patient performs LVAD self-test daily.  LVAD interrogation was negative for any significant power changes, alarms or PI events/speed drops.  LVAD equipment check completed and is in good working order.  Back-up equipment present.   LVAD education done on emergency procedures and precautions and reviewed exit site care.  Marca Anconaalton Asha Grumbine, MD 04/23/2017, 7:46 AM  VAD Team --- VAD ISSUES ONLY--- Pager [phone] (7am - 7am)  Advanced Heart Failure Team  Pager [phone] (M-F; 7a - 4p)  Please contact [facility] for night-coverage after hours (4p -7a ) and weekends on [email or web address]

## 2017-04-23 NOTE — Progress Notes (Signed)
Patient ambulated with PT and RN in hallway 295 ft with ava assist, patient up to chair, call bell in reach, patient due to void-bladder scan 388-in and out cath 300, will continue to monitor.  Hermina BartersBOWMAN, Cleo Villamizar M, RN

## 2017-04-23 NOTE — Progress Notes (Signed)
Hypoglycemic Event  CBG: 57  Treatment: D50 IV 50 mL  Symptoms: None  Follow-up CBG: Time:0100 CBG Result:50  Possible Reasons for Event: Unknown  Comments/MD notified:Gave full amp of D50, will continue to monitor patient.     Nishita Isaacks E Nino Combs

## 2017-04-23 NOTE — Progress Notes (Signed)
Hypoglycemic Event  CBG: 50  Treatment: D50 IV 50 mL  Symptoms: None  Follow-up CBG: Time: 2356 CBG Result: 112  Possible Reasons for Event: Vomiting, patient NPO  Comments/MD notified: N/A will continue to monitor   Harvin HazelMegan Jamaury Gumz

## 2017-04-23 NOTE — Progress Notes (Signed)
Hypoglycemic Event  CBG: 56  Treatment: 15 GM carbohydrate snack  Symptoms: None  Follow-up CBG: Time:0020 CBG Result:57   Possible Reasons for Event: Change in activity  Comments/MD notified: Patient still asymptomatic     [employer] E Nino Combs

## 2017-04-23 NOTE — Progress Notes (Signed)
Patient ID: James [name], male   DOB: 11/11/1960, 56 y.o.   MRN: [id number] HVAD Rounding Note  Subjective:    Doen't feel great this am due to hiccups overnight and indigestion.  Pain under control  On Milrinone 0.1 with Co-ox 66.5 CVP 7-9  LVAD INTERROGATION:  HVAD:  Flow 4.8 liters/min, speed 2820, power 4.9  Objective:    Vital Signs:   Temp:  [97.9 F (36.6 C)-99.2 F (37.3 C)] 98.4 F (36.9 C) (07/04 0403) Pulse Rate:  [48-132] 105 (07/04 0745) Resp:  [21-38] 31 (07/04 0745) BP: (97-133)/(75-107) 112/98 (07/04 0700) SpO2:  [97 %-100 %] 98 % (07/04 0745) Weight:  [96.7 kg (213 lb 3 oz)] 96.7 kg (213 lb 3 oz) (07/04 0500) Last BM Date: 04/22/17 Mean arterial Pressure low 100's  Intake/Output:   Intake/Output Summary (Last 24 hours) at 04/23/17 0807 Last data filed at 04/23/17 0745  Gross per 24 hour  Intake            983.8 ml  Output             1355 ml  Net           -371.2 ml     Physical Exam: General:  Looks tired and weak. No resp difficulty HEENT: normal Cor: distant heart sounds with LVAD hum present. Lungs: decreased in lower lobes Abdomen: soft, nontender, nondistended. BS present Extremities: mild edema Neuro: alert & orientedx3, cranial nerves grossly intact. moves all 4 extremities w/o difficulty. Affect pleasant  Telemetry: sinus tach 114  Labs: Basic Metabolic Panel:  Recent Labs Lab 04/19/17 0304 04/20/17 0323 04/21/17 0334 04/22/17 0339 04/23/17 0414  NA 135 133* 133* 132* 133*  K 4.3 4.1 4.3 3.8 3.9  CL 101 100* 100* 100* 100*  CO2 24 26 24 24 24   GLUCOSE 119* 73 70 120* 104*  BUN 32* 38* 46* 48* 49*  CREATININE 1.87* 2.10* 2.31* 2.22* 2.09*  CALCIUM 8.5* 8.0* 8.3* 8.1* 8.2*  MG 2.0 2.1 2.1 2.0 2.0  PHOS 4.8* 4.5 4.3 3.6 3.4    Liver Function Tests:  Recent Labs Lab 04/19/17 0304 04/20/17 0323 04/21/17 0334 04/22/17 0339 04/23/17 0414  AST 39 27 25 20 20   ALT 13* 11* 11* 9* 8*  ALKPHOS 80 82 97 90 95  BILITOT  4.1* 2.5* 1.7* 1.5* 1.2  PROT 5.0* 4.8* 5.7* 5.4* 5.5*  ALBUMIN 2.5* 2.2* 2.6* 2.4* 2.4*   No results for input(s): LIPASE, AMYLASE in the last 168 hours. No results for input(s): AMMONIA in the last 168 hours.  CBC:  Recent Labs Lab 04/19/17 0304 04/20/17 0323 04/21/17 0334 04/22/17 0339 04/23/17 0414  WBC 18.9* 14.6* 17.7* 14.8* 11.2*  NEUTROABS 16.5* 12.2* 15.4* 12.4* 8.7*  HGB 7.9* 7.2* 10.0* 9.6* 9.9*  HCT 24.0* 22.0* 30.4* 30.1* 31.0*  MCV 84.2 85.6 85.4 86.2 90.4  PLT 140* 139* 204 247 303    INR:  Recent Labs Lab 04/19/17 0304 04/20/17 0323 04/21/17 0334 04/22/17 0339 04/23/17 0414  INR 1.45 1.44 1.25 1.33 1.68    Other results:  EKG:   Imaging: Dg Chest Port 1 View  Addendum Date: 04/21/2017   ADDENDUM REPORT: 04/21/2017 10:54 ADDENDUM: The PICC line is approximately 6 cm from the cavoatrial junction. Withdrawal of approximately 6 cm should be considered . Electronically Signed   By: Maisie Fus  Register   On: 04/21/2017 10:54   Result Date: 04/21/2017 CLINICAL DATA:  Right PICC line insertion. EXAM: PORTABLE CHEST 1 VIEW COMPARISON:  04/21/2017 . FINDINGS: Right IJ sheath in stable position. Right PICC line tip noted in the right atrium. Cardiac pacer noted with lead tip in the right ventricle. Prior CABG. Left ventricular assist device noted in stable position. Cardiomegaly . Low lung volumes with basilar atelectasis . IMPRESSION: 1. Right IJ sheath noted in the superior vena cava. Right PICC line noted with tip in the right atrium. 2. Prior CABG. Cardiac pacer and left tree consistent by stable position. Stable cardiomegaly. No CHF. 3. Low lung volumes with basilar atelectasis. Electronically Signed: ByMaisie Fus  Register On: 04/21/2017 10:33      Medications:     Scheduled Medications: . amiodarone  200 mg Oral BID  . aspirin EC  325 mg Oral Daily   Or  . aspirin  324 mg Per Tube Daily   Or  . aspirin  300 mg Rectal Daily  . atorvastatin  80 mg Oral  q1800  . bisacodyl  10 mg Oral Daily   Or  . bisacodyl  10 mg Rectal Daily  . Chlorhexidine Gluconate Cloth  6 each Topical Daily  . colchicine  0.6 mg Oral Q supper  . digoxin  0.0625 mg Oral Daily  . docusate sodium  200 mg Oral Daily  . febuxostat  120 mg Oral Daily  . feeding supplement (ENSURE ENLIVE)  237 mL Oral BID BM  . guaiFENesin  600 mg Oral BID  . insulin aspart  0-24 Units Subcutaneous Q4H  . insulin detemir  15 Units Subcutaneous Daily  . isosorbide-hydrALAZINE  1 tablet Oral TID  . pantoprazole  40 mg Oral Daily  . sodium chloride flush  10-40 mL Intracatheter Q12H  . sodium chloride flush  10-40 mL Intracatheter Q12H  . spironolactone  12.5 mg Oral Daily  . Warfarin - Physician Dosing Inpatient   Does not apply q1800     Infusions: . sodium chloride Stopped (04/19/17 1200)  . sodium chloride Stopped (04/19/17 1200)  . sodium chloride Stopped (04/21/17 1500)  . sodium chloride    . sodium chloride    . lactated ringers    . lactated ringers 20 mL/hr at 04/22/17 2000     PRN Medications:  sodium chloride, hydrALAZINE, metoCLOPramide, morphine injection, neomycin-bacitracin-polymyxin, ondansetron (ZOFRAN) IV, oxyCODONE, phenol, simethicone, sodium chloride flush, sodium chloride flush, traMADol  Assessment/Plan/Discussion:    POD 6 s/p HVAD for ischemic cardiomyopathy with acute on chronic systolic heart failure, EF 15% with moderate RV dysfunction. Returned to OR for bleeding from raw mediastinal tissues. Hemodynamics stable but hypertensive.On Bidil tid which was increased. Pump speed increased this am by Dr. Shirlee Latch.   INR increased to 1.68. Coumadin today 5 mg today. Stage 3 CKD: creat decreased a little today.  Probably ATN from surgery.Gave a dose of lasix this am and on Aldactone.  DM: preop Hgb A1c 5.9. Glucose under good control. ContinueLevemir and SSI.  Chronic anemia felt to be due to chronic disease. Hgb stable  Gout: continue  previous meds.   Hx of arrhythmias preop: continuepo amio. ICD in place but will wait to turn on until next week.  Malnutrition: encourage po nutrition. He has pressure sore on right leg and buttock.  Ileus improving. Bowels working.  Hiccups: not much to do about that. Would not use thorazine at this time.  DC remaining drain.  Continue mobilization    I reviewed the LVAD parameters from today, and compared the results to the patient's prior recorded data. LVAD equipment check completed andis in good  working order. Back-up equipment present.    Length of Stay: [address]12  Payton DoughtyBryan K [facility]idBart[facility] 04/23/2017, 8:07 AM

## 2017-04-23 NOTE — Progress Notes (Signed)
Occupational Therapy Treatment Patient Details Name: James [name] MRN: [id number] DOB: [birth year] Today's Date: 04/23/2017    History of present illness 56 y.o. male  admitted on 04/10/2017 with significant PMH for gout, DMII, HTN, CKD, CAD S/P CABGx6 2012, chronic systolic heart failure and [employer] ICD. presents now s/p heatmate LVAD implant   OT comments  Pt seen by OT x 2.  O rings added to zippers on equipment bag to improve his ability to manipulate them due to deficits with [facility].  He was very nauseated at that time and unable to progress to OOB.  Returned later in afternoon.  Pt able to ambulate 236ft with min A +2 and mod A for sit to stand.  Pt feeling poorly this date, flat affect and minimal interaction.  VSS.    Follow Up Recommendations  Home health OT;Supervision/Assistance - 24 hour    Equipment Recommendations  3 in 1 bedside commode    Recommendations for Other Services      Precautions / Restrictions Precautions Precautions: Sternal Precaution Comments: LVAD (heartmate)       Mobility Bed Mobility Overal bed mobility: Needs Assistance Bed Mobility: Supine to Sit       Sit to supine: Mod assist   General bed mobility comments: assist to move LEs to EOB and to lift trunk    Transfers Overall transfer level: Needs assistance   Transfers: Sit to/from Stand;Stand Pivot Transfers Sit to Stand: Mod assist;+2 safety/equipment Stand pivot transfers: Min assist;+2 safety/equipment       General transfer comment: Pt requires mod  A to power up into standing, and min A for balance during transfer     Balance Overall balance assessment: Needs assistance Sitting-balance support: No upper extremity supported;Feet supported Sitting balance-Leahy Scale: Fair Sitting balance - Comments: able to maintain static sitting with min guard assist q   Standing balance support: Bilateral upper extremity supported;During functional activity Standing balance-Leahy Scale:  Poor Standing balance comment: reliant on bil. UE support                            ADL either performed or assessed with clinical judgement   ADL Overall ADL's : Needs assistance/impaired                         Toilet Transfer: Moderate assistance;+2 for safety/equipment;Ambulation;Comfort height toilet;RW Statistician Details (indicate cue type and reason): assist to power up to standing            General ADL Comments: Pt limited this date by nausea and feeling poorly      Vision       Perception     Praxis      Cognition Arousal/Alertness: Awake/alert Behavior During Therapy: Flat affect Overall Cognitive Status: Within Functional Limits for tasks assessed                                          Exercises     Shoulder Instructions       General Comments "o" rings added to zipper on Equipment bag to ease pt's maneuverability due to decreased FMC secondary to gouty/arthritic deformity.  Pt was able to much more easily manipulate zippers to open and close bag.  Pt required max A today to switch from [facility] to battery.  At that time, pt with increased nausea with dry heaves.  Deferred OOB activity due to this, and returned later in the afternnon to assist with OOB activity      Pertinent Vitals/ Pain       Pain Assessment: Faces Faces Pain Scale: Hurts little more Pain Location: generalized discomfort  Pain Descriptors / Indicators: Grimacing Pain Intervention(s): Monitored during session;Limited activity within patient's tolerance  Home Living                                          Prior Functioning/Environment              Frequency  Min 2X/week        Progress Toward Goals  OT Goals(current goals can now be found in the care plan section)  Progress towards OT goals: Progressing toward goals     Plan Discharge plan remains appropriate    Co-evaluation                  AM-PAC PT "6 Clicks" Daily Activity     Outcome Measure   Help from another person eating meals?: A Little Help from another person taking care of personal grooming?: A Lot Help from another person toileting, which includes using toliet, bedpan, or urinal?: A Lot Help from another person bathing (including washing, rinsing, drying)?: A Lot Help from another person to put on and taking off regular upper body clothing?: A Lot Help from another person to put on and taking off regular lower body clothing?: Total 6 Click Score: 12    End of Session    OT Visit Diagnosis: Unsteadiness on feet (R26.81)   Activity Tolerance Patient limited by fatigue   Patient Left in chair;with call bell/phone within reach;with nursing/sitter in room   Nurse Communication Mobility status        Time: 1336 - 1344 and 1537-1602  OT Time Calculation (min):  8 mins  And 25 min     Charges: OT General Charges $OT Visit: 2 Procedure OT Treatments $Therapeutic Activity:  23-37 mins $ Self Care 1-22 mins   Baileys HarborWendi Terrica Duecker, OTR/L [phone]    [name], Ysabela Keisler M 04/23/2017, 4:42 PM

## 2017-04-23 NOTE — Progress Notes (Addendum)
Dr. Tyrone SageGerhardt notified of near constant vomiting of fecal smelling emesis for over an hour. Orders for KUB and chest x-ray obtained and carried out. Patient made NPO and x-ray results called to Dr. Tyrone SageGerhardt. Orders received for NG tube placement and low intermittent suction. Patient tolerated well and 3 L of clay colored emesis almost immediately suctioned from patient.  Patient with increasing difficulty breathing. From room air to partial non-rebreather in roughly 4 hours, lungs clear to auscultation at beginning of shift now extremely congested and rhonchus. Patient breathing 40 times a minute. Patient back in a fib with rate control 100s-110s. Dr. Tyrone SageGerhardt paged and informed. Dr. Shirlee LatchMclean paged and informed. 60 mg Lasix ordred and foley reinserted. Critical care consulted. Patient intubated. Levophed, Neo, and a-line started per critical care. Amio restarted per Dr. Shirlee LatchMclean due to a.fib with rate up to the 160s at times. All services and wife updated.

## 2017-04-23 NOTE — Progress Notes (Signed)
Patient ambulated in hallway 295 ft, patient back to bed, call bell in reach, foley and CT DC'd per MD order and protocol, worked with patient on IS-500 achieved-stressed the importance of working with IS 10X every hr awake-will continue to work on this with him, patient resting, will continue to monitor.  Hermina BartersBOWMAN, Kisa Fujii M, RN

## 2017-04-24 ENCOUNTER — Inpatient Hospital Stay (HOSPITAL_COMMUNITY): Payer: Medicare PPO

## 2017-04-24 LAB — LACTIC ACID, PLASMA
Lactic Acid, Venous: 2.5 mmol/L (ref 0.5–1.9)
Lactic Acid, Venous: 1.5 mmol/L (ref 0.5–1.9)

## 2017-04-24 LAB — POCT I-STAT, CHEM 8
BUN: 56 mg/dL — AB (ref 6–20)
CHLORIDE: 99 mmol/L — AB (ref 101–111)
CREATININE: 2.6 mg/dL — AB (ref 0.61–1.24)
Calcium, Ion: 1.08 mmol/L — ABNORMAL LOW (ref 1.15–1.40)
Glucose, Bld: 119 mg/dL — ABNORMAL HIGH (ref 65–99)
HEMATOCRIT: 30 % — AB (ref 39.0–52.0)
HEMOGLOBIN: 10.2 g/dL — AB (ref 13.0–17.0)
POTASSIUM: 4.3 mmol/L (ref 3.5–5.1)
Sodium: 134 mmol/L — ABNORMAL LOW (ref 135–145)
TCO2: 23 mmol/L (ref 0–100)

## 2017-04-24 LAB — COMPREHENSIVE METABOLIC PANEL
ALBUMIN: 2.3 g/dL — AB (ref 3.5–5.0)
ALK PHOS: 86 U/L (ref 38–126)
ALT: 11 U/L — ABNORMAL LOW (ref 17–63)
ALT: 9 U/L — AB (ref 17–63)
AST: 24 U/L (ref 15–41)
AST: 22 U/L (ref 15–41)
Albumin: 2.5 g/dL — ABNORMAL LOW (ref 3.5–5.0)
Alkaline Phosphatase: 88 U/L (ref 38–126)
Anion gap: 10 (ref 5–15)
Anion gap: 9 (ref 5–15)
BILIRUBIN TOTAL: 1.1 mg/dL (ref 0.3–1.2)
BUN: 59 mg/dL — ABNORMAL HIGH (ref 6–20)
BUN: 59 mg/dL — AB (ref 6–20)
CALCIUM: 7.8 mg/dL — AB (ref 8.9–10.3)
CO2: 23 mmol/L (ref 22–32)
CO2: 23 mmol/L (ref 22–32)
CREATININE: 2.6 mg/dL — AB (ref 0.61–1.24)
Calcium: 8.2 mg/dL — ABNORMAL LOW (ref 8.9–10.3)
Chloride: 99 mmol/L — ABNORMAL LOW (ref 101–111)
Chloride: 101 mmol/L (ref 101–111)
Creatinine, Ser: 2.68 mg/dL — ABNORMAL HIGH (ref 0.61–1.24)
GFR calc Af Amer: 29 mL/min — ABNORMAL LOW (ref >60)
GFR calc Af Amer: 30 mL/min — ABNORMAL LOW (ref >60)
GFR calc non Af Amer: 25 mL/min — ABNORMAL LOW (ref >60)
GFR, EST NON AFRICAN AMERICAN: 26 mL/min — AB (ref >60)
GLUCOSE: 98 mg/dL (ref 65–99)
Glucose, Bld: 68 mg/dL (ref 65–99)
Potassium: 4.7 mmol/L (ref 3.5–5.1)
Potassium: 4.6 mmol/L (ref 3.5–5.1)
Sodium: 132 mmol/L — ABNORMAL LOW (ref 135–145)
Sodium: 133 mmol/L — ABNORMAL LOW (ref 135–145)
TOTAL PROTEIN: 5.1 g/dL — AB (ref 6.5–8.1)
Total Bilirubin: 1.2 mg/dL (ref 0.3–1.2)
Total Protein: 5.9 g/dL — ABNORMAL LOW (ref 6.5–8.1)

## 2017-04-24 LAB — COOXEMETRY PANEL
CARBOXYHEMOGLOBIN: 1 % (ref 0.5–1.5)
Methemoglobin: 1.4 % (ref 0.0–1.5)
O2 Saturation: 78.7 %
Total hemoglobin: 10.8 g/dL — ABNORMAL LOW (ref 12.0–16.0)

## 2017-04-24 LAB — CBC WITH DIFFERENTIAL/PLATELET
BASOS PCT: 0 %
Basophils Absolute: 0 10*3/uL (ref 0.0–0.1)
Eosinophils Absolute: 0 10*3/uL (ref 0.0–0.7)
Eosinophils Relative: 0 %
HEMATOCRIT: 31.9 % — AB (ref 39.0–52.0)
HEMOGLOBIN: 10.1 g/dL — AB (ref 13.0–17.0)
LYMPHS ABS: 0.5 10*3/uL — AB (ref 0.7–4.0)
Lymphocytes Relative: 4 %
MCH: 28.4 pg (ref 26.0–34.0)
MCHC: 31.7 g/dL (ref 30.0–36.0)
MCV: 89.6 fL (ref 78.0–100.0)
MONOS PCT: 2 %
Monocytes Absolute: 0.2 10*3/uL (ref 0.1–1.0)
NEUTROS ABS: 11.6 10*3/uL — AB (ref 1.7–7.7)
NEUTROS PCT: 94 %
Platelets: 303 10*3/uL (ref 150–400)
RBC: 3.56 MIL/uL — ABNORMAL LOW (ref 4.22–5.81)
RDW: 18.2 % — ABNORMAL HIGH (ref 11.5–15.5)
WBC: 12.4 10*3/uL — ABNORMAL HIGH (ref 4.0–10.5)

## 2017-04-24 LAB — POCT I-STAT 3, ART BLOOD GAS (G3+)
Acid-Base Excess: 1 mmol/L (ref 0.0–2.0)
Acid-Base Excess: 1 mmol/L (ref 0.0–2.0)
BICARBONATE: 25.3 mmol/L (ref 20.0–28.0)
BICARBONATE: 23.5 mmol/L (ref 20.0–28.0)
BICARBONATE: 23.6 mmol/L (ref 20.0–28.0)
O2 Saturation: 100 %
O2 Saturation: 99 %
O2 Saturation: 99 %
PCO2 ART: 30.3 mmHg — AB (ref 32.0–48.0)
PCO2 ART: 31.7 mmHg — AB (ref 32.0–48.0)
PH ART: 7.479 — AB (ref 7.350–7.450)
PO2 ART: 198 mmHg — AB (ref 83.0–108.0)
PO2 ART: 135 mmHg — AB (ref 83.0–108.0)
Patient temperature: 98
TCO2: 26 mmol/L (ref 0–100)
TCO2: 24 mmol/L (ref 0–100)
TCO2: 25 mmol/L (ref 0–100)
pCO2 arterial: 37.6 mmHg (ref 32.0–48.0)
pH, Arterial: 7.434 (ref 7.350–7.450)
pH, Arterial: 7.496 — ABNORMAL HIGH (ref 7.350–7.450)
pO2, Arterial: 115 mmHg — ABNORMAL HIGH (ref 83.0–108.0)

## 2017-04-24 LAB — TYPE AND SCREEN
ABO/RH(D): A POS
Antibody Screen: NEGATIVE

## 2017-04-24 LAB — URINALYSIS, ROUTINE W REFLEX MICROSCOPIC
BILIRUBIN URINE: NEGATIVE
Glucose, UA: NEGATIVE mg/dL
Hgb urine dipstick: NEGATIVE
KETONES UR: NEGATIVE mg/dL
Nitrite: NEGATIVE
PROTEIN: 30 mg/dL — AB
Specific Gravity, Urine: 1.021 (ref 1.005–1.030)
pH: 5 (ref 5.0–8.0)

## 2017-04-24 LAB — GLUCOSE, CAPILLARY
GLUCOSE-CAPILLARY: 112 mg/dL — AB (ref 65–99)
GLUCOSE-CAPILLARY: 69 mg/dL (ref 65–99)
Glucose-Capillary: 111 mg/dL — ABNORMAL HIGH (ref 65–99)
Glucose-Capillary: 72 mg/dL (ref 65–99)
Glucose-Capillary: 77 mg/dL (ref 65–99)
Glucose-Capillary: 106 mg/dL — ABNORMAL HIGH (ref 65–99)

## 2017-04-24 LAB — PHOSPHORUS: PHOSPHORUS: 4 mg/dL (ref 2.5–4.6)

## 2017-04-24 LAB — LACTATE DEHYDROGENASE: LDH: 217 U/L — ABNORMAL HIGH (ref 98–192)

## 2017-04-24 LAB — PROTIME-INR
INR: 2.61
PROTHROMBIN TIME: 28.4 s — AB (ref 11.4–15.2)

## 2017-04-24 LAB — PROCALCITONIN: PROCALCITONIN: 1.96 ng/mL

## 2017-04-24 LAB — MAGNESIUM: Magnesium: 1.8 mg/dL (ref 1.7–2.4)

## 2017-04-24 LAB — APTT: aPTT: 51 seconds — ABNORMAL HIGH (ref 24–36)

## 2017-04-24 LAB — TROPONIN I: TROPONIN I: 2.11 ng/mL — AB (ref <0.03)

## 2017-04-24 LAB — BRAIN NATRIURETIC PEPTIDE: B NATRIURETIC PEPTIDE 5: 705.3 pg/mL — AB (ref 0.0–100.0)

## 2017-04-24 LAB — CORTISOL: CORTISOL PLASMA: 30.8 ug/dL

## 2017-04-24 MED ORDER — ALBUMIN HUMAN 25 % IV SOLN
12.5000 g | Freq: Four times a day (QID) | INTRAVENOUS | Status: AC
  Administered 2017-04-24 – 2017-04-25 (×2): 12.5 g via INTRAVENOUS
  Filled 2017-04-24 (×2): qty 50

## 2017-04-24 MED ORDER — VANCOMYCIN HCL 10 G IV SOLR
2000.0000 mg | Freq: Once | INTRAVENOUS | Status: AC
  Administered 2017-04-24: 2000 mg via INTRAVENOUS
  Filled 2017-04-24: qty 2000

## 2017-04-24 MED ORDER — ALBUTEROL SULFATE (2.5 MG/3ML) 0.083% IN NEBU: 2.5000 mg | INHALATION_SOLUTION | RESPIRATORY_TRACT | Status: DC | PRN

## 2017-04-24 MED ORDER — SODIUM CHLORIDE 0.9 % IV BOLUS (SEPSIS)
1000.0000 mL | Freq: Once | INTRAVENOUS | Status: AC
  Administered 2017-04-24: 1000 mL via INTRAVENOUS

## 2017-04-24 MED ORDER — PIPERACILLIN-TAZOBACTAM 3.375 G IVPB
3.3750 g | Freq: Three times a day (TID) | INTRAVENOUS | Status: AC
  Administered 2017-04-24 – 2017-05-01 (×24): 3.375 g via INTRAVENOUS
  Filled 2017-04-24 (×24): qty 50

## 2017-04-24 MED ORDER — SUCCINYLCHOLINE CHLORIDE 20 MG/ML IJ SOLN
100.0000 mg | Freq: Once | INTRAMUSCULAR | Status: AC
  Administered 2017-04-24: 100 mg via INTRAVENOUS
  Filled 2017-04-24: qty 5

## 2017-04-24 MED ORDER — ORAL CARE MOUTH RINSE
15.0000 mL | Freq: Four times a day (QID) | OROMUCOSAL | Status: DC
  Administered 2017-04-24 – 2017-04-27 (×13): 15 mL via OROMUCOSAL

## 2017-04-24 MED ORDER — FENTANYL 2500MCG IN NS 250ML (10MCG/ML) PREMIX INFUSION
25.0000 ug/h | INTRAVENOUS | Status: DC
  Administered 2017-04-24: 25 ug/h via INTRAVENOUS
  Filled 2017-04-24: qty 250

## 2017-04-24 MED ORDER — FENTANYL CITRATE (PF) 100 MCG/2ML IJ SOLN: 50.0000 ug | Freq: Once | INTRAMUSCULAR | Status: DC

## 2017-04-24 MED ORDER — MIDAZOLAM HCL 2 MG/2ML IJ SOLN: 2.0000 mg | INTRAMUSCULAR | Status: DC | PRN

## 2017-04-24 MED ORDER — AMIODARONE HCL IN DEXTROSE 360-4.14 MG/200ML-% IV SOLN
30.0000 mg/h | INTRAVENOUS | Status: DC
  Administered 2017-04-24 – 2017-04-28 (×10): 30 mg/h via INTRAVENOUS
  Filled 2017-04-24 (×10): qty 200

## 2017-04-24 MED ORDER — DEXTROSE 50 % IV SOLN
INTRAVENOUS | Status: AC
  Administered 2017-04-24: 50 mL
  Filled 2017-04-24: qty 50

## 2017-04-24 MED ORDER — TRACE MINERALS CR-CU-MN-SE-ZN 10-1000-500-60 MCG/ML IV SOLN
INTRAVENOUS | Status: AC
  Administered 2017-04-24: 19:00:00 via INTRAVENOUS
  Filled 2017-04-24: qty 960

## 2017-04-24 MED ORDER — FAMOTIDINE IN NACL 20-0.9 MG/50ML-% IV SOLN: 20.0000 mg | INTRAVENOUS | Status: DC

## 2017-04-24 MED ORDER — ETOMIDATE 2 MG/ML IV SOLN
30.0000 mg | Freq: Once | INTRAVENOUS | Status: AC
Start: 2017-04-24 — End: 2017-04-24
  Administered 2017-04-24: 30 mg via INTRAVENOUS

## 2017-04-24 MED ORDER — CHLORHEXIDINE GLUCONATE 0.12% ORAL RINSE (MEDLINE KIT)
15.0000 mL | Freq: Two times a day (BID) | OROMUCOSAL | Status: DC
  Administered 2017-04-24 – 2017-04-26 (×6): 15 mL via OROMUCOSAL

## 2017-04-24 MED ORDER — AMIODARONE LOAD VIA INFUSION
150.0000 mg | Freq: Once | INTRAVENOUS | Status: AC
  Administered 2017-04-24: 150 mg via INTRAVENOUS
  Filled 2017-04-24: qty 83.34

## 2017-04-24 MED ORDER — MIDAZOLAM HCL 2 MG/2ML IJ SOLN
INTRAMUSCULAR | Status: AC
  Administered 2017-04-24: 2 mg
  Filled 2017-04-24: qty 2

## 2017-04-24 MED ORDER — FUROSEMIDE 10 MG/ML IJ SOLN
60.0000 mg | Freq: Once | INTRAMUSCULAR | Status: AC
  Administered 2017-04-24: 60 mg via INTRAVENOUS
  Filled 2017-04-24: qty 6

## 2017-04-24 MED ORDER — FENTANYL BOLUS VIA INFUSION
50.0000 ug | INTRAVENOUS | Status: DC | PRN
  Filled 2017-04-24: qty 50

## 2017-04-24 MED ORDER — AMIODARONE HCL IN DEXTROSE 360-4.14 MG/200ML-% IV SOLN
60.0000 mg/h | INTRAVENOUS | Status: AC
  Administered 2017-04-24 (×2): 60 mg/h via INTRAVENOUS
  Filled 2017-04-24 (×2): qty 200

## 2017-04-24 MED ORDER — NOREPINEPHRINE BITARTRATE 1 MG/ML IV SOLN
5.0000 ug/min | INTRAVENOUS | Status: DC
  Administered 2017-04-24: 8 ug/min via INTRAVENOUS
  Administered 2017-04-25: 7 ug/min via INTRAVENOUS
  Administered 2017-04-25: 4 ug/min via INTRAVENOUS
  Filled 2017-04-24 (×5): qty 4

## 2017-04-24 MED ORDER — VANCOMYCIN HCL 10 G IV SOLR
1250.0000 mg | INTRAVENOUS | Status: DC
  Administered 2017-04-25 – 2017-04-26 (×2): 1250 mg via INTRAVENOUS
  Filled 2017-04-24 (×3): qty 1250

## 2017-04-24 MED ORDER — SODIUM CHLORIDE 0.9 % IV SOLN
0.0000 ug/min | INTRAVENOUS | Status: DC
  Administered 2017-04-24: 200 ug/min via INTRAVENOUS
  Administered 2017-04-24: 25 ug/min via INTRAVENOUS
  Administered 2017-04-24: 30 ug/min via INTRAVENOUS
  Filled 2017-04-24 (×6): qty 4

## 2017-04-24 MED ORDER — DOCUSATE SODIUM 50 MG/5ML PO LIQD: 100.0000 mg | Freq: Two times a day (BID) | ORAL | Status: DC | PRN

## 2017-04-24 NOTE — Procedures (Signed)
Endotracheal Intubation Procedure Note Indication for endotracheal intubation: respiratory failure Sedation: etomidate and midazolam Paralytic: succinylcholine Equipment: Macintosh 3 laryngoscope blade and 8.400mm cuffed endotracheal tube Cricoid Pressure: yes Number of attempts: 1 ETT location confirmed by by auscultation, by CXR and ETCO2 monitor.   Patient with hypoxic respiratory failure, severe respiratory distress, and worsening shock. Decision made to emergently intubate.

## 2017-04-24 NOTE — Progress Notes (Signed)
CSW visited bedside. Patient had a difficult night last night which required intubation. RN reports wife and family aware and wife will be in later this afternoon. CSW will continue to follow for support throughout implant hospitalization. Lasandra BeechJackie Caylin Raby, LCSW, CCSW-MCS [phone]

## 2017-04-24 NOTE — Consult Note (Signed)
PULMONARY / CRITICAL CARE MEDICINE   Name: James [name] MRN: [id number] DOB: [birth year]    ADMISSION DATE:  04/01/2017 CONSULTATION DATE: 04/24/17  REFERRING MD: Dr Laneta Simmers  CHIEF COMPLAINT: Respiratory distress  HISTORY OF PRESENT ILLNESS:   [name] with CHF (EF 15%), AICD, CAD, Gout, HTN, CKD, DM, Anemia, admitted 04/10/2017 for VAD placement, as he had already been turned down by [facility] for transplant due to his gout and limitations with his mobility. Today patient is POD 7 s/p HVAD placement for his ischemic cardiomyopathy. His course has been complicated by AKI-on-CKD, and also complicated by pre-op Afib with RVR. Earlier this evening he began vomiting feculent material and was found to have an ileus. An NG tube was placed to suction. However, patient then developed acute hypoxic respiratory failure requiring 15L O2 NRB, and he developed worsening respiratory distress, prompting ICU consult.   When I first examined patient, his RR was 40 and lung auscultation revealed rhonchi. He appeared very weak and eyes rolling back in his head. Automated BP wouldn't pick up; manual SBP 80. RT attempted ABG but was unable. Out of concern for impending respiratory failure and cardiac arrest, decision was made to go ahead and intubate.   PAST MEDICAL HISTORY :  He  has a past medical history of AICD (automatic cardioverter/defibrillator) present (09/2015); CHF (congestive heart failure) (HCC); Chronic edema; CMI (chronic mesenteric ischemia) (HCC); Coronary artery disease; Dyspnea; Gout; Gout; Hypertension; Ischemic cardiomyopathy; Myocardial infarction (HCC); Obesity; Renal insufficiency; and Type II diabetes mellitus (HCC).  PAST SURGICAL HISTORY: He  has a past surgical history that includes Cardiac catheterization (N/A, 10/06/2015); Shoulder open rotator cuff repair (Right, ~ 2000); Coronary artery bypass graft (11/2010); Cardiac catheterization (11/2010); Cardiac catheterization (N/A, 07/15/2016); Right Heart Cath  (N/A, 11/27/2016); Right Heart Cath (N/A, 04/12/2017); Right Heart Cath (N/A, 03/27/2017); Insertion of implantable left ventricular assist device (N/A, 03/26/2017); and TEE without cardioversion (N/A, 04/17/2017).  No Active Allergies  No current facility-administered medications on file prior to encounter.    Current Outpatient Prescriptions on File Prior to Encounter  Medication Sig  . acetaminophen (TYLENOL) 500 MG tablet Take 500-1,000 mg by mouth every 4 (four) hours as needed for headache (pain).  Marland Kitchen aspirin EC 81 MG tablet Take 81 mg by mouth daily.  Marland Kitchen atorvastatin (LIPITOR) 80 MG tablet TAKE ONE TABLET BY MOUTH DAILY  . colchicine 0.6 MG tablet Take 1 tablet (0.6 mg total) by mouth daily. (Patient taking differently: Take 0.6 mg by mouth daily as needed (gout). )  . digoxin (LANOXIN) 0.125 MG tablet Take 0.5 tablets (0.0625 mg total) by mouth daily.  . febuxostat (ULORIC) 40 MG tablet Take 120 mg by mouth daily.  Marland Kitchen glipiZIDE (GLUCOTROL) 5 MG tablet TAKE 1 TABLET (5 MG TOTAL) BY MOUTH 2 (TWO) TIMES DAILY BEFORE MEALS (REFER FUTURE REFILLS TO PRIMARY CARE PHYSICIAN)  . isosorbide-hydrALAZINE (BIDIL) 20-37.5 MG tablet Take 1.5 tablets by mouth 3 (three) times daily.  . nitroGLYCERIN (NITROSTAT) 0.4 MG SL tablet Place 1 tablet (0.4 mg total) under the tongue every 5 (five) minutes as needed for chest pain.  Marland Kitchen torsemide (DEMADEX) 20 MG tablet Take 2 tablets (40 mg total) by mouth daily.   FAMILY HISTORY:  His indicated that his mother is alive. He indicated that his father is deceased. He indicated that his sister is alive. He indicated that his maternal grandmother is deceased. He indicated that his maternal grandfather is deceased. He indicated that his paternal grandmother is deceased. He indicated that  his paternal grandfather is deceased. He indicated that the status of his other is unknown.   SOCIAL HISTORY: He  reports that he has never smoked. He has quit using smokeless tobacco. His  smokeless tobacco use included Chew. He reports that he drinks alcohol. He reports that he does not use drugs.  REVIEW OF SYSTEMS:   Review of Systems  Unable to perform ROS: Critical illness   VITAL SIGNS: BP 106/79 (BP Location: Left Arm)   Pulse 95   Temp 98.4 F (36.9 C) (Oral)   Resp 20   Ht 6' (1.829 m)   Wt 96.7 kg (213 lb 3 oz)   SpO2 100%   BMI 28.91 kg/m   HEMODYNAMICS: CVP:  [7 mmHg-19 mmHg] 19 mmHg  VENTILATOR SETTINGS: Vent Mode: PRVC FiO2 (%):  [100 %] 100 % Set Rate:  [20 bmp] 20 bmp Vt Set:  [562 mL] 620 mL PEEP:  [5 cmH20] 5 cmH20 Plateau Pressure:  [28 cmH20] 28 cmH20  INTAKE / OUTPUT: I/O last 3 completed shifts: In: 1454.4 [P.O.:720; I.V.:734.4] Out: 1725 [Urine:1625; Chest Tube:100]  PHYSICAL EXAMINATION: General: Middle age AAM appears older than his stated age, cachectic, in severe distress, critically ill Neuro: somnolent, awakens to loud voice but eyes rolling back in his head HEENT: OP clear, MM moist, NG tube to suction draining feculent material Cardiovascular: Irreg Irreg with HR 130's; Loud mechanical sounds from HVAD; HVAD wires and tube present Lungs: Rhonchi b/l, + Accessory muscle use, + speaking in one word sentences, +severe respiratory distress Abdomen: soft NTND Musculoskeletal: 1+ BLE edema; tophi on left finger PIP Skin: no rashes  LABS:  BMET  Recent Labs Lab 04/22/17 0339 04/23/17 0414 04/23/17 2225  NA 132* 133* 132*  K 3.8 3.9 4.7  CL 100* 100* 99*  CO2 24 24 23   BUN 48* 49* 59*  CREATININE 2.22* 2.09* 2.68*  GLUCOSE 120* 104* 68    Electrolytes  Recent Labs Lab 04/21/17 0334 04/22/17 0339 04/23/17 0414 04/23/17 2225  CALCIUM 8.3* 8.1* 8.2* 8.2*  MG 2.1 2.0 2.0  --   PHOS 4.3 3.6 3.4  --     CBC  Recent Labs Lab 04/21/17 0334 04/22/17 0339 04/23/17 0414  WBC 17.7* 14.8* 11.2*  HGB 10.0* 9.6* 9.9*  HCT 30.4* 30.1* 31.0*  PLT 204 247 303   Coag's  Recent Labs Lab 04/13/2017 0451  03/24/2017 1638 04/18/17 0349  04/21/17 0334 04/22/17 0339 04/23/17 0414  APTT 39* 52* 39*  --   --   --   --   INR  --  1.46  --   < > 1.25 1.33 1.68  < > = values in this interval not displayed.  Sepsis Markers No results for input(s): LATICACIDVEN, PROCALCITON, O2SATVEN in the last 168 hours.  ABG  Recent Labs Lab 04/20/17 0347 04/21/17 0410 04/24/17 0236  PHART 7.422 7.431 7.434  PCO2ART 39.9 38.1 37.6  PO2ART 57.0* 80.9* 198.0*    Liver Enzymes  Recent Labs Lab 04/22/17 0339 04/23/17 0414 04/23/17 2225  AST 20 20 24   ALT 9* 8* 11*  ALKPHOS 90 95 88  BILITOT 1.5* 1.2 1.2  ALBUMIN 2.4* 2.4* 2.5*   Cardiac Enzymes No results for input(s): TROPONINI, PROBNP in the last 168 hours.  Glucose  Recent Labs Lab 04/23/17 1245 04/23/17 1633 04/23/17 1920 04/23/17 2333 04/23/17 2353 04/24/17 0125  GLUCAP 112* 98 80 50* 112* 69   Imaging Dg Chest Port 1 View  Result Date: 04/23/2017 CLINICAL DATA:  Vomiting. EXAM: PORTABLE CHEST 1 VIEW COMPARISON:  Chest radiographs 04/21/2017 FINDINGS: Previous right internal jugular sheath has been removed. Right upper extremity PICC is unchanged, tip at the atrial caval junction. Left ventricular assist device again seen. Left-sided pacemaker again seen. Very low lung volumes. Post median sternotomy. Grossly stable cardiomegaly. Linear left perihilar opacity is likely atelectasis. Increased right basilar atelectasis. No pneumothorax. No convincing pulmonary edema. IMPRESSION: 1. Very low lung volumes with bibasilar atelectasis. Stable cardiomegaly. Left ventricular assist device again seen. 2. Tip of the right upper extremity PICC at the atrial caval junction. Electronically Signed   By: Rubye Oaks M.D.   On: 04/23/2017 21:16   Dg Abd Portable 1v  Result Date: 04/23/2017 CLINICAL DATA:  Vomiting. EXAM: PORTABLE ABDOMEN - 1 VIEW COMPARISON:  Radiographs 04/18/2017, CTA 04/12/2017 FINDINGS: Marked gaseous gastric distension.  Dilated small bowel throughout the abdomen measures up to 4.9 cm. No evidence of free air. Multiple overlying monitoring devices. Vascular calcifications. Left ventricular assist device partially included. IMPRESSION: Marked gaseous gastric distension nondilated small bowel, small bowel obstruction versus severe ileus. Electronically Signed   By: Rubye Oaks M.D.   On: 04/23/2017 21:18   LINES/TUBES: RUE PICC (7/2) Foley catheter (7/5) ETT (7/5  ASSESSMENT / PLAN: [name] with CHF (EF 15%), AICD, CAD, Gout, HTN, CKD, DM, Anemia, admitted 03/26/2017 for VAD placement, now POD 7 s/p HVAD placement, with course complicated by AKI-on-CKD and pre-op Afib with RVR. Overnight he developed feculent vomiting related to an ileus. And now he developed acute hypoxic respiratory failure with severe respiratory distress due to aspiration pneumonia, requiring intubation, with associated development of septic shock.  PULMONARY 1. Acute hypoxic respiratory failure; Aspiration pneumonia: - intubated (see separate procedure note); post-intubation ABG was acceptable - continue GI and DVT prophylaxis - NPO; NG tube to suction - obtain sputum culture, start antibiotics (see discussion below) - fentanyl gtt; versed PRN for sedation - CXR on my review shows new RML and LLL infiltrates, which could be due to pulmonary edema or pneumonia. But in the setting of the new shock and profuse vomiting, this is most likely related to aspiration pneumonia.  CARDIOVASCULAR 1. CHF (EF 15%); CAD; History HTN - continue HVAD; CT Surg and Cards to manage - holding antihypertensive medications in setting of shock  2. Afib with RVR: - went from HR 100 to HR 130's with initiation of levophed, which is likely driving the tachycardia - will start phenylephrine and try to wean down levophed - restart Amiodarone gtt - check electrolytes now  RENAL 1. AKI-on-CKD: - creatinine now 2.68, up from most recent baseline of 1.5-1.6 (but  sometimes up to 2.19 in the past) - thought due to ATN from shock earlier this hospitalization - placed foley catheter; check UA; monitor UOP closely  GASTROINTESTINAL 1. Ileus vs SBO: - seen on KUB - NG tube now down and to suction - NPO - repeat KUB daily; consider CT Abdomen  HEMATOLOGIC 1. Anemia: - Hgb currently 10; has baseline in 7-8's range - unclear etiology; no clinical blood loss noted on my exam. Continue to monitor.  INFECTIOUS 1. Septic Shock: - new shock in setting of ileus with vomiting and aspiration, most likely septic shock - pan-culture, check lactate and procalcitonin - check cortisol - give 2L NS bolus - start Vanc and Zosyn - currently on levophed @ but is causing Afib with RVR; will start phenylephrine and try to wean down the levophed  ENDOCRINE 1. DM: - currently NPO;  continue accuchecks and SSI  NEUROLOGIC 1. Acute encephalopathy: due to acute illness - titrate sedation for RASS -1   FAMILY  - Updates: RN to call patient's family   90 minutes nonprocedural critical care time  Milana ObeyKathleen Hammonds, MD Pulmonary and Critical Care Medicine [facility] Pager: [phone]  04/24/2017, 2:38 AM

## 2017-04-24 NOTE — Progress Notes (Signed)
Nutrition Follow-up  DOCUMENTATION CODES:    Severe malnutrition in context of chronic illness  INTERVENTION:    TPN to meet nutrition needs  NUTRITION DIAGNOSIS:   Malnutrition (severe) related to chronic illness (CHF) as evidenced by severe depletion of body fat, severe depletion of muscle mass.  Ongoing  GOAL:   Patient will meet greater than or equal to 90% of their needs  Unmet  MONITOR:   Vent status, Labs, I & O's, Skin  REASON FOR ASSESSMENT:   Consult New TPN/TNA  ASSESSMENT:   56 yo male with hx of HTN, gout, obesity, chronic edema, CAD, ischemic cardiomyopathy, DM2, AICD, MI, CHF, renal insufficiency who was admitted on 6/22 with acute on chronic systolic heart failure.  6/28 HVAD placement 6/29 extubated 7/1 soft diet started, Ensure Enlive added  Patient required re-intubation last night. Found to have ileus after vomiting fecal material. NGT placed and 3 L were suctioned.  TPN being initiated today.   Patient is currently intubated on ventilator support MV: 12.8 L/min Temp (24hrs), Avg:98.3 F (36.8 C), Min:97.2 F (36.2 C), Max:100.6 F (38.1 C)   Labs reviewed: sodium 133 (L) Medications reviewed. Weight up to 208 lbs today, I/O -6 L since admission  Diet Order:  Diet NPO time specified  Skin:  Wound (see comment) (open wound R leg; boil R buttocks)  Last BM:  7/4  Height:   Ht Readings from Last 1 Encounters:  04/22/17 6' (1.829 m)    Weight:   Wt Readings from Last 1 Encounters:  04/24/17 208 lb 15.9 oz (94.8 kg)   Admission weight 185 lbs 12.8 oz (84.3 kg)  Ideal Body Weight:  80.9 kg  BMI:  Body mass index is 28.34 kg/m.  Estimated Nutritional Needs:   Kcal:  2200  Protein:  120-140 gm  Fluid:  2-2.2 L  EDUCATION NEEDS:   No education needs identified at this time  Joaquin CourtsKimberly Harris, RD, LDN, CNSC Pager [phone] After Hours Pager [phone]

## 2017-04-24 NOTE — Progress Notes (Addendum)
Pharmacy Antibiotic Note  James [name] is a 56 y.o. male admitted on 03/27/2017 for VAD placement on 6/28, now POD #7 now with respiratory distress and suspected sepsis.  Pharmacy has been consulted for vancomycin and zosyn dosing. Temperature 100.6, WBC elevated at 12.4, and LA 2.5. SCr increasing, with last 2.68 for estimated CrCl ~ 35-40 mL/min. Patient is s/p abx this admission for surgical prophylaxis.   Plan: Vancomycin 2g IV x1, then 1250 mg IV q24hr  Zosyn 3.375g IV q8hr [facility] and as needed (goal 15-20 mcg/mL) Monitor renal function, clinical picture, and culture data F/u length of therapy    Height: 6' (182.9 cm) Weight: 213 lb 3 oz (96.7 kg) IBW/kg (Calculated) : 77.6  Temp (24hrs), Avg:98.5 F (36.9 C), Min:97.3 F (36.3 C), Max:100.6 F (38.1 C)   Recent Labs Lab 04/20/17 0323 04/21/17 0334 04/22/17 0339 04/23/17 0414 04/23/17 2225 04/24/17 0238  WBC 14.6* 17.7* 14.8* 11.2*  --  12.4*  CREATININE 2.10* 2.31* 2.22* 2.09* 2.68*  --     Estimated Creatinine Clearance: 38 mL/min (A) (by C-G formula based on SCr of 2.68 mg/dL (H)).    No Active Allergies  Antimicrobials this admission: 6/28 Cefuroxime > 6/30 6/28 Fluconazole > 6/30  6/28 Rifampin > 6/30 6/28 Vanc > 6/30, 7/5 >>  7/7 Zosyn >>   Dose adjustments this admission: n/a   Microbiology results: 6/22 MRSA PCR - positive   York CeriseKatherine Cook, PharmD Clinical Pharmacist 04/24/17 3:38 AM

## 2017-04-24 NOTE — Progress Notes (Signed)
Inpatient Diabetes Program Recommendations  AACE/ADA: New Consensus Statement on Inpatient Glycemic Control (2015)  Target Ranges:  Prepandial:   less than 140 mg/dL      Peak postprandial:   less than 180 mg/dL (1-2 hours)      Critically ill patients:  140 - 180 mg/dL   Lab Results  Component Value Date   GLUCAP 72 04/24/2017   HGBA1C 5.9 (H) 03/31/2017    Review of Glycemic Control:  Results for James [name], Pratik D (MRN [id number]) as of 04/24/2017 10:25  Ref. Range 04/23/2017 12:45 04/23/2017 16:33 04/23/2017 19:20 04/23/2017 23:33 04/23/2017 23:53 04/24/2017 01:25 04/24/2017 02:15 04/24/2017 07:47  Glucose-Capillary Latest Ref Range: 65 - 99 mg/dL 409112 (H) 98 80 50 (L) 811112 (H) 69 111 (H) 72   Diabetes history: Type 2 diabetes Outpatient Diabetes medications: Glucotrol 5 mg bid Current orders for Inpatient glycemic control:  Novolog TCTS q 4 hours  Inpatient Diabetes Program Recommendations:   Blood sugars decreased.  Consider holding Novolog correction until CBG's>140 mg/dL and then reduce Novolog correction to sensitive q 4 hours.  Thanks, Beryl MeagerJenny Kalel Harty, RN, BC-ADM Inpatient Diabetes Coordinator Pager [phone] (8a-5p)

## 2017-04-24 NOTE — Progress Notes (Addendum)
Patient ID: James [name], male   DOB: Mar 08, 1961, 56 y.o.   MRN: [id number]  HVAD Rounding Note  Subjective:    He developed nausea and vomiting yesterday evening and KUB suggested ileus. He developed respiratory failure probably secondary to aspiration and had to be intubated. An NG tube put out 3 L of brown liquid that looked like liquid stool. He developed atrial fib with RVR and was started on IV amio. He became hypotensive and required high dose neo and levophed. He did not have a metabolic acidosis by blood gas and Co-ox was 78.7. No pump problems. His lactic acid was initially 2.5 but decreased to 1.5 a few hrs later.  Over the course of the day he has been very stable and neo weaned off and down to 2 mcg levophed.   LVAD INTERROGATION:  HVAD:  Flow 4.8 liters/min, speed 2820, power 4.9,   Objective:    Vital Signs:   Temp:  [97.2 F (36.2 C)-100.6 F (38.1 C)] 97.2 F (36.2 C) (07/05 0748) Pulse Rate:  [32-135] 85 (07/05 1700) Resp:  [14-41] 15 (07/05 1700) BP: (63-106)/(39-85) 63/59 (07/05 0400) SpO2:  [91 %-100 %] 100 % (07/05 1700) Arterial Line BP: (67-93)/(59-75) 73/62 (07/05 1700) FiO2 (%):  [50 %-100 %] 50 % (07/05 1540) Weight:  [94.8 kg (208 lb 15.9 oz)] 94.8 kg (208 lb 15.9 oz) (07/05 0430) Last BM Date: 04/23/17 Mean arterial Pressure 71  Intake/Output:   Intake/Output Summary (Last 24 hours) at 04/24/17 1923 Last data filed at 04/24/17 1800  Gross per 24 hour  Intake           3254.2 ml  Output             5345 ml  Net          -2090.8 ml     Physical Exam: General:  Intubated but alert Cor: distant heart sounds with LVAD hum present. Lungs: coarse bilat Chest incision healing well Abdomen: soft, nontender, nondistended. Hypoactive BS Extremities: no cyanosis, clubbing, rash, edema Neuro: alert & orientedx3, cranial nerves grossly intact. moves all 4 extremities w/o difficulty. Affect pleasant  Telemetry: atrial paced 86  Labs: Basic Metabolic  Panel:  Recent Labs Lab 04/20/17 0323 04/21/17 0334 04/22/17 0339 04/23/17 0414 04/23/17 2225 04/24/17 0238 04/24/17 1919  NA 133* 133* 132* 133* 132* 133* 134*  K 4.1 4.3 3.8 3.9 4.7 4.6 4.3  CL 100* 100* 100* 100* 99* 101 99*  CO2 26 24 24 24 23 23   --   GLUCOSE 73 70 120* 104* 68 98 119*  BUN 38* 46* 48* 49* 59* 59* 56*  CREATININE 2.10* 2.31* 2.22* 2.09* 2.68* 2.60* 2.60*  CALCIUM 8.0* 8.3* 8.1* 8.2* 8.2* 7.8*  --   MG 2.1 2.1 2.0 2.0  --  1.8  --   PHOS 4.5 4.3 3.6 3.4  --  4.0  --     Liver Function Tests:  Recent Labs Lab 04/21/17 0334 04/22/17 0339 04/23/17 0414 04/23/17 2225 04/24/17 0238  AST 25 20 20 24 22   ALT 11* 9* 8* 11* 9*  ALKPHOS 97 90 95 88 86  BILITOT 1.7* 1.5* 1.2 1.2 1.1  PROT 5.7* 5.4* 5.5* 5.9* 5.1*  ALBUMIN 2.6* 2.4* 2.4* 2.5* 2.3*    Recent Labs Lab 04/23/17 2225  LIPASE 93*  AMYLASE 91   No results for input(s): AMMONIA in the last 168 hours.  CBC:  Recent Labs Lab 04/20/17 0323 04/21/17 0334 04/22/17 0339 04/23/17 0414 04/24/17  3532 04/24/17 1919  WBC 14.6* 17.7* 14.8* 11.2* 12.4*  --   NEUTROABS 12.2* 15.4* 12.4* 8.7* 11.6*  --   HGB 7.2* 10.0* 9.6* 9.9* 10.1* 10.2*  HCT 22.0* 30.4* 30.1* 31.0* 31.9* 30.0*  MCV 85.6 85.4 86.2 90.4 89.6  --   PLT 139* 204 247 303 303  --     INR:  Recent Labs Lab 04/20/17 0323 04/21/17 0334 04/22/17 0339 04/23/17 0414 04/24/17 0238  INR 1.44 1.25 1.33 1.68 2.61    Other results:  EKG:   Imaging: Dg Chest Port 1 View  Result Date: 04/24/2017 CLINICAL DATA:  Acute respiratory failure EXAM: PORTABLE CHEST 1 VIEW COMPARISON:  Chest radiograph 04/23/2017 FINDINGS: Endotracheal tube tip is below the level of the clavicular heads. The carina is poorly visualized. Recommend retraction of the endotracheal tube by 2.5 cm. There is a right-sided PICC line with tip at the cavoatrial junction. There is left basilar consolidation and bilateral parahilar opacities. Unchanged appearance  of left ventricular assist device. Enteric tube courses beyond the field of view. Unchanged cardiomegaly. IMPRESSION: 1. Endotracheal tube tip below the level of the clavicles, with poor visualization of the carina. 2.5 cm retraction recommended. 2. Mild-to-moderate pulmonary edema and left basilar consolidation, possibly atelectasis. Electronically Signed   By: Ulyses Jarred M.D.   On: 04/24/2017 02:54   Dg Chest Port 1 View  Result Date: 04/23/2017 CLINICAL DATA:  Vomiting. EXAM: PORTABLE CHEST 1 VIEW COMPARISON:  Chest radiographs 04/21/2017 FINDINGS: Previous right internal jugular sheath has been removed. Right upper extremity PICC is unchanged, tip at the atrial caval junction. Left ventricular assist device again seen. Left-sided pacemaker again seen. Very low lung volumes. Post median sternotomy. Grossly stable cardiomegaly. Linear left perihilar opacity is likely atelectasis. Increased right basilar atelectasis. No pneumothorax. No convincing pulmonary edema. IMPRESSION: 1. Very low lung volumes with bibasilar atelectasis. Stable cardiomegaly. Left ventricular assist device again seen. 2. Tip of the right upper extremity PICC at the atrial caval junction. Electronically Signed   By: Jeb Levering M.D.   On: 04/23/2017 21:16   Dg Abd Portable 1v  Result Date: 04/23/2017 CLINICAL DATA:  Vomiting. EXAM: PORTABLE ABDOMEN - 1 VIEW COMPARISON:  Radiographs 04/18/2017, CTA 04/12/2017 FINDINGS: Marked gaseous gastric distension. Dilated small bowel throughout the abdomen measures up to 4.9 cm. No evidence of free air. Multiple overlying monitoring devices. Vascular calcifications. Left ventricular assist device partially included. IMPRESSION: Marked gaseous gastric distension nondilated small bowel, small bowel obstruction versus severe ileus. Electronically Signed   By: Jeb Levering M.D.   On: 04/23/2017 21:18      Medications:     Scheduled Medications: . aspirin EC  325 mg Oral Daily   Or   . aspirin  324 mg Per Tube Daily   Or  . aspirin  300 mg Rectal Daily  . bisacodyl  10 mg Oral Daily   Or  . bisacodyl  10 mg Rectal Daily  . chlorhexidine gluconate (MEDLINE KIT)  15 mL Mouth Rinse BID  . Chlorhexidine Gluconate Cloth  6 each Topical Daily  . fentaNYL (SUBLIMAZE) injection  50 mcg Intravenous Once  . insulin aspart  0-24 Units Subcutaneous Q4H  . mouth rinse  15 mL Mouth Rinse QID  . sodium chloride flush  10-40 mL Intracatheter Q12H  . sodium chloride flush  10-40 mL Intracatheter Q12H  . Warfarin - Physician Dosing Inpatient   Does not apply q1800     Infusions: . Marland KitchenTPN (CLINIMIX-E) Adult 40  mL/hr at 04/24/17 1833  . sodium chloride Stopped (04/19/17 1200)  . sodium chloride Stopped (04/21/17 1500)  . amiodarone 30 mg/hr (04/24/17 1800)  . fentaNYL infusion INTRAVENOUS 40 mcg/hr (04/24/17 1800)  . lactated ringers    . lactated ringers 20 mL/hr at 04/24/17 1800  . norepinephrine (LEVOPHED) Adult infusion Stopped (04/24/17 1800)  . phenylephrine (NEO-SYNEPHRINE) Adult infusion Stopped (04/24/17 1239)  . piperacillin-tazobactam (ZOSYN)  IV Stopped (04/24/17 1600)  . [START ON 04/25/2017] vancomycin       PRN Medications:  albuterol, docusate, fentaNYL, hydrALAZINE, midazolam, midazolam, neomycin-bacitracin-polymyxin, ondansetron (ZOFRAN) IV, oxyCODONE, phenol, sodium chloride flush, sodium chloride flush, traMADol  Assessment/Plan/Discussion:    POD 7s/p HVAD for ischemic cardiomyopathy with acute on chronic systolic heart failure, EF 15% with moderate RV dysfunction. Returned to OR for bleeding from raw mediastinal tissues. Hemodynamic instability overnight due to ileus, vomiting, aspiration, intubation. He has stabilized today and now on low dose levophed 2 mcg. Wean as tolerated.  Acute respiratory failure due to aspiration. Intubated on vent. CCM following. Started on Vanc and Zosyn for possible aspiration pneumonia on CXR.  Ileus with large amount of  NG output initially which looks like liquid stool. His abdomen appeared benign yesterday but he was nauseous and having hiccups in the morning. He has had a BM in the last couple days. Hopefully this is not ischemic gut although the appearance of the NG drainage is concerning. He has not had any metabolic acidosis and his abdomen is benign now. He mouths that he feels better.  INR increased to 2.61. No coumadin today. Will need heparin when he gets down to 2.  Stage 3 CKD: creat increased a little today.  Probably ATN from surgery and now volume loss into gut and hypotension overnight.  DM: preop Hgb A1c 5.9. Glucose under good control. ContinueLevemir and SSI.  Chronic anemia felt to be due to chronic disease. Hgb stable  Gout: observe  Hx of arrhythmias preop: continuepo amio. ICD in place but will wait to turn on until next week.  Malnutrition: Start TNA   I reviewed the LVAD parameters from today, and compared the results to the patient's prior recorded data. LVAD equipment check completed andis in good working order. Back-up equipment present.   I discussed his clinical status with his wife at the bedside this afternoon.  Length of Stay: [address]  Gaye Pollack 04/24/2017, 7:23 PM

## 2017-04-24 NOTE — Procedures (Signed)
Arterial Catheter Insertion Procedure Note James FabianHarry D [name] [id number] 23-Jul-1961  Procedure: Insertion of Arterial Catheter  Indications: Blood pressure monitoring  Procedure Details Consent: Unable to obtain consent because of altered level of consciousness. Time Out: Verified patient identification, verified procedure, site/side was marked, verified correct patient position, special equipment/implants available, medications/allergies/relevent history reviewed, required imaging and test results available.  Performed  Maximum sterile technique was used including antiseptics, cap, gloves, gown, hand hygiene, mask and sheet. Skin prep: Chlorhexidine; local anesthetic administered 20 gauge catheter was inserted into left radial artery using the Seldinger technique.  Evaluation Blood flow good; BP tracing good. Complications: No apparent complications.   James [name], James [name] 04/24/2017

## 2017-04-24 NOTE — Progress Notes (Signed)
Sputum sample obtained and sent to lab by RT. 

## 2017-04-24 NOTE — Progress Notes (Addendum)
Patient ID: James [name], male   DOB: [birth year], 56 y.o.   MRN: [id number]   Advanced Heart Failure VAD Team Note  Subjective:    Initially admitted and aggressively diuresed.    To OR for HVAD placement 6/28.  Returned to the OR that evening with high chest tube output, evacuation of mediastinal hematoma.   Extubated 6/29.   Walked around unit yesterday.  Breathing ok.  Had some nausea last night.  Having hiccups.    LVAD speed increased to 2820 7/2 due to increased peak/trough. He got an IV Lasix bolus then had suction events and MAP fell, epinephrine was restarted then later stopped. Speed decreased to 7960.  Off NE and NO.   Milrinone stopped on 7/4.  Evening of 7/4, patient developed nausea and vomiting.  KUB with ileus.  He developed increased dyspnea/tachypnea.  NGT placed with 3 L suctioned out.  CXR with suspicion for aspiration PNA.  Patient had to be intubated.  He went into atrial fibrillation with RVR.  He became hypotensive, now on norepi 12, phenylephrine 200.  Amiodarone gtt begun.     This morning, he is back in NSR.  Creatinine up to 2.6. CVP 6, co-ox 79%.   Lactate not elevated this morning and HCO3 not low.  PCT elevated at 1.96.  INR 2.61. Tm 100.6.  MAP 85.  He is back in NSR.   1 low flow alarm on HVAD occurred while he was being suctioned on vent.   Creatinine 1.6-> 1.8 -> 2.1-> 2.31 -> 2.22 -> 2.09 -> 2.6.   Got 1 unit PRBCs on 7/2.   HVAD INTERROGATION:  HVAD:  Flow 4.7 liters/min, speed 2820, power 4.9 W, peak 7/trough 3.5.  1 low flow alarm occurred during suctioning.   Objective:    Vital Signs:   Temp:  [97.3 F (36.3 C)-100.6 F (38.1 C)] 100.6 F (38.1 C) (07/05 0315) Pulse Rate:  [32-135] 135 (07/05 0400) Resp:  [18-41] 20 (07/05 0700) BP: (63-106)/(39-93) 63/59 (07/05 0400) SpO2:  [91 %-100 %] 100 % (07/05 0645) Arterial Line BP: (67-90)/(63-75) 90/70 (07/05 0700) FiO2 (%):  [60 %-100 %] 60 % (07/05 0645) Weight:  [208 lb 15.9 oz (94.8 kg)]  208 lb 15.9 oz (94.8 kg) (07/05 0430) Last BM Date: 04/23/17 Mean arterial Pressure 85  Intake/Output:   Intake/Output Summary (Last 24 hours) at 04/24/17 0749 Last data filed at 04/24/17 0700  Gross per 24 hour  Intake          2232.93 ml  Output             4275 ml  Net         -2042.07 ml     Physical Exam    General:  Intubated, sedated but opens eyes.  HEENT: normal anicteric Neck: supple. JVP 7.  Cor: Sternal dressing ok. LVAD hum.  Lungs: Crackles dependently.    Abdomen: soft NT/ND good BS Extremities: warm. Trace ankle edema. RLE ulcer with dressing  + diffuse severe gouty changes Neuro: Opens eyes on vent, follows commands.    Telemetry   Personally reviewed. A-paced, v-sensed in 44s.    Labs   Basic Metabolic Panel:  Recent Labs Lab 04/20/17 0323 04/21/17 0334 04/22/17 0339 04/23/17 0414 04/23/17 2225 04/24/17 0238  NA 133* 133* 132* 133* 132* 133*  K 4.1 4.3 3.8 3.9 4.7 4.6  CL 100* 100* 100* 100* 99* 101  CO2 _0 GLUCOSE 73 70 120*  104* 68 98  BUN 38* 46* 48* 49* 59* 59*  CREATININE 2.10* 2.31* 2.22* 2.09* 2.68* 2.60*  CALCIUM 8.0* 8.3* 8.1* 8.2* 8.2* 7.8*  MG 2.1 2.1 2.0 2.0  --  1.8  PHOS 4.5 4.3 3.6 3.4  --  4.0    Liver Function Tests:  Recent Labs Lab 04/21/17 0334 04/22/17 0339 04/23/17 0414 04/23/17 2225 04/24/17 0238  AST 25 20 20 24 22  ALT 11* 9* 8* 11* 9*  ALKPHOS 97 90 95 88 86  BILITOT 1.7* 1.5* 1.2 1.2 1.1  PROT 5.7* 5.4* 5.5* 5.9* 5.1*  ALBUMIN 2.6* 2.4* 2.4* 2.5* 2.3*    Recent Labs Lab 04/23/17 2225  LIPASE 93*  AMYLASE 91   No results for input(s): AMMONIA in the last 168 hours.  CBC:  Recent Labs Lab 04/20/17 0323 04/21/17 0334 04/22/17 0339 04/23/17 0414 04/24/17 0238  WBC 14.6* 17.7* 14.8* 11.2* 12.4*  NEUTROABS 12.2* 15.4* 12.4* 8.7* 11.6*  HGB 7.2* 10.0* 9.6* 9.9* 10.1*  HCT 22.0* 30.4* 30.1* 31.0* 31.9*  MCV 85.6 85.4 86.2 90.4 89.6  PLT 139* 204 247 303 303     INR:  Recent Labs Lab 04/20/17 0323 04/21/17 0334 04/22/17 0339 04/23/17 0414 04/24/17 0238  INR 1.44 1.25 1.33 1.68 2.61    Other results:     Imaging   Dg Chest Port 1 View  Result Date: 04/24/2017 CLINICAL DATA:  Acute respiratory failure EXAM: PORTABLE CHEST 1 VIEW COMPARISON:  Chest radiograph 04/23/2017 FINDINGS: Endotracheal tube tip is below the level of the clavicular heads. The carina is poorly visualized. Recommend retraction of the endotracheal tube by 2.5 cm. There is a right-sided PICC line with tip at the cavoatrial junction. There is left basilar consolidation and bilateral parahilar opacities. Unchanged appearance of left ventricular assist device. Enteric tube courses beyond the field of view. Unchanged cardiomegaly. IMPRESSION: 1. Endotracheal tube tip below the level of the clavicles, with poor visualization of the carina. 2.5 cm retraction recommended. 2. Mild-to-moderate pulmonary edema and left basilar consolidation, possibly atelectasis. Electronically Signed   By: Kevin  Herman M.D.   On: 04/24/2017 02:54   Dg Chest Port 1 View  Result Date: 04/23/2017 CLINICAL DATA:  Vomiting. EXAM: PORTABLE CHEST 1 VIEW COMPARISON:  Chest radiographs 04/21/2017 FINDINGS: Previous right internal jugular sheath has been removed. Right upper extremity PICC is unchanged, tip at the atrial caval junction. Left ventricular assist device again seen. Left-sided pacemaker again seen. Very low lung volumes. Post median sternotomy. Grossly stable cardiomegaly. Linear left perihilar opacity is likely atelectasis. Increased right basilar atelectasis. No pneumothorax. No convincing pulmonary edema. IMPRESSION: 1. Very low lung volumes with bibasilar atelectasis. Stable cardiomegaly. Left ventricular assist device again seen. 2. Tip of the right upper extremity PICC at the atrial caval junction. Electronically Signed   By: Melanie  Ehinger M.D.   On: 04/23/2017 21:16   Dg Abd Portable  1v  Result Date: 04/23/2017 CLINICAL DATA:  Vomiting. EXAM: PORTABLE ABDOMEN - 1 VIEW COMPARISON:  Radiographs 04/18/2017, CTA 04/12/2017 FINDINGS: Marked gaseous gastric distension. Dilated small bowel throughout the abdomen measures up to 4.9 cm. No evidence of free air. Multiple overlying monitoring devices. Vascular calcifications. Left ventricular assist device partially included. IMPRESSION: Marked gaseous gastric distension nondilated small bowel, small bowel obstruction versus severe ileus. Electronically Signed   By: Melanie  Ehinger M.D.   On: 04/23/2017 21:18     Medications:     Scheduled Medications: . amiodarone  200 mg Oral BID  .   aspirin EC  325 mg Oral Daily   Or  . aspirin  324 mg Per Tube Daily   Or  . aspirin  300 mg Rectal Daily  . atorvastatin  80 mg Oral q1800  . bisacodyl  10 mg Oral Daily   Or  . bisacodyl  10 mg Rectal Daily  . chlorhexidine gluconate (MEDLINE KIT)  15 mL Mouth Rinse BID  . Chlorhexidine Gluconate Cloth  6 each Topical Daily  . colchicine  0.6 mg Oral Q supper  . digoxin  0.0625 mg Oral Daily  . febuxostat  120 mg Oral Daily  . feeding supplement (ENSURE ENLIVE)  237 mL Oral BID BM  . fentaNYL (SUBLIMAZE) injection  50 mcg Intravenous Once  . guaiFENesin  600 mg Oral BID  . insulin aspart  0-24 Units Subcutaneous Q4H  . mouth rinse  15 mL Mouth Rinse QID  . sodium chloride flush  10-40 mL Intracatheter Q12H  . sodium chloride flush  10-40 mL Intracatheter Q12H  . Warfarin - Physician Dosing Inpatient   Does not apply q1800    Infusions: . sodium chloride Stopped (04/19/17 1200)  . sodium chloride Stopped (04/21/17 1500)  . amiodarone 60 mg/hr (04/24/17 0700)   Followed by  . amiodarone    . famotidine (PEPCID) IV    . fentaNYL infusion INTRAVENOUS 30 mcg/hr (04/24/17 0700)  . lactated ringers    . lactated ringers 20 mL/hr at 04/23/17 0800  . norepinephrine (LEVOPHED) Adult infusion 12 mcg/min (04/24/17 0700)  . phenylephrine  (NEO-SYNEPHRINE) Adult infusion 200 mcg/min (04/24/17 0700)  . piperacillin-tazobactam (ZOSYN)  IV 3.375 g (04/24/17 0553)  . [START ON 04/25/2017] vancomycin    . vancomycin 2,000 mg (04/24/17 0551)    PRN Medications: albuterol, docusate, fentaNYL, hydrALAZINE, midazolam, midazolam, neomycin-bacitracin-polymyxin, ondansetron (ZOFRAN) IV, oxyCODONE, phenol, simethicone, sodium chloride flush, sodium chloride flush, traMADol   Patient Profile   56 yo with CAD s/p CABG, ischemic cardiomyopathy/chronic systolic CHF, tophaceous gout, and CKD stage 3 was admitted for diuresis and consideration for LVAD placement. S/p HVAD on 6/28  Assessment/Plan:    1. Acute/chronic systolic CHF: Ischemic cardiomyopathy.  St Jude ICD.  Echo (6/18) with EF 15%, mildly dilated RV with moderately decreased systolic function.  RHC 6/22 with elevated filling pressures and evidence of RV failure though PAPi score 1.95 so not prohibitive.  He has been seen at [facility] and turned down for transplant with severe tophaceous gout and concern for worsening on transplant meds.  He was diuresed extensively, repeat RHC 6/26 with marked improvement in filling pressures and stable cardiac output on milrinone.  HVAD placement 6/28 and had to return to OR to evacuate mediastinal hematoma.  He had been weaned off pressors and milrinone, but developed ileus and likely aspiration event yesterday, re-intubated, developed afib/RVR, and now back on norepinephrine and phenylephrine.  CVP 6, co-ox 79%.  He is not acidotic this morning.  - Will keep speed at 2820 rpm.  - No Lasix today.  - MAP 85 currently, can start to wean phenylephrine (then norepinephrine).   - On ASA and warfarin, INR 2.6.  - Stop digoxin with rise in creatinine.  2. AKI on CKD stage 3: Suspect a component of peri-op ATN, worsened yesterday with ileus, vomiting.  Currently creatinine 2.6.  CVP 6.  No Lasix today, support MAP as needed.   3. Anemia: Chronic, seen by GI, they  do not feel that he needs endoscopy.  FOBT negative. He had 1 unit PRBCs  7/1 with appropriate bump.  Stable today.  4. CAD: s/p CABG 2012.  Stable.   5. Gout: Severe tophaceous gout. Controlled today.  On Uloric, will continue colchicine daily.    6. Atrial fibrillation: Developed atrial fibrillation with RVR in setting of aspiration PNA yesterday.  Amiodarone gtt begun, now out of atrial fibrillation (a-paced).    7. Malnutrition: Major issue with baseline poor nutritional status and now with ileus and intubated again.  Will consult nutrition for TNA.  8. Suspect aspiration PNA with acute hypoxemic respiratory failure: CXR last night was concerning.  He has ileus and vomited copiously, NG suction with 3 L out.  Elevated PCT.  - Continue vancomycin/Zosyn. - CCM following vent.  9. Ileus: Vomiting on 7/4 with aspiration PNA.  Lactate normal today and HCO3 normal, hopefully does not have ischemic bowel.  910. Deconditioning  I reviewed the LVAD parameters from today, and compared the results to the patient's prior recorded data.  No programming changes were made.  The LVAD is functioning within specified parameters.  The patient performs LVAD self-test daily.  LVAD interrogation was negative for any significant power changes, alarms or PI events/speed drops.  LVAD equipment check completed and is in good working order.  Back-up equipment present.   LVAD education done on emergency procedures and precautions and reviewed exit site care.  CRITICAL CARE Performed by: Dalton McLean  Total critical care time: 40 minutes  Critical care time was exclusive of separately billable procedures and treating other patients.  Critical care was necessary to treat or prevent imminent or life-threatening deterioration.  Critical care was time spent personally by me on the following activities: development of treatment plan with patient and/or surrogate as well as nursing, discussions with consultants, evaluation  of patient's response to treatment, examination of patient, obtaining history from patient or surrogate, ordering and performing treatments and interventions, ordering and review of laboratory studies, ordering and review of radiographic studies, pulse oximetry and re-evaluation of patient's condition.  Dalton McLean, MD 04/24/2017, 7:49 AM  VAD Team --- VAD ISSUES ONLY--- Pager [phone] (7am - 7am)  Advanced Heart Failure Team  Pager [phone] (M-F; 7a - 4p)  Please contact [facility] for night-coverage after hours (4p -7a ) and weekends on [email or web address]   

## 2017-04-24 NOTE — Progress Notes (Signed)
PT Cancellation Note  Patient Details Name: James [name] MRN: [id number] DOB: 1961/04/03   Cancelled Treatment:    Reason Eval/Treat Not Completed: Medical issues which prohibited therapy (Noted events of last 12 hours.  Will check back tomorrow.  )   Berline LopesDawn F Shamal Stracener 04/24/2017, 1:06 PM  Cheri Ayotte Otilio MiuWhite,PT Acute Rehabilitation [phone] (pager)

## 2017-04-24 NOTE — Progress Notes (Signed)
PULMONARY / CRITICAL CARE MEDICINE   Name: James [name] MRN: [id number] DOB: 12-Jan-1961    ADMISSION DATE:  04/07/2017 CONSULTATION DATE:  04/23/2017  REFERRING MD:  Laneta Simmers - CVTS  CHIEF COMPLAINT:  Acute respiratory failure  HISTORY OF PRESENT ILLNESS:   [name] with CHF (EF 15%), AICD, CAD, Gout, HTN, CKD, DM, Anemia, admitted 03/31/2017 for VAD placement, as he had already been turned down by [facility] for transplant due to his gout and limitations with his mobility. Today patient is POD 7 s/p HVAD placement for his ischemic cardiomyopathy. His course has been complicated by AKI-on-CKD, and also complicated by pre-op Afib with RVR. Earlier this evening he began vomiting feculent material and was found to have an ileus. An NG tube was placed to suction. However, patient then developed acute hypoxic respiratory failure requiring 15L O2 NRB, and he developed worsening respiratory distress, prompting ICU consult.   When I first examined patient, his RR was 40 and lung auscultation revealed rhonchi. He appeared very weak and eyes rolling back in his head. Automated BP wouldn't pick up; manual SBP 80. RT attempted ABG but was unable. Out of concern for impending respiratory failure and cardiac arrest, decision was made to go ahead and intubate.   SUBJECTIVE:  No events overnight, pressor need decreased.  VITAL SIGNS: BP (!) 63/59 (BP Location: Left Arm)   Pulse 86 Comment: Simultaneous filing. User may not have seen previous data.  Temp (!) 97.2 F (36.2 C) (Axillary)   Resp 19 Comment: Simultaneous filing. User may not have seen previous data.  Ht 6' (1.829 m)   Wt 94.8 kg (208 lb 15.9 oz)   SpO2 100% Comment: Simultaneous filing. User may not have seen previous data.  BMI 28.34 kg/m   HEMODYNAMICS: CVP:  [4 mmHg-19 mmHg] 8 mmHg  VENTILATOR SETTINGS: Vent Mode: PRVC FiO2 (%):  [50 %-100 %] 50 % Set Rate:  [20 bmp] 20 bmp Vt Set:  [620 mL] 620 mL PEEP:  [5 cmH20] 5 cmH20 Plateau Pressure:   [16 cmH20-28 cmH20] 17 cmH20  INTAKE / OUTPUT: I/O last 3 completed shifts: In: 2484.8 [I.V.:1404.8; NG/GT:30; IV Piggyback:1050] Out: 4955 [Urine:1105; Emesis/NG output:3800; Chest Tube:50]  PHYSICAL EXAMINATION: General:  Acute on chronically ill appearing male, sedate, NAD Neuro:  Arousable to painful stimuli, moving all ext to pain HEENT:  [place]/AT, PERRL, EOM-I and MMM Cardiovascular:  RRR, LVAD sounds heard, difficult to hear heart valve over LVAD Lungs:  CTA bilaterally Abdomen:  Soft, NT, ND and +BS Musculoskeletal:  -edema and -tenderness, joint deformities noted Skin:  Intact  LABS:  BMET  Recent Labs Lab 04/23/17 0414 04/23/17 2225 04/24/17 0238  NA 133* 132* 133*  K 3.9 4.7 4.6  CL 100* 99* 101  CO2 24 23 23   BUN 49* 59* 59*  CREATININE 2.09* 2.68* 2.60*  GLUCOSE 104* 68 98    Electrolytes  Recent Labs Lab 04/22/17 0339 04/23/17 0414 04/23/17 2225 04/24/17 0238  CALCIUM 8.1* 8.2* 8.2* 7.8*  MG 2.0 2.0  --  1.8  PHOS 3.6 3.4  --  4.0    CBC  Recent Labs Lab 04/22/17 0339 04/23/17 0414 04/24/17 0238  WBC 14.8* 11.2* 12.4*  HGB 9.6* 9.9* 10.1*  HCT 30.1* 31.0* 31.9*  PLT 247 303 303    Coag's  Recent Labs Lab 03/27/2017 1638 04/18/17 0349  04/22/17 0339 04/23/17 0414 04/24/17 0238  APTT 52* 39*  --   --   --  51*  INR 1.46  --   < >  1.33 1.68 2.61  < > = values in this interval not displayed.  Sepsis Markers  Recent Labs Lab 04/24/17 0238 04/24/17 0550  LATICACIDVEN 2.5* 1.5  PROCALCITON 1.96  --     ABG  Recent Labs Lab 04/21/17 0410 04/24/17 0236 04/24/17 0642  PHART 7.431 7.434 7.496*  PCO2ART 38.1 37.6 30.3*  PO2ART 80.9* 198.0* 115.0*    Liver Enzymes  Recent Labs Lab 04/23/17 0414 04/23/17 2225 04/24/17 0238  AST 20 24 22   ALT 8* 11* 9*  ALKPHOS 95 88 86  BILITOT 1.2 1.2 1.1  ALBUMIN 2.4* 2.5* 2.3*    Cardiac Enzymes  Recent Labs Lab 04/24/17 0238  TROPONINI 2.11*    Glucose  Recent  Labs Lab 04/23/17 1920 04/23/17 2333 04/23/17 2353 04/24/17 0125 04/24/17 0215 04/24/17 0747  GLUCAP 80 50* 112* 69 111* 72    Imaging Dg Chest Port 1 View  Result Date: 04/24/2017 CLINICAL DATA:  Acute respiratory failure EXAM: PORTABLE CHEST 1 VIEW COMPARISON:  Chest radiograph 04/23/2017 FINDINGS: Endotracheal tube tip is below the level of the clavicular heads. The carina is poorly visualized. Recommend retraction of the endotracheal tube by 2.5 cm. There is a right-sided PICC line with tip at the cavoatrial junction. There is left basilar consolidation and bilateral parahilar opacities. Unchanged appearance of left ventricular assist device. Enteric tube courses beyond the field of view. Unchanged cardiomegaly. IMPRESSION: 1. Endotracheal tube tip below the level of the clavicles, with poor visualization of the carina. 2.5 cm retraction recommended. 2. Mild-to-moderate pulmonary edema and left basilar consolidation, possibly atelectasis. Electronically Signed   By: Deatra RobinsonKevin  Herman M.D.   On: 04/24/2017 02:54   Dg Chest Port 1 View  Result Date: 04/23/2017 CLINICAL DATA:  Vomiting. EXAM: PORTABLE CHEST 1 VIEW COMPARISON:  Chest radiographs 04/21/2017 FINDINGS: Previous right internal jugular sheath has been removed. Right upper extremity PICC is unchanged, tip at the atrial caval junction. Left ventricular assist device again seen. Left-sided pacemaker again seen. Very low lung volumes. Post median sternotomy. Grossly stable cardiomegaly. Linear left perihilar opacity is likely atelectasis. Increased right basilar atelectasis. No pneumothorax. No convincing pulmonary edema. IMPRESSION: 1. Very low lung volumes with bibasilar atelectasis. Stable cardiomegaly. Left ventricular assist device again seen. 2. Tip of the right upper extremity PICC at the atrial caval junction. Electronically Signed   By: Rubye OaksMelanie  Ehinger M.D.   On: 04/23/2017 21:16   Dg Abd Portable 1v  Result Date: 04/23/2017 CLINICAL  DATA:  Vomiting. EXAM: PORTABLE ABDOMEN - 1 VIEW COMPARISON:  Radiographs 04/18/2017, CTA 04/12/2017 FINDINGS: Marked gaseous gastric distension. Dilated small bowel throughout the abdomen measures up to 4.9 cm. No evidence of free air. Multiple overlying monitoring devices. Vascular calcifications. Left ventricular assist device partially included. IMPRESSION: Marked gaseous gastric distension nondilated small bowel, small bowel obstruction versus severe ileus. Electronically Signed   By: Rubye OaksMelanie  Ehinger M.D.   On: 04/23/2017 21:18     STUDIES:  None  CULTURES: Blood 7/5>>> Sputum 7/5>>>  ANTIBIOTICS: Vancomycin 7/5>>> Zosyn 7/5>>>  SIGNIFICANT EVENTS: 7/5 respiratory arrest post feculent vomitus episode  LINES/TUBES: ETT 7/5>>> Double lumen PICC on the right 7/2>>>  ASSESSMENT / PLAN: [name] with CHF (EF 15%), AICD, CAD, Gout, HTN, CKD, DM, Anemia, admitted 04/14/2017 for VAD placement, now POD 7 s/p HVAD placement, with course complicated by AKI-on-CKD and pre-op Afib with RVR. Overnight he developed feculent vomiting related to an ileus. And now he developed acute hypoxic respiratory failure with severe respiratory distress due to aspiration  pneumonia, requiring intubation, with associated development of septic shock.  PULMONARY 1. Acute hypoxic respiratory failure; Aspiration pneumonia: - Continue full vent support for now - Continue GI and DVT prophylaxis - NPO; NG tube to suction - TPN per pharmacy - Abx as below - Fentanyl gtt; versed PRN for sedation - Decrease RR to 14 with ABG in AM  CARDIOVASCULAR 1. CHF (EF 15%); CAD; History HTN - Continue HVAD; CT Surg and Cards to manage - Holding antihypertensive medications in setting of shock - Attempt to get neo off today but keep levo on unless becomes hypertensive  2. Afib with RVR: - Amiodarone gtt - Check electrolytes now  RENAL 1. AKI-on-CKD: - Hold of diureses for now - Replace electrolytes as indicated -  BMET in AM - KVO IVF - Placed foley catheter; check UA; monitor UOP closely  GASTROINTESTINAL 1. Ileus vs SBO: - Seen on KUB - NG tube now down and to suction - NPO - TPN per pharmacy  HEMATOLOGIC 1. Anemia: - Hgb currently 10; has baseline in 7-8's range - Unclear etiology; no clinical blood loss noted on my exam. Continue to monitor.  INFECTIOUS 1. Septic Shock: - Wean neo off as able, keep levo - Pan-culture - Procalcitonin 1.96 - Vanc and Zosyn - Follow CVP, currently 6.  ENDOCRINE 1. DM: - Currently NPO; continue accuchecks and SSI  NEUROLOGIC 1. Acute encephalopathy: due to acute illness - Titrate sedation for RASS -1   FAMILY  - Updates: No family bedside 7/5.  - Inter-disciplinary family meet or Palliative Care meeting due by:  Palliative care following  The patient is critically ill with multiple organ systems failure and requires high complexity decision making for assessment and support, frequent evaluation and titration of therapies, application of advanced monitoring technologies and extensive interpretation of multiple databases.   Critical Care Time devoted to patient care services described in this note is  45  Minutes. This time reflects time of care of this signee Dr Koren Bound. This critical care time does not reflect procedure time, or teaching time or supervisory time of PA/NP/Med student/Med Resident etc but could involve care discussion time.  Alyson Reedy, M.D. [facility] Pulmonary/Critical Care Medicine. Pager: [phone]. After hours pager: [phone].  04/24/2017, 10:04 AM

## 2017-04-24 NOTE — Progress Notes (Signed)
PHARMACY - ADULT TOTAL PARENTERAL NUTRITION CONSULT NOTE   Pharmacy Consult for TPN Indication: Ileus and poor baseline nutritional status  Patient Measurements: Height: 6' (182.9 cm) Weight: 208 lb 15.9 oz (94.8 kg) IBW/kg (Calculated) : 77.6 TPN AdjBW (KG): 96.9 Body mass index is 28.34 kg/m.  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 Patient started on enteral soft diet 7/1 but decompensated overnight requiring re-intubation and found to have ileus after vomiting fecal material. NGT was placed and 3000 ml suctioned. Pharmacy consulted to start TPN.   GI: New ileus with severe malnutrition at baseline. Abdomen distended. NGT output 3600 ml yesterday. Pre-albumin from 6/22 was <5. Albumin yesterday was 2.3.  Reglan 10 IV q6h x3 doses. Pepcid 20 IV daily. LBM was 7/4. Endo: CBG somewhat low (72-111).  Insulin requirements in the past 24 hours: 0 units SSI + 15 units Detemir (last dose 7/4, now discontinued).  Lytes: Na low 133 (has been low for past 5 days, 136 on admit). K 4.6 (up), Phos 4. Mg 1.8 (goal >2 with ileus). Caution with replacement in worsening AKI. CoCa 9.1.  Renal: AKI. SCr 2.09 >>2.60. BUN up 59. UOP low 0.2 cc/kg/hr. -2L last 24hrs (-6L for admission). S/p Lasix 60mg  IV x1 today.  Pulm: Vent- PRVC, FiO2 50% Cards: Hypotensive requiring pressors (Levophed @ 10, Neo @ 75). Milrinone is currently off. Amio drip initiated for atrial fibrillation. ST improving.  Hepatobil: LFTs wnl, Tbili down to nl (elevated on 7/2).  Neuro: Fentanyl drip at 30 mcg/hr + prn Midazolam. RASS -1. GCS 12.  Heme/AC: warfarin for atrial fibrillation initiated 7/4.  ID: Initiated on empiric vancomycin and Zosyn day #1. WBC 12.4, Tmax 100.6 but some hypothermic temperatures. LA down from 2.5 to 1.5. PCT 1.96. MRSA pcr positive. Cultures pending.   Best Practices: Warfarin (change to IV  heparin/Lovenox while NPO?) TPN Access: PICC 7/2 TPN start date: 7/5   Nutritional Goals (per RD recommendation on 7/5): KCal: 2200 Protein: 120-140 gm  Goal with lipids: Clinimix E 5/15 at 105 ml/hr with IV lipid emulsions (126g of protein and 2269 kcal per day)  Current Nutrition:  NPO  Plan:  Initiate Clinimix E 5/15 at 4740ml/hr Hold 20% lipid emulsion for first 7 days for ICU patients per ASPEN guidelines (Start date 7/12) [facility] This provides 48 g of protein and 681 kCals per day meeting 40% of protein and 31% of kCal needs Add MVI in TPN daily Add trace elements in TPN every other day, next 7/5 Continue TCTS q4h SSI  Discontinue IV Pepcid and change to 20 mg IV in TPN bag. Monitor TPN labs  Link SnufferJessica Chivonne Rascon, PharmD, BCPS Clinical Pharmacist Clinical Phone 04/24/2017 until 3:30 PM - [phone] After hours, please call #[phone] 04/24/2017,10:13 AM

## 2017-04-24 NOTE — Progress Notes (Addendum)
CT surgery p.m. Rounds  Patient with better blood pressure this evening off neo-and norepinephrine Sedated on IV fentanyl 40 g ABG satisfactory on FiO2 40%, normal bicarbonate Creatinine stable 2.6, urine output 30-40 cc/h. CVP 7 Scattered rhonchi on exam Abdomen soft nontender, patient afebrile Mean arterial pressure 65, atrially paced Good pulsatility on VAD monitor

## 2017-04-24 NOTE — Progress Notes (Addendum)
LVAD Inpatient Coordinator Rounding Note:  Admitted 03/31/2017 due to A/C Heart failure.   [employer] LVAD implanted on 03/24/2017 by Dr. Laneta SimmersBartle.   Vital signs: HR: 86 Doppler Pressure: 82 Automatic BP: not obtained O2 Sat:  100 on 60% vent Wt:207>209 > 213 >213 > 208    LVAD interrogation reveals:  Speed:  2820 Flow:  5.1 Power:  5 Alarms: Low Flow on 04/24/17 at 4:03 am Peak: 7.3 Trough: 3.8 HCT: 31 Low flow alarm setting: 3.0 High watt alarm setting:   7.0  Suction:  off [place] cycle:  on  Blood Products: 6/28> 5 PRBC's, 6 FFP 7/1> 1 PRBC  Gtts: Milrinone stopped 04/23/17 Neo 200 mcg/min Levo 12 mcg/min Amiodarone 60 mg/hr  Arrhythmia: 04/24/17 - Afib with RVR - started amiodarone  Respiratory: 04/24/17 - re-intubated due to respiratory failure secondary to suspected aspiration pneumonia  Drive Line: Daily Dressing Kits with Aquacell AG silver strips per protocol.   Labs:  LDH trend:160 (pre VAD)>227>215>191>212>207>222>217  INR trend: 1.31>1.45>1.44>1.25>1.33>1.68>2.61  Anticoagulation Plan: -INR Goal: 2-2.5 (eventually will start warfarin per surgery) -ASA Dose: 325 mg  Adverse Events on VAD: -Return to OR 6/28  with high chest tube output, evacuation of mediastinal hematoma -Ileus with vomiting on 04/23/17 followed by probable aspiration pneumonia with re-intubation  Plan/Recommendations:  1. Will hold VAD teaching until patient stabilizes.    Hessie DienerMolly Jawaun Celmer RN, VAD Coordinator 24/7 pager [phone]

## 2017-04-25 ENCOUNTER — Inpatient Hospital Stay (HOSPITAL_COMMUNITY): Payer: Medicare PPO

## 2017-04-25 DIAGNOSIS — R570 Cardiogenic shock: Secondary | ICD-10-CM

## 2017-04-25 DIAGNOSIS — G9340 Encephalopathy, unspecified: Secondary | ICD-10-CM

## 2017-04-25 DIAGNOSIS — I5023 Acute on chronic systolic (congestive) heart failure: Secondary | ICD-10-CM

## 2017-04-25 DIAGNOSIS — J9601 Acute respiratory failure with hypoxia: Secondary | ICD-10-CM

## 2017-04-25 DIAGNOSIS — Z95811 Presence of heart assist device: Secondary | ICD-10-CM

## 2017-04-25 LAB — CBC
HEMATOCRIT: 33.4 % — AB (ref 39.0–52.0)
HEMOGLOBIN: 11 g/dL — AB (ref 13.0–17.0)
MCH: 30 pg (ref 26.0–34.0)
MCHC: 32.9 g/dL (ref 30.0–36.0)
MCV: 91 fL (ref 78.0–100.0)
Platelets: 380 10*3/uL (ref 150–400)
RBC: 3.67 MIL/uL — ABNORMAL LOW (ref 4.22–5.81)
RDW: 19.3 % — AB (ref 11.5–15.5)
WBC: 20.4 10*3/uL — ABNORMAL HIGH (ref 4.0–10.5)

## 2017-04-25 LAB — COMPREHENSIVE METABOLIC PANEL
ALBUMIN: 2.6 g/dL — AB (ref 3.5–5.0)
ALT: 8 U/L — AB (ref 17–63)
AST: 23 U/L (ref 15–41)
Alkaline Phosphatase: 95 U/L (ref 38–126)
Anion gap: 11 (ref 5–15)
BILIRUBIN TOTAL: 1.3 mg/dL — AB (ref 0.3–1.2)
BUN: 68 mg/dL — ABNORMAL HIGH (ref 6–20)
CHLORIDE: 100 mmol/L — AB (ref 101–111)
CO2: 20 mmol/L — ABNORMAL LOW (ref 22–32)
CREATININE: 3 mg/dL — AB (ref 0.61–1.24)
Calcium: 8.1 mg/dL — ABNORMAL LOW (ref 8.9–10.3)
GFR calc Af Amer: 26 mL/min — ABNORMAL LOW (ref >60)
GFR, EST NON AFRICAN AMERICAN: 22 mL/min — AB (ref >60)
GLUCOSE: 211 mg/dL — AB (ref 65–99)
POTASSIUM: 4.3 mmol/L (ref 3.5–5.1)
Sodium: 131 mmol/L — ABNORMAL LOW (ref 135–145)
TOTAL PROTEIN: 5.7 g/dL — AB (ref 6.5–8.1)

## 2017-04-25 LAB — GLUCOSE, CAPILLARY
GLUCOSE-CAPILLARY: 175 mg/dL — AB (ref 65–99)
GLUCOSE-CAPILLARY: 177 mg/dL — AB (ref 65–99)
Glucose-Capillary: 179 mg/dL — ABNORMAL HIGH (ref 65–99)
Glucose-Capillary: 198 mg/dL — ABNORMAL HIGH (ref 65–99)
Glucose-Capillary: 170 mg/dL — ABNORMAL HIGH (ref 65–99)

## 2017-04-25 LAB — DIFFERENTIAL
BAND NEUTROPHILS: 0 %
BASOS ABS: 0 10*3/uL (ref 0.0–0.1)
Basophils Relative: 0 %
Blasts: 0 %
EOS ABS: 0.2 10*3/uL (ref 0.0–0.7)
Eosinophils Relative: 1 %
LYMPHS PCT: 3 %
Lymphs Abs: 0.6 10*3/uL — ABNORMAL LOW (ref 0.7–4.0)
MONO ABS: 1 10*3/uL (ref 0.1–1.0)
MONOS PCT: 5 %
Metamyelocytes Relative: 0 %
Myelocytes: 0 %
NEUTROS PCT: 91 %
Neutro Abs: 18.6 10*3/uL — ABNORMAL HIGH (ref 1.7–7.7)
Other: 0 %
PROMYELOCYTES ABS: 0 %
WBC MORPHOLOGY: INCREASED
nRBC: 0 /100 WBC

## 2017-04-25 LAB — BLOOD GAS, ARTERIAL
ACID-BASE DEFICIT: 3.9 mmol/L — AB (ref 0.0–2.0)
Bicarbonate: 20.5 mmol/L (ref 20.0–28.0)
DRAWN BY: 252031
FIO2: 40
MECHVT: 620 mL
O2 SAT: 98.1 %
PCO2 ART: 35.8 mmHg (ref 32.0–48.0)
PEEP: 5 cmH2O
PH ART: 7.375 (ref 7.350–7.450)
PO2 ART: 115 mmHg — AB (ref 83.0–108.0)
Patient temperature: 98.1
RATE: 14 resp/min

## 2017-04-25 LAB — PROTIME-INR
INR: 2.4
PROTHROMBIN TIME: 26.6 s — AB (ref 11.4–15.2)

## 2017-04-25 LAB — PHOSPHORUS: PHOSPHORUS: 5.1 mg/dL — AB (ref 2.5–4.6)

## 2017-04-25 LAB — BASIC METABOLIC PANEL
ANION GAP: 9 (ref 5–15)
BUN: 69 mg/dL — ABNORMAL HIGH (ref 6–20)
CO2: 22 mmol/L (ref 22–32)
Calcium: 7.6 mg/dL — ABNORMAL LOW (ref 8.9–10.3)
Chloride: 101 mmol/L (ref 101–111)
Creatinine, Ser: 3.26 mg/dL — ABNORMAL HIGH (ref 0.61–1.24)
GFR, EST AFRICAN AMERICAN: 23 mL/min — AB (ref >60)
GFR, EST NON AFRICAN AMERICAN: 20 mL/min — AB (ref >60)
GLUCOSE: 171 mg/dL — AB (ref 65–99)
POTASSIUM: 3.7 mmol/L (ref 3.5–5.1)
Sodium: 132 mmol/L — ABNORMAL LOW (ref 135–145)

## 2017-04-25 LAB — PROCALCITONIN: PROCALCITONIN: 4.6 ng/mL

## 2017-04-25 LAB — MAGNESIUM: Magnesium: 1.9 mg/dL (ref 1.7–2.4)

## 2017-04-25 LAB — TRIGLYCERIDES: TRIGLYCERIDES: 78 mg/dL (ref <150)

## 2017-04-25 LAB — COOXEMETRY PANEL
Carboxyhemoglobin: 1.3 % (ref 0.5–1.5)
Methemoglobin: 1.3 % (ref 0.0–1.5)
O2 Saturation: 70 %
Total hemoglobin: 11.2 g/dL — ABNORMAL LOW (ref 12.0–16.0)

## 2017-04-25 LAB — LACTATE DEHYDROGENASE: LDH: 219 U/L — ABNORMAL HIGH (ref 98–192)

## 2017-04-25 LAB — PREALBUMIN

## 2017-04-25 MED ORDER — POTASSIUM CHLORIDE 10 MEQ/50ML IV SOLN
10.0000 meq | INTRAVENOUS | Status: AC
  Administered 2017-04-25 (×2): 10 meq via INTRAVENOUS
  Filled 2017-04-25 (×2): qty 50

## 2017-04-25 MED ORDER — MAGNESIUM SULFATE 2 GM/50ML IV SOLN: 2.0000 g | Freq: Once | INTRAVENOUS | Status: DC

## 2017-04-25 MED ORDER — M.V.I. ADULT IV INJ
INTRAVENOUS | Status: AC
  Administered 2017-04-25: 17:00:00 via INTRAVENOUS
  Filled 2017-04-25: qty 960

## 2017-04-25 MED ORDER — CHLORHEXIDINE GLUCONATE 0.12 % MT SOLN
OROMUCOSAL | Status: AC
  Filled 2017-04-25: qty 15

## 2017-04-25 MED ORDER — MAGNESIUM SULFATE IN D5W 1-5 GM/100ML-% IV SOLN
1.0000 g | Freq: Once | INTRAVENOUS | Status: AC
  Administered 2017-04-25: 1 g via INTRAVENOUS
  Filled 2017-04-25: qty 100

## 2017-04-25 MED ORDER — ALBUMIN HUMAN 25 % IV SOLN
12.5000 g | Freq: Four times a day (QID) | INTRAVENOUS | Status: AC
  Administered 2017-04-25 – 2017-04-26 (×3): 12.5 g via INTRAVENOUS
  Filled 2017-04-25 (×3): qty 50

## 2017-04-25 NOTE — Progress Notes (Signed)
Patient ID: James [name], male   DOB: Feb 26, 1961, 56 y.o.   MRN: [id number] HVAD Rounding Note  Subjective:    Intubated on vent. Levophed 5 mcg with MAP 85 now but has been 70-85. CVP 8 and Co-ox 70%.  LVAD INTERROGATION:  HVAD:  Flow 5.8 liters/min, speed 2820, power 5.8, multiple suction events this am while patient was being suctioned and coughing.   Objective:    Vital Signs:   Temp:  [98.4 F (36.9 C)-98.9 F (37.2 C)] 98.4 F (36.9 C) (07/06 0355) Pulse Rate:  [85-92] 86 (07/06 0818) Resp:  [14-18] 14 (07/06 0818) SpO2:  [98 %-100 %] 100 % (07/06 0818) Arterial Line BP: (70-96)/(59-90) 77/72 (07/06 0600) FiO2 (%):  [40 %-50 %] 40 % (07/06 0950) Weight:  [92.1 kg (203 lb 0.7 oz)] 92.1 kg (203 lb 0.7 oz) (07/06 0452) Last BM Date: 04/23/17 Mean arterial Pressure 70-85  Intake/Output:   Intake/Output Summary (Last 24 hours) at 04/25/17 1117 Last data filed at 04/25/17 0600  Gross per 24 hour  Intake          2235.22 ml  Output             1830 ml  Net           405.22 ml     Physical Exam: General:  Looks weak but alert and nodding appropriately Cor: distant heart sounds with LVAD hum present. Lungs: few crackles in lower lobes Abdomen: soft, nontender, nondistended. Hypoactive BS.  Extremities: mild edema Neuro: alert & orientedx3.  moves all 4 extremities w/o difficulty. Affect pleasant  Telemetry: atrial paced 86  Labs: Basic Metabolic Panel:  Recent Labs Lab 04/21/17 0334 04/22/17 0339 04/23/17 0414 04/23/17 2225 04/24/17 0238 04/24/17 1919 04/25/17 0452  NA 133* 132* 133* 132* 133* 134* 131*  K 4.3 3.8 3.9 4.7 4.6 4.3 4.3  CL 100* 100* 100* 99* 101 99* 100*  CO2 _0 --  20*  GLUCOSE 70 120* 104* 68 98 119* 211*  BUN 46* 48* 49* 59* 59* 56* 68*  CREATININE 2.31* 2.22* 2.09* 2.68* 2.60* 2.60* 3.00*  CALCIUM 8.3* 8.1* 8.2* 8.2* 7.8*  --  8.1*  MG 2.1 2.0 2.0  --  1.8  --  1.9  PHOS 4.3 3.6 3.4  --  4.0  --  5.1*    Liver Function  Tests:  Recent Labs Lab 04/22/17 0339 04/23/17 0414 04/23/17 2225 04/24/17 0238 04/25/17 0452  AST _1 ALT 9* 8* 11* 9* 8*  ALKPHOS 90 95 88 86 95  BILITOT 1.5* 1.2 1.2 1.1 1.3*  PROT 5.4* 5.5* 5.9* 5.1* 5.7*  ALBUMIN 2.4* 2.4* 2.5* 2.3* 2.6*    Recent Labs Lab 04/23/17 2225  LIPASE 93*  AMYLASE 91   No results for input(s): AMMONIA in the last 168 hours.  CBC:  Recent Labs Lab 04/21/17 0334 04/22/17 0339 04/23/17 0414 04/24/17 0238 04/24/17 1919 04/25/17 0452  WBC 17.7* 14.8* 11.2* 12.4*  --  20.4*  NEUTROABS 15.4* 12.4* 8.7* 11.6*  --  18.6*  HGB 10.0* 9.6* 9.9* 10.1* 10.2* 11.0*  HCT 30.4* 30.1* 31.0* 31.9* 30.0* 33.4*  MCV 85.4 86.2 90.4 89.6  --  91.0  PLT 204 247 303 303  --  380    INR:  Recent Labs Lab 04/21/17 0334 04/22/17 0339 04/23/17 0414 04/24/17 0238 04/25/17 0452  INR 1.25 1.33 1.68 2.61 2.40    Other results:  LDH 219  Procalcitonin 4.6  LFT's normal  Albumin 2.6 and prealbumin < 5   Imaging: Dg Chest Port 1 View  Result Date: 04/25/2017 CLINICAL DATA:  56 year old male status post intubation EXAM: PORTABLE CHEST 1 VIEW COMPARISON:  Prior chest x-ray 04/24/2017 FINDINGS: The patient is intubated. The endotracheal tube is less than 1 cm above the carina. A gastric tube is present. The tip lies off the field of view, below the diaphragm and presumably within the stomach. Right upper extremity PICC. Catheter tip in good position at the superior cavoatrial junction. Left ventricular assist device is incompletely imaged. Patient is status post median sternotomy with evidence of prior multivessel CABG. Left subclavian approach intracardiac defibrillator with lead in unchanged position overlying the right ventricle. Stable cardiomegaly with left heart enlargement. Improved interstitial edema compared to the prior chest x-ray. Persistent low inspiratory volumes with bibasilar atelectasis. Surgical changes of prior right rotator  cuff repair. No acute osseous abnormality. 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 IMPRESSION: 1. The endotracheal tube is within 1 cm of the carina. Consider withdrawing 2-3 cm for more optimal position. Otherwise, stable and satisfactory support apparatus. 2. Improved pulmonary vascular congestion and pulmonary edema compared to yesterday. 3. Low inspiratory volumes with bibasilar atelectasis. Electronically Signed   By: Jacqulynn Cadet M.D.   On: 04/25/2017 07:44   Dg Chest Port 1 View  Result Date: 04/24/2017 CLINICAL DATA:  Acute respiratory failure EXAM: PORTABLE CHEST 1 VIEW COMPARISON:  Chest radiograph 04/23/2017 FINDINGS: Endotracheal tube tip is below the level of the clavicular heads. The carina is poorly visualized. Recommend retraction of the endotracheal tube by 2.5 cm. There is a right-sided PICC line with tip at the cavoatrial junction. There is left basilar consolidation and bilateral parahilar opacities. Unchanged appearance of left ventricular assist device. Enteric tube courses beyond the field of view. Unchanged cardiomegaly. IMPRESSION: 1. Endotracheal tube tip below the level of the clavicles, with poor visualization of the carina. 2.5 cm retraction recommended. 2. Mild-to-moderate pulmonary edema and left basilar consolidation, possibly atelectasis. Electronically Signed   By: Ulyses Jarred M.D.   On: 04/24/2017 02:54   Dg Chest Port 1 View  Result Date: 04/23/2017 CLINICAL DATA:  Vomiting. EXAM: PORTABLE CHEST 1 VIEW COMPARISON:  Chest radiographs 04/21/2017 FINDINGS: Previous right internal jugular sheath has been removed. Right upper extremity PICC is unchanged, tip at the atrial caval junction. Left ventricular assist device again seen. Left-sided pacemaker again seen. Very low lung volumes. Post median sternotomy. Grossly stable cardiomegaly. Linear left perihilar opacity is likely atelectasis. Increased right basilar  atelectasis. No pneumothorax. No convincing pulmonary edema. IMPRESSION: 1. Very low lung volumes with bibasilar atelectasis. Stable cardiomegaly. Left ventricular assist device again seen. 2. Tip of the right upper extremity PICC at the atrial caval junction. Electronically Signed   By: Jeb Levering M.D.   On: 04/23/2017 21:16   Dg Abd Portable 1v  Result Date: 04/23/2017 CLINICAL DATA:  Vomiting. EXAM: PORTABLE ABDOMEN - 1 VIEW COMPARISON:  Radiographs 04/18/2017, CTA 04/12/2017 FINDINGS: Marked gaseous gastric distension. Dilated small bowel throughout the abdomen measures up to 4.9 cm. No evidence of free air. Multiple overlying monitoring devices. Vascular calcifications. Left ventricular assist device partially included. IMPRESSION: Marked gaseous gastric distension nondilated small bowel, small bowel obstruction versus severe ileus. Electronically Signed   By: Jeb Levering M.D.   On: 04/23/2017 21:18      Medications:     Scheduled Medications: . aspirin EC  325  mg Oral Daily   Or  . aspirin  324 mg Per Tube Daily   Or  . aspirin  300 mg Rectal Daily  . bisacodyl  10 mg Oral Daily   Or  . bisacodyl  10 mg Rectal Daily  . chlorhexidine gluconate (MEDLINE KIT)  15 mL Mouth Rinse BID  . Chlorhexidine Gluconate Cloth  6 each Topical Daily  . fentaNYL (SUBLIMAZE) injection  50 mcg Intravenous Once  . insulin aspart  0-24 Units Subcutaneous Q4H  . mouth rinse  15 mL Mouth Rinse QID  . sodium chloride flush  10-40 mL Intracatheter Q12H  . sodium chloride flush  10-40 mL Intracatheter Q12H     Infusions: . Marland KitchenTPN (CLINIMIX-E) Adult 40 mL/hr at 04/24/17 2000  . sodium chloride Stopped (04/19/17 1200)  . sodium chloride Stopped (04/21/17 1500)  . amiodarone 30 mg/hr (04/25/17 0027)  . fentaNYL infusion INTRAVENOUS 40 mcg/hr (04/25/17 9629)  . lactated ringers    . lactated ringers 20 mL/hr at 04/24/17 2000  . norepinephrine (LEVOPHED) Adult infusion 7 mcg/min (04/25/17 0839)   . phenylephrine (NEO-SYNEPHRINE) Adult infusion Stopped (04/24/17 1239)  . piperacillin-tazobactam (ZOSYN)  IV Stopped (04/25/17 0747)  . vancomycin Stopped (04/25/17 0517)     PRN Medications:  albuterol, docusate, fentaNYL, hydrALAZINE, midazolam, midazolam, neomycin-bacitracin-polymyxin, ondansetron (ZOFRAN) IV, oxyCODONE, phenol, sodium chloride flush, sodium chloride flush, traMADol  Assessment/Plan/Discussion:    POD 8s/p HVAD for ischemic cardiomyopathy with acute on chronic systolic heart failure, EF 15% with moderate RV dysfunction. Returned to OR for bleeding from raw mediastinal tissues. Hemodynamics more stable. Wean levophed as tolerated to maintain MAP 70-85.  Acute respiratory failure due to aspiration. Intubated on vent. CCM following. Started on Vanc and Zosyn for possible aspiration pneumonia on CXR. CXR looks better this am and CCM planning to extubate. Sputum culture pending.  Ileus with large amount of NG output initially which looked like liquid stool. Abdomen is benign with reduced NG drainage. Still has hypoactive BS and likely ileus. Would keep NG in.  INR increased to 2.4. No coumadin while NPO. Will need heparin when he gets down to 2.  Stage 3 CKD: creat increased further today but making urine. Probably ATN from surgery and now volume loss into gut and hypotension. Continue supportive care. Need to watch vanc levels closely.  DM: preop Hgb A1c 5.9. Glucose under good control. ContinueLevemir and SSI.  Chronic anemia felt to be due to chronic disease. Hgb stable  Gout: observe  Hx of arrhythmias preop: on IV amiodarone.  ICD in place but will wait to turn on therapies.   Severe malnutrition with prealbumin < 5: On TNA while NPO.   I reviewed the LVAD parameters from today, and compared the results to the patient's prior recorded data. LVAD equipment check completed andis in good working order. Back-up equipment present.   I  discussed his clinical status with his wife at the bedside this am.    Length of Stay: [address]  Gaye Pollack 04/25/2017, 11:17 AM

## 2017-04-25 NOTE — Progress Notes (Signed)
Patient ID: James [name], male   DOB: 1960-11-09, 56 y.o.   MRN: [id number]   Advanced Heart Failure VAD Team Note  Subjective:    Initially admitted and aggressively diuresed.    To OR for HVAD placement 6/28.  Returned to the OR that evening with high chest tube output, evacuation of mediastinal hematoma.   Extubated 6/29.   Milrinone stopped on 7/4.  Evening of 7/4, patient developed nausea and vomiting.  KUB with ileus.  He developed increased dyspnea/tachypnea.  NGT placed with 3 L suctioned out.  CXR with suspicion for aspiration PNA.  Patient had to be intubated.  He went into atrial fibrillation with RVR.  He became hypotensive and was started on norepinephrine and phenylephrine.  Amiodarone gtt begun.     He is now off phenylephrine.  He is a-paced this morning. Remains on norepi at 5. MAP 70s.  Creatinine up to 3.0 this am from 2.6. CVP 8  Coox 70%. Tmax 98.9. INR 2.4. PCT up to 4.60. WBC up to 20.4. LDH stable at 219.  BCx pending. Resp Cx with rare GNR  Remains intubated. Nods to questioning. Denies pain in chest. No gout pain. Breathing ok on vent.   Creatinine 1.6-> 1.8 -> 2.1-> 2.31 -> 2.22 -> 2.09 -> 2.6. -> 3.0  Got 1 unit PRBCs on 7/2.   TNA begun 7/5.   HVAD INTERROGATION:  HVAD:  Flow 4.7 liters/min, speed 2820, power 4.9 W, peak 6.9/trough 3.5. 2 low flow alarms => per nurse 1 due to suctioning and the other due to cough  Objective:    Vital Signs:   Temp:  [97.2 F (36.2 C)-98.9 F (37.2 C)] 98.4 F (36.9 C) (07/06 0355) Pulse Rate:  [85-92] 91 (07/06 0500) Resp:  [14-20] 15 (07/06 0600) SpO2:  [98 %-100 %] 100 % (07/06 0500) Arterial Line BP: (70-96)/(59-90) 77/72 (07/06 0600) FiO2 (%):  [40 %-50 %] 40 % (07/06 0405) Weight:  [203 lb 0.7 oz (92.1 kg)] 203 lb 0.7 oz (92.1 kg) (07/06 0452) Last BM Date: 04/23/17 Mean arterial Pressure 74  Intake/Output:   Intake/Output Summary (Last 24 hours) at 04/25/17 0731 Last data filed at 04/25/17 0600  Gross per 24 hour  Intake          2819.43 ml  Output             2100 ml  Net           719.43 ml     Physical Exam    General: Intubated. Opens eyes. Follows commands.  HEENT: Normal. Anicteric Neck: supple. JVP 8 Cor: Strenal dressing C/D/I. + LVAD hum. Normal pump sounds.   Lungs: Dependent crackles Abdomen: Soft, NT, ND, + BS.  Extremities: Warm, trace ankle edema. RLE ulcer with dressing. + Diffuse severe gouty/topacheous changes.  Neuro: Opens eyes on vents. Follows commands.   Telemetry   Personally reviewed, A paced V sensed in 80s     Labs   Basic Metabolic Panel:  Recent Labs Lab 04/21/17 0334 04/22/17 0339 04/23/17 0414 04/23/17 2225 04/24/17 0238 04/24/17 1919 04/25/17 0452  NA 133* 132* 133* 132* 133* 134* 131*  K 4.3 3.8 3.9 4.7 4.6 4.3 4.3  CL 100* 100* 100* 99* 101 99* 100*  CO2 _0 --  20*  GLUCOSE 70 120* 104* 68 98 119* 211*  BUN 46* 48* 49* 59* 59* 56* 68*  CREATININE 2.31* 2.22* 2.09* 2.68* 2.60* 2.60* 3.00*  CALCIUM 8.3*  8.1* 8.2* 8.2* 7.8*  --  8.1*  MG 2.1 2.0 2.0  --  1.8  --  1.9  PHOS 4.3 3.6 3.4  --  4.0  --  5.1*    Liver Function Tests:  Recent Labs Lab 04/22/17 0339 04/23/17 0414 04/23/17 2225 04/24/17 0238 04/25/17 0452  AST _0 ALT 9* 8* 11* 9* 8*  ALKPHOS 90 95 88 86 95  BILITOT 1.5* 1.2 1.2 1.1 1.3*  PROT 5.4* 5.5* 5.9* 5.1* 5.7*  ALBUMIN 2.4* 2.4* 2.5* 2.3* 2.6*    Recent Labs Lab 04/23/17 2225  LIPASE 93*  AMYLASE 91   No results for input(s): AMMONIA in the last 168 hours.  CBC:  Recent Labs Lab 04/21/17 0334 04/22/17 0339 04/23/17 0414 04/24/17 0238 04/24/17 1919 04/25/17 0452  WBC 17.7* 14.8* 11.2* 12.4*  --  20.4*  NEUTROABS 15.4* 12.4* 8.7* 11.6*  --  18.6*  HGB 10.0* 9.6* 9.9* 10.1* 10.2* 11.0*  HCT 30.4* 30.1* 31.0* 31.9* 30.0* 33.4*  MCV 85.4 86.2 90.4 89.6  --  91.0  PLT 204 247 303 303  --  380    INR:  Recent Labs Lab 04/21/17 0334 04/22/17 0339  04/23/17 0414 04/24/17 0238 04/25/17 0452  INR 1.25 1.33 1.68 2.61 2.40    Other results:     Imaging   Dg Chest Port 1 View  Result Date: 04/24/2017 CLINICAL DATA:  Acute respiratory failure EXAM: PORTABLE CHEST 1 VIEW COMPARISON:  Chest radiograph 04/23/2017 FINDINGS: Endotracheal tube tip is below the level of the clavicular heads. The carina is poorly visualized. Recommend retraction of the endotracheal tube by 2.5 cm. There is a right-sided PICC line with tip at the cavoatrial junction. There is left basilar consolidation and bilateral parahilar opacities. Unchanged appearance of left ventricular assist device. Enteric tube courses beyond the field of view. Unchanged cardiomegaly. IMPRESSION: 1. Endotracheal tube tip below the level of the clavicles, with poor visualization of the carina. 2.5 cm retraction recommended. 2. Mild-to-moderate pulmonary edema and left basilar consolidation, possibly atelectasis. Electronically Signed   By: Ulyses Jarred M.D.   On: 04/24/2017 02:54   Dg Chest Port 1 View  Result Date: 04/23/2017 CLINICAL DATA:  Vomiting. EXAM: PORTABLE CHEST 1 VIEW COMPARISON:  Chest radiographs 04/21/2017 FINDINGS: Previous right internal jugular sheath has been removed. Right upper extremity PICC is unchanged, tip at the atrial caval junction. Left ventricular assist device again seen. Left-sided pacemaker again seen. Very low lung volumes. Post median sternotomy. Grossly stable cardiomegaly. Linear left perihilar opacity is likely atelectasis. Increased right basilar atelectasis. No pneumothorax. No convincing pulmonary edema. IMPRESSION: 1. Very low lung volumes with bibasilar atelectasis. Stable cardiomegaly. Left ventricular assist device again seen. 2. Tip of the right upper extremity PICC at the atrial caval junction. Electronically Signed   By: Jeb Levering M.D.   On: 04/23/2017 21:16   Dg Abd Portable 1v  Result Date: 04/23/2017 CLINICAL DATA:  Vomiting. EXAM:  PORTABLE ABDOMEN - 1 VIEW COMPARISON:  Radiographs 04/18/2017, CTA 04/12/2017 FINDINGS: Marked gaseous gastric distension. Dilated small bowel throughout the abdomen measures up to 4.9 cm. No evidence of free air. Multiple overlying monitoring devices. Vascular calcifications. Left ventricular assist device partially included. IMPRESSION: Marked gaseous gastric distension nondilated small bowel, small bowel obstruction versus severe ileus. Electronically Signed   By: Jeb Levering M.D.   On: 04/23/2017 21:18     Medications:     Scheduled Medications: . aspirin EC  325 mg  Oral Daily   Or  . aspirin  324 mg Per Tube Daily   Or  . aspirin  300 mg Rectal Daily  . bisacodyl  10 mg Oral Daily   Or  . bisacodyl  10 mg Rectal Daily  . chlorhexidine gluconate (MEDLINE KIT)  15 mL Mouth Rinse BID  . Chlorhexidine Gluconate Cloth  6 each Topical Daily  . fentaNYL (SUBLIMAZE) injection  50 mcg Intravenous Once  . insulin aspart  0-24 Units Subcutaneous Q4H  . mouth rinse  15 mL Mouth Rinse QID  . sodium chloride flush  10-40 mL Intracatheter Q12H  . sodium chloride flush  10-40 mL Intracatheter Q12H    Infusions: . Marland KitchenTPN (CLINIMIX-E) Adult 40 mL/hr at 04/24/17 2000  . sodium chloride Stopped (04/19/17 1200)  . sodium chloride Stopped (04/21/17 1500)  . amiodarone 30 mg/hr (04/25/17 0027)  . fentaNYL infusion INTRAVENOUS 40 mcg/hr (04/25/17 4540)  . lactated ringers    . lactated ringers 20 mL/hr at 04/24/17 2000  . norepinephrine (LEVOPHED) Adult infusion 5 mcg/min (04/24/17 2100)  . phenylephrine (NEO-SYNEPHRINE) Adult infusion Stopped (04/24/17 1239)  . piperacillin-tazobactam (ZOSYN)  IV 3.375 g (04/25/17 0347)  . vancomycin Stopped (04/25/17 0517)    PRN Medications: albuterol, docusate, fentaNYL, hydrALAZINE, midazolam, midazolam, neomycin-bacitracin-polymyxin, ondansetron (ZOFRAN) IV, oxyCODONE, phenol, sodium chloride flush, sodium chloride flush, traMADol   Patient Profile     56 yo with CAD s/p CABG, ischemic cardiomyopathy/chronic systolic CHF, tophaceous gout, and CKD stage 3 was admitted for diuresis and consideration for LVAD placement. S/p HVAD on 6/28  Assessment/Plan:    1. Acute/chronic systolic CHF: Ischemic cardiomyopathy.  St Jude ICD.  Echo (6/18) with EF 15%, mildly dilated RV with moderately decreased systolic function.  RHC 6/22 with elevated filling pressures and evidence of RV failure though PAPi score 1.95 so not prohibitive.  He has been seen at [facility] and turned down for transplant with severe tophaceous gout and concern for worsening on transplant meds.  He was diuresed extensively, repeat RHC 6/26 with marked improvement in filling pressures and stable cardiac output on milrinone.  HVAD placement 6/28 and had to return to OR to evacuate mediastinal hematoma.  He had been weaned off pressors and milrinone, but developed ileus and likely aspiration event yesterday, re-intubated, developed afib/RVR, and now back on norepinephrine (phenylephrine stopped yesterday). - CVP 8. Coox 70.0%.   - Keep speed at 2820 rpm.  - No Lasix today.  - MAP 70s, can wean norepinephrine as above today.   - On ASA and warfarin, INR 2.4 - Holding digoxin with rise in creatinine.    2. AKI on CKD stage 3: Suspect a component of peri-op ATN, worsened yesterday with ileus, vomiting.  - Creatinine up to 3.0. CVP 8 - Holding lasix.   3. Anemia: Chronic, seen by GI pre-op, they did not feel that he needed endoscopy.  FOBT negative. He had 1 unit PRBCs 7/1 with appropriate bump.  Hgb stable at 11.0 today.    4. CAD: s/p CABG 2012.   - Stable.    5. Gout: Severe tophaceous gout. Controlled today.  - On uloric and daily colchicine. Limited in ability to give with npo status.     6. Atrial fibrillation: Developed atrial fibrillation with RVR in setting of aspiration PNA yesterday.  Amiodarone gtt begun, now out of atrial fibrillation (a-paced).     7. Malnutrition: Major  issue with baseline poor nutritional status and now with ileus and intubated again.   -  Nutrition following, now getting TNA.    8. Suspect aspiration PNA with acute hypoxemic respiratory failure:  - CXR this am with improving edema and congestion. Low lung volumes with bibasilar atelectasis. (Viewed personally)  - He has ileus and vomited copiously, NG suction with 3 L out 04/24/17 - Elevated PCT (up to 4.6)  - Continue vancomycin/Zosyn. - CCM following for vent.   9. Ileus: Vomiting on 7/4 with aspiration PNA.   - Lactate normal yesterday.  - Watching closely for signs of ischemic bowel => he denies abdominal pain and abdomen is soft.   - 500 cc of output from NG tube overnight. (brown and foul smelling)  10. Deconditioning - Will mobilize once able. ? If will qualify for stay in CIR once improved.   Remains tenuous. Sending HVAD interrogation to company. Continue to follow closely. Remains on broad spectrum ABX.   I reviewed the HVAD parameters from today, and compared the results to the patient's prior recorded data.  No programming changes were made.  The HVAD is functioning within specified parameters.     Shirley Friar, PA-C 04/25/2017, 7:31 AM  VAD Team --- VAD ISSUES ONLY--- Pager [phone] (7am - 7am)  Advanced Heart Failure Team  Pager [phone] (M-F; 7a - 4p)  Please contact [facility] for night-coverage after hours (4p -7a ) and weekends on [email or web address]  Patient seen with PA and independently reviewed data and examined patient, agree with the above note.    I reviewed HVAD parameters => 2 low flow alarms correlated with suctioning and coughing.   Ileus, still with copious NG output.  - Malnutrition: getting TNA.   MAP stable in 70s, weaning norepinephrine today.   Creatinine up to 3, hypotensive with aspiration event/intubation and required pressors again.  Now more stable.  Hopefully will start to trend down.   S/p aspiration event, now on  vancomycin/Zosyn with elevated PCT and WBCs.  No culture data yet.  May be able to extubate soon.  CXR reviewed and improved.   He remains a-paced on amiodarone gtt.   CRITICAL CARE Performed by: Loralie Champagne  Total critical care time: 35 minutes  Critical care time was exclusive of separately billable procedures and treating other patients.  Critical care was necessary to treat or prevent imminent or life-threatening deterioration.  Critical care was time spent personally by me on the following activities: development of treatment plan with patient and/or surrogate as well as nursing, discussions with consultants, evaluation of patient's response to treatment, examination of patient, obtaining history from patient or surrogate, ordering and performing treatments and interventions, ordering and review of laboratory studies, ordering and review of radiographic studies, pulse oximetry and re-evaluation of patient's condition.  Loralie Champagne 04/25/2017 8:22 AM

## 2017-04-25 NOTE — Progress Notes (Signed)
PULMONARY / CRITICAL CARE MEDICINE   Name: James [name] MRN: [id number] DOB: 1961-08-09    ADMISSION DATE:  04/19/2017 CONSULTATION DATE:  04/23/2017  REFERRING MD:  Laneta SimmersBartle - CVTS  CHIEF COMPLAINT:  Acute respiratory failure  HISTORY OF PRESENT ILLNESS:   [name] with CHF (EF 15%), AICD, CAD, Gout, HTN, CKD, DM, Anemia, admitted 04/05/2017 for VAD placement, as he had already been turned down by [facility] for transplant due to his gout and limitations with his mobility. Today patient is POD 7 s/p HVAD placement for his ischemic cardiomyopathy. His course has been complicated by AKI-on-CKD, and also complicated by pre-op Afib with RVR. Earlier this evening he began vomiting feculent material and was found to have an ileus. An NG tube was placed to suction. However, patient then developed acute hypoxic respiratory failure requiring 15L O2 NRB, and he developed worsening respiratory distress, prompting ICU consult.   When I first examined patient, his RR was 40 and lung auscultation revealed rhonchi. He appeared very weak and eyes rolling back in his head. Automated BP wouldn't pick up; manual SBP 80. RT attempted ABG but was unable. Out of concern for impending respiratory failure and cardiac arrest, decision was made to go ahead and intubate.   SUBJECTIVE:  No events overnight, pressor need decreased and more alert and interactive   VITAL SIGNS: BP (!) 63/59 (BP Location: Left Arm)   Pulse 86   Temp 98.4 F (36.9 C) (Oral)   Resp 14   Ht 6' (1.829 m)   Wt 92.1 kg (203 lb 0.7 oz)   SpO2 100%   BMI 27.54 kg/m   HEMODYNAMICS: CVP:  [6 mmHg-10 mmHg] 10 mmHg  VENTILATOR SETTINGS: Vent Mode: PRVC FiO2 (%):  [40 %-50 %] 40 % Set Rate:  [14 bmp] 14 bmp Vt Set:  [620 mL] 620 mL PEEP:  [5 cmH20] 5 cmH20 Plateau Pressure:  [17 cmH20-21 cmH20] 19 cmH20  INTAKE / OUTPUT: I/O last 3 completed shifts: In: 4846.6 [I.V.:3316.6; NG/GT:30; IV Piggyback:1500] Out: 6045 [Urine:1345; Emesis/NG  output:4700]  PHYSICAL EXAMINATION: General:  Acute on chronically ill appearing male, awake and interactive Neuro:  Awake, following commands, NAD HEENT:  [place]/AT, PERRL, EOM-I and MMM Cardiovascular:  RRR, LVAD sounds heard, difficult to hear heart valve over LVAD Lungs:  CTA bilaterally Abdomen:  Soft, NT, ND and +BS Musculoskeletal:  -edema and -tenderness, joint deformities noted Skin:  Intact  LABS:  BMET  Recent Labs Lab 04/23/17 2225 04/24/17 0238 04/24/17 1919 04/25/17 0452  NA 132* 133* 134* 131*  K 4.7 4.6 4.3 4.3  CL 99* 101 99* 100*  CO2 23 23  --  20*  BUN 59* 59* 56* 68*  CREATININE 2.68* 2.60* 2.60* 3.00*  GLUCOSE 68 98 119* 211*   Electrolytes  Recent Labs Lab 04/23/17 0414 04/23/17 2225 04/24/17 0238 04/25/17 0452  CALCIUM 8.2* 8.2* 7.8* 8.1*  MG 2.0  --  1.8 1.9  PHOS 3.4  --  4.0 5.1*   CBC  Recent Labs Lab 04/23/17 0414 04/24/17 0238 04/24/17 1919 04/25/17 0452  WBC 11.2* 12.4*  --  20.4*  HGB 9.9* 10.1* 10.2* 11.0*  HCT 31.0* 31.9* 30.0* 33.4*  PLT 303 303  --  380   Coag's  Recent Labs Lab 04/23/17 0414 04/24/17 0238 04/25/17 0452  APTT  --  51*  --   INR 1.68 2.61 2.40   Sepsis Markers  Recent Labs Lab 04/24/17 0238 04/24/17 0550 04/25/17 0452  LATICACIDVEN 2.5* 1.5  --  PROCALCITON 1.96  --  4.60   ABG  Recent Labs Lab 04/24/17 0642 04/24/17 1853 04/25/17 0358  PHART 7.496* 7.479* 7.375  PCO2ART 30.3* 31.7* 35.8  PO2ART 115.0* 135.0* 115*   Liver Enzymes  Recent Labs Lab 04/23/17 2225 04/24/17 0238 04/25/17 0452  AST 24 22 23   ALT 11* 9* 8*  ALKPHOS 88 86 95  BILITOT 1.2 1.1 1.3*  ALBUMIN 2.5* 2.3* 2.6*    Cardiac Enzymes  Recent Labs Lab 04/24/17 0238  TROPONINI 2.11*    Glucose  Recent Labs Lab 04/24/17 1314 04/24/17 1640 04/24/17 2321 04/24/17 2323 04/25/17 0346 04/25/17 0800  GLUCAP 77 106* 580* 179* 175* 177*    Imaging Dg Chest Port 1 View  Result Date:  04/25/2017 CLINICAL DATA:  56 year old male status post intubation EXAM: PORTABLE CHEST 1 VIEW COMPARISON:  Prior chest x-ray 04/24/2017 FINDINGS: The patient is intubated. The endotracheal tube is less than 1 cm above the carina. A gastric tube is present. The tip lies off the field of view, below the diaphragm and presumably within the stomach. Right upper extremity PICC. Catheter tip in good position at the superior cavoatrial junction. Left ventricular assist device is incompletely imaged. Patient is status post median sternotomy with evidence of prior multivessel CABG. Left subclavian approach intracardiac defibrillator with lead in unchanged position overlying the right ventricle. Stable cardiomegaly with left heart enlargement. Improved interstitial edema compared to the prior chest x-ray. Persistent low inspiratory volumes with bibasilar atelectasis. Surgical changes of prior right rotator cuff repair. No acute osseous abnormality. 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 IMPRESSION: 1. The endotracheal tube is within 1 cm of the carina. Consider withdrawing 2-3 cm for more optimal position. Otherwise, stable and satisfactory support apparatus. 2. Improved pulmonary vascular congestion and pulmonary edema compared to yesterday. 3. Low inspiratory volumes with bibasilar atelectasis. Electronically Signed   By: Malachy Moan M.D.   On: 04/25/2017 07:44     STUDIES:  None  CULTURES: Blood 7/5>>> Sputum 7/5>>>  ANTIBIOTICS: Vancomycin 7/5>>> Zosyn 7/5>>>  SIGNIFICANT EVENTS: 7/5 respiratory arrest post feculent vomitus episode  LINES/TUBES: ETT 7/5>>> Double lumen PICC on the right 7/2>>>  ASSESSMENT / PLAN: [name] with CHF (EF 15%), AICD, CAD, Gout, HTN, CKD, DM, Anemia, admitted 04/18/2017 for VAD placement, now POD 7 s/p HVAD placement, with course complicated by AKI-on-CKD and pre-op Afib with RVR. Overnight he developed feculent  vomiting related to an ileus. And now he developed acute hypoxic respiratory failure with severe respiratory distress due to aspiration pneumonia, requiring intubation, with associated development of septic shock.  PULMONARY 1. Acute hypoxic respiratory failure; Aspiration pneumonia: - Begin PS trials, if does well may consider extubation today - Continue GI and DVT prophylaxis - NPO; NG tube to suction - TPN per pharmacy - Abx as below - Fentanyl gtt; versed PRN for sedation - Hold diureses today given renal function  CARDIOVASCULAR 1. CHF (EF 15%); CAD; History HTN - Continue HVAD; CT Surg and Cards to manage - Holding antihypertensive medications in setting of shock - D/C neo, continue levo and titrate as able - LVAD  2. Afib with RVR: - Amiodarone gtt - Correct mag  RENAL 1. AKI-on-CKD: Cr worsening - Hold further diureses - Replace electrolytes as indicated - BMET in AM - KVO IVF  GASTROINTESTINAL 1. Ileus vs SBO: - Seen on KUB - NG tube to suction - NPO - TPN per pharmacy  HEMATOLOGIC 1. Anemia: - CBC in AM -  Transfuse per ICU protocol  INFECTIOUS 1. Septic Shock: - Wean neo off as able, keep levo - Pan-culture - Procalcitonin 1.96 - Vanc and Zosyn - Follow CVP, currently 6.  ENDOCRINE 1. DM: - Currently NPO; continue accuchecks and SSI  NEUROLOGIC 1. Acute encephalopathy: due to acute illness - Titrate sedation for RASS -1  FAMILY  - Updates: No family bedside 7/6.  - Inter-disciplinary family meet or Palliative Care meeting due by:  Palliative care following  The patient is critically ill with multiple organ systems failure and requires high complexity decision making for assessment and support, frequent evaluation and titration of therapies, application of advanced monitoring technologies and extensive interpretation of multiple databases.   Critical Care Time devoted to patient care services described in this note is  35  Minutes.  This time reflects time of care of this signee Dr Koren Bound. This critical care time does not reflect procedure time, or teaching time or supervisory time of PA/NP/Med student/Med Resident etc but could involve care discussion time.  Alyson Reedy, M.D. [facility] Pulmonary/Critical Care Medicine. Pager: [phone]. After hours pager: [phone].  04/25/2017, 9:48 AM

## 2017-04-25 NOTE — Progress Notes (Signed)
PT Cancellation Note  Patient Details Name: James [name] MRN: [id number] DOB: 08/19/1961   Cancelled Treatment:    Reason Eval/Treat Not Completed: Medical issues which prohibited therapy (Pt still on vent with plans to extubate later today. )Nurse asked PT to check back tomorrow.  Thanks.     James GarnetDawn F [name] 04/25/2017, 12:28 PM  James [name],PT Acute Rehabilitation [phone]

## 2017-04-25 NOTE — Progress Notes (Signed)
PHARMACY - ADULT TOTAL PARENTERAL NUTRITION CONSULT NOTE    Lytes removed from TPN today and BMET was rechecked tonight.  Mag sulfate 1gm IV given this AM.  Patient is receiving PRN Lasix, last dose yesterday.  K+ trended down despite worsening renal function.  Oliguric.    Plan: - KCL x 2 runs - F/U AM labs    James [name] D. Laney Potashang, PharmD, BCPS Pager:  [phone] 04/25/2017, 9:25 PM

## 2017-04-25 NOTE — Progress Notes (Signed)
OT Cancellation Note  Patient Details Name: Edd FabianHarry D Knudsen MRN: [id number] DOB: 01/21/1961   Cancelled Treatment:    Reason Eval/Treat Not Completed: Medical issues which prohibited therapy.  Pt just extubated.  Will reattempt.  Leniyah Martell Antononarpe, OTR/L [phone]   [name], Josephina Melcher M 04/25/2017, 12:38 PM

## 2017-04-25 NOTE — Procedures (Signed)
Extubation Procedure Note  Patient Details:   Name: James FabianHarry D Lacek DOB: [birth year] MRN: [id number]   Airway Documentation:     Evaluation  O2 sats: stable throughout Complications: No apparent complications Patient did tolerate procedure well. Bilateral Breath Sounds: Diminished   Yes  Patient tolerated wean. MD ordered to extubate. Positive for cuff leak. Patient extubated to a 4 Lpm [place]. Patient instructed on the Incentive Spirometer achieving 600 mL five times. Patient resting comfortably. Family and RN at bedside.   Ancil BoozerSmallwood, Shawnte Demarest 04/25/2017, 12:20 PM

## 2017-04-25 NOTE — Progress Notes (Signed)
LVAD Inpatient Coordinator Rounding Note:  Admitted 04/03/2017 due to A/C Heart failure.   [employer] LVAD implanted on 06/11/2017 by Dr. Laneta SimmersBartle as DT VAD.  Vital signs: HR: 85 (atrial paced) Doppler Pressure: 74 A-Line: 85/77 (80) O2 Sat:  100 on 40% vent Wt:207>209 > 213 >213 > 208>203   LVAD interrogation reveals:  Speed:  2820 Flow:  4.6 Power:  4.9 Alarms: Low Flow on 04/25/17 at 3:58 and 4:19 am Peak: 6.9 Trough: 3.8 HCT: 32 - increased to 33 Low flow alarm setting: 3.0 High watt alarm setting:   7.0  Suction:  Turned on Lavare cycle:  on  Blood Products: 6/28> 5 PRBC's, 6 FFP 7/1> 1 PRBC  Gtts: Milrinone stopped 04/23/17 Levo 5 mcg/min Amiodarone 30 mg/hr  TPN - started 04/24/17 for nutritional support  Arrhythmia: 04/24/17 - Afib with RVR - started amiodarone  Respiratory: 04/24/17 - re-intubated due to respiratory failure secondary to suspected aspiration pneumonia  Drive Line: Daily Dressing Kits with Aquacell AG silver strips per protocol.   Labs:  LDH trend:160 (pre VAD)>227>215>191>212>207>222>217>219  INR trend: 1.31>1.45>1.44>1.25>1.33>1.68>2.61>2.4  Anticoagulation Plan: -INR Goal: 2-2.5 started 04/19/17 -ASA Dose: 325 mg  Adverse Events on VAD: -Return to OR 6/28  with high chest tube output, evacuation of mediastinal hematoma -Ileus with vomiting on 04/23/17; re-intubation for acute respiratory failure;  probable aspiration pneumonia  Plan/Recommendations:  1. Will hold VAD teaching until patient stabilizes.  2. Continue daily dressing changes.    Hessie DienerMolly Eylin Pontarelli RN, VAD Coordinator 24/7 pager [phone]

## 2017-04-25 NOTE — Progress Notes (Signed)
PHARMACY - ADULT TOTAL PARENTERAL NUTRITION CONSULT NOTE   Pharmacy Consult for TPN Indication: Ileus and poor baseline nutritional status  Patient Measurements: Height: 6' (182.9 cm) Weight: 203 lb 0.7 oz (92.1 kg) IBW/kg (Calculated) : 77.6 TPN AdjBW (KG): 96.9 Body mass index is 27.54 kg/m.  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 Patient started on enteral soft diet 7/1 but decompensated overnight 7/4 requiring re-intubation and found to have ileus after vomiting fecal material. NGT was placed and 3000 ml suctioned. Pharmacy consulted to start TPN.   GI: New ileus with severe malnutrition at baseline. Abdomen distended. NGT output down 900 ml yesterday. Pre-albumin remains <5. Albumin 2.6.  Reglan 10 IV q6h x3 doses. Pepcid 20 IV daily. LBM was 7/4. Endo: CBG rising (119 to 211) -580 in error?.  Insulin requirements in the past 24 hours: 8 units SSI (Detemir discontinued -last dose 7/4).  Lytes: Na low 131, trending down (has been low for past 5 days, 136 on admit). K 4.3, Phos 4>>5.1 with TPN. Mg 1.9 (goal >2 with ileus). Caution with replacement in worsening AKI. CoCa 9.1.  Renal: AKI on CKD. SCr up 2.6 >>3. BUN up 68. UOP low 0.2 cc/kg/hr. -2L last 24hrs (-6L for admission). S/p Lasix 60mg  IV x1 on 7/5 AM. No further Lasix per HF team and CCM today.  Pulm: Vent- PRVC, FiO2 50% Cards: CHF (EF 15%) w/ LVAD, CAD, HTN, Afib- Hypotensive requiring pressors (Levophed @ 7, Phenylephrine off, VP). Milrinone is currently off. Amio drip @ 30 mg/hr. HR 80s. CVP 10. Coox 70%. Hepatobil: LFTs wnl, Tbili increased at 1.3. TG 78 Neuro: Fentanyl drip at 40 mcg/hr + prn Midazolam. RASS -1. GCS 12.  Heme/AC: warfarin for atrial fibrillation initiated 7/4. Held last night due to NPO status. Hgb 11, Plts 380. INR 2.40.  ID: Initiated on empiric vancomycin and Zosyn day #2. WBC up 20.4,  afebrile. LA down from 2.5 to 1.5. PCT up 4.60. MRSA pcr positive. Resp culture with rare GNR.   Best Practices: Warfarin (change to IV heparin/Lovenox while NPO?) TPN Access: PICC 7/2 TPN start date: 7/5>>  Nutritional Goals (per RD recommendation on 7/5): KCal: 2200 Protein: 120-140 gm  Goal with lipids: Clinimix E 5/15 at 105 ml/hr with IV lipid emulsions (126g of protein and 2269 kcal per day)  Current Nutrition:  NPO  Plan:  Change to Clinimix 5/15 this PM and keep at 6740ml/hr - no electrolytes due to elevated Phos Hold 20% lipid emulsion for first 7 days for ICU patients per ASPEN guidelines (Start date 7/12) [facility]. This provides 48 g of protein and 681 kCals per day meeting 40% of protein and 31% of kCal needs Add MVI in TPN daily Add trace elements in TPN every other day, next 7/5 Continue TCTS q4h SSI  Add Pepcid 20 mg IV in TPN bag. Monitor TPN labs Watch Tbili  Will recheck BMET this PM due to need to remove lytes from TPN.  Link SnufferJessica Vika Buske, PharmD, BCPS Clinical Pharmacist Clinical Phone 04/25/2017 until 3:30 PM - [phone] After hours, please call #[phone] 04/25/2017,8:45 AM

## 2017-04-26 ENCOUNTER — Inpatient Hospital Stay (HOSPITAL_COMMUNITY): Payer: Medicare PPO

## 2017-04-26 DIAGNOSIS — J9621 Acute and chronic respiratory failure with hypoxia: Secondary | ICD-10-CM

## 2017-04-26 LAB — BASIC METABOLIC PANEL
Anion gap: 11 (ref 5–15)
BUN: 72 mg/dL — ABNORMAL HIGH (ref 6–20)
CHLORIDE: 99 mmol/L — AB (ref 101–111)
CO2: 19 mmol/L — AB (ref 22–32)
CREATININE: 3.33 mg/dL — AB (ref 0.61–1.24)
Calcium: 7.9 mg/dL — ABNORMAL LOW (ref 8.9–10.3)
GFR calc non Af Amer: 19 mL/min — ABNORMAL LOW (ref >60)
GFR, EST AFRICAN AMERICAN: 23 mL/min — AB (ref >60)
Glucose, Bld: 194 mg/dL — ABNORMAL HIGH (ref 65–99)
Potassium: 3.5 mmol/L (ref 3.5–5.1)
Sodium: 129 mmol/L — ABNORMAL LOW (ref 135–145)

## 2017-04-26 LAB — COMPREHENSIVE METABOLIC PANEL
ALBUMIN: 2.3 g/dL — AB (ref 3.5–5.0)
ALK PHOS: 78 U/L (ref 38–126)
ALT: 8 U/L — AB (ref 17–63)
AST: 18 U/L (ref 15–41)
Anion gap: 10 (ref 5–15)
BILIRUBIN TOTAL: 1.1 mg/dL (ref 0.3–1.2)
BUN: 71 mg/dL — AB (ref 6–20)
CALCIUM: 7.9 mg/dL — AB (ref 8.9–10.3)
CO2: 21 mmol/L — ABNORMAL LOW (ref 22–32)
CREATININE: 3.26 mg/dL — AB (ref 0.61–1.24)
Chloride: 101 mmol/L (ref 101–111)
GFR calc Af Amer: 23 mL/min — ABNORMAL LOW (ref >60)
GFR calc non Af Amer: 20 mL/min — ABNORMAL LOW (ref >60)
GLUCOSE: 170 mg/dL — AB (ref 65–99)
Potassium: 4 mmol/L (ref 3.5–5.1)
Sodium: 132 mmol/L — ABNORMAL LOW (ref 135–145)
Total Protein: 5.2 g/dL — ABNORMAL LOW (ref 6.5–8.1)

## 2017-04-26 LAB — BLOOD GAS, ARTERIAL
ACID-BASE DEFICIT: 4.5 mmol/L — AB (ref 0.0–2.0)
BICARBONATE: 20.9 mmol/L (ref 20.0–28.0)
DRAWN BY: 252031
O2 CONTENT: 4 L/min
O2 SAT: 98.8 %
PATIENT TEMPERATURE: 98.6
pCO2 arterial: 44.2 mmHg (ref 32.0–48.0)
pH, Arterial: 7.296 — ABNORMAL LOW (ref 7.350–7.450)
pO2, Arterial: 140 mmHg — ABNORMAL HIGH (ref 83.0–108.0)

## 2017-04-26 LAB — PROTIME-INR
INR: 2.17
PROTHROMBIN TIME: 24.6 s — AB (ref 11.4–15.2)

## 2017-04-26 LAB — MAGNESIUM
Magnesium: 2 mg/dL (ref 1.7–2.4)
Magnesium: 2.1 mg/dL (ref 1.7–2.4)

## 2017-04-26 LAB — CBC
HCT: 27.2 % — ABNORMAL LOW (ref 39.0–52.0)
Hemoglobin: 8.8 g/dL — ABNORMAL LOW (ref 13.0–17.0)
MCH: 29.3 pg (ref 26.0–34.0)
MCHC: 32.4 g/dL (ref 30.0–36.0)
MCV: 90.7 fL (ref 78.0–100.0)
PLATELETS: 355 10*3/uL (ref 150–400)
RBC: 3 MIL/uL — ABNORMAL LOW (ref 4.22–5.81)
RDW: 18.7 % — ABNORMAL HIGH (ref 11.5–15.5)
WBC: 22.4 10*3/uL — ABNORMAL HIGH (ref 4.0–10.5)

## 2017-04-26 LAB — GLUCOSE, CAPILLARY
GLUCOSE-CAPILLARY: 174 mg/dL — AB (ref 65–99)
GLUCOSE-CAPILLARY: 189 mg/dL — AB (ref 65–99)
GLUCOSE-CAPILLARY: 179 mg/dL — AB (ref 65–99)
Glucose-Capillary: 175 mg/dL — ABNORMAL HIGH (ref 65–99)
Glucose-Capillary: 180 mg/dL — ABNORMAL HIGH (ref 65–99)
Glucose-Capillary: 192 mg/dL — ABNORMAL HIGH (ref 65–99)
Glucose-Capillary: 163 mg/dL — ABNORMAL HIGH (ref 65–99)

## 2017-04-26 LAB — PROCALCITONIN: Procalcitonin: 3.72 ng/mL

## 2017-04-26 LAB — PHOSPHORUS: PHOSPHORUS: 4.8 mg/dL — AB (ref 2.5–4.6)

## 2017-04-26 LAB — LACTATE DEHYDROGENASE: LDH: 171 U/L (ref 98–192)

## 2017-04-26 MED ORDER — WARFARIN SODIUM 2.5 MG PO TABS
2.5000 mg | ORAL_TABLET | Freq: Once | ORAL | Status: AC
  Administered 2017-04-26: 2.5 mg via ORAL
  Filled 2017-04-26: qty 1

## 2017-04-26 MED ORDER — WARFARIN - PHYSICIAN DOSING INPATIENT: Freq: Every day | Status: DC

## 2017-04-26 MED ORDER — SODIUM CHLORIDE 0.9% FLUSH: 3.0000 mL | INTRAVENOUS | Status: DC | PRN

## 2017-04-26 MED ORDER — FUROSEMIDE 10 MG/ML IJ SOLN
60.0000 mg | Freq: Once | INTRAMUSCULAR | Status: AC
  Administered 2017-04-26: 60 mg via INTRAVENOUS
  Filled 2017-04-26: qty 6

## 2017-04-26 MED ORDER — SODIUM CHLORIDE 0.9% FLUSH
3.0000 mL | Freq: Two times a day (BID) | INTRAVENOUS | Status: DC
  Administered 2017-04-27 – 2017-05-01 (×4): 3 mL via INTRAVENOUS
  Administered 2017-05-02: 10 mL via INTRAVENOUS
  Administered 2017-05-03 – 2017-05-10 (×3): 3 mL via INTRAVENOUS

## 2017-04-26 MED ORDER — POTASSIUM CHLORIDE 10 MEQ/50ML IV SOLN
10.0000 meq | INTRAVENOUS | Status: AC
  Administered 2017-04-26 – 2017-04-27 (×4): 10 meq via INTRAVENOUS
  Filled 2017-04-26 (×4): qty 50

## 2017-04-26 MED ORDER — TRACE MINERALS CR-CU-MN-SE-ZN 10-1000-500-60 MCG/ML IV SOLN
INTRAVENOUS | Status: AC
  Administered 2017-04-26: 18:00:00 via INTRAVENOUS
  Filled 2017-04-26: qty 1440

## 2017-04-26 MED ORDER — ALBUMIN HUMAN 25 % IV SOLN: 12.5000 g | Freq: Four times a day (QID) | INTRAVENOUS | Status: DC

## 2017-04-26 MED ORDER — PHENOL 1.4 % MT LIQD
1.0000 | OROMUCOSAL | Status: DC | PRN
  Administered 2017-04-26: 1 via OROMUCOSAL

## 2017-04-26 MED ORDER — ALBUMIN HUMAN 25 % IV SOLN
12.5000 g | Freq: Four times a day (QID) | INTRAVENOUS | Status: AC
  Administered 2017-04-26 – 2017-04-27 (×3): 12.5 g via INTRAVENOUS
  Filled 2017-04-26 (×3): qty 50

## 2017-04-26 NOTE — Progress Notes (Signed)
9 Days Post-Op Procedure(s) (LRB): MEDIASTINAL EXPLORATION (N/A) Subjective: Colonic and small bowel ileus with vomiting and aspiration, now extubated, NG tube with trace output over the past shift. Abdomen soft nondistended  KUB shows dilated loops of colon, chest x-ray with low lung volumes  Wet cough with heavy airway secretions-nasotracheal suctioning performed with removal of specimen cup of thick mucoid material sent for culture  Hemodynamics stable overnight on minimal dose of norepinephrine maps maintained greater than 65 Co-ox greater than 72%, good pulsatility on LVAD  Urine output 20-30 cc per hour but starting to improve, acute on chronic renal failure with creatinine plateaued at 3.25  INR drifting down to 2.1-we'll start low-dose Coumadin this evening.  Patient on Zosyn and vancomycin for aspiration pneumonia, will check vancomycin level before next dose  Patient needs pulmonary toilet and mobilization out of bed and chair today Continue T NAfor severe protein malnutrition-prealbumin less than 5  Objective: Vital signs in last 24 hours: Temp:  [97.9 F (36.6 C)-98.7 F (37.1 C)] 98.7 F (37.1 C) (07/07 0400) Pulse Rate:  [68-87] 86 (07/07 0600) Cardiac Rhythm: Atrial paced (07/07 0400) Resp:  [14-27] 20 (07/07 0600) SpO2:  [97 %-100 %] 100 % (07/07 0600) Arterial Line BP: (75-93)/(66-82) 93/77 (07/07 0600) FiO2 (%):  [40 %] 40 % (07/06 1012) Weight:  [207 lb 10.8 oz (94.2 kg)] 207 lb 10.8 oz (94.2 kg) (07/07 0430)  Hemodynamic parameters for last 24 hours: CVP:  [6 mmHg-10 mmHg] 6 mmHg  Intake/Output from previous day: 07/06 0701 - 07/07 0700 In: 2501.4 [I.V.:1861.4; NG/GT:90; IV Piggyback:550] Out: 565 [Urine:365; Emesis/NG output:200] Intake/Output this shift: No intake/output data recorded.       Exam    General- alert and comfortable   Lungs- clear without rales, wheezes   Cor- regular rate and rhythm, no murmur , gallop   Abdomen- soft,  non-tender   Extremities - warm, non-tender, minimal edema   Neuro- oriented, appropriate, no focal weakness   Lab Results:  Recent Labs  04/25/17 0452 04/26/17 0357  WBC 20.4* 22.4*  HGB 11.0* 8.8*  HCT 33.4* 27.2*  PLT 380 355   BMET:  Recent Labs  04/25/17 2000 04/26/17 0357  NA 132* 132*  K 3.7 4.0  CL 101 101  CO2 22 21*  GLUCOSE 171* 170*  BUN 69* 71*  CREATININE 3.26* 3.26*  CALCIUM 7.6* 7.9*    PT/INR:  Recent Labs  04/26/17 0357  LABPROT 24.6*  INR 2.17   ABG    Component Value Date/Time   PHART 7.296 (L) 04/26/2017 0350   HCO3 20.9 04/26/2017 0350   TCO2 23 04/24/2017 1919   ACIDBASEDEF 4.5 (H) 04/26/2017 0350   O2SAT 76.4 04/26/2017 0358   CBG (last 3)   Recent Labs  04/25/17 2000 04/26/17 0016 04/26/17 0347  GLUCAP 174* 175* 180*    Assessment/Plan: S/P Procedure(s) (LRB): MEDIASTINAL EXPLORATION (N/A) Mobilize out of bed Pulmonary toilet and and he has when necessary Continue T NA, antibiotics and IV albumin 25% Resume Coumadin   LOS: 15 days    Kathlee Nationseter Van Trigt III 04/26/2017

## 2017-04-26 NOTE — Progress Notes (Signed)
PULMONARY / CRITICAL CARE MEDICINE   Name: James [name] MRN: [id number] DOB: April 24, 1961    ADMISSION DATE:  04-28-2017 CONSULTATION DATE:  04/23/2017  REFERRING MD:  Laneta Simmers - CVTS  CHIEF COMPLAINT:  Acute respiratory failure  BRIEF SUMMARY:   [name] with CHF (EF 15%), AICD, CAD, Gout, HTN, CKD, DM, Anemia, admitted April 28, 2017 for VAD placement, as he had already been turned down by [facility] for transplant due to his gout and limitations with his mobility. Now s/p HVAD placement for his ischemic cardiomyopathy. His course has been complicated by AKI-on-CKD, and also complicated by pre-op Afib with RVR. He developed vomiting of feculent material and was found to have an ileus. An NG tube was placed to suction. However, patient then developed acute hypoxic respiratory failure requiring 15L O2 NRB, and he developed worsening respiratory distress, prompting ICU consult.  He was intubated for respiratory arrest, aspiration PNA > developed septic shock / required vasopressors.   Extubated 7/6.     SUBJECTIVE:  Pt reports a sore throat.  Poor cough mechanics.  No acute events overnight.  CoOx >72%.  NGT to LIS approx > 50 ml of drainage in last 24 hours.   VITAL SIGNS: BP (!) 63/59 (BP Location: Left Arm)   Pulse 86   Temp 98.7 F (37.1 C) (Oral)   Resp 20   Ht 6' (1.829 m)   Wt 207 lb 10.8 oz (94.2 kg)   SpO2 100%   BMI 28.17 kg/m   HEMODYNAMICS: CVP:  [6 mmHg-10 mmHg] 6 mmHg  VENTILATOR SETTINGS: Vent Mode: PSV;CPAP FiO2 (%):  [40 %] 40 % Set Rate:  [14 bmp] 14 bmp Vt Set:  [620 mL] 620 mL PEEP:  [5 cmH20] 5 cmH20 Pressure Support:  [5 cmH20-8 cmH20] 5 cmH20 Plateau Pressure:  [19 cmH20] 19 cmH20  INTAKE / OUTPUT: I/O last 3 completed shifts: In: 4193.4 [I.V.:3103.4; NG/GT:90; IV Piggyback:1000] Out: 1265 [Urine:565; Emesis/NG output:700]  PHYSICAL EXAMINATION: General: cachectic adult male, chronically ill appearing HEENT: MM pink/moist, temporal wasting, NGT PSY: calm/appropriate   Neuro: AAOx4, speech clear, MAE/generalized weakness  CV: LVAD hum PULM: even/non-labored, lungs bilaterally with coarse rhonchi  FA:OZHY, non-tender, bsx4 active  Extremities: warm/dry, trace edema  Skin: no rashes or lesions   LABS:  BMET  Recent Labs Lab 04/25/17 0452 04/25/17 2000 04/26/17 0357  NA 131* 132* 132*  K 4.3 3.7 4.0  CL 100* 101 101  CO2 20* 22 21*  BUN 68* 69* 71*  CREATININE 3.00* 3.26* 3.26*  GLUCOSE 211* 171* 170*   Electrolytes  Recent Labs Lab 04/24/17 0238 04/25/17 0452 04/25/17 2000 04/26/17 0357  CALCIUM 7.8* 8.1* 7.6* 7.9*  MG 1.8 1.9  --  2.0  PHOS 4.0 5.1*  --  4.8*   CBC  Recent Labs Lab 04/24/17 0238 04/24/17 1919 04/25/17 0452 04/26/17 0357  WBC 12.4*  --  20.4* 22.4*  HGB 10.1* 10.2* 11.0* 8.8*  HCT 31.9* 30.0* 33.4* 27.2*  PLT 303  --  380 355   Coag's  Recent Labs Lab 04/24/17 0238 04/25/17 0452 04/26/17 0357  APTT 51*  --   --   INR 2.61 2.40 2.17   Sepsis Markers  Recent Labs Lab 04/24/17 0238 04/24/17 0550 04/25/17 0452 04/26/17 0357  LATICACIDVEN 2.5* 1.5  --   --   PROCALCITON 1.96  --  4.60 3.72   ABG  Recent Labs Lab 04/24/17 1853 04/25/17 0358 04/26/17 0350  PHART 7.479* 7.375 7.296*  PCO2ART 31.7* 35.8 44.2  PO2ART 135.0* 115* 140*   Liver Enzymes  Recent Labs Lab 04/24/17 0238 04/25/17 0452 04/26/17 0357  AST 22 23 18   ALT 9* 8* 8*  ALKPHOS 86 95 78  BILITOT 1.1 1.3* 1.1  ALBUMIN 2.3* 2.6* 2.3*    Cardiac Enzymes  Recent Labs Lab 04/24/17 0238  TROPONINI 2.11*    Glucose  Recent Labs Lab 04/25/17 0800 04/25/17 1208 04/25/17 1549 04/25/17 2000 04/26/17 0016 04/26/17 0347  GLUCAP 177* 198* 170* 174* 175* 180*    Imaging Dg Chest Port 1 View  Result Date: 04/26/2017 CLINICAL DATA:  LVAD EXAM: PORTABLE CHEST 1 VIEW COMPARISON:  Chest x-ray from yesterday FINDINGS: Interval tracheal extubation. Low lung volumes persist with atelectasis at the bases. Right  upper extremity PICC with tip at the upper right atrium. Single chamber ICD lead into the right ventricle. LVAD present. Stable cardiomegaly. Status post CABG. An orogastric tube reaches the stomach at least. No pneumothorax or convincing effusion. IMPRESSION: 1. Stable low lung volumes after extubation. 2. Stable atelectasis and vascular congestion. Electronically Signed   By: Marnee SpringJonathon  Watts M.D.   On: 04/26/2017 07:10   Dg Abd Portable 1v  Result Date: 04/26/2017 CLINICAL DATA:  LVAD.  Ileus. EXAM: PORTABLE ABDOMEN - 1 VIEW COMPARISON:  04/23/2017 FINDINGS: Prominence of colonic gas but less distention compared to prior. A nasogastric tube tip overlaps the stomach. Haziness in the lower abdomen, ascites not excluded. No concerning mass effect or gas collection. IMPRESSION: 1. Partially normalized bowel gas pattern compared to 04/23/2017. There is still prominent colonic gas, likely from residual ileus. 2. Nasogastric tube is in good position. Electronically Signed   By: Marnee SpringJonathon  Watts M.D.   On: 04/26/2017 07:12     STUDIES:  None  CULTURES: Blood 7/5 >> Sputum 7/5 >> Sputum 7/7 >>   ANTIBIOTICS: Vancomycin 7/5 >> Zosyn 7/5 >>   SIGNIFICANT EVENTS: 7/02  PRBC 7/05  Respiratory arrest post feculent vomitus episode 7/06  Extubated   LINES/TUBES: ETT 7/5 >> 7/6 Double lumen PICC on the right 7/2 >>  ASSESSMENT / PLAN: [name] with CHF (EF 15%), AICD, CAD, Gout, HTN, CKD, DM, Anemia, admitted 09/30/17 for VAD placement, now POD 7 s/p HVAD placement, with course complicated by AKI-on-CKD and pre-op Afib with RVR. Overnight 7/5 he developed feculent vomiting related to an ileus with subsequent acute hypoxic respiratory failure with severe respiratory distress due to aspiration pneumonia, requiring intubation, & associated development of septic shock.  Extubated 7/6.  PULMONARY A: Acute hypoxic respiratory failure in setting of aspiration  Aspiration PNA P: Pulmonary hygiene -  mobilize, NTS PRN Monitor CXR  Aspiration precautions   CARDIOVASCULAR A: Acute on Chronic Systolic CHF / ICM - [employer] ICD, turned down for transplant Atrial Fibrillation  Shock - likely multifactorial, resolving Mediastinal Hematoma s/p Evacuation post LVAD CHF (EF 15%); CAD; History HTN Afib with RVR P: LVAD per CVTS Coumadin per pharmacy  ICU monitoring  Albumin per CVTS  Wean levophed to off (turned off 11am, 7/7)  RENAL A: AKI-on-CKD P: Trend BMP / urinary output Replace electrolytes as indicated Avoid nephrotoxic agents, ensure adequate renal perfusion  GASTROINTESTINAL A: Colonic & Small Bowel Ileus  Vomiting Severe Protein Calorie Malnutrition  P: Continue NGT to LIS  Monitor GI output  NPO TPN per pharmacy  Follow KUB  HEMATOLOGIC A: Anemia P:  Trend CBC  Transfuse per ICU guidelines  INFECTIOUS A: Septic Shock - resolving P: Follow cultures  Trend PCT  IV Vanco /  Zosyn as above Assess vanco level before next dose  ENDOCRINE A: DM Severe Gout P: Monitor glucose  SSI   NEUROLOGIC A: Acute encephalopathy - metabolic in setting of critical illness  P: Minimize sedating medications as able  PT efforts, mobilize  FAMILY  - Updates:  Patient updated on NP rounding 7/7 am on plan of care.   - Inter-disciplinary family meet or Palliative Care meeting due by:  Palliative care following    Canary Brim, NP-C [facility] Pgr: [phone] or if no answer [phone] 04/26/2017, 7:21 AM

## 2017-04-26 NOTE — Progress Notes (Signed)
Patient ID: James [name], male   DOB: 20-Jan-1961, 56 y.o.   MRN: [id number]   Advanced Heart Failure VAD Team Note  Subjective:    Initially admitted and aggressively diuresed.    To OR for HVAD placement 6/28.  Returned to the OR that evening with high chest tube output, evacuation of mediastinal hematoma.   Extubated 6/29.   Milrinone stopped on 7/4.  Evening of 7/4, patient developed nausea and vomiting.  KUB with ileus.  He developed increased dyspnea/tachypnea.  NGT placed with 3 L suctioned out.  CXR with suspicion for aspiration PNA.  Patient had to be intubated.  He went into atrial fibrillation with RVR.  He became hypotensive and was started on norepinephrine and phenylephrine.  Amiodarone gtt begun.     He is now off phenylephrine.  He is a-paced this morning. Remains on norepi at 4. MAP 70s-80s.  Extubated again on 7/6.  Decreased NGT output over the last day.  Still minimal bowel sounds.   Creatinine 3.26 but hopefully has plateaued.  UOP seems to be picking up a bit this morning.  CVP 10, weight up a bit. He has heavy airways secretions requiring suctioning.   Coox 76%. Afebrile.  INR 2.17. PCT 4.6 =>3.72.  WBC up to 22. LDH stable at 219 => 171.  Blood cultures negative so far. Resp Cx with rare GNR.   Hemoglobin lower at 8.8 today, no overt bleeding.    CXR with stable atelectasis and vascular congestion.  AXR with improving bowel gas pattern, still prominent colonic gas suggestive of ileus.   Creatinine 1.6-> 1.8 -> 2.1-> 2.31 -> 2.22 -> 2.09 -> 2.6. -> 3.0 -> 3.26 -> 3.26  Got 1 unit PRBCs on 7/2.   TNA begun 7/5.   HVAD INTERROGATION:  HVAD:  Flow 5 liters/min, speed 2820, power 5.1 W, peak 8/trough 3.5. No alarms.   Objective:    Vital Signs:   Temp:  [97.9 F (36.6 C)-98.7 F (37.1 C)] 98 F (36.7 C) (07/07 0800) Pulse Rate:  [68-87] 86 (07/07 0600) Resp:  [14-27] 20 (07/07 0600) SpO2:  [97 %-100 %] 100 % (07/07 0600) Arterial Line BP: (75-93)/(66-82)  93/77 (07/07 0600) FiO2 (%):  [40 %] 40 % (07/06 1012) Weight:  [207 lb 10.8 oz (94.2 kg)] 207 lb 10.8 oz (94.2 kg) (07/07 0430) Last BM Date: 04/23/17 Mean arterial Pressure 70s-80s  Intake/Output:   Intake/Output Summary (Last 24 hours) at 04/26/17 0851 Last data filed at 04/26/17 0600  Gross per 24 hour  Intake          2371.86 ml  Output              540 ml  Net          1831.86 ml     Physical Exam    General: NAD, has NGT, chronically ill-appearing HEENT: Normal. Anicteric Neck: supple. JVP 10 cm.  Cor: Sternal dressing C/D/I. + LVAD hum, normal pump sounds.   Lungs: Dependent crackles Abdomen: Soft, NT, ND, + BS.  Extremities: Warm, trace ankle edema. RLE ulcer with dressing. + Diffuse severe gouty/tophaceous changes. 1+ ankle edema.  Neuro: Alert/oriented.   Telemetry   Personally reviewed, A paced V sensed in 80s     Labs   Basic Metabolic Panel:  Recent Labs Lab 04/22/17 0339 04/23/17 0414 04/23/17 2225 04/24/17 0238 04/24/17 1919 04/25/17 0452 04/25/17 2000 04/26/17 0357  NA 132* 133* 132* 133* 134* 131* 132* 132*  K 3.8 3.9  4.7 4.6 4.3 4.3 3.7 4.0  CL 100* 100* 99* 101 99* 100* 101 101  CO2 _0 --  20* 22 21*  GLUCOSE 120* 104* 68 98 119* 211* 171* 170*  BUN 48* 49* 59* 59* 56* 68* 69* 71*  CREATININE 2.22* 2.09* 2.68* 2.60* 2.60* 3.00* 3.26* 3.26*  CALCIUM 8.1* 8.2* 8.2* 7.8*  --  8.1* 7.6* 7.9*  MG 2.0 2.0  --  1.8  --  1.9  --  2.0  PHOS 3.6 3.4  --  4.0  --  5.1*  --  4.8*    Liver Function Tests:  Recent Labs Lab 04/23/17 0414 04/23/17 2225 04/24/17 0238 04/25/17 0452 04/26/17 0357  AST _1 ALT 8* 11* 9* 8* 8*  ALKPHOS 95 88 86 95 78  BILITOT 1.2 1.2 1.1 1.3* 1.1  PROT 5.5* 5.9* 5.1* 5.7* 5.2*  ALBUMIN 2.4* 2.5* 2.3* 2.6* 2.3*    Recent Labs Lab 04/23/17 2225  LIPASE 93*  AMYLASE 91   No results for input(s): AMMONIA in the last 168 hours.  CBC:  Recent Labs Lab 04/21/17 0334 04/22/17 0339  04/23/17 0414 04/24/17 0238 04/24/17 1919 04/25/17 0452 04/26/17 0357  WBC 17.7* 14.8* 11.2* 12.4*  --  20.4* 22.4*  NEUTROABS 15.4* 12.4* 8.7* 11.6*  --  18.6*  --   HGB 10.0* 9.6* 9.9* 10.1* 10.2* 11.0* 8.8*  HCT 30.4* 30.1* 31.0* 31.9* 30.0* 33.4* 27.2*  MCV 85.4 86.2 90.4 89.6  --  91.0 90.7  PLT 204 247 303 303  --  380 355    INR:  Recent Labs Lab 04/22/17 0339 04/23/17 0414 04/24/17 0238 04/25/17 0452 04/26/17 0357  INR 1.33 1.68 2.61 2.40 2.17    Other results:     Imaging   Dg Chest Port 1 View  Result Date: 04/26/2017 CLINICAL DATA:  LVAD EXAM: PORTABLE CHEST 1 VIEW COMPARISON:  Chest x-ray from yesterday FINDINGS: Interval tracheal extubation. Low lung volumes persist with atelectasis at the bases. Right upper extremity PICC with tip at the upper right atrium. Single chamber ICD lead into the right ventricle. LVAD present. Stable cardiomegaly. Status post CABG. An orogastric tube reaches the stomach at least. No pneumothorax or convincing effusion. IMPRESSION: 1. Stable low lung volumes after extubation. 2. Stable atelectasis and vascular congestion. Electronically Signed   By: Monte Fantasia M.D.   On: 04/26/2017 07:10   Dg Chest Port 1 View  Result Date: 04/25/2017 CLINICAL DATA:  56 year old male status post intubation EXAM: PORTABLE CHEST 1 VIEW COMPARISON:  Prior chest x-ray 04/24/2017 FINDINGS: The patient is intubated. The endotracheal tube is less than 1 cm above the carina. A gastric tube is present. The tip lies off the field of view, below the diaphragm and presumably within the stomach. Right upper extremity PICC. Catheter tip in good position at the superior cavoatrial junction. Left ventricular assist device is incompletely imaged. Patient is status post median sternotomy with evidence of prior multivessel CABG. Left subclavian approach intracardiac defibrillator with lead in unchanged position overlying the right ventricle. Stable cardiomegaly with left  heart enlargement. Improved interstitial edema compared to the prior chest x-ray. Persistent low inspiratory volumes with bibasilar atelectasis. Surgical changes of prior right rotator cuff repair. No acute osseous abnormality. 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 IMPRESSION: 1. The endotracheal tube is within 1 cm of the carina. Consider withdrawing 2-3 cm for more optimal position. Otherwise, stable and  satisfactory support apparatus. 2. Improved pulmonary vascular congestion and pulmonary edema compared to yesterday. 3. Low inspiratory volumes with bibasilar atelectasis. Electronically Signed   By: Jacqulynn Cadet M.D.   On: 04/25/2017 07:44   Dg Abd Portable 1v  Result Date: 04/26/2017 CLINICAL DATA:  LVAD.  Ileus. EXAM: PORTABLE ABDOMEN - 1 VIEW COMPARISON:  04/23/2017 FINDINGS: Prominence of colonic gas but less distention compared to prior. A nasogastric tube tip overlaps the stomach. Haziness in the lower abdomen, ascites not excluded. No concerning mass effect or gas collection. IMPRESSION: 1. Partially normalized bowel gas pattern compared to 04/23/2017. There is still prominent colonic gas, likely from residual ileus. 2. Nasogastric tube is in good position. Electronically Signed   By: Monte Fantasia M.D.   On: 04/26/2017 07:12     Medications:     Scheduled Medications: . aspirin EC  325 mg Oral Daily   Or  . aspirin  324 mg Per Tube Daily   Or  . aspirin  300 mg Rectal Daily  . bisacodyl  10 mg Oral Daily   Or  . bisacodyl  10 mg Rectal Daily  . chlorhexidine gluconate (MEDLINE KIT)  15 mL Mouth Rinse BID  . Chlorhexidine Gluconate Cloth  6 each Topical Daily  . fentaNYL (SUBLIMAZE) injection  50 mcg Intravenous Once  . furosemide  60 mg Intravenous Once  . insulin aspart  0-24 Units Subcutaneous Q4H  . mouth rinse  15 mL Mouth Rinse QID  . sodium chloride flush  10-40 mL Intracatheter Q12H  . sodium chloride flush   10-40 mL Intracatheter Q12H  . warfarin  2.5 mg Oral ONCE-1800  . Warfarin - Physician Dosing Inpatient   Does not apply q1800    Infusions: . sodium chloride Stopped (04/19/17 1200)  . sodium chloride Stopped (04/21/17 1500)  . albumin human    . amiodarone 30 mg/hr (04/25/17 2000)  . lactated ringers    . lactated ringers 20 mL/hr at 04/25/17 2000  . norepinephrine (LEVOPHED) Adult infusion 4 mcg/min (04/25/17 2224)  . piperacillin-tazobactam (ZOSYN)  IV Stopped (04/26/17 0722)  . TPN (CLINIMIX) Adult without lytes 40 mL/hr at 04/25/17 2000  . vancomycin Stopped (04/26/17 0452)    PRN Medications: albuterol, docusate, fentaNYL, hydrALAZINE, midazolam, midazolam, neomycin-bacitracin-polymyxin, ondansetron (ZOFRAN) IV, oxyCODONE, phenol, sodium chloride flush, sodium chloride flush, traMADol   Patient Profile   56 yo with CAD s/p CABG, ischemic cardiomyopathy/chronic systolic CHF, tophaceous gout, and CKD stage 3 was admitted for diuresis and consideration for LVAD placement. S/p HVAD on 6/28  Assessment/Plan:    1. Acute/chronic systolic CHF: Ischemic cardiomyopathy.  St Jude ICD.  Echo (6/18) with EF 15%, mildly dilated RV with moderately decreased systolic function.  RHC 6/22 with elevated filling pressures and evidence of RV failure though PAPi score 1.95 so not prohibitive.  He has been seen at [facility] and turned down for transplant with severe tophaceous gout and concern for worsening on transplant meds.  He was diuresed extensively, repeat RHC 6/26 with marked improvement in filling pressures and stable cardiac output on milrinone.  HVAD placement 6/28 and had to return to OR to evacuate mediastinal hematoma.  He had been weaned off pressors and milrinone, but developed ileus and likely aspiration event 7/4, re-intubated, developed afib/RVR, and now back on norepinephrine (phenylephrine stopped).  On 7/6, he was extubated.  Norepinephrine down to 4 with co-ox 76%.  Good MAP today and  stable LVAD parameters. CVP 10.  - Lung with copious secretions  and mildly elevated CVP.  Will give Lasix 60 mg IV x 1 today.   - Hopefully can wean down/off norepinephrine today.   - On ASA. INR 2.17, will get dose warfarin.  - Holding digoxin with rise in creatinine.   2. AKI on CKD stage 3: Suspect a component of peri-op ATN, worsened with ileus, vomiting, intubation/sedation.  Now hopefully has plateaued at 3.26.   - Will get 1 dose of Lasix today with CVP 10 and rising weight.  3. Anemia: Chronic, seen by GI pre-op, they did not feel that he needed endoscopy.  FOBT negative initially. He had 1 unit PRBCs 7/1 with appropriate bump. Hgb down today at 8.8, overt bleeding.  Transfuse hgb < 8.  4. CAD: s/p CABG 2012.   - Stable.   5. Gout: Severe tophaceous gout. No complaint of gouty pain today.  - Had been on Uloric and daily colchicine. Limited in ability to give with npo status.    6. Atrial fibrillation: Developed atrial fibrillation with RVR in setting of aspiration PNA on 7/4.  Amiodarone gtt begun, now out of atrial fibrillation (a-paced).    - Transition to po amiodarone when he is taking po.  7. Malnutrition: Major issue with baseline poor nutritional status and now with ileus.  Bowel gas pattern with some improvement on AXR today.   - Nutrition following, now getting TNA.   - Hopefully back to po nutrition soon.  8. Suspect aspiration PNA with acute hypoxemic respiratory failure on 7/4: Now extubated.  WBCs are high as is PCT.  PCT trending down.  Afebrile.  Cultures so far negative. He was extubated on 7/6.  - Continue vancomycin/Zosyn. 9. Ileus: Vomiting on 7/4 with aspiration PNA.  Watching closely for signs of ischemic bowel => he denies abdominal pain and abdomen is soft.  Decreased NGT output and AXR improving though still colonic gas.  10. Deconditioning: Out of bed to chair.  Suspect he will eventually need CIR.   I reviewed the HVAD parameters from today, and compared the  results to the patient's prior recorded data.  No programming changes were made.  The HVAD is functioning within specified parameters.     Loralie Champagne, MD 04/26/2017, 8:51 AM  VAD Team --- VAD ISSUES ONLY--- Pager [phone] (7am - 7am)  Advanced Heart Failure Team  Pager [phone] (M-F; 7a - 4p)  Please contact [facility] for night-coverage after hours (4p -7a ) and weekends on [email or web address]

## 2017-04-26 NOTE — Progress Notes (Addendum)
PHARMACY - ADULT TOTAL PARENTERAL NUTRITION CONSULT NOTE   Pharmacy Consult for TPN Indication: Ileus and poor baseline nutritional status  Patient Measurements: Height: 6' (182.9 cm) Weight: 207 lb 10.8 oz (94.2 kg) IBW/kg (Calculated) : 77.6 TPN AdjBW (KG): 96.9 Body mass index is 28.17 kg/m.  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 Patient started on enteral soft diet 7/1 but decompensated overnight 7/4 requiring re-intubation and found to have ileus after vomiting fecal material. NGT was placed and 3000 ml suctioned. Pharmacy consulted to start TPN.   GI: New ileus with severe malnutrition at baseline. Abdomen distended. NGT output down 200 ml yesterday. Pre-albumin <5 on 7/5. Albumin 2.3.  Reglan 10 IV q6h x3 doses. Pepcid 20 IV daily. LBM was 7/4. Endo: DM on glipizide pta. CBG elevated at 170-198.  Insulin requirements in the past 24 hours: 24 units SSI (Detemir discontinued -last dose 7/4).  Lytes: Na low 132 (has been low for past 5 days prior to TPN, 136 on admit). K 4 (goal >4), Phos down to 4.8. Mg 2 (goal >2 with ileus). Caution with replacement in worsening AKI. CoCa 9.3. iCa 1.08 on 7/5. Renal: AKI on CKD. SCr continues to rise at 3.26. BUN up 68. UOP low 0.2 cc/kg/hr. +2L last 24hrs (-3L for admission). Giving Lasix 60 mg IV x1 today. Pulm: Extubated 7/6 to 4L [place] >> now on 2L.  Cards: CHF (EF 15%) w/ LVAD, CAD, HTN, Afib- Hypotensive requiring pressors (Levophed at 4, Phenylephrine off, VP). Milrinone is currently off. Amio drip at 30 mg/hr. HR 80s. CVP 6. Coox 70%. Hepatobil: LFTs wnl, Tbili decreased to 1.1, wnl. TG 78 Neuro: Fentanyl drip off, using prn Morphine. RASS -1 to 0. GCS 15.  Heme/AC: warfarin for atrial fibrillation initiated 7/4. Held due to NPO status. Hgb down at 8.8, Plts 355. INR down to 2.17. Plan to start heparin when INR falls <2 with NPO  status.  ID: Initiated on empiric vancomycin and Zosyn day #3. WBC up 22.4, afebrile. LA down from 2.5 to 1.5. PCT down 3.72. MRSA pcr positive. Resp culture with rare GNR.   Best Practices: Warfarin (change to IV heparin/Lovenox while NPO?) TPN Access: PICC 7/2 TPN start date: 7/5>>  Nutritional Goals (per RD recommendation on 7/5): KCal: 2200 Protein: 120-140 gm  Goal with lipids: Clinimix E 5/15 at 105 ml/hr with IV lipid emulsions (126g of protein and 2269 kcal per day)  Current Nutrition:  NPO Clinimix 5/15 at 40 ml/hr   Plan:  Increase Clinimix 5/15 to 2760ml/hr - no electrolytes due to elevated Phos. Hold 20% lipid emulsion for first 7 days for ICU patients per ASPEN guidelines (Start date 7/12) [facility]. This provides 72 g of protein and 1022 kCals per day meeting 60% of protein and 50% of kCal needs Add MVI in TPN daily Add trace elements in TPN every other day, next 7/7 Add 10 units of Insulin to TPN bag.  Continue TCTS q4h SSI  Add Pepcid 20 mg IV in TPN bag. Monitor TPN labs Watch Tbili  Will recheck BMET & Mg this PM due to need to remove lytes from TPN and replace if needed.  Link SnufferJessica Quinntin Malter, PharmD, BCPS Clinical Pharmacist Clinical Phone 04/26/2017 until 3:30 PM - [phone] After hours, please call [phone] 04/26/2017,7:20 AM

## 2017-04-26 NOTE — Progress Notes (Signed)
Flutter valve given to patient and explained by RT. Family at bedside, RT answered questions and had patient perform about 10 times with no cough after.

## 2017-04-26 NOTE — Progress Notes (Signed)
Physical Therapy Treatment Patient Details Name: James [name] MRN: [id number] DOB: Apr 13, 1961 Today's Date: 04/26/2017    History of Present Illness 56 y.o. male  admitted on 04/04/2017 with significant PMH for gout, DMII, HTN, CKD, CAD S/P CABGx6 2012, chronic systolic heart failure and St jude ICD. presents now s/p heartware HVAD implant    PT Comments    Patient was last seen by PT on 04/22/17 - able to ambulate 370' with Eva walker and +2 assist.  On 04/23/17, patient with N/V, dyspnea, Afib in 160's.  Patient was re-intubated on that day.  Patient now extubated on 04/25/17 with no further vomiting from ileus.  Today patient was more weak with decreased activity tolerance.  Was able to stand and step in place, and perform some LE exercises.  At this point, would recommend Inpatient Rehab consult with goal to return patient to highest functional level to allow safe d/c home.  Will continue to follow.   Follow Up Recommendations  CIR;Supervision for mobility/OOB     Equipment Recommendations  Other (comment) (TBD)    Recommendations for Other Services Rehab consult     Precautions / Restrictions Precautions Precautions: Sternal Precaution Comments: Heartware HVAD, external pacer wires, A-line Restrictions Weight Bearing Restrictions: Yes Other Position/Activity Restrictions: Sternal precautions    Mobility  Bed Mobility               General bed mobility comments: Patient in chair as PT entered room  Transfers Overall transfer level: Needs assistance Equipment used: Rolling walker (2 wheeled) Transfers: Sit to/from Stand Sit to Stand: Mod assist;+2 physical assistance         General transfer comment: Reviewed sternal precautions.  Required mod assist to scoot to edge of chair and +2 mod assist to power up to standing with use of pillow on chest.  Assist to shift weight forward over LE's.  Assist to control descent into chair.  Patient stood x2.  Able to march in place 20  steps with each foot on each attempt.  Ambulation/Gait             General Gait Details: NT - first day OOB after extubation on 04/25/17.   Stairs            Wheelchair Mobility    Modified Rankin (Stroke Patients Only)       Balance           Standing balance support: Bilateral upper extremity supported (Lightly on RW for balance) Standing balance-Leahy Scale: Poor Standing balance comment: reliant on bil. UE support                             Cognition Arousal/Alertness: Awake/alert Behavior During Therapy: Flat affect Overall Cognitive Status: Within Functional Limits for tasks assessed                                        Exercises General Exercises - Lower Extremity Long Arc Quad: AROM;Both;10 reps;Seated Hip Flexion/Marching: AROM;Both;10 reps;Seated Toe Raises: AROM;Both;15 reps;Seated Heel Raises: AROM;Both;15 reps;Seated    General Comments        Pertinent Vitals/Pain Pain Assessment: Faces Faces Pain Scale: Hurts little more Pain Location: Sore throat Pain Descriptors / Indicators: Grimacing (Low/soft speech) Pain Intervention(s): Monitored during session    Home Living  Prior Function            PT Goals (current goals can now be found in the care plan section) Acute Rehab PT Goals Patient Stated Goal: to go home PT Goal Formulation: With patient/family Time For Goal Achievement: 05/10/17 Potential to Achieve Goals: Good Progress towards PT goals: Not progressing toward goals - comment;Goals downgraded-see care plan (Patient was re-intubated on 04/23/17, and extub on 04/25/17. )    Frequency    Min 3X/week      PT Plan Discharge plan needs to be updated    Co-evaluation              AM-PAC PT "6 Clicks" Daily Activity  Outcome Measure  Difficulty turning over in bed (including adjusting bedclothes, sheets and blankets)?: Total Difficulty moving from lying  on back to sitting on the side of the bed? : Total Difficulty sitting down on and standing up from a chair with arms (e.g., wheelchair, bedside commode, etc,.)?: Total Help needed moving to and from a bed to chair (including a wheelchair)?: A Lot Help needed walking in hospital room?: A Lot Help needed climbing 3-5 steps with a railing? : A Lot 6 Click Score: 9    End of Session   Activity Tolerance: Patient tolerated treatment well;Patient limited by fatigue Patient left: in chair;with call bell/phone within reach;with family/visitor present Nurse Communication: Mobility status PT Visit Diagnosis: Unsteadiness on feet (R26.81);Muscle weakness (generalized) (M62.81);Difficulty in walking, not elsewhere classified (R26.2);Pain Pain - part of body:  (Throat)     Time: 1610-9604 PT Time Calculation (min) (ACUTE ONLY): 24 min  Charges:  $Therapeutic Activity: 8-22 mins                    G Codes:       Durenda Hurt. Renaldo Fiddler, [facility] Acute Rehab Services Pager [phone]    Vena Austria 04/26/2017, 8:22 PM

## 2017-04-26 NOTE — Progress Notes (Signed)
PHARMACY - ADULT TOTAL PARENTERAL NUTRITION CONSULT NOTE   Pharmacy Consult for TPN Indication: Ileus and poor baseline nutritional status  Patient Measurements: Height: 6' (182.9 cm) Weight: 207 lb 10.8 oz (94.2 kg) IBW/kg (Calculated) : 77.6 TPN AdjBW (KG): 96.9 Body mass index is 28.17 kg/m.  1800 BMET K down likely due to Lasix K 3.5, Mg 2.1 Noted Na 129 - monitor and reassess tomorrow Give KCl 10 mEq x 4 runs  Agapito GamesAlison Cherisse Carrell, PharmD, BCPS Clinical Pharmacist 04/26/2017 7:14 PM

## 2017-04-27 ENCOUNTER — Inpatient Hospital Stay (HOSPITAL_COMMUNITY): Payer: Medicare PPO

## 2017-04-27 LAB — CBC
HCT: 26.6 % — ABNORMAL LOW (ref 39.0–52.0)
Hemoglobin: 8.6 g/dL — ABNORMAL LOW (ref 13.0–17.0)
MCH: 29.9 pg (ref 26.0–34.0)
MCHC: 32.3 g/dL (ref 30.0–36.0)
MCV: 92.4 fL (ref 78.0–100.0)
PLATELETS: 327 10*3/uL (ref 150–400)
RBC: 2.88 MIL/uL — AB (ref 4.22–5.81)
RDW: 19.3 % — AB (ref 11.5–15.5)
WBC: 17.6 10*3/uL — AB (ref 4.0–10.5)

## 2017-04-27 LAB — BASIC METABOLIC PANEL
Anion gap: 10 (ref 5–15)
BUN: 74 mg/dL — AB (ref 6–20)
CO2: 21 mmol/L — ABNORMAL LOW (ref 22–32)
CREATININE: 3.2 mg/dL — AB (ref 0.61–1.24)
Calcium: 8.1 mg/dL — ABNORMAL LOW (ref 8.9–10.3)
Chloride: 99 mmol/L — ABNORMAL LOW (ref 101–111)
GFR, EST AFRICAN AMERICAN: 24 mL/min — AB (ref >60)
GFR, EST NON AFRICAN AMERICAN: 20 mL/min — AB (ref >60)
Glucose, Bld: 162 mg/dL — ABNORMAL HIGH (ref 65–99)
POTASSIUM: 4 mmol/L (ref 3.5–5.1)
SODIUM: 130 mmol/L — AB (ref 135–145)

## 2017-04-27 LAB — GLUCOSE, CAPILLARY
GLUCOSE-CAPILLARY: 154 mg/dL — AB (ref 65–99)
GLUCOSE-CAPILLARY: 178 mg/dL — AB (ref 65–99)
Glucose-Capillary: 207 mg/dL — ABNORMAL HIGH (ref 65–99)

## 2017-04-27 LAB — BLOOD GAS, ARTERIAL
Acid-base deficit: 6.5 mmol/L — ABNORMAL HIGH (ref 0.0–2.0)
Bicarbonate: 17.8 mmol/L — ABNORMAL LOW (ref 20.0–28.0)
FIO2: 21
O2 Saturation: 99.1 %
Patient temperature: 98.6
pCO2 arterial: 31.3 mmHg — ABNORMAL LOW (ref 32.0–48.0)
pH, Arterial: 7.372 (ref 7.350–7.450)
pO2, Arterial: 130 mmHg — ABNORMAL HIGH (ref 83.0–108.0)

## 2017-04-27 LAB — COOXEMETRY PANEL
CARBOXYHEMOGLOBIN: 1.6 % — AB (ref 0.5–1.5)
Carboxyhemoglobin: 1.8 % — ABNORMAL HIGH (ref 0.5–1.5)
Carboxyhemoglobin: 2 % — ABNORMAL HIGH (ref 0.5–1.5)
Methemoglobin: 1 % (ref 0.0–1.5)
Methemoglobin: 1 % (ref 0.0–1.5)
Methemoglobin: 1.1 % (ref 0.0–1.5)
O2 SAT: 43.5 %
O2 Saturation: 49.5 %
O2 Saturation: 65.1 %
Total hemoglobin: 11.9 g/dL — ABNORMAL LOW (ref 12.0–16.0)
Total hemoglobin: 8.3 g/dL — ABNORMAL LOW (ref 12.0–16.0)
Total hemoglobin: 8.4 g/dL — ABNORMAL LOW (ref 12.0–16.0)

## 2017-04-27 LAB — CULTURE, RESPIRATORY W GRAM STAIN

## 2017-04-27 LAB — MAGNESIUM: MAGNESIUM: 1.9 mg/dL (ref 1.7–2.4)

## 2017-04-27 LAB — LACTATE DEHYDROGENASE: LDH: 188 U/L (ref 98–192)

## 2017-04-27 LAB — PHOSPHORUS: PHOSPHORUS: 3.9 mg/dL (ref 2.5–4.6)

## 2017-04-27 LAB — CULTURE, RESPIRATORY

## 2017-04-27 LAB — HEPARIN LEVEL (UNFRACTIONATED)
Heparin Unfractionated: <0.1 IU/mL — ABNORMAL LOW (ref 0.30–0.70)
Heparin Unfractionated: <0.1 IU/mL — ABNORMAL LOW (ref 0.30–0.70)

## 2017-04-27 LAB — PROTIME-INR
INR: 1.8
PROTHROMBIN TIME: 21.1 s — AB (ref 11.4–15.2)

## 2017-04-27 LAB — VANCOMYCIN, RANDOM: VANCOMYCIN RM: 39

## 2017-04-27 MED ORDER — MAGNESIUM SULFATE 2 GM/50ML IV SOLN
2.0000 g | Freq: Once | INTRAVENOUS | Status: AC
  Administered 2017-04-27: 2 g via INTRAVENOUS
  Filled 2017-04-27: qty 50

## 2017-04-27 MED ORDER — HEPARIN (PORCINE) IN NACL 100-0.45 UNIT/ML-% IJ SOLN
2300.0000 [IU]/h | INTRAMUSCULAR | Status: DC
  Administered 2017-04-27: 1000 [IU]/h via INTRAVENOUS
  Administered 2017-04-28: 1600 [IU]/h via INTRAVENOUS
  Administered 2017-04-28: 2100 [IU]/h via INTRAVENOUS
  Filled 2017-04-27 (×4): qty 250

## 2017-04-27 MED ORDER — ORAL CARE MOUTH RINSE
15.0000 mL | Freq: Two times a day (BID) | OROMUCOSAL | Status: DC
  Administered 2017-04-27 – 2017-04-30 (×6): 15 mL via OROMUCOSAL

## 2017-04-27 MED ORDER — MILRINONE LACTATE IN DEXTROSE 20-5 MG/100ML-% IV SOLN
0.2500 ug/kg/min | INTRAVENOUS | Status: DC
  Administered 2017-04-27 – 2017-04-29 (×4): 0.25 ug/kg/min via INTRAVENOUS
  Filled 2017-04-27 (×3): qty 100

## 2017-04-27 MED ORDER — FUROSEMIDE 10 MG/ML IJ SOLN
80.0000 mg | Freq: Once | INTRAMUSCULAR | Status: AC
  Administered 2017-04-27: 80 mg via INTRAVENOUS
  Filled 2017-04-27: qty 8

## 2017-04-27 MED ORDER — M.V.I. ADULT IV INJ
INTRAVENOUS | Status: AC
  Administered 2017-04-27: 18:00:00 via INTRAVENOUS
  Filled 2017-04-27: qty 1920

## 2017-04-27 MED ORDER — MILRINONE LACTATE IN DEXTROSE 20-5 MG/100ML-% IV SOLN: 0.3000 ug/kg/min | INTRAVENOUS | Status: DC

## 2017-04-27 MED ORDER — POTASSIUM CHLORIDE 10 MEQ/50ML IV SOLN
10.0000 meq | INTRAVENOUS | Status: AC
  Administered 2017-04-27 (×2): 10 meq via INTRAVENOUS
  Filled 2017-04-27 (×3): qty 50

## 2017-04-27 MED ORDER — FEBUXOSTAT 40 MG PO TABS
40.0000 mg | ORAL_TABLET | Freq: Every day | ORAL | Status: DC
  Administered 2017-04-27 – 2017-05-11 (×12): 40 mg via ORAL
  Filled 2017-04-27 (×16): qty 1

## 2017-04-27 MED ORDER — POTASSIUM CHLORIDE 10 MEQ/50ML IV SOLN: 10.0000 meq | Freq: Once | INTRAVENOUS | Status: DC

## 2017-04-27 MED ORDER — WARFARIN SODIUM 5 MG PO TABS
5.0000 mg | ORAL_TABLET | Freq: Once | ORAL | Status: AC
  Administered 2017-04-27: 5 mg via ORAL
  Filled 2017-04-27: qty 1

## 2017-04-27 MED ORDER — MAGNESIUM SULFATE IN D5W 1-5 GM/100ML-% IV SOLN
1.0000 g | Freq: Once | INTRAVENOUS | Status: DC
  Filled 2017-04-27: qty 100

## 2017-04-27 NOTE — Plan of Care (Signed)
Problem: Education: Goal: Knowledge of the prescribed therapeutic regimen will improve Outcome: Progressing Pt's wife being educated on drive line dressing changes, video taping for future reference. Has yet to demonstrate dressing change with supervision.

## 2017-04-27 NOTE — Progress Notes (Signed)
Rehab Admissions Coordinator Note:  Patient was screened by Clois DupesBoyette, Keishia Ground Godwin for appropriateness for an Inpatient Acute Rehab Consult per PT recommendation.   At this time, we are recommending Inpatient Rehab consult. Please place order for consult if pt would like to be considered for an admission.   Clois DupesBoyette, Lurlie Wigen Godwin 04/27/2017, 12:57 PM  I can be reached at [phone].

## 2017-04-27 NOTE — Progress Notes (Addendum)
Patient ID: James [name], male   DOB: 1961-07-29, 56 y.o.   MRN: [id number]   Advanced Heart Failure VAD Team Note  Subjective:    HVAD placed 6/28.  Returned to the OR that evening with high chest tube output, evacuation of mediastinal hematoma.   Extubated 6/29. Milrinone stopped on 7/4.  Evening of 7/4, patient developed ileus with respiratory compromise. NGT placed with 3 L suctioned out.  CXR with suspicion for aspiration PNA.  Patient had to be intubated.  He went into atrial fibrillation with RVR.  He became hypotensive and was started on norepinephrine and phenylephrine.  Amiodarone gtt begun.     Extubated again on 7/6.   Sitting in chair. Starting to feel better. Had 2 BMs. Co-ox low this am and milrinone restarted. Pending repeat co-ox.   Given lasix 80mg  IV this am by Dr. Norina Buzzard. Good output. CVP now 6. Creatinine down slightly 3.3->3.2  Remains on TNA. CXR with diffuse gaseous distension of SB. Mild edema.    HVAD INTERROGATION:  HVAD:  Flow 4.8 liters/min, speed 2820, power 5.0 W, peak 7.2/trough 3.4. No alarms.   Objective:    Vital Signs:   Temp:  [98 F (36.7 C)-98.4 F (36.9 C)] 98.2 F (36.8 C) (07/08 0400) Pulse Rate:  [39-86] 86 (07/08 0700) Resp:  [19-33] 24 (07/08 1000) BP: (77-96)/(65-78) 96/78 (07/08 0800) SpO2:  [94 %-100 %] 98 % (07/08 0800) Arterial Line BP: (70-88)/(65-78) 76/69 (07/08 1000) Weight:  [97.4 kg (214 lb 11.7 oz)] 97.4 kg (214 lb 11.7 oz) (07/08 0700) Last BM Date: 04/27/17 Mean arterial Pressure  80s  Intake/Output:   Intake/Output Summary (Last 24 hours) at 04/27/17 1106 Last data filed at 04/27/17 1000  Gross per 24 hour  Intake          3007.45 ml  Output              620 ml  Net          2387.45 ml     Physical Exam    General:  Sitting in chair. Weak NAD.  HEENT: normal  Neck: supple. JVP 6 Carotids 2+ bilat; no bruits. No lymphadenopathy or thryomegaly appreciated. Cor: LVAD hum.  Lungs: Clear decreased at  bases Abdomen:soft, nontender, non-distended. No hepatosplenomegaly. No bruits or masses. Bowel sounds picking up  Driveline site clean. Anchor in place.  Extremities: no cyanosis, clubbing, rash. 1-2+ edema  Severe gouty changes Neuro: alert & oriented x 3. No focal deficits. Moves all 4 without problem    Telemetry    A paced V sensed in 80s  Personally reviewed    Labs   Basic Metabolic Panel:  Recent Labs Lab 04/23/17 0414  04/24/17 0238  04/25/17 0452 04/25/17 2000 04/26/17 0357 04/26/17 1739 04/27/17 0421  NA 133*  < > 133*  < > 131* 132* 132* 129* 130*  K 3.9  < > 4.6  < > 4.3 3.7 4.0 3.5 4.0  CL 100*  < > 101  < > 100* 101 101 99* 99*  CO2 24  < > 23  --  20* 22 21* 19* 21*  GLUCOSE 104*  < > 98  < > 211* 171* 170* 194* 162*  BUN 49*  < > 59*  < > 68* 69* 71* 72* 74*  CREATININE 2.09*  < > 2.60*  < > 3.00* 3.26* 3.26* 3.33* 3.20*  CALCIUM 8.2*  < > 7.8*  --  8.1* 7.6* 7.9* 7.9* 8.1*  MG  2.0  --  1.8  --  1.9  --  2.0 2.1 1.9  PHOS 3.4  --  4.0  --  5.1*  --  4.8*  --  3.9  < > = values in this interval not displayed.  Liver Function Tests:  Recent Labs Lab 04/23/17 0414 04/23/17 2225 04/24/17 0238 04/25/17 0452 04/26/17 0357  AST 20 24 22 23 18   ALT 8* 11* 9* 8* 8*  ALKPHOS 95 88 86 95 78  BILITOT 1.2 1.2 1.1 1.3* 1.1  PROT 5.5* 5.9* 5.1* 5.7* 5.2*  ALBUMIN 2.4* 2.5* 2.3* 2.6* 2.3*    Recent Labs Lab 04/23/17 2225  LIPASE 93*  AMYLASE 91   No results for input(s): AMMONIA in the last 168 hours.  CBC:  Recent Labs Lab 04/21/17 0334 04/22/17 0339 04/23/17 0414 04/24/17 0238 04/24/17 1919 04/25/17 0452 04/26/17 0357 04/27/17 0421  WBC 17.7* 14.8* 11.2* 12.4*  --  20.4* 22.4* 17.6*  NEUTROABS 15.4* 12.4* 8.7* 11.6*  --  18.6*  --   --   HGB 10.0* 9.6* 9.9* 10.1* 10.2* 11.0* 8.8* 8.6*  HCT 30.4* 30.1* 31.0* 31.9* 30.0* 33.4* 27.2* 26.6*  MCV 85.4 86.2 90.4 89.6  --  91.0 90.7 92.4  PLT 204 247 303 303  --  380 355 327     INR:  Recent Labs Lab 04/23/17 0414 04/24/17 0238 04/25/17 0452 04/26/17 0357 04/27/17 0421  INR 1.68 2.61 2.40 2.17 1.80    Other results:     Imaging   Dg Chest Port 1 View  Result Date: 04/27/2017 CLINICAL DATA:  Left ventricular assist device. EXAM: PORTABLE CHEST 1 VIEW COMPARISON:  04/26/2017 FINDINGS: 0523 hours. Low lung volumes. The cardio pericardial silhouette is enlarged. Right PICC line tip now projects in the lower right atrium near the junction with the IVC. Left single lead pacer/ AICD again noted. Left ventricular assist device remains in place. There is vascular congestion with probable interstitial edema and basilar atelectasis. Diffuse gaseous bowel distention noted within the visualized upper abdomen. IMPRESSION: 1. Interval removal of NG to with right PICC line now projecting in the inferior right atrium near the junction with the IVC. 2. Cardiomegaly with vascular congestion and interstitial edema. 3. Diffuse gaseous bowel distention within the visualized upper abdomen. Electronically Signed   By: Kennith Center M.D.   On: 04/27/2017 07:14   Dg Chest Port 1 View  Result Date: 04/26/2017 CLINICAL DATA:  LVAD EXAM: PORTABLE CHEST 1 VIEW COMPARISON:  Chest x-ray from yesterday FINDINGS: Interval tracheal extubation. Low lung volumes persist with atelectasis at the bases. Right upper extremity PICC with tip at the upper right atrium. Single chamber ICD lead into the right ventricle. LVAD present. Stable cardiomegaly. Status post CABG. An orogastric tube reaches the stomach at least. No pneumothorax or convincing effusion. IMPRESSION: 1. Stable low lung volumes after extubation. 2. Stable atelectasis and vascular congestion. Electronically Signed   By: Marnee Spring M.D.   On: 04/26/2017 07:10   Dg Abd Portable 1v  Result Date: 04/27/2017 CLINICAL DATA:  LVAD device.  Ileus. EXAM: PORTABLE ABDOMEN - 1 VIEW COMPARISON:  Common radiograph 04/26/2017. FINDINGS: Motion  artifact limits evaluation. Re- demonstrated prominent colonic gas. Slight interval decrease in small bowel distention. Lumbar spine degenerative changes. IMPRESSION: Persistent prominent colonic gas pattern with mild interval decrease in small bowel distention. Electronically Signed   By: Annia Belt M.D.   On: 04/27/2017 07:25   Dg Abd Portable 1v  Result Date: 04/26/2017  CLINICAL DATA:  LVAD.  Ileus. EXAM: PORTABLE ABDOMEN - 1 VIEW COMPARISON:  04/23/2017 FINDINGS: Prominence of colonic gas but less distention compared to prior. A nasogastric tube tip overlaps the stomach. Haziness in the lower abdomen, ascites not excluded. No concerning mass effect or gas collection. IMPRESSION: 1. Partially normalized bowel gas pattern compared to 04/23/2017. There is still prominent colonic gas, likely from residual ileus. 2. Nasogastric tube is in good position. Electronically Signed   By: Marnee SpringJonathon  Watts M.D.   On: 04/26/2017 07:12     Medications:     Scheduled Medications: . aspirin EC  325 mg Oral Daily   Or  . aspirin  324 mg Per Tube Daily   Or  . aspirin  300 mg Rectal Daily  . bisacodyl  10 mg Oral Daily   Or  . bisacodyl  10 mg Rectal Daily  . Chlorhexidine Gluconate Cloth  6 each Topical Daily  . fentaNYL (SUBLIMAZE) injection  50 mcg Intravenous Once  . insulin aspart  0-24 Units Subcutaneous Q4H  . mouth rinse  15 mL Mouth Rinse BID  . sodium chloride flush  10-40 mL Intracatheter Q12H  . sodium chloride flush  10-40 mL Intracatheter Q12H  . sodium chloride flush  3 mL Intravenous Q12H  . warfarin  5 mg Oral ONCE-1800  . Warfarin - Physician Dosing Inpatient   Does not apply q1800    Infusions: . Marland Kitchen.TPN (CLINIMIX-E) Adult    . sodium chloride Stopped (04/19/17 1200)  . sodium chloride Stopped (04/21/17 1500)  . amiodarone 30 mg/hr (04/27/17 0800)  . heparin 1,000 Units/hr (04/27/17 0759)  . lactated ringers    . lactated ringers 20 mL/hr at 04/26/17 1900  . milrinone 0.25  mcg/kg/min (04/27/17 0956)  . norepinephrine (LEVOPHED) Adult infusion Stopped (04/26/17 1100)  . piperacillin-tazobactam (ZOSYN)  IV Stopped (04/27/17 0835)  . TPN (CLINIMIX) Adult without lytes 60 mL/hr at 04/27/17 0700    PRN Medications: albuterol, docusate, fentaNYL, hydrALAZINE, midazolam, midazolam, neomycin-bacitracin-polymyxin, ondansetron (ZOFRAN) IV, oxyCODONE, phenol, sodium chloride flush, sodium chloride flush, sodium chloride flush, traMADol   Patient Profile   56 yo with CAD s/p CABG, ischemic cardiomyopathy/chronic systolic CHF, tophaceous gout, and CKD stage 3 was admitted for diuresis and consideration for LVAD placement. S/p HVAD on 6/28  Assessment/Plan:    1. Acute/chronic systolic CHF: Ischemic cardiomyopathy.  St Jude ICD.  Echo (6/18) with EF 15%, mildly dilated RV with moderately decreased systolic function.  RHC 6/22 with elevated filling pressures and evidence of RV failure though PAPi score 1.95 so not prohibitive.  [facility] and turned [facility] for transplant with severe tophaceous gout and concern for worsening on transplant meds.  He was diuresed extensively, repeat RHC 6/26 with marked improvement in filling pressures and stable cardiac output on milrinone.  HVAD placement 6/28 and had to return to OR to evacuate mediastinal hematoma.  He had been weaned off pressors and milrinone, but developed ileus and likely aspiration event 7/4, re-intubated, developed afib/RVR, and now back on norepinephrine (phenylephrine stopped).  On 7/6, he was extubated.  - Improving but tenuous. Off norepi. Co-ox was low today. Milrinone restarted today at 0.25. Will recheck co-ox  - Got one dose IV lasix this am. He is peripherally volume overloaded. CVP down to 6.  Weight still up about 10 pounds from recent baseline. Give additional lasix as needed - On ASA. INR 1.8. Back on heparin. D/w PharmD - Holding digoxin with rise in creatinine.  2. AKI on CKD stage 3: Suspect a  component of peri-op ATN, worsened with ileus, vomiting, intubation/sedation.   - Improving slowly. Continue supportive care. Keep MAP > 70 3. Anemia: Chronic, seen by GI pre-op, they did not feel that he needed endoscopy.  FOBT negative initially. He had 1 unit PRBCs 7/1 with appropriate bump. Hgb stable at 8.6,  No overt bleeding.  Transfuse hgb < 8.  4. CAD: s/p CABG 2012.   - Stable.   5. Gout: Severe tophaceous gout. No complaint of gouty pain today.  - Had been on Uloric and daily colchicine. Limited in ability to give with npo status. Will restart Uloric. Hold colchicine with AKI    6. Atrial fibrillation: Developed atrial fibrillation with RVR in setting of aspiration PNA on 7/4.  Amiodarone gtt begun, now out of atrial fibrillation (a-paced).    - Transition to po amiodarone when he is taking po.  7. Malnutrition: Major issue with baseline poor nutritional status and now with ileus.  Bowel gas pattern with some improvement on AXR today.   - Nutrition following, now getting TNA.   - Hopefully back to po nutrition soon.  8. Aspiration PNA with acute hypoxemic respiratory failure on 7/4: He was extubated on 7/6.  - WBC and PCT trending down. Afebrile - Tracheal aspirate with Klebsiella and citrobacter. Both sensitive to Zosyn.  - Continue Zosyn. Stop vanc (troguh was high today too) 9. Ileus: Vomiting on 7/4 with aspiration PNA.   - Improving. Now with 2 BMs. Still with dilated SB on CXR. Continue TPN. Advance diet very slowly. 10. Deconditioning: - Out of bed to chair.  Suspect he will eventually need CIR.   I reviewed the HVAD parameters from today, and compared the results to the patient's prior recorded data.  No programming changes were made.  The HVAD is functioning within specified parameters.     Arvilla Meres, MD 04/27/2017, 11:06 AM  VAD Team --- VAD ISSUES ONLY--- Pager [phone] (7am - 7am)  Advanced Heart Failure Team  Pager [phone] (M-F; 7a - 4p)  Please contact  [facility] for night-coverage after hours (4p -7a ) and weekends on [email or web address]

## 2017-04-27 NOTE — Progress Notes (Addendum)
Pharmacy Antibiotic Note  James [name] is a 56 y.o. male admitted on October 08, 2017 for VAD placement on 6/28, now with suspected sepsis.  Pharmacy has been consulted for vancomycin and zosyn dosing.   Patient is afebrile and WBC trending down from 22.4 to 17.6. SCr elevated, with last 3.2 for estimated CrCl ~ 30 mL/min.   Random vancomycin level drawn appropriately before the 4th dose is supratherapeutic at 39 mcg/mL.   Plan: Hold vancomycin  Random vancomycin level 7/9 AM (goal 15-20 mcg/mL) Continue Zosyn 3.375g IV q8hr Monitor renal function, clinical picture, and culture data F/u length of therapy    Height: 6' (182.9 cm) Weight: 207 lb 10.8 oz (94.2 kg) IBW/kg (Calculated) : 77.6  Temp (24hrs), Avg:98.2 F (36.8 C), Min:98 F (36.7 C), Max:98.4 F (36.9 C)   Recent Labs Lab 04/23/17 0414  04/24/17 0238 04/24/17 0550 04/24/17 1919 04/25/17 0452 04/25/17 2000 04/26/17 0357 04/26/17 1739 04/27/17 0421  WBC 11.2*  --  12.4*  --   --  20.4*  --  22.4*  --  17.6*  CREATININE 2.09*  < > 2.60*  --  2.60* 3.00* 3.26* 3.26* 3.33*  --   LATICACIDVEN  --   --  2.5* 1.5  --   --   --   --   --   --   VANCORANDOM  --   --   --   --   --   --   --   --   --  39  < > = values in this interval not displayed.  Estimated Creatinine Clearance: 30.2 mL/min (A) (by C-G formula based on SCr of 3.33 mg/dL (H)).    No Active Allergies  Antimicrobials this admission: 6/28 Cefuroxime > 6/30 6/28 Fluconazole > 6/30  6/28 Rifampin > 6/30 6/28 Vanc > 6/30, 7/5 >>  7/5 Zosyn >>   Dose adjustments this admission: n/a   Microbiology results: 6/22 MRSA PCR - positive  7/5 blood x2>> ngtd 7/5 TA > rare GNR>>reincubate 7/7 TA - pending   York CeriseKatherine Cook, PharmD Clinical Pharmacist 04/27/17 5:58 AM

## 2017-04-27 NOTE — Progress Notes (Signed)
PHARMACY - ADULT TOTAL PARENTERAL NUTRITION CONSULT NOTE   Pharmacy Consult for TPN Indication: Ileus and poor baseline nutritional status  Patient Measurements: Height: 6' (182.9 cm) Weight: 214 lb 11.7 oz (97.4 kg) IBW/kg (Calculated) : 77.6 TPN AdjBW (KG): 96.9 Body mass index is 29.12 kg/m.  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 Patient started on enteral soft diet 7/1 but decompensated overnight 7/4 requiring re-intubation and found to have ileus after vomiting fecal material. NGT was placed and 3000 ml suctioned. Pharmacy consulted to start TPN.   GI: New ileus with severe malnutrition at baseline. Abdomen distended. NGT removed 7/7. Pre-albumin <5 on 7/5. Albumin 2.3. LBM 7/7. Endo: DM on glipizide pta. CBG elevated at 162-194.  Insulin requirements in the past 24 hours: 22 units SSI + 10 units of insulin in TPN (Detemir discontinued -last dose 7/4).  Lytes: Na low 130 (has been low for past 5 days prior to TPN, 136 on admit). K 4 (goal >4), Phos 3.9. Mg 1.9 (goal >2 with ileus). CoCa 9.46. iCa 1.08 on 7/5. No Lytes in TPN: 7/6, 7/7 Renal: AKI on CKD. SCr pk 3.33, now 3.20. BUN up 74. UOP low 0.2 cc/kg/hr. +1.8L last 24hrs (+1.2L for admission). S/p Lasix 60 mg IV x1 on 7/7. Repeating Lasix 80mg  IV x1 today. Pulm: Extubated 7/6, RA.  Cards: CHF (EF 15%) w/ LVAD, CAD, HTN, Afib-Off pressors. Amio drip at 30 mg/hr. HR 80s. CVP 13. Coox down- resuming Milrinone. Hepatobil: LFTs wnl, Tbili decreased to 1.1, wnl. TG 78 Neuro: Pain score 0. RASS 0. GCS 15.  Heme/AC: warfarin for atrial fibrillation initiated 7/4. Held due to NPO status. Resumed 7/7, Heparin bridge. Hgb down at 8.6, Plts 327. INR down to 1.80 ID: Initiated on empiric vancomycin and Zosyn day #4. WBC down 17.6, afebrile. LA down. PCT down. MRSA pcr positive. Resp culture with citrobacter and klebsiella.    Best Practices: Warfarin + Heparin bridge TPN Access: PICC 7/2 TPN start date: 7/5>>  Nutritional Goals (per RD recommendation on 7/5): KCal: 2200 Protein: 120-140 gm  Goal with lipids: Clinimix E 5/15 at 105 ml/hr with IV lipid emulsions (126g of protein and 2269 kcal per day)  Current Nutrition:  Clear liquids Clinimix 5/15 at 60 ml/hr   Plan:  Increase Clinimix E 5/15 to 10680ml/hr. Hold 20% lipid emulsion for first 7 days for ICU patients per ASPEN guidelines (Start date 7/12) This provides 96 g of protein and 1336 kCals per day meeting 80% of protein and 60% of kCal needs Add MVI in TPN daily Add trace elements in TPN every other day, next 7/9 Increase to 20 units of Insulin to TPN bag.  Continue TCTS q4h SSI  Add Pepcid 20 mg IV in TPN bag. Monitor TPN labs Follow-up toleration of clear liquids and ability to advance diet  Give Magnesium 2g IV x1. Give KCl 10 mEq IV x2 runs. Recheck BMET at 1800 and replete if needed with Lasix given today.    Link SnufferJessica Ruther Ephraim, PharmD, BCPS Clinical Pharmacist Clinical Phone 04/27/2017 until 3:30 PM - [phone] After hours, please call #[phone] 04/27/2017,7:54 AM

## 2017-04-27 NOTE — Progress Notes (Addendum)
ANTICOAGULATION CONSULT NOTE   Pharmacy Consult for heparin Indication: heart pump  No Active Allergies  Patient Measurements: Height: 6' (182.9 cm) Weight: 214 lb 11.7 oz (97.4 kg) IBW/kg (Calculated) : 77.6  Vital Signs: Temp: 98.2 F (36.8 C) (07/08 0400) Temp Source: Oral (07/08 0400) BP: 77/65 (07/07 2200) Pulse Rate: 86 (07/08 0700)  Labs:  Recent Labs  04/25/17 0452  04/26/17 0357 04/26/17 1739 04/27/17 0421  HGB 11.0*  --  8.8*  --  8.6*  HCT 33.4*  --  27.2*  --  26.6*  PLT 380  --  355  --  327  LABPROT 26.6*  --  24.6*  --  21.1*  INR 2.40  --  2.17  --  1.80  CREATININE 3.00*  < > 3.26* 3.33* 3.20*  < > = values in this interval not displayed.  Estimated Creatinine Clearance: 31.9 mL/min (A) (by C-G formula based on SCr of 3.2 mg/dL (H)).   Medical History: Past Medical History:  Diagnosis Date  . AICD (automatic cardioverter/defibrillator) present 09/2015   [facility] [employer] [place] VR model [id number] (serial Number [id number]) ICD   . CHF (congestive heart failure) (HCC)   . Chronic edema   . CMI (chronic mesenteric ischemia) (HCC)   . Coronary artery disease   . Dyspnea   . Gout   . Gout   . Hypertension   . Ischemic cardiomyopathy    EF 25%  . Myocardial infarction [facility](HCC)    "they saw I'd had one; not sure when but it was before 2012"  . Obesity   . Renal insufficiency   . Type II diabetes mellitus [facility](HCC)     Assessment: 56 year old male s/p heartware VAD on 6/28, course complicated by post-op ileus. INR was therapeutic then warfarin was held due to ileus and INR now trending down and is currently below goal. Two days of therapy missed, dosing resumed by surgery yesterday. Will start heparin bridge this morning.   Patient has not previously been on heparin infusion, will start conservative and adjust as needed.   Goal of Therapy:  INR goal 2-3 Heparin level 0.3-0.7 units/ml Monitor platelets by anticoagulation protocol: Yes   Plan:   Warfarin 5mg  per MD Start heparin bridge at 1000 units/hr Check heparin level at noon  Sheppard CoilFrank Wilson PharmD., BCPS Clinical Pharmacist Pager [phone] 04/27/2017 7:34 AM  Addendum:  Initial heparin level undetectable on 1000 units/hr Will increase rate to 1300 units/hr and recheck HL tonight  04/27/2017  3:04 PM

## 2017-04-27 NOTE — Plan of Care (Signed)
Problem: Activity: Goal: Risk for activity intolerance will decrease Outcome: Progressing While off ventilator, Pt tolerates OOB to chair with 2-3 person assist for extended periods of time. Orders placed for Inpatient rehab per MD. Physical tyherapy working with Pt.  Problem: Respiratory: Goal: Ability to maintain adequate ventilation will improve Outcome: Progressing Pt tolerating re-extubation after being re-intubated 04-24-17 for possible aspiration. On room air, Pt maintaining O2 sats . 96 % and able to demonstrate I.S. and flutter valve. Lung sound remain clear/ diminished throughout with a congested non-productive cough.

## 2017-04-27 NOTE — Progress Notes (Signed)
10 Days Post-Op Procedure(s) (LRB): MEDIASTINAL EXPLORATION (N/A) Subjective: Much improved- OOB to chair Co-ox low this am - resume milrinone BM x 3  - start clear liqs INR sub therapeutic- heparin bridge, increase coumadin dose Renal fx getting better- lungs wet, cvp 13 - give am lasix Objective: Vital signs in last 24 hours: Temp:  [98 F (36.7 C)-98.4 F (36.9 C)] 98.2 F (36.8 C) (07/08 0400) Pulse Rate:  [39-86] 86 (07/08 0700) Cardiac Rhythm: Atrial paced (07/07 2000) Resp:  [19-33] 33 (07/08 0700) BP: (77)/(65) 77/65 (07/07 2200) SpO2:  [94 %-100 %] 97 % (07/08 0700) Arterial Line BP: (70-90)/(65-78) 88/78 (07/08 0700) Weight:  [214 lb 11.7 oz (97.4 kg)] 214 lb 11.7 oz (97.4 kg) (07/08 0700)  Hemodynamic parameters for last 24 hours: CVP:  [10 mmHg-13 mmHg] 13 mmHg  Intake/Output from previous day: 07/07 0701 - 07/08 0700 In: 2420.7 [I.V.:1890.7; NG/GT:30; IV Piggyback:500] Out: 570 [Urine:570] Intake/Output this shift: No intake/output data recorded.       Exam    General- alert and comfortable   Lungs- clear without rales, wheezes   Cor- regular rate and rhythm, no murmur , gallop   Abdomen- soft, non-tender   Extremities - warm, non-tender, minimal edema   Neuro- oriented, appropriate, no focal weakness   Lab Results:  Recent Labs  04/26/17 0357 04/27/17 0421  WBC 22.4* 17.6*  HGB 8.8* 8.6*  HCT 27.2* 26.6*  PLT 355 327   BMET:  Recent Labs  04/26/17 1739 04/27/17 0421  NA 129* 130*  K 3.5 4.0  CL 99* 99*  CO2 19* 21*  GLUCOSE 194* 162*  BUN 72* 74*  CREATININE 3.33* 3.20*  CALCIUM 7.9* 8.1*    PT/INR:  Recent Labs  04/27/17 0421  LABPROT 21.1*  INR 1.80   ABG    Component Value Date/Time   PHART 7.296 (L) 04/26/2017 0350   HCO3 20.9 04/26/2017 0350   TCO2 23 04/24/2017 1919   ACIDBASEDEF 4.5 (H) 04/26/2017 0350   O2SAT 49.5 04/27/2017 0435   CBG (last 3)   Recent Labs  04/26/17 1128 04/26/17 1618 04/26/17 1948   GLUCAP 192* 179* 163*    Assessment/Plan: S/P Procedure(s) (LRB): MEDIASTINAL EXPLORATION (N/A) Cont TPN for severe protein malnutrition Tracheal aspirate with cocci, rods- cont dose adjusted vanco and Zosyn DC a-line  LOS: 16 days    Kathlee Nationseter Van Trigt III 04/27/2017

## 2017-04-27 NOTE — Progress Notes (Addendum)
A.M. Co-ox results questionable, drop from 76.3 to 43.5 sats. Specimen redrawn and sent for clarification. MD to be notified.   Repeat Co-ox verified at 49.5 %

## 2017-04-27 NOTE — Progress Notes (Addendum)
ANTICOAGULATION CONSULT NOTE   Pharmacy Consult for heparin Indication: heart pump  No Active Allergies  Patient Measurements: Height: 6' (182.9 cm) Weight: 214 lb 11.7 oz (97.4 kg) IBW/kg (Calculated) : 77.6   Vital Signs: Temp: 98.2 F (36.8 C) (07/08 2000) Temp Source: Oral (07/08 2000) BP: 86/73 (07/08 2000) Pulse Rate: 87 (07/08 2200)  Labs:  Recent Labs  04/25/17 0452  04/26/17 0357 04/26/17 1739 04/27/17 0421 04/27/17 1245 04/27/17 2100  HGB 11.0*  --  8.8*  --  8.6*  --   --   HCT 33.4*  --  27.2*  --  26.6*  --   --   PLT 380  --  355  --  327  --   --   LABPROT 26.6*  --  24.6*  --  21.1*  --   --   INR 2.40  --  2.17  --  1.80  --   --   HEPARINUNFRC  --   --   --   --   --  <0.10* <0.10*  CREATININE 3.00*  < > 3.26* 3.33* 3.20*  --   --   < > = values in this interval not displayed.  Estimated Creatinine Clearance: 31.9 mL/min (A) (by C-G formula based on SCr of 3.2 mg/dL (H)).   Medical History: Past Medical History:  Diagnosis Date  . AICD (automatic cardioverter/defibrillator) present 09/2015   [facility] [employer] Hagerman[employer] VR model [id number] (serial Number [id number]) ICD   . CHF (congestive heart failure) (HCC)   . Chronic edema   . CMI (chronic mesenteric ischemia) (HCC)   . Coronary artery disease   . Dyspnea   . Gout   . Gout   . Hypertension   . Ischemic cardiomyopathy    EF 25%  . Myocardial infarction [facility](HCC)    "they saw I'd had one; not sure when but it was before 2012"  . Obesity   . Renal insufficiency   . Type II diabetes mellitus [facility](HCC)     Assessment: 56 year old male s/p heartware VAD on 6/28, course complicated by post-op ileus. INR was therapeutic then warfarin was held due to ileus and INR now trending down and is currently below goal. Two days of therapy missed, dosing resumed by surgery yesterday. Starting heparin bridge per pharmacy.   Heparin level undetectable with no interruptions in heparin gtt per RN. CBC stable - no  bleeding noted.   Goal of Therapy:  INR goal 2-3 Heparin level 0.3-0.7 units/ml Monitor platelets by anticoagulation protocol: Yes   Plan:  Increase heparin gtt to 1600 units/hr Heparin level with AM labs  Daily heparin level and CBC  Monitor for s/s bleeding   York CeriseKatherine Cook, PharmD Clinical Pharmacist 04/27/17 10:19 PM

## 2017-04-28 ENCOUNTER — Inpatient Hospital Stay (HOSPITAL_COMMUNITY): Payer: Medicare PPO

## 2017-04-28 DIAGNOSIS — I25810 Atherosclerosis of coronary artery bypass graft(s) without angina pectoris: Secondary | ICD-10-CM

## 2017-04-28 DIAGNOSIS — I5043 Acute on chronic combined systolic (congestive) and diastolic (congestive) heart failure: Secondary | ICD-10-CM

## 2017-04-28 DIAGNOSIS — R0682 Tachypnea, not elsewhere classified: Secondary | ICD-10-CM

## 2017-04-28 DIAGNOSIS — N189 Chronic kidney disease, unspecified: Secondary | ICD-10-CM

## 2017-04-28 DIAGNOSIS — D638 Anemia in other chronic diseases classified elsewhere: Secondary | ICD-10-CM

## 2017-04-28 DIAGNOSIS — I509 Heart failure, unspecified: Secondary | ICD-10-CM

## 2017-04-28 DIAGNOSIS — Z8739 Personal history of other diseases of the musculoskeletal system and connective tissue: Secondary | ICD-10-CM

## 2017-04-28 DIAGNOSIS — K567 Ileus, unspecified: Secondary | ICD-10-CM

## 2017-04-28 DIAGNOSIS — E119 Type 2 diabetes mellitus without complications: Secondary | ICD-10-CM

## 2017-04-28 DIAGNOSIS — Z95811 Presence of heart assist device: Secondary | ICD-10-CM

## 2017-04-28 DIAGNOSIS — I4891 Unspecified atrial fibrillation: Secondary | ICD-10-CM

## 2017-04-28 LAB — DIFFERENTIAL
BASOS ABS: 0 10*3/uL (ref 0.0–0.1)
BASOS PCT: 0 %
EOS ABS: 0.4 10*3/uL (ref 0.0–0.7)
Eosinophils Relative: 2 %
Lymphocytes Relative: 8 %
Lymphs Abs: 1.4 10*3/uL (ref 0.7–4.0)
MONOS PCT: 6 %
Monocytes Absolute: 1 10*3/uL (ref 0.1–1.0)
NEUTROS ABS: 14.7 10*3/uL — AB (ref 1.7–7.7)
Neutrophils Relative %: 84 %

## 2017-04-28 LAB — CULTURE, RESPIRATORY W GRAM STAIN

## 2017-04-28 LAB — HEPARIN LEVEL (UNFRACTIONATED): Heparin Unfractionated: 0.23 IU/mL — ABNORMAL LOW (ref 0.30–0.70)

## 2017-04-28 LAB — CBC
HEMATOCRIT: 23.6 % — AB (ref 39.0–52.0)
Hemoglobin: 7.8 g/dL — ABNORMAL LOW (ref 13.0–17.0)
MCH: 29 pg (ref 26.0–34.0)
MCHC: 33.1 g/dL (ref 30.0–36.0)
MCV: 87.7 fL (ref 78.0–100.0)
PLATELETS: 322 10*3/uL (ref 150–400)
RBC: 2.69 MIL/uL — ABNORMAL LOW (ref 4.22–5.81)
RDW: 18.9 % — AB (ref 11.5–15.5)
WBC: 17.5 10*3/uL — AB (ref 4.0–10.5)

## 2017-04-28 LAB — PREALBUMIN: Prealbumin: <5 mg/dL — ABNORMAL LOW (ref 18–38)

## 2017-04-28 LAB — GLUCOSE, CAPILLARY
GLUCOSE-CAPILLARY: 176 mg/dL — AB (ref 65–99)
GLUCOSE-CAPILLARY: 138 mg/dL — AB (ref 65–99)
GLUCOSE-CAPILLARY: 160 mg/dL — AB (ref 65–99)
Glucose-Capillary: 132 mg/dL — ABNORMAL HIGH (ref 65–99)
Glucose-Capillary: 153 mg/dL — ABNORMAL HIGH (ref 65–99)
Glucose-Capillary: 184 mg/dL — ABNORMAL HIGH (ref 65–99)
Glucose-Capillary: 190 mg/dL — ABNORMAL HIGH (ref 65–99)
Glucose-Capillary: 206 mg/dL — ABNORMAL HIGH (ref 65–99)
Glucose-Capillary: 218 mg/dL — ABNORMAL HIGH (ref 65–99)
Glucose-Capillary: 580 mg/dL (ref 65–99)

## 2017-04-28 LAB — MAGNESIUM: MAGNESIUM: 2.1 mg/dL (ref 1.7–2.4)

## 2017-04-28 LAB — COOXEMETRY PANEL
Carboxyhemoglobin: 1.8 % — ABNORMAL HIGH (ref 0.5–1.5)
Methemoglobin: 0.9 % (ref 0.0–1.5)
O2 SAT: 69.1 %
TOTAL HEMOGLOBIN: 13.5 g/dL (ref 12.0–16.0)

## 2017-04-28 LAB — POCT I-STAT 3, ART BLOOD GAS (G3+)
ACID-BASE DEFICIT: 4 mmol/L — AB (ref 0.0–2.0)
Bicarbonate: 20.2 mmol/L (ref 20.0–28.0)
O2 Saturation: 92 %
PH ART: 7.406 (ref 7.350–7.450)
TCO2: 21 mmol/L (ref 0–100)
pCO2 arterial: 32.1 mmHg (ref 32.0–48.0)
pO2, Arterial: 64 mmHg — ABNORMAL LOW (ref 83.0–108.0)

## 2017-04-28 LAB — PREPARE RBC (CROSSMATCH)

## 2017-04-28 LAB — COMPREHENSIVE METABOLIC PANEL
ALK PHOS: 104 U/L (ref 38–126)
ALT: 8 U/L — AB (ref 17–63)
ANION GAP: 9 (ref 5–15)
AST: 16 U/L (ref 15–41)
Albumin: 2.2 g/dL — ABNORMAL LOW (ref 3.5–5.0)
BILIRUBIN TOTAL: 1 mg/dL (ref 0.3–1.2)
BUN: 80 mg/dL — ABNORMAL HIGH (ref 6–20)
CALCIUM: 7.7 mg/dL — AB (ref 8.9–10.3)
CO2: 20 mmol/L — AB (ref 22–32)
CREATININE: 3.2 mg/dL — AB (ref 0.61–1.24)
Chloride: 101 mmol/L (ref 101–111)
GFR, EST AFRICAN AMERICAN: 24 mL/min — AB (ref >60)
GFR, EST NON AFRICAN AMERICAN: 20 mL/min — AB (ref >60)
Glucose, Bld: 154 mg/dL — ABNORMAL HIGH (ref 65–99)
Potassium: 3.7 mmol/L (ref 3.5–5.1)
SODIUM: 130 mmol/L — AB (ref 135–145)
TOTAL PROTEIN: 4.9 g/dL — AB (ref 6.5–8.1)

## 2017-04-28 LAB — PROTIME-INR
INR: 1.81
Prothrombin Time: 21.2 seconds — ABNORMAL HIGH (ref 11.4–15.2)

## 2017-04-28 LAB — LACTATE DEHYDROGENASE: LDH: 163 U/L (ref 98–192)

## 2017-04-28 LAB — TRIGLYCERIDES: TRIGLYCERIDES: 38 mg/dL (ref <150)

## 2017-04-28 LAB — PHOSPHORUS: PHOSPHORUS: 3 mg/dL (ref 2.5–4.6)

## 2017-04-28 MED ORDER — FUROSEMIDE 10 MG/ML IJ SOLN
80.0000 mg | Freq: Once | INTRAMUSCULAR | Status: AC
  Administered 2017-04-28: 80 mg via INTRAVENOUS
  Filled 2017-04-28: qty 8

## 2017-04-28 MED ORDER — SODIUM CHLORIDE 0.9 % IV SOLN
Freq: Once | INTRAVENOUS | Status: AC
  Administered 2017-04-28: 14:00:00 via INTRAVENOUS

## 2017-04-28 MED ORDER — POTASSIUM CHLORIDE 10 MEQ/50ML IV SOLN
10.0000 meq | INTRAVENOUS | Status: AC
  Administered 2017-04-28 (×2): 10 meq via INTRAVENOUS
  Filled 2017-04-28 (×2): qty 50

## 2017-04-28 MED ORDER — FUROSEMIDE 10 MG/ML IJ SOLN
80.0000 mg | Freq: Once | INTRAMUSCULAR | Status: AC
  Administered 2017-04-28: 80 mg via INTRAVENOUS

## 2017-04-28 MED ORDER — TRACE MINERALS CR-CU-MN-SE-ZN 10-1000-500-60 MCG/ML IV SOLN
INTRAVENOUS | Status: AC
  Administered 2017-04-28: 18:00:00 via INTRAVENOUS
  Filled 2017-04-28: qty 2520

## 2017-04-28 MED ORDER — FUROSEMIDE 10 MG/ML IJ SOLN
INTRAMUSCULAR | Status: AC
  Filled 2017-04-28: qty 8

## 2017-04-28 MED ORDER — WARFARIN SODIUM 7.5 MG PO TABS
7.5000 mg | ORAL_TABLET | Freq: Once | ORAL | Status: AC
  Administered 2017-04-28: 7.5 mg via ORAL
  Filled 2017-04-28: qty 1

## 2017-04-28 NOTE — Progress Notes (Signed)
Patient ID: James [name], male   DOB: 28-Feb-1961, 56 y.o.   MRN: [id number] HVAD Rounding Note  Postop day11 HVAD implantation for ischemic cardiomyopathy  Subjective:   Postop ileus with aspiration requiring reintubation-now resolved Postoperative acute on chronic renal insufficiency-creatinine plateaued at 3.2 Severe protein deficiency malnutrition on TPN History of ventricular arrhythmias status post AICD Postoperative expected low blood loss anemia Severe deconditioning Postoperative RV dysfunction on milrinone for adequate cardiac output  Patient overall feels much stronger. He is tolerating full liquids and had a bowel movement. He is able to get up out of bed to chair and will begin ambulation again with physical therapy  LVAD INTERROGATION:  HVAD:  Flow 5.8 liters/min, speed 2820, power 5.5 VAD pulsatility satisfactory-LDH remains in the low range and he is on IV heparin bridge until INR reaches greater than 2.0   Objective:    Vital Signs:   Temp:  [98.1 F (36.7 C)-99 F (37.2 C)] 99 F (37.2 C) (07/09 1659) Pulse Rate:  [29-88] 85 (07/09 1700) Resp:  [19-32] 29 (07/09 1700) BP: (80-102)/(65-82) 102/82 (07/09 1700) SpO2:  [88 %-100 %] 98 % (07/09 1700) Arterial Line BP: (70-94)/(61-75) 70/61 (07/09 0900) Weight:  [220 lb 7.4 oz (100 kg)] 220 lb 7.4 oz (100 kg) (07/09 0600) Last BM Date: 04/28/17 Mean arterial Pressure 70-85  Intake/Output:   Intake/Output Summary (Last 24 hours) at 04/28/17 1750 Last data filed at 04/28/17 1700  Gross per 24 hour  Intake          4340.98 ml  Output             1060 ml  Net          3280.98 ml     Physical Exam: General:  Looks weak but He is very positive in sitting up in the chair Cor: distant heart sounds with LVAD hum present. Lungs: few crackles in lower lobes Abdomen: soft, nontender, nondistended. Hypoactive BS.  Extremities: mild edema Neuro: alert & orientedx3.  moves all 4 extremities w/o difficulty. Affect  pleasant  Telemetry: atrial paced 86  Labs: Basic Metabolic Panel:  Recent Labs Lab 04/24/17 0238  04/25/17 0452 04/25/17 2000 04/26/17 0357 04/26/17 1739 04/27/17 0421 04/28/17 0334  NA 133*  < > 131* 132* 132* 129* 130* 130*  K 4.6  < > 4.3 3.7 4.0 3.5 4.0 3.7  CL 101  < > 100* 101 101 99* 99* 101  CO2 23  --  20* 22 21* 19* 21* 20*  GLUCOSE 98  < > 211* 171* 170* 194* 162* 154*  BUN 59*  < > 68* 69* 71* 72* 74* 80*  CREATININE 2.60*  < > 3.00* 3.26* 3.26* 3.33* 3.20* 3.20*  CALCIUM 7.8*  --  8.1* 7.6* 7.9* 7.9* 8.1* 7.7*  MG 1.8  --  1.9  --  2.0 2.1 1.9 2.1  PHOS 4.0  --  5.1*  --  4.8*  --  3.9 3.0  < > = values in this interval not displayed.  Liver Function Tests:  Recent Labs Lab 04/23/17 2225 04/24/17 0238 04/25/17 0452 04/26/17 0357 04/28/17 0334  AST 24 22 23 18 16   ALT 11* 9* 8* 8* 8*  ALKPHOS 88 86 95 78 104  BILITOT 1.2 1.1 1.3* 1.1 1.0  PROT 5.9* 5.1* 5.7* 5.2* 4.9*  ALBUMIN 2.5* 2.3* 2.6* 2.3* 2.2*    Recent Labs Lab 04/23/17 2225  LIPASE 93*  AMYLASE 91   No results for input(s): AMMONIA in  the last 168 hours.  CBC:  Recent Labs Lab 04/22/17 0339 04/23/17 0414 04/24/17 0238 04/24/17 1919 04/25/17 0452 04/26/17 0357 04/27/17 0421 04/28/17 0334  WBC 14.8* 11.2* 12.4*  --  20.4* 22.4* 17.6* 17.5*  NEUTROABS 12.4* 8.7* 11.6*  --  18.6*  --   --  14.7*  HGB 9.6* 9.9* 10.1* 10.2* 11.0* 8.8* 8.6* 7.8*  HCT 30.1* 31.0* 31.9* 30.0* 33.4* 27.2* 26.6* 23.6*  MCV 86.2 90.4 89.6  --  91.0 90.7 92.4 87.7  PLT 247 303 303  --  380 355 327 322    INR:  Recent Labs Lab 04/24/17 0238 04/25/17 0452 04/26/17 0357 04/27/17 0421 04/28/17 0334  INR 2.61 2.40 2.17 1.80 1.81    Other results:  LDH 219    Procalcitonin 4.6  LFT's normal  Albumin 2.6 and prealbumin < 5   Imaging: Dg Chest Port 1 View  Result Date: 04/28/2017 CLINICAL DATA:  LVAD EXAM: PORTABLE CHEST 1 VIEW COMPARISON:  04/27/2017 FINDINGS: Left subclavian AICD,  right upper extremity PICC, and LVAD device are stable. Heart remains mildly enlarged. Very low lung volumes with bibasilar atelectasis and vascular crowding are stable. Vascular congestion has improved. IMPRESSION: Improved vascular congestion. Continued low volumes and vascular crowding. Electronically Signed   By: Jolaine Click M.D.   On: 04/28/2017 07:40   Dg Chest Port 1 View  Result Date: 04/27/2017 CLINICAL DATA:  Left ventricular assist device. EXAM: PORTABLE CHEST 1 VIEW COMPARISON:  04/26/2017 FINDINGS: 0523 hours. Low lung volumes. The cardio pericardial silhouette is enlarged. Right PICC line tip now projects in the lower right atrium near the junction with the IVC. Left single lead pacer/ AICD again noted. Left ventricular assist device remains in place. There is vascular congestion with probable interstitial edema and basilar atelectasis. Diffuse gaseous bowel distention noted within the visualized upper abdomen. IMPRESSION: 1. Interval removal of NG to with right PICC line now projecting in the inferior right atrium near the junction with the IVC. 2. Cardiomegaly with vascular congestion and interstitial edema. 3. Diffuse gaseous bowel distention within the visualized upper abdomen. Electronically Signed   By: Kennith Center M.D.   On: 04/27/2017 07:14   Dg Abd Portable 1v  Result Date: 04/27/2017 CLINICAL DATA:  LVAD device.  Ileus. EXAM: PORTABLE ABDOMEN - 1 VIEW COMPARISON:  Common radiograph 04/26/2017. FINDINGS: Motion artifact limits evaluation. Re- demonstrated prominent colonic gas. Slight interval decrease in small bowel distention. Lumbar spine degenerative changes. IMPRESSION: Persistent prominent colonic gas pattern with mild interval decrease in small bowel distention. Electronically Signed   By: Annia Belt M.D.   On: 04/27/2017 07:25     Medications:     Scheduled Medications: . aspirin EC  325 mg Oral Daily   Or  . aspirin  324 mg Per Tube Daily   Or  . aspirin  300 mg  Rectal Daily  . bisacodyl  10 mg Oral Daily   Or  . bisacodyl  10 mg Rectal Daily  . Chlorhexidine Gluconate Cloth  6 each Topical Daily  . febuxostat  40 mg Oral Daily  . fentaNYL (SUBLIMAZE) injection  50 mcg Intravenous Once  . insulin aspart  0-24 Units Subcutaneous Q4H  . mouth rinse  15 mL Mouth Rinse BID  . sodium chloride flush  10-40 mL Intracatheter Q12H  . sodium chloride flush  3 mL Intravenous Q12H  . warfarin  7.5 mg Oral ONCE-1800  . Warfarin - Physician Dosing Inpatient   Does not apply  q1800    Infusions: . Marland KitchenTPN (CLINIMIX-E) Adult Stopped (04/28/17 1748)  . Marland KitchenTPN (CLINIMIX-E) Adult 105 mL/hr at 04/28/17 1748  . sodium chloride Stopped (04/19/17 1200)  . sodium chloride Stopped (04/21/17 1500)  . amiodarone 30 mg/hr (04/28/17 1104)  . heparin 2,100 Units/hr (04/28/17 1611)  . lactated ringers    . lactated ringers 10 mL/hr at 04/28/17 1346  . milrinone 0.25 mcg/kg/min (04/28/17 1248)  . norepinephrine (LEVOPHED) Adult infusion Stopped (04/26/17 1100)  . piperacillin-tazobactam (ZOSYN)  IV Stopped (04/28/17 1700)    PRN Medications: albuterol, docusate, hydrALAZINE, neomycin-bacitracin-polymyxin, ondansetron (ZOFRAN) IV, oxyCODONE, phenol, sodium chloride flush, sodium chloride flush, traMADol  Assessment/Plan/Discussion:    POD 11 HVAD Postoperative acute on chronic renal failure. Weight up today significantly. Patient receiving T NA. Will dose Lasix 80 mg IV one time   Chronic anemia felt to be due to chronic disease. Hgb 7.8 so patient will receive 1 unit of blood today  Gout: observe  Hx of arrhythmias preop: on IV amiodarone.  ICD in place but will wait to turn on therapies.   Severe malnutrition with prealbumin < 5: On TNA supplementing oral diet but will need to taper off TNA soon with renal failure.  Surgical incisions all clean and dry. No drainage around power cord exit site  I reviewed the LVAD parameters from today, and compared the  results to the patient's prior recorded data. LVAD equipment check completed andis in good working order. Back-up equipment present.   I discussed his clinical status with his wife at the bedside this am.    Length of Stay: 17  Kathlee Nations Trigt III 04/28/2017, 5:50 PM

## 2017-04-28 NOTE — Progress Notes (Signed)
LVAD Inpatient Coordinator Rounding Note:  Admitted 06/10/2017 due to A/C Heart failure.   [employer] LVAD implanted on 04/04/2017 by Dr. Laneta SimmersBartle as DT VAD.  Vital signs: HR: 85 (atrial paced) Doppler Pressure: 78 A-Line: removing today O2 Sat:  98 on Room Air Wt:207>209 > 213 >213 > 208>203>220lbs   LVAD interrogation reveals:  Speed:  2820 Flow:  5.7 Power:  5.1 Alarms: Pt had about 10 suction alarms since 7/6. Peak: 8.1 Trough: 4.1 HCT: 24-decreased to 24 Low flow alarm setting: 3.5 High watt alarm setting:   7.0  Suction:  Turned on Lavare cycle:  on  Blood Products: 6/28> 5 PRBC's, 6 FFP 7/1> 1 PRBC 7/9> 1 PRBC  Gtts: Milrinone restarted 7/8 @ 0.25 mcg/kg/min Levo stopped 04/26/17 Amiodarone 30 mg/hr Heparin 1900 units/hr  TPN - started 04/24/17 for nutritional support  Arrhythmia: 04/24/17 - Afib with RVR - started amiodarone  Respiratory: 04/23/17 - re-intubated due to respiratory failure secondary to suspected aspiration pneumonia 04/25/17-extubated  Drive Line: Daily Dressing Kits with Aquacell AG silver strips per protocol.   Labs:  LDH trend:160 (pre VAD)>227>215>191>212>207>222>217>219>163  INR trend: 1.31>1.45>1.44>1.25>1.33>1.68>2.61>2.4>1.81  Anticoagulation Plan: -INR Goal: 2-2.5 started 04/19/17 -ASA Dose: 325 mg  Adverse Events on VAD: -Return to OR 6/28  with high chest tube output, evacuation of mediastinal hematoma -Ileus with vomiting on 04/23/17; re-intubation for acute respiratory failure;  probable aspiration pneumonia  Back up controller programmed today with new speed, alarm settings and HCT.   Plan/Recommendations:  1. Will hold VAD teaching until patient stabilizes.  2. Continue daily dressing changes.  3. Pt will receive a unit of blood today.    Carlton AdamSarah Larico Dimock RN, VAD Coordinator 24/7 pager [phone]

## 2017-04-28 NOTE — Plan of Care (Signed)
Problem: Bowel/Gastric: Goal: Will not experience complications related to bowel motility Outcome: Progressing Pt with BMs 2-3 times daily, diet advanced to clear liquid.  Problem: Cardiac: Goal: Ability to maintain an adequate cardiac output will improve Outcome: Progressing Pt maintaining MAPs > 65 on current LVAD speed of 2820 with adequate, stable flow rates. Co-ox improving after addition of Milrinone per MD order. No s/s of bleeding as of right now with the addition of IV heparin per Pharmacy.

## 2017-04-28 NOTE — Progress Notes (Signed)
Patient ID: James [name], male   DOB: 1961/09/03, 56 y.o.   MRN: [id number]   Advanced Heart Failure VAD Team Note  Subjective:    HVAD placed 6/28.  Returned to the OR that evening with high chest tube output, evacuation of mediastinal hematoma.   Extubated 6/29. Milrinone stopped on 7/4.  Evening of 7/4, patient developed ileus with respiratory compromise. NGT placed with 3 L suctioned out.  CXR with suspicion for aspiration PNA.  Patient had to be intubated.  He went into atrial fibrillation with RVR.  He became hypotensive and was started on norepinephrine and phenylephrine.  Amiodarone gtt begun.     Extubated again on 7/6.   Yesterday CO-OX low so milrinone 0.25 mcg started. CXR better today. CVP 12. Remains on TNA. Tolerating clear liquids. Had BM today.   Denies SOB.   HVAD INTERROGATION:  HVAD:  Flow 5.6 liters/min, speed 2820, power 5 W,  Peak 8.6 Trough 4.   Objective:    Vital Signs:   Temp:  [97.6 F (36.4 C)-98.3 F (36.8 C)] 98.3 F (36.8 C) (07/09 0831) Pulse Rate:  [29-87] 87 (07/09 0900) Resp:  [19-33] 27 (07/09 0900) BP: (80-93)/(53-75) 80/69 (07/09 0800) SpO2:  [97 %-100 %] 99 % (07/09 0900) Arterial Line BP: (70-104)/(61-77) 70/61 (07/09 0900) Weight:  [220 lb 7.4 oz (100 kg)] 220 lb 7.4 oz (100 kg) (07/09 0600) Last BM Date: 04/27/17 Mean arterial Pressure  80s  Intake/Output:   Intake/Output Summary (Last 24 hours) at 04/28/17 1017 Last data filed at 04/28/17 0949  Gross per 24 hour  Intake          3425.53 ml  Output              910 ml  Net          2515.53 ml     Physical Exam    Physical Exam: CVP 12 GENERAL: Chronically ill. Walking the hall.  HEENT: normal  NECK: Supple, JVP 10-11  .  2+ bilaterally, no bruits.  No lymphadenopathy or thyromegaly appreciated.   CARDIAC:  Mechanical heart sounds with LVAD hum present.  LUNGS: Decreased in the bases on room air.   ABDOMEN:  Soft, round, nontender, positive bowel sounds x4.     LVAD exit  site: Dressing dry and intact.  No erythema or drainage.  Stabilization device present and accurately applied.  Driveline dressing is being changed daily per sterile technique. EXTREMITIES:  Warm and dry, no cyanosis, clubbing, rash or R and LLE 2+  edema  NEUROLOGIC:  Alert and oriented x 4.  Gait steady.  No aphasia.  No dysarthria.  Affect pleasant.      Telemetry    A paced V sensed in 80s  Personally reviewed    Labs   Basic Metabolic Panel:  Recent Labs Lab 04/24/17 0238  04/25/17 0452 04/25/17 2000 04/26/17 0357 04/26/17 1739 04/27/17 0421 04/28/17 0334  NA 133*  < > 131* 132* 132* 129* 130* 130*  K 4.6  < > 4.3 3.7 4.0 3.5 4.0 3.7  CL 101  < > 100* 101 101 99* 99* 101  CO2 23  --  20* 22 21* 19* 21* 20*  GLUCOSE 98  < > 211* 171* 170* 194* 162* 154*  BUN 59*  < > 68* 69* 71* 72* 74* 80*  CREATININE 2.60*  < > 3.00* 3.26* 3.26* 3.33* 3.20* 3.20*  CALCIUM 7.8*  --  8.1* 7.6* 7.9* 7.9* 8.1* 7.7*  MG  1.8  --  1.9  --  2.0 2.1 1.9 2.1  PHOS 4.0  --  5.1*  --  4.8*  --  3.9 3.0  < > = values in this interval not displayed.  Liver Function Tests:  Recent Labs Lab 04/23/17 2225 04/24/17 0238 04/25/17 0452 04/26/17 0357 04/28/17 0334  AST 24 22 23 18 16   ALT 11* 9* 8* 8* 8*  ALKPHOS 88 86 95 78 104  BILITOT 1.2 1.1 1.3* 1.1 1.0  PROT 5.9* 5.1* 5.7* 5.2* 4.9*  ALBUMIN 2.5* 2.3* 2.6* 2.3* 2.2*    Recent Labs Lab 04/23/17 2225  LIPASE 93*  AMYLASE 91   No results for input(s): AMMONIA in the last 168 hours.  CBC:  Recent Labs Lab 04/22/17 0339 04/23/17 0414 04/24/17 0238 04/24/17 1919 04/25/17 0452 04/26/17 0357 04/27/17 0421 04/28/17 0334  WBC 14.8* 11.2* 12.4*  --  20.4* 22.4* 17.6* 17.5*  NEUTROABS 12.4* 8.7* 11.6*  --  18.6*  --   --  14.7*  HGB 9.6* 9.9* 10.1* 10.2* 11.0* 8.8* 8.6* 7.8*  HCT 30.1* 31.0* 31.9* 30.0* 33.4* 27.2* 26.6* 23.6*  MCV 86.2 90.4 89.6  --  91.0 90.7 92.4 87.7  PLT 247 303 303  --  380 355 327 322     INR:  Recent Labs Lab 04/24/17 0238 04/25/17 0452 04/26/17 0357 04/27/17 0421 04/28/17 0334  INR 2.61 2.40 2.17 1.80 1.81    Other results:     Imaging   Dg Chest Port 1 View  Result Date: 04/28/2017 CLINICAL DATA:  LVAD EXAM: PORTABLE CHEST 1 VIEW COMPARISON:  04/27/2017 FINDINGS: Left subclavian AICD, right upper extremity PICC, and LVAD device are stable. Heart remains mildly enlarged. Very low lung volumes with bibasilar atelectasis and vascular crowding are stable. Vascular congestion has improved. IMPRESSION: Improved vascular congestion. Continued low volumes and vascular crowding. Electronically Signed   By: Jolaine Click M.D.   On: 04/28/2017 07:40   Dg Chest Port 1 View  Result Date: 04/27/2017 CLINICAL DATA:  Left ventricular assist device. EXAM: PORTABLE CHEST 1 VIEW COMPARISON:  04/26/2017 FINDINGS: 0523 hours. Low lung volumes. The cardio pericardial silhouette is enlarged. Right PICC line tip now projects in the lower right atrium near the junction with the IVC. Left single lead pacer/ AICD again noted. Left ventricular assist device remains in place. There is vascular congestion with probable interstitial edema and basilar atelectasis. Diffuse gaseous bowel distention noted within the visualized upper abdomen. IMPRESSION: 1. Interval removal of NG to with right PICC line now projecting in the inferior right atrium near the junction with the IVC. 2. Cardiomegaly with vascular congestion and interstitial edema. 3. Diffuse gaseous bowel distention within the visualized upper abdomen. Electronically Signed   By: Kennith Center M.D.   On: 04/27/2017 07:14   Dg Abd Portable 1v  Result Date: 04/27/2017 CLINICAL DATA:  LVAD device.  Ileus. EXAM: PORTABLE ABDOMEN - 1 VIEW COMPARISON:  Common radiograph 04/26/2017. FINDINGS: Motion artifact limits evaluation. Re- demonstrated prominent colonic gas. Slight interval decrease in small bowel distention. Lumbar spine degenerative  changes. IMPRESSION: Persistent prominent colonic gas pattern with mild interval decrease in small bowel distention. Electronically Signed   By: Annia Belt M.D.   On: 04/27/2017 07:25     Medications:     Scheduled Medications: . aspirin EC  325 mg Oral Daily   Or  . aspirin  324 mg Per Tube Daily   Or  . aspirin  300 mg Rectal Daily  .  bisacodyl  10 mg Oral Daily   Or  . bisacodyl  10 mg Rectal Daily  . Chlorhexidine Gluconate Cloth  6 each Topical Daily  . febuxostat  40 mg Oral Daily  . fentaNYL (SUBLIMAZE) injection  50 mcg Intravenous Once  . insulin aspart  0-24 Units Subcutaneous Q4H  . mouth rinse  15 mL Mouth Rinse BID  . sodium chloride flush  10-40 mL Intracatheter Q12H  . sodium chloride flush  3 mL Intravenous Q12H  . warfarin  7.5 mg Oral ONCE-1800  . Warfarin - Physician Dosing Inpatient   Does not apply q1800    Infusions: . Marland Kitchen.TPN (CLINIMIX-E) Adult 80 mL/hr at 04/28/17 0700  . sodium chloride Stopped (04/19/17 1200)  . sodium chloride Stopped (04/21/17 1500)  . amiodarone 30 mg/hr (04/28/17 0700)  . heparin 1,900 Units/hr (04/28/17 0700)  . lactated ringers    . lactated ringers 20 mL/hr at 04/28/17 0700  . milrinone 0.25 mcg/kg/min (04/27/17 2300)  . norepinephrine (LEVOPHED) Adult infusion Stopped (04/26/17 1100)  . piperacillin-tazobactam (ZOSYN)  IV Stopped (04/28/17 0800)    PRN Medications: albuterol, docusate, hydrALAZINE, neomycin-bacitracin-polymyxin, ondansetron (ZOFRAN) IV, oxyCODONE, phenol, sodium chloride flush, sodium chloride flush, traMADol   Patient Profile   56 yo with CAD s/p CABG, ischemic cardiomyopathy/chronic systolic CHF, tophaceous gout, and CKD stage 3 was admitted for diuresis and consideration for LVAD placement. S/p HVAD on 6/28  Assessment/Plan:    1. Acute/chronic systolic CHF: Ischemic cardiomyopathy.  St Jude ICD.  Echo (6/18) with EF 15%, mildly dilated RV with moderately decreased systolic function.  RHC 6/22 with  elevated filling pressures and evidence of RV failure though PAPi score 1.95 so not prohibitive.  [facility] with severe tophaceous gout and concern for worsening on transplant meds.  He was diuresed extensively, repeat RHC 6/26 with marked improvement in filling pressures and stable cardiac output on milrinone.  HVAD placement 6/28 and had to return to OR to evacuate mediastinal hematoma.  He had been weaned off pressors and milrinone, but developed ileus and likely aspiration event 7/4, re-intubated, developed afib/RVR, and now back on norepinephrine (phenylephrine stopped).  On 7/6, he was extubated.  -Todays CO-OX is 69% on 0.25 mcg milrinone.   Increase lasix to 80 mg twice daily. Renal function unchanged.  - On ASA 325 mg daily. INR 1.8. Back on heparin + coumadin  - Off dig with elevated creatinine.    2. AKI on CKD stage 3: Suspect a component of peri-op ATN, worsened with ileus, vomiting, intubation/sedation.   -Creatinine unchanged 3.2.  Keep MAP > 70 3. Anemia: Chronic, seen by GI pre-op, they did not feel that he needed endoscopy.  FOBT negative initially. He had 1 unit PRBCs 7/1 with appropriate bump.  No overt bleeding. Hgb 7.8 Give 1UPRBC.  4. CAD: s/p CABG 2012.   - Stable.   5. Gout: Severe tophaceous gout. No complaint of gouty pain today.  - Continue uloric. Holding colchicine with AKI.     6. Atrial fibrillation: Developed atrial fibrillation with RVR in setting of aspiration PNA on 7/4.  Amiodarone gtt begun, now out of atrial fibrillation (a-paced).    - Transition to po amiodarone tomorrow if he tolerates full liquid diet.   - On coumadin per Dr Maren BeachVantrigt.  7. Malnutrition: Major issue with baseline poor nutritional status and now with ileus.  Albumin 2.2  Nutrition following, now getting TNA.   - 8. Aspiration  PNA with acute hypoxemic respiratory failure on 7/4: He was extubated on 7/6.  - WBC unchanged 17.5. Afebrile - Tracheal  aspirate with Klebsiella and citrobacter. Both sensitive to Zosyn.  - Continue Zosyn.  9. Ileus: Vomiting on 7/4 with aspiration PNA.  Resolving.  -10. Deconditioning: - PT following. Consult CIR.    I reviewed the HVAD parameters from today, and compared the results to the patient's prior recorded data.  No programming changes were made.  The HVAD is functioning within specified parameters.     Tonye Becket, NP 04/28/2017, 10:17 AM  VAD Team --- VAD ISSUES ONLY--- Pager [phone] (7am - 7am)  Advanced Heart Failure Team  Pager [phone] (M-F; 7a - 4p)  Please contact [facility] for night-coverage after hours (4p -7a ) and weekends on [email or web address]  Patient seen and examined with Tonye Becket, NP. We discussed all aspects of the encounter. I agree with the assessment and plan as stated above.   Remains tenuous but improving slowly. He is volume overloaded. Agree with IV lasix.   Nutrition is poor with very low pre-albumin. Continue TPN. Ileus seems to be resolving. Advance diet slowly.   Co-ox stable on milrinone 0.25. Will continue for RV support.   Continue Zosyn for aspiration PNA with Klebsiella and Citrobacter.   Creatinine plateaued at 3.2 for now. Hopefully wil continue to improve.  VAD parameters stable.  Arvilla Meres, MD  11:36 PM

## 2017-04-28 NOTE — Progress Notes (Signed)
Physical Therapy Treatment Patient Details Name: James [name] MRN: [id number] DOB: 1961-10-13 Today's Date: 04/28/2017    History of Present Illness  Pt adm with acute on chronic heart failure and underwent Heartware HVAD implant on 6/28. Returned to OR later that day for evacuation of mediastinal hematoma. On 7/4 developed ileus with likely aspiration and intubated 7/4-7/6. PMH - gout, DMII, HTN, CKD, CAD S/P CABGx6 2012, chronic systolic heart failure and [employer] ICD. presents now s/p heartware HVAD implant    PT Comments    Pt making steady progress after multiple complications. Continue to feel pt could benefit from CIR.   Follow Up Recommendations  CIR;Supervision for mobility/OOB     Equipment Recommendations  Other (comment) (TBD)    Recommendations for Other Services Rehab consult     Precautions / Restrictions Precautions Precautions: Sternal Precaution Comments: Heartware HVAD, external pacer wires, A-line Restrictions Weight Bearing Restrictions: Yes Other Position/Activity Restrictions: Sternal precautions    Mobility  Bed Mobility Overal bed mobility: Needs Assistance Bed Mobility: Sit to Supine       Sit to supine: +2 for physical assistance;Mod assist   General bed mobility comments: Assist to lower trunk and to bring legs back up into bed  Transfers Overall transfer level: Needs assistance Equipment used: Rolling walker (2 wheeled) Transfers: Sit to/from Stand Sit to Stand: Mod assist;+2 physical assistance         General transfer comment: Assist to bring hips and trunk up. Had pt place hands on knees to rise and to return to sitting  Ambulation/Gait Ambulation/Gait assistance: Min assist;+2 safety/equipment Ambulation Distance (Feet): 190 Feet Assistive device: Pushed wheelchair Gait Pattern/deviations: Step-through pattern;Decreased step length - right;Decreased step length - left;Shuffle;Trunk flexed Gait velocity: decr Gait velocity  interpretation: Below normal speed for age/gender General Gait Details: Assist for balance and support. Pt required 2 standing rest breaks. HR 117 with amb. SpO2 >90% on RA.   Stairs            Wheelchair Mobility    Modified Rankin (Stroke Patients Only)       Balance Overall balance assessment: Needs assistance Sitting-balance support: No upper extremity supported;Feet supported Sitting balance-Leahy Scale: Fair     Standing balance support: Bilateral upper extremity supported Standing balance-Leahy Scale: Poor Standing balance comment: UE support and min guard for static standing                            Cognition Arousal/Alertness: Awake/alert Behavior During Therapy: Flat affect Overall Cognitive Status: Within Functional Limits for tasks assessed                                        Exercises      General Comments        Pertinent Vitals/Pain Pain Assessment: Faces Faces Pain Scale: Hurts little more Pain Location: Sore throat Pain Descriptors / Indicators: Grimacing (Low/soft speech) Pain Intervention(s): Limited activity within patient's tolerance    Home Living                      Prior Function            PT Goals (current goals can now be found in the care plan section) Progress towards PT goals: Progressing toward goals    Frequency    Min 3X/week  PT Plan Current plan remains appropriate    Co-evaluation              AM-PAC PT "6 Clicks" Daily Activity  Outcome Measure  Difficulty turning over in bed (including adjusting bedclothes, sheets and blankets)?: Total Difficulty moving from lying on back to sitting on the side of the bed? : Total Difficulty sitting down on and standing up from a chair with arms (e.g., wheelchair, bedside commode, etc,.)?: Total Help needed moving to and from a bed to chair (including a wheelchair)?: A Lot Help needed walking in hospital room?: A  Lot Help needed climbing 3-5 steps with a railing? : A Lot 6 Click Score: 9    End of Session   Activity Tolerance: Patient tolerated treatment well Patient left: with call bell/phone within reach;in bed Nurse Communication: Mobility status PT Visit Diagnosis: Unsteadiness on feet (R26.81);Muscle weakness (generalized) (M62.81);Difficulty in walking, not elsewhere classified (R26.2)     Time: 1610-9604 PT Time Calculation (min) (ACUTE ONLY): 18 min  Charges:  $Gait Training: 23-37 mins                    G Codes:       [facility] PT [phone]    Angelina Ok [facility] 04/28/2017, 11:26 AM

## 2017-04-28 NOTE — Progress Notes (Signed)
CSW attempted to visit with patient at bedside although patient was asleep. CSW received report that patient is making progress after set back last week. CSW will continue to follow throughout implant hospitalization. Lasandra BeechJackie Lowanda Cashaw, LCSW, CCSW-MCS [phone]

## 2017-04-28 NOTE — Consult Note (Signed)
Physical Medicine and Rehabilitation Consult Reason for Consult: Debilitation Referring Physician: Dr. Caffie Pinto   HPI: James [name] is a 56 y.o. right handed male with history of gout, diastolic congestive heart failure, chronic mesenteric ischemia, CAD with CABG, ischemic cardiomyopathy with AICD with ejection fraction of 15%, renal insufficiency and diabetes mellitus. Per chart review and patient, patient lives with spouse in [place]. Independent prior to admission and active working part time. Multilevel home. Wife works during the day mother close by who can provide assistance. Patient evaluated in the past for transplant was not felt to be a candidate due to mobility limitations. Presented 04/14/2017 with increasing fatigue and progressive increase in creatinine. Noted hemoglobin 7.7. Gastroenterology consulted for anemia and no evidence of GI bleeding at this time and continue to monitor. Palliative care also consulted to establish goals of care. Cardiology continues to follow patient stabilize underwent LVAD 03/31/2017 per Dr. Cyndia Bent with postoperative mediastinal bleeding underwent evacuation of mediastinal hematoma ligation of bleeding points 04/18/2017.Marland Kitchen Patient required intubation for a short time. Patient developed ileus with respiratory compromise and nasogastric tube was placed chest x-ray suspicious for pneumonia. He went into atrial fibrillation with RVR became hypotensive started on norepinephrine and amiodarone drip. Coumadin has since been initiated monitoring for any bleeding episodes. Contact precautions for MRSA PCR screen positive. Physical therapy evaluation completed and ongoing recommendations of physical medicine rehabilitation consult. Patient with sternal precautions.   Review of Systems  Constitutional: Positive for malaise/fatigue. Negative for chills and fever.  HENT: Negative for hearing loss.   Eyes: Negative for blurred vision and double vision.    Respiratory: Positive for shortness of breath. Negative for cough.   Cardiovascular: Positive for leg swelling. Negative for chest pain.  Gastrointestinal: Negative for diarrhea, nausea and vomiting.  Genitourinary: Negative for dysuria, flank pain and hematuria.  Musculoskeletal: Positive for joint pain and myalgias.  Skin: Negative for rash.  Neurological: Negative for seizures.  All other systems reviewed and are negative.  Past Medical History:  Diagnosis Date  . AICD (automatic cardioverter/defibrillator) present 09/2015   [facility] [place] VR model [id number] (serial Number [id number]) ICD   . CHF (congestive heart failure) ([place])   . Chronic edema   . CMI (chronic mesenteric ischemia) ([place])   . Coronary artery disease   . Dyspnea   . Gout   . Gout   . Hypertension   . Ischemic cardiomyopathy    EF 25%  . Myocardial infarction [facility])    "they saw I'd had one; not sure when but it was before 2012"  . Obesity   . Renal insufficiency   . Type II diabetes mellitus ([place])    Past Surgical History:  Procedure Laterality Date  . CARDIAC CATHETERIZATION  11/2010  . CARDIAC CATHETERIZATION N/A 07/15/2016   Procedure: Right Heart Cath;  Surgeon: Larey Dresser, MD;  Location: [place] [facility];  Service: Cardiovascular;  Laterality: N/A;  . CORONARY ARTERY BYPASS GRAFT  11/2010   Sequential left internal mammary graft to the mid and distal LAD, sequential sapenous vein graft to the first, second, and third obutuse marginal branches  of the  left circumflex  coronary artery.  Saphenous vein graft to the distal right coronary artery.  . EP IMPLANTABLE DEVICE N/A 10/06/2015   Procedure: ICD Implant;  Surgeon: Will Meredith Leeds, MD; [place] [id number] (serial Number [id number]) ICD implanted L chest   . INSERTION OF IMPLANTABLE LEFT VENTRICULAR  ASSIST DEVICE N/A 04/06/2017   Procedure: INSERTION OF IMPLANTABLE LEFT VENTRICULAR ASSIST DEVICE;  Surgeon:  Gaye Pollack, MD;  Location: [place];  Service: Open Heart Surgery;  Laterality: N/A;  CIRC ARREST  NITRIC OXIDE  . RIGHT HEART CATH N/A 11/27/2016   Procedure: Right Heart Cath;  Surgeon: Larey Dresser, MD;  Location: [place] [facility];  Service: Cardiovascular;  Laterality: N/A;  . RIGHT HEART CATH N/A 04/10/2017   Procedure: Right Heart Cath;  Surgeon: Larey Dresser, MD;  Location: [place] [facility];  Service: Cardiovascular;  Laterality: N/A;  . RIGHT HEART CATH N/A 04/06/2017   Procedure: Right Heart Cath;  Surgeon: Larey Dresser, MD;  Location: [place] [facility];  Service: Cardiovascular;  Laterality: N/A;  . SHOULDER OPEN ROTATOR CUFF REPAIR Right ~ 2000  . TEE WITHOUT CARDIOVERSION N/A 04/01/2017   Procedure: TRANSESOPHAGEAL ECHOCARDIOGRAM (TEE);  Surgeon: Gaye Pollack, MD;  Location: [place];  Service: Open Heart Surgery;  Laterality: N/A;   Family History  Problem Relation Age of Onset  . Hypertension Mother   . Diabetes type II Mother   . Hypertension Father   . Hypertension Sister   . Hypertension Paternal Grandmother   . Diabetes Other   . Hypertension Other    Social History:  reports that he has never smoked. He has quit using smokeless tobacco. His smokeless tobacco use included Chew. He reports that he drinks alcohol. He reports that he does not use drugs. Allergies: No Active Allergies Medications Prior to Admission  Medication Sig Dispense Refill  . acetaminophen (TYLENOL) 500 MG tablet Take 500-1,000 mg by mouth every 4 (four) hours as needed for headache (pain).    Marland Kitchen aspirin EC 81 MG tablet Take 81 mg by mouth daily.    Marland Kitchen atorvastatin (LIPITOR) 80 MG tablet TAKE ONE TABLET BY MOUTH DAILY 90 tablet 3  . carvedilol (COREG) 25 MG tablet Take 0.5 tablets (12.5 mg total) by mouth 2 (two) times daily with a meal. Please schedule appointment for refills. 180 tablet 3  . Colchicine (MITIGARE) 0.6 MG CAPS Take 1 capsule by mouth 2 (two) times daily.    . colchicine  0.6 MG tablet Take 1 tablet (0.6 mg total) by mouth daily. (Patient taking differently: Take 0.6 mg by mouth daily as needed (gout). ) 30 tablet 6  . digoxin (LANOXIN) 0.125 MG tablet Take 0.5 tablets (0.0625 mg total) by mouth daily. 45 tablet 3  . febuxostat (ULORIC) 40 MG tablet Take 120 mg by mouth daily.    Marland Kitchen glipiZIDE (GLUCOTROL) 5 MG tablet TAKE 1 TABLET (5 MG TOTAL) BY MOUTH 2 (TWO) TIMES DAILY BEFORE MEALS (REFER FUTURE REFILLS TO PRIMARY CARE PHYSICIAN) 180 tablet 0  . isosorbide-hydrALAZINE (BIDIL) 20-37.5 MG tablet Take 1.5 tablets by mouth 3 (three) times daily. 90 tablet 0  . nitroGLYCERIN (NITROSTAT) 0.4 MG SL tablet Place 1 tablet (0.4 mg total) under the tongue every 5 (five) minutes as needed for chest pain. 25 tablet 2  . torsemide (DEMADEX) 20 MG tablet Take 2 tablets (40 mg total) by mouth daily. 60 tablet 3    Home: Home Living Family/patient expects to be discharged to:: Private residence Living Arrangements: Spouse/significant other Available Help at Discharge: Family, Available PRN/intermittently Type of Home: House Home Access: Level entry Home Layout: Multi-level Alternate Level Stairs-Number of Steps: 4-6 Alternate Level Stairs-Rails: Right, Left Bathroom Shower/Tub: Multimedia programmer: Handicapped height Home Equipment: Bedside commode, Crutches Additional Comments: pt  reports deficits with peri care due to hand strength at times  Functional History: Prior Function Level of Independence: Independent Comments: was independent prior to admission, walking laps and going to the grocery store Functional Status:  Mobility: Bed Mobility Overal bed mobility: Needs Assistance Bed Mobility: Supine to Sit Supine to sit: +2 for physical assistance, Mod assist Sit to supine: Mod assist General bed mobility comments: Patient in chair as PT entered room Transfers Overall transfer level: Needs assistance Equipment used: Rolling walker (2  wheeled) Transfers: Sit to/from Stand Sit to Stand: Mod assist, +2 physical assistance Stand pivot transfers: Min assist, +2 safety/equipment General transfer comment: Reviewed sternal precautions.  Required mod assist to scoot to edge of chair and +2 mod assist to power up to standing with use of pillow on chest.  Assist to shift weight forward over LE's.  Assist to control descent into chair.  Patient stood x2.  Able to march in place 20 steps with each foot on each attempt. Ambulation/Gait Ambulation/Gait assistance: Min assist, +2 safety/equipment (3rd person for chair follow but did not need it. ) Ambulation Distance (Feet): 370 Feet Assistive device:  (EVA walker) Gait Pattern/deviations: Step-through pattern, Decreased stride length, Drifts right/left, Trunk flexed General Gait Details: NT - first day OOB after extubation on 04/25/17. Gait velocity interpretation: <1.8 ft/sec, indicative of risk for recurrent falls    ADL: ADL Overall ADL's : Needs assistance/impaired Eating/Feeding: Moderate assistance, Sitting, Bed level Eating/Feeding Details (indicate cue type and reason): due to gout and deformity  Grooming: Wash/dry hands, Wash/dry face, Oral care Upper Body Bathing: Total assistance, Bed level Lower Body Bathing: Total assistance, Bed level Upper Body Dressing : Total assistance, Bed level Lower Body Dressing: Total assistance, Bed level Toilet Transfer: Moderate assistance, +2 for safety/equipment, Ambulation, Comfort height toilet, RW Toilet Transfer Details (indicate cue type and reason): assist to power up to standing  Toileting- Clothing Manipulation and Hygiene: Total assistance, Bed level Functional mobility during ADLs: Minimal assistance, Moderate assistance, +2 for physical assistance, Rolling walker General ADL Comments: Pt limited this date by nausea and feeling poorly   Cognition: Cognition Overall Cognitive Status: Within Functional Limits for tasks  assessed Orientation Level: Oriented X4 Cognition Arousal/Alertness: Awake/alert Behavior During Therapy: Flat affect Overall Cognitive Status: Within Functional Limits for tasks assessed  Blood pressure (!) 84/65, pulse 88, temperature 98.3 F (36.8 C), temperature source Oral, resp. rate (!) 26, height 6' (1.829 m), weight 100 kg (220 lb 7.4 oz), SpO2 100 %. Physical Exam  Vitals reviewed. Constitutional: He is oriented to person, place, and time. He appears well-developed.  Cachectic  HENT:  Head: Normocephalic and atraumatic.  Eyes: EOM are normal. Right eye exhibits no discharge. Left eye exhibits no discharge.  Neck: Normal range of motion. Neck supple. No thyromegaly present.  Cardiovascular: Normal rate and regular rhythm.   Respiratory: Effort normal.  Decreased breath sounds but clear to auscultation  GI: Soft. Bowel sounds are normal. He exhibits no distension.  Musculoskeletal: He exhibits edema. He exhibits no tenderness.  Neurological: He is alert and oriented to person, place, and time.  Motor: 4/5 throughout Sensation intact to light touch  Skin: Skin is warm and dry.  Sternal incision c/d/i  Psychiatric: He has a normal mood and affect. His behavior is normal.    Results for orders placed or performed during the hospital encounter of 03/28/2017 (from the past 24 hour(s))  Cooxemetry Panel (carboxy, met, total hgb, O2 sat)     Status: Abnormal  Collection Time: 04/27/17 11:38 AM  Result Value Ref Range   Total hemoglobin 8.4 (L) 12.0 - 16.0 g/dL   O2 Saturation 65.1 %   Carboxyhemoglobin 2.0 (H) 0.5 - 1.5 %   Methemoglobin 1.1 0.0 - 1.5 %  Glucose, capillary     Status: Abnormal   Collection Time: 04/27/17 12:05 PM  Result Value Ref Range   Glucose-Capillary 207 (H) 65 - 99 mg/dL   Comment 1 Capillary Specimen   Heparin level (unfractionated)     Status: Abnormal   Collection Time: 04/27/17 12:45 PM  Result Value Ref Range   Heparin Unfractionated <0.10  (L) 0.30 - 0.70 IU/mL  Glucose, capillary     Status: Abnormal   Collection Time: 04/27/17  4:33 PM  Result Value Ref Range   Glucose-Capillary 206 (H) 65 - 99 mg/dL   Comment 1 Capillary Specimen   Glucose, capillary     Status: Abnormal   Collection Time: 04/27/17  8:14 PM  Result Value Ref Range   Glucose-Capillary 178 (H) 65 - 99 mg/dL   Comment 1 Arterial Specimen    Comment 2 Notify RN   Heparin level (unfractionated)     Status: Abnormal   Collection Time: 04/27/17  9:00 PM  Result Value Ref Range   Heparin Unfractionated <0.10 (L) 0.30 - 0.70 IU/mL  Glucose, capillary     Status: Abnormal   Collection Time: 04/28/17 12:00 AM  Result Value Ref Range   Glucose-Capillary 153 (H) 65 - 99 mg/dL   Comment 1 Arterial Specimen    Comment 2 Notify RN   Lactate dehydrogenase     Status: None   Collection Time: 04/28/17  3:34 AM  Result Value Ref Range   LDH 163 98 - 192 U/L  Protime-INR     Status: Abnormal   Collection Time: 04/28/17  3:34 AM  Result Value Ref Range   Prothrombin Time 21.2 (H) 11.4 - 15.2 seconds   INR 1.81   Comprehensive metabolic panel     Status: Abnormal   Collection Time: 04/28/17  3:34 AM  Result Value Ref Range   Sodium 130 (L) 135 - 145 mmol/L   Potassium 3.7 3.5 - 5.1 mmol/L   Chloride 101 101 - 111 mmol/L   CO2 20 (L) 22 - 32 mmol/L   Glucose, Bld 154 (H) 65 - 99 mg/dL   BUN 80 (H) 6 - 20 mg/dL   Creatinine, Ser 3.20 (H) 0.61 - 1.24 mg/dL   Calcium 7.7 (L) 8.9 - 10.3 mg/dL   Total Protein 4.9 (L) 6.5 - 8.1 g/dL   Albumin 2.2 (L) 3.5 - 5.0 g/dL   AST 16 15 - 41 U/L   ALT 8 (L) 17 - 63 U/L   Alkaline Phosphatase 104 38 - 126 U/L   Total Bilirubin 1.0 0.3 - 1.2 mg/dL   GFR calc non Af Amer 20 (L) >60 mL/min   GFR calc Af Amer 24 (L) >60 mL/min   Anion gap 9 5 - 15  Magnesium     Status: None   Collection Time: 04/28/17  3:34 AM  Result Value Ref Range   Magnesium 2.1 1.7 - 2.4 mg/dL  Phosphorus     Status: None   Collection Time:  04/28/17  3:34 AM  Result Value Ref Range   Phosphorus 3.0 2.5 - 4.6 mg/dL  CBC     Status: Abnormal   Collection Time: 04/28/17  3:34 AM  Result Value Ref Range   WBC  17.5 (H) 4.0 - 10.5 K/uL   RBC 2.69 (L) 4.22 - 5.81 MIL/uL   Hemoglobin 7.8 (L) 13.0 - 17.0 g/dL   HCT 23.6 (L) 39.0 - 52.0 %   MCV 87.7 78.0 - 100.0 fL   MCH 29.0 26.0 - 34.0 pg   MCHC 33.1 30.0 - 36.0 g/dL   RDW 18.9 (H) 11.5 - 15.5 %   Platelets 322 150 - 400 K/uL  Differential     Status: Abnormal   Collection Time: 04/28/17  3:34 AM  Result Value Ref Range   Neutrophils Relative % 84 %   Neutro Abs 14.7 (H) 1.7 - 7.7 K/uL   Lymphocytes Relative 8 %   Lymphs Abs 1.4 0.7 - 4.0 K/uL   Monocytes Relative 6 %   Monocytes Absolute 1.0 0.1 - 1.0 K/uL   Eosinophils Relative 2 %   Eosinophils Absolute 0.4 0.0 - 0.7 K/uL   Basophils Relative 0 %   Basophils Absolute 0.0 0.0 - 0.1 K/uL  Prealbumin     Status: Abnormal   Collection Time: 04/28/17  3:34 AM  Result Value Ref Range   Prealbumin <5 (L) 18 - 38 mg/dL  Triglycerides     Status: None   Collection Time: 04/28/17  3:37 AM  Result Value Ref Range   Triglycerides 38 <150 mg/dL  Heparin level (unfractionated)     Status: Abnormal   Collection Time: 04/28/17  3:37 AM  Result Value Ref Range   Heparin Unfractionated <0.10 (L) 0.30 - 0.70 IU/mL  Glucose, capillary     Status: Abnormal   Collection Time: 04/28/17  4:05 AM  Result Value Ref Range   Glucose-Capillary 138 (H) 65 - 99 mg/dL   Comment 1 Arterial Specimen    Comment 2 Notify RN   I-STAT 3, arterial blood gas (G3+)     Status: Abnormal   Collection Time: 04/28/17  4:07 AM  Result Value Ref Range   pH, Arterial 7.406 7.350 - 7.450   pCO2 arterial 32.1 32.0 - 48.0 mmHg   pO2, Arterial 64.0 (L) 83.0 - 108.0 mmHg   Bicarbonate 20.2 20.0 - 28.0 mmol/L   TCO2 21 0 - 100 mmol/L   O2 Saturation 92.0 %   Acid-base deficit 4.0 (H) 0.0 - 2.0 mmol/L   Patient temperature 98.7 F    Collection site  ARTERIAL LINE    Drawn by Nurse    Sample type ARTERIAL   Cooxemetry Panel (carboxy, met, total hgb, O2 sat)     Status: Abnormal   Collection Time: 04/28/17  4:25 AM  Result Value Ref Range   Total hemoglobin 13.5 12.0 - 16.0 g/dL   O2 Saturation 69.1 %   Carboxyhemoglobin 1.8 (H) 0.5 - 1.5 %   Methemoglobin 0.9 0.0 - 1.5 %  Glucose, capillary     Status: Abnormal   Collection Time: 04/28/17  8:25 AM  Result Value Ref Range   Glucose-Capillary 184 (H) 65 - 99 mg/dL   Comment 1 Capillary Specimen    Comment 2 Notify RN    Dg Chest Port 1 View  Result Date: 04/28/2017 CLINICAL DATA:  LVAD EXAM: PORTABLE CHEST 1 VIEW COMPARISON:  04/27/2017 FINDINGS: Left subclavian AICD, right upper extremity PICC, and LVAD device are stable. Heart remains mildly enlarged. Very low lung volumes with bibasilar atelectasis and vascular crowding are stable. Vascular congestion has improved. IMPRESSION: Improved vascular congestion. Continued low volumes and vascular crowding. Electronically Signed   By: Rodena Goldmann.D.  On: 04/28/2017 07:40   Dg Chest Port 1 View  Result Date: 04/27/2017 CLINICAL DATA:  Left ventricular assist device. EXAM: PORTABLE CHEST 1 VIEW COMPARISON:  04/26/2017 FINDINGS: 0523 hours. Low lung volumes. The cardio pericardial silhouette is enlarged. Right PICC line tip now projects in the lower right atrium near the junction with the IVC. Left single lead pacer/ AICD again noted. Left ventricular assist device remains in place. There is vascular congestion with probable interstitial edema and basilar atelectasis. Diffuse gaseous bowel distention noted within the visualized upper abdomen. IMPRESSION: 1. Interval removal of NG to with right PICC line now projecting in the inferior right atrium near the junction with the IVC. 2. Cardiomegaly with vascular congestion and interstitial edema. 3. Diffuse gaseous bowel distention within the visualized upper abdomen. Electronically Signed   By: Misty Stanley M.D.   On: 04/27/2017 07:14   Dg Abd Portable 1v  Result Date: 04/27/2017 CLINICAL DATA:  LVAD device.  Ileus. EXAM: PORTABLE ABDOMEN - 1 VIEW COMPARISON:  Common radiograph 04/26/2017. FINDINGS: Motion artifact limits evaluation. Re- demonstrated prominent colonic gas. Slight interval decrease in small bowel distention. Lumbar spine degenerative changes. IMPRESSION: Persistent prominent colonic gas pattern with mild interval decrease in small bowel distention. Electronically Signed   By: Lovey Newcomer M.D.   On: 04/27/2017 07:25    Assessment/Plan: Diagnosis: Debilitation Labs independently reviewed.  Records reviewed and summated above.  1. Does the need for close, 24 hr/day medical supervision in concert with the patient's rehab needs make it unreasonable for this patient to be served in a less intensive setting? Potentially  2. Co-Morbidities requiring supervision/potential complications: gout (monitor for flare), cominmed CHF (Monitor in accordance with increased physical activity and avoid UE resistance excercises), chronic mesenteric ischemia, CAD with CABG, ischemic cardiomyopathy with AICD (wean pressors as appropriate), renal insufficiency (avoid nephrotoxic meds), diabetes mellitus (Monitor in accordance with exercise and adjust meds as necessary), mediastinal hematoma, ileus with respiratory compromise, atrial fibrillation with RVR (monitor RR with increased physical activity, transition from heparin ggt when appropriate), tachypnea (monitor RR and O2 Sats with increased physical exertion) 3. Due to safety, disease management and patient education, does the patient require 24 hr/day rehab nursing? Potentially 4. Does the patient require coordinated care of a physician, rehab nurse, PT (1-2 hrs/day, 5 days/week) and OT (1-2 hrs/day, 5 days/week) to address physical and functional deficits in the context of the above medical diagnosis(es)? Potentially Addressing deficits in the  following areas: balance, endurance, locomotion, strength, transferring, bathing, dressing and psychosocial support 5. Can the patient actively participate in an intensive therapy program of at least 3 hrs of therapy per day at least 5 days per week? Potentially 6. The potential for patient to make measurable gains while on inpatient rehab is good 7. Anticipated functional outcomes upon discharge from inpatient rehab are modified independent and supervision  with PT, modified independent and supervision with OT, n/a with SLP. 8. Estimated rehab length of stay to reach the above functional goals is: 5-7 days. 9. Anticipated D/C setting: Home 10. Anticipated post D/C treatments: HH therapy and Home excercise program 11. Overall Rehab/Functional Prognosis: good  RECOMMENDATIONS: This patient's condition is appropriate for continued rehabilitative care in the following setting: Unclear if patient will be able to tolerate 3 hours therapy/day.  Further, he is making significant progress in therapies. Patient has agreed to participate in recommended program. Potentially Note that insurance prior authorization may be required for reimbursement for recommended care.  Comment: Rehab Admissions  Coordinator to follow up.  Delice Lesch, MD, Mellody Drown Cathlyn Parsons., PA-C 04/28/2017

## 2017-04-28 NOTE — Progress Notes (Signed)
PHARMACY - ADULT TOTAL PARENTERAL NUTRITION CONSULT NOTE   Pharmacy Consult:  TPN Indication: Ileus and poor baseline nutritional status  Patient Measurements: Height: 6' (182.9 cm) Weight: 220 lb 7.4 oz (100 kg) IBW/kg (Calculated) : 77.6 TPN AdjBW (KG): 96.9 Body mass index is 29.9 kg/m.    Assessment:  [age] YOM with severe malnutrition and severely depleted muscle mass related to chornic CHF post HVAD placement on 04/01/2017.  Patient was extubated in 04/18/17 and started on a soft diet on 04/20/17.  He decompensated overnight on 04/23/17 requiring re-intubation and found to have an ileus after vomiting fecal material.  NGT was placed and 3000 mL suctioned. Pharmacy consulted to manage TPN.   GI: prealbumin low at < 5.  NGT removed 7/7, BM x2 Endo: gout on Uloric.  DM on glipizide PTA - CBGs mostly controlled, off Levemir 7/4 Insulin requirements in the past 24 hours: 30 units SSI + 20 units in TPN Lytes: low Na+ prior to TPN - Na 130, K 3.7 (goal >/= 4 for ileus), CO2 slightly low, iCa 1.08 on 7/5, CoCa WNL, others WNL *no lytes in TPN: 7/6, 7/7 Renal: AoC CKD - SCr 3.2, BUN up 80, marginal UOP 0.4 m/kg/hr, net +9.6L since admit Pulm: extubated 7/6 to RA Cards: CHF (EF 15%) with LVAD, CAD, HTN, Afib - hypotensive, HR controlled, CVP 3-8 - amio drip, ASA, Lasix 80mg  x 1 this AM, off milrinone Hepatobil: LFTs / tbili / TG WNL Neuro: A&O Heme/AC: Heparin/Couadin for Afib, HL < 0.1, INR 1.81, anemic, plts WNL ID: Vanc/Zosyn D#5 for Citrobacter/Klebsiella PNA - afebrile, WBC 17.5 Best Practices: Heparin gtt, Coumadin, MC TPN Access: PICC 04/21/17 TPN start date: 04/24/17  Nutritional Goals (per RD recommendation on 7/5): 2150-2250 kCal and 120-140 gm protein per day  Current Nutrition:  Clear liquid diet (consuming up to 50% of meals) TPN   Plan:  - Increase Clinimix E 5/15 to 105 ml/hr.  Clinimix will provide 1789 kCal and 126 gm of protein per day, meeting 83% of kCal and 100gm of  protein needs. - Hold 20% ILE for first 7 days of TPN for ICU patients per ASPEN/SCCM guidelines (start date 7/12) - Daily multivitamin in TPN - Trace elements in TPN every other day, next 7/9 - Continue TCTS SSI Q4H + increase insulin in TPN to 35 units (proportional increase from increased TPN rate) - Pepcid 20mg  in TPN daily - KCL x 2 runs - Watch BUN closely - F/U with diet advancement to wean off of TPN, AM labs   James [name], PharmD, BCPS Pager:  [phone] 04/28/2017, 12:47 PM

## 2017-04-28 NOTE — Progress Notes (Signed)
ANTICOAGULATION CONSULT NOTE   Pharmacy Consult for heparin Indication: heart pump  No Active Allergies  Patient Measurements: Height: 6' (182.9 cm) Weight: 220 lb 7.4 oz (100 kg) IBW/kg (Calculated) : 77.6   Vital Signs: Temp: 98.1 F (36.7 C) (07/09 1410) Temp Source: Oral (07/09 1410) BP: 88/75 (07/09 1410) Pulse Rate: 85 (07/09 1410)  Labs:  Recent Labs  04/26/17 0357 04/26/17 1739 04/27/17 0421  04/27/17 2100 04/28/17 0334 04/28/17 0337 04/28/17 1310  HGB 8.8*  --  8.6*  --   --  7.8*  --   --   HCT 27.2*  --  26.6*  --   --  23.6*  --   --   PLT 355  --  327  --   --  322  --   --   LABPROT 24.6*  --  21.1*  --   --  21.2*  --   --   INR 2.17  --  1.80  --   --  1.81  --   --   HEPARINUNFRC  --   --   --   < > <0.10*  --  <0.10* 0.23*  CREATININE 3.26* 3.33* 3.20*  --   --  3.20*  --   --   < > = values in this interval not displayed.  Estimated Creatinine Clearance: 32.3 mL/min (A) (by C-G formula based on SCr of 3.2 mg/dL (H)).   Medical History: Past Medical History:  Diagnosis Date  . AICD (automatic cardioverter/defibrillator) present 09/2015   [facility] [employer] [place] [place] VR model [id number] (serial Number [id number]) ICD   . CHF (congestive heart failure) (HCC)   . Chronic edema   . CMI (chronic mesenteric ischemia) (HCC)   . Coronary artery disease   . Dyspnea   . Gout   . Gout   . Hypertension   . Ischemic cardiomyopathy    EF 25%  . Myocardial infarction [facility](HCC)    "they saw I'd had one; not sure when but it was before 2012"  . Obesity   . Renal insufficiency   . Type II diabetes mellitus [facility](HCC)     Assessment: 56 year old male s/p heartware VAD on 6/28, course complicated by post-op ileus. INR was therapeutic then warfarin was held due to ileus and INR now trending down and is currently below goal. -heparin level remains below goal but up from previous  Goal of Therapy:  INR goal 2-3 Heparin level 0.3-0.7 units/ml Monitor platelets by  anticoagulation protocol: Yes   Plan:  Increase heparin gtt to 2100 units/hr Heparin level in 8 hrs  Daily heparin level and CBC   Harland GermanAndrew Mariesa Grieder, Pharm D 04/28/2017 2:26 PM

## 2017-04-28 NOTE — Progress Notes (Signed)
Nutrition Follow-up  DOCUMENTATION CODES:    Severe malnutrition in context of chronic illness  INTERVENTION:    TPN per pharmacy  NUTRITION DIAGNOSIS:   Malnutrition (severe) related to chronic illness (CHF) as evidenced by severe depletion of body fat, severe depletion of muscle mass  Ongoing  GOAL:   Patient will meet greater than or equal to 90% of their needs  Progressing  MONITOR:   PO intake, Supplement acceptance, Labs, Weight trends, Skin, I & O's  ASSESSMENT:   56 yo male with hx of HTN, gout, obesity, chronic edema, CAD, ischemic cardiomyopathy, DM2, AICD, MI, CHF, renal insufficiency who was admitted on 6/22 with acute on chronic systolic heart failure.  6/28  HVAD placement 6/29  Extubated 7/01  Soft diet started, Ensure Enlive added 7/04  Re-intubated 7/06  Extubated  Pt found to have ileus after vomiting fecal material.  TPN started 7/5. NGT removed 7/7. Advanced to Full Liquids today. Labs and medications reviewed.  CBG's [phone].  Patient is receiving TPN with Clinimix E 5/15 @ 105 ml/hr. Lipids on hold. Provides 1789 kcal and 126 grams protein per day. Meets 85% minimum estimated energy needs and 100% minimum estimated protein needs.  Diet Order:  .TPN (CLINIMIX-E) Adult Diet full liquid Room service appropriate? Yes; Fluid consistency: Thin .TPN (CLINIMIX-E) Adult  Skin:  Wound (see comment) (open wound R leg; boil R buttocks)  Last BM:  7/8  Height:   Ht Readings from Last 1 Encounters:  04/22/17 6' (1.829 m)    Weight:   Wt Readings from Last 1 Encounters:  04/28/17 220 lb 7.4 oz (100 kg)   Admission weight 185 lbs 12.8 oz (84.3 kg)  Ideal Body Weight:  80.9 kg  BMI:  Body mass index is 29.9 kg/m.  Estimated Nutritional Needs:   Kcal:  2100-2300  Protein:  120-140 gm  Fluid:  2.1-2.3 L  EDUCATION NEEDS:   No education needs identified at this time  Maureen ChattersKatie Breann Losano, RD, LDN Pager #: [phone] After-Hours Pager  #: [phone]

## 2017-04-28 NOTE — Progress Notes (Signed)
ANTICOAGULATION CONSULT NOTE   Pharmacy Consult for heparin Indication: heart pump  No Active Allergies  Patient Measurements: Height: 6' (182.9 cm) Weight: 214 lb 11.7 oz (97.4 kg) IBW/kg (Calculated) : 77.6   Vital Signs: Temp: 98.2 F (36.8 C) (07/08 2000) Temp Source: Oral (07/08 2000) BP: 86/75 (07/09 0400) Pulse Rate: 86 (07/09 0500)  Labs:  Recent Labs  04/26/17 0357 04/26/17 1739 04/27/17 0421 04/27/17 1245 04/27/17 2100 04/28/17 0334 04/28/17 0337  HGB 8.8*  --  8.6*  --   --  7.8*  --   HCT 27.2*  --  26.6*  --   --  23.6*  --   PLT 355  --  327  --   --  322  --   LABPROT 24.6*  --  21.1*  --   --  21.2*  --   INR 2.17  --  1.80  --   --  1.81  --   HEPARINUNFRC  --   --   --  <0.10* <0.10*  --  <0.10*  CREATININE 3.26* 3.33* 3.20*  --   --   --   --     Estimated Creatinine Clearance: 31.9 mL/min (A) (by C-G formula based on SCr of 3.2 mg/dL (H)).   Medical History: Past Medical History:  Diagnosis Date  . AICD (automatic cardioverter/defibrillator) present 09/2015   [facility] [employer] [place] VR model [id number] (serial Number [id number]) ICD   . CHF (congestive heart failure) (HCC)   . Chronic edema   . CMI (chronic mesenteric ischemia) (HCC)   . Coronary artery disease   . Dyspnea   . Gout   . Gout   . Hypertension   . Ischemic cardiomyopathy    EF 25%  . Myocardial infarction [facility](HCC)    "they saw I'd had one; not sure when but it was before 2012"  . Obesity   . Renal insufficiency   . Type II diabetes mellitus [facility](HCC)     Assessment: 56 year old male s/p heartware VAD on 6/28, course complicated by post-op ileus. INR was therapeutic then warfarin was held due to ileus and INR now trending down and is currently below goal. Two days of therapy missed, dosing resumed by surgery yesterday. Starting heparin bridge per pharmacy.   Heparin level remains undetectable, again with no interruptions in heparin gtt per RN.Hgb with small drop from 8.6 to  7.8 - no bleeding noted per RN.   Goal of Therapy:  INR goal 2-3 Heparin level 0.3-0.7 units/ml Monitor platelets by anticoagulation protocol: Yes   Plan:  Increase heparin gtt to 1900 units/hr Heparin level in 6 hrs  Daily heparin level and CBC  Monitor for s/s bleeding  F/u with cardiology   York CeriseKatherine Cook, PharmD Clinical Pharmacist 04/28/17 5:20 AM

## 2017-04-28 NOTE — Progress Notes (Addendum)
   04/28/17 1129  Clinical Encounter Type  Visited With Patient  Visit Type Initial;Psychological support;Spiritual support;Social support  Referral From Family  Consult/Referral To Chaplain  Stress Factors  Patient Stress Factors Exhausted;Health changes;Major life changes  Family Stress Factors Health changes  Chaplain visited with patient and provided presence and prayer.  Chaplain saw spouse in the hall on Friday and shared that she would be checking in on patient.  Patient asked Chaplain to provide a presence for him since he is unable to go to church right now.  Chaplain happy to provide care for patient and patient's family.  Please call as needed.

## 2017-04-29 ENCOUNTER — Inpatient Hospital Stay (HOSPITAL_COMMUNITY): Payer: Medicare PPO

## 2017-04-29 LAB — GLUCOSE, CAPILLARY
GLUCOSE-CAPILLARY: 171 mg/dL — AB (ref 65–99)
GLUCOSE-CAPILLARY: 146 mg/dL — AB (ref 65–99)
GLUCOSE-CAPILLARY: 166 mg/dL — AB (ref 65–99)
Glucose-Capillary: 148 mg/dL — ABNORMAL HIGH (ref 65–99)
Glucose-Capillary: 180 mg/dL — ABNORMAL HIGH (ref 65–99)
Glucose-Capillary: 178 mg/dL — ABNORMAL HIGH (ref 65–99)
Glucose-Capillary: 150 mg/dL — ABNORMAL HIGH (ref 65–99)
Glucose-Capillary: 170 mg/dL — ABNORMAL HIGH (ref 65–99)
Glucose-Capillary: 123 mg/dL — ABNORMAL HIGH (ref 65–99)

## 2017-04-29 LAB — CBC
HCT: 26.1 % — ABNORMAL LOW (ref 39.0–52.0)
Hemoglobin: 8.1 g/dL — ABNORMAL LOW (ref 13.0–17.0)
MCH: 29.8 pg (ref 26.0–34.0)
MCHC: 31 g/dL (ref 30.0–36.0)
MCV: 96 fL (ref 78.0–100.0)
PLATELETS: 295 10*3/uL (ref 150–400)
RBC: 2.72 MIL/uL — ABNORMAL LOW (ref 4.22–5.81)
RDW: 20.5 % — AB (ref 11.5–15.5)
WBC: 16.4 10*3/uL — ABNORMAL HIGH (ref 4.0–10.5)

## 2017-04-29 LAB — CULTURE, BLOOD (ROUTINE X 2)
Culture: NO GROWTH
Culture: NO GROWTH
Special Requests: ADEQUATE
Special Requests: ADEQUATE

## 2017-04-29 LAB — COOXEMETRY PANEL
Carboxyhemoglobin: 1.8 % — ABNORMAL HIGH (ref 0.5–1.5)
Methemoglobin: 1.3 % (ref 0.0–1.5)
O2 SAT: 76.4 %
TOTAL HEMOGLOBIN: 13.3 g/dL (ref 12.0–16.0)

## 2017-04-29 LAB — BASIC METABOLIC PANEL
Anion gap: 9 (ref 5–15)
BUN: 86 mg/dL — AB (ref 6–20)
CO2: 21 mmol/L — AB (ref 22–32)
Calcium: 7.7 mg/dL — ABNORMAL LOW (ref 8.9–10.3)
Chloride: 98 mmol/L — ABNORMAL LOW (ref 101–111)
Creatinine, Ser: 3.16 mg/dL — ABNORMAL HIGH (ref 0.61–1.24)
GFR calc Af Amer: 24 mL/min — ABNORMAL LOW (ref >60)
GFR, EST NON AFRICAN AMERICAN: 21 mL/min — AB (ref >60)
GLUCOSE: 165 mg/dL — AB (ref 65–99)
POTASSIUM: 3.9 mmol/L (ref 3.5–5.1)
Sodium: 128 mmol/L — ABNORMAL LOW (ref 135–145)

## 2017-04-29 LAB — HEPARIN LEVEL (UNFRACTIONATED): HEPARIN UNFRACTIONATED: 0.2 [IU]/mL — AB (ref 0.30–0.70)

## 2017-04-29 LAB — PROTIME-INR
INR: 2.19
Prothrombin Time: 24.7 seconds — ABNORMAL HIGH (ref 11.4–15.2)

## 2017-04-29 LAB — LACTATE DEHYDROGENASE: LDH: 207 U/L — ABNORMAL HIGH (ref 98–192)

## 2017-04-29 MED ORDER — M.V.I. ADULT IV INJ
INTRAVENOUS | Status: AC
  Administered 2017-04-29: 18:00:00 via INTRAVENOUS
  Filled 2017-04-29: qty 960

## 2017-04-29 MED ORDER — WARFARIN - PHARMACIST DOSING INPATIENT
Freq: Every day | Status: DC
  Administered 2017-05-01: 18:00:00

## 2017-04-29 MED ORDER — POTASSIUM CHLORIDE CRYS ER 20 MEQ PO TBCR
20.0000 meq | EXTENDED_RELEASE_TABLET | Freq: Once | ORAL | Status: AC
  Administered 2017-04-29: 20 meq via ORAL
  Filled 2017-04-29: qty 1

## 2017-04-29 MED ORDER — AMIODARONE HCL 200 MG PO TABS
200.0000 mg | ORAL_TABLET | Freq: Two times a day (BID) | ORAL | Status: DC
  Administered 2017-04-29 – 2017-05-02 (×7): 200 mg via ORAL
  Filled 2017-04-29 (×7): qty 1

## 2017-04-29 MED ORDER — WARFARIN SODIUM 7.5 MG PO TABS
7.5000 mg | ORAL_TABLET | Freq: Once | ORAL | Status: AC
  Administered 2017-04-29: 7.5 mg via ORAL
  Filled 2017-04-29: qty 1

## 2017-04-29 MED ORDER — HYDRALAZINE HCL 10 MG PO TABS
10.0000 mg | ORAL_TABLET | Freq: Three times a day (TID) | ORAL | Status: DC
  Administered 2017-04-29 – 2017-05-01 (×7): 10 mg via ORAL
  Filled 2017-04-29 (×7): qty 1

## 2017-04-29 MED ORDER — FAMOTIDINE 20 MG PO TABS
20.0000 mg | ORAL_TABLET | Freq: Every day | ORAL | Status: DC
  Administered 2017-04-29 – 2017-05-01 (×3): 20 mg via ORAL
  Filled 2017-04-29 (×3): qty 1

## 2017-04-29 MED ORDER — ISOSORBIDE MONONITRATE ER 30 MG PO TB24
15.0000 mg | ORAL_TABLET | Freq: Every day | ORAL | Status: DC
  Administered 2017-04-29 – 2017-04-30 (×2): 15 mg via ORAL
  Filled 2017-04-29 (×2): qty 1

## 2017-04-29 MED ORDER — TORSEMIDE 20 MG PO TABS
40.0000 mg | ORAL_TABLET | Freq: Every day | ORAL | Status: DC
  Administered 2017-04-29 – 2017-05-02 (×4): 40 mg via ORAL
  Filled 2017-04-29 (×4): qty 2

## 2017-04-29 MED ORDER — MILRINONE LACTATE IN DEXTROSE 20-5 MG/100ML-% IV SOLN
0.1250 ug/kg/min | INTRAVENOUS | Status: DC
  Administered 2017-04-29 (×2): 0.125 ug/kg/min via INTRAVENOUS
  Filled 2017-04-29: qty 100

## 2017-04-29 NOTE — Progress Notes (Signed)
12 Days Post-Op Procedure(s) (LRB): MEDIASTINAL EXPLORATION (N/A) Subjective: Continues to improve after ileus with aspiration and intubation Acute on chronic renal failure continues to be the main issue with creatinine plateaued at 3.2. GI function is back to normal and his appetite is improved. Would recommend weaning off TPN as this will present significant challenge with managing renal failure. INR now therapeutic and patient off heparin. Surgical incisions look good. VAD pulsatility is satisfactory. Chest x-ray shows improved lung volumes without infiltrate  Objective: Vital signs in last 24 hours: Temp:  [97.5 F (36.4 C)-98.7 F (37.1 C)] 97.5 F (36.4 C) (07/10 1618) Pulse Rate:  [29-100] 91 (07/10 1800) Cardiac Rhythm: Normal sinus rhythm (07/10 0800) Resp:  [20-35] 29 (07/10 1800) BP: (88-113)/(70-88) 96/81 (07/10 1700) SpO2:  [95 %-100 %] 96 % (07/10 1800) Weight:  [227 lb 4.7 oz (103.1 kg)] 227 lb 4.7 oz (103.1 kg) (07/10 0359)  Hemodynamic parameters for last 24 hours: CVP:  [8 mmHg] 8 mmHg  Intake/Output from previous day: 07/09 0701 - 07/10 0700 In: 4406.2 [P.O.:360; I.V.:3461.2; Blood:335; IV Piggyback:250] Out: 1390 [Urine:1390] Intake/Output this shift: Total I/O In: 1650.7 [P.O.:240; I.V.:1410.7] Out: 651 [Urine:650; Stool:1]  Abdomen soft Extremities warm Lungs clear  Lab Results:  Recent Labs  04/28/17 0334 04/29/17 0320  WBC 17.5* 16.4*  HGB 7.8* 8.1*  HCT 23.6* 26.1*  PLT 322 295   BMET:  Recent Labs  04/28/17 0334 04/29/17 0600  NA 130* 128*  K 3.7 3.9  CL 101 98*  CO2 20* 21*  GLUCOSE 154* 165*  BUN 80* 86*  CREATININE 3.20* 3.16*  CALCIUM 7.7* 7.7*    PT/INR:  Recent Labs  04/29/17 0320  LABPROT 24.7*  INR 2.19   ABG    Component Value Date/Time   PHART 7.406 04/28/2017 0407   HCO3 20.2 04/28/2017 0407   TCO2 21 04/28/2017 0407   ACIDBASEDEF 4.0 (H) 04/28/2017 0407   O2SAT 76.4 04/29/2017 0415   CBG (last 3)    Recent Labs  04/29/17 0818 04/29/17 1211 04/29/17 1621  GLUCAP 178* 150* 170*    Assessment/Plan: S/P Procedure(s) (LRB): MEDIASTINAL EXPLORATION (N/A) Continue milrinone to optimize cardiac output Hold Lasix because of rising creatinine and low CVP Transition off TPN to oral diet Coumadin per pharmacy   LOS: 18 days    Kathlee Nationseter Van Trigt III 04/29/2017

## 2017-04-29 NOTE — Progress Notes (Signed)
Physical Therapy Treatment Patient Details Name: James [name] MRN: [id number] DOB: 1961-05-17 Today's Date: 04/29/2017    History of Present Illness  Pt adm with acute on chronic heart failure and underwent Heartware HVAD implant on 6/28. Returned to OR later that day for evacuation of mediastinal hematoma. On 7/4 developed ileus with likely aspiration and intubated 7/4-7/6. PMH - gout, DMII, HTN, CKD, CAD S/P CABGx6 2012, chronic systolic heart failure and [employer] ICD. presents now s/p heartware HVAD implant    PT Comments    Patient required +2 assist for bed mobility. This session limited by pt's nose bleeding when in sitting EOB. RN aware and in to assess pt end of session. PT will continue to follow acutely and progress as tolerated.    Follow Up Recommendations  CIR;Supervision for mobility/OOB     Equipment Recommendations  Other (comment) (TBD)    Recommendations for Other Services Rehab consult     Precautions / Restrictions Precautions Precautions: Sternal Precaution Comments: Heartware HVAD, external pacer wires, A-line Restrictions Weight Bearing Restrictions: Yes Other Position/Activity Restrictions: Sternal precautions    Mobility  Bed Mobility Overal bed mobility: Needs Assistance Bed Mobility: Supine to Sit;Sit to Supine;Rolling Rolling: Mod assist;+2 for physical assistance   Supine to sit: +2 for physical assistance;Mod assist Sit to supine: +2 for physical assistance;Max assist   General bed mobility comments: pt rolled toward L side X2 using bed rail with assist at LE and trunk to get into sidelying and to maintain sidelying for pericare; assist at bilat LE and trunk into sitting and back to supine; cues for sequencing and technique; pt using heart pillow  Transfers                 General transfer comment: did not transfer due to nose bleed upon sitting EOB  Ambulation/Gait                 Stairs            Wheelchair  Mobility    Modified Rankin (Stroke Patients Only)       Balance Overall balance assessment: Needs assistance Sitting-balance support: No upper extremity supported;Feet supported Sitting balance-Leahy Scale: Fair                                      Cognition Arousal/Alertness: Awake/alert Behavior During Therapy: Flat affect Overall Cognitive Status: Within Functional Limits for tasks assessed                                        Exercises      General Comments General comments (skin integrity, edema, etc.): nose bleed in sitting--RN in to assess end of session      Pertinent Vitals/Pain Pain Assessment: Faces Faces Pain Scale: Hurts little more Pain Location: sternum with transitional movements Pain Descriptors / Indicators: Discomfort;Sore Pain Intervention(s): Monitored during session    Home Living                      Prior Function            PT Goals (current goals can now be found in the care plan section) Progress towards PT goals: Progressing toward goals    Frequency    Min 3X/week      PT  Plan Current plan remains appropriate    Co-evaluation              AM-PAC PT "6 Clicks" Daily Activity  Outcome Measure  Difficulty turning over in bed (including adjusting bedclothes, sheets and blankets)?: Total Difficulty moving from lying on back to sitting on the side of the bed? : Total Difficulty sitting down on and standing up from a chair with arms (e.g., wheelchair, bedside commode, etc,.)?: Total Help needed moving to and from a bed to chair (including a wheelchair)?: A Lot Help needed walking in hospital room?: A Little Help needed climbing 3-5 steps with a railing? : A Lot 6 Click Score: 10    End of Session   Activity Tolerance: Treatment limited secondary to medical complications (Comment) (nose bleed) Patient left: with call bell/phone within reach;in bed;with nursing/sitter in  room Nurse Communication: Mobility status PT Visit Diagnosis: Unsteadiness on feet (R26.81);Muscle weakness (generalized) (M62.81);Difficulty in walking, not elsewhere classified (R26.2)     Time: [id number] PT Time Calculation (min) (ACUTE ONLY): 36 min  Charges:  $Therapeutic Activity: 23-37 mins                    G Codes:       Erline LevineKellyn Dorinda Stehr, PTA Pager: [phone]     Carolynne EdouardKellyn R Tiffanni Scarfo 04/29/2017, 1:32 PM

## 2017-04-29 NOTE — Progress Notes (Signed)
ANTICOAGULATION CONSULT NOTE   Pharmacy Consult for heparin and warfarin Indication: heart pump  No Active Allergies  Patient Measurements: Height: 6' (182.9 cm) Weight: 227 lb 4.7 oz (103.1 kg) IBW/kg (Calculated) : 77.6   Vital Signs: Temp: 98.7 F (37.1 C) (07/10 0400) Temp Source: Oral (07/10 0400) BP: 104/82 (07/10 0600) Pulse Rate: 86 (07/10 0600)  Labs:  Recent Labs  04/27/17 0421  04/28/17 0334 04/28/17 0337 04/28/17 1310 04/28/17 2328 04/29/17 0320 04/29/17 0600  HGB 8.6*  --  7.8*  --   --   --  8.1*  --   HCT 26.6*  --  23.6*  --   --   --  26.1*  --   PLT 327  --  322  --   --   --  295  --   LABPROT 21.1*  --  21.2*  --   --   --  24.7*  --   INR 1.80  --  1.81  --   --   --  2.19  --   HEPARINUNFRC  --   < >  --  <0.10* 0.23* 0.20*  --   --   CREATININE 3.20*  --  3.20*  --   --   --   --  3.16*  < > = values in this interval not displayed.  Estimated Creatinine Clearance: 33.2 mL/min (A) (by C-G formula based on SCr of 3.16 mg/dL (H)).   Assessment: 56 year old male s/p heartware VAD on 6/28, course complicated by post-op ileus. INR was therapeutic then warfarin was held due to ileus.  INR now therapeutic this am at 2.1. D/w CVTS ok to stop heparin at this time. Warfarin dosing to be done by pharmacy now, will repeat 7.5mg  dose tonight. Will continue to follow INR closely as patient is very deconditioned and diet is slowly improving.  Goal of Therapy:  INR goal 2-3 Heparin level 0.3-0.7 units/ml Monitor platelets by anticoagulation protocol: Yes   Plan:  D/c heparin Repeat warfarin 7.5mg  tonight Daily INR for now  Sheppard CoilFrank Wilson PharmD., BCPS Clinical Pharmacist Pager [phone] 04/29/2017 12:21 PM

## 2017-04-29 NOTE — Progress Notes (Signed)
PHARMACY - ADULT TOTAL PARENTERAL NUTRITION CONSULT NOTE   Pharmacy Consult:  TPN Indication: Ileus and poor baseline nutritional status  Patient Measurements: Height: 6' (182.9 cm) Weight: 227 lb 4.7 oz (103.1 kg) IBW/kg (Calculated) : 77.6 TPN AdjBW (KG): 96.9 Body mass index is 30.83 kg/m.    Assessment:  [age] YOM with severe malnutrition and severely depleted muscle mass related to chornic CHF post HVAD placement on 03/22/2017.  Patient was extubated in 04/18/17 and started on a soft diet on 04/20/17.  He decompensated overnight on 04/23/17 requiring re-intubation and found to have an ileus after vomiting fecal material.  NGT was placed and 3000 mL suctioned. Pharmacy consulted to manage TPN.   GI: ileus.  Prealbumin low at < 5.  NGT removed 7/7, BM x3 Endo: gout on Uloric.  DM on glipizide PTA - CBGs acceptable, off Levemir 7/4 Insulin requirements in the past 24 hours: 22 units SSI + 35 units in TPN Lytes: low Na+ prior to TPN - low Na/CL/CO2, K 3.9 (goal >/= 4 for ileus), iCa 1.08 on 7/5, CoCa WNL *no lytes in TPN: 7/6, 7/7 Renal: AoC CKD - SCr 3.16, BUN up 86 (d/t renal fxn and Lasix more so than TPN), UOP 0.6 m/kg/hr with Lasix, net +16.1L since admit Pulm: extubated 7/6 to RA Cards: CHF (EF 15%) with LVAD, CAD, HTN, Afib - BP soft, HR controlled, CVP 8 - amio gtt >> PO, ASA, Lasix 80mg  IV x 2 yesterday, torsemide added 7/10, hydralazine, Imdur, off milrinone Hepatobil: LFTs / tbili / TG WNL Neuro: A&O Heme/AC: Couadin for Afib, INR therapeutic, anemic, plts WNL ID: Vanc/Zosyn D#6 for Citrobacter/Klebsiella PNA - afebrile, WBC down 16.4 Best Practices: Heparin gtt, Coumadin, MC TPN Access: PICC 04/21/17 TPN start date: 04/24/17  Nutritional Goals (per RD recommendation on 7/9): 2100-2300 kCal and 120-140 gm protein per day  Current Nutrition:  Soft diet  TPN   Plan:  - Per discussion with HF team: reduce Clinimix E 5/15 to 40 ml/hr with plan to d/c TPN in AM. - Hold 20% ILE for  first 7 days of TPN for ICU patients per ASPEN/SCCM guidelines (start date 7/12) - Daily multivitamin in TPN - Trace elements in TPN every other day, next 7/11 - Continue TCTS SSI Q4H + reduce insulin in TPN to 16 units - Remove Pepcid from TPN:  20mg  PO QHS - KCL 20mEq PO x 1 - F/U AM labs, abx stop date   Noraa Pickeral D. Laney Potashang, PharmD, BCPS Pager:  [phone] 04/29/2017, 9:41 AM

## 2017-04-29 NOTE — Progress Notes (Signed)
ANTICOAGULATION CONSULT NOTE   Pharmacy Consult for heparin Indication: LVAD  No Active Allergies  Patient Measurements: Height: 6' (182.9 cm) Weight: 220 lb 7.4 oz (100 kg) IBW/kg (Calculated) : 77.6   Vital Signs: Temp: 98.5 F (36.9 C) (07/09 1900) Temp Source: Oral (07/09 1900) BP: 99/79 (07/10 0000) Pulse Rate: 85 (07/10 0000)  Labs:  Recent Labs  04/26/17 0357 04/26/17 1739 04/27/17 0421  04/28/17 0334 04/28/17 0337 04/28/17 1310 04/28/17 2328  HGB 8.8*  --  8.6*  --  7.8*  --   --   --   HCT 27.2*  --  26.6*  --  23.6*  --   --   --   PLT 355  --  327  --  322  --   --   --   LABPROT 24.6*  --  21.1*  --  21.2*  --   --   --   INR 2.17  --  1.80  --  1.81  --   --   --   HEPARINUNFRC  --   --   --   < >  --  <0.10* 0.23* 0.20*  CREATININE 3.26* 3.33* 3.20*  --  3.20*  --   --   --   < > = values in this interval not displayed.  Estimated Creatinine Clearance: 32.3 mL/min (A) (by C-G formula based on SCr of 3.2 mg/dL (H)).  Assessment: 56 year old male s/p LVAD on 6/28, subtheraeputic INR, for heparin  Goal of Therapy:  INR goal 2-3 Heparin level 0.3-0.7 units/ml Monitor platelets by anticoagulation protocol: Yes   Plan:  Increase Heparin 2300 units/hr Follow-up am labs.   Geannie RisenGreg Maribel Hadley, PharmD, BCPS  04/29/2017 12:34 AM

## 2017-04-29 NOTE — Progress Notes (Signed)
Chaplain stopped by for a follow-up visit with the patient.  Patient sitting up sort of and talks about his nose bleeding when he was walking this morning.  Patient and Chaplain talked about taking things one day at time.  Chaplain will remain available as needed for this patient.  Please page [phone]

## 2017-04-29 NOTE — Progress Notes (Signed)
Occupational Therapy Treatment Patient Details Name: James [name] MRN: [id number] DOB: 1960-11-17 Today's Date: 04/29/2017    History of present illness  Pt adm with acute on chronic heart failure and underwent Heartware HVAD implant on 6/28. Returned to OR later that day for evacuation of mediastinal hematoma. On 7/4 developed ileus with likely aspiration and intubated 7/4-7/6. PMH - gout, DMII, HTN, CKD, CAD S/P CABGx6 2012, chronic systolic heart failure and St jude ICD. presents now s/p heartware HVAD implant   OT comments  Pt requires mod A +2 for BSC transfer and ambulated 17590ft with min A +2 and seven standing rest breaks with DOE 3/4.  HR to 136.  Recommend CIR.   Follow Up Recommendations  CIR;Supervision/Assistance - 24 hour    Equipment Recommendations  3 in 1 bedside commode    Recommendations for Other Services Rehab consult    Precautions / Restrictions Precautions Precautions: Sternal Precaution Comments: Heartware HVAD, external pacer wires, A-line Restrictions Weight Bearing Restrictions: Yes Other Position/Activity Restrictions: Sternal precautions       Mobility Bed Mobility Overal bed mobility: Needs Assistance Bed Mobility: Supine to Sit;Sit to Supine;Rolling Rolling: Mod assist;+2 for physical assistance   Supine to sit: +2 for physical assistance;Mod assist Sit to supine: +2 for physical assistance;Max assist   General bed mobility comments: Pt requires assist for LEs and to lift trunk   Transfers Overall transfer level: Needs assistance Equipment used:  (wheelchair ) Transfers: Sit to/from [employer] CorporationStand;Stand Pivot Transfers Sit to Stand: Mod assist;+2 physical assistance Stand pivot transfers: Min assist;+2 physical assistance       General transfer comment: assist to power up into standing and assist for balance     Balance Overall balance assessment: Needs assistance Sitting-balance support: No upper extremity supported;Feet supported Sitting  balance-Leahy Scale: Fair     Standing balance support: Bilateral upper extremity supported Standing balance-Leahy Scale: Poor Standing balance comment: UE support and min A- mod A                            ADL either performed or assessed with clinical judgement   ADL Overall ADL's : Needs assistance/impaired                         Toilet Transfer: Moderate assistance;+2 for physical assistance;Stand-pivot;BSC   Toileting- Clothing Manipulation and Hygiene: Total assistance;Sit to/from stand       Functional mobility during ADLs: Moderate assistance;+2 for physical assistance;BankerWheelchair       Vision       Perception     Praxis      Cognition Arousal/Alertness: Awake/alert Behavior During Therapy: Flat affect Overall Cognitive Status: Within Functional Limits for tasks assessed                                          Exercises     Shoulder Instructions       General Comments VSS, but DOE 3/4.  Pt required multiple rest breaks ambulating in hallway - ambulated `12410ft with 7-8 rest breaks     Pertinent Vitals/ Pain       Pain Assessment: Faces Faces Pain Scale: Hurts a little bit Pain Location: sternum with transitional movements Pain Descriptors / Indicators: Discomfort;Sore Pain Intervention(s): Monitored during session  Home Living  Prior Functioning/Environment              Frequency  Min 2X/week        Progress Toward Goals  OT Goals(current goals can now be found in the care plan section)  Progress towards OT goals: Progressing toward goals     Plan Discharge plan needs to be updated    Co-evaluation                 AM-PAC PT "6 Clicks" Daily Activity     Outcome Measure   Help from another person eating meals?: A Little Help from another person taking care of personal grooming?: A Lot Help from another person toileting,  which includes using toliet, bedpan, or urinal?: A Lot Help from another person bathing (including washing, rinsing, drying)?: A Lot Help from another person to put on and taking off regular upper body clothing?: A Lot Help from another person to put on and taking off regular lower body clothing?: Total 6 Click Score: 12    End of Session    OT Visit Diagnosis: Unsteadiness on feet (R26.81)   Activity Tolerance Patient limited by fatigue   Patient Left in chair;with call bell/phone within reach   Nurse Communication Mobility status        Time: 1610-9604 OT Time Calculation (min): 67 min  Charges: OT General Charges $OT Visit: 1 Procedure OT Treatments $Self Care/Home Management : 23-37 mins $Therapeutic Activity: 23-37 mins  [employer], OTR/L [phone]    Jeani Hawking M 04/29/2017, 5:23 PM

## 2017-04-29 NOTE — Progress Notes (Addendum)
Patient ID: Edd FabianHarry D Hegner, male   DOB: 1960-11-22, 56 y.o.   MRN: [id number]   Advanced Heart Failure VAD Team Note  Subjective:    HVAD placed 6/28.  Returned to the OR that evening with high chest tube output, evacuation of mediastinal hematoma.   Extubated 6/29. Milrinone stopped on 7/4.  Evening of 7/4, patient developed ileus with respiratory compromise. NGT placed with 3 L suctioned out.  CXR with suspicion for aspiration PNA.  Patient had to be intubated.  He went into atrial fibrillation with RVR.  He became hypotensive and was started on norepinephrine and phenylephrine.  Amiodarone gtt begun.     Extubated again on 7/6.   Yesterday diuresed with IV lasix twice a daily. I/O not accurate. Weight up but on bed scale. Creatinine 3.2 unchanged. Received 1UPRBC for hgb 7.8>8.1   Denies SOB. No appetite.    HVAD INTERROGATION:  HVAD:  Flow 5.4 liters/min, speed 2820, power 5 W,  Peak 7.7 Trough 4.4  Objective:    Vital Signs:   Temp:  [98.1 F (36.7 C)-99 F (37.2 C)] 98.7 F (37.1 C) (07/10 0400) Pulse Rate:  [33-88] 86 (07/10 0600) Resp:  [25-35] 31 (07/10 0600) BP: (80-113)/(65-87) 104/82 (07/10 0600) SpO2:  [88 %-100 %] 98 % (07/10 0600) Arterial Line BP: (70-72)/(61-62) 70/61 (07/09 0900) Weight:  [103.1 kg (227 lb 4.7 oz)] 103.1 kg (227 lb 4.7 oz) (07/10 0359) Last BM Date: 04/28/17 Mean arterial Pressure  80-91  Intake/Output:   Intake/Output Summary (Last 24 hours) at 04/29/17 0758 Last data filed at 04/29/17 0600  Gross per 24 hour  Intake          4406.23 ml  Output             1390 ml  Net          3016.23 ml     Physical Exam   Physical Exam: CVP 10 GENERAL: Chronically ill appearing, in bed. NAD HEENT: normal  NECK: Supple, JVP ~10. Carotids  2+ bilaterally, no bruits.  No lymphadenopathy or thyromegaly appreciated.   CARDIAC:  Mechanical heart sounds with LVAD hum present.  LUNGS:  Decreased in the bases. .  ABDOMEN:  Soft, round, nontender,  positive bowel sounds x4.     LVAD exit site: Dressing dry and intact.  No erythema or drainage.  Stabilization device present and accurately applied.  Driveline dressing is being changed daily per sterile technique. EXTREMITIES:  Warm and dry, no cyanosis, clubbing, rash..  R and LLE 2+ edema with SCDs in place.   NEUROLOGIC:  Alert and oriented x 4.  Gait steady.  No aphasia.  No dysarthria.  Affect flat.       Telemetry    A sensed V paced 85     Labs   Basic Metabolic Panel:  Recent Labs Lab 04/24/17 0238  04/25/17 0452  04/26/17 0357 04/26/17 1739 04/27/17 0421 04/28/17 0334 04/29/17 0600  NA 133*  < > 131*  < > 132* 129* 130* 130* 128*  K 4.6  < > 4.3  < > 4.0 3.5 4.0 3.7 3.9  CL 101  < > 100*  < > 101 99* 99* 101 98*  CO2 23  --  20*  < > 21* 19* 21* 20* 21*  GLUCOSE 98  < > 211*  < > 170* 194* 162* 154* 165*  BUN 59*  < > 68*  < > 71* 72* 74* 80* 86*  CREATININE 2.60*  < >  3.00*  < > 3.26* 3.33* 3.20* 3.20* 3.16*  CALCIUM 7.8*  --  8.1*  < > 7.9* 7.9* 8.1* 7.7* 7.7*  MG 1.8  --  1.9  --  2.0 2.1 1.9 2.1  --   PHOS 4.0  --  5.1*  --  4.8*  --  3.9 3.0  --   < > = values in this interval not displayed.  Liver Function Tests:  Recent Labs Lab 04/23/17 2225 04/24/17 0238 04/25/17 0452 04/26/17 0357 04/28/17 0334  AST 24 22 23 18 16   ALT 11* 9* 8* 8* 8*  ALKPHOS 88 86 95 78 104  BILITOT 1.2 1.1 1.3* 1.1 1.0  PROT 5.9* 5.1* 5.7* 5.2* 4.9*  ALBUMIN 2.5* 2.3* 2.6* 2.3* 2.2*    Recent Labs Lab 04/23/17 2225  LIPASE 93*  AMYLASE 91   No results for input(s): AMMONIA in the last 168 hours.  CBC:  Recent Labs Lab 04/23/17 0414 04/24/17 0238  04/25/17 0452 04/26/17 0357 04/27/17 0421 04/28/17 0334 04/29/17 0320  WBC 11.2* 12.4*  --  20.4* 22.4* 17.6* 17.5* 16.4*  NEUTROABS 8.7* 11.6*  --  18.6*  --   --  14.7*  --   HGB 9.9* 10.1*  < > 11.0* 8.8* 8.6* 7.8* 8.1*  HCT 31.0* 31.9*  < > 33.4* 27.2* 26.6* 23.6* 26.1*  MCV 90.4 89.6  --  91.0 90.7  92.4 87.7 96.0  PLT 303 303  --  380 355 327 322 295  < > = values in this interval not displayed.  INR:  Recent Labs Lab 04/25/17 0452 04/26/17 0357 04/27/17 0421 04/28/17 0334 04/29/17 0320  INR 2.40 2.17 1.80 1.81 2.19    Other results:     Imaging   Dg Chest Port 1 View  Result Date: 04/28/2017 CLINICAL DATA:  LVAD EXAM: PORTABLE CHEST 1 VIEW COMPARISON:  04/27/2017 FINDINGS: Left subclavian AICD, right upper extremity PICC, and LVAD device are stable. Heart remains mildly enlarged. Very low lung volumes with bibasilar atelectasis and vascular crowding are stable. Vascular congestion has improved. IMPRESSION: Improved vascular congestion. Continued low volumes and vascular crowding. Electronically Signed   By: Jolaine Click M.D.   On: 04/28/2017 07:40     Medications:     Scheduled Medications: . aspirin EC  325 mg Oral Daily   Or  . aspirin  324 mg Per Tube Daily   Or  . aspirin  300 mg Rectal Daily  . bisacodyl  10 mg Oral Daily   Or  . bisacodyl  10 mg Rectal Daily  . Chlorhexidine Gluconate Cloth  6 each Topical Daily  . febuxostat  40 mg Oral Daily  . fentaNYL (SUBLIMAZE) injection  50 mcg Intravenous Once  . insulin aspart  0-24 Units Subcutaneous Q4H  . mouth rinse  15 mL Mouth Rinse BID  . sodium chloride flush  10-40 mL Intracatheter Q12H  . sodium chloride flush  3 mL Intravenous Q12H  . Warfarin - Physician Dosing Inpatient   Does not apply q1800    Infusions: . Marland KitchenTPN (CLINIMIX-E) Adult 105 mL/hr at 04/28/17 2000  . sodium chloride Stopped (04/19/17 1200)  . sodium chloride Stopped (04/21/17 1500)  . amiodarone 30 mg/hr (04/28/17 2325)  . heparin 2,300 Units/hr (04/29/17 0038)  . lactated ringers    . lactated ringers 10 mL/hr at 04/28/17 2000  . milrinone 0.25 mcg/kg/min (04/29/17 0312)  . norepinephrine (LEVOPHED) Adult infusion Stopped (04/26/17 1100)  . piperacillin-tazobactam (ZOSYN)  IV 3.375 g (  04/29/17 0314)    PRN  Medications: albuterol, docusate, hydrALAZINE, neomycin-bacitracin-polymyxin, ondansetron (ZOFRAN) IV, oxyCODONE, phenol, sodium chloride flush, sodium chloride flush, traMADol   Patient Profile   56 yo with CAD s/p CABG, ischemic cardiomyopathy/chronic systolic CHF, tophaceous gout, and CKD stage 3 was admitted for diuresis and consideration for LVAD placement. S/p HVAD on 6/28  Assessment/Plan:    1. Acute/chronic systolic CHF: Ischemic cardiomyopathy.  St Jude ICD.  Echo (6/18) with EF 15%, mildly dilated RV with moderately decreased systolic function.  RHC 6/22 with elevated filling pressures and evidence of RV failure though PAPi score 1.95 so not prohibitive.  He has been seen at [facility] and turned down for transplant with severe tophaceous gout and concern for worsening on transplant meds.  He was diuresed extensively, repeat RHC 6/26 with marked improvement in filling pressures and stable cardiac output on milrinone.  HVAD placement 6/28 and had to return to OR to evacuate mediastinal hematoma.  He had been weaned off pressors and milrinone, but developed ileus and likely aspiration event 7/4, re-intubated, developed afib/RVR, and now back on norepinephrine (phenylephrine stopped).  On 7/6, he was extubated.  CO-OX stable. Cut milrinone back to 0.125 mcg. Anticipate stopped tomorrow.  Volume status improving. CVP 10. CXR improving. Hold diuretics today.  Renal function unchanged 3.2 Add 10 mg hydralazine tid + 15 mg imdur daily.  Continue ASA 325 mg daily. INR 2.1. Off heparin. Continue coumadin.  - No dig/spir/arb with levated creatinine.    2. AKI on CKD stage 3: Suspect a component of peri-op ATN, worsened with ileus, vomiting, intubation/sedation.   -Creatinine 3.2. Keep Map >70.  3. Anemia: Chronic, seen by GI pre-op, they did not feel that he needed endoscopy.  FOBT negative initially. He had 1 unit PRBCs 7/1 with appropriate bump.  No bleeding problems. Hgb 8.1 after 1UPRBCs.   4.  CAD: s/p CABG 2012.   - Stable.   5. Gout: Severe tophaceous gout. No complaint of gouty pain today.  - Continue uloric. Holding colchicine with AKI.     6. Atrial fibrillation: Developed atrial fibrillation with RVR in setting of aspiration PNA on 7/4.   Stop amio drip. Transition to amio 200 mg twice a day.  - On coumadin 7. Malnutrition: Major issue with baseline poor nutritional status and now with ileus.  Albumin 2.2  Nutrition following, now getting TNA.  Advance diet to soft.  - 8. Aspiration PNA with acute hypoxemic respiratory failure on 7/4: He was extubated on 7/6.  -WBC trending down to 16.4.  - Tracheal aspirate with Klebsiella and citrobacter. Both sensitive to Zosyn.  - Continue Zosyn.  9. Ileus: Vomiting on 7/4 with aspiration PNA.  Resolved.  -10. Deconditioning: - PT following. Consult CIR.     Add [employer].  I reviewed the HVAD parameters from today, and compared the results to the patient's prior recorded data.  No programming changes were made.  The HVAD is functioning within specified parameters.     Tonye Becket, NP 04/29/2017, 7:58 AM  VAD Team --- VAD ISSUES ONLY--- Pager [phone] (7am - 7am) Advanced Heart Failure Team  Pager [phone] (M-F; 7a - 4p)  Please contact [facility] for night-coverage after hours (4p -7a ) and weekends on [email or web address]  Patient seen and examined with Tonye Becket, NP. We discussed all aspects of the encounter. I agree with the assessment and plan as stated above.   Remains tenuous. Volume status up but improving. Renal function has plateuaed. Hopefully  will start improving soon.  CVP 10. Co-ox ok. Will start oral diuretics today. Has severe LE edema. Place UNNA boots. Ileus improving. Advance to soft diet. Continue zosyn for aspiration PNA. Continue to mobilize. VAD parameters stable. D/w Dr. Donata Clay.  Arvilla Meres, MD  9:09 AM

## 2017-04-29 NOTE — Progress Notes (Signed)
Rehab admissions - Please see rehab consult done by Dr. Allena KatzPatel yesterday.  We are following for tolerance and functional levels to determine if inpatient rehab stay is warranted.  Not medically ready today for inpatient rehab admission.  My partners will follow up as I am off Wed-Fri this week.  Ottie GlazierBarbara Boyette can be reached at [phone] for questions.

## 2017-04-29 NOTE — Progress Notes (Signed)
LVAD Inpatient Coordinator Rounding Note:  Admitted 03/27/2017 due to A/C Heart failure.   HeartWare LVAD implanted on 04/14/2017 by Dr. Laneta SimmersBartle as DT VAD.  Vital signs: HR: 85 (atrial paced) Doppler Pressure: not done BP: 95/77 (85) O2 Sat:  97 on Room Air Wt:207>209 > 213 >213 > 208>203>220>227lbs   LVAD interrogation reveals:  Speed:  2820 Flow:  5.3 Power:  4.9 Alarms:none Peak: 8 Trough: 4 HCT: 26 Low flow alarm setting: 3.5 High watt alarm setting:   7.0  Suction:  Turned on Lavare cycle:  on  Blood Products: 6/28> 5 PRBC's, 6 FFP 7/1> 1 PRBC 7/9> 1 PRBC  Gtts: Milrinone restarted 7/8 currently @ 0.125 mcg/kg/min Levo stopped 04/26/17 Amiodarone 30 mg/hr Heparin 1900 units/hr  TPN - started 04/24/17 for nutritional support  Arrhythmia: 04/24/17 - Afib with RVR - started amiodarone  Respiratory: 04/23/17 - re-intubated due to respiratory failure secondary to suspected aspiration pneumonia 04/25/17-extubated  Drive Line: Daily Dressing Kits with Aquacell AG silver strips per protocol.   Labs:  LDH trend:160 (pre VAD)>227>215>191>212>207>222>217>219>163>207  INR trend: 1.31>1.45>1.44>1.25>1.33>1.68>2.61>2.4>1.81>2.19  Anticoagulation Plan: -INR Goal: 2-2.5 started 04/19/17 -ASA Dose: 325 mg  Adverse Events on VAD: -Return to OR 6/28  with high chest tube output, evacuation of mediastinal hematoma -Ileus with vomiting on 04/23/17; re-intubation for acute respiratory failure;  probable aspiration pneumonia    Plan/Recommendations:  1. Will hold VAD teaching until patient stabilizes.  2. Continue daily dressing changes.     Carlton AdamSarah Herbert RN, VAD Coordinator 24/7 pager [phone]

## 2017-04-30 DIAGNOSIS — J690 Pneumonitis due to inhalation of food and vomit: Secondary | ICD-10-CM

## 2017-04-30 LAB — GLUCOSE, CAPILLARY
GLUCOSE-CAPILLARY: 217 mg/dL — AB (ref 65–99)
Glucose-Capillary: 117 mg/dL — ABNORMAL HIGH (ref 65–99)
Glucose-Capillary: 141 mg/dL — ABNORMAL HIGH (ref 65–99)
Glucose-Capillary: 160 mg/dL — ABNORMAL HIGH (ref 65–99)
Glucose-Capillary: 168 mg/dL — ABNORMAL HIGH (ref 65–99)
Glucose-Capillary: 171 mg/dL — ABNORMAL HIGH (ref 65–99)

## 2017-04-30 LAB — BASIC METABOLIC PANEL
Anion gap: 9 (ref 5–15)
BUN: 88 mg/dL — ABNORMAL HIGH (ref 6–20)
CO2: 21 mmol/L — ABNORMAL LOW (ref 22–32)
Calcium: 7.8 mg/dL — ABNORMAL LOW (ref 8.9–10.3)
Chloride: 99 mmol/L — ABNORMAL LOW (ref 101–111)
Creatinine, Ser: 3 mg/dL — ABNORMAL HIGH (ref 0.61–1.24)
GFR calc Af Amer: 26 mL/min — ABNORMAL LOW (ref >60)
GFR calc non Af Amer: 22 mL/min — ABNORMAL LOW (ref >60)
Glucose, Bld: 144 mg/dL — ABNORMAL HIGH (ref 65–99)
Potassium: 3.8 mmol/L (ref 3.5–5.1)
Sodium: 129 mmol/L — ABNORMAL LOW (ref 135–145)

## 2017-04-30 LAB — COOXEMETRY PANEL
Carboxyhemoglobin: 1.6 % — ABNORMAL HIGH (ref 0.5–1.5)
METHEMOGLOBIN: 1.3 % (ref 0.0–1.5)
O2 SAT: 72.3 %
TOTAL HEMOGLOBIN: 8.5 g/dL — AB (ref 12.0–16.0)

## 2017-04-30 LAB — PROTIME-INR
INR: 2.09
PROTHROMBIN TIME: 23.8 s — AB (ref 11.4–15.2)

## 2017-04-30 LAB — CBC
HEMATOCRIT: 25.1 % — AB (ref 39.0–52.0)
HEMOGLOBIN: 8.4 g/dL — AB (ref 13.0–17.0)
MCH: 29.5 pg (ref 26.0–34.0)
MCHC: 33.5 g/dL (ref 30.0–36.0)
MCV: 88.1 fL (ref 78.0–100.0)
Platelets: 282 10*3/uL (ref 150–400)
RBC: 2.85 MIL/uL — AB (ref 4.22–5.81)
RDW: 19 % — ABNORMAL HIGH (ref 11.5–15.5)
WBC: 11.5 10*3/uL — ABNORMAL HIGH (ref 4.0–10.5)

## 2017-04-30 LAB — LACTATE DEHYDROGENASE: LDH: 217 U/L — ABNORMAL HIGH (ref 98–192)

## 2017-04-30 LAB — MAGNESIUM: MAGNESIUM: 1.9 mg/dL (ref 1.7–2.4)

## 2017-04-30 MED ORDER — WARFARIN SODIUM 7.5 MG PO TABS
7.5000 mg | ORAL_TABLET | Freq: Once | ORAL | Status: AC
  Administered 2017-04-30: 7.5 mg via ORAL
  Filled 2017-04-30: qty 1

## 2017-04-30 MED ORDER — MILRINONE LACTATE IN DEXTROSE 20-5 MG/100ML-% IV SOLN
0.1250 ug/kg/min | INTRAVENOUS | Status: DC
  Administered 2017-04-30 – 2017-05-05 (×7): 0.125 ug/kg/min via INTRAVENOUS
  Filled 2017-04-30 (×6): qty 100

## 2017-04-30 NOTE — Progress Notes (Signed)
LVAD Inpatient Coordinator Rounding Note:  Admitted 03/28/2017 due to A/C Heart failure.   HeartWare LVAD implanted on 11-04-16 by Dr. Laneta SimmersBartle as DT VAD.  Vital signs: HR: 84 Doppler Pressure: 86 BP: 99/78 (87) O2 Sat:  98 on Room Air Wt:207>209 > 213 >213 > 208>203>220>227lbs>226   LVAD interrogation reveals:  Speed:  2820 Flow:  5.3 Power:  4.8 Alarms:none Peak: 7.7 Trough: 4.8 HCT: 25 Low flow alarm setting: 3.5 High watt alarm setting:   7.0  Suction:  Turned on Lavare cycle:  on  Blood Products: 6/28> 5 PRBC's, 6 FFP 7/1> 1 PRBC 7/9> 1 PRBC  Gtts: Milrinone restarted 7/8 currently @ 0.125 mcg/kg/min Levo stopped 04/26/17 Amiodarone stopped 04/29/17 Heparin stopped 04/29/17  TPN - started 04/24/17 for nutritional support  Arrhythmia: 04/24/17 - Afib with RVR - started amiodarone  Respiratory: 04/23/17 - re-intubated due to respiratory failure secondary to suspected aspiration pneumonia 04/25/17-extubated  Drive Line: Daily Dressing Kits with Aqu[employer] silver strips per protocol.    Labs:  LDH trend:160 (pre VAD)>227>215>191>212>207>222>217>219>163>207>217  INR trend: 1.31>1.45>1.44>1.25>1.33>1.68>2.61>2.4>1.81>2.19>2.09  Anticoagulation Plan: -INR Goal: 2-2.5 started 04/19/17 -ASA Dose: 325 mg  Adverse Events on VAD: -Return to OR 6/28  with high chest tube output, evacuation of mediastinal hematoma -Ileus with vomiting on 04/23/17; re-intubation for acute respiratory failure;  probable aspiration pneumonia    Plan/Recommendations:  1. VAD discharge teaching started today. Patients wife, James [name], changed dressing under close supervision. 2. Continue daily dressing changes.   Marcellus ScottLesley Wilson RN, VAD Coordinator 24/7 pager [phone]

## 2017-04-30 NOTE — Progress Notes (Signed)
Patient ID: James [name], male   DOB: Dec 05, 1960, 56 y.o.   MRN: [id number]  This NP visited patient at the bedside as a follow up for palliative needs and emotional support.  Continue to visit patient regularly for brief visits to offer support, today I  visit at bedside with patient and his wife.    Created space and opportunity for patient and his wife to share thoughts and feeling around current medical experience.  James [name] tells me he remains optimistic and is "pushing through it".  I encouraged him to enhance his PT with regular AROM exercises; shoulder and ankle rolls.  Discussed likelihood for need for ongoing physical therapy.  James [name] will "do whatever it takes"  [name] verbalizes concern about "missed opportunity for education " 2/2 to her vigorous work schedule.     Discussed with [name]/ nurse educator wife's concern and they have worked out ways to schedule education sessions.   Questions and concerns addressed  Family encouraged to call with questions or concerns  Time in  1020        Time out   1055  Total  Time spent on the unit was 35 minutes  Greater than 50% of the time was spent in counseling and coordination of care  [name] NP  Palliative Medicine Team Team Phone # [phone] Pager [phone]

## 2017-04-30 NOTE — Progress Notes (Signed)
Physical Therapy Treatment Patient Details Name: James [name] MRN: [id number] DOB: 1961/06/15 Today's Date: 04/30/2017    History of Present Illness  Pt adm with acute on chronic heart failure and underwent Heartware HVAD implant on 6/28. Returned to OR later that day for evacuation of mediastinal hematoma. On 7/4 developed ileus with likely aspiration and intubated 7/4-7/6. PMH - gout, DMII, HTN, CKD, CAD S/P CABGx6 2012, chronic systolic heart failure and St jude ICD. presents now s/p heartware HVAD implant    PT Comments    Pt continues to make progress toward mobility goals. Pt needs assistance/cues for management of HVAD equipment. Current plan remains appropriate.   Follow Up Recommendations  CIR;Supervision for mobility/OOB     Equipment Recommendations  Other (comment) (TBD)    Recommendations for Other Services Rehab consult     Precautions / Restrictions Precautions Precautions: Sternal Precaution Comments: Heartware HVAD, external pacer wires, A-line Restrictions Weight Bearing Restrictions: Yes Other Position/Activity Restrictions: Sternal precautions    Mobility  Bed Mobility Overal bed mobility: Needs Assistance Bed Mobility: Rolling;Sidelying to Sit Rolling: Mod assist Sidelying to sit: Mod assist       General bed mobility comments: assist at bilat LE and trunk to come into sitting; cues for sequencing and technique  Transfers Overall transfer level: Needs assistance Equipment used: Rolling walker (2 wheeled) Transfers: Sit to/from [employer] Sit to Stand: Min assist Stand pivot transfers: Min assist;+2 safety/equipment       General transfer comment: cues to maintain precautions from EOB and BSC; assist to power up into standing with pt using momentum   Ambulation/Gait Ambulation/Gait assistance: +2 safety/equipment;Min assist Ambulation Distance (Feet): 375 Feet Assistive device: Rolling walker (2 wheeled) Gait  Pattern/deviations: Step-through pattern;Decreased stride length;Trunk flexed Gait velocity: decreased   General Gait Details: 3 brief standing rest breaks; cues for posture and forward gaze; pt with slow, grossly steady gait with unsteadiness when turning but no LOB; HR up to 130s and RR into 30s with ambulation   Stairs            Wheelchair Mobility    Modified Rankin (Stroke Patients Only)       Balance Overall balance assessment: Needs assistance Sitting-balance support: No upper extremity supported;Feet supported Sitting balance-Leahy Scale: Fair     Standing balance support: Bilateral upper extremity supported Standing balance-Leahy Scale: Poor                              Cognition Arousal/Alertness: Awake/alert Behavior During Therapy: WFL for tasks assessed/performed Overall Cognitive Status: Within Functional Limits for tasks assessed                                 General Comments: pt smiling more today and appeared less anxious about mobility      Exercises      General Comments General comments (skin integrity, edema, etc.): pt educated given by therapist for management of HVAD; pt continues to need cues but needs less physical assist; it helps if the heartware is on bedside table instead of in pt's lap and if he is wearing his glasses      Pertinent Vitals/Pain Pain Assessment: No/denies pain Pain Intervention(s): Monitored during session;Repositioned    Home Living                      Prior  Function            PT Goals (current goals can now be found in the care plan section) Progress towards PT goals: Progressing toward goals    Frequency    Min 3X/week      PT Plan Current plan remains appropriate    Co-evaluation              AM-PAC PT "6 Clicks" Daily Activity  Outcome Measure  Difficulty turning over in bed (including adjusting bedclothes, sheets and blankets)?: Total Difficulty  moving from lying on back to sitting on the side of the bed? : Total Difficulty sitting down on and standing up from a chair with arms (e.g., wheelchair, bedside commode, etc,.)?: Total Help needed moving to and from a bed to chair (including a wheelchair)?: A Little Help needed walking in hospital room?: A Little Help needed climbing 3-5 steps with a railing? : A Lot 6 Click Score: 11    End of Session   Activity Tolerance: Patient tolerated treatment well Patient left: with call bell/phone within reach;in chair;with family/visitor present Nurse Communication: Mobility status PT Visit Diagnosis: Unsteadiness on feet (R26.81);Muscle weakness (generalized) (M62.81);Difficulty in walking, not elsewhere classified (R26.2)     Time: [id number] PT Time Calculation (min) (ACUTE ONLY): 60 min  Charges:  $Gait Training: 23-37 mins $Therapeutic Activity: 23-37 mins                    G Codes:       Erline LevineKellyn Tinleigh Whitmire, PTA Pager: [phone]     Carolynne EdouardKellyn R Fabian Coca 04/30/2017, 4:36 PM

## 2017-04-30 NOTE — Progress Notes (Signed)
PHARMACY - ADULT TOTAL PARENTERAL NUTRITION CONSULT NOTE   Pharmacy Consult:  TPN Indication: Ileus and poor baseline nutritional status  Patient Measurements: Height: 6' (182.9 cm) Weight: 226 lb (102.5 kg) IBW/kg (Calculated) : 77.6 TPN AdjBW (KG): 96.9 Body mass index is 30.65 kg/m.   Assessment:  [age] YOM with severe malnutrition and severely depleted muscle mass related to chornic CHF post HVAD placement on 03/28/2017.  Patient was extubated in 04/18/17 and started on a soft diet on 04/20/17.  He decompensated overnight on 04/23/17 requiring re-intubation and found to have an ileus after vomiting fecal material.  NGT was placed and 3000 mL suctioned. Pharmacy consulted to manage TPN and now weaning to OFF today.   Plan:  Currently weaning TPN at 3840ml/hr - DC after current bag  Lysle Pearlachel Tayvon Culley, PharmD, BCPS 04/30/2017 8:35 AM

## 2017-04-30 NOTE — Progress Notes (Signed)
13 Days Post-Op Procedure(s) (LRB): MEDIASTINAL EXPLORATION (N/A) Subjective: Renal fx cont to improve with decrfeased creat and increased urine output Po intake better- wean off TPN with BUN 88 VAD parameters satisfactory Incisions clean, dry CXR improved - Klebsiella and Citrobacter in tracheal aspirate, cont Zosyn and DC Vancomycin Daily lasix dosing, DC foley Objective: Vital signs in last 24 hours: Temp:  [97.5 F (36.4 C)-98.2 F (36.8 C)] 98.2 F (36.8 C) (07/11 0400) Pulse Rate:  [29-100] 65 (07/11 0700) Cardiac Rhythm: Normal sinus rhythm (07/11 0400) Resp:  [20-35] 25 (07/11 0700) BP: (88-109)/(70-88) 102/84 (07/11 0600) SpO2:  [93 %-100 %] 100 % (07/11 0700) Weight:  [226 lb (102.5 kg)] 226 lb (102.5 kg) (07/11 0600)  Hemodynamic parameters for last 24 hours: CVP:  [9 mmHg-10 mmHg] 9 mmHg  Intake/Output from previous day: 07/10 0701 - 07/11 0700 In: 2726.2 [P.O.:420; I.V.:2156.2; IV Piggyback:150] Out: 1726 [Urine:1725; Stool:1] Intake/Output this shift: No intake/output data recorded.       Exam    General- alert and comfortable   Lungs- clear without rales, wheezes   Cor- regular rate and rhythm, no murmur , gallop, VAD hum   Abdomen- soft, non-tender   Extremities - warm, non-tender, minimal edema   Neuro- oriented, appropriate, no focal weakness   Lab Results:  Recent Labs  04/29/17 0320 04/30/17 0315  WBC 16.4* 11.5*  HGB 8.1* 8.4*  HCT 26.1* 25.1*  PLT 295 282   BMET:  Recent Labs  04/29/17 0600 04/30/17 0315  NA 128* 129*  K 3.9 3.8  CL 98* 99*  CO2 21* 21*  GLUCOSE 165* 144*  BUN 86* 88*  CREATININE 3.16* 3.00*  CALCIUM 7.7* 7.8*    PT/INR:  Recent Labs  04/30/17 0315  LABPROT 23.8*  INR 2.09   ABG    Component Value Date/Time   PHART 7.406 04/28/2017 0407   HCO3 20.2 04/28/2017 0407   TCO2 21 04/28/2017 0407   ACIDBASEDEF 4.0 (H) 04/28/2017 0407   O2SAT 72.3 04/30/2017 0315   CBG (last 3)   Recent Labs  04/29/17 1954 04/29/17 2336 04/30/17 0316  GLUCAP 123* 171* 141*    Assessment/Plan: S/P Procedure(s) (LRB): MEDIASTINAL EXPLORATION (N/A) Coumadin per pharmD   LOS: 19 days    Kathlee Nationseter Van Trigt III 04/30/2017

## 2017-04-30 NOTE — Progress Notes (Signed)
Patient ID: James [name], male   DOB: [birth year], 56 y.o.   MRN: [id number]   Advanced Heart Failure VAD Team Note  Subjective:    HVAD placed 6/28.  Returned to the OR that evening with high chest tube output, evacuation of mediastinal hematoma.   Extubated 6/29. Milrinone stopped on 7/4.  Evening of 7/4, patient developed ileus with respiratory compromise. NGT placed with 3 L suctioned out.  CXR with suspicion for aspiration PNA.  Patient had to be intubated.  He went into atrial fibrillation with RVR.  He became hypotensive and was started on norepinephrine and phenylephrine.  Amiodarone gtt begun.     Extubated again on 7/6.   Yesterday transitioned to po amio, po torsemide, and milrinone was cut back to 0.125 mcg. Todays CO-OX is 72%.   Overall feeling ok. Appetite a little better.   Creatinine 3.16-> 3.00  HVAD INTERROGATION:  HVAD:  Flow 5.3 liters/min, speed 2820, power 5 W,  Peak 7.1 Trough 3.3 Suction on. Lavare On  Objective:    Vital Signs:   Temp:  [97.5 F (36.4 C)-98.2 F (36.8 C)] 98.2 F (36.8 C) (07/11 0400) Pulse Rate:  [29-100] 65 (07/11 0700) Resp:  [20-35] 25 (07/11 0700) BP: (88-109)/(70-88) 102/84 (07/11 0600) SpO2:  [93 %-100 %] 100 % (07/11 0700) Weight:  [102.5 kg (226 lb)] 102.5 kg (226 lb) (07/11 0600) Last BM Date: 04/29/17 Mean arterial Pressure  80-91  Intake/Output:   Intake/Output Summary (Last 24 hours) at 04/30/17 0729 Last data filed at 04/30/17 0700  Gross per 24 hour  Intake           2587.4 ml  Output             1726 ml  Net            861.4 ml     Physical Exam   Physical Exam: CVP 9-10 GENERAL: Appears fatigued. NAD. In bed.  HEENT: normal  NECK: Supple, JVP ~10 .  2+ bilaterally, no bruits.  No lymphadenopathy or thyromegaly appreciated.   CARDIAC:  Mechanical heart sounds with LVAD hum present.  LUNGS:  Decreased   ABDOMEN:  Soft, round, nontender, positive bowel sounds x4.     LVAD exit site:  Dressing dry and  intact.  No erythema or drainage.  Stabilization device present and accurately applied.  Driveline dressing is being changed daily per sterile technique. EXTREMITIES:  Warm and dry, no cyanosis, clubbing, rash or R and LLE Unna Boots 2-3+ edema  NEUROLOGIC:  Alert and oriented x 4.  Gait steady.  No aphasia.  No dysarthria.  Affect pleasant.      Telemetry  A sensed V paced 80s Personally reviewed     Labs   Basic Metabolic Panel:  Recent Labs Lab 04/24/17 0238  04/25/17 0452  04/26/17 0357 04/26/17 1739 04/27/17 0421 04/28/17 0334 04/29/17 0600 04/30/17 0315  NA 133*  < > 131*  < > 132* 129* 130* 130* 128* 129*  K 4.6  < > 4.3  < > 4.0 3.5 4.0 3.7 3.9 3.8  CL 101  < > 100*  < > 101 99* 99* 101 98* 99*  CO2 23  --  20*  < > 21* 19* 21* 20* 21* 21*  GLUCOSE 98  < > 211*  < > 170* 194* 162* 154* 165* 144*  BUN 59*  < > 68*  < > 71* 72* 74* 80* 86* 88*  CREATININE 2.60*  < > 3.00*  < >  3.26* 3.33* 3.20* 3.20* 3.16* 3.00*  CALCIUM 7.8*  --  8.1*  < > 7.9* 7.9* 8.1* 7.7* 7.7* 7.8*  MG 1.8  --  1.9  --  2.0 2.1 1.9 2.1  --  1.9  PHOS 4.0  --  5.1*  --  4.8*  --  3.9 3.0  --   --   < > = values in this interval not displayed.  Liver Function Tests:  Recent Labs Lab 04/23/17 2225 04/24/17 0238 04/25/17 0452 04/26/17 0357 04/28/17 0334  AST 24 22 23 18 16   ALT 11* 9* 8* 8* 8*  ALKPHOS 88 86 95 78 104  BILITOT 1.2 1.1 1.3* 1.1 1.0  PROT 5.9* 5.1* 5.7* 5.2* 4.9*  ALBUMIN 2.5* 2.3* 2.6* 2.3* 2.2*    Recent Labs Lab 04/23/17 2225  LIPASE 93*  AMYLASE 91   No results for input(s): AMMONIA in the last 168 hours.  CBC:  Recent Labs Lab 04/24/17 0238  04/25/17 0452 04/26/17 0357 04/27/17 0421 04/28/17 0334 04/29/17 0320 04/30/17 0315  WBC 12.4*  --  20.4* 22.4* 17.6* 17.5* 16.4* 11.5*  NEUTROABS 11.6*  --  18.6*  --   --  14.7*  --   --   HGB 10.1*  < > 11.0* 8.8* 8.6* 7.8* 8.1* 8.4*  HCT 31.9*  < > 33.4* 27.2* 26.6* 23.6* 26.1* 25.1*  MCV 89.6  --  91.0  90.7 92.4 87.7 96.0 88.1  PLT 303  --  380 355 327 322 295 282  < > = values in this interval not displayed.  INR:  Recent Labs Lab 04/26/17 0357 04/27/17 0421 04/28/17 0334 04/29/17 0320 04/30/17 0315  INR 2.17 1.80 1.81 2.19 2.09    Other results:     Imaging   Dg Chest Port 1 View  Result Date: 04/29/2017 CLINICAL DATA:  Left ventricular assistance device. EXAM: PORTABLE CHEST 1 VIEW COMPARISON:  Radiograph April 28, 2017. FINDINGS: Stable cardiomegaly. Single lead left-sided pacemaker is unchanged in position. Left ventricular assistance device is again noted and unchanged. Stable central pulmonary vascular congestion is noted. Hypoinflation the lungs is noted with bibasilar subsegmental atelectasis. Right-sided PICC line is unchanged with distal tip in expected position of the SVC. No pneumothorax or significant pleural effusion is noted. Bony thorax is unremarkable. IMPRESSION: Stable cardiomegaly and central pulmonary vascular congestion. Stable position of left ventricular assistance device. Stable hypoinflation of the lungs is noted with bibasilar subsegmental atelectasis. Electronically Signed   By: Lupita Raider, M.D.   On: 04/29/2017 08:08     Medications:     Scheduled Medications: . amiodarone  200 mg Oral BID  . aspirin EC  325 mg Oral Daily   Or  . aspirin  324 mg Per Tube Daily   Or  . aspirin  300 mg Rectal Daily  . Chlorhexidine Gluconate Cloth  6 each Topical Daily  . famotidine  20 mg Oral QHS  . febuxostat  40 mg Oral Daily  . fentaNYL (SUBLIMAZE) injection  50 mcg Intravenous Once  . hydrALAZINE  10 mg Oral Q8H  . insulin aspart  0-24 Units Subcutaneous Q4H  . isosorbide mononitrate  15 mg Oral Daily  . mouth rinse  15 mL Mouth Rinse BID  . sodium chloride flush  10-40 mL Intracatheter Q12H  . sodium chloride flush  3 mL Intravenous Q12H  . torsemide  40 mg Oral Daily  . Warfarin - Pharmacist Dosing Inpatient   Does not apply [phone]  Infusions: . Marland KitchenTPN (CLINIMIX-E) Adult 40 mL/hr at 04/29/17 2000  . sodium chloride Stopped (04/19/17 1200)  . sodium chloride Stopped (04/21/17 1500)  . lactated ringers    . lactated ringers 20 mL/hr at 04/29/17 2000  . milrinone 0.125 mcg/kg/min (04/29/17 2241)  . norepinephrine (LEVOPHED) Adult infusion Stopped (04/26/17 1100)  . piperacillin-tazobactam (ZOSYN)  IV 3.375 g (04/30/17 0306)    PRN Medications: albuterol, docusate, hydrALAZINE, neomycin-bacitracin-polymyxin, ondansetron (ZOFRAN) IV, oxyCODONE, phenol, sodium chloride flush, sodium chloride flush, traMADol   Patient Profile   56 yo with CAD s/p CABG, ischemic cardiomyopathy/chronic systolic CHF, tophaceous gout, and CKD stage 3 was admitted for diuresis and consideration for LVAD placement. S/p HVAD on 6/28  Assessment/Plan:    1. Acute/chronic systolic CHF: Ischemic cardiomyopathy.  St Jude ICD.  Echo (6/18) with EF 15%, mildly dilated RV with moderately decreased systolic function.  RHC 6/22 with elevated filling pressures and evidence of RV failure though PAPi score 1.95 so not prohibitive.  He has been seen at [facility] and turned down for transplant with severe tophaceous gout and concern for worsening on transplant meds.  He was diuresed extensively, repeat RHC 6/26 with marked improvement in filling pressures and stable cardiac output on milrinone.  HVAD placement 6/28 and had to return to OR to evacuate mediastinal hematoma.  He had been weaned off pressors and milrinone, but developed ileus and likely aspiration event 7/4, re-intubated, developed afib/RVR, and now back on norepinephrine (phenylephrine stopped).  On 7/6, he was extubated.  Todays CO-OX is 72%. Continue milrinone for now.   Volume status improving. CVP 9-10 Continue torsemide 40 mg daily  Maps 80s. Continue 10 mg hydralazine tid + 15 mg imdur daily.  Continue ASA 325 mg daily. INR 2.1. Continue coumadin.  - No dig/spir/arb with levated creatinine.     2. AKI on CKD stage 3: Suspect a component of peri-op ATN, worsened with ileus, vomiting, intubation/sedation.   -Creatinine coming down to 3. Keep Map >70 and <90 3. Anemia: Chronic, seen by GI pre-op, they did not feel that he needed endoscopy.  FOBT negative initially. He had 1 unit PRBCs 7/1 and 7/9 with appropriate bump. .  Todays Hgb 8.4  4. CAD: s/p CABG 2012.   - Stable.   5. Gout: Severe tophaceous gout. No complaint of gouty pain today.  - Continue uloric. Holding colchicine with AKI.     6. Atrial fibrillation: Developed atrial fibrillation with RVR in setting of aspiration PNA on 7/4.   Continue amio 200 mg a day.   - On coumadin. INR 2.09  7. Malnutrition: Major issue with baseline poor nutritional status and now with ileus.  Albumin 2.2  Nutrition following, now getting TNA.  Advance diet to soft.  - 8. Aspiration PNA with acute hypoxemic respiratory failure on 7/4: He was extubated on 7/6.  -WBC continues to trend down. WBC down to 11.5.  - Tracheal aspirate with Klebsiella and citrobacter. Both sensitive to Zosyn.  - Continue Zosyn until 7/12 9. Ileus: Vomiting on 7/4 with aspiration PNA.  Resolved.  -10. Deconditioning: - PT following. Consult CIR.    Dr Maren Beach and bedside. Discussed plan.  HVAD parameters stable.  I reviewed the HVAD parameters from today, and compared the results to the patient's prior recorded data.  No programming changes were made.  The HVAD is functioning within specified parameters.     Tonye Becket, NP 04/30/2017, 7:29 AM  VAD Team --- VAD ISSUES ONLY--- Pager [phone] (7am -  7am) Advanced Heart Failure Team  Pager [phone] (M-F; 7a - 4p)  Please contact [facility] for night-coverage after hours (4p -7a ) and weekends on [email or web address]  Patient seen and examined with Tonye Becket, NP. We discussed all aspects of the encounter. I agree with the assessment and plan as stated above.   Remains tenuous but continues to make daily progress. Co-ox  improving. AKI starting to resolve. Will continue RV support with milrinone for now as he deteriorated over the weekend without it. Continue oral diuretics for slow diuresis as he still is about 15 pounds overloaded. Ileus improving. Continue to advance diet. INR therapeutic. D/w PharmD.   VAD parameters ok. D/w Dr. Donata Clay. Continue to mobilize and progress. Consult CIR.  Arvilla Meres, MD  9:13 AM

## 2017-04-30 NOTE — Progress Notes (Signed)
    VAD Teaching Note:   Patient along with Elease HashimotoMarcie (primary caregiver) and have received VAD teaching and education and verbalize the understanding of the HVADsystem operation.  The patient's wife has been trained on the driveline management sterile dressing change and have performed the dressing under my supervision. I have reviewed the daily flow sheet that the patient will record his VAD parameters along with his weights and driveline dressing status with patient and family.   Patient has had extensive education about:  1) Who to call for a VAD emergency.  2) What is considered a VAD emergency.  3) What back-up equipment needs to be with him at  all times.  4) How to take care of his driveline and equipment.  5) How to change his system controller.  6) The restrictions he has with having the pump.  7) How to change power sources.  8) What the critical and advisory alarms are for her VAD.  system.  9) The importance of Coumadin and following up for clinic appointments.  He reports he feels comfortable with his VAD system.  Discharge information materials given to patient include the VAD Booklet.  All questions have been answered.       Marcellus ScottLesley Wilson RN, VAD Coordinator 24/7 pager [phone]

## 2017-04-30 NOTE — Progress Notes (Signed)
Inpatient Rehabilitation  Met with patient and spouse at bedside to discuss team's recommendation for IP Rehab.  Shared booklets and answered questions.  Plan to follow along for timing of medical readiness, insurance authorization, and bed availability.  Please call with questions.   Carmelia Roller., CCC/SLP Admission Coordinator  [place]  Cell [phone]

## 2017-04-30 NOTE — Progress Notes (Signed)
ANTICOAGULATION CONSULT NOTE - Follow Up Consult  Pharmacy Consult for Warfarin Indication: HVAD  No Active Allergies  Patient Measurements: Height: 6' (182.9 cm) Weight: 226 lb (102.5 kg) IBW/kg (Calculated) : 77.6  Vital Signs: Temp: 98.4 F (36.9 C) (07/11 1100) Temp Source: Oral (07/11 1100) BP: 102/76 (07/11 0800) Pulse Rate: 85 (07/11 0800)  Labs:  Recent Labs  04/28/17 0334 04/28/17 0337 04/28/17 1310 04/28/17 2328 04/29/17 0320 04/29/17 0600 04/30/17 0315  HGB 7.8*  --   --   --  8.1*  --  8.4*  HCT 23.6*  --   --   --  26.1*  --  25.1*  PLT 322  --   --   --  295  --  282  LABPROT 21.2*  --   --   --  24.7*  --  23.8*  INR 1.81  --   --   --  2.19  --  2.09  HEPARINUNFRC  --  <0.10* 0.23* 0.20*  --   --   --   CREATININE 3.20*  --   --   --   --  3.16* 3.00*    Estimated Creatinine Clearance: 34.9 mL/min (A) (by C-G formula based on SCr of 3 mg/dL (H)).   Assessment: 56yom s/p HVAD 6/28, started on coumadin per MD 6/30. He developed an ileus on 7/5, warfarin was held, and heparin bridge started once INR fell below 1.8. Yesterday heparin was stopped with INR 2.19 and pharmacy asked to take over warfarin dosing. INR therapeutic today at 2.09. CBC stable s/p transfusions 7/1 and 7/9. LDH ok. No bleeding. Amiodarone change to PO. Appetite improving and weaning TPN to off today.  Goal of Therapy:  2-3 Monitor platelets by anticoagulation protocol: Yes   Plan:  1) Warfarin 7.5mg  tonight 2) Daily INR, CBC, LDH  Fredrik RiggerMarkle, Geniya Fulgham Sue 04/30/2017,1:49 PM

## 2017-04-30 NOTE — Progress Notes (Signed)
CSW met at bedside with patient and wife. Patient reports continued improvement since set back last week. Patient states he was able to eat small amount and ambulated the hallway a short distance. Patient's wife very supportive and at bedside. Patient denies any concerns at the moment and hopeful for continued improvement. CSW continues to follow for supportive needs. Raquel Sarna, [place], [place]

## 2017-04-30 NOTE — Progress Notes (Signed)
Patient ID: James [name], male   DOB: 1961-04-30, 56 y.o.   MRN: [id number] EVENING ROUNDS NOTE :     [address]       [place] [id number]             [phone]                 13 Days Post-Op Procedure(s) (LRB): MEDIASTINAL EXPLORATION (N/A)  Total Length of Stay:  LOS: 19 days  BP 110/83   Pulse 88   Temp 98.8 F (37.1 C) (Oral)   Resp (!) 31   Ht 6' (1.829 m)   Wt 226 lb (102.5 kg)   SpO2 100%   BMI 30.65 kg/m   .Intake/Output      07/11 0701 - 07/12 0700   P.O. 480   I.V. (mL/kg) 545.3 (5.3)   IV Piggyback 50   Total Intake(mL/kg) 1075.3 (10.5)   Urine (mL/kg/hr) 300 (0.2)   Stool    Total Output 300   Net +775.3         . sodium chloride Stopped (04/19/17 1200)  . sodium chloride Stopped (04/21/17 1500)  . lactated ringers    . lactated ringers 20 mL/hr at 04/29/17 2000  . milrinone 0.125 mcg/kg/min (04/30/17 0801)  . piperacillin-tazobactam (ZOSYN)  IV 3.375 g (04/30/17 1923)     Lab Results  Component Value Date   WBC 11.5 (H) 04/30/2017   HGB 8.4 (L) 04/30/2017   HCT 25.1 (L) 04/30/2017   PLT 282 04/30/2017   GLUCOSE 144 (H) 04/30/2017   CHOL 79 04/12/2017   TRIG 38 04/28/2017   HDL 23 (L) 04/12/2017   LDLCALC 43 04/12/2017   ALT 8 (L) 04/28/2017   AST 16 04/28/2017   NA 129 (L) 04/30/2017   K 3.8 04/30/2017   CL 99 (L) 04/30/2017   CREATININE 3.00 (H) 04/30/2017   BUN 88 (H) 04/30/2017   CO2 21 (L) 04/30/2017   TSH 4.928 (H) 04/12/2017   PSA 0.17 03/03/2017   INR 2.09 04/30/2017   HGBA1C 5.9 (H) 03/22/2017  awake and alert walked in unit today Taking small amounts of solid food no nausea    James OvensEdward B Tennyson Kallen MD  Beeper [phone] Office [phone] 04/30/2017 7:48 PM

## 2017-05-01 ENCOUNTER — Inpatient Hospital Stay (HOSPITAL_COMMUNITY): Payer: Medicare PPO

## 2017-05-01 DIAGNOSIS — I5023 Acute on chronic systolic (congestive) heart failure: Secondary | ICD-10-CM

## 2017-05-01 LAB — GLUCOSE, CAPILLARY
GLUCOSE-CAPILLARY: 137 mg/dL — AB (ref 65–99)
GLUCOSE-CAPILLARY: 165 mg/dL — AB (ref 65–99)
Glucose-Capillary: 151 mg/dL — ABNORMAL HIGH (ref 65–99)
Glucose-Capillary: 197 mg/dL — ABNORMAL HIGH (ref 65–99)
Glucose-Capillary: 166 mg/dL — ABNORMAL HIGH (ref 65–99)
Glucose-Capillary: 156 mg/dL — ABNORMAL HIGH (ref 65–99)

## 2017-05-01 LAB — CBC
HCT: 22.7 % — ABNORMAL LOW (ref 39.0–52.0)
Hemoglobin: 7.7 g/dL — ABNORMAL LOW (ref 13.0–17.0)
MCH: 29.7 pg (ref 26.0–34.0)
MCHC: 33.9 g/dL (ref 30.0–36.0)
MCV: 87.6 fL (ref 78.0–100.0)
PLATELETS: 284 10*3/uL (ref 150–400)
RBC: 2.59 MIL/uL — ABNORMAL LOW (ref 4.22–5.81)
RDW: 19.2 % — AB (ref 11.5–15.5)
WBC: 9.8 10*3/uL (ref 4.0–10.5)

## 2017-05-01 LAB — BASIC METABOLIC PANEL
Anion gap: 9 (ref 5–15)
BUN: 91 mg/dL — ABNORMAL HIGH (ref 6–20)
CO2: 21 mmol/L — ABNORMAL LOW (ref 22–32)
Calcium: 7.7 mg/dL — ABNORMAL LOW (ref 8.9–10.3)
Chloride: 101 mmol/L (ref 101–111)
Creatinine, Ser: 2.59 mg/dL — ABNORMAL HIGH (ref 0.61–1.24)
GFR calc Af Amer: 31 mL/min — ABNORMAL LOW (ref >60)
GFR calc non Af Amer: 26 mL/min — ABNORMAL LOW (ref >60)
Glucose, Bld: 151 mg/dL — ABNORMAL HIGH (ref 65–99)
Potassium: 3.9 mmol/L (ref 3.5–5.1)
Sodium: 131 mmol/L — ABNORMAL LOW (ref 135–145)

## 2017-05-01 LAB — PROTIME-INR
INR: 2.28
PROTHROMBIN TIME: 25.5 s — AB (ref 11.4–15.2)

## 2017-05-01 LAB — COOXEMETRY PANEL
CARBOXYHEMOGLOBIN: 1.8 % — AB (ref 0.5–1.5)
METHEMOGLOBIN: 1 % (ref 0.0–1.5)
O2 SAT: 62.4 %
Total hemoglobin: 12 g/dL (ref 12.0–16.0)

## 2017-05-01 LAB — BRAIN NATRIURETIC PEPTIDE: B NATRIURETIC PEPTIDE 5: 743.4 pg/mL — AB (ref 0.0–100.0)

## 2017-05-01 LAB — LACTATE DEHYDROGENASE: LDH: 200 U/L — AB (ref 98–192)

## 2017-05-01 MED ORDER — ISOSORBIDE MONONITRATE ER 30 MG PO TB24
30.0000 mg | ORAL_TABLET | Freq: Every day | ORAL | Status: DC
  Administered 2017-05-01 – 2017-05-05 (×5): 30 mg via ORAL
  Filled 2017-05-01 (×5): qty 1

## 2017-05-01 MED ORDER — WARFARIN SODIUM 5 MG PO TABS: 5.0000 mg | ORAL_TABLET | Freq: Once | ORAL | Status: DC

## 2017-05-01 MED ORDER — INSULIN ASPART 100 UNIT/ML ~~LOC~~ SOLN
0.0000 [IU] | Freq: Three times a day (TID) | SUBCUTANEOUS | Status: DC
  Administered 2017-05-01 – 2017-05-02 (×5): 3 [IU] via SUBCUTANEOUS
  Administered 2017-05-03: 5 [IU] via SUBCUTANEOUS
  Administered 2017-05-03 – 2017-05-04 (×5): 3 [IU] via SUBCUTANEOUS
  Administered 2017-05-05: 5 [IU] via SUBCUTANEOUS
  Administered 2017-05-05 – 2017-05-06 (×3): 3 [IU] via SUBCUTANEOUS
  Administered 2017-05-06 (×2): 5 [IU] via SUBCUTANEOUS
  Administered 2017-05-07 (×2): 3 [IU] via SUBCUTANEOUS
  Administered 2017-05-07: 2 [IU] via SUBCUTANEOUS
  Administered 2017-05-08 – 2017-05-09 (×4): 3 [IU] via SUBCUTANEOUS
  Administered 2017-05-09: 2 [IU] via SUBCUTANEOUS
  Administered 2017-05-09 – 2017-05-12 (×4): 3 [IU] via SUBCUTANEOUS
  Administered 2017-05-13 (×2): 2 [IU] via SUBCUTANEOUS
  Administered 2017-05-14: 3 [IU] via SUBCUTANEOUS
  Administered 2017-05-15 (×2): 2 [IU] via SUBCUTANEOUS
  Administered 2017-05-16: 3 [IU] via SUBCUTANEOUS
  Administered 2017-05-17 – 2017-05-18 (×2): 2 [IU] via SUBCUTANEOUS
  Administered 2017-05-19 (×2): 3 [IU] via SUBCUTANEOUS
  Administered 2017-05-20 (×2): 5 [IU] via SUBCUTANEOUS
  Administered 2017-05-20: 8 [IU] via SUBCUTANEOUS
  Administered 2017-05-21: 3 [IU] via SUBCUTANEOUS
  Administered 2017-05-21 (×2): 5 [IU] via SUBCUTANEOUS
  Administered 2017-05-22: 3 [IU] via SUBCUTANEOUS
  Administered 2017-05-22: 5 [IU] via SUBCUTANEOUS
  Administered 2017-05-22: 2 [IU] via SUBCUTANEOUS
  Administered 2017-05-23: 5 [IU] via SUBCUTANEOUS
  Administered 2017-05-23 (×2): 2 [IU] via SUBCUTANEOUS
  Administered 2017-05-24: 5 [IU] via SUBCUTANEOUS
  Administered 2017-05-24 – 2017-05-25 (×4): 2 [IU] via SUBCUTANEOUS
  Administered 2017-05-26: 3 [IU] via SUBCUTANEOUS
  Administered 2017-05-27: 2 [IU] via SUBCUTANEOUS

## 2017-05-01 MED ORDER — HYDRALAZINE HCL 25 MG PO TABS
25.0000 mg | ORAL_TABLET | Freq: Three times a day (TID) | ORAL | Status: DC
  Administered 2017-05-01 – 2017-05-05 (×12): 25 mg via ORAL
  Filled 2017-05-01 (×12): qty 1

## 2017-05-01 MED ORDER — SODIUM CHLORIDE 0.9 % IV SOLN
510.0000 mg | Freq: Once | INTRAVENOUS | Status: AC
  Administered 2017-05-01: 510 mg via INTRAVENOUS
  Filled 2017-05-01: qty 17

## 2017-05-01 MED ORDER — WARFARIN SODIUM 7.5 MG PO TABS
7.5000 mg | ORAL_TABLET | Freq: Once | ORAL | Status: AC
  Administered 2017-05-01: 7.5 mg via ORAL
  Filled 2017-05-01: qty 1

## 2017-05-01 NOTE — Progress Notes (Signed)
Chaplain stopped in for a follow-up visit while rounding the floor. Patient appreciative of Chaplain support since his family is unable to visit at this time.  Patient is hopeful about his current progress and hopes to continue to get better.  Chaplain will continue to check in regularly and be available as needed.  .   05/01/17 [id number]  Clinical Encounter Type  Visited With Patient  Visit Type Follow-up;Psychological support;Spiritual support;Social support  Referral From Chaplain  Consult/Referral To E. I. du PontChaplain

## 2017-05-01 NOTE — Progress Notes (Signed)
LVAD Inpatient Coordinator Rounding Note:  Admitted 03/07/17 due to A/C Heart failure.   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 Laneta SimmersBartle as DT VAD.  Vital signs: HR: 96 Doppler Pressure: 86 BP: 96/81 (88) O2 Sat:  99 on Room Air Wt:207>209 > 213 >213 > 208>203>220>227lbs>226>225   LVAD interrogation reveals:  Speed:  2820 Flow:  5.5 Power:  4.9 Alarms:none Peak: 8 Trough: 4 HCT: 23 Low flow alarm setting: 3.2 High watt alarm setting:   7.0  Suction:  Turned on Lavare cycle:  on  Blood Products: 6/28> 5 PRBC's, 6 FFP 7/1> 1 PRBC 7/9> 1 PRBC  Gtts: Milrinone restarted 7/8 currently @ 0.125 mcg/kg/min Levo stopped 04/26/17 Amiodarone stopped 04/29/17 Heparin stopped 04/29/17  TPN - started 04/24/17 for nutritional support  Arrhythmia: 04/24/17 - Afib with RVR - started amiodarone  Respiratory: 04/23/17 - re-intubated due to respiratory failure secondary to suspected aspiration pneumonia 04/25/17-extubated  Drive Line: Daily Dressing Kits with Aquacell AG silver strips per protocol.    Labs:  LDH trend:160 (pre VAD)>227>215>191>212>207>222>217>219>163>207>217>200  INR trend: 1.31>1.45>1.44>1.25>1.33>1.68>2.61>2.4>1.81>2.19>2.09>2.28  Anticoagulation Plan: -INR Goal: 2-2.5 started 04/19/17 -ASA Dose: 325 mg  Adverse Events on VAD: -Return to OR 6/28  with high chest tube output, evacuation of mediastinal hematoma -Ileus with vomiting on 04/23/17; re-intubation for acute respiratory failure;  probable aspiration pneumonia    Plan/Recommendations:  1. Wife is not coming today per pt. Nurse informed to page VAD Coordinator when doing dressing change, so that I can ensure sutures have been removed.  2. Continue daily dressing changes.   Carlton AdamSarah Herbert RN, VAD Coordinator 24/7 pager [phone]

## 2017-05-01 NOTE — Progress Notes (Signed)
CSW met with patient at bedside. Patient continues to improve and states he is feeling better. Patient was sitting up in chair and reports he had been up for sometime and even ambulated the halls. Patient appears to be motivated and in good spirits. CSW will continue to follow for supportive needs. Raquel Sarna, [place], [place]

## 2017-05-01 NOTE — Progress Notes (Signed)
Patient ID: James [name], male   DOB: 06-Jul-1961, 56 y.o.   MRN: [id number] TCTS DAILY ICU PROGRESS NOTE                   [address]            [place] [address]          [phone]   14 Days Post-Op Procedure(s) (LRB): MEDIASTINAL EXPLORATION (N/A) Procedure:  1. Insertion of right femoral arterial line 2. Redo Median Sternotomy 3. Extracorporeal circulation 4.   Implantation of Heartware HVAD Left Ventricular Assist Device.   Total Length of Stay:  LOS: 20 days   Subjective: Feels better, scared to eqat but taking po ok, bowel moved   Objective: Vital signs in last 24 hours: Temp:  [97.5 F (36.4 C)-98.8 F (37.1 C)] 97.9 F (36.6 C) (07/12 0700) Pulse Rate:  [36-175] 103 (07/12 0800) Cardiac Rhythm: Sinus tachycardia;Heart block (07/12 0600) Resp:  [24-37] 24 (07/12 0800) BP: (84-121)/(68-97) 97/86 (07/12 0800) SpO2:  [96 %-100 %] 96 % (07/12 0800) Weight:  [225 lb 14.4 oz (102.5 kg)] 225 lb 14.4 oz (102.5 kg) (07/12 0530)  Filed Weights   04/29/17 0359 04/30/17 0600 05/01/17 0530  Weight: 227 lb 4.7 oz (103.1 kg) 226 lb (102.5 kg) 225 lb 14.4 oz (102.5 kg)    Weight change: -1.6 oz (-0.045 kg)   Hemodynamic parameters for last 24 hours: CVP:  [4 mmHg-12 mmHg] 8 mmHg  Intake/Output from previous day: 07/11 0701 - 07/12 0700 In: 1629.7 [P.O.:830; I.V.:604.7; IV Piggyback:150] Out: 1100 [Urine:1100]  Intake/Output this shift: Total I/O In: 23.8 [I.V.:23.8] Out: -   Current Meds: Scheduled Meds: . amiodarone  200 mg Oral BID  . aspirin EC  325 mg Oral Daily   Or  . aspirin  324 mg Per Tube Daily   Or  . aspirin  300 mg Rectal Daily  . Chlorhexidine Gluconate Cloth  6 each Topical Daily  . famotidine  20 mg Oral QHS  . febuxostat  40 mg Oral Daily  . fentaNYL (SUBLIMAZE) injection  50 mcg Intravenous Once  . hydrALAZINE  25 mg Oral Q8H  . insulin aspart  0-15 Units Subcutaneous TID WC  . isosorbide mononitrate  30 mg Oral  Daily  . sodium chloride flush  10-40 mL Intracatheter Q12H  . sodium chloride flush  3 mL Intravenous Q12H  . torsemide  40 mg Oral Daily  . Warfarin - Pharmacist Dosing Inpatient   Does not apply q1800   Continuous Infusions: . sodium chloride Stopped (04/19/17 1200)  . sodium chloride Stopped (04/21/17 1500)  . ferumoxytol    . lactated ringers    . lactated ringers Stopped (05/01/17 0810)  . milrinone 0.125 mcg/kg/min (05/01/17 0800)  . piperacillin-tazobactam (ZOSYN)  IV Stopped (05/01/17 0735)   PRN Meds:.albuterol, docusate, hydrALAZINE, neomycin-bacitracin-polymyxin, ondansetron (ZOFRAN) IV, oxyCODONE, phenol, sodium chloride flush, sodium chloride flush, traMADol  General appearance: alert and cooperative Neurologic: intact Heart: regular rate and rhythm, S1, S2 normal, no murmur, click, rub or gallop and vad hum Lungs: diminished breath sounds bibasilar Abdomen: soft, non-tender; bowel sounds normal; no masses,  no organomegaly Extremities: extremities normal, atraumatic, no cyanosis or edema and Homans sign is negative, no sign of DVT Wound: intact no drainage   Lab Results: CBC: Recent Labs  04/30/17 0315 05/01/17 0323  WBC 11.5* 9.8  HGB 8.4* 7.7*  HCT 25.1* 22.7*  PLT 282 284   BMET:  Recent  Labs  04/30/17 0315 05/01/17 0323  NA 129* 131*  K 3.8 3.9  CL 99* 101  CO2 21* 21*  GLUCOSE 144* 151*  BUN 88* 91*  CREATININE 3.00* 2.59*  CALCIUM 7.8* 7.7*    CMET: Lab Results  Component Value Date   WBC 9.8 05/01/2017   HGB 7.7 (L) 05/01/2017   HCT 22.7 (L) 05/01/2017   PLT 284 05/01/2017   GLUCOSE 151 (H) 05/01/2017   CHOL 79 04/12/2017   TRIG 38 04/28/2017   HDL 23 (L) 04/12/2017   LDLCALC 43 04/12/2017   ALT 8 (L) 04/28/2017   AST 16 04/28/2017   NA 131 (L) 05/01/2017   K 3.9 05/01/2017   CL 101 05/01/2017   CREATININE 2.59 (H) 05/01/2017   BUN 91 (H) 05/01/2017   CO2 21 (L) 05/01/2017   TSH 4.928 (H) 04/12/2017   PSA 0.17 03/03/2017    INR 2.28 05/01/2017   HGBA1C 5.9 (H) Jan 26, 2017      PT/INR:  Recent Labs  05/01/17 0323  LABPROT 25.5*  INR 2.28   Radiology: Dg Chest Port 1 View  Result Date: 05/01/2017 CLINICAL DATA:  Left ventricular assist device EXAM: PORTABLE CHEST 1 VIEW COMPARISON:  04/29/2017 chest radiograph. FINDINGS: Stable configuration of single lead left subclavian pacemaker, left ventricular assist device and intact sternotomy wires. Right PICC terminates at the cavoatrial junction. Stable cardiomediastinal silhouette with mild cardiomegaly. No pneumothorax. No pleural effusion. Low lung volumes. Mild pulmonary edema, decreased. Stable tiny apical right lung granuloma. IMPRESSION: 1. Well-positioned support structures . 2. Mild congestive heart failure, improved . 3. Low lung volumes. Electronically Signed   By: Delbert PhenixJason A Poff M.D.   On: 05/01/2017 08:04   Chronic Kidney Disease   Stage I     GFR >90  Stage II    GFR 60-89  Stage IIIA GFR 45-59  Stage IIIB GFR 30-44  Stage IV   GFR 15-29  Stage V    GFR  <15  Lab Results  Component Value Date   CREATININE 2.59 (H) 05/01/2017   Estimated Creatinine Clearance: 40.4 mL/min (A) (by C-G formula based on SCr of 2.59 mg/dL (H)).   Assessment/Plan: S/P Procedure(s) (LRB): MEDIASTINAL EXPLORATION (N/A) Procedure:  Insertion of right femoral arterial line Redo Median Sternotomy Extracorporeal circulation   Implantation of Heartware HVAD Left Ventricular Assist Device.  Last dose  iv antibiotics tonight  renal function stable to slightly improved  inr 2.2  Delight Ovensdward B Harwood Nall 05/01/2017 8:35 AM

## 2017-05-01 NOTE — Progress Notes (Signed)
Occupational Therapy Treatment Patient Details Name: James [name] MRN: [id number] DOB: [birth year] Today's Date: 05/01/2017    History of present illness  Pt adm with acute on chronic heart failure and underwent Heartware HVAD implant on 6/28. Returned to OR later that day for evacuation of mediastinal hematoma. On 7/4 developed ileus with likely aspiration and intubated 7/4-7/6. PMH - gout, DMII, HTN, CKD, CAD S/P CABGx6 2012, chronic systolic heart failure and St jude ICD. presents now s/p heartware HVAD implant   OT comments  Pt is making great progress.  He is now able to begin to engage in ADL tasks besides self feeding and simple grooming.  He ambulated 26195ft with min A +2 for safety, and was better able to manage HVAD equipment, requiring supervision to switch from battery to A/C.   He is much brighter today, and more interactive - beginning to direct his care.   Follow Up Recommendations  CIR;Supervision/Assistance - 24 hour    Equipment Recommendations  3 in 1 bedside commode    Recommendations for Other Services Rehab consult    Precautions / Restrictions Precautions Precautions: Sternal Precaution Comments: Heartware HVAD, external pacer wires, A-line Restrictions Other Position/Activity Restrictions: Sternal precautions       Mobility Bed Mobility Overal bed mobility: Needs Assistance Bed Mobility: Supine to Sit     Supine to sit: Min assist;+2 for physical assistance     General bed mobility comments: assist to move LEs off bed and to lift trunk from bed   Transfers Overall transfer level: Needs assistance Equipment used: Rolling walker (2 wheeled) Transfers: Sit to/from [employer]Stand;Stand Pivot Transfers Sit to Stand: Mod assist;+2 physical assistance Stand pivot transfers: +2 safety/equipment;Min assist       General transfer comment: assist to power up into standing     Balance Overall balance assessment: Needs assistance Sitting-balance support: No upper  extremity supported;Feet supported Sitting balance-Leahy Scale: Fair Sitting balance - Comments: while in chair, pt able to lean forward toward feet    Standing balance support: Bilateral upper extremity supported Standing balance-Leahy Scale: Poor Standing balance comment: min A without UE support, or min guard with bil. Ue support                            ADL either performed or assessed with clinical judgement   ADL Overall ADL's : Needs assistance/impaired                     Lower Body Dressing: Maximal assistance;Sit to/from stand Lower Body Dressing Details (indicate cue type and reason): Pt is now able to push socks over heels  Toilet Transfer: Minimal assistance;+2 for safety/equipment;Ambulation;BSC;RW   Toileting- Clothing Manipulation and Hygiene: Maximal assistance;Sit to/from stand       Functional mobility during ADLs: Minimal assistance;+2 for safety/equipment;Rolling walker       Vision       Perception     Praxis      Cognition Arousal/Alertness: Awake/alert Behavior During Therapy: WFL for tasks assessed/performed Overall Cognitive Status: Within Functional Limits for tasks assessed                                 General Comments: Pt the most interactive I've seen him        Exercises     Shoulder Instructions       General Comments Pt required  min A to lift HVAD bag onto table, but was able to switch from battery to A/C with supervision today     Pertinent Vitals/ Pain       Pain Assessment: Faces Faces Pain Scale: Hurts a little bit Pain Location: sternum with transitional movements Pain Descriptors / Indicators: Discomfort;Sore Pain Intervention(s): Monitored during session  Home Living                                          Prior Functioning/Environment              Frequency  Min 2X/week        Progress Toward Goals  OT Goals(current goals can now be found in  the care plan section)  Progress towards OT goals: Progressing toward goals     Plan Discharge plan remains appropriate    Co-evaluation                 AM-PAC PT "6 Clicks" Daily Activity     Outcome Measure   Help from another person eating meals?: A Little Help from another person taking care of personal grooming?: A Lot Help from another person toileting, which includes using toliet, bedpan, or urinal?: A Lot Help from another person bathing (including washing, rinsing, drying)?: A Lot Help from another person to put on and taking off regular upper body clothing?: A Lot Help from another person to put on and taking off regular lower body clothing?: A Lot 6 Click Score: 13    End of Session    OT Visit Diagnosis: Unsteadiness on feet (R26.81)   Activity Tolerance Patient tolerated treatment well   Patient Left in chair;with call bell/phone within reach   Nurse Communication Mobility status        Time: 1610-9604 OT Time Calculation (min): 43 min  Charges: OT General Charges $OT Visit: 1 Procedure OT Treatments $Self Care/Home Management : 8-22 mins $Therapeutic Activity: 23-37 mins  [employer], OTR/L [phone]    Jeani Hawking M 05/01/2017, 4:15 PM

## 2017-05-01 NOTE — Progress Notes (Signed)
ANTICOAGULATION CONSULT NOTE - Follow Up Consult  Pharmacy Consult for Warfarin Indication: HVAD  No Active Allergies  Patient Measurements: Height: 6' (182.9 cm) Weight: 225 lb 14.4 oz (102.5 kg) IBW/kg (Calculated) : 77.6  Vital Signs: Temp: 97.9 F (36.6 C) (07/12 0700) Temp Source: Oral (07/12 0700) BP: 88/66 (07/12 0900) Pulse Rate: 95 (07/12 0900)  Labs:  Recent Labs  04/28/17 1310 04/28/17 2328  04/29/17 0320 04/29/17 0600 04/30/17 0315 05/01/17 0323  HGB  --   --   < > 8.1*  --  8.4* 7.7*  HCT  --   --   --  26.1*  --  25.1* 22.7*  PLT  --   --   --  295  --  282 284  LABPROT  --   --   --  24.7*  --  23.8* 25.5*  INR  --   --   --  2.19  --  2.09 2.28  HEPARINUNFRC 0.23* 0.20*  --   --   --   --   --   CREATININE  --   --   --   --  3.16* 3.00* 2.59*  < > = values in this interval not displayed.  Estimated Creatinine Clearance: 40.4 mL/min (A) (by C-G formula based on SCr of 2.59 mg/dL (H)).   Assessment: 56yom s/p HVAD 6/28, started on coumadin per MD 6/30. He developed an ileus on 7/5, warfarin was held, and heparin bridge started once INR fell below 1.8.  Heparin stopped 7/10 with INR 2.19 and pharmacy asked to take over warfarin dosing. INR therapeutic today at 2.28. Hgb down to 7.7 s/p transfusions 7/1 and 7/9, giving feraheme today. LDH ok. No bleeding. Amiodarone changed to PO. Appetite improved, off TPN.  Goal of Therapy:  INR goal 2-3 Monitor platelets by anticoagulation protocol: Yes   Plan:  1) Warfarin 7.5mg  tonight 2) Daily INR, CBC, LDH  Fredrik RiggerMarkle, Haili Donofrio Sue 05/01/2017,10:31 AM

## 2017-05-01 NOTE — Progress Notes (Signed)
Patient ID: James [name], male   DOB: [birth year], 56 y.o.   MRN: [id number]   Advanced Heart Failure VAD Team Note  Subjective:    HVAD placed 6/28.  Returned to the OR that evening with high chest tube output, evacuation of mediastinal hematoma.   Extubated 6/29. Milrinone stopped on 7/4.  Evening of 7/4, patient developed ileus with respiratory compromise. NGT placed with 3 L suctioned out.  CXR with suspicion for aspiration PNA.  Patient had to be intubated.  He went into atrial fibrillation with RVR.  He became hypotensive and was started on norepinephrine and phenylephrine.  Amiodarone gtt begun.     Extubated again on 7/6.   Continues on milrinone 0.125 mcg. Todays Co-ox 62%. Creatinine trending down 3> 2.59. Contiues to work with PT. Had BM 7/11.   Denies SOB.   HVAD INTERROGATION:  HVAD:  Flow 5.1 liters/min, speed 2820, power4.9 W,  Peak 7.6 Trough 3.4  Suction on. Lavare On  Objective:    Vital Signs:   Temp:  [97.5 F (36.4 C)-99 F (37.2 C)] 98.7 F (37.1 C) (07/12 0400) Pulse Rate:  [36-175] 101 (07/12 0700) Resp:  [24-37] 27 (07/12 0700) BP: (84-121)/(68-97) 92/78 (07/12 0700) SpO2:  [96 %-100 %] 97 % (07/12 0700) Weight:  [102.5 kg (225 lb 14.4 oz)] 102.5 kg (225 lb 14.4 oz) (07/12 0530) Last BM Date: 04/30/17 Mean arterial Pressure 80-90s  Intake/Output:   Intake/Output Summary (Last 24 hours) at 05/01/17 0753 Last data filed at 05/01/17 0700  Gross per 24 hour  Intake           1626.2 ml  Output             1100 ml  Net            526.2 ml     Physical Exam    Physical Exam: CVP 7 GENERAL: Well appearing, male. Sitting in the chair. NAD. HEENT: normal  NECK: Supple, JVP 7-8 .  2+ bilaterally, no bruits.  No lymphadenopathy or thyromegaly appreciated.  CARDIAC:  Mechanical heart sounds with LVAD hum present.  LUNGS:  Clear to auscultation bilaterally on room air. ABDOMEN:  Soft, round, nontender, positive bowel sounds x4.     LVAD exit site:   Dressing dry and intact.  No erythema or drainage.  Stabilization device present and accurately applied.  Driveline dressing is being changed daily per sterile technique. EXTREMITIES:  Warm and dry, no cyanosis, clubbing, rash. R and LLE unna boots. Edema noted above the knees. Fingers with gouty changes. RUE PICC NEUROLOGIC:  Alert and oriented x 4.  Gait steady.  No aphasia.  No dysarthria.  Affect pleasant.         Telemetry   A sensed V paced 80s Personally reviewed   Labs   Basic Metabolic Panel:  Recent Labs Lab 04/25/17 0452  04/26/17 0357 04/26/17 1739 04/27/17 0421 04/28/17 0334 04/29/17 0600 04/30/17 0315 05/01/17 0323  NA 131*  < > 132* 129* 130* 130* 128* 129* 131*  K 4.3  < > 4.0 3.5 4.0 3.7 3.9 3.8 3.9  CL 100*  < > 101 99* 99* 101 98* 99* 101  CO2 20*  < > 21* 19* 21* 20* 21* 21* 21*  GLUCOSE 211*  < > 170* 194* 162* 154* 165* 144* 151*  BUN 68*  < > 71* 72* 74* 80* 86* 88* 91*  CREATININE 3.00*  < > 3.26* 3.33* 3.20* 3.20* 3.16* 3.00* 2.59*  CALCIUM  8.1*  < > 7.9* 7.9* 8.1* 7.7* 7.7* 7.8* 7.7*  MG 1.9  --  2.0 2.1 1.9 2.1  --  1.9  --   PHOS 5.1*  --  4.8*  --  3.9 3.0  --   --   --   < > = values in this interval not displayed.  Liver Function Tests:  Recent Labs Lab 04/25/17 0452 04/26/17 0357 04/28/17 0334  AST 23 18 16   ALT 8* 8* 8*  ALKPHOS 95 78 104  BILITOT 1.3* 1.1 1.0  PROT 5.7* 5.2* 4.9*  ALBUMIN 2.6* 2.3* 2.2*   No results for input(s): LIPASE, AMYLASE in the last 168 hours. No results for input(s): AMMONIA in the last 168 hours.  CBC:  Recent Labs Lab 04/25/17 0452  04/27/17 0421 04/28/17 0334 04/29/17 0320 04/30/17 0315 05/01/17 0323  WBC 20.4*  < > 17.6* 17.5* 16.4* 11.5* 9.8  NEUTROABS 18.6*  --   --  14.7*  --   --   --   HGB 11.0*  < > 8.6* 7.8* 8.1* 8.4* 7.7*  HCT 33.4*  < > 26.6* 23.6* 26.1* 25.1* 22.7*  MCV 91.0  < > 92.4 87.7 96.0 88.1 87.6  PLT 380  < > 327 322 295 282 284  < > = values in this interval not  displayed.  INR:  Recent Labs Lab 04/27/17 0421 04/28/17 0334 04/29/17 0320 04/30/17 0315 05/01/17 0323  INR 1.80 1.81 2.19 2.09 2.28    Other results:     Imaging   No results found.   Medications:     Scheduled Medications: . amiodarone  200 mg Oral BID  . aspirin EC  325 mg Oral Daily   Or  . aspirin  324 mg Per Tube Daily   Or  . aspirin  300 mg Rectal Daily  . Chlorhexidine Gluconate Cloth  6 each Topical Daily  . famotidine  20 mg Oral QHS  . febuxostat  40 mg Oral Daily  . fentaNYL (SUBLIMAZE) injection  50 mcg Intravenous Once  . hydrALAZINE  10 mg Oral Q8H  . insulin aspart  0-24 Units Subcutaneous Q4H  . isosorbide mononitrate  15 mg Oral Daily  . sodium chloride flush  10-40 mL Intracatheter Q12H  . sodium chloride flush  3 mL Intravenous Q12H  . torsemide  40 mg Oral Daily  . Warfarin - Pharmacist Dosing Inpatient   Does not apply q1800    Infusions: . sodium chloride Stopped (04/19/17 1200)  . sodium chloride Stopped (04/21/17 1500)  . lactated ringers    . lactated ringers 20 mL/hr at 04/29/17 2000  . milrinone 0.125 mcg/kg/min (05/01/17 0700)  . piperacillin-tazobactam (ZOSYN)  IV Stopped (05/01/17 0735)    PRN Medications: albuterol, docusate, hydrALAZINE, neomycin-bacitracin-polymyxin, ondansetron (ZOFRAN) IV, oxyCODONE, phenol, sodium chloride flush, sodium chloride flush, traMADol   Patient Profile   56 yo with CAD s/p CABG, ischemic cardiomyopathy/chronic systolic CHF, tophaceous gout, and CKD stage 3 was admitted for diuresis and consideration for LVAD placement. S/p HVAD on 6/28  Assessment/Plan:    1. Acute/chronic systolic CHF: Ischemic cardiomyopathy.  St Jude ICD.  Echo (6/18) with EF 15%, mildly dilated RV with moderately decreased systolic function.  RHC 6/22 with elevated filling pressures and evidence of RV failure though PAPi score 1.95 so not prohibitive.  [facility] with  severe tophaceous gout and concern for worsening on transplant meds.  He was diuresed extensively,  repeat RHC 6/26 with marked improvement in filling pressures and stable cardiac output on milrinone.  HVAD placement 6/28 and had to return to OR to evacuate mediastinal hematoma.  He had been weaned off pressors and milrinone, but developed ileus and likely aspiration event 7/4, re-intubated, developed afib/RVR, and now back on norepinephrine (phenylephrine stopped).  On 7/6, he was extubated.  Todays CO-OX 62%. Continue milrinone 0.125 mcg.   Continue torsemide 40 mg daily  Maps trending up. Increase hydralazine 25 tid and and increased imdur to 30 mg daily.  Continue ASA 325 mg daily. INR 2.28.  Continue coumadin. Discussed with pharmacy.  - No dig/spir/arb with levated creatinine.    2. AKI on CKD stage 3: Suspect a component of peri-op ATN, worsened with ileus, vomiting, intubation/sedation.   -Creatinine coming down to 2.59.  Keep Map >70 and <90 3. Anemia: Chronic, seen by GI pre-op, they did not feel that he needed endoscopy.  FOBT negative initially. He had 1 unit PRBCs 7/1 and 7/9 with appropriate bump.Today hgb down to 7.7 .  Received feraheme 6/25. Give another dose of feraheme today.  CBC in am.  4. CAD: s/p CABG 2012.   - Stable.   5. Gout: Severe tophaceous gout. No complaint of gouty pain today.  - Continue uloric. Holding colchicine with AKI.     6. Atrial fibrillation: Developed atrial fibrillation with RVR in setting of aspiration PNA on 7/4.   Continue amio 200 mg a day.   - On coumadin. INR 2.28  7. Malnutrition: Major issue with baseline poor nutritional status and now with ileus.  Albumin 2.2  Nutrition following, now getting TNA.  Advance diet to soft.  - 8. Aspiration PNA with acute hypoxemic respiratory failure on 7/4: He was extubated on 7/6.  -WBC continues to trend down. WBC down to 9.8   - Tracheal aspirate with Klebsiella and citrobacter. Both sensitive to Zosyn.  -  Completes Zosyn today.  9. Ileus: Vomiting on 7/4 with aspiration PNA.  Resolved.  -10. Deconditioning: - PT following. CIR following. Hopefully can go CIR next week.     I reviewed the HVAD parameters from today, and compared the results to the patient's prior recorded data.  No programming changes were made.  The HVAD is functioning within specified parameters.     Tonye Becket, NP 05/01/2017, 7:53 AM  VAD Team --- VAD ISSUES ONLY--- Pager [phone] (7am - 7am) Advanced Heart Failure Team  Pager [phone] (M-F; 7a - 4p)  Please contact [facility] for night-coverage after hours (4p -7a ) and weekends on [email or web address]  Patient seen and examined with Tonye Becket, NP. We discussed all aspects of the encounter. I agree with the assessment and plan as stated above.   Remains on milrinone for RV support. Remains weak but starting to get stronger. Volume status and renal function improving. Will continue po amio.   Ileus resolving will advance diet.   Continues on zosyn for aspiration PNA.   May need another unit RBCs  Will continue milrinone, diuresis and abx. Continue PT. Keep in ICU for now. VAD parameters look good.   Arvilla Meres, MD  10:01 AM

## 2017-05-02 ENCOUNTER — Inpatient Hospital Stay (HOSPITAL_COMMUNITY): Payer: Medicare PPO

## 2017-05-02 DIAGNOSIS — D649 Anemia, unspecified: Secondary | ICD-10-CM

## 2017-05-02 DIAGNOSIS — R040 Epistaxis: Secondary | ICD-10-CM

## 2017-05-02 DIAGNOSIS — R195 Other fecal abnormalities: Secondary | ICD-10-CM

## 2017-05-02 DIAGNOSIS — I472 Ventricular tachycardia: Secondary | ICD-10-CM

## 2017-05-02 LAB — GLUCOSE, CAPILLARY
GLUCOSE-CAPILLARY: 171 mg/dL — AB (ref 65–99)
GLUCOSE-CAPILLARY: 186 mg/dL — AB (ref 65–99)
Glucose-Capillary: 173 mg/dL — ABNORMAL HIGH (ref 65–99)
Glucose-Capillary: 159 mg/dL — ABNORMAL HIGH (ref 65–99)

## 2017-05-02 LAB — BASIC METABOLIC PANEL
Anion gap: 7 (ref 5–15)
BUN: 89 mg/dL — ABNORMAL HIGH (ref 6–20)
CO2: 21 mmol/L — ABNORMAL LOW (ref 22–32)
Calcium: 7.7 mg/dL — ABNORMAL LOW (ref 8.9–10.3)
Chloride: 105 mmol/L (ref 101–111)
Creatinine, Ser: 2.26 mg/dL — ABNORMAL HIGH (ref 0.61–1.24)
GFR calc Af Amer: 36 mL/min — ABNORMAL LOW (ref >60)
GFR calc non Af Amer: 31 mL/min — ABNORMAL LOW (ref >60)
Glucose, Bld: 134 mg/dL — ABNORMAL HIGH (ref 65–99)
Potassium: 3.8 mmol/L (ref 3.5–5.1)
Sodium: 133 mmol/L — ABNORMAL LOW (ref 135–145)

## 2017-05-02 LAB — POCT I-STAT, CHEM 8
BUN: 84 mg/dL — ABNORMAL HIGH (ref 6–20)
CREATININE: 1.8 mg/dL — AB (ref 0.61–1.24)
Calcium, Ion: 1.13 mmol/L — ABNORMAL LOW (ref 1.15–1.40)
Chloride: 101 mmol/L (ref 101–111)
Glucose, Bld: 230 mg/dL — ABNORMAL HIGH (ref 65–99)
HEMATOCRIT: 21 % — AB (ref 39.0–52.0)
HEMOGLOBIN: 7.1 g/dL — AB (ref 13.0–17.0)
POTASSIUM: 4.2 mmol/L (ref 3.5–5.1)
Sodium: 135 mmol/L (ref 135–145)
TCO2: 22 mmol/L (ref 0–100)

## 2017-05-02 LAB — COOXEMETRY PANEL
Carboxyhemoglobin: 1.5 % (ref 0.5–1.5)
METHEMOGLOBIN: 1.7 % — AB (ref 0.0–1.5)
O2 SAT: 60.8 %
TOTAL HEMOGLOBIN: 8.2 g/dL — AB (ref 12.0–16.0)

## 2017-05-02 LAB — BPAM RBC
Blood Product Expiration Date: 201807192359
Blood Product Expiration Date: 201807192359
ISSUE DATE / TIME: 201807091339
Unit Type and Rh: 6200
Unit Type and Rh: 6200

## 2017-05-02 LAB — CBC WITH DIFFERENTIAL/PLATELET
Basophils Absolute: 0 10*3/uL (ref 0.0–0.1)
Basophils Relative: 0 %
Eosinophils Absolute: 0.1 10*3/uL (ref 0.0–0.7)
Eosinophils Relative: 2 %
HEMATOCRIT: 21.7 % — AB (ref 39.0–52.0)
HEMOGLOBIN: 7.3 g/dL — AB (ref 13.0–17.0)
LYMPHS ABS: 1.1 10*3/uL (ref 0.7–4.0)
LYMPHS PCT: 12 %
MCH: 29.3 pg (ref 26.0–34.0)
MCHC: 33.6 g/dL (ref 30.0–36.0)
MCV: 87.1 fL (ref 78.0–100.0)
Monocytes Absolute: 0.5 10*3/uL (ref 0.1–1.0)
Monocytes Relative: 6 %
NEUTROS ABS: 7.2 10*3/uL (ref 1.7–7.7)
NEUTROS PCT: 80 %
Platelets: 213 10*3/uL (ref 150–400)
RBC: 2.49 MIL/uL — AB (ref 4.22–5.81)
RDW: 18.3 % — ABNORMAL HIGH (ref 11.5–15.5)
WBC: 8.9 10*3/uL (ref 4.0–10.5)

## 2017-05-02 LAB — PREPARE RBC (CROSSMATCH)

## 2017-05-02 LAB — TYPE AND SCREEN
ABO/RH(D): A POS
Antibody Screen: NEGATIVE
UNIT DIVISION: 0
Unit division: 0

## 2017-05-02 LAB — CBC
HCT: 18 % — ABNORMAL LOW (ref 39.0–52.0)
Hemoglobin: 5.9 g/dL — CL (ref 13.0–17.0)
MCH: 29.1 pg (ref 26.0–34.0)
MCHC: 32.8 g/dL (ref 30.0–36.0)
MCV: 88.7 fL (ref 78.0–100.0)
Platelets: 227 10*3/uL (ref 150–400)
RBC: 2.03 MIL/uL — AB (ref 4.22–5.81)
RDW: 20.1 % — AB (ref 11.5–15.5)
WBC: 7.5 10*3/uL (ref 4.0–10.5)

## 2017-05-02 LAB — LACTATE DEHYDROGENASE: LDH: 177 U/L (ref 98–192)

## 2017-05-02 LAB — PROTIME-INR
INR: 2.95
Prothrombin Time: 31.3 seconds — ABNORMAL HIGH (ref 11.4–15.2)

## 2017-05-02 LAB — OCCULT BLOOD X 1 CARD TO LAB, STOOL: FECAL OCCULT BLD: POSITIVE — AB

## 2017-05-02 MED ORDER — MIDAZOLAM HCL 2 MG/2ML IJ SOLN
INTRAMUSCULAR | Status: AC
  Filled 2017-05-02: qty 4

## 2017-05-02 MED ORDER — ADENOSINE 6 MG/2ML IV SOLN
12.0000 mg | Freq: Once | INTRAVENOUS | Status: AC
  Administered 2017-05-02: 12 mg via INTRAVENOUS

## 2017-05-02 MED ORDER — FENTANYL CITRATE (PF) 100 MCG/2ML IJ SOLN
INTRAMUSCULAR | Status: AC
  Filled 2017-05-02: qty 2

## 2017-05-02 MED ORDER — AMIODARONE HCL IN DEXTROSE 360-4.14 MG/200ML-% IV SOLN: 30.0000 mg/h | INTRAVENOUS | Status: DC

## 2017-05-02 MED ORDER — ENSURE ENLIVE PO LIQD
237.0000 mL | Freq: Two times a day (BID) | ORAL | Status: DC
  Administered 2017-05-04 – 2017-05-05 (×2): 237 mL via ORAL

## 2017-05-02 MED ORDER — AMIODARONE LOAD VIA INFUSION
150.0000 mg | Freq: Once | INTRAVENOUS | Status: AC
  Administered 2017-05-02: 150 mg via INTRAVENOUS

## 2017-05-02 MED ORDER — ADENOSINE 6 MG/2ML IV SOLN
INTRAVENOUS | Status: AC
  Filled 2017-05-02: qty 6

## 2017-05-02 MED ORDER — LIDOCAINE HCL (CARDIAC) 20 MG/ML IV SOLN
100.0000 mg | Freq: Once | INTRAVENOUS | Status: AC
  Administered 2017-05-02: 100 mg via INTRAVENOUS

## 2017-05-02 MED ORDER — AMIODARONE LOAD VIA INFUSION
150.0000 mg | Freq: Once | INTRAVENOUS | Status: AC
  Administered 2017-05-02: 150 mg via INTRAVENOUS
  Filled 2017-05-02: qty 83.34

## 2017-05-02 MED ORDER — SODIUM CHLORIDE 0.9 % IV SOLN
0.0000 ug/min | INTRAVENOUS | Status: DC
  Administered 2017-05-02: 20 ug/min via INTRAVENOUS
  Filled 2017-05-02: qty 2

## 2017-05-02 MED ORDER — MAGNESIUM SULFATE 2 GM/50ML IV SOLN
2.0000 g | Freq: Once | INTRAVENOUS | Status: AC
  Administered 2017-05-02: 2 g via INTRAVENOUS

## 2017-05-02 MED ORDER — PANTOPRAZOLE SODIUM 40 MG IV SOLR
40.0000 mg | Freq: Two times a day (BID) | INTRAVENOUS | Status: DC
  Administered 2017-05-02 – 2017-05-14 (×25): 40 mg via INTRAVENOUS
  Filled 2017-05-02 (×25): qty 40

## 2017-05-02 MED ORDER — MIDAZOLAM HCL 2 MG/2ML IJ SOLN
2.0000 mg | Freq: Once | INTRAMUSCULAR | Status: AC
  Administered 2017-05-02: 2 mg via INTRAVENOUS

## 2017-05-02 MED ORDER — SODIUM CHLORIDE 0.9 % IV SOLN: Freq: Once | INTRAVENOUS | Status: AC

## 2017-05-02 MED ORDER — SODIUM CHLORIDE 0.9 % IV SOLN: Freq: Once | INTRAVENOUS | Status: DC

## 2017-05-02 MED ORDER — MIDAZOLAM HCL 2 MG/2ML IJ SOLN
INTRAMUSCULAR | Status: AC
  Filled 2017-05-02: qty 2

## 2017-05-02 MED ORDER — AMIODARONE HCL IN DEXTROSE 360-4.14 MG/200ML-% IV SOLN
60.0000 mg/h | INTRAVENOUS | Status: DC
  Administered 2017-05-02 – 2017-05-04 (×6): 60 mg/h via INTRAVENOUS
  Filled 2017-05-02 (×7): qty 200

## 2017-05-02 MED ORDER — MIDAZOLAM HCL 2 MG/2ML IJ SOLN
4.0000 mg | Freq: Once | INTRAMUSCULAR | Status: AC
  Administered 2017-05-02: 4 mg via INTRAVENOUS

## 2017-05-02 MED ORDER — FENTANYL CITRATE (PF) 100 MCG/2ML IJ SOLN
100.0000 ug | Freq: Once | INTRAMUSCULAR | Status: AC
Start: 2017-05-02 — End: 2017-05-02

## 2017-05-02 MED ORDER — AMIODARONE HCL IN DEXTROSE 360-4.14 MG/200ML-% IV SOLN
60.0000 mg/h | INTRAVENOUS | Status: DC
  Administered 2017-05-02 (×3): 60 mg/h via INTRAVENOUS
  Filled 2017-05-02: qty 200

## 2017-05-02 MED ORDER — AMIODARONE LOAD VIA INFUSION
300.0000 mg | Freq: Once | INTRAVENOUS | Status: AC
  Administered 2017-05-02: 300 mg via INTRAVENOUS

## 2017-05-02 MED ORDER — ETOMIDATE 2 MG/ML IV SOLN
10.0000 mg | Freq: Once | INTRAVENOUS | Status: AC
  Administered 2017-05-02: 10 mg via INTRAVENOUS

## 2017-05-02 NOTE — Progress Notes (Signed)
Patient ID: James [name], male   DOB: May 09, 1961, 56 y.o.   MRN: [id number] TCTS DAILY ICU PROGRESS NOTE                   [address]            [place] [id number]          [phone]   15 Days Post-Op Procedure(s) (LRB): MEDIASTINAL EXPLORATION (N/A) Procedure: 1. Insertion of rightfemoral arterial line 2. Redo Median Sternotomy 3. Extracorporeal circulation 4. Implantation of Heartware HVADLeft Ventricular Assist Device.     Total Length of Stay:  LOS: 21 days   Subjective: Walking in hall some  Dark stools today, coumadin on hold  Gi has seen   Objective: Vital signs in last 24 hours: Temp:  [97.4 F (36.3 C)-98.2 F (36.8 C)] 98.2 F (36.8 C) (07/13 1145) Pulse Rate:  [28-100] 90 (07/13 1300) Cardiac Rhythm: Normal sinus rhythm;Heart block (07/13 0730) Resp:  [22-30] 22 (07/13 1300) BP: (87-104)/(69-87) 99/87 (07/13 1300) SpO2:  [98 %-100 %] 100 % (07/13 1300) Weight:  [224 lb 8 oz (101.8 kg)] 224 lb 8 oz (101.8 kg) (07/13 0500)  Filed Weights   04/30/17 0600 05/01/17 0530 05/02/17 0500  Weight: 226 lb (102.5 kg) 225 lb 14.4 oz (102.5 kg) 224 lb 8 oz (101.8 kg)    Weight change: -1 lb 6.4 oz (-0.635 kg)   Hemodynamic parameters for last 24 hours: CVP:  [5 mmHg-11 mmHg] 6 mmHg  Intake/Output from previous day: 07/12 0701 - 07/13 0700 In: 1018.2 [P.O.:600; I.V.:111.2; IV Piggyback:217] Out: 2175 [Urine:2175]  Intake/Output this shift: Total I/O In: 25 [I.V.:10; Blood:15] Out: 400 [Urine:400]  Current Meds: Scheduled Meds: . amiodarone  200 mg Oral BID  . Chlorhexidine Gluconate Cloth  6 each Topical Daily  . febuxostat  40 mg Oral Daily  . feeding supplement (ENSURE ENLIVE)  237 mL Oral BID BM  . fentaNYL (SUBLIMAZE) injection  50 mcg Intravenous Once  . hydrALAZINE  25 mg Oral Q8H  . insulin aspart  0-15 Units Subcutaneous TID WC  . isosorbide mononitrate  30 mg Oral Daily  . pantoprazole (PROTONIX) IV  40 mg Intravenous  Q12H  . sodium chloride flush  10-40 mL Intracatheter Q12H  . sodium chloride flush  3 mL Intravenous Q12H  . torsemide  40 mg Oral Daily   Continuous Infusions: . sodium chloride Stopped (04/19/17 1200)  . sodium chloride Stopped (04/21/17 1500)  . sodium chloride    . lactated ringers Stopped (05/01/17 0810)  . milrinone 0.125 mcg/kg/min (05/02/17 0700)   PRN Meds:.albuterol, docusate, hydrALAZINE, neomycin-bacitracin-polymyxin, ondansetron (ZOFRAN) IV, oxyCODONE, phenol, sodium chloride flush, sodium chloride flush, traMADol  General appearance: alert and cooperative Neurologic: intact Heart: regular rate and rhythm, S1, S2 normal, no murmur, click, rub or gallop Lungs: diminished breath sounds bibasilar Abdomen: soft, non-tender; bowel sounds normal; no masses,  no organomegaly Extremities: extremities normal, atraumatic, no cyanosis or edema and Homans sign is negative, no sign of DVT Wound: sternum intact  Lab Results: CBC: Recent Labs  05/01/17 0323 05/02/17 0346  WBC 9.8 7.5  HGB 7.7* 5.9*  HCT 22.7* 18.0*  PLT 284 227   BMET:  Recent Labs  05/01/17 0323 05/02/17 0346  NA 131* 133*  K 3.9 3.8  CL 101 105  CO2 21* 21*  GLUCOSE 151* 134*  BUN 91* 89*  CREATININE 2.59* 2.26*  CALCIUM 7.7* 7.7*    CMET: Lab Results  Component Value Date   WBC 7.5 05/02/2017   HGB 5.9 (LL) 05/02/2017   HCT 18.0 (L) 05/02/2017   PLT 227 05/02/2017   GLUCOSE 134 (H) 05/02/2017   CHOL 79 04/12/2017   TRIG 38 04/28/2017   HDL 23 (L) 04/12/2017   LDLCALC 43 04/12/2017   ALT 8 (L) 04/28/2017   AST 16 04/28/2017   NA 133 (L) 05/02/2017   K 3.8 05/02/2017   CL 105 05/02/2017   CREATININE 2.26 (H) 05/02/2017   BUN 89 (H) 05/02/2017   CO2 21 (L) 05/02/2017   TSH 4.928 (H) 04/12/2017   PSA 0.17 03/03/2017   INR 2.95 05/02/2017   HGBA1C 5.9 (H) 04/12/2017      PT/INR:  Recent Labs  05/02/17 0346  LABPROT 31.3*  INR 2.95   Radiology: Dg Chest Port 1  View  Result Date: 05/02/2017 CLINICAL DATA:  Chest pain EXAM: PORTABLE CHEST 1 VIEW COMPARISON:  May 01, 2017 FINDINGS: There is slight atelectasis in the bases. Lungs elsewhere are clear. Central catheter tip is in the superior vena cava. Pacemaker lead is attached to the right ventricle. There is a left ventricular assist device, incompletely visualized. There is cardiomegaly with pulmonary venous hypertension. No adenopathy. There is degenerative change in the left shoulder with superior migration of the left humeral head. IMPRESSION: Bibasilar atelectasis. No edema or consolidation. Stable cardiomegaly with pulmonary vascular congestion. No pneumothorax. There is superior migration of the left humeral head, a finding indicative of chronic rotator cuff tear. Electronically Signed   By: Bretta Bang III M.D.   On: 05/02/2017 08:17     Assessment/Plan: S/P Procedure(s) (LRB): MEDIASTINAL EXPLORATION (N/A) Insertion of rightfemoral arterial line Redo Median Sternotomy Extracorporeal circulation Implantation of Heartware HVADLeft Ventricular Assist Device.  now with GI bleed, gi seen , coumadin on hold    Total Length of Stay:  LOS: 20 days     [name] 05/02/2017 2:03 PM

## 2017-05-02 NOTE — Progress Notes (Signed)
Patient ID: KY RUMPLE, male   DOB: 02-09-1961, 56 y.o.   MRN: [id number]   Advanced Heart Failure VAD Team Note  Subjective:    HVAD placed 6/28.  Returned to the OR that evening with high chest tube output, evacuation of mediastinal hematoma.   Extubated 6/29. Milrinone stopped on 7/4.  Evening of 7/4, patient developed ileus with respiratory compromise. NGT placed with 3 L suctioned out.  CXR with suspicion for aspiration PNA.  Patient had to be intubated.  He went into atrial fibrillation with RVR.  He became hypotensive and was started on norepinephrine and phenylephrine.  Amiodarone gtt begun.     Extubated again on 7/6.   Continues on milrinone 0.125 mcg. CO-OX 61%. Had + FOBT. 2 bowel movements. Last BM with some blood. Hgb down 5.9. Nose bleed this morning.   Denies SOB. Frustrated about bloody stool.   HVAD INTERROGATION:  HVAD:  Flow 5.2 liters/min, speed 2820, power5 W,  Peak 7.5 Trough 3.3  Suction on. Lavare On  Objective:    Vital Signs:   Temp:  [97.4 F (36.3 C)-98.2 F (36.8 C)] 98.1 F (36.7 C) (07/13 0500) Pulse Rate:  [28-103] 93 (07/13 0700) Resp:  [23-33] 27 (07/13 0700) BP: (87-107)/(66-89) 87/74 (07/13 0700) SpO2:  [96 %-100 %] 100 % (07/13 0700) Weight:  [224 lb 8 oz (101.8 kg)] 224 lb 8 oz (101.8 kg) (07/13 0500) Last BM Date: 05/02/17 Mean arterial Pressure 80-90s  Intake/Output:   Intake/Output Summary (Last 24 hours) at 05/02/17 0751 Last data filed at 05/02/17 0700  Gross per 24 hour  Intake           1018.2 ml  Output             2175 ml  Net          -1156.8 ml     Physical Exam    Physical Exam: GENERAL: Appears fatigued.  NAD. In bed.   HEENT: normal  NECK: Supple, JVP  .  2+ bilaterally, no bruits.  No lymphadenopathy or thyromegaly appreciated.   CARDIAC:  Mechanical heart sounds with LVAD hum present.  LUNGS:  Clear to auscultation bilaterally.  ABDOMEN:  Soft, round, nontender, positive bowel sounds x4.     LVAD exit  site: well-healed and incorporated.  Dressing dry and intact.  No erythema or drainage.  Stabilization device present and accurately applied.  Driveline dressing is being changed daily per sterile technique. EXTREMITIES:  Warm and dry, no cyanosis, clubbing, rash or edema. RUE PICC   NEUROLOGIC:  Alert and oriented x 4.  Gait steady.  No aphasia.  No dysarthria.  Affect flat.         Telemetry   A sensed V paced 90s    Labs   Basic Metabolic Panel:  Recent Labs Lab 04/26/17 0357 04/26/17 1739 04/27/17 0421 04/28/17 0334 04/29/17 0600 04/30/17 0315 05/01/17 0323 05/02/17 0346  NA 132* 129* 130* 130* 128* 129* 131* 133*  K 4.0 3.5 4.0 3.7 3.9 3.8 3.9 3.8  CL 101 99* 99* 101 98* 99* 101 105  CO2 21* 19* 21* 20* 21* 21* 21* 21*  GLUCOSE 170* 194* 162* 154* 165* 144* 151* 134*  BUN 71* 72* 74* 80* 86* 88* 91* 89*  CREATININE 3.26* 3.33* 3.20* 3.20* 3.16* 3.00* 2.59* 2.26*  CALCIUM 7.9* 7.9* 8.1* 7.7* 7.7* 7.8* 7.7* 7.7*  MG 2.0 2.1 1.9 2.1  --  1.9  --   --   PHOS 4.8*  --  3.9 3.0  --   --   --   --     Liver Function Tests:  Recent Labs Lab 04/26/17 0357 04/28/17 0334  AST 18 16  ALT 8* 8*  ALKPHOS 78 104  BILITOT 1.1 1.0  PROT 5.2* 4.9*  ALBUMIN 2.3* 2.2*   No results for input(s): LIPASE, AMYLASE in the last 168 hours. No results for input(s): AMMONIA in the last 168 hours.  CBC:  Recent Labs Lab 04/28/17 0334 04/29/17 0320 04/30/17 0315 05/01/17 0323 05/02/17 0346  WBC 17.5* 16.4* 11.5* 9.8 7.5  NEUTROABS 14.7*  --   --   --   --   HGB 7.8* 8.1* 8.4* 7.7* 5.9*  HCT 23.6* 26.1* 25.1* 22.7* 18.0*  MCV 87.7 96.0 88.1 87.6 88.7  PLT 322 295 282 284 227    INR:  Recent Labs Lab 04/28/17 0334 04/29/17 0320 04/30/17 0315 05/01/17 0323 05/02/17 0346  INR 1.81 2.19 2.09 2.28 2.95    Other results:     Imaging   Dg Chest Port 1 View  Result Date: 05/01/2017 CLINICAL DATA:  Left ventricular assist device EXAM: PORTABLE CHEST 1 VIEW  COMPARISON:  04/29/2017 chest radiograph. FINDINGS: Stable configuration of single lead left subclavian pacemaker, left ventricular assist device and intact sternotomy wires. Right PICC terminates at the cavoatrial junction. Stable cardiomediastinal silhouette with mild cardiomegaly. No pneumothorax. No pleural effusion. Low lung volumes. Mild pulmonary edema, decreased. Stable tiny apical right lung granuloma. IMPRESSION: 1. Well-positioned support structures . 2. Mild congestive heart failure, improved . 3. Low lung volumes. Electronically Signed   By: Delbert Phenix M.D.   On: 05/01/2017 08:04     Medications:     Scheduled Medications: . amiodarone  200 mg Oral BID  . aspirin EC  325 mg Oral Daily   Or  . aspirin  324 mg Per Tube Daily   Or  . aspirin  300 mg Rectal Daily  . Chlorhexidine Gluconate Cloth  6 each Topical Daily  . famotidine  20 mg Oral QHS  . febuxostat  40 mg Oral Daily  . fentaNYL (SUBLIMAZE) injection  50 mcg Intravenous Once  . hydrALAZINE  25 mg Oral Q8H  . insulin aspart  0-15 Units Subcutaneous TID WC  . isosorbide mononitrate  30 mg Oral Daily  . sodium chloride flush  10-40 mL Intracatheter Q12H  . sodium chloride flush  3 mL Intravenous Q12H  . torsemide  40 mg Oral Daily  . Warfarin - Pharmacist Dosing Inpatient   Does not apply q1800    Infusions: . sodium chloride Stopped (04/19/17 1200)  . sodium chloride Stopped (04/21/17 1500)  . sodium chloride    . lactated ringers    . lactated ringers Stopped (05/01/17 0810)  . milrinone 0.125 mcg/kg/min (05/02/17 0700)    PRN Medications: albuterol, docusate, hydrALAZINE, neomycin-bacitracin-polymyxin, ondansetron (ZOFRAN) IV, oxyCODONE, phenol, sodium chloride flush, sodium chloride flush, traMADol   Patient Profile   56 yo with CAD s/p CABG, ischemic cardiomyopathy/chronic systolic CHF, tophaceous gout, and CKD stage 3 was admitted for diuresis and consideration for LVAD placement. S/p HVAD on  6/28  Assessment/Plan:    1. Acute/chronic systolic CHF: Ischemic cardiomyopathy.  St Jude ICD.  Echo (6/18) with EF 15%, mildly dilated RV with moderately decreased systolic function.  RHC 6/22 with elevated filling pressures and evidence of RV failure though PAPi score 1.95 so not prohibitive.  He has been seen at [facility] and turned down for transplant with severe  tophaceous gout and concern for worsening on transplant meds.  He was diuresed extensively, repeat RHC 6/26 with marked improvement in filling pressures and stable cardiac output on milrinone.  HVAD placement 6/28 and had to return to OR to evacuate mediastinal hematoma.  He had been weaned off pressors and milrinone, but developed ileus and likely aspiration event 7/4, re-intubated, developed afib/RVR, and now back on norepinephrine (phenylephrine stopped).  On 7/6, he was extubated.  Todays CO-OX is 61%. Continue milrinone 0.125 mcg.   Continue torsemide 40 mg daily.  . Maps 80s. . Continue hydralazine 25 tid and and imdur to 30 mg daily.  Stop ASA with GI bleed. INR 2.95.  Hold coumadin. Start heparin when  INR <1.8.   - No dig/spir/arb with elevated creatinine.    2. AKI on CKD stage 3: Suspect a component of peri-op ATN, worsened with ileus, vomiting, intubation/sedation.   -Creatinine trending down 2.26.  Keep Map >70 and <90 3. Anemia: Chronic, seen by GI pre-op, they did not feel that he needed endoscopy.  FOBT negative initially. He had 1 unit PRBCs 7/1 and 7/9.  Received feraheme 6/25 and 05/02/2017.  Todays Hgb down to 5.9. Type and cross. Give 2UPRBCs.  FOBT + . Consult GI. Hold coumadin. Start IV protonix twice a day.  4. CAD: s/p CABG 2012.   - Stable.   5. Gout: Severe tophaceous gout. No complaint of gouty pain today.  - Continue uloric. Holding colchicine with AKI.     6. Atrial fibrillation: Developed atrial fibrillation with RVR in setting of aspiration PNA on 7/4.   Continue amio 200 mg a day.   - Hold coumadin with  GI bleed. . INR 2.95  7. Malnutrition: Major issue with baseline poor nutritional status and now with ileus.  Albumin 2.2  Nutrition following. Off TNA 7/11. On Regular diet. dvance diet to soft.  - 8. Aspiration PNA with acute hypoxemic respiratory failure on 7/4: He was extubated on 7/6.  -WBC 7.5.    - Tracheal aspirate with Klebsiella and citrobacter. Both sensitive to Zosyn.  - Completes Zosyn today.  9. Ileus: Vomiting on 7/4 with aspiration PNA.  Resolved.  -10. Deconditioning: - PT following. CIR following.   I have consuled [facility]. Hold coumadin and aspirin. Will start heparin once INR < 1.8. Receiving 2UPRBCs today. Start IV protonix.    I reviewed the HVAD parameters from today, and compared the results to the patient's prior recorded data.  No programming changes were made.  The HVAD is functioning within specified parameters.     Tonye BecketAmy Clegg, NP 05/02/2017, 7:51 AM  VAD Team --- VAD ISSUES ONLY--- Pager [phone] (7am - 7am) Advanced Heart Failure Team  Pager [phone] (M-F; 7a - 4p)  Please contact [facility] for night-coverage after hours (4p -7a ) and weekends on [email or web address]  Patient seen and examined with Tonye BecketAmy Clegg, NP. We discussed all aspects of the encounter. I agree with the assessment and plan as stated above.   He remains tenuous. Developed recurrent GI bleed this am with multiple bouts of melena. INR 2.9. Will transfuse.  Volume status ok. Has been seen by GI. Plan for EGD on Monday. Hold coumadin. Start heparin when INR <= 1.8. Discussed with PharmD.  VAD parameters stable.   Arvilla MeresBensimhon, Isamar Wellbrock, MD  3:46 PM

## 2017-05-02 NOTE — Progress Notes (Addendum)
[facility] and had recurred Hx ischemic cardiomyopathy    S/p VAD with numerous complications including AFib andrequiring ongoing ionotropic support  Anxious without chest pain and some sob   BP 123/65   Pulse 74   Temp 99.3 F (37.4 C) (Axillary)   Resp (!) 28   Ht 6' (1.829 m)   Wt 224 lb 8 oz (101.8 kg)   SpO2 100%   BMI 30.45 kg/m   Cachectic and on oxygen in mod distress HENT normal Neck supple   Clear Rapid but regular rate and rhythm,  VAD hum Abd-soft  No Clubbing cyanosis edema Skin-warm and dry A & Oriented  Grossly normal sensory and motor function  K 3/8 this am   Device interrogation >> tachytherapy off Tele   WCT with VA dissoc demonstrated>>>VT   Impression  VT ICD VAD ICM  ATP delivered via ICD at 240 msec with successful termination Amiodarone rebolused x 3

## 2017-05-02 NOTE — Progress Notes (Addendum)
PT Cancellation Note  Patient Details Name: James FabianHarry D Orvis MRN: [id number] DOB: 26-Jun-1961   Cancelled Treatment:    Reason Eval/Treat Not Completed: Medical issues which prohibited therapy Pt has nose bleed this am and low Hbg. Pt getting blood transfusion. PT will check on pt later as time allows.    Derek MoundKellyn R Aprile Dickenson Orlondo Holycross, PTA Pager: [phone]   05/02/2017, 9:28 AM

## 2017-05-02 NOTE — Progress Notes (Signed)
Chaplain stopped by while noticing medical staff around patient.  Per nurse, patient was shocked.  Chaplain will continue to pray for patient as patient enjoys prayer.    05/02/17 1430  Clinical Encounter Type  Visited With Patient;Health care provider  Visit Type Follow-up;Spiritual support;Social support;Psychological support;Critical Care  Referral From Chaplain  Consult/Referral To Chaplain

## 2017-05-02 NOTE — Progress Notes (Signed)
Existing VAD dressing removed and site care performed using sterile technique. Drive line exit site cleaned with Chlora prep applicators x 2, allowed to dry, and gauze dressing with Aquacel silver strip re-applied. Drive line exit with no tissue ingrowth. No redness around exit site, moderate amount of drainage with no foul odor. Both sutures intact.Stabilization device present and accurately applied. Pt denies fever or chills. Tonye BecketAmy Clegg, NP notified that driveline is not healing well and sutures still could not be removed today due to a non healing state.  Carlton AdamSarah Lauriann Milillo RN, BSN VAD Coordinator [phone] 24/7 pager

## 2017-05-02 NOTE — Progress Notes (Signed)
Called to the bedside as pt was in VT vs. Atrial flutter. VAD parameters were stable and unchanged. Dr. Gala RomneyBensimhon had already decreased speed on the VAD to 2740. Pt was given multiple meds and attempts were made to pace pt out of the rhythm. These were all failed. Pt was cardioverted to a slow sinus rhythm. We will leave pt at 2740 speed,  flow is 5.3, power is 4.4. Back up controller set to new speed and suction alarm turned back on. Please call with questions.  Carlton AdamSarah Wave Calzada RN, BSN VAD Coordinator [phone] 24/7 VAD pager

## 2017-05-02 NOTE — Progress Notes (Signed)
LVAD Inpatient Coordinator Rounding Note:  Admitted 04/13/2017 due to A/C Heart failure.   HeartWare LVAD implanted on 03/28/2017 by Dr. Laneta SimmersBartle as DT VAD.  Vital signs: HR: 96 Doppler Pressure: not done BP: 98/80 (87) O2 Sat:  99 on Room Air Wt:207>209 > 213 >213 > 208>203>220>227lbs>226>225>224   LVAD interrogation reveals:  Speed:  2820 Flow:  5.3 Power:  4.6 Alarms:none Peak: 7.5 Trough: 3.3 HCT: 18-pump set to 20 (lowest feature possible) Low flow alarm setting: 3.2 High watt alarm setting:   7.0  Suction:  Turned on Lavare cycle:  on  Blood Products: 6/28> 5 PRBC's, 6 FFP 7/1> 1 PRBC 7/9> 1 PRBC 7/13> 2 PRBC  Gtts: Milrinone restarted 7/8 currently @ 0.125 mcg/kg/min Levo stopped 04/26/17 Amiodarone stopped 04/29/17 Heparin stopped 04/29/17  TPN - started 04/24/17 for nutritional support  Arrhythmia: 04/24/17 - Afib with RVR - started amiodarone  Respiratory: 04/23/17 - re-intubated due to respiratory failure secondary to suspected aspiration pneumonia 04/25/17-extubated  Drive Line: Daily Dressing Kits with Aquacell AG silver strips per protocol.  VAD coordinator will do today.  Labs:  LDH trend:160 (pre VAD)>227>215>191>212>207>222>217>219>163>207>217>200  INR trend: 1.31>1.45>1.44>1.25>1.33>1.68>2.61>2.4>1.81>2.19>2.09>2.28  Anticoagulation Plan: -INR Goal: 2-2.5 started 04/19/17 -ASA Dose: 325 mg  Adverse Events on VAD: -Return to OR 6/28  with high chest tube output, evacuation of mediastinal hematoma -Ileus with vomiting on 04/23/17; re-intubation for acute respiratory failure;  probable aspiration pneumonia -7/13-GI bleed   Plan/Recommendations:  1. Transfuse blood. 2. VAD coordinator will do the dressing change today.  3. GI consult today.  Carlton AdamSarah Jalen Daluz RN, VAD Coordinator 24/7 pager [phone]

## 2017-05-02 NOTE — Progress Notes (Signed)
OT Cancellation Note  Patient Details Name: Edd FabianHarry D Helwig MRN: [id number] DOB: 1960-10-29   Cancelled Treatment:    Reason Eval/Treat Not Completed: Medical issues which prohibited therapy.    Kyllie Pettijohn [place], OTR/L [phone]   [name], Cayli Escajeda M 05/02/2017, 3:49 PM

## 2017-05-02 NOTE — Progress Notes (Signed)
Inpatient Rehabilitation  Continuing to follow for timing of medical readiness.  I am initiating insurance authorization in hopes of medical readiness for IP Rehab admission next week.  Will need updated PT/OT notes from Sunday or Monday as [facility].  Plan to update the team as I know.  Please call with questions.   Charlane FerrettiMelissa Nakea Gouger, M.A., CCC/SLP Admission Coordinator  [facility]  Cell [phone]

## 2017-05-02 NOTE — Procedures (Signed)
     DIRECT CURRENT CARDIOVERSION  NAME:  James [name]   MRN: [id number] DOB:  September 02, 1961   ADMIT DATE: 03/29/2017   INDICATIONS: Wide complex tachycardia   PROCEDURE:   Patient with WCT with hemodynamic compromise. Emergent consent implied. The patient had the defibrillator pads placed in the anterior and posterior position. The patient then underwent sedation in conjunction with the CCM service. Once an appropriate level of sedation was achieved, the patient received a single biphasic, synchronized 200J shock with prompt conversion to sinus rhythm. No apparent complications.  Arvilla MeresBensimhon, Donnalynn Wheeless, MD  3:53 PM

## 2017-05-02 NOTE — Progress Notes (Signed)
ANTICOAGULATION CONSULT NOTE - Follow Up Consult  Pharmacy Consult for Warfarin > holding, Heparin when INR < 1.8 Indication: HVAD  No Active Allergies  Patient Measurements: Height: 6' (182.9 cm) Weight: 224 lb 8 oz (101.8 kg) IBW/kg (Calculated) : 77.6  Vital Signs: Temp: 98.2 F (36.8 C) (07/13 0753) Temp Source: Oral (07/13 0753) BP: 87/74 (07/13 0700) Pulse Rate: 93 (07/13 0700)  Labs:  Recent Labs  04/30/17 0315 05/01/17 0323 05/02/17 0346  HGB 8.4* 7.7* 5.9*  HCT 25.1* 22.7* 18.0*  PLT 282 284 227  LABPROT 23.8* 25.5* 31.3*  INR 2.09 2.28 2.95  CREATININE 3.00* 2.59* 2.26*    Estimated Creatinine Clearance: 46.1 mL/min (A) (by C-G formula based on SCr of 2.26 mg/dL (H)).   Assessment: 56yom s/p HVAD 6/28, started on coumadin per MD 6/30. He developed an ileus on 7/5, warfarin was held, and heparin bridge started once INR fell below 1.8.  Heparin stopped 7/10 with INR 2.19 and pharmacy asked to take over warfarin dosing. INR therapeutic today at 2.95, however, Hgb down to 5.9 and he is having nosebleeds and bloody stools.  Plan to hold warfarin and aspirin for now, transfuse and start heparin when INR < 1.8.  Goal of Therapy:  Heparin level 0.3-0.5 INR goal 2-3 Monitor platelets by anticoagulation protocol: Yes   Plan:  1) Hold warfarin and aspirin 2) Daily INR - start heparin when < 1.8, aim for lower goal 3) Follow up GI consult  Fredrik RiggerMarkle, Tamekia Rotter Sue 05/02/2017,8:27 AM

## 2017-05-02 NOTE — Progress Notes (Signed)
Orthopedic Tech Progress Note Patient Details:  James [name] [birth year] [id number]  Ortho Devices Type of Ortho Device: Radio broadcast assistantUnna boot Ortho Device/Splint Location: applied bilateral unna boots.  pt tolerated application very well. Ortho Device/Splint Interventions: Application  Applied unna boots because nurse had to removed unna boot to apply dressing to leg.  James [name], James [name] 05/02/2017, 9:43 PM

## 2017-05-02 NOTE — Consult Note (Signed)
Name: James [name] MRN: [id number] DOB: 1961-08-26    ADMISSION DATE:  04/01/2017 CONSULTATION DATE:  05/02/2017  REFERRING MD :  Dr. Gala Romney  CHIEF COMPLAINT:  Wide-complex ventricular tachycardia  HISTORY OF PRESENT ILLNESS:   43 yoM with PMH as below, significant for but not limited to CAD s/p CABG, ischemic cardiomypoathy/ systolic HF, CKD3 admitted on 4/09 s/p HVAD on 6/28.  Called to bedside by heart failure team for refractory wide-complex tachycardia, rate of 140,  needing airway monitoring for conscious sedation for cardioversion.    PAST MEDICAL HISTORY :   has a past medical history of AICD (automatic cardioverter/defibrillator) present (09/2015); CHF (congestive heart failure) (HCC); Chronic edema; CMI (chronic mesenteric ischemia) (HCC); Coronary artery disease; Dyspnea; Gout; Gout; Hypertension; Ischemic cardiomyopathy; Myocardial infarction (HCC); Obesity; Renal insufficiency; and Type II diabetes mellitus (HCC).  has a past surgical history that includes Cardiac catheterization (N/A, 10/06/2015); Shoulder open rotator cuff repair (Right, ~ 2000); Coronary artery bypass graft (11/2010); Cardiac catheterization (11/2010); Cardiac catheterization (N/A, 07/15/2016); Right Heart Cath (N/A, 11/27/2016); Right Heart Cath (N/A, 04/05/2017); Right Heart Cath (N/A, 03/26/2017); Insertion of implantable left ventricular assist device (N/A, 03/30/2017); and TEE without cardioversion (N/A, 03/30/2017). Prior to Admission medications   Medication Sig Start Date End Date Taking? Authorizing Provider  acetaminophen (TYLENOL) 500 MG tablet Take 500-1,000 mg by mouth every 4 (four) hours as needed for headache (pain).   Yes [provider]  aspirin EC 81 MG tablet Take 81 mg by mouth daily.   Yes [provider]  atorvastatin (LIPITOR) 80 MG tablet TAKE ONE TABLET BY MOUTH DAILY 09/20/16  Yes Laurey Morale, MD  carvedilol (COREG) 25 MG tablet Take 0.5 tablets (12.5 mg total) by mouth  2 (two) times daily with a meal. Please schedule appointment for refills. 04/08/17  Yes Laurey Morale, MD  Colchicine (MITIGARE) 0.6 MG CAPS Take 1 capsule by mouth 2 (two) times daily.   Yes [provider]  colchicine 0.6 MG tablet Take 1 tablet (0.6 mg total) by mouth daily. Patient taking differently: Take 0.6 mg by mouth daily as needed (gout).  04/08/17  Yes Laurey Morale, MD  digoxin (LANOXIN) 0.125 MG tablet Take 0.5 tablets (0.0625 mg total) by mouth daily. 02/20/17  Yes Laurey Morale, MD  febuxostat (ULORIC) 40 MG tablet Take 120 mg by mouth daily.   Yes [provider]  glipiZIDE (GLUCOTROL) 5 MG tablet TAKE 1 TABLET (5 MG TOTAL) BY MOUTH 2 (TWO) TIMES DAILY BEFORE MEALS (REFER FUTURE REFILLS TO PRIMARY CARE PHYSICIAN) 04/24/16  Yes Hochrein, Fayrene Fearing, MD  isosorbide-hydrALAZINE (BIDIL) 20-37.5 MG tablet Take 1.5 tablets by mouth 3 (three) times daily. 03/24/17  Yes Laurey Morale, MD  nitroGLYCERIN (NITROSTAT) 0.4 MG SL tablet Place 1 tablet (0.4 mg total) under the tongue every 5 (five) minutes as needed for chest pain. 02/18/17 05/07/20 Yes Laurey Morale, MD  torsemide (DEMADEX) 20 MG tablet Take 2 tablets (40 mg total) by mouth daily. 04/08/17  Yes Laurey Morale, MD   No Active Allergies  FAMILY HISTORY:  family history includes Diabetes in his other; Diabetes type II in his mother; Hypertension in his father, mother, other, paternal grandmother, and sister. SOCIAL HISTORY:  reports that he has never smoked. He has quit using smokeless tobacco. His smokeless tobacco use included Chew. He reports that he drinks alcohol. He reports that he does not use drugs.  REVIEW OF SYSTEMS:  Unable to be assessed.  SUBJECTIVE:  Currently on amio and dobutamine  VITAL SIGNS: Temp:  [97.4 F (36.3 C)-98.4 F (36.9 C)] 98 F (36.7 C) (07/13 1215) Pulse Rate:  [28-100] 90 (07/13 1300) Resp:  [22-30] 22 (07/13 1300) BP: (87-104)/(69-87) 99/87 (07/13 1300) SpO2:  [98  %-100 %] 100 % (07/13 1300) Weight:  [224 lb 8 oz (101.8 kg)] 224 lb 8 oz (101.8 kg) (07/13 0500)  PHYSICAL EXAMINATION: General:  Adult male lying in bed in NAD Neuro:  Drowsy, awakens to voice, follows commands Cardiovascular:  HR 140 Lungs:  Even/non-labored, rr 24, 100% on NRB Skin:  Warm/dry, BLE dressings    Recent Labs Lab 04/30/17 0315 05/01/17 0323 05/02/17 0346  NA 129* 131* 133*  K 3.8 3.9 3.8  CL 99* 101 105  CO2 21* 21* 21*  BUN 88* 91* 89*  CREATININE 3.00* 2.59* 2.26*  GLUCOSE 144* 151* 134*    Recent Labs Lab 04/30/17 0315 05/01/17 0323 05/02/17 0346  HGB 8.4* 7.7* 5.9*  HCT 25.1* 22.7* 18.0*  WBC 11.5* 9.8 7.5  PLT 282 284 227   Dg Chest Port 1 View  Result Date: 05/02/2017 CLINICAL DATA:  Chest pain EXAM: PORTABLE CHEST 1 VIEW COMPARISON:  May 01, 2017 FINDINGS: There is slight atelectasis in the bases. Lungs elsewhere are clear. Central catheter tip is in the superior vena cava. Pacemaker lead is attached to the right ventricle. There is a left ventricular assist device, incompletely visualized. There is cardiomegaly with pulmonary venous hypertension. No adenopathy. There is degenerative change in the left shoulder with superior migration of the left humeral head. IMPRESSION: Bibasilar atelectasis. No edema or consolidation. Stable cardiomegaly with pulmonary vascular congestion. No pneumothorax. There is superior migration of the left humeral head, a finding indicative of chronic rotator cuff tear. Electronically Signed   By: Bretta BangWilliam  Woodruff III M.D.   On: 05/02/2017 08:17   Dg Chest Port 1 View  Result Date: 05/01/2017 CLINICAL DATA:  Left ventricular assist device EXAM: PORTABLE CHEST 1 VIEW COMPARISON:  04/29/2017 chest radiograph. FINDINGS: Stable configuration of single lead left subclavian pacemaker, left ventricular assist device and intact sternotomy wires. Right PICC terminates at the cavoatrial junction. Stable cardiomediastinal silhouette  with mild cardiomegaly. No pneumothorax. No pleural effusion. Low lung volumes. Mild pulmonary edema, decreased. Stable tiny apical right lung granuloma. IMPRESSION: 1. Well-positioned support structures . 2. Mild congestive heart failure, improved . 3. Low lung volumes. Electronically Signed   By: Delbert PhenixJason A Poff M.D.   On: 05/01/2017 08:04    ASSESSMENT / PLAN:  Airway monitoring for conscious sedation in the setting of cardioversion for refractory wide-complex ventricular tachycardia CAD, ICM/ systolic HF s/p CABG and HVAD on 6/28 - patient administered total 5mg  versed, fentanyl 100 mcg and etomidate 10mg   - cardioverted by HF team  P:  Continuous cardiac and pulse ox monitoring Continue NRB for procedure Emergency airway supplies at bedside Airway intact for procedure and post-procedure  Ongoing monitoring in ICU  Rest per HF team  CCT 30mins  PCCM will be available as needed.    Posey BoyerBrooke Simpson, NP Pulmonary and Critical Care Medicine [facility] Pager: [phone]  05/02/2017, 3:29 PM

## 2017-05-02 NOTE — Progress Notes (Signed)
Physical Therapy Treatment Patient Details Name: James [name] MRN: [id number] DOB: 11/14/1960 Today's Date: 05/02/2017    History of Present Illness  Pt adm with acute on chronic heart failure and underwent Heartware HVAD implant on 6/28. Returned to OR later that day for evacuation of mediastinal hematoma. On 7/4 developed ileus with likely aspiration and intubated 7/4-7/6. PMH - gout, DMII, HTN, CKD, CAD S/P CABGx6 2012, chronic systolic heart failure and [employer] ICD. presents now s/p heartware HVAD implant    PT Comments    Patient continues to require assistance for management of HVAD equipment and for transfers. Pt able to ambulate with min A. Nursing staff and MD present and took over care of pt end of session due to arhythmia. PT will continue to follow acutely.   Follow Up Recommendations  CIR;Supervision for mobility/OOB     Equipment Recommendations  Other (comment) (TBD)    Recommendations for Other Services Rehab consult     Precautions / Restrictions Precautions Precautions: Sternal Precaution Comments: Heartware HVAD, external pacer wires, A-line Restrictions Weight Bearing Restrictions: Yes Other Position/Activity Restrictions: Sternal precautions    Mobility  Bed Mobility Overal bed mobility: Needs Assistance Bed Mobility: Rolling;Sidelying to Sit;Sit to Sidelying Rolling: Min assist Sidelying to sit: Min assist;+2 for physical assistance     Sit to sidelying: Mod assist General bed mobility comments: cues for sequencing; assist at bilat LE and trunk  Transfers Overall transfer level: Needs assistance Equipment used: Rolling walker (2 wheeled) Transfers: Sit to/from [employer] Sit to Stand: Min assist         General transfer comment: assist to power up into standing and steady from EOB and recliner;  pt using momentum to stand; cues for technique  Ambulation/Gait Ambulation/Gait assistance: +2 safety/equipment;Min  assist Ambulation Distance (Feet): 250 Feet Assistive device: Rolling walker (2 wheeled) Gait Pattern/deviations: Step-through pattern;Decreased stride length;Trunk flexed Gait velocity: decreased   General Gait Details: 2 standing rest breaks; 1-2/4 DOE with ambulation; HR up to 126 and RR in 30s; SpO2 WNL   Stairs            Wheelchair Mobility    Modified Rankin (Stroke Patients Only)       Balance Overall balance assessment: Needs assistance Sitting-balance support: No upper extremity supported;Feet supported Sitting balance-Leahy Scale: Fair     Standing balance support: Bilateral upper extremity supported Standing balance-Leahy Scale: Poor                              Cognition Arousal/Alertness: Awake/alert Behavior During Therapy: WFL for tasks assessed/performed Overall Cognitive Status: Within Functional Limits for tasks assessed                                        Exercises      General Comments General comments (skin integrity, edema, etc.): end of session pt had BM with dark blood-RN aware; upon lying back down in bed pt in sustained VT per RN and nursing staff present to assess/treat pt      Pertinent Vitals/Pain Pain Assessment: Faces Faces Pain Scale: Hurts a little bit Pain Location: sternum with transitional movements Pain Descriptors / Indicators: Discomfort;Sore Pain Intervention(s): Monitored during session;Repositioned    Home Living  Prior Function            PT Goals (current goals can now be found in the care plan section) Progress towards PT goals: Progressing toward goals    Frequency    Min 3X/week      PT Plan Current plan remains appropriate    Co-evaluation              AM-PAC PT "6 Clicks" Daily Activity  Outcome Measure  Difficulty turning over in bed (including adjusting bedclothes, sheets and blankets)?: Total Difficulty moving from lying  on back to sitting on the side of the bed? : Total Difficulty sitting down on and standing up from a chair with arms (e.g., wheelchair, bedside commode, etc,.)?: Total Help needed moving to and from a bed to chair (including a wheelchair)?: A Little Help needed walking in hospital room?: A Little Help needed climbing 3-5 steps with a railing? : A Lot 6 Click Score: 11    End of Session   Activity Tolerance: Other (comment) (medical complication end of session) Patient left: with call bell/phone within reach;in bed;with nursing/sitter in room Nurse Communication: Mobility status PT Visit Diagnosis: Unsteadiness on feet (R26.81);Muscle weakness (generalized) (M62.81);Difficulty in walking, not elsewhere classified (R26.2)     Time: 1330-1410 PT Time Calculation (min) (ACUTE ONLY): 40 min  Charges:  $Gait Training: 8-22 mins $Therapeutic Activity: 23-37 mins                    G Codes:       Erline LevineKellyn Dariel Betzer, PTA Pager: [phone]     Carolynne EdouardKellyn R Janit Cutter 05/02/2017, 4:46 PM

## 2017-05-02 NOTE — Progress Notes (Signed)
Paged by bedside nurse to report Hgb of 5.9. Instructed nurse to transfuse 2 units of PRBCs per Dr. Gala RomneyBensimhon and send a fecal occult.

## 2017-05-02 NOTE — Progress Notes (Signed)
  Patient developed WCT at 150 bpm suspicious for VT. BP stable.   Felt to be related to suction event from low volume status. VAD speed decreased from 2820 -> 2740.   Received 300mg  amiodarone and 100mg x2 lidocaine and MG 2 gr with no effect.   We then contacted [employer] rep and we tried to overdrive pace him without success. Was felt rhythm might be AFL. Given adenosine 12mg  x 1 with no effect.  Patient's BP then dropped with MAPs in the 50s. Neosynephine started.  CCM and RT paged for further sedation and airway support.   Sedated with versed and fentanyl + 10mg  of etomidate. Then received 200J synchronize shock with conversion to NSR.   CCT > 1 hour not including procedures.   Arvilla MeresBensimhon, Dawit Tankard, MD  3:51 PM

## 2017-05-02 NOTE — Progress Notes (Signed)
Daily Rounding Note  05/02/2017, 8:22 AM  LOS: 21 days   Reason for re-evaluation:  Dr Aundra Dubin requests GI re-eval pt for anemia and rectal blood  PMH DM 2, non-insulin requiring PTA.  CKD stage 3.  CAD, s/p CABG 2012.  S/p ICD 2016.  Gout. CHF with pre-VAD EF 15%.     Seen by Dr Fuller Plan 04/12/17 pre LVAD placement for anemia.  Hgb then 8.4, MCV 85.  Iron, TIBC, Iron Sats low, Ferritin, Folate, B12 all at or above normal.  FOBT was negative. Due to high sedation risk, and FOBT neg stool, Dr Fuller Plan did not pursue endoscopic work up in pt with no active pathologic GI sxs. No previous EGD/Colonoscopy etc.  CT ab/pelvis w/o contrast 04/12/17: GB stones.  Partially obscured pancreas and portions of bowel.  No bowel abnormalities.  Elevated left diaphragm with stomach positioned behind left ventricle.  Aortic atherosclerosis.  Anasarca.    "Destination" VAD (pt non-transplant candidate) placed  03/27/2017 following aggressive diuresis and Milrinone support.  Required return to OR post-op day 0 for evacuation of mediastinal hematoma.  Extubated 6/29.   Immediate post op issues included deconditioning, anemia, hiccups, AKI,  N/V on 7/4, KUB showed SB and colonic ileus.  NGT returned 3 liters on initial placement, according to MD note aspirate "looked like liquid stool".  Hypoxic resp failure with likely Asp PNA on CXR, new Afib with RVR, hypotension, resp failure: required reintubation and Neo, levophed, amiodarone. 2 low-flow alarms on HVAD occurred in setting of suctioning and coughing. TPN added 7/5 to address severe malnutrition (pre-albumin <5).  Remains in place. Extubated 7/6.   Total PRBCs of 7 from 6/28 - 7/9.  Last 2 units were on 7/1 and on 7/9.   FFP x 6 on 6/28 - 6/29.   Heparin gtt stopped early AM 7/10.  Coumadin also d/c'd last dosed yesterday 7/12 at 1800.  ASA 81 mg d/c'd, last dose was 7/12.   INRs ranging 2.0 - 2.9 in last 4 days.     Had been on Protonix but dc'd, last dose 7/4.  Today reordered BID IV.    2 days ago was having loose, brown stools.  Suspected blood in BM overnight and is FOBT + confirmed, in last 24 hours. Nose bleed, small volume, 2 days ago and this AM.  No bleeding from wound site.  No large hematomas.  Hgb 8.4 .. 7.7.. 5.9 in last 3 days.  Will get another 2 PRBCs today and got Feraheme 7/12.  Last KUB on 7/8 with persistent prominent colonic gas pattern with mild interval decrease in small bowel distention.  Restarted on soft diet 7/10.  No recurrent n/v.  No abd pain.   Scheduled Meds: . amiodarone  200 mg Oral BID  . Chlorhexidine Gluconate Cloth  6 each Topical Daily  . febuxostat  40 mg Oral Daily  . fentaNYL (SUBLIMAZE) injection  50 mcg Intravenous Once  . hydrALAZINE  25 mg Oral Q8H  . insulin aspart  0-15 Units Subcutaneous TID WC  . isosorbide mononitrate  30 mg Oral Daily  . pantoprazole (PROTONIX) IV  40 mg Intravenous Q12H  . sodium chloride flush  10-40 mL Intracatheter Q12H  . sodium chloride flush  3 mL Intravenous Q12H  . torsemide  40 mg Oral Daily   Continuous Infusions: . sodium chloride Stopped (04/19/17 1200)  . sodium chloride Stopped (04/21/17 1500)  . sodium chloride    .  lactated ringers    . lactated ringers Stopped (05/01/17 0810)  . milrinone 0.125 mcg/kg/min (05/02/17 0700)   PRN Meds:.albuterol, docusate, hydrALAZINE, neomycin-bacitracin-polymyxin, ondansetron (ZOFRAN) IV, oxyCODONE, phenol, sodium chloride flush, sodium chloride flush, traMADol  OBJECTIVE:         Vital signs in last 24 hours:    Temp:  [97.4 F (36.3 C)-98.2 F (36.8 C)] 98.2 F (36.8 C) (07/13 0753) Pulse Rate:  [28-100] 93 (07/13 0700) Resp:  [23-33] 27 (07/13 0700) BP: (87-107)/(66-89) 87/74 (07/13 0700) SpO2:  [98 %-100 %] 100 % (07/13 0700) Weight:  [101.8 kg (224 lb 8 oz)] 101.8 kg (224 lb 8 oz) (07/13 0500) Last BM Date: 05/02/17 Filed Weights   04/30/17 0600 05/01/17 0530  05/02/17 0500  Weight: 102.5 kg (226 lb) 102.5 kg (225 lb 14.4 oz) 101.8 kg (224 lb 8 oz)   General: alert, comfortable, looks chronically ill and unwell.     Heart: loud VAD hum, unable to appreciate underlying rhythm.  Sternotomy scar is CDI.    Chest: diminished BS bil.  No dyspnea.  Tight cough. No dyspnea Abdomen: soft, NT, ND.  No HSM, no masses  Extremities: 2 to 3 plus LE edema.  Tophaceous gout deformities in fingers Derm:  No telangectasia or significant bruising.   Neuro/Psych:  Oriented x 3, calm, appropriate.  Affect flat but overall not sensing signs of active depression.  Moves all 4 limbs.   MS: weak.  Difficult for him to sit up for lung exam.    Intake/Output from previous day: 07/12 0701 - 07/13 0700 In: 1018.2 [P.O.:600; I.V.:111.2; IV Piggyback:217] Out: 2175 [Urine:2175]  Intake/Output this shift: No intake/output data recorded.  Lab Results:  Recent Labs  04/30/17 0315 05/01/17 0323 05/02/17 0346  WBC 11.5* 9.8 7.5  HGB 8.4* 7.7* 5.9*  HCT 25.1* 22.7* 18.0*  PLT 282 284 227   BMET  Recent Labs  04/30/17 0315 05/01/17 0323 05/02/17 0346  NA 129* 131* 133*  K 3.8 3.9 3.8  CL 99* 101 105  CO2 21* 21* 21*  GLUCOSE 144* 151* 134*  BUN 88* 91* 89*  CREATININE 3.00* 2.59* 2.26*  CALCIUM 7.8* 7.7* 7.7*   LFT No results for input(s): PROT, ALBUMIN, AST, ALT, ALKPHOS, BILITOT, BILIDIR, IBILI in the last 72 hours. PT/INR  Recent Labs  05/01/17 0323 05/02/17 0346  LABPROT 25.5* 31.3*  INR 2.28 2.95   Hepatitis Panel No results for input(s): HEPBSAG, HCVAB, HEPAIGM, HEPBIGM in the last 72 hours.  Studies/Results: Dg Chest Port 1 View  Result Date: 05/02/2017 CLINICAL DATA:  Chest pain EXAM: PORTABLE CHEST 1 VIEW COMPARISON:  May 01, 2017 FINDINGS: There is slight atelectasis in the bases. Lungs elsewhere are clear. Central catheter tip is in the superior vena cava. Pacemaker lead is attached to the right ventricle. There is a left  ventricular assist device, incompletely visualized. There is cardiomegaly with pulmonary venous hypertension. No adenopathy. There is degenerative change in the left shoulder with superior migration of the left humeral head. IMPRESSION: Bibasilar atelectasis. No edema or consolidation. Stable cardiomegaly with pulmonary vascular congestion. No pneumothorax. There is superior migration of the left humeral head, a finding indicative of chronic rotator cuff tear. Electronically Signed   By: Lowella Grip III M.D.   On: 05/02/2017 08:17   Dg Chest Port 1 View  Result Date: 05/01/2017 CLINICAL DATA:  Left ventricular assist device EXAM: PORTABLE CHEST 1 VIEW COMPARISON:  04/29/2017 chest radiograph. FINDINGS: Stable configuration of single lead left  subclavian pacemaker, left ventricular assist device and intact sternotomy wires. Right PICC terminates at the cavoatrial junction. Stable cardiomediastinal silhouette with mild cardiomegaly. No pneumothorax. No pleural effusion. Low lung volumes. Mild pulmonary edema, decreased. Stable tiny apical right lung granuloma. IMPRESSION: 1. Well-positioned support structures . 2. Mild congestive heart failure, improved . 3. Low lung volumes. Electronically Signed   By: Ilona Sorrel M.D.   On: 05/01/2017 08:04    ASSESMENT:   *  Acute on chronic anemia in pt s/p 6/28 LVAD placement. Hgb drop of nearly 2 grams in 24 hours, 3 grams in last 72 hours.   The amount of slightly bloody stool and slight amount nasal bleeding described do not correlate with the larger degree of Hgb decline.  It would be early for him to have developed post VAD gastro-intestinal AVMs.   Given events of last week, hypotension related gut ischemia is a possibility but for several days did not have bloody stools and has not had any abdominal pain.   Dark emesis last week, possibly from gastritis or PUD is currently covered with IV BID Protonix.   Got Feraheme 7/12 and getting 2 more PRBCs today  (for cumulative total of 9)  *  Malnutrition.  Off TPN as of 7/11, fair appetite.  Do not see any nutritional supplement orders.    *  Post op ileus, resolved.  N/V due to ileus resulting in aspiration PNA, sepsis, hypoxic resp failure and reintubation, pressor requiring hypotension: resolved.  On Zosyn.    *  CHF.  VAD placed 6/28.  Remains on Milrinone.     PLAN   *  Discussed case with Dr Hilarie Fredrickson.  Will observe pt over weekend, allow INR to drift and plan MAC assisted EGD on Monday 7/16.    *  Could consider non-contrast ab/pelvic CT to r/o RP hematoma.    *  Will ask RD to make recs re oral nutrition supplements now that he is taking po and off TPN.     Azucena Freed  05/02/2017, 8:22 AM Pager: [phone]

## 2017-05-03 DIAGNOSIS — R791 Abnormal coagulation profile: Secondary | ICD-10-CM

## 2017-05-03 DIAGNOSIS — K921 Melena: Secondary | ICD-10-CM

## 2017-05-03 DIAGNOSIS — R710 Precipitous drop in hematocrit: Secondary | ICD-10-CM

## 2017-05-03 DIAGNOSIS — I472 Ventricular tachycardia, unspecified: Secondary | ICD-10-CM

## 2017-05-03 DIAGNOSIS — D62 Acute posthemorrhagic anemia: Secondary | ICD-10-CM

## 2017-05-03 LAB — GLUCOSE, CAPILLARY
GLUCOSE-CAPILLARY: 173 mg/dL — AB (ref 65–99)
GLUCOSE-CAPILLARY: 165 mg/dL — AB (ref 65–99)
GLUCOSE-CAPILLARY: 203 mg/dL — AB (ref 65–99)
Glucose-Capillary: 203 mg/dL — ABNORMAL HIGH (ref 65–99)

## 2017-05-03 LAB — CBC
HCT: 21.1 % — ABNORMAL LOW (ref 39.0–52.0)
HCT: 26.3 % — ABNORMAL LOW (ref 39.0–52.0)
HCT: 22.9 % — ABNORMAL LOW (ref 39.0–52.0)
HEMOGLOBIN: 7.8 g/dL — AB (ref 13.0–17.0)
Hemoglobin: 7.1 g/dL — ABNORMAL LOW (ref 13.0–17.0)
Hemoglobin: 9.1 g/dL — ABNORMAL LOW (ref 13.0–17.0)
MCH: 29 pg (ref 26.0–34.0)
MCH: 29.8 pg (ref 26.0–34.0)
MCH: 29.9 pg (ref 26.0–34.0)
MCHC: 33.6 g/dL (ref 30.0–36.0)
MCHC: 34.6 g/dL (ref 30.0–36.0)
MCHC: 34.1 g/dL (ref 30.0–36.0)
MCV: 86.1 fL (ref 78.0–100.0)
MCV: 86.2 fL (ref 78.0–100.0)
MCV: 87.7 fL (ref 78.0–100.0)
PLATELETS: 186 10*3/uL (ref 150–400)
Platelets: 190 10*3/uL (ref 150–400)
Platelets: 185 10*3/uL (ref 150–400)
RBC: 2.45 MIL/uL — AB (ref 4.22–5.81)
RBC: 3.05 MIL/uL — AB (ref 4.22–5.81)
RBC: 2.61 MIL/uL — AB (ref 4.22–5.81)
RDW: 18.5 % — AB (ref 11.5–15.5)
RDW: 17.9 % — AB (ref 11.5–15.5)
RDW: 17.9 % — ABNORMAL HIGH (ref 11.5–15.5)
WBC: 7.7 10*3/uL (ref 4.0–10.5)
WBC: 11.8 10*3/uL — AB (ref 4.0–10.5)
WBC: 13.2 10*3/uL — AB (ref 4.0–10.5)

## 2017-05-03 LAB — PROTIME-INR
INR: 3.58
PROTHROMBIN TIME: 36.6 s — AB (ref 11.4–15.2)

## 2017-05-03 LAB — PREPARE RBC (CROSSMATCH)

## 2017-05-03 LAB — BASIC METABOLIC PANEL
Anion gap: 7 (ref 5–15)
BUN: 83 mg/dL — AB (ref 6–20)
CO2: 22 mmol/L (ref 22–32)
Calcium: 7.6 mg/dL — ABNORMAL LOW (ref 8.9–10.3)
Chloride: 104 mmol/L (ref 101–111)
Creatinine, Ser: 2.02 mg/dL — ABNORMAL HIGH (ref 0.61–1.24)
GFR calc Af Amer: 41 mL/min — ABNORMAL LOW (ref >60)
GFR, EST NON AFRICAN AMERICAN: 36 mL/min — AB (ref >60)
GLUCOSE: 201 mg/dL — AB (ref 65–99)
POTASSIUM: 4.2 mmol/L (ref 3.5–5.1)
Sodium: 133 mmol/L — ABNORMAL LOW (ref 135–145)

## 2017-05-03 LAB — COOXEMETRY PANEL
Carboxyhemoglobin: 2.2 % — ABNORMAL HIGH (ref 0.5–1.5)
Methemoglobin: 1.1 % (ref 0.0–1.5)
O2 SAT: 66.1 %
TOTAL HEMOGLOBIN: 7.4 g/dL — AB (ref 12.0–16.0)

## 2017-05-03 LAB — LACTATE DEHYDROGENASE: LDH: 183 U/L (ref 98–192)

## 2017-05-03 LAB — MAGNESIUM: Magnesium: 1.9 mg/dL (ref 1.7–2.4)

## 2017-05-03 MED ORDER — SODIUM CHLORIDE 0.9 % IV SOLN
Freq: Once | INTRAVENOUS | Status: AC
  Administered 2017-05-04: via INTRAVENOUS

## 2017-05-03 MED ORDER — SODIUM CHLORIDE 0.9 % IV SOLN: Freq: Once | INTRAVENOUS | Status: DC

## 2017-05-03 NOTE — Progress Notes (Signed)
Progress Note  Patient Name: James [name] Date of Encounter: 05/03/2017  Primary Cardiologist: DB  Primary Electrophysiologist: JA   Patient Profile     56 y.o. male ICM s/ VAD with multiple post op complications inclu afib>>amio and then 7/13 acute and sustained VT  Requiring cardioversion and then repeat efforts with ATP direct programming  Subjective   Much better today withour recurrent palps  No chest pain  Inpatient Medications    Scheduled Meds: . Chlorhexidine Gluconate Cloth  6 each Topical Daily  . febuxostat  40 mg Oral Daily  . feeding supplement (ENSURE ENLIVE)  237 mL Oral BID BM  . fentaNYL (SUBLIMAZE) injection  50 mcg Intravenous Once  . hydrALAZINE  25 mg Oral Q8H  . insulin aspart  0-15 Units Subcutaneous TID WC  . isosorbide mononitrate  30 mg Oral Daily  . pantoprazole (PROTONIX) IV  40 mg Intravenous Q12H  . sodium chloride flush  10-40 mL Intracatheter Q12H  . sodium chloride flush  3 mL Intravenous Q12H  . torsemide  40 mg Oral Daily   Continuous Infusions: . sodium chloride Stopped (04/19/17 1200)  . sodium chloride Stopped (04/21/17 1500)  . sodium chloride    . sodium chloride 10 mL/hr at 05/03/17 0800  . amiodarone 60 mg/hr (05/03/17 0800)  . lactated ringers Stopped (05/01/17 0810)  . milrinone 0.125 mcg/kg/min (05/03/17 0800)  . phenylephrine (NEO-SYNEPHRINE) Adult infusion Stopped (05/02/17 2000)   PRN Meds: albuterol, docusate, hydrALAZINE, neomycin-bacitracin-polymyxin, ondansetron (ZOFRAN) IV, oxyCODONE, phenol, sodium chloride flush, sodium chloride flush, traMADol   Vital Signs    Vitals:   05/03/17 0730 05/03/17 0745 05/03/17 0800 05/03/17 0814  BP: 93/79  101/81   Pulse: 78 80 79   Resp: (!) 29 (!) 30 (!) 26   Temp: 98 F (36.7 C)  98.4 F (36.9 C) 97.6 F (36.4 C)  TempSrc: Oral   Oral  SpO2: 98% 98% 98%   Weight:      Height:        Intake/Output Summary (Last 24 hours) at 05/03/17 0959 Last data filed at  05/03/17 0948  Gross per 24 hour  Intake          1791.18 ml  Output             2726 ml  Net          -934.82 ml   Filed Weights   05/01/17 0530 05/02/17 0500 05/03/17 0600  Weight: 225 lb 14.4 oz (102.5 kg) 224 lb 8 oz (101.8 kg) 224 lb 3.2 oz (101.7 kg)    Telemetry    No ventricular tachycardia following yesterday's antitachycardia pacing- Personally Reviewed  ECG     Physical Exam    GEN: No acute distress.   Cardiac: RRR, VAD hums.  Respiratory: Clear to auscultation bilaterally. GI: Soft, d  MS:no edema; No deformity. Neuro:  Nonfocal  Psych: Normal affect  Skin Warm and dry   Labs    Chemistry Recent Labs Lab 04/28/17 0334  05/01/17 0323 05/02/17 0346 05/02/17 1628 05/03/17 0300  NA 130*  < > 131* 133* 135 133*  K 3.7  < > 3.9 3.8 4.2 4.2  CL 101  < > 101 105 101 104  CO2 20*  < > 21* 21*  --  22  GLUCOSE 154*  < > 151* 134* 230* 201*  BUN 80*  < > 91* 89* 84* 83*  CREATININE 3.20*  < > 2.59* 2.26* 1.80* 2.02*  CALCIUM 7.7*  < > 7.7* 7.7*  --  7.6*  PROT 4.9*  --   --   --   --   --   ALBUMIN 2.2*  --   --   --   --   --   AST 16  --   --   --   --   --   ALT 8*  --   --   --   --   --   ALKPHOS 104  --   --   --   --   --   BILITOT 1.0  --   --   --   --   --   GFRNONAA 20*  < > 26* 31*  --  36*  GFRAA 24*  < > 31* 36*  --  41*  ANIONGAP 9  < > 9 7  --  7  < > = values in this interval not displayed.   Hematology Recent Labs Lab 05/02/17 0346 05/02/17 1535 05/02/17 1628 05/03/17 0300  WBC 7.5 8.9  --  7.7  RBC 2.03* 2.49*  --  2.45*  HGB 5.9* 7.3* 7.1* 7.1*  HCT 18.0* 21.7* 21.0* 21.1*  MCV 88.7 87.1  --  86.1  MCH 29.1 29.3  --  29.0  MCHC 32.8 33.6  --  33.6  RDW 20.1* 18.3*  --  18.5*  PLT 227 213  --  186    Cardiac EnzymesNo results for input(s): TROPONINI in the last 168 hours. No results for input(s): TROPIPOC in the last 168 hours.   BNP Recent Labs Lab 05/01/17 0327  BNP 743.4*     DDimer No results for input(s):  DDIMER in the last 168 hours.   Radiology    Dg Chest Port 1 View  Result Date: 05/02/2017 CLINICAL DATA:  Chest pain EXAM: PORTABLE CHEST 1 VIEW COMPARISON:  May 01, 2017 FINDINGS: There is slight atelectasis in the bases. Lungs elsewhere are clear. Central catheter tip is in the superior vena cava. Pacemaker lead is attached to the right ventricle. There is a left ventricular assist device, incompletely visualized. There is cardiomegaly with pulmonary venous hypertension. No adenopathy. There is degenerative change in the left shoulder with superior migration of the left humeral head. IMPRESSION: Bibasilar atelectasis. No edema or consolidation. Stable cardiomegaly with pulmonary vascular congestion. No pneumothorax. There is superior migration of the left humeral head, a finding indicative of chronic rotator cuff tear. Electronically Signed   By: Bretta BangWilliam  Woodruff III M.D.   On: 05/02/2017 08:17       Device Interrogation     No interval VT   Assessment & Plan    Ventricular tachycardia-recurrent   Continue amiodarone. Would transition to po tomorrow  If no interval sustained VT We'll follow with you Will rebolus for VT NS  Signed, Sherryl MangesSteven Cortavious Nix, MD  05/03/2017, 9:59 AM

## 2017-05-03 NOTE — Progress Notes (Signed)
VAD coordinator informed of Hgb of 7.1 this morning. Hgb remains 7.1 last night and again this am after 1 unit transfused on 7/13 in the afternoon. Patient with large bloody/black bowel movement this morning. CVP 9 all other vital signs and VAD parameters stable at this time. Will continue to monitor.

## 2017-05-03 NOTE — Progress Notes (Signed)
Pt hemoglobin dropped from 9.1 at 1500 to 7.8 @ 2200. Cardiology paged, deferred to TCTS. TCTS paged, orders received to transfuse 1 unit PRBC. Will implement and continue to monitor.  Herma ArdMOSELEY, Kynsie Falkner F, RN

## 2017-05-03 NOTE — Progress Notes (Signed)
Patient ID: James [name], male   DOB: [birth year], 56 y.o.   MRN: [id number] TCTS DAILY ICU PROGRESS NOTE                   [address]            [place] [id number]          [phone]   16 Days Post-Op Procedure(s) (LRB): MEDIASTINAL EXPLORATION (N/A) Procedure: 1. Insertion of rightfemoral arterial line 2. Redo Median Sternotomy 3. Extracorporeal circulation 4. Implantation of Heartware HVADLeft Ventricular Assist Device.  Total Length of Stay:  LOS: 22 days   Subjective: Up the chair eating , feels better after episodes of vt last evening   Objective: Vital signs in last 24 hours: Temp:  [97.6 F (36.4 C)-99.3 F (37.4 C)] 97.6 F (36.4 C) (07/14 0814) Pulse Rate:  [53-90] 79 (07/14 0800) Cardiac Rhythm: Normal sinus rhythm;Heart block (07/14 0747) Resp:  [17-33] 26 (07/14 0800) BP: (85-123)/(56-98) 101/81 (07/14 0800) SpO2:  [97 %-100 %] 98 % (07/14 0800) Weight:  [224 lb 3.2 oz (101.7 kg)] 224 lb 3.2 oz (101.7 kg) (07/14 0600)  Filed Weights   05/01/17 0530 05/02/17 0500 05/03/17 0600  Weight: 225 lb 14.4 oz (102.5 kg) 224 lb 8 oz (101.8 kg) 224 lb 3.2 oz (101.7 kg)    Weight change: -4.8 oz (-0.136 kg)   Hemodynamic parameters for last 24 hours: CVP:  [3 mmHg-53 mmHg] 3 mmHg  Intake/Output from previous day: 07/13 0701 - 07/14 0700 In: 1721.9 [P.O.:420; I.V.:956.9; Blood:295; IV Piggyback:50] Out: 2726 [Urine:2725; Stool:1]  Intake/Output this shift: Total I/O In: 76.9 [I.V.:46.9; Blood:30] Out: -   Current Meds: Scheduled Meds: . Chlorhexidine Gluconate Cloth  6 each Topical Daily  . febuxostat  40 mg Oral Daily  . feeding supplement (ENSURE ENLIVE)  237 mL Oral BID BM  . fentaNYL (SUBLIMAZE) injection  50 mcg Intravenous Once  . hydrALAZINE  25 mg Oral Q8H  . insulin aspart  0-15 Units Subcutaneous TID WC  . isosorbide mononitrate  30 mg Oral Daily  . pantoprazole (PROTONIX) IV  40 mg Intravenous Q12H  . sodium chloride  flush  10-40 mL Intracatheter Q12H  . sodium chloride flush  3 mL Intravenous Q12H  . torsemide  40 mg Oral Daily   Continuous Infusions: . sodium chloride Stopped (04/19/17 1200)  . sodium chloride Stopped (04/21/17 1500)  . sodium chloride    . sodium chloride 10 mL/hr at 05/03/17 0800  . amiodarone 60 mg/hr (05/03/17 0800)  . lactated ringers Stopped (05/01/17 0810)  . milrinone 0.125 mcg/kg/min (05/03/17 0800)  . phenylephrine (NEO-SYNEPHRINE) Adult infusion Stopped (05/02/17 2000)   PRN Meds:.albuterol, docusate, hydrALAZINE, neomycin-bacitracin-polymyxin, ondansetron (ZOFRAN) IV, oxyCODONE, phenol, sodium chloride flush, sodium chloride flush, traMADol  General appearance: alert and cooperative Neurologic: intact Heart: regular rate and rhythm, S1, S2 normal, no murmur, click, rub or gallop Lungs: diminished breath sounds bibasilar Abdomen: soft, non-tender; bowel sounds normal; no masses,  no organomegaly Extremities: extremities normal, atraumatic, no cyanosis or edema and Homans sign is negative, no sign of DVT Wound: intact  Lab Results: CBC: Recent Labs  05/02/17 1535 05/02/17 1628 05/03/17 0300  WBC 8.9  --  7.7  HGB 7.3* 7.1* 7.1*  HCT 21.7* 21.0* 21.1*  PLT 213  --  186   BMET:  Recent Labs  05/02/17 0346 05/02/17 1628 05/03/17 0300  NA 133* 135 133*  K 3.8 4.2 4.2  CL 105 101  104  CO2 21*  --  22  GLUCOSE 134* 230* 201*  BUN 89* 84* 83*  CREATININE 2.26* 1.80* 2.02*  CALCIUM 7.7*  --  7.6*    CMET: Lab Results  Component Value Date   WBC 7.7 05/03/2017   HGB 7.1 (L) 05/03/2017   HCT 21.1 (L) 05/03/2017   PLT 186 05/03/2017   GLUCOSE 201 (H) 05/03/2017   CHOL 79 04/12/2017   TRIG 38 04/28/2017   HDL 23 (L) 04/12/2017   LDLCALC 43 04/12/2017   ALT 8 (L) 04/28/2017   AST 16 04/28/2017   NA 133 (L) 05/03/2017   K 4.2 05/03/2017   CL 104 05/03/2017   CREATININE 2.02 (H) 05/03/2017   BUN 83 (H) 05/03/2017   CO2 22 05/03/2017   TSH 4.928  (H) 04/12/2017   PSA 0.17 03/03/2017   INR 3.58 05/03/2017   HGBA1C 5.9 (H) 04/06/2017      PT/INR:  Recent Labs  05/03/17 0300  LABPROT 36.6*  INR 3.58   Radiology: No results found.   Assessment/Plan: S/P Procedure(s) (LRB): MEDIASTINAL EXPLORATION (N/A) Procedure: Insertion of rightfemoral arterial line Redo Median Sternotomy Extracorporeal circulation Implantation of Heartware HVADLeft Ventricular Assist Device. reanl function improved, but bun/cr =41 likely due to blood in gi tract  Blood loss anemia with GI Bleed-  2 units ordered transfused getting  first now One large bm during night, none this am    INR 3.58- coumadin on hold     James [name] 05/03/2017 9:16 AM

## 2017-05-03 NOTE — Progress Notes (Signed)
VAD coordinator informed of Hgb of 5.9 and questionable bloody bowel movements this am. All other vital signs stable. Orders to infuse 2 units of PRBC's and sent occult blood sample per Dr. Gala RomneyBensimhon. Will implement orders and continue to monitor.

## 2017-05-03 NOTE — Progress Notes (Signed)
Patient ID: James FabianHarry D [name], male   DOB: [birth year], 56 y.o.   MRN: [id number]   Advanced Heart Failure VAD Team Note  Subjective:    HVAD placed 6/28.  Returned to the OR that evening with high chest tube output, evacuation of mediastinal hematoma.   Extubated 6/29. Milrinone stopped on 7/4.  Evening of 7/4, patient developed ileus with respiratory compromise. NGT placed with 3 L suctioned out.  CXR with suspicion for aspiration PNA.  Patient had to be intubated.  He went into atrial fibrillation with RVR.  He became hypotensive and was started on norepinephrine and phenylephrine.  Amiodarone gtt begun.     Extubated again on 7/6.   On 7/13 Had 2 sustained episodes of VT. Had to be cardioverted 1. Second episode broke with overdrive pacing with Dr. Graciela HusbandsKlein. Vad speed turned down to 2700. Overnight had 2 more large bouts of melena. This a.m. hemoglobin was 7.1 and he received 2 units of red blood cells. CVP now 5. Feels weak but otherwise doing okay. Denies orthopnea or PND. Mild dyspnea on exertion. He remains hesitant to walk again as he feels this triggered his VT yesterday.  HVAD INTERROGATION:  HVAD:  Flow 5.3 liters/min, speed 2700, power 4.3W,  Peak 8.1 Trough 3.1 Suction off. Lavare On  Objective:    Vital Signs:   Temp:  [97.5 F (36.4 C)-99.3 F (37.4 C)] 98.3 F (36.8 C) (07/14 1245) Pulse Rate:  [69-85] 73 (07/14 1400) Resp:  [17-32] 24 (07/14 1400) BP: (85-123)/(56-89) 107/81 (07/14 1400) SpO2:  [93 %-100 %] 93 % (07/14 1400) Weight:  [101.7 kg (224 lb 3.2 oz)] 101.7 kg (224 lb 3.2 oz) (07/14 0600) Last BM Date: 05/02/17 Mean arterial Pressure 80-90s  Intake/Output:   Intake/Output Summary (Last 24 hours) at 05/03/17 1508 Last data filed at 05/03/17 1400  Gross per 24 hour  Intake          2651.48 ml  Output             2475 ml  Net           176.48 ml     Physical Exam    General:  Lying in bed. Looks weak. NAD.  HEENT: normal. Poor dentition.  Neck: supple.  JVP not elevated.  No lymphadenopathy or thryomegaly appreciated. Cor: LVAD hum.  Lungs: Clear anteriorly  Abdomen: soft, nontender, non-distended. No hepatosplenomegaly. No bruits or masses. Good bowel sounds. Driveline site clean. Anchor in place.  Extremities: no cyanosis, clubbing, rash. UNNA boots  2+ edema above UNNA boots Neuro: alert & oriented x 3. No focal deficits. Moves all 4 without problem  Flat affect     Telemetry   Sinus 70s. No recurrent VT overnight. Personally reviewed    Labs   Basic Metabolic Panel:  Recent Labs Lab 04/26/17 1739 04/27/17 0421 04/28/17 0334 04/29/17 0600 04/30/17 0315 05/01/17 0323 05/02/17 0346 05/02/17 1628 05/03/17 0300  NA 129* 130* 130* 128* 129* 131* 133* 135 133*  K 3.5 4.0 3.7 3.9 3.8 3.9 3.8 4.2 4.2  CL 99* 99* 101 98* 99* 101 105 101 104  CO2 19* 21* 20* 21* 21* 21* 21*  --  22  GLUCOSE 194* 162* 154* 165* 144* 151* 134* 230* 201*  BUN 72* 74* 80* 86* 88* 91* 89* 84* 83*  CREATININE 3.33* 3.20* 3.20* 3.16* 3.00* 2.59* 2.26* 1.80* 2.02*  CALCIUM 7.9* 8.1* 7.7* 7.7* 7.8* 7.7* 7.7*  --  7.6*  MG 2.1 1.9 2.1  --  1.9  --   --   --  1.9  PHOS  --  3.9 3.0  --   --   --   --   --   --     Liver Function Tests:  Recent Labs Lab 04/28/17 0334  AST 16  ALT 8*  ALKPHOS 104  BILITOT 1.0  PROT 4.9*  ALBUMIN 2.2*   No results for input(s): LIPASE, AMYLASE in the last 168 hours. No results for input(s): AMMONIA in the last 168 hours.  CBC:  Recent Labs Lab 04/28/17 0334  04/30/17 0315 05/01/17 0323 05/02/17 0346 05/02/17 1535 05/02/17 1628 05/03/17 0300  WBC 17.5*  < > 11.5* 9.8 7.5 8.9  --  7.7  NEUTROABS 14.7*  --   --   --   --  7.2  --   --   HGB 7.8*  < > 8.4* 7.7* 5.9* 7.3* 7.1* 7.1*  HCT 23.6*  < > 25.1* 22.7* 18.0* 21.7* 21.0* 21.1*  MCV 87.7  < > 88.1 87.6 88.7 87.1  --  86.1  PLT 322  < > 282 284 227 213  --  186  < > = values in this interval not displayed.  INR:  Recent Labs Lab 04/29/17 0320  04/30/17 0315 05/01/17 0323 05/02/17 0346 05/03/17 0300  INR 2.19 2.09 2.28 2.95 3.58    Other results:     Imaging   Dg Chest Port 1 View  Result Date: 05/02/2017 CLINICAL DATA:  Chest pain EXAM: PORTABLE CHEST 1 VIEW COMPARISON:  May 01, 2017 FINDINGS: There is slight atelectasis in the bases. Lungs elsewhere are clear. Central catheter tip is in the superior vena cava. Pacemaker lead is attached to the right ventricle. There is a left ventricular assist device, incompletely visualized. There is cardiomegaly with pulmonary venous hypertension. No adenopathy. There is degenerative change in the left shoulder with superior migration of the left humeral head. IMPRESSION: Bibasilar atelectasis. No edema or consolidation. Stable cardiomegaly with pulmonary vascular congestion. No pneumothorax. There is superior migration of the left humeral head, a finding indicative of chronic rotator cuff tear. Electronically Signed   By: Bretta Bang III M.D.   On: 05/02/2017 08:17     Medications:     Scheduled Medications: . Chlorhexidine Gluconate Cloth  6 each Topical Daily  . febuxostat  40 mg Oral Daily  . feeding supplement (ENSURE ENLIVE)  237 mL Oral BID BM  . fentaNYL (SUBLIMAZE) injection  50 mcg Intravenous Once  . hydrALAZINE  25 mg Oral Q8H  . insulin aspart  0-15 Units Subcutaneous TID WC  . isosorbide mononitrate  30 mg Oral Daily  . pantoprazole (PROTONIX) IV  40 mg Intravenous Q12H  . sodium chloride flush  10-40 mL Intracatheter Q12H  . sodium chloride flush  3 mL Intravenous Q12H  . torsemide  40 mg Oral Daily    Infusions: . sodium chloride Stopped (04/19/17 1200)  . sodium chloride Stopped (04/21/17 1500)  . sodium chloride    . sodium chloride 10 mL/hr at 05/03/17 1400  . amiodarone 60 mg/hr (05/03/17 1400)  . lactated ringers Stopped (05/01/17 0810)  . milrinone 0.125 mcg/kg/min (05/03/17 1400)  . phenylephrine (NEO-SYNEPHRINE) Adult infusion Stopped  (05/02/17 2000)    PRN Medications: albuterol, docusate, hydrALAZINE, neomycin-bacitracin-polymyxin, ondansetron (ZOFRAN) IV, oxyCODONE, phenol, sodium chloride flush, sodium chloride flush, traMADol   Patient Profile   56 yo with CAD s/p CABG, ischemic cardiomyopathy/chronic systolic CHF, tophaceous gout, and CKD stage 3 was  admitted for diuresis and consideration for LVAD placement. S/p HVAD on 6/28  Assessment/Plan:    1. Acute/chronic systolic CHF s/p HVAD: Ischemic cardiomyopathy.  [employer] ICD.  Echo (6/18) with EF 15%, mildly dilated RV with moderately decreased systolic function.  - s/p HVAD placement 6/28 and had to return to OR to evacuate mediastinal hematoma.  - He had been weaned off pressors and milrinone, but developed ileus and likely aspiration event 7/4, re-intubated, developed afib/RVR, and now back on norepinephrine (phenylephrine stopped).  On 7/6, he was extubated.  - Has had rocky course over past 24 hours with recurrent gi bleeding and 2 episodes VT. Despite this. Remains stable from cardiac standpoint - Co-ox 66%  Continue milrinone 0.125 mcg.   - CVP low. Will hold torsemide today with possible VT-related suction event yesterday - VAD speed turned down to 2700. Will continue for now given GI bleeding. VAD parameters stable - Maps 80s. Continue hydralazine 25 tid and and imdur to 30 mg daily.  - Off ASA and coumadin with GI bleed. INR 3.6.  - No dig/spir/arb with elevated creatinine.      2. AKI on CKD stage 3: Suspect a component of peri-op ATN, worsened with ileus, vomiting, intubation/sedation.   -Creatinine trending down 2.02  Keep Map >70 and <90 -Hold diuretics today  3. Symptomatic anemia due top acute UGI bleeding: - Having ongoing melena. Have transfused 2 units of RBCs this morning. I suspect he will need more. We'll try to keep hemoglobin greater than 8 g/dL. - Continue IV Protonix - Aspirin and Coumadin on hold. INR 3.6 today. - GI has seen.  Planning for endoscopy on Monday. Hopefully INR will be trending down further.  4. Ventricular tachycardia - Status post ventricular tachycardia 2 on 7/13. Possible suction event. - Remains on IV amiodarone. We will keep at 60 mg/h for now per EP recommendations. - Vad speed turned down to 2700 - Keep k and mag supplemented.  5. CAD: s/p CABG 2012.   - No signs and symptoms of ischemia. Off aspirin due to GI bleeding.  5. Gout: Severe tophaceous gout. No complaint of gouty pain today.  - Continue uloric. Holding colchicine with AKI.      6. Atrial fibrillation: Developed atrial fibrillation with RVR in setting of aspiration PNA on 7/4.   - On IV amiodarone due to DVT.   7. Malnutrition:  - Now off TPN. Appetite is improving. Encouraged po intake  8. Aspiration PNA with acute hypoxemic respiratory failure on 7/4: He was extubated on 7/6.  - Tracheal aspirate with Klebsiella and citrobacter. Both sensitive to Zosyn.  -Status and completed 7/13    9. Ileus - Resolved  10. Deconditioning: - PT following. CIR following.   I reviewed the HVAD parameters from today, and compared the results to the patient's prior recorded data.  No programming changes were made.  The HVAD is functioning within specified parameters.     Arvilla Meres, MD 05/03/2017, 3:08 PM  VAD Team --- VAD ISSUES ONLY--- Pager [phone] (7am - 7am) Advanced Heart Failure Team  Pager [phone] (M-F; 7a - 4p)  Please contact [facility] for night-coverage after hours (4p -7a ) and weekends on [email or web address]

## 2017-05-03 NOTE — Plan of Care (Signed)
Problem: Bowel/Gastric: Goal: Will not experience complications related to bowel motility Outcome: Not Progressing Patient with 1 large bloody/black bowel movement over night. Hgb 7.1 this morning.  Problem: Cardiac: Goal: Ability to achieve and maintain adequate cardiopulmonary perfusion will improve Outcome: Progressing Patient maintained sinus rhythm in the 70s-80s all night on 60 mg/hr of Amio.  Problem: Respiratory: Goal: Ability to maintain adequate ventilation will improve Outcome: Completed/Met Date Met: 05/03/17 Patient tolerating room air appropriately with O2 sats in the upper 90s.

## 2017-05-03 NOTE — Progress Notes (Signed)
Daily Rounding Note  05/03/2017, 10:28 AM  LOS: 22 days   SUBJECTIVE:   Chief complaint:     VT overnight, pt rather unaware.  Started on amio Has had 2 bloody stools today since MN.  Warfarin on hold. Denies abd pain Feels weak, but no nausea or vomiting   OBJECTIVE:         Vital signs in last 24 hours:    Temp:  [97.5 F (36.4 C)-99.3 F (37.4 C)] 97.5 F (36.4 C) (07/14 1015) Pulse Rate:  [53-90] 80 (07/14 1000) Resp:  [17-33] 28 (07/14 1015) BP: (85-123)/(56-98) 102/85 (07/14 1000) SpO2:  [97 %-100 %] 100 % (07/14 1000) Weight:  [101.7 kg (224 lb 3.2 oz)] 101.7 kg (224 lb 3.2 oz) (07/14 0600) Last BM Date: 05/02/17 Filed Weights   05/01/17 0530 05/02/17 0500 05/03/17 0600  Weight: 102.5 kg (225 lb 14.4 oz) 101.8 kg (224 lb 8 oz) 101.7 kg (224 lb 3.2 oz)   General: sitting in chair, chronically ill appearing   Heart: hum from LVAD Chest: clear ant Abdomen: soft, nondistended, +BS + LVAD hum Extremities: 2+ LE edema Neuro/Psych:  Alert and oriented fully, moves all 4 extrem, flat affect  Intake/Output from previous day: 07/13 0701 - 07/14 0700 In: 1721.9 [P.O.:420; I.V.:956.9; Blood:295; IV Piggyback:50] Out: 2726 [Urine:2725; Stool:1]  Intake/Output this shift: Total I/O In: 546.9 [P.O.:120; I.V.:46.9; Blood:380] Out: -   Lab Results:  Recent Labs  05/02/17 0346 05/02/17 1535 05/02/17 1628 05/03/17 0300  WBC 7.5 8.9  --  7.7  HGB 5.9* 7.3* 7.1* 7.1*  HCT 18.0* 21.7* 21.0* 21.1*  PLT 227 213  --  186   BMET  Recent Labs  05/01/17 0323 05/02/17 0346 05/02/17 1628 05/03/17 0300  NA 131* 133* 135 133*  K 3.9 3.8 4.2 4.2  CL 101 105 101 104  CO2 21* 21*  --  22  GLUCOSE 151* 134* 230* 201*  BUN 91* 89* 84* 83*  CREATININE 2.59* 2.26* 1.80* 2.02*  CALCIUM 7.7* 7.7*  --  7.6*   LFT No results for input(s): PROT, ALBUMIN, AST, ALT, ALKPHOS, BILITOT,  BILIDIR, IBILI in the last 72 hours. PT/INR  Recent Labs  05/02/17 0346 05/03/17 0300  LABPROT 31.3* 36.6*  INR 2.95 3.58   Hepatitis Panel No results for input(s): HEPBSAG, HCVAB, HEPAIGM, HEPBIGM in the last 72 hours.  Studies/Results: Dg Chest Port 1 View  Result Date: 05/02/2017 CLINICAL DATA:  Chest pain EXAM: PORTABLE CHEST 1 VIEW COMPARISON:  May 01, 2017 FINDINGS: There is slight atelectasis in the bases. Lungs elsewhere are clear. Central catheter tip is in the superior vena cava. Pacemaker lead is attached to the right ventricle. There is a left ventricular assist device, incompletely visualized. There is cardiomegaly with pulmonary venous hypertension. No adenopathy. There is degenerative change in the left shoulder with superior migration of the left humeral head. IMPRESSION: Bibasilar atelectasis. No edema or consolidation. Stable cardiomegaly with pulmonary vascular congestion. No pneumothorax. There is superior migration of the left humeral head, a finding indicative of chronic rotator cuff tear. Electronically Signed   By: Bretta BangWilliam  Woodruff III M.D.  On: 05/02/2017 08:17    ASSESMENT:     *  Acute on chronic anemia in pt s/p 6/28 LVAD placement. Hgb drop of nearly 2 grams in 24 hours, 3 grams in last 72 hours.   The amount of slightly bloody stool and slight amount nasal bleeding described do not correlate with the larger degree of Hgb decline.  It would be early for him to have developed post VAD gastro-intestinal AVMs.   Given events of last week, hypotension related gut ischemia is a possibility but for several days did not have bloody stools and has not had any abdominal pain.   Dark emesis last week, possibly from gastritis or PUD.  Currently covered with IV BID Protonix.   Got Feraheme 7/12 and total of 4 units blood, 2 on 7/13.    *  Malnutrition.  Off TPN as of 7/11, fair appetite.  Do not see any nutritional supplement orders.    *  Post op ileus, resolved.   N/V due to ileus resulting in aspiration PNA, sepsis, hypoxic resp failure and reintubation, pressor requiring hypotension: resolved.  On Zosyn.    *  CHF.  VAD placed 6/28.  Remains on Milrinone.    *  Coagulopathy due to Coumadin, on hold but INR rising.     PLAN   *  If INR less than 2, he can proceed to EGD set for Monday 7/16 at 10:15 AM.   *  Following Hgb, transfuse as needed.    Jennye Moccasin  05/03/2017, 10:28 AM Pager: [phone] Isauro Skelley, Carie Caddy, MD

## 2017-05-04 ENCOUNTER — Inpatient Hospital Stay (HOSPITAL_COMMUNITY): Payer: Medicare PPO

## 2017-05-04 DIAGNOSIS — D689 Coagulation defect, unspecified: Secondary | ICD-10-CM

## 2017-05-04 LAB — HEPATIC FUNCTION PANEL
ALK PHOS: 56 U/L (ref 38–126)
ALT: 11 U/L — AB (ref 17–63)
AST: 21 U/L (ref 15–41)
Albumin: 1.7 g/dL — ABNORMAL LOW (ref 3.5–5.0)
BILIRUBIN DIRECT: 0.2 mg/dL (ref 0.1–0.5)
BILIRUBIN TOTAL: 0.7 mg/dL (ref 0.3–1.2)
Indirect Bilirubin: 0.5 mg/dL (ref 0.3–0.9)
Total Protein: 4.2 g/dL — ABNORMAL LOW (ref 6.5–8.1)

## 2017-05-04 LAB — GLUCOSE, CAPILLARY
GLUCOSE-CAPILLARY: 166 mg/dL — AB (ref 65–99)
GLUCOSE-CAPILLARY: 168 mg/dL — AB (ref 65–99)
GLUCOSE-CAPILLARY: 146 mg/dL — AB (ref 65–99)
Glucose-Capillary: 160 mg/dL — ABNORMAL HIGH (ref 65–99)

## 2017-05-04 LAB — BASIC METABOLIC PANEL
ANION GAP: 6 (ref 5–15)
BUN: 78 mg/dL — ABNORMAL HIGH (ref 6–20)
CHLORIDE: 107 mmol/L (ref 101–111)
CO2: 21 mmol/L — AB (ref 22–32)
Calcium: 7.4 mg/dL — ABNORMAL LOW (ref 8.9–10.3)
Creatinine, Ser: 1.92 mg/dL — ABNORMAL HIGH (ref 0.61–1.24)
GFR calc Af Amer: 44 mL/min — ABNORMAL LOW (ref >60)
GFR, EST NON AFRICAN AMERICAN: 38 mL/min — AB (ref >60)
GLUCOSE: 218 mg/dL — AB (ref 65–99)
POTASSIUM: 3.9 mmol/L (ref 3.5–5.1)
Sodium: 134 mmol/L — ABNORMAL LOW (ref 135–145)

## 2017-05-04 LAB — CBC
HEMATOCRIT: 23.1 % — AB (ref 39.0–52.0)
HEMOGLOBIN: 7.9 g/dL — AB (ref 13.0–17.0)
MCH: 29.7 pg (ref 26.0–34.0)
MCHC: 34.2 g/dL (ref 30.0–36.0)
MCV: 86.8 fL (ref 78.0–100.0)
Platelets: 158 10*3/uL (ref 150–400)
RBC: 2.66 MIL/uL — AB (ref 4.22–5.81)
RDW: 18.5 % — ABNORMAL HIGH (ref 11.5–15.5)
WBC: 12.2 10*3/uL — AB (ref 4.0–10.5)

## 2017-05-04 LAB — PREPARE RBC (CROSSMATCH)

## 2017-05-04 LAB — PROTIME-INR
INR: 3.94
PROTHROMBIN TIME: 39.6 s — AB (ref 11.4–15.2)

## 2017-05-04 LAB — COOXEMETRY PANEL
Carboxyhemoglobin: 1.6 % — ABNORMAL HIGH (ref 0.5–1.5)
Methemoglobin: 1.3 % (ref 0.0–1.5)
O2 Saturation: 72.5 %
Total hemoglobin: 7.6 g/dL — ABNORMAL LOW (ref 12.0–16.0)

## 2017-05-04 LAB — LACTATE DEHYDROGENASE: LDH: 161 U/L (ref 98–192)

## 2017-05-04 MED ORDER — FUROSEMIDE 10 MG/ML IJ SOLN
20.0000 mg | Freq: Once | INTRAMUSCULAR | Status: AC
  Administered 2017-05-04: 20 mg via INTRAVENOUS
  Filled 2017-05-04: qty 2

## 2017-05-04 MED ORDER — SODIUM CHLORIDE 0.9 % IV SOLN
Freq: Once | INTRAVENOUS | Status: AC
Start: 2017-05-04 — End: 2017-05-04
  Administered 2017-05-04: 15:00:00 via INTRAVENOUS

## 2017-05-04 MED ORDER — AMIODARONE HCL 200 MG PO TABS
400.0000 mg | ORAL_TABLET | Freq: Two times a day (BID) | ORAL | Status: DC
  Administered 2017-05-04 – 2017-05-05 (×3): 400 mg via ORAL
  Filled 2017-05-04 (×3): qty 2

## 2017-05-04 NOTE — Progress Notes (Signed)
Patient ID: James [name], male   DOB: 1961-04-16, 56 y.o.   MRN: [id number]   Advanced Heart Failure VAD Team Note  Subjective:    HVAD placed 6/28.  Returned to the OR that evening with high chest tube output, evacuation of mediastinal hematoma.   Extubated 6/29. Milrinone stopped on 7/4.  Evening of 7/4, patient developed ileus with respiratory compromise. NGT placed with 3 L suctioned out.  CXR with suspicion for aspiration PNA.  Patient had to be intubated.  He went into atrial fibrillation with RVR.  He became hypotensive and was started on norepinephrine and phenylephrine.  Amiodarone gtt begun.     Extubated again on 7/6.   On 7/13 Had 2 sustained episodes of VT. Had to be cardioverted 1. Second episode broke with overdrive pacing with Dr. Graciela HusbandsKlein. Vad speed turned down to 2700.  Overnight had several more bouts of melena. Seems to have slowed this am. Received 2 more units of blood yesterday am.  Hemoglobin 9.1->7.8-> 7.9  Diuretics on hold due to low CVP. Weight stable. Co-ox 72% on milrinone 0.125. Creatinine continues to improve. Now down to 1.9. INR remains at 3.9.   Feels weak but otherwise doing okay. Denies orthopnea or PND. Was able to walk hall this am. Mild dyspnea on exertion. No further VT.   HVAD INTERROGATION:  HVAD:  Flow 4.9 liters/min, speed 2700, power 4.2W,  Peak 7.9 Trough 3.2 Suction off. Lavare On  Objective:    Vital Signs:   Temp:  [98.3 F (36.8 C)-99.7 F (37.6 C)] 98.9 F (37.2 C) (07/15 0809) Pulse Rate:  [62-83] 75 (07/15 1100) Resp:  [22-32] 25 (07/15 1100) BP: (85-110)/(68-88) 100/82 (07/15 1100) SpO2:  [93 %-100 %] 100 % (07/15 1100) Weight:  [102.3 kg (225 lb 8.5 oz)] 102.3 kg (225 lb 8.5 oz) (07/15 0500) Last BM Date: 05/04/17 Mean arterial Pressure 80-90s  Intake/Output:   Intake/Output Summary (Last 24 hours) at 05/04/17 1135 Last data filed at 05/04/17 1100  Gross per 24 hour  Intake           2347.2 ml  Output             1001  ml  Net           1346.2 ml     Physical Exam   CVP  5 General:  Lying in bed. Weak but fully conversant. NAD.  HEENT: normal  Neck: supple. JVP not elevated.  Carotids 2+ bilat; no bruits. No lymphadenopathy or thryomegaly appreciated. Cor: LVAD hum.  Lungs: Clear decreased in bases Abdomen: soft, nontender, non-distended. No hepatosplenomegaly. No bruits or masses. Good bowel sounds. Driveline site clean. Anchor in place.  Extremities: no cyanosis, clubbing, rash. + UNNA boots. 2+ edema over boots. Severe gouty changes Neuro: alert & oriented x 3. No focal deficits. Moves all 4 without problem  Affect flat     Telemetry   Sinus 70s. No recurrent VT  Personally reviewed    Labs   Basic Metabolic Panel:  Recent Labs Lab 04/28/17 0334  04/30/17 0315 05/01/17 0323 05/02/17 0346 05/02/17 1628 05/03/17 0300 05/04/17 0355  NA 130*  < > 129* 131* 133* 135 133* 134*  K 3.7  < > 3.8 3.9 3.8 4.2 4.2 3.9  CL 101  < > 99* 101 105 101 104 107  CO2 20*  < > 21* 21* 21*  --  22 21*  GLUCOSE 154*  < > 144* 151* 134* 230* 201* 218*  BUN 80*  < > 88* 91* 89* 84* 83* 78*  CREATININE 3.20*  < > 3.00* 2.59* 2.26* 1.80* 2.02* 1.92*  CALCIUM 7.7*  < > 7.8* 7.7* 7.7*  --  7.6* 7.4*  MG 2.1  --  1.9  --   --   --  1.9  --   PHOS 3.0  --   --   --   --   --   --   --   < > = values in this interval not displayed.  Liver Function Tests:  Recent Labs Lab 04/28/17 0334 05/04/17 0355  AST 16 21  ALT 8* 11*  ALKPHOS 104 56  BILITOT 1.0 0.7  PROT 4.9* 4.2*  ALBUMIN 2.2* 1.7*   No results for input(s): LIPASE, AMYLASE in the last 168 hours. No results for input(s): AMMONIA in the last 168 hours.  CBC:  Recent Labs Lab 04/28/17 0334  05/02/17 1535 05/02/17 1628 05/03/17 0300 05/03/17 1500 05/03/17 2151 05/04/17 0355  WBC 17.5*  < > 8.9  --  7.7 11.8* 13.2* 12.2*  NEUTROABS 14.7*  --  7.2  --   --   --   --   --   HGB 7.8*  < > 7.3* 7.1* 7.1* 9.1* 7.8* 7.9*  HCT 23.6*  < >  21.7* 21.0* 21.1* 26.3* 22.9* 23.1*  MCV 87.7  < > 87.1  --  86.1 86.2 87.7 86.8  PLT 322  < > 213  --  186 190 185 158  < > = values in this interval not displayed.  INR:  Recent Labs Lab 04/30/17 0315 05/01/17 0323 05/02/17 0346 05/03/17 0300 05/04/17 0355  INR 2.09 2.28 2.95 3.58 3.94    Other results:     Imaging   Dg Chest Port 1 View  Result Date: 05/04/2017 CLINICAL DATA:  Followup bibasilar atelectasis. EXAM: PORTABLE CHEST 1 VIEW COMPARISON:  05/02/2017. FINDINGS: Poor inspiration. Stable enlarged cardiac silhouette and post CABG changes. The left ventricular assist device is unchanged as is a left subclavian AICD lead and right PICC. Clear lungs. Right shoulder fixation anchors. IMPRESSION: No acute abnormality. Electronically Signed   By: Beckie Salts M.D.   On: 05/04/2017 07:39     Medications:     Scheduled Medications: . amiodarone  400 mg Oral BID  . Chlorhexidine Gluconate Cloth  6 each Topical Daily  . febuxostat  40 mg Oral Daily  . feeding supplement (ENSURE ENLIVE)  237 mL Oral BID BM  . fentaNYL (SUBLIMAZE) injection  50 mcg Intravenous Once  . hydrALAZINE  25 mg Oral Q8H  . insulin aspart  0-15 Units Subcutaneous TID WC  . isosorbide mononitrate  30 mg Oral Daily  . pantoprazole (PROTONIX) IV  40 mg Intravenous Q12H  . sodium chloride flush  10-40 mL Intracatheter Q12H  . sodium chloride flush  3 mL Intravenous Q12H  . torsemide  40 mg Oral Daily    Infusions: . sodium chloride Stopped (04/19/17 1200)  . sodium chloride Stopped (04/21/17 1500)  . sodium chloride Stopped (05/02/17 0630)  . sodium chloride 5 mL/hr at 05/04/17 1100  . lactated ringers Stopped (05/01/17 0810)  . milrinone 0.125 mcg/kg/min (05/04/17 1100)  . phenylephrine (NEO-SYNEPHRINE) Adult infusion Stopped (05/02/17 2000)    PRN Medications: albuterol, docusate, hydrALAZINE, neomycin-bacitracin-polymyxin, ondansetron (ZOFRAN) IV, oxyCODONE, phenol, sodium chloride  flush, sodium chloride flush, traMADol   Patient Profile   56 yo with CAD s/p CABG, ischemic cardiomyopathy/chronic systolic CHF, tophaceous gout, and  CKD stage 3 was admitted for diuresis and consideration for LVAD placement. S/p HVAD on 6/28  Assessment/Plan:    1. Acute/chronic systolic CHF s/p HVAD: Ischemic cardiomyopathy.  [employer] Jude ICD.  Echo (6/18) with EF 15%, mildly dilated RV with moderately decreased systolic function.  - s/p HVAD placement 6/28 and had to return to OR to evacuate mediastinal hematoma.  - He had been weaned off pressors/milrinone, but developed ileus w/ likely aspiration event 7/4, re-intubated, developed afib/RVR requiring norepinephrine. Extubated 7/6 - Continues to improve from HF standpoint. Has LE edema but CVP remains low. Weight stable. Diuretics on hold due to recent suction events/VT. Will give one dose IV lasix with blood today - VAD speed turned down to 2700 with recent VT. VAD parameters currently look good. - Maps 80-90. Continue hydralazine 25 tid and and imdur to 30 mg daily. Would not push hard with ongoing bleeding - Off ASA and coumadin with GI bleed. INR 3.6.  - No dig/spir/arb with elevated creatinine.     2. AKI on CKD stage 3: Suspect a component of peri-op ATN, worsened with ileus, vomiting, intubation/sedation.   -Creatinine trending down 1.9 today  Keep Map >70 and <90 -Will give one dose lasix today with RBCs  3. Symptomatic anemia due top acute UGI bleeding: - Having ongoing melena but seems to be leveling out. Hgb 7.9 today. Will give 1u RBCs.  - INR remains elevated 3.6-> 3.9 off coumadin. D/w PharmD. - Discussed with GI at bedside this am. Will delay EGD until midweek when INR down  - Continue IV Protonix - Aspirin and Coumadin on hold. INR 3.9 today.  4. Ventricular tachycardia - Status post ventricular tachycardia 2 on 7/13. Possible suction event. - Remains on IV amiodarone. D/w Dr. Graciela Husbands. Will switch to po today.  - Vad  speed turned down to 2700 - Keep k and mag supplemented.  5. CAD: s/p CABG 2012.   - No s/s of ischemia. Off aspirin due to GI bleeding.  5. Gout: Severe tophaceous gout. No complaint of gouty pain today.  - Continue uloric. Holding colchicine with AKI.      6. Atrial fibrillation: Developed atrial fibrillation with RVR in setting of aspiration PNA on 7/4.   - On IV amiodarone due to Vt. Switching to po today   7. Malnutrition:  - Now off TPN. Appetite is improving. Encouraged po intake  8. Aspiration PNA with acute hypoxemic respiratory failure on 7/4: He was extubated on 7/6.  - Tracheal aspirate with Klebsiella and citrobacter. Both sensitive to Zosyn.  -Status and completed 7/13    9. Ileus - Resolved  10. Deconditioning: - PT following. CIR following. Continue ambulation in halls. Continue IS.   I reviewed the HVAD parameters from today, and compared the results to the patient's prior recorded data.  No programming changes were made.  The HVAD is functioning within specified parameters.     Arvilla Meres, MD 05/04/2017, 11:35 AM  VAD Team --- VAD ISSUES ONLY--- Pager [phone] (7am - 7am) Advanced Heart Failure Team  Pager [phone] (M-F; 7a - 4p)  Please contact [facility] for night-coverage after hours (4p -7a ) and weekends on [email or web address]

## 2017-05-04 NOTE — Progress Notes (Signed)
ANTICOAGULATION CONSULT NOTE - Follow Up Consult  Pharmacy Consult for Warfarin > holding, Heparin when INR < 1.8 Indication: HVAD  No Active Allergies  Patient Measurements: Height: 6' (182.9 cm) Weight: 225 lb 8.5 oz (102.3 kg) IBW/kg (Calculated) : 77.6  Vital Signs: Temp: 98.3 F (36.8 C) (07/15 1232) Temp Source: Oral (07/15 1232) BP: 105/82 (07/15 1232) Pulse Rate: 74 (07/15 1232)  Labs:  Recent Labs  05/02/17 0346  05/02/17 1628 05/03/17 0300 05/03/17 1500 05/03/17 2151 05/04/17 0355  HGB 5.9*  < > 7.1* 7.1* 9.1* 7.8* 7.9*  HCT 18.0*  < > 21.0* 21.1* 26.3* 22.9* 23.1*  PLT 227  < >  --  186 190 185 158  LABPROT 31.3*  --   --  36.6*  --   --  39.6*  INR 2.95  --   --  3.58  --   --  3.94  CREATININE 2.26*  --  1.80* 2.02*  --   --  1.92*  < > = values in this interval not displayed.  Estimated Creatinine Clearance: 54.4 mL/min (A) (by C-G formula based on SCr of 1.92 mg/dL (H)).   Assessment: 56yom s/p HVAD 6/28, started on coumadin per MD 6/30. He developed an ileus on 7/5, warfarin was held, and heparin bridge started once INR fell below 1.8.  Heparin stopped 7/10 with INR 2.19, Hgb down to 5.9 and he is having nosebleeds and bloody stools.  Plan for EGD when INR < 1.5 but INR rising 3.9 despite no warfarin since 7/12.  LFT wnl, continues on amiodarone.    Plan to hold warfarin and aspirin for now, transfuse and start heparin when INR < 1.8.  Goal of Therapy:  Heparin level 0.3-0.5 INR goal 2-3 Monitor platelets by anticoagulation protocol: Yes   Plan:  1) Hold warfarin and aspirin 2) Daily INR - start heparin when < 1.8, aim for lower goal  Leota SauersLisa Tausha Milhoan Pharm.D. CPP, BCPS Clinical Pharmacist [phone] 05/04/2017 12:39 PM

## 2017-05-04 NOTE — Progress Notes (Signed)
Patient ID: James FabianHarry D [name], male   DOB: November 10, 1960, 56 y.o.   MRN: [id number] [facility] DAILY ICU PROGRESS NOTE                   [address]            [place], [id number]          [phone]   17 Days Post-Op Procedure(s) (LRB): MEDIASTINAL EXPLORATION (N/A) Procedure: 1. Insertion of rightfemoral arterial line 2. Redo Median Sternotomy 3. Extracorporeal circulation 4. Implantation of Heartware HVADLeft Ventricular Assist Device.  Total Length of Stay:  LOS: 23 days   Subjective: Up in chair eating, some bloody stool last pm  Objective: Vital signs in last 24 hours: Temp:  [97.5 F (36.4 C)-99.7 F (37.6 C)] 98.9 F (37.2 C) (07/15 0809) Pulse Rate:  [62-83] 81 (07/15 0809) Cardiac Rhythm: Normal sinus rhythm;Heart block (07/15 0800) Resp:  [22-32] 27 (07/15 0809) BP: (85-110)/(68-88) 107/88 (07/15 0800) SpO2:  [93 %-100 %] 100 % (07/15 0809) Weight:  [225 lb 8.5 oz (102.3 kg)] 225 lb 8.5 oz (102.3 kg) (07/15 0500)  Filed Weights   05/02/17 0500 05/03/17 0600 05/04/17 0500  Weight: 224 lb 8 oz (101.8 kg) 224 lb 3.2 oz (101.7 kg) 225 lb 8.5 oz (102.3 kg)    Weight change: 1 lb 5.3 oz (0.604 kg)   Hemodynamic parameters for last 24 hours: CVP:  [2 mmHg-22 mmHg] 2 mmHg  Intake/Output from previous day: 07/14 0701 - 07/15 0700 In: 2790.2 [P.O.:600; I.V.:1095.2; Blood:1095] Out: 1351 [Urine:1350; Stool:1]  Intake/Output this shift: Total I/O In: 8.8 [I.V.:8.8] Out: -   Current Meds: Scheduled Meds: . amiodarone  400 mg Oral BID  . Chlorhexidine Gluconate Cloth  6 each Topical Daily  . febuxostat  40 mg Oral Daily  . feeding supplement (ENSURE ENLIVE)  237 mL Oral BID BM  . fentaNYL (SUBLIMAZE) injection  50 mcg Intravenous Once  . hydrALAZINE  25 mg Oral Q8H  . insulin aspart  0-15 Units Subcutaneous TID WC  . isosorbide mononitrate  30 mg Oral Daily  . pantoprazole (PROTONIX) IV  40 mg Intravenous Q12H  . sodium chloride flush  10-40 mL  Intracatheter Q12H  . sodium chloride flush  3 mL Intravenous Q12H  . torsemide  40 mg Oral Daily   Continuous Infusions: . sodium chloride Stopped (04/19/17 1200)  . sodium chloride Stopped (04/21/17 1500)  . sodium chloride Stopped (05/02/17 0630)  . sodium chloride 5 mL/hr at 05/04/17 0800  . lactated ringers Stopped (05/01/17 0810)  . milrinone 0.125 mcg/kg/min (05/04/17 0800)  . phenylephrine (NEO-SYNEPHRINE) Adult infusion Stopped (05/02/17 2000)   PRN Meds:.albuterol, docusate, hydrALAZINE, neomycin-bacitracin-polymyxin, ondansetron (ZOFRAN) IV, oxyCODONE, phenol, sodium chloride flush, sodium chloride flush, traMADol  General appearance: alert and cooperative Neurologic: intact Heart: regular rate and rhythm, S1, S2 normal, no murmur, click, rub or gallop Lungs: diminished breath sounds bibasilar Abdomen: soft, non-tender; bowel sounds normal; no masses,  no organomegaly Extremities: extremities normal, atraumatic, no cyanosis or edema and Homans sign is negative, no sign of DVT Wound: intact  Lab Results: CBC: Recent Labs  05/03/17 2151 05/04/17 0355  WBC 13.2* 12.2*  HGB 7.8* 7.9*  HCT 22.9* 23.1*  PLT 185 158   BMET:  Recent Labs  05/03/17 0300 05/04/17 0355  NA 133* 134*  K 4.2 3.9  CL 104 107  CO2 22 21*  GLUCOSE 201* 218*  BUN 83* 78*  CREATININE 2.02* 1.92*  CALCIUM  7.6* 7.4*    CMET: Lab Results  Component Value Date   WBC 12.2 (H) 05/04/2017   HGB 7.9 (L) 05/04/2017   HCT 23.1 (L) 05/04/2017   PLT 158 05/04/2017   GLUCOSE 218 (H) 05/04/2017   CHOL 79 04/12/2017   TRIG 38 04/28/2017   HDL 23 (L) 04/12/2017   LDLCALC 43 04/12/2017   ALT 11 (L) 05/04/2017   AST 21 05/04/2017   NA 134 (L) 05/04/2017   K 3.9 05/04/2017   CL 107 05/04/2017   CREATININE 1.92 (H) 05/04/2017   BUN 78 (H) 05/04/2017   CO2 21 (L) 05/04/2017   TSH 4.928 (H) 04/12/2017   PSA 0.17 03/03/2017   INR 3.94 05/04/2017   HGBA1C 5.9 (H) 03/22/2017      PT/INR:    Recent Labs  05/04/17 0355  LABPROT 39.6*  INR 3.94   Radiology: Dg Chest Port 1 View  Result Date: 05/04/2017 CLINICAL DATA:  Followup bibasilar atelectasis. EXAM: PORTABLE CHEST 1 VIEW COMPARISON:  05/02/2017. FINDINGS: Poor inspiration. Stable enlarged cardiac silhouette and post CABG changes. The left ventricular assist device is unchanged as is a left subclavian AICD lead and right PICC. Clear lungs. Right shoulder fixation anchors. IMPRESSION: No acute abnormality. Electronically Signed   By: Beckie Salts M.D.   On: 05/04/2017 07:39     Assessment/Plan: S/P Procedure(s) (LRB): MEDIASTINAL EXPLORATION (N/A) Procedure: 4. Insertion of rightfemoral arterial line 5. Redo Median Sternotomy 6. Extracorporeal circulation 4. Implantation of Heartware HVADLeft Ventricular Assist Device.  Required one unit blood transfusion to maintain H/H Renal function improved, no evidence of encephalopathy with increased blood in gi tract   Further work up , coordination anticoagulation gi and HF team - inr 3.94 off coumadin     [name] 05/04/2017 9:43 AM

## 2017-05-04 NOTE — Progress Notes (Signed)
Progress Note  Patient Name: James [name] Date of Encounter: 05/04/2017  Primary Cardiologist: DB  Primary Electrophysiologist: JA   Patient Profile     56 y.o. male ICM s/ VAD with multiple post op complications inclu afib>>amio and then 7/13 acute and sustained VT  Requiring cardioversion and then repeat efforts with ATP - Directed  Programming (65%)  Subjective   Tired this am, did not sleep well  Baseline SOB No chest pain  Inpatient Medications    Scheduled Meds: . amiodarone  400 mg Oral BID  . Chlorhexidine Gluconate Cloth  6 each Topical Daily  . febuxostat  40 mg Oral Daily  . feeding supplement (ENSURE ENLIVE)  237 mL Oral BID BM  . fentaNYL (SUBLIMAZE) injection  50 mcg Intravenous Once  . hydrALAZINE  25 mg Oral Q8H  . insulin aspart  0-15 Units Subcutaneous TID WC  . isosorbide mononitrate  30 mg Oral Daily  . pantoprazole (PROTONIX) IV  40 mg Intravenous Q12H  . sodium chloride flush  10-40 mL Intracatheter Q12H  . sodium chloride flush  3 mL Intravenous Q12H  . torsemide  40 mg Oral Daily   Continuous Infusions: . sodium chloride Stopped (04/19/17 1200)  . sodium chloride Stopped (04/21/17 1500)  . sodium chloride Stopped (05/02/17 0630)  . sodium chloride 5 mL/hr at 05/04/17 0700  . lactated ringers Stopped (05/01/17 0810)  . milrinone 0.125 mcg/kg/min (05/04/17 0759)  . phenylephrine (NEO-SYNEPHRINE) Adult infusion Stopped (05/02/17 2000)   PRN Meds: albuterol, docusate, hydrALAZINE, neomycin-bacitracin-polymyxin, ondansetron (ZOFRAN) IV, oxyCODONE, phenol, sodium chloride flush, sodium chloride flush, traMADol   Vital Signs    Vitals:   05/04/17 0600 05/04/17 0700 05/04/17 0800 05/04/17 0809  BP: 106/78 102/88 107/88   Pulse: 77 80 83 81  Resp: (!) 31 (!) 28 (!) 23 (!) 27  Temp:    98.9 F (37.2 C)  TempSrc:    Oral  SpO2: 97% 99% 100% 100%  Weight:      Height:        Intake/Output Summary (Last 24 hours) at 05/04/17 0824 Last data  filed at 05/04/17 0700  Gross per 24 hour  Intake           2713.3 ml  Output             1351 ml  Net           1362.3 ml   Filed Weights   05/02/17 0500 05/03/17 0600 05/04/17 0500  Weight: 224 lb 8 oz (101.8 kg) 224 lb 3.2 oz (101.7 kg) 225 lb 8.5 oz (102.3 kg)    Telemetry   No Ventricular tachy  A few couplets  Personally Reviewed  ECG     Physical Exam    Well developed and cachectic in no acute distress; affect flat  HENT normal Neck supple    Clear Regular rate and rhythm, no murmurs or gallops Abd-soft with active BS without hepatomegal No Clubbing cyanosis edema Skin-warm and dry A & Oriented  Grossly normal sensory and motor function   Labs    Chemistry Recent Labs Lab 04/28/17 0334  05/02/17 0346 05/02/17 1628 05/03/17 0300 05/04/17 0355  NA 130*  < > 133* 135 133* 134*  K 3.7  < > 3.8 4.2 4.2 3.9  CL 101  < > 105 101 104 107  CO2 20*  < > 21*  --  22 21*  GLUCOSE 154*  < > 134* 230* 201* 218*  BUN  80*  < > 89* 84* 83* 78*  CREATININE 3.20*  < > 2.26* 1.80* 2.02* 1.92*  CALCIUM 7.7*  < > 7.7*  --  7.6* 7.4*  PROT 4.9*  --   --   --   --  4.2*  ALBUMIN 2.2*  --   --   --   --  1.7*  AST 16  --   --   --   --  21  ALT 8*  --   --   --   --  11*  ALKPHOS 104  --   --   --   --  56  BILITOT 1.0  --   --   --   --  0.7  GFRNONAA 20*  < > 31*  --  36* 38*  GFRAA 24*  < > 36*  --  41* 44*  ANIONGAP 9  < > 7  --  7 6  < > = values in this interval not displayed.   Hematology  Recent Labs Lab 05/03/17 1500 05/03/17 2151 05/04/17 0355  WBC 11.8* 13.2* 12.2*  RBC 3.05* 2.61* 2.66*  HGB 9.1* 7.8* 7.9*  HCT 26.3* 22.9* 23.1*  MCV 86.2 87.7 86.8  MCH 29.8 29.9 29.7  MCHC 34.6 34.1 34.2  RDW 17.9* 17.9* 18.5*  PLT 190 185 158    Cardiac EnzymesNo results for input(s): TROPONINI in the last 168 hours. No results for input(s): TROPIPOC in the last 168 hours.   BNP  Recent Labs Lab 05/01/17 0327  BNP 743.4*     DDimer No results for  input(s): DDIMER in the last 168 hours.   Radiology    Dg Chest Port 1 View  Result Date: 05/04/2017 CLINICAL DATA:  Followup bibasilar atelectasis. EXAM: PORTABLE CHEST 1 VIEW COMPARISON:  05/02/2017. FINDINGS: Poor inspiration. Stable enlarged cardiac silhouette and post CABG changes. The left ventricular assist device is unchanged as is a left subclavian AICD lead and right PICC. Clear lungs. Right shoulder fixation anchors. IMPRESSION: No acute abnormality. Electronically Signed   By: Beckie Salts M.D.   On: 05/04/2017 07:39       Device Interrogation        Assessment & Plan    Ventricular tachycardia-recurrent None x 36 hrs   Will transition Amio to PO today When he gets around to discharge, will have to decide whether to enable or not his tachytherapies   Signed, Sherryl Manges, MD  05/04/2017, 8:24 AM

## 2017-05-04 NOTE — Progress Notes (Signed)
   05/04/17 1210  Clinical Encounter Type  Visited With Health care provider  Visit Type Follow-up;Psychological support;Spiritual support;Social support   Chaplain stopping by to check in on patient and any family present. Nurse stated that wife was at bedside and they are currently changing dressings.  Chaplain will check back later.

## 2017-05-04 NOTE — Progress Notes (Signed)
Daily Rounding Note  05/04/2017, 11:21 AM  LOS: 23 days   SUBJECTIVE:   Chief complaint: bloody stools, melenic, x 3 yesterday, 1 at 5AM today.  Able to walk but feels tired.  Wife present in room INR 3.9  OBJECTIVE:         Vital signs in last 24 hours:    Temp:  [98.3 F (36.8 C)-99.7 F (37.6 C)] 98.9 F (37.2 C) (07/15 0809) Pulse Rate:  [62-83] 75 (07/15 1100) Resp:  [22-32] 25 (07/15 1100) BP: (85-110)/(68-88) 100/82 (07/15 1100) SpO2:  [93 %-100 %] 100 % (07/15 1100) Weight:  [102.3 kg (225 lb 8.5 oz)] 102.3 kg (225 lb 8.5 oz) (07/15 0500) Last BM Date: 05/04/17 Filed Weights   05/02/17 0500 05/03/17 0600 05/04/17 0500  Weight: 101.8 kg (224 lb 8 oz) 101.7 kg (224 lb 3.2 oz) 102.3 kg (225 lb 8.5 oz)   General: looks tired   Heart: VAD hum RRR on monitor Chest: clear bil.   Abdomen: soft, VAD driveline in left abdomen  Extremities: edema above Unna boots. Neuro/Psych:  Oriented x 3.  No tremor or gross deficit.  strength not tested.   Intake/Output from previous day: 07/14 0701 - 07/15 0700 In: 2790.2 [P.O.:600; I.V.:1095.2; Blood:1095] Out: 1351 [Urine:1350; Stool:1]  Intake/Output this shift: Total I/O In: 275.2 [P.O.:240; I.V.:35.2] Out: -   Lab Results:  Recent Labs  05/03/17 1500 05/03/17 2151 05/04/17 0355  WBC 11.8* 13.2* 12.2*  HGB 9.1* 7.8* 7.9*  HCT 26.3* 22.9* 23.1*  PLT 190 185 158   BMET  Recent Labs  05/02/17 0346 05/02/17 1628 05/03/17 0300 05/04/17 0355  NA 133* 135 133* 134*  K 3.8 4.2 4.2 3.9  CL 105 101 104 107  CO2 21*  --  22 21*  GLUCOSE 134* 230* 201* 218*  BUN 89* 84* 83* 78*  CREATININE 2.26* 1.80* 2.02* 1.92*  CALCIUM 7.7*  --  7.6* 7.4*   LFT  Recent Labs  05/04/17 0355  PROT 4.2*  ALBUMIN 1.7*  AST 21  ALT 11*  ALKPHOS 56  BILITOT 0.7  BILIDIR 0.2  IBILI 0.5   PT/INR  Recent Labs  05/03/17 0300 05/04/17 0355  LABPROT 36.6* 39.6*    INR 3.58 3.94   Hepatitis Panel No results for input(s): HEPBSAG, HCVAB, HEPAIGM, HEPBIGM in the last 72 hours.  Studies/Results: Dg Chest Port 1 View  Result Date: 05/04/2017 CLINICAL DATA:  Followup bibasilar atelectasis. EXAM: PORTABLE CHEST 1 VIEW COMPARISON:  05/02/2017. FINDINGS: Poor inspiration. Stable enlarged cardiac silhouette and post CABG changes. The left ventricular assist device is unchanged as is a left subclavian AICD lead and right PICC. Clear lungs. Right shoulder fixation anchors. IMPRESSION: No acute abnormality. Electronically Signed   By: Beckie SaltsSteven  Reid M.D.   On: 05/04/2017 07:39   Scheduled Meds: . amiodarone  400 mg Oral BID  . Chlorhexidine Gluconate Cloth  6 each Topical Daily  . febuxostat  40 mg Oral Daily  . feeding supplement (ENSURE ENLIVE)  237 mL Oral BID BM  . fentaNYL (SUBLIMAZE) injection  50 mcg Intravenous Once  . hydrALAZINE  25 mg Oral Q8H  . insulin aspart  0-15 Units Subcutaneous TID WC  . isosorbide mononitrate  30 mg Oral Daily  . pantoprazole (PROTONIX) IV  40 mg Intravenous Q12H  . sodium chloride flush  10-40 mL Intracatheter Q12H  . sodium chloride flush  3 mL Intravenous Q12H  . torsemide  40 mg Oral Daily   Continuous Infusions: . sodium chloride Stopped (04/19/17 1200)  . sodium chloride Stopped (04/21/17 1500)  . sodium chloride Stopped (05/02/17 0630)  . sodium chloride 5 mL/hr at 05/04/17 1100  . lactated ringers Stopped (05/01/17 0810)  . milrinone 0.125 mcg/kg/min (05/04/17 1100)  . phenylephrine (NEO-SYNEPHRINE) Adult infusion Stopped (05/02/17 2000)   PRN Meds:.albuterol, docusate, hydrALAZINE, neomycin-bacitracin-polymyxin, ondansetron (ZOFRAN) IV, oxyCODONE, phenol, sodium chloride flush, sodium chloride flush, traMADol  ASSESMENT:   *  GI bleeding  On empiric BID, IV Protonix.    *  Blood loss anemia.  Total 6 PRBCs from 6/28 - 6/29.  Hgb currently down but stable.    *  Coagulopathy.  INR rising despite  coumadin held, last dose 7/12  *  S/P LVAD 6/28.     PLAN   *  Enteroscopy once INR at or blelow 2.  Cancelled tomorrow's procedure given rising INR   *  Daily CBC.  Dr Gala Romney likely will give 1 PRBC today.      James [name]  05/04/2017, 11:21 AM Pager: [phone]

## 2017-05-04 NOTE — Progress Notes (Signed)
17 Days Post-Op Procedure(s) (LRB): MEDIASTINAL EXPLORATION (N/A) Subjective: Evidence of upper GI bleeding requiring multiple  transfusion in the past 72 hours VAD performance parameters and hemodynamics stable on low-dose milrinone    C0-0x greater than 72% Renal function improving with creatinine now 1.9, Lasix used  sparingly after   transfusions with low CVP Mean arterial pressure well regulated on Apresoline and imdur Chest x-ray today is clear Maintaining sinus rhythm on oral amiodarone, minimal ectopy Sternal incision is well-healed Patient walked 300 feet today in hallway Encourage oral intake with  ensure supplements Objective: Vital signs in last 24 hours: Temp:  [97.3 F (36.3 C)-99.7 F (37.6 C)] 98.6 F (37 C) (07/15 1700) Pulse Rate:  [62-83] 72 (07/15 1715) Cardiac Rhythm: Normal sinus rhythm;Heart block (07/15 1600) Resp:  [19-32] 26 (07/15 1715) BP: (85-110)/(68-88) 94/72 (07/15 1715) SpO2:  [97 %-100 %] 100 % (07/15 1715) Weight:  [225 lb 8.5 oz (102.3 kg)] 225 lb 8.5 oz (102.3 kg) (07/15 0500)  Hemodynamic parameters for last 24 hours: CVP:  [2 mmHg-22 mmHg] 6 mmHg  Intake/Output from previous day: 07/14 0701 - 07/15 0700 In: 2790.2 [P.O.:600; I.V.:1095.2; Blood:1095] Out: 1351 [Urine:1350; Stool:1] Intake/Output this shift: Total I/O In: 743 [P.O.:240; I.V.:79.2; Blood:423.8] Out: 600 [Urine:600]       Exam    General- alert and comfortable   Lungs- clear without rales, wheezes   Cor- regular rate and rhythm, no murmur , gallop   Abdomen- soft, non-tender   Extremities - warm, non-tender, minimal 2+ edema   Neuro- oriented, appropriate, no focal weakness   Lab Results:  Recent Labs  05/03/17 2151 05/04/17 0355  WBC 13.2* 12.2*  HGB 7.8* 7.9*  HCT 22.9* 23.1*  PLT 185 158   BMET:  Recent Labs  05/03/17 0300 05/04/17 0355  NA 133* 134*  K 4.2 3.9  CL 104 107  CO2 22 21*  GLUCOSE 201* 218*  BUN 83* 78*  CREATININE 2.02* 1.92*   CALCIUM 7.6* 7.4*    PT/INR:  Recent Labs  05/04/17 0355  LABPROT 39.6*  INR 3.94   ABG    Component Value Date/Time   PHART 7.406 04/28/2017 0407   HCO3 20.2 04/28/2017 0407   TCO2 22 05/02/2017 1628   ACIDBASEDEF 4.0 (H) 04/28/2017 0407   O2SAT 72.5 05/04/2017 0405   CBG (last 3)   Recent Labs  05/04/17 0808 05/04/17 1231 05/04/17 1604  GLUCAP 166* 168* 160*    Assessment/Plan: S/P Procedure(s) (LRB): MEDIASTINAL EXPLORATION (N/A) Recheck hemoglobin in a.m. Upper endoscopy after INR approaches 1.5 INR still rising despite holding Coumadin  LOS: 23 days    Kathlee Nationseter Van Trigt III 05/04/2017

## 2017-05-05 ENCOUNTER — Encounter (HOSPITAL_COMMUNITY): Admission: AD | Disposition: E | Payer: Self-pay | Source: Ambulatory Visit | Attending: Surgery

## 2017-05-05 DIAGNOSIS — I5023 Acute on chronic systolic (congestive) heart failure: Secondary | ICD-10-CM

## 2017-05-05 DIAGNOSIS — Z95811 Presence of heart assist device: Secondary | ICD-10-CM

## 2017-05-05 LAB — CBC
HCT: 19.1 % — ABNORMAL LOW (ref 39.0–52.0)
HCT: 21.9 % — ABNORMAL LOW (ref 39.0–52.0)
HEMATOCRIT: 24.9 % — AB (ref 39.0–52.0)
HEMOGLOBIN: 6.5 g/dL — AB (ref 13.0–17.0)
Hemoglobin: 7.4 g/dL — ABNORMAL LOW (ref 13.0–17.0)
Hemoglobin: 8.5 g/dL — ABNORMAL LOW (ref 13.0–17.0)
MCH: 30.2 pg (ref 26.0–34.0)
MCH: 29.6 pg (ref 26.0–34.0)
MCH: 29.5 pg (ref 26.0–34.0)
MCHC: 34 g/dL (ref 30.0–36.0)
MCHC: 33.8 g/dL (ref 30.0–36.0)
MCHC: 34.1 g/dL (ref 30.0–36.0)
MCV: 88.8 fL (ref 78.0–100.0)
MCV: 87.6 fL (ref 78.0–100.0)
MCV: 86.5 fL (ref 78.0–100.0)
PLATELETS: 162 10*3/uL (ref 150–400)
PLATELETS: 166 10*3/uL (ref 150–400)
Platelets: 160 10*3/uL (ref 150–400)
RBC: 2.15 MIL/uL — AB (ref 4.22–5.81)
RBC: 2.5 MIL/uL — AB (ref 4.22–5.81)
RBC: 2.88 MIL/uL — ABNORMAL LOW (ref 4.22–5.81)
RDW: 18.4 % — ABNORMAL HIGH (ref 11.5–15.5)
RDW: 18.7 % — AB (ref 11.5–15.5)
RDW: 18.8 % — AB (ref 11.5–15.5)
WBC: 12.3 10*3/uL — ABNORMAL HIGH (ref 4.0–10.5)
WBC: 13.9 10*3/uL — AB (ref 4.0–10.5)
WBC: 17.4 10*3/uL — AB (ref 4.0–10.5)

## 2017-05-05 LAB — BASIC METABOLIC PANEL
ANION GAP: 8 (ref 5–15)
Anion gap: 6 (ref 5–15)
BUN: 80 mg/dL — ABNORMAL HIGH (ref 6–20)
BUN: 78 mg/dL — AB (ref 6–20)
CALCIUM: 7.5 mg/dL — AB (ref 8.9–10.3)
CHLORIDE: 107 mmol/L (ref 101–111)
CO2: 21 mmol/L — ABNORMAL LOW (ref 22–32)
CO2: 20 mmol/L — AB (ref 22–32)
Calcium: 7.3 mg/dL — ABNORMAL LOW (ref 8.9–10.3)
Chloride: 108 mmol/L (ref 101–111)
Creatinine, Ser: 1.88 mg/dL — ABNORMAL HIGH (ref 0.61–1.24)
Creatinine, Ser: 1.87 mg/dL — ABNORMAL HIGH (ref 0.61–1.24)
GFR calc Af Amer: 45 mL/min — ABNORMAL LOW (ref >60)
GFR calc non Af Amer: 39 mL/min — ABNORMAL LOW (ref >60)
GFR calc non Af Amer: 39 mL/min — ABNORMAL LOW (ref >60)
GFR, EST AFRICAN AMERICAN: 45 mL/min — AB (ref >60)
GLUCOSE: 217 mg/dL — AB (ref 65–99)
Glucose, Bld: 176 mg/dL — ABNORMAL HIGH (ref 65–99)
POTASSIUM: 3.8 mmol/L (ref 3.5–5.1)
Potassium: 4 mmol/L (ref 3.5–5.1)
SODIUM: 134 mmol/L — AB (ref 135–145)
Sodium: 136 mmol/L (ref 135–145)

## 2017-05-05 LAB — DIFFERENTIAL
BASOS ABS: 0 10*3/uL (ref 0.0–0.1)
Basophils Relative: 0 %
EOS ABS: 0.1 10*3/uL (ref 0.0–0.7)
Eosinophils Relative: 1 %
LYMPHS ABS: 1.1 10*3/uL (ref 0.7–4.0)
Lymphocytes Relative: 9 %
MONOS PCT: 4 %
Monocytes Absolute: 0.5 10*3/uL (ref 0.1–1.0)
Neutro Abs: 10.5 10*3/uL — ABNORMAL HIGH (ref 1.7–7.7)
Neutrophils Relative %: 85 %

## 2017-05-05 LAB — GLUCOSE, CAPILLARY
GLUCOSE-CAPILLARY: 184 mg/dL — AB (ref 65–99)
GLUCOSE-CAPILLARY: 177 mg/dL — AB (ref 65–99)
GLUCOSE-CAPILLARY: 204 mg/dL — AB (ref 65–99)
Glucose-Capillary: 214 mg/dL — ABNORMAL HIGH (ref 65–99)

## 2017-05-05 LAB — PROTIME-INR
INR: 3.22
Prothrombin Time: 33.6 seconds — ABNORMAL HIGH (ref 11.4–15.2)

## 2017-05-05 LAB — COOXEMETRY PANEL
CARBOXYHEMOGLOBIN: 2.1 % — AB (ref 0.5–1.5)
METHEMOGLOBIN: 1.2 % (ref 0.0–1.5)
O2 Saturation: 73.6 %
Total hemoglobin: 6.6 g/dL — CL (ref 12.0–16.0)

## 2017-05-05 LAB — LACTATE DEHYDROGENASE: LDH: 164 U/L (ref 98–192)

## 2017-05-05 LAB — PREPARE RBC (CROSSMATCH)

## 2017-05-05 SURGERY — ENTEROSCOPY
Anesthesia: Monitor Anesthesia Care

## 2017-05-05 MED ORDER — AMIODARONE HCL IN DEXTROSE 360-4.14 MG/200ML-% IV SOLN
INTRAVENOUS | Status: AC
  Filled 2017-05-05: qty 200

## 2017-05-05 MED ORDER — AMIODARONE LOAD VIA INFUSION
150.0000 mg | Freq: Once | INTRAVENOUS | Status: AC
  Administered 2017-05-05: 150 mg via INTRAVENOUS
  Filled 2017-05-05: qty 83.34

## 2017-05-05 MED ORDER — AMIODARONE HCL IN DEXTROSE 360-4.14 MG/200ML-% IV SOLN: 30.0000 mg/h | INTRAVENOUS | Status: DC

## 2017-05-05 MED ORDER — AMIODARONE HCL IN DEXTROSE 360-4.14 MG/200ML-% IV SOLN
60.0000 mg/h | INTRAVENOUS | Status: DC
  Administered 2017-05-05: 60 mg/h via INTRAVENOUS

## 2017-05-05 MED ORDER — FUROSEMIDE 10 MG/ML IJ SOLN
20.0000 mg | Freq: Once | INTRAMUSCULAR | Status: AC
  Administered 2017-05-05: 20 mg via INTRAVENOUS
  Filled 2017-05-05: qty 2

## 2017-05-05 MED ORDER — AMIODARONE HCL IN DEXTROSE 360-4.14 MG/200ML-% IV SOLN
30.0000 mg/h | INTRAVENOUS | Status: DC
  Administered 2017-05-05 – 2017-05-07 (×6): 60 mg/h via INTRAVENOUS
  Administered 2017-05-07: 30 mg/h via INTRAVENOUS
  Administered 2017-05-07 (×2): 60 mg/h via INTRAVENOUS
  Administered 2017-05-09 (×3): 30 mg/h via INTRAVENOUS
  Filled 2017-05-05 (×12): qty 200

## 2017-05-05 MED ORDER — ENSURE ENLIVE PO LIQD
237.0000 mL | ORAL | Status: DC
  Administered 2017-05-09 – 2017-05-18 (×10): 237 mL via ORAL

## 2017-05-05 MED ORDER — SODIUM CHLORIDE 0.9 % IV SOLN
Freq: Once | INTRAVENOUS | Status: AC
  Administered 2017-05-05: 06:00:00 via INTRAVENOUS

## 2017-05-05 MED ORDER — MAGNESIUM SULFATE 2 GM/50ML IV SOLN
2.0000 g | Freq: Once | INTRAVENOUS | Status: AC
  Administered 2017-05-05: 2 g via INTRAVENOUS
  Filled 2017-05-05: qty 50

## 2017-05-05 MED ORDER — SODIUM CHLORIDE 0.9 % IV SOLN
Freq: Once | INTRAVENOUS | Status: AC
  Administered 2017-05-05: 18:00:00 via INTRAVENOUS

## 2017-05-05 MED ORDER — BOOST / RESOURCE BREEZE PO LIQD
1.0000 | Freq: Two times a day (BID) | ORAL | Status: DC
  Administered 2017-05-05 – 2017-05-18 (×16): 1 via ORAL

## 2017-05-05 NOTE — Progress Notes (Signed)
Nutrition Follow-up  DOCUMENTATION CODES:   Severe malnutrition in context of chronic illness  INTERVENTION:  - Will decrease Ensure Enlive to once/day, this supplement provides 350 kcal and 20 grams of protein. - Will order Boost Breeze BID, each supplement provides 250 kcal and 9 grams of protein - Continue to encourage PO intakes of meals and supplements. - RD will continue to monitor for needs.  NUTRITION DIAGNOSIS:   Malnutrition (severe) related to chronic illness (CHF) as evidenced by severe depletion of body fat, severe depletion of muscle mass. -ongoing  GOAL:   Patient will meet greater than or equal to 90% of their needs -unmet  MONITOR:   PO intake, Supplement acceptance, Labs, Weight trends, Skin, I & O's  ASSESSMENT:   56 yo male with hx of HTN, gout, obesity, chronic edema, CAD, ischemic cardiomyopathy, DM2, AICD, MI, CHF, renal insufficiency who was admitted on 6/22 with acute on chronic systolic heart failure.  6/28  HVAD placement 6/29  Extubated 7/01  Soft diet started, Ensure Enlive added 7/04  Re-intubated 7/05  TPN initiated 7/06  Extubated 7/07  NGT removed 7/09  Advanced to FLD   7/16 Diet advanced from FLD to Soft on 7/10 at 8:30 AM. TPN was d/c'ed on 7/11. He has been consuming 25-50% of meals from 7/12-today. Ensure Enlive ordered BID on 7/13 and pt refused all doses on 7/13 and 7/14 but accepted yesterday. Will change oral nutrition supplements as outlined above. Weight + 3 lbs from last assessment.   Per Cardiothoracic MD note yesterday PM, pt with UGIB that required multiple transfusions over 72 hours. Enteroscopy pending once INR at or below 2.  Medications reviewed; 20 mg IV Lasix x1 dose yesterday and x1 dose today, sliding scale Novolog, 40 mg IV Protonix BID. Labs reviewed; CBGs: 184 and 177 mg/dL today, Na: 161134 mmol/L, BUN: 80 mg/dL, creatinine: 0.961.88 mg/dL, Ca: 7.3 mg/dL, GFR: 39 mL/min.    7/9 - Pt found to have ileus after  vomiting fecal material.  - Advanced to Full Liquids today. - Patient is receiving TPN with Clinimix E 5/15 @ 105 ml/hr. Lipids on hold.  - Provides 1789 kcal and 126 grams protein per day.  - Meets 85% minimum estimated energy needs and 100% minimum estimated protein needs.      Diet Order:  DIET SOFT Room service appropriate? Yes; Fluid consistency: Thin  Skin:  Wound (see comment) (open wound R leg; boil R buttocks)  Last BM:  7/16  Height:   Ht Readings from Last 1 Encounters:  05/20/2017 6' (1.829 m)    Weight:   Wt Readings from Last 1 Encounters:  04/23/2017 (S) 223 lb 15.8 oz (101.6 kg)    Ideal Body Weight:  80.9 kg  BMI:  Body mass index is 30.38 kg/m.  Estimated Nutritional Needs:   Kcal:  2100-2300  Protein:  120-140 gm  Fluid:  2.1-2.3 L  EDUCATION NEEDS:   No education needs identified at this time    Trenton GammonJessica Elyssia Strausser, MS, RD, LDN, CNSC Inpatient Clinical Dietitian Pager # [phone] After hours/weekend pager # [phone]

## 2017-05-05 NOTE — Progress Notes (Addendum)
Dr. Shirlee LatchMclean notified of rhythm change from sinus in the 70s/80s to a wide complex tachycardia in the 140s. Patient stable otherwise with no complaints of pain or discomfort. All VAD numbers unchanged. Orders for 2 g Magnesium, Amiodarone bolus and gtt per order set, and BMP sent. Will implement orders and continue to monitor.  Dr. Shirlee LatchMclean updated about drop in pressure and heart rate unchanged with previous orders. Orders received to stop Milrinone, make patient NPO in anticipation of cardioversion tomorrow, and rebolus with 150 mg of Amiodarone and leave gtt at 60 mg/hr and do not transition to 30 mg/hr. Will continue to monitor.

## 2017-05-05 NOTE — Progress Notes (Signed)
PT Cancellation Note  Patient Details Name: James [name] MRN: [id number] DOB: 08-Nov-1960   Cancelled Treatment:    Reason Eval/Treat Not Completed: Medical issues which prohibited therapy Hold today per RN. PT will continue to follow acutely.    Derek MoundKellyn R Elvin Banker Alima Naser, PTA Pager: [phone]   05/20/2017, 2:56 PM

## 2017-05-05 NOTE — Progress Notes (Signed)
ANTICOAGULATION CONSULT NOTE - Follow Up Consult  Pharmacy Consult for Warfarin > holding, Heparin when INR < 1.8 Indication: HVAD  No Active Allergies  Patient Measurements: Height: 6' (182.9 cm) Weight: (S) 223 lb 15.8 oz (101.6 kg) IBW/kg (Calculated) : 77.6  Vital Signs: Temp: 98.4 F (36.9 C) (07/16 0800) Temp Source: Oral (07/16 0800) BP: 100/71 (07/16 0800) Pulse Rate: 79 (07/16 0800)  Labs:  Recent Labs  05/03/17 0300  05/03/17 2151 05/04/17 0355 November 12, 2016 0332  HGB 7.1*  < > 7.8* 7.9* 6.5*  HCT 21.1*  < > 22.9* 23.1* 19.1*  PLT 186  < > 185 158 160  LABPROT 36.6*  --   --  39.6* 33.6*  INR 3.58  --   --  3.94 3.22  CREATININE 2.02*  --   --  1.92* 1.88*  < > = values in this interval not displayed.  Estimated Creatinine Clearance: 55.4 mL/min (A) (by C-G formula based on SCr of 1.88 mg/dL (H)).   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 He developed an ileus on 7/5, warfarin was held, and heparin bridge started once INR fell below 1.8.  Heparin stopped 7/10 with INR 2.19. On 7/13, Hgb dropped and he had melena - warfarin held again and pharmacy asked to start heparin once INR < 1.8 pending GI workup. INR finally trending down to 3.22 today. Plan for EGD once INR < 1.5. Hgb down 7.9 > 6.5 - 2 units today.  Goal of Therapy:  Heparin level 0.3-0.5 INR goal 2-3 Monitor platelets by anticoagulation protocol: Yes   Plan:  1) Continue to hold warfarin and aspirin 2) Daily INR - start heparin when < 1.8, aim for lower goal  Louie CasaJennifer Andrew Blasius, PharmD, BCPS 02/05/2017 8:53 AM

## 2017-05-05 NOTE — Progress Notes (Signed)
Patient ID: James [name], male   DOB: 09/01/1961, 56 y.o.   MRN: [id number] \  Advanced Heart Failure VAD Team Note  Subjective:    HVAD placed 6/28.  Returned to the OR that evening with high chest tube output, evacuation of mediastinal hematoma.   Extubated 6/29. Milrinone stopped on 7/4.  Evening of 7/4, patient developed ileus with respiratory compromise. NGT placed with 3 L suctioned out.  CXR with suspicion for aspiration PNA.  Patient had to be intubated.  He went into atrial fibrillation with RVR.  He became hypotensive and was started on norepinephrine and phenylephrine.  Amiodarone gtt begun.     Extubated again on 7/6.   On 7/13 Had 2 sustained episodes of VT. Had to be cardioverted 1. Second episode broke with overdrive pacing with Dr. Graciela HusbandsKlein. Vad speed turned down to 2700.  Continues to have melena/maroon stool.  2 units PRBCs 7/14, 2 units 7/15.    Hemoglobin 9.1->7.8-> 7.9 -> 6.5  Diuretics on hold due to low CVP, 5 this morning. Weight down 2 lbs. Co-ox 74% on milrinone 0.125. Creatinine continues to improve. Now down to 1.88.   INR 3.9 => 3.22.    Feels weak but otherwise doing okay. Denies orthopnea or PND. Walked in hall yesterday, gradually getting better with cardiac rehab. Mild dyspnea on exertion. No further VT, now on po amiodarone.   HVAD INTERROGATION:  HVAD:  Flow 4.7 liters/min, speed 2700, power 4.1 W,  Peak 7 Trough 3 Suction off. Lavare On  Objective:    Vital Signs:   Temp:  [97.3 F (36.3 C)-98.9 F (37.2 C)] 98.7 F (37.1 C) (07/16 0610) Pulse Rate:  [70-157] 83 (07/16 0700) Resp:  [19-31] 24 (07/16 0700) BP: (81-108)/(63-88) 85/63 (07/16 0700) SpO2:  [98 %-100 %] 100 % (07/16 0700) Weight:  [223 lb 15.8 oz (101.6 kg)] 223 lb 15.8 oz (101.6 kg) (07/16 0500) Last BM Date: 05/04/17 Mean arterial Pressure 70s  Intake/Output:   Intake/Output Summary (Last 24 hours) at 05/13/2017 0734 Last data filed at 04/21/2017 0600  Gross per 24 hour    Intake           870.32 ml  Output             1450 ml  Net          -579.68 ml     Physical Exam   CVP  5 General:  Chronically ill, NAD  HEENT: normal  Neck: supple. JVP 7-8 cm.  No lymphadenopathy or thryomegaly appreciated. Cor: LVAD hum.  Lungs: Clear anteriorly.  Abdomen: soft, nontender, non-distended. No hepatosplenomegaly. No bruits or masses. Good bowel sounds. Driveline site clean. Anchor in place.  Extremities: no cyanosis, clubbing, rash. + UNNA boots. 1+ edema above boots. Severe gouty changes Neuro: alert & oriented x 3. No focal deficits. Moves all 4 without problem  Affect flat     Telemetry   Sinus 70s. No recurrent VT  Personally reviewed  Labs   Basic Metabolic Panel:  Recent Labs Lab 04/30/17 0315 05/01/17 0323 05/02/17 0346 05/02/17 1628 05/03/17 0300 05/04/17 0355 04/30/2017 0332  NA 129* 131* 133* 135 133* 134* 134*  K 3.8 3.9 3.8 4.2 4.2 3.9 3.8  CL 99* 101 105 101 104 107 107  CO2 21* 21* 21*  --  22 21* 21*  GLUCOSE 144* 151* 134* 230* 201* 218* 176*  BUN 88* 91* 89* 84* 83* 78* 80*  CREATININE 3.00* 2.59* 2.26* 1.80* 2.02* 1.92* 1.88*  CALCIUM 7.8* 7.7* 7.7*  --  7.6* 7.4* 7.3*  MG 1.9  --   --   --  1.9  --   --     Liver Function Tests:  Recent Labs Lab 05/04/17 0355  AST 21  ALT 11*  ALKPHOS 56  BILITOT 0.7  PROT 4.2*  ALBUMIN 1.7*   No results for input(s): LIPASE, AMYLASE in the last 168 hours. No results for input(s): AMMONIA in the last 168 hours.  CBC:  Recent Labs Lab 05/02/17 1535  05/03/17 0300 05/03/17 1500 05/03/17 2151 05/04/17 0355 05/16/2017 0332  WBC 8.9  --  7.7 11.8* 13.2* 12.2* 12.3*  NEUTROABS 7.2  --   --   --   --   --  10.5*  HGB 7.3*  < > 7.1* 9.1* 7.8* 7.9* 6.5*  HCT 21.7*  < > 21.1* 26.3* 22.9* 23.1* 19.1*  MCV 87.1  --  86.1 86.2 87.7 86.8 88.8  PLT 213  --  186 190 185 158 160  < > = values in this interval not displayed.  INR:  Recent Labs Lab 05/01/17 0323 05/02/17 0346  05/03/17 0300 05/04/17 0355 04/29/2017 0332  INR 2.28 2.95 3.58 3.94 3.22    Other results:     Imaging   Dg Chest Port 1 View  Result Date: 05/04/2017 CLINICAL DATA:  Followup bibasilar atelectasis. EXAM: PORTABLE CHEST 1 VIEW COMPARISON:  05/02/2017. FINDINGS: Poor inspiration. Stable enlarged cardiac silhouette and post CABG changes. The left ventricular assist device is unchanged as is a left subclavian AICD lead and right PICC. Clear lungs. Right shoulder fixation anchors. IMPRESSION: No acute abnormality. Electronically Signed   By: Beckie Salts M.D.   On: 05/04/2017 07:39     Medications:     Scheduled Medications: . amiodarone  400 mg Oral BID  . Chlorhexidine Gluconate Cloth  6 each Topical Daily  . febuxostat  40 mg Oral Daily  . feeding supplement (ENSURE ENLIVE)  237 mL Oral BID BM  . fentaNYL (SUBLIMAZE) injection  50 mcg Intravenous Once  . furosemide  20 mg Intravenous Once  . hydrALAZINE  25 mg Oral Q8H  . insulin aspart  0-15 Units Subcutaneous TID WC  . isosorbide mononitrate  30 mg Oral Daily  . pantoprazole (PROTONIX) IV  40 mg Intravenous Q12H  . sodium chloride flush  10-40 mL Intracatheter Q12H  . sodium chloride flush  3 mL Intravenous Q12H    Infusions: . sodium chloride Stopped (04/19/17 1200)  . sodium chloride Stopped (04/21/17 1500)  . sodium chloride Stopped (05/02/17 0630)  . sodium chloride 5 mL/hr at 05/09/2017 0600  . lactated ringers Stopped (05/01/17 0810)  . milrinone 0.125 mcg/kg/min (05/11/2017 0600)  . phenylephrine (NEO-SYNEPHRINE) Adult infusion Stopped (05/02/17 2000)    PRN Medications: albuterol, docusate, hydrALAZINE, neomycin-bacitracin-polymyxin, ondansetron (ZOFRAN) IV, oxyCODONE, phenol, sodium chloride flush, sodium chloride flush, traMADol   Patient Profile   56 yo with CAD s/p CABG, ischemic cardiomyopathy/chronic systolic CHF, tophaceous gout, and CKD stage 3 was admitted for diuresis and consideration for LVAD  placement. S/p HVAD on 6/28  Assessment/Plan:    1. Acute/chronic systolic CHF s/p HVAD: Ischemic cardiomyopathy.  [employer] ICD.  Echo (6/18) with EF 15%, mildly dilated RV with moderately decreased systolic function.  s/p HVAD placement 6/28 and had to return to OR to evacuate mediastinal hematoma.  He had been weaned off pressors/milrinone, but developed ileus w/ likely aspiration event 7/4, re-intubated, developed afib/RVR requiring norepinephrine. Extubated 7/6.  Continues to improve from HF standpoint. Has LE edema but CVP remains low.  Diuretics on hold due to recent suction events/VT and low CVP. VAD speed turned down to 2700 with VT. VAD parameters currently look good.  Co-ox 74% on milrinone 0.125. MAP 70s, stable.  - Will give one dose IV lasix with blood today - Continue milrinone for now, will stop when more stable (GI bleeding contained).  - Continue hydralazine 25 tid and and imdur to 30 mg daily. Would not push hard with ongoing bleeding - Off ASA and coumadin with GI bleed. INR 3.22.  - No dig/spironolactone/arb with elevated creatinine.    2. AKI on CKD stage 3: Suspect a component of peri-op ATN, worsened with ileus, vomiting, intubation/sedation.  Creatinine trending down 1.88 today  Keep Map >70 and <90 -Will give one dose lasix today with RBCs 3. Symptomatic anemia due to acute UGI bleeding:  Having ongoing melena/maroon stool. Hgb 6.5 today. ASA/warfarin on hold.  - Will give 2 u pRBCs, repeat CBC in afternoon.  - INR remains elevated 3.6-> 3.9 -> 3.22 off coumadin. Encouraged po intake. - Will delay EGD until midweek when INR down, need to see < 2.   - Continue IV Protonix 4. Ventricular tachycardia: Status post ventricular tachycardia 2 on 7/13. Possible suction event. VAD speed turned down to 2700. - Continue po amiodarone.  5. CAD: s/p CABG 2012: No s/s of ischemia. Off aspirin due to GI bleeding. 6. Gout: Severe tophaceous gout. No complaint of gouty pain today.  -  Continue uloric. Holding colchicine with AKI.     7. Atrial fibrillation: Developed atrial fibrillation with RVR in setting of aspiration PNA on 7/4.  He is in NSR.  - On po amiodarone.  8. Malnutrition: Now off TPN. Appetite is improving. Encouraged po intake 9. Aspiration PNA with acute hypoxemic respiratory failure on 7/4: He was extubated on 7/6. Tracheal aspirate with Klebsiella and citrobacter. Both sensitive to Zosyn.  - Completed abx 7/13  10. Ileus: Resolved 11. Deconditioning:  PT following. CIR following. Continue ambulation in halls. Continue IS.   I reviewed the HVAD parameters from today, and compared the results to the patient's prior recorded data.  No programming changes were made.  The HVAD is functioning within specified parameters.     Marca Ancona, MD 05/13/2017, 7:34 AM  VAD Team --- VAD ISSUES ONLY--- Pager [phone] (7am - 7am) Advanced Heart Failure Team  Pager [phone] (M-F; 7a - 4p)  Please contact [facility] for night-coverage after hours (4p -7a ) and weekends on [email or web address]

## 2017-05-05 NOTE — Progress Notes (Signed)
CRITICAL VALUE ALERT  Critical Value:  Hemoglobin 6.5  Date & Time Notied:  05/11/2017 04:51 AM  Provider Notified: Dr. Cristal Deerhristopher, MD night cardiologist; Carlton AdamSarah Herbert, RN VAD Coordinator  Orders Received/Actions taken: Orders received to transfuse 2 units PRBCs. Will implement and continue to monitor.  Herma ArdMOSELEY, Trena Dunavan F, RN

## 2017-05-05 NOTE — Progress Notes (Signed)
Daily Rounding Note  05/14/2017, 12:19 PM  LOS: 24 days   SUBJECTIVE:   Chief complaint:  Maroon stools/melena continues.  He is eating pretty well and taking Ensure Plus whole milk as supplements. He is making an effort to eat more than he feels like eating in order to gain strength. No abdominal pain or nausea. Feels a bit tired.     OBJECTIVE:         Vital signs in last 24 hours:    Temp:  [97.3 F (36.3 C)-98.7 F (37.1 C)] 97.7 F (36.5 C) (07/16 1130) Pulse Rate:  [63-157] 150 (07/16 1200) Resp:  [19-31] 24 (07/16 1200) BP: (81-108)/(63-82) 85/67 (07/16 1200) SpO2:  [98 %-100 %] 100 % (07/16 1200) Weight:  [101.6 kg (223 lb 15.8 oz)] 101.6 kg (223 lb 15.8 oz) (07/16 0500) Last BM Date: 04/30/2017 Filed Weights   05/03/17 0600 05/04/17 0500 05/02/2017 0500  Weight: 101.7 kg (224 lb 3.2 oz) 102.3 kg (225 lb 8.5 oz) (S) 101.6 kg (223 lb 15.8 oz)   General: Looks the same, somewhat chronically ill looking alert, comfortable.   Heart: Regular rhythm beneath the bad home. Chest: Clear bilaterally. A little bit of phlegm in his throat as he speaks. Abdomen: Soft. Not tender or distended. Bowel sounds active.  Extremities: Some edema in the proximal to the Unna boots wrapped around his legs. Severe gouty changes in the digits of the fingers. Neuro/Psych:  Pleasant. Affect bland but not obviously depressed. Cooperative, engaged.  Intake/Output from previous day: 07/15 0701 - 07/16 0700 In: 870.3 [P.O.:240; I.V.:206.6; Blood:423.8] Out: 1450 [Urine:1450]  Intake/Output this shift: Total I/O In: 657.6 [I.V.:17.6; Blood:640] Out: 1225 [Urine:1225]  Lab Results:  Recent Labs  05/03/17 2151 05/04/17 0355 05/11/2017 0332  WBC 13.2* 12.2* 12.3*  HGB 7.8* 7.9* 6.5*  HCT 22.9* 23.1* 19.1*  PLT 185 158 160   BMET  Recent Labs  05/03/17 0300 05/04/17 0355 04/25/2017 0332  NA 133* 134* 134*  K 4.2 3.9 3.8  CL 104  107 107  CO2 22 21* 21*  GLUCOSE 201* 218* 176*  BUN 83* 78* 80*  CREATININE 2.02* 1.92* 1.88*  CALCIUM 7.6* 7.4* 7.3*   LFT  Recent Labs  05/04/17 0355  PROT 4.2*  ALBUMIN 1.7*  AST 21  ALT 11*  ALKPHOS 56  BILITOT 0.7  BILIDIR 0.2  IBILI 0.5   PT/INR  Recent Labs  05/04/17 0355 05/19/2017 0332  LABPROT 39.6* 33.6*  INR 3.94 3.22   Hepatitis Panel No results for input(s): HEPBSAG, HCVAB, HEPAIGM, HEPBIGM in the last 72 hours.  Studies/Results: Dg Chest Port 1 View  Result Date: 05/04/2017 CLINICAL DATA:  Followup bibasilar atelectasis. EXAM: PORTABLE CHEST 1 VIEW COMPARISON:  05/02/2017. FINDINGS: Poor inspiration. Stable enlarged cardiac silhouette and post CABG changes. The left ventricular assist device is unchanged as is a left subclavian AICD lead and right PICC. Clear lungs. Right shoulder fixation anchors. IMPRESSION: No acute abnormality. Electronically Signed   By: Beckie Salts M.D.   On: 05/04/2017 07:39    ASSESMENT:   *  GI bleeding  On empiric BID, IV Protonix.    *  Blood loss anemia.  s/p 16 PRBCs, 2 of these yesterday but hgb dropped this AM so recieved 2 more today.      *  Coagulopathy.  INR not yet suitable for endoscopic evaluation. Last Coumadin given 7/12.  Plan to initiate a heparin drip when INR  less than 1.8  *  S/P LVAD 6/28.     PLAN   *  Enteroscopy once INR at or below 2.  Patient aware of the issues and why we are delaying enteroscopy.  *  I wonder if we could allow patient to have increased amount of leafy green vegetables such as mustard or turnip greens, spinach in and attempts to reverse the INR?    Jennye MoccasinSarah Krina Mraz  05/16/2017, 12:19 PM Pager: [phone]

## 2017-05-05 NOTE — Progress Notes (Signed)
Inpatient Rehabilitation  Continuing to follow for timing of medical readiness.  Note medical team is awaiting optional INR level prior to proceeding with EGD.  I have updated [employer] and will re-submit for insurance authorization about 48 hours prior to anticipated medical readiness.  Please call with questions.   Charlane FerrettiMelissa Blakely Gluth, M.A., CCC/SLP Admission Coordinator  [facility]  Cell [phone]

## 2017-05-05 NOTE — Progress Notes (Addendum)
LVAD Inpatient Coordinator Rounding Note:  Admitted 04/16/2017 due to A/C Heart failure.   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 Laneta SimmersBartle as DT VAD.  Vital signs: HR: 78 Doppler Pressure: 86 BP: 96/74 (81) O2 Sat: 99on Room Air Wt:207>209 > 213 >213 > 208>203>220>227lbs>226>225>224>224>225>223   LVAD interrogation reveals:  Speed: 2700 Flow: 5.0 Power: 4 Alarms:none Peak: 7.5 Trough: 3.3 HCT: 19-pump set to 20 (lowest feature possible) Low flow alarm setting: 3.5 High watt alarm setting: 7.0  Suction: Turned off  Lavare cycle: on  Blood Products: 6/28> 5 PRBC's, 6 FFP 7/1> 1 PRBC 7/9> 1 PRBC 7/13> 3 PRBC 7/14>3 PRBC 7/15> 1 PRBC 7/16> 2 PRBC  Gtts: Milrinone restarted 7/8 currently @ 0.125 mcg/kg/min Levo stopped 04/26/17 Amiodarone stopped 04/29/17 Heparin stopped 04/29/17  TPN - started 04/24/17 for nutritional support, stopped 04/30/17  Arrhythmia: 04/24/17 - Afib with RVR - started amiodarone, now PO  Respiratory: 04/23/17 - re-intubated due to respiratory failure secondary to suspected aspiration pneumonia 04/25/17-extubated  Drive Line: Daily Dressing Kits with Aquacell AG silver strips per protocol.    Labs:  LDH trend:160 (pre VAD)>227>215>191>212>207>222>217>219>163>207>217>200>183>161>164  INR trend: 1.31>1.45>1.44>1.25>1.33>1.68>2.61>2.4>1.81>2.19>2.09>2.28>3.58>3.94>3.22  Anticoagulation Plan: -INR Goal: 2-2.5  -ASA Dose: 325 mg- on hold  Adverse Events on VAD: -Return to OR 6/28 with high chest tube output, evacuation of mediastinal hematoma -Ileus with vomiting on 04/23/17; re-intubation for acute respiratory failure; probable aspiration pneumonia -7/13-GI bleed  Plan/Recommendations:  1. Transfuse blood per order. 2. Please continue daily dressing changes and include patients wife when visiting. Per patient, she will not be in today. 3. EGD when INR less than 2  Marcellus ScottLesley Byren Pankow RN, VAD Coordinator 24/7 pager  [phone]

## 2017-05-05 NOTE — Progress Notes (Signed)
Received page from bedside nurse stating that pts hgb is 6.5. Nurse instructed to transfuse 2 units PRBCs per Dr. Shirlee LatchMclean.

## 2017-05-05 NOTE — Progress Notes (Signed)
Patient ID: James [name], male   DOB: 1961/10/02, 56 y.o.   MRN: [id number] HVAD Rounding Note  Subjective:    Feels ok but continues to have blood in stools according to nurses. Received 2 units of blood on 7/14 and 2 units on 7/15. Hgb down to 6.5 this am from 7.9 yesterday despite the transfusion.  Eating fairly well, ambulating, no abdominal complaints.  CVP 5 this am and still on milrinone 0.125 with Co-ox of 74%.  LVAD INTERROGATION:  HVAD:  Flow 4.7 liters/min, speed 2700, power 4.1  Objective:    Vital Signs:   Temp:  [97.3 F (36.3 C)-98.7 F (37.1 C)] 97.7 F (36.5 C) (07/16 1130) Pulse Rate:  [70-157] 81 (07/16 1130) Resp:  [19-31] 24 (07/16 1130) BP: (81-108)/(63-82) 89/65 (07/16 1130) SpO2:  [98 %-100 %] 98 % (07/16 1130) Weight:  [101.6 kg (223 lb 15.8 oz)] 101.6 kg (223 lb 15.8 oz) (07/16 0500) Last BM Date: 04/01/2017 Mean arterial Pressure 70's  Intake/Output:   Intake/Output Summary (Last 24 hours) at 04/01/2017 1207 Last data filed at 04/01/2017 1130  Gross per 24 hour  Intake          1243.92 ml  Output             2275 ml  Net         -1031.08 ml     Physical Exam: General:  Looks tired, No resp difficulty HEENT: normal Cor: Mechanical heart sounds with LVAD hum present. Lungs: clear Incisions healing well Abdomen: soft, nontender, nondistended. No hepatosplenomegaly. No bruits or masses. Good bowel sounds. Extremities: mild edema Neuro: alert & orientedx3, cranial nerves grossly intact. moves all 4 extremities w/o difficulty. Affect pleasant  Telemetry: sinus 70's  Labs: Basic Metabolic Panel:  Recent Labs Lab 04/30/17 0315 05/01/17 0323 05/02/17 0346 05/02/17 1628 05/03/17 0300 05/04/17 0355 04/01/2017 0332  NA 129* 131* 133* 135 133* 134* 134*  K 3.8 3.9 3.8 4.2 4.2 3.9 3.8  CL 99* 101 105 101 104 107 107  CO2 21* 21* 21*  --  22 21* 21*  GLUCOSE 144* 151* 134* 230* 201* 218* 176*  BUN 88* 91* 89* 84* 83* 78* 80*  CREATININE 3.00*  2.59* 2.26* 1.80* 2.02* 1.92* 1.88*  CALCIUM 7.8* 7.7* 7.7*  --  7.6* 7.4* 7.3*  MG 1.9  --   --   --  1.9  --   --     Liver Function Tests:  Recent Labs Lab 05/04/17 0355  AST 21  ALT 11*  ALKPHOS 56  BILITOT 0.7  PROT 4.2*  ALBUMIN 1.7*   No results for input(s): LIPASE, AMYLASE in the last 168 hours. No results for input(s): AMMONIA in the last 168 hours.  CBC:  Recent Labs Lab 05/02/17 1535  05/03/17 0300 05/03/17 1500 05/03/17 2151 05/04/17 0355 04/01/2017 0332  WBC 8.9  --  7.7 11.8* 13.2* 12.2* 12.3*  NEUTROABS 7.2  --   --   --   --   --  10.5*  HGB 7.3*  < > 7.1* 9.1* 7.8* 7.9* 6.5*  HCT 21.7*  < > 21.1* 26.3* 22.9* 23.1* 19.1*  MCV 87.1  --  86.1 86.2 87.7 86.8 88.8  PLT 213  --  186 190 185 158 160  < > = values in this interval not displayed.  INR:  Recent Labs Lab 05/01/17 0323 05/02/17 0346 05/03/17 0300 05/04/17 0355 04/01/2017 0332  INR 2.28 2.95 3.58 3.94 3.22   LDH: 164  Other results:  EKG:   Imaging: Dg Chest Port 1 View  Result Date: 05/04/2017 CLINICAL DATA:  Followup bibasilar atelectasis. EXAM: PORTABLE CHEST 1 VIEW COMPARISON:  05/02/2017. FINDINGS: Poor inspiration. Stable enlarged cardiac silhouette and post CABG changes. The left ventricular assist device is unchanged as is a left subclavian AICD lead and right PICC. Clear lungs. Right shoulder fixation anchors. IMPRESSION: No acute abnormality. Electronically Signed   By: Beckie Salts M.D.   On: 05/04/2017 07:39      Medications:     Scheduled Medications: . amiodarone  400 mg Oral BID  . Chlorhexidine Gluconate Cloth  6 each Topical Daily  . febuxostat  40 mg Oral Daily  . feeding supplement (ENSURE ENLIVE)  237 mL Oral BID BM  . fentaNYL (SUBLIMAZE) injection  50 mcg Intravenous Once  . hydrALAZINE  25 mg Oral Q8H  . insulin aspart  0-15 Units Subcutaneous TID WC  . isosorbide mononitrate  30 mg Oral Daily  . pantoprazole (PROTONIX) IV  40 mg Intravenous Q12H  .  sodium chloride flush  10-40 mL Intracatheter Q12H  . sodium chloride flush  3 mL Intravenous Q12H     Infusions: . sodium chloride Stopped (04/19/17 1200)  . sodium chloride Stopped (04/21/17 1500)  . sodium chloride Stopped (05/02/17 0630)  . sodium chloride 5 mL/hr at 04/25/2017 0800  . lactated ringers Stopped (05/01/17 0810)  . milrinone 0.125 mcg/kg/min (05/20/2017 1206)  . phenylephrine (NEO-SYNEPHRINE) Adult infusion Stopped (05/02/17 2000)     PRN Medications:  albuterol, docusate, hydrALAZINE, neomycin-bacitracin-polymyxin, ondansetron (ZOFRAN) IV, oxyCODONE, phenol, sodium chloride flush, sodium chloride flush, traMADol   Assessment/Plan/Discussion:    POD 18s/p HVAD for ischemic cardiomyopathy with acute on chronic systolic heart failure, EF 15% with moderate RV dysfunction. Returned to OR for bleeding from raw mediastinal tissues. He has been hemodynamically stable on 0.125 milrinone with low CVP and this could probably be turned off with Co-ox of 74%. VAD speed turned down 7/3 for episodes of sustained VT.  Stage 3 CKD: creat improved to 1.88 after ATN related to surgery, ileus, vomiting, hypotension requiring pressors.  GI bleed: continues to have melena/maroon stool with drop in Hgb despite 2 unit transfusion the past two days. GI medicine has seen him and plans endoscopy when INR falls. He has had chronic anemia preop that was felt to be due to chronic disease but may have been some low grade GI blood loss now worsened with anticoagulation since INR rose so high.  Acute respiratory failure due to aspiration pneumonia resolved.  Ileus resolved  INR down to 3.2  No coumadin for now until after GI workup complete.  Will need heparin when he gets down to 2.  DM: preop Hgb A1c 5.9. Glucose under good control. ContinueLevemir and SSI.  Gout: observe  Hx of arrhythmias preop on amio and some VT postop, possibly due to suction event. Continue amiodarone.  ICD in  place but will wait to turn on therapies.   Severe malnutrition with prealbumin < 5: Encourage PO intake.   I reviewed the LVAD parameters from today, and compared the results to the patient's prior recorded data. LVAD equipment check completed andis in good working order. Back-up equipment present.   Length of Stay: [address]  Alleen Borne 04/30/2017, 12:07 PM

## 2017-05-05 NOTE — Progress Notes (Signed)
CSW met at bedside with patient who reports continued improvement. Patient is hopeful to move out of ICU as "it's getting tiresome". Patient states he is trying to stay positive and is motivated for recovery. CSW continues to follow for supportive needs. Raquel Sarna, [place], [place]

## 2017-05-05 NOTE — Care Management Note (Addendum)
Case Management Note  Patient Details  Name: James FabianHarry D [name] MRN: [id number] Date of Birth: Nov 30, 1960  Subjective/Objective:   LVAD implanted 6/28, conts with melena and maroon stools, hgb down, received 2 units of prbcs 7/15, CVP low this am, co-ox 74/5 conts on milrinone, INR elevated 3.22, GI following, plan for EGD when INR allows.  Discussed in LOS , appropriate for continued stay.   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    7/25 1014 [name] RN, BSN - patient is getting close to CIR, awaiting to see if will MD will clear for CIR. Patient back into VT in 120's, started on amio.   05/27/17- 1000- [name] RN, CM- pt has had multiple post op set backs/complications- including ileus, GIB, VT, and vol overload. CIR continues to follow for possible admission when medically stable- pt more lethargic today and not vocal- MD to consult Neurology. CM will continue to follow.                         Action/Plan: NCM will follow for dc needs.  Expected Discharge Date:                  Expected Discharge Plan:  IP Rehab Facility  In-House Referral:  NA  Discharge planning Services  CM Consult  Post Acute Care Choice:    Choice offered to:     DME Arranged:    DME Agency:     HH Arranged:    HH Agency:     Status of Service:  In process, will continue to follow  If discussed at Long Length of Stay Meetings, dates discussed:    Additional Comments:  [name], [name], RN 05/09/2017, 10:10 AM

## 2017-05-05 NOTE — Progress Notes (Signed)
Occupational Therapy Treatment Patient Details Name: James [name] MRN: [id number] DOB: [birth year] Today's Date: 05/04/2017    History of present illness  Pt adm with acute on chronic heart failure and underwent Heartware HVAD implant on 6/28. Returned to OR later that day for evacuation of mediastinal hematoma. On 7/4 developed ileus with likely aspiration and intubated 7/4-7/6. PMH - gout, DMII, HTN, CKD, CAD S/P CABGx6 2012, chronic systolic heart failure and St jude ICD. presents now s/p heartware HVAD implant   OT comments  This 56 yo male s/p HVAD presents to acute OT with independently being able to perform theraputty exercises with level 1 (tan) theraputty to increase grip strength in fingers to A him with ease of changing from battery to wall power.    Follow Up Recommendations  CIR;Supervision/Assistance - 24 hour    Equipment Recommendations  3 in 1 bedside commode       Precautions / Restrictions Precautions Precautions: Sternal Precaution Comments: Heartware HVAD Restrictions Weight Bearing Restrictions: Yes Other Position/Activity Restrictions: Sternal precautions              ADL either performed or assessed with clinical judgement   ADL Overall ADL's : Needs assistance/impaired                                       General ADL Comments: Issued pt a piece of red foam tubing to use with spoon/fork for eating. He says it may help him. He was able to remove and replace platic spoon in red tubing independently     Vision Patient Visual Report: No change from baseline            Cognition Arousal/Alertness: Awake/alert Behavior During Therapy: Flat affect Overall Cognitive Status: Within Functional Limits for tasks assessed                                          Exercises Other Exercises Other Exercises: Pt educated and returned demo pinch and pull of putty with Bil hands (lateral pinch and tripod pinch) as well as  verbalize understanding of flattening putty into a pancake between his palms. He was able to open and close putty container. I encouraged him to get the putty out and work with it 5-6 times a day.           Pertinent Vitals/ Pain       Pain Assessment: No/denies pain         Frequency  Min 2X/week        Progress Toward Goals  OT Goals(current goals can now be found in the care plan section)  Progress towards OT goals: Progressing toward goals     Plan Discharge plan remains appropriate       AM-PAC PT "6 Clicks" Daily Activity     Outcome Measure   Help from another person eating meals?: A Little Help from another person taking care of personal grooming?: A Little Help from another person toileting, which includes using toliet, bedpan, or urinal?: A Lot Help from another person bathing (including washing, rinsing, drying)?: A Lot Help from another person to put on and taking off regular upper body clothing?: A Lot Help from another person to put on and taking off regular lower body clothing?: A Lot 6 Click Score: 14  End of Session    OT Visit Diagnosis: Muscle weakness (generalized) (M62.81)   Activity Tolerance Patient tolerated treatment well   Patient Left in bed;with call bell/phone within reach           Time: 0812-0823 OT Time Calculation (min): 11 min  Charges: OT General Charges $OT Visit: 1 Procedure OT Treatments $Therapeutic Exercise: 8-22 mins  Ignacia Palma, OTR/L [phone] 05/11/2017

## 2017-05-06 ENCOUNTER — Encounter (HOSPITAL_COMMUNITY): Admission: AD | Disposition: E | Payer: Self-pay | Source: Ambulatory Visit | Attending: Surgery

## 2017-05-06 ENCOUNTER — Inpatient Hospital Stay (HOSPITAL_COMMUNITY): Payer: Medicare PPO

## 2017-05-06 DIAGNOSIS — K921 Melena: Secondary | ICD-10-CM

## 2017-05-06 DIAGNOSIS — K31811 Angiodysplasia of stomach and duodenum with bleeding: Secondary | ICD-10-CM

## 2017-05-06 DIAGNOSIS — D62 Acute posthemorrhagic anemia: Secondary | ICD-10-CM

## 2017-05-06 HISTORY — PX: ENTEROSCOPY: SHX5533

## 2017-05-06 HISTORY — PX: HOT HEMOSTASIS: SHX5433

## 2017-05-06 LAB — BASIC METABOLIC PANEL
Anion gap: 8 (ref 5–15)
BUN: 81 mg/dL — AB (ref 6–20)
CO2: 20 mmol/L — ABNORMAL LOW (ref 22–32)
CREATININE: 1.94 mg/dL — AB (ref 0.61–1.24)
Calcium: 7.5 mg/dL — ABNORMAL LOW (ref 8.9–10.3)
Chloride: 108 mmol/L (ref 101–111)
GFR calc Af Amer: 43 mL/min — ABNORMAL LOW (ref >60)
GFR, EST NON AFRICAN AMERICAN: 37 mL/min — AB (ref >60)
GLUCOSE: 229 mg/dL — AB (ref 65–99)
POTASSIUM: 4 mmol/L (ref 3.5–5.1)
SODIUM: 136 mmol/L (ref 135–145)

## 2017-05-06 LAB — CBC
HCT: 22.4 % — ABNORMAL LOW (ref 39.0–52.0)
Hemoglobin: 7.5 g/dL — ABNORMAL LOW (ref 13.0–17.0)
MCH: 29.1 pg (ref 26.0–34.0)
MCHC: 33.5 g/dL (ref 30.0–36.0)
MCV: 86.8 fL (ref 78.0–100.0)
PLATELETS: 178 10*3/uL (ref 150–400)
RBC: 2.58 MIL/uL — AB (ref 4.22–5.81)
RDW: 18.8 % — ABNORMAL HIGH (ref 11.5–15.5)
WBC: 17.5 10*3/uL — ABNORMAL HIGH (ref 4.0–10.5)

## 2017-05-06 LAB — BPAM RBC
BLOOD PRODUCT EXPIRATION DATE: 201807222359
BLOOD PRODUCT EXPIRATION DATE: 201807242359
BLOOD PRODUCT EXPIRATION DATE: 201807252359
BLOOD PRODUCT EXPIRATION DATE: 201807252359
Blood Product Expiration Date: 201807192359
Blood Product Expiration Date: 201807252359
Blood Product Expiration Date: 201807272359
Blood Product Expiration Date: 201807272359
Blood Product Expiration Date: 201807302359
Blood Product Expiration Date: 201807302359
Blood Product Expiration Date: 201807302359
ISSUE DATE / TIME: 201807130843
ISSUE DATE / TIME: 201807131124
ISSUE DATE / TIME: 201807131803
ISSUE DATE / TIME: 201807140742
ISSUE DATE / TIME: 201807141006
ISSUE DATE / TIME: 201807150007
ISSUE DATE / TIME: 201807151443
ISSUE DATE / TIME: 201807160544
ISSUE DATE / TIME: 201807160834
ISSUE DATE / TIME: 201807161741
UNIT TYPE AND RH: 6200
UNIT TYPE AND RH: 6200
UNIT TYPE AND RH: 6200
UNIT TYPE AND RH: 6200
UNIT TYPE AND RH: 6200
UNIT TYPE AND RH: 6200
Unit Type and Rh: 6200
Unit Type and Rh: 6200
Unit Type and Rh: 6200
Unit Type and Rh: 6200
Unit Type and Rh: 6200

## 2017-05-06 LAB — TYPE AND SCREEN
ABO/RH(D): A POS
ANTIBODY SCREEN: NEGATIVE
UNIT DIVISION: 0
UNIT DIVISION: 0
UNIT DIVISION: 0
Unit division: 0
Unit division: 0
Unit division: 0
Unit division: 0
Unit division: 0
Unit division: 0
Unit division: 0
Unit division: 0

## 2017-05-06 LAB — POCT I-STAT 4, (NA,K, GLUC, HGB,HCT)
GLUCOSE: 231 mg/dL — AB (ref 65–99)
HCT: 24 % — ABNORMAL LOW (ref 39.0–52.0)
HEMOGLOBIN: 8.2 g/dL — AB (ref 13.0–17.0)
POTASSIUM: 3.7 mmol/L (ref 3.5–5.1)
SODIUM: 137 mmol/L (ref 135–145)

## 2017-05-06 LAB — PREPARE RBC (CROSSMATCH)

## 2017-05-06 LAB — GLUCOSE, CAPILLARY
GLUCOSE-CAPILLARY: 213 mg/dL — AB (ref 65–99)
GLUCOSE-CAPILLARY: 165 mg/dL — AB (ref 65–99)
GLUCOSE-CAPILLARY: 217 mg/dL — AB (ref 65–99)
GLUCOSE-CAPILLARY: 176 mg/dL — AB (ref 65–99)
Glucose-Capillary: 153 mg/dL — ABNORMAL HIGH (ref 65–99)

## 2017-05-06 LAB — MAGNESIUM: MAGNESIUM: 1.8 mg/dL (ref 1.7–2.4)

## 2017-05-06 LAB — COOXEMETRY PANEL
CARBOXYHEMOGLOBIN: 1.8 % — AB (ref 0.5–1.5)
Methemoglobin: 1.2 % (ref 0.0–1.5)
O2 Saturation: 75.8 %
TOTAL HEMOGLOBIN: 7.7 g/dL — AB (ref 12.0–16.0)

## 2017-05-06 LAB — PROTIME-INR
INR: 2.2
INR: 2.17
PROTHROMBIN TIME: 24.8 s — AB (ref 11.4–15.2)
Prothrombin Time: 24.5 seconds — ABNORMAL HIGH (ref 11.4–15.2)

## 2017-05-06 LAB — LACTATE DEHYDROGENASE: LDH: 184 U/L (ref 98–192)

## 2017-05-06 SURGERY — ENTEROSCOPY
Anesthesia: Moderate Sedation

## 2017-05-06 MED ORDER — SODIUM CHLORIDE 0.9 % IJ SOLN
PREFILLED_SYRINGE | INTRAMUSCULAR | Status: DC | PRN
  Administered 2017-05-06: 12 mL

## 2017-05-06 MED ORDER — SODIUM CHLORIDE 0.9 % IV SOLN: Freq: Once | INTRAVENOUS | Status: AC

## 2017-05-06 MED ORDER — SODIUM CHLORIDE 0.9 % IV SOLN: 0.0000 ug/min | INTRAVENOUS | Status: DC

## 2017-05-06 MED ORDER — MAGNESIUM SULFATE 2 GM/50ML IV SOLN
2.0000 g | Freq: Once | INTRAVENOUS | Status: AC
  Administered 2017-05-06: 2 g via INTRAVENOUS
  Filled 2017-05-06: qty 50

## 2017-05-06 MED ORDER — ETOMIDATE 2 MG/ML IV SOLN
30.0000 mg | Freq: Once | INTRAVENOUS | Status: AC
  Administered 2017-05-06: 30 mg via INTRAVENOUS

## 2017-05-06 MED ORDER — MIDAZOLAM HCL 5 MG/ML IJ SOLN
INTRAMUSCULAR | Status: AC
  Filled 2017-05-06: qty 3

## 2017-05-06 MED ORDER — EPINEPHRINE PF 1 MG/10ML IJ SOSY
PREFILLED_SYRINGE | INTRAMUSCULAR | Status: AC
Start: 2017-05-06 — End: 2017-05-06
  Filled 2017-05-06: qty 10

## 2017-05-06 MED ORDER — MIDAZOLAM HCL 2 MG/2ML IJ SOLN: 2.0000 mg | INTRAMUSCULAR | Status: DC | PRN

## 2017-05-06 MED ORDER — MIDAZOLAM HCL 10 MG/2ML IJ SOLN
INTRAMUSCULAR | Status: DC | PRN
  Administered 2017-05-06 (×3): 2 mg via INTRAVENOUS
  Administered 2017-05-06: 1 mg via INTRAVENOUS
  Administered 2017-05-06: 2 mg via INTRAVENOUS
  Administered 2017-05-06: 1 mg via INTRAVENOUS

## 2017-05-06 MED ORDER — BUTAMBEN-TETRACAINE-BENZOCAINE 2-2-14 % EX AERO
INHALATION_SPRAY | CUTANEOUS | Status: DC | PRN
  Administered 2017-05-06: 2 via TOPICAL

## 2017-05-06 MED ORDER — NOREPINEPHRINE BITARTRATE 1 MG/ML IV SOLN
0.0000 ug/min | INTRAVENOUS | Status: DC
  Administered 2017-05-06: 12 ug/min via INTRAVENOUS
  Administered 2017-05-06: 11 ug/min via INTRAVENOUS
  Administered 2017-05-06: 7 ug/min via INTRAVENOUS
  Administered 2017-05-07: 2 ug/min via INTRAVENOUS
  Filled 2017-05-06 (×3): qty 4

## 2017-05-06 MED ORDER — AMIODARONE IV BOLUS ONLY 150 MG/100ML
150.0000 mg | Freq: Once | INTRAVENOUS | Status: AC
  Administered 2017-05-06: 150 mg via INTRAVENOUS

## 2017-05-06 MED ORDER — FENTANYL CITRATE (PF) 100 MCG/2ML IJ SOLN
50.0000 ug | Freq: Once | INTRAMUSCULAR | Status: AC
  Administered 2017-05-06: 50 ug via INTRAVENOUS

## 2017-05-06 MED ORDER — FENTANYL CITRATE (PF) 100 MCG/2ML IJ SOLN
INTRAMUSCULAR | Status: AC
  Filled 2017-05-06: qty 2

## 2017-05-06 MED ORDER — SODIUM CHLORIDE 0.9 % IV SOLN: INTRAVENOUS | Status: AC

## 2017-05-06 MED ORDER — FENTANYL CITRATE (PF) 100 MCG/2ML IJ SOLN
INTRAMUSCULAR | Status: AC
  Filled 2017-05-06: qty 4

## 2017-05-06 MED ORDER — SPOT INK MARKER SYRINGE KIT
PACK | SUBMUCOSAL | Status: AC
  Filled 2017-05-06: qty 5

## 2017-05-06 MED ORDER — MIDAZOLAM HCL 2 MG/2ML IJ SOLN
2.0000 mg | INTRAMUSCULAR | Status: DC | PRN
  Administered 2017-05-06: 2 mg via INTRAVENOUS
  Filled 2017-05-06: qty 2

## 2017-05-06 MED ORDER — FENTANYL 2500MCG IN NS 250ML (10MCG/ML) PREMIX INFUSION
25.0000 ug/h | INTRAVENOUS | Status: DC
  Administered 2017-05-06: 100 ug/h via INTRAVENOUS
  Administered 2017-05-07: 200 ug/h via INTRAVENOUS
  Filled 2017-05-06 (×2): qty 250

## 2017-05-06 MED ORDER — SODIUM CHLORIDE 0.9 % IV SOLN
INTRAVENOUS | Status: DC
  Administered 2017-05-06 – 2017-05-07 (×2): via INTRAVENOUS

## 2017-05-06 MED ORDER — FENTANYL BOLUS VIA INFUSION
50.0000 ug | INTRAVENOUS | Status: DC | PRN
  Filled 2017-05-06: qty 50

## 2017-05-06 MED ORDER — MEXILETINE HCL 200 MG PO CAPS
200.0000 mg | ORAL_CAPSULE | Freq: Three times a day (TID) | ORAL | Status: DC
  Administered 2017-05-06 – 2017-05-07 (×4): 200 mg via ORAL
  Filled 2017-05-06 (×4): qty 1

## 2017-05-06 MED ORDER — FENTANYL CITRATE (PF) 100 MCG/2ML IJ SOLN
INTRAMUSCULAR | Status: DC | PRN
  Administered 2017-05-06: 25 ug via INTRAVENOUS
  Administered 2017-05-06: 37.5 ug via INTRAVENOUS
  Administered 2017-05-06: 25 ug via INTRAVENOUS
  Administered 2017-05-06: 12.5 ug via INTRAVENOUS
  Administered 2017-05-06: 50 ug via INTRAVENOUS

## 2017-05-06 SURGICAL SUPPLY — 14 items

## 2017-05-06 NOTE — Progress Notes (Signed)
LVAD Inpatient Coordinator Rounding Note:  Admitted 04/07/2017 due to A/C Heart failure.   HeartWare LVAD implanted on 04/09/2017 by Dr. Laneta SimmersBartle as DT VAD.  Vital signs: HR: 78 Doppler Pressure: 70 BP:84/71  O2 Sat: 99on Room Air Wt:207>209 > 213 >213 > 208>203>220>227lbs>226>225>224>224>225>223>225   LVAD interrogation reveals:  Speed: 2660- decreased today due to wide complex tachycardia Flow: 4.2 Power: 4.3 Alarms:none Peak: 5.1 Trough: 3.4 HCT: 22 Low flow alarm setting: 3.4 High watt alarm setting: 6.5  Suction: Turned off  Lavare cycle: on  Blood Products: 6/28> 5 PRBC's, 6 FFP 7/1> 1 PRBC 7/9> 1 PRBC 7/13> 3 PRBC 7/14>3 PRBC 7/15> 1 PRBC 7/16> 3 PRBC 7/17>2 PRBC  Gtts: Milrinone restarted 7/8 currently @ 0.125 mcg/kg/min Levo stopped 04/26/17 Amiodarone stopped 04/29/17, restarted 7/17 continuous at 60 mg/hr Heparin stopped 04/29/17  TPN - started 04/24/17 for nutritional support, stopped 04/30/17  Arrhythmia: 04/24/17 - Afib with RVR - started amiodarone, now PO 7/17- wide complex tachycardia- Amio bolus x3 and gtt at 60 mg/hr  Respiratory: 04/23/17 - re-intubated due to respiratory failure secondary to suspected aspiration pneumonia 04/25/17-extubated  Drive Line: Daily Dressing Kits with Aquacell AG silver strips per protocol.   Labs:  LDH trend:160 (pre VAD)>227>215>191>212>207>222>217>219>163>207>217>200>183>161>164>184  INR trend: 1.31>1.45>1.44>1.25>1.33>1.68>2.61>2.4>1.81>2.19>2.09>2.28>3.58>3.94>3.22>2.17  Anticoagulation Plan: -INR Goal: 2-2.5  -ASA Dose: 325 mg- on hold  Adverse Events on VAD: -Return to OR 6/28 with high chest tube output, evacuation of mediastinal hematoma -Ileus with vomiting on 04/23/17; re-intubation for acute respiratory failure; probable aspiration pneumonia -7/13-GI bleed  Plan/Recommendations:  1. Transfuse blood per orders 2. Please continue daily dressing changes and include patients wife  when visiting. 3. EGD at bedside today  Marcellus ScottLesley Wilson RN, VAD Coordinator 24/7 pager [phone]

## 2017-05-06 NOTE — Progress Notes (Signed)
VAD Coordinator Procedure Note:   Patient underwent EGD at bedside. Hemodynamics and VAD parameters monitored by me throughout the procedure. MAPs were obtained with left radial arterial line placed before the procedure.          A-line   Flow:      Peak/trough:   Power:     Speed:    Time:           Pre-procedure:87/74 (81) 5.3  7/3     4.2     2660   1513  Sedation Induction:87/79(79)      4.9  6.5/3.4     4.0     2660   1520            94/72 (78)    5.1  3/7     4.1     2660   1530            93/71 (77)    5.2    7.2/3.8     4.1     2660   1540          115/98(84)     5.1  7.2/4     4.1     2660   1550            82/73(74)     4.8  6/3.8     4.0     2660   1600              104/78 (95)  4.9  7.2/2     4.0     2660   1610              96/78(84)   5.1  7.4/3     4.1     2660   1620   Interventions:  Pt was given epi, APC, and 2 clips in an 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 Levophed was titrated up to 30mcg during the procedure and 1 unit of PRBCs were also given. During procedure pt required intubation to protect airway while the bleeding was stopped. Critical Care was at the bedside during this time.   Recovery area:     102/74 (82) 5.0   7/3      4.0     2660   1630    Patient Disposition: pts wife was updated by Dr. Shirlee LatchMclean and Dr. Adela LankArmbruster after the case. Nurse was instructed to try to wean off the levophed gtt. Pts v/s were stable. We appreciate Critical Care's help with this difficult case.

## 2017-05-06 NOTE — Progress Notes (Signed)
Patient went into VT with a rate of 138 bpm. Had received IV amiodarone, mexiletine, and 2 U PRBC. No chest pain but feeling weak and hypotensive. St. Jude ICD was interrogated by me and confrmed VT. Underwent ATP with return to sinus rhythm pacing at 250 msec.   Loman BrooklynWill Hazeline Charnley, MD

## 2017-05-06 NOTE — Progress Notes (Signed)
ANTICOAGULATION CONSULT NOTE - Follow Up Consult  Pharmacy Consult for Warfarin > holding, Heparin when INR < 1.8 Indication: HVAD  No Active Allergies  Patient Measurements: Height: 6' (182.9 cm) Weight: 225 lb (102.1 kg) IBW/kg (Calculated) : 77.6  Vital Signs: Temp: 97.8 F (36.6 C) (07/17 1336) Temp Source: Oral (07/17 1336) BP: 84/71 (07/17 0545) Pulse Rate: 70 (07/17 1354)  Labs:  Recent Labs  05/09/2017 0332 05/03/2017 1414 05/13/2017 2104 05/18/2017 0402 04/24/2017 1130  HGB 6.5* 7.4* 8.5* 7.5*  --   HCT 19.1* 21.9* 24.9* 22.4*  --   PLT 160 162 166 178  --   LABPROT 33.6*  --   --  24.8* 24.5*  INR 3.22  --   --  2.20 2.17  CREATININE 1.88*  --  1.87* 1.94*  --     Estimated Creatinine Clearance: 53.8 mL/min (A) (by C-G formula based on SCr of 1.94 mg/dL (H)).   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 He developed an ileus on 7/5, warfarin was held, and heparin bridge started once INR fell below 1.8.  Heparin stopped 7/10 with INR 2.19. On 7/13, Hgb dropped and he had melena - warfarin held again and pharmacy asked to start heparin once INR < 1.8 pending GI workup. INR down to 2.2 today. Plan for EGD this afternoon. Hgb down again 8.5>7.5 s/p 2 units yesterday, he'll receive 2 more today.  Goal of Therapy:  Heparin level 0.3-0.5 INR goal 2-3 Monitor platelets by anticoagulation protocol: Yes   Plan:  1) Continue to hold warfarin and aspirin 2) Daily INR - start heparin when < 1.8, aim for lower goal  Louie CasaJennifer Neilan Rizzo, PharmD, BCPS 05/02/2017 1:58 PM

## 2017-05-06 NOTE — Progress Notes (Signed)
Arterial line BP monitored continuously during EGD by VAD coordinator Sarah,  at bedside throughout.

## 2017-05-06 NOTE — Progress Notes (Signed)
PULMONARY / CRITICAL CARE MEDICINE   Name: James [name] MRN: [id number] DOB: Aug 07, 1961    ADMISSION DATE:  03/27/2017 CONSULTATION DATE:  04/23/2017  REFERRING MD:  Laneta Simmers - CVTS  CHIEF COMPLAINT:  Acute respiratory failure  BRIEF SUMMARY:   [name] with CHF (EF 15%), AICD, CAD, Gout, HTN, CKD, DM, Anemia, admitted 03/29/2017 for VAD placement, as he had already been turned down by [facility] for transplant due to his gout and limitations with his mobility. Now s/p HVAD placement for his ischemic cardiomyopathy. His course has been complicated by AKI-on-CKD, and also complicated by pre-op Afib with RVR. He developed vomiting of feculent material and was found to have an ileus. An NG tube was placed to suction. However, patient then developed acute hypoxic respiratory failure requiring 15L O2 NRB, and he developed worsening respiratory distress, prompting ICU consult.  He was intubated for respiratory arrest, aspiration PNA > developed septic shock / required vasopressors.   Extubated 7/6.    SUBJECTIVE:  PCCM initially signed off 7/7. Since that time he continued to require milrinone and developed hematochezia with resulting anemia. He was then scheduled for endoscopy, however, INR too high for procedure due to need for VAD. He was able to get EGD 7/17. CCM was asked to assist due to concerns with sedation.   VITAL SIGNS: BP 108/79   Pulse (!) 135   Temp (P) 97.6 F (36.4 C) (Axillary)   Resp (!) 33   Ht 6' (1.829 m)   Wt 102.1 kg (225 lb)   SpO2 100%   BMI 30.52 kg/m   HEMODYNAMICS: CVP:  [2 mmHg-11 mmHg] 3 mmHg  VENTILATOR SETTINGS: FiO2 (%):  [100 %] (P) 100 %  INTAKE / OUTPUT: I/O last 3 completed shifts: In: 2419.4 [P.O.:590; I.V.:809.4; Blood:970; IV Piggyback:50] Out: 2600 [Urine:2600]  PHYSICAL EXAMINATION: General:  HEENT:  PSY:  Neuro:  CV: PULM: GI: Extremities:  Skin:    LABS:  BMET  Recent Labs Lab 05/11/2017 0332 04/22/2017 2104 05/10/2017 0402  NA 134* 136 136   K 3.8 4.0 4.0  CL 107 108 108  CO2 21* 20* 20*  BUN 80* 78* 81*  CREATININE 1.88* 1.87* 1.94*  GLUCOSE 176* 217* 229*   Electrolytes  Recent Labs Lab 04/30/17 0315  05/03/17 0300  05/11/2017 0332 05/10/2017 2104 05/11/2017 0402  CALCIUM 7.8*  < > 7.6*  < > 7.3* 7.5* 7.5*  MG 1.9  --  1.9  --   --   --  1.8  < > = values in this interval not displayed. CBC  Recent Labs Lab 05/13/2017 1414 05/09/2017 2104 05/11/2017 0402  WBC 13.9* 17.4* 17.5*  HGB 7.4* 8.5* 7.5*  HCT 21.9* 24.9* 22.4*  PLT 162 166 178   Coag's  Recent Labs Lab 04/23/2017 0332 04/30/2017 0402 04/25/2017 1130  INR 3.22 2.20 2.17   Sepsis Markers No results for input(s): LATICACIDVEN, PROCALCITON, O2SATVEN in the last 168 hours. ABG No results for input(s): PHART, PCO2ART, PO2ART in the last 168 hours. Liver Enzymes  Recent Labs Lab 05/04/17 0355  AST 21  ALT 11*  ALKPHOS 56  BILITOT 0.7  ALBUMIN 1.7*    Cardiac Enzymes No results for input(s): TROPONINI, PROBNP in the last 168 hours.  Glucose  Recent Labs Lab 05/01/2017 1122 05/05/2017 1557 04/20/2017 2120 05/20/2017 0800 05/08/2017 1146 05/06/17 1223  GLUCAP 177* 204* 214* 213* 153* 165*    Imaging No results found.   STUDIES:  None  CULTURES: Blood 7/5 >> neg  Sputum 7/5 >>klebsieall, citrobacter Sputum 7/7 >> klebsiella - resistant to amp otherwise sens  ANTIBIOTICS: Vancomycin 7/5 >> 7/7 Zosyn 7/5 >> 7/12  SIGNIFICANT EVENTS: 7/02  PRBC 7/05  Respiratory arrest post feculent vomitus episode 7/06  Extubated   LINES/TUBES: ETT 7/5 >> 7/6 Double lumen PICC on the right 7/2 >>  ASSESSMENT / PLAN: [name] with CHF (EF 15%), AICD, CAD, Gout, HTN, CKD, DM, Anemia, admitted 04/02/2017 for VAD placement, now POD 7 s/p HVAD placement, with course complicated by AKI-on-CKD and pre-op Afib with RVR. Overnight 7/5 he developed feculent vomiting related to an ileus with subsequent acute hypoxic respiratory failure with severe respiratory distress  due to aspiration pneumonia, requiring intubation, & associated development of septic shock.  Extubated 7/6. PCCM signed off. Course complicated by refractory VT and ABLA due to GIB. Scoped 7/17 once INR down, large amount of blood noted and patient intubated to facilitate procedure.   PULMONARY A: Inability to protect airway in peri-operative setting Aspiration PNA (resolved) P: STAT intubation during EGD Full vent support CXR ABG VAP bundle Will assess for extubation 7/18 AM  CARDIOVASCULAR A: Acute on Chronic Systolic CHF / ICM - St. 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 I have discussed the case with Henreitta LeberP Hoffman, and I agree with the data and plans as amended above. 56 yo man, known to our service, hx Ef 15%, AICD, HTN. Is is s/p VAD placement this admission. Course c/b ARF, AFib, aspiration PNA and septic shock (improved), and then persistent VFib. He has had acute GI bleeding, a likely contributor to his persistent arrhythmia. We were asked to assist with sedation and then intubation for airway protection so he could undergo EGD 7/17. He was found to have an AVM in the small bowel that was cauterized. Post procedure he is still sedated. On my eval he will open eyes to voice (before sedation he was speaking and interacting appropriately). Lungs are clear bilaterally. His heart is regular w rate 130's. LVAD noises heard. Telemetry shows a wide complex tachycardia. Abdomen benign.   We will work on vent management, lighten sedation on 7/18.  Depending on MS, hemodynamic stability, WOB he will hopefully be able to perform SBT and extubate quickly. We will coordinate the extubation with plans of multiple consultants - TCTS, cardiology / advanced CHF, GI, etc.    Independent critical care time is 45 minutes.   Levy Pupaobert Cruzita Lipa, MD, PhD 05/19/2017, 6:17 PM [facility] [phone] or if no answer [phone]

## 2017-05-06 NOTE — Progress Notes (Signed)
Dr. Shirlee LatchMcLean updated on pt's vitals (HR 140's wide complex tachycardia), labs (Hbg 7.5, INR 2.2), and VAD numbers. Verbal order received to maintain NPO status and update as needed.

## 2017-05-06 NOTE — Progress Notes (Signed)
Patient ID: James [name], male   DOB: [birth year], 56 y.o.   MRN: [id number]  EP Attending  Called to see patient who was hypotensive and back in VT at 145/min. exam reveals an anxious appearing man, NAD. CV with a regular tachycardia. No increased work of breathing. Extremities are warm. Neuro intact. Tele with a wide QRS tachycardia. No chest pain. His St. Jude ICD was interogated confirming VT and he underwent several rounds of ATP by me with return to NSR with V pacing at 250 ms. He will need to be bolused with IV amiodarone.   Leonia ReevesGregg Taylor,M.D.

## 2017-05-06 NOTE — Progress Notes (Signed)
Patient ID: James FabianHarry D Chauca, male   DOB: 1961/01/23, 56 y.o.   MRN: [id number]  This NP visited patient at the bedside and placed call to [name] as a follow up for palliative needs and emotional support.  [name] is weak today and GI bleed remains an issue, plan for endoscopy today.   Spoke to wife and updated her  on current condition.  She plans to come to the hospital today in the afternoon.    Questions and concerns addressed.   Discussed with Dr Shirlee LatchMcLean  Discussed with Campbell LernerMarcie Swicegood that Yong Channellicia Parker will be available from the PMT for any needs Wednesday  thru Friday.  She has team phone contact information.  Time in  0900         Time out    0920  Total time spent on the unit was 20 minutes  Greater than 50% of the time was spent in counseling and coordination of care  Lorinda CreedMary Carsynn Bethune NP  Palliative Medicine Team Team Phone # [phone] Pager [phone]

## 2017-05-06 NOTE — Progress Notes (Signed)
CT surgery p.m. Rounds  Patient currently intubated and sedated He required intubation for protection of airway during EGD and removal of large following the blood from small bowel with cautery and clipping of small intestinal AVM  P.m. hemoglobin 8.2 Good pulsatility on VAD flow Holding Coumadin this p.m.

## 2017-05-06 NOTE — Progress Notes (Signed)
GI UPDATE (procedure note pending)  Patient underwent endoscopy - there was a significant amount of retained food in the stomach but no evidence of pyloric stenosis / outlet obstruction. No blood in the stomach. Red blood and clots in the 2nd portion of the duodenum. Patient was electively intubated for the procedure per critical care once these findings were noted. Once intubate the exam resumed. Blood, clots, were lavaged until active bleeding appreciated in the 2nd portion of the duodenum from a suspected AVM. It was injected with epinephrine which slightly slowed it but did not stop it. APC was then applied and hemostasis achieved. The site was then closed with two hemostasis clips and to mark the site in case rebleeding occurs. Please keep patient NPO at this time, serial Hgb. Formal note to follow.  Ileene PatrickSteven Armbruster, MD [facility] Pager [phone]

## 2017-05-06 NOTE — Progress Notes (Addendum)
Patient ID: James [name], male   DOB: 08-Jun-1961, 56 y.o.   MRN: [id number] \  Advanced Heart Failure VAD Team Note  Subjective:    HVAD placed 6/28.  Returned to the OR that evening with high chest tube output, evacuation of mediastinal hematoma.   Extubated 6/29. Milrinone stopped on 7/4.  Evening of 7/4, patient developed ileus with respiratory compromise. NGT placed with 3 L suctioned out.  CXR with suspicion for aspiration PNA.  Patient had to be intubated.  He went into atrial fibrillation with RVR.  He became hypotensive and was started on norepinephrine and phenylephrine.  Amiodarone gtt begun.     Extubated again on 7/6.   On 7/13 Had 2 sustained episodes of VT. Had to be cardioverted 1. Second episode broke with overdrive pacing with Dr. Graciela Husbands. VAD speed turned down to 2700.   Continues to have melena/maroon stool.  2 units PRBCs 7/14, 2 units 7/15.  Received 3 U PRBCs 7/16.   Last night he went back in to VT.  Milrinone stopped.  Back on amiodarone 60 mg per hour. S/p ATP for VT at 730 but back in VT shortly afterwards. MAP remains in 70s despite VT.  No suction alarms.   Complaining of fatigue. Denies SOB  Hemoglobin 9.1->7.8-> 7.9 -> 6.5->8.5->7.5   INR 3.9 => 3.22  => 2.2   HVAD INTERROGATION:  HVAD:  Flow 4.4 liters/min, speed 2700, power 4.3 W,  Peak 7 Trough 3.5 Suction off. Lav[place] On.  Interrogation is while patient was in NSR after initial overdrive pacing this morning.   Objective:    Vital Signs:   Temp:  [97.7 F (36.5 C)-99.1 F (37.3 C)] 98.2 F (36.8 C) (07/17 0307) Pulse Rate:  [45-145] 142 (07/17 0545) Resp:  [19-41] 32 (07/17 0545) BP: (71-108)/(23-91) 84/71 (07/17 0545) SpO2:  [90 %-100 %] 100 % (07/17 0545) Weight:  [225 lb (102.1 kg)] 225 lb (102.1 kg) (07/17 0430) Last BM Date: 05-17-17 Mean arterial Pressure 70s  Intake/Output:   Intake/Output Summary (Last 24 hours) at 05/05/2017 0729 Last data filed at 05/07/2017 0500  Gross per 24 hour    Intake          2318.38 ml  Output             1950 ml  Net           368.38 ml     Physical Exam   CVP 2 Physical Exam: GENERAL: chronically ill appearing. NAD HEENT: normal  NECK: Supple, JVP flat.  2+ bilaterally, no bruits.  No lymphadenopathy or thyromegaly appreciated.   CARDIAC:  Mechanical heart sounds with LVAD hum present.  LUNGS:  Clear to auscultation bilaterally.  ABDOMEN:  Soft, round, nontender, positive bowel sounds x4.     LVAD exit site: well-healed and incorporated.  Dressing dry and intact.  No erythema or drainage.  Stabilization device present and accurately applied.  Driveline dressing iintact EXTREMITIES:  Warm and dry, no cyanosis, clubbing, rash or  R and LLE  edema  NEUROLOGIC:  Alert and oriented x 4.  Gait steady.  No aphasia.  No dysarthria.  Affect flat.         Telemetry   VT most of the night 140s.. Back in NSR after ATP--> back in VT  Labs   Basic Metabolic Panel:  Recent Labs Lab 04/30/17 0315  05/03/17 0300 05/04/17 0355 17-May-2017 0332 05/17/2017 2104 05/18/2017 0402  NA 129*  < > 133* 134* 134* 136  136  K 3.8  < > 4.2 3.9 3.8 4.0 4.0  CL 99*  < > 104 107 107 108 108  CO2 21*  < > 22 21* 21* 20* 20*  GLUCOSE 144*  < > 201* 218* 176* 217* 229*  BUN 88*  < > 83* 78* 80* 78* 81*  CREATININE 3.00*  < > 2.02* 1.92* 1.88* 1.87* 1.94*  CALCIUM 7.8*  < > 7.6* 7.4* 7.3* 7.5* 7.5*  MG 1.9  --  1.9  --   --   --  1.8  < > = values in this interval not displayed.  Liver Function Tests:  Recent Labs Lab 05/04/17 0355  AST 21  ALT 11*  ALKPHOS 56  BILITOT 0.7  PROT 4.2*  ALBUMIN 1.7*   No results for input(s): LIPASE, AMYLASE in the last 168 hours. No results for input(s): AMMONIA in the last 168 hours.  CBC:  Recent Labs Lab 05/02/17 1535  05/04/17 0355 05/13/2017 0332 04/21/2017 1414 05/04/2017 2104 05/01/2017 0402  WBC 8.9  < > 12.2* 12.3* 13.9* 17.4* 17.5*  NEUTROABS 7.2  --   --  10.5*  --   --   --   HGB 7.3*  < > 7.9* 6.5*  7.4* 8.5* 7.5*  HCT 21.7*  < > 23.1* 19.1* 21.9* 24.9* 22.4*  MCV 87.1  < > 86.8 88.8 87.6 86.5 86.8  PLT 213  < > 158 160 162 166 178  < > = values in this interval not displayed.  INR:  Recent Labs Lab 05/02/17 0346 05/03/17 0300 05/04/17 0355 04/29/2017 0332 04/29/2017 0402  INR 2.95 3.58 3.94 3.22 2.20    Other results:     Imaging   No results found.   Medications:     Scheduled Medications: . amiodarone  400 mg Oral BID  . Chlorhexidine Gluconate Cloth  6 each Topical Daily  . febuxostat  40 mg Oral Daily  . feeding supplement  1 Container Oral BID BM  . feeding supplement (ENSURE ENLIVE)  237 mL Oral Q24H  . fentaNYL (SUBLIMAZE) injection  50 mcg Intravenous Once  . hydrALAZINE  25 mg Oral Q8H  . insulin aspart  0-15 Units Subcutaneous TID WC  . isosorbide mononitrate  30 mg Oral Daily  . pantoprazole (PROTONIX) IV  40 mg Intravenous Q12H  . sodium chloride flush  10-40 mL Intracatheter Q12H  . sodium chloride flush  3 mL Intravenous Q12H    Infusions: . sodium chloride Stopped (04/19/17 1200)  . sodium chloride Stopped (04/21/17 1500)  . sodium chloride Stopped (05/02/17 0630)  . sodium chloride 5 mL/hr at 04/20/2017 0500  . sodium chloride    . amiodarone 60 mg/hr (04/25/2017 0500)  . lactated ringers Stopped (05/01/17 0810)  . magnesium sulfate 1 - 4 g bolus IVPB    . milrinone Stopped (04/23/2017 2238)  . phenylephrine (NEO-SYNEPHRINE) Adult infusion Stopped (05/02/17 2000)    PRN Medications: albuterol, docusate, hydrALAZINE, neomycin-bacitracin-polymyxin, ondansetron (ZOFRAN) IV, oxyCODONE, phenol, sodium chloride flush, sodium chloride flush, traMADol   Patient Profile   56 yo with CAD s/p CABG, ischemic cardiomyopathy/chronic systolic CHF, tophaceous gout, and CKD stage 3 was admitted for diuresis and consideration for LVAD placement. S/p HVAD on 6/28  Assessment/Plan:    1. Acute/chronic systolic CHF s/p HVAD: Ischemic cardiomyopathy.  St  Jude ICD.  Echo (6/18) with EF 15%, mildly dilated RV with moderately decreased systolic function.  s/p HVAD placement 6/28 and had to return to OR to  evacuate mediastinal hematoma.  He had been weaned off pressors/milrinone, but developed ileus w/ likely aspiration event 7/4, re-intubated, developed afib/RVR requiring norepinephrine. Extubated 7/6. Has LE edema but CVP remains low.  Diuretics on hold due to recent suction events/VT and low CVP. VAD speed turned down to 2700 with VT. VAD parameters currently look good.  Milrinone stopped 7/17 early am with recurrent VT, co-ox 76% today.  CVP 2 and having bloody bowel movements.  - Hold diuretics. Needs volume, will get 2 units PRBCs today.  - Hold hydralazine/Imdur until MAP in the high 80s.  - Off ASA and coumadin with GI bleed. INR 3.22.  - No dig/spironolactone/arb with elevated creatinine.    2. AKI on CKD stage 3: Suspect a component of peri-op ATN, worsened with ileus, vomiting, intubation/sedation.  Creatinine stable at 1.9. Keep Map >70 and <90 3. Symptomatic anemia due to acute UGI bleeding:  Having ongoing melena/maroon stool. ASA/warfarin on hold. Received 3 more units PRBCs yesterday. Today's hgb down to 7.5.  - Give another 2 U PRBCs now.  - INR remains elevated 3.6-> 3.9 -> 3.22 -> 2.2 off coumadin.  - NPO today for possible endoscopy.  Will discuss with GI, hopefully can arrange this afternoon with anesthesiology.  - Continue IV Protonix 4. Ventricular tachycardia: Status post ventricular tachycardia 2 on 7/13. Possible suction event. VAD speed turned down to 2700.  Recurrent VT 7/17. EP called to bedside. Overdrive pacing successful for about 10 minutes but VT recurred.  Now off milrinone. VT may be triggered at this point by low CVP/low volume in LV. He is currently hemodynamically stable in VT with MAP 70s.  - Continue amio drip 60 mg per hour.  - Give 2 grams Mag now.  - Add mexiletine 200 mg every 8 hours.  - Re-attempt overdrive  pacing after he gets more volume => give 2 units PRBCs now.  5. CAD: s/p CABG 2012: No s/s of ischemia. Off aspirin due to GI bleeding. 6. Gout: Severe tophaceous gout. No complaint of gouty pain today.  - Continue uloric. Holding colchicine with AKI.     7. Atrial fibrillation: Developed atrial fibrillation with RVR in setting of aspiration PNA on 7/4.  He has been in NSR.  - Continue IV amio .  8. Malnutrition: Now off TPN. Appetite is improving. Encouraged po intake 9. Aspiration PNA with acute hypoxemic respiratory failure on 7/4: He was extubated on 7/6. Tracheal aspirate with Klebsiella and citrobacter. Both sensitive to Zosyn.  - Completed abx 7/13  10. Ileus: Resolved 11. Deconditioning:  PT following. CIR following. Continue ambulation in halls. Continue IS.   I reviewed the HVAD parameters from today, and compared the results to the patient's prior recorded data.  No programming changes were made.  The HVAD is functioning within specified parameters.     Tonye Becket, NP 05/05/2017, 7:29 AM  VAD Team --- VAD ISSUES ONLY--- Pager [phone] (7am - 7am) Advanced Heart Failure Team  Pager [phone] (M-F; 7a - 4p)  Please contact [facility] for night-coverage after hours (4p -7a ) and weekends on [email or web address]  Patient seen with NP, agree with the above note.  I performed exam and full evaluation in addition to the NP.  I interogated the HVAD.    Patient went back into VT last night, possibly due to low volume in LV with ongoing GI bleeding.  Amiodarone transitioned to IV and milrinone stopped.  We were able to pace him out of VT, but he  went back into VT after about 10 minutes.  MAP 70s still.  - I think he needs more volume with CVP 2 => will get 2 units PRBCs now.  After that, will re-attempt overdrive pacing . - Add mexiletine.   Co-ox stable at 76% off milrinone.  Hold diuretics.    Ongoing GI bleeding.  INR 2.2 early this morning.  GI to see, ideally would have endoscopy this  afternoon at bedside when back in NSR.    45 minutes critical care time.   Marca Ancona 05/08/2017 9:05 AM

## 2017-05-06 NOTE — Progress Notes (Addendum)
Patient ID: James [name], male   DOB: 09/06/1961, 56 y.o.   MRN: [id number] HVAD Rounding Note  Subjective:    No specific complaints today. Has continued to have melena/maroon stool per nurses and received 3 units PRBC's yesterday with rise in Hgb from 7.4 in the morning to 8.5 last night but back down to 7.5 this am.  Went back into VT last night. Received additional amiodarone and back on drip at 60. He was paced out of VT but did not hold long. Hemodynamics have been stable despite VT.   Milrinone stopped last night due to VT and Co-ox this am still 76%.  CVP has been low at 2-3 and transfusing more VAD speed decreased to 2660 this am.  LVAD INTERROGATION:  HVAD:  Flow 4.1 liters/min, speed 2660, power 4  Objective:    Vital Signs:   Temp:  [97.6 F (36.4 C)-99.1 F (37.3 C)] 97.8 F (36.6 C) (07/17 1336) Pulse Rate:  [33-145] 70 (07/17 1354) Resp:  [19-41] 23 (07/17 1354) BP: (71-108)/(23-91) 84/71 (07/17 0545) SpO2:  [90 %-100 %] 100 % (07/17 1354) Weight:  [102.1 kg (225 lb)] 102.1 kg (225 lb) (07/17 0430) Last BM Date: 04/28/2017 Mean arterial Pressure 70  Intake/Output:   Intake/Output Summary (Last 24 hours) at 04/21/2017 1428 Last data filed at 04/23/2017 1336  Gross per 24 hour  Intake          2592.89 ml  Output             1125 ml  Net          1467.89 ml     Physical Exam: General:  Tired appearing. No resp difficulty HEENT: normal Cor: distant heart sounds with LVAD hum present. Lungs: clear Abdomen: soft, nontender, nondistended.  Good bowel sounds. Extremities: no cyanosis, clubbing, rash, edema Neuro: alert & orientedx3, cranial nerves grossly intact. moves all 4 extremities w/o difficulty. Affect pleasant but down in the dumps.  Telemetry: VT 130's to 140's  Labs: Basic Metabolic Panel:  Recent Labs Lab 04/30/17 0315  05/03/17 0300 05/04/17 0355 04/29/2017 0332 05/02/2017 2104 05/02/2017 0402  NA 129*  < > 133* 134* 134* 136 136  K 3.8  < > 4.2  3.9 3.8 4.0 4.0  CL 99*  < > 104 107 107 108 108  CO2 21*  < > 22 21* 21* 20* 20*  GLUCOSE 144*  < > 201* 218* 176* 217* 229*  BUN 88*  < > 83* 78* 80* 78* 81*  CREATININE 3.00*  < > 2.02* 1.92* 1.88* 1.87* 1.94*  CALCIUM 7.8*  < > 7.6* 7.4* 7.3* 7.5* 7.5*  MG 1.9  --  1.9  --   --   --  1.8  < > = values in this interval not displayed.  Liver Function Tests:  Recent Labs Lab 05/04/17 0355  AST 21  ALT 11*  ALKPHOS 56  BILITOT 0.7  PROT 4.2*  ALBUMIN 1.7*   No results for input(s): LIPASE, AMYLASE in the last 168 hours. No results for input(s): AMMONIA in the last 168 hours.  CBC:  Recent Labs Lab 05/02/17 1535  05/04/17 0355 04/30/2017 0332 05/01/2017 1414 05/13/2017 2104 05/06/17 0402  WBC 8.9  < > 12.2* 12.3* 13.9* 17.4* 17.5*  NEUTROABS 7.2  --   --  10.5*  --   --   --   HGB 7.3*  < > 7.9* 6.5* 7.4* 8.5* 7.5*  HCT 21.7*  < > 23.1* 19.1* 21.9*  24.9* 22.4*  MCV 87.1  < > 86.8 88.8 87.6 86.5 86.8  PLT 213  < > 158 160 162 166 178  < > = values in this interval not displayed.  INR:  Recent Labs Lab 05/03/17 0300 05/04/17 0355 05/18/2017 0332 2016/11/15 0402 2016/11/15 1130  INR 3.58 3.94 3.22 2.20 2.17   LDH: 184  Other results:  EKG:   Imaging:  No results found.   Medications:     Scheduled Medications: . amiodarone  400 mg Oral BID  . Chlorhexidine Gluconate Cloth  6 each Topical Daily  . febuxostat  40 mg Oral Daily  . feeding supplement  1 Container Oral BID BM  . feeding supplement (ENSURE ENLIVE)  237 mL Oral Q24H  . fentaNYL (SUBLIMAZE) injection  50 mcg Intravenous Once  . hydrALAZINE  25 mg Oral Q8H  . insulin aspart  0-15 Units Subcutaneous TID WC  . isosorbide mononitrate  30 mg Oral Daily  . mexiletine  200 mg Oral Q8H  . pantoprazole (PROTONIX) IV  40 mg Intravenous Q12H  . sodium chloride flush  10-40 mL Intracatheter Q12H  . sodium chloride flush  3 mL Intravenous Q12H     Infusions: . sodium chloride Stopped (04/19/17 1200)   . sodium chloride Stopped (04/21/17 1500)  . sodium chloride Stopped (05/02/17 0630)  . sodium chloride 5 mL/hr at 2016/11/15 1300  . sodium chloride 75 mL/hr at 2016/11/15 1300  . amiodarone 60 mg/hr (2016/11/15 0752)  . lactated ringers Stopped (05/01/17 0810)  . milrinone Stopped (05/11/2017 2238)  . norepinephrine (LEVOPHED) Adult infusion    . phenylephrine (NEO-SYNEPHRINE) Adult infusion Stopped (05/02/17 2000)     PRN Medications:  albuterol, docusate, hydrALAZINE, neomycin-bacitracin-polymyxin, ondansetron (ZOFRAN) IV, oxyCODONE, phenol, sodium chloride flush, sodium chloride flush, traMADol  Assessment/Plan/Discussion:    POD 19s/p HVAD for ischemic cardiomyopathy with acute on chronic systolic heart failure, EF 15% with moderate RV dysfunction. Returned to OR for bleeding from raw mediastinal tissues. He has been hemodynamically stable overnight off milrinone with low CVP and in VT. VAD speed turned down this am for episode of sustained VT. Etiology may be low CVP with inflow cannula stimulating endocardium. Increase volume with PRBC's.  Stage 3 CKD: creat improved to 1.88 yesterday after ATN related to surgery, ileus, vomiting, hypotension requiring pressors. Today slightly higher. BUN stable in low 80's probably due to GI blood.  GI bleed: continues to have melena/maroon stool with drop in Hgb despite 3 unit transfusion yesterday.  GI medicine has seen him and plans endoscopy now that INR down to 2.2.  He has had chronic anemia preop that was felt to be due to chronic disease but may have been some low grade GI blood loss now worsened with anticoagulation since INR rose so high.  Acute respiratory failure due to aspiration pneumonia resolved.  Ileus resolved  INR down to 2.2  No coumadin for now until after GI workup complete.  Will need heparin when he gets down to 2.  DM: preop Hgb A1c 5.9. Glucose under good control. ContinueLevemir and SSI.  Gout: observe  Hx  of arrhythmias preop on amio and now sustained VT possibly due to suction event. Continue amiodarone, expand volume. EP following. ICD in place but will wait to turn on therapies.   Severe malnutrition with prealbumin < 5: Encourage PO intake.   I reviewed the LVAD parameters from today, and compared the results to the patient's prior recorded data. LVAD equipment check completed andis in  good working order. Back-up equipment present.   Length of Stay: [id number]  Alleen Borne 05/05/2017, 2:28 PM

## 2017-05-06 NOTE — Progress Notes (Signed)
eLink Physician-Brief Progress Note Patient Name: James FabianHarry D Baston DOB: Mar 13, 1961 MRN: [id number]   Date of Service  07/01/2017  HPI/Events of Note  CXR shows RT mainstem intubation  eICU Interventions  Nurse instructed to all RT to pull back 2 cm's        Enisa Runyan 07/01/2017, 6:32 PM

## 2017-05-06 NOTE — Progress Notes (Signed)
OT Cancellation Note  Patient Details Name: James [name] MRN: [id number] DOB: 1961-10-20   Cancelled Treatment:    Reason Eval/Treat Not Completed: Medical issues which prohibited therapy.  Will try back.  Saya Mccoll [place]arpe, OTR/L [phone]   [name], Eleanna Theilen M 04/22/2017, 11:16 AM

## 2017-05-06 NOTE — H&P (View-Only) (Signed)
Daily Rounding Note  04/23/2017, 12:19 PM  LOS: 24 days   SUBJECTIVE:   Chief complaint:  Maroon stools/melena continues.  He is eating pretty well and taking Ensure Plus whole milk as supplements. He is making an effort to eat more than he feels like eating in order to gain strength. No abdominal pain or nausea. Feels a bit tired.     OBJECTIVE:         Vital signs in last 24 hours:    Temp:  [97.3 F (36.3 C)-98.7 F (37.1 C)] 97.7 F (36.5 C) (07/16 1130) Pulse Rate:  [63-157] 150 (07/16 1200) Resp:  [19-31] 24 (07/16 1200) BP: (81-108)/(63-82) 85/67 (07/16 1200) SpO2:  [98 %-100 %] 100 % (07/16 1200) Weight:  [101.6 kg (223 lb 15.8 oz)] 101.6 kg (223 lb 15.8 oz) (07/16 0500) Last BM Date: 05/17/2017 Filed Weights   05/03/17 0600 05/04/17 0500 04/20/2017 0500  Weight: 101.7 kg (224 lb 3.2 oz) 102.3 kg (225 lb 8.5 oz) (S) 101.6 kg (223 lb 15.8 oz)   General: Looks the same, somewhat chronically ill looking alert, comfortable.   Heart: Regular rhythm beneath the bad home. Chest: Clear bilaterally. A little bit of phlegm in his throat as he speaks. Abdomen: Soft. Not tender or distended. Bowel sounds active.  Extremities: Some edema in the proximal to the [place] boots wrapped around his legs. Severe gouty changes in the digits of the fingers. Neuro/Psych:  Pleasant. Affect bland but not obviously depressed. Cooperative, engaged.  Intake/Output from previous day: 07/15 0701 - 07/16 0700 In: 870.3 [P.O.:240; I.V.:206.6; Blood:423.8] Out: 1450 [Urine:1450]  Intake/Output this shift: Total I/O In: 657.6 [I.V.:17.6; Blood:640] Out: 1225 [Urine:1225]  Lab Results:  Recent Labs  05/03/17 2151 05/04/17 0355 05/07/2017 0332  WBC 13.2* 12.2* 12.3*  HGB 7.8* 7.9* 6.5*  HCT 22.9* 23.1* 19.1*  PLT 185 158 160   BMET  Recent Labs  05/03/17 0300 05/04/17 0355 05/08/2017 0332  NA 133* 134* 134*  K 4.2 3.9 3.8  CL 104  107 107  CO2 22 21* 21*  GLUCOSE 201* 218* 176*  BUN 83* 78* 80*  CREATININE 2.02* 1.92* 1.88*  CALCIUM 7.6* 7.4* 7.3*   LFT  Recent Labs  05/04/17 0355  PROT 4.2*  ALBUMIN 1.7*  AST 21  ALT 11*  ALKPHOS 56  BILITOT 0.7  BILIDIR 0.2  IBILI 0.5   PT/INR  Recent Labs  05/04/17 0355 04/30/2017 0332  LABPROT 39.6* 33.6*  INR 3.94 3.22   Hepatitis Panel No results for input(s): HEPBSAG, HCVAB, HEPAIGM, HEPBIGM in the last 72 hours.  Studies/Results: Dg Chest Port 1 View  Result Date: 05/04/2017 CLINICAL DATA:  Followup bibasilar atelectasis. EXAM: PORTABLE CHEST 1 VIEW COMPARISON:  05/02/2017. FINDINGS: Poor inspiration. Stable enlarged cardiac silhouette and post CABG changes. The left ventricular assist device is unchanged as is a left subclavian AICD lead and right PICC. Clear lungs. Right shoulder fixation anchors. IMPRESSION: No acute abnormality. Electronically Signed   By: Beckie Salts M.D.   On: 05/04/2017 07:39    ASSESMENT:   *  GI bleeding  On empiric BID, IV Protonix.    *  Blood loss anemia.  s/p 16 PRBCs, 2 of these yesterday but hgb dropped this AM so recieved 2 more today.      *  Coagulopathy.  INR not yet suitable for endoscopic evaluation. Last Coumadin given 7/12.  Plan to initiate a heparin drip when INR  less than 1.8  *  S/P LVAD 6/28.     PLAN   *  Enteroscopy once INR at or below 2.  Patient aware of the issues and why we are delaying enteroscopy.  *  I wonder if we could allow patient to have increased amount of leafy green vegetables such as mustard or turnip greens, spinach in and attempts to reverse the INR?    Jennye MoccasinSarah Imo [place]umbie  05/16/2017, 12:19 PM Pager: [phone]

## 2017-05-06 NOTE — Interval H&P Note (Signed)
History and Physical Interval Note:  05/09/2017 1:48 PM  James [name]  has presented today for surgery, with the diagnosis of upper GI bleed  The various methods of treatment have been discussed with the patient and family. After consideration of risks, benefits and other options for treatment, the patient has consented to  Procedure(s): ESOPHAGOGASTRODUODENOSCOPY (EGD) (N/A) as a surgical intervention .  The patient's history has been reviewed, patient examined, no change in status, stable for surgery.  I have reviewed the patient's chart and labs.  Questions were answered to the patient's satisfaction.     Reeves ForthSteven Paul Armbruster

## 2017-05-06 NOTE — Progress Notes (Signed)
GI UPDATE:  Patient scheduled for upper endoscopy today - he continues to bleed with significant blood product requirements over the past few days. CVP is low, tachycardia persists, he has had intermittent episodes of V-tach, managed per cardiology. His INR has downtrended to 2.1, I think it is okay to proceed with endoscopy from anticoagulation standpoint however I am concerned about his hemodynamics at this time. Arterial line being placed now for his procedure, case being done at the bedside with critical care attending coming to help with managing airway / sedation if needed. Regarding his tachycardia and arrythmias, spoke with cardiology / LVAD coordinator, they feel this is due to hypovolemia and okay with proceeding with his endoscopy at this time, hopefully if we can provide hemostasis this will improve his hemodynamics. The patient is getting another unit of blood ordered to be given now. I have discussed risks / benefits of endoscopy / anesthesia with the patient and his wife and they wanted to proceed. Further recommendations pending result.  Ileene PatrickSteven Armbruster, MD [facility] Pager [phone]

## 2017-05-06 NOTE — Progress Notes (Signed)
Dr. Donata ClayVan Trigt made aware of pt's inadequate UOP - no output since 1pm. Bladder scan was done and showed 400 cc. Verbal order to place foley and to administer NS @ 75 continuous infusion. Will implement and continue to monitor.

## 2017-05-06 NOTE — Progress Notes (Signed)
Chaplain stopped by to visit with patient.  Chaplain looking to care for family.  No family present at this time with patient.  Per previous conversation with patient, he would like regular visits.  Patient not available at this time for visitation.    05/09/2017 1045  Clinical Encounter Type  Visited With Health care provider  Visit Type Follow-up  Referral From Chaplain

## 2017-05-06 NOTE — Op Note (Addendum)
[facility] Patient Name: James [name] Procedure Date : 05/19/2017 MRN: [id number] Attending MD: Willaim Rayas. Lavonia Eager MD, MD Date of Birth: 07-17-1961 CSN: [id number] Age: 56 Admit Type: Inpatient Procedure:                Upper GI endoscopy Indications:              Suspected upper gastrointestinal bleeding, history                            of LVAD, on coumadin, INR 2.1, patient with                            significant blood transfusion requirement. Patient                            electively intubated during this case as below Providers:                Willaim Rayas. Jillian Warth MD, MD, Tomma Rakers, RN,                            Arlee Muslim Tech., Technician Referring MD:              Medicines:                Fentanyl 150 micrograms IV, Midazolam 10 mg IV Complications:            No immediate complications. Estimated blood loss:                            difficult to determine, active bleeding noted,                            several ccs'. Estimated Blood Loss:     Estimated blood loss: difficult to determine -                            active bleeding noted upon entering duodenum,                            several cc's Procedure:                Pre-Anesthesia Assessment:                           - Prior to the procedure, a History and Physical                            was performed, and patient medications and                            allergies were reviewed. The patient's tolerance of                            previous anesthesia was also reviewed. The risks  and benefits of the procedure and the sedation                            options and risks were discussed with the patient.                            All questions were answered, and informed consent                            was obtained. Prior Anticoagulants: The patient has                            taken Coumadin (warfarin), last dose was day of                procedure. ASA Grade Assessment: III - A patient                            with severe systemic disease. After reviewing the                            risks and benefits, the patient was deemed in                            satisfactory condition to undergo the procedure.                           After obtaining informed consent, the endoscope was                            passed under direct vision. Throughout the                            procedure, the patient's blood pressure, pulse, and                            oxygen saturations were monitored continuously. The                            EC-[id number] ([id number]) scope was introduced through                            the mouth, and advanced to the second part of                            duodenum. The upper GI endoscopy was technically                            difficult and complex due to excessive bleeding.                            The patient tolerated the procedure well. Findings:      Esophagogastric landmarks were identified: the Z-line was found at 40       cm, the gastroesophageal junction  was found at 40 cm and the upper       extent of the gastric folds was found at 40 cm from the incisors.      The exam of the esophagus was otherwise normal.      A large amount of food was found in the gastric body. 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 This was removed and a pediatric       colonoscope was placed to intubate the duodenum. The exam of the stomach       was otherwise normal, no blood in the stomach..      Red blood and clots was found in the entire duodenum with active       bleeding noted. With this finding and the retained food in the stomach,       the patient was electively intubated to protect the airway.      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 Area was successfully injected with of a       1:10,000 solution of epinephrine for hemostasis. This slowed the       bleeding but did not stop it. Fulguration to stop the bleeding by argon       plasma was successful to achieve hemostasis. To prevent bleeding       post-maneuver and mark the site in case rebleeding occurs, two       hemostatic clips were successfully placed across the site. There was no       bleeding at the end of the procedure. Impression:               - Esophagogastric landmarks identified.                           - Normal esophagus                           - A large amount of food in the stomach without                            evidence of gastric outlet obstruction.                           - J shaped stomach, pediatric colonoscope was used                            to complete this exam                           - Blood in the entire examined duodenum requiring                            extensive lavage.                           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 Injected with epinephrine, treated                            with argon plasma coagulation (APC). 2 hemostasis                            clips were placed. Moderate Sedation:      Moderate (conscious) sedation was administered by the endoscopy nurse       and supervised by the endoscopist. The following parameters were       monitored: oxygen saturation, heart rate, blood pressure, and response       to care. Total physician intraservice time was 73 minutes. Recommendation:           - continue ongoing care in the ICU.                           - NPO for now                           - continue present medications.                           - serial Hgb, transfuse as needed                           - please call  with any evidence of recurrent                            bleeding                           - GI service will continue to follow Procedure Code(s):        --- Professional ---                           [phone], Esophagogastroduodenoscopy, flexible,                            transoral; with control of bleeding, any method                           99152, Moderate sedation services provided by the                            same physician or other qualified health care                            professional performing the diagnostic or                            therapeutic service that the sedation supports,  requiring the presence of an independent trained                            observer to assist in the monitoring of the                            patient's level of consciousness and physiological                            status; initial 15 minutes of intraservice time,                            patient age 70 years or older                           [id number], Moderate sedation services; each additional                            15 minutes intraservice time                           99153, Moderate sedation services; each additional                            15 minutes intraservice time                           99153, Moderate sedation services; each additional                            15 minutes intraservice time                           99153, Moderate sedation services; each additional                            15 minutes intraservice time Diagnosis Code(s):        --- Professional ---                           K92.2, Gastrointestinal hemorrhage, unspecified                           K31.811, Angiodysplasia of stomach and duodenum                            with bleeding CPT copyright 2016 [employer]. All rights reserved. The codes documented in this report are preliminary and upon coder review may  be revised to meet current  compliance requirements. Viviann Spare P. Colleen Donahoe MD, MD 05/13/2017 6:39:59 PM This report has been signed electronically. Number of Addenda: 0

## 2017-05-06 NOTE — Procedures (Signed)
Arterial Catheter Insertion Procedure Note James [name] [id number] Jul 12, 1961  Procedure: Insertion of Arterial Catheter  Indications: Blood pressure monitoring  Procedure Details Consent: Risks of procedure as well as the alternatives and risks of each were explained to the (patient/caregiver).  Consent for procedure obtained. Time Out: Verified patient identification, verified procedure, site/side was marked, verified correct patient position, special equipment/implants available, medications/allergies/relevent history reviewed, required imaging and test results available.  Performed  Maximum sterile technique was used including antiseptics, cap, gloves, gown, hand hygiene, mask and sheet. Skin prep: Chlorhexidine; local anesthetic administered 20 gauge catheter was inserted into left radial artery using the Seldinger technique.  Evaluation Blood flow good; BP tracing good. Complications: No apparent complications.   Ancil BoozerSmallwood, Donnesha Karg 04/28/2017

## 2017-05-06 NOTE — Consult Note (Signed)
WOC Nurse wound consult note Reason for Consult: LE wound and ? Unna boots Bedside nurse reports that patient has RLE wound treated as an outpatient.  Unna's boots were placed while inpatient for edema.   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 Bedside nurse reports she has removed several during his stay Pressure Injury POA: NA Measurement: several scattered areas that are open with crystals present in some of the wounds Wound bed: ruddy, red Drainage (amount, consistency, odor) moderate, non purulent Periwound: edema It is noted that the LLE posterior calf has nodules, but the skin is not open Dressing procedure/placement/frequency:  continue foam dressing for drainage management, patient does not wish to have Unna's boots replaced.  Discussed POC with patient and bedside nurse.  Re consult if needed, will not follow at this time. Thanks  Loreta Blouch M.D.C[employer] MSN, RN,CWOCN, CNS, CWON-AP [phone])

## 2017-05-06 NOTE — Progress Notes (Signed)
PT Cancellation Note  Patient Details Name: James [name] MRN: [id number] DOB: [birth year]   Cancelled Treatment:    Reason Eval/Treat Not Completed: Medical issues which prohibited therapy  Hold today per RN. PT will continue to follow acutely.    Derek MoundKellyn R Jaciel Diem Denver Bentson, PTA Pager: [phone]   Feb 19, 2017, 10:26 AM

## 2017-05-06 NOTE — Procedures (Signed)
Intubation Procedure Note ATHA MCBAIN [id number] 1961-02-13  Procedure: Intubation Indications: Airway protection and to facilitate upper endoscopy  Procedure Details Consent: Risks of procedure as well as the alternatives and risks of each were explained to the (patient/caregiver).  Consent for procedure obtained. Time Out: Verified patient identification, verified procedure, site/side was marked, verified correct patient position, special equipment/implants available, medications/allergies/relevent history reviewed, required imaging and test results available.  Performed  Maximum sterile technique was used including gloves, gown, hand hygiene and mask.  Mac 4 Glidescope  Sedation: fentanyl and versed were given prior to this procedure as part of initial EGD. EGD stopped temporarily for ET intubation to protect airway. Etomidate 73m given in divided doses.   8.0 ETT placed under direct vis with Mac4 glidescope. Position confirmed by direct vis, ETCO2 and auscultation. secured  at 24cm at teeth. CXR pending. No complications.     Evaluation Hemodynamic Status: BP stable throughout; O2 sats: stable throughout Patient's Current Condition: stable Complications: No apparent complications Patient did tolerate procedure well. Chest X-ray ordered to verify placement.  CXR: pending.   RBaltazar Apo MD, PhD 05/14/2017, 3:57 PM [facility] [phone] if no answer [phone]

## 2017-05-07 ENCOUNTER — Encounter (HOSPITAL_COMMUNITY): Payer: Self-pay | Admitting: Gastroenterology

## 2017-05-07 ENCOUNTER — Inpatient Hospital Stay (HOSPITAL_COMMUNITY): Payer: Medicare PPO

## 2017-05-07 DIAGNOSIS — Z7901 Long term (current) use of anticoagulants: Secondary | ICD-10-CM

## 2017-05-07 DIAGNOSIS — K922 Gastrointestinal hemorrhage, unspecified: Secondary | ICD-10-CM

## 2017-05-07 LAB — COOXEMETRY PANEL
CARBOXYHEMOGLOBIN: 1.5 % (ref 0.5–1.5)
Carboxyhemoglobin: 1.1 % (ref 0.5–1.5)
METHEMOGLOBIN: 1.5 % (ref 0.0–1.5)
METHEMOGLOBIN: 0.8 % (ref 0.0–1.5)
O2 SAT: 77.6 %
O2 Saturation: 91.8 %
TOTAL HEMOGLOBIN: 9.3 g/dL — AB (ref 12.0–16.0)
TOTAL HEMOGLOBIN: 8.4 g/dL — AB (ref 12.0–16.0)

## 2017-05-07 LAB — BASIC METABOLIC PANEL
ANION GAP: 8 (ref 5–15)
BUN: 83 mg/dL — ABNORMAL HIGH (ref 6–20)
CALCIUM: 7.6 mg/dL — AB (ref 8.9–10.3)
CO2: 19 mmol/L — AB (ref 22–32)
Chloride: 107 mmol/L (ref 101–111)
Creatinine, Ser: 2.08 mg/dL — ABNORMAL HIGH (ref 0.61–1.24)
GFR, EST AFRICAN AMERICAN: 40 mL/min — AB (ref >60)
GFR, EST NON AFRICAN AMERICAN: 34 mL/min — AB (ref >60)
Glucose, Bld: 201 mg/dL — ABNORMAL HIGH (ref 65–99)
POTASSIUM: 4.1 mmol/L (ref 3.5–5.1)
Sodium: 134 mmol/L — ABNORMAL LOW (ref 135–145)

## 2017-05-07 LAB — MAGNESIUM: Magnesium: 2 mg/dL (ref 1.7–2.4)

## 2017-05-07 LAB — GLUCOSE, CAPILLARY
GLUCOSE-CAPILLARY: 171 mg/dL — AB (ref 65–99)
Glucose-Capillary: 179 mg/dL — ABNORMAL HIGH (ref 65–99)
Glucose-Capillary: 136 mg/dL — ABNORMAL HIGH (ref 65–99)
Glucose-Capillary: 138 mg/dL — ABNORMAL HIGH (ref 65–99)

## 2017-05-07 LAB — CBC
HEMATOCRIT: 26.7 % — AB (ref 39.0–52.0)
Hemoglobin: 9.3 g/dL — ABNORMAL LOW (ref 13.0–17.0)
MCH: 29.9 pg (ref 26.0–34.0)
MCHC: 34.8 g/dL (ref 30.0–36.0)
MCV: 85.9 fL (ref 78.0–100.0)
PLATELETS: 197 10*3/uL (ref 150–400)
RBC: 3.11 MIL/uL — AB (ref 4.22–5.81)
RDW: 19.1 % — AB (ref 11.5–15.5)
WBC: 16.8 10*3/uL — AB (ref 4.0–10.5)

## 2017-05-07 LAB — LACTATE DEHYDROGENASE: LDH: 195 U/L — AB (ref 98–192)

## 2017-05-07 LAB — PROTIME-INR
INR: 1.74
Prothrombin Time: 20.6 seconds — ABNORMAL HIGH (ref 11.4–15.2)

## 2017-05-07 MED ORDER — CHLORHEXIDINE GLUCONATE 0.12% ORAL RINSE (MEDLINE KIT)
15.0000 mL | Freq: Two times a day (BID) | OROMUCOSAL | Status: DC
  Administered 2017-05-07 – 2017-05-09 (×5): 15 mL via OROMUCOSAL

## 2017-05-07 MED ORDER — WARFARIN SODIUM 5 MG PO TABS
5.0000 mg | ORAL_TABLET | Freq: Once | ORAL | Status: AC
  Administered 2017-05-07: 5 mg via ORAL
  Filled 2017-05-07: qty 1

## 2017-05-07 MED ORDER — LIDOCAINE IN D5W 4-5 MG/ML-% IV SOLN
1.0000 mg/min | INTRAVENOUS | Status: DC
  Administered 2017-05-07 (×2): 2 mg/min via INTRAVENOUS
  Filled 2017-05-07 (×2): qty 500

## 2017-05-07 MED ORDER — METOCLOPRAMIDE HCL 10 MG PO TABS
10.0000 mg | ORAL_TABLET | Freq: Three times a day (TID) | ORAL | Status: DC
  Administered 2017-05-07: 10 mg via ORAL
  Filled 2017-05-07 (×2): qty 1

## 2017-05-07 MED ORDER — ATROPINE SULFATE 1 MG/10ML IJ SOSY
PREFILLED_SYRINGE | INTRAMUSCULAR | Status: AC
  Filled 2017-05-07: qty 10

## 2017-05-07 MED ORDER — ORAL CARE MOUTH RINSE
15.0000 mL | Freq: Four times a day (QID) | OROMUCOSAL | Status: DC
  Administered 2017-05-07 – 2017-05-09 (×6): 15 mL via OROMUCOSAL

## 2017-05-07 MED ORDER — WARFARIN - PHARMACIST DOSING INPATIENT
Freq: Every day | Status: DC
  Administered 2017-05-07 – 2017-05-12 (×4)
  Administered 2017-05-14 – 2017-05-17 (×4): 1
  Administered 2017-05-18 – 2017-05-28 (×7)

## 2017-05-07 NOTE — Progress Notes (Signed)
Patient ID: James [name], male   DOB: October 29, 1960, 56 y.o.   MRN: [id number]  Advanced Heart Failure VAD Team Note  Subjective:    HVAD placed 6/28.  Returned to the OR that evening with high chest tube output, evacuation of mediastinal hematoma.   Extubated 6/29. Milrinone stopped on 7/4.  Evening of 7/4, patient developed ileus with respiratory compromise. NGT placed with 3 L suctioned out.  CXR with suspicion for aspiration PNA.  Patient had to be intubated.  He went into atrial fibrillation with RVR.  He became hypotensive and was started on norepinephrine and phenylephrine.  Amiodarone gtt begun.     Extubated again on 7/6.   On 7/13 Had 2 sustained episodes of VT. Had to be cardioverted 1. Second episode broke with overdrive pacing with Dr. Caryl Comes. VAD speed turned down to 2700.   GI bleed --> 2 units PRBCs 7/14, 2 units 7/15. 3 U PRBCs 7/16,  04/24/2017 3 UPRBCs.   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  He had epinephrine, APC, and 2 clips.  No BRBPR overnight. Hgb 9.3. Intubated prior to procedure and placed on norepinephrine. Speed dropped to 2660.   Overnight he remain in VT. CVP 7-8 today (improved).  With overdrive pacing this morning, he went back into NSR in the 70s.  Lidocaine was started in addition to amiodarone.   Today, INR 1.74 with creatinine up to 2.08.  LDH stable at 195. Co-ox not accurate.   Awake on the vent following commands.   HVAD INTERROGATION:  HVAD:  Flow 4.1 liters/min, speed 2660, power 4 W,  Peak 5 Trough 3.2 Suction off. Lavare On.  After he was converted to NSR, peak/trough widened appropriately.   Objective:    Vital Signs:   Temp:  [97.5 F (36.4 C)-98.8 F (37.1 C)] 97.5 F (36.4 C) (07/18 0401) Pulse Rate:  [70-141] 141 (07/18 0700) Resp:  [12-37] 16 (07/18 0700) BP: (74-108)/(66-83) 88/83 (07/17 2000) SpO2:  [95 %-100 %] 100 % (07/18 0700) Arterial Line BP: (72-112)/(69-91) 88/84  (07/18 0700) FiO2 (%):  [40 %-100 %] 40 % (07/18 0405) Weight:  [230 lb (104.3 kg)] 230 lb (104.3 kg) (07/18 0500) Last BM Date: 04/30/2017 Mean arterial Pressure 80s   Intake/Output:   Intake/Output Summary (Last 24 hours) at 05/07/17 0734 Last data filed at 05/07/17 0700  Gross per 24 hour  Intake          3987.91 ml  Output              875 ml  Net          3112.91 ml     Physical Exam   CVP 7.  Physical Exam: GENERAL: Intubated. Awake. HEENT: normal ETT NECK: Supple, JVP  6-7. Caroltid 2+ bilaterally, no bruits.  No lymphadenopathy or thyromegaly appreciated.  CARDIAC:  Mechanical heart sounds with LVAD hum present.  LUNGS:  Clear to auscultation bilaterally.  ABDOMEN:  Soft, round, nontender, positive bowel sounds x4.     LVAD exit site:  Dressing dry and intact.  No erythema or drainage.  Stabilization device present and accurately applied.  Driveline dressing is being changed daily per sterile technique. EXTREMITIES:  RUE PICC L radial A line. R and LLE trace edema.  Neuro: Intubated but following commands.      Telemetry   VT most of the night 140s.Back in NSR 60s after ATP x5.   Labs   Basic Metabolic  Panel:  Recent Labs Lab 05/03/17 0300 05/04/17 0355 04/23/2017 0332 05/01/2017 2104 05/09/2017 0402 05/02/2017 1453 05/07/17 0448  NA 133* 134* 134* 136 136 137 134*  K 4.2 3.9 3.8 4.0 4.0 3.7 4.1  CL 104 107 107 108 108  --  107  CO2 22 21* 21* 20* 20*  --  19*  GLUCOSE 201* 218* 176* 217* 229* 231* 201*  BUN 83* 78* 80* 78* 81*  --  83*  CREATININE 2.02* 1.92* 1.88* 1.87* 1.94*  --  2.08*  CALCIUM 7.6* 7.4* 7.3* 7.5* 7.5*  --  7.6*  MG 1.9  --   --   --  1.8  --  2.0    Liver Function Tests:  Recent Labs Lab 05/04/17 0355  AST 21  ALT 11*  ALKPHOS 56  BILITOT 0.7  PROT 4.2*  ALBUMIN 1.7*   No results for input(s): LIPASE, AMYLASE in the last 168 hours. No results for input(s): AMMONIA in the last 168 hours.  CBC:  Recent Labs Lab  05/02/17 1535  04/23/2017 0332 05/04/2017 1414 05/19/2017 2104 05/10/2017 0402 04/28/2017 1453 05/07/17 0448  WBC 8.9  < > 12.3* 13.9* 17.4* 17.5*  --  16.8*  NEUTROABS 7.2  --  10.5*  --   --   --   --   --   HGB 7.3*  < > 6.5* 7.4* 8.5* 7.5* 8.2* 9.3*  HCT 21.7*  < > 19.1* 21.9* 24.9* 22.4* 24.0* 26.7*  MCV 87.1  < > 88.8 87.6 86.5 86.8  --  85.9  PLT 213  < > 160 162 166 178  --  197  < > = values in this interval not displayed.  INR:  Recent Labs Lab 05/04/17 0355 05/01/2017 0332 04/29/2017 0402 05/11/2017 1130 05/07/17 0448  INR 3.94 3.22 2.20 2.17 1.74    Other results:     Imaging   Dg Chest Port 1 View  Result Date: 04/26/2017 CLINICAL DATA:  Check endotracheal tube placement EXAM: PORTABLE CHEST 1 VIEW COMPARISON:  05/04/2017 FINDINGS: Cardiac shadow remains enlarged. Endotracheal tube is noted at the level of the carina directed towards right mainstem bronchus. This should be withdrawn and approximately 2 cm. Right-sided PICC line is noted in the mid superior vena cava. Defibrillator lead and left ventricular assist device are again noted and stable. No enteric catheter is identified. Clinical correlation is recommended. IMPRESSION: Endotracheal tube at the carina directed towards right mainstem bronchus. Should be withdrawn 2 cm. Tubes and lines as described.  No anterior catheter is seen. These results will be called to the ordering clinician or representative by the Radiologist Assistant, and communication documented in the PACS or zVision Dashboard. Electronically Signed   By: Inez Catalina M.D.   On: 04/29/2017 18:19   Dg Abd Portable 1v  Result Date: 04/26/2017 CLINICAL DATA:  Status post enteric catheter placement EXAM: PORTABLE ABDOMEN - 1 VIEW COMPARISON:  Film from earlier in the same day FINDINGS: The catheter has been advanced and now lies coiled within the stomach. The remainder of the study is stable. IMPRESSION: Enteric catheter within the stomach. Electronically  Signed   By: Inez Catalina M.D.   On: 04/21/2017 20:28   Dg Abd Portable 1v  Result Date: 04/28/2017 CLINICAL DATA:  Status post enteric catheter placement EXAM: PORTABLE ABDOMEN - 1 VIEW COMPARISON:  Film from earlier in the same day FINDINGS: A new enteric catheter is noted with the catheter tip extending into the stomach. The proximal side  port however lies in the distal esophagus. Remainder the exam is stable. IMPRESSION: Enteric catheter as described. Electronically Signed   By: Inez Catalina M.D.   On: 04/23/2017 20:27   Dg Abd Portable 1v  Result Date: 05/10/2017 CLINICAL DATA:  Check nasogastric catheter placement EXAM: PORTABLE ABDOMEN - 1 VIEW COMPARISON:  04/27/2016 FINDINGS: Scattered large and small bowel gas is noted. Left ventricular assist device and a defibrillator wire are again seen. A central line is noted at the cavoatrial junction. No enteric catheter is seen. IMPRESSION: No enteric catheter is noted. Electronically Signed   By: Inez Catalina M.D.   On: 05/17/2017 20:27     Medications:     Scheduled Medications: . amiodarone  400 mg Oral BID  . chlorhexidine gluconate (MEDLINE KIT)  15 mL Mouth Rinse BID  . Chlorhexidine Gluconate Cloth  6 each Topical Daily  . febuxostat  40 mg Oral Daily  . feeding supplement  1 Container Oral BID BM  . feeding supplement (ENSURE ENLIVE)  237 mL Oral Q24H  . fentaNYL (SUBLIMAZE) injection  50 mcg Intravenous Once  . hydrALAZINE  25 mg Oral Q8H  . insulin aspart  0-15 Units Subcutaneous TID WC  . isosorbide mononitrate  30 mg Oral Daily  . mouth rinse  15 mL Mouth Rinse QID  . mexiletine  200 mg Oral Q8H  . pantoprazole (PROTONIX) IV  40 mg Intravenous Q12H  . sodium chloride flush  10-40 mL Intracatheter Q12H  . sodium chloride flush  3 mL Intravenous Q12H    Infusions: . sodium chloride Stopped (04/19/17 1200)  . sodium chloride Stopped (04/21/17 1500)  . sodium chloride Stopped (05/02/17 0630)  . sodium chloride 5 mL/hr at  05/07/17 0700  . sodium chloride 75 mL/hr at 05/07/17 0700  . amiodarone 60 mg/hr (05/07/17 0700)  . fentaNYL infusion INTRAVENOUS 200 mcg/hr (05/07/17 0700)  . lactated ringers Stopped (05/01/17 0810)  . milrinone Stopped (05/15/2017 2238)  . norepinephrine (LEVOPHED) Adult infusion 9 mcg/min (05/07/17 0700)  . phenylephrine (NEO-SYNEPHRINE) Adult infusion Stopped (05/02/17 2000)    PRN Medications: albuterol, docusate, fentaNYL, hydrALAZINE, midazolam, midazolam, neomycin-bacitracin-polymyxin, ondansetron (ZOFRAN) IV, oxyCODONE, phenol, sodium chloride flush, sodium chloride flush, traMADol   Patient Profile   56 yo with CAD s/p CABG, ischemic cardiomyopathy/chronic systolic CHF, tophaceous gout, and CKD stage 3 was admitted for diuresis and consideration for LVAD placement. S/p HVAD on 6/28  Assessment/Plan:    1. Acute/chronic systolic CHF s/p HVAD: Ischemic cardiomyopathy.  St Jude ICD.  Echo (6/18) with EF 15%, mildly dilated RV with moderately decreased systolic function.  s/p HVAD placement 6/28 and had to return to OR to evacuate mediastinal hematoma.  He had been weaned off pressors/milrinone, but developed ileus w/ likely aspiration event 7/4, re-intubated, developed afib/RVR requiring norepinephrine. Extubated 7/6. Has LE edema but CVP remains low.  Diuretics on hold due to recent suction events/VT and low CVP. VAD speed turned down to 2660 with VT. VAD parameters currently look good.  Milrinone stopped 7/17 early am with recurrent VT.  Yesterday norepi started with intubation/EGD. CVP 7.  He is now on low dose norepinephrine.  - Hopefully can wean off norepinephrine with sedation weaning.  - Off ASA. Discussed with GI ok to start coumadin.  INR 1.74.  - No dig/spironolactone/arb with elevated creatinine.    2. AKI on CKD stage 3: Suspect a component of peri-op ATN, worsened with ileus, vomiting, intubation/sedation.  Creatinine up to 2.08 today with VT. Watch  closely.  Keep Map >70  and <85 3. Symptomatic anemia due to acute UGI bleeding:  Received 3 more units PRBCs 7/16 and 3 units PRBCs on 05/04/2017.  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  Requiring 2 clips + epi + APC. Not bleeding now.  - Continue IV Protonix - Restart warfarin, aiming for INR 2-2.5 range.  Will leave off aspirin for now.  4. Ventricular tachycardia: Status post ventricular tachycardia 2 on 7/13. Possible suction event. VAD speed turned down to 2700.  Recurrent VT 7/17. EP called to bedside. Overdrive pacing successful for about 10 minutes but VT recurred.  Milrinone turned off, remained in VT overnight but hemodynamically stable.  Speed decreased to 2660.  This morning, we were able to pace him out of VT.  Amiodarone gtt continues and lidocaine added.  - Continue amiodarone gtt.  - Will have him on lidocaine gtt while intubated, eventually to mexiletine.   5. CAD: s/p CABG 2012: No s/s of ischemia. Off aspirin due to GI bleeding. 6. Gout: Severe tophaceous gout. No complaint of gouty pain today.  - Continue uloric. Holding colchicine with AKI.     7. Atrial fibrillation: Developed atrial fibrillation with RVR in setting of aspiration PNA on 7/4.  He has been in NSR.  - Continue IV amio.   8. Malnutrition: Now off TPN. He is intubated again, hopefully extubated and back to diet later today.  9. Aspiration PNA with acute hypoxemic respiratory failure on 7/4: He was extubated on 7/6. Tracheal aspirate with Klebsiella and citrobacter. Both sensitive to Zosyn.  - Completed abx 7/13  10. Ileus: Resolved 11. Deconditioning:  PT on hold.  12. Acute Respiratory Failure: Intubated 7/17 in setting of complex GI intervention. CCM managing. - Wean vent, hopefully extubate today.   I reviewed the HVAD parameters from today, and compared the results to the patient's prior recorded data.  No programming changes were made.  The HVAD is functioning within specified parameters.      Darrick Grinder, NP 05/07/2017, 7:34 AM  VAD Team --- VAD ISSUES ONLY--- Pager [phone] (7am - 7am) Advanced Heart Failure Team  Pager [phone] (M-F; 7a - 4p)  Please contact [facility] for night-coverage after hours (4p -7a ) and weekends on [email or web address]  Patient seen with NP, agree with the above note.  I performed exam and device interrogation.   Patient was in VT overnight, we gave blood and volume with increase in CVP to 7-8.  This morning, we successfully overdrive paced him back to NSR.   - Lidocaine gtt started in addition to amiodarone while intubated.  When extubated and on po meds will use mexiletine.   Yesterday, patient had treatment of actively bleeding dieulafoy lesion versus AVM.  No further overt GI bleeding overnight, hgb rose appropriately.   - Resume coumadin with goal INR 2-2.5.   - Continue IV Protonix for now.  - Octreotide infusions as outpatient.   Creatinine up to 2 with Lasix on hold.  Continue to hold Lasix today with CVP 7.    Intubated in setting of complex GI procedure.  CCM following, hopefully wean vent to extubation today.   He is on norepinephrine at low dose, aim to wean off today now that in NSR as sedation is weaned.   CRITICAL CARE Performed by: Loralie Champagne  Total critical care time: 45 minutes  Critical care time was exclusive of separately billable procedures and treating other patients.  Critical care  was necessary to treat or prevent imminent or life-threatening deterioration.  Critical care was time spent personally by me on the following activities: development of treatment plan with patient and/or surrogate as well as nursing, discussions with consultants, evaluation of patient's response to treatment, examination of patient, obtaining history from patient or surrogate, ordering and performing treatments and interventions, ordering and review of laboratory studies, ordering and review of radiographic studies, pulse oximetry and  re-evaluation of patient's condition.  Loralie Champagne 05/07/2017 8:30 AM

## 2017-05-07 NOTE — Progress Notes (Signed)
PT Cancellation Note  Patient Details Name: James [name] MRN: [id number] DOB: 25-Feb-1961   Cancelled Treatment:    Reason Eval/Treat Not Completed: Patient not medically ready Hold tx today per RN. PT will continue to follow acutely.    James [name], PTA Pager: [phone]   05/07/2017, 12:21 PM

## 2017-05-07 NOTE — Evaluation (Signed)
Clinical/Bedside Swallow Evaluation Patient Details  Name: James [name] MRN: [id number] Date of Birth: [birth year]  Today's Date: 05/07/2017 Time: SLP Start Time (ACUTE ONLY): 1600 SLP Stop Time (ACUTE ONLY): 1645 SLP Time Calculation (min) (ACUTE ONLY): 45 min  Past Medical History:  Past Medical History:  Diagnosis Date  . AICD (automatic cardioverter/defibrillator) present 09/2015   [facility] VR model [id number] (serial Number [id number]) ICD   . CHF (congestive heart failure) (HCC)   . Chronic edema   . CMI (chronic mesenteric ischemia) (HCC)   . Coronary artery disease   . Dyspnea   . Gout   . Gout   . Hypertension   . Ischemic cardiomyopathy    EF 25%  . Myocardial infarction [facility])    "they saw I'd had one; not sure when but it was before 2012"  . Obesity   . Renal insufficiency   . Type II diabetes mellitus (HCC)    Past Surgical History:  Past Surgical History:  Procedure Laterality Date  . CARDIAC CATHETERIZATION  11/2010  . CARDIAC CATHETERIZATION N/A 07/15/2016   Procedure: Right Heart Cath;  Surgeon: Laurey Morale, MD;  Location: [facility] INVASIVE CV LAB;  Service: Cardiovascular;  Laterality: N/A;  . CORONARY ARTERY BYPASS GRAFT  11/2010   Sequential left internal mammary graft to the mid and distal LAD, sequential sapenous vein graft to the first, second, and third obutuse marginal branches  of the  left circumflex  coronary artery.  Saphenous vein graft to the distal right coronary artery.  . ENTEROSCOPY N/A 05/01/2017   Procedure: ENTEROSCOPY;  Surgeon: Ruffin Frederick, MD;  Location: [facility] ENDOSCOPY;  Service: Gastroenterology;  Laterality: N/A;  . EP IMPLANTABLE DEVICE N/A 10/06/2015   Procedure: ICD Implant;  Surgeon: Will Jorja Loa, MD; [facility] VR model [id number] (serial Number [id number]) ICD implanted L chest   . HOT HEMOSTASIS N/A 05/03/2017   Procedure: HOT HEMOSTASIS (ARGON PLASMA COAGULATION/BICAP);  Surgeon: Ruffin Frederick, MD;  Location: [facility] ENDOSCOPY;  Service: Gastroenterology;  Laterality: N/A;  . INSERTION OF IMPLANTABLE LEFT VENTRICULAR ASSIST DEVICE N/A 04/16/2017   Procedure: INSERTION OF IMPLANTABLE LEFT VENTRICULAR ASSIST DEVICE;  Surgeon: Alleen Borne, MD;  Location: [facility] OR;  Service: Open Heart Surgery;  Laterality: N/A;  CIRC ARREST  NITRIC OXIDE  . RIGHT HEART CATH N/A 11/27/2016   Procedure: Right Heart Cath;  Surgeon: Laurey Morale, MD;  Location: [facility] INVASIVE CV LAB;  Service: Cardiovascular;  Laterality: N/A;  . RIGHT HEART CATH N/A 04/19/2017   Procedure: Right Heart Cath;  Surgeon: Laurey Morale, MD;  Location: [facility] INVASIVE CV LAB;  Service: Cardiovascular;  Laterality: N/A;  . RIGHT HEART CATH N/A 04/19/2017   Procedure: Right Heart Cath;  Surgeon: Laurey Morale, MD;  Location: [facility] INVASIVE CV LAB;  Service: Cardiovascular;  Laterality: N/A;  . SHOULDER OPEN ROTATOR CUFF REPAIR Right ~ 2000  . TEE WITHOUT CARDIOVERSION N/A 04/11/2017   Procedure: TRANSESOPHAGEAL ECHOCARDIOGRAM (TEE);  Surgeon: Alleen Borne, MD;  Location: [facility] OR;  Service: Open Heart Surgery;  Laterality: N/A;   HPI:  56 year old male admitted 03/23/2017 for LVAD placement. PMH significant for gout, DM2, HTN, CKD, CAD s/p CABGx6 (2012), systemic heart failure, St. Jude ICD. Pt has had 3 intubations this admission. Extubated this morning.   Assessment / Plan / Recommendation Clinical Impression  Pt completed oral care with suction after set up. Pt presents with adequate oral motor strength and  function, clear but low intensity voice quality, weak volitional cough. No overt s/s aspiration noted after trials of thin liquid, puree, or cracker, however, intubation 3x for a total of 10 days significantly increases risk of dysphagia. Recommend small bites of ice chips following oral care (q30 minutes) until objective study (FEES) can be completed next date. Pt/wife and RN aware of results and recommendations.    SLP Visit  Diagnosis: Dysphagia, pharyngeal phase (R13.13)    Aspiration Risk  Moderate aspiration risk    Diet Recommendation Ice chips PRN after oral care   Liquid Administration via: Spoon Medication Administration: Via alternative means Supervision: Patient able to self feed Compensations: Minimize environmental distractions;Slow rate;Small sips/bites Postural Changes: Seated upright at 90 degrees    Other  Recommendations Oral Care Recommendations: Oral care QID Other Recommendations: Have oral suction available   Follow up Recommendations  (TBD)      Frequency and Duration  pending MBS          Prognosis Prognosis for Safe Diet Advancement: Fair      Swallow Study   General Date of Onset: 04/13/2017 HPI: 56 year old male admitted 04/10/2017 for LVAD placement. PMH significant for gout, DM2, HTN, CKD, CAD s/p CABGx6 (2012), systemic heart failure, St. Jude ICD. Pt has had 3 intubations this admission. Extubated this morning. Type of Study: Bedside Swallow Evaluation Previous Swallow Assessment: none Diet Prior to this Study: NPO Temperature Spikes Noted: No Respiratory Status: Nasal cannula History of Recent Intubation: Yes Length of Intubations (days): 10 days (intubated x3) Date extubated: 05/07/17 Behavior/Cognition: Alert;Cooperative;Pleasant mood Oral Cavity Assessment: Within Functional Limits Oral Care Completed by SLP: Yes Oral Cavity - Dentition: Adequate natural dentition Vision: Functional for self-feeding Self-Feeding Abilities: Needs assist;Needs set up;Able to feed self Patient Positioning: Upright in bed Baseline Vocal Quality: Normal;Low vocal intensity Volitional Cough: Weak Volitional Swallow: Able to elicit    Oral/Motor/Sensory Function Overall Oral Motor/Sensory Function: Within functional limits   Ice Chips Ice chips: Within functional limits Presentation: Spoon   Thin Liquid Thin Liquid: Within functional limits Presentation: Straw;Cup    Nectar  Thick Nectar Thick Liquid: Not tested   Honey Thick Honey Thick Liquid: Not tested   Puree Puree: Within functional limits Presentation: Spoon   Solid   GO   Solid: Within functional limits Presentation: Self Fed       Sydell Prowell B. [place]edar HeightsBue[place], Hampton Regional Medical CenterMSP, CCC-SLP [phone]  Leigh AuroraBueche, Sandeep Radell Brown 05/07/2017,4:45 PM

## 2017-05-07 NOTE — Progress Notes (Signed)
ANTICOAGULATION CONSULT NOTE - Follow Up Consult  Pharmacy Consult for Warfarin  Indication: HVAD  No Active Allergies  Patient Measurements: Height: 6' (182.9 cm) Weight: 230 lb (104.3 kg) IBW/kg (Calculated) : 77.6  Vital Signs: Temp: 98.2 F (36.8 C) (07/18 1200) Temp Source: Oral (07/18 1200) BP: 84/73 (07/18 0842) Pulse Rate: 33 (07/18 1400)  Labs:  Recent Labs  04/22/2017 2104 05/19/2017 0402 05/19/2017 1130 04/20/2017 1453 05/07/17 0448  HGB 8.5* 7.5*  --  8.2* 9.3*  HCT 24.9* 22.4*  --  24.0* 26.7*  PLT 166 178  --   --  197  LABPROT  --  24.8* 24.5*  --  20.6*  INR  --  2.20 2.17  --  1.74  CREATININE 1.87* 1.94*  --   --  2.08*    Estimated Creatinine Clearance: 50.7 mL/min (A) (by C-G formula based on SCr of 2.08 mg/dL (H)).   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 He developed an ileus on 7/5, warfarin was held, and heparin bridge started once INR fell below 1.8.  Heparin stopped 7/10 with INR 2.19. On 7/13, Hgb dropped and he had melena - warfarin held again and pharmacy asked to start heparin once INR < 1.8 pending GI workup.   S/P EGD yesterday found to have actively bleeding dieulafoy lesion vs AVM, treated with 2 clips, epi, and APC.   INR down to 1.74 today, however, will not start heparin and just resume warfarin for now with a lower INR goal 2-2.5. Hgb stable 9.3 s/p multiple transfusions.  Goal of Therapy:  INR 2-2.5 Monitor platelets by anticoagulation protocol: Yes   Plan:  1) Resume warfarin 5mg  tonight 2) Daily INR  Louie CasaJennifer Shernell Saldierna, PharmD, BCPS 05/07/2017 2:42 PM

## 2017-05-07 NOTE — Progress Notes (Signed)
Edd FabianHarry D Gell has been in VT overnight. Multiple attempts at restoring sinus rhythm via pacing yesterday resulted in brief sinus rhythm with quick return to VT. Patient had AVM in small bowel cauterized yesterday and no further obvious bleeding. Went into VT 10 mintues after last ATP and has been in VT overnight. Paced out of VT today with ATP at 240 msec. Plan to continue IV amiodarone and start lidocaine.  Loman BrooklynWill Freddy Spadafora, MD

## 2017-05-07 NOTE — Progress Notes (Signed)
Patient ID: James FabianHarry D [name], male   DOB: Mar 02, 1961, 56 y.o.   MRN: [id number]  SICU Evening Rounds:  He is hemodynamically stable this evening with MAP 80's. Pump parameters stable. Rhythm is sinus 60. He had some bradycardia into the 30's this afternoon but none since. Backup pacer on 40.  CVP 5  Awake and alert No further BM's today Taking few ice chips.  Received 5 mg coumadin tonight.

## 2017-05-07 NOTE — Progress Notes (Signed)
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 Nursing states no further blood loss overnight, Hgb rising. He remains intubated. Having intermittent VT overnight.    Objective   Vital signs in last 24 hours: Temp:  [97.5 F (36.4 C)-98.8 F (37.1 C)] 97.5 F (36.4 C) (07/18 0401) Pulse Rate:  [70-141] 141 (07/18 0700) Resp:  [12-37] 16 (07/18 0700) BP: (74-108)/(66-83) 88/83 (07/17 2000) SpO2:  [95 %-100 %] 100 % (07/18 0700) Arterial Line BP: (72-112)/(69-91) 88/84 (07/18 0700) FiO2 (%):  [40 %-100 %] 40 % (07/18 0405) Weight:  [230 lb (104.3 kg)] 230 lb (104.3 kg) (07/18 0500) Last BM Date: 05/20/2017 General:   AA male in NAD, intubated Heart:  Tachycardic, LVAD Lungs: Respirations even and unlabored,  Abdomen:  Soft, nontender and nondistended.  Neurologic:  Alert and oriented,  grossly normal neurologically. Psych:  Cooperative. Normal mood and affect.  Intake/Output from previous day: 07/17 0701 - 07/18 0700 In: 3987.9 [I.V.:3612.9; Blood:365; NG/GT:10] Out: 875 [Urine:875] Intake/Output this shift: No intake/output data recorded.  Lab Results:  Recent Labs  05/06/2017 2104 2017-06-03 0402 06-03-17 1453 05/07/17 0448  WBC 17.4* 17.5*  --  16.8*  HGB 8.5* 7.5* 8.2* 9.3*  HCT 24.9* 22.4* 24.0* 26.7*  PLT 166 178  --  197   BMET  Recent Labs  05/19/2017 2104 06/03/2017 0402 June 03, 2017 1453 05/07/17 0448  NA 136 136 137 134*  K 4.0 4.0 3.7 4.1  CL 108 108  --  107  CO2 20* 20*  --  19*  GLUCOSE 217* 229* 231* 201*  BUN 78* 81*  --  83*  CREATININE 1.87* 1.94*  --  2.08*  CALCIUM 7.5* 7.5*  --  7.6*   LFT No results for input(s): PROT, ALBUMIN, AST, ALT, ALKPHOS, BILITOT, BILIDIR, IBILI in the last 72 hours. PT/INR  Recent Labs  06/03/17 1130 05/07/17 0448  LABPROT 24.5* 20.6*  INR 2.17 1.74    Studies/Results: Dg Chest Port 1 View  Result Date:  Jun 03, 2017 CLINICAL DATA:  Check endotracheal tube placement EXAM: PORTABLE CHEST 1 VIEW COMPARISON:  05/04/2017 FINDINGS: Cardiac shadow remains enlarged. Endotracheal tube is noted at the level of the carina directed towards right mainstem bronchus. This should be withdrawn and approximately 2 cm. Right-sided PICC line is noted in the mid superior vena cava. Defibrillator lead and left ventricular assist device are again noted and stable. No enteric catheter is identified. Clinical correlation is recommended. IMPRESSION: Endotracheal tube at the carina directed towards right mainstem bronchus. Should be withdrawn 2 cm. Tubes and lines as described.  No anterior catheter is seen. These results will be called to the ordering clinician or representative by the Radiologist Assistant, and communication documented in the PACS or zVision Dashboard. Electronically Signed   By: Alcide Clever M.D.   On: 06-03-2017 18:19   Dg Abd Portable 1v  Result Date: 2017/06/03 CLINICAL DATA:  Status post enteric catheter placement EXAM: PORTABLE ABDOMEN - 1 VIEW COMPARISON:  Film from earlier in the same day FINDINGS: The catheter has been advanced and now lies coiled within the stomach. The remainder of the study is stable. IMPRESSION: Enteric catheter within the stomach. Electronically Signed   By: Alcide Clever M.D.   On: 03-Jun-2017 20:28   Dg Abd Portable 1v  Result Date: 03-Jun-2017 CLINICAL DATA:  Status post enteric catheter placement EXAM: PORTABLE ABDOMEN - 1 VIEW COMPARISON:  Film from  earlier in the same day FINDINGS: A new enteric catheter is noted with the catheter tip extending into the stomach. The proximal side port however lies in the distal esophagus. Remainder the exam is stable. IMPRESSION: Enteric catheter as described. Electronically Signed   By: Alcide CleverMark  Lukens M.D.   On: 05/15/2017 20:27   Dg Abd Portable 1v  Result Date: 04/25/2017 CLINICAL DATA:  Check nasogastric catheter placement EXAM: PORTABLE  ABDOMEN - 1 VIEW COMPARISON:  04/27/2016 FINDINGS: Scattered large and small bowel gas is noted. Left ventricular assist device and a defibrillator wire are again seen. A central line is noted at the cavoatrial junction. No enteric catheter is seen. IMPRESSION: No enteric catheter is noted. Electronically Signed   By: Alcide CleverMark  Lukens M.D.   On: 05/01/2017 20:27       Assessment / Plan:   56 y/o male with history of CHF with LVAD in place on anticoagulation, post-op course complicated by GI bleeding. INR reached acceptable range for endoscopy yesterday. 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 He does not appear to have had further bloody bowel movements since the procedure. Hgb stable.   Recommend: - okay to resume coumadin today given his ongoing needs for anticoagulation, with monitoring for recurrence of bleeding. Would keep INR at lower end of acceptable range if possible - extubation per primary team - would continue IV PPI for now (helps with clotting) - NPO for now, consider clears tonight if no evidence of recurrent bleeding - trend Hgb. He will likely continue to pass some old blood per rectum given volume of blood noted in his bowel yesterday - of note, patient had retained food in the stomach on this exam without any clear evidence of gastric outlet obstruction. He had a post-op ileus, likely functional delay in transit. Please keep head of bed elevated once extubated to minimize risk of aspiration.   We will follow along. Call with questions.  Ileene PatrickSteven Armbruster, MD [facility] Pager [phone]

## 2017-05-07 NOTE — Progress Notes (Signed)
Chaplain stopped by to follow-up with family and patient.  Patient looks better today.  Wife at bedside caring and talking with patient.  Chaplain had prayer with them and assured them that I would check in and provide presence.   05/07/17 0954  Clinical Encounter Type  Visited With Patient;Family  Visit Type Follow-up;Psychological support;Spiritual support;Social support;Post-op;Critical Care  Referral From Physician  Spiritual Encounters  Spiritual Needs Prayer   Chaplain would like to thank the medical team for their constant care for this patient.

## 2017-05-07 NOTE — Progress Notes (Signed)
LVAD Inpatient Coordinator Rounding Note:  Admitted 04/18/2017 due to A/C Heart failure.   HeartWare LVAD implanted on 04/08/2017 by Dr. Laneta SimmersBartle as DT VAD.  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  He had epinephrine, APC, and 2 clips.    Vital signs: HR: 78 Doppler Pressure: 78 BP:84/73 O2 Sat: 100 on 4 LNC (extubated this AM per CCM) Wt:207>209 > 213 >213 > 208>203>220>227lbs>226>225>224>224>225>223>225>230   LVAD interrogation reveals:  Speed: 2660 Flow: 4.2 Power: 3.9 Alarms:none Peak: 5 Trough: 3.2 HCT: 27- adjusted personally today Low flow alarm setting: 2.5 High watt alarm setting: 6.5  Suction: Turned off  Lavare cycle: on  Blood Products: 6/28> 5 PRBC's, 6 FFP 7/1> 1 PRBC 7/9> 1 PRBC 7/13>3 PRBC 7/14>3 PRBC 7/15> 1 PRBC 7/16> 3 PRBC 7/17> 3 PRBC  Gtts: Milrinone restarted 7/8, stopped 7/16 Levo stopped 04/26/17 restarted Apr 03, 2017  Amiodarone stopped 04/29/17, restarted 7/17 continuous at 60 mg/hr Heparin stopped 04/29/17 Lidocaine 05/07/17  TPN - started 04/24/17 for nutritional support, stopped 04/30/17  Arrhythmia: 04/24/17 - Afib with RVR - started amiodarone, now PO 7/17- wide complex tachycardia- Amio bolus x3 and gtt at 60 mg/hr  Respiratory: 04/23/17 - re-intubated due to respiratory failure secondary to suspected aspiration pneumonia 04/25/17-extubated Apr 03, 2017- intubated for EGD Apr 03, 2017- extubated  Drive Line: Daily Dressing Kits with Aquacell AG silver strips per protocol.   Labs:  LDH trend:160 (pre VAD)>227>215>191>212>207>222>217>219>163>207>217>200>183>161>164>184>195  INR trend: 1.31>1.45>1.44>1.25>1.33>1.68>2.61>2.4>1.81>2.19>2.09>2.28>3.58>3.94>3.22>2.17>1.74  Anticoagulation Plan: -INR Goal: 2-2.5  -ASA Dose: 325 mg- on hold  Adverse Events on VAD: -Return to OR 6/28 with high chest tube output, evacuation of mediastinal hematoma -Ileus with vomiting on  04/23/17; re-intubation for acute respiratory failure; probable aspiration pneumonia -7/13-GI bleed   Plan/Recommendations:   1. Continue daily drive line dressing changes with patient wife. Plan to be assisted by VAD Coordinator today when notified by RN of availability. 2. Please call VAD pager with patient and equipment concerns.   Marcellus ScottLesley Wilson RN, VAD Coordinator 24/7 pager [phone]

## 2017-05-07 NOTE — Progress Notes (Signed)
CSW met at bedside with patient and wife. Patient had just been extubated and stated "had a difficult day yesterday". Patient and wife shared events of yesterday and hopeful to be on the "right track now". Wife reports she spent the night last night and is a bit tired today. Patient states he is tired and not getting sleep in the ICU. Both patient and wife understand the need for continued care due to critical needs but hoping for some rest tonight as patient begins to feel better. CSW provided supportive intervention and will continue to follow as needed throughout implant hospitalization. Raquel Sarna, [place], [place]

## 2017-05-07 NOTE — Procedures (Signed)
Extubation Procedure Note  Patient Details:   Name: James [name] DOB: May 27, 1961 MRN: [id number]   Airway Documentation:     Evaluation  O2 sats: stable throughout Complications: No apparent complications Patient did tolerate procedure well. Bilateral Breath Sounds: Clear, Diminished   Yes   Pt placed on [place] 4 L with humidity, no stridor noted, pt able to reach 1125 using incentive spirometer.  Forest BeckerJean S Takoya Jonas 05/07/2017, 9:25 AM

## 2017-05-07 NOTE — Progress Notes (Signed)
eLink Physician-Brief Progress Note Patient Name: James FabianHarry D [name] DOB: 1961/10/18 MRN: [id number]   Date of Service  05/07/2017  HPI/Events of Note  Speech Therapy concerned about ability to swallow and requests FEES study and diet order recommendation.   eICU Interventions  Will order FEES study.      Intervention Category Intermediate Interventions: Other:  Lenell AntuSommer,Steven Eugene 05/07/2017, 4:53 PM

## 2017-05-07 NOTE — Progress Notes (Signed)
Presented to patients room today for drive line exit wound care with his wife, Elease HashimotoMarcie. Existing VAD dressing removed and site care performed using sterile technique. Drive line exit site cleaned with Chlora prep applicators x 2, allowed to dry, and gauze dressing with silver strip re-applied. Exit site healed and incorporated, the velour is fully implanted at exit site. No redness, tenderness, drainage, foul odor or rash noted. Drive line anchor re-applied. Pt denies fever or chills.     Reinforced with [name] how to put on sterile gloves. As this seems to be her biggest challenge at this point. Dr Laneta SimmersBartle updated on drive line site appearance. Distal suture removed per order.   Marcellus ScottLesley Wilson RN, VAD Coordinator 24/7 pager [phone]

## 2017-05-07 NOTE — Progress Notes (Signed)
PULMONARY / CRITICAL CARE MEDICINE   Name: James [name] MRN: [id number] DOB: June 24, 1961    ADMISSION DATE:  03/23/2017 CONSULTATION DATE:  04/23/2017  REFERRING MD:  Laneta Simmers - CVTS  CHIEF COMPLAINT:  Acute respiratory failure  BRIEF SUMMARY:   [name] with CHF (EF 15%), AICD, CAD, Gout, HTN, CKD, DM, Anemia, admitted 04/04/2017 for VAD placement, as he had already been turned down by [facility] for transplant due to his gout and limitations with his mobility. Now s/p HVAD placement for his ischemic cardiomyopathy. His course has been complicated by AKI-on-CKD, and also complicated by pre-op Afib with RVR. He developed vomiting of feculent material and was found to have an ileus. An NG tube was placed to suction. However, patient then developed acute hypoxic respiratory failure requiring 15L O2 NRB, and he developed worsening respiratory distress, prompting ICU consult.  He was intubated for respiratory arrest, aspiration PNA > developed septic shock / required vasopressors.   Extubated 7/6.    SUBJECTIVE:  No events overnight, writing notes  VITAL SIGNS: BP (!) 84/73   Pulse (!) 56   Temp 98 F (36.7 C) (Axillary)   Resp 13   Ht 6' (1.829 m)   Wt 104.3 kg (230 lb)   SpO2 100%   BMI 31.19 kg/m   HEMODYNAMICS: CVP:  [3 mmHg-4 mmHg] 3 mmHg  VENTILATOR SETTINGS: Vent Mode: PSV;CPAP FiO2 (%):  [40 %-100 %] 40 % Set Rate:  [16 bmp] 16 bmp Vt Set:  [620 mL] 620 mL PEEP:  [5 cmH20] 5 cmH20 Pressure Support:  [5 cmH20] 5 cmH20 Plateau Pressure:  [16 cmH20-18 cmH20] 18 cmH20  INTAKE / OUTPUT: I/O last 3 completed shifts: In: 4921.9 [P.O.:240; I.V.:4206.9; Blood:415; NG/GT:10; IV Piggyback:50] Out: 1350 [Urine:1350]  PHYSICAL EXAMINATION: General: Chronically ill appearing male, NAD, writing notes on vent HEENT: [place]/AT, PERRL, EOM-I and MMM PSY: Unable to assess, intubated Neuro: Alert and interactive, writing notes, asking for ETT out. CV: RRR, Nl S1/S2, -M/R/G, hum appreciated. PULM: CTA  bilaterally GI: Soft, NT, ND and +BS Extremities: -edema and -tenderness. Skin: Intact  LABS:  BMET  Recent Labs Lab 05/10/2017 2104 05/14/2017 0402 05/20/2017 1453 05/07/17 0448  NA 136 136 137 134*  K 4.0 4.0 3.7 4.1  CL 108 108  --  107  CO2 20* 20*  --  19*  BUN 78* 81*  --  83*  CREATININE 1.87* 1.94*  --  2.08*  GLUCOSE 217* 229* 231* 201*   Electrolytes  Recent Labs Lab 05/03/17 0300  05/11/2017 2104 05/13/2017 0402 05/07/17 0448  CALCIUM 7.6*  < > 7.5* 7.5* 7.6*  MG 1.9  --   --  1.8 2.0  < > = values in this interval not displayed. CBC  Recent Labs Lab 05/09/2017 2104 05/17/2017 0402 05/01/2017 1453 05/07/17 0448  WBC 17.4* 17.5*  --  16.8*  HGB 8.5* 7.5* 8.2* 9.3*  HCT 24.9* 22.4* 24.0* 26.7*  PLT 166 178  --  197   Coag's  Recent Labs Lab 05/01/2017 0402 04/20/2017 1130 05/07/17 0448  INR 2.20 2.17 1.74   Sepsis Markers No results for input(s): LATICACIDVEN, PROCALCITON, O2SATVEN in the last 168 hours. ABG No results for input(s): PHART, PCO2ART, PO2ART in the last 168 hours. Liver Enzymes  Recent Labs Lab 05/04/17 0355  AST 21  ALT 11*  ALKPHOS 56  BILITOT 0.7  ALBUMIN 1.7*    Cardiac Enzymes No results for input(s): TROPONINI, PROBNP in the last 168 hours.  Glucose  Recent  Labs Lab 04/25/2017 0800 04/27/2017 1146 04/30/2017 1223 04/27/2017 1752 05/15/2017 2207 05/07/17 0832  GLUCAP 213* 153* 165* 217* 176* 179*    Imaging Dg Chest Port 1 View  Result Date: 05/07/2017 CLINICAL DATA:  LVAD placement EXAM: PORTABLE CHEST 1 VIEW COMPARISON:  04/25/2017 FINDINGS: Endotracheal tube has been retracted. The tip is 10 mm from the carina. NG tube has been placed with its tip in the fundus of the stomach. The stomach is decompressed. Right upper extremity PICC is stable. The heart remains mildly enlarged. LVAD device is stable at the cardiac apex. Left subclavian AICD device and lead are stable. There is persistent consolidation at the left lung base.  Lungs are under aerated with minimal atelectasis at the right lung base. There is a linear interface at the left apex and a 5% left apical pneumothorax is suspected. IMPRESSION: Endotracheal tube as been retracted and the tip is now 10 mm from the carina. 5% left apical pneumothorax is suspected. NG tube placed into the stomach. Stable left basilar consolidation and minimal atelectasis at the right base. Electronically Signed   By: Jolaine ClickArthur  Hoss M.D.   On: 05/07/2017 08:01   Dg Chest Port 1 View  Result Date: 05/15/2017 CLINICAL DATA:  Check endotracheal tube placement EXAM: PORTABLE CHEST 1 VIEW COMPARISON:  05/04/2017 FINDINGS: Cardiac shadow remains enlarged. Endotracheal tube is noted at the level of the carina directed towards right mainstem bronchus. This should be withdrawn and approximately 2 cm. Right-sided PICC line is noted in the mid superior vena cava. Defibrillator lead and left ventricular assist device are again noted and stable. No enteric catheter is identified. Clinical correlation is recommended. IMPRESSION: Endotracheal tube at the carina directed towards right mainstem bronchus. Should be withdrawn 2 cm. Tubes and lines as described.  No anterior catheter is seen. These results will be called to the ordering clinician or representative by the Radiologist Assistant, and communication documented in the PACS or zVision Dashboard. Electronically Signed   By: Alcide CleverMark  Lukens M.D.   On: 05/17/2017 18:19   Dg Abd Portable 1v  Result Date: 05/13/2017 CLINICAL DATA:  Status post enteric catheter placement EXAM: PORTABLE ABDOMEN - 1 VIEW COMPARISON:  Film from earlier in the same day FINDINGS: The catheter has been advanced and now lies coiled within the stomach. The remainder of the study is stable. IMPRESSION: Enteric catheter within the stomach. Electronically Signed   By: Alcide CleverMark  Lukens M.D.   On: 05/18/2017 20:28   Dg Abd Portable 1v  Result Date: 04/22/2017 CLINICAL DATA:  Status post enteric  catheter placement EXAM: PORTABLE ABDOMEN - 1 VIEW COMPARISON:  Film from earlier in the same day FINDINGS: A new enteric catheter is noted with the catheter tip extending into the stomach. The proximal side port however lies in the distal esophagus. Remainder the exam is stable. IMPRESSION: Enteric catheter as described. Electronically Signed   By: Alcide CleverMark  Lukens M.D.   On: 05/12/2017 20:27   Dg Abd Portable 1v  Result Date: 05/04/2017 CLINICAL DATA:  Check nasogastric catheter placement EXAM: PORTABLE ABDOMEN - 1 VIEW COMPARISON:  04/27/2016 FINDINGS: Scattered large and small bowel gas is noted. Left ventricular assist device and a defibrillator wire are again seen. A central line is noted at the cavoatrial junction. No enteric catheter is seen. IMPRESSION: No enteric catheter is noted. Electronically Signed   By: Alcide CleverMark  Lukens M.D.   On: 05/15/2017 20:27     STUDIES:  None  CULTURES: Blood 7/5 >> neg Sputum  7/5 >>klebsieall, citrobacter Sputum 7/7 >> klebsiella - resistant to amp otherwise sens  ANTIBIOTICS: Vancomycin 7/5 >> 7/7 Zosyn 7/5 >> 7/12  SIGNIFICANT EVENTS: 7/02  PRBC 7/05  Respiratory arrest post feculent vomitus episode 7/06  Extubated   LINES/TUBES: ETT 7/5 >> 7/6 Double lumen PICC on the right 7/2 >>  ASSESSMENT / PLAN: [name] with CHF (EF 15%), AICD, CAD, Gout, HTN, CKD, DM, Anemia, admitted 03/30/2017 for VAD placement, now POD 7 s/p HVAD placement, with course complicated by AKI-on-CKD and pre-op Afib with RVR. Overnight 7/5 he developed feculent vomiting related to an ileus with subsequent acute hypoxic respiratory failure with severe respiratory distress due to aspiration pneumonia, requiring intubation, & associated development of septic shock.  Extubated 7/6. PCCM signed off. Course complicated by refractory VT and ABLA due to GIB. Scoped 7/17 once INR down, large amount of blood noted and patient intubated to facilitate procedure.   PULMONARY A: Inability to  protect airway in peri-operative setting Aspiration PNA (resolved) P: SBT to extubate now Titrate O2 for sat of 88-92% CXR to PRN D/C further ABGs  CARDIOVASCULAR A: Acute on Chronic Systolic CHF / ICM - [employer] 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  PCCM will sign off, please call back if needed.  The patient is critically ill with multiple organ systems failure and requires high complexity decision making for assessment and support, frequent evaluation and titration of therapies, application of advanced monitoring technologies and extensive interpretation of multiple databases.   Critical Care Time devoted to patient care services described in this note is  35  Minutes. This time reflects time of care of this signee Dr Koren Bound. This critical care time does not reflect procedure time, or teaching time or supervisory time of PA/NP/Med student/Med Resident etc but could involve care discussion time.  Alyson Reedy, M.D. [facility] Pulmonary/Critical Care Medicine. Pager: [phone]. After hours pager: [phone].  05/07/2017, 9:40 AM

## 2017-05-08 ENCOUNTER — Inpatient Hospital Stay (HOSPITAL_COMMUNITY): Payer: Medicare PPO

## 2017-05-08 DIAGNOSIS — R488 Other symbolic dysfunctions: Secondary | ICD-10-CM

## 2017-05-08 DIAGNOSIS — G2569 Other tics of organic origin: Secondary | ICD-10-CM

## 2017-05-08 LAB — BASIC METABOLIC PANEL
Anion gap: 8 (ref 5–15)
BUN: 79 mg/dL — AB (ref 6–20)
CHLORIDE: 109 mmol/L (ref 101–111)
CO2: 18 mmol/L — AB (ref 22–32)
Calcium: 7.8 mg/dL — ABNORMAL LOW (ref 8.9–10.3)
Creatinine, Ser: 1.95 mg/dL — ABNORMAL HIGH (ref 0.61–1.24)
GFR calc Af Amer: 43 mL/min — ABNORMAL LOW (ref >60)
GFR calc non Af Amer: 37 mL/min — ABNORMAL LOW (ref >60)
GLUCOSE: 164 mg/dL — AB (ref 65–99)
POTASSIUM: 4.2 mmol/L (ref 3.5–5.1)
Sodium: 135 mmol/L (ref 135–145)

## 2017-05-08 LAB — PROTIME-INR
INR: 2.24
PROTHROMBIN TIME: 25.2 s — AB (ref 11.4–15.2)

## 2017-05-08 LAB — BRAIN NATRIURETIC PEPTIDE: B Natriuretic Peptide: 670.1 pg/mL — ABNORMAL HIGH (ref 0.0–100.0)

## 2017-05-08 LAB — CBC
HEMATOCRIT: 24.7 % — AB (ref 39.0–52.0)
HEMOGLOBIN: 8.2 g/dL — AB (ref 13.0–17.0)
MCH: 29.2 pg (ref 26.0–34.0)
MCHC: 33.2 g/dL (ref 30.0–36.0)
MCV: 87.9 fL (ref 78.0–100.0)
Platelets: 185 10*3/uL (ref 150–400)
RBC: 2.81 MIL/uL — AB (ref 4.22–5.81)
RDW: 18.9 % — ABNORMAL HIGH (ref 11.5–15.5)
WBC: 13.2 10*3/uL — ABNORMAL HIGH (ref 4.0–10.5)

## 2017-05-08 LAB — GLUCOSE, CAPILLARY
GLUCOSE-CAPILLARY: 160 mg/dL — AB (ref 65–99)
GLUCOSE-CAPILLARY: 173 mg/dL — AB (ref 65–99)
Glucose-Capillary: 144 mg/dL — ABNORMAL HIGH (ref 65–99)
Glucose-Capillary: 168 mg/dL — ABNORMAL HIGH (ref 65–99)

## 2017-05-08 LAB — COOXEMETRY PANEL
Carboxyhemoglobin: 1 % (ref 0.5–1.5)
METHEMOGLOBIN: 1.4 % (ref 0.0–1.5)
O2 Saturation: 69.6 %
Total hemoglobin: 7.9 g/dL — ABNORMAL LOW (ref 12.0–16.0)

## 2017-05-08 LAB — PHOSPHORUS: Phosphorus: 4.7 mg/dL — ABNORMAL HIGH (ref 2.5–4.6)

## 2017-05-08 LAB — MAGNESIUM: MAGNESIUM: 2 mg/dL (ref 1.7–2.4)

## 2017-05-08 LAB — LACTATE DEHYDROGENASE: LDH: 190 U/L (ref 98–192)

## 2017-05-08 MED ORDER — HYDRALAZINE HCL 25 MG PO TABS
25.0000 mg | ORAL_TABLET | Freq: Three times a day (TID) | ORAL | Status: DC
  Administered 2017-05-09 – 2017-05-12 (×9): 25 mg via ORAL
  Filled 2017-05-08 (×10): qty 1

## 2017-05-08 MED ORDER — MEXILETINE HCL 200 MG PO CAPS
200.0000 mg | ORAL_CAPSULE | Freq: Three times a day (TID) | ORAL | Status: DC
  Administered 2017-05-09 – 2017-05-14 (×16): 200 mg via ORAL
  Filled 2017-05-08 (×16): qty 1

## 2017-05-08 MED ORDER — MEXILETINE HCL 200 MG PO CAPS
200.0000 mg | ORAL_CAPSULE | Freq: Three times a day (TID) | ORAL | Status: DC
  Filled 2017-05-08: qty 1

## 2017-05-08 MED ORDER — WARFARIN SODIUM 2 MG PO TABS
1.0000 mg | ORAL_TABLET | Freq: Once | ORAL | Status: AC
  Administered 2017-05-08: 1 mg via ORAL
  Filled 2017-05-08 (×2): qty 0.5

## 2017-05-08 MED ORDER — METOCLOPRAMIDE HCL 5 MG/ML IJ SOLN
5.0000 mg | Freq: Three times a day (TID) | INTRAMUSCULAR | Status: DC
  Administered 2017-05-08 – 2017-05-14 (×18): 5 mg via INTRAVENOUS
  Filled 2017-05-08 (×19): qty 2

## 2017-05-08 NOTE — Progress Notes (Signed)
Occupational Therapy Progress Note  Assisted RN with transfer back to bed.  Pt requires total A +2 using maxi sky.  Pt with worsening cognition.  He demonstrates focused attention, echolalia noted, and pt frequently cursing.  He is only intermittently following commands, and when he does, he requires max multi modal cues.  Will continue to follow.     05/08/17 1500  OT Visit Information  Last OT Received On 05/08/17  Assistance Needed +2  History of Present Illness Pt adm with acute on chronic heart failure and underwent Heartware HVAD implant on 6/28. Returned to OR later that day for evacuation of mediastinal hematoma. On 7/4 developed ileus with likely aspiration and intubated 7/4-7/6.   Pt developed Heme + stools with progressive anemia as well as  Kirby FunkVtach 05/02/17.  He underwent EGD with showed suspected AVM 05/03/2017. He was intubated for procedure and extubated 05/08/17 PMH - gout, DMII, HTN, CKD, CAD S/P CABGx6 2012, chronic systolic heart failure and [employer] jude ICD.   Precautions  Precautions Sternal  Precaution Comments Heartware HVAD  Pain Assessment  Pain Assessment Faces  Faces Pain Scale 0  Cognition  Arousal/Alertness Awake/alert  Behavior During Therapy Flat affect  Overall Cognitive Status Impaired/Different from baseline  Area of Impairment Attention;Following commands  Current Attention Level Focused  Following Commands Follows one step commands inconsistently;Follows one step commands with increased time  General Comments Pt with significant echolalia, and repeatedly cursing.  Requires max multi modal cues to follow simple commands   Restrictions  Other Position/Activity Restrictions Sternal precautions  Transfers  General transfer comment Assisted RN with transfer back to bed using maxi sky.  He required total A +2  OT - End of Session  Equipment Utilized During Treatment Oxygen  Activity Tolerance Other (comment) (confusion/cognitive deficits )  Patient left in  bed;with call bell/phone within reach;with nursing/sitter in room;with family/visitor present  Nurse Communication Mobility status  OT Assessment/Plan  OT Plan Discharge plan remains appropriate  OT Visit Diagnosis Muscle weakness (generalized) (M62.81)  OT Frequency (ACUTE ONLY) Min 3X/week  Recommendations for Other Services Rehab consult  Follow Up Recommendations CIR;Supervision/Assistance - 24 hour  OT Equipment 3 in 1 bedside commode  AM-PAC OT "6 Clicks" Daily Activity Outcome Measure  Help from another person eating meals? 2  Help from another person taking care of personal grooming? 1  Help from another person toileting, which includes using toliet, bedpan, or urinal? 1  Help from another person bathing (including washing, rinsing, drying)? 1  Help from another person to put on and taking off regular upper body clothing? 1  Help from another person to put on and taking off regular lower body clothing? 1  6 Click Score 7  ADL G Code Conversion CM  OT Goal Progression  Progress towards OT goals Not progressing toward goals - comment  OT Time Calculation  OT Start Time (ACUTE ONLY) 1320  OT Stop Time (ACUTE ONLY) 1333  OT Time Calculation (min) 13 min  OT General Charges  $OT Visit 1 Procedure  OT Treatments  $Therapeutic Activity 8-22 mins  [employer]yn[employer] AmericanWendi Valerye Kobus, OTR/L [phone]

## 2017-05-08 NOTE — Care Management Important Message (Signed)
Important Message  Patient Details  Name: James [name] Schriefer MRN: [id number] Date of Birth: [birth year]   Medicare Important Message Given:  Yes    [name], Devyn Griffing Clinton, RN 05/08/2017, 3:23 PMImportant Message  Patient Details  Name: James [name] Lesinski MRN: [id number] Date of Birth: [birth year]   Medicare Important Message Given:  Yes    [name], Daxon Kyne Clinton, RN 05/08/2017, 3:23 PM

## 2017-05-08 NOTE — Progress Notes (Signed)
LVAD Inpatient Coordinator Rounding Note:  Admitted 04/08/2017 due to A/C Heart failure.   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 Laneta SimmersBartle as DT VAD.  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  He had epinephrine, APC, and 2 clips.    Vital signs: HR: 65 Arterial line: 109/74 (84) O2 Sat: 100% on 4L/[place] Wt:207>209 > 213 >213 > 208>203>220>227lbs>226>225>224>224>225>223>225>230>234   LVAD interrogation reveals:  Speed: 2660 Flow: 4.5 Power: 3.9 Alarms:none Peak: 7 Trough: 2.3 HCT: 25 Low flow alarm setting: 2.5 High watt alarm setting: 6.5  Suction: on  Lavare cycle: on  Blood Products: 6/28> 5 PRBC's, 6 FFP 7/1> 1 PRBC 7/9> 1 PRBC 7/13>3 PRBC 7/14>3 PRBC 7/15> 1 PRBC 7/16> 3 PRBC 7/17> 3 PRBC  Gtts: Milrinone restarted 7/8, stopped 7/16 Levo stopped 04/26/17 restarted 04/29/2017 - stopped 05/07/17 Amiodarone stopped 04/29/17, restarted 7/17 continuous at 30 mg/hr Heparin stopped 04/29/17 Lidocaine 05/07/17 - down to 1 mg/min  TPN - started 04/24/17 for nutritional support, stopped 04/30/17  Arrhythmia: 04/24/17 - Afib with RVR - started amiodarone, now PO 7/17- wide complex tachycardia- Amio bolus x3 and gtt at 60 mg/hr  Respiratory: 04/23/17 - re-intubated due to respiratory failure secondary to suspected aspiration pneumonia 04/25/17-extubated 05/10/2017- intubated for EGD 05/17/2017- extubated  Drive Line: Daily Dressing Kits with Aquacell AG silver strips per protocol.   Labs:  LDH trend:160 (pre VAD)>227>215>191>212>207>222>217>219>163>207>217>200>183>161>164>184>195>190  INR trend: 1.31>1.45>1.44>1.25>1.33>1.68>2.61>2.4>1.81>2.19>2.09>2.28>3.58>3.94>3.22>2.17>1.74>1.74  Anticoagulation Plan: -INR Goal: 2-2.5  -ASA Dose: 325 mg- on hold  Adverse Events on VAD: -Return to OR 6/28 with high chest tube output, evacuation of mediastinal hematoma -Ileus with vomiting on 04/23/17; re-intubation  for acute respiratory failure; probable aspiration pneumonia -7/13-GI bleed   Plan/Recommendations:   1. Continue daily drive line dressing changes with patient wife.  2. Please call VAD pager with patient and equipment concerns.   Hessie DienerMolly Reece RN, VAD Coordinator 24/7 pager [phone]

## 2017-05-08 NOTE — Procedures (Signed)
Objective Swallowing Evaluation: Type of Study: FEES-Fiberoptic Endoscopic Evaluation of Swallow  Patient Details  Name: James [name] MRN: [id number] Date of Birth: 10-10-1961  Today's Date: 05/08/2017 Time: SLP Start Time (ACUTE ONLY): 1035-SLP Stop Time (ACUTE ONLY): 1110 SLP Time Calculation (min) (ACUTE ONLY): 35 min  Past Medical History:  Past Medical History:  Diagnosis Date  . AICD (automatic cardioverter/defibrillator) present 09/2015   [facility] [place] VR model [id number] (serial Number [id number]) ICD   . CHF (congestive heart failure) (HCC)   . Chronic edema   . CMI (chronic mesenteric ischemia) (HCC)   . Coronary artery disease   . Dyspnea   . Gout   . Gout   . Hypertension   . Ischemic cardiomyopathy    EF 25%  . Myocardial infarction [facility])    "they saw I'd had one; not sure when but it was before 2012"  . Obesity   . Renal insufficiency   . Type II diabetes mellitus (HCC)    Past Surgical History:  Past Surgical History:  Procedure Laterality Date  . CARDIAC CATHETERIZATION  11/2010  . CARDIAC CATHETERIZATION N/A 07/15/2016   Procedure: Right Heart Cath;  Surgeon: Laurey Morale, MD;  Location: [facility] INVASIVE CV LAB;  Service: Cardiovascular;  Laterality: N/A;  . CORONARY ARTERY BYPASS GRAFT  11/2010   Sequential left internal mammary graft to the mid and distal LAD, sequential sapenous vein graft to the first, second, and third obutuse marginal branches  of the  left circumflex  coronary artery.  Saphenous vein graft to the distal right coronary artery.  . ENTEROSCOPY N/A 07-May-2017   Procedure: ENTEROSCOPY;  Surgeon: Ruffin Frederick, MD;  Location: [facility] ENDOSCOPY;  Service: Gastroenterology;  Laterality: N/A;  . EP IMPLANTABLE DEVICE N/A 10/06/2015   Procedure: ICD Implant;  Surgeon: Will Jorja Loa, MD; [facility] [place] VR model [id number] (serial Number [id number]) ICD implanted L chest   . HOT HEMOSTASIS N/A 05-07-2017   Procedure: HOT  HEMOSTASIS (ARGON PLASMA COAGULATION/BICAP);  Surgeon: Ruffin Frederick, MD;  Location: [facility] ENDOSCOPY;  Service: Gastroenterology;  Laterality: N/A;  . INSERTION OF IMPLANTABLE LEFT VENTRICULAR ASSIST DEVICE N/A 04/08/2017   Procedure: INSERTION OF IMPLANTABLE LEFT VENTRICULAR ASSIST DEVICE;  Surgeon: Alleen Borne, MD;  Location: [facility] OR;  Service: Open Heart Surgery;  Laterality: N/A;  CIRC ARREST  NITRIC OXIDE  . RIGHT HEART CATH N/A 11/27/2016   Procedure: Right Heart Cath;  Surgeon: Laurey Morale, MD;  Location[facility] INVASIVE CV LAB;  Service: Cardiovascular;  Laterality: N/A;  . RIGHT HEART CATH N/A 03/24/2017   Procedure: Right Heart Cath;  Surgeon: Laurey Morale, MD;  Location: [facility] INVASIVE CV LAB;  Service: Cardiovascular;  Laterality: N/A;  . RIGHT HEART CATH N/A 03/26/2017   Procedure: Right Heart Cath;  Surgeon: Laurey Morale, MD;  Location: [facility] INVASIVE CV LAB;  Service: Cardiovascular;  Laterality: N/A;  . SHOULDER OPEN ROTATOR CUFF REPAIR Right ~ 2000  . TEE WITHOUT CARDIOVERSION N/A 03/30/2017   Procedure: TRANSESOPHAGEAL ECHOCARDIOGRAM (TEE);  Surgeon: Alleen Borne, MD;  Location: [facility] OR;  Service: Open Heart Surgery;  Laterality: N/A;   HPI: 56 year old male admitted 03/27/2017 for LVAD placement. PMH significant for gout, DM2, HTN, CKD, CAD s/p CABGx6 (2012), systemic heart failure, [employer] ICD. Pt has had 3 intubations this admission. Extubated this morning.  Subjective: Pt resting in bed. Wife present   Assessment / Plan / Recommendation  CHL IP CLINICAL IMPRESSIONS  05/08/2017  Clinical Impression Patient presents with what this SLP suspects is a primary esophageal dysphagia. Patient intubated x 3 but for only 24 hrs each time, most recently extubated 7/18. Swallowing function characterized by decreased laryngeal closure, pharyngeal squeeze resulting in mild diffuse pharyngeal residuals post swallow, ? origin, deconditioning vs suspected increased pressure below the UES  with acute GI bleed/ileus as patient with consistent backflow of bolus through the UES into the CP space increasing risk of aspiration. Patient able to protect airway however with solids and thin liquids when consumed via small cup sips with head in neutral position, compensating for residue with multiple rapid dry swallows. Attempted chin tuck to widen UES space however unsuccessful and lead to silent aspiration. Recommend initiation of full liquids only at this time (per MD, patient needs dietary supplements) with use of strict aspiration precautions as decreased clearance of solids from the esophagus will significantly increase aspiration risk. MBS would be ideal to obtain a better view of esophageal function during the swallow however per RN, patient unable to leave floor for testing. Will continue to monitor closely at bedside, with recommendations for repeat instrumental testing prior to advancing to solids despite airway protection.    SLP Visit Diagnosis Dysphagia, pharyngoesophageal phase (R13.14)  Attention and concentration deficit following --  Frontal lobe and executive function deficit following --  Impact on safety and function Moderate aspiration risk      CHL IP TREATMENT RECOMMENDATION 05/08/2017  Treatment Recommendations Therapy as outlined in treatment plan below     Prognosis 05/08/2017  Prognosis for Safe Diet Advancement Good  Barriers to Reach Goals --  Barriers/Prognosis Comment --    CHL IP DIET RECOMMENDATION 05/08/2017  SLP Diet Recommendations Thin liquid  Liquid Administration via Cup;No straw  Medication Administration Crushed with puree  Compensations Slow rate;Small sips/bites;Follow solids with liquid  Postural Changes Seated upright at 90 degrees;Remain semi-upright after after feeds/meals (Comment)      CHL IP OTHER RECOMMENDATIONS 05/08/2017  Recommended Consults --  Oral Care Recommendations Oral care BID  Other Recommendations --      CHL IP FOLLOW  UP RECOMMENDATIONS 05/08/2017  Follow up Recommendations Inpatient Rehab      CHL IP FREQUENCY AND DURATION 05/08/2017  Speech Therapy Frequency (ACUTE ONLY) min 3x week  Treatment Duration 2 weeks           CHL IP ORAL PHASE 05/08/2017  Oral Phase WFL  Oral - Pudding Teaspoon --  Oral - Pudding Cup --  Oral - Honey Teaspoon --  Oral - Honey Cup --  Oral - Nectar Teaspoon --  Oral - Nectar Cup --  Oral - Nectar Straw --  Oral - Thin Teaspoon --  Oral - Thin Cup --  Oral - Thin Straw --  Oral - Puree --  Oral - Mech Soft --  Oral - Regular --  Oral - Multi-Consistency --  Oral - Pill --  Oral Phase - Comment --    CHL IP PHARYNGEAL PHASE 05/08/2017  Pharyngeal Phase Impaired  Pharyngeal- Pudding Teaspoon --  Pharyngeal --  Pharyngeal- Pudding Cup --  Pharyngeal --  Pharyngeal- Honey Teaspoon --  Pharyngeal --  Pharyngeal- Honey Cup --  Pharyngeal --  Pharyngeal- Nectar Teaspoon Reduced airway/laryngeal closure;Reduced pharyngeal peristalsis;Pharyngeal residue - valleculae;Pharyngeal residue - pyriform;Pharyngeal residue - posterior pharnyx;Pharyngeal residue - cp segment  Pharyngeal --  Pharyngeal- Nectar Cup Reduced airway/laryngeal closure;Reduced pharyngeal peristalsis;Pharyngeal residue - valleculae;Pharyngeal residue - pyriform;Pharyngeal residue - posterior pharnyx;Pharyngeal residue -  cp segment;Penetration/Aspiration during swallow  Pharyngeal Material enters airway, passes BELOW cords without attempt by patient to eject out (silent aspiration)  Pharyngeal- Nectar Straw --  Pharyngeal --  Pharyngeal- Thin Teaspoon Reduced airway/laryngeal closure;Reduced pharyngeal peristalsis;Pharyngeal residue - valleculae;Pharyngeal residue - pyriform;Pharyngeal residue - posterior pharnyx;Pharyngeal residue - cp segment  Pharyngeal --  Pharyngeal- Thin Cup Reduced airway/laryngeal closure;Reduced pharyngeal peristalsis;Pharyngeal residue - valleculae;Pharyngeal residue -  pyriform;Pharyngeal residue - posterior pharnyx;Pharyngeal residue - cp segment  Pharyngeal --  Pharyngeal- Thin Straw Reduced airway/laryngeal closure;Reduced pharyngeal peristalsis;Pharyngeal residue - valleculae;Pharyngeal residue - pyriform;Pharyngeal residue - posterior pharnyx;Pharyngeal residue - cp segment;Penetration/Aspiration during swallow  Pharyngeal Material enters airway, passes BELOW cords without attempt by patient to eject out (silent aspiration)  Pharyngeal- Puree Reduced pharyngeal peristalsis;Pharyngeal residue - valleculae;Pharyngeal residue - pyriform;Pharyngeal residue - posterior pharnyx;Pharyngeal residue - cp segment  Pharyngeal --  Pharyngeal- Mechanical Soft Reduced airway/laryngeal closure;Reduced pharyngeal peristalsis;Pharyngeal residue - valleculae  Pharyngeal --  Pharyngeal- Regular --  Pharyngeal --  Pharyngeal- Multi-consistency --  Pharyngeal --  Pharyngeal- Pill --  Pharyngeal --  Pharyngeal Comment --     CHL IP CERVICAL ESOPHAGEAL PHASE 05/08/2017  Cervical Esophageal Phase Impaired  Pudding Teaspoon --  Pudding Cup --  Honey Teaspoon --  Honey Cup --  Nectar Teaspoon --  Nectar Cup --  Nectar Straw --  Thin Teaspoon --  Thin Cup --  Thin Straw --  Puree --  Mechanical Soft --  Regular --  Multi-consistency --  Pill --  Cervical Esophageal Comment see clinical impression statement    Ferdinand Lango MA, CCC-SLP [phone]  Roselynn Whitacre Meryl 05/08/2017, 1:45 PM

## 2017-05-08 NOTE — Progress Notes (Signed)
Inpatient Rehabilitation  Continuing to follow along for timing of medical readiness prior to initiating insurance authorization.  Please call with questions.   Charlane FerrettiMelissa Mozelle Remlinger, M.A., CCC/SLP Admission Coordinator  [facility]  Cell [phone]

## 2017-05-08 NOTE — Procedures (Signed)
History: 56 year old male with new onset of vocal tics and echolalia  Sedation: None  Technique: This is a 21 channel routine scalp EEG performed at the bedside with bipolar and monopolar montages arranged in accordance to the international 10/20 system of electrode placement. One channel was dedicated to EKG recording.    Background: The background consists of intermixed alpha and beta activities. There is a well defined posterior dominant rhythm of 9 Hz that attenuates with eye opening. Sleep is not recorded but there is an increase in delta activity associated with drowsiness.  Photic stimulation: Physiologic driving is not performed  EEG Abnormalities: None  Clinical Interpretation: This normal EEG is recorded in the waking and drowsy state. There was no seizure or seizure predisposition recorded on this study. Please note that a normal EEG does not preclude the possibility of epilepsy.   Ritta SlotMcNeill Sivan Cuello, MD [facility] [phone]  If 7pm- 7am, please page neurology on call as listed in AMION.

## 2017-05-08 NOTE — Progress Notes (Signed)
STAT EEG completed; results pending. Dr Kirkpatrick notified. 

## 2017-05-08 NOTE — Consult Note (Signed)
Requesting Physician: Heart failure team    Chief Complaint: possible CVA  History obtained from:  chart  HPI:                                                                                                                                         James [name] is an 56 y.o. male "with CHF (EF 15%), AICD, CAD, Gout, HTN, CKD, DM, Anemia, admitted 04/09/2017 for VAD placement, as he had already been turned down by [facility] for transplant due to his gout and limitations with his mobility. Now s/p HVAD placement for his ischemic cardiomyopathy. His course has been complicated by AKI-on-CKD, and also complicated by pre-op Afib with RVR. He developed vomiting of feculent material and was found to have an ileus. An NG tube was placed to suction. However, patient then developed acute hypoxic respiratory failure requiring 15L O2 NRB, and he developed worsening respiratory distress, He was also found to have GI bleeding due to AVM  Thus all blood thinners were stopped. Today he was noted to be confused, repeating phrases and words. Concern for embolic CVA and patient was brought to CT as he cannot have a MRI. Neurology was consulted for possible CVA.   Of note patient was on a lidocaine drip over this has been DC'd. Lidocaine over prolonged period time can cause confusion thus  lidocaine was stopped.  Date last known well: Date: 05/07/2017 Time last known well: Unable to determine tPA Given: No: recent surgery and no last known normal along with GI bleed  Important labs: BUN 79 Creatinine 1.95 BNP 670 WBC 13.2 INR 2.24    Past Medical History:  Diagnosis Date  . AICD (automatic cardioverter/defibrillator) present 09/2015   [facility] VR model FA2130-86V (serial Number [id number]) ICD   . CHF (congestive heart failure) ([place])   . Chronic edema   . CMI (chronic mesenteric ischemia) ([place])   . Coronary artery disease   . Dyspnea   . Gout   . Gout   . Hypertension   . Ischemic cardiomyopathy     EF 25%  . Myocardial infarction [facility])    "they saw I'd had one; not sure when but it was before 2012"  . Obesity   . Renal insufficiency   . Type II diabetes mellitus ([place])     Past Surgical History:  Procedure Laterality Date  . CARDIAC CATHETERIZATION  11/2010  . CARDIAC CATHETERIZATION N/A 07/15/2016   Procedure: Right Heart Cath;  Surgeon: Larey Dresser, MD;  Location: [place] CV LAB;  Service: Cardiovascular;  Laterality: N/A;  . CORONARY ARTERY BYPASS GRAFT  11/2010   Sequential left internal mammary graft to the mid and distal LAD, sequential sapenous vein graft to the first, second, and third obutuse marginal branches  of the  left circumflex  coronary artery.  Saphenous vein graft to the distal right coronary  artery.  . ENTEROSCOPY N/A 05/13/2017   Procedure: ENTEROSCOPY;  Surgeon: Manus Gunning, MD;  Location: [facility] ENDOSCOPY;  Service: Gastroenterology;  Laterality: N/A;  . EP IMPLANTABLE DEVICE N/A 10/06/2015   Procedure: ICD Implant;  Surgeon: Will Meredith Leeds, MD; [place] [id number] (serial Number [id number]) ICD implanted L chest   . HOT HEMOSTASIS N/A 05/01/2017   Procedure: HOT HEMOSTASIS (ARGON PLASMA COAGULATION/BICAP);  Surgeon: Manus Gunning, MD;  Location: [employer];  Service: Gastroenterology;  Laterality: N/A;  . INSERTION OF IMPLANTABLE LEFT VENTRICULAR ASSIST DEVICE N/A 04/19/2017   Procedure: INSERTION OF IMPLANTABLE LEFT VENTRICULAR ASSIST DEVICE;  Surgeon: Gaye Pollack, MD;  Location: [place];  Service: Open Heart Surgery;  Laterality: N/A;  CIRC ARREST  NITRIC OXIDE  . RIGHT HEART CATH N/A 11/27/2016   Procedure: Right Heart Cath;  Surgeon: Larey Dresser, MD;  Location: [place] CV LAB;  Service: Cardiovascular;  Laterality: N/A;  . RIGHT HEART CATH N/A 04/18/2017   Procedure: Right Heart Cath;  Surgeon: Larey Dresser, MD;  Location: [place] CV LAB;  Service: Cardiovascular;  Laterality: N/A;  .  RIGHT HEART CATH N/A 04/08/2017   Procedure: Right Heart Cath;  Surgeon: Larey Dresser, MD;  Location: [place] CV LAB;  Service: Cardiovascular;  Laterality: N/A;  . SHOULDER OPEN ROTATOR CUFF REPAIR Right ~ 2000  . TEE WITHOUT CARDIOVERSION N/A 04/10/2017   Procedure: TRANSESOPHAGEAL ECHOCARDIOGRAM (TEE);  Surgeon: Gaye Pollack, MD;  Location: [place];  Service: Open Heart Surgery;  Laterality: N/A;    Family History  Problem Relation Age of Onset  . Hypertension Mother   . Diabetes type II Mother   . Hypertension Father   . Hypertension Sister   . Hypertension Paternal Grandmother   . Diabetes Other   . Hypertension Other    Social History:  reports that he has never smoked. He has quit using smokeless tobacco. His smokeless tobacco use included Chew. He reports that he drinks alcohol. He reports that he does not use drugs.  Allergies: No Active Allergies  Medications:                                                                                                                           Prior to Admission:  Prescriptions Prior to Admission  Medication Sig Dispense Refill Last Dose  . acetaminophen (TYLENOL) 500 MG tablet Take 500-1,000 mg by mouth every 4 (four) hours as needed for headache (pain).   4-5 days  . aspirin EC 81 MG tablet Take 81 mg by mouth daily.   04/06/2017 at [place]  . atorvastatin (LIPITOR) 80 MG tablet TAKE ONE TABLET BY MOUTH DAILY 90 tablet 3 04/10/2017 at Unknown time  . carvedilol (COREG) 25 MG tablet Take 0.5 tablets (12.5 mg total) by mouth 2 (two) times daily with a meal. Please schedule appointment for refills. 180 tablet 3 03/22/2017 at 0745  . Colchicine (MITIGARE) 0.6  MG CAPS Take 1 capsule by mouth 2 (two) times daily.   04/10/2017 at Unknown time  . colchicine 0.6 MG tablet Take 1 tablet (0.6 mg total) by mouth daily. (Patient taking differently: Take 0.6 mg by mouth daily as needed (gout). ) 30 tablet 6 04/09/2017 at Unknown time  . digoxin  (LANOXIN) 0.125 MG tablet Take 0.5 tablets (0.0625 mg total) by mouth daily. 45 tablet 3 04/19/2017 at 0745  . febuxostat (ULORIC) 40 MG tablet Take 120 mg by mouth daily.   04/02/2017 at [place]  . glipiZIDE (GLUCOTROL) 5 MG tablet TAKE 1 TABLET (5 MG TOTAL) BY MOUTH 2 (TWO) TIMES DAILY BEFORE MEALS (REFER FUTURE REFILLS TO PRIMARY CARE PHYSICIAN) 180 tablet 0 04/16/2017 at 0745  . isosorbide-hydrALAZINE (BIDIL) 20-37.5 MG tablet Take 1.5 tablets by mouth 3 (three) times daily. 90 tablet 0 03/27/2017 at 0745  . nitroGLYCERIN (NITROSTAT) 0.4 MG SL tablet Place 1 tablet (0.4 mg total) under the tongue every 5 (five) minutes as needed for chest pain. 25 tablet 2 PRN  . torsemide (DEMADEX) 20 MG tablet Take 2 tablets (40 mg total) by mouth daily. 60 tablet 3 04/10/2017 at Unknown time   Scheduled: . chlorhexidine gluconate (MEDLINE KIT)  15 mL Mouth Rinse BID  . Chlorhexidine Gluconate Cloth  6 each Topical Daily  . febuxostat  40 mg Oral Daily  . feeding supplement  1 Container Oral BID BM  . feeding supplement (ENSURE ENLIVE)  237 mL Oral Q24H  . fentaNYL (SUBLIMAZE) injection  50 mcg Intravenous Once  . hydrALAZINE  25 mg Oral Q8H  . insulin aspart  0-15 Units Subcutaneous TID WC  . mouth rinse  15 mL Mouth Rinse QID  . metoCLOPramide (REGLAN) injection  5 mg Intravenous Q8H  . [START ON 05/09/2017] mexiletine  200 mg Oral Q8H  . pantoprazole (PROTONIX) IV  40 mg Intravenous Q12H  . sodium chloride flush  10-40 mL Intracatheter Q12H  . sodium chloride flush  3 mL Intravenous Q12H  . warfarin  1 mg Oral ONCE-1800  . Warfarin - Pharmacist Dosing Inpatient   Does not apply q1800    ROS:                                                                                                                                       History obtained from unobtainable from patient due to expressive aphasia  General ROS: negative for - chills, fatigue, fever, night sweats, weight gain or weight loss Psychological  ROS: negative for - behavioral disorder, hallucinations, memory difficulties, mood swings or suicidal ideation Ophthalmic ROS: negative for - blurry vision, double vision, eye pain or loss of vision ENT ROS: negative for - epistaxis, nasal discharge, oral lesions, sore throat, tinnitus or vertigo Allergy and Immunology ROS: negative for - hives or itchy/watery eyes Hematological and Lymphatic ROS: negative for - bleeding problems, bruising or swollen lymph nodes  Endocrine ROS: negative for - galactorrhea, hair pattern changes, polydipsia/polyuria or temperature intolerance Respiratory ROS: negative for - cough, hemoptysis, shortness of breath or wheezing Cardiovascular ROS: negative for - chest pain, dyspnea on exertion, edema or irregular heartbeat Gastrointestinal ROS: negative for - abdominal pain, diarrhea, hematemesis, nausea/vomiting or stool incontinence Genito-Urinary ROS: negative for - dysuria, hematuria, incontinence or urinary frequency/urgency Musculoskeletal ROS: negative for - joint swelling or muscular weakness Neurological ROS: as noted in HPI Dermatological ROS: negative for rash and skin lesion changes  Neurologic Examination:                                                                                                      Blood pressure (!) 84/73, pulse 71, temperature 98.2 F (36.8 C), temperature source Oral, resp. rate (!) 21, height 6' (1.829 m), weight 106.5 kg (234 lb 12.6 oz), SpO2 100 %.  HEENT-  Normocephalic, no lesions, without obvious abnormality.  Normal external eye and conjunctiva.  Normal TM's bilaterally.  Normal auditory canals and external ears. Normal external nose, mucus membranes and septum.  Normal pharynx. Cardiovascular- S1, S2 normal, pulses palpable throughout   Lungs- chest clear, no wheezing, rales, normal symmetric air entry Abdomen- normal findings: bowel sounds normal Extremities- no edema Lymph-no adenopathy palpable Musculoskeletal-no  joint tenderness, deformity or swelling Skin-warm and dry, no hyperpigmentation, vitiligo, or suspicious lesions  Neurological Examination Mental Status: Alert, oriented, thought content appropriate.  Speech fluent without evidence of aphasia--he is able to name objects, follow commands, express himself, repeat words.  There was one point in which the nurse practitioner was talking to him and he was constantly repeating her but this was short-lived. Able to follow simple commands without difficulty. Cranial Nerves: II:  Visual fields grossly normal,  III,IV, VI: ptosis not present, extra-ocular motions intact bilaterally, pupils equal, round, reactive to light and accommodation V,VII: smile symmetric, facial light touch sensation normal bilaterally VIII: hearing normal bilaterally IX,X: uvula rises symmetrically XI: bilateral shoulder shrug XII: midline tongue extension Motor: Moving all extremities antigravity with no drift Sensory: Pinprick and light touch intact throughout, bilaterally Deep Tendon Reflexes: 1+ and symmetric throughout upper extremities and knee jerks but no ankle jerks Plantars: Right: downgoing   Left: downgoing Cerebellar: normal finger-to-nose,  Gait: Not tested       Lab Results: Basic Metabolic Panel:  Recent Labs Lab 05/03/17 0300  04/26/2017 0332 05/11/2017 2104 04/30/2017 0402 04/30/2017 1453 05/07/17 0448 05/08/17 0339  NA 133*  < > 134* 136 136 137 134* 135  K 4.2  < > 3.8 4.0 4.0 3.7 4.1 4.2  CL 104  < > 107 108 108  --  107 109  CO2 22  < > 21* 20* 20*  --  19* 18*  GLUCOSE 201*  < > 176* 217* 229* 231* 201* 164*  BUN 83*  < > 80* 78* 81*  --  83* 79*  CREATININE 2.02*  < > 1.88* 1.87* 1.94*  --  2.08* 1.95*  CALCIUM 7.6*  < > 7.3* 7.5* 7.5*  --  7.6* 7.8*  MG 1.9  --   --   --  1.8  --  2.0 2.0  PHOS  --   --   --   --   --   --   --  4.7*  < > = values in this interval not displayed.  Liver Function Tests:  Recent Labs Lab  05/04/17 0355  AST 21  ALT 11*  ALKPHOS 56  BILITOT 0.7  PROT 4.2*  ALBUMIN 1.7*   No results for input(s): LIPASE, AMYLASE in the last 168 hours. No results for input(s): AMMONIA in the last 168 hours.  CBC:  Recent Labs Lab 05/02/17 1535  04/21/2017 0332 05/14/2017 1414 05/17/2017 2104 04/27/2017 0402 05/18/2017 1453 05/07/17 0448 05/08/17 0339  WBC 8.9  < > 12.3* 13.9* 17.4* 17.5*  --  16.8* 13.2*  NEUTROABS 7.2  --  10.5*  --   --   --   --   --   --   HGB 7.3*  < > 6.5* 7.4* 8.5* 7.5* 8.2* 9.3* 8.2*  HCT 21.7*  < > 19.1* 21.9* 24.9* 22.4* 24.0* 26.7* 24.7*  MCV 87.1  < > 88.8 87.6 86.5 86.8  --  85.9 87.9  PLT 213  < > 160 162 166 178  --  197 185  < > = values in this interval not displayed.  Cardiac Enzymes: No results for input(s): CKTOTAL, CKMB, CKMBINDEX, TROPONINI in the last 168 hours.  Lipid Panel: No results for input(s): CHOL, TRIG, HDL, CHOLHDL, VLDL, LDLCALC in the last 168 hours.  CBG:  Recent Labs Lab 05/07/17 1209 05/07/17 1700 05/07/17 2204 05/08/17 0753 05/08/17 1142  GLUCAP 171* 136* 138* 160* 144*    Microbiology: Results for orders placed or performed during the hospital encounter of 04/04/2017  Surgical pcr screen     Status: Abnormal   Collection Time: 03/29/2017  3:01 PM  Result Value Ref Range Status   MRSA, PCR POSITIVE (A) NEGATIVE Final    Comment: RESULT CALLED TO, READ BACK BY AND VERIFIED WITH: V MENDEZ RN 2003 03/25/2017 A BROWNING    Staphylococcus aureus POSITIVE (A) NEGATIVE Final    Comment:        The Xpert SA Assay (FDA approved for NASAL specimens in patients over 14 years of age), is one component of a comprehensive surveillance program.  Test performance has been validated by [facility] for patients greater than or equal to 50 year old. It is not intended to diagnose infection nor to guide or monitor treatment.   Culture, blood (x 2)     Status: None   Collection Time: 04/24/17  2:23 AM  Result Value Ref Range  Status   Specimen Description BLOOD A-LINE DRAW  Final   Special Requests   Final    BOTTLES DRAWN AEROBIC AND ANAEROBIC Blood Culture adequate volume   Culture NO GROWTH 5 DAYS  Final   Report Status 04/29/2017 FINAL  Final  Culture, blood (x 2)     Status: None   Collection Time: 04/24/17  8:20 AM  Result Value Ref Range Status   Specimen Description BLOOD LEFT ANTECUBITAL  Final   Special Requests IN PEDIATRIC BOTTLE Blood Culture adequate volume  Final   Culture NO GROWTH 5 DAYS  Final   Report Status 04/29/2017 FINAL  Final  Culture, respiratory (NON-Expectorated)     Status: None   Collection Time: 04/24/17 11:33 AM  Result Value Ref Range Status   Specimen Description TRACHEAL ASPIRATE  Final  Special Requests NONE  Final   Gram Stain   Final    FEW WBC PRESENT,BOTH PMN AND MONONUCLEAR RARE GRAM NEGATIVE RODS    Culture   Final    FEW KLEBSIELLA PNEUMONIAE FEW CITROBACTER KOSERI    Report Status 04/27/2017 FINAL  Final   Organism ID, Bacteria KLEBSIELLA PNEUMONIAE  Final   Organism ID, Bacteria CITROBACTER KOSERI  Final      Susceptibility   Citrobacter koseri - MIC*    CEFAZOLIN <=4 SENSITIVE Sensitive     CEFEPIME <=1 SENSITIVE Sensitive     CEFTAZIDIME <=1 SENSITIVE Sensitive     CEFTRIAXONE <=1 SENSITIVE Sensitive     CIPROFLOXACIN <=0.25 SENSITIVE Sensitive     GENTAMICIN <=1 SENSITIVE Sensitive     IMIPENEM <=0.25 SENSITIVE Sensitive     TRIMETH/SULFA <=20 SENSITIVE Sensitive     PIP/TAZO 16 SENSITIVE Sensitive     * FEW CITROBACTER KOSERI   Klebsiella pneumoniae - MIC*    AMPICILLIN >=32 RESISTANT Resistant     CEFAZOLIN <=4 SENSITIVE Sensitive     CEFEPIME <=1 SENSITIVE Sensitive     CEFTAZIDIME <=1 SENSITIVE Sensitive     CEFTRIAXONE <=1 SENSITIVE Sensitive     CIPROFLOXACIN <=0.25 SENSITIVE Sensitive     GENTAMICIN <=1 SENSITIVE Sensitive     IMIPENEM <=0.25 SENSITIVE Sensitive     TRIMETH/SULFA <=20 SENSITIVE Sensitive     AMPICILLIN/SULBACTAM 4  SENSITIVE Sensitive     PIP/TAZO <=4 SENSITIVE Sensitive     Extended ESBL NEGATIVE Sensitive     * FEW KLEBSIELLA PNEUMONIAE  Culture, respiratory (NON-Expectorated)     Status: None   Collection Time: 04/26/17  8:15 AM  Result Value Ref Range Status   Specimen Description TRACHEAL ASPIRATE  Final   Special Requests NONE  Final   Gram Stain   Final    ABUNDANT WBC PRESENT, PREDOMINANTLY PMN FEW SQUAMOUS EPITHELIAL CELLS PRESENT RARE GRAM NEGATIVE RODS RARE GRAM POSITIVE COCCI IN PAIRS    Culture MODERATE KLEBSIELLA PNEUMONIAE  Final   Report Status 04/28/2017 FINAL  Final   Organism ID, Bacteria KLEBSIELLA PNEUMONIAE  Final      Susceptibility   Klebsiella pneumoniae - MIC*    AMPICILLIN >=32 RESISTANT Resistant     CEFAZOLIN <=4 SENSITIVE Sensitive     CEFEPIME <=1 SENSITIVE Sensitive     CEFTAZIDIME <=1 SENSITIVE Sensitive     CEFTRIAXONE <=1 SENSITIVE Sensitive     CIPROFLOXACIN <=0.25 SENSITIVE Sensitive     GENTAMICIN <=1 SENSITIVE Sensitive     IMIPENEM <=0.25 SENSITIVE Sensitive     TRIMETH/SULFA <=20 SENSITIVE Sensitive     AMPICILLIN/SULBACTAM 4 SENSITIVE Sensitive     PIP/TAZO <=4 SENSITIVE Sensitive     Extended ESBL NEGATIVE Sensitive     * MODERATE KLEBSIELLA PNEUMONIAE    Coagulation Studies:  Recent Labs  05/02/2017 0402 05/07/2017 1130 05/07/17 0448 05/08/17 0339  LABPROT 24.8* 24.5* 20.6* 25.2*  INR 2.20 2.17 1.74 2.24    Imaging: Dg Chest Port 1 View  Result Date: 05/07/2017 CLINICAL DATA:  LVAD placement EXAM: PORTABLE CHEST 1 VIEW COMPARISON:  05/16/2017 FINDINGS: Endotracheal tube has been retracted. The tip is 10 mm from the carina. NG tube has been placed with its tip in the fundus of the stomach. The stomach is decompressed. Right upper extremity PICC is stable. The heart remains mildly enlarged. LVAD device is stable at the cardiac apex. Left subclavian AICD device and lead are stable. There is persistent consolidation at the left  lung base.  Lungs are under aerated with minimal atelectasis at the right lung base. There is a linear interface at the left apex and a 5% left apical pneumothorax is suspected. IMPRESSION: Endotracheal tube as been retracted and the tip is now 10 mm from the carina. 5% left apical pneumothorax is suspected. NG tube placed into the stomach. Stable left basilar consolidation and minimal atelectasis at the right base. Electronically Signed   By: Marybelle Killings M.D.   On: 05/07/2017 08:01   Dg Chest Port 1 View  Result Date: 05/16/2017 CLINICAL DATA:  Check endotracheal tube placement EXAM: PORTABLE CHEST 1 VIEW COMPARISON:  05/04/2017 FINDINGS: Cardiac shadow remains enlarged. Endotracheal tube is noted at the level of the carina directed towards right mainstem bronchus. This should be withdrawn and approximately 2 cm. Right-sided PICC line is noted in the mid superior vena cava. Defibrillator lead and left ventricular assist device are again noted and stable. No enteric catheter is identified. Clinical correlation is recommended. IMPRESSION: Endotracheal tube at the carina directed towards right mainstem bronchus. Should be withdrawn 2 cm. Tubes and lines as described.  No anterior catheter is seen. These results will be called to the ordering clinician or representative by the Radiologist Assistant, and communication documented in the PACS or zVision Dashboard. Electronically Signed   By: Inez Catalina M.D.   On: 05/10/2017 18:19   Dg Abd Portable 1v  Result Date: 05/01/2017 CLINICAL DATA:  Status post enteric catheter placement EXAM: PORTABLE ABDOMEN - 1 VIEW COMPARISON:  Film from earlier in the same day FINDINGS: The catheter has been advanced and now lies coiled within the stomach. The remainder of the study is stable. IMPRESSION: Enteric catheter within the stomach. Electronically Signed   By: Inez Catalina M.D.   On: 05/19/2017 20:28   Dg Abd Portable 1v  Result Date: 04/21/2017 CLINICAL DATA:  Status post enteric  catheter placement EXAM: PORTABLE ABDOMEN - 1 VIEW COMPARISON:  Film from earlier in the same day FINDINGS: A new enteric catheter is noted with the catheter tip extending into the stomach. The proximal side port however lies in the distal esophagus. Remainder the exam is stable. IMPRESSION: Enteric catheter as described. Electronically Signed   By: Inez Catalina M.D.   On: 04/27/2017 20:27   Dg Abd Portable 1v  Result Date: 05/11/2017 CLINICAL DATA:  Check nasogastric catheter placement EXAM: PORTABLE ABDOMEN - 1 VIEW COMPARISON:  04/27/2016 FINDINGS: Scattered large and small bowel gas is noted. Left ventricular assist device and a defibrillator wire are again seen. A central line is noted at the cavoatrial junction. No enteric catheter is seen. IMPRESSION: No enteric catheter is noted. Electronically Signed   By: Inez Catalina M.D.   On: 04/21/2017 20:27       Assessment and plan discussed with with attending physician and they are in agreement.    Etta Quill PA-C [facility] [phone]  05/08/2017, 2:47 PM  I have seen and evaluated the patient. I have reviewed the above note and made appropriate changes.       Assessment: 56 y.o. male with new onset confusion, vocal tics,  and echolalia post HVAD placement and cessation of anticoagulation status post surgical ablation of bleeding AVMs. Although patient's INR is therapeutic at this point patient was subtherapeutic within the last 24 hours for surgery. Differential at this time includes lidocaine toxicity, delirium, versus less likely seizure and stroke.   Stroke Risk Factors - diabetes mellitus and hypertension  Recommend: 1  agree with stopping lidocaine 2 EEG 3 repeat CT of head in 48 hours 4 . Keep sedating drugs to minimum 5. Will follow   Roland Rack, MD [facility] [phone]  If 7pm- 7am, please page neurology on call as listed in [place].

## 2017-05-08 NOTE — Progress Notes (Signed)
Physical Therapy Treatment Patient Details Name: James [name] MRN: [id number] DOB: Dec 31, 1960 Today's Date: 05/08/2017    History of Present Illness  Pt adm with acute on chronic heart failure and underwent Heartware HVAD implant on 6/28. Returned to OR later that day for evacuation of mediastinal hematoma. On 7/4 developed ileus with likely aspiration and intubated 7/4-7/6.   Pt developed Heme + stools with progressive anemia as well as  James [name] 05/02/17.  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 He was intubated for procedure and extubated 05/08/17 PMH - gout, DMII, HTN, CKD, CAD S/P CABGx6 2012, chronic systolic heart failure and [employer] jude ICD.     PT Comments    Pt admitted with above diagnosis. Pt currently with functional limitations due to fluctuation in medical status. Pt with impaired cognition today, demonstrating focused to brief periods of sustained attention, decr initiation, and impaired problem solving.  He requires mod +2 for bed mobility, max +2 sit to stand and total assist +2 for squat pivot transfer to chair, as he was unable to shift weight or take steps.  Goals unmet due to fluctuation in medical status, thus revised goals today.       Pt will benefit from skilled PT to increase their independence and safety with mobility to allow discharge to the venue listed below.     Follow Up Recommendations  CIR;Supervision for mobility/OOB     Equipment Recommendations  Other (comment) (TBD)    Recommendations for Other Services Rehab consult     Precautions / Restrictions Precautions Precautions: Sternal Precaution Comments: Heartware HVAD Restrictions Weight Bearing Restrictions:  (sternal precautions) Other Position/Activity Restrictions: Sternal precautions    Mobility  Bed Mobility Overal bed mobility: Needs Assistance Bed Mobility: Supine to Sit   Sidelying to sit: Mod assist;+2 for physical assistance       General bed mobility comments: Pt requires  cues to initiate and sequence task as well as assist to move LEs off bed and to lift trunk.  Max A +2 for scooting to EOB   Transfers Overall transfer level: Needs assistance Equipment used: Rolling walker (2 wheeled) (EVA walker ) Transfers: Sit to/from [employer] Sit to Stand: Max assist;+2 physical assistance;From elevated surface   Squat pivot transfers: Total assist;+2 physical assistance     General transfer comment: Pt required A for forward momentum and to boost hips from bed.  Assist to extend hips, knees and trunk - demonstrates signficant difficulty achieving upright/erect posture.  Attempted standing x 2.  Pt unable to shift weight, or take steps.  Squat pivot to recliner performed - he required assist to lift buttocks, and assist to pivot both LEs, as well as assist to scoot hips in chair   Ambulation/Gait                 Stairs            Wheelchair Mobility    Modified Rankin (Stroke Patients Only)       Balance Overall balance assessment: Needs assistance Sitting-balance support: Feet supported Sitting balance-Leahy Scale: Fair Sitting balance - Comments: Pt requires close min guard assist for static EOB sitting    Standing balance support: Bilateral upper extremity supported Standing balance-Leahy Scale: Poor Standing balance comment: Pt stood x 2.  He was able to maintain static standing with bil. UE support with mod A +2 for brief period before fatiguing and requiring max A +2  Cognition Arousal/Alertness: Awake/alert Behavior During Therapy: Flat affect Overall Cognitive Status: Impaired/Different from baseline Area of Impairment: Attention;Memory;Following commands;Safety/judgement;Awareness;Problem solving                   Current Attention Level: Focused;Sustained Memory: Decreased short-term memory;Decreased recall of precautions Following Commands: Follows one step commands  consistently;Follows one step commands with increased time (requires min - mod cues intermittently ) Safety/Judgement: Decreased awareness of safety   Problem Solving: Slow processing;Decreased initiation;Difficulty sequencing;Requires verbal cues;Requires tactile cues General Comments: Pt very slow to process information, difficulty executing simple commands - will do so, with increased time.   Attention varied between focused with periods of brief sustained attention.  Pt repeating back to PT what PT said.       Exercises      General Comments General comments (skin integrity, edema, etc.): Bil UE and LE edema      Pertinent Vitals/Pain Pain Assessment: Faces Faces Pain Scale: Hurts a little bit Pain Location: generalized.  Pt endorses hand pain due to gout  Pain Descriptors / Indicators: Grimacing;Sore Pain Intervention(s): Limited activity within patient's tolerance;Monitored during session;Repositioned    Home Living                      Prior Function            PT Goals (current goals can now be found in the care plan section) Acute Rehab PT Goals Patient Stated Goal: to go home PT Goal Formulation: With patient/family Time For Goal Achievement: 05/22/17 Potential to Achieve Goals: Good Progress towards PT goals: Not progressing toward goals - comment (Confusion)    Frequency    Min 3X/week      PT Plan Current plan remains appropriate    Co-evaluation PT/OT/SLP Co-Evaluation/Treatment: Yes Reason for Co-Treatment: Complexity of the patient's impairments (multi-system involvement) PT goals addressed during session: Mobility/safety with mobility OT goals addressed during session: ADL's and self-care      AM-PAC PT "6 Clicks" Daily Activity  Outcome Measure  Difficulty turning over in bed (including adjusting bedclothes, sheets and blankets)?: Total Difficulty moving from lying on back to sitting on the side of the bed? : Total Difficulty  sitting down on and standing up from a chair with arms (e.g., wheelchair, bedside commode, etc,.)?: Total Help needed moving to and from a bed to chair (including a wheelchair)?: Total Help needed walking in hospital room?: Total Help needed climbing 3-5 steps with a railing? : Total 6 Click Score: 6    End of Session Equipment Utilized During Treatment: Gait belt;Oxygen   Patient left: with call bell/phone within reach;in chair;with family/visitor present Nurse Communication: Mobility status;Need for lift equipment PT Visit Diagnosis: Unsteadiness on feet (R26.81);Muscle weakness (generalized) (M62.81);Difficulty in walking, not elsewhere classified (R26.2) Pain - part of body:  (Throat)     Time: [id number] PT Time Calculation (min) (ACUTE ONLY): 48 min  Charges:  $Therapeutic Activity: 8-22 mins                    G Codes:       Howard Patton,PT Acute Rehabilitation [phone] (pager)    Berline Lopesawn F Jazzmine Kleiman 05/08/2017, 2:39 PM

## 2017-05-08 NOTE — Progress Notes (Signed)
Chaplain stopped by as a follow-up to visit with family.  Family is doing a little different today but family is here to provide support.  Chaplain offered prayer.  Family and patient accepted.  Chaplain will follow up as needed.    05/08/17 1634  Clinical Encounter Type  Visited With Patient and family together  Visit Type Follow-up;Psychological support;Spiritual support;Social support  Referral From Nurse  Consult/Referral To Chaplain  Spiritual Encounters  Spiritual Needs Prayer

## 2017-05-08 NOTE — Progress Notes (Addendum)
Patient ID: James [name], male   DOB: 12/13/1960, 56 y.o.   MRN: [id number]   Patient noted to be mildly confused this morning, worsening confusion this afternoon.  Repeating words/phrases.  Seems to know something is wrong.  Oriented to person/place, knows who I am. He has a fine tremor.  - Stop lidocaine, this could be causing acute confusional state/tremor. Getting a level.  - CT head w/o contrast stat.  INR therapeutic today and LDH ok so hopefully not embolic CVA.  - Would have neurology see him with the acute change.   For VT, will use mexiletine now that he is taking po but will start tomorrow.   Start hydralazine for MAP 90s.   Per speech, can start full liquid diet.    Marca AnconaDalton Euriah [name] 05/08/2017 12:46 PM

## 2017-05-08 NOTE — Progress Notes (Addendum)
Patient ID: James [name], male   DOB: [birth year], 56 y.o.   MRN: [id number] \  Advanced Heart Failure VAD Team Note  Subjective:    HVAD placed 6/28.  Returned to the OR that evening with high chest tube output, evacuation of mediastinal hematoma.   Extubated 6/29. Milrinone stopped on 7/4.  Evening of 7/4, patient developed ileus with respiratory compromise. NGT placed with 3 L suctioned out.  CXR with suspicion for aspiration PNA.  Patient had to be intubated.  He went into atrial fibrillation with RVR.  He became hypotensive and was started on norepinephrine and phenylephrine.  Amiodarone gtt begun.     Extubated again on 7/6.   On 7/13 Had 2 sustained episodes of VT. Had to be cardioverted 1. Second episode broke with overdrive pacing with Dr. Caryl Comes. VAD speed turned down to 2700.   GI bleed --> 2 units PRBCs 7/14, 2 units 7/15. 3 U PRBCs 7/16,  04/20/2017 3 UPRBCs.   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  He had epinephrine, APC, and 2 clips. Intubated prior to procedure and placed on norepinephrine. Speed dropped to 2660.   7/18 Back in VT.  With overdrive pacing. Lidocaine was started in addition to amiodarone. Extubated.  Overnight he has maintained NSR. INR 2.2. Creatinine 1.9. CO-OX 70%. No BMs overnight, hgb 8.3.   Anxious and concerned something else is going to go wrong. Denis SOB.   HVAD INTERROGATION:  HVAD:  Flow 4.2 liters/min, speed 2660,  power 4 W,  Peak 7 Trough 2.2 Suction off. Lavare On.    Objective:    Vital Signs:   Temp:  [97.6 F (36.4 C)-98.3 F (36.8 C)] 97.9 F (36.6 C) (07/19 0400) Pulse Rate:  [26-93] 93 (07/19 0700) Resp:  [9-19] 15 (07/19 0700) BP: (84-100)/(73-79) 84/73 (07/18 0842) SpO2:  [87 %-100 %] 87 % (07/19 0700) Arterial Line BP: (81-120)/(70-84) 107/72 (07/19 0700) FiO2 (%):  [40 %] 40 % (07/18 0842) Weight:  [234 lb 12.6 oz (106.5 kg)] 234 lb 12.6 oz (106.5 kg) (07/19  0500) Last BM Date: 05/08/2017 Mean arterial Pressure 70-80s   Intake/Output:   Intake/Output Summary (Last 24 hours) at 05/08/17 0723 Last data filed at 05/08/17 0600  Gross per 24 hour  Intake          3240.35 ml  Output             1040 ml  Net          2200.35 ml     Physical Exam   CVP 4  Physical Exam: GENERAL: Well appearing, male who presents to clinic today in no acute distress. HEENT: normal  NECK: Supple, JVP 5-6 .  2+ bilaterally, no bruits.  No lymphadenopathy or thyromegaly appreciated.  CARDIAC:  Mechanical heart sounds with LVAD hum present.  LUNGS:  Clear to auscultation bilaterally.  ABDOMEN:  Soft, round, nontender, positive bowel sounds x4.     LVAD exit site:   Dressing dry and intact.  No erythema or drainage.  Stabilization device present and accurately applied.  Driveline dressing is being changed daily per sterile technique. EXTREMITIES:  Warm and dry, no cyanosis, clubbing, rash or edema . RUE PICC NEUROLOGIC:  Alert and oriented x 4.  Gait steady.  No aphasia.  No dysarthria.  Affect pleasant.         Telemetry   NSR 60s personally reviewed.   Labs   Basic Metabolic Panel:  Recent Labs Lab 05/03/17 0300  05/11/2017 0332 04/30/2017 2104 05/19/2017 0402 05/01/2017 1453 05/07/17 0448 05/08/17 0339  NA 133*  < > 134* 136 136 137 134* 135  K 4.2  < > 3.8 4.0 4.0 3.7 4.1 4.2  CL 104  < > 107 108 108  --  107 109  CO2 22  < > 21* 20* 20*  --  19* 18*  GLUCOSE 201*  < > 176* 217* 229* 231* 201* 164*  BUN 83*  < > 80* 78* 81*  --  83* 79*  CREATININE 2.02*  < > 1.88* 1.87* 1.94*  --  2.08* 1.95*  CALCIUM 7.6*  < > 7.3* 7.5* 7.5*  --  7.6* 7.8*  MG 1.9  --   --   --  1.8  --  2.0 2.0  PHOS  --   --   --   --   --   --   --  4.7*  < > = values in this interval not displayed.  Liver Function Tests:  Recent Labs Lab 05/04/17 0355  AST 21  ALT 11*  ALKPHOS 56  BILITOT 0.7  PROT 4.2*  ALBUMIN 1.7*   No results for input(s): LIPASE, AMYLASE in  the last 168 hours. No results for input(s): AMMONIA in the last 168 hours.  CBC:  Recent Labs Lab 05/02/17 1535  04/27/2017 0332 04/23/2017 1414 05/07/2017 2104 05/03/2017 0402 04/30/2017 1453 05/07/17 0448 05/08/17 0339  WBC 8.9  < > 12.3* 13.9* 17.4* 17.5*  --  16.8* 13.2*  NEUTROABS 7.2  --  10.5*  --   --   --   --   --   --   HGB 7.3*  < > 6.5* 7.4* 8.5* 7.5* 8.2* 9.3* 8.2*  HCT 21.7*  < > 19.1* 21.9* 24.9* 22.4* 24.0* 26.7* 24.7*  MCV 87.1  < > 88.8 87.6 86.5 86.8  --  85.9 87.9  PLT 213  < > 160 162 166 178  --  197 185  < > = values in this interval not displayed.  INR:  Recent Labs Lab 04/24/2017 0332 05/09/2017 0402 05/17/2017 1130 05/07/17 0448 05/08/17 0339  INR 3.22 2.20 2.17 1.74 2.24    Other results:     Imaging   Dg Chest Port 1 View  Result Date: 05/07/2017 CLINICAL DATA:  LVAD placement EXAM: PORTABLE CHEST 1 VIEW COMPARISON:  05/02/2017 FINDINGS: Endotracheal tube has been retracted. The tip is 10 mm from the carina. NG tube has been placed with its tip in the fundus of the stomach. The stomach is decompressed. Right upper extremity PICC is stable. The heart remains mildly enlarged. LVAD device is stable at the cardiac apex. Left subclavian AICD device and lead are stable. There is persistent consolidation at the left lung base. Lungs are under aerated with minimal atelectasis at the right lung base. There is a linear interface at the left apex and a 5% left apical pneumothorax is suspected. IMPRESSION: Endotracheal tube as been retracted and the tip is now 10 mm from the carina. 5% left apical pneumothorax is suspected. NG tube placed into the stomach. Stable left basilar consolidation and minimal atelectasis at the right base. Electronically Signed   By: Marybelle Killings M.D.   On: 05/07/2017 08:01   Dg Chest Port 1 View  Result Date: 04/29/2017 CLINICAL DATA:  Check endotracheal tube placement EXAM: PORTABLE CHEST 1 VIEW COMPARISON:  05/04/2017 FINDINGS: Cardiac  shadow remains enlarged. Endotracheal tube is noted  at the level of the carina directed towards right mainstem bronchus. This should be withdrawn and approximately 2 cm. Right-sided PICC line is noted in the mid superior vena cava. Defibrillator lead and left ventricular assist device are again noted and stable. No enteric catheter is identified. Clinical correlation is recommended. IMPRESSION: Endotracheal tube at the carina directed towards right mainstem bronchus. Should be withdrawn 2 cm. Tubes and lines as described.  No anterior catheter is seen. These results will be called to the ordering clinician or representative by the Radiologist Assistant, and communication documented in the PACS or zVision Dashboard. Electronically Signed   By: Inez Catalina M.D.   On: 05/08/2017 18:19   Dg Abd Portable 1v  Result Date: 04/22/2017 CLINICAL DATA:  Status post enteric catheter placement EXAM: PORTABLE ABDOMEN - 1 VIEW COMPARISON:  Film from earlier in the same day FINDINGS: The catheter has been advanced and now lies coiled within the stomach. The remainder of the study is stable. IMPRESSION: Enteric catheter within the stomach. Electronically Signed   By: Inez Catalina M.D.   On: 05/03/2017 20:28   Dg Abd Portable 1v  Result Date: 04/25/2017 CLINICAL DATA:  Status post enteric catheter placement EXAM: PORTABLE ABDOMEN - 1 VIEW COMPARISON:  Film from earlier in the same day FINDINGS: A new enteric catheter is noted with the catheter tip extending into the stomach. The proximal side port however lies in the distal esophagus. Remainder the exam is stable. IMPRESSION: Enteric catheter as described. Electronically Signed   By: Inez Catalina M.D.   On: 04/29/2017 20:27   Dg Abd Portable 1v  Result Date: 04/22/2017 CLINICAL DATA:  Check nasogastric catheter placement EXAM: PORTABLE ABDOMEN - 1 VIEW COMPARISON:  04/27/2016 FINDINGS: Scattered large and small bowel gas is noted. Left ventricular assist device and a  defibrillator wire are again seen. A central line is noted at the cavoatrial junction. No enteric catheter is seen. IMPRESSION: No enteric catheter is noted. Electronically Signed   By: Inez Catalina M.D.   On: 05/07/2017 20:27     Medications:     Scheduled Medications: . chlorhexidine gluconate (MEDLINE KIT)  15 mL Mouth Rinse BID  . Chlorhexidine Gluconate Cloth  6 each Topical Daily  . febuxostat  40 mg Oral Daily  . feeding supplement  1 Container Oral BID BM  . feeding supplement (ENSURE ENLIVE)  237 mL Oral Q24H  . fentaNYL (SUBLIMAZE) injection  50 mcg Intravenous Once  . insulin aspart  0-15 Units Subcutaneous TID WC  . mouth rinse  15 mL Mouth Rinse QID  . metoCLOPramide  10 mg Oral TID AC  . pantoprazole (PROTONIX) IV  40 mg Intravenous Q12H  . sodium chloride flush  10-40 mL Intracatheter Q12H  . sodium chloride flush  3 mL Intravenous Q12H  . Warfarin - Pharmacist Dosing Inpatient   Does not apply q1800    Infusions: . sodium chloride Stopped (04/19/17 1200)  . sodium chloride Stopped (04/21/17 1500)  . sodium chloride Stopped (05/02/17 0630)  . sodium chloride 5 mL/hr at 05/07/17 1600  . sodium chloride 75 mL/hr at 05/08/17 0600  . amiodarone 30 mg/hr (05/08/17 0600)  . fentaNYL infusion INTRAVENOUS 100 mcg/hr (05/07/17 1600)  . lactated ringers Stopped (05/01/17 0810)  . lidocaine 2 mg/min (05/08/17 0600)  . milrinone Stopped (04/21/2017 2238)  . norepinephrine (LEVOPHED) Adult infusion Stopped (05/07/17 1700)  . phenylephrine (NEO-SYNEPHRINE) Adult infusion Stopped (05/02/17 2000)    PRN Medications: albuterol, docusate, fentaNYL, hydrALAZINE,  midazolam, midazolam, neomycin-bacitracin-polymyxin, ondansetron (ZOFRAN) IV, oxyCODONE, phenol, sodium chloride flush, sodium chloride flush, traMADol   Patient Profile   56 yo with CAD s/p CABG, ischemic cardiomyopathy/chronic systolic CHF, tophaceous gout, and CKD stage 3 was admitted for diuresis and consideration  for LVAD placement. S/p HVAD on 6/28  Assessment/Plan:    1. Acute/chronic systolic CHF s/p HVAD: Ischemic cardiomyopathy.  St Jude ICD.  Echo (6/18) with EF 15%, mildly dilated RV with moderately decreased systolic function.  s/p HVAD placement 6/28 and had to return to OR to evacuate mediastinal hematoma.  He had been weaned off pressors/milrinone, but developed ileus w/ likely aspiration event 7/4, re-intubated, developed afib/RVR requiring norepinephrine. Extubated 7/6. Has LE edema but CVP remains low.  Diuretics on hold due to recent suction events/VT and low CVP. VAD speed turned down to 2660 with VT. VAD parameters currently look good.  Milrinone stopped 7/17 early am with recurrent VT.  7/17 norepi started with intubation/EGD.  Today, CVP 4. Now off norepi. CO-OX 70%.  - No diuretic today.  - Later today restart hydralazine/imdur if swallow evaluation ok.  - Off ASA. Back on warfarin, INR 2.24.   - No dig/spironolactone/arb with elevated creatinine.    2. AKI on CKD stage 3: Suspect a component of peri-op ATN, worsened with ileus, vomiting, intubation/sedation.  Creatinine 1.95, stable.  Watch closely.  Keep Map >70 and <85 3. Symptomatic anemia due to acute UGI bleeding:  Received 3 units PRBCs 7/16 and 3 units PRBCs on 05/15/2017.  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  Requiring 2 clips + epi + APC. Does not appear to be bleeding actively now. Hgb 8.2.  - Continue IV Protonix - Back on  warfarin, aiming for INR 2-2.5 range.  Will leave off aspirin for now.  4. Ventricular tachycardia: Status post ventricular tachycardia 2 on 7/13. Possible suction event. VAD speed turned down to 2700.  Recurrent VT 7/17. EP called to bedside. Overdrive pacing successful for about 10 minutes but VT recurred.  Milrinone turned off, remained in VT overnight but hemodynamically stable.  Speed decreased to 2660.  On 7/`8, we were able to pace him out of VT.  Amiodarone gtt  continues and lidocaine added.  - Continue amiodarone gtt.  - Cut back lidocaine to 1 mg per hour. Start mexiletine and stop lidocaine later today if he is able to swallow. Check lidocaine level at 1200.   5. CAD: s/p CABG 2012. No S/S ischemia.  Off aspirin due to GI bleeding. 6. Gout: Severe tophaceous gout. No complaint of gouty pain today.  - Continue uloric. Holding colchicine with AKI.     7. Atrial fibrillation: Developed atrial fibrillation with RVR in setting of aspiration PNA on 7/4.  Now maintaining NSR.  Continue IV amio.   8. Malnutrition: Now off TPN. Speech to follow up today with FEES.  If passes, start regular diet and supplement with ensure.  If not, needs feeding tube.   9. Aspiration PNA with acute hypoxemic respiratory failure on 7/4: He was extubated on 7/6. Tracheal aspirate with Klebsiella and citrobacter. Both sensitive to Zosyn.  - Completed abx 7/13  10. Ileus: Resolved. Getting Reglan.  11. Deconditioning:  Resume PT today.   12. Acute Respiratory Failure: Intubated 7/17 in setting of complex GI intervention. CCM managing. - Extubated 7/18. On 4 liters sats stable.    I reviewed the HVAD parameters from today, and compared the results to the patient's prior  recorded data.  No programming changes were made.  The HVAD is functioning within specified parameters.     Darrick Grinder, NP 05/08/2017, 7:23 AM  VAD Team --- VAD ISSUES ONLY--- Pager [phone] (7am - 7am) Advanced Heart Failure Team  Pager [phone] (M-F; 7a - 4p)  Please contact [facility] for night-coverage after hours (4p -7a ) and weekends on [email or web address]  Patient seen with NP, agree with the above note.  He looks better today.  Creatinine stable.  MAP low 80s.  Off norepinephrine.  No VT on lidocaine + amiodarone.  CVP 4, co-ox 70%.  - Decrease lidocaine to 1 today.  If he passes swallow evaluation by FEES later today, will transition to mexiletine.  Check level if remains on lidocaine.  - Continue current  speed, no events.  - Can resume hydralazine/nitrates if MAP > 85.  - No Lasix today, he continues on NS 75 cc/hr while NPO.  - No overt bleeding today. Will stay off ASA.  Goal INR will be 2-2.5.    Needs FEES today, will liberalize diet if passes (regular diet, whole milk, Ensure/Boost).    Needs to resume PT today.   Loralie Champagne 05/08/2017 7:45 AM

## 2017-05-08 NOTE — Progress Notes (Signed)
Patient ID: James [name], male   DOB: Oct 05, 1961, 56 y.o.   MRN: [id number] HVAD Rounding Note  Subjective:    He has been hemodynamically stable off milrinone with Co-ox 70%. No further evidence of GI bleeding  Evaluated by Speech Therapy today and taking full liquid diet.  He developed confusion this afternoon of unclear etiology. CT head did not show any acute findings. Lidocaine stopped in case that was contributing. I suspect it is ICU delirium from his long complicated course, sleep deprivation and anxiety about what is going to happen next. He seems better now to me and able to have a conversation.   LVAD INTERROGATION:  HVAD:  Flow 4.4 liters/min, speed 2660, power 4   Objective:    Vital Signs:   Temp:  [97.6 F (36.4 C)-98.4 F (36.9 C)] 98.3 F (36.8 C) (07/19 1500) Pulse Rate:  [58-93] 71 (07/19 1300) Resp:  [11-21] 21 (07/19 1300) SpO2:  [87 %-100 %] 100 % (07/19 1300) Arterial Line BP: (95-120)/(65-86) 117/86 (07/19 1300) Weight:  [106.5 kg (234 lb 12.6 oz)] 106.5 kg (234 lb 12.6 oz) (07/19 0500) Last BM Date: 04/26/2017 Mean arterial Pressure 70  Intake/Output:   Intake/Output Summary (Last 24 hours) at 05/08/17 1727 Last data filed at 05/08/17 1300  Gross per 24 hour  Intake           2385.6 ml  Output              790 ml  Net           1595.6 ml     Physical Exam: General:  Looks tired and weak. No resp difficulty HEENT: normal Cor: distant heart sounds with LVAD hum present. Lungs: clear Abdomen: soft, nontender, nondistended. No bruits or masses. Good bowel sounds. Extremities: no edema Neuro: alert & orientedx3, cranial nerves grossly intact. moves all 4 extremities w/o difficulty. Affect pleasant but appears depressed.  Telemetry: sinus 65  Labs: Basic Metabolic Panel:  Recent Labs Lab 05/03/17 0300  05/10/2017 0332 05/02/2017 2104 05/20/2017 0402 04/21/2017 1453 05/07/17 0448 05/08/17 0339  NA 133*  < > 134* 136 136 137 134* 135  K 4.2  < >  3.8 4.0 4.0 3.7 4.1 4.2  CL 104  < > 107 108 108  --  107 109  CO2 22  < > 21* 20* 20*  --  19* 18*  GLUCOSE 201*  < > 176* 217* 229* 231* 201* 164*  BUN 83*  < > 80* 78* 81*  --  83* 79*  CREATININE 2.02*  < > 1.88* 1.87* 1.94*  --  2.08* 1.95*  CALCIUM 7.6*  < > 7.3* 7.5* 7.5*  --  7.6* 7.8*  MG 1.9  --   --   --  1.8  --  2.0 2.0  PHOS  --   --   --   --   --   --   --  4.7*  < > = values in this interval not displayed.  Liver Function Tests:  Recent Labs Lab 05/04/17 0355  AST 21  ALT 11*  ALKPHOS 56  BILITOT 0.7  PROT 4.2*  ALBUMIN 1.7*   No results for input(s): LIPASE, AMYLASE in the last 168 hours. No results for input(s): AMMONIA in the last 168 hours.  CBC:  Recent Labs Lab 05/02/17 1535  05/20/2017 0332 05/02/2017 1414 05/11/2017 2104 05/03/2017 0402 05/04/2017 1453 05/07/17 0448 05/08/17 0339  WBC 8.9  < > 12.3* 13.9* 17.4* 17.5*  --  16.8* 13.2*  NEUTROABS 7.2  --  10.5*  --   --   --   --   --   --   HGB 7.3*  < > 6.5* 7.4* 8.5* 7.5* 8.2* 9.3* 8.2*  HCT 21.7*  < > 19.1* 21.9* 24.9* 22.4* 24.0* 26.7* 24.7*  MCV 87.1  < > 88.8 87.6 86.5 86.8  --  85.9 87.9  PLT 213  < > 160 162 166 178  --  197 185  < > = values in this interval not displayed.  INR:  Recent Labs Lab 04/21/2017 0332 05/01/2017 0402 05/19/2017 1130 05/07/17 0448 05/08/17 0339  INR 3.22 2.20 2.17 1.74 2.24    Other results:  EKG:   Imaging: Ct Head Wo Contrast  Result Date: 05/08/2017 CLINICAL DATA:  Confusion with palilalia EXAM: CT HEAD WITHOUT CONTRAST TECHNIQUE: Contiguous axial images were obtained from the base of the skull through the vertex without intravenous contrast. COMPARISON:  None. FINDINGS: Brain: There is mild diffuse atrophy. There is no intracranial mass, hemorrhage, extra-axial fluid collection, or midline shift. There is patchy small vessel disease in the centra semiovale bilaterally. A small focus of decreased attenuation in the lateral left cerebellum is felt to  represent a prior small lacunar type infarct. Elsewhere gray-white compartments appear unremarkable. No acute infarct is evident. Vascular: No hyperdense vessels. There is calcification in each carotid siphon region. Skull: Bony calvarium appears intact. Sinuses/Orbits: There is mucosal thickening in multiple ethmoid air cells. There is mucosal thickening in the left maxillary antrum. Other paranasal sinuses are clear. There is rightward deviation of the nasal septum. Orbits appear symmetric bilaterally. Other: Mastoid air cells are clear. There is mild bony overgrowth along the anterior aspect of the left external auditory canal. IMPRESSION: Mild atrophy with patchy periventricular small vessel disease. Prior small infarct lateral left cerebellum. No acute appearing infarct evident. No mass, hemorrhage, or extra-axial fluid collection. Foci of arterial vascular calcification noted. Areas of paranasal sinus disease noted. Benign-appearing bony overgrowth along the anterior left external auditory canal may be. Deviated nasal septum toward the right. Electronically Signed   By: Lowella Grip III M.D.   On: 05/08/2017 15:16   Dg Chest Port 1 View  Result Date: 05/07/2017 CLINICAL DATA:  LVAD placement EXAM: PORTABLE CHEST 1 VIEW COMPARISON:  05/16/2017 FINDINGS: Endotracheal tube has been retracted. The tip is 10 mm from the carina. NG tube has been placed with its tip in the fundus of the stomach. The stomach is decompressed. Right upper extremity PICC is stable. The heart remains mildly enlarged. LVAD device is stable at the cardiac apex. Left subclavian AICD device and lead are stable. There is persistent consolidation at the left lung base. Lungs are under aerated with minimal atelectasis at the right lung base. There is a linear interface at the left apex and a 5% left apical pneumothorax is suspected. IMPRESSION: Endotracheal tube as been retracted and the tip is now 10 mm from the carina. 5% left apical  pneumothorax is suspected. NG tube placed into the stomach. Stable left basilar consolidation and minimal atelectasis at the right base. Electronically Signed   By: Marybelle Killings M.D.   On: 05/07/2017 08:01   Dg Chest Port 1 View  Result Date: 05/01/2017 CLINICAL DATA:  Check endotracheal tube placement EXAM: PORTABLE CHEST 1 VIEW COMPARISON:  05/04/2017 FINDINGS: Cardiac shadow remains enlarged. Endotracheal tube is noted at the level of the carina directed towards right mainstem bronchus. This should be withdrawn and  approximately 2 cm. Right-sided PICC line is noted in the mid superior vena cava. Defibrillator lead and left ventricular assist device are again noted and stable. No enteric catheter is identified. Clinical correlation is recommended. IMPRESSION: Endotracheal tube at the carina directed towards right mainstem bronchus. Should be withdrawn 2 cm. Tubes and lines as described.  No anterior catheter is seen. These results will be called to the ordering clinician or representative by the Radiologist Assistant, and communication documented in the PACS or zVision Dashboard. Electronically Signed   By: Inez Catalina M.D.   On: 05/17/2017 18:19   Dg Abd Portable 1v  Result Date: 05/09/2017 CLINICAL DATA:  Status post enteric catheter placement EXAM: PORTABLE ABDOMEN - 1 VIEW COMPARISON:  Film from earlier in the same day FINDINGS: The catheter has been advanced and now lies coiled within the stomach. The remainder of the study is stable. IMPRESSION: Enteric catheter within the stomach. Electronically Signed   By: Inez Catalina M.D.   On: 05/15/2017 20:28   Dg Abd Portable 1v  Result Date: 05/11/2017 CLINICAL DATA:  Status post enteric catheter placement EXAM: PORTABLE ABDOMEN - 1 VIEW COMPARISON:  Film from earlier in the same day FINDINGS: A new enteric catheter is noted with the catheter tip extending into the stomach. The proximal side port however lies in the distal esophagus. Remainder the exam  is stable. IMPRESSION: Enteric catheter as described. Electronically Signed   By: Inez Catalina M.D.   On: 04/25/2017 20:27   Dg Abd Portable 1v  Result Date: 04/21/2017 CLINICAL DATA:  Check nasogastric catheter placement EXAM: PORTABLE ABDOMEN - 1 VIEW COMPARISON:  04/27/2016 FINDINGS: Scattered large and small bowel gas is noted. Left ventricular assist device and a defibrillator wire are again seen. A central line is noted at the cavoatrial junction. No enteric catheter is seen. IMPRESSION: No enteric catheter is noted. Electronically Signed   By: Inez Catalina M.D.   On: 04/24/2017 20:27      Medications:     Scheduled Medications: . chlorhexidine gluconate (MEDLINE KIT)  15 mL Mouth Rinse BID  . Chlorhexidine Gluconate Cloth  6 each Topical Daily  . febuxostat  40 mg Oral Daily  . feeding supplement  1 Container Oral BID BM  . feeding supplement (ENSURE ENLIVE)  237 mL Oral Q24H  . fentaNYL (SUBLIMAZE) injection  50 mcg Intravenous Once  . hydrALAZINE  25 mg Oral Q8H  . insulin aspart  0-15 Units Subcutaneous TID WC  . mouth rinse  15 mL Mouth Rinse QID  . metoCLOPramide (REGLAN) injection  5 mg Intravenous Q8H  . [START ON 05/09/2017] mexiletine  200 mg Oral Q8H  . pantoprazole (PROTONIX) IV  40 mg Intravenous Q12H  . sodium chloride flush  10-40 mL Intracatheter Q12H  . sodium chloride flush  3 mL Intravenous Q12H  . warfarin  1 mg Oral ONCE-1800  . Warfarin - Pharmacist Dosing Inpatient   Does not apply q1800     Infusions: . sodium chloride Stopped (04/19/17 1200)  . sodium chloride Stopped (04/21/17 1500)  . sodium chloride Stopped (05/02/17 0630)  . sodium chloride 5 mL/hr at 05/07/17 1600  . sodium chloride 75 mL/hr at 05/08/17 0600  . amiodarone 30 mg/hr (05/08/17 0600)  . fentaNYL infusion INTRAVENOUS 100 mcg/hr (05/07/17 1600)  . lactated ringers Stopped (05/01/17 0810)  . milrinone Stopped (05/04/2017 2238)  . norepinephrine (LEVOPHED) Adult infusion Stopped  (05/07/17 1700)  . phenylephrine (NEO-SYNEPHRINE) Adult infusion Stopped (05/02/17 2000)  PRN Medications:  albuterol, docusate, fentaNYL, hydrALAZINE, midazolam, midazolam, neomycin-bacitracin-polymyxin, ondansetron (ZOFRAN) IV, oxyCODONE, phenol, sodium chloride flush, sodium chloride flush, traMADol  POD 21s/p HVAD for ischemic cardiomyopathy with acute on chronic systolic heart failure, EF 15% with moderate RV dysfunction. Returned to OR for bleeding from raw mediastinal tissues. He has been hemodynamically stable overnight off milrinone with low CVP and maintaining sinus on amio and lido. Lidocaine stopped today due to mental status changes.   Stage 3 CKD: creat improved to 1.95, slightly improved from yesterday.  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 Continuing IV protonix and will maintain INR 2-2.5 without ASA.  Acute respiratory failure due to aspiration pneumonia resolved.  Ileus resolved  INR down to 2.24 today after resuming Coumadin 5 mg last night. Pharmacy is managing now and 1 mg ordered tonight.    DM: preop Hgb A1c 5.9. Glucose under good control. ContinueLevemir and SSI.  Gout: observe  Hx of arrhythmias preop on amio and postop sustained VT possibly due to suction event. Stable in sinus on amio. ICD in place but will wait to turn on therapies.   Severe malnutrition with prealbumin <5: Encourage PO intake.Full liquids started today.  Physical therapy.   I reviewed the LVAD parameters from today, and compared the results to the patient's prior recorded data. LVAD equipment check completed andis in good working order. Back-up equipment present.   Length of Stay: [address]  Fernande Boyden [facility] 05/08/2017, 5:27 PM

## 2017-05-08 NOTE — Progress Notes (Signed)
CSW met patient's wife at bedside. Wife shared concerns about increased confusion today with patient. Patient yelled out at [place] which was unusual for patient. Wife appears stressed but states support from team and hopeful the confusion will resolve shortly. CSW provided supportive intervention and will continue to follow for supportive needs. Raquel Sarna, [place], [place]

## 2017-05-08 NOTE — Progress Notes (Signed)
[facility] Progress Note  Chief Complaint:    GI bleeding  Subjective: extubated. Feels okay. No BMs / bleeding per nursing staff.   Objective:  Vital signs in last 24 hours: Temp:  [97.6 F (36.4 C)-98.4 F (36.9 C)] 98.4 F (36.9 C) (07/19 0700) Pulse Rate:  [26-93] 66 (07/19 0900) Resp:  [9-20] 20 (07/19 1000) SpO2:  [87 %-100 %] 100 % (07/19 0900) Arterial Line BP: (84-120)/(65-83) 98/65 (07/19 1000) Weight:  [234 lb 12.6 oz (106.5 kg)] 234 lb 12.6 oz (106.5 kg) (07/19 0500) Last BM Date: 2017-06-04 General:   Alert, black male in NAD getting bed bath EENT:  Normal hearing, non icteric sclera, conjunctive pink.  Heart:  LVAD hum Pulm: Normal respiratory efforts. Abdomen:  Soft, nondistended, nontender. LVAD dressing dry. Normal bowel sounds Neurologic:  Alert and  oriented x4;  grossly normal neurologically. Psych:  Pleasant, cooperative.  Normal mood and affect.   Intake/Output from previous day: 07/18 0701 - 07/19 0700 In: 3347.1 [I.V.:3047.1] Out: 1040 [Urine:1040] Intake/Output this shift: Total I/O In: 121.7 [I.V.:121.7] Out: -   Lab Results:  Recent Labs  2017-06-04 0402 2017-06-04 1453 05/07/17 0448 05/08/17 0339  WBC 17.5*  --  16.8* 13.2*  HGB 7.5* 8.2* 9.3* 8.2*  HCT 22.4* 24.0* 26.7* 24.7*  PLT 178  --  197 185   BMET  Recent Labs  2017-06-04 0402 2017-06-04 1453 05/07/17 0448 05/08/17 0339  NA 136 137 134* 135  K 4.0 3.7 4.1 4.2  CL 108  --  107 109  CO2 20*  --  19* 18*  GLUCOSE 229* 231* 201* 164*  BUN 81*  --  83* 79*  CREATININE 1.94*  --  2.08* 1.95*  CALCIUM 7.5*  --  7.6* 7.8*   PT/INR  Recent Labs  05/07/17 0448 05/08/17 0339  LABPROT 20.6* 25.2*  INR 1.74 2.24     ASSESSMENT / PLAN:   1. GIB on warfarin. 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  Injected with epi, treated with APC and 2 hemoclips. No further bleeding. Hgb fluctuating 8.2-8.3.  -warfarin restarted yesterday,  watch closely for recurrent bleeding.  -for swallowing study and then hopefully diet  2. Acute respiratory failure secondary to asp PNA. Resolved. extubated  3. Multiple medical problems not limited to DM, CKD, AFIB, CHF, LVAD placement late June. Having VT this admission   4. Post-op ileus, resolved.   Active Problems:   Acute on chronic systolic CHF (congestive heart failure) (HCC)   Acute on chronic systolic heart failure, NYHA class 3 (HCC)   Anemia, chronic disease   Colon cancer screening   Palliative care by specialist   DNR (do not resuscitate) discussion   LVAD (left ventricular assist device) present (HCC)   Acute respiratory failure with hypoxia (HCC)   Heart failure (HCC)   Ileus (HCC)   Presence of left ventricular assist device (LVAD) (HCC)   History of gout   Coronary artery disease involving coronary bypass graft of native heart without angina pectoris   Chronic kidney disease   Diabetes mellitus type 2 in nonobese [facility](HCC)   Atrial fibrillation with rapid ventricular response (HCC)   Tachypnea   VT (ventricular tachycardia) (HCC)   Acute blood loss anemia   Blood in stool   Melena   Elevated INR (international normalized ratio) due to prior anticoagulant medication ingestion   Gastrointestinal hemorrhage    LOS: 27 days   Willette ClusterPaula Jakwan Sally ,NP 05/08/2017, 10:17  AM  Pager number [phone]

## 2017-05-08 NOTE — Progress Notes (Signed)
Occupational Therapy Treatment Patient Details Name: James [name] MRN: [id number] DOB: [birth year] Today's Date: 05/08/2017    History of present illness  Pt adm with acute on chronic heart failure and underwent Heartware HVAD implant on 6/28. Returned to OR later that day for evacuation of mediastinal hematoma. On 7/4 developed ileus with likely aspiration and intubated 7/4-7/6.   Pt developed Heme + stools with progressive anemia as well as  James [name] 05/02/17.  He underwent EGD with showed suspected AVM 04/26/2017. He was intubated for procedure and extubated 05/08/17 PMH - gout, DMII, HTN, CKD, CAD S/P CABGx6 2012, chronic systolic heart failure and [employer] ICD.    OT comments  Pt with impaired cognition this date - demonstrates focused to brief periods of sustained attention, decreased initiation, and impaired problem solving.  He requires mod A +2 for bed mobility, max A +2 for sit to stand, and total A+2 for squat pivot transfer to chair, as pt unable to shift weight or take steps today.  OT goals downgraded at this time.  Continue to feel he is appropriate for CIR level therapies once he is medically stable.   Follow Up Recommendations  CIR;Supervision/Assistance - 24 hour    Equipment Recommendations  3 in 1 bedside commode    Recommendations for Other Services Rehab consult    Precautions / Restrictions Precautions Precautions: Sternal Precaution Comments: Heartware HVAD Restrictions Other Position/Activity Restrictions: Sternal precautions       Mobility Bed Mobility Overal bed mobility: Needs Assistance Bed Mobility: Supine to Sit   Sidelying to sit: Mod assist;+2 for physical assistance       General bed mobility comments: Pt requires cues to initiate and sequence task as well as assist to move LEs off bed and to lift trunk.  Max A +2 for scooting to EOB   Transfers Overall transfer level: Needs assistance Equipment used: Rolling walker (2 wheeled) (EVA walker ) Transfers:  Sit to/from [employer] Sit to Stand: Max assist;+2 physical assistance;From elevated surface   Squat pivot transfers: Total assist;+2 physical assistance     General transfer comment: Pt required A for forward momentum and to boost hips from bed.  Assist to extend hips, knees and trunk - demonstrates signficant difficulty achieving upright/erect posture.  Attempted standing x 2.  Pt unable to shift weight, or take steps.  Squat pivot to recliner performed - he required assist to lift buttocks, and assist to pivot both LEs, as well as assist to scoot hips in chair     Balance Overall balance assessment: Needs assistance Sitting-balance support: Feet supported Sitting balance-Leahy Scale: Fair Sitting balance - Comments: Pt requires close min guard assist for static EOB sitting    Standing balance support: Bilateral upper extremity supported Standing balance-Leahy Scale: Poor Standing balance comment: Pt stood x 2.  He was able to maintain static standing with bil. UE support with mod A +2 for brief period before fatiguing and requiring max A +2                           ADL either performed or assessed with clinical judgement   ADL Overall ADL's : Needs assistance/impaired Eating/Feeding: Set up Eating/Feeding Details (indicate cue type and reason): to drink from cup  Grooming: Wash/dry hands;Wash/dry face;Oral care;Moderate assistance;Sitting Grooming Details (indicate cue type and reason): assist for thoroughness  Toilet Transfer: Total assistance;+2 for physical assistance;BSC           Functional mobility during ADLs: Maximal assistance;Total assistance;+2 for physical assistance       Vision       Perception     Praxis      Cognition Arousal/Alertness: Awake/alert Behavior During Therapy: Flat affect Overall Cognitive Status: Impaired/Different from baseline Area of Impairment: Attention;Memory;Following  commands;Safety/judgement;Awareness;Problem solving                   Current Attention Level: Focused;Sustained Memory: Decreased short-term memory;Decreased recall of precautions Following Commands: Follows one step commands consistently;Follows one step commands with increased time (requires min - mod cues intermittently ) Safety/Judgement: Decreased awareness of safety   Problem Solving: Slow processing;Decreased initiation;Difficulty sequencing;Requires verbal cues;Requires tactile cues General Comments: Pt very slow to process information, difficulty executing simple commands - will do so, with increased time.   Attention varied between focused with periods of brief sustained attention         Exercises     Shoulder Instructions       General Comments      Pertinent Vitals/ Pain       Pain Assessment: Faces Faces Pain Scale: Hurts a little bit Pain Location: generalized.  Pt endorses hand pain due to gout  Pain Descriptors / Indicators: Grimacing;Sore Pain Intervention(s): Monitored during session;Repositioned  Home Living                                          Prior Functioning/Environment              Frequency  Min 3X/week        Progress Toward Goals  OT Goals(current goals can now be found in the care plan section)  Progress towards OT goals: Not progressing toward goals - comment (medical issues )  Acute Rehab OT Goals Patient Stated Goal: to go home OT Goal Formulation: With patient Time For Goal Achievement: 05/22/17 Potential to Achieve Goals: Good ADL Goals Pt Will Perform Grooming: with min guard assist;standing Pt Will Perform Upper Body Bathing: with min assist;sitting Pt Will Perform Lower Body Bathing: with mod assist;sit to/from stand Pt Will Perform Upper Body Dressing: with mod assist;sitting Pt Will Perform Lower Body Dressing: with mod assist;sit to/from stand Pt Will Transfer to Toilet: with mod  assist;ambulating;regular height toilet;bedside commode;grab bars Pt Will Perform Toileting - Clothing Manipulation and hygiene: with mod assist;sit to/from stand Additional ADL Goal #1: Pt will require min A to manage VAD equipment during ADL activities   Plan Discharge plan remains appropriate    Co-evaluation    PT/OT/SLP Co-Evaluation/Treatment: Yes Reason for Co-Treatment: Complexity of the patient's impairments (multi-system involvement);To address functional/ADL transfers;For patient/therapist safety   OT goals addressed during session: ADL's and self-care      AM-PAC PT "6 Clicks" Daily Activity     Outcome Measure   Help from another person eating meals?: A Little Help from another person taking care of personal grooming?: A Lot Help from another person toileting, which includes using toliet, bedpan, or urinal?: Total Help from another person bathing (including washing, rinsing, drying)?: A Lot Help from another person to put on and taking off regular upper body clothing?: Total Help from another person to put on and taking off regular lower body clothing?: Total 6 Click Score: 10    End of  Session Equipment Utilized During Treatment: Oxygen;Rolling walker  OT Visit Diagnosis: Muscle weakness (generalized) (M62.81)   Activity Tolerance Patient limited by fatigue;Other (comment) (confusion and weakness )   Patient Left in chair;with call bell/phone within reach;with family/visitor present   Nurse Communication Mobility status;Need for lift equipment;Other (comment) (cognitive status )        Time: 1610-9604 OT Time Calculation (min): 51 min  Charges: OT General Charges $OT Visit: 1 Procedure OT Treatments $Therapeutic Activity: 8-22 mins  [employer], OTR/L [phone]    Jeani Hawking M 05/08/2017, 1:06 PM

## 2017-05-08 NOTE — Progress Notes (Signed)
ANTICOAGULATION CONSULT NOTE - Follow Up Consult  Pharmacy Consult for Warfarin  Indication: HVAD  No Active Allergies  Patient Measurements: Height: 6' (182.9 cm) Weight: 234 lb 12.6 oz (106.5 kg) IBW/kg (Calculated) : 77.6  Vital Signs: Temp: 98.4 F (36.9 C) (07/19 0700) Temp Source: Oral (07/19 0700) Pulse Rate: 66 (07/19 0900)  Labs:  Recent Labs  05/19/2017 0402 05/10/2017 1130 04/27/2017 1453 05/07/17 0448 05/08/17 0339  HGB 7.5*  --  8.2* 9.3* 8.2*  HCT 22.4*  --  24.0* 26.7* 24.7*  PLT 178  --   --  197 185  LABPROT 24.8* 24.5*  --  20.6* 25.2*  INR 2.20 2.17  --  1.74 2.24  CREATININE 1.94*  --   --  2.08* 1.95*    Estimated Creatinine Clearance: 54.6 mL/min (A) (by C-G formula based on SCr of 1.95 mg/dL (H)).   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 He developed an ileus on 7/5, warfarin was held, and heparin bridge started once INR fell below 1.8.  Heparin stopped 7/10 with INR 2.19. On 7/13, Hgb dropped and he had melena - warfarin held again and pharmacy asked to start heparin once INR < 1.8 pending GI workup.   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   Warfarin resumed yesterday without heparin bridge. INR 1.74 > 2.24 after one dose of 5mg  (previously hadn't had any warfarin in 5 days). Hgb stable 9.3 s/p multiple transfusions. Continues on IV amiodarone. Remains NPO with FEES pending today. Will be very conservative with warfarin dosing for now.  Goal of Therapy:  INR 2-2.5 Monitor platelets by anticoagulation protocol: Yes   Plan:  1) Warfarin 1mg  tonight 2) Daily INR  Louie CasaJennifer Tore Carreker, PharmD, BCPS 05/08/2017 11:08 AM

## 2017-05-09 LAB — CBC
HEMATOCRIT: 22.8 % — AB (ref 39.0–52.0)
HEMOGLOBIN: 7.5 g/dL — AB (ref 13.0–17.0)
MCH: 29.3 pg (ref 26.0–34.0)
MCHC: 32.9 g/dL (ref 30.0–36.0)
MCV: 89.1 fL (ref 78.0–100.0)
Platelets: 204 10*3/uL (ref 150–400)
RBC: 2.56 MIL/uL — ABNORMAL LOW (ref 4.22–5.81)
RDW: 19.3 % — AB (ref 11.5–15.5)
WBC: 9.1 10*3/uL (ref 4.0–10.5)

## 2017-05-09 LAB — GLUCOSE, CAPILLARY
GLUCOSE-CAPILLARY: 155 mg/dL — AB (ref 65–99)
GLUCOSE-CAPILLARY: 123 mg/dL — AB (ref 65–99)
GLUCOSE-CAPILLARY: 115 mg/dL — AB (ref 65–99)
Glucose-Capillary: 171 mg/dL — ABNORMAL HIGH (ref 65–99)

## 2017-05-09 LAB — BASIC METABOLIC PANEL
ANION GAP: 9 (ref 5–15)
BUN: 72 mg/dL — ABNORMAL HIGH (ref 6–20)
CALCIUM: 7.6 mg/dL — AB (ref 8.9–10.3)
CHLORIDE: 109 mmol/L (ref 101–111)
CO2: 19 mmol/L — AB (ref 22–32)
Creatinine, Ser: 1.89 mg/dL — ABNORMAL HIGH (ref 0.61–1.24)
GFR calc Af Amer: 45 mL/min — ABNORMAL LOW (ref >60)
GFR calc non Af Amer: 39 mL/min — ABNORMAL LOW (ref >60)
GLUCOSE: 165 mg/dL — AB (ref 65–99)
Potassium: 4.3 mmol/L (ref 3.5–5.1)
Sodium: 137 mmol/L (ref 135–145)

## 2017-05-09 LAB — LACTATE DEHYDROGENASE: LDH: 188 U/L (ref 98–192)

## 2017-05-09 LAB — PROTIME-INR
INR: 2.68
PROTHROMBIN TIME: 29.1 s — AB (ref 11.4–15.2)

## 2017-05-09 LAB — PREPARE RBC (CROSSMATCH)

## 2017-05-09 LAB — AMMONIA: Ammonia: 34 umol/L (ref 9–35)

## 2017-05-09 LAB — LIDOCAINE LEVEL: LIDOCAINE LVL: 12.4 ug/mL — AB (ref 1.5–5.0)

## 2017-05-09 LAB — COOXEMETRY PANEL
CARBOXYHEMOGLOBIN: 1.8 % — AB (ref 0.5–1.5)
Methemoglobin: 1 % (ref 0.0–1.5)
O2 Saturation: 82.8 %
Total hemoglobin: 6.8 g/dL — CL (ref 12.0–16.0)

## 2017-05-09 MED ORDER — COLCHICINE 0.6 MG PO TABS: 0.6000 mg | ORAL_TABLET | Freq: Every day | ORAL | Status: DC

## 2017-05-09 MED ORDER — SODIUM CHLORIDE 0.9 % IV SOLN
Freq: Once | INTRAVENOUS | Status: AC
  Administered 2017-05-09: 06:00:00 via INTRAVENOUS

## 2017-05-09 MED ORDER — COLCHICINE 0.6 MG PO TABS
0.6000 mg | ORAL_TABLET | Freq: Two times a day (BID) | ORAL | Status: DC
  Administered 2017-05-09: 0.6 mg via ORAL
  Filled 2017-05-09: qty 1

## 2017-05-09 MED ORDER — ORAL CARE MOUTH RINSE
15.0000 mL | Freq: Two times a day (BID) | OROMUCOSAL | Status: DC
  Administered 2017-05-09 – 2017-05-12 (×7): 15 mL via OROMUCOSAL

## 2017-05-09 NOTE — Progress Notes (Signed)
ANTICOAGULATION CONSULT NOTE - Follow Up Consult  Pharmacy Consult for Warfarin  Indication: HVAD  No Active Allergies  Patient Measurements: Height: 6' (182.9 cm) Weight: 233 lb 14.5 oz (106.1 kg) IBW/kg (Calculated) : 77.6  Vital Signs: Temp: 98.1 F (36.7 C) (07/20 1100) Temp Source: Oral (07/20 1100) Pulse Rate: 83 (07/20 0930)  Labs:  Recent Labs  05/07/17 0448 05/08/17 0339 05/09/17 0415  HGB 9.3* 8.2* 7.5*  HCT 26.7* 24.7* 22.8*  PLT 197 185 204  LABPROT 20.6* 25.2* 29.1*  INR 1.74 2.24 2.68  CREATININE 2.08* 1.95* 1.89*    Estimated Creatinine Clearance: 56.2 mL/min (A) (by C-G formula based on SCr of 1.89 mg/dL (H)).   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 He developed an ileus on 7/5, warfarin was held, and heparin bridge started once INR fell below 1.8.  Heparin stopped 7/10 with INR 2.19. On 7/13, Hgb dropped and he had melena - warfarin held again and pharmacy asked to start heparin once INR < 1.8 pending GI workup.   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   Warfarin resumed 7/18 without heparin bridge. INR with fast trend up after 2 doses, now slightly above goal at 2.68. Hgb down to 7.5 - to get 1 unit today. Continues on IV amiodarone. Diet advanced to clear liquids. Will continue to be conservative with warfarin and hold dose tonight.  Goal of Therapy:  INR 2-2.5 Monitor platelets by anticoagulation protocol: Yes   Plan:  1) No warfarin tonight 2) Daily INR  Louie CasaJennifer Tyjon Bowen, PharmD, BCPS 05/09/2017 11:47 AM

## 2017-05-09 NOTE — Progress Notes (Signed)
LVAD Inpatient Coordinator Rounding Note:  Admitted 04/16/2017 due to A/C Heart failure.   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 Laneta SimmersBartle as DT VAD.  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 He had epinephrine, APC, and 2 clips.   Vital signs: HR: 57 Arterial line: 92/65 (72) O2 Sat: 100% on 4L/[place] Wt:207>209 > 213 >213 > 208>203>220>227lbs>226>225>224>224>225>223>225>230>234<>233   LVAD interrogation reveals:  Speed: 2660 Flow: 4.6 Power: 3.9 Alarms:none Peak: 7.2 Trough: 2.8 HCT: 23 Low flow alarm setting: 2.5 High watt alarm setting: 6.5  Suction: on  Lavare cycle: on  Blood Products: 6/28> 5 PRBC's, 6 FFP 7/1> 1 PRBC 7/9> 1 PRBC 7/13>3 PRBC 7/14>3 PRBC 7/15> 1 PRBC 7/16>3PRBC 7/17> 3 PRBC 7/20> 1 PRBC  Gtts: Milrinone restarted 7/8, stopped 7/16 Levo stopped 04/26/17 restarted 2016/11/22 - stopped 05/07/17 Amiodarone stopped 04/29/17, restarted 7/17 continuous at 30 mg/hr Heparin stopped 04/29/17 Lidocaine 05/07/17 - stopped 7/19  TPN - started 04/24/17 for nutritional support, stopped 04/30/17  Arrhythmia: 04/24/17 - Afib with RVR - started amiodarone, now PO 7/17- wide complex tachycardia- Amio bolus x3 and gtt at 60 mg/hr  Respiratory: 04/23/17 - re-intubated due to respiratory failure secondary to suspected aspiration pneumonia 04/25/17-extubated 2016/11/22- intubated for EGD 2016/11/22- extubated  Drive Line: Daily Dressing Kits with Aquacell AG silver strips per protocol.   Labs:  LDH trend:160 (pre VAD)>227>215>191>212>207>222>217>219>163>207>217>200>183>161>164>184>195>190>188  INR trend: 1.31>1.45>1.44>1.25>1.33>1.68>2.61>2.4>1.81>2.19>2.09>2.28>3.58>3.94>3.22>2.17>1.74>1.742.24>2.68  Anticoagulation Plan: -INR Goal: 2-2.5  -ASA Dose: 325 mg- on hold  Adverse Events on VAD: -Return to OR 6/28 with high chest tube output, evacuation of mediastinal hematoma -Ileus with  vomiting on 04/23/17; re-intubation for acute respiratory failure; probable aspiration pneumonia -7/13-GI bleed   Plan/Recommendations:   1. Continue daily drive line dressing changes with patient wife.  2. Please call VAD pager with patient and equipment concerns.  Marcellus ScottLesley Wilson RN, VAD Coordinator 24/7 pager [phone]

## 2017-05-09 NOTE — Progress Notes (Signed)
Presented to patients room today for drive line exit wound care with his wife, Elease HashimotoMarcie. Existing VAD dressing removed and site care performed using sterile technique. Drive line exit site cleaned with Chlora prep applicators x 2, allowed to dry, and gauze dressing with silver strip re-applied. Exit site healed and incorporated, the velour is fully implanted at exit site. No redness, tenderness, drainage, foul odor or rash noted. Drive line anchor re-applied. Pt denies fever or chills.   Upon entering room, patients and his wife were practicing connecting HVAD controller to power sources. Reinforced the importance of making sure one power source is fully connected prior to changing the second power source.   Will discuss with RD about "magic cups" for nutritional support for patient.   Marcellus ScottLesley Javyon Fontan RN, VAD Coordinator 24/7 pager [phone]

## 2017-05-09 NOTE — Progress Notes (Signed)
Physical Therapy Treatment Patient Details Name: James FabianHarry D Samad MRN: [id number] DOB: 26-Mar-1961 Today's Date: 05/09/2017    History of Present Illness  Pt adm with acute on chronic heart failure and underwent Heartware HVAD implant on 6/28. Returned to OR later that day for evacuation of mediastinal hematoma. On 7/4 developed ileus with likely aspiration and intubated 7/4-7/6.   Pt developed Heme + stools with progressive anemia as well as  Kirby FunkVtach 05/02/17.  He underwent EGD with showed suspected AVM 05/08/2017. He was intubated for procedure and extubated 05/08/17 PMH - gout, DMII, HTN, CKD, CAD S/P CABGx6 2012, chronic systolic heart failure and St jude ICD.     PT Comments    Pt admitted with above diagnosis. Pt currently with functional limitations due to balance and endurance deficits. Pt was able to ambulate with RW with min assist of 2 persons with big improvement from yesterday. Wife followed with chair but pt was able to make 170 feet with 2 standing rest breaks.  Pt in great spirits as well with wife performing equipment change from power to battery and back to power at end of treatment.  Pt will benefit from skilled PT to increase their independence and safety with mobility to allow discharge to the venue listed below.     Follow Up Recommendations  CIR;Supervision for mobility/OOB     Equipment Recommendations  Other (comment) (TBD)    Recommendations for Other Services Rehab consult     Precautions / Restrictions Precautions Precautions: Sternal Precaution Comments: Heartware HVAD    Mobility  Bed Mobility Overal bed mobility: Needs Assistance Bed Mobility: Supine to Sit Rolling: Min assist Sidelying to sit: Mod assist;+2 for physical assistance       General bed mobility comments: Pt requires cues to initiate and sequence task as well as assist to move LEs off bed and to lift trunk.  min A for scooting to EOB. Much better today   Transfers Overall transfer level: Needs  assistance Equipment used: Rolling walker (2 wheeled) Transfers: Sit to/from [employer] CorporationStand;Squat Pivot Transfers Sit to Stand: +2 physical assistance;From elevated surface;Min assist;Mod assist         General transfer comment: Pt needed some assist to power up to RW but once up just min assist for stability.   Ambulation/Gait Ambulation/Gait assistance: Min assist;+2 physical assistance Ambulation Distance (Feet): 175 Feet Assistive device: Rolling walker (2 wheeled) Gait Pattern/deviations: Step-through pattern;Decreased stride length;Trunk flexed;Wide base of support;Drifts right/left Gait velocity: decreased Gait velocity interpretation: Below normal speed for age/gender General Gait Details: Pt able to ambulate with RW with cues to stand tall.  Only needed 2 standing rest breaks; 1-2/4 DOE with ambulation; VSS; SpO2 WNL with pt on 3LO2.    Stairs            Wheelchair Mobility    Modified Rankin (Stroke Patients Only)       Balance Overall balance assessment: Needs assistance Sitting-balance support: Feet supported;No upper extremity supported Sitting balance-Leahy Scale: Fair Sitting balance - Comments: Pt requires min guard assist for static EOB sitting    Standing balance support: Bilateral upper extremity supported Standing balance-Leahy Scale: Poor Standing balance comment: min assist to stand with RW support                            Cognition Arousal/Alertness: Awake/alert Behavior During Therapy: Flat affect Overall Cognitive Status: Impaired/Different from baseline Area of Impairment: Attention;Following commands  Current Attention Level: Focused   Following Commands: Follows one step commands with increased time Safety/Judgement: Decreased awareness of safety   Problem Solving: Requires verbal cues;Requires tactile cues        Exercises General Exercises - Lower Extremity Ankle Circles/Pumps: AROM;10  reps;Supine Long Arc Quad: AROM;Both;10 reps;Seated Hip Flexion/Marching: AROM;Both;10 reps;Seated    General Comments General comments (skin integrity, edema, etc.): VAD parameters stayed stable entire treatment.  Wife performed the VAd equipment change each time for practice.       Pertinent Vitals/Pain Pain Assessment: No/denies pain   VSS  Home Living                      Prior Function            PT Goals (current goals can now be found in the care plan section) Progress towards PT goals: Progressing toward goals    Frequency    Min 3X/week      PT Plan Current plan remains appropriate    Co-evaluation PT/OT/SLP Co-Evaluation/Treatment: Yes Reason for Co-Treatment: Complexity of the patient's impairments (multi-system involvement) PT goals addressed during session: Mobility/safety with mobility        AM-PAC PT "6 Clicks" Daily Activity  Outcome Measure  Difficulty turning over in bed (including adjusting bedclothes, sheets and blankets)?: A Lot Difficulty moving from lying on back to sitting on the side of the bed? : A Lot Difficulty sitting down on and standing up from a chair with arms (e.g., wheelchair, bedside commode, etc,.)?: A Lot Help needed moving to and from a bed to chair (including a wheelchair)?: A Lot Help needed walking in hospital room?: A Lot Help needed climbing 3-5 steps with a railing? : Total 6 Click Score: 11    End of Session Equipment Utilized During Treatment: Gait belt;Oxygen Activity Tolerance: Patient limited by fatigue Patient left: with call bell/phone within reach;in chair;with family/visitor present Nurse Communication: Mobility status PT Visit Diagnosis: Unsteadiness on feet (R26.81);Muscle weakness (generalized) (M62.81);Difficulty in walking, not elsewhere classified (R26.2) Pain - part of body:  (Throat)     Time: 1610-9604 PT Time Calculation (min) (ACUTE ONLY): 29 min  Charges:  $Gait Training: 8-22  mins                    G Codes:       Hussam Muniz,PT Acute Rehabilitation [phone] (pager)    Berline Lopes 05/09/2017, 4:21 PM

## 2017-05-09 NOTE — Progress Notes (Signed)
Subjective: Patient is much improved, no further repetition  Exam: Vitals:   05/09/17 0930 05/09/17 1100  BP:    Pulse: 83   Resp: 19   Temp: 98.7 F (37.1 C) 98.1 F (36.7 C)   Gen: In bed, NAD Resp: non-labored breathing, no acute distress Abd: soft, nt  Neuro: MS: Awake, alert, interactive and appropriate CN: Pupils equal and reactive to light, extra movements intact, visual fields full Motor: Moves all extremities with symmetric strength Sensory: Intact to light touch  Pertinent Labs: Lidocaine-pending  Impression: 56 year old male with altered mental status in the setting of ICU stay. I do think that neuropsychiatric effects of lidocaine are possible, though delirium from other causes could also be a consideration if he has a mild underlying tic disorder(that he does not endorse this). I think stroke is less likely given the resolution of symptoms, and at this point I'm not sure further imaging would change her management.  At this time, no further recommendations, please call if further questions remain.  Recommendations: 1) no further recommendations at this time, please call further questions or concerns.  Ritta SlotMcNeill Jaremy Nosal, MD [facility] [phone]  If 7pm- 7am, please page neurology on call as listed in AMION.

## 2017-05-09 NOTE — Progress Notes (Signed)
  Speech Language Pathology Treatment: Dysphagia  Patient Details Name: James [name] MRN: [id number] DOB: 25-Mar-1961 Today's Date: 05/09/2017 Time: 1040-1055 SLP Time Calculation (min) (ACUTE ONLY): 15 min  Assessment / Plan / Recommendation Clinical Impression  Dysphagia treatment provided for diet tolerance/ review of recommendations and strategies. Pt had a throat clear x1 with sips of thin liquid; no other overt s/s of aspiration noted. Pt conversant at bedside, continues to present with low vocal intensity. Reviewed findings from FEES on 7/19 along with recommended strategies (slow rate/ small bites and sips, multiple swallows, remain upright 30-60 minutes after meal, no straws. Also reviewed recommendation for MBS prior to advancing to solids to visualize the UES/ upper esophagus; plan for this within the next few days. Will continue to follow.   HPI HPI: [age] year old male admitted 03/30/2017 for LVAD placement. PMH significant for gout, DM2, HTN, CKD, CAD s/p CABGx6 (2012), systemic heart failure, [employer] ICD. Pt has had 3 intubations this admission. Extubated this morning.       SLP Plan  Continue with current plan of care       Recommendations  Diet recommendations: Thin liquid Liquids provided via: Cup;No straw Medication Administration: Crushed with puree Supervision: Patient able to self feed;Full supervision/cueing for compensatory strategies Compensations: Slow rate;Small sips/bites;Multiple dry swallows after each bite/sip Postural Changes and/or Swallow Maneuvers: Seated upright 90 degrees;Upright 30-60 min after meal                Oral Care Recommendations: Oral care BID Follow up Recommendations: 24 hour supervision/assistance SLP Visit Diagnosis: Dysphagia, pharyngoesophageal phase (R13.14) Plan: Continue with current plan of care       GO                Metro KungAmy K Pink Maye, MA, CCC-SLP 05/09/2017, 11:02 AM [id number]

## 2017-05-09 NOTE — Progress Notes (Signed)
Patient ID: James [name], male   DOB: 1961-05-09, 56 y.o.   MRN: [id number] \  Advanced Heart Failure VAD Team Note  Subjective:    HVAD placed 6/28.  Returned to the OR that evening with high chest tube output, evacuation of mediastinal hematoma.   Extubated 6/29. Milrinone stopped on 7/4.  Evening of 7/4, patient developed ileus with respiratory compromise. NGT placed with 3 L suctioned out.  CXR with suspicion for aspiration PNA.  Patient had to be intubated.  He went into atrial fibrillation with RVR.  He became hypotensive and was started on norepinephrine and phenylephrine.  Amiodarone gtt begun.     Extubated again on 7/6.   On 7/13 Had 2 sustained episodes of VT. Had to be cardioverted 1. Second episode broke with overdrive pacing with Dr. Caryl Comes. VAD speed turned down to 2700.   GI bleed --> 2 units PRBCs 7/14, 2 units 7/15. 3 U PRBCs 7/16,  04/28/2017 3 UPRBCs.   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  He had epinephrine, APC, and 2 clips. Intubated prior to procedure and placed on norepinephrine. Speed dropped to 2660.   7/18 Back in VT with overdrive pacing. Lidocaine was started in addition to amiodarone. Extubated.  7/19 lidocaine stopped with confusion. Neuro consulted.  EEG normal.  CT of head no acute findings. Plan to check CT of head tomorrow.   INR 2.6. CO-OX 83%. 1 large dark BM. Hgb down to 7.5. Receiving another unit of blood.   Overall feeling better. He slept well last night.  Confusion appears to have resolved.  Complaining of fatigue. Denies SOB.   HVAD INTERROGATION:  HVAD:  Flow 4.4 liters/min, speed 2660,  power 3.8w,  Peak 7.2 Trough 2.8 Suction off. Lavare On. No alarms.    Objective:    Vital Signs:   Temp:  [97.6 F (36.4 C)-98.3 F (36.8 C)] 98 F (36.7 C) (07/20 0647) Pulse Rate:  [25-73] 58 (07/20 0700) Resp:  [18-27] 23 (07/20 0700) SpO2:  [90 %-100 %] 100 % (07/20 0700) Arterial Line BP:  (79-121)/(58-86) 94/65 (07/20 0700) Weight:  [233 lb 14.5 oz (106.1 kg)] 233 lb 14.5 oz (106.1 kg) (07/20 0500) Last BM Date: 05/01/2017 Mean arterial Pressure 60-70s  Intake/Output:   Intake/Output Summary (Last 24 hours) at 05/09/17 0712 Last data filed at 05/09/17 0647  Gross per 24 hour  Intake          2752.33 ml  Output             1785 ml  Net           967.33 ml     Physical Exam  CVP 4. Physical Exam: GENERAL: Appears chronically ill. NAD. In bed  HEENT: normal  NECK: Supple, JVP 5-6 .  2+ bilaterally, no bruits.  No lymphadenopathy or thyromegaly appreciated.  CARDIAC:  Mechanical heart sounds with LVAD hum present.  LUNGS:  Decreased in the bed. On 4 liters oxygen.   ABDOMEN:  Soft, round, nontender, positive bowel sounds x4.     LVAD exit site:   Dressing dry and intact.  No erythema or drainage.  Stabilization device present and accurately applied.  Driveline dressing is being changed daily per sterile technique. EXTREMITIES:  Warm and dry, no cyanosis, clubbing, rash or edema  RUE PICC L radial A line. Gouty nodules on fingers.  NEUROLOGIC:  Alert and oriented x 4.  Gait steady.  No aphasia.  No  dysarthria.  Affect pleasant.  GU: Foley  Yellow urine      Telemetry   NSR 50-60s    Labs   Basic Metabolic Panel:  Recent Labs Lab 05/03/17 0300  05/18/2017 2104 05/08/2017 0402 05/02/2017 1453 05/07/17 0448 05/08/17 0339 05/09/17 0415  NA 133*  < > 136 136 137 134* 135 137  K 4.2  < > 4.0 4.0 3.7 4.1 4.2 4.3  CL 104  < > 108 108  --  107 109 109  CO2 22  < > 20* 20*  --  19* 18* 19*  GLUCOSE 201*  < > 217* 229* 231* 201* 164* 165*  BUN 83*  < > 78* 81*  --  83* 79* 72*  CREATININE 2.02*  < > 1.87* 1.94*  --  2.08* 1.95* 1.89*  CALCIUM 7.6*  < > 7.5* 7.5*  --  7.6* 7.8* 7.6*  MG 1.9  --   --  1.8  --  2.0 2.0  --   PHOS  --   --   --   --   --   --  4.7*  --   < > = values in this interval not displayed.  Liver Function Tests:  Recent Labs Lab  05/04/17 0355  AST 21  ALT 11*  ALKPHOS 56  BILITOT 0.7  PROT 4.2*  ALBUMIN 1.7*   No results for input(s): LIPASE, AMYLASE in the last 168 hours. No results for input(s): AMMONIA in the last 168 hours.  CBC:  Recent Labs Lab 05/02/17 1535  05/03/2017 0332  05/16/2017 2104 04/25/2017 0402 05/01/2017 1453 05/07/17 0448 05/08/17 0339 05/09/17 0415  WBC 8.9  < > 12.3*  < > 17.4* 17.5*  --  16.8* 13.2* 9.1  NEUTROABS 7.2  --  10.5*  --   --   --   --   --   --   --   HGB 7.3*  < > 6.5*  < > 8.5* 7.5* 8.2* 9.3* 8.2* 7.5*  HCT 21.7*  < > 19.1*  < > 24.9* 22.4* 24.0* 26.7* 24.7* 22.8*  MCV 87.1  < > 88.8  < > 86.5 86.8  --  85.9 87.9 89.1  PLT 213  < > 160  < > 166 178  --  197 185 204  < > = values in this interval not displayed.  INR:  Recent Labs Lab 04/26/2017 0402 05/16/2017 1130 05/07/17 0448 05/08/17 0339 05/09/17 0415  INR 2.20 2.17 1.74 2.24 2.68    Other results:     Imaging   Ct Head Wo Contrast  Result Date: 05/08/2017 CLINICAL DATA:  Confusion with palilalia EXAM: CT HEAD WITHOUT CONTRAST TECHNIQUE: Contiguous axial images were obtained from the base of the skull through the vertex without intravenous contrast. COMPARISON:  None. FINDINGS: Brain: There is mild diffuse atrophy. There is no intracranial mass, hemorrhage, extra-axial fluid collection, or midline shift. There is patchy small vessel disease in the centra semiovale bilaterally. A small focus of decreased attenuation in the lateral left cerebellum is felt to represent a prior small lacunar type infarct. Elsewhere gray-white compartments appear unremarkable. No acute infarct is evident. Vascular: No hyperdense vessels. There is calcification in each carotid siphon region. Skull: Bony calvarium appears intact. Sinuses/Orbits: There is mucosal thickening in multiple ethmoid air cells. There is mucosal thickening in the left maxillary antrum. Other paranasal sinuses are clear. There is rightward deviation of the  nasal septum. Orbits appear symmetric bilaterally. Other: Mastoid air cells  are clear. There is mild bony overgrowth along the anterior aspect of the left external auditory canal. IMPRESSION: Mild atrophy with patchy periventricular small vessel disease. Prior small infarct lateral left cerebellum. No acute appearing infarct evident. No mass, hemorrhage, or extra-axial fluid collection. Foci of arterial vascular calcification noted. Areas of paranasal sinus disease noted. Benign-appearing bony overgrowth along the anterior left external auditory canal may be. Deviated nasal septum toward the right. Electronically Signed   By: Lowella Grip III M.D.   On: 05/08/2017 15:16     Medications:     Scheduled Medications: . chlorhexidine gluconate (MEDLINE KIT)  15 mL Mouth Rinse BID  . Chlorhexidine Gluconate Cloth  6 each Topical Daily  . febuxostat  40 mg Oral Daily  . feeding supplement  1 Container Oral BID BM  . feeding supplement (ENSURE ENLIVE)  237 mL Oral Q24H  . fentaNYL (SUBLIMAZE) injection  50 mcg Intravenous Once  . hydrALAZINE  25 mg Oral Q8H  . insulin aspart  0-15 Units Subcutaneous TID WC  . mouth rinse  15 mL Mouth Rinse QID  . metoCLOPramide (REGLAN) injection  5 mg Intravenous Q8H  . mexiletine  200 mg Oral Q8H  . pantoprazole (PROTONIX) IV  40 mg Intravenous Q12H  . sodium chloride flush  10-40 mL Intracatheter Q12H  . sodium chloride flush  3 mL Intravenous Q12H  . Warfarin - Pharmacist Dosing Inpatient   Does not apply q1800    Infusions: . sodium chloride Stopped (04/19/17 1200)  . sodium chloride Stopped (04/21/17 1500)  . sodium chloride Stopped (05/02/17 0630)  . sodium chloride 5 mL/hr at 05/07/17 1600  . sodium chloride 75 mL/hr at 05/09/17 0600  . amiodarone 30 mg/hr (05/09/17 0600)  . fentaNYL infusion INTRAVENOUS 100 mcg/hr (05/07/17 1600)  . lactated ringers Stopped (05/01/17 0810)  . milrinone Stopped (04/23/2017 2238)  . norepinephrine (LEVOPHED)  Adult infusion 2 mcg/min (05/09/17 0600)  . phenylephrine (NEO-SYNEPHRINE) Adult infusion Stopped (05/02/17 2000)    PRN Medications: albuterol, docusate, fentaNYL, hydrALAZINE, midazolam, midazolam, neomycin-bacitracin-polymyxin, ondansetron (ZOFRAN) IV, oxyCODONE, phenol, sodium chloride flush, sodium chloride flush, traMADol   Patient Profile   57 yo with CAD s/p CABG, ischemic cardiomyopathy/chronic systolic CHF, tophaceous gout, and CKD stage 3 was admitted for diuresis and consideration for LVAD placement. S/p HVAD on 6/28  Assessment/Plan:    1. Acute/chronic systolic CHF s/p HVAD: Ischemic cardiomyopathy.  St Jude ICD.  Echo (6/18) with EF 15%, mildly dilated RV with moderately decreased systolic function.  s/p HVAD placement 6/28 and had to return to OR to evacuate mediastinal hematoma.  He had been weaned off pressors/milrinone, but developed ileus w/ likely aspiration event 7/4, re-intubated, developed afib/RVR requiring norepinephrine. Extubated 7/6. Has LE edema but CVP remains low.  Diuretics on hold due to recent suction events/VT and low CVP. VAD speed turned down to 2660 with VT. VAD parameters currently look good.  Milrinone stopped 7/17 early am with recurrent VT.  7/17 norepi started with intubation/EGD.  Todays CVP 4. On norepi 2 mcg. Wean off today. CO-OX 83%.   - No diuretic today.  - Holding hydralazine/imdur with soft MAPs   - Off ASA. Back on warfarin, INR 2.68 => goal now is 2-2.5.   - No dig/spironolactone/arb with elevated creatinine.    - Should be able to titrate off norepinephrine today, MAP around 80.  2. AKI on CKD stage 3: Suspect a component of peri-op ATN, worsened with ileus, vomiting, intubation/sedation. Creatinine 1.89.  Watch  closely.  Keep Map >70 and <85 3. Symptomatic anemia due to acute UGI bleeding:  Received 3 units PRBCs 7/16 and 3 units PRBCs on 05/03/2017.  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  Requiring 2  clips + epi + APC. Does not appear to be bleeding actively now, BM last night appeared to show old blood. HGB down to 7.5. Receiving 1UPBCs. - Continue IV Protonix - Back on  warfarin, aiming for INR 2-2.5 range. INR 2.68, discuss with pharmacy.  Will leave off aspirin for now.  4. Ventricular tachycardia: Status post ventricular tachycardia 2 on 7/13. Possible suction event. VAD speed turned down to 2700.  Recurrent VT 7/17. EP called to bedside. Overdrive pacing successful for about 10 minutes but VT recurred.  Milrinone turned off, remained in VT overnight but hemodynamically stable.  Speed decreased to 2660.  On 7/18, we were able to pace him out of VT.  Amiodarone gtt continues and lidocaine added then stopped due to confusion/mental status changes.  - Continue amiodarone gtt.  - Off lidocaine with confusion. Maintaining NSR. Starting mexiletine today.   5. CAD: s/p CABG 2012. No S/S ischemia.  Off aspirin due to GI bleeding. 6. Gout: Severe tophaceous gout. No complaint of gouty pain today.  - Continue uloric. Holding colchicine with AKI.     7. Atrial fibrillation: Developed atrial fibrillation with RVR in setting of aspiration PNA on 7/4.  Now maintaining NSR.  Continue amio.   8. Malnutrition: Now off TPN. Per Speech- FEEs performed. Continue full liquids. ST to continue to follow.  9. Aspiration PNA with acute hypoxemic respiratory failure on 7/4: He was extubated on 7/6. Tracheal aspirate with Klebsiella and citrobacter. Both sensitive to Zosyn.  - Completed abx 7/13  10. Ileus: Resolved. Getting Reglan.  11. Deconditioning:  Continue PT.    12. Acute Respiratory Failure: Intubated 7/17 in setting of complex GI intervention. CCM managing. - Extubated 7/18. On 4 liters sats stable.  Stable sats.  13. Confusion: Possibly related to lidocaine.  Lidocaine stopped. Confusion resolving. CT of head negative. EEG ok. Plan for repeat CT tomorrow (7/21).   Mobilize and encourage nutrition.  I  reviewed the HVAD parameters from today, and compared the results to the patient's prior recorded data.  No programming changes were made.  The HVAD is functioning within specified parameters.     Darrick Grinder, NP 05/09/2017, 7:12 AM  VAD Team --- VAD ISSUES ONLY--- Pager [phone] (7am - 7am) Advanced Heart Failure Team  Pager [phone] (M-F; 7a - 4p)  Please contact [facility] for night-coverage after hours (4p -7a ) and weekends on [email or web address]  Patient seen with NP, agree with the above note.  I made appropriate changes to note.   He is doing better this morning, much less confused.  Slept well last night.  Delirium may have been a combination of lack of sleep and lidocaine toxicity.    No further VT, remains in NSR.  Will add mexiletine today to regimen, continue amiodarone.   Hemoglobin to 7.5 today.  He had 1 BM that appeared to be old blood.  Will give 1 unit PRBCs today.    CVP 4-8.  Will get unit of blood today.  Weight up overall, will stop IV fluid.   Started on norepinephrine with soft MAP overnight, only on 2, should be able to stop this morning.    Creatinine improved.   Needs much work with PT and push nutrition  today.   Loralie Champagne 05/09/2017 7:45 AM

## 2017-05-09 NOTE — Progress Notes (Signed)
3 Days Post-Op Procedure(s) (LRB): ENTEROSCOPY (N/A) HOT HEMOSTASIS (ARGON PLASMA COAGULATION/BICAP) (N/A) Subjective: Patient feels well sitting up in chair No complaint of pain or abdominal discomfort 1 tarry stools today Ambulated well in the hallway Taking oral diet, Coumadin resumed Regular rhythm on IV amiodarone We'll plan on transitioning to oral amiodarone tomorrow  Objective: Vital signs in last 24 hours: Temp:  [97.6 F (36.4 C)-98.7 F (37.1 C)] 98.1 F (36.7 C) (07/20 1500) Pulse Rate:  [25-83] 54 (07/20 1200) Cardiac Rhythm: Normal sinus rhythm;Sinus bradycardia (07/20 0800) Resp:  [18-27] 21 (07/20 1200) SpO2:  [90 %-100 %] 100 % (07/20 1200) Arterial Line BP: (79-121)/(58-75) 111/75 (07/20 1200) Weight:  [233 lb 14.5 oz (106.1 kg)] 233 lb 14.5 oz (106.1 kg) (07/20 0500)  Hemodynamic parameters for last 24 hours: CVP:  [4 mmHg-8 mmHg] 4 mmHg  Intake/Output from previous day: 07/19 0701 - 07/20 0700 In: 2847.8 [P.O.:580; I.V.:2237.8; Blood:30] Out: 1785 [Urine:1785] Intake/Output this shift: Total I/O In: 898.1 [P.O.:420; I.V.:141.1; Blood:337] Out: 600 [Urine:600]       Exam    General- alert and comfortable, sitting up in chair    Surgical incisions clean and dry   Lungs- clear without rales, wheezes   Cor- regular rate and rhythm, no murmur , gallop   Abdomen- soft, non-tender   Extremities - warm, non-tender, minimal edema   Neuro- oriented, appropriate, no focal weakness   Lab Results:  Recent Labs  05/08/17 0339 05/09/17 0415  WBC 13.2* 9.1  HGB 8.2* 7.5*  HCT 24.7* 22.8*  PLT 185 204   BMET:  Recent Labs  05/08/17 0339 05/09/17 0415  NA 135 137  K 4.2 4.3  CL 109 109  CO2 18* 19*  GLUCOSE 164* 165*  BUN 79* 72*  CREATININE 1.95* 1.89*  CALCIUM 7.8* 7.6*    PT/INR:  Recent Labs  05/09/17 0415  LABPROT 29.1*  INR 2.68   ABG    Component Value Date/Time   PHART 7.406 04/28/2017 0407   HCO3 20.2 04/28/2017 0407   TCO2 22 05/02/2017 1628   ACIDBASEDEF 4.0 (H) 04/28/2017 0407   O2SAT 82.8 05/09/2017 0430   CBG (last 3)   Recent Labs  05/09/17 0734 05/09/17 1112 05/09/17 1521  GLUCAP 155* 171* 123*    Assessment/Plan: S/P Procedure(s) (LRB): ENTEROSCOPY (N/A) HOT HEMOSTASIS (ARGON PLASMA COAGULATION/BICAP) (N/A) Status post LVAD for ischemic cardiomyopathy complicated by postoperative ileus and aspiration pneumonia now resolved, postoperative acute on chronic renal failure now improved, and postoperative GI bleed requiring endoscopy and coagulation and clipping of jejunal AVM. Postoperative confusion related to anemia and medication. Head CT scan negative  Continue to progress diet and resume Coumadin Chest x-ray satisfactory without edema Patient strong enough and mentally alert to resume VAD teaching  LOS: 28 days    James [name] 05/09/2017

## 2017-05-09 NOTE — Progress Notes (Signed)
Paged MD Bartle regarding patients drop in hemoglobin this AM. Patients BP soft over night and had to restart levophed at a low dose. Patient asymptomatic. 1 large BM over night that was dark, but no signs of active bleeding. CVP 4. Will transfuse one unit of packed RBC. Will continue to monitor.  Horton ChinMacKayla A Dallan Schonberg, RN

## 2017-05-10 LAB — BPAM RBC
BLOOD PRODUCT EXPIRATION DATE: 201807242359
BLOOD PRODUCT EXPIRATION DATE: 201808022359
Blood Product Expiration Date: 201808022359
Blood Product Expiration Date: 201808032359
ISSUE DATE / TIME: 201807170844
ISSUE DATE / TIME: 201807171129
ISSUE DATE / TIME: 201807171545
ISSUE DATE / TIME: 201807200618
UNIT TYPE AND RH: 6200
UNIT TYPE AND RH: 6200
UNIT TYPE AND RH: 6200
Unit Type and Rh: 6200

## 2017-05-10 LAB — CBC
HCT: 24.8 % — ABNORMAL LOW (ref 39.0–52.0)
HCT: 26.6 % — ABNORMAL LOW (ref 39.0–52.0)
Hemoglobin: 8.1 g/dL — ABNORMAL LOW (ref 13.0–17.0)
Hemoglobin: 8.6 g/dL — ABNORMAL LOW (ref 13.0–17.0)
MCH: 29.3 pg (ref 26.0–34.0)
MCH: 29.4 pg (ref 26.0–34.0)
MCHC: 32.7 g/dL (ref 30.0–36.0)
MCHC: 32.3 g/dL (ref 30.0–36.0)
MCV: 89.9 fL (ref 78.0–100.0)
MCV: 90.8 fL (ref 78.0–100.0)
PLATELETS: 216 10*3/uL (ref 150–400)
Platelets: 246 10*3/uL (ref 150–400)
RBC: 2.76 MIL/uL — AB (ref 4.22–5.81)
RBC: 2.93 MIL/uL — ABNORMAL LOW (ref 4.22–5.81)
RDW: 19.1 % — AB (ref 11.5–15.5)
RDW: 19.2 % — ABNORMAL HIGH (ref 11.5–15.5)
WBC: 7.1 10*3/uL (ref 4.0–10.5)
WBC: 10.3 10*3/uL (ref 4.0–10.5)

## 2017-05-10 LAB — TYPE AND SCREEN
ABO/RH(D): A POS
Antibody Screen: NEGATIVE
UNIT DIVISION: 0
UNIT DIVISION: 0
UNIT DIVISION: 0
Unit division: 0

## 2017-05-10 LAB — BASIC METABOLIC PANEL
ANION GAP: 5 (ref 5–15)
BUN: 61 mg/dL — AB (ref 6–20)
CALCIUM: 7.5 mg/dL — AB (ref 8.9–10.3)
CO2: 20 mmol/L — ABNORMAL LOW (ref 22–32)
Chloride: 110 mmol/L (ref 101–111)
Creatinine, Ser: 1.64 mg/dL — ABNORMAL HIGH (ref 0.61–1.24)
GFR calc Af Amer: 53 mL/min — ABNORMAL LOW (ref >60)
GFR, EST NON AFRICAN AMERICAN: 46 mL/min — AB (ref >60)
GLUCOSE: 166 mg/dL — AB (ref 65–99)
POTASSIUM: 4.1 mmol/L (ref 3.5–5.1)
SODIUM: 135 mmol/L (ref 135–145)

## 2017-05-10 LAB — GLUCOSE, CAPILLARY
GLUCOSE-CAPILLARY: 115 mg/dL — AB (ref 65–99)
GLUCOSE-CAPILLARY: 150 mg/dL — AB (ref 65–99)
Glucose-Capillary: 121 mg/dL — ABNORMAL HIGH (ref 65–99)
Glucose-Capillary: 179 mg/dL — ABNORMAL HIGH (ref 65–99)

## 2017-05-10 LAB — HEPATIC FUNCTION PANEL
ALT: 12 U/L — ABNORMAL LOW (ref 17–63)
AST: 19 U/L (ref 15–41)
Albumin: 1.6 g/dL — ABNORMAL LOW (ref 3.5–5.0)
Alkaline Phosphatase: 77 U/L (ref 38–126)
Bilirubin, Direct: 0.1 mg/dL (ref 0.1–0.5)
Indirect Bilirubin: 0.6 mg/dL (ref 0.3–0.9)
Total Bilirubin: 0.7 mg/dL (ref 0.3–1.2)
Total Protein: 4.1 g/dL — ABNORMAL LOW (ref 6.5–8.1)

## 2017-05-10 LAB — PROTIME-INR
INR: 2.33
Prothrombin Time: 26 seconds — ABNORMAL HIGH (ref 11.4–15.2)

## 2017-05-10 LAB — COOXEMETRY PANEL
CARBOXYHEMOGLOBIN: 1.8 % — AB (ref 0.5–1.5)
Methemoglobin: 1 % (ref 0.0–1.5)
O2 SAT: 75.2 %
TOTAL HEMOGLOBIN: 9 g/dL — AB (ref 12.0–16.0)

## 2017-05-10 LAB — LACTATE DEHYDROGENASE: LDH: 187 U/L (ref 98–192)

## 2017-05-10 MED ORDER — WARFARIN SODIUM 2 MG PO TABS
2.0000 mg | ORAL_TABLET | Freq: Once | ORAL | Status: AC
  Administered 2017-05-10: 2 mg via ORAL
  Filled 2017-05-10: qty 1

## 2017-05-10 MED ORDER — AMIODARONE HCL 200 MG PO TABS
400.0000 mg | ORAL_TABLET | Freq: Two times a day (BID) | ORAL | Status: DC
  Administered 2017-05-10 – 2017-05-11 (×3): 400 mg via ORAL
  Filled 2017-05-10 (×3): qty 2

## 2017-05-10 NOTE — Progress Notes (Signed)
Physical Therapy Treatment Patient Details Name: James [name] MRN: [id number] DOB: July 04, 1961 Today's Date: 05/10/2017    History of Present Illness  Pt adm with acute on chronic heart failure and underwent Heartware HVAD implant on 6/28. Returned to OR later that day for evacuation of mediastinal hematoma. On 7/4 developed ileus with likely aspiration and intubated 7/4-7/6.   Pt developed Heme + stools with progressive anemia as well as  Kirby FunkVtach 05/02/17.  He underwent EGD with showed suspected AVM 05/03/2017. He was intubated for procedure and extubated 05/08/17 PMH - gout, DMII, HTN, CKD, CAD S/P CABGx6 2012, chronic systolic heart failure and St jude ICD.     PT Comments    Pt admitted with above diagnosis. Pt currently with functional limitations due to balance and endurance deficits. Pt was able to ambulate around partial unit with RW.  Did show signs of fatigue last 15 feet resting several standing rests to get back to room.  Overall progressing and ready for Rehab.  Will continue PT.   Pt will benefit from skilled PT to increase their independence and safety with mobility to allow discharge to the venue listed below.     Follow Up Recommendations  CIR;Supervision for mobility/OOB     Equipment Recommendations  Other (comment) (TBD)    Recommendations for Other Services Rehab consult     Precautions / Restrictions Precautions Precautions: Sternal Precaution Comments: Heartware HVAD    Mobility  Bed Mobility Overal bed mobility: Needs Assistance Bed Mobility: Supine to Sit Rolling: Min assist Sidelying to sit: +2 for physical assistance;Min assist       General bed mobility comments: Pt requires cues to initiate and sequence task as well as assist to move LEs off bed and to lift trunk.  min A for scooting to EOB. Much better today   Transfers Overall transfer level: Needs assistance Equipment used: Rolling walker (2 wheeled) Transfers: Sit to/from Wells FargoStand;Squat Pivot  Transfers Sit to Stand: +2 physical assistance;From elevated surface;Min assist         General transfer comment: Pt needed some assist to power up to RW but once up just min assist for stability.   Ambulation/Gait Ambulation/Gait assistance: Min assist;+2 physical assistance;+2 safety/equipment Ambulation Distance (Feet): 270 Feet Assistive device: Rolling walker (2 wheeled) Gait Pattern/deviations: Step-through pattern;Decreased stride length;Trunk flexed;Wide base of support;Drifts right/left Gait velocity: decreased Gait velocity interpretation: Below normal speed for age/gender General Gait Details: Pt able to ambulate with RW with cues to stand tall.  Pt took 7 standing rest breaks with visible signs of fatigue the last 15 feet of walking; 2/4 DOE with ambulation; VSS; SpO2 WNL on RA.   Stairs            Wheelchair Mobility    Modified Rankin (Stroke Patients Only)       Balance Overall balance assessment: Needs assistance Sitting-balance support: Feet supported;No upper extremity supported Sitting balance-Leahy Scale: Fair Sitting balance - Comments: Pt requires min guard assist for static EOB sitting    Standing balance support: Bilateral upper extremity supported Standing balance-Leahy Scale: Poor Standing balance comment: min assist to stand with RW support                            Cognition Arousal/Alertness: Awake/alert Behavior During Therapy: Flat affect Overall Cognitive Status: Impaired/Different from baseline Area of Impairment: Attention;Following commands  Current Attention Level: Focused Memory: Decreased short-term memory;Decreased recall of precautions Following Commands: Follows one step commands with increased time Safety/Judgement: Decreased awareness of safety   Problem Solving: Requires verbal cues;Requires tactile cues        Exercises      General Comments General comments (skin integrity,  edema, etc.): VAD parameters stayed stable with treatment.  Did not have pt perform VAD equipment today but will continue to allow himto practice next visit.       Pertinent Vitals/Pain Pain Assessment: No/denies pain    Home Living                      Prior Function            PT Goals (current goals can now be found in the care plan section) Progress towards PT goals: Progressing toward goals    Frequency    Min 3X/week      PT Plan Current plan remains appropriate    Co-evaluation PT/OT/SLP Co-Evaluation/Treatment: Yes Reason for Co-Treatment: Complexity of the patient's impairments (multi-system involvement) PT goals addressed during session: Mobility/safety with mobility        AM-PAC PT "6 Clicks" Daily Activity  Outcome Measure  Difficulty turning over in bed (including adjusting bedclothes, sheets and blankets)?: A Little Difficulty moving from lying on back to sitting on the side of the bed? : A Lot Difficulty sitting down on and standing up from a chair with arms (e.g., wheelchair, bedside commode, etc,.)?: A Lot Help needed moving to and from a bed to chair (including a wheelchair)?: A Lot Help needed walking in hospital room?: A Little Help needed climbing 3-5 steps with a railing? : Total 6 Click Score: 13    End of Session Equipment Utilized During Treatment: Gait belt Activity Tolerance: Patient limited by fatigue Patient left: with call bell/phone within reach;in chair Nurse Communication: Mobility status PT Visit Diagnosis: Unsteadiness on feet (R26.81);Muscle weakness (generalized) (M62.81);Difficulty in walking, not elsewhere classified (R26.2)     Time: 1132-1205 PT Time Calculation (min) (ACUTE ONLY): 33 min  Charges:  $Gait Training: 23-37 mins                    G Codes:       James [name],PT Acute Rehabilitation [phone] (pager)    James [name] 05/10/2017, 2:03 PM

## 2017-05-10 NOTE — Progress Notes (Signed)
Chaplain providing follow-up visit with patient.  Patient has cousins in visiting.  Chaplain will remain available should patient request another visit but otherwise will let him visit with his family who is also giving him a shave.    05/10/17 1428  Clinical Encounter Type  Visited With Patient  Visit Type Follow-up;Psychological support;Spiritual support;Social support  Referral From Chaplain  Consult/Referral To E. I. du PontChaplain

## 2017-05-10 NOTE — Progress Notes (Signed)
ANTICOAGULATION CONSULT NOTE - Follow Up Consult  Pharmacy Consult for Warfarin  Indication: HVAD  No Active Allergies  Patient Measurements: Height: 6' (182.9 cm) Weight: 240 lb 4.8 oz (109 kg) IBW/kg (Calculated) : 77.6  Vital Signs: Temp: 97.8 F (36.6 C) (07/21 0829) Temp Source: Oral (07/21 0829) BP: 110/78 (07/21 0800) Pulse Rate: 85 (07/21 0900)  Labs:  Recent Labs  05/08/17 0339 05/09/17 0415 05/10/17 0408  HGB 8.2* 7.5* 8.1*  HCT 24.7* 22.8* 24.8*  PLT 185 204 216  LABPROT 25.2* 29.1* 26.0*  INR 2.24 2.68 2.33  CREATININE 1.95* 1.89* 1.64*    Estimated Creatinine Clearance: 65.7 mL/min (A) (by C-G formula based on SCr of 1.64 mg/dL (H)).   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 He developed an ileus on 7/5, warfarin was held, and heparin bridge started once INR fell below 1.8.  Heparin stopped 7/10 with INR 2.19. On 7/13, Hgb dropped and he had melena - warfarin held again and pharmacy asked to start heparin once INR < 1.8 pending GI workup.   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   Warfarin resumed 7/18 without heparin bridge. INR with fast trend up after 2 doses, now back in goal range at 2.33. Hgb 8.1 - has received multiple PRBCs. Now on oral amiodarone 400 mg BID. Diet advanced to clear liquids.   Goal of Therapy:  INR 2-2.5 Monitor platelets by anticoagulation protocol: Yes   Plan:  1) Warfarin 2 mg x 1 tonight. 2) Daily INR  Tad MooreJessica Edy Belt, Pharm D, BCPS  Clinical Pharmacist Pager [phone]  05/10/2017 9:57 AM

## 2017-05-10 NOTE — Progress Notes (Signed)
4 Days Post-Op Procedure(s) (LRB): ENTEROSCOPY (N/A) HOT HEMOSTASIS (ARGON PLASMA COAGULATION/BICAP) (N/A) Subjective: Continues to show progress after HVAD for ischemic cardiomyopathy and previous CABG  Patient ambulated hallway and is getting stronger Heart rhythm regular now transition to oral amiodarone Still having some dark stools but hemoglobin stable at 8.1 Coumadin resumed with INR 2.3, LDH is low VAD parameters show excellent pulsatility. Mean arterial pressure satisfactory We'll DC A-line today CVP 7, weight down from yesterday, holding diuretics today Diet advanced from liquids to soft diet Surgical incisions clean and dry, driveline next site with daily dressing changes He is off antibiotics We'll remove Foley catheter soon  Objective: Vital signs in last 24 hours: Temp:  [97.8 F (36.6 C)-98.1 F (36.7 C)] 97.8 F (36.6 C) (07/21 0829) Pulse Rate:  [51-85] 61 (07/21 1000) Cardiac Rhythm: Sinus bradycardia (07/21 0800) Resp:  [15-25] 23 (07/21 1000) BP: (85-111)/(67-79) 110/78 (07/21 0800) SpO2:  [90 %-100 %] 92 % (07/21 1000) Arterial Line BP: (86-123)/(64-83) 113/80 (07/21 1000) Weight:  [240 lb 4.8 oz (109 kg)] 240 lb 4.8 oz (109 kg) (07/21 0600)  Hemodynamic parameters for last 24 hours: CVP:  [7 mmHg] 7 mmHg  Intake/Output from previous day: 07/20 0701 - 07/21 0700 In: 1945.4 [P.O.:960; I.V.:648.4; Blood:337] Out: 2195 [Urine:2195] Intake/Output this shift: Total I/O In: 30 [I.V.:30] Out: -        Exam    General- alert and comfortable, back to bed today   Lungs- clear without rales, wheezes   Cor- regular rate and rhythm, no murmur , gallop. Normal VAD hum   Abdomen- soft, non-tender   Extremities - warm, non-tender, 3+ chronic edema   Neuro- oriented, appropriate, no focal weakness   Lab Results:  Recent Labs  05/09/17 0415 05/10/17 0408  WBC 9.1 7.1  HGB 7.5* 8.1*  HCT 22.8* 24.8*  PLT 204 216   BMET:  Recent Labs   05/09/17 0415 05/10/17 0408  NA 137 135  K 4.3 4.1  CL 109 110  CO2 19* 20*  GLUCOSE 165* 166*  BUN 72* 61*  CREATININE 1.89* 1.64*  CALCIUM 7.6* 7.5*    PT/INR:  Recent Labs  05/10/17 0408  LABPROT 26.0*  INR 2.33   ABG    Component Value Date/Time   PHART 7.406 04/28/2017 0407   HCO3 20.2 04/28/2017 0407   TCO2 22 05/02/2017 1628   ACIDBASEDEF 4.0 (H) 04/28/2017 0407   O2SAT 75.2 05/10/2017 0417   CBG (last 3)   Recent Labs  05/09/17 1521 05/09/17 2127 05/10/17 0820  GLUCAP 123* 115* 121*    Assessment/Plan: S/P Procedure(s) (LRB): ENTEROSCOPY (N/A) HOT HEMOSTASIS (ARGON PLASMA COAGULATION/BICAP) (N/A) Continue current care Check hemoglobin this evening Hope to transfer to stepdown/subacute care Monday   LOS: 29 days    James [name] 05/10/2017

## 2017-05-10 NOTE — Progress Notes (Signed)
Patient ID: James [name], male   DOB: April 07, 1961, 56 y.o.   MRN: [id number] \  Advanced Heart Failure VAD Team Note  Subjective:    HVAD placed 6/28.  Returned to the OR that evening with high chest tube output, evacuation of mediastinal hematoma.   Extubated 6/29. Milrinone stopped on 7/4.  Evening of 7/4, patient developed ileus with respiratory compromise. NGT placed with 3 L suctioned out.  CXR with suspicion for aspiration PNA.  Patient had to be intubated.  He went into atrial fibrillation with RVR.  He became hypotensive and was started on norepinephrine and phenylephrine.  Amiodarone gtt begun.     Extubated again on 7/6.   On 7/13 Had 2 sustained episodes of VT. Had to be cardioverted 1. Second episode broke with overdrive pacing with Dr. Graciela Husbands. VAD speed turned down to 2700.   GI bleed --> 2 units PRBCs 7/14, 2 units 7/15. 3 U PRBCs 7/16,  05/05/2017 3 UPRBCs.   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  He had epinephrine, APC, and 2 clips. Intubated prior to procedure and placed on norepinephrine. Speed dropped to 2660.   7/18 Back in VT with overdrive pacing. Lidocaine was started in addition to amiodarone. Extubated.  7/19 lidocaine stopped with confusion. Neuro consulted.  EEG normal.  CT of head no acute findings. 7/20 confusion had resolved.   Today, he is mentally clear.  Walked in hall with PT yesterday.  Feels ok.  Got 1 unit of PRBCs yesterday, hemoglobin up to 8.1 today. Creatinine down to 1.64.  LDH 187, co-ox 75%.  INR 2.33.  Had 2 BMs yesterday, less dark now.  Not short of breath.  CVP checked this morning, 7 mmHg.   HVAD INTERROGATION:  HVAD:  Flow 4.8 liters/min, speed 2660,  power 3.9 W,  Peak 7.2 Trough 3. Suction off. Lavare On. No alarms.    Objective:    Vital Signs:   Temp:  [97.8 F (36.6 C)-98.7 F (37.1 C)] 97.9 F (36.6 C) (07/21 0338) Pulse Rate:  [51-83] 78 (07/21 0700) Resp:  [16-25] 22  (07/21 0600) BP: (85-111)/(67-79) 85/70 (07/21 0400) SpO2:  [90 %-100 %] 90 % (07/21 0700) Arterial Line BP: (86-121)/(64-81) 92/70 (07/21 0700) Weight:  [240 lb 4.8 oz (109 kg)] 240 lb 4.8 oz (109 kg) (07/21 0600) Last BM Date: 05/09/17 Mean arterial Pressure 60-70s  Intake/Output:   Intake/Output Summary (Last 24 hours) at 05/10/17 0744 Last data filed at 05/10/17 0700  Gross per 24 hour  Intake           1945.4 ml  Output             2195 ml  Net           -249.6 ml     Physical Exam  CVP 7. Physical Exam: GENERAL: Appears chronically ill. NAD. In bed  HEENT: normal  NECK: Supple, JVP 7.  2+ bilaterally, no bruits.  No lymphadenopathy or thyromegaly appreciated.  CARDIAC:  Mechanical heart sounds with LVAD hum present.  LUNGS:  Decreased at bases.   ABDOMEN:  Soft, round, nontender, positive bowel sounds x4.     LVAD exit site:   Dressing dry and intact.  No erythema or drainage.  Stabilization device present and accurately applied.  Driveline dressing is being changed daily per sterile technique. EXTREMITIES:  Warm and dry, no cyanosis, clubbing, rash. RUE PICC L radial A line. Gouty nodules on fingers. 1+  edema to knees bilaterally.  NEUROLOGIC:  Alert and oriented x 4.  Gait steady.  No aphasia.  No dysarthria.  Affect pleasant.  GU: Foley  Yellow urine     Telemetry   Personally reviewed, NSR 60s  Labs   Basic Metabolic Panel:  Recent Labs Lab 04/23/2017 0402 05/02/2017 1453 05/07/17 0448 05/08/17 0339 05/09/17 0415 05/10/17 0408  NA 136 137 134* 135 137 135  K 4.0 3.7 4.1 4.2 4.3 4.1  CL 108  --  107 109 109 110  CO2 20*  --  19* 18* 19* 20*  GLUCOSE 229* 231* 201* 164* 165* 166*  BUN 81*  --  83* 79* 72* 61*  CREATININE 1.94*  --  2.08* 1.95* 1.89* 1.64*  CALCIUM 7.5*  --  7.6* 7.8* 7.6* 7.5*  MG 1.8  --  2.0 2.0  --   --   PHOS  --   --   --  4.7*  --   --     Liver Function Tests:  Recent Labs Lab 05/04/17 0355 05/10/17 0408  AST 21 19  ALT  11* 12*  ALKPHOS 56 77  BILITOT 0.7 0.7  PROT 4.2* 4.1*  ALBUMIN 1.7* 1.6*   No results for input(s): LIPASE, AMYLASE in the last 168 hours.  Recent Labs Lab 05/09/17 1804  AMMONIA 34    CBC:  Recent Labs Lab June 04, 2017 0332  05/08/2017 0402 05/15/2017 1453 05/07/17 0448 05/08/17 0339 05/09/17 0415 05/10/17 0408  WBC 12.3*  < > 17.5*  --  16.8* 13.2* 9.1 7.1  NEUTROABS 10.5*  --   --   --   --   --   --   --   HGB 6.5*  < > 7.5* 8.2* 9.3* 8.2* 7.5* 8.1*  HCT 19.1*  < > 22.4* 24.0* 26.7* 24.7* 22.8* 24.8*  MCV 88.8  < > 86.8  --  85.9 87.9 89.1 89.9  PLT 160  < > 178  --  197 185 204 216  < > = values in this interval not displayed.  INR:  Recent Labs Lab 05/13/2017 1130 05/07/17 0448 05/08/17 0339 05/09/17 0415 05/10/17 0408  INR 2.17 1.74 2.24 2.68 2.33    Other results:     Imaging   Ct Head Wo Contrast  Result Date: 05/08/2017 CLINICAL DATA:  Confusion with palilalia EXAM: CT HEAD WITHOUT CONTRAST TECHNIQUE: Contiguous axial images were obtained from the base of the skull through the vertex without intravenous contrast. COMPARISON:  None. FINDINGS: Brain: There is mild diffuse atrophy. There is no intracranial mass, hemorrhage, extra-axial fluid collection, or midline shift. There is patchy small vessel disease in the centra semiovale bilaterally. A small focus of decreased attenuation in the lateral left cerebellum is felt to represent a prior small lacunar type infarct. Elsewhere gray-white compartments appear unremarkable. No acute infarct is evident. Vascular: No hyperdense vessels. There is calcification in each carotid siphon region. Skull: Bony calvarium appears intact. Sinuses/Orbits: There is mucosal thickening in multiple ethmoid air cells. There is mucosal thickening in the left maxillary antrum. Other paranasal sinuses are clear. There is rightward deviation of the nasal septum. Orbits appear symmetric bilaterally. Other: Mastoid air cells are clear. There  is mild bony overgrowth along the anterior aspect of the left external auditory canal. IMPRESSION: Mild atrophy with patchy periventricular small vessel disease. Prior small infarct lateral left cerebellum. No acute appearing infarct evident. No mass, hemorrhage, or extra-axial fluid collection. Foci of arterial vascular calcification noted. Areas of  paranasal sinus disease noted. Benign-appearing bony overgrowth along the anterior left external auditory canal may be. Deviated nasal septum toward the right. Electronically Signed   By: Bretta BangWilliam  Woodruff III M.D.   On: 05/08/2017 15:16     Medications:     Scheduled Medications: . amiodarone  400 mg Oral BID  . Chlorhexidine Gluconate Cloth  6 each Topical Daily  . febuxostat  40 mg Oral Daily  . feeding supplement  1 Container Oral BID BM  . feeding supplement (ENSURE ENLIVE)  237 mL Oral Q24H  . fentaNYL (SUBLIMAZE) injection  50 mcg Intravenous Once  . hydrALAZINE  25 mg Oral Q8H  . insulin aspart  0-15 Units Subcutaneous TID WC  . mouth rinse  15 mL Mouth Rinse BID  . metoCLOPramide (REGLAN) injection  5 mg Intravenous Q8H  . mexiletine  200 mg Oral Q8H  . pantoprazole (PROTONIX) IV  40 mg Intravenous Q12H  . sodium chloride flush  10-40 mL Intracatheter Q12H  . sodium chloride flush  3 mL Intravenous Q12H  . Warfarin - Pharmacist Dosing Inpatient   Does not apply q1800    Infusions: . sodium chloride Stopped (04/19/17 1200)  . sodium chloride Stopped (04/21/17 1500)  . sodium chloride Stopped (05/02/17 0630)  . sodium chloride 10 mL/hr at 05/10/17 0700  . fentaNYL infusion INTRAVENOUS 100 mcg/hr (05/07/17 1600)  . lactated ringers Stopped (05/01/17 0810)  . milrinone Stopped (04/21/2017 2238)  . norepinephrine (LEVOPHED) Adult infusion Stopped (05/09/17 1200)  . phenylephrine (NEO-SYNEPHRINE) Adult infusion Stopped (05/02/17 2000)    PRN Medications: albuterol, docusate, fentaNYL, hydrALAZINE, midazolam, midazolam,  neomycin-bacitracin-polymyxin, ondansetron (ZOFRAN) IV, oxyCODONE, phenol, sodium chloride flush, sodium chloride flush, traMADol   Patient Profile   56 yo with CAD s/p CABG, ischemic cardiomyopathy/chronic systolic CHF, tophaceous gout, and CKD stage 3 was admitted for diuresis and consideration for LVAD placement. S/p HVAD on 6/28  Assessment/Plan:    1. Acute/chronic systolic CHF s/p HVAD: Ischemic cardiomyopathy.  St Jude ICD.  Echo (6/18) with EF 15%, mildly dilated RV with moderately decreased systolic function.  s/p HVAD placement 6/28 and had to return to OR to evacuate mediastinal hematoma.  He had been weaned off pressors/milrinone, but developed ileus w/ likely aspiration event 7/4, re-intubated, developed afib/RVR requiring norepinephrine. Extubated 7/6.  VAD speed turned down to 2660 with VT. VAD parameters currently look good.  Milrinone stopped 7/17 early am with recurrent VT.  Diuretics on hold due to recent suction events/VT and low CVP.  Now off norepinephrine.  Today, CVP 7 and co-ox 75%.  MAP 70s.  - No diuretic today with CVP 7.  Will place Ted hose with peripheral edema.  - Off ASA. Back on warfarin, INR 2.33 => goal now is 2-2.5.   - No dig/spironolactone/arb with elevated creatinine.    2. AKI on CKD stage 3: Suspect a component of peri-op ATN, worsened with ileus, vomiting, intubation/sedation.  Creatinine coming down, now 1.64. 3. Symptomatic anemia due to acute UGI bleeding:  Received 3 units PRBCs 7/16 and 3 units PRBCs on 05/20/2017. 1 unit PRBCs 7/20. 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  Requiring 2 clips + epi + APC. Does not appear to be bleeding actively now, BMs less dark.  Hemoglobin 8.1 today. - Continue IV Protonix - Back on  warfarin, aiming for INR 2-2.5 range. Will leave off aspirin for now.  4. Ventricular tachycardia: Status post ventricular tachycardia 2 on 7/13. Possible suction event.  VAD speed turned down to 2700.   Recurrent VT 7/17. EP called to bedside. Overdrive pacing successful for about 10 minutes but VT recurred.  Milrinone turned off, remained in VT overnight but hemodynamically stable.  Speed decreased to 2660.  On 7/18, we were able to pace him out of VT.  Amiodarone gtt continues and lidocaine added then stopped due to confusion/mental status changes.  No VT overnight.  - Stop IV amiodarone, transition to po.  - Off lidocaine with confusion. Now on mexiletine.  5. CAD: s/p CABG 2012. No S/S ischemia.  Off aspirin due to GI bleeding. 6. Gout: Severe tophaceous gout. No complaint of gouty pain today.  - Continue uloric.  7. Atrial fibrillation: Developed atrial fibrillation with RVR in setting of aspiration PNA on 7/4.  Now maintaining NSR.  Continue amiodarone.   8. Malnutrition: Now off TPN. Per Speech- FEEs performed. Continue full liquids. ST to continue to follow.  Encourage po intake. 9. Aspiration PNA with acute hypoxemic respiratory failure on 7/4: He was extubated on 7/6. Tracheal aspirate with Klebsiella and citrobacter. Both sensitive to Zosyn.  - Completed abx 7/13  10. Ileus: Resolved. Getting Reglan.  11. Deconditioning:  Continue PT, will need CIR.     12. Acute Respiratory Failure: Intubated 7/17 in setting of complex GI intervention. Extubated 7/18. On 4 liters sats stable.  13. Confusion: Possibly related to lidocaine.  Lidocaine stopped. Confusion resolved. CT of head negative. EEG ok. No further workup per neuro.  Mobilize and encourage nutrition.  I reviewed the HVAD parameters from today, and compared the results to the patient's prior recorded data.  No programming changes were made.  The HVAD is functioning within specified parameters.     Marca Ancona, MD 05/10/2017, 7:44 AM  VAD Team --- VAD ISSUES ONLY--- Pager [phone] (7am - 7am) Advanced Heart Failure Team  Pager [phone] (M-F; 7a - 4p)  Please contact [facility] for night-coverage after hours (4p -7a ) and  weekends on [email or web address]

## 2017-05-10 NOTE — Progress Notes (Signed)
Occupational Therapy Progress Note:  Pt significantly improved today.  He is able to perform functional transfers and mobility with min - mod A +2, and ADLs with min - max A.  Cognition appears back to baseline.     Continue to recommend CIR.   05/09/17 1605  OT Visit Information  Last OT Received On 05/10/17  Assistance Needed +2  History of Present Illness Pt adm with acute on chronic heart failure and underwent Heartware HVAD implant on 6/28. Returned to OR later that day for evacuation of mediastinal hematoma. On 7/4 developed ileus with likely aspiration and intubated 7/4-7/6.   Pt developed Heme + stools with progressive anemia as well as  Kirby FunkVtach 05/02/17.  He underwent EGD with showed suspected AVM 04/21/2017. He was intubated for procedure and extubated 05/08/17 PMH - gout, DMII, HTN, CKD, CAD S/P CABGx6 2012, chronic systolic heart failure and St jude ICD.   Precautions  Precautions Sternal  Precaution Comments Heartware HVAD  Pain Assessment  Pain Assessment No/denies pain  Cognition  Arousal/Alertness Awake/alert  Behavior During Therapy Flat affect  Overall Cognitive Status Impaired/Different from baseline  Area of Impairment Attention;Following commands  Current Attention Level Focused  Following Commands Follows one step commands with increased time  Safety/Judgement Decreased awareness of safety  Problem Solving Requires verbal cues;Requires tactile cues  ADL  Overall ADL's  Needs assistance/impaired  Toilet Transfer Minimal assistance;Moderate assistance;+2 for physical assistance;BSC;RW;Ambulation  Bed Mobility  Overal bed mobility Needs Assistance  Bed Mobility Supine to Sit  Rolling Min assist  Sidelying to sit Mod assist;+2 for physical assistance  General bed mobility comments Pt requires cues to initiate and sequence task as well as assist to move LEs off bed and to lift trunk.  min A for scooting to EOB. Much better today   Balance  Overall balance assessment Needs  assistance  Sitting-balance support Feet supported;No upper extremity supported  Sitting balance-Leahy Scale Fair  Sitting balance - Comments Pt requires min guard assist for static EOB sitting   Standing balance support Bilateral upper extremity supported  Standing balance-Leahy Scale Poor  Standing balance comment min assist to stand with RW support  Restrictions  Other Position/Activity Restrictions Sternal precautions  Transfers  Overall transfer level Needs assistance  Equipment used Rolling walker (2 wheeled)  Transfers Sit to/from Starwood HotelsStand;Squat Pivot Transfers  Sit to Stand +2 physical assistance;From elevated surface;Min assist;Mod assist  General transfer comment Pt needed some assist to power up to RW but once up just min assist for stability.   General Comments  General comments (skin integrity, edema, etc.) VAD parameters stayed stable entire treatment.  Wife performed the VAd equipment change each time for practice.   Exercises  Exercises General Lower Extremity  OT - End of Session  Equipment Utilized During Treatment Oxygen  Activity Tolerance Patient tolerated treatment well  Patient left in bed;with call bell/phone within reach  Nurse Communication Mobility status  OT Assessment/Plan  OT Plan Discharge plan remains appropriate  OT Visit Diagnosis Muscle weakness (generalized) (M62.81)  OT Frequency (ACUTE ONLY) Min 3X/week  Recommendations for Other Services Rehab consult  Follow Up Recommendations CIR;Supervision/Assistance - 24 hour  OT Equipment 3 in 1 bedside commode  AM-PAC OT "6 Clicks" Daily Activity Outcome Measure  Help from another person eating meals? 3  Help from another person taking care of personal grooming? 3  Help from another person toileting, which includes using toliet, bedpan, or urinal? 2  Help from another person bathing (including washing,  rinsing, drying)? 2  Help from another person to put on and taking off regular upper body clothing? 2   Help from another person to put on and taking off regular lower body clothing? 2  6 Click Score 14  ADL G Code Conversion CK  OT Goal Progression  Progress towards OT goals Progressing toward goals  OT Time Calculation  OT Start Time (ACUTE ONLY) 1406  OT Stop Time (ACUTE ONLY) 1435  OT Time Calculation (min) 29 min  OT General Charges  $OT Visit 1 Procedure  OT Treatments  $Therapeutic Activity 8-22 mins  [employer], OTR/L [phone]

## 2017-05-11 ENCOUNTER — Inpatient Hospital Stay (HOSPITAL_COMMUNITY): Payer: Medicare PPO

## 2017-05-11 LAB — BASIC METABOLIC PANEL
Anion gap: 6 (ref 5–15)
BUN: 54 mg/dL — AB (ref 6–20)
CHLORIDE: 112 mmol/L — AB (ref 101–111)
CO2: 19 mmol/L — AB (ref 22–32)
Calcium: 7.6 mg/dL — ABNORMAL LOW (ref 8.9–10.3)
Creatinine, Ser: 1.56 mg/dL — ABNORMAL HIGH (ref 0.61–1.24)
GFR calc non Af Amer: 49 mL/min — ABNORMAL LOW (ref >60)
GFR, EST AFRICAN AMERICAN: 56 mL/min — AB (ref >60)
Glucose, Bld: 110 mg/dL — ABNORMAL HIGH (ref 65–99)
POTASSIUM: 3.9 mmol/L (ref 3.5–5.1)
SODIUM: 137 mmol/L (ref 135–145)

## 2017-05-11 LAB — GLUCOSE, CAPILLARY
GLUCOSE-CAPILLARY: 111 mg/dL — AB (ref 65–99)
GLUCOSE-CAPILLARY: 188 mg/dL — AB (ref 65–99)
Glucose-Capillary: 126 mg/dL — ABNORMAL HIGH (ref 65–99)

## 2017-05-11 LAB — CBC
HEMATOCRIT: 26.5 % — AB (ref 39.0–52.0)
HEMOGLOBIN: 8.5 g/dL — AB (ref 13.0–17.0)
MCH: 28.7 pg (ref 26.0–34.0)
MCHC: 32.1 g/dL (ref 30.0–36.0)
MCV: 89.5 fL (ref 78.0–100.0)
Platelets: 273 10*3/uL (ref 150–400)
RBC: 2.96 MIL/uL — AB (ref 4.22–5.81)
RDW: 19.1 % — ABNORMAL HIGH (ref 11.5–15.5)
WBC: 11.6 10*3/uL — ABNORMAL HIGH (ref 4.0–10.5)

## 2017-05-11 LAB — COOXEMETRY PANEL
CARBOXYHEMOGLOBIN: 2 % — AB (ref 0.5–1.5)
Methemoglobin: 0.9 % (ref 0.0–1.5)
O2 Saturation: 74 %
Total hemoglobin: 8.5 g/dL — ABNORMAL LOW (ref 12.0–16.0)

## 2017-05-11 LAB — MAGNESIUM: MAGNESIUM: 2 mg/dL (ref 1.7–2.4)

## 2017-05-11 LAB — LACTATE DEHYDROGENASE: LDH: 205 U/L — AB (ref 98–192)

## 2017-05-11 LAB — PROTIME-INR
INR: 2.15
PROTHROMBIN TIME: 24.4 s — AB (ref 11.4–15.2)

## 2017-05-11 MED ORDER — AMIODARONE HCL IN DEXTROSE 360-4.14 MG/200ML-% IV SOLN
30.0000 mg/h | INTRAVENOUS | Status: DC
  Administered 2017-05-11 – 2017-05-13 (×3): 30 mg/h via INTRAVENOUS
  Filled 2017-05-11 (×4): qty 200

## 2017-05-11 MED ORDER — WARFARIN SODIUM 2.5 MG PO TABS
2.5000 mg | ORAL_TABLET | Freq: Once | ORAL | Status: AC
  Administered 2017-05-11: 2.5 mg via ORAL
  Filled 2017-05-11: qty 1

## 2017-05-11 MED ORDER — AMIODARONE HCL IN DEXTROSE 360-4.14 MG/200ML-% IV SOLN
60.0000 mg/h | INTRAVENOUS | Status: AC
  Administered 2017-05-11: 60 mg/h via INTRAVENOUS
  Filled 2017-05-11: qty 200

## 2017-05-11 MED ORDER — AMIODARONE LOAD VIA INFUSION
150.0000 mg | Freq: Once | INTRAVENOUS | Status: AC
  Administered 2017-05-11: 150 mg via INTRAVENOUS
  Filled 2017-05-11: qty 83.34

## 2017-05-11 MED ORDER — FUROSEMIDE 10 MG/ML IJ SOLN
40.0000 mg | Freq: Two times a day (BID) | INTRAMUSCULAR | Status: DC
  Administered 2017-05-11 (×2): 40 mg via INTRAVENOUS
  Filled 2017-05-11 (×2): qty 4

## 2017-05-11 MED ORDER — POTASSIUM CHLORIDE 20 MEQ PO PACK
40.0000 meq | PACK | Freq: Once | ORAL | Status: AC
  Administered 2017-05-11: 40 meq via ORAL
  Filled 2017-05-11: qty 2

## 2017-05-11 MED ORDER — AMIODARONE HCL IN DEXTROSE 360-4.14 MG/200ML-% IV SOLN
INTRAVENOUS | Status: AC
  Filled 2017-05-11: qty 200

## 2017-05-11 MED ORDER — ISOSORBIDE MONONITRATE ER 30 MG PO TB24
30.0000 mg | ORAL_TABLET | Freq: Every day | ORAL | Status: DC
  Administered 2017-05-11 – 2017-05-28 (×18): 30 mg via ORAL
  Filled 2017-05-11 (×18): qty 1

## 2017-05-11 NOTE — Progress Notes (Signed)
  Speech Language Pathology Treatment: Dysphagia  Patient Details Name: James [name] MRN: [id number] DOB: 09/27/1961 Today's Date: 05/11/2017 Time: [id number] SLP Time Calculation (min) (ACUTE ONLY): 10 min  Assessment / Plan / Recommendation Clinical Impression  Pt's diet was advanced to soft solids/thin liquids yesterday. During session he consumed solids/liquids with no overt s/s of aspiration.  Cognition has returned to baseline; phonation is clear with improved volume.  He describes difficulty with meats/dry solids with associated globus, but reports no coughing nor backflow of materials.  RN reports no s/s of difficulty.  Lung sounds unchanged.  If esophageal symptoms persist, MBS vs barium swallow may be valuable - will follow for PO toleration and necessity of further testing.  Pt agrees with plan.    HPI HPI: 56 year old male admitted 04/05/2017 for LVAD placement. PMH significant for gout, DM2, HTN, CKD, CAD s/p CABGx6 (2012), systemic heart failure, St. Jude ICD. Pt has had 3 intubations this admission. Extubated this morning.       SLP Plan  Continue with current plan of care       Recommendations  Diet recommendations: Dysphagia 3 (mechanical soft);Thin liquid Liquids provided via: Cup;No straw Medication Administration: Whole meds with puree Supervision: Patient able to self feed;Intermittent supervision to cue for compensatory strategies Compensations: Slow rate;Small sips/bites;Multiple dry swallows after each bite/sip Postural Changes and/or Swallow Maneuvers: Seated upright 90 degrees;Upright 30-60 min after meal                Oral Care Recommendations: Oral care BID SLP Visit Diagnosis: Dysphagia, pharyngoesophageal phase (R13.14) Plan: Continue with current plan of care       GO                Blenda MountsCouture, James [name] 05/11/2017, 10:50 AM

## 2017-05-11 NOTE — Progress Notes (Signed)
Pt assisted from chair back to bed x2 RN.  Pt very weak/sob.  Wide complex rhythm on bedside ekg noted at rate of 125.  Dr. Shirlee LatchMcLean notified of rhythm change.  Pt remains weak but states he doesn't feel "much different'.  Orders noted.  Will continue to monitor closely.

## 2017-05-11 NOTE — Plan of Care (Signed)
Problem: Activity: Goal: Risk for activity intolerance will decrease Outcome: Progressing Pt tolerating increase in ambulation distance with staff and P.T. Pt able to get OOB with 1-2 person assist, Pt doing 60-75 % of the transfer.  Problem: Bowel/Gastric: Goal: Will show no signs and symptoms of gastrointestinal bleeding Outcome: Progressing Pt without bloody stools for 3 days. Infrequent BMs appear black without red streaks, H/H stable without dropping.

## 2017-05-11 NOTE — Progress Notes (Signed)
ANTICOAGULATION CONSULT NOTE - Follow Up Consult  Pharmacy Consult for Warfarin  Indication: HVAD  No Active Allergies  Patient Measurements: Height: 6' (182.9 cm) Weight: 240 lb 4.8 oz (109 kg) IBW/kg (Calculated) : 77.6  Vital Signs: Temp: 98.6 F (37 C) (07/22 0800) Temp Source: Oral (07/22 0800) BP: 100/80 (07/22 0800) Pulse Rate: 72 (07/22 0800)  Labs:  Recent Labs  05/09/17 0415 05/10/17 0408 05/10/17 1811 05/11/17 0326  HGB 7.5* 8.1* 8.6* 8.5*  HCT 22.8* 24.8* 26.6* 26.5*  PLT 204 216 246 273  LABPROT 29.1* 26.0*  --  24.4*  INR 2.68 2.33  --  2.15  CREATININE 1.89* 1.64*  --  1.56*    Estimated Creatinine Clearance: 69.1 mL/min (A) (by C-G formula based on SCr of 1.56 mg/dL (H)).   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 He developed an ileus on 7/5, warfarin was held, and heparin bridge started once INR fell below 1.8.  Heparin stopped 7/10 with INR 2.19. On 7/13, Hgb dropped and he had melena - warfarin held again and pharmacy asked to start heparin once INR < 1.8 pending GI workup.   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   Warfarin resumed 7/18 without heparin bridge. INR with fast trend up after 2 doses, now back in goal range at 2.15. Hgb 8.5 - has received multiple PRBCs. Now on oral amiodarone 400 mg BID. Diet advanced.   Goal of Therapy:  INR 2-2.5 Monitor platelets by anticoagulation protocol: Yes   Plan:  1) Warfarin 2.5 mg x 1 tonight. 2) Daily INR  Tad MooreJessica Sherina Stammer, Pharm D, BCPS  Clinical Pharmacist Pager [phone]  05/11/2017 8:21 AM

## 2017-05-11 NOTE — Progress Notes (Signed)
5 Days Post-Op Procedure(s) (LRB): ENTEROSCOPY (N/A) HOT HEMOSTASIS (ARGON PLASMA COAGULATION/BICAP) (N/A) Subjective: Sitting in chair comfortably currently in paced rhythm Patient had episode of rapid VT after being transferred from chair to bed This occurred approximately 30 minutes after receiving IV Lasix 40 mg EP service treated patient with rapid pacing to restore sinus rhythm Amiodarone has been placed back on IV infusion from oral dosing LVAD function remains stable Mean arterial pressures have been stable Chest x-ray taken today shows chronically elevated hemidiaphragm but no effusion or edema  Objective: Vital signs in last 24 hours: Temp:  [97.8 F (36.6 C)-98.6 F (37 C)] 98.6 F (37 C) (07/22 0800) Pulse Rate:  [62-80] 80 (07/22 1300) Cardiac Rhythm: Heart block (07/22 0800) Resp:  [15-27] 22 (07/22 1300) BP: (80-103)/(64-81) 98/81 (07/22 1200) SpO2:  [100 %] 100 % (07/22 1300) Arterial Line BP: (101-117)/(69-74) 117/72 (07/21 1800) Weight:  [240 lb 4.8 oz (109 kg)] 240 lb 4.8 oz (109 kg) (07/22 0600)  Hemodynamic parameters for last 24 hours: CVP:  [7 mmHg-11 mmHg] 7 mmHg  Intake/Output from previous day: 07/21 0701 - 07/22 0700 In: 900 [P.O.:360; I.V.:240] Out: 1490 [Urine:1490] Intake/Output this shift: Total I/O In: 133.2 [I.V.:133.2] Out: 150 [Urine:150]       Exam    General- alert and comfortable   Lungs- clear without rales, wheezes   Cor- regular rate and rhythm, no murmur , gallop normal VAD home   Abdomen- soft, non-tender   Extremities - warm, non-tender, 3+ edema   Neuro- oriented, appropriate, no focal weakness   Lab Results:  Recent Labs  05/10/17 1811 05/11/17 0326  WBC 10.3 11.6*  HGB 8.6* 8.5*  HCT 26.6* 26.5*  PLT 246 273   BMET:  Recent Labs  05/10/17 0408 05/11/17 0326  NA 135 137  K 4.1 3.9  CL 110 112*  CO2 20* 19*  GLUCOSE 166* 110*  BUN 61* 54*  CREATININE 1.64* 1.56*  CALCIUM 7.5* 7.6*    PT/INR:   Recent Labs  05/11/17 0326  LABPROT 24.4*  INR 2.15   ABG    Component Value Date/Time   PHART 7.406 04/28/2017 0407   HCO3 20.2 04/28/2017 0407   TCO2 22 05/02/2017 1628   ACIDBASEDEF 4.0 (H) 04/28/2017 0407   O2SAT 74.0 05/11/2017 0353   CBG (last 3)   Recent Labs  05/10/17 1600 05/10/17 2114 05/11/17 0745  GLUCAP 115* 150* 111*    Assessment/Plan: S/P Procedure(s) (LRB): ENTEROSCOPY (N/A) HOT HEMOSTASIS (ARGON PLASMA COAGULATION/BICAP) (N/A) Upper GI bleeding appears to have resolved Aspiration pneumonia has resolved Ileus has resolved Acute renal failure has resolved and his creatinine is back to baseline 1.7 Continue with oral anticoagulation Surgical incisions clean and dry-remove temporary pacing wires later   LOS: 30 days    James [name] 05/11/2017

## 2017-05-11 NOTE — Progress Notes (Signed)
Patient ID: James [name], male   DOB: 03/26/1961, 56 y.o.   MRN: [id number] \  Advanced Heart Failure VAD Team Note  Subjective:    HVAD placed 6/28.  Returned to the OR that evening with high chest tube output, evacuation of mediastinal hematoma.   Extubated 6/29. Milrinone stopped on 7/4.  Evening of 7/4, patient developed ileus with respiratory compromise. NGT placed with 3 L suctioned out.  CXR with suspicion for aspiration PNA.  Patient had to be intubated.  He went into atrial fibrillation with RVR.  He became hypotensive and was started on norepinephrine and phenylephrine.  Amiodarone gtt begun.     Extubated again on 7/6.   On 7/13 Had 2 sustained episodes of VT. Had to be cardioverted 1. Second episode broke with overdrive pacing with Dr. Graciela Husbands. VAD speed turned down to 2700.   GI bleed --> 2 units PRBCs 7/14, 2 units 7/15. 3 U PRBCs 7/16,  2017-06-03 3 UPRBCs.   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  He had epinephrine, APC, and 2 clips. Intubated prior to procedure and placed on norepinephrine. Speed dropped to 2660.   7/18 Back in VT with overdrive pacing. Lidocaine was started in addition to amiodarone. Extubated.  7/19 lidocaine stopped with confusion. Neuro consulted.  EEG normal.  CT of head no acute findings. 7/20 confusion had resolved.   1 unit PRBCs 7/20.   Stable this morning.  Walked in hall yesterday, gradually getting stronger.  No overt GI bleeding.  Hemoglobin stable at 8.5 with lower creatinine at 1.56.  Weight up overall, CVP 7 in chair but was 11-12 overnight in bed. Co-ox 74%.  MAP running higher in 80s nearing 90.   HVAD INTERROGATION:  HVAD:  Flow 4.5 liters/min, speed 2660,  power 4.1 W,  Peak 7 Trough 3. Suction off. Lavare On. No alarms.    Objective:    Vital Signs:   Temp:  [97.8 F (36.6 C)-98.5 F (36.9 C)] 97.9 F (36.6 C) (07/21 2300) Pulse Rate:  [61-85] 72 (07/22 0700) Resp:  [15-27]  26 (07/22 0700) BP: (97-110)/(73-81) 103/75 (07/22 0600) SpO2:  [92 %-100 %] 100 % (07/22 0700) Arterial Line BP: (101-134)/(69-83) 117/72 (07/21 1800) Weight:  [240 lb 4.8 oz (109 kg)] 240 lb 4.8 oz (109 kg) (07/22 0600) Last BM Date: 05/10/17 Mean arterial Pressure 80s  Intake/Output:   Intake/Output Summary (Last 24 hours) at 05/11/17 0737 Last data filed at 05/11/17 0700  Gross per 24 hour  Intake              900 ml  Output             1490 ml  Net             -590 ml     Physical Exam  CVP 7. Physical Exam: GENERAL: Appears chronically ill. NAD.  HEENT: normal  NECK: Supple, JVP 8-9. No lymphadenopathy or thyromegaly appreciated.  CARDIAC: Mechanical heart sounds with LVAD hum present.  LUNGS: Decreased at bases.  ABDOMEN: Soft, round, nontender, positive bowel sounds x4.  LVAD exit site: Dressing dry and intact. No erythema or drainage. Stabilization device present and accurately applied. Driveline dressing is being changed daily per sterile technique. EXTREMITIES: Warm and dry, no cyanosis, clubbing, rash.RUE PICC L radial A line. Gouty nodules on fingers. 1+ edema to knees bilaterally.  NEUROLOGIC: Alert and oriented x 3. Gait steady. No aphasia. No dysarthria. Affect pleasant.  GU: Foley Yellow urine    Telemetry   Personally reviewed, NSR 60s-70s  Labs   Basic Metabolic Panel:  Recent Labs Lab 05/18/2017 0402  05/07/17 0448 05/08/17 0339 05/09/17 0415 05/10/17 0408 05/11/17 0326  NA 136  < > 134* 135 137 135 137  K 4.0  < > 4.1 4.2 4.3 4.1 3.9  CL 108  --  107 109 109 110 112*  CO2 20*  --  19* 18* 19* 20* 19*  GLUCOSE 229*  < > 201* 164* 165* 166* 110*  BUN 81*  --  83* 79* 72* 61* 54*  CREATININE 1.94*  --  2.08* 1.95* 1.89* 1.64* 1.56*  CALCIUM 7.5*  --  7.6* 7.8* 7.6* 7.5* 7.6*  MG 1.8  --  2.0 2.0  --   --  2.0  PHOS  --   --   --  4.7*  --   --   --   < > = values in this interval not displayed.  Liver Function  Tests:  Recent Labs Lab 05/10/17 0408  AST 19  ALT 12*  ALKPHOS 77  BILITOT 0.7  PROT 4.1*  ALBUMIN 1.6*   No results for input(s): LIPASE, AMYLASE in the last 168 hours.  Recent Labs Lab 05/09/17 1804  AMMONIA 34    CBC:  Recent Labs Lab 04/26/2017 0332  05/08/17 0339 05/09/17 0415 05/10/17 0408 05/10/17 1811 05/11/17 0326  WBC 12.3*  < > 13.2* 9.1 7.1 10.3 11.6*  NEUTROABS 10.5*  --   --   --   --   --   --   HGB 6.5*  < > 8.2* 7.5* 8.1* 8.6* 8.5*  HCT 19.1*  < > 24.7* 22.8* 24.8* 26.6* 26.5*  MCV 88.8  < > 87.9 89.1 89.9 90.8 89.5  PLT 160  < > 185 204 216 246 273  < > = values in this interval not displayed.  INR:  Recent Labs Lab 05/07/17 0448 05/08/17 0339 05/09/17 0415 05/10/17 0408 05/11/17 0326  INR 1.74 2.24 2.68 2.33 2.15    Other results:     Imaging   Dg Chest Port 1 View  Result Date: 05/11/2017 CLINICAL DATA:  LVAD. EXAM: PORTABLE CHEST 1 VIEW COMPARISON:  05/07/2017 FINDINGS: Right-sided PICC line unchanged with tip at the cavoatrial junction. Left-sided pacemaker unchanged. LVAD present. Lungs are hypoinflated demonstrate mild hazy prominence of the perihilar vessels suggesting mild interstitial edema slightly worse. No definite effusion. No pneumothorax. Stable cardiomegaly. Remainder of the exam is unchanged. IMPRESSION: Stable cardiomegaly with slight worsening mild interstitial edema. Tubes and lines as described. Electronically Signed   By: Elberta Fortisaniel  Boyle M.D.   On: 05/11/2017 07:10     Medications:     Scheduled Medications: . amiodarone  400 mg Oral BID  . Chlorhexidine Gluconate Cloth  6 each Topical Daily  . febuxostat  40 mg Oral Daily  . feeding supplement  1 Container Oral BID BM  . feeding supplement (ENSURE ENLIVE)  237 mL Oral Q24H  . fentaNYL (SUBLIMAZE) injection  50 mcg Intravenous Once  . furosemide  40 mg Intravenous BID  . hydrALAZINE  25 mg Oral Q8H  . insulin aspart  0-15 Units Subcutaneous TID WC  .  isosorbide mononitrate  30 mg Oral Daily  . mouth rinse  15 mL Mouth Rinse BID  . metoCLOPramide (REGLAN) injection  5 mg Intravenous Q8H  . mexiletine  200 mg Oral Q8H  . pantoprazole (PROTONIX) IV  40 mg Intravenous  Q12H  . potassium chloride  40 mEq Oral Once  . sodium chloride flush  10-40 mL Intracatheter Q12H  . Warfarin - Pharmacist Dosing Inpatient   Does not apply q1800    Infusions: . sodium chloride Stopped (04/19/17 1200)  . sodium chloride Stopped (04/21/17 1500)  . sodium chloride Stopped (05/02/17 0630)  . sodium chloride 10 mL/hr at 05/11/17 0600  . fentaNYL infusion INTRAVENOUS 100 mcg/hr (05/07/17 1600)  . lactated ringers Stopped (05/01/17 0810)  . milrinone Stopped (04/27/2017 2238)  . norepinephrine (LEVOPHED) Adult infusion Stopped (05/09/17 1200)  . phenylephrine (NEO-SYNEPHRINE) Adult infusion Stopped (05/02/17 2000)    PRN Medications: albuterol, docusate, fentaNYL, hydrALAZINE, neomycin-bacitracin-polymyxin, ondansetron (ZOFRAN) IV, oxyCODONE, phenol, sodium chloride flush, traMADol   Patient Profile   56 yo with CAD s/p CABG, ischemic cardiomyopathy/chronic systolic CHF, tophaceous gout, and CKD stage 3 was admitted for diuresis and consideration for LVAD placement. S/p HVAD on 6/28  Assessment/Plan:    1. Acute/chronic systolic CHF s/p HVAD: Ischemic cardiomyopathy.  [employer] ICD.  Echo (6/18) with EF 15%, mildly dilated RV with moderately decreased systolic function.  s/p HVAD placement 6/28 and had to return to OR to evacuate mediastinal hematoma.  He had been weaned off pressors/milrinone, but developed ileus w/ likely aspiration event 7/4, re-intubated, developed afib/RVR requiring norepinephrine. Extubated 7/6.  VAD speed turned down to 2660 with VT. VAD parameters currently look good.  Milrinone stopped 7/17 early am with recurrent VT.  Now off norepinephrine.  Today, CVP 7 in chair and 11-12 in bed and co-ox 74%.  MAP to upper 80s.  Weight up.  I  suspect that he has some volume overload.  - Lasix 40 mg IV bid and will give a dose of K.  - Off ASA. Back on warfarin, INR 2.15 => goal now is 2-2.5.   - No dig/spironolactone/arb with elevated creatinine.    - Hydralazine/imdur started with MAP into the upper 80s.  2. AKI on CKD stage 3: Suspect a component of peri-op ATN, worsened with ileus, vomiting, intubation/sedation.  Creatinine coming down, now 1.56. 3. Symptomatic anemia due to acute UGI bleeding:  Received 3 units PRBCs 7/16 and 3 units PRBCs on 05/16/2017. 1 unit PRBCs 7/20. 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  Requiring 2 clips + epi + APC. Does not appear to be bleeding actively now, BMs less dark.  Hemoglobin 8.5 today. - Continue IV Protonix - Back on  warfarin, aiming for INR 2-2.5 range. Will leave off aspirin for now.  4. Ventricular tachycardia: Status post ventricular tachycardia 2 on 7/13. Possible suction event. VAD speed turned down to 2700.  Recurrent VT 7/17. EP called to bedside. Overdrive pacing successful for about 10 minutes but VT recurred.  Milrinone turned off, remained in VT overnight but hemodynamically stable.  Speed decreased to 2660.  On 7/18, we were able to pace him out of VT.  Amiodarone gtt continues and lidocaine added then stopped due to confusion/mental status changes.  No VT overnight.  - Continue po amiodarone.   - Off lidocaine with confusion. Now on mexiletine.  5. CAD: s/p CABG 2012. No S/S ischemia.  Off aspirin due to GI bleeding. 6. Gout: Severe tophaceous gout. No complaint of gouty pain today.  - Continue uloric.  7. Atrial fibrillation: Developed atrial fibrillation with RVR in setting of aspiration PNA on 7/4.  Now maintaining NSR.  Continue amiodarone.   8. Malnutrition: Now off TPN. Per Speech- FEEs  performed. Continue full liquids. ST to continue to follow.  Encourage po intake. 9. Aspiration PNA with acute hypoxemic respiratory failure on 7/4: He  was extubated on 7/6. Tracheal aspirate with Klebsiella and citrobacter. Both sensitive to Zosyn.  - Completed abx 7/13  10. Ileus: Resolved. Getting Reglan.  11. Deconditioning:  Continue PT, will need CIR.     12. Acute Respiratory Failure: Intubated 7/17 in setting of complex GI intervention. Extubated 7/18. On 4 liters sats stable.  13. Delirium: Resolved. Possibly related to lidocaine.  Lidocaine stopped. Confusion resolved. CT of head negative. EEG ok. No further workup per neuro.  Mobilize and encourage nutrition.  I reviewed the HVAD parameters from today, and compared the results to the patient's prior recorded data.  No programming changes were made.  The HVAD is functioning within specified parameters.     Marca Ancona, MD 05/11/2017, 7:37 AM  VAD Team --- VAD ISSUES ONLY--- Pager [phone] (7am - 7am) Advanced Heart Failure Team  Pager [phone] (M-F; 7a - 4p)  Please contact [facility] for night-coverage after hours (4p -7a ) and weekends on [email or web address]

## 2017-05-11 NOTE — Progress Notes (Signed)
Edd FabianHarry D Mastrogiovanni went into VT when transferring from the chair to the bed. HR 127 BPM. Patient feeling weak and tired when in VT. NIPS performed and converted to sinus rhythm with pacing at 370 msec for 5.5 seconds. Continue IV amiodarone.  Hersel Mcmeen Elberta Fortisamnitz, MD 05/11/2017 10:59 AM

## 2017-05-12 LAB — GLUCOSE, CAPILLARY
GLUCOSE-CAPILLARY: 166 mg/dL — AB (ref 65–99)
GLUCOSE-CAPILLARY: 155 mg/dL — AB (ref 65–99)
Glucose-Capillary: 116 mg/dL — ABNORMAL HIGH (ref 65–99)
Glucose-Capillary: 113 mg/dL — ABNORMAL HIGH (ref 65–99)

## 2017-05-12 LAB — DIFFERENTIAL
Basophils Absolute: 0 10*3/uL (ref 0.0–0.1)
Basophils Relative: 0 %
EOS PCT: 0 %
Eosinophils Absolute: 0.1 10*3/uL (ref 0.0–0.7)
LYMPHS ABS: 0.8 10*3/uL (ref 0.7–4.0)
LYMPHS PCT: 4 %
MONO ABS: 1.4 10*3/uL — AB (ref 0.1–1.0)
Monocytes Relative: 8 %
Neutro Abs: 15.6 10*3/uL — ABNORMAL HIGH (ref 1.7–7.7)
Neutrophils Relative %: 88 %

## 2017-05-12 LAB — BASIC METABOLIC PANEL
ANION GAP: 6 (ref 5–15)
BUN: 50 mg/dL — ABNORMAL HIGH (ref 6–20)
CO2: 20 mmol/L — AB (ref 22–32)
Calcium: 7.4 mg/dL — ABNORMAL LOW (ref 8.9–10.3)
Chloride: 109 mmol/L (ref 101–111)
Creatinine, Ser: 1.65 mg/dL — ABNORMAL HIGH (ref 0.61–1.24)
GFR calc Af Amer: 53 mL/min — ABNORMAL LOW (ref >60)
GFR, EST NON AFRICAN AMERICAN: 46 mL/min — AB (ref >60)
GLUCOSE: 184 mg/dL — AB (ref 65–99)
POTASSIUM: 3.7 mmol/L (ref 3.5–5.1)
Sodium: 135 mmol/L (ref 135–145)

## 2017-05-12 LAB — CBC
HEMATOCRIT: 26.9 % — AB (ref 39.0–52.0)
HEMOGLOBIN: 8.7 g/dL — AB (ref 13.0–17.0)
MCH: 29 pg (ref 26.0–34.0)
MCHC: 32.3 g/dL (ref 30.0–36.0)
MCV: 89.7 fL (ref 78.0–100.0)
Platelets: 309 10*3/uL (ref 150–400)
RBC: 3 MIL/uL — AB (ref 4.22–5.81)
RDW: 19 % — ABNORMAL HIGH (ref 11.5–15.5)
WBC: 17.8 10*3/uL — AB (ref 4.0–10.5)

## 2017-05-12 LAB — COOXEMETRY PANEL
Carboxyhemoglobin: 1.9 % — ABNORMAL HIGH (ref 0.5–1.5)
Methemoglobin: 0.9 % (ref 0.0–1.5)
O2 Saturation: 75.1 %
Total hemoglobin: 8.9 g/dL — ABNORMAL LOW (ref 12.0–16.0)

## 2017-05-12 LAB — PROTIME-INR
INR: 2.25
Prothrombin Time: 25.2 seconds — ABNORMAL HIGH (ref 11.4–15.2)

## 2017-05-12 LAB — LACTATE DEHYDROGENASE: LDH: 212 U/L — ABNORMAL HIGH (ref 98–192)

## 2017-05-12 MED ORDER — HYDRALAZINE HCL 25 MG PO TABS: 37.5000 mg | ORAL_TABLET | Freq: Three times a day (TID) | ORAL | Status: DC

## 2017-05-12 MED ORDER — WARFARIN SODIUM 2 MG PO TABS
2.0000 mg | ORAL_TABLET | Freq: Once | ORAL | Status: AC
  Administered 2017-05-12: 2 mg via ORAL
  Filled 2017-05-12: qty 1

## 2017-05-12 MED ORDER — FEBUXOSTAT 40 MG PO TABS
120.0000 mg | ORAL_TABLET | Freq: Every day | ORAL | Status: DC
  Administered 2017-05-12 – 2017-06-25 (×45): 120 mg via ORAL
  Filled 2017-05-12 (×45): qty 3

## 2017-05-12 MED ORDER — POTASSIUM CHLORIDE CRYS ER 20 MEQ PO TBCR
20.0000 meq | EXTENDED_RELEASE_TABLET | ORAL | Status: AC
  Administered 2017-05-12 (×3): 20 meq via ORAL
  Filled 2017-05-12 (×3): qty 1

## 2017-05-12 MED ORDER — POTASSIUM CHLORIDE CRYS ER 20 MEQ PO TBCR: 20.0000 meq | EXTENDED_RELEASE_TABLET | ORAL | Status: DC | PRN

## 2017-05-12 MED ORDER — HYDRALAZINE HCL 25 MG PO TABS
37.5000 mg | ORAL_TABLET | Freq: Three times a day (TID) | ORAL | Status: DC
  Administered 2017-05-12 – 2017-05-15 (×7): 37.5 mg via ORAL
  Filled 2017-05-12 (×8): qty 2

## 2017-05-12 NOTE — Progress Notes (Signed)
We continue to follow pt at a distance. [phone]

## 2017-05-12 NOTE — Progress Notes (Signed)
Patient ID: James [name], male   DOB: 06/29/1961, 56 y.o.   MRN: [id number] Patient ID: James [name], male   DOB: 06/29/1961, 56 y.o.   MRN: [id number] \  Advanced Heart Failure VAD Team Note  Subjective:    HVAD placed 6/28.  Returned to the OR that evening with high chest tube output, evacuation of mediastinal hematoma.   Extubated 6/29. Milrinone stopped on 7/4.  Evening of 7/4, patient developed ileus with respiratory compromise. NGT placed with 3 L suctioned out.  CXR with suspicion for aspiration PNA.  Patient had to be intubated.  He went into atrial fibrillation with RVR.  He became hypotensive and was started on norepinephrine and phenylephrine.  Amiodarone gtt begun.     Extubated again on 7/6.   On 7/13 Had 2 sustained episodes of VT. Had to be cardioverted 1. Second episode broke with overdrive pacing with Dr. Graciela HusbandsKlein. VAD speed turned down to 2700.   GI bleed --> 2 units PRBCs 7/14, 2 units 7/15. 3 U PRBCs 7/16,  04/20/2017 3 UPRBCs.   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  He had epinephrine, APC, and 2 clips. Intubated prior to procedure and placed on norepinephrine. Speed dropped to 2660.   7/18 Back in VT with overdrive pacing. Lidocaine was started in addition to amiodarone. Extubated.  7/19 lidocaine stopped with confusion. Neuro consulted.  EEG normal.  CT of head no acute findings. 7/20 confusion had resolved.   1 unit PRBCs 7/20.   On 7/22, developed sustained VT with rate around 130 shortly after getting IV Lasix.  We were able to pace him out and back to NSR.  He was put back on amiodarone gtt.   He diuresed reasonably yesterday, weight down, CVP 7 this morning.  Co-ox 76%.  INR 2.25.  Hemoglobin stable at 8.7, no overt bleeding.  No dyspnea at rest.  MAP 80s-90s. WBCs higher today but afebrile.  CXR yesterday with no PNA.   HVAD INTERROGATION:  HVAD:  Flow 4.7 liters/min, speed 2660,  power 4.0 W,  Peak 7 Trough  3. Suction off. Lavare On. No alarms.   Objective:    Vital Signs:   Temp:  [98.6 F (37 C)-99.9 F (37.7 C)] 98.6 F (37 C) (07/23 0000) Pulse Rate:  [69-80] 73 (07/23 0600) Resp:  [20-35] 28 (07/23 0600) BP: (80-115)/(64-93) 92/71 (07/23 0600) SpO2:  [100 %] 100 % (07/23 0600) Weight:  [236 lb 15.9 oz (107.5 kg)] 236 lb 15.9 oz (107.5 kg) (07/23 0500) Last BM Date: 05/12/17 Mean arterial Pressure 80s  Intake/Output:   Intake/Output Summary (Last 24 hours) at 05/12/17 0655 Last data filed at 05/12/17 0654  Gross per 24 hour  Intake           1718.7 ml  Output             1935 ml  Net           -216.3 ml     Physical Exam  CVP 7. Physical Exam: GENERAL: Appears chronically ill. NAD.  HEENT: normal  NECK: Supple, JVP 7. No lymphadenopathy or thyromegaly appreciated.  CARDIAC: Mechanical heart sounds with LVAD hum present.  LUNGS: Decreased at bases.  ABDOMEN: Soft, round, nontender, positive bowel sounds x4.  LVAD exit site: Dressing dry and intact. No erythema or drainage. Stabilization device present and accurately applied. Driveline dressing is being changed daily per sterile technique. EXTREMITIES: Warm and dry, no cyanosis, clubbing,  rash.RUE PICC L radial A line. Gouty nodules on fingers. 1+ edema to knees bilaterally.  NEUROLOGIC: Alert and oriented x 3. Gait steady. No aphasia. No dysarthria. Affect pleasant.  GU: Foley Yellow urine     Telemetry   Personally reviewed, NSR 60s-70s currently, had slow VT around 130 yesterday.   Labs   Basic Metabolic Panel:  Recent Labs Lab 05/14/2017 0402  05/07/17 0448 05/08/17 4098 05/09/17 0415 05/10/17 0408 05/11/17 0326 05/12/17 0413  NA 136  < > 134* 135 137 135 137 135  K 4.0  < > 4.1 4.2 4.3 4.1 3.9 3.7  CL 108  --  107 109 109 110 112* 109  CO2 20*  --  19* 18* 19* 20* 19* 20*  GLUCOSE 229*  < > 201* 164* 165* 166* 110* 184*  BUN 81*  --  83* 79* 72* 61* 54* 50*  CREATININE 1.94*  --   2.08* 1.95* 1.89* 1.64* 1.56* 1.65*  CALCIUM 7.5*  --  7.6* 7.8* 7.6* 7.5* 7.6* 7.4*  MG 1.8  --  2.0 2.0  --   --  2.0  --   PHOS  --   --   --  4.7*  --   --   --   --   < > = values in this interval not displayed.  Liver Function Tests:  Recent Labs Lab 05/10/17 0408  AST 19  ALT 12*  ALKPHOS 77  BILITOT 0.7  PROT 4.1*  ALBUMIN 1.6*   No results for input(s): LIPASE, AMYLASE in the last 168 hours.  Recent Labs Lab 05/09/17 1804  AMMONIA 34    CBC:  Recent Labs Lab 05/09/17 0415 05/10/17 0408 05/10/17 1811 05/11/17 0326 05/12/17 0413  WBC 9.1 7.1 10.3 11.6* 17.8*  NEUTROABS  --   --   --   --  15.6*  HGB 7.5* 8.1* 8.6* 8.5* 8.7*  HCT 22.8* 24.8* 26.6* 26.5* 26.9*  MCV 89.1 89.9 90.8 89.5 89.7  PLT 204 216 246 273 309    INR:  Recent Labs Lab 05/08/17 0339 05/09/17 0415 05/10/17 0408 05/11/17 0326 05/12/17 0413  INR 2.24 2.68 2.33 2.15 2.25    Other results:     Imaging   Dg Chest Port 1 View  Result Date: 05/11/2017 CLINICAL DATA:  LVAD. EXAM: PORTABLE CHEST 1 VIEW COMPARISON:  05/07/2017 FINDINGS: Right-sided PICC line unchanged with tip at the cavoatrial junction. Left-sided pacemaker unchanged. LVAD present. Lungs are hypoinflated demonstrate mild hazy prominence of the perihilar vessels suggesting mild interstitial edema slightly worse. No definite effusion. No pneumothorax. Stable cardiomegaly. Remainder of the exam is unchanged. IMPRESSION: Stable cardiomegaly with slight worsening mild interstitial edema. Tubes and lines as described. Electronically Signed   By: Elberta Fortis M.D.   On: 05/11/2017 07:10     Medications:     Scheduled Medications: . Chlorhexidine Gluconate Cloth  6 each Topical Daily  . febuxostat  40 mg Oral Daily  . feeding supplement  1 Container Oral BID BM  . feeding supplement (ENSURE ENLIVE)  237 mL Oral Q24H  . fentaNYL (SUBLIMAZE) injection  50 mcg Intravenous Once  . hydrALAZINE  37.5 mg Oral Q8H  .  insulin aspart  0-15 Units Subcutaneous TID WC  . isosorbide mononitrate  30 mg Oral Daily  . mouth rinse  15 mL Mouth Rinse BID  . metoCLOPramide (REGLAN) injection  5 mg Intravenous Q8H  . mexiletine  200 mg Oral Q8H  . pantoprazole (PROTONIX) IV  40 mg Intravenous Q12H  . potassium chloride  20 mEq Oral Q4H  . sodium chloride flush  10-40 mL Intracatheter Q12H  . Warfarin - Pharmacist Dosing Inpatient   Does not apply q1800    Infusions: . sodium chloride Stopped (04/19/17 1200)  . sodium chloride 10 mL/hr at 05/11/17 1800  . sodium chloride Stopped (05/02/17 0630)  . sodium chloride 10 mL/hr at 05/11/17 1900  . amiodarone 30 mg/hr (05/11/17 1930)  . fentaNYL infusion INTRAVENOUS 100 mcg/hr (05/07/17 1600)  . lactated ringers Stopped (05/01/17 0810)  . norepinephrine (LEVOPHED) Adult infusion Stopped (05/09/17 1200)  . phenylephrine (NEO-SYNEPHRINE) Adult infusion Stopped (05/02/17 2000)    PRN Medications: albuterol, docusate, fentaNYL, hydrALAZINE, neomycin-bacitracin-polymyxin, ondansetron (ZOFRAN) IV, oxyCODONE, phenol, sodium chloride flush, traMADol   Patient Profile   56 yo with CAD s/p CABG, ischemic cardiomyopathy/chronic systolic CHF, tophaceous gout, and CKD stage 3 was admitted for diuresis and consideration for LVAD placement. S/p HVAD on 6/28  Assessment/Plan:    1. Acute/chronic systolic CHF s/p HVAD: Ischemic cardiomyopathy.  St Jude ICD.  Echo (6/18) with EF 15%, mildly dilated RV with moderately decreased systolic function.  s/p HVAD placement 6/28 and had to return to OR to evacuate mediastinal hematoma.  He had been weaned off pressors/milrinone, but developed ileus w/ likely aspiration event 7/4, re-intubated, developed afib/RVR requiring norepinephrine. Extubated 7/6.  VAD speed turned down to 2660 with VT. VAD parameters currently look good.  Milrinone stopped 7/17 early am with recurrent VT.  Now off norepinephrine.  He got IV Lasix x 2 yesterday, weight  down.  Today, CVP 7 and co-ox 76%.  MAP 80s-90s.   - No Lasix today with CVP 7.  Abrupt fluid shifts seem to trigger VT, last episode of VT occurred about 30 minutes after IV Lasix.  - Off ASA. Back on warfarin, INR 2.25 => goal now is 2-2.5.   - No dig/spironolactone/arb with elevated creatinine.    - MAP higher today, increase hydralazine to 37.5 mg tid.  2. AKI on CKD stage 3: Suspect a component of peri-op ATN, worsened with ileus, vomiting, intubation/sedation.  Creatinine stable at 1.6. 3. Symptomatic anemia due to acute UGI bleeding:  Received 3 units PRBCs 7/16 and 3 units PRBCs on 2017/05/13. 1 unit PRBCs 7/20. 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  Requiring 2 clips + epi + APC. Does not appear to be bleeding actively now, BMs less dark.  Hemoglobin 8.7 today. - Continue IV Protonix - Back on  warfarin, aiming for INR 2-2.5 range. Will leave off aspirin for now.  4. Ventricular tachycardia: Status post ventricular tachycardia 2 on 7/13. Possible suction event. VAD speed turned down to 2700.  Recurrent VT 7/17. EP called to bedside. Overdrive pacing successful for about 10 minutes but VT recurred.  Milrinone turned off, remained in VT overnight but hemodynamically stable.  Speed decreased to 2660.  On 7/18, we were able to pace him out of VT.  Lidocaine added then stopped due to confusion/mental status changes.  He had VT yesterday about 30 minutes after getting IV Lasix, had to be paced out again => may have been due to rapid fluid shift.   - No Lasix today with CVP 7. Will try to avoid IV Lasix in future (use po).  - Continue amiodarone IV today, probably back to po tomorrow.   - Off lidocaine with confusion. Now on mexiletine.  5. CAD: s/p CABG 2012. No S/S ischemia.  Off aspirin due to GI bleeding. 6. Gout: Severe tophaceous gout. No complaint of gouty pain today.  - Continue uloric.  7. Atrial fibrillation: Developed atrial fibrillation with RVR in  setting of aspiration PNA on 7/4.  Now maintaining NSR.  Continue amiodarone.   8. Malnutrition: Now off TPN. Per Speech- FEEs performed. Continue full liquids. ST to continue to follow.  Encourage po intake. 9. Aspiration PNA with acute hypoxemic respiratory failure on 7/4: He was extubated on 7/6. Tracheal aspirate with Klebsiella and citrobacter. Both sensitive to Zosyn.  - Completed abx 7/13  10. Ileus: Resolved. Getting Reglan.  11. Deconditioning:  Continue PT, will need CIR.     12. Acute Respiratory Failure: Intubated 7/17 in setting of complex GI intervention. Extubated 7/18. On 4 liters sats stable.  13. Delirium: Resolved. Possibly related to lidocaine.  Lidocaine stopped. Confusion resolved. CT of head negative. EEG ok. No further workup per neuro. No change.  14. ID: WBCs higher today but no fever.  No PNA on CXR yesterday.  Will follow closely.   Mobilize and encourage nutrition.  I reviewed the HVAD parameters from today, and compared the results to the patient's prior recorded data.  No programming changes were made.  The HVAD is functioning within specified parameters.     Marca Ancona, MD 05/12/2017, 6:55 AM  VAD Team --- VAD ISSUES ONLY--- Pager [phone] (7am - 7am) Advanced Heart Failure Team  Pager [phone] (M-F; 7a - 4p)  Please contact [facility] for night-coverage after hours (4p -7a ) and weekends on [email or web address]

## 2017-05-12 NOTE — Progress Notes (Signed)
LVAD Inpatient Coordinator Rounding Note:  Admitted 03/27/2017 due to A/C Heart failure.   HeartWare LVAD implanted on 03/30/2017 by Dr. Laneta SimmersBartle as DT VAD.  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 He had epinephrine, APC, and 2 clips.   Vital signs: HR: 76 Doppler:  90 Auto BP:  99/82 (89) O2 Sat: 100% on RA Wt:207>209 > 213 >213 > 208>203>220>227lbs...........>225>223>225>230>234>233>240>240>236   LVAD interrogation reveals:  Speed: 2660 Flow: 4.4 Power: 4.0 Alarms:none Peak: 7.0 Trough: 3.0 HCT: 23 Low flow alarm setting: 2.5 High watt alarm setting: 6.5     Suction: on  Lavare cycle: on  Blood Products: 6/28> 5 PRBC's, 6 FFP 7/1> 1 PRBC 7/9> 1 PRBC 7/13>3 PRBC 7/14>3 PRBC 7/15> 1 PRBC 7/16>3PRBC 7/17> 3 PRBC 7/20> 1 PRBC  Gtts: Milrinone restarted 7/8, stopped 7/16 Levo stopped 04/26/17 restarted 05/09/2017 - stopped 05/07/17 Amiodarone stopped 04/29/17, restarted 7/17 continuous at 30 mg/hr Heparin stopped 04/29/17 Lidocaine 05/07/17 - stopped 7/19  TPN - started 04/24/17 for nutritional support, stopped 04/30/17   Arrhythmia: 04/24/17 - Afib with RVR - started amiodarone, now PO 05/02/17 - 2 sustained episodes of VT - cardioverted x 1; second broke with overdrive pacing 7/17- wide complex tachycardia- Amio bolus x3 and gtt at 60 mg/hr 05/07/17 - sustained VT - converted with overdrive pacing; Lidocaine started in addition to amiodarone 05/08/17 - Lidocaine stopped due to confusion (lido level 12.4)  Respiratory: 04/23/17 - re-intubated due to respiratory failure secondary to suspected aspiration pneumonia 04/25/17-extubated 05/09/2017- intubated for EGD 05/20/2017- extubated  Drive Line: Daily Dressing Kits with Aquacell AG silver strips per protocol.   Labs:  LDH trend:160 (pre VAD)>227>215......200>183>161>164>184>195>190>188>187>205>212  INR trend:  1.31>1.45>1.44>2.09>2.28>3.58>3.94>3.22>2.17>1.74>1.7>2.24>2.68>2.33>2.15>2.25  Anticoagulation Plan: -INR Goal: 2-2.5  -ASA Dose: 325 mg- on hold  Adverse Events on VAD: [facility] - 04/23/17 Ileus with vomiting; re-intubation for acute respiratory failure; probable aspiration pneumonia - 7/13-GI bleed - 7/19 - Lidocaine gtt stopped due to confusion (lido toxicity)  VAD Coordinator   Plan/Recommendations:  1. Continue daily drive line dressing changes with patient's wife.  2. Please call VAD pager with patient and equipment concerns.  Hessie DienerMolly Cynethia Schindler RN, VAD Coordinator 24/7 pager [phone]

## 2017-05-12 NOTE — Progress Notes (Signed)
K+ 3.7;  Replaced with PO Potassium this morning per protocol.

## 2017-05-12 NOTE — Progress Notes (Signed)
Physical Therapy Treatment Patient Details Name: James FabianHarry D Autrey MRN: [id number] DOB: 1961-01-05 Today's Date: 05/12/2017    History of Present Illness  Pt adm with acute on chronic heart failure and underwent Heartware HVAD implant on 6/28. Returned to OR later that day for evacuation of mediastinal hematoma. On 7/4 developed ileus with likely aspiration and intubated 7/4-7/6.   Pt developed Heme + stools with progressive anemia as well as  Kirby FunkVtach 05/02/17.  He underwent EGD with showed suspected AVM 04/25/2017. He was intubated for procedure and extubated 05/08/17 PMH - gout, DMII, HTN, CKD, CAD S/P CABGx6 2012, chronic systolic heart failure and St jude ICD.     PT Comments    Patient tolerated ambulating 28400ft with min A +2 and required more assist for transfers and bed mobility due to fatigue/weakness. Pt sat up in recliner >3hours prior to session. Pt continues to be eager to mobilize. Worked on management of HVAD equipment again today. Pt continues to need assistance to manage lines due to gout in fingers but pt is able to state how to set up for ambulating.  Continue to progress as tolerated.   Follow Up Recommendations  CIR;Supervision for mobility/OOB     Equipment Recommendations  Other (comment) (TBD)    Recommendations for Other Services Rehab consult     Precautions / Restrictions Precautions Precautions: Sternal Precaution Comments: Heartware HVAD    Mobility  Bed Mobility Overal bed mobility: Needs Assistance Bed Mobility: Sit to Supine       Sit to supine: Mod assist;+2 for physical assistance   General bed mobility comments: cues for sequencing; assist at trunk and bilat LE  Transfers Overall transfer level: Needs assistance Equipment used: Rolling walker (2 wheeled) Transfers: Sit to/from Stand Sit to Stand: +2 physical assistance;Mod assist         General transfer comment: assist to power up into standing and to gain balance upon stand as pt had posterior  bias; multimodal cues for hand placement to stand and on RW; pt kept saying "push me forward" and needed hand over hand assist to use AD   Ambulation/Gait Ambulation/Gait assistance: Min assist;+2 safety/equipment Ambulation Distance (Feet): 200 Feet Assistive device: Rolling walker (2 wheeled) Gait Pattern/deviations: Step-through pattern;Decreased stride length;Trunk flexed;Wide base of support Gait velocity: decreased   General Gait Details: cues for posture and decreased UE weigthbearing; 2/4 DOE but denied need for more rest breaks; 1 brief standing rest break required   Stairs            Wheelchair Mobility    Modified Rankin (Stroke Patients Only)       Balance Overall balance assessment: Needs assistance Sitting-balance support: Feet supported;No upper extremity supported Sitting balance-Leahy Scale: Fair     Standing balance support: Bilateral upper extremity supported Standing balance-Leahy Scale: Poor                              Cognition Arousal/Alertness: Awake/alert Behavior During Therapy: Flat affect Overall Cognitive Status: Impaired/Different from baseline Area of Impairment: Memory;Safety/judgement;Following commands;Problem solving                     Memory: Decreased short-term memory;Decreased recall of precautions Following Commands: Follows one step commands with increased time Safety/Judgement: Decreased awareness of safety   Problem Solving: Requires verbal cues;Requires tactile cues        Exercises      General Comments General comments (skin integrity,  edema, etc.): pt sat up in chair for >3 hours prior to session      Pertinent Vitals/Pain Pain Assessment: Faces Faces Pain Scale: Hurts little more Pain Location: painful fingers due to gout and bilat LE/feet Pain Descriptors / Indicators: Guarding;Sore Pain Intervention(s): Limited activity within patient's tolerance;Monitored during session;Repositioned     Home Living                      Prior Function            PT Goals (current goals can now be found in the care plan section) Progress towards PT goals: Progressing toward goals    Frequency    Min 3X/week      PT Plan Current plan remains appropriate    Co-evaluation              AM-PAC PT "6 Clicks" Daily Activity  Outcome Measure  Difficulty turning over in bed (including adjusting bedclothes, sheets and blankets)?: A Little Difficulty moving from lying on back to sitting on the side of the bed? : A Lot Difficulty sitting down on and standing up from a chair with arms (e.g., wheelchair, bedside commode, etc,.)?: Total Help needed moving to and from a bed to chair (including a wheelchair)?: A Lot Help needed walking in hospital room?: A Little Help needed climbing 3-5 steps with a railing? : A Lot 6 Click Score: 13    End of Session Equipment Utilized During Treatment: Gait belt Activity Tolerance: Patient limited by fatigue Patient left: with call bell/phone within reach;in bed Nurse Communication: Mobility status PT Visit Diagnosis: Unsteadiness on feet (R26.81);Muscle weakness (generalized) (M62.81);Difficulty in walking, not elsewhere classified (R26.2)     Time: 4098-1191 PT Time Calculation (min) (ACUTE ONLY): 36 min  Charges:  $Gait Training: 8-22 mins $Therapeutic Activity: 8-22 mins                    G Codes:       Erline Levine, PTA Pager: [phone]     Carolynne Edouard 05/12/2017, 1:42 PM

## 2017-05-12 NOTE — Progress Notes (Signed)
ANTICOAGULATION CONSULT NOTE - Follow Up Consult  Pharmacy Consult for Warfarin  Indication: HVAD  No Active Allergies  Patient Measurements: Height: 6' (182.9 cm) Weight: 236 lb 15.9 oz (107.5 kg) IBW/kg (Calculated) : 77.6  Vital Signs: Temp: 98.2 F (36.8 C) (07/23 0700) Temp Source: Oral (07/23 0700) BP: 92/69 (07/23 1000) Pulse Rate: 73 (07/23 0800)  Labs:  Recent Labs  05/10/17 0408 05/10/17 1811 05/11/17 0326 05/12/17 0413  HGB 8.1* 8.6* 8.5* 8.7*  HCT 24.8* 26.6* 26.5* 26.9*  PLT 216 246 273 309  LABPROT 26.0*  --  24.4* 25.2*  INR 2.33  --  2.15 2.25  CREATININE 1.64*  --  1.56* 1.65*    Estimated Creatinine Clearance: 64.9 mL/min (A) (by C-G formula based on SCr of 1.65 mg/dL (H)).   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 He developed an ileus on 7/5, warfarin was held, and heparin bridge started once INR fell below 1.8.  Heparin stopped 7/10 with INR 2.19. On 7/13, Hgb dropped and he had melena - warfarin held again and pharmacy asked to start heparin once INR < 1.8 pending GI workup.   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   Warfarin resumed 7/18 without heparin bridge. INR with fast trend up after 2 doses, now back in goal range at 2.25. Hgb 8.5 - has received multiple PRBCs. Diet advanced.  Remains on Amiodarone drip and mexilitine  Goal of Therapy:  INR 2-2.5 Monitor platelets by anticoagulation protocol: Yes   Plan:  1) Warfarin 2 mg x 1 tonight. 2) Daily INR  Leota SauersLisa Emidio Warrell Pharm.D. CPP, BCPS Clinical Pharmacist [phone] 05/12/2017 11:22 AM

## 2017-05-12 NOTE — Progress Notes (Addendum)
Nutrition Follow-up  DOCUMENTATION CODES:   Severe malnutrition in context of chronic illness  INTERVENTION:    Continue Ensure Enlive po daily, each supplement provides 350 kcal and 20 grams of protein   Continue Boost Breeze po BID, each supplement provides 250 kcal and 9 grams of protein  NUTRITION DIAGNOSIS:   Malnutrition (severe) related to chronic illness (CHF) as evidenced by severe depletion of body fat, severe depletion of muscle mass, ongoing  GOAL:   Patient will meet greater than or equal to 90% of their needs, progressing  MONITOR:   PO intake, Supplement acceptance, Labs, Weight trends, Skin, I & O's  ASSESSMENT:   56 yo male with hx of HTN, gout, obesity, chronic edema, CAD, ischemic cardiomyopathy, DM2, AICD, MI, CHF, renal insufficiency who was admitted on 6/22 with acute on chronic systolic heart failure.  6/28  HVAD placement 6/29  Extubated 7/01  Soft diet started, Ensure Enlive added 7/04  Re-intubated 7/05  TPN started 7/06  Extubated 7/11  TPN D/C'd  Pt found to have ileus after vomiting fecal material >> resolved. Advanced to Soft diet 7/22.  Reports a good appetite. PO intake 75% per flowsheet records.   Ensure Enlive supplement ordered daily. Boost Breeze BID. Drinking Ensure Enlive upon visit.  Medications reviewed and include Reglan & Coumadin. Labs reviewed. CBG's [id number]. Palliative Care Team following.  Diet Order:  DIET SOFT Room service appropriate? Yes; Fluid consistency: Thin  Skin:  LLE posterior calf >> severe tophaceous gout, with crystals that are exposed  Last BM:  7/23  Height:   Ht Readings from Last 1 Encounters:  05-09-17 6' (1.829 m)    Weight:   Wt Readings from Last 1 Encounters:  05/12/17 236 lb 15.9 oz (107.5 kg)    Ideal Body Weight:  80.9 kg  BMI:  Body mass index is 32.14 kg/m.  Estimated Nutritional Needs:   Kcal:  2100-2300  Protein:  120-140 gm  Fluid:  2.1-2.3 L  EDUCATION NEEDS:    No education needs identified at this time  Maureen ChattersKatie Aryn Safran, RD, LDN Pager #: [phone] After-Hours Pager #: [phone]

## 2017-05-12 NOTE — Progress Notes (Signed)
Pt's MAP 100-102 automatic cuff. Manual cuff 100. Hydralazine 10mg  PRN given.

## 2017-05-12 NOTE — Progress Notes (Signed)
Patient ID: James [name], male   DOB: 1961/07/20, 56 y.o.   MRN: [id number]  This NP visited patient at the bedside and placed call to James [name]/wife as a follow up for palliative needs and emotional support.  James [name] remains weak, he has had many post op hurdles.  He verbalizes frustration with slow progress.  We practiced deep breathing exercises and spoke to internal prayer.  James [name] remains positive for continued improvement for her husband, she verbalizes the difficulties of balancing life outside the [name] and being there for her husband.   Emotional support offered to [facility] and [facility] [name].   Time in  1200         Time out  1220  Total time spent on the unit was 20 minutes  Greater than 50% of the time was spent in counseling and coordination of care  Lorinda CreedMary Faiz Weber NP  Palliative Medicine Team Team Phone # [phone] Pager [phone]

## 2017-05-12 NOTE — Progress Notes (Signed)
  Speech Language Pathology Treatment: Dysphagia  Patient Details Name: James [name] MRN: [id number] DOB: Oct 07, 1961 Today's Date: 05/12/2017 Time: [id number] SLP Time Calculation (min) (ACUTE ONLY): 9 min  Assessment / Plan / Recommendation Clinical Impression  Pt has been tolerating current diet (soft solids) for 48 hours now with no clinical s/s of aspiration; lungs clear, diminished at bases; afebrile. He reports no difficulty and RN confirms.  Recommend continue current diet.  Per FEES, despite noted backflow of liquids through UES, pt was able to protect airway when drinking/eating with head in neutral position without aspiration.  Will follow briefly for further diet progression/toleration.     HPI HPI: 56 year old male admitted 03-09-2017 for LVAD placement. PMH significant for gout, DM2, HTN, CKD, CAD s/p CABGx6 (2012), systemic heart failure, [employer] ICD. Pt has had 3 intubations this admission.        SLP Plan         Recommendations  Diet recommendations: Dysphagia 3 (mechanical soft);Thin liquid Liquids provided via: Cup;No straw Medication Administration: Whole meds with puree Supervision: Patient able to self feed;Intermittent supervision to cue for compensatory strategies Compensations: Slow rate;Small sips/bites Postural Changes and/or Swallow Maneuvers: Seated upright 90 degrees;Upright 30-60 min after meal                Oral Care Recommendations: Oral care BID        GO                James [name], James [name] 05/12/2017, 4:21 PM

## 2017-05-12 NOTE — Progress Notes (Signed)
This RN observed pt's HVAD flow had trended down to 3.2-3.8 when previously trending 4.6-4.8. Pulsatility unchanged. Power unchanged. Pt in the bed after ambulating in hallway with PT. CVP 7, MAP 95, HR 65-70 SR. VAD coordinator paged to make aware. Follow up assessment after pt more at rest, flow 4-4.5, MAP 91. Hydralazine dose due at 1400 given early.  Will continue to monitor and assess pt close.

## 2017-05-12 NOTE — Progress Notes (Signed)
CSW attempted to visit at bedside although patient unavailable at time of visit. CSW informed via VAD Coordinators that patient making improvements and ambulated the hallway today. CSW will continue to follow for support as needed. Lasandra BeechJackie Jamerica Snavely, LCSW, CCSW-MCS [phone]

## 2017-05-13 DIAGNOSIS — Z95811 Presence of heart assist device: Secondary | ICD-10-CM

## 2017-05-13 DIAGNOSIS — I5023 Acute on chronic systolic (congestive) heart failure: Secondary | ICD-10-CM

## 2017-05-13 LAB — GLUCOSE, CAPILLARY
GLUCOSE-CAPILLARY: 147 mg/dL — AB (ref 65–99)
GLUCOSE-CAPILLARY: 98 mg/dL (ref 65–99)
Glucose-Capillary: 131 mg/dL — ABNORMAL HIGH (ref 65–99)
Glucose-Capillary: 83 mg/dL (ref 65–99)

## 2017-05-13 LAB — BASIC METABOLIC PANEL
Anion gap: 5 (ref 5–15)
BUN: 48 mg/dL — AB (ref 6–20)
CHLORIDE: 111 mmol/L (ref 101–111)
CO2: 19 mmol/L — AB (ref 22–32)
CREATININE: 1.62 mg/dL — AB (ref 0.61–1.24)
Calcium: 7.5 mg/dL — ABNORMAL LOW (ref 8.9–10.3)
GFR calc non Af Amer: 47 mL/min — ABNORMAL LOW (ref >60)
GFR, EST AFRICAN AMERICAN: 54 mL/min — AB (ref >60)
Glucose, Bld: 131 mg/dL — ABNORMAL HIGH (ref 65–99)
Potassium: 3.9 mmol/L (ref 3.5–5.1)
Sodium: 135 mmol/L (ref 135–145)

## 2017-05-13 LAB — CBC
HEMATOCRIT: 25.2 % — AB (ref 39.0–52.0)
HEMOGLOBIN: 8.1 g/dL — AB (ref 13.0–17.0)
MCH: 28.8 pg (ref 26.0–34.0)
MCHC: 32.1 g/dL (ref 30.0–36.0)
MCV: 89.7 fL (ref 78.0–100.0)
Platelets: 284 10*3/uL (ref 150–400)
RBC: 2.81 MIL/uL — ABNORMAL LOW (ref 4.22–5.81)
RDW: 18.4 % — ABNORMAL HIGH (ref 11.5–15.5)
WBC: 9.8 10*3/uL (ref 4.0–10.5)

## 2017-05-13 LAB — PROTIME-INR
INR: 2.11
PROTHROMBIN TIME: 24 s — AB (ref 11.4–15.2)

## 2017-05-13 LAB — COOXEMETRY PANEL
Carboxyhemoglobin: 1.4 % (ref 0.5–1.5)
Methemoglobin: 1.3 % (ref 0.0–1.5)
O2 Saturation: 76.1 %
Total hemoglobin: 8 g/dL — ABNORMAL LOW (ref 12.0–16.0)

## 2017-05-13 LAB — LACTATE DEHYDROGENASE: LDH: 185 U/L (ref 98–192)

## 2017-05-13 MED ORDER — WARFARIN SODIUM 2.5 MG PO TABS
2.5000 mg | ORAL_TABLET | Freq: Once | ORAL | Status: AC
  Administered 2017-05-13: 2.5 mg via ORAL
  Filled 2017-05-13: qty 1

## 2017-05-13 MED ORDER — AMIODARONE HCL 200 MG PO TABS
400.0000 mg | ORAL_TABLET | Freq: Two times a day (BID) | ORAL | Status: DC
  Administered 2017-05-13 – 2017-05-14 (×3): 400 mg via ORAL
  Filled 2017-05-13 (×3): qty 2

## 2017-05-13 MED ORDER — TRAZODONE HCL 50 MG PO TABS
50.0000 mg | ORAL_TABLET | Freq: Every day | ORAL | Status: DC
  Administered 2017-05-13 – 2017-05-16 (×4): 50 mg via ORAL
  Filled 2017-05-13 (×4): qty 1

## 2017-05-13 NOTE — Progress Notes (Signed)
  Speech Language Pathology Treatment: Dysphagia  Patient Details Name: James [name] MRN: [id number] DOB: 04/22/1961 Today's Date: 05/13/2017 Time: [id number] SLP Time Calculation (min) (ACUTE ONLY): 14 min  Assessment / Plan / Recommendation Clinical Impression  Pt continues to report improvements in swallow function, RN and pt deny acute difficulty with mechanical soft and thin liquid diet. SLP assessed pt with thin liquids, without overt s/sx of aspiration. Offered solid PO snack, however pt declined. Baseline diet pt consumed regular solid textures and wishes to progress however states he is not yet ready to try solid POs. Further instrumental testing not indicated at this time as previously reported symtpoms have improved. ST to continue to follow for readiness for diet upgrade.    HPI HPI: 56 year old male admitted 04/13/2017 for LVAD placement. PMH significant for gout, DM2, HTN, CKD, CAD s/p CABGx6 (2012), systemic heart failure, [employer] ICD. Pt has had 3 intubations this admission.        SLP Plan  Continue with current plan of care       Recommendations  Diet recommendations: Dysphagia 3 (mechanical soft);Thin liquid Liquids provided via: Cup;No straw Medication Administration: Whole meds with puree Supervision: Patient able to self feed;Intermittent supervision to cue for compensatory strategies Compensations: Slow rate;Small sips/bites Postural Changes and/or Swallow Maneuvers: Seated upright 90 degrees;Upright 30-60 min after meal                Plan: Continue with current plan of care       GO               Marcene Duoshelsea Sumney MA, CCC-SLP Acute Care Speech Language Pathologist    Kennieth RadChelsea E Sumney 05/13/2017, 3:13 PM

## 2017-05-13 NOTE — Progress Notes (Signed)
LVAD Inpatient Coordinator Rounding Note:  Admitted 04/08/2017 due to A/C Heart failure.   [facility] LVAD implanted on 09/30/17 by Dr. Laneta SimmersBartle as DT VAD.  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 He had epinephrine, APC, and 2 clips.   Vital signs: HR: 68 Doppler:  90 Auto BP:  96/82 (89) O2 Sat: 100% on RA Wt:207>209 > 213 >213 > 208>203>220>227lbs...........>225>223>225>230>234>233>240>240>236>239  LVAD interrogation reveals:  Speed: 2660 Flow: 4.8 Power: 4.1 Alarms:none Peak: 7.3 Trough: 2.4 HCT: 25 Low flow alarm setting: 2.5 High watt alarm setting: 6.5   Suction: on  Lavare cycle: on  Blood Products: 6/28> 5 PRBC's, 6 FFP 7/1> 1 PRBC 7/9> 1 PRBC 7/13>3 PRBC 7/14>3 PRBC 7/15> 1 PRBC 7/16>3PRBC 7/17> 3 PRBC 7/20> 1 PRBC  Gtts: Milrinone restarted 7/8, stopped 7/16 Levo stopped 04/26/17 restarted 05/03/2017 - stopped 05/07/17 Amiodarone stopped 04/29/17, restarted 7/17 continuous at 30 mg/hr-stopping today transitioning to PO Heparin stopped 04/29/17 Lidocaine 05/07/17 - stopped 7/19  TPN - started 04/24/17 for nutritional support, stopped 04/30/17   Arrhythmia: 04/24/17 - Afib with RVR - started amiodarone, now PO 05/02/17 - 2 sustained episodes of VT - cardioverted x 1; second broke with overdrive pacing 7/17- wide complex tachycardia- Amio bolus x3 and gtt at 60 mg/hr 05/07/17 - sustained VT - converted with overdrive pacing; Lidocaine started in addition to amiodarone 05/08/17 - Lidocaine stopped due to confusion (lido level 12.4)  Respiratory: 04/23/17 - re-intubated due to respiratory failure secondary to suspected aspiration pneumonia 04/25/17-extubated 04/23/2017- intubated for EGD 05/17/2017- extubated  Drive Line: Daily Dressing Kits with Aquacell AG silver strips per protocol.   Labs:  LDH trend:160 (pre VAD)>227>215......200>183>161>164>184>195>190>188>187>205>212>185  INR trend:  1.31>1.45>1.44>2.09>2.28>3.58>3.94>3.22>2.17>1.74>1.7>2.24>2.68>2.33>2.15>2.25>2.11  Anticoagulation Plan: -INR Goal: 2-2.5  -ASA Dose: 325 mg- on hold  Adverse Events on VAD: [facility] - 04/23/17 Ileus with vomiting; re-intubation for acute respiratory failure; probable aspiration pneumonia - 7/13-GI bleed - 7/19 - Lidocaine gtt stopped due to confusion (lido toxicity)    Plan/Recommendations:  1. Continue daily drive line dressing changes with patient's wife.  2. Please call VAD pager with patient and equipment concerns. 3. [facility] this [facility].  Carlton AdamSarah Celene Pippins RN, VAD Coordinator 24/7 pager [phone]

## 2017-05-13 NOTE — Progress Notes (Addendum)
Patient ID: James [name], male   DOB: 16-Jun-1961, 56 y.o.   MRN: [id number] \  Advanced Heart Failure VAD Team Note  Subjective:    HVAD placed 6/28.  Returned to the OR that evening with high chest tube output, evacuation of mediastinal hematoma.   Extubated 6/29. Milrinone stopped on 7/4.  Evening of 7/4, patient developed ileus with respiratory compromise. NGT placed with 3 L suctioned out.  CXR with suspicion for aspiration PNA.  Patient had to be intubated.  He went into atrial fibrillation with RVR.  He became hypotensive and was started on norepinephrine and phenylephrine.  Amiodarone gtt begun.     Extubated again on 7/6.   On 7/13 Had 2 sustained episodes of VT. Had to be cardioverted 1. Second episode broke with overdrive pacing with Dr. Graciela Husbands. VAD speed turned down to 2700.   GI bleed --> 2 units PRBCs 7/14, 2 units 7/15. 3 U PRBCs 7/16,  06/02/2017 3 UPRBCs.   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  He had epinephrine, APC, and 2 clips. Intubated prior to procedure and placed on norepinephrine. Speed dropped to 2660.   7/18 Back in VT with overdrive pacing. Lidocaine was started in addition to amiodarone. Extubated.  7/19 lidocaine stopped with confusion. Neuro consulted.  EEG normal.  CT of head no acute findings. 7/20 confusion had resolved.   1 unit PRBCs 7/20.   On 7/22, developed sustained VT with rate around 130 shortly after getting IV Lasix.  We were able to pace him out and back to NSR.  He was put back on amiodarone gtt.   No further VT.  CVP remains 5-6.  Co-ox 76%.  INR 2.1  Hemoglobin mildly lower at 8.1 but had BM without blood or melena.  No dyspnea at rest did better walking yesterday and eating more.  MAP 80s-90s. WBCs lower today.  Not sleeping well.    HVAD INTERROGATION:  HVAD:  Flow 4.6 liters/min, speed 2660,  power 4.1 W,  Peak 7 Trough 3.5. Suction off. Lavare On. No alarms.   Objective:    Vital  Signs:   Temp:  [97.4 F (36.3 C)-98.4 F (36.9 C)] 98.4 F (36.9 C) (07/24 0400) Pulse Rate:  [40-77] 77 (07/24 0600) Resp:  [17-31] 23 (07/24 0600) BP: (90-117)/(68-95) 112/84 (07/24 0600) SpO2:  [97 %-100 %] 100 % (07/24 0600) Weight:  [239 lb (108.4 kg)] 239 lb (108.4 kg) (07/24 0500) Last BM Date: 05/12/17 Mean arterial Pressure 80s-90s  Intake/Output:   Intake/Output Summary (Last 24 hours) at 05/13/17 0736 Last data filed at 05/13/17 0600  Gross per 24 hour  Intake            614.1 ml  Output              475 ml  Net            139.1 ml     Physical Exam  CVP 5-6. Physical Exam: GENERAL: Appears chronically ill. NAD.  HEENT: normal  NECK: Supple, JVP 8. No lymphadenopathy or thyromegaly appreciated.  CARDIAC: Mechanical heart sounds with LVAD hum present.  LUNGS: Decreased at bases.  ABDOMEN: Soft, round, nontender, positive bowel sounds x4.  LVAD exit site: Dressing dry and intact. No erythema or drainage. Stabilization device present and accurately applied. Driveline dressing is being changed daily per sterile technique. EXTREMITIES: Warm and dry, no cyanosis, clubbing, rash.RUE PICC L radial A line. Gouty nodules on fingers.  1+ edema to knees bilaterally.  NEUROLOGIC: Alert and oriented x 3. Gait steady. No aphasia. No dysarthria. Affect pleasant.  GU: Foley yellow urine    Telemetry   Personally reviewed, NSR 60s currently, no VT.   Labs   Basic Metabolic Panel:  Recent Labs Lab 05/07/17 0448 05/08/17 9604 05/09/17 0415 05/10/17 0408 05/11/17 0326 05/12/17 0413 05/13/17 0438  NA 134* 135 137 135 137 135 135  K 4.1 4.2 4.3 4.1 3.9 3.7 3.9  CL 107 109 109 110 112* 109 111  CO2 19* 18* 19* 20* 19* 20* 19*  GLUCOSE 201* 164* 165* 166* 110* 184* 131*  BUN 83* 79* 72* 61* 54* 50* 48*  CREATININE 2.08* 1.95* 1.89* 1.64* 1.56* 1.65* 1.62*  CALCIUM 7.6* 7.8* 7.6* 7.5* 7.6* 7.4* 7.5*  MG 2.0 2.0  --   --  2.0  --   --   PHOS  --   4.7*  --   --   --   --   --     Liver Function Tests:  Recent Labs Lab 05/10/17 0408  AST 19  ALT 12*  ALKPHOS 77  BILITOT 0.7  PROT 4.1*  ALBUMIN 1.6*   No results for input(s): LIPASE, AMYLASE in the last 168 hours.  Recent Labs Lab 05/09/17 1804  AMMONIA 34    CBC:  Recent Labs Lab 05/10/17 0408 05/10/17 1811 05/11/17 0326 05/12/17 0413 05/13/17 0438  WBC 7.1 10.3 11.6* 17.8* 9.8  NEUTROABS  --   --   --  15.6*  --   HGB 8.1* 8.6* 8.5* 8.7* 8.1*  HCT 24.8* 26.6* 26.5* 26.9* 25.2*  MCV 89.9 90.8 89.5 89.7 89.7  PLT 216 246 273 309 284    INR:  Recent Labs Lab 05/09/17 0415 05/10/17 0408 05/11/17 0326 05/12/17 0413 05/13/17 0438  INR 2.68 2.33 2.15 2.25 2.11    Other results:     Imaging   No results found.   Medications:     Scheduled Medications: . Chlorhexidine Gluconate Cloth  6 each Topical Daily  . febuxostat  120 mg Oral Daily  . feeding supplement  1 Container Oral BID BM  . feeding supplement (ENSURE ENLIVE)  237 mL Oral Q24H  . fentaNYL (SUBLIMAZE) injection  50 mcg Intravenous Once  . hydrALAZINE  37.5 mg Oral Q8H  . insulin aspart  0-15 Units Subcutaneous TID WC  . isosorbide mononitrate  30 mg Oral Daily  . mouth rinse  15 mL Mouth Rinse BID  . metoCLOPramide (REGLAN) injection  5 mg Intravenous Q8H  . mexiletine  200 mg Oral Q8H  . pantoprazole (PROTONIX) IV  40 mg Intravenous Q12H  . sodium chloride flush  10-40 mL Intracatheter Q12H  . Warfarin - Pharmacist Dosing Inpatient   Does not apply q1800    Infusions: . sodium chloride Stopped (04/19/17 1200)  . sodium chloride 10 mL/hr at 05/11/17 1800  . sodium chloride Stopped (05/02/17 0630)  . sodium chloride 10 mL/hr at 05/12/17 1900  . amiodarone 30 mg/hr (05/13/17 0725)  . fentaNYL infusion INTRAVENOUS 100 mcg/hr (05/07/17 1600)  . lactated ringers Stopped (05/01/17 0810)  . norepinephrine (LEVOPHED) Adult infusion Stopped (05/09/17 1200)  . phenylephrine  (NEO-SYNEPHRINE) Adult infusion Stopped (05/02/17 2000)    PRN Medications: albuterol, docusate, fentaNYL, hydrALAZINE, neomycin-bacitracin-polymyxin, ondansetron (ZOFRAN) IV, oxyCODONE, phenol, sodium chloride flush, traMADol   Patient Profile   56 yo with CAD s/p CABG, ischemic cardiomyopathy/chronic systolic CHF, tophaceous gout, and CKD stage 3 was admitted  for diuresis and consideration for LVAD placement. S/p HVAD on 6/28  Assessment/Plan:    1. Acute/chronic systolic CHF s/p HVAD: Ischemic cardiomyopathy.  St Jude ICD.  Echo (6/18) with EF 15%, mildly dilated RV with moderately decreased systolic function.  s/p HVAD placement 6/28 and had to return to OR to evacuate mediastinal hematoma.  He had been weaned off pressors/milrinone, but developed ileus w/ likely aspiration event 7/4, re-intubated, developed afib/RVR requiring norepinephrine. Extubated 7/6.  VAD speed turned down to 2660 with VT. VAD parameters currently look good.  Milrinone stopped 7/17 early am with recurrent VT.  Now off norepinephrine. No diuretic yesterday.  Today, CVP 5-6 and co-ox 76%.  MAP 80s-90s.   - No Lasix today with low CVP.  Abrupt fluid shifts seem to trigger VT, last episode of VT occurred about 30 minutes after IV Lasix.  Eventually will need po diuretic most likely.  - Off ASA. Back on warfarin, INR 2.1 => goal now is 2-2.5.   - No dig/spironolactone/arb with elevated creatinine.    - MAP still high at times, increase hydralazine to 50 mg tid.  2. AKI on CKD stage 3: Suspect a component of peri-op ATN, worsened with ileus, vomiting, intubation/sedation.  Creatinine stable at 1.6. 3. Symptomatic anemia due to acute UGI bleeding:  Received 3 units PRBCs 7/16 and 3 units PRBCs on May 28, 2017. 1 unit PRBCs 7/20. 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  Requiring 2 clips + epi + APC. Does not appear to be bleeding actively now, BM yesterday did not suggest bleeding.  Hemoglobin  8.1 today. - Continue IV Protonix - Back on  warfarin, aiming for INR 2-2.5 range. Will leave off aspirin for now.  - Transfuse hgb < 8.  4. Ventricular tachycardia: Status post ventricular tachycardia 2 on 7/13. Possible suction event. VAD speed turned down to 2700.  Recurrent VT 7/17. EP called to bedside. Overdrive pacing successful for about 10 minutes but VT recurred.  Milrinone turned off, remained in VT overnight but hemodynamically stable.  Speed decreased to 2660.  On 7/18, we were able to pace him out of VT.  Lidocaine added then stopped due to confusion/mental status changes.  He had VT 7/22 about 30 minutes after getting IV Lasix, had to be paced out again => may have been due to rapid fluid shift.   - No Lasix today with CVP 5-6. Will try to avoid IV Lasix in future (use po).  - Change amiodarone to 400 mg po bid.  - Off lidocaine with confusion. Now on mexiletine.  5. CAD: s/p CABG 2012. No S/S ischemia.  Off aspirin due to GI bleeding. 6. Gout: Severe tophaceous gout. No complaint of gouty pain today.  - Continue uloric.  7. Atrial fibrillation: Developed atrial fibrillation with RVR in setting of aspiration PNA on 7/4.  Now maintaining NSR.  Continue amiodarone.   8. Malnutrition: Now off TPN. Per Speech- FEEs performed. Continue full liquids. ST to continue to follow.  Encourage po intake, doing better. 9. Aspiration PNA with acute hypoxemic respiratory failure on 7/4: He was extubated on 7/6. Tracheal aspirate with Klebsiella and citrobacter. Both sensitive to Zosyn.  - Completed abx 7/13  10. Ileus: Resolved. Getting Reglan.  11. Deconditioning:  Continue PT, will need CIR.     12. Acute Respiratory Failure: Intubated 7/17 in setting of complex GI intervention. Extubated 7/18. On 4 liters sats stable.  13. Delirium: Resolved. Possibly related to lidocaine.  Lidocaine stopped. Confusion resolved. CT of head negative. EEG ok. No further workup per neuro. No change.  14. ID: WBCs  lower today, no evidence for infection.  15. Insomnia: Add trazodone prn.   Mobilize and encourage nutrition.  I reviewed the HVAD parameters from today, and compared the results to the patient's prior recorded data.  No programming changes were made.  The HVAD is functioning within specified parameters.     Marca Anconaalton McLean, MD 05/13/2017, 7:36 AM  VAD Team --- VAD ISSUES ONLY--- Pager [phone] (7am - 7am) Advanced Heart Failure Team  Pager [phone] (M-F; 7a - 4p)  Please contact [facility] for night-coverage after hours (4p -7a ) and weekends on [email or web address]

## 2017-05-13 NOTE — Progress Notes (Signed)
ANTICOAGULATION CONSULT NOTE - Follow Up Consult  Pharmacy Consult for Warfarin  Indication: HVAD  No Active Allergies  Patient Measurements: Height: 6' (182.9 cm) Weight: 239 lb (108.4 kg) IBW/kg (Calculated) : 77.6  Vital Signs: Temp: 97.3 F (36.3 C) (07/24 0800) Temp Source: Oral (07/24 0800) BP: 112/84 (07/24 0600) Pulse Rate: 77 (07/24 0600)  Labs:  Recent Labs  05/11/17 0326 05/12/17 0413 05/13/17 0438  HGB 8.5* 8.7* 8.1*  HCT 26.5* 26.9* 25.2*  PLT 273 309 284  LABPROT 24.4* 25.2* 24.0*  INR 2.15 2.25 2.11  CREATININE 1.56* 1.65* 1.62*    Estimated Creatinine Clearance: 66.3 mL/min (A) (by C-G formula based on SCr of 1.62 mg/dL (H)).   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 He developed an ileus on 7/5, warfarin was held, and heparin bridge started once INR fell below 1.8.  Heparin stopped 7/10 with INR 2.19. On 7/13, Hgb dropped and he had melena - warfarin held again and pharmacy asked to start heparin once INR < 1.8 pending GI workup.   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   Warfarin resumed 7/18 without heparin bridge. INR with fast trend up after 2 doses, now back in goal range at 2.11. Hgb 8.1 low stable - has received multiple PRBCs. Diet advanced.  Remains on Amiodarone drip> po and mexilitine for VT  Goal of Therapy:  INR 2-2.5 Monitor platelets by anticoagulation protocol: Yes   Plan:  1) Warfarin 2.5 mg x 1 tonight. 2) Daily INR  Leota SauersLisa Ester Hilley Pharm.D. CPP, BCPS Clinical Pharmacist [phone] 05/13/2017 8:14 AM

## 2017-05-13 NOTE — Progress Notes (Signed)
Patient ID: James FabianHarry D [name], male   DOB: 1960-10-29, 56 y.o.   MRN: [id number]  HeartMate 3 Rounding Note  Subjective:    He has had a fairly good day. Diet advanced by ST. Ambulated twice. Bowels working and no obvious blood.  Co-ox 76 this am  LVAD INTERROGATION:  HeartMate II LVAD:  Flow 4.4 liters/min, speed 2660, power 4,   Objective:    Vital Signs:   Temp:  [97.3 F (36.3 C)-98.5 F (36.9 C)] 98.5 F (36.9 C) (07/24 1943) Pulse Rate:  [56-77] 71 (07/24 2200) Resp:  [21-29] 22 (07/24 2200) BP: (90-112)/(68-84) 98/80 (07/24 2000) SpO2:  [100 %] 100 % (07/24 2200) Weight:  [108.4 kg (239 lb)] 108.4 kg (239 lb) (07/24 0500) Last BM Date: 05/13/17 Mean arterial Pressure 80's  CVP 8  Intake/Output:   Intake/Output Summary (Last 24 hours) at 05/13/17 2224 Last data filed at 05/13/17 2200  Gross per 24 hour  Intake            913.6 ml  Output              625 ml  Net            288.6 ml     Physical Exam: General:  Better spirits,  No resp difficulty HEENT: normal Cor: LVAD hum present. Lungs: clear Abdomen: soft, nontender, nondistended. Good bowel sounds. Extremities: mild edema Neuro: alert & orientedx3, cranial nerves grossly intact. moves all 4 extremities w/o difficulty. Affect pleasant  Telemetry: sinus 70's  Labs: Basic Metabolic Panel:  Recent Labs Lab 05/07/17 0448 05/08/17 0339 05/09/17 0415 05/10/17 0408 05/11/17 0326 05/12/17 0413 05/13/17 0438  NA 134* 135 137 135 137 135 135  K 4.1 4.2 4.3 4.1 3.9 3.7 3.9  CL 107 109 109 110 112* 109 111  CO2 19* 18* 19* 20* 19* 20* 19*  GLUCOSE 201* 164* 165* 166* 110* 184* 131*  BUN 83* 79* 72* 61* 54* 50* 48*  CREATININE 2.08* 1.95* 1.89* 1.64* 1.56* 1.65* 1.62*  CALCIUM 7.6* 7.8* 7.6* 7.5* 7.6* 7.4* 7.5*  MG 2.0 2.0  --   --  2.0  --   --   PHOS  --  4.7*  --   --   --   --   --     Liver Function Tests:  Recent Labs Lab 05/10/17 0408  AST 19  ALT 12*  ALKPHOS 77  BILITOT 0.7  PROT 4.1*   ALBUMIN 1.6*   No results for input(s): LIPASE, AMYLASE in the last 168 hours.  Recent Labs Lab 05/09/17 1804  AMMONIA 34    CBC:  Recent Labs Lab 05/10/17 0408 05/10/17 1811 05/11/17 0326 05/12/17 0413 05/13/17 0438  WBC 7.1 10.3 11.6* 17.8* 9.8  NEUTROABS  --   --   --  15.6*  --   HGB 8.1* 8.6* 8.5* 8.7* 8.1*  HCT 24.8* 26.6* 26.5* 26.9* 25.2*  MCV 89.9 90.8 89.5 89.7 89.7  PLT 216 246 273 309 284    INR:  Recent Labs Lab 05/09/17 0415 05/10/17 0408 05/11/17 0326 05/12/17 0413 05/13/17 0438  INR 2.68 2.33 2.15 2.25 2.11   LDH 185  Other results:  EKG:   Imaging:  No results found.   Medications:     Scheduled Medications: . amiodarone  400 mg Oral BID  . Chlorhexidine Gluconate Cloth  6 each Topical Daily  . febuxostat  120 mg Oral Daily  . feeding supplement  1 Container Oral BID BM  .  feeding supplement (ENSURE ENLIVE)  237 mL Oral Q24H  . fentaNYL (SUBLIMAZE) injection  50 mcg Intravenous Once  . hydrALAZINE  37.5 mg Oral Q8H  . insulin aspart  0-15 Units Subcutaneous TID WC  . isosorbide mononitrate  30 mg Oral Daily  . metoCLOPramide (REGLAN) injection  5 mg Intravenous Q8H  . mexiletine  200 mg Oral Q8H  . pantoprazole (PROTONIX) IV  40 mg Intravenous Q12H  . sodium chloride flush  10-40 mL Intracatheter Q12H  . traZODone  50 mg Oral QHS  . Warfarin - Pharmacist Dosing Inpatient   Does not apply q1800     Infusions: . sodium chloride Stopped (04/19/17 1200)  . sodium chloride 10 mL/hr (05/13/17 0948)  . sodium chloride Stopped (05/02/17 0630)  . sodium chloride 10 mL/hr at 05/13/17 2000  . fentaNYL infusion INTRAVENOUS 100 mcg/hr (05/07/17 1600)  . lactated ringers Stopped (05/01/17 0810)  . norepinephrine (LEVOPHED) Adult infusion Stopped (05/09/17 1200)  . phenylephrine (NEO-SYNEPHRINE) Adult infusion Stopped (05/02/17 2000)     PRN Medications:  albuterol, docusate, fentaNYL, hydrALAZINE, neomycin-bacitracin-polymyxin,  ondansetron (ZOFRAN) IV, oxyCODONE, phenol, sodium chloride flush, traMADol  Assessment and Plan:  POD 26s/p HVAD for ischemic cardiomyopathy with acute on chronic systolic heart failure, EF 15% with moderate RV dysfunction. Returned to OR for bleeding from raw mediastinal tissues.  He has been hemodynamically stable off inotropes.  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 Continuing IV protonix and will maintain INR 2-2.5 without ASA.  Acute respiratory failure due to aspiration pneumonia resolved.  Ileus resolved  INR therapeutic at 2.11. Pharmacy is managing    DM: preop Hgb A1c 5.9. Glucose under good control. ContinueLevemir and SSI.  Gout: observe  Hx of arrhythmias preop on amio and postop sustained VTpossibly due to suction events with diuresis and volume shifts. Stable in sinus on amio and mexiletine. ICD in place but will wait to turn on therapies.   Severe malnutrition with prealbumin <5: Encourage PO intake  Physical therapy.   I reviewed the LVAD parameters from today, and compared the results to the patient's prior recorded data. LVAD equipment check completed andis in good working order. Back-up equipment present.     Length of Stay: [address]  Payton Doughty [facility] 05/13/2017

## 2017-05-13 NOTE — Plan of Care (Signed)
Problem: Nutrition: Goal: Adequate nutrition will be maintained Outcome: Progressing Patient ate a cheeseburger for dinner today.  Appetite is improving.

## 2017-05-13 NOTE — Progress Notes (Signed)
Inpatient Rehabilitation  Continuing to follow along for timing of anticipated medical readiness, insurance authorization, and IP Rehab bed availability.  Please call with questions.    Eddis Pingleton, M.A., CCC/SLP Admission Coordinator  [facility]  Cell [phone] 

## 2017-05-13 NOTE — Plan of Care (Signed)
Problem: Activity: Goal: Risk for activity intolerance will decrease Outcome: Progressing Patient walked x2 today and is getting stronger!

## 2017-05-13 NOTE — Progress Notes (Signed)
VAD coordinator changed pt's drive line exit site dressing. Existing VAD dressing removed and site care performed using sterile technique. Drive line exit site cleaned with Chlora prep applicators x 2, allowed to dry, and gauze  dressing with aquacel silver strip re-applied. Exit site with minimal tissue ingrowth, distal suture remains intact, the velour is fully implanted at exit site. Small amount light tan drainage noted on dressing; no redness, tenderness,  foul odor, or rash noted. Drive line anchor re-applied.   Reviewed controller connections, how to scroll through VAD parameters, alarm silence button and how/when to use. Pt verbalized understanding of same.

## 2017-05-14 LAB — CBC
HEMATOCRIT: 25.1 % — AB (ref 39.0–52.0)
HEMATOCRIT: 28.2 % — AB (ref 39.0–52.0)
HEMOGLOBIN: 8 g/dL — AB (ref 13.0–17.0)
HEMOGLOBIN: 8.8 g/dL — AB (ref 13.0–17.0)
MCH: 29.1 pg (ref 26.0–34.0)
MCH: 28.3 pg (ref 26.0–34.0)
MCHC: 31.9 g/dL (ref 30.0–36.0)
MCHC: 31.2 g/dL (ref 30.0–36.0)
MCV: 91.3 fL (ref 78.0–100.0)
MCV: 90.7 fL (ref 78.0–100.0)
Platelets: 297 10*3/uL (ref 150–400)
Platelets: 346 10*3/uL (ref 150–400)
RBC: 2.75 MIL/uL — ABNORMAL LOW (ref 4.22–5.81)
RBC: 3.11 MIL/uL — AB (ref 4.22–5.81)
RDW: 18.7 % — AB (ref 11.5–15.5)
RDW: 18.3 % — ABNORMAL HIGH (ref 11.5–15.5)
WBC: 6.8 10*3/uL (ref 4.0–10.5)
WBC: 7.2 10*3/uL (ref 4.0–10.5)

## 2017-05-14 LAB — COOXEMETRY PANEL
CARBOXYHEMOGLOBIN: 1.6 % — AB (ref 0.5–1.5)
Methemoglobin: 1.1 % (ref 0.0–1.5)
O2 Saturation: 82.6 %
TOTAL HEMOGLOBIN: 8 g/dL — AB (ref 12.0–16.0)

## 2017-05-14 LAB — BASIC METABOLIC PANEL
ANION GAP: 5 (ref 5–15)
Anion gap: 5 (ref 5–15)
BUN: 45 mg/dL — AB (ref 6–20)
BUN: 43 mg/dL — ABNORMAL HIGH (ref 6–20)
CHLORIDE: 111 mmol/L (ref 101–111)
CO2: 20 mmol/L — AB (ref 22–32)
CO2: 19 mmol/L — AB (ref 22–32)
CREATININE: 1.62 mg/dL — AB (ref 0.61–1.24)
Calcium: 7.6 mg/dL — ABNORMAL LOW (ref 8.9–10.3)
Calcium: 7.7 mg/dL — ABNORMAL LOW (ref 8.9–10.3)
Chloride: 112 mmol/L — ABNORMAL HIGH (ref 101–111)
Creatinine, Ser: 1.55 mg/dL — ABNORMAL HIGH (ref 0.61–1.24)
GFR calc Af Amer: 54 mL/min — ABNORMAL LOW (ref >60)
GFR calc Af Amer: 57 mL/min — ABNORMAL LOW (ref >60)
GFR calc non Af Amer: 47 mL/min — ABNORMAL LOW (ref >60)
GFR calc non Af Amer: 49 mL/min — ABNORMAL LOW (ref >60)
GLUCOSE: 105 mg/dL — AB (ref 65–99)
Glucose, Bld: 87 mg/dL (ref 65–99)
POTASSIUM: 3.7 mmol/L (ref 3.5–5.1)
Potassium: 3.8 mmol/L (ref 3.5–5.1)
SODIUM: 136 mmol/L (ref 135–145)
Sodium: 136 mmol/L (ref 135–145)

## 2017-05-14 LAB — MAGNESIUM: Magnesium: 1.8 mg/dL (ref 1.7–2.4)

## 2017-05-14 LAB — GLUCOSE, CAPILLARY
GLUCOSE-CAPILLARY: 109 mg/dL — AB (ref 65–99)
GLUCOSE-CAPILLARY: 151 mg/dL — AB (ref 65–99)
Glucose-Capillary: 81 mg/dL (ref 65–99)
Glucose-Capillary: 131 mg/dL — ABNORMAL HIGH (ref 65–99)

## 2017-05-14 LAB — LACTATE DEHYDROGENASE: LDH: 167 U/L (ref 98–192)

## 2017-05-14 LAB — PROTIME-INR
INR: 2.13
PROTHROMBIN TIME: 24.2 s — AB (ref 11.4–15.2)

## 2017-05-14 MED ORDER — AMIODARONE IV BOLUS ONLY 150 MG/100ML
150.0000 mg | Freq: Once | INTRAVENOUS | Status: AC
  Administered 2017-05-14: 150 mg via INTRAVENOUS
  Filled 2017-05-14: qty 100

## 2017-05-14 MED ORDER — AMIODARONE HCL IN DEXTROSE 360-4.14 MG/200ML-% IV SOLN
INTRAVENOUS | Status: AC
  Administered 2017-05-14: 60 mg/h via INTRAVENOUS
  Filled 2017-05-14: qty 200

## 2017-05-14 MED ORDER — PANTOPRAZOLE SODIUM 40 MG PO TBEC
40.0000 mg | DELAYED_RELEASE_TABLET | Freq: Two times a day (BID) | ORAL | Status: DC
  Administered 2017-05-14 – 2017-05-28 (×28): 40 mg via ORAL
  Filled 2017-05-14 (×28): qty 1

## 2017-05-14 MED ORDER — AMIODARONE HCL IN DEXTROSE 360-4.14 MG/200ML-% IV SOLN
60.0000 mg/h | INTRAVENOUS | Status: DC
  Filled 2017-05-14: qty 200

## 2017-05-14 MED ORDER — AMIODARONE HCL IN DEXTROSE 360-4.14 MG/200ML-% IV SOLN
30.0000 mg/h | INTRAVENOUS | Status: DC
  Filled 2017-05-14 (×3): qty 200

## 2017-05-14 MED ORDER — WARFARIN SODIUM 2.5 MG PO TABS
2.5000 mg | ORAL_TABLET | Freq: Once | ORAL | Status: AC
  Administered 2017-05-14: 2.5 mg via ORAL
  Filled 2017-05-14: qty 1

## 2017-05-14 MED ORDER — AMIODARONE LOAD VIA INFUSION
150.0000 mg | Freq: Once | INTRAVENOUS | Status: AC
  Administered 2017-05-14: 150 mg via INTRAVENOUS
  Filled 2017-05-14: qty 83.34

## 2017-05-14 MED ORDER — MAGNESIUM SULFATE 4 GM/100ML IV SOLN
INTRAVENOUS | Status: AC
  Filled 2017-05-14: qty 100

## 2017-05-14 MED ORDER — MEXILETINE HCL 150 MG PO CAPS
300.0000 mg | ORAL_CAPSULE | Freq: Three times a day (TID) | ORAL | Status: DC
  Administered 2017-05-14 – 2017-05-27 (×39): 300 mg via ORAL
  Filled 2017-05-14 (×42): qty 2

## 2017-05-14 MED ORDER — AMIODARONE LOAD VIA INFUSION
150.0000 mg | Freq: Once | INTRAVENOUS | Status: DC
  Filled 2017-05-14: qty 83.34

## 2017-05-14 MED ORDER — AMIODARONE HCL 200 MG PO TABS
400.0000 mg | ORAL_TABLET | Freq: Three times a day (TID) | ORAL | Status: DC
  Administered 2017-05-14: 400 mg via ORAL
  Filled 2017-05-14: qty 2

## 2017-05-14 MED ORDER — AMIODARONE HCL IN DEXTROSE 360-4.14 MG/200ML-% IV SOLN
60.0000 mg/h | INTRAVENOUS | Status: AC
  Administered 2017-05-14 (×2): 60 mg/h via INTRAVENOUS

## 2017-05-14 MED ORDER — METOCLOPRAMIDE HCL 5 MG PO TABS
5.0000 mg | ORAL_TABLET | Freq: Three times a day (TID) | ORAL | Status: DC
  Administered 2017-05-14 – 2017-05-19 (×16): 5 mg via ORAL
  Filled 2017-05-14 (×17): qty 1

## 2017-05-14 MED ORDER — POTASSIUM CHLORIDE CRYS ER 20 MEQ PO TBCR
40.0000 meq | EXTENDED_RELEASE_TABLET | Freq: Once | ORAL | Status: AC
Start: 2017-05-14 — End: 2017-05-14
  Administered 2017-05-14: 40 meq via ORAL
  Filled 2017-05-14: qty 2

## 2017-05-14 MED ORDER — AMIODARONE HCL IN DEXTROSE 360-4.14 MG/200ML-% IV SOLN
30.0000 mg/h | INTRAVENOUS | Status: DC
  Administered 2017-05-15 (×3): 30 mg/h via INTRAVENOUS

## 2017-05-14 MED ORDER — MAGNESIUM SULFATE 2 GM/50ML IV SOLN
2.0000 g | Freq: Once | INTRAVENOUS | Status: AC
  Administered 2017-05-14: 2 g via INTRAVENOUS
  Filled 2017-05-14: qty 50

## 2017-05-14 NOTE — Progress Notes (Signed)
Patient ID: James [name], male   DOB: [birth year], 56 y.o.   MRN: [id number] \  Advanced Heart Failure VAD Team Note  Subjective:    HVAD placed 6/28.  Returned to the OR that evening with high chest tube output, evacuation of mediastinal hematoma.   Extubated 6/29. Milrinone stopped on 7/4.  Evening of 7/4, patient developed ileus with respiratory compromise. NGT placed with 3 L suctioned out.  CXR with suspicion for aspiration PNA.  Patient had to be intubated.  He went into atrial fibrillation with RVR.  He became hypotensive and was started on norepinephrine and phenylephrine.  Amiodarone gtt begun.     Extubated again on 7/6.   On 7/13 Had 2 sustained episodes of VT. Had to be cardioverted 1. Second episode broke with overdrive pacing with Dr. Graciela HusbandsKlein. VAD speed turned down to 2700.   GI bleed --> 2 units PRBCs 7/14, 2 units 7/15. 3 U PRBCs 7/16,  05/10/2017 3 UPRBCs.   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  He had epinephrine, APC, and 2 clips. Intubated prior to procedure and placed on norepinephrine. Speed dropped to 2660.   7/18 Back in VT with overdrive pacing. Lidocaine was started in addition to amiodarone. Extubated.  7/19 lidocaine stopped with confusion. Neuro consulted.  EEG normal.  CT of head no acute findings. 7/20 confusion had resolved.   1 unit PRBCs 7/20.   On 7/22, developed sustained VT with rate around 130 shortly after getting IV Lasix.  We were able to pace him out and back to NSR.  He was put back on amiodarone gtt.   CVP 5-6. No further VT. Co-ox 82.6%. INR 2.13. Hgb 8.0. No bleeding or melena. No dyspnea at rest. Improving with walking. MAPs stable. WBC WNL now.   HVAD INTERROGATION:  HVAD:  Flow 4.6liters/min, speed 2660,  power 4.1 W,  Peak 7 Trough 2.5. Suction On. Lavare On. No alarms.   Objective:    Vital Signs:   Temp:  [97.5 F (36.4 C)-99.2 F (37.3 C)] 98 F (36.7 C) (07/25 0700) Pulse  Rate:  [56-78] 71 (07/25 0800) Resp:  [22-31] 24 (07/25 0800) BP: (90-108)/(70-82) 90/76 (07/25 0400) SpO2:  [98 %-100 %] 100 % (07/25 0800) Weight:  [238 lb 8.6 oz (108.2 kg)] 238 lb 8.6 oz (108.2 kg) (07/25 0500) Last BM Date: 05/13/17 Mean arterial Pressure 80s  Intake/Output:   Intake/Output Summary (Last 24 hours) at 05/14/17 0859 Last data filed at 05/14/17 0700  Gross per 24 hour  Intake              830 ml  Output              675 ml  Net              155 ml     Physical Exam  CVP 5-6  Physical Exam:  GENERAL: Well appearing this am. NAD.  HEENT: Normal. NECK: Supple, JVP ~6-7 cm. Carotids OK.  CARDIAC:  Mechanical heart sounds with LVAD hum present.  LUNGS:  CTAB, normal effort.  ABDOMEN:  NT, ND, no HSM. No bruits or masses. +BS  LVAD exit site: Well-healed and incorporated. Dressing dry and intact. No erythema or drainage. Stabilization device present and accurately applied. Driveline dressing changed daily per sterile technique. EXTREMITIES:  Warm and dry. No cyanosis, clubbing, or rash. RUE PICC L radial A line. Chronic extensive gouty changes on bilateral hands. 1+ edema to  knees bilaterally.  NEUROLOGIC:  Alert & oriented x 3. Cranial nerves grossly intact. Moves all 4 extremities w/o difficulty. Affect pleasant    GU: Foley yellow urine  Telemetry   Personally reviewed, NSR 60s. No VT.    Labs   Basic Metabolic Panel:  Recent Labs Lab 05/08/17 0339  05/10/17 0408 05/11/17 0326 05/12/17 0413 05/13/17 0438 05/14/17 0528  NA 135  < > 135 137 135 135 136  K 4.2  < > 4.1 3.9 3.7 3.9 3.8  CL 109  < > 110 112* 109 111 111  CO2 18*  < > 20* 19* 20* 19* 20*  GLUCOSE 164*  < > 166* 110* 184* 131* 87  BUN 79*  < > 61* 54* 50* 48* 45*  CREATININE 1.95*  < > 1.64* 1.56* 1.65* 1.62* 1.62*  CALCIUM 7.8*  < > 7.5* 7.6* 7.4* 7.5* 7.6*  MG 2.0  --   --  2.0  --   --   --   PHOS 4.7*  --   --   --   --   --   --   < > = values in this interval not  displayed.  Liver Function Tests:  Recent Labs Lab 05/10/17 0408  AST 19  ALT 12*  ALKPHOS 77  BILITOT 0.7  PROT 4.1*  ALBUMIN 1.6*   No results for input(s): LIPASE, AMYLASE in the last 168 hours.  Recent Labs Lab 05/09/17 1804  AMMONIA 34    CBC:  Recent Labs Lab 05/10/17 1811 05/11/17 0326 05/12/17 0413 05/13/17 0438 05/14/17 0528  WBC 10.3 11.6* 17.8* 9.8 6.8  NEUTROABS  --   --  15.6*  --   --   HGB 8.6* 8.5* 8.7* 8.1* 8.0*  HCT 26.6* 26.5* 26.9* 25.2* 25.1*  MCV 90.8 89.5 89.7 89.7 91.3  PLT 246 273 309 284 297    INR:  Recent Labs Lab 05/10/17 0408 05/11/17 0326 05/12/17 0413 05/13/17 0438 05/14/17 0528  INR 2.33 2.15 2.25 2.11 2.13    Other results:     Imaging   No results found.   Medications:     Scheduled Medications: . amiodarone  400 mg Oral BID  . Chlorhexidine Gluconate Cloth  6 each Topical Daily  . febuxostat  120 mg Oral Daily  . feeding supplement  1 Container Oral BID BM  . feeding supplement (ENSURE ENLIVE)  237 mL Oral Q24H  . fentaNYL (SUBLIMAZE) injection  50 mcg Intravenous Once  . hydrALAZINE  37.5 mg Oral Q8H  . insulin aspart  0-15 Units Subcutaneous TID WC  . isosorbide mononitrate  30 mg Oral Daily  . metoCLOPramide (REGLAN) injection  5 mg Intravenous Q8H  . mexiletine  200 mg Oral Q8H  . pantoprazole (PROTONIX) IV  40 mg Intravenous Q12H  . sodium chloride flush  10-40 mL Intracatheter Q12H  . traZODone  50 mg Oral QHS  . Warfarin - Pharmacist Dosing Inpatient   Does not apply q1800    Infusions: . sodium chloride Stopped (04/19/17 1200)  . sodium chloride 10 mL/hr (05/13/17 0948)  . sodium chloride Stopped (05/02/17 0630)  . sodium chloride 10 mL/hr at 05/14/17 0400  . fentaNYL infusion INTRAVENOUS 100 mcg/hr (05/07/17 1600)  . lactated ringers Stopped (05/01/17 0810)  . norepinephrine (LEVOPHED) Adult infusion Stopped (05/09/17 1200)  . phenylephrine (NEO-SYNEPHRINE) Adult infusion Stopped  (05/02/17 2000)    PRN Medications: albuterol, docusate, fentaNYL, hydrALAZINE, neomycin-bacitracin-polymyxin, ondansetron (ZOFRAN) IV, oxyCODONE, phenol, sodium chloride flush,  traMADol   Patient Profile   56 yo with CAD s/p CABG, ischemic cardiomyopathy/chronic systolic CHF, tophaceous gout, and CKD stage 3 was admitted for diuresis and consideration for LVAD placement. S/p HVAD on 6/28  Assessment/Plan:    1. Acute/chronic systolic CHF s/p HVAD: Ischemic cardiomyopathy.  St Jude ICD.  Echo (6/18) with EF 15%, mildly dilated RV with moderately decreased systolic function.  s/p HVAD placement 6/28 and had to return to OR to evacuate mediastinal hematoma.  He had been weaned off pressors/milrinone, but developed ileus w/ likely aspiration event 7/4, re-intubated, developed afib/RVR requiring norepinephrine. Extubated 7/6.  VAD speed turned down to 2660 with VT. VAD parameters currently look good.  Milrinone stopped 7/17 early am with recurrent VT.  Now off norepinephrine. No diuretic again yesterday.  - CVP remains 5-6. Coox 82%. MAPs in 80s  - Holding lasix with lower CVP and to avoid abrupt fluid shifts with VT.  - Off ASA. Back on warfarin, INR 2.13 => goal now is 2-2.5.  No further bleeding.  - No dig/spironolactone/arb with elevated creatinine.    - MAP in 80s with increase of hydralazine to 50 mg tid.  2. AKI on CKD stage 3: Suspect a component of peri-op ATN, worsened with ileus, vomiting, intubation/sedation.  Creatinine stable at 1.6.  3. Symptomatic anemia due to acute UGI bleeding:  Received 3 units PRBCs 7/16 and 3 units PRBCs on 04/29/2017. 1 unit PRBCs 7/20. 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  Requiring 2 clips + epi + APC. Does not appear to be bleeding actively now, BM yesterday did not suggest bleeding.  Hemoglobin 8.0 today.  - Continue IV Protonix - Back on  warfarin, aiming for INR 2-2.5 range. Will leave off aspirin for now.  -  Transfuse hgb < 8. 8.0 this am. No bleeding. Continue to follow.  4. Ventricular tachycardia: Status post ventricular tachycardia 2 on 7/13. Possible suction event. VAD speed turned down to 2700.  Recurrent VT 7/17. EP called to bedside. Overdrive pacing successful for about 10 minutes but VT recurred.  Milrinone turned off, remained in VT overnight but hemodynamically stable.  Speed decreased to 2660.  On 7/18, we were able to pace him out of VT.  Lidocaine added then stopped due to confusion/mental status changes.  He had VT 7/22 about 30 minutes after getting IV Lasix, had to be paced out again => may have been due to rapid fluid shift.   - No Lasix today with CVP 5-6. Will try to avoid IV Lasix in future (use po). Went into VT 30 minutes after IV lasix at last attempt.  - Continue amiodarone 400 mg po bid.  - Off lidocaine with confusion. Now on mexiletine.  5. CAD: s/p CABG 2012. No S/S ischemia.  Off aspirin due to GI bleeding. 6. Gout: Severe tophaceous gout. No complaint of gouty pain today.  - Continue uloric.  7. Atrial fibrillation: Developed atrial fibrillation with RVR in setting of aspiration PNA on 7/4.  Now maintaining NSR.   - Continue amiodarone 400 mg BID po.   8. Malnutrition: Now off TPN. Per Speech- FEEs performed. Continue full liquids. ST to continue to follow.  Encourage po intake, doing better. No change.  9. Aspiration PNA with acute hypoxemic respiratory failure on 7/4: He was extubated on 7/6. Tracheal aspirate with Klebsiella and citrobacter. Both sensitive to Zosyn.  - Completed abx 7/13. No change.  10. Ileus: Resolved. Getting Reglan.  11.  Deconditioning:  Continue PT. Plan for CIR.  12. Acute Respiratory Failure: Intubated 7/17 in setting of complex GI intervention. Extubated 7/18. On 4 liters sats stable.  13. Delirium: Resolved. Appreciate neuro input Possibly related to lidocaine.  Lidocaine stopped. Confusion resolved. CT of head negative. EEG ok. No further  workup per neuro.  14. ID: WBCs WNL today. No evidence for infection.  15. Insomnia: Add trazodone prn. No change.   Mobilize and encourage nutrition. Getting close to CIR. Will discuss timing with MD.   I reviewed the HVAD parameters from today, and compared the results to the patient's prior recorded data.  No programming changes were made.  The HVAD is functioning within specified parameters.    Graciella Freer, PA-C 05/14/2017, 8:59 AM  VAD Team --- VAD ISSUES ONLY--- Pager [phone] (7am - 7am) Advanced Heart Failure Team  Pager [phone] (M-F; 7a - 4p)  Please contact [facility] for night-coverage after hours (4p -7a ) and weekends on [email or web address]  Patient seen with PA, agree with the above note.    He was doing well until this morning but went back into slow VT in 120s.  We paced him out and increased amiodarone to 400 mg tid.    Repeat CBC this afternoon showed hgb 8.8.  Will check Fe studies in am.   Creatinine stable.    CVP 5-6, no Lasix.   Continue ambulation and pushing nutrition.   Marca Ancona 05/14/2017 1:08 PM

## 2017-05-14 NOTE — Progress Notes (Signed)
Patient ID: James [name], male   DOB: [birth year], 56 y.o.   MRN: [id number] Heart[facility]ate 3 Rounding Note  Subjective:    Feels ok Had recurrent VT today with rate 120's that was asymptomatic. He was paced back to sinus and po amio increased but had recurrent episode of VT and had to be paced again and started on IV amio after another bolus.   LVAD INTERROGATION:  HeartMate IIl LVAD:  Flow 4.7 liters/min, speed 2660, power 4  Objective:    Vital Signs:   Temp:  [97.6 F (36.4 C)-99.2 F (37.3 C)] 98.5 F (36.9 C) (07/25 1500) Pulse Rate:  [54-78] 54 (07/25 1700) Resp:  [19-31] 27 (07/25 1700) BP: (80-98)/(56-85) 96/84 (07/25 1700) SpO2:  [98 %-100 %] 100 % (07/25 1700) Weight:  [108.2 kg (238 lb 8.6 oz)] 108.2 kg (238 lb 8.6 oz) (07/25 0500) Last BM Date: 05/13/17 Mean arterial Pressure 70's to 90  Intake/Output:   Intake/Output Summary (Last 24 hours) at 05/14/17 1804 Last data filed at 05/14/17 1700  Gross per 24 hour  Intake          1087.68 ml  Output              600 ml  Net           487.68 ml     Physical Exam: General:  Well appearing. A little down in the dumps after VT today. No resp difficulty HEENT: normal Cor:  LVAD hum present. Lungs: clear Abdomen: soft, nontender, nondistended. No hepatosplenomegaly. No bruits or masses. Good bowel sounds. Extremities: marked lower ext edema bilat. Neuro: alert & orientedx3, cranial nerves grossly intact. moves all 4 extremities w/o difficulty. Affect pleasant  Telemetry: sinus 60's  Labs: Basic Metabolic Panel:  Recent Labs Lab 05/08/17 0339  05/11/17 0326 05/12/17 0413 05/13/17 0438 05/14/17 0528 05/14/17 1107  NA 135  < > 137 135 135 136 136  K 4.2  < > 3.9 3.7 3.9 3.8 3.7  CL 109  < > 112* 109 111 111 112*  CO2 18*  < > 19* 20* 19* 20* 19*  GLUCOSE 164*  < > 110* 184* 131* 87 105*  BUN 79*  < > 54* 50* 48* 45* 43*  CREATININE 1.95*  < > 1.56* 1.65* 1.62* 1.62* 1.55*  CALCIUM 7.8*  < > 7.6* 7.4* 7.5* 7.6*  7.7*  MG 2.0  --  2.0  --   --   --  1.8  PHOS 4.7*  --   --   --   --   --   --   < > = values in this interval not displayed.  Liver Function Tests:  Recent Labs Lab 05/10/17 0408  AST 19  ALT 12*  ALKPHOS 77  BILITOT 0.7  PROT 4.1*  ALBUMIN 1.6*   No results for input(s): LIPASE, AMYLASE in the last 168 hours.  Recent Labs Lab 05/09/17 1804  AMMONIA 34    CBC:  Recent Labs Lab 05/11/17 0326 05/12/17 0413 05/13/17 0438 05/14/17 0528 05/14/17 1107  WBC 11.6* 17.8* 9.8 6.8 7.2  NEUTROABS  --  15.6*  --   --   --   HGB 8.5* 8.7* 8.1* 8.0* 8.8*  HCT 26.5* 26.9* 25.2* 25.1* 28.2*  MCV 89.5 89.7 89.7 91.3 90.7  PLT 273 309 284 297 346    INR:  Recent Labs Lab 05/10/17 0408 05/11/17 0326 05/12/17 0413 05/13/17 0438 05/14/17 0528  INR 2.33 2.15 2.25 2.11 2.13  LDH 167  Other results:  EKG:   Imaging:  No results found.   Medications:     Scheduled Medications: . amiodarone  150 mg Intravenous Once  . Chlorhexidine Gluconate Cloth  6 each Topical Daily  . febuxostat  120 mg Oral Daily  . feeding supplement  1 Container Oral BID BM  . feeding supplement (ENSURE ENLIVE)  237 mL Oral Q24H  . fentaNYL (SUBLIMAZE) injection  50 mcg Intravenous Once  . hydrALAZINE  37.5 mg Oral Q8H  . insulin aspart  0-15 Units Subcutaneous TID WC  . isosorbide mononitrate  30 mg Oral Daily  . metoCLOPramide  5 mg Oral TID AC  . mexiletine  300 mg Oral Q8H  . pantoprazole  40 mg Oral BID  . sodium chloride flush  10-40 mL Intracatheter Q12H  . traZODone  50 mg Oral QHS  . warfarin  2.5 mg Oral ONCE-1800  . Warfarin - Pharmacist Dosing Inpatient   Does not apply q1800     Infusions: . sodium chloride Stopped (05/02/17 0630)  . sodium chloride 10 mL/hr at 05/14/17 1700  . amiodarone     Followed by  . amiodarone    . amiodarone 60 mg/hr (05/14/17 1700)  . amiodarone    . fentaNYL infusion INTRAVENOUS 100 mcg/hr (05/07/17 1600)     PRN  Medications:  albuterol, docusate, fentaNYL, hydrALAZINE, neomycin-bacitracin-polymyxin, ondansetron (ZOFRAN) IV, oxyCODONE, phenol, sodium chloride flush, traMADol   Assessment/Plan/Discussion:    POD 27s/p HVAD for ischemic cardiomyopathy with acute on chronic systolic heart failure, EF 15% with moderate RV dysfunction. Returned to OR for bleeding from raw mediastinal tissues.  He has been hemodynamically stable off inotropes.  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 Continuing IV protonix and will maintain INR 2-2.5 without ASA.  Acute respiratory failure due to aspiration pneumonia resolved.  Ileus resolved  INR therapeutic at 2.13. Pharmacy is managing   DM: preop Hgb A1c 5.9. Glucose under good control. ContinueLevemir and SSI.  Gout: observe  Hx of arrhythmias preop on amio and postopsustained VTpossibly due to suction events with diuresis and volume shifts. He was stable in sinus on amio and mexiletine but developed recurrent VT today requiring pacing and restarted IV amio. CVP was 9 this am and he is not being diuresed due to VT. He still has a lot of extra volume on board with marked bilateral leg edema. ICD in place but will wait to turn on therapies.   Severe malnutrition with prealbumin <5: Encourage PO intake  Physical therapy. Hopefully can get to CIR if rhythm stabilizes.   I reviewed the LVAD parameters from today, and compared the results to the patient's prior recorded data. LVAD equipment check completed andis in good working order. Back-up equipment present.      Length of Stay: [address]33  Payton DoughtyBryan K Fallbrook Hospital DistrictBartle 05/14/2017, 6:04 PM

## 2017-05-14 NOTE — Progress Notes (Signed)
James [name] went back into VT at a rate 119. NIPS performed pacing at 330 msec for 6 seconds which converted him back to sinus rhythm. Agree with amiodarone IV currently.  James Cherry Elberta Fortisamnitz, MD 05/14/2017 4:31 PM

## 2017-05-14 NOTE — Progress Notes (Addendum)
LVAD Inpatient Coordinator Rounding Note:  Admitted 03/27/2017 due to A/C Heart failure.   [facility] LVAD implanted on 08/18/17 by Dr. Laneta SimmersBartle as DT VAD.  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 He had epinephrine, APC, and 2 clips.   Vital signs: HR: 74 Doppler:  82 Auto BP:  85/70 (77) O2 Sat: 100% on RA Wt:207>209 > 213 >213 > 208>203>220>227lbs...........234>233>240>240>236>239>238  LVAD interrogation reveals:  Speed: 2660 Flow: 4.6 Power: 4.1w Alarms:none Peak: 7.0 Trough: 2.5 HCT: 25 Low flow alarm setting: 2.5 High watt alarm setting: 6.5   Suction: on  Lavare cycle: on  Blood Products: 6/28> 5 PRBC's, 6 FFP 7/1> 1 PRBC 7/9> 1 PRBC 7/13>3 PRBC 7/14>3 PRBC 7/15> 1 PRBC 7/16>3PRBC 7/17> 3 PRBC 7/20> 1 PRBC  Gtts: Milrinone restarted 7/8, stopped 7/16 Levo stopped 04/26/17 restarted 05/13/2017 - stopped 05/07/17 Amiodarone stopped 04/29/17, restarted 7/17 - transitioned to PO 05/13/17 Heparin stopped 04/29/17 Lidocaine 05/07/17 - stopped 7/19  TPN - started 04/24/17 for nutritional support, stopped 04/30/17   Arrhythmia: 04/24/17 - Afib with RVR - started amiodarone, now PO 05/02/17 - 2 sustained episodes of VT - cardioverted x 1; second broke with overdrive pacing 7/17- wide complex tachycardia- Amio bolus x3 and gtt at 60 mg/hr 05/07/17 - sustained VT - converted with overdrive pacing; Lidocaine started in addition to amiodarone 05/08/17 - Lidocaine stopped due to confusion (lido level 12.4)  Respiratory: 04/23/17 - re-intubated due to respiratory failure secondary to suspected aspiration pneumonia 04/25/17-extubated 04/27/2017- intubated for EGD 05/19/2017- extubated  Drive Line: Daily Dressing Kits with Aquacell AG silver strips per protocol.   Labs:  LDH trend:160 (pre VAD)>227>215......200>183>161>164>184>195>190>188>187>205>212>185>167  INR trend:  1.31>1.45>1.44>2.09>2.28>3.58>3.94>3.22>2.17>1.74>1.7>2.24>2.68>2.33>2.15>2.25>2.11>2.13  Anticoagulation Plan: -INR Goal: 2-2.5  -ASA Dose: 325 mg- on hold  Adverse Events on VAD: - [facility] evacuation of mediastinal hematoma - 04/23/17 Ileus with vomiting; re-intubation for acute respiratory failure; probable aspiration pneumonia - 7/13-GI bleed - 7/19 - Lidocaine gtt stopped due to confusion (lido toxicity)    Plan/Recommendations:  1. Continue daily drive line dressing changes with patient's wife.  2. Please call VAD pager with patient and equipment concerns. 3. [facility]   Hessie DienerMolly Reece RN, VAD Coordinator 24/7 pager [phone]

## 2017-05-14 NOTE — Progress Notes (Signed)
ANTICOAGULATION CONSULT NOTE - Follow Up Consult  Pharmacy Consult for Warfarin  Indication: HVAD  No Active Allergies  Patient Measurements: Height: 6' (182.9 cm) Weight: 238 lb 8.6 oz (108.2 kg) IBW/kg (Calculated) : 77.6  Vital Signs: Temp: 98 F (36.7 C) (07/25 0700) Temp Source: Oral (07/25 0700) BP: 98/68 (07/25 0900) Pulse Rate: 69 (07/25 0900)  Labs:  Recent Labs  05/12/17 0413 05/13/17 0438 05/14/17 0528  HGB 8.7* 8.1* 8.0*  HCT 26.9* 25.2* 25.1*  PLT 309 284 297  LABPROT 25.2* 24.0* 24.2*  INR 2.25 2.11 2.13  CREATININE 1.65* 1.62* 1.62*    Estimated Creatinine Clearance: 66.2 mL/min (A) (by C-G formula based on SCr of 1.62 mg/dL (H)).   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 He developed an ileus on 7/5, warfarin was held, and heparin bridge started once INR fell below 1.8.  Heparin stopped 7/10 with INR 2.19. On 7/13, Hgb dropped and he had melena - warfarin held again and pharmacy asked to start heparin once INR < 1.8 pending GI workup.   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   Warfarin resumed 7/18 without heparin bridge. INR  trend up, now back in goal range at 2.13. Hgb 8 low stable - has received multiple PRBCs. Diet advanced.  Remains on Amiodarone drip> po and mexilitine for VT  Goal of Therapy:  INR 2-2.5 Monitor platelets by anticoagulation protocol: Yes   Plan:  1) Warfarin 2.5 mg x 1 tonight. 2) Daily INR  Leota SauersLisa Mikiya Nebergall Pharm.D. CPP, BCPS Clinical Pharmacist [phone] 05/14/2017 11:15 AM

## 2017-05-14 NOTE — Progress Notes (Signed)
PT Cancellation Note  Patient Details Name: James [name] MRN: [id number] DOB: Aug 21, 1961   Cancelled Treatment:    Reason Eval/Treat Not Completed: Medical issues which prohibited therapy;Other (comment) (Pt had episode of Vtach. Bed rest for 4 hours.) Will check back tomorrow. Thanks.   James [name] 05/14/2017, 3:39 PM

## 2017-05-14 NOTE — Progress Notes (Signed)
Pt with recurrent VT at 124bpm - pace terminated at 340msec. Advised ok to give another bolus of IV amiodarone if recurrent VT.   [name], NP 05/14/2017 2:22 PM

## 2017-05-14 NOTE — Progress Notes (Signed)
CSW attempted to visit with patient at bedside although patient was sleeping. CSW will continue to follow for support and needs as identified. Lasandra BeechJackie Shaunice Levitan, LCSW, CCSW-MCS [phone]

## 2017-05-14 NOTE — Progress Notes (Signed)
  Paged for patient going back into VT. 1042 per tele strip. Wide complex, rates in 120s.  Pt asymptomatic. Doppler pressure and HVAD waveform unaffected.   Examined with VAD Coordinator, Kirt BoysMolly. Pt asymptomatic. Does not feel any different.  No bleeding.  Hgb borderline this am. Preload dependent. Recheck BMET, Mg, and CBC now. Supp Mg (start with 2 g).   Will have EP see again.  Will rebolus 150 mg of amiodarone. Transitioned to po from IV yesterday.   Dr. Shirlee LatchMcLean aware.  Casimiro NeedleMichael [address]Andy" Luzer[place]illery, PA-C 05/14/2017 10:58 AM

## 2017-05-14 NOTE — Progress Notes (Signed)
OT Cancellation Note  Patient Details Name: James [name] MRN: [id number] DOB: 1961/04/16   Cancelled Treatment:    Reason Eval/Treat Not Completed: Medical issues which prohibited therapy.  Pt with V-tach.  Will reattempt.  Alianys Chacko Orickonarpe, OTR/L [phone]   [name], Quintus Premo M 05/14/2017, 1:41 PM

## 2017-05-14 NOTE — Progress Notes (Signed)
Pt maintains VT rhythm in 119-122. HF team notified. No new orders at this time. Pt asymptomatic and stable at this time.

## 2017-05-14 NOTE — Progress Notes (Signed)
James OaksHarry D [name] went back into VT at a rate 124. NIPS performed pacing at 320 msec for 1.5 seconds which converted him back to sinus rhythm. Recommend increasing PO amiodarone to 400 mg TID.  Edie Vallandingham Elberta Fortisamnitz, MD 05/14/2017 11:43 AM

## 2017-05-14 NOTE — Progress Notes (Signed)
Pt has returned back to VT with rate 120-122. HF PA notified.Pt stable any asymptomatic at this time. New orders obtained. Will continue to monitor.

## 2017-05-15 LAB — MRSA PCR SCREENING: MRSA BY PCR: NEGATIVE

## 2017-05-15 LAB — BASIC METABOLIC PANEL
Anion gap: 5 (ref 5–15)
BUN: 42 mg/dL — AB (ref 6–20)
CALCIUM: 7.6 mg/dL — AB (ref 8.9–10.3)
CO2: 20 mmol/L — AB (ref 22–32)
CREATININE: 1.59 mg/dL — AB (ref 0.61–1.24)
Chloride: 110 mmol/L (ref 101–111)
GFR calc non Af Amer: 48 mL/min — ABNORMAL LOW (ref >60)
GFR, EST AFRICAN AMERICAN: 55 mL/min — AB (ref >60)
Glucose, Bld: 125 mg/dL — ABNORMAL HIGH (ref 65–99)
Potassium: 3.9 mmol/L (ref 3.5–5.1)
SODIUM: 135 mmol/L (ref 135–145)

## 2017-05-15 LAB — LACTATE DEHYDROGENASE: LDH: 167 U/L (ref 98–192)

## 2017-05-15 LAB — CBC
HCT: 26.1 % — ABNORMAL LOW (ref 39.0–52.0)
Hemoglobin: 8.4 g/dL — ABNORMAL LOW (ref 13.0–17.0)
MCH: 29 pg (ref 26.0–34.0)
MCHC: 32.2 g/dL (ref 30.0–36.0)
MCV: 90 fL (ref 78.0–100.0)
Platelets: 333 10*3/uL (ref 150–400)
RBC: 2.9 MIL/uL — ABNORMAL LOW (ref 4.22–5.81)
RDW: 18.2 % — AB (ref 11.5–15.5)
WBC: 5.8 10*3/uL (ref 4.0–10.5)

## 2017-05-15 LAB — GLUCOSE, CAPILLARY
GLUCOSE-CAPILLARY: 139 mg/dL — AB (ref 65–99)
Glucose-Capillary: 100 mg/dL — ABNORMAL HIGH (ref 65–99)
Glucose-Capillary: 132 mg/dL — ABNORMAL HIGH (ref 65–99)
Glucose-Capillary: 71 mg/dL (ref 65–99)

## 2017-05-15 LAB — IRON AND TIBC
Iron: 10 ug/dL — ABNORMAL LOW (ref 45–182)
Saturation Ratios: 9 % — ABNORMAL LOW (ref 17.9–39.5)
TIBC: 112 ug/dL — ABNORMAL LOW (ref 250–450)
UIBC: 102 ug/dL

## 2017-05-15 LAB — COOXEMETRY PANEL
CARBOXYHEMOGLOBIN: 1.6 % — AB (ref 0.5–1.5)
METHEMOGLOBIN: 0.8 % (ref 0.0–1.5)
O2 Saturation: 72.8 %
TOTAL HEMOGLOBIN: 8.4 g/dL — AB (ref 12.0–16.0)

## 2017-05-15 LAB — BRAIN NATRIURETIC PEPTIDE: B NATRIURETIC PEPTIDE 5: 604.1 pg/mL — AB (ref 0.0–100.0)

## 2017-05-15 LAB — PROTIME-INR
INR: 2.45
Prothrombin Time: 27 seconds — ABNORMAL HIGH (ref 11.4–15.2)

## 2017-05-15 LAB — RETICULOCYTES
RBC.: 2.9 MIL/uL — AB (ref 4.22–5.81)
RETIC COUNT ABSOLUTE: 49.3 10*3/uL (ref 19.0–186.0)
RETIC CT PCT: 1.7 % (ref 0.4–3.1)

## 2017-05-15 LAB — FOLATE: FOLATE: 7.1 ng/mL (ref >5.9)

## 2017-05-15 LAB — VITAMIN B12: Vitamin B-12: 587 pg/mL (ref 180–914)

## 2017-05-15 LAB — FERRITIN: Ferritin: 780 ng/mL — ABNORMAL HIGH (ref 24–336)

## 2017-05-15 MED ORDER — FERUMOXYTOL INJECTION 510 MG/17 ML
510.0000 mg | Freq: Once | INTRAVENOUS | Status: AC
  Administered 2017-05-15: 510 mg via INTRAVENOUS
  Filled 2017-05-15: qty 17

## 2017-05-15 MED ORDER — HYDRALAZINE HCL 25 MG PO TABS
12.5000 mg | ORAL_TABLET | Freq: Once | ORAL | Status: AC
  Administered 2017-05-15: 12.5 mg via ORAL
  Filled 2017-05-15: qty 1

## 2017-05-15 MED ORDER — WARFARIN SODIUM 2 MG PO TABS
2.0000 mg | ORAL_TABLET | Freq: Once | ORAL | Status: AC
  Administered 2017-05-15: 2 mg via ORAL
  Filled 2017-05-15: qty 1

## 2017-05-15 MED ORDER — HYDRALAZINE HCL 50 MG PO TABS
50.0000 mg | ORAL_TABLET | Freq: Three times a day (TID) | ORAL | Status: DC
  Administered 2017-05-15 – 2017-05-20 (×15): 50 mg via ORAL
  Filled 2017-05-15 (×15): qty 1

## 2017-05-15 NOTE — Progress Notes (Signed)
LVAD Inpatient Coordinator Rounding Note:  Admitted 03/29/2017 due to A/C Heart failure.   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 Laneta SimmersBartle as DT VAD.  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 He had epinephrine, APC, and 2 clips.   Vital signs: HR: 56  Doppler:  94 Auto BP:  102/85 (93) O2 Sat: 100% on RA Wt:207>209 > 213 >213 > 208>203>220>227lbs...........161>096>045>409>811>914>782>956234>233>240>240>236>239>238>240  LVAD interrogation reveals:  Speed: 2660 Flow: 4.4 Power: 4.0w Alarms:none Peak: 7.0 Trough: 4 HCT: 25 Low flow alarm setting: 2.5 High watt alarm setting: 6.5   Suction: on  Lavare cycle: on  Blood Products: 6/28> 5 PRBC's, 6 FFP 7/1> 1 PRBC 7/9> 1 PRBC 7/13>3 PRBC 7/14>3 PRBC 7/15> 1 PRBC 7/16>3PRBC 7/17> 3 PRBC 7/20> 1 PRBC  Gtts: Milrinone restarted 7/8, stopped 7/16 Levo stopped 04/26/17 restarted 05/09/2017 - stopped 05/07/17 Amiodarone stopped 04/29/17, restarted 7/17 - transitioned to PO 05/13/17; VT on 05/15/17 - IV amio re-started - 30 mg/hr Heparin stopped 04/29/17 Lidocaine 05/07/17 - stopped 7/19  TPN - started 04/24/17 for nutritional support, stopped 04/30/17  Arrhythmia: 04/24/17 - Afib with RVR - started amiodarone 05/02/17 - 2 sustained episodes of VT - cardioverted x 1; second broke with overdrive pacing 7/17- wide complex tachycardia- Amio bolus x3 and gtt at 60 mg/hr 05/07/17 - sustained VT - converted with overdrive pacing; Lidocaine started in addition to amiodarone 05/08/17 - Lidocaine stopped due to confusion (lido level 12.4) 05/14/17 - sustained VT - converted 3 separate occassions with overdrive pacing  Respiratory: 04/23/17 - re-intubated due to respiratory failure secondary to suspected aspiration pneumonia 04/25/17-extubated 05/01/2017- intubated for EGD 04/25/2017- extubated  Drive Line: Daily Dressing Kits with Aquacell AG silver strips per protocol.   Labs:  LDH trend:160 (pre  VAD)>227>215......[phone]>183>161>164>184>195>190>188>187>205>212>185>167>167  INR trend: 1.31>1.45>1.44>2.09>2.28>3.58>3.94>3.22>2.17>1.74>1.7>2.24>2.68>2.33>2.15>2.25>2.11>2.13>2.45  Anticoagulation Plan: -INR Goal: 2-2.5  -ASA Dose: 325 mg- on hold  Adverse Events on VAD: [facility] - 04/23/17 Ileus with vomiting; re-intubation for acute respiratory failure; probable aspiration pneumonia - 7/13-GI bleed - 7/19 - Lidocaine gtt stopped due to confusion (lido toxicity)    Plan/Recommendations:  1. Continue daily drive line dressing changes with patient's wife.  2. Please call VAD pager with patient and equipment concerns. 3. Asked pt to complete reading of Discharge Binder including HVAD patient handbook.  4. [facility].  Hessie DienerMolly Amos Micheals RN, VAD Coordinator 24/7 pager [phone]

## 2017-05-15 NOTE — Progress Notes (Addendum)
OT Cancellation Note  Patient Details Name: James FabianHarry D Borello MRN: [id number] DOB: 11-17-1960   Cancelled Treatment:    Reason Eval/Treat Not Completed: Fatigue/lethargy limiting ability to participate.  Pt reports he just got back into bed and is exhausted as he didn't sleep last night.  He looks tired today.  Pt has never refused to work with OT since he has been here.  Will defer today.  Spoke with RN, who will get him up later.   Chandlor Noecker [place], OTR/L [phone]   [name], Jodye Scali M 05/15/2017, 11:22 AM

## 2017-05-15 NOTE — Progress Notes (Signed)
PT Cancellation Note  Patient Details Name: Edd FabianHarry D Allman MRN: [id number] DOB: 1961-02-26   Cancelled Treatment:    Reason Eval/Treat Not Completed:  (Pt too fatigued.  Will check back tomorrow. )   Berline LopesDawn F Alexius Hangartner 05/15/2017, 12:10 PM Amenah Tucci,PT Acute Rehabilitation [phone] (pager)

## 2017-05-15 NOTE — Care Management Important Message (Signed)
Important Message  Patient Details  Name: James FabianHarry D [name] MRN: [id number] Date of Birth: Jun 23, 1961   Medicare Important Message Given:  Yes    [name], [name] [place], RN 05/15/2017, 3:34 PM

## 2017-05-15 NOTE — Progress Notes (Signed)
ANTICOAGULATION CONSULT NOTE - Follow Up Consult  Pharmacy Consult for Warfarin  Indication: HVAD  No Active Allergies  Patient Measurements: Height: 6' (182.9 cm) Weight: 240 lb (108.9 kg) IBW/kg (Calculated) : 77.6  Vital Signs: Temp: 98.3 F (36.8 C) (07/26 0805) Temp Source: Oral (07/26 0805) BP: 101/89 (07/26 0600) Pulse Rate: 71 (07/26 0700)  Labs:  Recent Labs  05/13/17 0438 05/14/17 0528 05/14/17 1107 05/15/17 0500  HGB 8.1* 8.0* 8.8* 8.4*  HCT 25.2* 25.1* 28.2* 26.1*  PLT 284 297 346 333  LABPROT 24.0* 24.2*  --  27.0*  INR 2.11 2.13  --  2.45  CREATININE 1.62* 1.62* 1.55* 1.59*    Estimated Creatinine Clearance: 67.7 mL/min (A) (by C-G formula based on SCr of 1.59 mg/dL (H)).   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 He developed an ileus on 7/5, warfarin was held, and heparin bridge started once INR fell below 1.8.  Heparin stopped 7/10 with INR 2.19. On 7/13, Hgb dropped and he had melena - warfarin held again and pharmacy asked to start heparin once INR < 1.8 pending GI workup.   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   Warfarin resumed 7/18 without heparin bridge. INR  trend up, now back in goal range at 2.4. Hgb 8 low stable - has received multiple PRBCs. Diet advanced.  Remains on Amiodarone drip> po> back on drip post iv boluses 7/25 for VT and increased mexilitine dose.  Goal of Therapy:  INR 2-2.5 Monitor platelets by anticoagulation protocol: Yes   Plan:  1) Warfarin 2 mg x 1 tonight. 2) Daily INR  Leota SauersLisa Giselle Brutus Pharm.D. CPP, BCPS Clinical Pharmacist [phone] 05/15/2017 8:35 AM

## 2017-05-15 NOTE — Progress Notes (Signed)
CSW met at bedside with patient. Patient shared frustrations of lengthy recovery and mostly inability to sleep. Patient states "I am just exhausted". Patient eating a sandwich and states he is trying to eat and stay positive. Patient appears motivated but frustrated with exhaustion. CSW will continue to follow for supportive needs. Raquel Sarna, [place], [place]

## 2017-05-15 NOTE — Progress Notes (Signed)
Chaplain stopped in to visit with patient for a follow-up visit.  Patient is happy that he has sat up for a few hours but would love to be able to sleep.  Patient is fighting being and feeling exhausted stating "exhausted isn't even the word for what I am feeling".  Chaplain and patient talked about how to get through one moment/day at a time.  Talked about listening to music that could be helpful in him relaxing and possibly sleeping.  Chaplain is available for this patient as often as needed so please page.   Patient is thought well of from medical staff.  Chaplain would like to thank the medical team for their care for this patient.  Ephesians 6:8    05/15/17 1121  Clinical Encounter Type  Visited With Patient  Visit Type Follow-up;Spiritual support;Social support  Referral From [place];Family  Consult/Referral To [facility] PontChaplain

## 2017-05-15 NOTE — Progress Notes (Signed)
Inpatient Rehabilitation  Continuing to follow along for timing of anticipated medical readiness, insurance authorization, and IP Rehab bed availability.  Please call with questions.    Charlane FerrettiMelissa Dianne Bady, M.A., CCC/SLP Admission Coordinator  [facility]  Cell [phone]

## 2017-05-15 NOTE — Progress Notes (Signed)
Patient ID: James [name], male   DOB: 08/29/1961, 56 y.o.   MRN: [id number] SICU Evening Rounds:  He has been hemodynamically stable today with no further VT on IV amiodarone and Mexiletine.  LVAD parameters stable.  Co-ox 73 this am  He did not walk today because he said he was tired. Says he did not sleep much last night. He reportedly slept a lot today.  I encouraged him to continue taking walks even if he is tired.

## 2017-05-15 NOTE — Progress Notes (Addendum)
Patient ID: James [name], male   DOB: [birth year], 56 y.o.   MRN: [id number]   Advanced Heart Failure VAD Team Note  Subjective:    HVAD placed 6/28.  Returned to the OR that evening with high chest tube output, evacuation of mediastinal hematoma.   Extubated 6/29. Milrinone stopped on 7/4.  Evening of 7/4, patient developed ileus with respiratory compromise. NGT placed with 3 L suctioned out.  CXR with suspicion for aspiration PNA.  Patient had to be intubated.  He went into atrial fibrillation with RVR.  He became hypotensive and was started on norepinephrine and phenylephrine.  Amiodarone gtt begun.     Extubated again on 7/6.   On 7/13 Had 2 sustained episodes of VT. Had to be cardioverted 1. Second episode broke with overdrive pacing with Dr. Graciela Husbands. VAD speed turned down to 2700.   GI bleed --> 2 units PRBCs 7/14, 2 units 7/15. 3 U PRBCs 7/16,  05/19/2017 3 UPRBCs.   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  He had epinephrine, APC, and 2 clips. Intubated prior to procedure and placed on norepinephrine. Speed dropped to 2660.   7/18 Back in VT with overdrive pacing. Lidocaine was started in addition to amiodarone. Extubated.  7/19 lidocaine stopped with confusion. Neuro consulted.  EEG normal.  CT of head no acute findings. 7/20 confusion had resolved.   1 unit PRBCs 7/20.   On 7/22, developed sustained VT with rate around 130 shortly after getting IV Lasix.  We were able to pace him out and back to NSR.  He was put back on amiodarone gtt.   Episodes VT on 7/25, paced out.  Back on IV amiodarone gtt.  NSR.   CVP 7-8. Feeling OK this am. No complaints. Denies SOB or CP. No lightheadedness or dizziness.  Coox 72.8%.  INR 2.48. Hgb 8.4.   HVAD INTERROGATION:  HVAD:  Flow 4.0 liters/min, speed 2660,  power 4.0 W,  Peak 7.3 Trough 2.5. Suction On. Lavare On.  No alarms.   Objective:    Vital Signs:   Temp:  [97.6 F (36.4 C)-98.5  F (36.9 C)] 98.3 F (36.8 C) (07/26 0805) Pulse Rate:  [54-80] 71 (07/26 0700) Resp:  [18-32] 22 (07/26 0700) BP: (80-107)/(56-89) 101/89 (07/26 0600) SpO2:  [93 %-100 %] 100 % (07/26 0700) Weight:  [240 lb (108.9 kg)] 240 lb (108.9 kg) (07/26 0620) Last BM Date: 05/14/17 Mean arterial Pressure 90-95s  Intake/Output:   Intake/Output Summary (Last 24 hours) at 05/15/17 0805 Last data filed at 05/15/17 0700  Gross per 24 hour  Intake           1880.1 ml  Output              350 ml  Net           1530.1 ml     Physical Exam  CVP 7-8  Physical Exam: GENERAL: Well appearing this am. NAD.  HEENT: Normal. NECK: Supple, JVP 7-8 cm. Carotids OK.  CARDIAC:  Mechanical heart sounds with LVAD hum present.  LUNGS:  CTAB, normal effort.  ABDOMEN:  NT, ND, no HSM. No bruits or masses. +BS  LVAD exit site: Well-healed and incorporated. Dressing dry and intact. No erythema or drainage. Stabilization device present and accurately applied. Driveline dressing changed daily per sterile technique. EXTREMITIES:  Warm and dry. No cyanosis, clubbing, rash, or edema. RUE PICC. Chronic extensive gouty changes bilateral hands. 1-2+ edema to  knees bilaterally.  NEUROLOGIC:  Alert & oriented x 3. Cranial nerves grossly intact. Moves all 4 extremities w/o difficulty. Affect pleasant     Telemetry   Personally reviewed, Sinus Brady vs slow Afib. No further VT since 1600 => ECG confirms NSR.   Labs   Basic Metabolic Panel:  Recent Labs Lab 05/11/17 0326 05/12/17 0413 05/13/17 0438 05/14/17 0528 05/14/17 1107 05/15/17 0500  NA 137 135 135 136 136 135  K 3.9 3.7 3.9 3.8 3.7 3.9  CL 112* 109 111 111 112* 110  CO2 19* 20* 19* 20* 19* 20*  GLUCOSE 110* 184* 131* 87 105* 125*  BUN 54* 50* 48* 45* 43* 42*  CREATININE 1.56* 1.65* 1.62* 1.62* 1.55* 1.59*  CALCIUM 7.6* 7.4* 7.5* 7.6* 7.7* 7.6*  MG 2.0  --   --   --  1.8  --     Liver Function Tests:  Recent Labs Lab 05/10/17 0408  AST 19    ALT 12*  ALKPHOS 77  BILITOT 0.7  PROT 4.1*  ALBUMIN 1.6*   No results for input(s): LIPASE, AMYLASE in the last 168 hours.  Recent Labs Lab 05/09/17 1804  AMMONIA 34    CBC:  Recent Labs Lab 05/12/17 0413 05/13/17 0438 05/14/17 0528 05/14/17 1107 05/15/17 0500  WBC 17.8* 9.8 6.8 7.2 5.8  NEUTROABS 15.6*  --   --   --   --   HGB 8.7* 8.1* 8.0* 8.8* 8.4*  HCT 26.9* 25.2* 25.1* 28.2* 26.1*  MCV 89.7 89.7 91.3 90.7 90.0  PLT 309 284 297 346 333    INR:  Recent Labs Lab 05/11/17 0326 05/12/17 0413 05/13/17 0438 05/14/17 0528 05/15/17 0500  INR 2.15 2.25 2.11 2.13 2.45    Other results:     Imaging   No results found.   Medications:     Scheduled Medications: . amiodarone  150 mg Intravenous Once  . Chlorhexidine Gluconate Cloth  6 each Topical Daily  . febuxostat  120 mg Oral Daily  . feeding supplement  1 Container Oral BID BM  . feeding supplement (ENSURE ENLIVE)  237 mL Oral Q24H  . fentaNYL (SUBLIMAZE) injection  50 mcg Intravenous Once  . hydrALAZINE  37.5 mg Oral Q8H  . insulin aspart  0-15 Units Subcutaneous TID WC  . isosorbide mononitrate  30 mg Oral Daily  . metoCLOPramide  5 mg Oral TID AC  . mexiletine  300 mg Oral Q8H  . pantoprazole  40 mg Oral BID  . sodium chloride flush  10-40 mL Intracatheter Q12H  . traZODone  50 mg Oral QHS  . Warfarin - Pharmacist Dosing Inpatient   Does not apply q1800    Infusions: . sodium chloride Stopped (05/02/17 0630)  . sodium chloride 10 mL/hr at 05/15/17 0700  . amiodarone    . amiodarone 30 mg/hr (05/15/17 0700)  . fentaNYL infusion INTRAVENOUS 100 mcg/hr (05/07/17 1600)    PRN Medications: albuterol, docusate, fentaNYL, hydrALAZINE, neomycin-bacitracin-polymyxin, ondansetron (ZOFRAN) IV, oxyCODONE, phenol, sodium chloride flush, traMADol   Patient Profile   56 yo with CAD s/p CABG, ischemic cardiomyopathy/chronic systolic CHF, tophaceous gout, and CKD stage 3 was admitted for  diuresis and consideration for LVAD placement. S/p HVAD on 6/28  Assessment/Plan:    1. Acute/chronic systolic CHF s/p HVAD: Ischemic cardiomyopathy.  St Jude ICD.  Echo (6/18) with EF 15%, mildly dilated RV with moderately decreased systolic function.  s/p HVAD placement 6/28 and had to return to OR to evacuate mediastinal hematoma.  He had been weaned off pressors/milrinone, but developed ileus w/ likely aspiration event 7/4, re-intubated, developed afib/RVR requiring norepinephrine. Extubated 7/6.  VAD speed turned down to 2660 with VT. VAD parameters currently look good.  Milrinone stopped 7/17 early am with recurrent VT.  Now off norepinephrine. No diuretic again yesterday.  - CVP 7-8 this am. Pt requiring higher CVP to be comfortable.  - Consider po lasix tomorrow. Stable today.  - Off ASA. Back on warfarin, INR 2.45 => goal now is 2-2.5.  No further bleeding. - No dig/spironolactone/arb with elevated creatinine.    - MAP in 90s. Increase hydralazine to 50 mg TID.  2. AKI on CKD stage 3: Suspect a component of peri-op ATN, worsened with ileus, vomiting, intubation/sedation.   - Stable at 1.59 this am.   3. Symptomatic anemia due to acute UGI bleeding:  Received 3 units PRBCs 7/16 and 3 units PRBCs on 04/26/2017. 1 unit PRBCs 7/20. 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  Requiring 2 clips + epi + APC. Does not appear to be bleeding actively now, BM yesterday did not suggest bleeding.   - Continue IV Protonix - Back on warfarin, aiming for INR 2-2.5 range. Will leave off aspirin for now.  - Transfuse hgb < 8. 8.4 this am. No further bleeding. Continue to follow.  4. Ventricular tachycardia: Status post ventricular tachycardia 2 on 7/13. Possible suction event. VAD speed turned down to 2700.  Recurrent VT 7/17. EP called to bedside. Overdrive pacing successful for about 10 minutes but VT recurred.  Milrinone turned off, remained in VT overnight but  hemodynamically stable.  Speed decreased to 2660.  On 7/18, we were able to pace him out of VT.  Lidocaine added then stopped due to confusion/mental status changes.  He had VT 7/22 about 30 minutes after getting IV Lasix, had to be paced out again => may have been due to rapid fluid shift.  Pt paced out of VT 3 separate occasions 7/25. Got amio bolus x 2 and started back on IV infusion.  - No Lasix today with CVP 7-8. Consider resuming po lasix this evening vs tomorrow am.  - Off lidocaine with confusion. Now on mexiletine up to 300 mg every 8 hrs.   5. CAD: s/p CABG 2012. No S/S ischemia.  Off aspirin due to GI bleeding.No change.  6. Gout: Severe tophaceous gout. No complaint of gouty pain today.   - Continue uloric.  7. Atrial fibrillation: Developed atrial fibrillation with RVR in setting of aspiration PNA on 7/4. - Currently NSR.   - On IV amio currently. Will continue for now with VT x 3 yesterday.  8. Malnutrition: Now off TPN. Per Speech- FEEs performed. Continue full liquids. ST to continue to follow.  Encourage po intake, doing better. No change.  9. Aspiration PNA with acute hypoxemic respiratory failure on 7/4: He was extubated on 7/6. Tracheal aspirate with Klebsiella and citrobacter. Both sensitive to Zosyn.  - Completed abx 7/13. No change.  10. Ileus: Resolved. Getting Reglan.  11. Deconditioning:  Continue PT. Plan for CIR when stable. Need to make sure he is not having any more VT on po amio prior to sending.  12. Acute Respiratory Failure: Intubated 7/17 in setting of complex GI intervention. Extubated 7/18. On 4 liters sats stable. No change. 13. Delirium: Resolved. No further. Appreciate neuro input Possibly related to lidocaine.  Lidocaine stopped. Confusion resolved. CT of head negative. EEG ok. No further  workup per neuro.  14. ID: WBCs WNL today.   15. Insomnia: Add trazodone prn. No change. Sleep improved somewhat.   Mobilize and encouraged nutrition. Getting close to  CIR. Will discuss timing with MD.   I reviewed the HVAD parameters from today, and compared the results to the patient's prior recorded data.  No programming changes were made.  The HVAD is functioning within specified parameters.  Graciella FreerMichael Andrew Tillery, PA-C 05/15/2017, 8:05 AM  VAD Team --- VAD ISSUES ONLY--- Pager [phone] (7am - 7am) Advanced Heart Failure Team  Pager [phone] (M-F; 7a - 4p)  Please contact [facility] for night-coverage after hours (4p -7a ) and weekends on [email or web address]  Patient seen with PA, agree with the above note.  Had more VT yesterday, paced out successfully, now back on IV amiodarone. In NSR today.  - Suspect will need more amiodarone loading, continue mexiletine 300 mg every 8 hrs.  Continue amiodarone IV today, try back to po tomorrow.    Other than VT, he is stable clinically.  Hemoglobin and creatinine are stable.  Fe stores low, will give feraheme today.   CVP not elevated.  Will not give diuretic today.  Possibly will start po Lasix tomorrow.   Continue to encourage nutrition => wants Ensure rather than Breeze.   Continue mobilization/PT.   When VT stabilizes, need to get to CIR.   Marca AnconaDalton Shourya Macpherson 05/15/2017 8:57 AM

## 2017-05-16 DIAGNOSIS — I5023 Acute on chronic systolic (congestive) heart failure: Secondary | ICD-10-CM

## 2017-05-16 DIAGNOSIS — Z95811 Presence of heart assist device: Secondary | ICD-10-CM

## 2017-05-16 LAB — PROTIME-INR
INR: 2.59
Prothrombin Time: 28.3 seconds — ABNORMAL HIGH (ref 11.4–15.2)

## 2017-05-16 LAB — BASIC METABOLIC PANEL
Anion gap: 6 (ref 5–15)
BUN: 40 mg/dL — ABNORMAL HIGH (ref 6–20)
CALCIUM: 7.6 mg/dL — AB (ref 8.9–10.3)
CO2: 20 mmol/L — AB (ref 22–32)
CREATININE: 1.6 mg/dL — AB (ref 0.61–1.24)
Chloride: 108 mmol/L (ref 101–111)
GFR calc non Af Amer: 47 mL/min — ABNORMAL LOW (ref >60)
GFR, EST AFRICAN AMERICAN: 55 mL/min — AB (ref >60)
GLUCOSE: 152 mg/dL — AB (ref 65–99)
Potassium: 3.9 mmol/L (ref 3.5–5.1)
Sodium: 134 mmol/L — ABNORMAL LOW (ref 135–145)

## 2017-05-16 LAB — GLUCOSE, CAPILLARY
GLUCOSE-CAPILLARY: 106 mg/dL — AB (ref 65–99)
GLUCOSE-CAPILLARY: 163 mg/dL — AB (ref 65–99)
GLUCOSE-CAPILLARY: 90 mg/dL (ref 65–99)
Glucose-Capillary: 90 mg/dL (ref 65–99)

## 2017-05-16 LAB — COOXEMETRY PANEL
Carboxyhemoglobin: 1.2 % (ref 0.5–1.5)
METHEMOGLOBIN: 1.4 % (ref 0.0–1.5)
O2 SAT: 77.1 %
TOTAL HEMOGLOBIN: 8.3 g/dL — AB (ref 12.0–16.0)

## 2017-05-16 LAB — LACTATE DEHYDROGENASE: LDH: 164 U/L (ref 98–192)

## 2017-05-16 LAB — CBC
HEMATOCRIT: 25.8 % — AB (ref 39.0–52.0)
Hemoglobin: 8.2 g/dL — ABNORMAL LOW (ref 13.0–17.0)
MCH: 28.5 pg (ref 26.0–34.0)
MCHC: 31.8 g/dL (ref 30.0–36.0)
MCV: 89.6 fL (ref 78.0–100.0)
PLATELETS: 323 10*3/uL (ref 150–400)
RBC: 2.88 MIL/uL — ABNORMAL LOW (ref 4.22–5.81)
RDW: 18.1 % — AB (ref 11.5–15.5)
WBC: 4.9 10*3/uL (ref 4.0–10.5)

## 2017-05-16 MED ORDER — AMIODARONE HCL 200 MG PO TABS
400.0000 mg | ORAL_TABLET | Freq: Three times a day (TID) | ORAL | Status: DC
  Administered 2017-05-16 – 2017-05-22 (×19): 400 mg via ORAL
  Filled 2017-05-16 (×20): qty 2

## 2017-05-16 MED ORDER — WARFARIN SODIUM 1 MG PO TABS
1.0000 mg | ORAL_TABLET | Freq: Once | ORAL | Status: AC
  Administered 2017-05-16: 1 mg via ORAL
  Filled 2017-05-16: qty 1

## 2017-05-16 NOTE — Progress Notes (Signed)
Patient ID: Edd FabianHarry D Ratliff, male   DOB: 07-16-1961, 56 y.o.   MRN: [id number]   Advanced Heart Failure VAD Team Note  Subjective:    HVAD placed 6/28.  Returned to the OR that evening with high chest tube output, evacuation of mediastinal hematoma.   Extubated 6/29. Milrinone stopped on 7/4.  Evening of 7/4, patient developed ileus with respiratory compromise. NGT placed with 3 L suctioned out.  CXR with suspicion for aspiration PNA.  Patient had to be intubated.  He went into atrial fibrillation with RVR.  He became hypotensive and was started on norepinephrine and phenylephrine.  Amiodarone gtt begun.     Extubated again on 7/6.   On 7/13 Had 2 sustained episodes of VT. Had to be cardioverted 1. Second episode broke with overdrive pacing with Dr. Graciela HusbandsKlein. VAD speed turned down to 2700.   GI bleed --> 2 units PRBCs 7/14, 2 units 7/15. 3 U PRBCs 7/16,  05/09/2017 3 UPRBCs.   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  He had epinephrine, APC, and 2 clips. Intubated prior to procedure and placed on norepinephrine. Speed dropped to 2660.   7/18 Back in VT with overdrive pacing. Lidocaine was started in addition to amiodarone. Extubated.  7/19 lidocaine stopped with confusion. Neuro consulted.  EEG normal.  CT of head no acute findings. 7/20 confusion had resolved.   1 unit PRBCs 7/20.   On 7/22, developed sustained VT with rate around 130 shortly after getting IV Lasix.  We were able to pace him out and back to NSR.  He was put back on amiodarone gtt.   Episodes VT on 7/25, paced out.  Back on IV amiodarone gtt.  NSR.   CVP ~7-8 Feeling OK but tired. Walked the halls once last night. SOB at the end, but no VT of lightheadedness. Denies SOB or CP in room.   Feeling OK this am. No complaints. Denies SOB or CP. No lightheadedness or dizziness.  Coox 72.8%.  INR 2.59. Hgb 8.2    HVAD INTERROGATION:  HVAD:  Flow 4.5 liters/min, speed 2660,  power  4.0 W,  Peak 7.3 Trough 2.1 Suction On. Lavare On. No alarms.    Objective:    Vital Signs:   Temp:  [97.7 F (36.5 C)-98.3 F (36.8 C)] 98.1 F (36.7 C) (07/26 2000) Pulse Rate:  [27-69] 61 (07/27 0700) Resp:  [19-31] 28 (07/27 0700) BP: (90-119)/(76-93) 105/84 (07/27 0600) SpO2:  [83 %-100 %] 98 % (07/27 0700) Weight:  [246 lb 4.1 oz (111.7 kg)] 246 lb 4.1 oz (111.7 kg) (07/27 0615) Last BM Date: 05/15/17 Mean arterial Pressure 90-95s  Intake/Output:   Intake/Output Summary (Last 24 hours) at 05/16/17 0746 Last data filed at 05/16/17 0700  Gross per 24 hour  Intake           1120.8 ml  Output              500 ml  Net            620.8 ml     Physical Exam  CVP ~8   Physical Exam: GENERAL: Fatigued appearing. NAD.  HEENT: Normal. NECK: Supple, JVP 7-8 cm. Carotids OK.  CARDIAC:  Mechanical heart sounds with LVAD hum present.  LUNGS:  CTAB, normal effort.  ABDOMEN:  NT, ND, no HSM. No bruits or masses. +BS  LVAD exit site: Well-healed and incorporated. Dressing dry and intact. No erythema or drainage. Stabilization device present  and accurately applied. Driveline dressing changed daily per sterile technique. EXTREMITIES:  Warm and dry. No cyanosis, clubbing, or rash. 1-2+ edema to knees.  Chronic extensive gouty changes to bilateral hands. RUE PICC NEUROLOGIC:  Alert & oriented x 3. Cranial nerves grossly intact. Moves all 4 extremities w/o difficulty. Affect pleasant     Telemetry   Personally reviewed, remains in NSR with competing junctional rhythm. No further VT.   Labs   Basic Metabolic Panel:  Recent Labs Lab 05/11/17 0326  05/13/17 0438 05/14/17 0528 05/14/17 1107 05/15/17 0500 05/16/17 0330  NA 137  < > 135 136 136 135 134*  K 3.9  < > 3.9 3.8 3.7 3.9 3.9  CL 112*  < > 111 111 112* 110 108  CO2 19*  < > 19* 20* 19* 20* 20*  GLUCOSE 110*  < > 131* 87 105* 125* 152*  BUN 54*  < > 48* 45* 43* 42* 40*  CREATININE 1.56*  < > 1.62* 1.62* 1.55* 1.59*  1.60*  CALCIUM 7.6*  < > 7.5* 7.6* 7.7* 7.6* 7.6*  MG 2.0  --   --   --  1.8  --   --   < > = values in this interval not displayed.  Liver Function Tests:  Recent Labs Lab 05/10/17 0408  AST 19  ALT 12*  ALKPHOS 77  BILITOT 0.7  PROT 4.1*  ALBUMIN 1.6*   No results for input(s): LIPASE, AMYLASE in the last 168 hours.  Recent Labs Lab 05/09/17 1804  AMMONIA 34    CBC:  Recent Labs Lab 05/12/17 0413 05/13/17 0438 05/14/17 0528 05/14/17 1107 05/15/17 0500 05/16/17 0330  WBC 17.8* 9.8 6.8 7.2 5.8 4.9  NEUTROABS 15.6*  --   --   --   --   --   HGB 8.7* 8.1* 8.0* 8.8* 8.4* 8.2*  HCT 26.9* 25.2* 25.1* 28.2* 26.1* 25.8*  MCV 89.7 89.7 91.3 90.7 90.0 89.6  PLT 309 284 297 346 333 323    INR:  Recent Labs Lab 05/12/17 0413 05/13/17 0438 05/14/17 0528 05/15/17 0500 05/16/17 0330  INR 2.25 2.11 2.13 2.45 2.59    Other results:     Imaging   No results found.   Medications:     Scheduled Medications: . amiodarone  150 mg Intravenous Once  . Chlorhexidine Gluconate Cloth  6 each Topical Daily  . febuxostat  120 mg Oral Daily  . feeding supplement  1 Container Oral BID BM  . feeding supplement (ENSURE ENLIVE)  237 mL Oral Q24H  . fentaNYL (SUBLIMAZE) injection  50 mcg Intravenous Once  . hydrALAZINE  50 mg Oral Q8H  . insulin aspart  0-15 Units Subcutaneous TID WC  . isosorbide mononitrate  30 mg Oral Daily  . metoCLOPramide  5 mg Oral TID AC  . mexiletine  300 mg Oral Q8H  . pantoprazole  40 mg Oral BID  . sodium chloride flush  10-40 mL Intracatheter Q12H  . traZODone  50 mg Oral QHS  . Warfarin - Pharmacist Dosing Inpatient   Does not apply q1800    Infusions: . sodium chloride Stopped (05/02/17 0630)  . sodium chloride 10 mL/hr at 05/16/17 0700  . amiodarone    . amiodarone 30 mg/hr (05/16/17 0700)  . fentaNYL infusion INTRAVENOUS 100 mcg/hr (05/07/17 1600)    PRN Medications: albuterol, docusate, fentaNYL, hydrALAZINE,  neomycin-bacitracin-polymyxin, ondansetron (ZOFRAN) IV, oxyCODONE, phenol, sodium chloride flush, traMADol   Patient Profile   56 yo with  CAD s/p CABG, ischemic cardiomyopathy/chronic systolic CHF, tophaceous gout, and CKD stage 3 was admitted for diuresis and consideration for LVAD placement. S/p HVAD on 6/28  Assessment/Plan:    1. Acute/chronic systolic CHF s/p HVAD: Ischemic cardiomyopathy.  St Jude ICD.  Echo (6/18) with EF 15%, mildly dilated RV with moderately decreased systolic function.  s/p HVAD placement 6/28 and had to return to OR to evacuate mediastinal hematoma.  He had been weaned off pressors/milrinone, but developed ileus w/ likely aspiration event 7/4, re-intubated, developed afib/RVR requiring norepinephrine. Extubated 7/6.  VAD speed turned down to 2660 with VT. VAD parameters currently look good.  Milrinone stopped 7/17 early am with recurrent VT.  Now off norepinephrine. No diuretic again yesterday.  - CVP stable ~ 8. Holding on po lasix for now as VT has been closely linked to relative volume depletion.   - Off ASA. Back on warfarin, INR 2.5 -> goal now is 2-2.5.  No further bleeding.  - No dig/spironolactone/arb with elevated creatinine.    - MAPs remains in 90s.  Increase hydralazine to 50 mg TID.  2. AKI on CKD stage 3: Suspect a component of peri-op ATN, worsened with ileus, vomiting, intubation/sedation.   - Stable at 1.6 this am.   3. Symptomatic anemia due to acute UGI bleeding:  Received 3 units PRBCs 7/16 and 3 units PRBCs on 05/08/2017. 1 unit PRBCs 7/20. 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  Requiring 2 clips + epi + APC. Does not appear to be bleeding actively now, BM yesterday did not suggest bleeding.   - Continue IV Protonix - Back on warfarin, aiming for INR 2-2.5 range. Will leave off aspirin for now. Dosing per pharm. - Transfuse hgb < 8. 8.2 this am, fairly stable. No further bleeding. Continue to follow.  4.  Ventricular tachycardia: Status post ventricular tachycardia 2 on 7/13. Possible suction event. VAD speed turned down to 2700.  Recurrent VT 7/17. EP called to bedside. Overdrive pacing successful for about 10 minutes but VT recurred.  Milrinone turned off, remained in VT overnight but hemodynamically stable.  Speed decreased to 2660.  On 7/18, we were able to pace him out of VT.  Lidocaine added then stopped due to confusion/mental status changes.  He had VT 7/22 about 30 minutes after getting IV Lasix, had to be paced out again => may have been due to rapid fluid shift.  Pt paced out of VT 3 separate occasions 7/25. Got amio bolus x 2 and started back on IV infusion.  - CVP stable ~ 7-8 without lasix. Following for need for po lasix => try to avoid IV given VT with more rapid fluid shifts.  - Off lidocaine with confusion. Now on mexiletine up to 300 mg every 8 hrs.   - No further VT. Remains on amiodarone drip. Now in a slow NSR with competing junctional rhythm. EP recommends amio 400 mg po TID to start. They would also prefer watching over weekend prior to sending to CIR.  5. CAD: s/p CABG 2012. No S/S ischemia.  Off aspirin due to GI bleeding. No change.   6. Gout: Severe tophaceous gout. No complaint of gouty pain today.   - Continue uloric.  7. Atrial fibrillation: Developed atrial fibrillation with RVR in setting of aspiration PNA on 7/4. - Currently NSR.   - On IV amio currently. No further VT since 05/14/17.   8. Malnutrition: Now off TPN. Per Speech- FEEs performed. Continue  full liquids. ST to continue to follow.  Encourage po intake, doing better. No change.   9. Aspiration PNA with acute hypoxemic respiratory failure on 7/4: He was extubated on 7/6. Tracheal aspirate with Klebsiella and citrobacter. Both sensitive to Zosyn.  - Completed abx 7/13. No change.  10. Ileus: Resolved. Getting Reglan. No change. 11. Deconditioning:  Continue PT. Plan for CIR when stable. Need to make sure he is  not having any more VT on po amio prior to sending. EP agrees. 12. Acute Respiratory Failure: Intubated 7/17 in setting of complex GI intervention. Extubated 7/18. On 4 liters sats stable. No change. 13. Delirium: Resolved. No further. Appreciate neuro input Possibly related to lidocaine.  Lidocaine stopped. Confusion resolved. CT of head negative. EEG ok. No further workup per neuro. No change.  14. ID: WBCs stable this am.    15. Insomnia: Added trazodone prn. No change.    Mobilize cautiously and encouraged nutrition. Plan for CIR, but need to make sure stable on po amio first. May need to wait until early next week. Could consider stepdown.    I reviewed the HVAD parameters from today, and compared the results to the patient's prior recorded data.  No programming changes were made.  The HVAD is functioning within specified parameters.    Graciella Freer, PA-C 05/16/2017, 7:46 AM  VAD Team --- VAD ISSUES ONLY--- Pager [phone] (7am - 7am) Advanced Heart Failure Team  Pager [phone] (M-F; 7a - 4p)  Please contact [facility] for night-coverage after hours (4p -7a ) and weekends on [email or web address]  Patient seen with PA, agree with the above note.  He remains out of VT.  Walked last night and this am.  Eating well.  CVP 7-8, hold off on Lasix for now (has VT with rapid fluid shifts => when he last got IV Lasix).   Today, I will switch him to amiodarone 400 mg tid (discussed with EP).  Will watch in ICU on oral med today.  If no more VT, can go to step down tomorrow.  If stays steady out of VT, early next week to CIR.   Creatinine stable.   Hemoglobin stable, no overt bleeding.  Off ASA, warfarin goal INR 2-2.5.    MAP around 90, will increase hydralazine to 50 mg tid with Imdur 60 mg daily.   HVAD parameters stable.   Marca Ancona 05/16/2017 8:31 AM

## 2017-05-16 NOTE — Progress Notes (Signed)
ANTICOAGULATION CONSULT NOTE - Follow Up Consult  Pharmacy Consult for Warfarin  Indication: HVAD  No Active Allergies  Patient Measurements: Height: 6' (182.9 cm) Weight: 246 lb 4.1 oz (111.7 kg) IBW/kg (Calculated) : 77.6  Vital Signs: Temp: 98.1 F (36.7 C) (07/26 2000) Temp Source: Oral (07/26 2000) BP: 105/84 (07/27 0600) Pulse Rate: 61 (07/27 0700)  Labs:  Recent Labs  05/14/17 0528 05/14/17 1107 05/15/17 0500 05/16/17 0330  HGB 8.0* 8.8* 8.4* 8.2*  HCT 25.1* 28.2* 26.1* 25.8*  PLT 297 346 333 323  LABPROT 24.2*  --  27.0* 28.3*  INR 2.13  --  2.45 2.59  CREATININE 1.62* 1.55* 1.59* 1.60*    Estimated Creatinine Clearance: 68.1 mL/min (A) (by C-G formula based on SCr of 1.6 mg/dL (H)).   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 He developed an ileus on 7/5, warfarin was held, and heparin bridge started once INR fell below 1.8.  Heparin stopped 7/10 with INR 2.19. On 7/13, Hgb dropped and he had melena - warfarin held again and pharmacy asked to start heparin once INR < 1.8 pending GI workup.   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   Warfarin resumed 7/18 without heparin bridge. INR  trend up, now just above goal range at 2.59. Hgb 8 low stable - has received multiple PRBCs. Diet advanced.  Remains on Amiodarone drip> po> back on drip post iv boluses 7/25 for VT and increased mexilitine dose.  Goal of Therapy:  INR 2-2.5 Monitor platelets by anticoagulation protocol: Yes   Plan:  1) Warfarin 1 mg tonight 2) Daily INR  Sheppard CoilFrank Annsleigh Dragoo PharmD., BCPS Clinical Pharmacist Pager [phone] 05/16/2017 7:52 AM

## 2017-05-16 NOTE — Progress Notes (Signed)
  Speech Language Pathology Treatment: Dysphagia  Patient Details Name: James [name] MRN: [id number] DOB: 09-01-1961 Today's Date: 05/16/2017 Time: 3295-1884 SLP Time Calculation (min) (ACUTE ONLY): 8 min  Assessment / Plan / Recommendation Clinical Impression  Pt has been tolerating a soft diet, thin liquids for six days.  Lungs remain clear.  He reports fewer symptoms of globus.  Prefers to remain on soft diet.  Continues to demonstrate clinical improvements in swallow function at bedside; oropharyngeal component appears to have resolved.  Our services will sign off.  If further esophageal complaints arise, he may benefit from esophagram.   HPI HPI: 56 year old male admitted 03/31/2017 for LVAD placement. PMH significant for gout, DM2, HTN, CKD, CAD s/p CABGx6 (2012), systemic heart failure, St. Jude ICD. Pt has had 3 intubations this admission.        SLP Plan  All goals met       Recommendations  Diet recommendations: Thin liquid (soft) Liquids provided via: Straw Medication Administration: Whole meds with puree Supervision: Patient able to self feed Compensations: Slow rate;Small sips/bites Postural Changes and/or Swallow Maneuvers: Seated upright 90 degrees;Upright 30-60 min after meal                Oral Care Recommendations: Oral care BID Plan: All goals met       GO                Juan Quam Laurice 05/16/2017, 4:42 PM

## 2017-05-16 NOTE — Progress Notes (Signed)
Patient ID: James [name], male   DOB: 10-02-1961, 56 y.o.   MRN: [id number]  HVAD Rounding Note  Subjective:    He feels ok. Ate well this am. Walked early this am. No chest pain or shortness of breath.  Co-ox 73%   LVAD INTERROGATION:  HVAD:  Flow 4.5 liters/min, speed 2660, power 4  Objective:    Vital Signs:   Temp:  [97.7 F (36.5 C)-98.2 F (36.8 C)] 97.9 F (36.6 C) (07/27 0805) Pulse Rate:  [27-69] 28 (07/27 0805) Resp:  [19-31] 22 (07/27 0805) BP: (90-119)/(76-93) 108/81 (07/27 0805) SpO2:  [83 %-100 %] 98 % (07/27 0805) Weight:  [111.7 kg (246 lb 4.1 oz)] 111.7 kg (246 lb 4.1 oz) (07/27 0615) Last BM Date: 05/15/17 Mean arterial Pressure 90's  Intake/Output:   Intake/Output Summary (Last 24 hours) at 05/16/17 1140 Last data filed at 05/16/17 0700  Gross per 24 hour  Intake             1014 ml  Output              500 ml  Net              514 ml     Physical Exam: General:  Fatigued. No resp difficulty HEENT: normal Cor: distant heart sounds with LVAD hum present. Lungs: clear Abdomen: soft, nontender, nondistended. No hepatosplenomegaly. No bruits or masses. Good bowel sounds. Extremities: marked edema to knees bilat. Neuro: alert & orientedx3, cranial nerves grossly intact. moves all 4 extremities w/o difficulty. Affect pleasant  Telemetry: sinus 60's, no further VT  Labs: Basic Metabolic Panel:  Recent Labs Lab 05/11/17 0326  05/13/17 0438 05/14/17 0528 05/14/17 1107 05/15/17 0500 05/16/17 0330  NA 137  < > 135 136 136 135 134*  K 3.9  < > 3.9 3.8 3.7 3.9 3.9  CL 112*  < > 111 111 112* 110 108  CO2 19*  < > 19* 20* 19* 20* 20*  GLUCOSE 110*  < > 131* 87 105* 125* 152*  BUN 54*  < > 48* 45* 43* 42* 40*  CREATININE 1.56*  < > 1.62* 1.62* 1.55* 1.59* 1.60*  CALCIUM 7.6*  < > 7.5* 7.6* 7.7* 7.6* 7.6*  MG 2.0  --   --   --  1.8  --   --   < > = values in this interval not displayed.  Liver Function Tests:  Recent Labs Lab 05/10/17 0408   AST 19  ALT 12*  ALKPHOS 77  BILITOT 0.7  PROT 4.1*  ALBUMIN 1.6*   No results for input(s): LIPASE, AMYLASE in the last 168 hours.  Recent Labs Lab 05/09/17 1804  AMMONIA 34    CBC:  Recent Labs Lab 05/12/17 0413 05/13/17 0438 05/14/17 0528 05/14/17 1107 05/15/17 0500 05/16/17 0330  WBC 17.8* 9.8 6.8 7.2 5.8 4.9  NEUTROABS 15.6*  --   --   --   --   --   HGB 8.7* 8.1* 8.0* 8.8* 8.4* 8.2*  HCT 26.9* 25.2* 25.1* 28.2* 26.1* 25.8*  MCV 89.7 89.7 91.3 90.7 90.0 89.6  PLT 309 284 297 346 333 323    INR:  Recent Labs Lab 05/12/17 0413 05/13/17 0438 05/14/17 0528 05/15/17 0500 05/16/17 0330  INR 2.25 2.11 2.13 2.45 2.59   LDH: 164  Other results:  EKG:   Imaging:  No results found.   Medications:     Scheduled Medications: . amiodarone  400 mg Oral Q8H  .  Chlorhexidine Gluconate Cloth  6 each Topical Daily  . febuxostat  120 mg Oral Daily  . feeding supplement  1 Container Oral BID BM  . feeding supplement (ENSURE ENLIVE)  237 mL Oral Q24H  . fentaNYL (SUBLIMAZE) injection  50 mcg Intravenous Once  . hydrALAZINE  50 mg Oral Q8H  . insulin aspart  0-15 Units Subcutaneous TID WC  . isosorbide mononitrate  30 mg Oral Daily  . metoCLOPramide  5 mg Oral TID AC  . mexiletine  300 mg Oral Q8H  . pantoprazole  40 mg Oral BID  . sodium chloride flush  10-40 mL Intracatheter Q12H  . traZODone  50 mg Oral QHS  . warfarin  1 mg Oral ONCE-1800  . Warfarin - Pharmacist Dosing Inpatient   Does not apply q1800     Infusions: . sodium chloride Stopped (05/02/17 0630)  . sodium chloride 10 mL/hr at 05/16/17 0700  . fentaNYL infusion INTRAVENOUS 100 mcg/hr (05/07/17 1600)     PRN Medications:  albuterol, docusate, fentaNYL, hydrALAZINE, neomycin-bacitracin-polymyxin, ondansetron (ZOFRAN) IV, oxyCODONE, phenol, sodium chloride flush, traMADol    Assessment/Plan/Discussion:    POD 29s/p HVAD for ischemic cardiomyopathy with acute on chronic  systolic heart failure, EF 15% with moderate RV dysfunction. Returned to OR for bleeding from raw mediastinal tissues.  He has been hemodynamically stable off inotropes with Co-ox 73%.  Stage 3 CKD: creat stable at 1.60 on no diuretic  GI bleed: appears resolved after endoscopic treatment of duodenal AVM vs Dieulafoy lesion. Continuing IV protonix and will maintain INR 2-2.5 without ASA.  Acute respiratory failure due to aspiration pneumonia resolved.  Postop ileus resolved  INR therapeutic at 2.59. Pharmacy is managing   DM: preop Hgb A1c 5.9. Glucose under good control. ContinueLevemir and SSI.  Gout: observe  Hx of arrhythmias preop on amio and postopsustained VTpossibly due to suction events with diuresis and volume shifts. He was stable in sinus on amio and mexiletine but developed recurrent VT a couple days ago requiring pacing and restarted IV amio. CVP was 9 this am and he is not being diuresed due to VT. He still has a lot of extra volume on board with marked bilateral leg edema. ICD in place but will wait to turn on therapies. He was switched back to oral amio this am and will observe.  Severe malnutrition with prealbumin <5: Encourage PO intake  Physical therapy. Hopefully can get to CIR Monday if he remains in sinus on oral meds.  I reviewed the LVAD parameters from today, and compared the results to the patient's prior recorded data. LVAD equipment check completed andis in good working order. Back-up equipment present.    Length of Stay: [address]35  James [name] 05/16/2017, 11:40 AM

## 2017-05-16 NOTE — Progress Notes (Signed)
Physical Therapy Treatment Patient Details Name: James FabianHarry D [name] MRN: [id number] DOB: 21-Jul-1961 Today's Date: 05/16/2017    History of Present Illness  Pt adm with acute on chronic heart failure and underwent Heartware HVAD implant on 6/28. Returned to OR later that day for evacuation of mediastinal hematoma. On 7/4 developed ileus with likely aspiration and intubated 7/4-7/6.   Pt developed Heme + stools with progressive anemia as well as  Kirby FunkVtach 05/02/17.  He underwent EGD with showed suspected AVM 01/04/2017. He was intubated for procedure and extubated 05/08/17 PMH - gout, DMII, HTN, CKD, CAD S/P CABGx6 2012, chronic systolic heart failure and St jude ICD.     PT Comments    Pt agreeable to mobilize with PT. Pt continues to require a heavy amount of assist with bed mobility and transfers; however requires less assistance for ambulating with RW around unit. Pt presents with decreased short term memory and continues to need VCs for use of heartware (controller management, power change and backup bag). Pt re-educated on these. Will follow acutely for mobilization, increased activity tolerance, education on heartware management, and strengthening. CIR continues to remain appropriate next step to progress towards PLOF.   Vitals:  Pre ambulation: HR 68 MAP 94 Post ambulation: HR 65 MAP 94   Follow Up Recommendations  CIR;Supervision for mobility/OOB     Equipment Recommendations  Rolling walker with 5" wheels;3in1 (PT)    Recommendations for Other Services       Precautions / Restrictions Precautions Precautions: Sternal Precaution Comments: Heartware HVAD Restrictions Weight Bearing Restrictions: Yes Other Position/Activity Restrictions: Sternal precautions    Mobility  Bed Mobility Overal bed mobility: Needs Assistance Bed Mobility: Rolling;Sidelying to Sit Rolling: Mod assist Sidelying to sit: Mod assist       General bed mobility comments: Mod A for rise; VCs for sequencing    Transfers Overall transfer level: Needs assistance   Transfers: Sit to/from Stand Sit to Stand: Mod assist;+2 physical assistance         General transfer comment: Mod A + 2 for rise. On first attempt, pt had to sit back down and try again. Pt with heavy posterior lean. VCs for hand placement   Ambulation/Gait Ambulation/Gait assistance: Min assist;+2 safety/equipment Ambulation Distance (Feet): 200 Feet     Gait velocity: decreased Gait velocity interpretation: Below normal speed for age/gender General Gait Details: Min A to maintain stability. Recommended chair follow. +2 for chair follow and safety equipment    Stairs            Wheelchair Mobility    Modified Rankin (Stroke Patients Only)       Balance Overall balance assessment: Needs assistance Sitting-balance support: Feet supported;No upper extremity supported Sitting balance-Leahy Scale: Fair     Standing balance support: Bilateral upper extremity supported;During functional activity Standing balance-Leahy Scale: Poor Standing balance comment: reliant on RW                             Cognition Arousal/Alertness: Awake/alert Behavior During Therapy: Flat affect Overall Cognitive Status: Impaired/Different from baseline Area of Impairment: Problem solving;Awareness;Safety/judgement;Memory                   Current Attention Level: Selective Memory: Decreased recall of precautions;Decreased short-term memory Following Commands: Follows one step commands with increased time Safety/Judgement: Decreased awareness of safety   Problem Solving: Requires verbal cues;Decreased initiation;Slow processing;Difficulty sequencing        Exercises  General Exercises - Lower Extremity Ankle Circles/Pumps: AROM;10 reps;Seated;Both Long Arc Quad: AROM;Both;10 reps;Seated Hip Flexion/Marching: Both;10 reps;Seated;AAROM    General Comments        Pertinent Vitals/Pain Pain Assessment:  0-10 Pain Score: 7  Pain Location: Legs  Pain Descriptors / Indicators: Discomfort Pain Intervention(s): Limited activity within patient's tolerance;Monitored during session;Repositioned    Home Living                      Prior Function            PT Goals (current goals can now be found in the care plan section) Acute Rehab PT Goals Patient Stated Goal: to go home PT Goal Formulation: With patient Time For Goal Achievement: 05/30/17 Potential to Achieve Goals: Good Progress towards PT goals: Progressing toward goals (pt progressing with gait goal and others updated)    Frequency    Min 3X/week      PT Plan Current plan remains appropriate    Co-evaluation              AM-PAC PT "6 Clicks" Daily Activity  Outcome Measure  Difficulty turning over in bed (including adjusting bedclothes, sheets and blankets)?: A Lot Difficulty moving from lying on back to sitting on the side of the bed? : Total Difficulty sitting down on and standing up from a chair with arms (e.g., wheelchair, bedside commode, etc,.)?: Total Help needed moving to and from a bed to chair (including a wheelchair)?: A Lot Help needed walking in hospital room?: A Little Help needed climbing 3-5 steps with a railing? : A Lot 6 Click Score: 11    End of Session Equipment Utilized During Treatment: Gait belt Activity Tolerance: Patient tolerated treatment well Patient left: in chair;with call bell/phone within reach;with chair alarm set Nurse Communication: Mobility status PT Visit Diagnosis: Unsteadiness on feet (R26.81);Muscle weakness (generalized) (M62.81);Difficulty in walking, not elsewhere classified (R26.2);Other abnormalities of gait and mobility (R26.89);Pain Pain - Right/Left: Right Pain - part of body: Leg     Time: [id number] PT Time Calculation (min) (ACUTE ONLY): 35 min  Charges:  $Gait Training: 8-22 mins $Therapeutic Exercise: 8-22 mins                    G Codes:        Arneta ClicheVictoria Ahmani Daoud, SPT Acute Rehab [phone]    Arneta ClicheVictoria Chrystine Frogge 05/16/2017, 1:50 PM

## 2017-05-16 NOTE — Progress Notes (Signed)
Chaplain stopped by for a follow-up visit with the patient.  Patient is resting better he says.  Chaplain provided space for patient to talk about what he is feeling and allows space for him to reflect and respond to his therapy and medical care.  Patient is expressing that he is tired but must keep fighting.  Chaplain supports the decision of the patient and will continue to visit as needed, desired or requested.      05/16/17 1137  Clinical Encounter Type  Visited With Patient  Visit Type Follow-up;Spiritual support

## 2017-05-16 NOTE — Progress Notes (Signed)
Inpatient Rehabilitation  Continuing to follow along for timing of anticipated medical readiness, insurance authorization, and IP Rehab bed availability. Plan to follow up on Monday.  Please call with questions.   Charlane FerrettiMelissa Shaunte Weissinger, M.A., CCC/SLP Admission Coordinator  [facility]  Cell [phone]

## 2017-05-17 DIAGNOSIS — L8990 Pressure ulcer of unspecified site, unspecified stage: Secondary | ICD-10-CM | POA: Insufficient documentation

## 2017-05-17 LAB — PROTIME-INR
INR: 2.45
PROTHROMBIN TIME: 27 s — AB (ref 11.4–15.2)

## 2017-05-17 LAB — GLUCOSE, CAPILLARY
GLUCOSE-CAPILLARY: 119 mg/dL — AB (ref 65–99)
GLUCOSE-CAPILLARY: 135 mg/dL — AB (ref 65–99)
Glucose-Capillary: 112 mg/dL — ABNORMAL HIGH (ref 65–99)

## 2017-05-17 LAB — CBC
HEMATOCRIT: 25.3 % — AB (ref 39.0–52.0)
HEMOGLOBIN: 8.3 g/dL — AB (ref 13.0–17.0)
MCH: 29.4 pg (ref 26.0–34.0)
MCHC: 32.8 g/dL (ref 30.0–36.0)
MCV: 89.7 fL (ref 78.0–100.0)
Platelets: 314 10*3/uL (ref 150–400)
RBC: 2.82 MIL/uL — ABNORMAL LOW (ref 4.22–5.81)
RDW: 18 % — ABNORMAL HIGH (ref 11.5–15.5)
WBC: 4.6 10*3/uL (ref 4.0–10.5)

## 2017-05-17 LAB — COOXEMETRY PANEL
Carboxyhemoglobin: 1.3 % (ref 0.5–1.5)
Methemoglobin: 1.2 % (ref 0.0–1.5)
O2 Saturation: 77.5 %
Total hemoglobin: 8 g/dL — ABNORMAL LOW (ref 12.0–16.0)

## 2017-05-17 LAB — BASIC METABOLIC PANEL
Anion gap: 6 (ref 5–15)
BUN: 37 mg/dL — AB (ref 6–20)
CHLORIDE: 110 mmol/L (ref 101–111)
CO2: 18 mmol/L — ABNORMAL LOW (ref 22–32)
Calcium: 7.6 mg/dL — ABNORMAL LOW (ref 8.9–10.3)
Creatinine, Ser: 1.61 mg/dL — ABNORMAL HIGH (ref 0.61–1.24)
GFR calc non Af Amer: 47 mL/min — ABNORMAL LOW (ref >60)
GFR, EST AFRICAN AMERICAN: 54 mL/min — AB (ref >60)
Glucose, Bld: 73 mg/dL (ref 65–99)
POTASSIUM: 3.9 mmol/L (ref 3.5–5.1)
SODIUM: 134 mmol/L — AB (ref 135–145)

## 2017-05-17 LAB — LACTATE DEHYDROGENASE: LDH: 165 U/L (ref 98–192)

## 2017-05-17 MED ORDER — SODIUM CHLORIDE 0.9 % IV SOLN
510.0000 mg | INTRAVENOUS | Status: AC
  Administered 2017-05-17 – 2017-05-20 (×2): 510 mg via INTRAVENOUS
  Filled 2017-05-17 (×3): qty 17

## 2017-05-17 MED ORDER — WARFARIN SODIUM 2 MG PO TABS
2.0000 mg | ORAL_TABLET | Freq: Once | ORAL | Status: AC
  Administered 2017-05-17: 2 mg via ORAL
  Filled 2017-05-17: qty 1

## 2017-05-17 MED ORDER — SPIRONOLACTONE 25 MG PO TABS
12.5000 mg | ORAL_TABLET | Freq: Every day | ORAL | Status: DC
  Administered 2017-05-17 – 2017-05-20 (×4): 12.5 mg via ORAL
  Filled 2017-05-17 (×4): qty 1

## 2017-05-17 MED ORDER — MIRTAZAPINE 15 MG PO TABS
15.0000 mg | ORAL_TABLET | Freq: Every day | ORAL | Status: DC
  Administered 2017-05-17 – 2017-05-29 (×12): 15 mg via ORAL
  Filled 2017-05-17 (×12): qty 1

## 2017-05-17 NOTE — Progress Notes (Signed)
05/17/2017 1745 Received pt to room 4E17 from 2H.  Pt is A&O, no C/O voiced at this time.  Tele monitor applied and CCMD notified.  HVAD settings verified, emergency equipment checked.  Pt is on battery at this time.  Oriented to room, call light and bed.  Call bell in reach, family at bedside. Kathryne HitchAllen, Valentino Saavedra C

## 2017-05-17 NOTE — Progress Notes (Signed)
Pt refused to walk this morning. Stood up on scale and said 'my legs do not feel right". Patient encouraged to walk when he feels better

## 2017-05-17 NOTE — Progress Notes (Signed)
ANTICOAGULATION CONSULT NOTE - Follow Up Consult  Pharmacy Consult for Warfarin  Indication: HVAD  No Active Allergies  Patient Measurements: Height: 6' (182.9 cm) Weight: 238 lb 15.7 oz (108.4 kg) IBW/kg (Calculated) : 77.6  Vital Signs: Temp: 98.1 F (36.7 C) (07/28 0431) Temp Source: Oral (07/28 0431) BP: 104/85 (07/28 0600) Pulse Rate: 73 (07/28 0609)  Labs:  Recent Labs  05/15/17 0500 05/16/17 0330 05/17/17 0427  HGB 8.4* 8.2* 8.3*  HCT 26.1* 25.8* 25.3*  PLT 333 323 314  LABPROT 27.0* 28.3* 27.0*  INR 2.45 2.59 2.45  CREATININE 1.59* 1.60* 1.61*    Estimated Creatinine Clearance: 66.7 mL/min (A) (by C-G formula based on SCr of 1.61 mg/dL (H)).   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 He developed an ileus on 7/5, warfarin was held, and heparin bridge started once INR fell below 1.8.  Heparin stopped 7/10 with INR 2.19. On 7/13, Hgb dropped and he had melena - warfarin held again and pharmacy asked to start heparin once INR < 1.8 pending GI workup.   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   Warfarin resumed 7/18 without heparin bridge. INR  trend up 2.45 at goal Hgb 8 low stable - has received multiple PRBCs. Diet advanced.  Remains on Amiodarone drip> po> back on drip post iv boluses 7/25 for VT > po increased dose 400mg  TID and increased mexilitine dose.  Goal of Therapy:  INR 2-2.5 Monitor platelets by anticoagulation protocol: Yes   Plan:  1) Warfarin 2 mg tonight 2) Daily INR  Leota SauersLisa Salahuddin Arismendez Pharm.D. CPP, BCPS Clinical Pharmacist [phone] 05/17/2017 8:32 AM

## 2017-05-17 NOTE — Progress Notes (Addendum)
Patient ID: James [name], male   DOB: July 13, 1961, 56 y.o.   MRN: [id number]   Advanced Heart Failure VAD Team Note  Subjective:    Events: --HVAD placed 6/28.  Returned to the OR that evening with high chest tube output, evacuation of mediastinal hematoma.  --Extubated 6/29. Milrinone stopped on 7/4. --7/4, patient developed ileus with respiratory compromise. NGT placed with 3 L suctioned out.  CXR with suspicion for aspiration PNA.  Patient had to be intubated.  He went into atrial fibrillation with RVR.  He became hypotensive and was started on norepinephrine and phenylephrine.  Amiodarone gtt begun.    --Extubated again on 7/6.  -- 7/13 Had 2 sustained episodes of VT. Had to be cardioverted 1. Second episode broke with overdrive pacing with Dr. Graciela HusbandsKlein. VAD speed turned down to 2700.  --Melena due to acuteGI bleed --> 2 units PRBCs 7/14, 2 units 7/15. 3 U PRBCs 7/16,  05/10/2017 3 UPRBCs.  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  He had epinephrine, APC, and 2 clips. Intubated prior to procedure and placed on norepinephrine. Speed dropped to 2660.  --7/18 Back in VT with overdrive pacing. Lidocaine was started in addition to amiodarone. Extubated. --7/19 lidocaine stopped with confusion. Neuro consulted.  EEG normal.  CT of head no acute findings. 7/20 confusion had resolved.  --1 unit PRBCs 7/20.  --7/22, developed sustained VT with rate around 130 shortly after getting IV Lasix.  We were able to pace him out and back to NSR.  He was put back on amiodarone gtt.  --More VT on 7/25, paced out.  Back on IV amiodarone gtt.  NSR.    Switched to po amio 400 tid yesterday. Remains in NSR. CVP 6-7. Walking halls. Remains off diuretics. Weight down 8 pounds overnight (?). Feels ok. No CP or SOB. Coox 78%.  INR 2.45. Hgb 8.3. Iron stores low.  Feels depressed. Wants to get out of ICU and go to SDU and CIR.  HVAD INTERROGATION:  HVAD:  Flow 4.6  liters/min, speed 2660,  power 3.9 W,  Peak 7.2 Trough 2.5 Suction On. Lavare On. No alarms.    Objective:    Vital Signs:   Temp:  [97.6 F (36.4 C)-98.6 F (37 C)] 98.1 F (36.7 C) (07/28 0431) Pulse Rate:  [29-88] 73 (07/28 0609) Resp:  [14-29] 23 (07/28 0609) BP: (92-118)/(60-102) 104/85 (07/28 0600) SpO2:  [91 %-100 %] 100 % (07/28 0609) Weight:  [108.4 kg (238 lb 15.7 oz)] 108.4 kg (238 lb 15.7 oz) (07/28 0500) Last BM Date: 05/16/17 Mean arterial Pressure 85-90  Intake/Output:   Intake/Output Summary (Last 24 hours) at 05/17/17 0909 Last data filed at 05/17/17 [id number]  Gross per 24 hour  Intake           655.05 ml  Output             1026 ml  Net          -370.95 ml     Physical Exam   CVP 6-7  Physical Exam: GENERAL: Fatigued appearing. NAD.  HEENT: Normal. Anicteric.  NECK: Supple, JVP 6-7 CM  Carotids OK.  CARDIAC:  Mechanical heart sounds with LVAD hum present. No rub  LUNGS:  Clear anteriorly  ABDOMEN:  NT/ND good BS. No HSM  LVAD exit site: Well-healed and incorporated. Dressing dry and intact. No erythema or drainage. Stabilization device present and accurately applied. Driveline dressing changed daily per sterile technique. No  signs infection  EXTREMITIES:  Warm and dry. No cyanosis, clubbing, or rash. 2-3+ edema to knees.  Chronic extensive gouty changes to bilateral hands. RU PICC. Wound on RLE  Neuro: alert & oriented x 3, cranial nerves grossly intact. moves all 4 extremities w/o difficulty. Flat affet   Telemetry   Personally reviewed, remains in NSR 50-60s No further VT.   Labs   Basic Metabolic Panel:  Recent Labs Lab 05/11/17 0326  05/14/17 0528 05/14/17 1107 05/15/17 0500 05/16/17 0330 05/17/17 0427  NA 137  < > 136 136 135 134* 134*  K 3.9  < > 3.8 3.7 3.9 3.9 3.9  CL 112*  < > 111 112* 110 108 110  CO2 19*  < > 20* 19* 20* 20* 18*  GLUCOSE 110*  < > 87 105* 125* 152* 73  BUN 54*  < > 45* 43* 42* 40* 37*  CREATININE 1.56*  < >  1.62* 1.55* 1.59* 1.60* 1.61*  CALCIUM 7.6*  < > 7.6* 7.7* 7.6* 7.6* 7.6*  MG 2.0  --   --  1.8  --   --   --   < > = values in this interval not displayed.  Liver Function Tests: No results for input(s): AST, ALT, ALKPHOS, BILITOT, PROT, ALBUMIN in the last 168 hours. No results for input(s): LIPASE, AMYLASE in the last 168 hours. No results for input(s): AMMONIA in the last 168 hours.  CBC:  Recent Labs Lab 05/12/17 0413  05/14/17 0528 05/14/17 1107 05/15/17 0500 05/16/17 0330 05/17/17 0427  WBC 17.8*  < > 6.8 7.2 5.8 4.9 4.6  NEUTROABS 15.6*  --   --   --   --   --   --   HGB 8.7*  < > 8.0* 8.8* 8.4* 8.2* 8.3*  HCT 26.9*  < > 25.1* 28.2* 26.1* 25.8* 25.3*  MCV 89.7  < > 91.3 90.7 90.0 89.6 89.7  PLT 309  < > 297 346 333 323 314  < > = values in this interval not displayed.  INR:  Recent Labs Lab 05/13/17 0438 05/14/17 0528 05/15/17 0500 05/16/17 0330 05/17/17 0427  INR 2.11 2.13 2.45 2.59 2.45    Other results:     Imaging   No results found.   Medications:     Scheduled Medications: . amiodarone  400 mg Oral Q8H  . Chlorhexidine Gluconate Cloth  6 each Topical Daily  . febuxostat  120 mg Oral Daily  . feeding supplement  1 Container Oral BID BM  . feeding supplement (ENSURE ENLIVE)  237 mL Oral Q24H  . fentaNYL (SUBLIMAZE) injection  50 mcg Intravenous Once  . hydrALAZINE  50 mg Oral Q8H  . insulin aspart  0-15 Units Subcutaneous TID WC  . isosorbide mononitrate  30 mg Oral Daily  . metoCLOPramide  5 mg Oral TID AC  . mexiletine  300 mg Oral Q8H  . pantoprazole  40 mg Oral BID  . sodium chloride flush  10-40 mL Intracatheter Q12H  . traZODone  50 mg Oral QHS  . warfarin  2 mg Oral ONCE-1800  . Warfarin - Pharmacist Dosing Inpatient   Does not apply q1800    Infusions: . sodium chloride Stopped (05/02/17 0630)  . sodium chloride 10 mL/hr at 05/16/17 1800  . fentaNYL infusion INTRAVENOUS 100 mcg/hr (05/07/17 1600)  . ferumoxytol       PRN Medications: albuterol, docusate, fentaNYL, hydrALAZINE, neomycin-bacitracin-polymyxin, ondansetron (ZOFRAN) IV, oxyCODONE, phenol, sodium chloride flush, traMADol  Patient Profile   56 yo with CAD s/p CABG, ischemic cardiomyopathy/chronic systolic CHF, tophaceous gout, and CKD stage 3 was admitted for diuresis and consideration for LVAD placement. S/p HVAD on 6/28  Assessment/Plan:    1. Acute/chronic systolic CHF s/p HVAD: Ischemic cardiomyopathy.  St Jude ICD.  Echo (6/18) with EF 15%, mildly dilated RV with moderately decreased systolic function.  s/p HVAD placement 6/28 and had to return to OR to evacuate mediastinal hematoma.  He had been weaned off pressors/milrinone, but developed ileus w/ likely aspiration event 7/4, re-intubated, developed afib/RVR requiring norepinephrine. Extubated 7/6.  VAD speed turned down to 2660 with VT. VAD parameters currently look good.  Milrinone stopped 7/17 early am with recurrent VT.  Now off norepinephrine. No diuretic again yesterday.  - CVP stable ~ 7 this am. (checked personally). Holding on po lasix for now as VT has been closely linked to relative volume depletion. Weight up from post-op baseline but recorded as down 8 pounds overnight.  Has marked LE edema in setting of low albumin and RV failure. Place TED hose  - Off ASA. Back on warfarin, INR 2.45 -> goal now is 2-2.5.  No further bleeding. Discussed with PharmD - Has been off dig/spironolactone/arb with elevated creatinine.  Will try low-dose spiro for gentle fluid removal, HTN and to keep K up. - MAPs improved with Increase of hydralazine to 50 mg TID yesterday. Now 85-90 2. AKI on CKD stage 3: Suspect a component of peri-op ATN, worsened with ileus, vomiting, intubation/sedation.   - Stable at 1.6 this am.   3. Symptomatic anemia due to acute UGI bleeding:  Received 3 units PRBCs 7/16 and 3 units PRBCs on 05/15/17. 1 unit PRBCs 7/20. 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  Requiring 2 clips + epi + APC. Does not appear to be bleeding actively now   - Continue Protonix 40 po bid. - Back on warfarin, aiming for INR 2-2.5 range. Will leave off aspirin for now. Dosing per pharm. - Transfuse hgb < 8. 8.3 this am, fairly stable. No further bleeding. Continue to follow. - Iron stores low. Discussed with PharmD. Wil get Feraheme today.   4. Ventricular tachycardia: Status post ventricular tachycardia 2 on 7/13. Possible suction event. VAD speed turned down to 2700.  Recurrent VT 7/17. EP called to bedside. Overdrive pacing successful for about 10 minutes but VT recurred.  Milrinone turned off, remained in VT overnight but hemodynamically stable.  Speed decreased to 2660.  On 7/18, we were able to pace him out of VT.  Lidocaine added then stopped due to confusion/mental status changes.  He had VT 7/22 about 30 minutes after getting IV Lasix, had to be paced out again => may have been due to rapid fluid shift.  Pt paced out of VT 3 separate occasions 7/25. Got amio bolus x 2 and started back on IV infusion.  - CVP stable ~ 7 without lasix. Following for need for po lasix => try to avoid IV given VT with more rapid fluid shifts. May try lasix tomorrow.  - Off lidocaine with confusion. Now on mexiletine up to 300 mg every 8 hrs and amio 400 TID per EP.  - No VT overnight 5. CAD: s/p CABG 2012. No S/S ischemia.  Off aspirin due to GI bleeding. No change.   6. Gout: Severe tophaceous gout. No complaint of gouty pain today.   - Continue uloric.  7. Atrial fibrillation: Developed atrial  fibrillation with RVR in setting of aspiration PNA on 7/4. - Currently NSR.   - Switched to po amio 400 tid on 7/27. No further AF/VT since 05/14/17.   - Keep K> 4.0 Mg > 2.0 8. Malnutrition: Now off TPN. Per Speech- FEEs performed. Continue full liquids. ST to continue to follow.  Encourage po intake, doing better. No change.   9. Aspiration PNA with acute hypoxemic  respiratory failure on 7/4: He was extubated on 7/6. Tracheal aspirate with Klebsiella and citrobacter. Both sensitive to Zosyn.  - Completed abx 7/13. No s/sa ctive infection 10. Ileus: Resolved. Getting Reglan. No change. 11. Deconditioning:  Continue PT. Walking unit with assistance. Plan for CIR when stable. Need to make sure he is not having any more VT on po amio prior to sending. EP input appreciated 12. Acute Respiratory Failure: Intubated 7/17 in setting of complex GI intervention. Extubated 7/18. On 4 liters sats stable. No change. 13. Delirium: Resolved. No further. Appreciate neuro input Possibly related to lidocaine.  Lidocaine stopped. Confusion resolved. CT of head negative. EEG ok. No further workup per neuro. No change today.  14. ID: No s/s of active infection WBC 4.6k 15. Insomnia: Added trazodone. D/w PharmD 16. Post-op depression - Add mirtazipine. D/w PharmD and Dr. PVT.   He is anxious to go to SDU today. D/W EDr. PVT. Will transfer to 4E. Continue to mobilize and encourage po intake.Plan for CIR, but need to make sure stable on po amio first. May need to wait until Mon/Tues.   I reviewed the HVAD parameters from today, and compared the results to the patient's prior recorded data.  No programming changes were made.  The HVAD is functioning within specified parameters.    Arvilla Meres, MD 05/17/2017, 9:09 AM  VAD Team --- VAD ISSUES ONLY--- Pager [phone] (7am - 7am) Advanced Heart Failure Team  Pager [phone] (M-F; 7a - 4p)  Please contact [facility] for night-coverage after hours (4p -7a ) and weekends on [email or web address]

## 2017-05-17 NOTE — Progress Notes (Signed)
11 Days Post-Op Procedure(s) (LRB): ENTEROSCOPY (N/A) HOT HEMOSTASIS (ARGON PLASMA COAGULATION/BICAP) (N/A) Subjective: Now nsr on hi dose po amiodarone No VT ovwernite Good VAD pulsatility tx to stepdown cvp 8 Objective: Vital signs in last 24 hours: Temp:  [97.6 F (36.4 C)-98.6 F (37 C)] 98.1 F (36.7 C) (07/28 0431) Pulse Rate:  [29-88] 73 (07/28 0609) Cardiac Rhythm: Normal sinus rhythm (07/28 0609) Resp:  [14-29] 23 (07/28 0609) BP: (92-118)/(60-102) 104/85 (07/28 0600) SpO2:  [91 %-100 %] 100 % (07/28 0609) Weight:  [238 lb 15.7 oz (108.4 kg)] 238 lb 15.7 oz (108.4 kg) (07/28 0500)  Hemodynamic parameters for last 24 hours: CVP:  [9 mmHg] 9 mmHg  Intake/Output from previous day: 07/27 0701 - 07/28 0700 In: 948.5 [P.O.:720; I.V.:228.5] Out: 1026 [Urine:1025; Stool:1] Intake/Output this shift: No intake/output data recorded.       Exam    General- alert , anxious,depressed   Incisions clean, dry   Lungs- clear without rales, wheezes   Cor- regular rate and rhythm, no murmur , gallop   Abdomen- soft, non-tender   Extremities - warm, non-tender, 3+ stable edema   Neuro- oriented, appropriate, no focal weakness   Lab Results:  Recent Labs  05/16/17 0330 05/17/17 0427  WBC 4.9 4.6  HGB 8.2* 8.3*  HCT 25.8* 25.3*  PLT 323 314   BMET:  Recent Labs  05/16/17 0330 05/17/17 0427  NA 134* 134*  K 3.9 3.9  CL 108 110  CO2 20* 18*  GLUCOSE 152* 73  BUN 40* 37*  CREATININE 1.60* 1.61*  CALCIUM 7.6* 7.6*    PT/INR:  Recent Labs  05/17/17 0427  LABPROT 27.0*  INR 2.45   ABG    Component Value Date/Time   PHART 7.406 04/28/2017 0407   HCO3 20.2 04/28/2017 0407   TCO2 22 05/02/2017 1628   ACIDBASEDEF 4.0 (H) 04/28/2017 0407   O2SAT 77.5 05/17/2017 0430   CBG (last 3)   Recent Labs  05/16/17 1623 05/16/17 2126 05/17/17 0758  GLUCAP 163* 90 119*    Assessment/Plan: S/P Procedure(s) (LRB): ENTEROSCOPY (N/A) HOT HEMOSTASIS (ARGON  PLASMA COAGULATION/BICAP) (N/A) iv Iron  Lasix per HF Cont with rehab therapies, VAD teaching   LOS: 36 days    James [name] 05/17/2017

## 2017-05-18 ENCOUNTER — Inpatient Hospital Stay (HOSPITAL_COMMUNITY): Payer: Medicare PPO

## 2017-05-18 DIAGNOSIS — M10012 Idiopathic gout, left shoulder: Secondary | ICD-10-CM

## 2017-05-18 LAB — GLUCOSE, CAPILLARY
GLUCOSE-CAPILLARY: 91 mg/dL (ref 65–99)
GLUCOSE-CAPILLARY: 126 mg/dL — AB (ref 65–99)
Glucose-Capillary: 88 mg/dL (ref 65–99)

## 2017-05-18 LAB — CBC
HCT: 25.6 % — ABNORMAL LOW (ref 39.0–52.0)
Hemoglobin: 8.2 g/dL — ABNORMAL LOW (ref 13.0–17.0)
MCH: 28.5 pg (ref 26.0–34.0)
MCHC: 32 g/dL (ref 30.0–36.0)
MCV: 88.9 fL (ref 78.0–100.0)
PLATELETS: 348 10*3/uL (ref 150–400)
RBC: 2.88 MIL/uL — AB (ref 4.22–5.81)
RDW: 18 % — ABNORMAL HIGH (ref 11.5–15.5)
WBC: 7.1 10*3/uL (ref 4.0–10.5)

## 2017-05-18 LAB — COOXEMETRY PANEL
CARBOXYHEMOGLOBIN: 1.3 % (ref 0.5–1.5)
Methemoglobin: 1.3 % (ref 0.0–1.5)
O2 SAT: 72.7 %
TOTAL HEMOGLOBIN: 8.4 g/dL — AB (ref 12.0–16.0)

## 2017-05-18 LAB — MAGNESIUM: MAGNESIUM: 1.8 mg/dL (ref 1.7–2.4)

## 2017-05-18 LAB — BASIC METABOLIC PANEL
Anion gap: 6 (ref 5–15)
BUN: 38 mg/dL — AB (ref 6–20)
CO2: 19 mmol/L — ABNORMAL LOW (ref 22–32)
CREATININE: 1.65 mg/dL — AB (ref 0.61–1.24)
Calcium: 7.7 mg/dL — ABNORMAL LOW (ref 8.9–10.3)
Chloride: 109 mmol/L (ref 101–111)
GFR calc Af Amer: 53 mL/min — ABNORMAL LOW (ref >60)
GFR, EST NON AFRICAN AMERICAN: 46 mL/min — AB (ref >60)
GLUCOSE: 92 mg/dL (ref 65–99)
POTASSIUM: 3.8 mmol/L (ref 3.5–5.1)
SODIUM: 134 mmol/L — AB (ref 135–145)

## 2017-05-18 LAB — PROTIME-INR
INR: 2.53
Prothrombin Time: 27.7 seconds — ABNORMAL HIGH (ref 11.4–15.2)

## 2017-05-18 LAB — LACTATE DEHYDROGENASE: LDH: 184 U/L (ref 98–192)

## 2017-05-18 MED ORDER — PREDNISONE 20 MG PO TABS
40.0000 mg | ORAL_TABLET | Freq: Every day | ORAL | Status: AC
  Administered 2017-05-18 – 2017-05-20 (×3): 40 mg via ORAL
  Filled 2017-05-18 (×3): qty 2

## 2017-05-18 MED ORDER — MAGNESIUM SULFATE 2 GM/50ML IV SOLN
2.0000 g | Freq: Once | INTRAVENOUS | Status: AC
  Administered 2017-05-18: 2 g via INTRAVENOUS
  Filled 2017-05-18: qty 50

## 2017-05-18 MED ORDER — POTASSIUM CHLORIDE CRYS ER 20 MEQ PO TBCR
40.0000 meq | EXTENDED_RELEASE_TABLET | Freq: Once | ORAL | Status: AC
  Administered 2017-05-18: 40 meq via ORAL
  Filled 2017-05-18: qty 2

## 2017-05-18 MED ORDER — WARFARIN SODIUM 1 MG PO TABS
1.0000 mg | ORAL_TABLET | Freq: Once | ORAL | Status: AC
  Administered 2017-05-18: 1 mg via ORAL
  Filled 2017-05-18: qty 1

## 2017-05-18 MED ORDER — AMIODARONE IV BOLUS ONLY 150 MG/100ML
150.0000 mg | Freq: Once | INTRAVENOUS | Status: AC
  Administered 2017-05-18: 150 mg via INTRAVENOUS
  Filled 2017-05-18: qty 100

## 2017-05-18 MED ORDER — TORSEMIDE 20 MG PO TABS
20.0000 mg | ORAL_TABLET | Freq: Every day | ORAL | Status: DC
  Administered 2017-05-18 – 2017-05-19 (×2): 20 mg via ORAL
  Filled 2017-05-18 (×2): qty 1

## 2017-05-18 NOTE — Progress Notes (Signed)
ANTICOAGULATION CONSULT NOTE - Follow Up Consult  Pharmacy Consult for Warfarin  Indication: HVAD  No Active Allergies  Patient Measurements: Height: 6' (182.9 cm) Weight: 241 lb 4.8 oz (109.5 kg) IBW/kg (Calculated) : 77.6  Vital Signs: Temp: 98.7 F (37.1 C) (07/29 0800) Temp Source: Oral (07/29 0800) BP: 86/0 (07/29 0914) Pulse Rate: 68 (07/29 0800)  Labs:  Recent Labs  05/16/17 0330 05/17/17 0427 05/18/17 0521  HGB 8.2* 8.3* 8.2*  HCT 25.8* 25.3* 25.6*  PLT 323 314 348  LABPROT 28.3* 27.0* 27.7*  INR 2.59 2.45 2.53  CREATININE 1.60* 1.61* 1.65*    Estimated Creatinine Clearance: 65.4 mL/min (A) (by C-G formula based on SCr of 1.65 mg/dL (H)).   Assessment: James [name] s/p HVAD 6/28, started on warfarin per MD 6/30. He developed an ileus on 7/5, warfarin was held, and heparin bridge started once INR fell below 1.8.  Heparin stopped 7/10 with INR 2.19. On 7/13, Hgb dropped and he had melena - warfarin held again and pharmacy asked to start heparin once INR < 1.8 pending GI workup.   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   Warfarin resumed 7/18 without heparin bridge. INR  trend up 2.5 at goal Hgb 8 low stable - has received multiple PRBCs. Diet advanced also getting 1 ensure and 1 boost a day  Remains on Amiodarone drip> po> back on drip post iv boluses 7/25 for VT > po increased dose 400mg  TID and increased mexilitine dose.  Goal of Therapy:  INR 2-2.5 Monitor platelets by anticoagulation protocol: Yes   Plan:  1) Warfarin 1 mg tonight 2) Daily INR  Leota SauersLisa Corben Auzenne Pharm.D. CPP, BCPS Clinical Pharmacist [phone] 05/18/2017 11:36 AM

## 2017-05-18 NOTE — Progress Notes (Signed)
Patient ID: James [name], male   DOB: 12-Oct-1961, 56 y.o.   MRN: [id number]   Advanced Heart Failure VAD Team Note  Subjective:    Events: --HVAD placed 6/28.  Returned to the OR that evening with high chest tube output, evacuation of mediastinal hematoma.  --Extubated 6/29. Milrinone stopped on 7/4. --7/4, patient developed ileus with respiratory compromise. NGT placed with 3 L suctioned out.  CXR with suspicion for aspiration PNA.  Patient had to be intubated.  He went into atrial fibrillation with RVR.  He became hypotensive and was started on norepinephrine and phenylephrine.  Amiodarone gtt begun.    --Extubated again on 7/6.  -- 7/13 Had 2 sustained episodes of VT. Had to be cardioverted 1. Second episode broke with overdrive pacing with Dr. Graciela [name]. VAD speed turned down to 2700.  --Melena due to acuteGI bleed --> 2 units PRBCs 7/14, 2 units 7/15. 3 U PRBCs 7/16,  02/01/2017 3 UPRBCs.  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  He had epinephrine, APC, and 2 clips. Intubated prior to procedure and placed on norepinephrine. Speed dropped to 2660.  --7/18 Back in VT with overdrive pacing. Lidocaine was started in addition to amiodarone. Extubated. --7/19 lidocaine stopped with confusion. Neuro consulted.  EEG normal.  CT of head no acute findings. 7/20 confusion had resolved.  --1 unit PRBCs 7/20.  --7/22, developed sustained VT with rate around 130 shortly after getting IV Lasix.  We were able to pace him out and back to NSR.  He was put back on amiodarone gtt.  --More VT on 7/25, paced out.  Back on IV amiodarone gtt.  NSR.    Now on 4E. Switched to po amio 400 tid on 7/27. Tolerating well.  Remains in NSR. Feels ok. Main complaint in left shoulder pain that feels like gout. Denies dyspnea or CP. Weight continues to climb. LE edema getting worse. CVP 9 today. Co-ox 73%. Started on Remeron for depression.    HVAD INTERROGATION:  HVAD:   Flow 4.7 liters/min, speed 2660,  power 4.1 W,  Peak 7.1 Trough 2.5 Suction On. Lavare On. No alarms.    Objective:    Vital Signs:   Temp:  [98 F (36.7 C)-99.3 F (37.4 C)] 98.3 F (36.8 C) (07/29 1200) Pulse Rate:  [34-145] 69 (07/29 1200) Resp:  [18-31] 31 (07/29 1200) BP: (86-118)/(0-92) 86/0 (07/29 0914) SpO2:  [96 %-100 %] 100 % (07/29 1200) Weight:  [109.5 kg (241 lb 4.8 oz)] 109.5 kg (241 lb 4.8 oz) (07/29 0403) Last BM Date: 05/17/17 Mean arterial Pressure 85-90 Intake/Output:   Intake/Output Summary (Last 24 hours) at 05/18/17 1248 Last data filed at 05/18/17 0850  Gross per 24 hour  Intake              391 ml  Output             1200 ml  Net             -809 ml     Physical Exam   CVP 9  Physical Exam: GENERAL: Lying in bed  Fatigued appearing. NAD.  HEENT: Normal.  anicteric NECK: Supple, JVP 9cm Carotids OK.  CARDIAC:  LVAD hum present  LUNGS: Clear anteriorly  ABDOMEN:  NT. ND  Good bowel sounds. No HSM  LVAD exit site: Well-healed and incorporated. Dressing dry and intact. No erythema or drainage. Stabilization device present and accurately applied. Driveline dressing changed daily per sterile  technique. No signs infection  EXTREMITIES:  Warm and dry. No cyanosis, clubbing, or rash. TED Hose present 3+ edema  above TEDs   Chronic extensive gouty changes to bilateral hands. RU PICC. Wound on RLE  Neuro: alert & oriented x 3, cranial nerves grossly intact. moves all 4 extremities w/o difficulty. Affect flat    Telemetry   Personally reviewed, remains in NSR 60s No further VT.   Labs   Basic Metabolic Panel:  Recent Labs Lab 05/14/17 1107 05/15/17 0500 05/16/17 0330 05/17/17 0427 05/18/17 0521  NA 136 135 134* 134* 134*  K 3.7 3.9 3.9 3.9 3.8  CL 112* 110 108 110 109  CO2 19* 20* 20* 18* 19*  GLUCOSE 105* 125* 152* 73 92  BUN 43* 42* 40* 37* 38*  CREATININE 1.55* 1.59* 1.60* 1.61* 1.65*  CALCIUM 7.7* 7.6* 7.6* 7.6* 7.7*  MG 1.8  --   --    --  1.8    Liver Function Tests: No results for input(s): AST, ALT, ALKPHOS, BILITOT, PROT, ALBUMIN in the last 168 hours. No results for input(s): LIPASE, AMYLASE in the last 168 hours. No results for input(s): AMMONIA in the last 168 hours.  CBC:  Recent Labs Lab 05/12/17 0413  05/14/17 1107 05/15/17 0500 05/16/17 0330 05/17/17 0427 05/18/17 0521  WBC 17.8*  < > 7.2 5.8 4.9 4.6 7.1  NEUTROABS 15.6*  --   --   --   --   --   --   HGB 8.7*  < > 8.8* 8.4* 8.2* 8.3* 8.2*  HCT 26.9*  < > 28.2* 26.1* 25.8* 25.3* 25.6*  MCV 89.7  < > 90.7 90.0 89.6 89.7 88.9  PLT 309  < > 346 333 323 314 348  < > = values in this interval not displayed.  INR:  Recent Labs Lab 05/14/17 0528 05/15/17 0500 05/16/17 0330 05/17/17 0427 05/18/17 0521  INR 2.13 2.45 2.59 2.45 2.53    Other results:     Imaging   Dg Chest Port 1 View  Result Date: 05/18/2017 CLINICAL DATA:  Left ventricular assist device. EXAM: PORTABLE CHEST 1 VIEW COMPARISON:  05/11/2017 FINDINGS: Single lead ICD terminates over the right ventricle, unchanged. A left ventricular assist device remains in place. Right PICC terminates over the lower SVC/ cavoatrial junction, unchanged. The cardiac silhouette remains enlarged. Lung volumes remain diminished with central pulmonary vascular congestion, mild vascular indistinctness, and mild perihilar and bibasilar opacity, stable to slightly improved. No sizable pleural effusion or pneumothorax is identified. IMPRESSION: Cardiomegaly and mild edema, stable to slightly improved. Electronically Signed   By: James [name] M.D.   On: 05/18/2017 07:43     Medications:     Scheduled Medications: . amiodarone  400 mg Oral Q8H  . Chlorhexidine Gluconate Cloth  6 each Topical Daily  . febuxostat  120 mg Oral Daily  . feeding supplement  1 Container Oral BID BM  . feeding supplement (ENSURE ENLIVE)  237 mL Oral Q24H  . hydrALAZINE  50 mg Oral Q8H  . insulin aspart  0-15 Units  Subcutaneous TID WC  . isosorbide mononitrate  30 mg Oral Daily  . metoCLOPramide  5 mg Oral TID AC  . mexiletine  300 mg Oral Q8H  . mirtazapine  15 mg Oral QHS  . pantoprazole  40 mg Oral BID  . potassium chloride  40 mEq Oral Once  . predniSONE  40 mg Oral Q breakfast  . sodium chloride flush  10-40 mL Intracatheter  Q12H  . spironolactone  12.5 mg Oral Daily  . torsemide  20 mg Oral Daily  . warfarin  1 mg Oral ONCE-1800  . Warfarin - Pharmacist Dosing Inpatient   Does not apply q1800    Infusions: . sodium chloride Stopped (05/02/17 0630)  . sodium chloride 10 mL/hr at 05/16/17 1800  . ferumoxytol Stopped (05/17/17 0943)  . magnesium sulfate 1 - 4 g bolus IVPB      PRN Medications: albuterol, docusate, hydrALAZINE, neomycin-bacitracin-polymyxin, ondansetron (ZOFRAN) IV, oxyCODONE, phenol, sodium chloride flush, traMADol   Patient Profile   56 yo with CAD s/p CABG, ischemic cardiomyopathy/chronic systolic CHF, tophaceous gout, and CKD stage 3 was admitted for diuresis and consideration for LVAD placement. S/p HVAD on 6/28  Assessment/Plan:    1. Acute/chronic systolic CHF s/p HVAD: Ischemic cardiomyopathy.  [employer] Jude ICD.  Echo (6/18) with EF 15%, mildly dilated RV with moderately decreased systolic function.  s/p HVAD placement 6/28 and had to return to OR to evacuate mediastinal hematoma.  He had been weaned off pressors/milrinone, but developed ileus w/ likely aspiration event 7/4, re-intubated, developed afib/RVR requiring norepinephrine. Extubated 7/6.  VAD speed turned down to 2660 with VT. VAD parameters currently look good.  Milrinone stopped 7/17 early am with recurrent VT.  Now off norepinephrine. Off diuretics as well due to volume-sensitive VT - Volume status continues to worse. CVP 9 Weight up 5-10 pounds from baseline. Will start torsemide carefully at 20mg  daily. Adjust as needed. Remain mindful of RV failure and volume-sensitive VT.  - Continue TED hose  - Off ASA.  Back on warfarin, INR 2.52 -> goal now is 2-2.5.  No further bleeding. Discussed with PharmD - Has been off dig/spironolactone/arb with elevated creatinine.  No on low-dose spiro for gentle fluid removal, HTN and to keep K up. - MAPs stable 85-90 2. AKI on CKD stage 3: Suspect a component of peri-op ATN, worsened with ileus, vomiting, intubation/sedation.   - Stable at 1.65 this am.   3. Symptomatic anemia due to acute UGI bleeding:  Received 3 units PRBCs 7/16 and 3 units PRBCs on 04/27/2017. 1 unit PRBCs 7/20. 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  Requiring 2 clips + epi + APC. Does not appear to be bleeding actively now   - Continue Protonix 40 po bid. - Back on warfarin, aiming for INR 2-2.5 range. Will leave off aspirin for now. Dosing per pharm. - Transfuse hgb < 8. 8.2 this am, fairly stable. No further bleeding. Continue to follow. - Iron stores low. Discussed with PharmD. Got Feraheme 7/28.   4. Ventricular tachycardia: Status post ventricular tachycardia 2 on 7/13. Possible suction event. VAD speed turned down to 2700.  Recurrent VT 7/17. EP called to bedside. Overdrive pacing successful for about 10 minutes but VT recurred.  Milrinone turned off, remained in VT overnight but hemodynamically stable.  Speed decreased to 2660.  On 7/18, we were able to pace him out of VT.  Lidocaine added then stopped due to confusion/mental status changes.  He had VT 7/22 about 30 minutes after getting IV Lasix, had to be paced out again => may have been due to rapid fluid shift.  Pt paced out of VT 3 separate occasions 7/25. Got amio bolus x 2 and started back on IV infusion.   - Off lidocaine with confusion. Now on mexiletine up to 300 mg every 8 hrs and amio 400 TID per EP.  - No VT  overnight. Follow closely with re-institution of diuretics. 5. CAD: s/p CABG 2012. No S/S ischemia.  Off aspirin due to GI bleeding. No change.   6. Gout: Severe tophaceous gout.  - Left  shoulder pain concerning for gout. Will give prednisone 40mg  x 3 days.  - Continue uloric.  7. Atrial fibrillation: Developed atrial fibrillation with RVR in setting of aspiration PNA on 7/4. - Currently NSR.   - Switched to po amio 400 tid on 7/27. No further AF/VT since 05/14/17.   - Keep K> 4.0 Mg > 2.0 8. Malnutrition: Now off TPN. Per Speech- FEEs performed. Continue full liquids. ST to continue to follow.  Encourage po intake, doing better. No change.   9. Aspiration PNA with acute hypoxemic respiratory failure on 7/4: He was extubated on 7/6. Tracheal aspirate with Klebsiella and citrobacter. Both sensitive to Zosyn.  - Completed abx 7/13. No s/s active infection 10. Ileus: Resolved. Getting Reglan. No change. 11. Deconditioning:  Continue PT. Walking unit with assistance. Plan for CIR when stable. Need to make sure he is not having any more VT on po amio prior to sending. EP input appreciated 12. Acute Respiratory Failure: Intubated 7/17 in setting of complex GI intervention. Extubated 7/18. On 4 liters sats stable. No change. 13. Delirium: Resolved. No further. Appreciate neuro input Possibly related to lidocaine.  Lidocaine stopped. Confusion resolved. CT of head negative. EEG ok. No further workup per neuro. No change today.  15. Insomnia: On remeron for sleep and depression. D/w PharmD 16. Post-op depression -Remeron started 7/28. D/w PharmD and Dr. PVT.   Plan for CIR, but need to make sure stable on po amio first. Possible transfer Mon/Tues.   I reviewed the HVAD parameters from today, and compared the results to the patient's prior recorded data.  No programming changes were made.  The HVAD is functioning within specified parameters.    Arvilla MeresBensimhon, Randel Hargens, MD 05/18/2017, 12:48 PM  VAD Team --- VAD ISSUES ONLY--- Pager [phone] (7am - 7am) Advanced Heart Failure Team  Pager [phone] (M-F; 7a - 4p)  Please contact [facility] for night-coverage after hours (4p -7a ) and  weekends on [email or web address]

## 2017-05-18 NOTE — Progress Notes (Signed)
10:30pmCCMD called about patient ringing out VT. Checked on patient. He was a&ox4. No distress. Vital signs stable. 12 lead EKG could not get a good reading but stated wide WRS complex with a rate of 105-109.  Paged VAD team about change in patient heart rhythm. VAD coordinator advised me to call Dr. Shirlee LatchMclean. Dr. Shirlee LatchMclean verbally ordered to give 150mg  IV amio bolus and already scheduled 400mg  amio PO pills to patient. Pt still alert and oriented. Vital signs stable. Will continue to monitor. Bolus infusing.

## 2017-05-19 LAB — COOXEMETRY PANEL
Carboxyhemoglobin: 1.2 % (ref 0.5–1.5)
METHEMOGLOBIN: 1.4 % (ref 0.0–1.5)
O2 Saturation: 67.4 %
Total hemoglobin: 9.4 g/dL — ABNORMAL LOW (ref 12.0–16.0)

## 2017-05-19 LAB — DIFFERENTIAL
BASOS PCT: 0 %
Basophils Absolute: 0 10*3/uL (ref 0.0–0.1)
Eosinophils Absolute: 0 10*3/uL (ref 0.0–0.7)
Eosinophils Relative: 0 %
Lymphocytes Relative: 3 %
Lymphs Abs: 0.2 10*3/uL — ABNORMAL LOW (ref 0.7–4.0)
MONO ABS: 0.2 10*3/uL (ref 0.1–1.0)
MONOS PCT: 2 %
NEUTROS ABS: 8.5 10*3/uL — AB (ref 1.7–7.7)
Neutrophils Relative %: 95 %

## 2017-05-19 LAB — GLUCOSE, CAPILLARY
Glucose-Capillary: 148 mg/dL — ABNORMAL HIGH (ref 65–99)
Glucose-Capillary: 213 mg/dL — ABNORMAL HIGH (ref 65–99)
Glucose-Capillary: 175 mg/dL — ABNORMAL HIGH (ref 65–99)
Glucose-Capillary: 197 mg/dL — ABNORMAL HIGH (ref 65–99)
Glucose-Capillary: 191 mg/dL — ABNORMAL HIGH (ref 65–99)
Glucose-Capillary: 261 mg/dL — ABNORMAL HIGH (ref 65–99)

## 2017-05-19 LAB — BASIC METABOLIC PANEL
ANION GAP: 8 (ref 5–15)
BUN: 39 mg/dL — ABNORMAL HIGH (ref 6–20)
CHLORIDE: 110 mmol/L (ref 101–111)
CO2: 18 mmol/L — AB (ref 22–32)
Calcium: 7.9 mg/dL — ABNORMAL LOW (ref 8.9–10.3)
Creatinine, Ser: 1.9 mg/dL — ABNORMAL HIGH (ref 0.61–1.24)
GFR calc non Af Amer: 38 mL/min — ABNORMAL LOW (ref >60)
GFR, EST AFRICAN AMERICAN: 45 mL/min — AB (ref >60)
Glucose, Bld: 174 mg/dL — ABNORMAL HIGH (ref 65–99)
POTASSIUM: 4.5 mmol/L (ref 3.5–5.1)
Sodium: 136 mmol/L (ref 135–145)

## 2017-05-19 LAB — MAGNESIUM: MAGNESIUM: 2 mg/dL (ref 1.7–2.4)

## 2017-05-19 LAB — LACTATE DEHYDROGENASE: LDH: 199 U/L — ABNORMAL HIGH (ref 98–192)

## 2017-05-19 LAB — PROTIME-INR
INR: 2.25
PROTHROMBIN TIME: 25.2 s — AB (ref 11.4–15.2)

## 2017-05-19 LAB — PREALBUMIN: Prealbumin: <5 mg/dL — ABNORMAL LOW (ref 18–38)

## 2017-05-19 LAB — CBC
HEMATOCRIT: 29 % — AB (ref 39.0–52.0)
HEMOGLOBIN: 9.2 g/dL — AB (ref 13.0–17.0)
MCH: 28.5 pg (ref 26.0–34.0)
MCHC: 31.7 g/dL (ref 30.0–36.0)
MCV: 89.8 fL (ref 78.0–100.0)
Platelets: 387 10*3/uL (ref 150–400)
RBC: 3.23 MIL/uL — AB (ref 4.22–5.81)
RDW: 18.1 % — ABNORMAL HIGH (ref 11.5–15.5)
WBC: 8.9 10*3/uL (ref 4.0–10.5)

## 2017-05-19 MED ORDER — ENSURE ENLIVE PO LIQD
237.0000 mL | Freq: Three times a day (TID) | ORAL | Status: DC
  Administered 2017-05-19 – 2017-05-27 (×11): 237 mL via ORAL

## 2017-05-19 MED ORDER — WARFARIN SODIUM 1 MG PO TABS
1.5000 mg | ORAL_TABLET | Freq: Once | ORAL | Status: DC
  Administered 2017-05-19: 1.5 mg via ORAL
  Filled 2017-05-19: qty 1

## 2017-05-19 NOTE — Progress Notes (Addendum)
Nutrition Consult/Follow Up   DOCUMENTATION CODES:   Severe malnutrition in context of chronic illness  INTERVENTION:    D/C Boost Breeze supplement   Provide Ensure Enlive po TID, each supplement provides 350 kcal and 20 grams of protein  NUTRITION DIAGNOSIS:   Malnutrition (severe) related to chronic illness (CHF) as evidenced by severe depletion of body fat, severe depletion of muscle mass, ongoing  GOAL:   Patient will meet greater than or equal to 90% of their needs, progressing  MONITOR:   PO intake, Supplement acceptance, Labs, Weight trends, Skin, I & O's  ASSESSMENT:   56 yo male with hx of HTN, gout, obesity, chronic edema, CAD, ischemic cardiomyopathy, DM2, AICD, MI, CHF, renal insufficiency who was admitted on 6/22 with acute on chronic systolic heart failure.  6/28  HVAD placement 6/29  Extubated 7/01  Soft diet started, Ensure Enlive added 7/04  Re-intubated 7/05  TPN started 7/06  Extubated 7/11  TPN D/C'd  Pt finishing breakfast upon visit. Nurse Tech at bedside. Reports a good appetite. PO intake good at 80-100% per flowsheets. Shares he does not like [employer]ost Breeze, however, enjoys [employer]nsure Enlive. Medications reviewed and include Reglan & Coumadin. Labs reviewed. PAB <5 (L). CBG's [phone].  Diet Order:  DIET SOFT Room service appropriate? Yes; Fluid consistency: Thin  Skin:  LLE posterior calf >> severe tophaceous gout, with crystals that are exposed  Last BM:  7/29  Height:   Ht Readings from Last 1 Encounters:  04/23/2017 6' (1.829 m)    Weight:   Wt Readings from Last 1 Encounters:  05/19/17 241 lb 8 oz (109.5 kg)    Ideal Body Weight:  80.9 kg  BMI:  Body mass index is 32.75 kg/m.  Estimated Nutritional Needs:   Kcal:  2100-2300  Protein:  120-140 gm  Fluid:  2.1-2.3 L  EDUCATION NEEDS:   No education needs identified at this time  Maureen ChattersKatie Catheline Hixon, RD, LDN Pager #: [phone] After-Hours Pager #: [phone]

## 2017-05-19 NOTE — Progress Notes (Signed)
ANTICOAGULATION CONSULT NOTE - Follow Up Consult  Pharmacy Consult for Warfarin  Indication: HVAD  No Active Allergies  Patient Measurements: Height: 6' (182.9 cm) Weight: 241 lb 8 oz (109.5 kg) IBW/kg (Calculated) : 77.6  Vital Signs: Temp: 98.2 F (36.8 C) (07/30 1230) Temp Source: Oral (07/30 1230) BP: 104/90 (07/30 1200) Pulse Rate: 78 (07/30 1115)  Labs:  Recent Labs  05/17/17 0427 05/18/17 0521 05/19/17 0413  HGB 8.3* 8.2* 9.2*  HCT 25.3* 25.6* 29.0*  PLT 314 348 387  LABPROT 27.0* 27.7* 25.2*  INR 2.45 2.53 2.25  CREATININE 1.61* 1.65* 1.90*    Estimated Creatinine Clearance: 56.8 mL/min (A) (by C-G formula based on SCr of 1.9 mg/dL (H)).   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 He developed an ileus on 7/5, warfarin was held, and heparin bridge started once INR fell below 1.8.  Heparin stopped 7/10 with INR 2.19. On 7/13, Hgb dropped and he had melena - warfarin held again and pharmacy asked to start heparin once INR < 1.8 pending GI workup.   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   Warfarin resumed 7/18 without heparin bridge. INR  trend up 2.25 at goal - have been alternating between 2mg /1mg  QOD  Hgb 8 low stable - has received multiple PRBCs. Diet advanced also getting 1 ensure and 1 boost a day  Remains on Amiodarone drip> po> back on drip post iv boluses 7/25 for VT > po increased dose 400mg  TID and increased mexilitine dose.  Goal of Therapy:  INR 2-2.5 Monitor platelets by anticoagulation protocol: Yes   Plan:  1) Warfarin 1.5 mg daily 2) Daily INR  Leota SauersLisa Blenda Wisecup Pharm.D. CPP, BCPS Clinical Pharmacist [phone] 05/19/2017 2:13 PM

## 2017-05-19 NOTE — Progress Notes (Signed)
Occupational Therapy Treatment Patient Details Name: James [name] MRN: [id number] DOB: 1960/10/22 Today's Date: 05/19/2017    History of present illness  Pt adm with acute on chronic heart failure and underwent Heartware HVAD implant on 6/28. Returned to OR later that day for evacuation of mediastinal hematoma. On 7/4 developed ileus with likely aspiration and intubated 7/4-7/6.   Pt developed Heme + stools with progressive anemia as well as  Kirby FunkVtach 05/02/17.  He underwent EGD with showed suspected AVM 05/20/2017. He was intubated for procedure and extubated 05/08/17 PMH - gout, DMII, HTN, CKD, CAD S/P CABGx6 2012, chronic systolic heart failure and St jude ICD.    OT comments  Pt significantly weaker this date.  He required mod A +2 to move sit to stand using stedy and bed height very elevated. He required min - mod A to maintain static standing, and unable to shift weight to transfer.   He stood x 3 with very long rest breaks in between.  Pt is very fearful of falling and very concerned about new onset weakness - difficult to redirect.Marland Kitchen.  He demonstrates significant motor planning deficits requiring max multimodal cues to execute every step of mobility and sit to stand.  Processing speed is very delayed with impairments in attention noted.  He also is having difficulty feeding self and had difficulty manipulating cell phone to call wife - bil. UEs tremulous.   His vital signs were stable.  Spoke with RN re: current status   Follow Up Recommendations  CIR;Supervision/Assistance - 24 hour    Equipment Recommendations  3 in 1 bedside commode    Recommendations for Other Services Rehab consult    Precautions / Restrictions Precautions Precautions: Sternal Precaution Comments: Heartware HVAD       Mobility Bed Mobility Overal bed mobility: Needs Assistance Bed Mobility: Rolling;Supine to Sit;Sit to Supine Rolling: Max assist   Supine to sit: Max assist Sit to supine: Max assist;+2 for physical  assistance Sit to sidelying: Max assist General bed mobility comments: Pt requires step by step instruction for all aspects of bed mobility.  He was unable to motor plan how to move himself into or out of the bed, and often moved against direction he was instructed to move   Transfers Overall transfer level: Needs assistance Equipment used: Ambulation equipment used Transfers: Sit to/from Stand Sit to Stand: Mod assist;+2 physical assistance         General transfer comment: With Bed height elevated, pt required mod A +2 to stand x 1, and mod A +1 to stand other two attempts.  on third attempt, pt unable to extend trunk or hips     Balance Overall balance assessment: Needs assistance Sitting-balance support: Feet supported;Bilateral upper extremity supported Sitting balance-Leahy Scale: Poor Sitting balance - Comments: Pt required close min guard to min A to maintain EOB sitting.  Intermittent Lt lateral lean noted  Postural control: Left lateral lean Standing balance support: Bilateral upper extremity supported;During functional activity Standing balance-Leahy Scale: Poor Standing balance comment: Pt required min - max a to maintain static standing with bil. UE support using the stedy                            ADL either performed or assessed with clinical judgement   ADL Overall ADL's : Needs assistance/impaired Eating/Feeding: Maximal assistance;Bed level Eating/Feeding Details (indicate cue type and reason): Checked on pt eating lunch.  Mother in law feeding  pt.  He demonstrates signficant difficulty manipulating utensils                      Toilet Transfer: Total assistance Toilet Transfer Details (indicate cue type and reason): unable to safely attempt this date                  Vision       Perception     Praxis      Cognition Arousal/Alertness: Awake/alert Behavior During Therapy: Anxious Overall Cognitive Status: Impaired/Different  from baseline Area of Impairment: Attention;Memory;Following commands;Safety/judgement;Problem solving                   Current Attention Level: Selective;Sustained Memory: Decreased recall of precautions Following Commands: Follows one step commands consistently;Follows one step commands with increased time Safety/Judgement: Decreased awareness of safety   Problem Solving: Slow processing;Decreased initiation;Difficulty sequencing;Requires verbal cues;Requires tactile cues General Comments: Pt very fixated knee buckling and new onset LE weakness.  During activity, pt unable to problem solve how to move in and out of bed, how to shift weight to prevent LOB.  While sitting EOB, he failed to initiate lifting LEs off the stedy and it rolled with LEs still on it, and pt unable to iniate a response, or reaction to problem at hand.  required step by step cues and assist for all aspects of mobility.  Difficult to redirect at times         Exercises     Shoulder Instructions       General Comments VSS.  Pt with difficulty dialing and manipulating cell phone.  Bil. UEs very tremulous     Pertinent Vitals/ Pain       Pain Assessment: No/denies pain  Home Living                                          Prior Functioning/Environment              Frequency  Min 3X/week        Progress Toward Goals  OT Goals(current goals can now be found in the care plan section)  Progress towards OT goals: Not progressing toward goals - comment     Plan Discharge plan remains appropriate    Co-evaluation                 AM-PAC PT "6 Clicks" Daily Activity     Outcome Measure   Help from another person eating meals?: A Lot Help from another person taking care of personal grooming?: A Lot Help from another person toileting, which includes using toliet, bedpan, or urinal?: Total Help from another person bathing (including washing, rinsing, drying)?: A  Lot Help from another person to put on and taking off regular upper body clothing?: A Lot Help from another person to put on and taking off regular lower body clothing?: Total 6 Click Score: 10    End of Session    OT Visit Diagnosis: Muscle weakness (generalized) (M62.81)   Activity Tolerance Patient limited by fatigue   Patient Left in bed;with call bell/phone within reach   Nurse Communication Mobility status        Time: [id number] OT Time Calculation (min): 92 min  Charges: OT General Charges $OT Visit: 1 Procedure OT Treatments $Therapeutic Activity: 83-97 mins  Kashon Kraynak, OTR/L [phone]    Shanika Levings,  Ursula Alert M 05/19/2017, 8:00 PM

## 2017-05-19 NOTE — Progress Notes (Signed)
LVAD Inpatient Coordinator Rounding Note:  Admitted 04/16/2017 due to A/C Heart failure.   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 Laneta SimmersBartle as DT VAD.  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 He had epinephrine, APC, and 2 clips.   Vital signs: HR: 56  Doppler:  94 Auto BP:  102/85 (93) O2 Sat: 100% on RA Wt:207>209 > 213 >213 > 208>203>220>227lbs...........161>096>045>409>811>914>782>956>213234>233>240>240>236>239>238>240>241  LVAD interrogation reveals:  Speed: 2660 Flow: 4.4 Power: 4.0w Alarms:none Peak: 7.5 Trough: 3 HCT: 29 Low flow alarm setting: 2.5 High watt alarm setting: 7.5   Suction: on  Lavare cycle: on  Blood Products: 6/28> 5 PRBC's, 6 FFP 7/1> 1 PRBC 7/9> 1 PRBC 7/13>3 PRBC 7/14>3 PRBC 7/15> 1 PRBC 7/16>3PRBC 7/17> 3 PRBC 7/20> 1 PRBC  Gtts: Milrinone restarted 7/8, stopped 7/16 Levo stopped 04/26/17 restarted 04/29/2017 - stopped 05/07/17 Amiodarone stopped 04/29/17, restarted 7/17 - transitioned to PO 05/13/17; VT on 05/15/17 - IV amio re-started - 30 mg/hr stopped 05/16/17 Heparin stopped 04/29/17 Lidocaine 05/07/17 - stopped 7/19  TPN - started 04/24/17 for nutritional support, stopped 04/30/17  Arrhythmia: 04/24/17 - Afib with RVR - started amiodarone 05/02/17 - 2 sustained episodes of VT - cardioverted x 1; second broke with overdrive pacing 7/17- wide complex tachycardia- Amio bolus x3 and gtt at 60 mg/hr 05/07/17 - sustained VT - converted with overdrive pacing; Lidocaine started in addition to amiodarone 05/08/17 - Lidocaine stopped due to confusion (lido level 12.4) 05/14/17 - sustained VT - converted 3 separate occassions with overdrive pacing 05/19/17 - sustained VT-converted with overdrive pacing   Respiratory: 04/23/17 - re-intubated due to respiratory failure secondary to suspected aspiration pneumonia 04/25/17-extubated 05/02/2017- intubated for EGD 05/07/2017- extubated  Drive Line: Daily Dressing Kits with  Aquacell AG silver strips per protocol.   Labs:  LDH trend:160 (pre VAD)>227>215......200>183>161>164>184>195>190>188>187>205>212>185>167>167>199  INR trend: 1.31>1.45>1.44>2.09>2.28>3.58>3.94>3.22>2.17>1.74>1.7>2.24>2.68>2.33>2.15>2.25>2.11>2.13>2.45>2.25  Anticoagulation Plan: -INR Goal: 2-2.5  -ASA Dose: 325 mg- on hold  Adverse Events on VAD: - [facility] - 04/23/17 Ileus with vomiting; re-intubation for acute respiratory failure; probable aspiration pneumonia - 7/13-GI bleed - 7/19 - Lidocaine gtt stopped due to confusion (lido toxicity)    Plan/Recommendations:  1. Continue daily drive line dressing changes with patient's wife.  2. Please call VAD pager with patient and equipment concerns.. 3. [facility] if possible maybe Tuesday or Wednesday.  4. RAMP echo tomorrow at 1300.  Carlton AdamSarah Carma Dwiggins RN, VAD Coordinator 24/7 pager [phone]

## 2017-05-19 NOTE — Significant Event (Signed)
Rapid Response Event Note  Overview:Called by bedside RN d/t monitor ringing off vtach.   Time Called: 2215 Arrival Time: 2220 Event Type: Cardiac  Initial Focused Assessment: Pt laying in bed, alert and oriented, c/o no dizziness, chest pain, SOB.  HR-106 in a wide complex rhythm.  BP-116/91, VAD-WNL.  12 lead EKG obtained with inability to get a good signal in some leads.  EKG result was "wide complex rhythm."     Interventions: VAD coordinator and Dr. Shirlee LatchMcLean notified.  Orders for 150mg  Amiodarone bolus X 1 IV and 400mg  scheduled po amiodarone.  Plan of Care (if not transferred): Monitor patient.  Alert MD if HR/rhythm unchanged after bolus. Call RRT if further assistance needed.  Event Summary: Name of Physician Notified: Dr. Shirlee LatchMclean and Kirt BoysMolly, VAD coordinator at 2250    at    Outcome: Stayed in room and stabalized  Event End Time: 2315  Terrilyn SaverHopper, [place]

## 2017-05-19 NOTE — Progress Notes (Signed)
CSW attempted visit at bedside although patient sound asleep and did not want to wake him. Patient was transferred to 4E over the weekend. CSW will attempt visit again tomorrow. Lasandra BeechJackie Augustine Leverette, LCSW, CCSW-MCS [phone]

## 2017-05-19 NOTE — Care Management Important Message (Signed)
Important Message  Patient Details  Name: James [name] MRN: [id number] Date of Birth: 1961/07/16   Medicare Important Message Given:  No Due to illness patient is not able to sign unsigned copy left at bedside   Dorena BodoIris Hersel Mcmeen 05/19/2017, 1:59 PM

## 2017-05-19 NOTE — Progress Notes (Addendum)
Patient ID: James FabianHarry D [name], male   DOB: 1961-05-15, 56 y.o.   MRN: [id number]   Advanced Heart Failure VAD Team Note  Subjective:    Events: --HVAD placed 6/28.  Returned to the OR that evening with high chest tube output, evacuation of mediastinal hematoma.  --Extubated 6/29. Milrinone stopped on 7/4. --7/4, patient developed ileus with respiratory compromise. NGT placed with 3 L suctioned out.  CXR with suspicion for aspiration PNA.  Patient had to be intubated.  He went into atrial fibrillation with RVR.  He became hypotensive and was started on norepinephrine and phenylephrine.  Amiodarone gtt begun.    --Extubated again on 7/6.  -- 7/13 Had 2 sustained episodes of VT. Had to be cardioverted 1. Second episode broke with overdrive pacing with Dr. Graciela HusbandsKlein. VAD speed turned down to 2700.  --Melena due to acuteGI bleed --> 2 units PRBCs 7/14, 2 units 7/15. 3 U PRBCs 7/16,  04/30/2017 3 UPRBCs.  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  He had epinephrine, APC, and 2 clips. Intubated prior to procedure and placed on norepinephrine. Speed dropped to 2660.  --7/18 Back in VT with overdrive pacing. Lidocaine was started in addition to amiodarone. Extubated. --7/19 lidocaine stopped with confusion. Neuro consulted.  EEG normal.  CT of head no acute findings. 7/20 confusion had resolved.  --1 unit PRBCs 7/20.  --7/22, developed sustained VT with rate around 130 shortly after getting IV Lasix.  We were able to pace him out and back to NSR.  He was put back on amiodarone gtt.  --More VT on 7/25, paced out.  Back on IV amiodarone gtt.  NSR.   Now on 4E.  He had a run of VT last night that resolved with amiodarone bolus.  This morning, he is in a fairly regular rhythm with rate in upper 90s-100, ?atrial fibrillation.  No complaints, no dyspnea.  Did not get PT yesterday.  Still very weak and requiring a lot of assistance.  CVP 8 this morning. Started on  torsemide yesterday with good response, weight not accurate this morning (done in bed).  Co-ox 67%.  Hemoglobin higher at 9.2.    HVAD INTERROGATION:  HVAD:  Flow 5 liters/min, speed 2660,  power 4.3 W,  Peak 7 Trough 3.5.  Suction On. Lavare On. No alarms.    Objective:    Vital Signs:   Temp:  [98.1 F (36.7 C)-99.1 F (37.3 C)] 98.2 F (36.8 C) (07/30 0500) Pulse Rate:  [69-105] 98 (07/30 0800) Resp:  [25-32] 26 (07/30 0800) BP: (84-114)/(0-94) 95/78 (07/30 0800) SpO2:  [97 %-100 %] 100 % (07/30 0800) Weight:  [251 lb 1.6 oz (113.9 kg)] 251 lb 1.6 oz (113.9 kg) (07/30 0500) Last BM Date: 05/17/17 Mean arterial Pressure 85-90 Intake/Output:   Intake/Output Summary (Last 24 hours) at 05/19/17 0854 Last data filed at 05/19/17 0519  Gross per 24 hour  Intake              360 ml  Output              825 ml  Net             -465 ml     Physical Exam   CVP 8  Physical Exam: GENERAL: Lying in bed  Fatigued appearing. NAD.  HEENT: Normal.  anicteric.  NECK: Supple, JVP 8-9 cm   CARDIAC:  LVAD hum present  LUNGS: Clear anteriorly  ABDOMEN:  NT.  ND.  Good bowel sounds. No HSM  LVAD exit site: Well-healed and incorporated. Dressing dry and intact. No erythema or drainage. Stabilization device present and accurately applied. Driveline dressing changed daily per sterile technique. No signs infection  EXTREMITIES:  Warm and dry. No cyanosis, clubbing, or rash. 1+ edema to knees bilaterally.  Chronic extensive gouty changes to bilateral hands. RU PICC. Wound on RLE  Neuro: alert & oriented x 3, cranial nerves grossly intact. moves all 4 extremities w/o difficulty. Affect flat   Telemetry   Personally reviewed.  Episode of VT overnight.  Now in fairly regular rhythm in upper 90s-100.  ?atrial fibrillation.   Labs   Basic Metabolic Panel:  Recent Labs Lab 05/14/17 1107 05/15/17 0500 05/16/17 0330 05/17/17 0427 05/18/17 0521 05/19/17 0413  NA 136 135 134* 134* 134* 136  K  3.7 3.9 3.9 3.9 3.8 4.5  CL 112* 110 108 110 109 110  CO2 19* 20* 20* 18* 19* 18*  GLUCOSE 105* 125* 152* 73 92 174*  BUN 43* 42* 40* 37* 38* 39*  CREATININE 1.55* 1.59* 1.60* 1.61* 1.65* 1.90*  CALCIUM 7.7* 7.6* 7.6* 7.6* 7.7* 7.9*  MG 1.8  --   --   --  1.8 2.0    Liver Function Tests: No results for input(s): AST, ALT, ALKPHOS, BILITOT, PROT, ALBUMIN in the last 168 hours. No results for input(s): LIPASE, AMYLASE in the last 168 hours. No results for input(s): AMMONIA in the last 168 hours.  CBC:  Recent Labs Lab 05/15/17 0500 05/16/17 0330 05/17/17 0427 05/18/17 0521 05/19/17 0413  WBC 5.8 4.9 4.6 7.1 8.9  NEUTROABS  --   --   --   --  8.5*  HGB 8.4* 8.2* 8.3* 8.2* 9.2*  HCT 26.1* 25.8* 25.3* 25.6* 29.0*  MCV 90.0 89.6 89.7 88.9 89.8  PLT 333 323 314 348 387    INR:  Recent Labs Lab 05/15/17 0500 05/16/17 0330 05/17/17 0427 05/18/17 0521 05/19/17 0413  INR 2.45 2.59 2.45 2.53 2.25    Other results:     Imaging   Dg Chest Port 1 View  Result Date: 05/18/2017 CLINICAL DATA:  Left ventricular assist device. EXAM: PORTABLE CHEST 1 VIEW COMPARISON:  05/11/2017 FINDINGS: Single lead ICD terminates over the right ventricle, unchanged. A left ventricular assist device remains in place. Right PICC terminates over the lower SVC/ cavoatrial junction, unchanged. The cardiac silhouette remains enlarged. Lung volumes remain diminished with central pulmonary vascular congestion, mild vascular indistinctness, and mild perihilar and bibasilar opacity, stable to slightly improved. No sizable pleural effusion or pneumothorax is identified. IMPRESSION: Cardiomegaly and mild edema, stable to slightly improved. Electronically Signed   By: Sebastian Ache M.D.   On: 05/18/2017 07:43     Medications:     Scheduled Medications: . amiodarone  400 mg Oral Q8H  . Chlorhexidine Gluconate Cloth  6 each Topical Daily  . febuxostat  120 mg Oral Daily  . feeding supplement  1  Container Oral BID BM  . feeding supplement (ENSURE ENLIVE)  237 mL Oral Q24H  . hydrALAZINE  50 mg Oral Q8H  . insulin aspart  0-15 Units Subcutaneous TID WC  . isosorbide mononitrate  30 mg Oral Daily  . metoCLOPramide  5 mg Oral TID AC  . mexiletine  300 mg Oral Q8H  . mirtazapine  15 mg Oral QHS  . pantoprazole  40 mg Oral BID  . predniSONE  40 mg Oral Q breakfast  . sodium chloride flush  10-40 mL Intracatheter Q12H  . spironolactone  12.5 mg Oral Daily  . torsemide  20 mg Oral Daily  . Warfarin - Pharmacist Dosing Inpatient   Does not apply q1800    Infusions: . sodium chloride Stopped (05/02/17 0630)  . sodium chloride 10 mL/hr at 05/16/17 1800  . ferumoxytol Stopped (05/17/17 0943)    PRN Medications: albuterol, docusate, hydrALAZINE, neomycin-bacitracin-polymyxin, ondansetron (ZOFRAN) IV, oxyCODONE, phenol, sodium chloride flush, traMADol   Patient Profile   56 yo with CAD s/p CABG, ischemic cardiomyopathy/chronic systolic CHF, tophaceous gout, and CKD stage 3 was admitted for diuresis and consideration for LVAD placement. S/p HVAD on 6/28  Assessment/Plan:    1. Acute/chronic systolic CHF s/p HVAD: Ischemic cardiomyopathy.  St Jude ICD.  Echo (6/18) with EF 15%, mildly dilated RV with moderately decreased systolic function.  s/p HVAD placement 6/28 and had to return to OR to evacuate mediastinal hematoma.  He had been weaned off pressors/milrinone, but developed ileus w/ likely aspiration event 7/4, re-intubated, developed afib/RVR requiring norepinephrine. Extubated 7/6.  VAD speed turned down to 2660 with VT. VAD parameters currently look good.  Milrinone stopped 7/17 early am with recurrent VT.  Now off norepinephrine.  CVP 8 today with co-ox 67%. Torsemide 20 daily with spironolactone 12.5 daily was started on 7/29.  - Continue current torsemide for gentle diuresis. Remain mindful of RV failure and volume-sensitive VT.  - Continue TED hose  - Off ASA. Back on  warfarin, INR 2.25 -> goal now is 2-2.5.  No further bleeding.  - Has been off dig/arb with elevated creatinine.  No on low-dose spiro for gentle fluid removal, HTN and to keep K up. - MAPs stable 80s.  2. AKI on CKD stage 3: Suspect a component of peri-op ATN, worsened with ileus, vomiting, intubation/sedation.  Mildly higher at 1.9 today but BUN stable.    3. Symptomatic anemia due to acute UGI bleeding:  Received 3 units PRBCs 7/16 and 3 units PRBCs on 05/14/2017. 1 unit PRBCs 7/20. 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  Requiring 2 clips + epi + APC. Does not appear to be bleeding actively now, hgb higher today.  - Continue Protonix 40 po bid. - Back on warfarin, aiming for INR 2-2.5 range. Will leave off aspirin for now. Dosing per pharm. - Transfuse hgb < 8. 9.2 this am, higher. No further bleeding. Continue to follow. - Got Feraheme 7/28.   4. Ventricular tachycardia: Status post ventricular tachycardia 2 on 7/13. Possible suction event. VAD speed turned down to 2700.  Recurrent VT 7/17. EP called to bedside. Overdrive pacing successful for about 10 minutes but VT recurred.  Milrinone turned off, remained in VT overnight but hemodynamically stable.  Speed decreased to 2660.  On 7/18, we were able to pace him out of VT.  Lidocaine added then stopped due to confusion/mental status changes.  He had VT 7/22 about 30 minutes after getting IV Lasix, had to be paced out again => may have been due to rapid fluid shift.  Pt paced out of VT 3 separate occasions 7/25. Got amio bolus x 2 and started back on IV infusion.  Transitioned to amiodarone 400 mg tid.  Had run of VT last night, resolved with bolus of amiodarone.  - Off lidocaine with confusion. Now on mexiletine up to 300 mg every 8 hrs and amio 400 TID per EP.  5. CAD: s/p CABG 2012. No S/S ischemia.  Off aspirin due  to GI bleeding. No change.   6. Gout: Severe tophaceous gout. Left shoulder pain ?gout.   - On  prednisone 40 mg x 3 days => better after 1st dose.  - Continue uloric.  7. Atrial fibrillation: Developed atrial fibrillation with RVR in setting of aspiration PNA on 7/4. Today, he is in a fairly regular but faster rhythm with rate upper 90s-100.  Do not think VT, ?atrial fibrillation.  - Continue amiodarone.  - Will ask St Jude to interrogate device to see what rhythm is.  - ECG now.  8. Malnutrition: Now off TPN. Per Speech- FEEs performed. Prealbumin very low. Will have nutrition see again.  9. Aspiration PNA with acute hypoxemic respiratory failure on 7/4: He was extubated on 7/6. Tracheal aspirate with Klebsiella and citrobacter. Both sensitive to Zosyn. Completed abx 7/13. No s/s active infection 10. Ileus: Resolved. Getting Reglan. No change. 11. Deconditioning:  Plan for CIR when stable. Possibly mid-week.  Needs PT daily.  12. Acute Respiratory Failure: Intubated 7/17 in setting of complex GI intervention. Extubated 7/18. On 4 liters sats stable. No change. 13. Delirium: Resolved. No further. Appreciate neuro input Possibly related to lidocaine.  Lidocaine stopped. Confusion resolved. CT of head negative. EEG ok. No further workup per neuro. No change today.  15. Insomnia: On remeron for sleep and depression. 16. Post-op depression: Remeron started 7/28.   Plan for CIR, but need to make sure stable on po amio first. 1 episode of VT last night.  Possible transfer Tuesday/Wednesday.   I reviewed the HVAD parameters from today, and compared the results to the patient's prior recorded data.  No programming changes were made.  The HVAD is functioning within specified parameters.    Marca Anconaalton Chord Takahashi, MD 05/19/2017, 8:54 AM  VAD Team --- VAD ISSUES ONLY--- Pager [phone] (7am - 7am) Advanced Heart Failure Team  Pager [phone] (M-F; 7a - 4p)  Please contact [facility] for night-coverage after hours (4p -7a ) and weekends on [email or web address]  Reviewed rhythm with St Jude rep, slow VT.  He was  easily paced out to NSR in 70s.    Will plan ramp echo tomorrow.   Marca AnconaDalton Jamear Carbonneau 05/19/2017 9:18 AM

## 2017-05-20 ENCOUNTER — Inpatient Hospital Stay (HOSPITAL_COMMUNITY): Payer: Medicare PPO

## 2017-05-20 DIAGNOSIS — I361 Nonrheumatic tricuspid (valve) insufficiency: Secondary | ICD-10-CM

## 2017-05-20 LAB — CBC
HEMATOCRIT: 26.4 % — AB (ref 39.0–52.0)
HEMOGLOBIN: 8.5 g/dL — AB (ref 13.0–17.0)
MCH: 28.7 pg (ref 26.0–34.0)
MCHC: 32.2 g/dL (ref 30.0–36.0)
MCV: 89.2 fL (ref 78.0–100.0)
Platelets: 383 10*3/uL (ref 150–400)
RBC: 2.96 MIL/uL — AB (ref 4.22–5.81)
RDW: 18.1 % — ABNORMAL HIGH (ref 11.5–15.5)
WBC: 6.2 10*3/uL (ref 4.0–10.5)

## 2017-05-20 LAB — COOXEMETRY PANEL
Carboxyhemoglobin: 1.5 % (ref 0.5–1.5)
METHEMOGLOBIN: 0.7 % (ref 0.0–1.5)
O2 Saturation: 68.3 %
Total hemoglobin: 15.7 g/dL (ref 12.0–16.0)

## 2017-05-20 LAB — BASIC METABOLIC PANEL
Anion gap: 8 (ref 5–15)
BUN: 47 mg/dL — ABNORMAL HIGH (ref 6–20)
CHLORIDE: 108 mmol/L (ref 101–111)
CO2: 18 mmol/L — AB (ref 22–32)
CREATININE: 2.04 mg/dL — AB (ref 0.61–1.24)
Calcium: 7.9 mg/dL — ABNORMAL LOW (ref 8.9–10.3)
GFR calc non Af Amer: 35 mL/min — ABNORMAL LOW (ref >60)
GFR, EST AFRICAN AMERICAN: 41 mL/min — AB (ref >60)
Glucose, Bld: 238 mg/dL — ABNORMAL HIGH (ref 65–99)
POTASSIUM: 4.2 mmol/L (ref 3.5–5.1)
Sodium: 134 mmol/L — ABNORMAL LOW (ref 135–145)

## 2017-05-20 LAB — ECHOCARDIOGRAM LIMITED
HEIGHTINCHES: 72 in
Weight: 3984 oz

## 2017-05-20 LAB — GLUCOSE, CAPILLARY
GLUCOSE-CAPILLARY: 251 mg/dL — AB (ref 65–99)
Glucose-Capillary: 221 mg/dL — ABNORMAL HIGH (ref 65–99)
Glucose-Capillary: 229 mg/dL — ABNORMAL HIGH (ref 65–99)
Glucose-Capillary: 243 mg/dL — ABNORMAL HIGH (ref 65–99)

## 2017-05-20 LAB — LACTATE DEHYDROGENASE: LDH: 165 U/L (ref 98–192)

## 2017-05-20 LAB — PROTIME-INR
INR: 2.64
PROTHROMBIN TIME: 28.7 s — AB (ref 11.4–15.2)

## 2017-05-20 MED ORDER — SPIRONOLACTONE 25 MG PO TABS
25.0000 mg | ORAL_TABLET | Freq: Every day | ORAL | Status: DC
  Administered 2017-05-21 – 2017-05-28 (×8): 25 mg via ORAL
  Filled 2017-05-20 (×8): qty 1

## 2017-05-20 MED ORDER — TORSEMIDE 20 MG PO TABS
40.0000 mg | ORAL_TABLET | Freq: Every day | ORAL | Status: DC
  Administered 2017-05-20 – 2017-05-21 (×2): 40 mg via ORAL
  Filled 2017-05-20 (×2): qty 2

## 2017-05-20 MED ORDER — SPIRONOLACTONE 25 MG PO TABS
12.5000 mg | ORAL_TABLET | Freq: Once | ORAL | Status: AC
  Administered 2017-05-20: 12.5 mg via ORAL
  Filled 2017-05-20: qty 1

## 2017-05-20 MED ORDER — HYDRALAZINE HCL 50 MG PO TABS
75.0000 mg | ORAL_TABLET | Freq: Three times a day (TID) | ORAL | Status: DC
  Administered 2017-05-20 – 2017-05-21 (×3): 75 mg via ORAL
  Filled 2017-05-20 (×3): qty 1

## 2017-05-20 MED ORDER — FUROSEMIDE 10 MG/ML IJ SOLN
40.0000 mg | Freq: Once | INTRAMUSCULAR | Status: DC
  Filled 2017-05-20: qty 4

## 2017-05-20 MED ORDER — WARFARIN 0.5 MG HALF TABLET
0.5000 mg | ORAL_TABLET | Freq: Once | ORAL | Status: AC
  Administered 2017-05-20: 0.5 mg via ORAL
  Filled 2017-05-20: qty 1

## 2017-05-20 NOTE — Progress Notes (Signed)
Patient ID: James [name], male   DOB: 22-Sep-1961, 56 y.o.   MRN: [id number]   Advanced Heart Failure VAD Team Note  Subjective:    Events: --HVAD placed 6/28.  Returned to the OR that evening with high chest tube output, evacuation of mediastinal hematoma.  --Extubated 6/29. Milrinone stopped on 7/4. --7/4, patient developed ileus with respiratory compromise. NGT placed with 3 L suctioned out.  CXR with suspicion for aspiration PNA.  Patient had to be intubated.  He went into atrial fibrillation with RVR.  He became hypotensive and was started on norepinephrine and phenylephrine.  Amiodarone gtt begun.    --Extubated again on 7/6.  -- 7/13 Had 2 sustained episodes of VT. Had to be cardioverted 1. Second episode broke with overdrive pacing with Dr. Graciela Husbands. VAD speed turned down to 2700.  --Melena due to acuteGI bleed --> 2 units PRBCs 7/14, 2 units 7/15. 3 U PRBCs 7/16,  05/14/2017 3 UPRBCs.  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  He had epinephrine, APC, and 2 clips. Intubated prior to procedure and placed on norepinephrine. Speed dropped to 2660.  --7/18 Back in VT with overdrive pacing. Lidocaine was started in addition to amiodarone. Extubated. --7/19 lidocaine stopped with confusion. Neuro consulted.  EEG normal.  CT of head no acute findings. 7/20 confusion had resolved.  --1 unit PRBCs 7/20.  --7/22, developed sustained VT with rate around 130 shortly after getting IV Lasix.  We were able to pace him out and back to NSR.  He was put back on amiodarone gtt.  --More VT on 7/25, paced out.  Back on IV amiodarone gtt.  NSR.   Yesterday had slow VT so he was given amio bolus. No VT over night. Remains severely deconditioned. CVP up 15. Weight trending up. Hgb stable.   Denies SOB. Complains of fatigue.    HVAD INTERROGATION:  HVAD:  Flow 4.1liters/min, speed 2660,  power 4 W,  Peak 7.5 Trough 2.8.  Suction On. Lavare On. No alarms.     Objective:    Vital Signs:   Temp:  [97.4 F (36.3 C)-98.5 F (36.9 C)] 97.4 F (36.3 C) (07/31 0855) Pulse Rate:  [69-122] 69 (07/31 0855) Resp:  [18-32] 20 (07/31 0855) BP: (100-125)/(83-97) 103/85 (07/31 0855) SpO2:  [97 %-100 %] 99 % (07/31 0855) Weight:  [243 lb 9.6 oz (110.5 kg)-249 lb (112.9 kg)] 249 lb (112.9 kg) (07/31 0600) Last BM Date: 05/19/17 Mean arterial Pressure 90-100s  Intake/Output:   Intake/Output Summary (Last 24 hours) at 05/20/17 0940 Last data filed at 05/19/17 1425  Gross per 24 hour  Intake              100 ml  Output              750 ml  Net             -650 ml     Physical Exam   CVp 15 Physical Exam: GENERAL: Chronically ill  appearing, in bed.  HEENT: normal  NECK: Supple, JVP to jaw .  2+ bilaterally, no bruits.  No lymphadenopathy or thyromegaly appreciated.   CARDIAC:  Mechanical heart sounds with LVAD hum present.  LUNGS:  Clear to auscultation bilaterally.  ABDOMEN:  Soft, round, nontender, positive bowel sounds x4.     LVAD exit site: well-healed and incorporated.  Dressing dry and intact.  No erythema or drainage.  Stabilization device present and accurately applied.  Driveline  dressing is being changed daily per sterile technique. EXTREMITIES:  Warm and dry, no cyanosis, clubbing, rash. R and LLE 2+ edema. Gouty nodules on hands.   NEUROLOGIC:  Alert and oriented x 4.  Gait steady.  No aphasia.  No dysarthria.  Affect pleasant.       Telemetry   Personally reviewed. No VT over night. NSR 60s    Labs   Basic Metabolic Panel:  Recent Labs Lab 05/14/17 1107  05/16/17 0330 05/17/17 0427 05/18/17 0521 05/19/17 0413 05/20/17 0459  NA 136  < > 134* 134* 134* 136 134*  K 3.7  < > 3.9 3.9 3.8 4.5 4.2  CL 112*  < > 108 110 109 110 108  CO2 19*  < > 20* 18* 19* 18* 18*  GLUCOSE 105*  < > 152* 73 92 174* 238*  BUN 43*  < > 40* 37* 38* 39* 47*  CREATININE 1.55*  < > 1.60* 1.61* 1.65* 1.90* 2.04*  CALCIUM 7.7*  < > 7.6* 7.6*  7.7* 7.9* 7.9*  MG 1.8  --   --   --  1.8 2.0  --   < > = values in this interval not displayed.  Liver Function Tests: No results for input(s): AST, ALT, ALKPHOS, BILITOT, PROT, ALBUMIN in the last 168 hours. No results for input(s): LIPASE, AMYLASE in the last 168 hours. No results for input(s): AMMONIA in the last 168 hours.  CBC:  Recent Labs Lab 05/16/17 0330 05/17/17 0427 05/18/17 0521 05/19/17 0413 05/20/17 0459  WBC 4.9 4.6 7.1 8.9 6.2  NEUTROABS  --   --   --  8.5*  --   HGB 8.2* 8.3* 8.2* 9.2* 8.5*  HCT 25.8* 25.3* 25.6* 29.0* 26.4*  MCV 89.6 89.7 88.9 89.8 89.2  PLT 323 314 348 387 383    INR:  Recent Labs Lab 05/16/17 0330 05/17/17 0427 05/18/17 0521 05/19/17 0413 05/20/17 0459  INR 2.59 2.45 2.53 2.25 2.64    Other results:     Imaging   No results found.   Medications:     Scheduled Medications: . amiodarone  400 mg Oral Q8H  . Chlorhexidine Gluconate Cloth  6 each Topical Daily  . febuxostat  120 mg Oral Daily  . feeding supplement (ENSURE ENLIVE)  237 mL Oral TID BM  . hydrALAZINE  50 mg Oral Q8H  . insulin aspart  0-15 Units Subcutaneous TID WC  . isosorbide mononitrate  30 mg Oral Daily  . metoCLOPramide  5 mg Oral TID AC  . mexiletine  300 mg Oral Q8H  . mirtazapine  15 mg Oral QHS  . pantoprazole  40 mg Oral BID  . predniSONE  40 mg Oral Q breakfast  . sodium chloride flush  10-40 mL Intracatheter Q12H  . spironolactone  12.5 mg Oral Daily  . torsemide  20 mg Oral Daily  . warfarin  0.5 mg Oral ONCE-1800  . Warfarin - Pharmacist Dosing Inpatient   Does not apply q1800    Infusions: . sodium chloride Stopped (05/02/17 0630)  . sodium chloride 10 mL/hr at 05/16/17 1800  . ferumoxytol Stopped (05/17/17 0943)    PRN Medications: albuterol, docusate, hydrALAZINE, neomycin-bacitracin-polymyxin, ondansetron (ZOFRAN) IV, oxyCODONE, phenol, sodium chloride flush, traMADol   Patient Profile   56 yo with CAD s/p CABG,  ischemic cardiomyopathy/chronic systolic CHF, tophaceous gout, and CKD stage 3 was admitted for diuresis and consideration for LVAD placement. S/p HVAD on 6/28  Assessment/Plan:    1. Acute/chronic  systolic CHF s/p HVAD: Ischemic cardiomyopathy.  [employer] ICD.  Echo (6/18) with EF 15%, mildly dilated RV with moderately decreased systolic function.  s/p HVAD placement 6/28 and had to return to OR to evacuate mediastinal hematoma.  He had been weaned off pressors/milrinone, but developed ileus w/ likely aspiration event 7/4, re-intubated, developed afib/RVR requiring norepinephrine. Extubated 7/6.  VAD speed turned down to 2660 with VT. VAD parameters currently look good.  Milrinone stopped 7/17 early am with recurrent VT.  Now off norepinephrine. CVP trending up. Increase torsemide 40 mg daily. II considered IV lasix but for now increase torsemide. Complicate with RV failure and VT.  Continue spironolactone 12.5 daily was started on 7/29.  - Continue TED hose. Off ASA. Back on warfarin, INR 2.6 -> goal now is 2-2.5.  No further bleeding.  - Has been off dig/arb with elevated creatinine.  On low-dose spiro for gentle fluid removal, HTN and to keep K up. - Maps running high 90-100s. Increase hydralazine 75 mg tid and increase imdur 60 mg daily.  2. AKI on CKD stage 3: Suspect a component of peri-op ATN, worsened with ileus, vomiting, intubation/sedation.  Creatinine 2.04. BUN up a little.     3. Symptomatic anemia due to acute UGI bleeding:  Received 3 units PRBCs 7/16 and 3 units PRBCs on 05/04/2017. 1 unit PRBCs 7/20. 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  Requiring 2 clips + epi + APC. Does not appear to be bleeding actively now, hgb higher today.  - Continue Protonix 40 po bid. - Back on warfarin, aiming for INR 2-2.5 range. Will leave off aspirin for now. Dosing per pharm. - Transfuse hgb < 8. Hgb 8.5 today. Continue to follow. - Got Feraheme 7/28.   4.  Ventricular tachycardia: Status post ventricular tachycardia 2 on 7/13. Possible suction event. VAD speed turned down to 2700.  Recurrent VT 7/17. EP called to bedside. Overdrive pacing successful for about 10 minutes but VT recurred.  Milrinone turned off, remained in VT overnight but hemodynamically stable.  Speed decreased to 2660.  On 7/18, we were able to pace him out of VT.  Lidocaine added then stopped due to confusion/mental status changes.  He had VT 7/22 about 30 minutes after getting IV Lasix, had to be paced out again => may have been due to rapid fluid shift.  Pt paced out of VT 3 separate occasions 7/25. Got amio bolus x 2 and started back on IV infusion.  Transitioned to amiodarone 400 mg tid.  No VT over night.   - Off lidocaine with confusion.  -Continue mexiletine up to 300 mg every 8 hrs and amio 400 TID per EP.  5. CAD: s/p CABG 2012. No S/S ischemia.  Off aspirin due to GI bleeding. No change.   6. Gout: Severe tophaceous gout. Left shoulder pain ?gout.   - Completed 3 days of prednisone.   - Continue uloric.  7. Atrial fibrillation: Developed atrial fibrillation with RVR in setting of aspiration PNA on 7/4. - Continue amiodarone 400 tid..  - Today he is maintaining NSR.   8. Malnutrition: Now off TPN. Per Speech- FEEs performed. Prealbumin very low.  Nutrition recommendations appreciated.   9. Aspiration PNA with acute hypoxemic respiratory failure on 7/4: He was extubated on 7/6. Tracheal aspirate with Klebsiella and citrobacter. Both sensitive to Zosyn. Completed abx 7/13. No s/s active infection 10. Ileus: Resolved. Getting Reglan. No change. 11. Deconditioning:  Plan for CIR  when stable. OT/PT appreciated.   12. Acute Respiratory Failure: Intubated 7/17 in setting of complex GI intervention. Extubated 7/18. On 4 liters sats stable. No change. 13. Delirium: Resolved. No further. Appreciate neuro input Possibly related to lidocaine.  Lidocaine stopped. Confusion resolved. CT  of head negative. EEG ok. No further workup per neuro. No change today.  15. Insomnia: On remeron for sleep and depression. 16. Post-op depression: Remeron started 7/28.   I reviewed the HVAD parameters from today, and compared the results to the patient's prior recorded data.  No programming changes were made.  The HVAD is functioning within specified parameters.    Tonye BecketAmy Clegg, NP 05/20/2017, 9:40 AM  VAD Team --- VAD ISSUES ONLY--- Pager [phone] (7am - 7am) Advanced Heart Failure Team  Pager [phone] (M-F; 7a - 4p)  Please contact [facility] for night-coverage after hours (4p -7a ) and weekends on [email or web address]  Patient seen with NP, agree with the above note.    We did a ramp echo today.  Interventricular septum appeared midline at around 2600 rpm, so we decreased speed to 2600.   More volume overloaded today.  Creatinine is also a bit higher at 2.04 today. As above, we decreased speed today.  Hopefully this will allow more room to diurese without triggering VT.   - Increase torsemide to 40 mg daily.   No VT.  Continue current regimen of mexiletine and amiodarone.   Hemoglobin stable.    Hopefully to CIR by end of week.  Needs to work with PT today.   Marca AnconaDalton McLean 05/20/2017 3:31 PM

## 2017-05-20 NOTE — Progress Notes (Signed)
ANTICOAGULATION CONSULT NOTE - Follow Up Consult  Pharmacy Consult for Warfarin  Indication: HVAD  No Active Allergies  Patient Measurements: Height: 6' (182.9 cm) Weight: 249 lb (112.9 kg) (With VAD) IBW/kg (Calculated) : 77.6  Vital Signs: Temp: 97.4 F (36.3 C) (07/31 0855) Temp Source: Oral (07/31 0855) BP: 103/85 (07/31 0855) Pulse Rate: 69 (07/31 0855)  Labs:  Recent Labs  05/18/17 0521 05/19/17 0413 05/20/17 0459  HGB 8.2* 9.2* 8.5*  HCT 25.6* 29.0* 26.4*  PLT 348 387 383  LABPROT 27.7* 25.2* 28.7*  INR 2.53 2.25 2.64  CREATININE 1.65* 1.90* 2.04*    Estimated Creatinine Clearance: 53.7 mL/min (A) (by C-G formula based on SCr of 2.04 mg/dL (H)).   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 He developed an ileus on 7/5, warfarin was held, and heparin bridge started once INR fell below 1.8.  Heparin stopped 7/10 with INR 2.19. On 7/13, Hgb dropped and he had melena - warfarin held again and pharmacy asked to start heparin once INR < 1.8 pending GI workup.   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   Warfarin resumed 7/18 without heparin bridge. INR  trend up 2.6 today slightly > goal - s/p amio bolus yesterday for VT now stable will drop dose tonight   Hgb 8 low stable - has received multiple PRBCs. Diet advanced also getting 1 ensure and 1 boost a day> changing to ensure TID  Remains on Amiodarone drip> po> back on drip post iv boluses 7/25 for VT > po increased dose 400mg  TID and increased mexilitine dose.  Goal of Therapy:  INR 2-2.5 Monitor platelets by anticoagulation protocol: Yes   Plan:  1) Warfarin 0.5 mg x1 then maybe 1.5mg  daily start tomorrow 2) Daily INR  Leota SauersLisa Elnita Surprenant Pharm.D. CPP, BCPS Clinical Pharmacist [phone] 05/20/2017 9:39 AM

## 2017-05-20 NOTE — Discharge Instructions (Signed)
Information on my medicine - Coumadin®   (Warfarin) ° °This medication education was reviewed with me or my healthcare representative as part of my discharge preparation.   ° °Why was Coumadin prescribed for you? °Coumadin was prescribed for you because you have a blood clot or a medical condition that can cause an increased risk of forming blood clots. Blood clots can cause serious health problems by blocking the flow of blood to the heart, lung, or brain. Coumadin can prevent harmful blood clots from forming. °As a reminder your indication for Coumadin is:   Blood Clot Prevention After Heart Pump Surgery ° °What test will check on my response to Coumadin? °While on Coumadin (warfarin) you will need to have an INR test regularly to ensure that your dose is keeping you in the desired range. The INR (international normalized ratio) number is calculated from the result of the laboratory test called prothrombin time (PT). ° °If an INR APPOINTMENT HAS NOT ALREADY BEEN MADE FOR YOU please schedule an appointment to have this lab work done by your health care provider within 7 days. °Your INR goal is a number between:  2-2.5  ° °What  do you need to  know  About  COUMADIN? °Take Coumadin (warfarin) exactly as prescribed by your healthcare provider about the same time each day.  DO NOT stop taking without talking to the doctor who prescribed the medication.  Stopping without other blood clot prevention medication to take the place of Coumadin may increase your risk of developing a new clot or stroke.  Get refills before you run out. ° °What do you do if you miss a dose? °If you miss a dose, take it as soon as you remember on the same day then continue your regularly scheduled regimen the next day.  Do not take two doses of Coumadin at the same time. ° °Important Safety Information °A possible side effect of Coumadin (Warfarin) is an increased risk of bleeding. You should call your healthcare provider right away if you  experience any of the following: °? Bleeding from an injury or your nose that does not stop. °? Unusual colored urine (red or dark brown) or unusual colored stools (red or black). °? Unusual bruising for unknown reasons. °? A serious fall or if you hit your head (even if there is no bleeding). ° °Some foods or medicines interact with Coumadin® (warfarin) and might alter your response to warfarin. To help avoid this: °? Eat a balanced diet, maintaining a consistent amount of Vitamin K. °? Notify your provider about major diet changes you plan to make. °? Avoid alcohol or limit your intake to 1 drink for women and 2 drinks for men per day. °(1 drink is 5 oz. wine, 12 oz. beer, or 1.5 oz. liquor.) ° °Make sure that ANY health care provider who prescribes medication for you knows that you are taking Coumadin (warfarin).  Also make sure the healthcare provider who is monitoring your Coumadin knows when you have started a new medication including herbals and non-prescription products. ° °Coumadin® (Warfarin)  Major Drug Interactions  °Increased Warfarin Effect Decreased Warfarin Effect  °Alcohol (large quantities) °Antibiotics (esp. Septra/Bactrim, Flagyl, Cipro) °Amiodarone (Cordarone) °Aspirin (ASA) °Cimetidine (Tagamet) °Megestrol (Megace) °NSAIDs (ibuprofen, naproxen, etc.) °Piroxicam (Feldene) °Propafenone (Rythmol SR) °Propranolol (Inderal) °Isoniazid (INH) °Posaconazole (Noxafil) Barbiturates (Phenobarbital) °Carbamazepine (Tegretol) °Chlordiazepoxide (Librium) °Cholestyramine (Questran) °Griseofulvin °Oral Contraceptives °Rifampin °Sucralfate (Carafate) °Vitamin K  ° °Coumadin® (Warfarin) Major Herbal Interactions  °Increased Warfarin Effect Decreased Warfarin Effect  °  Garlic °Ginseng °Ginkgo biloba Coenzyme Q10 °Green tea °St. John’s wort   ° °Coumadin® (Warfarin) FOOD Interactions  °Eat a consistent number of servings per week of foods HIGH in Vitamin K °(1 serving = ½ cup)  °Collards (cooked, or boiled &  drained) °Kale (cooked, or boiled & drained) °Mustard greens (cooked, or boiled & drained) °Parsley *serving size only = ¼ cup °Spinach (cooked, or boiled & drained) °Swiss chard (cooked, or boiled & drained) °Turnip greens (cooked, or boiled & drained)  °Eat a consistent number of servings per week of foods MEDIUM-HIGH in Vitamin K °(1 serving = 1 cup)  °Asparagus (cooked, or boiled & drained) °Broccoli (cooked, boiled & drained, or raw & chopped) °Brussel sprouts (cooked, or boiled & drained) *serving size only = ½ cup °Lettuce, raw (green leaf, endive, romaine) °Spinach, raw °Turnip greens, raw & chopped  ° °These websites have more information on Coumadin (warfarin):  www.coumadin.com; °www.ahrq.gov/consumer/coumadin.htm; ° ° ° °

## 2017-05-20 NOTE — Progress Notes (Signed)
  Echocardiogram 2D Echocardiogram Limited RAMP has been performed.  James [name], James [name] 05/20/2017, 2:07 PM

## 2017-05-20 NOTE — Progress Notes (Signed)
Speed  Flow  Power  LVIDD  AI  Aortic openings  MR  TR  Septum  RV   2520  4.7 3.6 5.8 mild open 5/5  trace  mild midline Mod HK    2580 4.6 3.8 5.67 mild Open 5/5 trace mild midline Mild hk   2640 5.1 4.0 5.76 mild Open 5/5 Trace mild midline Mod hk   2700 5.2 4.4 5.6 Mild Open 5/5 Trace mod left Mod hk   2760 5.3 4.7 5.4 mod Open 5/5 trace Mod left Mod-severe HK   2820 5.7 5.1 5.2 mild Open 5/5   mod       Ramp ECHO performed at bedside.  At completion of ramp study, patients primary and back up controller programmed:  Fixed speed: 2600   Marcellus ScottLesley Wilson RN, VAD Coordinator 24/7 pager [phone]

## 2017-05-20 NOTE — Progress Notes (Signed)
LVAD Inpatient Coordinator Rounding Note:  Admitted 04/19/2017 due to A/C Heart failure.   [employer] LVAD implanted on Sep 03, 2017 by Dr. Laneta SimmersBartle as DT VAD.  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 He had epinephrine, APC, and 2 clips.   Vital signs: HR: 69  Doppler:  98 Auto BP:  103/85 (93) O2 Sat: 100% on RA Wt:207>209 > 213 >213 > 208>203>220>227lbs...........234>233>240>240>236>239>238>240>241>249  LVAD interrogation reveals:  Speed: 2660 Flow: 4.6 Power: 3.9w Alarms:none Peak: 7.3 Trough: 1.7 HCT: 26 Low flow alarm setting: 2.5 High watt alarm setting: 6   Suction: on  Lavare cycle: on  Blood Products: 6/28> 5 PRBC's, 6 FFP 7/1> 1 PRBC 7/9> 1 PRBC 7/13>3 PRBC 7/14>3 PRBC 7/15> 1 PRBC 7/16>3PRBC 7/17> 3 PRBC 7/20> 1 PRBC  Gtts: Milrinone restarted 7/8, stopped 7/16 Levo stopped 04/26/17 restarted 04/30/2017 - stopped 05/07/17 Amiodarone stopped 04/29/17, restarted 7/17 - transitioned to PO 05/13/17; VT on 05/15/17 - IV amio re-started - 30 mg/hr stopped 05/16/17 Heparin stopped 04/29/17 Lidocaine 05/07/17 - stopped 7/19  TPN - started 04/24/17 for nutritional support, stopped 04/30/17  Arrhythmia: 04/24/17 - Afib with RVR - started amiodarone 05/02/17 - 2 sustained episodes of VT - cardioverted x 1; second broke with overdrive pacing 7/17- wide complex tachycardia- Amio bolus x3 and gtt at 60 mg/hr 05/07/17 - sustained VT - converted with overdrive pacing; Lidocaine started in addition to amiodarone 05/08/17 - Lidocaine stopped due to confusion (lido level 12.4) 05/14/17 - sustained VT - converted 3 separate occassions with overdrive pacing 05/19/17 - sustained VT-converted with overdrive pacing   Respiratory: 04/23/17 - re-intubated due to respiratory failure secondary to suspected aspiration pneumonia 04/25/17-extubated 05/07/2017- intubated for EGD 04/24/2017- extubated  Drive Line: Daily Dressing Kits with  Aquacell AG silver strips per protocol.   Labs:  LDH trend:160 (pre VAD)>227>215......200>183>161>164>184>195>190>188>187>205>212>185>167>167>199>165  INR trend: 1.31>1.45>1.44>2.09>2.28>3.58>3.94>3.22>2.17>1.74>1.7>2.24>2.68>2.33>2.15>2.25>2.11>2.13>2.45>2.25>2.64  Anticoagulation Plan: -INR Goal: 2-2.5  -ASA Dose: 325 mg- on hold  Adverse Events on VAD: [facility] - 04/23/17 Ileus with vomiting; re-intubation for acute respiratory failure; probable aspiration pneumonia - 7/13-GI bleed - 7/19 - Lidocaine gtt stopped due to confusion (lido toxicity)    Plan/Recommendations:  1. Continue daily drive line dressing changes with patient's wife.  2. Please call VAD pager with patient and equipment concerns.. 3. [facility].  4. RAMP echo today at 1300.  Carlton AdamSarah Yarelli Decelles RN, VAD Coordinator 24/7 pager [phone]

## 2017-05-20 NOTE — Progress Notes (Signed)
CSW met with patient at bedside. Patient reports he is making progress and feeling better. Patient also stated that he was able to sleep last night which has contributed to his improved feeling. CSW will continue to follow for support throughout recovery. Raquel Sarna, [place], [place]

## 2017-05-20 NOTE — Progress Notes (Signed)
PT Cancellation Note  Patient Details Name: James FabianHarry D Headlee MRN: [id number] DOB: [birth year]   Cancelled Treatment:    Reason Eval/Treat Not Completed: Medical issues which prohibited therapy (pt set up and awaiting ramp Echo. will attempt next date)   Juwuan Sedita B Milanie Rosenfield 05/20/2017, 12:52 PM Delaney MeigsMaija Tabor Cloris Flippo, PT [phone]

## 2017-05-21 LAB — BASIC METABOLIC PANEL
Anion gap: 6 (ref 5–15)
BUN: 49 mg/dL — AB (ref 6–20)
CO2: 19 mmol/L — ABNORMAL LOW (ref 22–32)
Calcium: 7.9 mg/dL — ABNORMAL LOW (ref 8.9–10.3)
Chloride: 109 mmol/L (ref 101–111)
Creatinine, Ser: 2.01 mg/dL — ABNORMAL HIGH (ref 0.61–1.24)
GFR, EST AFRICAN AMERICAN: 42 mL/min — AB (ref >60)
GFR, EST NON AFRICAN AMERICAN: 36 mL/min — AB (ref >60)
Glucose, Bld: 235 mg/dL — ABNORMAL HIGH (ref 65–99)
POTASSIUM: 3.8 mmol/L (ref 3.5–5.1)
SODIUM: 134 mmol/L — AB (ref 135–145)

## 2017-05-21 LAB — GLUCOSE, CAPILLARY
GLUCOSE-CAPILLARY: 203 mg/dL — AB (ref 65–99)
Glucose-Capillary: 167 mg/dL — ABNORMAL HIGH (ref 65–99)
Glucose-Capillary: 232 mg/dL — ABNORMAL HIGH (ref 65–99)
Glucose-Capillary: 253 mg/dL — ABNORMAL HIGH (ref 65–99)

## 2017-05-21 LAB — LACTATE DEHYDROGENASE: LDH: 151 U/L (ref 98–192)

## 2017-05-21 LAB — MAGNESIUM: MAGNESIUM: 1.8 mg/dL (ref 1.7–2.4)

## 2017-05-21 LAB — PROTIME-INR
INR: 2.83
Prothrombin Time: 30.3 seconds — ABNORMAL HIGH (ref 11.4–15.2)

## 2017-05-21 LAB — COOXEMETRY PANEL
Carboxyhemoglobin: 1.7 % — ABNORMAL HIGH (ref 0.5–1.5)
METHEMOGLOBIN: 1.3 % (ref 0.0–1.5)
O2 Saturation: 72.7 %
TOTAL HEMOGLOBIN: 10 g/dL — AB (ref 12.0–16.0)

## 2017-05-21 MED ORDER — MAGNESIUM SULFATE 4 GM/100ML IV SOLN
4.0000 g | Freq: Once | INTRAVENOUS | Status: AC
  Administered 2017-05-21: 4 g via INTRAVENOUS
  Filled 2017-05-21: qty 100

## 2017-05-21 MED ORDER — POTASSIUM CHLORIDE CRYS ER 20 MEQ PO TBCR
40.0000 meq | EXTENDED_RELEASE_TABLET | Freq: Once | ORAL | Status: AC
  Administered 2017-05-21: 40 meq via ORAL
  Filled 2017-05-21: qty 2

## 2017-05-21 MED ORDER — POTASSIUM CHLORIDE CRYS ER 20 MEQ PO TBCR
20.0000 meq | EXTENDED_RELEASE_TABLET | Freq: Once | ORAL | Status: AC
  Administered 2017-05-21: 20 meq via ORAL
  Filled 2017-05-21: qty 1

## 2017-05-21 MED ORDER — FUROSEMIDE 10 MG/ML IJ SOLN
40.0000 mg | Freq: Once | INTRAMUSCULAR | Status: AC
  Administered 2017-05-21: 40 mg via INTRAVENOUS
  Filled 2017-05-21: qty 4

## 2017-05-21 MED ORDER — AMLODIPINE BESYLATE 5 MG PO TABS
2.5000 mg | ORAL_TABLET | Freq: Every day | ORAL | Status: DC
  Administered 2017-05-21: 2.5 mg via ORAL
  Filled 2017-05-21: qty 1

## 2017-05-21 MED ORDER — HYDRALAZINE HCL 50 MG PO TABS
100.0000 mg | ORAL_TABLET | Freq: Three times a day (TID) | ORAL | Status: DC
  Administered 2017-05-21 – 2017-05-27 (×18): 100 mg via ORAL
  Filled 2017-05-21 (×20): qty 2

## 2017-05-21 MED ORDER — TORSEMIDE 20 MG PO TABS
60.0000 mg | ORAL_TABLET | Freq: Every day | ORAL | Status: DC
  Administered 2017-05-22: 60 mg via ORAL
  Filled 2017-05-21: qty 3

## 2017-05-21 NOTE — Progress Notes (Signed)
Patient ID: James FabianHarry D [name], male   DOB: 1961-03-24, 56 y.o.   MRN: [id number]  This NP visited patient at the bedside as a follow up for palliative needs and emotional support.  Created space and opportunity for him to share thoughts and feelings regarding his medical  expericnece James [name] and I spoke about importance of postive attitude and perspective  Placed call to wife James HashimotoMarcie, left voice message.  Discussed with patient the importance of continued conversation with family and their  medical providers regarding overall plan of care and treatment options,  ensuring decisions are within the context of the patients values and GOCs.  Questions and concerns addressed  Time in  1700         Time out  1720  Total time spent on the  Unit was 20 minutes  Greater than 50% of the time was spent in counseling and coordination of care  James CreedMary Leldon Steege NP  Palliative Medicine Team Team Phone # [phone] Pager [phone]

## 2017-05-21 NOTE — Progress Notes (Signed)
LVAD Inpatient Coordinator Rounding Note:  Admitted 09/16/2017 due to A/C Heart failure.   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 He had epinephrine, APC, and 2 clips.   Vital signs: HR: 70 Doppler:  105 Auto BP:  126/93 (104) O2 Sat: 100% on RA Wt:207>209 > 213 >213 > 208>203>220>227lbs...........234>233>240>240>236>239>238>240>241>249>248  LVAD interrogation reveals:  Speed: 2600 Flow: 4.1 Power: 4w Alarms:none Peak: 7.5 Trough: 2.8 HCT: 26 Low flow alarm setting: 2.5 High watt alarm setting: 6   Suction: on  Lavare cycle: on  Blood Products: 6/28> 5 PRBC's, 6 FFP 7/1> 1 PRBC 7/9> 1 PRBC 7/13>3 PRBC 7/14>3 PRBC 7/15> 1 PRBC 7/16>3PRBC 7/17> 3 PRBC 7/20> 1 PRBC  Gtts: Milrinone restarted 7/8, stopped 7/16 Levo stopped 04/26/17 restarted 05/19/2017 - stopped 05/07/17 Amiodarone stopped 04/29/17, restarted 7/17 - transitioned to PO 05/13/17; VT on 05/15/17 - IV amio re-started - 30 mg/hr stopped 05/16/17 Heparin stopped 04/29/17 Lidocaine 05/07/17 - stopped 7/19  TPN - started 04/24/17 for nutritional support, stopped 04/30/17  Arrhythmia: 04/24/17 - Afib with RVR - started amiodarone 05/02/17 - 2 sustained episodes of VT - cardioverted x 1; second broke with overdrive pacing 7/17- wide complex tachycardia- Amio bolus x3 and gtt at 60 mg/hr 05/07/17 - sustained VT - converted with overdrive pacing; Lidocaine started in addition to amiodarone 05/08/17 - Lidocaine stopped due to confusion (lido level 12.4) 05/14/17 - sustained VT - converted 3 separate occassions with overdrive pacing 05/19/17 - sustained VT-converted with overdrive pacing   Respiratory: 04/23/17 - re-intubated due to respiratory failure secondary to suspected aspiration pneumonia 04/25/17-extubated 05/05/2017- intubated for EGD 05/20/2017- extubated  Drive Line: Daily Dressing Kits  with Aquacell AG silver strips per protocol.   Labs:  LDH trend:160 (pre VAD)>227>215......200>183>161>164>184>195>190>188>187>205>212>185>167>167>199>165>151  INR trend: 1.31>1.45>1.44>2.09>2.28>3.58>3.94>3.22>2.17>1.74>1.7>2.24>2.68>2.33>2.15>2.25>2.11>2.13>2.45>2.25>2.64>2.83  Anticoagulation Plan: -INR Goal: 2-2.5  -ASA Dose: 325 mg- on hold  Adverse Events on VAD: - [facility]inal [facility] - 04/23/17 Ileus with vomiting; re-intubation for acute respiratory failure; probable aspiration pneumonia - 7/13-GI bleed - 7/19 - Lidocaine gtt stopped due to confusion (lido toxicity)    Plan/Recommendations:  1. Continue daily drive line dressing changes with patient's wife.  2. Please call VAD pager with patient and equipment concerns.. 3. [facility] if [facility] this week.    Marcellus ScottLesley Kaneesha Constantino RN, VAD Coordinator 24/7 pager [phone]

## 2017-05-21 NOTE — Progress Notes (Signed)
CSW met with patient at bedside. Patient reports he is "holding a lot of fluid" and appeared down about lengthy hospitalization. Patient states his wife continues to visit often and is very supportive but is also ready for patient to be home. CSW offered support and will continue to follow through implant hospitalization. [name] Brennan, LCSW, CCSW-MCS [phone]    

## 2017-05-21 NOTE — Progress Notes (Signed)
Occupational Therapy Treatment Patient Details Name: James [name] MRN: [id number] DOB: 13-Dec-1960 Today's Date: 05/21/2017    History of present illness  Pt adm with acute on chronic heart failure and underwent Heartware HVAD implant on 6/28. Returned to OR later that day for evacuation of mediastinal hematoma. On 7/4 developed ileus with likely aspiration and intubated 7/4-7/6.   Pt developed Heme + stools with progressive anemia as well as  Kirby FunkVtach 05/02/17.  He underwent EGD with showed suspected AVM 04/27/2017. He was intubated for procedure and extubated 05/08/17 PMH - gout, DMII, HTN, CKD, CAD S/P CABGx6 2012, chronic systolic heart failure and St jude ICD.    OT comments  Pt improved when compared to Monday 05/19/17, but is not as good as he was a week ago.  He required min - mod A +2 for sit to stand, and was able to ambulate ~810ft with mod A +2 before fatiguing.  He was able to feed himself today with set up assist, while seated in chair.  Cognition is improved, but still demonstrates delayed processing and initiation.   Will continue to follow.   Follow Up Recommendations  CIR;Supervision/Assistance - 24 hour    Equipment Recommendations  3 in 1 bedside commode    Recommendations for Other Services Rehab consult    Precautions / Restrictions Precautions Precautions: Sternal Precaution Comments: Heartware HVAD Restrictions Weight Bearing Restrictions: Yes Other Position/Activity Restrictions: Sternal precautions       Mobility Bed Mobility Overal bed mobility: Needs Assistance Bed Mobility: Supine to Sit     Supine to sit: Max assist;HOB elevated     General bed mobility comments: assist to bring bilat LE to EOB, elevate trunk into sitting, and bring hips toward EOB with use of chuck pad; cues for sequencing and technique  Transfers Overall transfer level: Needs assistance Equipment used: Rolling walker (2 wheeled) Transfers: Sit to/from Stand Sit to Stand: Min assist;Mod  assist;+2 physical assistance;From elevated surface         General transfer comment: pt stood X5 from EOB; min A +2 from elevated surface and mod A +2 from lower surface; cues for hand placement and technique; assist to power up into standing and steady upon stand    Balance Overall balance assessment: Needs assistance Sitting-balance support: No upper extremity supported;Feet supported Sitting balance-Leahy Scale: Good     Standing balance support: Bilateral upper extremity supported;During functional activity Standing balance-Leahy Scale: Poor Standing balance comment: Pt requires min - mod A and bil. UE support                            ADL either performed or assessed with clinical judgement   ADL Overall ADL's : Needs assistance/impaired Eating/Feeding: Set up;Sitting Eating/Feeding Details (indicate cue type and reason): able to feed self sitting in recliner with foam built up handle on utensil      Upper Body Bathing: Maximal assistance;Sitting   Lower Body Bathing: Maximal assistance;Sit to/from stand           Toilet Transfer: Moderate assistance;+2 for physical assistance;Stand-pivot;BSC;RW   Toileting- Clothing Manipulation and Hygiene: Total assistance;Sit to/from stand       Functional mobility during ADLs: Moderate assistance;+2 for physical assistance;Rolling walker       Vision       Perception     Praxis      Cognition Arousal/Alertness: Awake/alert Behavior During Therapy: Flat affect;WFL for tasks assessed/performed Overall Cognitive Status: Impaired/Different  from baseline Area of Impairment: Following commands;Awareness;Problem solving                       Following Commands: Follows one step commands with increased time   Awareness: Intellectual Problem Solving: Slow processing;Decreased initiation;Difficulty sequencing;Requires verbal cues General Comments: Pt more interactive and appropriate today, but  processing is slower than his normal         Exercises     Shoulder Instructions       General Comments VSS during session     Pertinent Vitals/ Pain       Pain Assessment: No/denies pain Faces Pain Scale: Hurts little more Pain Location: L shoulder Pain Descriptors / Indicators: Discomfort;Sore Pain Intervention(s): Monitored during session;Repositioned  Home Living                                          Prior Functioning/Environment              Frequency  Min 3X/week        Progress Toward Goals  OT Goals(current goals can now be found in the care plan section)  Progress towards OT goals: Progressing toward goals (slowly )     Plan Discharge plan remains appropriate    Co-evaluation    PT/OT/SLP Co-Evaluation/Treatment: Yes Reason for Co-Treatment: Complexity of the patient's impairments (multi-system involvement);To address functional/ADL transfers   OT goals addressed during session: ADL's and self-care      AM-PAC PT "6 Clicks" Daily Activity     Outcome Measure   Help from another person eating meals?: A Little Help from another person taking care of personal grooming?: A Little Help from another person toileting, which includes using toliet, bedpan, or urinal?: A Lot Help from another person bathing (including washing, rinsing, drying)?: A Lot Help from another person to put on and taking off regular upper body clothing?: A Lot Help from another person to put on and taking off regular lower body clothing?: Total 6 Click Score: 13    End of Session    OT Visit Diagnosis: Muscle weakness (generalized) (M62.81)   Activity Tolerance Patient tolerated treatment well   Patient Left in chair;with call bell/phone within reach   Nurse Communication Mobility status        Time: [id number] OT Time Calculation (min): 75 min  Charges: OT General Charges $OT Visit: 1 Procedure  Jeani HawkingWendi Micheil Klaus,  OTR/L [phone]    [name], Juliah Scadden M 05/21/2017, 5:15 PM

## 2017-05-21 NOTE — Progress Notes (Signed)
   Called by nursing staff for VT.   Recurrent VT 110s. Asymptomatic. Map 80s.   St Jude rep at bedside and paced out of VT.   EP aware and will come back by for any other suggestions.   Dr Shirlee LatchMcLean aware.  Alexandrina Fiorini NP-C  3:08 PM

## 2017-05-21 NOTE — Progress Notes (Signed)
Physical Therapy Treatment Patient Details Name: James [name] MRN: [id number] DOB: [birth year] Today's Date: 05/21/2017    History of Present Illness  Pt adm with acute on chronic heart failure and underwent Heartware HVAD implant on 6/28. Returned to OR later that day for evacuation of mediastinal hematoma. On 7/4 developed ileus with likely aspiration and intubated 7/4-7/6.   Pt developed Heme + stools with progressive anemia as well as  Kirby FunkVtach 05/02/17.  He underwent EGD with showed suspected AVM 05/12/2017. He was intubated for procedure and extubated 05/08/17 PMH - gout, DMII, HTN, CKD, CAD S/P CABGx6 2012, chronic systolic heart failure and St jude ICD.     PT Comments    Patient is making progress toward mobility goals. Pt tolerated standing X5 from EOB and short distance gait. Pt required +2 physical assist for transfers and +2 for safety with ambulation. Continue to recommend CIR for further skilled PT services to maximize independence and safety with mobility.    Follow Up Recommendations  CIR;Supervision for mobility/OOB     Equipment Recommendations  Rolling walker with 5" wheels;3in1 (PT)    Recommendations for Other Services       Precautions / Restrictions Precautions Precautions: Sternal Precaution Comments: Heartware HVAD Restrictions Weight Bearing Restrictions: Yes Other Position/Activity Restrictions: Sternal precautions    Mobility  Bed Mobility Overal bed mobility: Needs Assistance Bed Mobility: Supine to Sit     Supine to sit: Max assist;HOB elevated     General bed mobility comments: assist to bring bilat LE to EOB, elevate trunk into sitting, and bring hips toward EOB with use of chuck pad; cues for sequencing and technique  Transfers Overall transfer level: Needs assistance Equipment used: Rolling walker (2 wheeled) Transfers: Sit to/from Stand Sit to Stand: Min assist;Mod assist;+2 physical assistance;From elevated surface         General transfer  comment: pt stood X5 from EOB; min A +2 from elevated surface and mod A +2 from lower surface; cues for hand placement and technique; assist to power up into standing and steady upon stand  Ambulation/Gait Ambulation/Gait assistance: Min assist;+2 safety/equipment;+2 physical assistance Ambulation Distance (Feet): 10 Feet Assistive device: Rolling walker (2 wheeled) Gait Pattern/deviations: Step-through pattern;Decreased step length - right;Decreased step length - left;Decreased dorsiflexion - right;Decreased dorsiflexion - left;Trunk flexed     General Gait Details: multimodal cues for posture and assist for balance and mangement of RW; pt maintained flexed trunk and bilat LE and demonstrated increased bilat LE fatigue just prior to sitting    Stairs            Wheelchair Mobility    Modified Rankin (Stroke Patients Only)       Balance   Sitting-balance support: No upper extremity supported;Feet supported Sitting balance-Leahy Scale: Good     Standing balance support: Bilateral upper extremity supported;During functional activity Standing balance-Leahy Scale: Poor                              Cognition Arousal/Alertness: Awake/alert Behavior During Therapy: Flat affect;WFL for tasks assessed/performed Overall Cognitive Status: Impaired/Different from baseline Area of Impairment: Following commands;Awareness;Problem solving                       Following Commands: Follows one step commands with increased time   Awareness: Intellectual Problem Solving: Slow processing;Decreased initiation;Difficulty sequencing;Requires verbal cues        Exercises  General Comments        Pertinent Vitals/Pain Pain Assessment: Faces Faces Pain Scale: Hurts little more Pain Location: L shoulder Pain Descriptors / Indicators: Discomfort;Sore Pain Intervention(s): Monitored during session;Repositioned    Home Living                       Prior Function            PT Goals (current goals can now be found in the care plan section) Progress towards PT goals: Progressing toward goals    Frequency    Min 3X/week      PT Plan Current plan remains appropriate    Co-evaluation              AM-PAC PT "6 Clicks" Daily Activity  Outcome Measure  Difficulty turning over in bed (including adjusting bedclothes, sheets and blankets)?: A Lot Difficulty moving from lying on back to sitting on the side of the bed? : Total Difficulty sitting down on and standing up from a chair with arms (e.g., wheelchair, bedside commode, etc,.)?: Total Help needed moving to and from a bed to chair (including a wheelchair)?: A Little Help needed walking in hospital room?: A Little Help needed climbing 3-5 steps with a railing? : A Lot 6 Click Score: 12    End of Session Equipment Utilized During Treatment: Gait belt Activity Tolerance: Patient tolerated treatment well Patient left: in chair;with call bell/phone within reach Nurse Communication: Mobility status PT Visit Diagnosis: Unsteadiness on feet (R26.81);Muscle weakness (generalized) (M62.81);Difficulty in walking, not elsewhere classified (R26.2);Other abnormalities of gait and mobility (R26.89);Pain Pain - Right/Left: Left Pain - part of body: Shoulder     Time: 1100-1149 PT Time Calculation (min) (ACUTE ONLY): 49 min  Charges:  $Gait Training: 8-22 mins $Therapeutic Activity: 8-22 mins                    G Codes:       Erline LevineKellyn Rasool Rommel, PTA Pager: [phone]     Carolynne EdouardKellyn R Jearldine Cassady 05/21/2017, 3:57 PM

## 2017-05-21 NOTE — Progress Notes (Signed)
ANTICOAGULATION CONSULT NOTE - Follow Up Consult  Pharmacy Consult for Warfarin  Indication: HVAD  No Active Allergies  Patient Measurements: Height: 6' (182.9 cm) Weight: 248 lb 12.8 oz (112.9 kg) IBW/kg (Calculated) : 77.6  Vital Signs: Temp: 97.8 F (36.6 C) (08/01 1210) Temp Source: Oral (08/01 1210) BP: 118/91 (08/01 1210) Pulse Rate: 70 (08/01 0933)  Labs:  Recent Labs  05/19/17 0413 05/20/17 0459 05/21/17 0500  HGB 9.2* 8.5*  --   HCT 29.0* 26.4*  --   PLT 387 383  --   LABPROT 25.2* 28.7* 30.3*  INR 2.25 2.64 2.83  CREATININE 1.90* 2.04* 2.01*    Estimated Creatinine Clearance: 54.5 mL/min (A) (by C-G formula based on SCr of 2.01 mg/dL (H)).   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 He developed an ileus on 7/5, warfarin was held, and heparin bridge started once INR fell below 1.8.  Heparin stopped 7/10 with INR 2.19. On 7/13, Hgb dropped and he had melena - warfarin held again and pharmacy asked to start heparin once INR < 1.8 pending GI workup.   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   Warfarin resumed 7/18 without heparin bridge. INR  trend up 2.8 today slightly.  No CBC today, but Hgb 8 low stable recently  - has received multiple PRBCs. Diet advanced also getting 1 ensure and 1 boost a day> changing to ensure TID  Remains on Amiodarone drip> po> back on drip post iv boluses 7/25 for VT > po increased dose 400mg  TID and increased mexilitine dose.  Goal of Therapy:  INR 2-2.5 Monitor platelets by anticoagulation protocol: Yes   Plan:  1) Hold warfarin dose for tonight - hopefully INR will be < 2.5 tomorrow. 2) Daily PT/INR.  Tad MooreJessica Nitish Roes, Pharm D, BCPS  Clinical Pharmacist Pager [phone]  05/21/2017 2:51 PM

## 2017-05-21 NOTE — Progress Notes (Signed)
Patient ID: James [name], male   DOB: 1961/08/08, 56 y.o.   MRN: [id number]   Advanced Heart Failure VAD Team Note  Subjective:    Events: --HVAD placed 6/28.  Returned to the OR that evening with high chest tube output, evacuation of mediastinal hematoma.  --Extubated 6/29. Milrinone stopped on 7/4. --7/4, patient developed ileus with respiratory compromise. NGT placed with 3 L suctioned out.  CXR with suspicion for aspiration PNA.  Patient had to be intubated.  He went into atrial fibrillation with RVR.  He became hypotensive and was started on norepinephrine and phenylephrine.  Amiodarone gtt begun.    --Extubated again on 7/6.  -- 7/13 Had 2 sustained episodes of VT. Had to be cardioverted 1. Second episode broke with overdrive pacing with Dr. Graciela HusbandsKlein. VAD speed turned down to 2700.  --Melena due to acuteGI bleed --> 2 units PRBCs 7/14, 2 units 7/15. 3 U PRBCs 7/16,  05/19/2017 3 UPRBCs.  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  He had epinephrine, APC, and 2 clips. Intubated prior to procedure and placed on norepinephrine. Speed dropped to 2660.  --7/18 Back in VT with overdrive pacing. Lidocaine was started in addition to amiodarone. Extubated. --7/19 lidocaine stopped with confusion. Neuro consulted.  EEG normal.  CT of head no acute findings. 7/20 confusion had resolved.  --1 unit PRBCs 7/20.  --7/22, developed sustained VT with rate around 130 shortly after getting IV Lasix.  We were able to pace him out and back to NSR.  He was put back on amiodarone gtt.  --More VT on 7/25, paced out.  Back on IV amiodarone gtt.  NSR.  -- 7/31 Ramp ECHO decrease speed 2600 rpm.   Yesterday hydralazine and torsemide increased. CVP down 11.  Able to walk 10 feet with PT.  Denies SOB. Feeling a little stronger.   HVAD INTERROGATION:  HVAD:  Flow 4.1 liters/min, speed 2600,  power 4 W,  Peak 7.5 Trough 2.8.  Suction On. Lavare On. No alarms.     Objective:    Vital Signs:   Temp:  [97.7 F (36.5 C)-99 F (37.2 C)] 98.9 F (37.2 C) (08/01 0933) Pulse Rate:  [70-77] 70 (08/01 0933) Resp:  [16-21] 21 (08/01 0933) BP: (110-128)/(88-98) 126/93 (08/01 0933) SpO2:  [98 %-100 %] 100 % (08/01 0933) Weight:  [248 lb 11.2 oz (112.8 kg)-248 lb 12.8 oz (112.9 kg)] 248 lb 12.8 oz (112.9 kg) (08/01 1135) Last BM Date: 05/20/17 Mean arterial Pressure 100s  Intake/Output:   Intake/Output Summary (Last 24 hours) at 05/21/17 1158 Last data filed at 05/21/17 0600  Gross per 24 hour  Intake              989 ml  Output              800 ml  Net              189 ml     Physical Exam     Physical Exam: CVP 11-12 personally checked in the chair.  GENERAL: NAD. In the chair.  HEENT: normal  NECK: Supple, JVP 11-12 .  2+ bilaterally, no bruits.  No lymphadenopathy or thyromegaly appreciated.   CARDIAC:  Mechanical heart sounds with LVAD hum present.  LUNGS:  Clear to auscultation bilaterally.  ABDOMEN:  Soft, round, nontender, positive bowel sounds x4.     LVAD exit site:  Dressing dry and intact.  No erythema or drainage.  Stabilization  device present and accurately applied.  Driveline dressing is being changed daily per sterile technique. EXTREMITIES:  Warm and dry, no cyanosis, clubbing, rash. Gouty changes in hands. R and LLE 2-3+  or edema  NEUROLOGIC:  Alert and oriented x 4.  Gait steady.  No aphasia.  No dysarthria.  Affect pleasant.      Telemetry    Personally reviewed.  NSR 70s.   Labs   Basic Metabolic Panel:  Recent Labs Lab 05/17/17 0427 05/18/17 0521 05/19/17 0413 05/20/17 0459 05/21/17 0500  NA 134* 134* 136 134* 134*  K 3.9 3.8 4.5 4.2 3.8  CL 110 109 110 108 109  CO2 18* 19* 18* 18* 19*  GLUCOSE 73 92 174* 238* 235*  BUN 37* 38* 39* 47* 49*  CREATININE 1.61* 1.65* 1.90* 2.04* 2.01*  CALCIUM 7.6* 7.7* 7.9* 7.9* 7.9*  MG  --  1.8 2.0  --  1.8    Liver Function Tests: No results for input(s): AST,  ALT, ALKPHOS, BILITOT, PROT, ALBUMIN in the last 168 hours. No results for input(s): LIPASE, AMYLASE in the last 168 hours. No results for input(s): AMMONIA in the last 168 hours.  CBC:  Recent Labs Lab 05/16/17 0330 05/17/17 0427 05/18/17 0521 05/19/17 0413 05/20/17 0459  WBC 4.9 4.6 7.1 8.9 6.2  NEUTROABS  --   --   --  8.5*  --   HGB 8.2* 8.3* 8.2* 9.2* 8.5*  HCT 25.8* 25.3* 25.6* 29.0* 26.4*  MCV 89.6 89.7 88.9 89.8 89.2  PLT 323 314 348 387 383    INR:  Recent Labs Lab 05/17/17 0427 05/18/17 0521 05/19/17 0413 05/20/17 0459 05/21/17 0500  INR 2.45 2.53 2.25 2.64 2.83    Other results:     Imaging   No results found.   Medications:     Scheduled Medications: . amiodarone  400 mg Oral Q8H  . febuxostat  120 mg Oral Daily  . feeding supplement (ENSURE ENLIVE)  237 mL Oral TID BM  . hydrALAZINE  75 mg Oral Q8H  . insulin aspart  0-15 Units Subcutaneous TID WC  . isosorbide mononitrate  30 mg Oral Daily  . mexiletine  300 mg Oral Q8H  . mirtazapine  15 mg Oral QHS  . pantoprazole  40 mg Oral BID  . sodium chloride flush  10-40 mL Intracatheter Q12H  . spironolactone  25 mg Oral Daily  . torsemide  40 mg Oral Daily  . Warfarin - Pharmacist Dosing Inpatient   Does not apply q1800    Infusions: . sodium chloride Stopped (05/02/17 0630)  . sodium chloride 10 mL/hr at 05/16/17 1800    PRN Medications: albuterol, docusate, hydrALAZINE, neomycin-bacitracin-polymyxin, ondansetron (ZOFRAN) IV, oxyCODONE, phenol, sodium chloride flush, traMADol   Patient Profile   56 yo with CAD s/p CABG, ischemic cardiomyopathy/chronic systolic CHF, tophaceous gout, and CKD stage 3 was admitted for diuresis and consideration for LVAD placement. S/p HVAD on 6/28  Assessment/Plan:    1. Acute/chronic systolic CHF s/p HVAD: Ischemic cardiomyopathy.  St Jude ICD.  Echo (6/18) with EF 15%, mildly dilated RV with moderately decreased systolic function.  s/p HVAD placement  6/28 and had to return to OR to evacuate mediastinal hematoma.  He had been weaned off pressors/milrinone, but developed ileus w/ likely aspiration event 7/4, re-intubated, developed afib/RVR requiring norepinephrine. Extubated 7/6.  VAD speed turned down to 2660 with VT. VAD parameters currently look good.  Milrinone stopped 7/17 early am with recurrent VT.  Now off  norepinephrine. Todays CO-OX is 73%. CVP down from 15>12. Give 40 mg IV lasix now. He already had torsemide this morning. Tomorrow I will increase torsemide to 60 mg daily. Renal function stable.     Complicated with RV failure and VT.  Continue spironolactone 12.5 daily was started on 7/29. Continue  . Off ASA with GI Bleed Continue  warfarin, INR 2.8  -> goal now is 2-2.5.  No further bleeding. Discussed with Pharm D.  - Has been off dig/arb with elevated creatinine.   - Maps running high. Increase hydralazine to 100 mg three times a day imdur 60 mg daily.  - Continue spironolactone 25 daily.  2. AKI on CKD stage 3: Suspect a component of peri-op ATN, worsened with ileus, vomiting, intubation/sedation. Creatinine stable 2.01.  BMET daily.     3. Symptomatic anemia due to acute UGI bleeding:  Received 3 units PRBCs 7/16 and 3 units PRBCs on 05/18/2017. 1 unit PRBCs 7/20. 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  Requiring 2 clips + epi + APC. Does not appear to be bleeding actively now, hgb higher today.  - Continue Protonix 40 po bid. - Back on warfarin, aiming for INR 2-2.5 range. Will leave off aspirin for now. Dosing per pharm. - Transfuse hgb < 8.  - Check CBC today and daily.  - Got Feraheme 7/28.   4. Ventricular tachycardia: Status post ventricular tachycardia 2 on 7/13. Possible suction event. VAD speed turned down to 2700.  Recurrent VT 7/17. EP called to bedside. Overdrive pacing successful for about 10 minutes but VT recurred.  Milrinone turned off, remained in VT overnight but  hemodynamically stable.  Speed decreased to 2660.  On 7/18, we were able to pace him out of VT.  Lidocaine added then stopped due to confusion/mental status changes.  He had VT 7/22 about 30 minutes after getting IV Lasix, had to be paced out again => may have been due to rapid fluid shift.  Pt paced out of VT 3 separate occasions 7/25. Got amio bolus x 2 and started back on IV infusion.  Transitioned to amiodarone 400 mg tid.  No VT over night.   - Off lidocaine with confusion.  -Continue mexiletine up to 300 mg every 8 hrs and amio 400 TID per EP.  K 3.8  Mag 1.8. Give 4 grams Mag.   5. CAD: s/p CABG 2012. No S/S ischemia.  Off aspirin due to GI bleeding. No change.   6. Gout: Severe tophaceous gout. Left shoulder pain ?gout.   - Completed 3 days of prednisone.   - Continue uloric.  7. Atrial fibrillation: Developed atrial fibrillation with RVR in setting of aspiration PNA on 7/4. - Continue amiodarone 400 tid. Maintaining NSR.    8. Malnutrition: Now off TPN. Per Speech- FEEs performed. Prealbumin very low.  Nutrition recommendations appreciated.  Continue supplements.  9. Aspiration PNA with acute hypoxemic respiratory failure on 7/4: He was extubated on 7/6. Tracheal aspirate with Klebsiella and citrobacter. Both sensitive to Zosyn. Completed abx 7/13. No s/s active infection 10. Ileus: Resolved. Getting Reglan. No change. 11. Deconditioning:  Plan for CIR when stable. OT/PT appreciated.   12. Acute Respiratory Failure: Intubated 7/17 in setting of complex GI intervention. Extubated 7/18. On 4 liters sats stable. No change. 13. Delirium: Resolved. No further. Appreciate neuro input Possibly related to lidocaine.  Lidocaine stopped. Confusion resolved. CT of head negative. EEG ok. No further workup per neuro. No change  today.  15. Insomnia: On remeron for sleep and depression. 16. Post-op depression: Remeron started 7/28.   I reviewed the HVAD parameters from today, and compared the results  to the patient's prior recorded data.  No programming changes were made.  The HVAD is functioning within specified parameters.    Hopefully can go to CIR later this week.   Tonye BecketAmy Clegg, NP 05/21/2017, 11:58 AM  VAD Team --- VAD ISSUES ONLY--- Pager [phone] (7am - 7am) Advanced Heart Failure Team  Pager [phone] (M-F; 7a - 4p)  Please contact [facility] for night-coverage after hours (4p -7a ) and weekends on [email or web address]  Patient seen with NP, agree with the above note.  Creatinine stable today, pending CBC.  He is feeling stronger.  No VT.   He is volume overloaded with weight up.  CVP 11 today.  Lots of swelling in legs.  Moving carefully with diuresis as fluid shifts in the past have triggered VT.  - Give Lasix 40 mg IV x 1 now and increase torsemide to 60 mg daily tomorrow.  Will need to mobilize fluid slowly.   No overt bleeding.   MAP remains high.  Will increase hydralazine and add low dose amlodipine 2.5 daily.   Continue to mobilize, hopefully to CIR by end of week.   Marca AnconaDalton Melesio Madara 05/21/2017 1:32 PM

## 2017-05-21 DEATH — deceased

## 2017-05-22 LAB — GLUCOSE, CAPILLARY
GLUCOSE-CAPILLARY: 141 mg/dL — AB (ref 65–99)
Glucose-Capillary: 205 mg/dL — ABNORMAL HIGH (ref 65–99)
Glucose-Capillary: 183 mg/dL — ABNORMAL HIGH (ref 65–99)
Glucose-Capillary: 151 mg/dL — ABNORMAL HIGH (ref 65–99)

## 2017-05-22 LAB — BASIC METABOLIC PANEL
Anion gap: 6 (ref 5–15)
BUN: 50 mg/dL — AB (ref 6–20)
CALCIUM: 7.9 mg/dL — AB (ref 8.9–10.3)
CO2: 21 mmol/L — AB (ref 22–32)
CREATININE: 2.02 mg/dL — AB (ref 0.61–1.24)
Chloride: 111 mmol/L (ref 101–111)
GFR calc non Af Amer: 36 mL/min — ABNORMAL LOW (ref >60)
GFR, EST AFRICAN AMERICAN: 41 mL/min — AB (ref >60)
GLUCOSE: 157 mg/dL — AB (ref 65–99)
Potassium: 4.3 mmol/L (ref 3.5–5.1)
Sodium: 138 mmol/L (ref 135–145)

## 2017-05-22 LAB — BRAIN NATRIURETIC PEPTIDE: B NATRIURETIC PEPTIDE 5: 1523.2 pg/mL — AB (ref 0.0–100.0)

## 2017-05-22 LAB — COOXEMETRY PANEL
CARBOXYHEMOGLOBIN: 1.2 % (ref 0.5–1.5)
Methemoglobin: 1.4 % (ref 0.0–1.5)
O2 SAT: 70.5 %
Total hemoglobin: 8.4 g/dL — ABNORMAL LOW (ref 12.0–16.0)

## 2017-05-22 LAB — CBC
HCT: 26.7 % — ABNORMAL LOW (ref 39.0–52.0)
Hemoglobin: 8.5 g/dL — ABNORMAL LOW (ref 13.0–17.0)
MCH: 28.3 pg (ref 26.0–34.0)
MCHC: 31.8 g/dL (ref 30.0–36.0)
MCV: 89 fL (ref 78.0–100.0)
PLATELETS: 355 10*3/uL (ref 150–400)
RBC: 3 MIL/uL — ABNORMAL LOW (ref 4.22–5.81)
RDW: 18 % — AB (ref 11.5–15.5)
WBC: 8.9 10*3/uL (ref 4.0–10.5)

## 2017-05-22 LAB — PROTIME-INR
INR: 2.92
Prothrombin Time: 31.1 seconds — ABNORMAL HIGH (ref 11.4–15.2)

## 2017-05-22 LAB — LACTATE DEHYDROGENASE: LDH: 159 U/L (ref 98–192)

## 2017-05-22 MED ORDER — AMLODIPINE BESYLATE 5 MG PO TABS
5.0000 mg | ORAL_TABLET | Freq: Every day | ORAL | Status: DC
  Administered 2017-05-22 – 2017-05-23 (×2): 5 mg via ORAL
  Filled 2017-05-22 (×2): qty 1

## 2017-05-22 MED ORDER — TORSEMIDE 20 MG PO TABS
40.0000 mg | ORAL_TABLET | Freq: Every day | ORAL | Status: DC
  Administered 2017-05-23 – 2017-05-26 (×4): 40 mg via ORAL
  Filled 2017-05-22 (×4): qty 2

## 2017-05-22 MED ORDER — SOTALOL HCL 80 MG PO TABS
80.0000 mg | ORAL_TABLET | Freq: Two times a day (BID) | ORAL | Status: DC
  Administered 2017-05-22 (×2): 80 mg via ORAL
  Filled 2017-05-22 (×2): qty 1

## 2017-05-22 NOTE — Progress Notes (Signed)
Physical Therapy Treatment Patient Details Name: James [name] MRN: [id number] DOB: Apr 05, 1961 Today's Date: 05/22/2017    History of Present Illness  Pt adm with acute on chronic heart failure and underwent Heartware HVAD implant on 6/28. Returned to OR later that day for evacuation of mediastinal hematoma. On 7/4 developed ileus with likely aspiration and intubated 7/4-7/6.   Pt developed Heme + stools with progressive anemia as well as  Kirby FunkVtach 05/02/17.  He underwent EGD with showed suspected AVM 04/26/2017. He was intubated for procedure and extubated 05/08/17 PMH - gout, DMII, HTN, CKD, CAD S/P CABGx6 2012, chronic systolic heart failure and St jude ICD.     PT Comments    Patient more alert today and eager to mobilize. Pt required mod A +2 (+3 for chair follow) for ambulation of 110ft X2 with seated rest break due to fatigue. Pt continues to demonstrate cognitive deficits but seemed improved from yesterday's session. Pt with V tach and HR 126-139 end of session. RN and MD present to care for patient. PT will continue to follow acutely.    Follow Up Recommendations  CIR;Supervision for mobility/OOB     Equipment Recommendations  Rolling walker with 5" wheels;3in1 (PT)    Recommendations for Other Services Rehab consult     Precautions / Restrictions Precautions Precautions: Sternal Precaution Comments: Heartware HVAD Restrictions Weight Bearing Restrictions: Yes Other Position/Activity Restrictions: Sternal precautions    Mobility  Bed Mobility Overal bed mobility: Needs Assistance Bed Mobility: Supine to Sit;Sit to Supine     Supine to sit: Mod assist;+2 for physical assistance Sit to supine: Max assist;+2 for physical assistance   General bed mobility comments: cues for sequencing; pt assisted to bring bilat LE to EOB and to elevate trunk; use of bed pad to scoot hips  Transfers Overall transfer level: Needs assistance Equipment used: Rolling walker (2 wheeled) Transfers:  Sit to/from UGI CorporationStand;Stand Pivot Transfers Sit to Stand: Min assist;Mod assist;From elevated surface;+2 physical assistance Stand pivot transfers: Mod assist;Max assist;+2 physical assistance       General transfer comment: with bed height elevated, pt able to stand x 3 to get standing weight and to stand to ambulate.  He stood x 2 from recliner with mod A +2, requires momentum to forward translation of trunk and assist to lift buttocks from chair   Ambulation/Gait Ambulation/Gait assistance: Mod assist;+2 physical assistance (+3 for chair follow) Ambulation Distance (Feet):  (6720ft with seated rest break) Assistive device: Rolling walker (2 wheeled) Gait Pattern/deviations: Step-through pattern;Decreased step length - right;Decreased step length - left;Trunk flexed Gait velocity: decreased Gait velocity interpretation: Below normal speed for age/gender General Gait Details: multimodal cues for posture and facilitation at glutes for LE advancement at times; assist for RW management; pt required seated break due to fatigue; extra person for chair follow    Stairs            Wheelchair Mobility    Modified Rankin (Stroke Patients Only)       Balance Overall balance assessment: Needs assistance Sitting-balance support: Feet supported;Bilateral upper extremity supported Sitting balance-Leahy Scale: Fair     Standing balance support: Bilateral upper extremity supported Standing balance-Leahy Scale: Poor Standing balance comment: Pt requires bil. UE support                             Cognition Arousal/Alertness: Awake/alert Behavior During Therapy: WFL for tasks assessed/performed;Flat affect Overall Cognitive Status: Impaired/Different from baseline  Problem Solving: Slow processing General Comments: Pt brighter, more interactive, able to anticipate movement and activity better today, but still not completely back to  baseline      Exercises      General Comments General comments (skin integrity, edema, etc.): pt with HR of 126 and with V tach; RN notified and in to assess pt and assist therapists with return to supine; RN notified MD      Pertinent Vitals/Pain Pain Assessment: No/denies pain    Home Living                      Prior Function            PT Goals (current goals can now be found in the care plan section) Acute Rehab PT Goals Patient Stated Goal: to go home Progress towards PT goals: Progressing toward goals    Frequency    Min 3X/week      PT Plan Current plan remains appropriate    Co-evaluation PT/OT/SLP Co-Evaluation/Treatment: Yes Reason for Co-Treatment: Complexity of the patient's impairments (multi-system involvement);For patient/therapist safety PT goals addressed during session: Mobility/safety with mobility;Strengthening/ROM;Balance OT goals addressed during session: ADL's and self-care      AM-PAC PT "6 Clicks" Daily Activity  Outcome Measure  Difficulty turning over in bed (including adjusting bedclothes, sheets and blankets)?: A Lot Difficulty moving from lying on back to sitting on the side of the bed? : Total Difficulty sitting down on and standing up from a chair with arms (e.g., wheelchair, bedside commode, etc,.)?: Total Help needed moving to and from a bed to chair (including a wheelchair)?: A Lot Help needed walking in hospital room?: A Lot Help needed climbing 3-5 steps with a railing? : Total 6 Click Score: 9    End of Session Equipment Utilized During Treatment: Gait belt Activity Tolerance: Patient tolerated treatment well Patient left: with call bell/phone within reach;in bed;with nursing/sitter in room Nurse Communication: Mobility status PT Visit Diagnosis: Unsteadiness on feet (R26.81);Muscle weakness (generalized) (M62.81);Difficulty in walking, not elsewhere classified (R26.2);Other abnormalities of gait and mobility  (R26.89);Pain Pain - Right/Left: Left Pain - part of body: Shoulder     Time: [id number] PT Time Calculation (min) (ACUTE ONLY): 50 min  Charges:  $Gait Training: 8-22 mins $Therapeutic Activity: 8-22 mins                    G Codes:       Erline LevineKellyn Mendel [place], PTA Pager: [phone]     Carolynne EdouardKellyn R Jammi Morrissette 05/22/2017, 3:22 PM

## 2017-05-22 NOTE — Consult Note (Signed)
WOC Nurse wound consult note Reason for Consult: re-consulted for LE wounds, patient with history of tophaceous gout with crystals that exit through wounds in his LE. His right LE currently has a number of open lesions with hard crystals. The only change noted today from my first assessment is that the tissue between the open lesions is macerated. For that reason I will add calcium alginate over the lesions covered with foam to manage the drainage better and prevent maceration.  He is noted today at the time of my assessment to have a Stage 2 Pressure injury on his sacrum that was not present at the time of my previous assessment. Wound type: Tophaceous gout, right LE Stage 2 Pressure injury; sacrum  Pressure Injury POA: No Measurement: Sacrum: 2cm x 3cm x 0.2cm; partial thickness, pale, non granular but not necrotic  RLE: multiple lesions with open pink tissue and white crystals. Wound bed: see above Drainage (amount, consistency, odor) none at the sacral area, minimal-serous anginous from the calf wounds  Periwound:intact, patient continues to have gouty nodules in the LLE calf today Dressing procedure/placement/frequency: Add calcium alginate for absorbency for the LE wounds, cover with foam. Patient on Low air loss mattress for pressure redistribution for the sacrum Foam to protect the sacrum, turn and reposition. Maximize nutrition for wound healing.  WOC Nurse team will follow along with you for weekly wound assessments.  Please notify me of any acute changes in the wounds or any new areas of concerns [facility] Am[facility] MSN, RN,CWOCN, CNS, CWON-AP [phone]

## 2017-05-22 NOTE — Progress Notes (Signed)
Inpatient Diabetes Program Recommendations  AACE/ADA: New Consensus Statement on Inpatient Glycemic Control (2015)  Target Ranges:  Prepandial:   less than 140 mg/dL      Peak postprandial:   less than 180 mg/dL (1-2 hours)      Critically ill patients:  140 - 180 mg/dL   Results for James [name], James [name] (MRN [id number]) as of 05/22/2017 12:19  Ref. Range 05/21/2017 06:11 05/21/2017 12:15 05/21/2017 18:27 05/21/2017 21:23  Glucose-Capillary Latest Ref Range: 65 - 99 mg/dL 409203 (H)  5 units Novolog 167 (H)  3 units Novolog 232 (H)  5 units Novolog 253 (H)   Results for James [name], James [name] (MRN [id number]) as of 05/22/2017 12:19  Ref. Range 05/22/2017 05:59 05/22/2017 11:29  Glucose-Capillary Latest Ref Range: 65 - 99 mg/dL 956141 (H)  2 units Novolog 205 (H)  5 units Novolog    Home DM Meds: Glipizide 5 mg BID  Current Insulin Orders: Novolog Moderate Correction Scale/ SSI (0-15 units) TID AC      MD- Please consider the following in-hospital insulin adjustments while home dose of Glipizide is on hold:  Start low dose Novolog Meal Coverage: Novolog 3 units TID with meals (hold if pt eats <50% of meal)      --Will follow patient during hospitalization--  Ambrose FinlandJeannine Johnston Jaiya Mooradian RN, MSN, CDE Diabetes Coordinator Inpatient Glycemic Control Team Team Pager: [phone] (8a-5p)

## 2017-05-22 NOTE — Care Management Important Message (Signed)
Important Message  Patient Details  Name: James FabianHarry D Stockinger MRN: [id number] Date of Birth: 1961-09-13   Medicare Important Message Given:  Yes    Kyla BalzarineShealy, Aidenjames Heckmann Abena 05/22/2017, 10:36 AM

## 2017-05-22 NOTE — Progress Notes (Signed)
Patient ID: James [name], male   DOB: Jul 25, 1961, 56 y.o.   MRN: [id number]   Advanced Heart Failure VAD Team Note  Subjective:    Events: --HVAD placed 6/28.  Returned to the OR that evening with high chest tube output, evacuation of mediastinal hematoma.  --Extubated 6/29. Milrinone stopped on 7/4. --7/4, patient developed ileus with respiratory compromise. NGT placed with 3 L suctioned out.  CXR with suspicion for aspiration PNA.  Patient had to be intubated.  He went into atrial fibrillation with RVR.  He became hypotensive and was started on norepinephrine and phenylephrine.  Amiodarone gtt begun.    --Extubated again on 7/6.  -- 7/13 Had 2 sustained episodes of VT. Had to be cardioverted 1. Second episode broke with overdrive pacing with Dr. Graciela HusbandsKlein. VAD speed turned down to 2700.  --Melena due to acuteGI bleed --> 2 units PRBCs 7/14, 2 units 7/15. 3 U PRBCs 7/16,  04/25/2017 3 UPRBCs.  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  He had epinephrine, APC, and 2 clips. Intubated prior to procedure and placed on norepinephrine. Speed dropped to 2660.  --7/18 Back in VT with overdrive pacing. Lidocaine was started in addition to amiodarone. Extubated. --7/19 lidocaine stopped with confusion. Neuro consulted.  EEG normal.  CT of head no acute findings. 7/20 confusion had resolved.  --1 unit PRBCs 7/20.  --7/22, developed sustained VT with rate around 130 shortly after getting IV Lasix.  We were able to pace him out and back to NSR.  He was put back on amiodarone gtt.  --More VT on 7/25, paced out.  Back on IV amiodarone gtt.  NSR.  -- 7/31 Ramp ECHO decrease speed 2600 rpm.  --8/1 VT- paced out  Yesterday diuresed with IV lasix. Brisk diuresis noted. CVP down to 9. Also had recurrent VT --> paced out.   Feeling better. Appetite improved. Denies SOB.    HVAD INTERROGATION:  HVAD:  Flow 4.1liters/min, speed 2600,  power 3.8W,  Peak 7.3 Trough  2.3.  Suction On. Lavare On. No alarms.    Objective:    Vital Signs:   Temp:  [97.8 F (36.6 C)-98.2 F (36.8 C)] 98.2 F (36.8 C) (08/02 0400) Pulse Rate:  [68-73] 72 (08/02 0400) Resp:  [17-24] 20 (08/02 0400) BP: (118-127)/(89-96) 127/96 (08/02 0400) SpO2:  [99 %-100 %] 100 % (08/02 0400) Weight:  [240 lb 1.6 oz (108.9 kg)-248 lb 12.8 oz (112.9 kg)] 240 lb 1.6 oz (108.9 kg) (08/02 0500) Last BM Date: 05/21/17 Mean arterial Pressure 90-100s  Intake/Output:   Intake/Output Summary (Last 24 hours) at 05/22/17 0939 Last data filed at 05/22/17 0400  Gross per 24 hour  Intake              700 ml  Output             3525 ml  Net            -2825 ml     Physical Exam      Physical Exam: CVP 9  GENERAL: Well appearing. In bed. NAD.  HEENT: normal  NECK: Supple, JVP 8-9.  2+ bilaterally, no bruits.  No lymphadenopathy or thyromegaly appreciated.   CARDIAC:  Mechanical heart sounds with LVAD hum present.  LUNGS:  Clear to auscultation bilaterally.  ABDOMEN:  Soft, round, nontender, positive bowel sounds x4.     LVAD exit site: Dressing dry and intact.  No erythema or drainage.  Stabilization device  present and accurately applied.  Driveline dressing is being changed daily per sterile technique. EXTREMITIES:  Warm and dry, no cyanosis, clubbing, rash. R and LLE 2+  Edema. RUE pICC NEUROLOGIC:  Alert and oriented x 4.  Gait steady.  No aphasia.  No dysarthria.  Affect pleasant.     Telemetry   Personally reviewed. No VT over night. NSR 70s  Labs   Basic Metabolic Panel:  Recent Labs Lab 05/18/17 0521 05/19/17 0413 05/20/17 0459 05/21/17 0500 05/22/17 0434  NA 134* 136 134* 134* 138  K 3.8 4.5 4.2 3.8 4.3  CL 109 110 108 109 111  CO2 19* 18* 18* 19* 21*  GLUCOSE 92 174* 238* 235* 157*  BUN 38* 39* 47* 49* 50*  CREATININE 1.65* 1.90* 2.04* 2.01* 2.02*  CALCIUM 7.7* 7.9* 7.9* 7.9* 7.9*  MG 1.8 2.0  --  1.8  --     Liver Function Tests: No results for input(s):  AST, ALT, ALKPHOS, BILITOT, PROT, ALBUMIN in the last 168 hours. No results for input(s): LIPASE, AMYLASE in the last 168 hours. No results for input(s): AMMONIA in the last 168 hours.  CBC:  Recent Labs Lab 05/17/17 0427 05/18/17 0521 05/19/17 0413 05/20/17 0459 05/22/17 0434  WBC 4.6 7.1 8.9 6.2 8.9  NEUTROABS  --   --  8.5*  --   --   HGB 8.3* 8.2* 9.2* 8.5* 8.5*  HCT 25.3* 25.6* 29.0* 26.4* 26.7*  MCV 89.7 88.9 89.8 89.2 89.0  PLT 314 348 387 383 355    INR:  Recent Labs Lab 05/18/17 0521 05/19/17 0413 05/20/17 0459 05/21/17 0500 05/22/17 0434  INR 2.53 2.25 2.64 2.83 2.92    Other results:     Imaging   No results found.   Medications:     Scheduled Medications: . amiodarone  400 mg Oral Q8H  . amLODipine  2.5 mg Oral Daily  . febuxostat  120 mg Oral Daily  . feeding supplement (ENSURE ENLIVE)  237 mL Oral TID BM  . hydrALAZINE  100 mg Oral Q8H  . insulin aspart  0-15 Units Subcutaneous TID WC  . isosorbide mononitrate  30 mg Oral Daily  . mexiletine  300 mg Oral Q8H  . mirtazapine  15 mg Oral QHS  . pantoprazole  40 mg Oral BID  . sodium chloride flush  10-40 mL Intracatheter Q12H  . spironolactone  25 mg Oral Daily  . torsemide  60 mg Oral Daily  . Warfarin - Pharmacist Dosing Inpatient   Does not apply q1800    Infusions: . sodium chloride Stopped (05/02/17 0630)  . sodium chloride 10 mL/hr at 05/16/17 1800    PRN Medications: albuterol, docusate, hydrALAZINE, neomycin-bacitracin-polymyxin, ondansetron (ZOFRAN) IV, oxyCODONE, phenol, sodium chloride flush, traMADol   Patient Profile   56 yo with CAD s/p CABG, ischemic cardiomyopathy/chronic systolic CHF, tophaceous gout, and CKD stage 3 was admitted for diuresis and consideration for LVAD placement. S/p HVAD on 6/28  Assessment/Plan:    1. Acute/chronic systolic CHF s/p HVAD: Ischemic cardiomyopathy.  St Jude ICD.  Echo (6/18) with EF 15%, mildly dilated RV with moderately  decreased systolic function.  s/p HVAD placement 6/28 and had to return to OR to evacuate mediastinal hematoma.  He had been weaned off pressors/milrinone, but developed ileus w/ likely aspiration event 7/4, re-intubated, developed afib/RVR requiring norepinephrine. Extubated 7/6.  VAD speed turned down to 2660 with VT. VAD parameters currently look good.  Milrinone stopped 7/17 early am with recurrent VT.  Now off norepinephrine. Todays CO-OX is 70.5%. CVP down from 12>9. No need to IV lasix. Increase torsemide to 60 mg daily.   Renal function stable.     Complicated with RV failure and VT.  Continue spironolactone 12.5 daily was started on 7/29. Continue  . Off ASA with GI Bleed INR trending up 2.9 and has not had coumadin. Hold again today. Goal now is 2-2.5.  No further bleeding. Discussed with Pharm D.  - Has been off dig/arb with elevated creatinine.   - Maps continue to run high. Continue hydralazine to 100 mg three times a day imdur 60 mg daily.  - Continue spironolactone 25 daily.  - Increase amlodipine 5 mg daily.   2. AKI on CKD stage 3: Suspect a component of peri-op ATN, worsened with ileus, vomiting, intubation/sedation. Creatinine stable 2.02.      3. Symptomatic anemia due to acute UGI bleeding:  Received 3 units PRBCs 7/16 and 3 units PRBCs on 05/01/2017. 1 unit PRBCs 7/20. 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  Requiring 2 clips + epi + APC. Does not appear to be bleeding actively now, hgb higher today.  - Continue Protonix 40 po bid. - Back on warfarin, aiming for INR 2-2.5 range.  Will leave off aspirin for now. Dosing per pharm. - Transfuse hgb < 8.  - Hgb stable 8.5. CBC in am  - Got Feraheme 7/28.   4. Ventricular tachycardia: Status post ventricular tachycardia 2 on 7/13. Possible suction event. VAD speed turned down to 2700.  Recurrent VT 7/17. EP called to bedside. Overdrive pacing successful for about 10 minutes but VT recurred.   Milrinone turned off, remained in VT overnight but hemodynamically stable.  Speed decreased to 2660.  On 7/18, we were able to pace him out of VT.  Lidocaine added then stopped due to confusion/mental status changes.  He had VT 7/22 about 30 minutes after getting IV Lasix, had to be paced out again => may have been due to rapid fluid shift.  Pt paced out of VT 3 separate occasions 7/25. Got amio bolus x 2 and started back on IV infusion.  Transitioned to amiodarone 400 mg tid.   On 8/1 had recurrent VT and paced out again.    - Off lidocaine with confusion.  -Continue mexiletine up to 300 mg every 8 hrs and amio 400 TID per EP.   5. CAD: s/p CABG 2012. No S/S ischemia.  Off aspirin due to GI bleeding. No change.   6. Gout: Severe tophaceous gout. Left shoulder pain ?gout.   - Completed 3 days of prednisone.   - Continue uloric.  7. Atrial fibrillation: Developed atrial fibrillation with RVR in setting of aspiration PNA on 7/4. - Continue amiodarone 400 tid. Continue NSR   8. Malnutrition: Now off TPN. Per Speech- FEEs performed. Prealbumin very low.  Nutrition recommendations appreciated.  Continue supplements. Appetite improving.  9. Aspiration PNA with acute hypoxemic respiratory failure on 7/4: He was extubated on 7/6. Tracheal aspirate with Klebsiella and citrobacter. Both sensitive to Zosyn. Completed abx 7/13. No s/s active infection 10. Ileus: Resolved. Getting Reglan. No change. 11. Deconditioning:  Plan for CIR when stable. OT/PT appreciated.   12. Acute Respiratory Failure: Intubated 7/17 in setting of complex GI intervention. Extubated 7/18. On 4 liters sats stable. No change. 13. Delirium: Resolved. No further. Appreciate neuro input Possibly related to lidocaine.  Lidocaine stopped. Confusion resolved. CT of head negative. EEG ok.  No further workup per neuro. No change today.  15. Insomnia: On remeron for sleep and depression. 16. Post-op depression: Remeron started 7/28.   I  reviewed the HVAD parameters from today, and compared the results to the patient's prior recorded data.  No programming changes were made.  The HVAD is functioning within specified parameters.    CIR next week. Discussed at length with rehab case manager. Will need another CIR consult on Monday.    Tonye BecketAmy Clegg, NP 05/22/2017, 9:39 AM  VAD Team --- VAD ISSUES ONLY--- Pager [phone] (7am - 7am) Advanced Heart Failure Team  Pager [phone] (M-F; 7a - 4p)  Please contact [facility] for night-coverage after hours (4p -7a ) and weekends on [email or web address]  Patient seen with NP, agree with the above note.    He had another run of VT this afternoon after walking with PT.  Did not notice, HR in 120s.  He was paced out.  Had suction event during transition between VT and NSR.  He diuresed well yesterday with IV Lasix with weight down considerably.  Suspect decrease in size of LV with rapid diuresis causing some impingement on septum when he changed positions to walk may have triggered VT.   - Will need to avoid rapid fluid shifts.  CVP 9 today, decrease torsemide to 40 mg daily.  - Discussed with Dr. Elberta Fortisamnitz, will try transitioning amiodarone to sotalol to see if more effective.   Renal function and hemoglobin stable.  Continue to work with PT.  Reconsult CIR on Monday.   Marca AnconaDalton Raymel Cull 05/22/2017 1:48 PM

## 2017-05-22 NOTE — Progress Notes (Signed)
Left abdominal VAD dressing removed and site care performed using sterile technique. Drive line exit site cleaned with Chlora prep applicators x 2, allowed to dry, and gauze dressing with aquacel silver strip re-applied. Exit site with minimal tissue ingrowth noted, proximal stitch removed today, the velour is fully implanted at exit site.    Called PT to ask if they can work with Mr. [name] 2x daily. PT/OT will work together to see that the pt gets some type of therapy every day.   Sent consult to wound care for worsening RLE wound due to gout.   Carlton AdamSarah Herbert RN, BSN VAD Coordinator 24/7 pager [phone]

## 2017-05-22 NOTE — Progress Notes (Signed)
Occupational Therapy Treatment Patient Details Name: James [name] MRN: [id number] DOB: Mar 11, 1961 Today's Date: 05/22/2017    History of present illness  Pt adm with acute on chronic heart failure and underwent Heartware HVAD implant on 6/28. Returned to OR later that day for evacuation of mediastinal hematoma. On 7/4 developed ileus with likely aspiration and intubated 7/4-7/6.   Pt developed Heme + stools with progressive anemia as well as  Kirby FunkVtach 05/02/17.  He underwent EGD with showed suspected AVM 05/01/2017. He was intubated for procedure and extubated 05/08/17 PMH - gout, DMII, HTN, CKD, CAD S/P CABGx6 2012, chronic systolic heart failure and St jude ICD.    OT comments  Pt seen with PT.  Pt brighter today, but still not fully back to baseline cognitively - mildly delayed processing.  He stood x 3 with min A with bed height elevated.  He was able to ambulate x 10 ft with mod A +2 took seated rest break, then ambulated 3110ft more.  He demonstrates heavy Lt lateral lean today which is worse than normal and had difficulty fully extending hip and trunk.  Pt with onset of V-tach HR 123-139 at end of session.    Follow Up Recommendations  CIR;Supervision/Assistance - 24 hour    Equipment Recommendations  3 in 1 bedside commode    Recommendations for Other Services Rehab consult    Precautions / Restrictions Precautions Precautions: Sternal Precaution Comments: Heartware HVAD Restrictions Weight Bearing Restrictions: Yes Other Position/Activity Restrictions: Sternal precautions       Mobility Bed Mobility Overal bed mobility: Needs Assistance Bed Mobility: Supine to Sit;Sit to Supine     Supine to sit: Mod assist;+2 for physical assistance Sit to supine: Max assist;+2 for physical assistance   General bed mobility comments: Pt able to assist more with moving LEs to EOB an dwith lifitng trunk.  Assisted pt with all aspects of return to bed   Transfers Overall transfer level: Needs  assistance Equipment used: Rolling walker (2 wheeled) Transfers: Sit to/from [employer]GI CorporationStand;Stand Pivot Transfers Sit to Stand: Min assist;Mod assist;From elevated surface;+2 physical assistance Stand pivot transfers: Mod assist;Max assist;+2 physical assistance       General transfer comment: with bed height elevated, pt able to stand x 3 to get standing weight and to stand to ambulate.  He stood x 2 from recliner with mod A +2, requires momentum to forward translation of trunk and assist to lift buttocks from chair     Balance Overall balance assessment: Needs assistance Sitting-balance support: Feet supported;Bilateral upper extremity supported Sitting balance-Leahy Scale: Fair     Standing balance support: Bilateral upper extremity supported Standing balance-Leahy Scale: Poor Standing balance comment: Pt requires bil. UE support                            ADL either performed or assessed with clinical judgement   ADL Overall ADL's : Needs assistance/impaired                         Toilet Transfer: Moderate assistance;+2 for physical assistance;Squat-pivot;BSC;RW Toilet Transfer Details (indicate cue type and reason): heavy lean to the Left.  Requires cues to stand upright          Functional mobility during ADLs: Moderate assistance;+2 for physical assistance;Rolling walker       Vision       Perception     Praxis  Cognition Arousal/Alertness: Awake/alert Behavior During Therapy: WFL for tasks assessed/performed;Flat affect Overall Cognitive Status: Impaired/Different from baseline                               Problem Solving: Slow processing General Comments: Pt brighter, more interactive, able to anticipate movement and activity better today, but still not completely back to baseline        Exercises     Shoulder Instructions       General Comments Pt fatigued after ambulation and moved to sitting.  Pt noted with HR 126  with V-tach per monitor.  RN notified and in to assist pt back to bed.  RN notified MD     Pertinent Vitals/ Pain       Pain Assessment: No/denies pain  Home Living                                          Prior Functioning/Environment              Frequency  Min 3X/week        Progress Toward Goals  OT Goals(current goals can now be found in the care plan section)  Progress towards OT goals: Progressing toward goals (slowly.  Goals date updated )  Acute Rehab OT Goals Patient Stated Goal: to go home OT Goal Formulation: With patient Time For Goal Achievement: 06/05/17 Potential to Achieve Goals: Good  Plan Discharge plan remains appropriate    Co-evaluation    PT/OT/SLP Co-Evaluation/Treatment: Yes Reason for Co-Treatment: Complexity of the patient's impairments (multi-system involvement);For patient/therapist safety   OT goals addressed during session: ADL's and self-care      AM-PAC PT "6 Clicks" Daily Activity     Outcome Measure   Help from another person eating meals?: A Little Help from another person taking care of personal grooming?: A Little Help from another person toileting, which includes using toliet, bedpan, or urinal?: A Lot Help from another person bathing (including washing, rinsing, drying)?: A Lot Help from another person to put on and taking off regular upper body clothing?: A Lot Help from another person to put on and taking off regular lower body clothing?: Total 6 Click Score: 13    End of Session    OT Visit Diagnosis: Muscle weakness (generalized) (M62.81)   Activity Tolerance Treatment limited secondary to medical complications (Comment) (V-tach )   Patient Left in bed;with call bell/phone within reach;with nursing/sitter in room   Nurse Communication Mobility status        Time: [id number] OT Time Calculation (min): 52 min  Charges: OT General Charges $OT Visit: 1 Procedure OT  Treatments $Therapeutic Activity: 8-22 mins  [name], OTR/L [phone]    [name], [name] 05/22/2017, 2:04 PM

## 2017-05-22 NOTE — Progress Notes (Signed)
LVAD Inpatient Coordinator Rounding Note:  Admitted 04/14/2017 due to A/C Heart failure.   HeartWare LVAD implanted on 03/24/2017 by Dr. Laneta SimmersBartle as DT VAD.  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 He had epinephrine, APC, and 2 clips.   Vital signs: HR: 77 Doppler:  100 Auto BP:  not done O2 Sat: 100% on RA Wt:207>209 > 213 >213 > 208>203>220>227lbs...........234>233>240>240>236>239>238>240>241>249>248>240  LVAD interrogation reveals:  Speed: 2600 Flow: 4.5 Power: 3.8w Alarms:none Peak: 7.1 Trough: 2.8 HCT: 26 Low flow alarm setting: 2.5 High watt alarm setting: 6   Suction: on  Lavare cycle: on  Blood Products: 6/28> 5 PRBC's, 6 FFP 7/1> 1 PRBC 7/9> 1 PRBC 7/13>3 PRBC 7/14>3 PRBC 7/15> 1 PRBC 7/16>3PRBC 7/17> 3 PRBC 7/20> 1 PRBC  Gtts: Milrinone restarted 7/8, stopped 7/16 Levo stopped 04/26/17 restarted 05/17/2017 - stopped 05/07/17 Amiodarone stopped 04/29/17, restarted 7/17 - transitioned to PO 05/13/17; VT on 05/15/17 - IV amio re-started - 30 mg/hr stopped 05/16/17 Heparin stopped 04/29/17 Lidocaine 05/07/17 - stopped 7/19  TPN - started 04/24/17 for nutritional support, stopped 04/30/17  Arrhythmia: 04/24/17 - Afib with RVR - started amiodarone 05/02/17 - 2 sustained episodes of VT - cardioverted x 1; second broke with overdrive pacing 7/17- wide complex tachycardia- Amio bolus x3 and gtt at 60 mg/hr 05/07/17 - sustained VT - converted with overdrive pacing; Lidocaine started in addition to amiodarone 05/08/17 - Lidocaine stopped due to confusion (lido level 12.4) 05/14/17 - sustained VT - converted 3 separate occassions with overdrive pacing 05/19/17 - sustained VT-converted with overdrive pacing   Respiratory: 04/23/17 - re-intubated due to respiratory failure secondary to suspected aspiration pneumonia 04/25/17-extubated 04/25/2017- intubated for EGD 05/17/2017- extubated  Drive Line: Daily Dressing Kits  with Aquacell AG silver strips per protocol. VAD Coordinator will change dressing today.   Labs:  LDH trend:160 (pre VAD)>227>215......200>183>161>164>184>195>190>188>187>205>212>185>167>167>199>165>151>159  INR trend: 1.31>1.45>1.44>2.09>2.28>3.58>3.94>3.22>2.17>1.74>1.7>2.24>2.68>2.33>2.15>2.25>2.11>2.13>2.45>2.25>2.64>2.83>2.92  Anticoagulation Plan: -INR Goal: 2-2.5  -ASA Dose: 325 mg- on hold  Adverse Events on VAD: [facility] - 04/23/17 Ileus with vomiting; re-intubation for acute respiratory failure; probable aspiration pneumonia - 7/13-GI bleed - 7/19 - Lidocaine gtt stopped due to confusion (lido toxicity)    Plan/Recommendations:  1. Continue daily drive line dressing changes with patient's wife.  2. Please call VAD pager with patient and equipment concerns.. 3. [facility] 4. Lollie SailsHarry [facility] to complete VAD test on Saturday. [name] will call VAD coord to arrange a time to review test together before discharge to CIR.     Carlton AdamSarah Herbert RN, VAD Coordinator 24/7 pager [phone]

## 2017-05-22 NOTE — Progress Notes (Signed)
ANTICOAGULATION CONSULT NOTE - Follow Up Consult  Pharmacy Consult for Warfarin  Indication: HVAD  No Active Allergies  Patient Measurements: Height: 6' (182.9 cm) Weight: 240 lb 1.6 oz (108.9 kg) (with VAD) IBW/kg (Calculated) : 77.6  Vital Signs: Temp: 98.2 F (36.8 C) (08/02 0400) Temp Source: Oral (08/02 0400) BP: 127/96 (08/02 0400) Pulse Rate: 72 (08/02 0400)  Labs:  Recent Labs  05/20/17 0459 05/21/17 0500 05/22/17 0434  HGB 8.5*  --  8.5*  HCT 26.4*  --  26.7*  PLT 383  --  355  LABPROT 28.7* 30.3* 31.1*  INR 2.64 2.83 2.92  CREATININE 2.04* 2.01* 2.02*    Estimated Creatinine Clearance: 53.3 mL/min (A) (by C-G formula based on SCr of 2.02 mg/dL (H)).   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 He developed an ileus on 7/5, warfarin was held, and heparin bridge started once INR fell below 1.8.  Heparin stopped 7/10 with INR 2.19. On 7/13, Hgb dropped and he had melena - warfarin held again and bridged with heparin while INR < 1.8.  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   Warfarin resumed 7/18 without heparin bridge. INR  trend up 2.8 today slightly.  CBC stable today. Diet has been advanced, and is also receiving supplements.  Remains on Amiodarone drip> po> back on drip post iv boluses 7/25 for VT > po increased dose 400mg  TID and increased mexilitine dose.  Strong suspicion that amiodarone is contributing to elevated INR.  Goal of Therapy:  INR 2-2.5 Monitor platelets by anticoagulation protocol: Yes   Plan:  1) Hold warfarin dose again tonight - hopefully INR will start to fall a bit tomorrow. 2) Daily PT/INR.  Tad MooreJessica Camira Geidel, Pharm D, BCPS  Clinical Pharmacist Pager [phone]  05/22/2017 9:59 AM

## 2017-05-23 DIAGNOSIS — I471 Supraventricular tachycardia: Secondary | ICD-10-CM

## 2017-05-23 LAB — GLUCOSE, CAPILLARY
GLUCOSE-CAPILLARY: 143 mg/dL — AB (ref 65–99)
GLUCOSE-CAPILLARY: 149 mg/dL — AB (ref 65–99)
Glucose-Capillary: 138 mg/dL — ABNORMAL HIGH (ref 65–99)
Glucose-Capillary: 213 mg/dL — ABNORMAL HIGH (ref 65–99)

## 2017-05-23 LAB — BASIC METABOLIC PANEL
ANION GAP: 6 (ref 5–15)
BUN: 47 mg/dL — AB (ref 6–20)
CALCIUM: 7.9 mg/dL — AB (ref 8.9–10.3)
CO2: 23 mmol/L (ref 22–32)
Chloride: 109 mmol/L (ref 101–111)
Creatinine, Ser: 1.78 mg/dL — ABNORMAL HIGH (ref 0.61–1.24)
GFR calc Af Amer: 48 mL/min — ABNORMAL LOW (ref >60)
GFR, EST NON AFRICAN AMERICAN: 42 mL/min — AB (ref >60)
Glucose, Bld: 157 mg/dL — ABNORMAL HIGH (ref 65–99)
POTASSIUM: 4.1 mmol/L (ref 3.5–5.1)
SODIUM: 138 mmol/L (ref 135–145)

## 2017-05-23 LAB — COOXEMETRY PANEL
CARBOXYHEMOGLOBIN: 1.8 % — AB (ref 0.5–1.5)
Methemoglobin: 0.9 % (ref 0.0–1.5)
O2 SAT: 65.9 %
Total hemoglobin: 8.9 g/dL — ABNORMAL LOW (ref 12.0–16.0)

## 2017-05-23 LAB — CBC
HCT: 27.1 % — ABNORMAL LOW (ref 39.0–52.0)
Hemoglobin: 8.8 g/dL — ABNORMAL LOW (ref 13.0–17.0)
MCH: 29.1 pg (ref 26.0–34.0)
MCHC: 32.5 g/dL (ref 30.0–36.0)
MCV: 89.7 fL (ref 78.0–100.0)
PLATELETS: 398 10*3/uL (ref 150–400)
RBC: 3.02 MIL/uL — AB (ref 4.22–5.81)
RDW: 18 % — ABNORMAL HIGH (ref 11.5–15.5)
WBC: 9.7 10*3/uL (ref 4.0–10.5)

## 2017-05-23 LAB — LACTATE DEHYDROGENASE: LDH: 163 U/L (ref 98–192)

## 2017-05-23 LAB — PROTIME-INR
INR: 2.48
Prothrombin Time: 27.3 seconds — ABNORMAL HIGH (ref 11.4–15.2)

## 2017-05-23 LAB — MAGNESIUM: Magnesium: 1.7 mg/dL (ref 1.7–2.4)

## 2017-05-23 MED ORDER — AMLODIPINE BESYLATE 5 MG PO TABS: 5.0000 mg | ORAL_TABLET | Freq: Every day | ORAL | Status: DC

## 2017-05-23 MED ORDER — AMIODARONE HCL 200 MG PO TABS
400.0000 mg | ORAL_TABLET | Freq: Three times a day (TID) | ORAL | Status: DC
  Administered 2017-05-23 – 2017-05-24 (×3): 400 mg via ORAL
  Filled 2017-05-23 (×3): qty 2

## 2017-05-23 MED ORDER — AMLODIPINE BESYLATE 5 MG PO TABS
5.0000 mg | ORAL_TABLET | Freq: Once | ORAL | Status: AC
  Administered 2017-05-23: 5 mg via ORAL
  Filled 2017-05-23: qty 1

## 2017-05-23 MED ORDER — AMLODIPINE BESYLATE 10 MG PO TABS
10.0000 mg | ORAL_TABLET | Freq: Every day | ORAL | Status: DC
  Administered 2017-05-24 – 2017-05-28 (×5): 10 mg via ORAL
  Filled 2017-05-23 (×5): qty 1

## 2017-05-23 MED ORDER — SOTALOL HCL 80 MG PO TABS
80.0000 mg | ORAL_TABLET | Freq: Every day | ORAL | Status: DC
  Administered 2017-05-24 – 2017-05-29 (×6): 80 mg via ORAL
  Filled 2017-05-23 (×7): qty 1

## 2017-05-23 MED ORDER — WARFARIN SODIUM 1 MG PO TABS
1.0000 mg | ORAL_TABLET | Freq: Once | ORAL | Status: AC
  Administered 2017-05-23: 1 mg via ORAL
  Filled 2017-05-23: qty 1

## 2017-05-23 NOTE — Progress Notes (Signed)
   05/23/17 1701  Clinical Encounter Type  Visited With Patient  Visit Type Follow-up;Psychological support;Spiritual support;Social support  Referral From Chaplain  Consult/Referral To Chaplain  Recommendations (Continued follow-up)  Spiritual Encounters  Spiritual Needs Prayer

## 2017-05-23 NOTE — Progress Notes (Signed)
LVAD Inpatient Coordinator Rounding Note:  Admitted 03/21/2017 due to A/C Heart failure.   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 He had epinephrine, APC, and 2 clips.   Vital signs: HR: 63 Doppler:  96 Auto BP:  111/91 O2 Sat: 100% on RA Wt:207>209 > 213 >213 > 208>203>220>227lbs...........161>096>045>409>811>914>782>956>213>086>578>469>629234>233>240>240>236>239>238>240>241>249>248>240>230  LVAD interrogation reveals:  Speed: 2600 Flow: 4.1 Power: 3.7w Alarms:none Peak: 7.1 Trough: 2.8 HCT: 27 Low flow alarm setting: 2.5 High watt alarm setting: 6   Suction: on  Lavare cycle: on  Blood Products: 6/28> 5 PRBC's, 6 FFP 7/1> 1 PRBC 7/9> 1 PRBC 7/13>3 PRBC 7/14>3 PRBC 7/15> 1 PRBC 7/16>3PRBC 7/17> 3 PRBC 7/20> 1 PRBC  Gtts: Milrinone restarted 7/8, stopped 7/16 Levo stopped 04/26/17 restarted 05/03/2017 - stopped 05/07/17 Amiodarone stopped 04/29/17, restarted 7/17 - transitioned to PO 05/13/17; VT on 05/15/17 - IV amio re-started - 30 mg/hr stopped 05/16/17 Heparin stopped 04/29/17 Lidocaine 05/07/17 - stopped 7/19  TPN - started 04/24/17 for nutritional support, stopped 04/30/17  Arrhythmia: 04/24/17 - Afib with RVR - started amiodarone 05/02/17 - 2 sustained episodes of VT - cardioverted x 1; second broke with overdrive pacing 7/17- wide complex tachycardia- Amio bolus x3 and gtt at 60 mg/hr 05/07/17 - sustained VT - converted with overdrive pacing; Lidocaine started in addition to amiodarone 05/08/17 - Lidocaine stopped due to confusion (lido level 12.4) 05/14/17 - sustained VT - converted 3 separate occassions with overdrive pacing 05/19/17 - sustained VT-converted with overdrive pacing   Respiratory: 04/23/17 - re-intubated due to respiratory failure secondary to suspected aspiration pneumonia 04/25/17-extubated 05/11/2017- intubated for EGD 05/01/2017- extubated  Drive Line: Daily Dressing  Kits with Aquacell AG silver strips per protocol.   Labs:  LDH trend:160 (pre VAD)>227>215......528>413>244>010>272>536>644>034>742>595>638>756>433>295>188>416>606>301>601200>183>161>164>184>195>190>188>187>205>212>185>167>167>199>165>151>159>163  INR trend: 1.31>1.45>1.44>2.09>2.28>3.58>3.94>3.22>2.17>1.74>1.7>2.24>2.68>2.33>2.15>2.25>2.11>2.13>2.45>2.25>2.64>2.83>2.92>2.48  Anticoagulation Plan: -INR Goal: 2-2.5  -ASA Dose: 325 mg- on hold  Adverse Events on VAD: [facility] - 04/23/17 Ileus with vomiting; re-intubation for acute respiratory failure; probable aspiration pneumonia - 7/13-GI bleed - 7/19 - Lidocaine gtt stopped due to confusion (lido toxicity)    Plan/Recommendations:  1. Continue daily drive line dressing changes with patient's wife.  2. Please call VAD pager with patient and equipment concerns.. 3. [facility]. 4. [facility]. [name] will call VAD coord to arrange a time to review test together before discharge to CIR.   5. PT/OT to work with the pt daily. 6. Please try to mobilize pt as much as possible.   Carlton AdamSarah Herbert RN, VAD Coordinator 24/7 pager [phone]

## 2017-05-23 NOTE — Progress Notes (Signed)
Occupational Therapy Treatment Patient Details Name: James [name] MRN: [id number] DOB: 1960-11-28 Today's Date: 05/23/2017    History of present illness  Pt adm with acute on chronic heart failure and underwent Heartware HVAD implant on 6/28. Returned to OR later that day for evacuation of mediastinal hematoma. On 7/4 developed ileus with likely aspiration and intubated 7/4-7/6.   Pt developed Heme + stools with progressive anemia as well as  Kirby FunkVtach 05/02/17.  He underwent EGD with showed suspected AVM 05/18/2017. He was intubated for procedure and extubated 05/08/17 PMH - gout, DMII, HTN, CKD, CAD S/P CABGx6 2012, chronic systolic heart failure and St jude ICD.    OT comments  Pt seen with PT.  He was able to perform functional transfers with mod A +2, and ambulated ~1980ft with min A +2, and 3rd person following closely with Recliner.  He required one seated rest break.  HR 39-68.  His affect was flat this am and continues with delayed processing.    Follow Up Recommendations  CIR;Supervision/Assistance - 24 hour    Equipment Recommendations  3 in 1 bedside commode    Recommendations for Other Services Rehab consult    Precautions / Restrictions Precautions Precautions: Sternal Precaution Comments: Heartware HVAD Restrictions Weight Bearing Restrictions: Yes Other Position/Activity Restrictions: Sternal precautions       Mobility Bed Mobility Overal bed mobility: Needs Assistance Bed Mobility: Supine to Sit     Supine to sit: Mod assist;+2 for physical assistance     General bed mobility comments: assist to move LEs off EOB and to lift trunk from bed   Transfers Overall transfer level: Needs assistance Equipment used:  (EVA walker ) Transfers: Sit to/from UGI CorporationStand;Stand Pivot Transfers Sit to Stand: Mod assist;+2 physical assistance Stand pivot transfers: Min assist;Mod assist;+2 physical assistance       General transfer comment: requires mod A +2 to lift buttocks, and assist  for balance and transfers initially, but progressed to min A +2 to steady/balance     Balance Overall balance assessment: Needs assistance Sitting-balance support: Feet supported;Bilateral upper extremity supported Sitting balance-Leahy Scale: Fair     Standing balance support: Bilateral upper extremity supported Standing balance-Leahy Scale: Poor Standing balance comment: requires min A +2 and bil. UE to maintain static standing                            ADL either performed or assessed with clinical judgement   ADL                           Toilet Transfer: Moderate assistance;+2 for physical assistance;BSC Toilet Transfer Details (indicate cue type and reason): requires mod A +2 to lift buttocks to move into standing and assist for balance   Toileting- Clothing Manipulation and Hygiene: Total assistance;Sit to/from stand       Functional mobility during ADLs: Moderate assistance;+2 for physical assistance       Vision       Perception     Praxis      Cognition Arousal/Alertness: Awake/alert Behavior During Therapy: WFL for tasks assessed/performed;Flat affect Overall Cognitive Status: Impaired/Different from baseline                     Current Attention Level: Selective;Sustained   Following Commands: Follows one step commands with increased time     Problem Solving: Slow processing  Exercises     Shoulder Instructions       General Comments HR 39-68 throughout session.  RN aware     Pertinent Vitals/ Pain       Pain Assessment: No/denies pain  Home Living                                          Prior Functioning/Environment              Frequency  Min 3X/week        Progress Toward Goals  OT Goals(current goals can now be found in the care plan section)  Progress towards OT goals: Progressing toward goals     Plan Discharge plan remains appropriate     Co-evaluation    PT/OT/SLP Co-Evaluation/Treatment: Yes Reason for Co-Treatment: Complexity of the patient's impairments (multi-system involvement);For patient/therapist safety;To address functional/ADL transfers   OT goals addressed during session: ADL's and self-care      AM-PAC PT "6 Clicks" Daily Activity     Outcome Measure   Help from another person eating meals?: A Little Help from another person taking care of personal grooming?: A Little Help from another person toileting, which includes using toliet, bedpan, or urinal?: A Lot Help from another person bathing (including washing, rinsing, drying)?: A Lot Help from another person to put on and taking off regular upper body clothing?: A Lot Help from another person to put on and taking off regular lower body clothing?: Total 6 Click Score: 13    End of Session Equipment Utilized During Treatment: Other (comment);Gait belt (EVA walker )  OT Visit Diagnosis: Muscle weakness (generalized) (M62.81)   Activity Tolerance Patient tolerated treatment well   Patient Left in chair;with call bell/phone within reach;with nursing/sitter in room   Nurse Communication Mobility status        Time: [id number] OT Time Calculation (min): 58 min  Charges: OT General Charges $OT Visit: 1 Procedure OT Treatments $Self Care/Home Management : 8-22 mins $Therapeutic Activity: 8-22 mins  Reynolds AmericanWendi Leenah Seidner, OTR/L [phone]    [name], Brayten Komar M 05/23/2017, 3:16 PM

## 2017-05-23 NOTE — Progress Notes (Signed)
Occupational Therapy Progress Note:  Pt has had increased difficulty with self feeding.  Worked with pt on self feeding using Adapted equipment and modifications to room equipment.  With these in place, he was able to feed self with min A.    Recommend CIR.    05/23/17 1525  OT Visit Information  Last OT Received On 05/23/17  Assistance Needed +2  History of Present Illness Pt adm with acute on chronic heart failure and underwent Heartware HVAD implant on 6/28. Returned to OR later that day for evacuation of mediastinal hematoma. On 7/4 developed ileus with likely aspiration and intubated 7/4-7/6.   Pt developed Heme + stools with progressive anemia as well as  Kirby FunkVtach 05/02/17.  He underwent EGD with showed suspected AVM 04/30/2017. He was intubated for procedure and extubated 05/08/17 PMH - gout, DMII, HTN, CKD, CAD S/P CABGx6 2012, chronic systolic heart failure and St jude ICD.   Precautions  Precautions Sternal  Precaution Comments Heartware HVAD  Pain Assessment  Pain Assessment No/denies pain  Cognition  Arousal/Alertness Awake/alert  Behavior During Therapy WFL for tasks assessed/performed;Flat affect  Overall Cognitive Status Impaired/Different from baseline  Current Attention Level Selective;Sustained  Following Commands Follows one step commands with increased time  Problem Solving Slow processing  ADL  Overall ADL's  Needs assistance/impaired  Eating/Feeding Minimal assistance  Eating/Feeding Details (indicate cue type and reason) Pt with increasing difficulty feeding self.   Worked with pt on self feeding.  He was provided with curved utensils with built up handles and tray table switched out for one that is able to be adjusted to lower level.  With these adaptations, he was able to feed self with min A - has difficulty due to shoulder weakness and weakness with supination   General Comments  General comments (skin integrity, edema, etc.) HR in 50s  OT - End of Session  Activity  Tolerance Patient tolerated treatment well  Patient left in chair;with call bell/phone within reach;with nursing/sitter in room  Nurse Communication Mobility status  OT Assessment/Plan  OT Plan Discharge plan remains appropriate  OT Visit Diagnosis Muscle weakness (generalized) (M62.81)  OT Frequency (ACUTE ONLY) Min 3X/week  Recommendations for Other Services Rehab consult  Follow Up Recommendations CIR;Supervision/Assistance - 24 hour  OT Equipment 3 in 1 bedside commode  AM-PAC OT "6 Clicks" Daily Activity Outcome Measure  Help from another person eating meals? 3  Help from another person taking care of personal grooming? 3  Help from another person toileting, which includes using toliet, bedpan, or urinal? 2  Help from another person bathing (including washing, rinsing, drying)? 2  Help from another person to put on and taking off regular upper body clothing? 2  Help from another person to put on and taking off regular lower body clothing? 1  6 Click Score 13  ADL G Code Conversion CL  OT Goal Progression  Progress towards OT goals Progressing toward goals  OT Time Calculation  OT Start Time (ACUTE ONLY) 1230  OT Stop Time (ACUTE ONLY) 1309  OT Time Calculation (min) 39 min  OT General Charges  $OT Visit 1 Procedure  OT Treatments  $Self Care/Home Management  38-52 mins  Reyn[employer] AmericanWendi Tarquin Welcher, OTR/L [phone]

## 2017-05-23 NOTE — Progress Notes (Signed)
Inpatient Rehabilitation  Continuing to follow along at a distance for timing of medical readiness, insurance authorization, and bed availability.  Plan to follow up with team on Monday.  Please call with questions.   Charlane FerrettiMelissa Favor Hackler, M.A., CCC/SLP Admission Coordinator  [facility]  Cell [phone]

## 2017-05-23 NOTE — Progress Notes (Signed)
Physical Therapy Treatment Patient Details Name: James FabianHarry D Vasco MRN: [id number] DOB: 1960/10/23 Today's Date: 05/23/2017    History of Present Illness  Pt adm with acute on chronic heart failure and underwent Heartware HVAD implant on 6/28. Returned to OR later that day for evacuation of mediastinal hematoma. On 7/4 developed ileus with likely aspiration and intubated 7/4-7/6.   Pt developed Heme + stools with progressive anemia as well as  Kirby FunkVtach 05/02/17.  He underwent EGD with showed suspected AVM 04/20/2017. He was intubated for procedure and extubated 05/08/17 PMH - gout, DMII, HTN, CKD, CAD S/P CABGx6 2012, chronic systolic heart failure and St jude ICD.     PT Comments    Patient tolerated increased gait distance with mod A +2 and third person for close chair follow. Pt required seated break due to fatigue. HR grossly in 50s with range of 39-68 bpm.  Pt continues to require +2 for all mobility. Continue to progress as tolerated.   Follow Up Recommendations  CIR;Supervision for mobility/OOB     Equipment Recommendations  Other (comment) (TBD next venue)    Recommendations for Other Services       Precautions / Restrictions Precautions Precautions: Sternal Precaution Comments: Heartware HVAD Restrictions Weight Bearing Restrictions: Yes Other Position/Activity Restrictions: Sternal precautions    Mobility  Bed Mobility Overal bed mobility: Needs Assistance Bed Mobility: Supine to Sit     Supine to sit: Mod assist;+2 for physical assistance     General bed mobility comments: cues for sequencing; assist at bilat LE and to elevate trunk into sitting; use of bed pad to position hips  Transfers Overall transfer level: Needs assistance Equipment used:  (EVA walker) Transfers: Sit to/from UGI CorporationStand;Stand Pivot Transfers Sit to Stand: Mod assist;+2 physical assistance Stand pivot transfers: Min assist;Mod assist;+2 physical assistance       General transfer comment: +2 to power up  into standing with use of chuck pad to lift buttocks from surface; cues for hand placement and technique; pt uses momentum   Ambulation/Gait Ambulation/Gait assistance: Mod assist;Min assist;+2 physical assistance Ambulation Distance (Feet): 180 Feet Assistive device:  (EVA walker) Gait Pattern/deviations: Step-through pattern;Decreased step length - right;Decreased step length - left;Decreased dorsiflexion - right;Decreased dorsiflexion - left;Trunk flexed Gait velocity: decreased   General Gait Details: multimodal cues for posture; pt with improved posture with use of EVA walker but when fatigued becomes more flexed at bilat knees and trunk; seated rest break required; pt with more difficulty advancing L LE vs R LE and needs more frequent cues for positioning of L LE   Stairs            Wheelchair Mobility    Modified Rankin (Stroke Patients Only)       Balance Overall balance assessment: Needs assistance Sitting-balance support: Feet supported;Bilateral upper extremity supported Sitting balance-Leahy Scale: Fair     Standing balance support: Bilateral upper extremity supported Standing balance-Leahy Scale: Poor Standing balance comment: requires min A +2 and bil. UE to maintain static standing                             Cognition Arousal/Alertness: Awake/alert Behavior During Therapy: WFL for tasks assessed/performed;Flat affect Overall Cognitive Status: Impaired/Different from baseline                     Current Attention Level: Selective;Sustained   Following Commands: Follows one step commands with increased time  Problem Solving: Slow processing        Exercises      General Comments General comments (skin integrity, edema, etc.): HR in 50s      Pertinent Vitals/Pain Pain Assessment: No/denies pain    Home Living                      Prior Function            PT Goals (current goals can now be found in the  care plan section) Progress towards PT goals: Progressing toward goals    Frequency    Min 3X/week      PT Plan Current plan remains appropriate    Co-evaluation PT/OT/SLP Co-Evaluation/Treatment: Yes Reason for Co-Treatment: Complexity of the patient's impairments (multi-system involvement);For patient/therapist safety;To address functional/ADL transfers PT goals addressed during session: Mobility/safety with mobility;Balance;Strengthening/ROM OT goals addressed during session: ADL's and self-care      AM-PAC PT "6 Clicks" Daily Activity  Outcome Measure  Difficulty turning over in bed (including adjusting bedclothes, sheets and blankets)?: A Lot Difficulty moving from lying on back to sitting on the side of the bed? : Total Difficulty sitting down on and standing up from a chair with arms (e.g., wheelchair, bedside commode, etc,.)?: Total Help needed moving to and from a bed to chair (including a wheelchair)?: A Lot Help needed walking in hospital room?: A Lot Help needed climbing 3-5 steps with a railing? : Total 6 Click Score: 9    End of Session Equipment Utilized During Treatment: Gait belt Activity Tolerance: Patient tolerated treatment well Patient left: in chair;with call bell/phone within reach;with nursing/sitter in room Nurse Communication: Mobility status PT Visit Diagnosis: Unsteadiness on feet (R26.81);Muscle weakness (generalized) (M62.81);Difficulty in walking, not elsewhere classified (R26.2);Other abnormalities of gait and mobility (R26.89);Pain Pain - Right/Left: Left Pain - part of body: Shoulder     Time: [id number] PT Time Calculation (min) (ACUTE ONLY): 58 min  Charges:  $Gait Training: 23-37 mins                    G Codes:       Erline LevineKellyn Keymiah Lyles, PTA Pager: [phone]     Carolynne EdouardKellyn R Jamesia Linnen 05/23/2017, 4:13 PM

## 2017-05-23 NOTE — Progress Notes (Signed)
Occupational Therapy Progress Note  Pt instructed in HEP for bil. UEs AROM as well as strengthening with theraband.    Recommend CIR.      05/23/17 1700  OT Visit Information  Last OT Received On 05/23/17  Assistance Needed +2  History of Present Illness Pt adm with acute on chronic heart failure and underwent Heartware HVAD implant on 6/28. Returned to OR later that day for evacuation of mediastinal hematoma. On 7/4 developed ileus with likely aspiration and intubated 7/4-7/6.   Pt developed Heme + stools with progressive anemia as well as  Kirby FunkVtach 05/02/17.  He underwent EGD with showed suspected AVM 05/11/2017. He was intubated for procedure and extubated 05/08/17 PMH - gout, DMII, HTN, CKD, CAD S/P CABGx6 2012, chronic systolic heart failure and St jude ICD.   Precautions  Precautions Sternal  Precaution Comments Heartware HVAD  Pain Assessment  Pain Assessment No/denies pain  Cognition  Arousal/Alertness Awake/alert  Behavior During Therapy WFL for tasks assessed/performed;Flat affect  Overall Cognitive Status Impaired/Different from baseline  Current Attention Level Selective;Sustained  Following Commands Follows one step commands with increased time  Problem Solving Slow processing  ADL  Eating/Feeding Set up;Sitting  Eating/Feeding Details (indicate cue type and reason) Pt able to feed himself his evening meal with regular utensils after set up.  Pt sitting in recliner   Exercises  Exercises General Upper Extremity  General Exercises - Upper Extremity  Shoulder Flexion AROM;Right;Left;10 reps;Seated  Shoulder ABduction AROM;5 reps;Seated  Shoulder Horizontal ABduction Strengthening;Right;Left;5 reps;Theraband;Seated  Elbow Flexion AROM;Strengthening;Left;Right;10 reps;Seated;Theraband  Theraband Level (Shoulder Horizontal Abduction) Level 1 (Yellow)  Theraband Level (Elbow Flexion) Level 1 (Yellow)  General Exercises - Lower Extremity  Long Arc Quad AROM;Both;10 reps;Seated   Hip Flexion/Marching Both;10 reps;Seated;AAROM  Other Exercises  Other Exercises Pt provided with yellow theraband and bands tied to bedrails.  Pt instructed in exercises to perform in bed and seated.  Shoulder flexion, abduction, diagonals - pt encouraged to move UEs as much as he can tolerate in bed.  Also encouraged pt to perform straight leg raises and heel slides while in bed.  He was also encouraged to perform above exercises while seated   OT - End of Session  Activity Tolerance Patient tolerated treatment well  Patient left in chair;with call bell/phone within reach;with nursing/sitter in room  Nurse Communication Mobility status  OT Assessment/Plan  OT Plan Discharge plan remains appropriate  OT Visit Diagnosis Muscle weakness (generalized) (M62.81)  OT Frequency (ACUTE ONLY) Min 3X/week  Recommendations for Other Services Rehab consult  Follow Up Recommendations CIR;Supervision/Assistance - 24 hour  OT Equipment 3 in 1 bedside commode  AM-PAC OT "6 Clicks" Daily Activity Outcome Measure  Help from another person eating meals? 3  Help from another person taking care of personal grooming? 3  Help from another person toileting, which includes using toliet, bedpan, or urinal? 2  Help from another person bathing (including washing, rinsing, drying)? 2  Help from another person to put on and taking off regular upper body clothing? 2  Help from another person to put on and taking off regular lower body clothing? 1  6 Click Score 13  ADL G Code Conversion CL  OT Goal Progression  Progress towards OT goals Progressing toward goals  OT Time Calculation  OT Start Time (ACUTE ONLY) 1658  OT Stop Time (ACUTE ONLY) 1724  OT Time Calculation (min) 26 min  OT General Charges  $OT Visit 1 Procedure  OT Treatments  $Therapeutic Exercise 23-37  mins  Reyn[employer] AmericanWendi Damarien Nyman, OTR/L [phone]

## 2017-05-23 NOTE — Progress Notes (Signed)
ANTICOAGULATION CONSULT NOTE - Follow Up Consult  Pharmacy Consult for Warfarin  Indication: HVAD  No Active Allergies  Patient Measurements: Height: 6' (182.9 cm) Weight: 230 lb 9.6 oz (104.6 kg) IBW/kg (Calculated) : 77.6  Vital Signs: Temp: 98.5 F (36.9 C) (08/03 0400) Temp Source: Oral (08/03 0400) BP: 111/91 (08/03 0400) Pulse Rate: 56 (08/03 0800)  Labs:  Recent Labs  05/21/17 0500 05/22/17 0434 05/23/17 0420  HGB  --  8.5* 8.8*  HCT  --  26.7* 27.1*  PLT  --  355 398  LABPROT 30.3* 31.1* 27.3*  INR 2.83 2.92 2.48  CREATININE 2.01* 2.02* 1.78*    Estimated Creatinine Clearance: 59.3 mL/min (A) (by C-G formula based on SCr of 1.78 mg/dL (H)).   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 He developed an ileus on 7/5, warfarin was held, and heparin bridge started once INR fell below 1.8.  Heparin stopped 7/10 with INR 2.19. On 7/13, Hgb dropped and he had melena - warfarin held again and bridged with heparin while INR < 1.8.  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   Warfarin resumed 7/18 without heparin bridge.  CBC stable today. Diet has been advanced, and is also receiving supplements. Strong suspicion that amiodarone is contributing to elevated INR.  INR down back into goal range today.  Goal of Therapy:  INR 2-2.5 Monitor platelets by anticoagulation protocol: Yes   Plan:  1) Warfarin 1 mg x 1 tonight.  Hopefully can keep in goal range. 2) Daily PT/INR.  Tad MooreJessica Darrious Youman, Pharm D, BCPS  Clinical Pharmacist Pager [phone]  05/23/2017 1:52 PM

## 2017-05-23 NOTE — Progress Notes (Signed)
Patient ID: Edd FabianHarry D Ciotti, male   DOB: 1961-07-06, 56 y.o.   MRN: [id number]   Advanced Heart Failure VAD Team Note  Subjective:    Events: --HVAD placed 6/28.  Returned to the OR that evening with high chest tube output, evacuation of mediastinal hematoma.  --Extubated 6/29. Milrinone stopped on 7/4. --7/4, patient developed ileus with respiratory compromise. NGT placed with 3 L suctioned out.  CXR with suspicion for aspiration PNA.  Patient had to be intubated.  He went into atrial fibrillation with RVR.  He became hypotensive and was started on norepinephrine and phenylephrine.  Amiodarone gtt begun.    --Extubated again on 7/6.  -- 7/13 Had 2 sustained episodes of VT. Had to be cardioverted 1. Second episode broke with overdrive pacing with Dr. Graciela HusbandsKlein. VAD speed turned down to 2700.  --Melena due to acuteGI bleed --> 2 units PRBCs 7/14, 2 units 7/15. 3 U PRBCs 7/16,  05/19/2017 3 UPRBCs.  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  He had epinephrine, APC, and 2 clips. Intubated prior to procedure and placed on norepinephrine. Speed dropped to 2660.  --7/18 Back in VT with overdrive pacing. Lidocaine was started in addition to amiodarone. Extubated. --7/19 lidocaine stopped with confusion. Neuro consulted.  EEG normal.  CT of head no acute findings. 7/20 confusion had resolved.  --1 unit PRBCs 7/20.  --7/22, developed sustained VT with rate around 130 shortly after getting IV Lasix.  We were able to pace him out and back to NSR.  He was put back on amiodarone gtt.  --More VT on 7/25, paced out.  Back on IV amiodarone gtt.  NSR.  -- 7/31 Ramp ECHO decrease speed 2600 rpm.  --8/1 VT- paced out --8/2 VT - amio stopped started on sotalol  Yesterday had recurrent VT. Amio stopped and started on sotalol initially at bid. Torsemide cut back to 40 mg daily.   Denies SOB. No bleeding problems.    MAP 96.    HVAD INTERROGATION:  HVAD:  Flow 4  liters/min, speed 2600,  power 4W,  Peak 7.1 Trough 1.8.  Suction On. Lavare On. No alarms.    Objective:    Vital Signs:   Temp:  [98.3 F (36.8 C)-98.7 F (37.1 C)] 98.5 F (36.9 C) (08/03 0400) Pulse Rate:  [56-73] 56 (08/03 0800) Resp:  [20-23] 20 (08/03 0800) BP: (103-117)/(80-91) 111/91 (08/03 0400) SpO2:  [100 %] 100 % (08/03 0800) Weight:  [230 lb 9.6 oz (104.6 kg)] 230 lb 9.6 oz (104.6 kg) (08/03 0542) Last BM Date: 05/22/17 Mean arterial Pressure 90s  Intake/Output:   Intake/Output Summary (Last 24 hours) at 05/23/17 1014 Last data filed at 05/23/17 0953  Gross per 24 hour  Intake              677 ml  Output             2150 ml  Net            -1473 ml     Physical Exam     CVP 9.  Physical Exam: GENERAL: Well appearing. In bed. NAD.  HEENT: normal  NECK: Supple, JVP 8-9.   2+ bilaterally, no bruits.  No lymphadenopathy or thyromegaly appreciated.   CARDIAC:  Mechanical heart sounds with LVAD hum present.  LUNGS:  Clear to auscultation bilaterally.  ABDOMEN:  Soft, round, nontender, positive bowel sounds x4.     LVAD exit site: Dressing dry and intact.  No erythema or drainage.  Stabilization device present and accurately applied.  Driveline dressing is being changed daily per sterile technique. EXTREMITIES:  Warm and dry, no cyanosis, clubbing, rash. R and LLE 2+ edema. RUE PICC NEUROLOGIC:  Alert and oriented x 4.  Gait steady.  No aphasia.  No dysarthria.  Affect pleasant.        Telemetry   Yesterday episode VT. No VT overnight. Had Sinus Brady 30-40s. Now in the 60s. Personally reviewed.   Labs   Basic Metabolic Panel:  Recent Labs Lab 05/18/17 0521 05/19/17 0413 05/20/17 0459 05/21/17 0500 05/22/17 0434 05/23/17 0420  NA 134* 136 134* 134* 138 138  K 3.8 4.5 4.2 3.8 4.3 4.1  CL 109 110 108 109 111 109  CO2 19* 18* 18* 19* 21* 23  GLUCOSE 92 174* 238* 235* 157* 157*  BUN 38* 39* 47* 49* 50* 47*  CREATININE 1.65* 1.90* 2.04* 2.01* 2.02*  1.78*  CALCIUM 7.7* 7.9* 7.9* 7.9* 7.9* 7.9*  MG 1.8 2.0  --  1.8  --  1.7    Liver Function Tests: No results for input(s): AST, ALT, ALKPHOS, BILITOT, PROT, ALBUMIN in the last 168 hours. No results for input(s): LIPASE, AMYLASE in the last 168 hours. No results for input(s): AMMONIA in the last 168 hours.  CBC:  Recent Labs Lab 05/18/17 0521 05/19/17 0413 05/20/17 0459 05/22/17 0434 05/23/17 0420  WBC 7.1 8.9 6.2 8.9 9.7  NEUTROABS  --  8.5*  --   --   --   HGB 8.2* 9.2* 8.5* 8.5* 8.8*  HCT 25.6* 29.0* 26.4* 26.7* 27.1*  MCV 88.9 89.8 89.2 89.0 89.7  PLT 348 387 383 355 398    INR:  Recent Labs Lab 05/19/17 0413 05/20/17 0459 05/21/17 0500 05/22/17 0434 05/23/17 0420  INR 2.25 2.64 2.83 2.92 2.48    Other results:     Imaging   No results found.   Medications:     Scheduled Medications: . amLODipine  5 mg Oral Daily  . febuxostat  120 mg Oral Daily  . feeding supplement (ENSURE ENLIVE)  237 mL Oral TID BM  . hydrALAZINE  100 mg Oral Q8H  . insulin aspart  0-15 Units Subcutaneous TID WC  . isosorbide mononitrate  30 mg Oral Daily  . mexiletine  300 mg Oral Q8H  . mirtazapine  15 mg Oral QHS  . pantoprazole  40 mg Oral BID  . sodium chloride flush  10-40 mL Intracatheter Q12H  . sotalol  80 mg Oral Q12H  . spironolactone  25 mg Oral Daily  . torsemide  40 mg Oral Daily  . Warfarin - Pharmacist Dosing Inpatient   Does not apply q1800    Infusions: . sodium chloride Stopped (05/02/17 0630)  . sodium chloride 10 mL/hr at 05/16/17 1800    PRN Medications: albuterol, docusate, hydrALAZINE, neomycin-bacitracin-polymyxin, ondansetron (ZOFRAN) IV, oxyCODONE, phenol, sodium chloride flush, traMADol   Patient Profile   56 yo with CAD s/p CABG, ischemic cardiomyopathy/chronic systolic CHF, tophaceous gout, and CKD stage 3 was admitted for diuresis and consideration for LVAD placement. S/p HVAD on 6/28  Assessment/Plan:    1. Acute/chronic  systolic CHF s/p HVAD: Ischemic cardiomyopathy.  [employer] ICD.  Echo (6/18) with EF 15%, mildly dilated RV with moderately decreased systolic function.  s/p HVAD placement 6/28 and had to return to OR to evacuate mediastinal hematoma.  He had been weaned off pressors/milrinone, but developed ileus w/ likely aspiration event 7/4, re-intubated,  developed afib/RVR requiring norepinephrine. Extubated 7/6.  VAD speed turned down to 2660 with VT. VAD parameters currently look good.  Milrinone stopped 7/17 early am with recurrent VT.  Now off norepinephrine.  MAPs in 90s. CO-OX 66%. CVP 9. Complicated with RV failure and VT.  - Continue torsemide 40 mg daily. Continue spironolactone 25 daily.  - MAP high. Increase amlodipine to 10 mg daily. Continue hydralazine to 100 mg three times a day imdur 60 mg daily.  - Off ASA with GI Bleed - INR coming down 2.48. Discussed with Pharm D. Has not had coumadin 48 hours.  Goal 2-2.5.   - Has been off dig/arb with elevated creatinine.  .   2. AKI on CKD stage 3: Suspect a component of peri-op ATN, worsened with ileus, vomiting, intubation/sedation. Creatinine down to 1.78.       3. Symptomatic anemia due to acute UGI bleeding:  Received 3 units PRBCs 7/16 and 3 units PRBCs on 04/22/2017. 1 unit PRBCs 7/20. 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  Requiring 2 clips + epi + APC. Does not appear to be bleeding actively now, hgb higher today.  - Got Feraheme 7/28.  - Continue Protonix 40 po bid. - Back on warfarin, aiming for INR 2-2.5 range.  Will leave off aspirin for now. Dosing per pharm. - Transfuse hgb < 8.  4. Ventricular tachycardia: Status post ventricular tachycardia 2 on 7/13. Possible suction event. VAD speed turned down to 2700.  Recurrent VT 7/17. EP called to bedside. Overdrive pacing successful for about 10 minutes but VT recurred.  Milrinone turned off, remained in VT overnight but hemodynamically stable.  Speed decreased to  2660.  On 7/18, we were able to pace him out of VT.  Lidocaine added then stopped due to confusion/mental status changes.  He had VT 7/22 about 30 minutes after getting IV Lasix, had to be paced out again => may have been due to rapid fluid shift.  Pt paced out of VT 3 separate occasions 7/25. Got amio bolus x 2 and started back on IV infusion.  Transitioned to amiodarone 400 mg tid.   On 8/1 had recurrent VT and paced out again.  Recurrent VT 8/2 per EP amio stopped and sotalol started. Had episodes of bradycardia over night. EP recommendations appreciated.  - Continue mexiletine up to 300 mg every 8 hrs .    - Sotalol initially started bid but dropping back to qd with bradycardia this morning.  HR around 60 currently.  5. CAD: s/p CABG 2012. No S/S ischemia.  Off aspirin due to GI bleeding. No change.   6. Gout: Severe tophaceous gout. Left shoulder pain ?gout.   - Completed 3 days of prednisone.   - Continue uloric.  7. Atrial fibrillation: Developed atrial fibrillation with RVR in setting of aspiration PNA on 7/4. - On Sotalol now. Maintaining NSR.   8. Malnutrition: Now off TPN. Per Speech- FEEs performed. Prealbumin very low.  Nutrition recommendations appreciated.  Continue supplements. Appetite improving.  9. Aspiration PNA with acute hypoxemic respiratory failure on 7/4: He was extubated on 7/6. Tracheal aspirate with Klebsiella and citrobacter. Both sensitive to Zosyn. Completed abx 7/13. No s/s active infection 10. Ileus: Resolved.  11. Deconditioning:  Plan for CIR when stable. OT/PT appreciated.   12. Acute Respiratory Failure: Intubated 7/17 in setting of complex GI intervention. Extubated 7/18. Resolved on room air.   13. Delirium: Resolved. No further. Appreciate neuro input Possibly  related to lidocaine.  Lidocaine stopped. Confusion resolved. CT of head negative. EEG ok. No further workup per neuro. No change today.  15. Insomnia: On remeron for sleep and depression. 16. Post-op  depression: Remeron started 7/28.   I reviewed the HVAD parameters from today, and compared the results to the patient's prior recorded data.  No programming changes were made.  The HVAD is functioning within specified parameters.    CIR next week. Discussed at length with rehab case manager. Will need another CIR consult on Monday.    Tonye Becket, NP 05/23/2017, 10:14 AM  VAD Team --- VAD ISSUES ONLY--- Pager [phone] (7am - 7am) Advanced Heart Failure Team  Pager [phone] (M-F; 7a - 4p)  Please contact [facility] for night-coverage after hours (4p -7a ) and weekends on [email or web address]  Patient seen with NP, agree with the above note.  Creatinine lower and hgb higher today.  Good diuresis yesterday with po torsemide, weight down.  CVP 9.  Co-ox 66%.   - Continue lower dose torsemide 40 mg daily, will try to avoid IV Lasix and rapid fluid shifts.   No VT overnight.  Now on sotalol and off amiodarone.  Bradycardic this morning so sotalol held.  Plan per EP => probably restart at once daily tomorrow but await recs.   MAP remains mildly elevated.  Increase amlodipine to 10 mg daily today.   Marca Ancona 05/23/2017 11:07 AM

## 2017-05-23 NOTE — Progress Notes (Signed)
Progress Note  Patient Name: James [name] Date of Encounter: 05/23/2017  Primary Cardiologist: Dr. Shirlee LatchMcLean  Subjective   Just finished lunch, no CP, no active SOB, walked further with PT today  Inpatient Medications    Scheduled Meds: . [START ON 05/24/2017] amLODipine  10 mg Oral Daily  . amLODipine  5 mg Oral Once  . febuxostat  120 mg Oral Daily  . feeding supplement (ENSURE ENLIVE)  237 mL Oral TID BM  . hydrALAZINE  100 mg Oral Q8H  . insulin aspart  0-15 Units Subcutaneous TID WC  . isosorbide mononitrate  30 mg Oral Daily  . mexiletine  300 mg Oral Q8H  . mirtazapine  15 mg Oral QHS  . pantoprazole  40 mg Oral BID  . sodium chloride flush  10-40 mL Intracatheter Q12H  . sotalol  80 mg Oral Q12H  . spironolactone  25 mg Oral Daily  . torsemide  40 mg Oral Daily  . Warfarin - Pharmacist Dosing Inpatient   Does not apply q1800   Continuous Infusions: . sodium chloride Stopped (05/02/17 0630)  . sodium chloride 10 mL/hr at 05/16/17 1800   PRN Meds: albuterol, docusate, hydrALAZINE, neomycin-bacitracin-polymyxin, ondansetron (ZOFRAN) IV, oxyCODONE, phenol, sodium chloride flush, traMADol   Vital Signs    Vitals:   05/23/17 0022 05/23/17 0400 05/23/17 0542 05/23/17 0800  BP:  (!) 111/91    Pulse:    (!) 56  Resp:    20  Temp: 98.7 F (37.1 C) 98.5 F (36.9 C)    TempSrc: Oral Oral    SpO2:    100%  Weight:   230 lb 9.6 oz (104.6 kg)   Height:        Intake/Output Summary (Last 24 hours) at 05/23/17 1156 Last data filed at 05/23/17 0953  Gross per 24 hour  Intake              677 ml  Output             2150 ml  Net            -1473 ml   Filed Weights   05/21/17 1135 05/22/17 0500 05/23/17 0542  Weight: 248 lb 12.8 oz (112.9 kg) 240 lb 1.6 oz (108.9 kg) 230 lb 9.6 oz (104.6 kg)    Telemetry    SB/SR, 50's-60's currently, had bradycardia last evening/early AM with occasional pacing at 40- Personally Reviewed  ECG     SB 59bpm, RBBB, LAD, QRS is  180ms, QTc 524ms, is OK given BBB,  - Personally Reviewed  Physical Exam   GEN: No acute distress, chronically ill appearing Neck: No JVD Cardiac: LVAD hum.  Respiratory: Clear to auscultation bilaterally. GI: Soft, nontender MS: marked edema Neuro:  Nonfocal  Psych: Normal affect   Labs    Chemistry Recent Labs Lab 05/21/17 0500 05/22/17 0434 05/23/17 0420  NA 134* 138 138  K 3.8 4.3 4.1  CL 109 111 109  CO2 19* 21* 23  GLUCOSE 235* 157* 157*  BUN 49* 50* 47*  CREATININE 2.01* 2.02* 1.78*  CALCIUM 7.9* 7.9* 7.9*  GFRNONAA 36* 36* 42*  GFRAA 42* 41* 48*  ANIONGAP 6 6 6      Hematology Recent Labs Lab 05/20/17 0459 05/22/17 0434 05/23/17 0420  WBC 6.2 8.9 9.7  RBC 2.96* 3.00* 3.02*  HGB 8.5* 8.5* 8.8*  HCT 26.4* 26.7* 27.1*  MCV 89.2 89.0 89.7  MCH 28.7 28.3 29.1  MCHC 32.2 31.8 32.5  RDW 18.1* 18.0* 18.0*  PLT 383 355 398    Cardiac EnzymesNo results for input(s): TROPONINI in the last 168 hours. No results for input(s): TROPIPOC in the last 168 hours.   BNP Recent Labs Lab 05/22/17 0434  BNP 1,523.2*     DDimer No results for input(s): DDIMER in the last 168 hours.   Radiology    No results found.  Cardiac Studies   RHC 04/10/2017  RA mean 20 RV 71/22 PA 70/31 mean 45 PCWP mean 24 CVP/PCWP 0.83 PAPi 1.95 Oxygen saturations: PA 49% AO 100% Cardiac Output (Fick) 4.95  Cardiac Index (Fick) 2.28 PVR 4.24 WU Cardiac Output (Thermo) 5.74 Cardiac Index (Fick) 2.65 PVR 3.65 WU  EHCO 04/08/2017 EF 15%  Patient Profile     56 y.o. male history of gout, DMII, HTN, CKD, CAD S/P CABGx6 2012, chronic systolic heart failure and St jude ICD admitted 03/25/2017 s/p LVAD 04/16/2017, post op complications w/ileus, LGIB requiring numerous transfusions with AVMs, s/p epinephrine, APC, and 2 clips with GI, he has had PAF, and recurrent issues with recurrent sustained VT having to be pace terminated multiple times, on high dose amiodarone and mexiletine (AMS  w/lidocaine), did well for a few weeks though of late more VT and yesterday Sotalol started.  Assessment & Plan    1. Recurrent VT     Very difficult case with numerous co-morbid conditions/issues     Bradycardia with 2 doses of sotalol     Will resume his amiodarone, continue his mexiletine, resume Sotalol tomorrow, daily dosing    2. Acute/chronic CHF (systolic) w/ LVAD 3. CAD 4. PAF 5. AKI/CRD  Signed, Sheilah Pigeonenee Lynn Ursuy, PA-C  05/23/2017, 11:56 AM    I have seen and examined this patient with Francis Dowseenee Ursuy.  Agree with above, note added to reflect my findings.  On exam, mechanical sounds of LVAD.  Went back into VT with ambulation yesterday. Paced out. Was place on sotalol but had significant bradycardia and V pacing. Plan to restart amiodarone today with sotalol daily to hopefully avoid bradycardia. If he has more VT, may consider ablation. Will M. Camnitz MD 05/23/2017 4:37 PM

## 2017-05-24 LAB — PROTIME-INR
INR: 2.4
Prothrombin Time: 26.6 seconds — ABNORMAL HIGH (ref 11.4–15.2)

## 2017-05-24 LAB — BASIC METABOLIC PANEL
Anion gap: 3 — ABNORMAL LOW (ref 5–15)
BUN: 46 mg/dL — AB (ref 6–20)
CHLORIDE: 110 mmol/L (ref 101–111)
CO2: 24 mmol/L (ref 22–32)
Calcium: 7.6 mg/dL — ABNORMAL LOW (ref 8.9–10.3)
Creatinine, Ser: 1.75 mg/dL — ABNORMAL HIGH (ref 0.61–1.24)
GFR calc Af Amer: 49 mL/min — ABNORMAL LOW (ref >60)
GFR calc non Af Amer: 42 mL/min — ABNORMAL LOW (ref >60)
Glucose, Bld: 159 mg/dL — ABNORMAL HIGH (ref 65–99)
POTASSIUM: 3.5 mmol/L (ref 3.5–5.1)
SODIUM: 137 mmol/L (ref 135–145)

## 2017-05-24 LAB — CBC
HEMATOCRIT: 24.7 % — AB (ref 39.0–52.0)
HEMOGLOBIN: 8 g/dL — AB (ref 13.0–17.0)
MCH: 28.9 pg (ref 26.0–34.0)
MCHC: 32.4 g/dL (ref 30.0–36.0)
MCV: 89.2 fL (ref 78.0–100.0)
Platelets: 288 10*3/uL (ref 150–400)
RBC: 2.77 MIL/uL — ABNORMAL LOW (ref 4.22–5.81)
RDW: 17.8 % — ABNORMAL HIGH (ref 11.5–15.5)
WBC: 12.5 10*3/uL — AB (ref 4.0–10.5)

## 2017-05-24 LAB — GLUCOSE, CAPILLARY
GLUCOSE-CAPILLARY: 149 mg/dL — AB (ref 65–99)
Glucose-Capillary: 149 mg/dL — ABNORMAL HIGH (ref 65–99)
Glucose-Capillary: 228 mg/dL — ABNORMAL HIGH (ref 65–99)
Glucose-Capillary: 147 mg/dL — ABNORMAL HIGH (ref 65–99)

## 2017-05-24 LAB — MAGNESIUM: MAGNESIUM: 1.4 mg/dL — AB (ref 1.7–2.4)

## 2017-05-24 LAB — COOXEMETRY PANEL
Carboxyhemoglobin: 1.5 % (ref 0.5–1.5)
METHEMOGLOBIN: 1.2 % (ref 0.0–1.5)
O2 Saturation: 68.6 %
Total hemoglobin: 8.3 g/dL — ABNORMAL LOW (ref 12.0–16.0)

## 2017-05-24 LAB — LACTATE DEHYDROGENASE: LDH: 160 U/L (ref 98–192)

## 2017-05-24 MED ORDER — AMIODARONE HCL 200 MG PO TABS
400.0000 mg | ORAL_TABLET | Freq: Two times a day (BID) | ORAL | Status: DC
  Administered 2017-05-24 – 2017-05-26 (×5): 400 mg via ORAL
  Filled 2017-05-24 (×5): qty 2

## 2017-05-24 MED ORDER — POTASSIUM CHLORIDE CRYS ER 20 MEQ PO TBCR
40.0000 meq | EXTENDED_RELEASE_TABLET | Freq: Once | ORAL | Status: AC
  Administered 2017-05-24: 40 meq via ORAL
  Filled 2017-05-24: qty 2

## 2017-05-24 MED ORDER — WARFARIN SODIUM 1 MG PO TABS
1.0000 mg | ORAL_TABLET | Freq: Once | ORAL | Status: AC
  Administered 2017-05-24: 1 mg via ORAL
  Filled 2017-05-24: qty 1

## 2017-05-24 MED ORDER — MAGNESIUM OXIDE 400 (241.3 MG) MG PO TABS
400.0000 mg | ORAL_TABLET | Freq: Every day | ORAL | Status: DC
  Administered 2017-05-25 – 2017-05-29 (×5): 400 mg via ORAL
  Filled 2017-05-24 (×5): qty 1

## 2017-05-24 MED ORDER — MAGNESIUM SULFATE 50 % IJ SOLN
3.0000 g | Freq: Once | INTRAVENOUS | Status: AC
  Administered 2017-05-24: 3 g via INTRAVENOUS
  Filled 2017-05-24: qty 6

## 2017-05-24 MED ORDER — POTASSIUM CHLORIDE CRYS ER 20 MEQ PO TBCR
40.0000 meq | EXTENDED_RELEASE_TABLET | Freq: Every day | ORAL | Status: DC
  Administered 2017-05-25 – 2017-05-27 (×3): 40 meq via ORAL
  Filled 2017-05-24 (×4): qty 2

## 2017-05-24 NOTE — Progress Notes (Addendum)
Patient ID: James [name], male   DOB: [birth year], 56 y.o.   MRN: [id number]   Advanced Heart Failure VAD Team Note  Subjective:    Events: --HVAD placed 6/28.  Returned to the OR that evening with high chest tube output, evacuation of mediastinal hematoma.  --Extubated 6/29. Milrinone stopped on 7/4. --7/4, patient developed ileus with respiratory compromise. NGT placed with 3 L suctioned out.  CXR with suspicion for aspiration PNA.  Patient had to be intubated.  He went into atrial fibrillation with RVR.  He became hypotensive and was started on norepinephrine and phenylephrine.  Amiodarone gtt begun.    --Extubated again on 7/6.  -- 7/13 Had 2 sustained episodes of VT. Had to be cardioverted 1. Second episode broke with overdrive pacing with Dr. Graciela HusbandsKlein. VAD speed turned down to 2700.  --Melena due to acuteGI bleed --> 2 units PRBCs 7/14, 2 units 7/15. 3 U PRBCs 7/16,  05/04/2017 3 UPRBCs.  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  He had epinephrine, APC, and 2 clips. Intubated prior to procedure and placed on norepinephrine. Speed dropped to 2660.  --7/18 Back in VT with overdrive pacing. Lidocaine was started in addition to amiodarone. Extubated. --7/19 lidocaine stopped with confusion. Neuro consulted.  EEG normal.  CT of head no acute findings. 7/20 confusion had resolved.  --1 unit PRBCs 7/20.  --7/22, developed sustained VT with rate around 130 shortly after getting IV Lasix.  We were able to pace him out and back to NSR.  He was put back on amiodarone gtt.  --More VT on 7/25, paced out.  Back on IV amiodarone gtt.  NSR.  -- 7/31 Ramp ECHO decrease speed 2600 rpm.  --8/1 VT- paced out --8/2 VT - started on sotalol  No further VT on sotalol.  HR decreased to 50 range at times.  CVP 6-7 today.  Co-ox 69%.  MAP 85.  Walked further with PT yesterday. Had BM yesterday, normal/brown.  No dyspnea.    HVAD INTERROGATION:  HVAD:  Flow 4.6  liters/min, speed 2600,  power 3.9 W,  Peak 7 Trough 3.  Suction On. Lavare On. No alarms.    Objective:    Vital Signs:   Temp:  [97.5 F (36.4 C)-98.9 F (37.2 C)] 98.8 F (37.1 C) (08/04 1137) Pulse Rate:  [58-64] 64 (08/04 1050) Resp:  [16-31] 31 (08/04 1050) BP: (90-115)/(63-86) 107/83 (08/04 1050) SpO2:  [99 %-100 %] 100 % (08/04 1050) Weight:  [229 lb (103.9 kg)] 229 lb (103.9 kg) (08/04 0415) Last BM Date: 05/24/17 Mean arterial Pressure 80s  Intake/Output:   Intake/Output Summary (Last 24 hours) at 05/24/17 1144 Last data filed at 05/24/17 1138  Gross per 24 hour  Intake              120 ml  Output             2900 ml  Net            -2780 ml     Physical Exam     CVP 6-7.  Physical Exam: GENERAL: Chronically ill-appearing.  HEENT: normal  NECK: Supple, JVP 8-9.   2+ bilaterally, no bruits.  No lymphadenopathy or thyromegaly appreciated.   CARDIAC:  Mechanical heart sounds with LVAD hum present.  LUNGS:  Clear to auscultation bilaterally.  ABDOMEN:  Soft, round, nontender, positive bowel sounds x4.     LVAD exit site: Dressing dry and intact.  No erythema  or drainage.  Stabilization device present and accurately applied.  Driveline dressing is being changed daily per sterile technique. EXTREMITIES:  Warm and dry, no cyanosis, clubbing, rash. R and LLE 1+ edema. RUE PICC NEUROLOGIC:  Alert and oriented x 4.  Gait steady.  No aphasia.  No dysarthria.  Affect pleasant.        Telemetry   Personally reviewed, NSR 60s   Labs   Basic Metabolic Panel:  Recent Labs Lab 05/18/17 0521 05/19/17 0413 05/20/17 0459 05/21/17 0500 05/22/17 0434 05/23/17 0420 05/24/17 0426  NA 134* 136 134* 134* 138 138 137  K 3.8 4.5 4.2 3.8 4.3 4.1 3.5  CL 109 110 108 109 111 109 110  CO2 19* 18* 18* 19* 21* 23 24  GLUCOSE 92 174* 238* 235* 157* 157* 159*  BUN 38* 39* 47* 49* 50* 47* 46*  CREATININE 1.65* 1.90* 2.04* 2.01* 2.02* 1.78* 1.75*  CALCIUM 7.7* 7.9* 7.9* 7.9*  7.9* 7.9* 7.6*  MG 1.8 2.0  --  1.8  --  1.7 1.4*    Liver Function Tests: No results for input(s): AST, ALT, ALKPHOS, BILITOT, PROT, ALBUMIN in the last 168 hours. No results for input(s): LIPASE, AMYLASE in the last 168 hours. No results for input(s): AMMONIA in the last 168 hours.  CBC:  Recent Labs Lab 05/19/17 0413 05/20/17 0459 05/22/17 0434 05/23/17 0420 05/24/17 0426  WBC 8.9 6.2 8.9 9.7 12.5*  NEUTROABS 8.5*  --   --   --   --   HGB 9.2* 8.5* 8.5* 8.8* 8.0*  HCT 29.0* 26.4* 26.7* 27.1* 24.7*  MCV 89.8 89.2 89.0 89.7 89.2  PLT 387 383 355 398 288    INR:  Recent Labs Lab 05/20/17 0459 05/21/17 0500 05/22/17 0434 05/23/17 0420 05/24/17 0426  INR 2.64 2.83 2.92 2.48 2.40    Other results:     Imaging   No results found.   Medications:     Scheduled Medications: . amiodarone  400 mg Oral BID  . amLODipine  10 mg Oral Daily  . febuxostat  120 mg Oral Daily  . feeding supplement (ENSURE ENLIVE)  237 mL Oral TID BM  . hydrALAZINE  100 mg Oral Q8H  . insulin aspart  0-15 Units Subcutaneous TID WC  . isosorbide mononitrate  30 mg Oral Daily  . mexiletine  300 mg Oral Q8H  . mirtazapine  15 mg Oral QHS  . pantoprazole  40 mg Oral BID  . sodium chloride flush  10-40 mL Intracatheter Q12H  . sotalol  80 mg Oral Daily  . spironolactone  25 mg Oral Daily  . torsemide  40 mg Oral Daily  . warfarin  1 mg Oral ONCE-1800  . Warfarin - Pharmacist Dosing Inpatient   Does not apply q1800    Infusions: . sodium chloride Stopped (05/02/17 0630)  . sodium chloride 10 mL/hr at 05/16/17 1800    PRN Medications: albuterol, docusate, hydrALAZINE, neomycin-bacitracin-polymyxin, ondansetron (ZOFRAN) IV, oxyCODONE, phenol, sodium chloride flush, traMADol   Patient Profile   56 yo with CAD s/p CABG, ischemic cardiomyopathy/chronic systolic CHF, tophaceous gout, and CKD stage 3 was admitted for diuresis and consideration for LVAD placement. S/p HVAD on  6/28  Assessment/Plan:    1. Acute/chronic systolic CHF s/p HVAD: Ischemic cardiomyopathy.  St Jude ICD.  Echo (6/18) with EF 15%, mildly dilated RV with moderately decreased systolic function.  s/p HVAD placement 6/28 and had to return to OR to evacuate mediastinal hematoma.  He had  been weaned off pressors/milrinone, but developed ileus w/ likely aspiration event 7/4, re-intubated, developed afib/RVR requiring norepinephrine. Extubated 7/6.  VAD speed turned down to 2660 with VT. VAD parameters currently look good.  Milrinone stopped 7/17 early am with recurrent VT.  Now off norepinephrine.  MAPs in 80s. CO-OX 69%. CVP 6-7. Complicated with RV failure and VT.  - Continue torsemide 40 mg daily. Continue spironolactone 25 daily. Replace K and Mg.  - MAP better today.  Continue amlodipine 10 mg daily. Continue hydralazine to 100 mg three times a day, imdur 60 mg daily.  - Off ASA with GI Bleed - INR 2.4, Goal 2-2.5.  - Has been off dig/arb with elevated creatinine.  .   2. AKI on CKD stage 3: Suspect a component of peri-op ATN, worsened with ileus, vomiting, intubation/sedation. Creatinine down to 1.75.       3. Symptomatic anemia due to acute UGI bleeding:  Received 3 units PRBCs 7/16 and 3 units PRBCs on 05/19/2017. 1 unit PRBCs 7/20. 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  Requiring 2 clips + epi + APC. Does not appear to be bleeding actively now, hgb higher today.  - Got Feraheme 7/28.  - Continue Protonix 40 po bid. - Back on warfarin, aiming for INR 2-2.5 range.  Will leave off aspirin for now. Dosing per pharm. - Transfuse hgb < 8 => 8 today but no active bleeding, follow.  4. Ventricular tachycardia: Status post ventricular tachycardia 2 on 7/13. Possible suction event. VAD speed turned down to 2700.  Recurrent VT 7/17. EP called to bedside. Overdrive pacing successful for about 10 minutes but VT recurred.  Milrinone turned off, remained in VT overnight but  hemodynamically stable.  Speed decreased to 2660.  On 7/18, we were able to pace him out of VT.  Lidocaine added then stopped due to confusion/mental status changes.  He had VT 7/22 about 30 minutes after getting IV Lasix, had to be paced out again => may have been due to rapid fluid shift.  Pt paced out of VT 3 separate occasions 7/25. Got amio bolus x 2 and started back on IV infusion.  Transitioned to amiodarone 400 mg tid.  On 8/1 had recurrent VT and paced out again.  Recurrent VT 8/2 per EP sotalol started along with amiodarone, sotalol decreased to once daily with bradycardia.  He is staying in NSR.  - Continue mexiletine up to 300 mg every 8 hrs .    - Continue sotalol 80 mg daily. - Decrease amiodarone to 400 mg bid. - ECG reviewed: QTc 555 msec in setting of RBBB.  Will accept for now.    5. CAD: s/p CABG 2012. No S/S ischemia.  Off aspirin due to GI bleeding. No change.   6. Gout: Severe tophaceous gout. Left shoulder pain ?gout.   - Completed 3 days of prednisone.   - Continue uloric.  7. Atrial fibrillation: Developed atrial fibrillation with RVR in setting of aspiration PNA on 7/4. - On Sotalol now along with amiodarone. Maintaining NSR.   8. Malnutrition: Now off TPN. Per Speech- FEEs performed. Prealbumin very low.  Nutrition recommendations appreciated.  Continue supplements. Appetite improving.  9. Aspiration PNA with acute hypoxemic respiratory failure on 7/4: He was extubated on 7/6. Tracheal aspirate with Klebsiella and citrobacter. Both sensitive to Zosyn. Completed abx 7/13. No s/s active infection 10. Ileus: Resolved.  11. Deconditioning:  Plan for CIR when stable. OT/PT appreciated.  12. Acute Respiratory Failure: Intubated 7/17 in setting of complex GI intervention. Extubated 7/18. Resolved on room air.   13. Delirium: Resolved. No further. Appreciate neuro input Possibly related to lidocaine.  Lidocaine stopped. Confusion resolved. CT of head negative. EEG ok. No further  workup per neuro. No change today.  15. Insomnia: On remeron for sleep and depression. 16. Post-op depression: Remeron started 7/28.   I reviewed the HVAD parameters from today, and compared the results to the patient's prior recorded data.  No programming changes were made.  The HVAD is functioning within specified parameters.    CIR next week. Will need another CIR consult on Monday.   Marca Anconaalton McLean, MD 05/24/2017, 11:44 AM  VAD Team --- VAD ISSUES ONLY--- Pager [phone] (7am - 7am) Advanced Heart Failure Team  Pager [phone] (M-F; 7a - 4p)  Please contact [facility] for night-coverage after hours (4p -7a ) and weekends on [email or web address]

## 2017-05-24 NOTE — Progress Notes (Signed)
Received a page from the nurse regarding the pts sotalol. Pts HR is low 60s. Nurse states that the pt had some pacing overnight at 50.  Nurse is questioning whether the pt can have his sotalol. Nurse was instructed to give pt sotalol per Dr. Shirlee LatchMclean.

## 2017-05-24 NOTE — Progress Notes (Signed)
ANTICOAGULATION CONSULT NOTE - Follow Up Consult  Pharmacy Consult for Warfarin  Indication: HVAD  No Active Allergies  Patient Measurements: Height: 6' (182.9 cm) Weight: 229 lb (103.9 kg) IBW/kg (Calculated) : 77.6  Vital Signs: Temp: 98.9 F (37.2 C) (08/04 0415) Temp Source: Oral (08/04 0415) BP: 115/84 (08/04 0415) Pulse Rate: 63 (08/04 0007)  Labs:  Recent Labs  05/22/17 0434 05/23/17 0420 05/24/17 0426  HGB 8.5* 8.8* 8.0*  HCT 26.7* 27.1* 24.7*  PLT 355 398 288  LABPROT 31.1* 27.3* 26.6*  INR 2.92 2.48 2.40  CREATININE 2.02* 1.78* 1.75*    Estimated Creatinine Clearance: 60.1 mL/min (A) (by C-G formula based on SCr of 1.75 mg/dL (H)).   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 He developed an ileus on 7/5, warfarin was held, and heparin bridge started once INR fell below 1.8.  Heparin stopped 7/10 with INR 2.19. On 7/13, Hgb dropped and he had melena - warfarin held again and bridged with heparin while INR < 1.8.  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   Warfarin resumed 7/18 without heparin bridge.  CBC down today with hgb of 8, no overt bleeding noted. Diet has been advanced, and is also receiving supplements. Strong suspicion that amiodarone is contributing to elevated INR.  INR continues to be in goal range today. Patient very sensitive to warfarin will continue low dose tonight.  Goal of Therapy:  INR 2-2.5 Monitor platelets by anticoagulation protocol: Yes   Plan:  1) Warfarin 1 mg x 1 tonight.   2) Daily PT/INR.  Sheppard CoilFrank Wilson PharmD., BCPS Clinical Pharmacist Pager [phone] 05/24/2017 8:32 AM

## 2017-05-25 LAB — CBC
HEMATOCRIT: 25.2 % — AB (ref 39.0–52.0)
Hemoglobin: 8.1 g/dL — ABNORMAL LOW (ref 13.0–17.0)
MCH: 28.8 pg (ref 26.0–34.0)
MCHC: 32.1 g/dL (ref 30.0–36.0)
MCV: 89.7 fL (ref 78.0–100.0)
PLATELETS: 298 10*3/uL (ref 150–400)
RBC: 2.81 MIL/uL — ABNORMAL LOW (ref 4.22–5.81)
RDW: 18.2 % — AB (ref 11.5–15.5)
WBC: 12.8 10*3/uL — ABNORMAL HIGH (ref 4.0–10.5)

## 2017-05-25 LAB — GLUCOSE, CAPILLARY
GLUCOSE-CAPILLARY: 145 mg/dL — AB (ref 65–99)
GLUCOSE-CAPILLARY: 149 mg/dL — AB (ref 65–99)
GLUCOSE-CAPILLARY: 152 mg/dL — AB (ref 65–99)
Glucose-Capillary: 121 mg/dL — ABNORMAL HIGH (ref 65–99)

## 2017-05-25 LAB — MAGNESIUM: Magnesium: 1.7 mg/dL (ref 1.7–2.4)

## 2017-05-25 LAB — COOXEMETRY PANEL
Carboxyhemoglobin: 1.5 % (ref 0.5–1.5)
Methemoglobin: 1.2 % (ref 0.0–1.5)
O2 SAT: 68.4 %
TOTAL HEMOGLOBIN: 8.4 g/dL — AB (ref 12.0–16.0)

## 2017-05-25 LAB — BASIC METABOLIC PANEL
Anion gap: 6 (ref 5–15)
BUN: 44 mg/dL — AB (ref 6–20)
CALCIUM: 7.8 mg/dL — AB (ref 8.9–10.3)
CO2: 23 mmol/L (ref 22–32)
Chloride: 106 mmol/L (ref 101–111)
Creatinine, Ser: 1.73 mg/dL — ABNORMAL HIGH (ref 0.61–1.24)
GFR calc Af Amer: 50 mL/min — ABNORMAL LOW (ref >60)
GFR, EST NON AFRICAN AMERICAN: 43 mL/min — AB (ref >60)
GLUCOSE: 135 mg/dL — AB (ref 65–99)
Potassium: 3.6 mmol/L (ref 3.5–5.1)
Sodium: 135 mmol/L (ref 135–145)

## 2017-05-25 LAB — PROTIME-INR
INR: 1.9
PROTHROMBIN TIME: 22.1 s — AB (ref 11.4–15.2)

## 2017-05-25 LAB — LACTATE DEHYDROGENASE: LDH: 153 U/L (ref 98–192)

## 2017-05-25 MED ORDER — POTASSIUM CHLORIDE CRYS ER 20 MEQ PO TBCR
40.0000 meq | EXTENDED_RELEASE_TABLET | Freq: Once | ORAL | Status: AC
  Administered 2017-05-25: 40 meq via ORAL
  Filled 2017-05-25: qty 2

## 2017-05-25 MED ORDER — MAGNESIUM SULFATE 50 % IJ SOLN
3.0000 g | Freq: Once | INTRAVENOUS | Status: AC
  Administered 2017-05-25: 3 g via INTRAVENOUS
  Filled 2017-05-25: qty 6

## 2017-05-25 MED ORDER — WARFARIN SODIUM 2 MG PO TABS
2.0000 mg | ORAL_TABLET | Freq: Once | ORAL | Status: AC
  Administered 2017-05-25: 2 mg via ORAL
  Filled 2017-05-25: qty 1

## 2017-05-25 NOTE — Progress Notes (Signed)
Pt ambulated 32920ft with assistance from 2 PT staff and one RN using EVA walker.  Pt c/o increased pain in sacral area at site of pressure wound.  Requests to be seen by wound mgt. Order placed as per protocol. WOC also asked to evaluate pt preexisting RLE wound.

## 2017-05-25 NOTE — Progress Notes (Signed)
ANTICOAGULATION CONSULT NOTE - Follow Up Consult  Pharmacy Consult for Warfarin  Indication: HVAD  No Active Allergies  Patient Measurements: Height: 6' (182.9 cm) Weight: 226 lb (102.5 kg) (without VAD) IBW/kg (Calculated) : 77.6  Vital Signs: Temp: 98.2 F (36.8 C) (08/05 0414) Temp Source: Oral (08/05 0414) Pulse Rate: 66 (08/05 0414)  Labs:  Recent Labs  05/23/17 0420 05/24/17 0426 05/25/17 0347  HGB 8.8* 8.0* 8.1*  HCT 27.1* 24.7* 25.2*  PLT 398 288 298  LABPROT 27.3* 26.6* 22.1*  INR 2.48 2.40 1.90  CREATININE 1.78* 1.75* 1.73*    Estimated Creatinine Clearance: 60.5 mL/min (A) (by C-G formula based on SCr of 1.73 mg/dL (H)).   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 He developed an ileus on 7/5, warfarin was held, and heparin bridge started once INR fell below 1.8.  Heparin stopped 7/10 with INR 2.19. On 7/13, Hgb dropped and he had melena - warfarin held again and bridged with heparin while INR < 1.8.  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   Warfarin resumed 7/18 without heparin bridge.  CBC stable today with hgb of 8.1, no overt bleeding noted. Diet has been advancing, and is also receiving supplements.   INR dropped overnight to just below goal range at 1.9. Patient very sensitive to warfarin but requirements may be increasing with better po intake. Will give extra warfarin today, this morning in hopes of correcting downward trend.  Goal of Therapy:  INR 2-2.5 Monitor platelets by anticoagulation protocol: Yes   Plan:  1) Warfarin 2 mg this morning  2) Daily PT/INR.  Sheppard CoilFrank Wilson PharmD., BCPS Clinical Pharmacist Pager [phone] 05/25/2017 7:14 AM

## 2017-05-25 NOTE — Progress Notes (Addendum)
Physical Therapy Treatment Patient Details Name: James FabianHarry D Conrad MRN: [id number] DOB: 1961-05-06 Today's Date: 05/25/2017    History of Present Illness  Pt adm with acute on chronic heart failure and underwent Heartware HVAD implant on 6/28. Returned to OR later that day for evacuation of mediastinal hematoma. On 7/4 developed ileus with likely aspiration and intubated 7/4-7/6.   Pt developed Heme + stools with progressive anemia as well as  Kirby FunkVtach 05/02/17.  He underwent EGD with showed suspected AVM 05/03/2017. He was intubated for procedure and extubated 05/08/17 PMH - gout, DMII, HTN, CKD, CAD S/P CABGx6 2012, chronic systolic heart failure and St jude ICD.     PT Comments    Patient tolerated gait training with EVA walker and +3 due to need for chair follow. +2 for transfers. Pt was more communicative today and reported feeling a little better but tired. HR in 50s-60s and RR into 30s. Pt declined sitting in recliner due to increased pain at sacrum when sitting up. Pt positioned toward L side with pillows. Family present end of session. Continue to progress as tolerated.    Follow Up Recommendations  CIR;Supervision for mobility/OOB     Equipment Recommendations  Other (comment) (TBD next venue)    Recommendations for Other Services       Precautions / Restrictions Precautions Precautions: Sternal Precaution Comments: Heartware HVAD    Mobility  Bed Mobility Overal bed mobility: Needs Assistance Bed Mobility: Supine to Sit;Sit to Supine     Supine to sit: Mod assist;+2 for physical assistance;HOB elevated Sit to supine: Max assist;+2 for physical assistance   General bed mobility comments: cues for sequencing and technique; use of bed pad to scoot hips; assist to mobilize bilat LE and trunk  Transfers Overall transfer level: Needs assistance Equipment used: 2 person hand held assist (EVA walker) Transfers: Sit to/from U[employer] CorporationStand;Stand Pivot Transfers Sit to Stand: Mod assist;+2  physical assistance Stand pivot transfers: +2 physical assistance;Min assist       General transfer comment: mod A +2 to stand from EOB and recliner with assist at hips to power up and achieve upright posture; pt with posterior bias upon standing; multimodal cues for weight shifting and trunk extension to improve balance and find COG; cues for hand placement; stand pivot with gait belt and +2 face to face  Ambulation/Gait Ambulation/Gait assistance: Mod assist;+2 safety/equipment (+1 extra person for chair follow) Ambulation Distance (Feet): 180 Feet Assistive device:  (EVA walker) Gait Pattern/deviations: Step-through pattern;Decreased step length - right;Decreased step length - left;Decreased dorsiflexion - right;Decreased dorsiflexion - left;Trunk flexed Gait velocity: decreased   General Gait Details: multimodal cues for posture and weight shifting; assist to manage AD and for balance and improved L LE advancement; vc for sequencing and increased bilat step length; 2 standing rest breaks due to increased RR into 30s; cues for breathing technique   Stairs            Wheelchair Mobility    Modified Rankin (Stroke Patients Only)       Balance   Sitting-balance support: Feet supported;Bilateral upper extremity supported Sitting balance-Leahy Scale: Fair     Standing balance support: Bilateral upper extremity supported Standing balance-Leahy Scale: Poor                              Cognition Arousal/Alertness: Awake/alert Behavior During Therapy: WFL for tasks assessed/performed;Flat affect Overall Cognitive Status: Impaired/Different from baseline  Current Attention Level: Selective   Following Commands: Follows one step commands with increased time;Follows one step commands consistently     Problem Solving: Slow processing General Comments: pt more communicative this session      Exercises      General Comments  General comments (skin integrity, edema, etc.): HR in 50s-60s       Pertinent Vitals/Pain Pain Assessment: Faces Faces Pain Scale: Hurts even more Pain Location: buttocks Pain Descriptors / Indicators: Grimacing;Sore Pain Intervention(s): Monitored during session;Repositioned    Home Living                      Prior Function            PT Goals (current goals can now be found in the care plan section) Progress towards PT goals: Progressing toward goals    Frequency    Min 3X/week      PT Plan Current plan remains appropriate    Co-evaluation              AM-PAC PT "6 Clicks" Daily Activity  Outcome Measure  Difficulty turning over in bed (including adjusting bedclothes, sheets and blankets)?: A Lot Difficulty moving from lying on back to sitting on the side of the bed? : Total Difficulty sitting down on and standing up from a chair with arms (e.g., wheelchair, bedside commode, etc,.)?: Total Help needed moving to and from a bed to chair (including a wheelchair)?: A Lot Help needed walking in hospital room?: A Lot Help needed climbing 3-5 steps with a railing? : Total 6 Click Score: 9    End of Session Equipment Utilized During Treatment: Gait belt Activity Tolerance: Patient tolerated treatment well Patient left: with call bell/phone within reach;in bed;with family/visitor present;Other (comment) (pt positioned toward L side with pillows) Nurse Communication: Mobility status;Other (comment) (turning pt toward R side) PT Visit Diagnosis: Unsteadiness on feet (R26.81);Muscle weakness (generalized) (M62.81);Difficulty in walking, not elsewhere classified (R26.2);Other abnormalities of gait and mobility (R26.89);Pain Pain - Right/Left: Left Pain - part of body: Shoulder     Time: 1314-1400 PT Time Calculation (min) (ACUTE ONLY): 46 min  Charges:  $Gait Training: 23-37 mins $Therapeutic Activity: 8-22 mins                    G Codes:        Erline LevineKellyn Nareg Breighner, PTA Pager: [phone]     Carolynne EdouardKellyn R Klynn Linnemann 05/25/2017, 4:38 PM

## 2017-05-25 NOTE — Progress Notes (Signed)
Patient ID: Edd FabianHarry D Daily, male   DOB: 12/03/1960, 56 y.o.   MRN: [id number]   Advanced Heart Failure VAD Team Note  Subjective:    Events: --HVAD placed 6/28.  Returned to the OR that evening with high chest tube output, evacuation of mediastinal hematoma.  --Extubated 6/29. Milrinone stopped on 7/4. --7/4, patient developed ileus with respiratory compromise. NGT placed with 3 L suctioned out.  CXR with suspicion for aspiration PNA.  Patient had to be intubated.  He went into atrial fibrillation with RVR.  He became hypotensive and was started on norepinephrine and phenylephrine.  Amiodarone gtt begun.    --Extubated again on 7/6.  -- 7/13 Had 2 sustained episodes of VT. Had to be cardioverted 1. Second episode broke with overdrive pacing with Dr. Graciela HusbandsKlein. VAD speed turned down to 2700.  --Melena due to acuteGI bleed --> 2 units PRBCs 7/14, 2 units 7/15. 3 U PRBCs 7/16,  05/16/2017 3 UPRBCs.  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  He had epinephrine, APC, and 2 clips. Intubated prior to procedure and placed on norepinephrine. Speed dropped to 2660.  --7/18 Back in VT with overdrive pacing. Lidocaine was started in addition to amiodarone. Extubated. --7/19 lidocaine stopped with confusion. Neuro consulted.  EEG normal.  CT of head no acute findings. 7/20 confusion had resolved.  --1 unit PRBCs 7/20.  --7/22, developed sustained VT with rate around 130 shortly after getting IV Lasix.  We were able to pace him out and back to NSR.  He was put back on amiodarone gtt.  --More VT on 7/25, paced out.  Back on IV amiodarone gtt.  NSR.  -- 7/31 Ramp ECHO decrease speed 2600 rpm.  --8/1 VT- paced out --8/2 VT - started on sotalol  No further VT on sotalol.  HR 60s, ?afib (rhythm difficult).  CVP 8 today.  Co-ox 68%.  MAP 88.  No dyspnea.  Did not get out of bed yesterday. I/Os negative, weight down 3 lbs.   No melena/BRBPR.    HVAD INTERROGATION:    HVAD:  Flow 4.1 liters/min, speed 2600,  power 3.6 W,  Peak 7.5 Trough 3.8.  Suction On. Lavare On. No alarms.    Objective:    Vital Signs:   Temp:  [97.7 F (36.5 C)-98.8 F (37.1 C)] 97.7 F (36.5 C) (08/05 0740) Pulse Rate:  [55-70] 65 (08/05 0740) Resp:  [19-31] 23 (08/05 0740) BP: (101-107)/(78-86) 101/85 (08/05 0740) SpO2:  [99 %-100 %] 99 % (08/05 0740) Weight:  [226 lb (102.5 kg)] 226 lb (102.5 kg) (08/05 0630) Last BM Date: 05/24/17 Mean arterial Pressure 88  Intake/Output:   Intake/Output Summary (Last 24 hours) at 05/25/17 1025 Last data filed at 05/25/17 0530  Gross per 24 hour  Intake              120 ml  Output             2925 ml  Net            -2805 ml     Physical Exam     CVP 8.  Physical Exam: GENERAL: Chronically ill-appearing.  HEENT: normal  NECK: Supple, JVP 8-9. No lymphadenopathy or thyromegaly appreciated.  CARDIAC: Mechanical heart sounds with LVAD hum present.  LUNGS: Clear to auscultation bilaterally.  ABDOMEN: Soft, round, nontender, positive bowel sounds x4.  LVAD exit site: Dressing dry and intact. No erythema or drainage. Stabilization device present and accurately applied.  Driveline dressing is being changed daily per sterile technique. EXTREMITIES: Warm and dry, no cyanosis, clubbing, rash. R and LLE 1+ edema to knees. RUE PICC NEUROLOGIC: Alert and oriented x 4. Gait steady. No aphasia. No dysarthria. Affect pleasant.      Telemetry   Personally reviewed, HR 60s, rhythm difficult ?afib   Labs   Basic Metabolic Panel:  Recent Labs Lab 05/19/17 0413  05/21/17 0500 05/22/17 0434 05/23/17 0420 05/24/17 0426 05/25/17 0347  NA 136  < > 134* 138 138 137 135  K 4.5  < > 3.8 4.3 4.1 3.5 3.6  CL 110  < > 109 111 109 110 106  CO2 18*  < > 19* 21* 23 24 23   GLUCOSE 174*  < > 235* 157* 157* 159* 135*  BUN 39*  < > 49* 50* 47* 46* 44*  CREATININE 1.90*  < > 2.01* 2.02* 1.78* 1.75* 1.73*  CALCIUM 7.9*  < >  7.9* 7.9* 7.9* 7.6* 7.8*  MG 2.0  --  1.8  --  1.7 1.4* 1.7  < > = values in this interval not displayed.  Liver Function Tests: No results for input(s): AST, ALT, ALKPHOS, BILITOT, PROT, ALBUMIN in the last 168 hours. No results for input(s): LIPASE, AMYLASE in the last 168 hours. No results for input(s): AMMONIA in the last 168 hours.  CBC:  Recent Labs Lab 05/19/17 0413 05/20/17 0459 05/22/17 0434 05/23/17 0420 05/24/17 0426 05/25/17 0347  WBC 8.9 6.2 8.9 9.7 12.5* 12.8*  NEUTROABS 8.5*  --   --   --   --   --   HGB 9.2* 8.5* 8.5* 8.8* 8.0* 8.1*  HCT 29.0* 26.4* 26.7* 27.1* 24.7* 25.2*  MCV 89.8 89.2 89.0 89.7 89.2 89.7  PLT 387 383 355 398 288 298    INR:  Recent Labs Lab 05/21/17 0500 05/22/17 0434 05/23/17 0420 05/24/17 0426 05/25/17 0347  INR 2.83 2.92 2.48 2.40 1.90    Other results:     Imaging   No results found.   Medications:     Scheduled Medications: . amiodarone  400 mg Oral BID  . amLODipine  10 mg Oral Daily  . febuxostat  120 mg Oral Daily  . feeding supplement (ENSURE ENLIVE)  237 mL Oral TID BM  . hydrALAZINE  100 mg Oral Q8H  . insulin aspart  0-15 Units Subcutaneous TID WC  . isosorbide mononitrate  30 mg Oral Daily  . magnesium oxide  400 mg Oral Daily  . mexiletine  300 mg Oral Q8H  . mirtazapine  15 mg Oral QHS  . pantoprazole  40 mg Oral BID  . potassium chloride  40 mEq Oral Daily  . sodium chloride flush  10-40 mL Intracatheter Q12H  . sotalol  80 mg Oral Daily  . spironolactone  25 mg Oral Daily  . torsemide  40 mg Oral Daily  . warfarin  2 mg Oral Once  . Warfarin - Pharmacist Dosing Inpatient   Does not apply q1800    Infusions: . sodium chloride Stopped (05/02/17 0630)  . sodium chloride 10 mL/hr at 05/16/17 1800    PRN Medications: albuterol, docusate, hydrALAZINE, neomycin-bacitracin-polymyxin, ondansetron (ZOFRAN) IV, oxyCODONE, phenol, sodium chloride flush, traMADol   Patient Profile   56 yo with  CAD s/p CABG, ischemic cardiomyopathy/chronic systolic CHF, tophaceous gout, and CKD stage 3 was admitted for diuresis and consideration for LVAD placement. S/p HVAD on 6/28  Assessment/Plan:    1. Acute/chronic systolic CHF s/p HVAD:  Ischemic cardiomyopathy.  St Jude ICD.  Echo (6/18) with EF 15%, mildly dilated RV with moderately decreased systolic function.  s/p HVAD placement 6/28 and had to return to OR to evacuate mediastinal hematoma.  He had been weaned off pressors/milrinone, but developed ileus w/ likely aspiration event 7/4, re-intubated, developed afib/RVR requiring norepinephrine. Extubated 7/6.  VAD speed turned down to 2660 with VT. VAD parameters currently look good.  Milrinone stopped 7/17 early am with recurrent VT.  Now off norepinephrine.  MAPs in 88. CO-OX 68%. CVP 8. Complicated with RV failure and VT.  - Continue torsemide 40 mg daily, weight coming down. Continue spironolactone 25 daily. Replace K and Mg.  - MAP overall better.  Continue amlodipine 10 mg daily. Continue hydralazine to 100 mg three times a day, imdur 60 mg daily.  - Off ASA with GI Bleed - INR 1.9, Goal 2-2.5.  - Has been off dig/arb with elevated creatinine.     2. AKI on CKD stage 3: Suspect a component of peri-op ATN, worsened with ileus, vomiting, intubation/sedation. Creatinine down to 1.73.       3. Symptomatic anemia due to acute UGI bleeding:  Received 3 units PRBCs 7/16 and 3 units PRBCs on 05/15/2017. 1 unit PRBCs 7/20. 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  Requiring 2 clips + epi + APC. Does not appear to be bleeding actively now, hgb stable.  - Got Feraheme 7/28.  - Continue Protonix 40 po bid. - Back on warfarin, aiming for INR 2-2.5 range.  Will leave off aspirin for now. Dosing per pharm. - Transfuse hgb < 8 => 8.1 today, follow.  4. Ventricular tachycardia: Status post ventricular tachycardia 2 on 7/13. Possible suction event. VAD speed turned down to 2700.   Recurrent VT 7/17. EP called to bedside. Overdrive pacing successful for about 10 minutes but VT recurred.  Milrinone turned off, remained in VT overnight but hemodynamically stable.  Speed decreased to 2660.  On 7/18, we were able to pace him out of VT.  Lidocaine added then stopped due to confusion/mental status changes.  He had VT 7/22 about 30 minutes after getting IV Lasix, had to be paced out again => may have been due to rapid fluid shift.  Pt paced out of VT 3 separate occasions 7/25. Got amio bolus x 2 and started back on IV infusion.  Transitioned to amiodarone 400 mg tid.  On 8/1 had recurrent VT and paced out again.  Recurrent VT 8/2 per EP sotalol started along with amiodarone, sotalol decreased to once daily with bradycardia.  No further VT.  - Continue mexiletine up to 300 mg every 8 hrs .    - Continue sotalol 80 mg daily. - Decreased amiodarone to 400 mg bid.   5. CAD: s/p CABG 2012. No S/S ischemia.  Off aspirin due to GI bleeding. No change.   6. Gout: Severe tophaceous gout. Left shoulder pain ?gout.   - Completed 3 days of prednisone.   - Continue uloric.  7. Atrial fibrillation: Developed atrial fibrillation with RVR in setting of aspiration PNA on 7/4.  ?Atrial fibrillation today.  Will get ECG.  8. Malnutrition: Now off TPN. Per Speech- FEEs performed. Prealbumin very low. Nutrition recommendations appreciated.  Continue supplements. Appetite improving.  9. Aspiration PNA with acute hypoxemic respiratory failure on 7/4: He was extubated on 7/6. Tracheal aspirate with Klebsiella and citrobacter. Both sensitive to Zosyn. Completed abx 7/13. No s/s active infection 10.  Ileus: Resolved.  11. Deconditioning:  Plan for CIR when stable. OT/PT appreciated.   12. Acute Respiratory Failure: Intubated 7/17 in setting of complex GI intervention. Extubated 7/18. Resolved on room air.   13. Delirium: Resolved. No further. Appreciate neuro input Possibly related to lidocaine.  Lidocaine  stopped. Confusion resolved. CT of head negative. EEG ok. No further workup per neuro. No change today.  15. Insomnia: On remeron for sleep and depression. 16. Post-op depression: Remeron started 7/28.   I reviewed the HVAD parameters from today, and compared the results to the patient's prior recorded data.  No programming changes were made.  The HVAD is functioning within specified parameters.    CIR consult.   Marca Ancona, MD 05/25/2017, 10:25 AM  VAD Team --- VAD ISSUES ONLY--- Pager [phone] (7am - 7am) Advanced Heart Failure Team  Pager [phone] (M-F; 7a - 4p)  Please contact [facility] for night-coverage after hours (4p -7a ) and weekends on [email or web address]

## 2017-05-26 DIAGNOSIS — Z95811 Presence of heart assist device: Secondary | ICD-10-CM

## 2017-05-26 DIAGNOSIS — I5023 Acute on chronic systolic (congestive) heart failure: Secondary | ICD-10-CM

## 2017-05-26 LAB — MAGNESIUM: Magnesium: 2 mg/dL (ref 1.7–2.4)

## 2017-05-26 LAB — GLUCOSE, CAPILLARY
GLUCOSE-CAPILLARY: 125 mg/dL — AB (ref 65–99)
GLUCOSE-CAPILLARY: 192 mg/dL — AB (ref 65–99)
Glucose-Capillary: 164 mg/dL — ABNORMAL HIGH (ref 65–99)
Glucose-Capillary: 112 mg/dL — ABNORMAL HIGH (ref 65–99)

## 2017-05-26 LAB — CBC
HEMATOCRIT: 23.4 % — AB (ref 39.0–52.0)
Hemoglobin: 7.5 g/dL — ABNORMAL LOW (ref 13.0–17.0)
MCH: 29 pg (ref 26.0–34.0)
MCHC: 32.1 g/dL (ref 30.0–36.0)
MCV: 90.3 fL (ref 78.0–100.0)
PLATELETS: 293 10*3/uL (ref 150–400)
RBC: 2.59 MIL/uL — ABNORMAL LOW (ref 4.22–5.81)
RDW: 18.4 % — AB (ref 11.5–15.5)
WBC: 17.8 10*3/uL — ABNORMAL HIGH (ref 4.0–10.5)

## 2017-05-26 LAB — COOXEMETRY PANEL
Carboxyhemoglobin: 1.8 % — ABNORMAL HIGH (ref 0.5–1.5)
Methemoglobin: 0.9 % (ref 0.0–1.5)
O2 SAT: 63.3 %
TOTAL HEMOGLOBIN: 8.2 g/dL — AB (ref 12.0–16.0)

## 2017-05-26 LAB — BASIC METABOLIC PANEL
ANION GAP: 7 (ref 5–15)
BUN: 45 mg/dL — AB (ref 6–20)
CALCIUM: 7.7 mg/dL — AB (ref 8.9–10.3)
CO2: 22 mmol/L (ref 22–32)
Chloride: 108 mmol/L (ref 101–111)
Creatinine, Ser: 1.98 mg/dL — ABNORMAL HIGH (ref 0.61–1.24)
GFR calc Af Amer: 42 mL/min — ABNORMAL LOW (ref >60)
GFR calc non Af Amer: 37 mL/min — ABNORMAL LOW (ref >60)
GLUCOSE: 150 mg/dL — AB (ref 65–99)
Potassium: 4.2 mmol/L (ref 3.5–5.1)
Sodium: 137 mmol/L (ref 135–145)

## 2017-05-26 LAB — DIFFERENTIAL
BASOS PCT: 0 %
Basophils Absolute: 0 10*3/uL (ref 0.0–0.1)
EOS ABS: 0.1 10*3/uL (ref 0.0–0.7)
EOS PCT: 0 %
Lymphocytes Relative: 2 %
Lymphs Abs: 0.4 10*3/uL — ABNORMAL LOW (ref 0.7–4.0)
MONO ABS: 1.5 10*3/uL — AB (ref 0.1–1.0)
Monocytes Relative: 8 %
NEUTROS PCT: 90 %
Neutro Abs: 15.8 10*3/uL — ABNORMAL HIGH (ref 1.7–7.7)

## 2017-05-26 LAB — PROTIME-INR
INR: 2.05
PROTHROMBIN TIME: 23.4 s — AB (ref 11.4–15.2)

## 2017-05-26 LAB — LACTATE DEHYDROGENASE: LDH: 181 U/L (ref 98–192)

## 2017-05-26 LAB — PREPARE RBC (CROSSMATCH)

## 2017-05-26 MED ORDER — SODIUM CHLORIDE 0.9 % IV SOLN: Freq: Once | INTRAVENOUS | Status: DC

## 2017-05-26 MED ORDER — METOCLOPRAMIDE HCL 5 MG/ML IJ SOLN
5.0000 mg | Freq: Four times a day (QID) | INTRAMUSCULAR | Status: AC
  Administered 2017-05-26 – 2017-05-27 (×3): 5 mg via INTRAVENOUS
  Filled 2017-05-26 (×3): qty 2

## 2017-05-26 MED ORDER — WARFARIN SODIUM 1 MG PO TABS
1.0000 mg | ORAL_TABLET | Freq: Once | ORAL | Status: AC
  Administered 2017-05-26: 1 mg via ORAL
  Filled 2017-05-26: qty 1

## 2017-05-26 NOTE — Care Management Important Message (Signed)
Important Message  Patient Details  Name: James FabianHarry D Angert MRN: [id number] Date of Birth: Jan 28, 1961   Medicare Important Message Given:  Yes    [name] 05/26/2017, 10:31 AM

## 2017-05-26 NOTE — Progress Notes (Signed)
CSW briefly met with patient at bedside. Patient was changing to batteries with Physical Therapist to ambulate the hall. Patient acknowledged CSW and stated "doing ok". CSW will attempt to revisit later today or tomorrow. CSW continues to follow for supportive intervention and any other needs as they arise. Raquel Sarna, [place], [place]

## 2017-05-26 NOTE — Progress Notes (Signed)
Physical Therapy Treatment Patient Details Name: [name] MRN: [id number] DOB: 02-25-1961 Today's Date: 05/26/2017    History of Present Illness  Pt adm with acute on chronic heart failure and underwent Heartware HVAD implant on 6/28. Returned to OR later that day for evacuation of mediastinal hematoma. On 7/4 developed ileus with likely aspiration and intubated 7/4-7/6.   Pt developed Heme + stools with progressive anemia as well as  James [name] 05/02/17.  He underwent EGD with showed suspected AVM 05-18-2017. He was intubated for procedure and extubated 05/08/17 PMH - gout, DMII, HTN, CKD, CAD S/P CABGx6 2012, chronic systolic heart failure and St jude ICD.     PT Comments    Patient required max A +2 to stand this session. Attempted to ambulate however pt unable to achieve upright posture or take steps. Use of Stedy to transfer to recliner from EOB. Pt appeared "lost in thought", fatigued, and inattentive to tasks at times during session. Pt did c/o nausea throughout session as well and had received Zofran prior to arrival. Continue to progress as tolerated.    Follow Up Recommendations  CIR;Supervision for mobility/OOB     Equipment Recommendations  Other (comment) (TBD next venue)    Recommendations for Other Services       Precautions / Restrictions Precautions Precautions: Sternal Precaution Comments: Heartware HVAD Restrictions Weight Bearing Restrictions: No    Mobility  Bed Mobility               General bed mobility comments: pt sitting EOB with nursing staff upon arrival  Transfers Overall transfer level: Needs assistance Equipment used: 2 person hand held assist (EVA walker) Transfers: Sit to/from Stand Sit to Stand: +2 physical assistance;Max assist;From elevated surface         General transfer comment: pt stood X4 (X2 with EVA walker and X2 with Stedy); pt required max A +2 with use of bed pad under hips to come into standing but could not maintain standing or  pivot feet long enough for transfer with EVA walker; pt with flexed trunk and unable to achieve upright posture; use of Stedy to get to recliner from bed  Ambulation/Gait             General Gait Details: pt unable to take steps this session   Stairs            Wheelchair Mobility    Modified Rankin (Stroke Patients Only)       Balance   Sitting-balance support: Feet supported;Bilateral upper extremity supported Sitting balance-Leahy Scale: Fair     Standing balance support: Bilateral upper extremity supported Standing balance-Leahy Scale: Poor                              Cognition Arousal/Alertness: Awake/alert Behavior During Therapy: WFL for tasks assessed/performed;Flat affect Overall Cognitive Status: Impaired/Different from baseline                     Current Attention Level: Focused   Following Commands: Follows one step commands with increased time;Follows one step commands consistently   Awareness: Intellectual Problem Solving: Slow processing;Decreased initiation;Requires verbal cues;Requires tactile cues        Exercises      General Comments General comments (skin integrity, edema, etc.): HR between 43-77 during session; pt c/o nausea throughout and was given Zofran prior to session      Pertinent Vitals/Pain Pain Assessment: Faces Faces Pain  Scale: Hurts even more Pain Location: buttocks Pain Descriptors / Indicators: Grimacing;Sore;Guarding Pain Intervention(s): Limited activity within patient's tolerance;Monitored during session;Repositioned    Home Living                      Prior Function            PT Goals (current goals can now be found in the care plan section) Progress towards PT goals: Progressing toward goals    Frequency    Min 3X/week      PT Plan Current plan remains appropriate    Co-evaluation              AM-PAC PT "6 Clicks" Daily Activity  Outcome Measure   Difficulty turning over in bed (including adjusting bedclothes, sheets and blankets)?: A Lot Difficulty moving from lying on back to sitting on the side of the bed? : Total Difficulty sitting down on and standing up from a chair with arms (e.g., wheelchair, bedside commode, etc,.)?: Total Help needed moving to and from a bed to chair (including a wheelchair)?: A Lot Help needed walking in hospital room?: A Lot Help needed climbing 3-5 steps with a railing? : Total 6 Click Score: 9    End of Session Equipment Utilized During Treatment: Gait belt Activity Tolerance: Patient limited by fatigue Patient left: with call bell/phone within reach;with family/visitor present;Other (comment);in chair (pt positioned toward L side ) Nurse Communication: Mobility status PT Visit Diagnosis: Unsteadiness on feet (R26.81);Muscle weakness (generalized) (M62.81);Difficulty in walking, not elsewhere classified (R26.2);Other abnormalities of gait and mobility (R26.89);Pain Pain - Right/Left: Left Pain - part of body: Shoulder     Time: [id number] PT Time Calculation (min) (ACUTE ONLY): 35 min  Charges:  $Therapeutic Activity: 23-37 mins                    G Codes:       Erline LevineKellyn Boniface Goffe, PTA Pager: [phone]     Carolynne EdouardKellyn R Rayen Dafoe 05/26/2017, 1:55 PM

## 2017-05-26 NOTE — Consult Note (Signed)
WOC consulted again over the weekend, please see consultation notes from 12-20-16 and 05/22/17.  Orders updated 05/22/17 for sacrum and RLE wounds.  WOC will follow sacral wound at least weekly.   Thanks  Jules Vidovich [employer] MSN, RN,CWOCN, CNS, CWON-AP [phone])

## 2017-05-26 NOTE — Progress Notes (Signed)
Patient ID: James [name], male   DOB: 05/14/1961, 56 y.o.   MRN: [id number] HVAD Rounding Note  Subjective:    He reports having 7 stools last night that were loose but not watery, no blood or melena. None today. He also developed nausea last night that persists today. Zofran helped a little but still nauseous and weak. Doesn't feel like he is going to vomit. No abdominal pain. Started after eating blueberry cobbler last night.  Too weak to walk today but up in chair.  Co-ox 63.3 Hgb down slightly to 7.5 from 8.1. WBC 17.8, up from 12.8 Creat up from 1.73 to 1.98 with I/O - 1705 cc yesterday.  LVAD INTERROGATION:  HVAD:  Flow 4.1 liters/min, speed 2600, power 4, peak 7 and trough 3.  Objective:    Vital Signs:   Temp:  [99.1 F (37.3 C)-99.8 F (37.7 C)] 99.1 F (37.3 C) (08/06 0437) Pulse Rate:  [61-68] 63 (08/06 0437) Resp:  [14-29] 14 (08/06 0437) BP: (84-122)/(0-97) 84/0 (08/06 1326) SpO2:  [97 %-100 %] 100 % (08/06 0437) Weight:  [83.7 kg (184 lb 8 oz)] 83.7 kg (184 lb 8 oz) (08/06 0700) Last BM Date: 05/26/17 Mean arterial Pressure 77  Intake/Output:   Intake/Output Summary (Last 24 hours) at 05/26/17 1345 Last data filed at 05/26/17 0654  Gross per 24 hour  Intake                0 ml  Output             1705 ml  Net            -1705 ml     Physical Exam: General:  Looks weak.  No resp difficulty HEENT: normal Cor: distant heart sounds with LVAD hum present. Lungs: clear Incisions healing well Abdomen: soft, nontender, nondistended. Good bowel sounds. Extremities: moderate edema bilaterally Neuro: alert & orientedx3, cranial nerves grossly intact. moves all 4 extremities w/o difficulty. Affect pleasant  Telemetry: sinus 67  Labs: Basic Metabolic Panel:  Recent Labs Lab 05/21/17 0500 05/22/17 0434 05/23/17 0420 05/24/17 0426 05/25/17 0347 05/26/17 0413  NA 134* 138 138 137 135 137  K 3.8 4.3 4.1 3.5 3.6 4.2  CL 109 111 109 110 106 108  CO2 19*  21* 23 24 23 22   GLUCOSE 235* 157* 157* 159* 135* 150*  BUN 49* 50* 47* 46* 44* 45*  CREATININE 2.01* 2.02* 1.78* 1.75* 1.73* 1.98*  CALCIUM 7.9* 7.9* 7.9* 7.6* 7.8* 7.7*  MG 1.8  --  1.7 1.4* 1.7 2.0    Liver Function Tests: No results for input(s): AST, ALT, ALKPHOS, BILITOT, PROT, ALBUMIN in the last 168 hours. No results for input(s): LIPASE, AMYLASE in the last 168 hours. No results for input(s): AMMONIA in the last 168 hours.  CBC:  Recent Labs Lab 05/22/17 0434 05/23/17 0420 05/24/17 0426 05/25/17 0347 05/26/17 0413  WBC 8.9 9.7 12.5* 12.8* 17.8*  NEUTROABS  --   --   --   --  15.8*  HGB 8.5* 8.8* 8.0* 8.1* 7.5*  HCT 26.7* 27.1* 24.7* 25.2* 23.4*  MCV 89.0 89.7 89.2 89.7 90.3  PLT 355 398 288 298 293    INR:  Recent Labs Lab 05/22/17 0434 05/23/17 0420 05/24/17 0426 05/25/17 0347 05/26/17 0413  INR 2.92 2.48 2.40 1.90 2.05   LDH 183  Other results:  EKG:   Imaging:  No results found.   Medications:     Scheduled Medications: . amiodarone  400  mg Oral BID  . amLODipine  10 mg Oral Daily  . febuxostat  120 mg Oral Daily  . feeding supplement (ENSURE ENLIVE)  237 mL Oral TID BM  . hydrALAZINE  100 mg Oral Q8H  . insulin aspart  0-15 Units Subcutaneous TID WC  . isosorbide mononitrate  30 mg Oral Daily  . magnesium oxide  400 mg Oral Daily  . metoCLOPramide (REGLAN) injection  5 mg Intravenous Q6H  . mexiletine  300 mg Oral Q8H  . mirtazapine  15 mg Oral QHS  . pantoprazole  40 mg Oral BID  . potassium chloride  40 mEq Oral Daily  . sodium chloride flush  10-40 mL Intracatheter Q12H  . sotalol  80 mg Oral Daily  . spironolactone  25 mg Oral Daily  . torsemide  40 mg Oral Daily  . warfarin  1 mg Oral ONCE-1800  . Warfarin - Pharmacist Dosing Inpatient   Does not apply q1800     Infusions: . sodium chloride Stopped (05/02/17 0630)  . sodium chloride 10 mL/hr at 05/16/17 1800  . sodium chloride       PRN Medications:  albuterol,  docusate, hydrALAZINE, neomycin-bacitracin-polymyxin, ondansetron (ZOFRAN) IV, oxyCODONE, phenol, sodium chloride flush, traMADol   Assessment/Plan/Discussion:     POD 39s/p HVAD for ischemic cardiomyopathy with acute on chronic systolic heart failure, EF 15% with moderate RV dysfunction. Returned to OR for bleeding from raw mediastinal tissues.  He has been hemodynamically stable off inotropes with fairly stable Co-ox.  Stage 3 CKD: creat up to 1.98 today probably due to diuretic and loose stools with some dehydration.  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 Continuing IV protonix and will maintain INR 2-2.5 without ASA. Hgb down a little today but may be due to blood draws daily, chronic illness. No obvious GI bleeding now.  Acute respiratory failure due to aspiration pneumonia resolved.  Ileus resolved. He does have some nausea since last night. This could be related to something he ate but he is also on Sotalol and amio. Will give him a few doses of Reglan.  INR therapeutic at 2.05. Pharmacy is managing   DM: preop Hgb A1c 5.9. Glucose under good control. ContinueLevemir and SSI.  Gout: observe  Hx of arrhythmias preop on amio and postopsustained VTpossibly due to suction events with diuresis and volume shifts. Stable in sinus on amio, sotalol and mexiletine. ICD in place but will wait to turn on therapies.   Severe malnutrition with prealbumin <5: Encourage PO intake  Physical therapy.   I reviewed the LVAD parameters from today, and compared the results to the patient's prior recorded data. LVAD equipment check completed andis in good working order. Back-up equipment present.     I reviewed the LVAD parameters from today, and compared the results to the patient's prior recorded data.  No programming changes were made.  The LVAD is functioning within specified parameters.  The patient performs  LVAD self-test daily.  LVAD interrogation was negative for any significant power changes, alarms or PI events/speed drops.  LVAD equipment check completed and is in good working order.  Back-up equipment present.   LVAD education done on emergency procedures and precautions and reviewed exit site care.  Length of Stay: [address]  Payton Doughty Cosby Proby 05/26/2017, 1:45 PM

## 2017-05-26 NOTE — Progress Notes (Signed)
ANTICOAGULATION CONSULT NOTE - Follow Up Consult  Pharmacy Consult for Warfarin  Indication: HVAD  No Active Allergies  Patient Measurements: Height: 6' (182.9 cm) Weight: 184 lb 8 oz (83.7 kg) (without VAD) IBW/kg (Calculated) : 77.6  Vital Signs: Temp: 99.1 F (37.3 C) (08/06 0437) Temp Source: Oral (08/06 0437) BP: 94/0 (08/06 1055) Pulse Rate: 63 (08/06 0437)  Labs:  Recent Labs  05/24/17 0426 05/25/17 0347 05/26/17 0413  HGB 8.0* 8.1* 7.5*  HCT 24.7* 25.2* 23.4*  PLT 288 298 293  LABPROT 26.6* 22.1* 23.4*  INR 2.40 1.90 2.05  CREATININE 1.75* 1.73* 1.98*    Estimated Creatinine Clearance: 46.8 mL/min (A) (by C-G formula based on SCr of 1.98 mg/dL (H)).   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 He developed an ileus on 7/5, warfarin was held, and heparin bridge started once INR fell below 1.8.  Heparin stopped 7/10 with INR 2.19. On 7/13, Hgb dropped and he had melena - warfarin held again and bridged with heparin while INR < 1.8.  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   Warfarin resumed 7/18 without heparin bridge.  CBC stable today with hgb of 8.1, no overt bleeding noted. Diet has been advancing, and is also receiving supplements.   INR back in goal range today.  Amiodarone likely influencing warfarin sensitivity.  Goal of Therapy:  INR 2-2.5 Monitor platelets by anticoagulation protocol: Yes   Plan:  1) Warfarin 1 mg x 1 tonight. 2) Daily PT/INR.  Tad MooreJessica Gurney Balthazor, Pharm D, BCPS  Clinical Pharmacist Pager [phone]  05/26/2017 1:17 PM

## 2017-05-26 NOTE — Progress Notes (Signed)
LVAD Inpatient Coordinator Rounding Note:  Admitted 04/08/2017 due to A/C Heart failure.   [employer] LVAD implanted on 04/07/2017 by Dr. Laneta SimmersBartle as DT VAD.  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 He had epinephrine, APC, and 2 clips.   Vital signs: HR: 67 Doppler: 95 Auto BP: 94/71 (79) O2 Sat: 100% on RA Wt:207>209 > 213 >213 > 208>203>220>227lbs...........234>233>240>240>236>239>238>240>241>249>248>240>230>226>184(not performed standing)  LVAD interrogation reveals:  Speed: 2600 Flow: 4.1 Power: 4w Alarms:none Peak: 7.1 Trough: 2.0 HCT: 27 Low flow alarm setting: 2.5 High watt alarm setting: 6   Suction: on  Lavare cycle: on  Blood Products: 6/28> 5 PRBC's, 6 FFP 7/1> 1 PRBC 7/9> 1 PRBC 7/13>3 PRBC 7/14>3 PRBC 7/15> 1 PRBC 7/16>3PRBC 7/17> 3 PRBC 7/20> 1 PRBC  Gtts: Milrinone restarted 7/8, stopped 7/16 Levo stopped 04/26/17 restarted 14-Jul-2017 - stopped 05/07/17 Amiodarone stopped 04/29/17, restarted 7/17 - transitioned to PO 05/13/17; VT on 05/15/17 - IV amio re-started - 30 mg/hr stopped 05/16/17 Heparin stopped 04/29/17 Lidocaine 05/07/17 - stopped 7/19  TPN - started 04/24/17 for nutritional support, stopped 04/30/17  Arrhythmia: 04/24/17 - Afib with RVR - started amiodarone 05/02/17 - 2 sustained episodes of VT - cardioverted x 1; second broke with overdrive pacing 7/17- wide complex tachycardia- Amio bolus x3 and gtt at 60 mg/hr 05/07/17 - sustained VT - converted with overdrive pacing; Lidocaine started in addition to amiodarone 05/08/17 - Lidocaine stopped due to confusion (lido level 12.4) 05/14/17 - sustained VT - converted 3 separate occassions with overdrive pacing 05/19/17 - sustained VT-converted with overdrive pacing  05/26/17- Afib rate in the 50's  Respiratory: 04/23/17 - re-intubated due to respiratory failure secondary to suspected aspiration pneumonia 04/25/17-extubated 14-Jul-2017-  intubated for EGD 14-Jul-2017- extubated  Drive Line: Daily Dressing Kits with Aquacell AG silver strips per protocol.   Labs:  LDH trend:160 (pre VAD)>227>215......[id number]>183>161>164>184>195>190>188>187>205>212>185>167>167>199>165>151>159>163>181  INR trend: 1.31>1.45>1.44>2.09>2.28>3.58>3.94>3.22>2.17>1.74>1.7>2.24>2.68>2.33>2.15>2.25>2.11>2.13>2.45>2.25>2.64>2.83>2.92>2.48>2.05  Anticoagulation Plan: -INR Goal: 2-2.5  -ASA Dose: 325 mg- on hold  Adverse Events on VAD: - [facility] - 04/23/17 Ileus with vomiting; re-intubation for acute respiratory failure; probable aspiration pneumonia -7/13-GI bleed - 7/19 - Lidocaine gtt stopped due to confusion (lido toxicity)    Plan/Recommendations:  1. Continue daily drive line dressing changes with patient's wife.  2. Please call VAD pager with patient and equipment concerns.. 3. [facility] this [facility]. 4. [facility] SailsHarry [facility] complete VAD test this evening at 5pm. 5. Please mobilize the patient as much as possible.  James ScottLesley Carma Dwiggins RN, VAD Coordinator 24/7 pager [phone]

## 2017-05-26 NOTE — Progress Notes (Signed)
Nutrition Follow Up   DOCUMENTATION CODES:   Severe malnutrition in context of chronic illness  INTERVENTION:    Continue Ensure Enlive po TID, each supplement provides 350 kcal and 20 grams of protein  NUTRITION DIAGNOSIS:   Malnutrition (severe) related to chronic illness (CHF) as evidenced by severe depletion of body fat, severe depletion of muscle mass, ongoing  GOAL:   Patient will meet greater than or equal to 90% of their needs, progressing  MONITOR:   PO intake, Supplement acceptance, Labs, Weight trends, Skin, I & O's  ASSESSMENT:   56 yo male with hx of HTN, gout, obesity, chronic edema, CAD, ischemic cardiomyopathy, DM2, AICD, MI, CHF, renal insufficiency who was admitted on 6/22 with acute on chronic systolic heart failure.  6/28  HVAD placement 6/29  Extubated 7/01  Soft diet started, Ensure Enlive added 7/04  Re-intubated 7/05  TPN started 7/06  Extubated 7/11  TPN D/C'd  Pt continues on a Soft diet.  PO intake decreased since last RD follow up. Noted having some nausea. PO intake 25-50% per flowsheet records. Receiving Ensure Enlive TID. Medications reviewed and include Mag-Ox & Coumadin. Labs reviewed. CBG's [phone].  Diet Order:  DIET SOFT Room service appropriate? Yes; Fluid consistency: Thin  Skin:  LLE posterior calf >> severe tophaceous gout, with crystals that are exposed  Last BM:  8/6  Height:   Ht Readings from Last 1 Encounters:  05/11/2017 6' (1.829 m)    Weight:   Wt Readings from Last 1 Encounters:  05/26/17 184 lb 8 oz (83.7 kg)    Ideal Body Weight:  80.9 kg  BMI:  Body mass index is 25.02 kg/m.  Estimated Nutritional Needs:   Kcal:  2100-2300  Protein:  120-140 gm  Fluid:  2.1-2.3 L  EDUCATION NEEDS:   No education needs identified at this time  Maureen ChattersKatie Makalynn Berwanger, RD, LDN Pager #: [phone] After-Hours Pager #: [phone]

## 2017-05-26 NOTE — Progress Notes (Signed)
Patient ID: James [name], male   DOB: 05/28/1961, 56 y.o.   MRN: [id number]   Advanced Heart Failure VAD Team Note  Subjective:    Events: --HVAD placed 6/28.  Returned to the OR that evening with high chest tube output, evacuation of mediastinal hematoma.  --Extubated 6/29. Milrinone stopped on 7/4. --7/4, patient developed ileus with respiratory compromise. NGT placed with 3 L suctioned out.  CXR with suspicion for aspiration PNA.  Patient had to be intubated.  He went into atrial fibrillation with RVR.  He became hypotensive and was started on norepinephrine and phenylephrine.  Amiodarone gtt begun.    --Extubated again on 7/6.  -- 7/13 Had 2 sustained episodes of VT. Had to be cardioverted 1. Second episode broke with overdrive pacing with Dr. Graciela Husbands. VAD speed turned down to 2700.  --Melena due to acuteGI bleed --> 2 units PRBCs 7/14, 2 units 7/15. 3 U PRBCs 7/16,  05/08/2017 3 UPRBCs.  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  He had epinephrine, APC, and 2 clips. Intubated prior to procedure and placed on norepinephrine. Speed dropped to 2660.  --7/18 Back in VT with overdrive pacing. Lidocaine was started in addition to amiodarone. Extubated. --7/19 lidocaine stopped with confusion. Neuro consulted.  EEG normal.  CT of head no acute findings. 7/20 confusion had resolved.  --1 unit PRBCs 7/20.  --7/22, developed sustained VT with rate around 130 shortly after getting IV Lasix.  We were able to pace him out and back to NSR.  He was put back on amiodarone gtt.  --More VT on 7/25, paced out.  Back on IV amiodarone gtt.  NSR.  -- 7/31 Ramp ECHO decrease speed 2600 rpm.  --8/1 VT- paced out --8/2 VT - started on sotalol --8/6 Hgb 7.5    Had nausea this morning but resolved with zofran.   Hgb down to 7.5. No obvious source of bleeding. Overall feeling ok. Denies SOB.   HVAD INTERROGATION:  HVAD:  Flow 4.1 liters/min, speed 2600,  power 4  W,  Peak 7 Trough 3.6  Suction On. Lavare On. No alarms.    Objective:    Vital Signs:   Temp:  [99.1 F (37.3 C)-99.8 F (37.7 C)] 99.1 F (37.3 C) (08/06 0437) Pulse Rate:  [61-68] 63 (08/06 0437) Resp:  [14-29] 14 (08/06 0437) BP: (94-122)/(0-97) 94/0 (08/06 1055) SpO2:  [97 %-100 %] 100 % (08/06 0437) Weight:  [184 lb 8 oz (83.7 kg)] 184 lb 8 oz (83.7 kg) (08/06 0700) Last BM Date: 05/25/17 Mean arterial Pressure 70-92s  Intake/Output:   Intake/Output Summary (Last 24 hours) at 05/26/17 1148 Last data filed at 05/26/17 0654  Gross per 24 hour  Intake                0 ml  Output             1705 ml  Net            -1705 ml     Physical Exam     CVP 7-8 Physical Exam: GENERAL:NAD. In the chair.  HEENT: normal  NECK: Supple, JVP 7-8 .  2+ bilaterally, no bruits.  No lymphadenopathy or thyromegaly appreciated.   CARDIAC:  Mechanical heart sounds with LVAD hum present.  LUNGS:  Clear to auscultation bilaterally.  ABDOMEN:  Soft, round, nontender, positive bowel sounds x4.     LVAD exit site:   Dressing dry and intact.  No erythema  or drainage.  Stabilization device present and accurately applied.  Driveline dressing is being changed daily per sterile technique. EXTREMITIES:  Warm and dry, no cyanosis, clubbing, rash. R and LLE 2+ edema  NEUROLOGIC:  Alert and oriented x 4.  Gait steady.  No aphasia.  No dysarthria.  Affect pleasant.      Telemetry   Personally reviewed NSR? A fib 60s   Labs   Basic Metabolic Panel:  Recent Labs Lab 05/21/17 0500 05/22/17 0434 05/23/17 0420 05/24/17 0426 05/25/17 0347 05/26/17 0413  NA 134* 138 138 137 135 137  K 3.8 4.3 4.1 3.5 3.6 4.2  CL 109 111 109 110 106 108  CO2 19* 21* 23 24 23 22   GLUCOSE 235* 157* 157* 159* 135* 150*  BUN 49* 50* 47* 46* 44* 45*  CREATININE 2.01* 2.02* 1.78* 1.75* 1.73* 1.98*  CALCIUM 7.9* 7.9* 7.9* 7.6* 7.8* 7.7*  MG 1.8  --  1.7 1.4* 1.7 2.0    Liver Function Tests: No results for  input(s): AST, ALT, ALKPHOS, BILITOT, PROT, ALBUMIN in the last 168 hours. No results for input(s): LIPASE, AMYLASE in the last 168 hours. No results for input(s): AMMONIA in the last 168 hours.  CBC:  Recent Labs Lab 05/22/17 0434 05/23/17 0420 05/24/17 0426 05/25/17 0347 05/26/17 0413  WBC 8.9 9.7 12.5* 12.8* 17.8*  NEUTROABS  --   --   --   --  15.8*  HGB 8.5* 8.8* 8.0* 8.1* 7.5*  HCT 26.7* 27.1* 24.7* 25.2* 23.4*  MCV 89.0 89.7 89.2 89.7 90.3  PLT 355 398 288 298 293    INR:  Recent Labs Lab 05/22/17 0434 05/23/17 0420 05/24/17 0426 05/25/17 0347 05/26/17 0413  INR 2.92 2.48 2.40 1.90 2.05    Other results:     Imaging   No results found.   Medications:     Scheduled Medications: . amiodarone  400 mg Oral BID  . amLODipine  10 mg Oral Daily  . febuxostat  120 mg Oral Daily  . feeding supplement (ENSURE ENLIVE)  237 mL Oral TID BM  . hydrALAZINE  100 mg Oral Q8H  . insulin aspart  0-15 Units Subcutaneous TID WC  . isosorbide mononitrate  30 mg Oral Daily  . magnesium oxide  400 mg Oral Daily  . mexiletine  300 mg Oral Q8H  . mirtazapine  15 mg Oral QHS  . pantoprazole  40 mg Oral BID  . potassium chloride  40 mEq Oral Daily  . sodium chloride flush  10-40 mL Intracatheter Q12H  . sotalol  80 mg Oral Daily  . spironolactone  25 mg Oral Daily  . torsemide  40 mg Oral Daily  . Warfarin - Pharmacist Dosing Inpatient   Does not apply q1800    Infusions: . sodium chloride Stopped (05/02/17 0630)  . sodium chloride 10 mL/hr at 05/16/17 1800    PRN Medications: albuterol, docusate, hydrALAZINE, neomycin-bacitracin-polymyxin, ondansetron (ZOFRAN) IV, oxyCODONE, phenol, sodium chloride flush, traMADol   Patient Profile   56 yo with CAD s/p CABG, ischemic cardiomyopathy/chronic systolic CHF, tophaceous gout, and CKD stage 3 was admitted for diuresis and consideration for LVAD placement. S/p HVAD on 6/28  Assessment/Plan:    1. Acute/chronic  systolic CHF s/p HVAD: Ischemic cardiomyopathy.  St Jude ICD.  Echo (6/18) with EF 15%, mildly dilated RV with moderately decreased systolic function.  s/p HVAD placement 6/28 and had to return to OR to evacuate mediastinal hematoma.  He had been weaned off pressors/milrinone,  but developed ileus w/ likely aspiration event 7/4, re-intubated, developed afib/RVR requiring norepinephrine. Extubated 7/6.  VAD speed turned down to 2660 with VT. VAD parameters currently look good.  Milrinone stopped 7/17 early am with recurrent VT.  Now off norepinephrine.   Maps ok.  Todays CO-OX 63%.  Complicated with RV failure and VT.  - Continue torsemide 40 mg daily. Weight not accurate.  Continue spironolactone 25 daily.  - MAP overall better.  Continue amlodipine 10 mg daily. Continue hydralazine to 100 mg three times a day, imdur 60 mg daily.  - Off ASA with GI Bleed - INR 2.05 , Goal 2-2.5.  - Has been off dig/arb with elevated creatinine.     2. AKI on CKD stage 3: Suspect a component of peri-op ATN, worsened with ileus, vomiting, intubation/sedation. Creatinine up 1.9 . Watch closely  3. Symptomatic anemia due to acute UGI bleeding:  Received 3 units PRBCs 7/16 and 3 units PRBCs on 05/16/2017. 1 unit PRBCs 7/20. 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  Requiring 2 clips + epi + APC. Does not appear to be bleeding actively now.  - Got Feraheme 7/28.  - Continue Protonix 40 po bid. - Back on warfarin, aiming for INR 2-2.5 range.  Will leave off aspirin for now. Dosing per pharm. - Transfuse hgb < 8 => Hgb 7.5. Give 1UPRBCs. No obvious source.  4. Ventricular tachycardia: Status post ventricular tachycardia 2 on 7/13. Possible suction event. VAD speed turned down to 2700.  Recurrent VT 7/17. EP called to bedside. Overdrive pacing successful for about 10 minutes but VT recurred.  Milrinone turned off, remained in VT overnight but hemodynamically stable.  Speed decreased to 2660.   On 7/18, we were able to pace him out of VT.  Lidocaine added then stopped due to confusion/mental status changes.  He had VT 7/22 about 30 minutes after getting IV Lasix, had to be paced out again => may have been due to rapid fluid shift.  Pt paced out of VT 3 separate occasions 7/25. Got amio bolus x 2 and started back on IV infusion.  Transitioned to amiodarone 400 mg tid.  On 8/1 had recurrent VT and paced out again.  Recurrent VT 8/2 per EP sotalol started along with amiodarone, sotalol decreased to once daily with bradycardia.  No VT - Continue mexiletine up to 300 mg every 8 hrs .    - Continue sotalol 80 mg daily. - Decreased amiodarone to 400 mg bid.   5. CAD: s/p CABG 2012. No S/S ischemia.  Off aspirin due to GI bleeding. No change.   6. Gout: Severe tophaceous gout. Left shoulder pain ?gout.   - Completed 3 days of prednisone.   - Continue uloric.  7. Atrial fibrillation: Developed atrial fibrillation with RVR in setting of aspiration PNA on 7/4.  ?Atrial fibrillation today.  Possible A fib  Hard to know with interference from HMIII.   8. Malnutrition: Now off TPN. Per Speech- FEEs performed. Prealbumin very low. Nutrition recommendations appreciated.  Continue supplements. Appetite improving.  9. Aspiration PNA with acute hypoxemic respiratory failure on 7/4: He was extubated on 7/6. Tracheal aspirate with Klebsiella and citrobacter. Both sensitive to Zosyn. Completed abx 7/13. No s/s active infection 10. Ileus: Resolved.  11. Deconditioning:  Plan for CIR when stable. OT/PT appreciated.   12. Acute Respiratory Failure: Intubated 7/17 in setting of complex GI intervention. Extubated 7/18. Resolved on room air.   13. Delirium:  Resolved. No further. Appreciate neuro input Possibly related to lidocaine.  Lidocaine stopped. Confusion resolved. CT of head negative. EEG ok. No further workup per neuro. No change today.  15. Insomnia: On remeron for sleep and depression. 16. Post-op  depression: Remeron started 7/28.   Looks ok today. Hgb down but without obvious source. Check CBC in am. Consult CIR today.   I reviewed the HVAD parameters from today, and compared the results to the patient's prior recorded data.  No programming changes were made.  The HVAD is functioning within specified parameters.    CIR co nsult.   Tonye BecketAmy Clegg, NP 05/26/2017, 11:48 AM  VAD Team --- VAD ISSUES ONLY--- Pager [phone] (7am - 7am) Advanced Heart Failure Team  Pager [phone] (M-F; 7a - 4p)  Please contact [facility] for night-coverage after hours (4p -7a ) and weekends on [email or web address]  Patient seen with NP, agree with the above note.  He had profuse diarrhea overnight, has now resolved.  He remains somewhat nauseated.  Feels more weak than yesterday and did not walk with PT.  Hgb down to 7.5.   I will transfuse 1 unit today.  Do not think this is active bleeding, think chronic disease, blood draws, etc.   Markedly weak, think due to deconditioning over very long and complicated hospitalization.  Needs aggressive PT, CIR evaluating today, hope to move there soon.   Diarrhea resolved, ?related to dinner last night, started soon after a blackberry cobbler.  Ongoing nausea.  He has been on high dose amiodarone for a long time now, now also on sotalol.  Will decrease amiodarone to 200 mg bid.   Creatinine up a bit today but had diarrhea.  To get volume with blood.  If creatinine higher again, can decrease tomorrow's torsemide to 20 mg daily.   Marca AnconaDalton Adriona Kaney 05/26/2017 4:15 PM

## 2017-05-27 ENCOUNTER — Inpatient Hospital Stay (HOSPITAL_COMMUNITY): Payer: Medicare PPO

## 2017-05-27 DIAGNOSIS — G9340 Encephalopathy, unspecified: Secondary | ICD-10-CM

## 2017-05-27 DIAGNOSIS — R410 Disorientation, unspecified: Secondary | ICD-10-CM

## 2017-05-27 LAB — CBC
HCT: 29.2 % — ABNORMAL LOW (ref 39.0–52.0)
HEMOGLOBIN: 9.5 g/dL — AB (ref 13.0–17.0)
MCH: 29.4 pg (ref 26.0–34.0)
MCHC: 32.5 g/dL (ref 30.0–36.0)
MCV: 90.4 fL (ref 78.0–100.0)
Platelets: 289 10*3/uL (ref 150–400)
RBC: 3.23 MIL/uL — AB (ref 4.22–5.81)
RDW: 18.7 % — ABNORMAL HIGH (ref 11.5–15.5)
WBC: 15.2 10*3/uL — ABNORMAL HIGH (ref 4.0–10.5)

## 2017-05-27 LAB — MAGNESIUM: Magnesium: 1.9 mg/dL (ref 1.7–2.4)

## 2017-05-27 LAB — URINALYSIS, ROUTINE W REFLEX MICROSCOPIC
BILIRUBIN URINE: NEGATIVE
Glucose, UA: NEGATIVE mg/dL
KETONES UR: NEGATIVE mg/dL
NITRITE: NEGATIVE
PROTEIN: NEGATIVE mg/dL
SPECIFIC GRAVITY, URINE: 1.015 (ref 1.005–1.030)
pH: 5.5 (ref 5.0–8.0)

## 2017-05-27 LAB — PROTIME-INR
INR: 1.93
PROTHROMBIN TIME: 22.4 s — AB (ref 11.4–15.2)

## 2017-05-27 LAB — COOXEMETRY PANEL
CARBOXYHEMOGLOBIN: 2.3 % — AB (ref 0.5–1.5)
Methemoglobin: 0.9 % (ref 0.0–1.5)
O2 Saturation: 96.1 %
Total hemoglobin: 9.6 g/dL — ABNORMAL LOW (ref 12.0–16.0)

## 2017-05-27 LAB — GLUCOSE, CAPILLARY
GLUCOSE-CAPILLARY: 139 mg/dL — AB (ref 65–99)
GLUCOSE-CAPILLARY: 82 mg/dL (ref 65–99)
GLUCOSE-CAPILLARY: 82 mg/dL (ref 65–99)
Glucose-Capillary: 79 mg/dL (ref 65–99)

## 2017-05-27 LAB — BASIC METABOLIC PANEL
Anion gap: 9 (ref 5–15)
BUN: 47 mg/dL — AB (ref 6–20)
CHLORIDE: 106 mmol/L (ref 101–111)
CO2: 22 mmol/L (ref 22–32)
Calcium: 8 mg/dL — ABNORMAL LOW (ref 8.9–10.3)
Creatinine, Ser: 2.04 mg/dL — ABNORMAL HIGH (ref 0.61–1.24)
GFR calc Af Amer: 41 mL/min — ABNORMAL LOW (ref >60)
GFR calc non Af Amer: 35 mL/min — ABNORMAL LOW (ref >60)
Glucose, Bld: 145 mg/dL — ABNORMAL HIGH (ref 65–99)
POTASSIUM: 4.2 mmol/L (ref 3.5–5.1)
SODIUM: 137 mmol/L (ref 135–145)

## 2017-05-27 LAB — TYPE AND SCREEN
ABO/RH(D): A POS
Antibody Screen: NEGATIVE
Unit division: 0

## 2017-05-27 LAB — BPAM RBC
Blood Product Expiration Date: 201808272359
ISSUE DATE / TIME: 201808061719
UNIT TYPE AND RH: 6200

## 2017-05-27 LAB — URINALYSIS, MICROSCOPIC (REFLEX)

## 2017-05-27 LAB — LACTATE DEHYDROGENASE: LDH: 219 U/L — AB (ref 98–192)

## 2017-05-27 LAB — TSH: TSH: 9.45 u[IU]/mL — AB (ref 0.350–4.500)

## 2017-05-27 LAB — AMMONIA: AMMONIA: 20 umol/L (ref 9–35)

## 2017-05-27 MED ORDER — WARFARIN SODIUM 2 MG PO TABS
2.0000 mg | ORAL_TABLET | Freq: Once | ORAL | Status: AC
  Administered 2017-05-27: 2 mg via ORAL
  Filled 2017-05-27: qty 1

## 2017-05-27 MED ORDER — DEXTROSE 5 % IV SOLN
1.0000 g | INTRAVENOUS | Status: DC
  Administered 2017-05-27: 1 g via INTRAVENOUS
  Filled 2017-05-27 (×2): qty 10

## 2017-05-27 MED ORDER — GLUCERNA 1.2 CAL PO LIQD
1000.0000 mL | ORAL | Status: DC
  Administered 2017-05-27 (×2): 1000 mL
  Filled 2017-05-27 (×4): qty 1000

## 2017-05-27 MED ORDER — AMIODARONE HCL 200 MG PO TABS
200.0000 mg | ORAL_TABLET | Freq: Two times a day (BID) | ORAL | Status: DC
  Administered 2017-05-27 – 2017-05-29 (×5): 200 mg via ORAL
  Filled 2017-05-27 (×5): qty 1

## 2017-05-27 MED ORDER — MEXILETINE HCL 150 MG PO CAPS
300.0000 mg | ORAL_CAPSULE | Freq: Two times a day (BID) | ORAL | Status: DC
  Administered 2017-05-27 – 2017-05-29 (×4): 300 mg via ORAL
  Filled 2017-05-27 (×6): qty 2

## 2017-05-27 MED ORDER — JEVITY 1.2 CAL PO LIQD: 1000.0000 mL | ORAL | Status: DC

## 2017-05-27 MED ORDER — MAGNESIUM SULFATE 2 GM/50ML IV SOLN
2.0000 g | Freq: Once | INTRAVENOUS | Status: AC
  Administered 2017-05-27: 2 g via INTRAVENOUS
  Filled 2017-05-27: qty 50

## 2017-05-27 NOTE — Progress Notes (Signed)
CSW met with wife at bedside. Wife shared frustrations with long hospitalizations and multiple set backs. 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 Wife states "I feel like I will miss a lot of communication if I am not present". CSW discussed team members contacting her phone to give reports to allow her to maintain communication and be present for the first day of school. CSW spoke with HF NP and VAD Coordinators who will assist in communication with wife and keep her in the loop of patient's progress throughout the day. VAD Coordinator will pass on to Nursing Director as well. Wife grateful for communication and anticipated updates tomorrow. CSW will continue to provide supportive intervention to wife and patient. Raquel Sarna, [place], [place]

## 2017-05-27 NOTE — Progress Notes (Signed)
[facility]     PROGRESS NOTE  Subjective/Complaints:  Subjective:  Pt seen sitting up in his chair.  He notes he slept well and is hopeful to come to rehab. Later was noted to have significant cognitive decline and Neurology consulted with ongoing workup.   ROS: +SOB, weakness.  Denies CP, N/V/D.  Objective: Vital Signs: Blood pressure 101/75, pulse (!) 51, temperature 98.2 F (36.8 C), temperature source Oral, resp. rate (!) 33, height 6' (1.829 m), weight 101.6 kg (224 lb), SpO2 97 %. Ct Head Wo Contrast  Result Date: 05/27/2017 CLINICAL DATA:  56 year old male with episode of sudden onset decreased level of consciousness today. Improved mental status now. EXAM: CT HEAD WITHOUT CONTRAST TECHNIQUE: Contiguous axial images were obtained from the base of the skull through the vertex without intravenous contrast. COMPARISON:  05/08/2017. FINDINGS: Brain: Mild scaphocephaly. No midline shift, ventriculomegaly,evidence of mass lesion or evidence of cortically based acute infarction. No hyperdense intracranial hemorrhage, but suggestion of stable bilateral hemisphere extra-axial collections measuring 3-4 mm bilaterally. See coronal images 32 and 33. These appear unchanged except for possible increased density. No significant mass effect. Gray-white matter differentiation appears stable in both hemispheres, with patchy cerebral white matter hypodensity, and probable small chronic lacunar infarct in the right thalamus. Stable small chronic lacunar infarct in the left cerebellar hemisphere (coronal image 58), but increased hypodensity in the right superior cerebellum suggesting interval lacunar infarcts in the right SCA territory. These have a chronic appearance today. Vascular: Calcified atherosclerosis at the skull base. No suspicious intracranial vascular hyperdensity. Skull: Stable.  No acute osseous abnormality identified. Sinuses/Orbits: Clear aside from continued mild  mucosal thickening in the left maxillary sinus, now with some bubbly opacity. Other: No acute orbit or scalp soft tissue findings. IMPRESSION: 1. Small bilateral subdural hygromas or hematomas, but not significantly changed since July and clinical significance unclear. 2. Right superior cerebellar artery lacunar infarcts have become apparent since 05/08/2017, but have a chronic appearance today. 3. 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 4. In light of #1 and #2, follow-up brain MRI may be valuable. Electronically Signed   By: Odessa FlemingH  Hall M.D.   On: 05/27/2017 11:25   Dg Chest Port 1 View  Result Date: 05/27/2017 CLINICAL DATA:  Dyspnea. EXAM: PORTABLE CHEST 1 VIEW COMPARISON:  Radiographs May 18, 2017. FINDINGS: Stable cardiomediastinal silhouette. Surgical staples are noted. Stable position of left ventricular assistance device. Single lead left-sided pacemaker is unchanged in position. Elevated left hemidiaphragm is again noted. Right-sided PICC line is unchanged in position. No pneumothorax or pleural effusion is noted. No acute pulmonary disease is noted. Bony thorax is unremarkable. IMPRESSION: Stable support apparatus as described above. No acute abnormality noted currently. Electronically Signed   By: Lupita RaiderJames  Green Jr, M.D.   On: 05/27/2017 09:29    Recent Labs  05/26/17 0413 05/27/17 0419  WBC 17.8* 15.2*  HGB 7.5* 9.5*  HCT 23.4* 29.2*  PLT 293 289    Recent Labs  05/26/17 0413 05/27/17 0419  NA 137 137  K 4.2 4.2  CL 108 106  GLUCOSE 150* 145*  BUN 45* 47*  CREATININE 1.98* 2.04*  CALCIUM 7.7* 8.0*   CBG (last 3)   Recent Labs  05/26/17 1616 05/26/17 2103 05/27/17 0559  GLUCAP 125* 192* 139*    Wt Readings from Last 3 Encounters:  05/27/17 101.6 kg (224 lb)  04/08/17 97.8 kg (215 lb 9.6 oz)  03/24/17 92.5 kg (204 lb)    Physical Exam:  BP 101/75   Pulse (!) 51   Temp 98.2 F (36.8 C) (Oral)   Resp (!) 33    Ht 6' (1.829 m)   Wt 101.6 kg (224 lb)   SpO2 97%   BMI 30.38 kg/m  Constitutional:  He appears well-developed. Cachectic  HENT: Normocephalic and atraumatic.  Eyes: EOM are normal. No discharge.  Cardiovascular: Normal rate and regular rhythm.  No JVD. Respiratory: +DOE. +[place]. Decreased breath sounds but clear to auscultation  GI: Soft. Bowel sounds are normal.  Musculoskeletal: He exhibits edema. He exhibits no tenderness.  Tophi on b/l hands Neurological: He is alert and oriented.  Motor: UE: 3+/5 proxiaml to distal B/l LE: HF 2/5, KE 2+/5, ADF/PF 3-/5 Sensation intact to light touch  Skin: Skin is warm and dry. Sternal incision c/d/i  Psychiatric: He has a normal mood and affect. His behavior is normal.    Assessment/Plan: 1. Debilation  Cont therapies  Neurology workup ongoing  2. Gout  Monitor for flares  3. Combined CHF with AICD  Monitor in accordance with increased physical activity and avoid UE resistance excercises  Daily weight  Cont LVAD  4. CAD with CABG  Cont meds  5. CKD  Avoid nephrotoxic meds  6. Diabetes mellitus   Monitor in accordance with exercise and adjust meds as necessary  7. Atrial fibrillation with RVR   Monitor RR with increased physical activity  LOS (Days) 46 A FACE TO FACE EVALUATION WAS PERFORMED  Haruo Stepanek Karis Juba 05/27/2017 12:23 PM

## 2017-05-27 NOTE — Progress Notes (Signed)
OT Cancellation Note  Patient Details Name: James [name] MRN: [id number] DOB: [birth year]   Cancelled Treatment:    Reason Eval/Treat Not Completed: Medical issues which prohibited therapy.  Pt with decreased arousal, following only occasional commands - undergoing neurology work up.  Will reattempt.  James [name], OTR/L [phone]   James [name], James [name] 05/27/2017, 11:45 AM

## 2017-05-27 NOTE — Progress Notes (Addendum)
Inpatient Rehabilitation  Rehab MD was to see today for re-consult/addendum in order to initiate insurance authorization for hopeful IP Rehab admission.  However, note medical changes with work-up ongoing at this time.  Plan to continue to follow at a distance for medical readiness.  Please call with questions.   Charlane FerrettiMelissa Zonia Caplin, M.A., CCC/SLP Admission Coordinator  [facility]  Cell [phone]

## 2017-05-27 NOTE — Progress Notes (Signed)
LVAD Inpatient Coordinator Rounding Note:  Admitted 03/22/2017 due to A/C Heart failure.   [employer] LVAD implanted on 03-04-17 by Dr. Laneta SimmersBartle as DT VAD.   Vital signs: HR: 64 Doppler: 74 Auto BP: 101/75 O2 Sat: 100% on RA Wt:207>209 > 213 >213 > 208>203>220>227lbs...........234>233>240>240>236>239>238>240>241>249>248>240>230>226>184(not performed standing)>224  LVAD interrogation reveals:  Speed: 2600 Flow: 4.9 Power: 3.9w Alarms:none Peak: 7.4 Trough: 3.1 HCT: 29- adjusted today Low flow alarm setting: 2.5 High watt alarm setting: 6   Suction: on  Lavare cycle: on  Blood Products: 6/28> 5 PRBC's, 6 FFP 7/1> 1 PRBC 7/9> 1 PRBC 7/13>3 PRBC 7/14>3 PRBC 7/15> 1 PRBC 7/16>3PRBC 7/17> 3 PRBC 7/20> 1 PRBC 8/6> 1 PRBC  Gtts: Milrinone restarted 7/8, stopped 7/16 Levo stopped 04/26/17 restarted 04/29/2017 - stopped 05/07/17 Amiodarone stopped 04/29/17, restarted 7/17 - transitioned to PO 05/13/17; VT on 05/15/17 - IV amio re-started - 30 mg/hr stopped 05/16/17 Heparin stopped 04/29/17 Lidocaine 05/07/17 - stopped 7/19  TPN - started 04/24/17 for nutritional support, stopped 04/30/17  Arrhythmia: 04/24/17 - Afib with RVR - started amiodarone 05/02/17 - 2 sustained episodes of VT - cardioverted x 1; second broke with overdrive pacing 7/17- wide complex tachycardia- Amio bolus x3 and gtt at 60 mg/hr 05/07/17 - sustained VT - converted with overdrive pacing; Lidocaine started in addition to amiodarone 05/08/17 - Lidocaine stopped due to confusion (lido level 12.4) 05/14/17 - sustained VT - converted 3 separate occassions with overdrive pacing 05/19/17 - sustained VT-converted with overdrive pacing  05/26/17- Afib rate in the 50's   Respiratory: 04/23/17 - re-intubated due to respiratory failure secondary to suspected aspiration pneumonia 04/25/17-extubated 05/17/2017- intubated for EGD 05/08/2017- extubated  Drive Line: Daily Dressing Kits with Aquacell AG silver strips per  protocol.   Labs:  LDH trend:160 (pre VAD)>227>215......200>183>161>164>184>195>190>188>187>205>212>185>167>167>199>165>151>159>163>181>219  INR trend: 1.31>1.45>1.44>2.09>2.28>3.58>3.94>3.22>2.17>1.74>1.7>2.24>2.68>2.33>2.15>2.25>2.11>2.13>2.45>2.25>2.64>2.83>2.92>2.48>2.05>1.93  Anticoagulation Plan: -INR Goal: 2-2.5  -ASA Dose: 325 mg- on hold  Adverse Events on VAD: - [facility], [facility] of mediastinal hematoma - 04/23/17 Ileus with vomiting; re-intubation for acute respiratory failure; probable aspiration pneumonia -7/13-GI bleed - 7/17 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 He had epinephrine, APC, and 2 clips.  - 7/19 - Lidocaine gtt stopped due to confusion (lido toxicity) -8/7- Neurology consult- lethargy, mouth tremors, not vocal, not following commands> Head CT and EEG, blood and urine cultures sent, Ammonia-20   Plan/Recommendations:  1. Continue daily drive line dressing changes with patient's wife.  2. Please call VAD pager with patient and equipment concerns.. 3. Neurology consult today.  Marcellus ScottLesley Wilson RN, VAD Coordinator 24/7 pager [phone]

## 2017-05-27 NOTE — Progress Notes (Signed)
ANTICOAGULATION CONSULT NOTE - Follow Up Consult  Pharmacy Consult for Warfarin  Indication: HVAD  No Active Allergies  Patient Measurements: Height: 6' (182.9 cm) Weight: 224 lb (101.6 kg) IBW/kg (Calculated) : 77.6  Vital Signs: Temp: 98.8 F (37.1 C) (08/07 0536) Temp Source: Axillary (08/07 0536) BP: 101/75 (08/07 0619) Pulse Rate: 51 (08/07 0825)  Labs:  Recent Labs  05/25/17 0347 05/26/17 0413 05/27/17 0419  HGB 8.1* 7.5* 9.5*  HCT 25.2* 23.4* 29.2*  PLT 298 293 289  LABPROT 22.1* 23.4* 22.4*  INR 1.90 2.05 1.93  CREATININE 1.73* 1.98* 2.04*    Estimated Creatinine Clearance: 51.1 mL/min (A) (by C-G formula based on SCr of 2.04 mg/dL (H)).   Assessment: James [name] s/p HVAD 6/28, started on warfarin per MD 6/30. He developed an ileus on 7/5, warfarin was held, and heparin bridge started once INR fell below 1.8.  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    Warfarin resumed 7/18 without heparin bridge.  Hgb up to 9 this morning, no overt bleeding noted.   INR just below goal today at 1.9.  Amiodarone likely influencing warfarin sensitivity.  Goal of Therapy:  INR 2-2.5 Monitor platelets by anticoagulation protocol: Yes   Plan:  1) Warfarin 2 mg x 1 tonight. 2) Daily PT/INR.  Sheppard CoilFrank Wilson PharmD., BCPS Clinical Pharmacist Pager [phone] 05/27/2017 1:30 PM

## 2017-05-27 NOTE — Progress Notes (Signed)
Subjective: Patient is mostly non-vocal but wife at bedside provides information. She states he had improved mentally since last neuro had seen him. He was conversing last night and able to follow directions. This AM he is versus lethargic, has mouth tremor, not following commands and not vocal. He is able to show me his thumbs but that is about all I can get him to do. This is a stark difference than last night per wife. She states they are slightly worried about Amiodarone toxicity.   His UA does show many bacteria and moderate Leukocytes UC and BC pending. Ammonia WNL. INR 1.93 WBC has increased from 12.5-15.2 over last three days Afebrile  Objective: Current vital signs: BP 101/75   Pulse (!) 51   Temp 98.2 F (36.8 C) (Oral)   Resp (!) 33   Ht 6' (1.829 m)   Wt 101.6 kg (224 lb)   SpO2 97%   BMI 30.38 kg/m  Vital signs in last 24 hours: Temp:  [97.6 F (36.4 C)-99.3 F (37.4 C)] 98.2 F (36.8 C) (08/07 0630) Pulse Rate:  [51-67] 51 (08/07 0825) Resp:  [22-33] 33 (08/07 0825) BP: (84-101)/(0-75) 101/75 (08/07 0619) SpO2:  [97 %-98 %] 97 % (08/07 0825) Weight:  [101.6 kg (224 lb)] 101.6 kg (224 lb) (08/07 0536)  Intake/Output from previous day: 08/06 0701 - 08/07 0700 In: 820 [P.O.:820] Out: 1150 [Urine:1150] Intake/Output this shift: Total I/O In: -  Out: 175 [Urine:175] Nutritional status: DIET SOFT Room service appropriate? Yes; Fluid consistency: Thin  ROS:                                                                                                                                       History obtained from unobtainable from patient due to mental status      Neurologic Exam: General: NAD Mental Status: Alert, staring forward, mouth tremor, able to show his thumbs but follows no other commands.  Cranial Nerves: II:  Blinks to threat bilaterally, pupils equal, round, reactive to light and accommodation III,IV, VI: ptosis not present, extra-ocular motions  intact bilaterally V,VII: face symmetric, facial light touch sensation normal bilaterally VIII: hearing normal bilaterally   Motor: Able to hold arms off bed for short period of time. Not moving legs --has significant edema in LE-- Sensory: winces to pain bilateral UE but no response in LE Deep Tendon Reflexes:  1+ bilateral UE with no LE  Plantars: Mute bilateally   Lab Results: Basic Metabolic Panel:  Recent Labs Lab 05/23/17 0420 05/24/17 0426 05/25/17 0347 05/26/17 0413 05/27/17 0419  NA 138 137 135 137 137  K 4.1 3.5 3.6 4.2 4.2  CL 109 110 106 108 106  CO2 23 24 23 22 22   GLUCOSE 157* 159* 135* 150* 145*  BUN 47* 46* 44* 45* 47*  CREATININE 1.78* 1.75* 1.73* 1.98* 2.04*  CALCIUM 7.9* 7.6* 7.8* 7.7* 8.0*  MG 1.7  1.4* 1.7 2.0 1.9    Liver Function Tests: No results for input(s): AST, ALT, ALKPHOS, BILITOT, PROT, ALBUMIN in the last 168 hours. No results for input(s): LIPASE, AMYLASE in the last 168 hours.  Recent Labs Lab 05/27/17 0800  AMMONIA 20    CBC:  Recent Labs Lab 05/23/17 0420 05/24/17 0426 05/25/17 0347 05/26/17 0413 05/27/17 0419  WBC 9.7 12.5* 12.8* 17.8* 15.2*  NEUTROABS  --   --   --  15.8*  --   HGB 8.8* 8.0* 8.1* 7.5* 9.5*  HCT 27.1* 24.7* 25.2* 23.4* 29.2*  MCV 89.7 89.2 89.7 90.3 90.4  PLT 398 288 298 293 289    Cardiac Enzymes: No results for input(s): CKTOTAL, CKMB, CKMBINDEX, TROPONINI in the last 168 hours.  Lipid Panel: No results for input(s): CHOL, TRIG, HDL, CHOLHDL, VLDL, LDLCALC in the last 168 hours.  CBG:  Recent Labs Lab 05/26/17 0639 05/26/17 1205 05/26/17 1616 05/26/17 2103 05/27/17 0559  GLUCAP 164* 112* 125* 192* 139*    Microbiology: Results for orders placed or performed during the hospital encounter of 03/22/2017  Surgical pcr screen     Status: Abnormal   Collection Time: 04/02/2017  3:01 PM  Result Value Ref Range Status   MRSA, PCR POSITIVE (A) NEGATIVE Final    Comment: RESULT CALLED TO,  READ BACK BY AND VERIFIED WITH: V MENDEZ RN 2003 04/08/2017 A BROWNING    Staphylococcus aureus POSITIVE (A) NEGATIVE Final    Comment:        The Xpert SA Assay (FDA approved for NASAL specimens in patients over 61 years of age), is one component of a comprehensive surveillance program.  Test performance has been validated by [facility] for patients greater than or equal to 27 year old. It is not intended to diagnose infection nor to guide or monitor treatment.   Culture, blood (x 2)     Status: None   Collection Time: 04/24/17  2:23 AM  Result Value Ref Range Status   Specimen Description BLOOD A-LINE DRAW  Final   Special Requests   Final    BOTTLES DRAWN AEROBIC AND ANAEROBIC Blood Culture adequate volume   Culture NO GROWTH 5 DAYS  Final   Report Status 04/29/2017 FINAL  Final  Culture, blood (x 2)     Status: None   Collection Time: 04/24/17  8:20 AM  Result Value Ref Range Status   Specimen Description BLOOD LEFT ANTECUBITAL  Final   Special Requests IN PEDIATRIC BOTTLE Blood Culture adequate volume  Final   Culture NO GROWTH 5 DAYS  Final   Report Status 04/29/2017 FINAL  Final  Culture, respiratory (NON-Expectorated)     Status: None   Collection Time: 04/24/17 11:33 AM  Result Value Ref Range Status   Specimen Description TRACHEAL ASPIRATE  Final   Special Requests NONE  Final   Gram Stain   Final    FEW WBC PRESENT,BOTH PMN AND MONONUCLEAR RARE GRAM NEGATIVE RODS    Culture   Final    FEW KLEBSIELLA PNEUMONIAE FEW CITROBACTER KOSERI    Report Status 04/27/2017 FINAL  Final   Organism ID, Bacteria KLEBSIELLA PNEUMONIAE  Final   Organism ID, Bacteria CITROBACTER KOSERI  Final      Susceptibility   Citrobacter koseri - MIC*    CEFAZOLIN <=4 SENSITIVE Sensitive     CEFEPIME <=1 SENSITIVE Sensitive     CEFTAZIDIME <=1 SENSITIVE Sensitive     CEFTRIAXONE <=1 SENSITIVE Sensitive  CIPROFLOXACIN <=0.25 SENSITIVE Sensitive     GENTAMICIN <=1 SENSITIVE  Sensitive     IMIPENEM <=0.25 SENSITIVE Sensitive     TRIMETH/SULFA <=20 SENSITIVE Sensitive     PIP/TAZO 16 SENSITIVE Sensitive     * FEW CITROBACTER KOSERI   Klebsiella pneumoniae - MIC*    AMPICILLIN >=32 RESISTANT Resistant     CEFAZOLIN <=4 SENSITIVE Sensitive     CEFEPIME <=1 SENSITIVE Sensitive     CEFTAZIDIME <=1 SENSITIVE Sensitive     CEFTRIAXONE <=1 SENSITIVE Sensitive     CIPROFLOXACIN <=0.25 SENSITIVE Sensitive     GENTAMICIN <=1 SENSITIVE Sensitive     IMIPENEM <=0.25 SENSITIVE Sensitive     TRIMETH/SULFA <=20 SENSITIVE Sensitive     AMPICILLIN/SULBACTAM 4 SENSITIVE Sensitive     PIP/TAZO <=4 SENSITIVE Sensitive     Extended ESBL NEGATIVE Sensitive     * FEW KLEBSIELLA PNEUMONIAE  Culture, respiratory (NON-Expectorated)     Status: None   Collection Time: 04/26/17  8:15 AM  Result Value Ref Range Status   Specimen Description TRACHEAL ASPIRATE  Final   Special Requests NONE  Final   Gram Stain   Final    ABUNDANT WBC PRESENT, PREDOMINANTLY PMN FEW SQUAMOUS EPITHELIAL CELLS PRESENT RARE GRAM NEGATIVE RODS RARE GRAM POSITIVE COCCI IN PAIRS    Culture MODERATE KLEBSIELLA PNEUMONIAE  Final   Report Status 04/28/2017 FINAL  Final   Organism ID, Bacteria KLEBSIELLA PNEUMONIAE  Final      Susceptibility   Klebsiella pneumoniae - MIC*    AMPICILLIN >=32 RESISTANT Resistant     CEFAZOLIN <=4 SENSITIVE Sensitive     CEFEPIME <=1 SENSITIVE Sensitive     CEFTAZIDIME <=1 SENSITIVE Sensitive     CEFTRIAXONE <=1 SENSITIVE Sensitive     CIPROFLOXACIN <=0.25 SENSITIVE Sensitive     GENTAMICIN <=1 SENSITIVE Sensitive     IMIPENEM <=0.25 SENSITIVE Sensitive     TRIMETH/SULFA <=20 SENSITIVE Sensitive     AMPICILLIN/SULBACTAM 4 SENSITIVE Sensitive     PIP/TAZO <=4 SENSITIVE Sensitive     Extended ESBL NEGATIVE Sensitive     * MODERATE KLEBSIELLA PNEUMONIAE  MRSA PCR Screening     Status: None   Collection Time: 05/15/17 12:21 PM  Result Value Ref Range Status   MRSA by PCR  NEGATIVE NEGATIVE Final    Comment:        The GeneXpert MRSA Assay (FDA approved for NASAL specimens only), is one component of a comprehensive MRSA colonization surveillance program. It is not intended to diagnose MRSA infection nor to guide or monitor treatment for MRSA infections.     Coagulation Studies:  Recent Labs  05/25/17 0347 05/26/17 0413 05/27/17 0419  LABPROT 22.1* 23.4* 22.4*  INR 1.90 2.05 1.93    Imaging: Dg Chest Port 1 View  Result Date: 05/27/2017 CLINICAL DATA:  Dyspnea. EXAM: PORTABLE CHEST 1 VIEW COMPARISON:  Radiographs May 18, 2017. FINDINGS: Stable cardiomediastinal silhouette. Surgical staples are noted. Stable position of left ventricular assistance device. Single lead left-sided pacemaker is unchanged in position. Elevated left hemidiaphragm is again noted. Right-sided PICC line is unchanged in position. No pneumothorax or pleural effusion is noted. No acute pulmonary disease is noted. Bony thorax is unremarkable. IMPRESSION: Stable support apparatus as described above. No acute abnormality noted currently. Electronically Signed   By: Lupita Raider, M.D.   On: 05/27/2017 09:29    Medications:  Scheduled: . amiodarone  200 mg Oral BID  . amLODipine  10 mg Oral Daily  .  febuxostat  120 mg Oral Daily  . feeding supplement (ENSURE ENLIVE)  237 mL Oral TID BM  . hydrALAZINE  100 mg Oral Q8H  . insulin aspart  0-15 Units Subcutaneous TID WC  . isosorbide mononitrate  30 mg Oral Daily  . magnesium oxide  400 mg Oral Daily  . metoCLOPramide (REGLAN) injection  5 mg Intravenous Q6H  . mexiletine  300 mg Oral Q8H  . mirtazapine  15 mg Oral QHS  . pantoprazole  40 mg Oral BID  . potassium chloride  40 mEq Oral Daily  . sodium chloride flush  10-40 mL Intracatheter Q12H  . sotalol  80 mg Oral Daily  . spironolactone  25 mg Oral Daily  . Warfarin - Pharmacist Dosing Inpatient   Does not apply q1800   Felicie MornDavid Smith PA-C [facility] [phone]  05/27/2017, 10:11 AM  I have seen the patient, he does follow commands but is confused. He appears to have asterixis.  Assessment/Plan: 56 year old male with what is likely delirium or toxic/metabolic encephalopathy. The presence of asterixis suggestive of a toxic/metabolic etiology. Certainly, if a gram-negative organisms is causing a urinary tract infection, this is a common cause for encephalopathy and a hospitalized patient, though typically I think this more with elderly, he has been quite sick for quite some time and may be more predisposed to delirium than most people his age.  1) pharmacy consult to review meds for causes of asterixis 2) treatment of urinary tract infection 3) EEG 4) TSH, a.m. Cortisol 5) pharmacy consult for causes of asterixis 6) neurology will continue to follow  Ritta SlotMcNeill Latrisha Coiro, MD [facility] [phone]  If 7pm- 7am, please page neurology on call as listed in AMION.

## 2017-05-27 NOTE — Progress Notes (Addendum)
Nutrition Consult/Follow Up   DOCUMENTATION CODES:   Severe malnutrition in context of chronic illness  INTERVENTION:    Initiate Glucerna 1.2 formula at 25 ml/hr and increase by 10 ml every 4 hours to goal rate of 75 ml/hr  TF regimen to provide 2160 kcals, 108 gm protein, 1449 ml of free water  NUTRITION DIAGNOSIS:   Malnutrition (severe) related to chronic illness (CHF) as evidenced by severe depletion of body fat, severe depletion of muscle mass, ongoing  GOAL:   Patient will meet greater than or equal to 90% of their needs, progressing  MONITOR:   TF tolerance, Labs, Weight trends, Skin, I & O's  ASSESSMENT:   56 yo male with hx of HTN, gout, obesity, chronic edema, CAD, ischemic cardiomyopathy, DM2, AICD, MI, CHF, renal insufficiency who was admitted on 6/22 with acute on chronic systolic heart failure.  6/28  HVAD placement 6/29  Extubated 7/01  Soft diet started, Ensure Enlive added 7/04  Re-intubated 7/05  TPN started 7/06  Extubated 7/11  TPN D/C'd  Pt with decreased alertness and arousal.  RN more comfortable with pt being NPO. Large bore NGT placed at bedside.  RD consulted for TF initiation & management. Labs and medications reviewed. CBG's [id number].  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  Estimated Nutritional Needs:   Kcal:  2100-2300  Protein:  110-125 gm  Fluid:  2.1-2.3 L  EDUCATION NEEDS:   No education needs identified at this time  Maureen ChattersKatie Olson Lucarelli, RD, LDN Pager #: [phone] After-Hours Pager #: [phone]

## 2017-05-27 NOTE — Care Management Note (Signed)
Case Management Note Previous CM note initiated by [name], Brenda Kaye, RN--04/05/2017, 12:14 PM     Patient Details  Name: James [name] MRN: [id number] Date of Birth: 06-Dec-1960  Subjective/Objective: Pt presented for CHF exacerbation. Pt initiated on IV Lasix and Milrinone gtt. Plan to continue LVAD workup.  Pt is from home with wife.                   Action/Plan: CM will continue to monitor for additional needs.   Expected Discharge Date:                  Expected Discharge Plan:  IP Rehab Facility  In-House Referral:  NA  Discharge planning Services  CM Consult  Post Acute Care Choice:    Choice offered to:     DME Arranged:    DME Agency:     HH Arranged:    HH Agency:     Status of Service:  In process, will continue to follow  If discussed at Long Length of Stay Meetings, dates discussed:  7/31, 8/2, 8/7  Discharge Disposition:   Additional Comments:  05/27/17- 1000- James PieriniKristi Leyli Kevorkian RN, CM- pt has had multiple post op set backs/complications- including ileus, GIB, VT, and vol overload. CIR continues to follow for possible admission when medically stable- pt more lethargic today and not vocal- MD to consult Neurology. CM will continue to follow.   04/18/17- 1040- James Hazzard RN, CM- pt s/p [facility] implated on 04/13/2017 by Dr. Laneta SimmersBartle. tx to 2H post op- CM to continue to follow for d/c needs   Darrold SpanWebster, [name], RN 05/27/2017, 10:51 AM

## 2017-05-27 NOTE — Procedures (Signed)
HPI:  56 y/o with MS change  TECHNICAL SUMMARY:  A multichannel referential and bipolar montage EEG using the standard international 10-20 system was performed on the patient described as confused.  At maximum, the dominant background activity consists of 7-8 hertz activity seen most prominantly over the posterior head region.  4-6 Hz activity can be seen intermixed in all head regions.  Low voltage fast activity is distributed symmetrically and maximally in the anterior head regions.  ACTIVATION:  Stepwise photic stimulation and hyperventilation are not performed  EPILEPTIFORM ACTIVITY:  There were no spikes, sharp waves or paroxysmal activity.  The patient did have episodes of body jerking and tremors, which were unaccompanied by changes in the background activity (with the exception of myogenic artifact).  SLEEP:  No sleep was noted  IMPRESSION:  This is an abnormal EEG demonstrating a mild to moderate diffuse slowing of electrocerebral activity.  This can be seen in a wide variety of encephalopathic state including those of a toxic, metabolic, or degenerative nature.  There were no focal, hemispheric, or lateralizing features.  No epileptiform activity was recorded.  The patient did have episodes of body jerking and tremors, which were unaccompanied by changes in the background activity (with the exception of myogenic artifact).  Correlate clinically.

## 2017-05-27 NOTE — Progress Notes (Signed)
Chaplain found family of this patient in waiting room.  Chaplain was told by spouse of the patient's confusion and ongoing problems.  Spouse is teary and weary.  Chaplain will continue to follow this family and provide support as needed.  Please page Chaplain should a need arise.  Chaplain would like to thank the medical team for their support of this patient and family.    05/27/17 1507  Clinical Encounter Type  Visited With Family  Visit Type Follow-up;Psychological support;Spiritual support;Social support  Recommendations (Continued care and follow up)

## 2017-05-27 NOTE — Progress Notes (Signed)
Patient ID: James [name], male   DOB: [birth year], 56 y.o.   MRN: [id number]   Advanced Heart Failure VAD Team Note  Subjective:    Events: --HVAD placed 6/28.  Returned to the OR that evening with high chest tube output, evacuation of mediastinal hematoma.  --Extubated 6/29. Milrinone stopped on 7/4. --7/4, patient developed ileus with respiratory compromise. NGT placed with 3 L suctioned out.  CXR with suspicion for aspiration PNA.  Patient had to be intubated.  He went into atrial fibrillation with RVR.  He became hypotensive and was started on norepinephrine and phenylephrine.  Amiodarone gtt begun.    --Extubated again on 7/6.  -- 7/13 Had 2 sustained episodes of VT. Had to be cardioverted 1. Second episode broke with overdrive pacing with Dr. Graciela Husbands. VAD speed turned down to 2700.  --Melena due to acuteGI bleed --> 2 units PRBCs 7/14, 2 units 7/15. 3 U PRBCs 7/16,  05/19/2017 3 UPRBCs.  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  He had epinephrine, APC, and 2 clips. Intubated prior to procedure and placed on norepinephrine. Speed dropped to 2660.  --7/18 Back in VT with overdrive pacing. Lidocaine was started in addition to amiodarone. Extubated. --7/19 lidocaine stopped with confusion. Neuro consulted.  EEG normal.  CT of head no acute findings. 7/20 confusion had resolved.  --1 unit PRBCs 7/20.  --7/22, developed sustained VT with rate around 130 shortly after getting IV Lasix.  We were able to pace him out and back to NSR.  He was put back on amiodarone gtt.  --More VT on 7/25, paced out.  Back on IV amiodarone gtt.  NSR.  -- 7/31 Ramp ECHO decrease speed 2600 rpm.  --8/1 VT- paced out --8/2 VT - started on sotalol --8/6 Hgb 7.5, 1 unit PRBCs - 8/7 Hgb 9.5, confused.  Head CT with right superior cerebellar artery lacunar infarcts, not acute.   Confused/delirious.  Oriented to person. Denies SOB. Complaining of fatigue.   HVAD  INTERROGATION:  HVAD:  Flow 5 liters/min, speed 2600,  power 3.9 W,  Peak 7.4 Trough 3.1  Suction On. Lavare On. No alarms.    Objective:    Vital Signs:   Temp:  [97.6 F (36.4 C)-99.3 F (37.4 C)] 98.8 F (37.1 C) (08/07 0536) Pulse Rate:  [65-67] 66 (08/07 0536) Resp:  [22-24] 23 (08/07 0536) BP: (84-101)/(0-75) 101/75 (08/07 0619) SpO2:  [97 %-98 %] 97 % (08/07 0536) Weight:  [224 lb (101.6 kg)] 224 lb (101.6 kg) (08/07 0536) Last BM Date: 05/26/17 Mean arterial Pressure 80-100s   Intake/Output:   Intake/Output Summary (Last 24 hours) at 05/27/17 0732 Last data filed at 05/27/17 0630  Gross per 24 hour  Intake              580 ml  Output             1150 ml  Net             -570 ml     Physical Exam  CVP 5-6 General:  NAD. In bed.  HEENT: normal Neck: supple. no JVD. Carotids 2+ bilat; no bruits. No lymphadenopathy or thryomegaly appreciated. Cor: PMI nondisplaced. Regular rate & rhythm. No rubs, gallops or murmurs. Lungs: clear Abdomen: soft, nontender, nondistended. No hepatosplenomegaly. No bruits or masses. Good bowel sounds. Extremities: no cyanosis, clubbing, rash, R and LLE 2+ edema. RUE PICC Neuro: alert & orientedx3 , cranial nerves grossly intact. moves all  4 extremities w/o difficulty. Affect pleasant     Telemetry   NSR 60s personally reviewed.   Labs   Basic Metabolic Panel:  Recent Labs Lab 05/23/17 0420 05/24/17 0426 05/25/17 0347 05/26/17 0413 05/27/17 0419  NA 138 137 135 137 137  K 4.1 3.5 3.6 4.2 4.2  CL 109 110 106 108 106  CO2 23 24 23 22 22   GLUCOSE 157* 159* 135* 150* 145*  BUN 47* 46* 44* 45* 47*  CREATININE 1.78* 1.75* 1.73* 1.98* 2.04*  CALCIUM 7.9* 7.6* 7.8* 7.7* 8.0*  MG 1.7 1.4* 1.7 2.0 1.9    Liver Function Tests: No results for input(s): AST, ALT, ALKPHOS, BILITOT, PROT, ALBUMIN in the last 168 hours. No results for input(s): LIPASE, AMYLASE in the last 168 hours. No results for input(s): AMMONIA in the last 168  hours.  CBC:  Recent Labs Lab 05/23/17 0420 05/24/17 0426 05/25/17 0347 05/26/17 0413 05/27/17 0419  WBC 9.7 12.5* 12.8* 17.8* 15.2*  NEUTROABS  --   --   --  15.8*  --   HGB 8.8* 8.0* 8.1* 7.5* 9.5*  HCT 27.1* 24.7* 25.2* 23.4* 29.2*  MCV 89.7 89.2 89.7 90.3 90.4  PLT 398 288 298 293 289    INR:  Recent Labs Lab 05/23/17 0420 05/24/17 0426 05/25/17 0347 05/26/17 0413 05/27/17 0419  INR 2.48 2.40 1.90 2.05 1.93    Other results:     Imaging   No results found.   Medications:     Scheduled Medications: . amiodarone  200 mg Oral BID  . amLODipine  10 mg Oral Daily  . febuxostat  120 mg Oral Daily  . feeding supplement (ENSURE ENLIVE)  237 mL Oral TID BM  . hydrALAZINE  100 mg Oral Q8H  . insulin aspart  0-15 Units Subcutaneous TID WC  . isosorbide mononitrate  30 mg Oral Daily  . magnesium oxide  400 mg Oral Daily  . metoCLOPramide (REGLAN) injection  5 mg Intravenous Q6H  . mexiletine  300 mg Oral Q8H  . mirtazapine  15 mg Oral QHS  . pantoprazole  40 mg Oral BID  . potassium chloride  40 mEq Oral Daily  . sodium chloride flush  10-40 mL Intracatheter Q12H  . sotalol  80 mg Oral Daily  . spironolactone  25 mg Oral Daily  . Warfarin - Pharmacist Dosing Inpatient   Does not apply q1800    Infusions: . sodium chloride Stopped (05/02/17 0630)  . sodium chloride 10 mL/hr at 05/16/17 1800  . sodium chloride      PRN Medications: albuterol, docusate, hydrALAZINE, neomycin-bacitracin-polymyxin, ondansetron (ZOFRAN) IV, oxyCODONE, phenol, sodium chloride flush, traMADol   Patient Profile   56 yo with CAD s/p CABG, ischemic cardiomyopathy/chronic systolic CHF, tophaceous gout, and CKD stage 3 was admitted for diuresis and consideration for LVAD placement. S/p HVAD on 6/28  Assessment/Plan:    1. Acute/chronic systolic CHF s/p HVAD: Ischemic cardiomyopathy.  [employer] Jude ICD.  Echo (6/18) with EF 15%, mildly dilated RV with moderately decreased systolic  function.  s/p HVAD placement 6/28 and had to return to OR to evacuate mediastinal hematoma.  He had been weaned off pressors/milrinone, but developed ileus w/ likely aspiration event 7/4, re-intubated, developed afib/RVR requiring norepinephrine. Extubated 7/6.  VAD speed turned down to 2660 with VT. VAD parameters currently look good.  Milrinone stopped 7/17 early am with recurrent VT.  Now off norepinephrine.   Maps ok.  - Todays CO-OX is elevated. Repeat now.  Complicated with RV failure and VT.  - CVP low hold torsemide.  - Continue spironolactone 25 daily.  - MAP overall better.  Continue amlodipine 10 mg daily. Continue hydralazine to 100 mg three times a day, imdur 60 mg daily.  - Off ASA with GI Bleed  - INR 1.93 , Goal 2-2.5.  - Has been off dig/arb with elevated creatinine.     2. AKI on CKD stage 3: Suspect a component of peri-op ATN, worsened with ileus, vomiting, intubation/sedation. Creatinine up to 2.04.  Watch closely  3. Symptomatic anemia due to acute UGI bleeding:  Received 3 units PRBCs 7/16 and 3 units PRBCs on 04/24/2017. 1 unit PRBCs 7/20. 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  Requiring 2 clips + epi + APC. Does not appear to be bleeding actively now.  - Got Feraheme 7/28.  - Continue Protonix 40 po bid. - Back on warfarin, aiming for INR 2-2.5 range.  Will leave off aspirin for now. Dosing per pharm. - Transfuse hgb < 8 =>  -On 8/6 received 1UPRBCs. Today hgb up to 9.5.  4. Ventricular tachycardia: Status post ventricular tachycardia 2 on 7/13. Possible suction event. VAD speed turned down to 2700.  Recurrent VT 7/17. EP called to bedside. Overdrive pacing successful for about 10 minutes but VT recurred.  Milrinone turned off, remained in VT overnight but hemodynamically stable.  Speed decreased to 2660.  On 7/18, we were able to pace him out of VT.  Lidocaine added then stopped due to confusion/mental status changes.  He had VT 7/22  about 30 minutes after getting IV Lasix, had to be paced out again => may have been due to rapid fluid shift.  Pt paced out of VT 3 separate occasions 7/25. Got amio bolus x 2 and started back on IV infusion.  Transitioned to amiodarone 400 mg tid.  On 8/1 had recurrent VT and paced out again.  Recurrent VT 8/2 per EP sotalol started along with amiodarone, sotalol decreased to once daily with bradycardia.   -No VT.  - Continue mexiletine up to 300 mg every 8 hrs .    - Continue sotalol 80 mg daily. - Cut back amio 200 mg twice a day.    5. CAD: s/p CABG 2012. No S/S ischemia.  Off aspirin due to GI bleeding. No change.   6. Gout: Severe tophaceous gout. Left shoulder pain ?gout.   - Completed 3 days of prednisone.   - Continue uloric.  7. Atrial fibrillation: Developed atrial fibrillation with RVR in setting of aspiration PNA on 7/4.    Hard to know with interference from HMIII. Appears NSR.  8. Malnutrition: Now off TPN. Per Speech- FEEs performed. Prealbumin very low. Nutrition recommendations appreciated.  Continue supplements. Appetite improving.  9. Aspiration PNA with acute hypoxemic respiratory failure on 7/4: He was extubated on 7/6. Tracheal aspirate with Klebsiella and citrobacter. Both sensitive to Zosyn. Completed abx 7/13. WBC coming down.  10. Ileus: Resolved.  11. Deconditioning:  Plan for CIR when stable. OT/PT appreciated. CIR consulted    12. Acute Respiratory Failure: Intubated 7/17 in setting of complex GI intervention. Extubated 7/18. Resolved on room air.   13. Delirium: Resolved. No further. Appreciate neuro input Possibly related to lidocaine.  Lidocaine stopped. CT of head negative. EEG ok. No further workup per neuro.  Confused today. Check UA/ CXR. Check Amonia 15. Insomnia: On remeron for sleep and depression. 16. Post-op depression: Remeron started 7/28.  I reviewed the HVAD parameters from today, and compared the results to the patient's prior recorded data.  No  programming changes were made.  The HVAD is functioning within specified parameters.     James BecketAmy Clegg, NP 05/27/2017, 7:32 AM  VAD Team --- VAD ISSUES ONLY--- Pager [phone] (7am - 7am) Advanced Heart Failure Team  Pager [phone] (M-F; 7a - 4p)  Please contact [facility] for night-coverage after hours (4p -7a ) and weekends on [email or web address]  Patient seen with NP, agree with the above note.   He is confused/delirious today.  NH3 not elevated.  Hemoglobin/creatinine stable.  CT head with lacunar infarcts but do not appear acute.  - Neurology re-evaluating.  - ?Related to UTI => UA suggestive, will send urine/blood cultures and start abx.   CVP 5-6, will hold torsemide today and likely decrease to 20 mg daily tomorrow.   No further VT.  Amiodarone decreased.  Do not think acute mental status change is due to amiodarone as he has been on amiodarone for a long time now.   Hemoglobin stable this morning, no evidence for overt bleeding.   James [name] 05/27/2017 1:27 PM

## 2017-05-27 NOTE — Progress Notes (Signed)
EEG completed, results pending. 

## 2017-05-28 ENCOUNTER — Inpatient Hospital Stay (HOSPITAL_COMMUNITY): Payer: Medicare PPO

## 2017-05-28 ENCOUNTER — Telehealth (HOSPITAL_COMMUNITY): Payer: Self-pay | Admitting: *Deleted

## 2017-05-28 LAB — GLUCOSE, CAPILLARY
GLUCOSE-CAPILLARY: 84 mg/dL (ref 65–99)
GLUCOSE-CAPILLARY: 102 mg/dL — AB (ref 65–99)
Glucose-Capillary: 108 mg/dL — ABNORMAL HIGH (ref 65–99)
Glucose-Capillary: 116 mg/dL — ABNORMAL HIGH (ref 65–99)
Glucose-Capillary: 150 mg/dL — ABNORMAL HIGH (ref 65–99)
Glucose-Capillary: 78 mg/dL (ref 65–99)
Glucose-Capillary: 89 mg/dL (ref 65–99)
Glucose-Capillary: 89 mg/dL (ref 65–99)

## 2017-05-28 LAB — BASIC METABOLIC PANEL
Anion gap: 9 (ref 5–15)
BUN: 47 mg/dL — ABNORMAL HIGH (ref 6–20)
CALCIUM: 7.9 mg/dL — AB (ref 8.9–10.3)
CO2: 20 mmol/L — ABNORMAL LOW (ref 22–32)
CREATININE: 2.16 mg/dL — AB (ref 0.61–1.24)
Chloride: 108 mmol/L (ref 101–111)
GFR calc non Af Amer: 33 mL/min — ABNORMAL LOW (ref >60)
GFR, EST AFRICAN AMERICAN: 38 mL/min — AB (ref >60)
Glucose, Bld: 111 mg/dL — ABNORMAL HIGH (ref 65–99)
Potassium: 4.4 mmol/L (ref 3.5–5.1)
SODIUM: 137 mmol/L (ref 135–145)

## 2017-05-28 LAB — POCT I-STAT 3, ART BLOOD GAS (G3+)
Acid-base deficit: 4 mmol/L — ABNORMAL HIGH (ref 0.0–2.0)
Bicarbonate: 20.8 mmol/L (ref 20.0–28.0)
O2 SAT: 100 %
PCO2 ART: 37.6 mmHg (ref 32.0–48.0)
PH ART: 7.352 (ref 7.350–7.450)
TCO2: 22 mmol/L (ref 0–100)
pO2, Arterial: 360 mmHg — ABNORMAL HIGH (ref 83.0–108.0)

## 2017-05-28 LAB — PROTIME-INR
INR: 2.09
PROTHROMBIN TIME: 23.8 s — AB (ref 11.4–15.2)

## 2017-05-28 LAB — URINE CULTURE

## 2017-05-28 LAB — COOXEMETRY PANEL
CARBOXYHEMOGLOBIN: 1.9 % — AB (ref 0.5–1.5)
METHEMOGLOBIN: 0.9 % (ref 0.0–1.5)
O2 SAT: 60 %
TOTAL HEMOGLOBIN: 9.9 g/dL — AB (ref 12.0–16.0)

## 2017-05-28 LAB — PROCALCITONIN: PROCALCITONIN: 0.99 ng/mL

## 2017-05-28 LAB — CBC
HCT: 30.2 % — ABNORMAL LOW (ref 39.0–52.0)
Hemoglobin: 9.6 g/dL — ABNORMAL LOW (ref 13.0–17.0)
MCH: 28.2 pg (ref 26.0–34.0)
MCHC: 31.8 g/dL (ref 30.0–36.0)
MCV: 88.8 fL (ref 78.0–100.0)
Platelets: 300 10*3/uL (ref 150–400)
RBC: 3.4 MIL/uL — ABNORMAL LOW (ref 4.22–5.81)
RDW: 18.3 % — AB (ref 11.5–15.5)
WBC: 16.5 10*3/uL — ABNORMAL HIGH (ref 4.0–10.5)

## 2017-05-28 LAB — CORTISOL: CORTISOL PLASMA: 15.9 ug/dL

## 2017-05-28 LAB — MAGNESIUM: MAGNESIUM: 2.1 mg/dL (ref 1.7–2.4)

## 2017-05-28 LAB — LACTATE DEHYDROGENASE: LDH: 189 U/L (ref 98–192)

## 2017-05-28 MED ORDER — POTASSIUM CHLORIDE 20 MEQ/15ML (10%) PO SOLN
40.0000 meq | Freq: Every day | ORAL | Status: DC
  Administered 2017-05-28: 40 meq
  Filled 2017-05-28: qty 30

## 2017-05-28 MED ORDER — HYDRALAZINE HCL 20 MG/ML IJ SOLN
10.0000 mg | INTRAMUSCULAR | Status: DC
  Administered 2017-05-28 – 2017-05-29 (×2): 10 mg via INTRAVENOUS
  Filled 2017-05-28 (×2): qty 1

## 2017-05-28 MED ORDER — CHLORHEXIDINE GLUCONATE 0.12% ORAL RINSE (MEDLINE KIT)
15.0000 mL | Freq: Two times a day (BID) | OROMUCOSAL | Status: DC
  Administered 2017-05-28 – 2017-06-25 (×54): 15 mL via OROMUCOSAL

## 2017-05-28 MED ORDER — CHLORHEXIDINE GLUCONATE CLOTH 2 % EX PADS
6.0000 | MEDICATED_PAD | Freq: Every day | CUTANEOUS | Status: DC
  Administered 2017-05-29 – 2017-06-11 (×13): 6 via TOPICAL

## 2017-05-28 MED ORDER — MIDAZOLAM HCL 2 MG/2ML IJ SOLN
2.0000 mg | Freq: Once | INTRAMUSCULAR | Status: AC
  Administered 2017-05-28: 2 mg via INTRAVENOUS

## 2017-05-28 MED ORDER — SODIUM CHLORIDE 0.9 % IV SOLN
1.0000 g | Freq: Two times a day (BID) | INTRAVENOUS | Status: DC
  Administered 2017-05-28 – 2017-06-07 (×21): 1 g via INTRAVENOUS
  Filled 2017-05-28 (×23): qty 1

## 2017-05-28 MED ORDER — FUROSEMIDE 10 MG/ML IJ SOLN: 40.0000 mg | INTRAMUSCULAR | Status: AC

## 2017-05-28 MED ORDER — GLUCERNA 1.2 CAL PO LIQD
1000.0000 mL | ORAL | Status: DC
  Administered 2017-05-29 – 2017-06-05 (×9): 1000 mL
  Filled 2017-05-28 (×22): qty 1000

## 2017-05-28 MED ORDER — MIDAZOLAM HCL 2 MG/2ML IJ SOLN
INTRAMUSCULAR | Status: AC
  Filled 2017-05-28: qty 2

## 2017-05-28 MED ORDER — PANTOPRAZOLE SODIUM 40 MG PO PACK
40.0000 mg | PACK | Freq: Two times a day (BID) | ORAL | Status: DC
  Administered 2017-05-29 – 2017-06-25 (×55): 40 mg
  Filled 2017-05-28 (×55): qty 20

## 2017-05-28 MED ORDER — FENTANYL CITRATE (PF) 100 MCG/2ML IJ SOLN
INTRAMUSCULAR | Status: AC
  Filled 2017-05-28: qty 2

## 2017-05-28 MED ORDER — ORAL CARE MOUTH RINSE
15.0000 mL | OROMUCOSAL | Status: DC
  Administered 2017-05-28 – 2017-06-11 (×126): 15 mL via OROMUCOSAL

## 2017-05-28 MED ORDER — ROCURONIUM BROMIDE 50 MG/5ML IV SOLN
50.0000 mg | Freq: Once | INTRAVENOUS | Status: AC
  Administered 2017-05-28: 50 mg via INTRAVENOUS

## 2017-05-28 MED ORDER — ACETAMINOPHEN 160 MG/5ML PO SOLN
650.0000 mg | Freq: Four times a day (QID) | ORAL | Status: DC | PRN
  Administered 2017-05-28 – 2017-06-23 (×6): 650 mg
  Filled 2017-05-28 (×6): qty 20.3

## 2017-05-28 MED ORDER — FUROSEMIDE 10 MG/ML IJ SOLN
INTRAMUSCULAR | Status: AC
  Administered 2017-05-28: 40 mg
  Filled 2017-05-28: qty 4

## 2017-05-28 MED ORDER — VANCOMYCIN HCL 10 G IV SOLR
1250.0000 mg | INTRAVENOUS | Status: DC
  Administered 2017-05-29 – 2017-06-01 (×4): 1250 mg via INTRAVENOUS
  Filled 2017-05-28 (×4): qty 1250

## 2017-05-28 MED ORDER — FENTANYL CITRATE (PF) 100 MCG/2ML IJ SOLN
50.0000 ug | Freq: Once | INTRAMUSCULAR | Status: AC
  Administered 2017-05-28: 50 ug via INTRAVENOUS

## 2017-05-28 MED ORDER — ETOMIDATE 2 MG/ML IV SOLN
20.0000 mg | Freq: Once | INTRAVENOUS | Status: AC
  Administered 2017-05-28: 20 mg via INTRAVENOUS

## 2017-05-28 MED ORDER — DEXTROSE 50 % IV SOLN
1.0000 | Freq: Once | INTRAVENOUS | Status: DC
  Filled 2017-05-28: qty 50

## 2017-05-28 MED ORDER — INSULIN ASPART 100 UNIT/ML ~~LOC~~ SOLN
0.0000 [IU] | SUBCUTANEOUS | Status: DC
  Administered 2017-05-30 (×3): 2 [IU] via SUBCUTANEOUS
  Administered 2017-05-30: 3 [IU] via SUBCUTANEOUS
  Administered 2017-05-30: 2 [IU] via SUBCUTANEOUS
  Administered 2017-05-31 (×4): 3 [IU] via SUBCUTANEOUS
  Administered 2017-05-31: 5 [IU] via SUBCUTANEOUS
  Administered 2017-05-31 – 2017-06-01 (×2): 3 [IU] via SUBCUTANEOUS
  Administered 2017-06-01: 5 [IU] via SUBCUTANEOUS
  Administered 2017-06-01: 2 [IU] via SUBCUTANEOUS
  Administered 2017-06-01 – 2017-06-02 (×4): 3 [IU] via SUBCUTANEOUS
  Administered 2017-06-02: 2 [IU] via SUBCUTANEOUS
  Administered 2017-06-02 – 2017-06-03 (×4): 3 [IU] via SUBCUTANEOUS
  Administered 2017-06-03: 5 [IU] via SUBCUTANEOUS
  Administered 2017-06-03 – 2017-06-04 (×2): 3 [IU] via SUBCUTANEOUS
  Administered 2017-06-04 (×4): 5 [IU] via SUBCUTANEOUS
  Administered 2017-06-04 – 2017-06-05 (×3): 3 [IU] via SUBCUTANEOUS
  Administered 2017-06-05 (×2): 5 [IU] via SUBCUTANEOUS
  Administered 2017-06-05: 2 [IU] via SUBCUTANEOUS
  Administered 2017-06-05: 5 [IU] via SUBCUTANEOUS
  Administered 2017-06-06 (×4): 3 [IU] via SUBCUTANEOUS
  Administered 2017-06-06 – 2017-06-07 (×2): 2 [IU] via SUBCUTANEOUS
  Administered 2017-06-07: 5 [IU] via SUBCUTANEOUS
  Administered 2017-06-07: 3 [IU] via SUBCUTANEOUS
  Administered 2017-06-07: 8 [IU] via SUBCUTANEOUS
  Administered 2017-06-07: 3 [IU] via SUBCUTANEOUS
  Administered 2017-06-07: 5 [IU] via SUBCUTANEOUS
  Administered 2017-06-08: 2 [IU] via SUBCUTANEOUS
  Administered 2017-06-08: 3 [IU] via SUBCUTANEOUS
  Administered 2017-06-08 – 2017-06-09 (×4): 2 [IU] via SUBCUTANEOUS
  Administered 2017-06-09 (×3): 3 [IU] via SUBCUTANEOUS
  Administered 2017-06-10: 2 [IU] via SUBCUTANEOUS
  Administered 2017-06-10 (×2): 3 [IU] via SUBCUTANEOUS
  Administered 2017-06-10 – 2017-06-12 (×5): 2 [IU] via SUBCUTANEOUS
  Administered 2017-06-12 (×2): 3 [IU] via SUBCUTANEOUS
  Administered 2017-06-12: 2 [IU] via SUBCUTANEOUS
  Administered 2017-06-12 – 2017-06-13 (×2): 3 [IU] via SUBCUTANEOUS
  Administered 2017-06-13 (×2): 2 [IU] via SUBCUTANEOUS
  Administered 2017-06-13: 3 [IU] via SUBCUTANEOUS
  Administered 2017-06-13: 2 [IU] via SUBCUTANEOUS
  Administered 2017-06-13: 3 [IU] via SUBCUTANEOUS
  Administered 2017-06-14: 2 [IU] via SUBCUTANEOUS
  Administered 2017-06-14: 5 [IU] via SUBCUTANEOUS
  Administered 2017-06-14: 2 [IU] via SUBCUTANEOUS
  Administered 2017-06-14: 3 [IU] via SUBCUTANEOUS
  Administered 2017-06-14: 2 [IU] via SUBCUTANEOUS
  Administered 2017-06-14 – 2017-06-15 (×5): 3 [IU] via SUBCUTANEOUS
  Administered 2017-06-16: 2 [IU] via SUBCUTANEOUS
  Administered 2017-06-16: 3 [IU] via SUBCUTANEOUS
  Administered 2017-06-16 (×3): 2 [IU] via SUBCUTANEOUS
  Administered 2017-06-16 – 2017-06-17 (×3): 3 [IU] via SUBCUTANEOUS
  Administered 2017-06-17: 2 [IU] via SUBCUTANEOUS
  Administered 2017-06-17: 3 [IU] via SUBCUTANEOUS
  Administered 2017-06-17: 2 [IU] via SUBCUTANEOUS
  Administered 2017-06-18: 3 [IU] via SUBCUTANEOUS
  Administered 2017-06-18 (×2): 2 [IU] via SUBCUTANEOUS
  Administered 2017-06-18: 3 [IU] via SUBCUTANEOUS
  Administered 2017-06-18 – 2017-06-19 (×4): 2 [IU] via SUBCUTANEOUS
  Administered 2017-06-19: 3 [IU] via SUBCUTANEOUS
  Administered 2017-06-19 – 2017-06-20 (×6): 2 [IU] via SUBCUTANEOUS
  Administered 2017-06-20: 3 [IU] via SUBCUTANEOUS
  Administered 2017-06-20 – 2017-06-21 (×4): 2 [IU] via SUBCUTANEOUS
  Administered 2017-06-21: 3 [IU] via SUBCUTANEOUS
  Administered 2017-06-22 (×6): 2 [IU] via SUBCUTANEOUS
  Administered 2017-06-22: 3 [IU] via SUBCUTANEOUS
  Administered 2017-06-23: 2 [IU] via SUBCUTANEOUS
  Administered 2017-06-23: 3 [IU] via SUBCUTANEOUS
  Administered 2017-06-23 – 2017-06-24 (×2): 2 [IU] via SUBCUTANEOUS
  Administered 2017-06-24: 3 [IU] via SUBCUTANEOUS
  Administered 2017-06-24 – 2017-06-25 (×4): 2 [IU] via SUBCUTANEOUS

## 2017-05-28 MED ORDER — TORSEMIDE 20 MG PO TABS: 40.0000 mg | ORAL_TABLET | Freq: Every day | ORAL | Status: DC

## 2017-05-28 MED ORDER — VANCOMYCIN HCL 10 G IV SOLR
2000.0000 mg | Freq: Once | INTRAVENOUS | Status: AC
  Administered 2017-05-28: 2000 mg via INTRAVENOUS
  Filled 2017-05-28 (×2): qty 2000

## 2017-05-28 NOTE — Progress Notes (Signed)
Cortrak placed and abdominal xray completed.  Discussed abdominal xray results with PA Tillery as cortrak doesn't appear to be post pyoloric.  PA Tillery spoke with MD Mclean and advised it is okay to use cortrak as is but would like cortrak team to try and advance to post pyloric if they are able.  Spoke with cortrak team and they will attempt to advance cortrak.

## 2017-05-28 NOTE — Progress Notes (Signed)
PT Cancellation Note  Patient Details Name: Edd FabianHarry D Wagenaar MRN: [id number] DOB: 1960/12/07   Cancelled Treatment:    Reason Eval/Treat Not Completed: Medical issues which prohibited therapy.  Pt is presently intubated, febrile, with continued change in MS, and to undergo long term EEG.  Will see when able. 05/28/2017  [place] BingKen Joeangel Jeanpaul, PT [phone]  (pager)   Eliseo GumKenneth V Luara Faye 05/28/2017, 6:13 PM

## 2017-05-28 NOTE — Progress Notes (Signed)
Received page from bedside nurse stating MAP was 84. Instructed to continue to monitor.   Marcellus ScottLesley Darielys Giglia RN, VAD Coordinator 24/7 pager [phone]

## 2017-05-28 NOTE — Progress Notes (Signed)
ANTICOAGULATION CONSULT NOTE - Follow Up Consult  Pharmacy Consult for Warfarin  Indication: HVAD  No Active Allergies  Patient Measurements: Height: 6' (182.9 cm) Weight: 235 lb 11.2 oz (106.9 kg) IBW/kg (Calculated) : 77.6  Vital Signs: Temp: 99.9 F (37.7 C) (08/08 1100) Temp Source: Axillary (08/08 1100) BP: 114/87 (08/08 1200) Pulse Rate: 65 (08/08 1130)  Labs:  Recent Labs  05/26/17 0413 05/27/17 0419 05/28/17 0503  HGB 7.5* 9.5* 9.6*  HCT 23.4* 29.2* 30.2*  PLT 293 289 300  LABPROT 23.4* 22.4* 23.8*  INR 2.05 1.93 2.09  CREATININE 1.98* 2.04* 2.16*    Estimated Creatinine Clearance: 49.4 mL/min (A) (by C-G formula based on SCr of 2.16 mg/dL (H)).   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 He developed an ileus on 7/5, warfarin was held, and heparin bridge started once INR fell below 1.8.  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    Warfarin resumed 7/18 without heparin bridge.  Altered mental status continues this morning, low 02 sats and patient transferred to ICU and intubated. D/w HF  Team will hold warfarin for now and start heparin once INR has trended down to <1.8. INR currently 2.09 with dose given last night. CBC stable, LDH stable.  Febrile overnight, +u/a, no results from culture data. Started on ceftriaxone overnight but will broaden to vancomycin and meropenem this am. Concern for early sepsis or aspiration with lots of secretions noted during intubation.  Altered mental status/asterixis: discussed possibility of drug induced with HF team, EP, and neuro. Mexiletine and amiodarone on short list of possible drug induced causes mexiletine indicating a 10-20% risk of tremors and ataxia. Doses of amio and mexiletine have both been reduced to try and help, EP feels changes in mental status is out of proportion of what would be likely seen with mexiletine. In the end  very difficult situation as patient has recurrent VT which led to being put on multiple antiarrythmic agents. Will continue to monitor, consider trial of weaning off meds as able.   Goal of Therapy:  Heparin level goal 0.3-0.5 INR 2-2.5 Monitor platelets by anticoagulation protocol: Yes   Plan:  1) Hold warfarin for now 2) start heparin once INR<1.8 3) Load vancomycin and start meropenem 4) Continue to follow mental status and antiarrythmic agents  Sheppard CoilFrank Wilson PharmD., BCPS Clinical Pharmacist Pager [phone] 05/28/2017 1:22 PM

## 2017-05-28 NOTE — Procedures (Addendum)
Intubation Procedure Note James [name] [id number] 02-Apr-1961  Procedure: Intubation Indications: Respiratory insufficiency  Procedure Details Consent: Unable to obtain consent because of emergent medical necessity. Time Out: Verified patient identification, verified procedure, site/side was marked, verified correct patient position, special equipment/implants available, medications/allergies/relevent history reviewed, required imaging and test results available.  Performed  Maximum sterile technique was used including gloves and gown.  Glidescope 4, grade 1 view.   Thick secretions noted in oropharynx and above vocal cords consistent with aspiration.  Evaluation Hemodynamic Status: BP stable throughout; O2 sats: stable throughout Patient's Current Condition: stable Complications: No apparent complications Patient did tolerate procedure well. Chest X-ray ordered to verify placement.  CXR: pending.   James [name] 05/28/2017

## 2017-05-28 NOTE — Progress Notes (Signed)
OT Cancellation Note  Patient Details Name: Edd FabianHarry D Pokorny MRN: [id number] DOB: 09-10-1961   Cancelled Treatment:    Reason Eval/Treat Not Completed: Medical issues which prohibited therapy.  Pt intubated, febrile, continued change in MS, and to undergo long term EEG.  Will check on him tomorrow to determine appropriateness for at least bed level exercise.  Desma Wilkowski Heberonarpe, OTR/L [phone]   [name], Tiffanee Mcnee M 05/28/2017, 11:18 AM

## 2017-05-28 NOTE — Telephone Encounter (Signed)
Received page from [facility] informing VAD team that patient has a bed on 2H and will be transporting to ICU level at this time.  Marcellus ScottLesley Wilson RN, VAD Coordinator 24/7 pager [phone]

## 2017-05-28 NOTE — Progress Notes (Signed)
Received page from bedside nurse stating that patient had a temp of 101.8. Order given to give Tylenol 650 mg every 8 hours as needed. Blood and urine cultures sent and antibiotics started for possible UTI 05/27/17. Will monitor.  Marcellus ScottLesley Kiowa Hollar RN, VAD Coordinator 24/7 pager [phone]

## 2017-05-28 NOTE — Progress Notes (Signed)
PULMONARY / CRITICAL CARE MEDICINE   Name: James [name] MRN: [id number] DOB: 06/19/1961    ADMISSION DATE:  04/04/2017 CONSULTATION DATE:  04/23/2017  REFERRING MD:  Laneta SimmersBartle - CVTS  CHIEF COMPLAINT:  Acute respiratory failure  BRIEF SUMMARY:   [name] with CHF (EF 15%), AICD, CAD, Gout, HTN, CKD, DM, Anemia, admitted 04/14/2017 for VAD placement, as he had already been turned down by [facility] for transplant due to his gout and limitations with his mobility. Now s/p HVAD placement for his ischemic cardiomyopathy. His course has been complicated by AKI-on-CKD, and also complicated by pre-op Afib with RVR. Developed acute hypoxic respiratory failure requiring 15L O2 NRB, and he developed worsening respiratory distress, prompting ICU consult.  He was intubated 7/5 for respiratory arrest, aspiration PNA > developed septic shock / required vasopressors.   Extubated 7/6.  He was reintubated again on 7/17 when he decompensated during EGD. He was extubated on 7/18.  Hospital course complicated by recurrent episodes of SVT, SVT, GI bleed. On the morning of 8/8 he became more somnolent with altered mental status, increased work of breathing. NG tube was noted to be displaced with likely aspiration. PCCM consulted for help with management.  SUBJECTIVE:   Transferred to 2 H for worsening respiratory failure, likely aspiration of tube feeds. At the time of my examination he was in obvious respiratory distress with increased work of breathing, diffuse rhonchi. Decision made to proceed with intubation.  VITAL SIGNS: BP 126/87   Pulse (!) 46   Temp 99.7 F (37.6 C) (Axillary)   Resp (!) 37   Ht 6' (1.829 m)   Wt 235 lb 11.2 oz (106.9 kg)   SpO2 94%   BMI 31.97 kg/m   HEMODYNAMICS: CVP:  [7 mmHg-11 mmHg] 7 mmHg  VENTILATOR SETTINGS:    INTAKE / OUTPUT: I/O last 3 completed shifts: In: 2435 [P.O.:100; I.V.:1597.8; NG/GT:687.2; IV Piggyback:50] Out: 0865 [HQION:[id number] [Urine:1975; Stool:1]  PHYSICAL EXAMINATION: Gen:       Moderate to severe distress, chronically ill appearing man HEENT:  EOMI, sclera anicteric Neck:     No masses; no thyromegaly Lungs:    Diffuse B/L rhonchi; increased respiratory effort CV:         Regular rate and rhythm; no murmurs Abd:      + bowel sounds; soft, non-tender; no palpable masses, no distension Ext:    No edema; adequate peripheral perfusion Skin:      Warm and dry; no rash Neuro: Altered, somnolent, arousable  LABS:  BMET  Recent Labs Lab 05/26/17 0413 05/27/17 0419 05/28/17 0503  NA 137 137 137  K 4.2 4.2 4.4  CL 108 106 108  CO2 22 22 20*  BUN 45* 47* 47*  CREATININE 1.98* 2.04* 2.16*  GLUCOSE 150* 145* 111*   Electrolytes  Recent Labs Lab 05/26/17 0413 05/27/17 0419 05/28/17 0503  CALCIUM 7.7* 8.0* 7.9*  MG 2.0 1.9 2.1   CBC  Recent Labs Lab 05/26/17 0413 05/27/17 0419 05/28/17 0503  WBC 17.8* 15.2* 16.5*  HGB 7.5* 9.5* 9.6*  HCT 23.4* 29.2* 30.2*  PLT 293 289 300   Coag's  Recent Labs Lab 05/26/17 0413 05/27/17 0419 05/28/17 0503  INR 2.05 1.93 2.09   Sepsis Markers No results for input(s): LATICACIDVEN, PROCALCITON, O2SATVEN in the last 168 hours. ABG No results for input(s): PHART, PCO2ART, PO2ART in the last 168 hours. Liver Enzymes No results for input(s): AST, ALT, ALKPHOS, BILITOT, ALBUMIN in the last 168 hours.  Cardiac Enzymes No results  for input(s): TROPONINI, PROBNP in the last 168 hours.  Glucose  Recent Labs Lab 05/27/17 1625 05/27/17 2025 05/28/17 0107 05/28/17 0435 05/28/17 0745 05/28/17 0850  GLUCAP 82 79 84 116* 150* 108*    Imaging Ct Head Wo Contrast  Result Date: 05/27/2017 CLINICAL DATA:  56 year old male with episode of sudden onset decreased level of consciousness today. Improved mental status now. EXAM: CT HEAD WITHOUT CONTRAST TECHNIQUE: Contiguous axial images were obtained from the base of the skull through the vertex without intravenous contrast. COMPARISON:  05/08/2017. FINDINGS:  Brain: Mild scaphocephaly. No midline shift, ventriculomegaly,evidence of mass lesion or evidence of cortically based acute infarction. No hyperdense intracranial hemorrhage, but suggestion of stable bilateral hemisphere extra-axial collections measuring 3-4 mm bilaterally. See coronal images 32 and 33. These appear unchanged except for possible increased density. No significant mass effect. Gray-white matter differentiation appears stable in both hemispheres, with patchy cerebral white matter hypodensity, and probable small chronic lacunar infarct in the right thalamus. Stable small chronic lacunar infarct in the left cerebellar hemisphere (coronal image 58), but increased hypodensity in the right superior cerebellum suggesting interval lacunar infarcts in the right SCA territory. These have a chronic appearance today. Vascular: Calcified atherosclerosis at the skull base. No suspicious intracranial vascular hyperdensity. Skull: Stable.  No acute osseous abnormality identified. Sinuses/Orbits: Clear aside from continued mild mucosal thickening in the left maxillary sinus, now with some bubbly opacity. Other: No acute orbit or scalp soft tissue findings. IMPRESSION: 1. Small bilateral subdural hygromas or hematomas, but not significantly changed since July and clinical significance unclear. 2. Right superior cerebellar artery lacunar infarcts have become apparent since 05/08/2017, but have a chronic appearance today. 3. 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 4. In light of #1 and #2, follow-up brain MRI may be valuable. Electronically Signed   By: Odessa Fleming M.D.   On: 05/27/2017 11:25   Dg Chest Port 1 View  Result Date: 05/28/2017 CLINICAL DATA:  Fever EXAM: PORTABLE CHEST 1 VIEW COMPARISON:  05/27/2017 FINDINGS: Hypoventilation with decreased lung volume. Progression of left lower lobe airspace disease. No heart failure or effusion. NG tube enters the  stomach. Right arm PICC tip in the SVC unchanged IMPRESSION: Hypoventilation. Progression of left lower lobe atelectasis/infiltrate. Electronically Signed   By: Marlan Palau M.D.   On: 05/28/2017 09:16   Dg Abd Portable 1v  Result Date: 05/27/2017 CLINICAL DATA:  Feeding tube placement EXAM: PORTABLE ABDOMEN - 1 VIEW COMPARISON:  May 14, 2017 FINDINGS: There is a nasogastric tube with tip and side-port over the proximal stomach. A central line tip is seen over the right atrium. LVAD and pacer lead is present. Diffuse gaseous distention of bowel, both colon and small bowel. Lung bases are clear. IMPRESSION: 1. Nasogastric tube with tip over the proximal stomach. 2. Diffuse gaseous distention of bowel, correlate for ileus symptoms. Electronically Signed   By: Marnee Spring M.D.   On: 05/27/2017 14:53     STUDIES:  None  CULTURES: Blood 7/5 >> neg Sputum 7/5 >>klebsieall, citrobacter Sputum 7/7 >> klebsiella - resistant to amp otherwise sens  ANTIBIOTICS: Vancomycin 7/5 >> 7/7 Zosyn 7/5 >> 7/12  SIGNIFICANT EVENTS: 7/05  Respiratory arrest post feculent vomitus episode 7/06  Extubated  7/17 Intubated during EGD 7/18 Extubated 8/6 Resp distress, aspirated. intubated  LINES/TUBES: ETT 7/5 >> 7/6, 7/May 14, 2017 Double lumen PICC on the right 7/2 >>  ASSESSMENT / PLAN: [name] with CHF (EF 15%), AICD, CAD, Gout,  HTN, CKD, DM, Anemia, admitted 03/28/2017 for VAD placement, now s/p HVAD placement, with course complicated by AKI-on-CKD and pre-op Afib with RVR, GI bleed recurrent arrhythmia.  Decompensated on 8/8 requiring intubation.   PULMONARY A: Inability to protect airway Likely aspirated tube feeds P: Intubated Follow CXR, ABG Continue vanco, start zosyn Follow cultures, Pct  CARDIOVASCULAR A: Acute on Chronic Systolic CHF / ICM - [employer] ICD, turned down for transplant S/p HVAD Atrial Fibrillation  Reccurent VT Mediastinal Hematoma s/p Evacuation post LVAD CHF (EF 15%);  CAD; History HTN P: Per CVTS and Heart failure team  The patient is critically ill with multiple organ system failure and requires high complexity decision making for assessment and support, frequent evaluation and titration of therapies, advanced monitoring, review of radiographic studies and interpretation of complex data.   Critical Care Time devoted to patient care services, exclusive of separately billable procedures, described in this note is 35 minutes.   Chilton Greathouse MD [facility] Pager [phone] If no answer or after 3pm call: [phone] 05/28/2017, 10:14 AM

## 2017-05-28 NOTE — Progress Notes (Signed)
LVAD Inpatient Coordinator Rounding Note:  Admitted 04/09/2017 due to A/C Heart failure.   [employer] LVAD implanted on 03/27/2017 by Dr. Cyndia Bent as DT VAD.  Transferred to 4E 05/17/17.  Transferred back to ICU 2H09 on 05/28/17 with possible aspiration pneumonia requiring re-intubation.   Vital signs: Temp: 101.6 HR: 70 Doppler:  Auto BP: 101/75 O2 Sat: 100% on 40% FIO2 and 5 peep Wt in lbs:  238>241>251>246>241>243>249>248>248>240>230>229>226>224>235  LVAD interrogation reveals:  Speed: 2600 Flow: 4.6 Power: 3.8 w Alarms: suction x 1 on 05/22/17 Peak: 7.4 Trough: 1.7 HCT: 30  Low flow alarm setting: 2.5 High watt alarm setting: 6  Suction: on  Lavare cycle: on  Blood Products: 6/28> 5 PRBC's, 6 FFP 7/1> 1 PRBC 7/9> 1 PRBC 7/13>3 PRBC 7/14>3 PRBC 7/15> 1 PRBC 7/16>3PRBC 7/17> 3 PRBC 7/20> 1 PRBC 8/6> 1 PRBC  Gtts: Milrinone restarted 7/8, stopped 7/16 Levo stopped 04/26/17 restarted 04/29/2017 - stopped 05/07/17 Amiodarone stopped 04/29/17, restarted 7/17 - transitioned to PO 05/13/17; VT on 05/15/17 - IV amio re-started - 30 mg/hr stopped 05/16/17 Heparin stopped 04/29/17 Lidocaine 05/07/17 - stopped 7/19  TPN - started 04/24/17 for nutritional support, stopped 04/30/17  NG inserted with tube feeding started 05/27/17; placed on hold 05/28/17  Cortrak inserted 06/07/17  Neuro: 05/27/17- Neurology consult for lethargy, mouth tremors, not vocal, not following commands> Head CT and EEG 05/28/17 - Continual EEG monitoring    Arrhythmia: 04/24/17 - Afib with RVR - started amiodarone 05/02/17 - 2 sustained episodes of VT - cardioverted x 1; second broke with overdrive pacing 0/56- wide complex tachycardia- Amio bolus x3 and gtt at 60 mg/hr 05/07/17 - sustained VT - converted with overdrive pacing; Lidocaine started in addition to amiodarone 05/08/17 - Lidocaine stopped due to confusion (lido level 12.4) 05/14/17 - sustained VT - converted 3 separate occassions with overdrive  pacing 9/79/48 - sustained VT-converted with overdrive pacing  0/1/65- Afib rate in the 50's 05/27/17 - NSR   Respiratory: 04/23/17 - re-intubated due to respiratory failure secondary to suspected aspiration pneumonia 04/25/17-extubated 05/20/2017- intubated for EGD 05/04/2017- extubated 05/28/17 - re-intubated for suspected aspiration pneumonia  Drive Line: Daily Dressing Kits with Aquacell AG silver strips per protocol - will advance to every other day dressing changes. Next dressing change due 05/30/17 unless drainage occurs; if so, please change as needed to keep dressing dry and intact.  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 He had epinephrine, APC, and 2 clips.  - 7/19 - Lidocaine gtt stopped due to confusion (lido toxicity) -8/7- Neurology consult- lethargy, mouth tremors, not vocal, not following commands> Head CT and EEG, blood and urine cultures sent, Ammonia-20   Plan/Recommendations:  1. Advance to every other day dressing changes using daily dressing kit; change more often if needed to keep dressing dry and intact. Wife perficient in performing dressing changes.  2. Please call VAD pager with patient and equipment concerns.   Zada Girt RN, VAD Coordinator 24/7 pager [phone]

## 2017-05-28 NOTE — Progress Notes (Signed)
Cortrak able to advance NG tube.  Abdominal xray completed during system downtime, RN awaiting results.  Called xray and no results at this time listed in their system.  MD Shirlee LatchMcLean advised he wants NG tube post-pyloric and to wait to start tube feeds until placement confirmed.  When placement confirmed, MD Shirlee LatchMcLean advised to start tube feed at 15 mL/hr and to advance by 10 mL/hr every 4 hours if patient tolerating well.

## 2017-05-28 NOTE — Progress Notes (Signed)
Cortrak Tube Team Note:  Consult received to place a Cortrak feeding tube.   A 10 F Cortrak tube was placed in the right nare and secured with a nasal bridle at 92 cm. Per the Cortrak monitor reading the tube tip is post pyloric.    X-ray is required, abdominal x-ray has been ordered by the Cortrak team. Please confirm tube placement before using the Cortrak tube.   If the tube becomes dislodged please keep the tube and contact the Cortrak team at [email or web address] (password [email or web address]) for replacement.  If after hours and replacement cannot be delayed, place a NG tube and confirm placement with an abdominal x-ray.    Vanessa Kickarly Itza Maniaci RD, LDN Clinical Nutrition Pager # [phone]- [phone]

## 2017-05-28 NOTE — Progress Notes (Signed)
Pt wife concerned that her husband did not receive his scheduled IV antibiotic during the previous shift, she reports that she was at his bedside all-day but she never saw the IV antibiotic started or was informed by the RN that he received an antibiotic. MAR shows IV antibiotic Rocephin documented at 1750.  Discussed dilemma with pharmacy to inquire about when medication was delivered to unit.  Wife informed of documentation of IV antibiotic given in pt's MAR, inability to administer an additional dosage at this time, and next scheduled dose on 05/28/17.  Wife is agreeable after receiving information, RN will continue to address family concerns as needed.        Mila PalmerJohnson,Giulliana Mcroberts A, RN 05/27/17

## 2017-05-28 NOTE — Progress Notes (Signed)
   I called and spoke to his wife, Elease HashimotoMarcie to provide an update that he required intubation to protection.   She verbalized understanding.   I will update her again later today.   Amey Hossain NP-C  12:58 PM

## 2017-05-28 NOTE — Progress Notes (Signed)
Patient apparently aspirated. He was febrile overnight. He was hypoxemic on cooximetry this morning around 5 AM. On exam, he was just recently paralyzed with rocuronium, but he has subtle twitching which could be a polymyoclonus. My suspicion at this time is that this represents a septic encephalopathy, but given the unusual character and previous episode of altered mental status as well, I would favor ruling out intermittent seizures as an etiology with a long-term EEG. I also asked pharmacy for a review of medications which may be contributing as well.  1) overnight EEG 2) treatment of infection per primary team and CCM 3) neurology will continue to follow  Ritta SlotMcNeill Darling Cieslewicz, MD [facility] [phone]  If 7pm- 7am, please page neurology on call as listed in AMION.

## 2017-05-28 NOTE — Progress Notes (Signed)
Progress Note  Patient Name: James [name] Date of Encounter: 05/28/2017  Primary Cardiologist: Dr. Shirlee Latch  Subjective   JEys open, has NG in place, does not respond to verbal or touch  Inpatient Medications    Scheduled Meds: . amiodarone  200 mg Oral BID  . amLODipine  10 mg Oral Daily  . febuxostat  120 mg Oral Daily  . hydrALAZINE  100 mg Oral Q8H  . insulin aspart  0-15 Units Subcutaneous TID WC  . isosorbide mononitrate  30 mg Oral Daily  . magnesium oxide  400 mg Oral Daily  . mexiletine  300 mg Oral Q12H  . mirtazapine  15 mg Oral QHS  . pantoprazole  40 mg Oral BID  . potassium chloride  40 mEq Oral Daily  . sodium chloride flush  10-40 mL Intracatheter Q12H  . sotalol  80 mg Oral Daily  . spironolactone  25 mg Oral Daily  . Warfarin - Pharmacist Dosing Inpatient   Does not apply q1800   Continuous Infusions: . sodium chloride Stopped (05/02/17 0630)  . sodium chloride 10 mL/hr at 05/16/17 1800  . sodium chloride    . cefTRIAXone (ROCEPHIN)  IV Stopped (05/27/17 1820)  . feeding supplement (GLUCERNA 1.2 CAL) 1,000 mL (05/28/17 0705)   PRN Meds: acetaminophen (TYLENOL) oral liquid 160 mg/5 mL, albuterol, docusate, hydrALAZINE, neomycin-bacitracin-polymyxin, ondansetron (ZOFRAN) IV, oxyCODONE, phenol, sodium chloride flush, traMADol   Vital Signs    Vitals:   05/27/17 2221 05/27/17 2319 05/28/17 0103 05/28/17 0438  BP: 97/73  90/79 (!) 84/67  Pulse: 73  (!) 119 68  Resp: (!) 24  (!) 30 (!) 30  Temp: (!) 101.1 F (38.4 C) (!) 100.8 F (38.2 C) (!) 101.6 F (38.7 C) (!) 100.6 F (38.1 C)  TempSrc: Axillary Axillary Axillary Axillary  SpO2: 94%  94% 93%  Weight:    235 lb 11.2 oz (106.9 kg)  Height:        Intake/Output Summary (Last 24 hours) at 05/28/17 0749 Last data filed at 05/28/17 0709  Gross per 24 hour  Intake             2335 ml  Output             1176 ml  Net             1159 ml   Filed Weights   05/26/17 0700 05/27/17 0536 05/28/17  0438  Weight: 184 lb 8 oz (83.7 kg) 224 lb (101.6 kg) 235 lb 11.2 oz (106.9 kg)    Telemetry    SB/SR, 60's currently, PACs- Personally Reviewed  ECG    No new ekgs,  - Personally Reviewed  Physical Exam   GEN: eyes open, does not respond to verbal or touch Neck:  JVD Cardiac: LVAD hum.  Respiratory: Clear to auscultation bilaterally. GI: Soft, MS: marked edema Neuro:  not responsive Psych: unable to assess   Labs    Chemistry  Recent Labs Lab 05/26/17 0413 05/27/17 0419 05/28/17 0503  NA 137 137 137  K 4.2 4.2 4.4  CL 108 106 108  CO2 22 22 20*  GLUCOSE 150* 145* 111*  BUN 45* 47* 47*  CREATININE 1.98* 2.04* 2.16*  CALCIUM 7.7* 8.0* 7.9*  GFRNONAA 37* 35* 33*  GFRAA 42* 41* 38*  ANIONGAP 7 9 9      Hematology  Recent Labs Lab 05/26/17 0413 05/27/17 0419 05/28/17 0503  WBC 17.8* 15.2* 16.5*  RBC 2.59* 3.23* 3.40*  HGB  7.5* 9.5* 9.6*  HCT 23.4* 29.2* 30.2*  MCV 90.3 90.4 88.8  MCH 29.0 29.4 28.2  MCHC 32.1 32.5 31.8  RDW 18.4* 18.7* 18.3*  PLT 293 289 300    Cardiac EnzymesNo results for input(s): TROPONINI in the last 168 hours. No results for input(s): TROPIPOC in the last 168 hours.   BNP  Recent Labs Lab 05/22/17 0434  BNP 1,523.2*     DDimer No results for input(s): DDIMER in the last 168 hours.   Radiology    Ct Head Wo Contrast Result Date: 05/27/2017 CLINICAL DATA:  56 year old male with episode of sudden onset decreased level of consciousness today. Improved mental status now. EXAM: CT HEAD WITHOUT CONTRAST TECHNIQUE: Contiguous axial images were obtained from the base of the skull through the vertex without intravenous contrast. COMPARISON:  05/08/2017. FINDINGS: Brain: Mild scaphocephaly. No midline shift, ventriculomegaly,evidence of mass lesion or evidence of cortically based acute infarction. No hyperdense intracranial hemorrhage, but suggestion of stable bilateral hemisphere extra-axial collections measuring 3-4 mm bilaterally.  See coronal images 32 and 33. These appear unchanged except for possible increased density. No significant mass effect. Gray-white matter differentiation appears stable in both hemispheres, with patchy cerebral white matter hypodensity, and probable small chronic lacunar infarct in the right thalamus. Stable small chronic lacunar infarct in the left cerebellar hemisphere (coronal image 58), but increased hypodensity in the right superior cerebellum suggesting interval lacunar infarcts in the right SCA territory. These have a chronic appearance today. Vascular: Calcified atherosclerosis at the skull base. No suspicious intracranial vascular hyperdensity. Skull: Stable.  No acute osseous abnormality identified. Sinuses/Orbits: Clear aside from continued mild mucosal thickening in the left maxillary sinus, now with some bubbly opacity. Other: No acute orbit or scalp soft tissue findings. IMPRESSION: 1. Small bilateral subdural hygromas or hematomas, but not significantly changed since July and clinical significance unclear. 2. Right superior cerebellar artery lacunar infarcts have become apparent since 05/08/2017, but have a chronic appearance today. 3. 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 4. In light of #1 and #2, follow-up brain MRI may be valuable. Electronically Signed   By: Odessa Fleming M.D.   On: 05/27/2017 11:25   Dg Chest Port 1 View Result Date: 05/27/2017 CLINICAL DATA:  Dyspnea. EXAM: PORTABLE CHEST 1 VIEW COMPARISON:  Radiographs May 18, 2017. FINDINGS: Stable cardiomediastinal silhouette. Surgical staples are noted. Stable position of left ventricular assistance device. Single lead left-sided pacemaker is unchanged in position. Elevated left hemidiaphragm is again noted. Right-sided PICC line is unchanged in position. No pneumothorax or pleural effusion is noted. No acute pulmonary disease is noted. Bony thorax is unremarkable. IMPRESSION: Stable  support apparatus as described above. No acute abnormality noted currently. Electronically Signed   By: Lupita Raider, M.D.   On: 05/27/2017 09:29   Dg Abd Portable 1v Result Date: 05/27/2017 CLINICAL DATA:  Feeding tube placement EXAM: PORTABLE ABDOMEN - 1 VIEW COMPARISON:  05/09/2017 FINDINGS: There is a nasogastric tube with tip and side-port over the proximal stomach. A central line tip is seen over the right atrium. LVAD and pacer lead is present. Diffuse gaseous distention of bowel, both colon and small bowel. Lung bases are clear. IMPRESSION: 1. Nasogastric tube with tip over the proximal stomach. 2. Diffuse gaseous distention of bowel, correlate for ileus symptoms. Electronically Signed   By: Marnee Spring M.D.   On: 05/27/2017 14:53    Cardiac Studies   RHC 04/10/2017  RA mean  20 RV 71/22 PA 70/31 mean 45 PCWP mean 24 CVP/PCWP 0.83 PAPi 1.95 Oxygen saturations: PA 49% AO 100% Cardiac Output (Fick) 4.95  Cardiac Index (Fick) 2.28 PVR 4.24 WU Cardiac Output (Thermo) 5.74 Cardiac Index (Fick) 2.65 PVR 3.65 WU  EHCO 04/08/2017 EF 15%  Patient Profile     56 y.o. male history of gout, DMII, HTN, CKD, CAD S/P CABGx6 2012, chronic systolic heart failure and St jude ICD admitted 04/10/2017 s/p LVAD 2017-07-02, post op complications w/ileus, LGIB requiring numerous transfusions with AVMs, s/p epinephrine, APC, and 2 clips with GI, he has had PAF, and recurrent issues with recurrent sustained VT having to be pace terminated multiple times, on high dose amiodarone and mexiletine (AMS w/lidocaine), did well for a few weeks though last week more VT and Sotalol added.  Yesterday with AMS and mexiletine was down-titrated  Assessment & Plan    1. Recurrent VT     Very difficult case with numerous co-morbid conditions/issues      AMS yesterday, Dr. Elberta Fortisamnitz in d/w Dr. Shirlee LatchMcLean, today's AMS apparently unchanged from yesterday.  Feel out of proportion to mexilitine, though agree with lowered dose    WBC trending upwards Neurology on case Abn UA INR yesterday 1.93, today 2.09  Omega Slager defer ongoing management and eval to primary team  2. Acute/chronic CHF (systolic) w/ LVAD 3. CAD 4. PAF 5. AKI/CRD  Signed, Sheilah Pigeonenee Lynn Ursuy, PA-C  05/28/2017, 7:49 AM    I have seen and examined this patient with Francis Dowseenee Ursuy.  Agree with above, note added to reflect my findings.  On exam, RRR, LVAD hum, lungs clear. Patient with altered mental status since yesterday. Mexiletine has been decreased to twice a day from 3 times a day. Currently workup undergoing by neurology and primary team. Would continue antiarrhythmic medications at their current doses.    Desa Rech M. Arnel Wymer MD 05/28/2017 10:04 AM

## 2017-05-28 NOTE — Progress Notes (Signed)
As per report from night shift RN pt has been running fever this morning and through the night (see vitals), axillary temp assessed 99.7 s/p tylenol given by RN @ [phone].  Pt continues to be non-responsive, eyes opening, but not responding to simple directions, gurgling sounds coming from throat, pt suctioned.  SpO2 88-90% on RA, 2L [place] applied, O2 up to 94%.     Charge nurse asked to evaluate pt for possible transfer.  Rapid response also called in for assessment.  ALanna Poche. Tillery, PA at bedside as RR was being called.  Tube feedings stopped and disconnected from NG tube, received orders for CXR and transfer to 2H.  Royetta AsalL. Wilson, LVAD coordinator, en route as well.

## 2017-05-28 NOTE — Progress Notes (Signed)
Nutrition Follow Up   DOCUMENTATION CODES:   Severe malnutrition in context of chronic illness  INTERVENTION:    Once Cortrak tube placed, restart Glucerna 1.2 formula at 25 ml/hr and increase by 10 ml every 4 hours to goal rate of 75 ml/hr  TF regimen to provide 2160 kcals, 108 gm protein, 1449 ml of free water  NUTRITION DIAGNOSIS:   Malnutrition (severe) related to chronic illness (CHF) as evidenced by severe depletion of body fat, severe depletion of muscle mass, ongoing  GOAL:   Patient will meet greater than or equal to 90% of their needs, progressing  MONITOR:   Vent status, TF tolerance, Diet advancement, PO intake, Labs, Weight trends, Skin, I & O's  ASSESSMENT:   56 yo male with hx of HTN, gout, obesity, chronic edema, CAD, ischemic cardiomyopathy, DM2, AICD, MI, CHF, renal insufficiency who was admitted on 6/22 with acute on chronic systolic heart failure.  6/28  HVAD placement 6/29  Extubated 7/01  Soft diet started, Ensure Enlive added 7/04  Re-intubated 7/05  TPN started 7/06  Extubated 7/11  TPN D/C'd 8/07  Large bore NGT placed, TF started  Patient is currently intubated on ventilator support MV: 7.5 L/min Temp (24hrs), Avg:100.6 F (38.1 C), Min:99.7 F (37.6 C), Max:101.6 F (38.7 C)  Pt transferred back to 2H for worsening respiratory failure, likely aspiration of TF. Plan is for removal of NGT and placement of Cortrak small bore tube. Labs and medications reviewed. CBG's 150-108-102. Spoke with RN.  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 highly skewed   Estimated Nutritional Needs:   Kcal:  2236  Protein:  115-130 gm  Fluid:  per MD  EDUCATION NEEDS:   No education needs identified at this time  Maureen ChattersKatie Tyteanna Ost, RD, LDN Pager #: [phone] After-Hours Pager #: [phone]

## 2017-05-28 NOTE — Progress Notes (Signed)
LTM EEG started 

## 2017-05-28 NOTE — Progress Notes (Addendum)
Patient ID: James [name], male   DOB: 11-22-1960, 56 y.o.   MRN: [id number]   Advanced Heart Failure VAD Team Note  Subjective:    Events: --HVAD placed 6/28.  Returned to the OR that evening with high chest tube output, evacuation of mediastinal hematoma.  --Extubated 6/29. Milrinone stopped on 7/4. --7/4, patient developed ileus with respiratory compromise. NGT placed with 3 L suctioned out.  CXR with suspicion for aspiration PNA.  Patient had to be intubated.  He went into atrial fibrillation with RVR.  He became hypotensive and was started on norepinephrine and phenylephrine.  Amiodarone gtt begun.    --Extubated again on 7/6.  -- 7/13 Had 2 sustained episodes of VT. Had to be cardioverted 1. Second episode broke with overdrive pacing with Dr. Graciela Husbands. VAD speed turned down to 2700.  --Melena due to acuteGI bleed --> 2 units PRBCs 7/14, 2 units 7/15. 3 U PRBCs 7/16,  05/12/2017 3 UPRBCs.  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  He had epinephrine, APC, and 2 clips. Intubated prior to procedure and placed on norepinephrine. Speed dropped to 2660.  --7/18 Back in VT with overdrive pacing. Lidocaine was started in addition to amiodarone. Extubated. --7/19 lidocaine stopped with confusion. Neuro consulted.  EEG normal.  CT of head no acute findings. 7/20 confusion had resolved.  --1 unit PRBCs 7/20.  --7/22, developed sustained VT with rate around 130 shortly after getting IV Lasix.  We were able to pace him out and back to NSR.  He was put back on amiodarone gtt.  --More VT on 7/25, paced out.  Back on IV amiodarone gtt.  NSR.  -- 7/31 Ramp ECHO decrease speed 2600 rpm.  --8/1 VT- paced out --8/2 VT - started on sotalol --8/6 Hgb 7.5, 1 unit PRBCs - 8/7 Hgb 9.5, confused.  Head CT with right superior cerebellar artery lacunar infarcts, not acute.   Mental status worsened today. Slouched in bed. Intermittently follows commands. Does not  respond to questioning. Tmax 101.6. Blood and UCx pending. WBC up to 16.5  HVAD INTERROGATION:  HVAD:  Flow 4.4 liters/min, speed 2600,  power 4.0 W,  Peak 7.4 Trough 1.9  Suction On. Lavare On. No alarms.    Objective:    Vital Signs:   Temp:  [100.6 F (38.1 C)-101.6 F (38.7 C)] 100.6 F (38.1 C) (08/08 0438) Pulse Rate:  [40-149] 68 (08/08 0438) Resp:  [24-35] 30 (08/08 0438) BP: (84-97)/(67-79) 84/67 (08/08 0438) SpO2:  [92 %-97 %] 93 % (08/08 0438) Weight:  [235 lb 11.2 oz (106.9 kg)] 235 lb 11.2 oz (106.9 kg) (08/08 0438) Last BM Date: 05/27/17 Mean arterial Pressure 80-90s Intake/Output:   Intake/Output Summary (Last 24 hours) at 05/28/17 0824 Last data filed at 05/28/17 0709  Gross per 24 hour  Intake             2335 ml  Output             1176 ml  Net             1159 ml     Physical Exam  CVP 9-10 General:  Minimally responsive. In bed.   HEENT: Mouth agape. Feeding tube in place. Pupils equal. Does not follow person or finger with gaze. Neck: supple. JVP 8-9 cm at least. Carotids 2+ bilat; no bruits. No lymphadenopathy or thryomegaly appreciated. Cor: PMI nondisplaced. Regular rate & rhythm. No rubs, gallops or murmurs. Lungs: Extensive rhonchi  anteriorly. Diminished throughout. Abdomen: Soft, nondistended. No hepatosplenomegaly. No bruits or masses. Good bowel sounds. Extremities: no cyanosis, clubbing, rash, R and LLE 2+ edema. RUE PICC Neuro: Minimally responsive. Only intermittently follows commands.   Skin: Hot to the touch    Telemetry   Personally reviewed, NSR 60s    Labs   Basic Metabolic Panel:  Recent Labs Lab 05/24/17 0426 05/25/17 0347 05/26/17 0413 05/27/17 0419 05/28/17 0503  NA 137 135 137 137 137  K 3.5 3.6 4.2 4.2 4.4  CL 110 106 108 106 108  CO2 24 23 22 22  20*  GLUCOSE 159* 135* 150* 145* 111*  BUN 46* 44* 45* 47* 47*  CREATININE 1.75* 1.73* 1.98* 2.04* 2.16*  CALCIUM 7.6* 7.8* 7.7* 8.0* 7.9*  MG 1.4* 1.7 2.0 1.9 2.1     Liver Function Tests: No results for input(s): AST, ALT, ALKPHOS, BILITOT, PROT, ALBUMIN in the last 168 hours. No results for input(s): LIPASE, AMYLASE in the last 168 hours.  Recent Labs Lab 05/27/17 0800  AMMONIA 20    CBC:  Recent Labs Lab 05/24/17 0426 05/25/17 0347 05/26/17 0413 05/27/17 0419 05/28/17 0503  WBC 12.5* 12.8* 17.8* 15.2* 16.5*  NEUTROABS  --   --  15.8*  --   --   HGB 8.0* 8.1* 7.5* 9.5* 9.6*  HCT 24.7* 25.2* 23.4* 29.2* 30.2*  MCV 89.2 89.7 90.3 90.4 88.8  PLT 288 298 293 289 300    INR:  Recent Labs Lab 05/24/17 0426 05/25/17 0347 05/26/17 0413 05/27/17 0419 05/28/17 0503  INR 2.40 1.90 2.05 1.93 2.09    Other results:     Imaging   Ct Head Wo Contrast  Result Date: 05/27/2017 CLINICAL DATA:  56 year old male with episode of sudden onset decreased level of consciousness today. Improved mental status now. EXAM: CT HEAD WITHOUT CONTRAST TECHNIQUE: Contiguous axial images were obtained from the base of the skull through the vertex without intravenous contrast. COMPARISON:  05/08/2017. FINDINGS: Brain: Mild scaphocephaly. No midline shift, ventriculomegaly,evidence of mass lesion or evidence of cortically based acute infarction. No hyperdense intracranial hemorrhage, but suggestion of stable bilateral hemisphere extra-axial collections measuring 3-4 mm bilaterally. See coronal images 32 and 33. These appear unchanged except for possible increased density. No significant mass effect. Gray-white matter differentiation appears stable in both hemispheres, with patchy cerebral white matter hypodensity, and probable small chronic lacunar infarct in the right thalamus. Stable small chronic lacunar infarct in the left cerebellar hemisphere (coronal image 58), but increased hypodensity in the right superior cerebellum suggesting interval lacunar infarcts in the right SCA territory. These have a chronic appearance today. Vascular: Calcified atherosclerosis  at the skull base. No suspicious intracranial vascular hyperdensity. Skull: Stable.  No acute osseous abnormality identified. Sinuses/Orbits: Clear aside from continued mild mucosal thickening in the left maxillary sinus, now with some bubbly opacity. Other: No acute orbit or scalp soft tissue findings. IMPRESSION: 1. Small bilateral subdural hygromas or hematomas, but not significantly changed since July and clinical significance unclear. 2. Right superior cerebellar artery lacunar infarcts have become apparent since 05/08/2017, but have a chronic appearance today. 3. 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 4. In light of #1 and #2, follow-up brain MRI may be valuable. Electronically Signed   By: Odessa Fleming M.D.   On: 05/27/2017 11:25   Dg Chest Port 1 View  Result Date: 05/27/2017 CLINICAL DATA:  Dyspnea. EXAM: PORTABLE CHEST 1 VIEW COMPARISON:  Radiographs May 18, 2017. FINDINGS: Stable cardiomediastinal silhouette. Surgical staples are noted. Stable position of left ventricular assistance device. Single lead left-sided pacemaker is unchanged in position. Elevated left hemidiaphragm is again noted. Right-sided PICC line is unchanged in position. No pneumothorax or pleural effusion is noted. No acute pulmonary disease is noted. Bony thorax is unremarkable. IMPRESSION: Stable support apparatus as described above. No acute abnormality noted currently. Electronically Signed   By: Lupita RaiderJames  Green Jr, M.D.   On: 05/27/2017 09:29   Dg Abd Portable 1v  Result Date: 05/27/2017 CLINICAL DATA:  Feeding tube placement EXAM: PORTABLE ABDOMEN - 1 VIEW COMPARISON:  05/04/2017 FINDINGS: There is a nasogastric tube with tip and side-port over the proximal stomach. A central line tip is seen over the right atrium. LVAD and pacer lead is present. Diffuse gaseous distention of bowel, both colon and small bowel. Lung bases are clear. IMPRESSION: 1. Nasogastric tube with tip  over the proximal stomach. 2. Diffuse gaseous distention of bowel, correlate for ileus symptoms. Electronically Signed   By: Marnee SpringJonathon  Watts M.D.   On: 05/27/2017 14:53     Medications:     Scheduled Medications: . amiodarone  200 mg Oral BID  . amLODipine  10 mg Oral Daily  . febuxostat  120 mg Oral Daily  . hydrALAZINE  100 mg Oral Q8H  . insulin aspart  0-15 Units Subcutaneous TID WC  . isosorbide mononitrate  30 mg Oral Daily  . magnesium oxide  400 mg Oral Daily  . mexiletine  300 mg Oral Q12H  . mirtazapine  15 mg Oral QHS  . pantoprazole  40 mg Oral BID  . potassium chloride  40 mEq Oral Daily  . sodium chloride flush  10-40 mL Intracatheter Q12H  . sotalol  80 mg Oral Daily  . spironolactone  25 mg Oral Daily  . Warfarin - Pharmacist Dosing Inpatient   Does not apply q1800    Infusions: . sodium chloride Stopped (05/02/17 0630)  . sodium chloride 10 mL/hr at 05/16/17 1800  . sodium chloride    . cefTRIAXone (ROCEPHIN)  IV Stopped (05/27/17 1820)  . feeding supplement (GLUCERNA 1.2 CAL) 1,000 mL (05/28/17 0705)    PRN Medications: acetaminophen (TYLENOL) oral liquid 160 mg/5 mL, albuterol, docusate, hydrALAZINE, neomycin-bacitracin-polymyxin, ondansetron (ZOFRAN) IV, oxyCODONE, phenol, sodium chloride flush, traMADol   Patient Profile   56 yo with CAD s/p CABG, ischemic cardiomyopathy/chronic systolic CHF, tophaceous gout, and CKD stage 3 was admitted for diuresis and consideration for LVAD placement. S/p HVAD on 6/28  Assessment/Plan:    1. Acute/chronic systolic CHF s/p HVAD: Ischemic cardiomyopathy.  [employer] ICD.  Echo (6/18) with EF 15%, mildly dilated RV with moderately decreased systolic function.  s/p HVAD placement 6/28 and had to return to OR to evacuate mediastinal hematoma.  He had been weaned off pressors/milrinone, but developed ileus w/ likely aspiration event 7/4, re-intubated, developed afib/RVR requiring norepinephrine. Extubated 7/6.  VAD speed  turned down to 2660 with VT. VAD parameters currently look good.  Milrinone stopped 7/17 early am with recurrent VT.  Now off norepinephrine.   Maps ok.  - Coox 60% .   - CVP up to 9-10. Give 40 mg IV lasix.  - Pt ordered for spironolactone 25 daily, hydralazine 100 mg TID, imdur 60 daily, and amlodipine 10 mg daily. Wait confirmation of Feeding tube placement for po admin.  - MAP 96, With altered mental status concerned with po meds. Will continue what meds can go through tube and give  hydralazine 10 mg IV now.  - Off ASA with GI Bleed  - INR 2.09, Goal 2-2.5.  - Has been off dig/arb with elevated creatinine.     2. AKI on CKD stage 3: Suspect a component of peri-op ATN, worsened with ileus, vomiting, intubation/sedation.  - Creatinine trending up over past several days. Up to 2.16. Continue to follow closely.  3. Symptomatic anemia due to acute UGI bleeding:  Received 3 units PRBCs 7/16 and 3 units PRBCs on 05/14/2017. 1 unit PRBCs 7/20. 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  Requiring 2 clips + epi + APC. Does not appear to be bleeding actively now.  - Got Feraheme 7/28.  - Continue Protonix 40 po bid. - Back on warfarin, aiming for INR 2-2.5 range.  Will leave off aspirin for now. Dosing per pharm. - Transfuse hgb < 8  -On 8/6 received 1UPRBCs. Today hgb stable at 9.6. 4. Ventricular tachycardia: Status post ventricular tachycardia 2 on 7/13. Possible suction event. VAD speed turned down to 2700.  Recurrent VT 7/17. EP called to bedside. Overdrive pacing successful for about 10 minutes but VT recurred.  Milrinone turned off, remained in VT overnight but hemodynamically stable.  Speed decreased to 2660.  On 7/18, we were able to pace him out of VT.  Lidocaine added then stopped due to confusion/mental status changes.  He had VT 7/22 about 30 minutes after getting IV Lasix, had to be paced out again => may have been due to rapid fluid shift.  Pt paced out of  VT 3 separate occasions 7/25. Got amio bolus x 2 and started back on IV infusion.  Transitioned to amiodarone 400 mg tid.  On 8/1 had recurrent VT and paced out again.  Recurrent VT 8/2 per EP sotalol started along with amiodarone, sotalol decreased to once daily with bradycardia.   - No further VT.   - Continue mexiletine cut back to 300 mg BID. - Continue sotalol 80 mg daily. - Continue amio 200 mg BID     5. CAD: s/p CABG 2012. No S/S ischemia.  Off aspirin due to GI bleeding. No change.   6. Gout: Severe tophaceous gout. Left shoulder pain ?gout.   - Completed 3 days of prednisone.   - Continue uloric. NO change.  7. Atrial fibrillation: Developed atrial fibrillation with RVR in setting of aspiration PNA on 7/4.    Hard to know with interference from HMIII. Appears NSR.  8. Malnutrition: Now off TPN. Per Speech- FEEs performed. Prealbumin very low. Nutrition recommendations appreciated.  - Continue tube feeds. AMS this am limits oral intake.  9. Aspiration PNA with acute hypoxemic respiratory failure on 7/4: He was extubated on 7/6. Tracheal aspirate with Klebsiella and citrobacter. Both sensitive to Zosyn. Completed abx 7/13. WBC back up to 16.5 and febrile. Broadening ABX per MD.  Stat CXR. ? Recurrent aspiration. 10. Ileus: Resolved. 11. Deconditioning:  Plan for CIR when stable. OT/PT appreciated. CIR consulted    - Medical problems prohibiting CIR currently.  12. Acute Respiratory Failure: Intubated 7/17 in setting of complex GI intervention. Extubated 7/18.  - Desaturating into 80s now on RA. Adding back 02.  13. Delirium: Resolved. No further. Appreciate neuro input Possibly related to lidocaine.  Lidocaine stopped. CT of head negative. EEG ok. No further workup per neuro. Confused today. Suspect this is related to infection/fever.  - UA positive. Now on broad spectrum. Still febrile overnight. ABX broadened. - Ammonia negative.  -  Repeat CXR. CT head 05/27/17 with lacunar infarcts but  do not appear acute.   - Concern for aspiration overnight.  15. Insomnia: On remeron for sleep and depression. 16. Post-op depression: Remeron started 7/28.   Patient gradually worsened overnight. Will transfer back to 2H for closer watch.  Concerned that he re-aspirated. Will have CCM see as well to follow for airway protection. Will discuss with MD.    I reviewed the HVAD parameters from today, and compared the results to the patient's prior recorded data.  No programming changes were made.  The HVAD is functioning within specified parameters.    Graciella Freer, PA-C 05/28/2017, 8:24 AM  VAD Team --- VAD ISSUES ONLY--- Pager [phone] (7am - 7am) Advanced Heart Failure Team  Pager [phone] (M-F; 7a - 4p)  Please contact [facility] for night-coverage after hours (4p -7a ) and weekends on [email or web address]  Patient seen with PA, agree with the above note.  Patient had fever 101.5 last night, much more rhonchorous on lung exam today, plain film today suggests that NG tube has been pulled back and he was getting tube feeds.  Oxygen saturation is lower today.  I am concerned that he has aspirated again.  Also probably has UTI.  - Will move back to 2H.  - Holding oxygen saturation with [place] currently.  - Will give Lasix 40 mg IV x 1 with CVP 9-10.   - Broaden abx, vancomycin/meropenem.  - Would like to avoid intubation it at all possible, however he may have trouble defending his airway given altered mental status.  Will ask CCM to evaluate.   He is confused.  Awake but does not respond.  Somewhat like this yesterday but now worse.  Neurology saw, suspected toxic/metabolic encephalopathy.  Suspect related to infection/fever.   No further VT on sotalol.  Have cut back some on amiodarone and mexiletine with asterixis.  Will have to watch closely for recurrent VT with Lasix IV.  If no post-pyloric tube to use soon, can use IV amiodarone to prophylax VT.   Keep NPO for now and would remove  current NGT.  Would favor placing post-pyloric tube.  Will need IV hydralazine for the time being for BP control. Can give hydralazine 10 mg IV every 4 hrs for now.   Move back to 2H.   CRITICAL CARE Performed by: Marca Ancona  Total critical care time: 45 minutes  Critical care time was exclusive of separately billable procedures and treating other patients.  Critical care was necessary to treat or prevent imminent or life-threatening deterioration.  Critical care was time spent personally by me on the following activities: development of treatment plan with patient and/or surrogate as well as nursing, discussions with consultants, evaluation of patient's response to treatment, examination of patient, obtaining history from patient or surrogate, ordering and performing treatments and interventions, ordering and review of laboratory studies, ordering and review of radiographic studies, pulse oximetry and re-evaluation of patient's condition.  Marca Ancona 05/28/2017 9:15 AM

## 2017-05-28 NOTE — Progress Notes (Addendum)
LVAD Inpatient Coordinator Rounding Note:  Admitted 03/26/2017 due to A/C Heart failure.   [employer] LVAD implanted on 2017/10/16 by Dr. Laneta SimmersBartle as DT VAD.  Blood cultures and urine culture obtained 8/7   Vital signs: Temp: 101.8 overnight HR: 61 Doppler: 96 Auto BP: 87/67 (75) O2 Sat: 100% on vent Wt:207>209 > 213 >213 > 208>203>220>227lbs...........234>233>240>240>236>239>238>240>241>249>248>240>230>226>184(not performed standing)>224>235  LVAD interrogation reveals:  Speed: 2600 Flow: 4.4 Power: 4.0w Alarms:none Peak: 7.4 Trough: 1.9 HCT: 29 Low flow alarm setting: 2.5 High watt alarm setting: 6   Suction: on  Lavare cycle: on  Blood Products: 6/28> 5 PRBC's, 6 FFP 7/1> 1 PRBC 7/9> 1 PRBC 7/13>3 PRBC 7/14>3 PRBC 7/15> 1 PRBC 7/16>3PRBC 7/17> 3 PRBC 7/20> 1 PRBC 8/6> 1 PRBC  Gtts: Milrinone restarted 7/8, stopped 7/16 Levo stopped 04/26/17 restarted 04/20/2017 - stopped 05/07/17 Amiodarone stopped 04/29/17, restarted 7/17 - transitioned to PO 05/13/17; VT on 05/15/17 - IV amio re-started - 30 mg/hr stopped 05/16/17 Heparin stopped 04/29/17 Lidocaine 05/07/17 - stopped 7/19  TPN - started 04/24/17 for nutritional support, stopped 04/30/17  Arrhythmia: 04/24/17 - Afib with RVR - started amiodarone 05/02/17 - 2 sustained episodes of VT - cardioverted x 1; second broke with overdrive pacing 7/17- wide complex tachycardia- Amio bolus x3 and gtt at 60 mg/hr 05/07/17 - sustained VT - converted with overdrive pacing; Lidocaine started in addition to amiodarone 05/08/17 - Lidocaine stopped due to confusion (lido level 12.4) 05/14/17 - sustained VT - converted 3 separate occassions with overdrive pacing 05/19/17 - sustained VT-converted with overdrive pacing  05/26/17- Afib rate in the 50's   Respiratory: 04/23/17 - re-intubated due to respiratory failure secondary to suspected aspiration pneumonia 04/25/17-extubated 05/02/2017- intubated for EGD 04/28/2017-  extubated 05/28/17- Intubated due to resp failure  Drive Line: Daily Dressing Kits with Aquacell AG silver strips per protocol.   Labs:  LDH trend:160 (pre VAD)>227>215......200>183>161>164>184>195>190>188>187>205>212>185>167>167>199>165>151>159>163>181>219>189  INR trend: 1.31>1.45>1.44>2.09>2.28>3.58>3.94>3.22>2.17>1.74>1.7>2.24>2.68>2.33>2.15>2.25>2.11>2.13>2.45>2.25>2.64>2.83>2.92>2.48>2.05>1.93>2.09  Anticoagulation Plan: -INR Goal: 2-2.5  -ASA Dose: 325 mg- on hold  Adverse Events on VAD: - [facility] evacuation of mediastinal hematoma - 04/23/17 Ileus with vomiting; re-intubation for acute respiratory failure; probable aspiration pneumonia -7/13-GI bleed - 7/17 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 He had epinephrine, APC, and 2 clips.  - 7/19 - Lidocaine gtt stopped due to confusion (lido toxicity) -8/7- Neurology consult- lethargy, mouth tremors, not vocal, not following commands> Head CT and EEG, blood and urine cultures sent, Ammonia-20 -8/8- AMS, CCM consulted- intubated, poss asp pnuemonia   Plan/Recommendations:  1. Continue daily drive line dressing changes daily with patient's wife.  2. Please call VAD pager with patient and equipment concerns.Marland Kitchen. 3. [name] updated by Shirlee LatchMcLean upon [facility]  Marcellus ScottLesley Kess Mcilwain RN, VAD Coordinator 24/7 pager [phone]

## 2017-05-28 NOTE — Progress Notes (Signed)
Called by charge [name] on [facility] to assist with patient with resp difficulty and decreased LOC.  On arrival James SaberAndy Tillery PA present - patient with increased WOB - hot to touch - diaphoretic - not responding to name - eyes open only- RR 40- O2 sats 92% on nasal cannula  - bil BS with coarse BS - placed on NRB mask - left arm manual BP 96/doppler HR 66  - PCXR done - CBG 150 - NGT with TF running - NGT out rather far - TF turned off - awaiting offical PCXR report - ? Placement of tube - was in proximal stomach last pm on abd xray - Dr. Shirlee LatchMcLean to bedside - for transfer to ICU - VAD co-ordinator James ScottLesley [name] present - transfer to 2H09.  Handoff to [employer] Timken CompanySondra [name] - report given by James CombsAmanda [name].  Dr. Isaiah SergeMannam at bedside ICU.

## 2017-05-29 ENCOUNTER — Inpatient Hospital Stay (HOSPITAL_COMMUNITY): Payer: Medicare PPO

## 2017-05-29 DIAGNOSIS — Z95811 Presence of heart assist device: Secondary | ICD-10-CM

## 2017-05-29 DIAGNOSIS — Z515 Encounter for palliative care: Secondary | ICD-10-CM

## 2017-05-29 DIAGNOSIS — I5023 Acute on chronic systolic (congestive) heart failure: Secondary | ICD-10-CM

## 2017-05-29 DIAGNOSIS — J690 Pneumonitis due to inhalation of food and vomit: Secondary | ICD-10-CM

## 2017-05-29 LAB — MAGNESIUM: MAGNESIUM: 2 mg/dL (ref 1.7–2.4)

## 2017-05-29 LAB — COOXEMETRY PANEL
CARBOXYHEMOGLOBIN: 1.6 % — AB (ref 0.5–1.5)
Methemoglobin: 1 % (ref 0.0–1.5)
O2 SAT: 84.9 %
TOTAL HEMOGLOBIN: 8.8 g/dL — AB (ref 12.0–16.0)

## 2017-05-29 LAB — GLUCOSE, CAPILLARY
GLUCOSE-CAPILLARY: 84 mg/dL (ref 65–99)
Glucose-Capillary: 100 mg/dL — ABNORMAL HIGH (ref 65–99)
Glucose-Capillary: 109 mg/dL — ABNORMAL HIGH (ref 65–99)
Glucose-Capillary: 81 mg/dL (ref 65–99)
Glucose-Capillary: 91 mg/dL (ref 65–99)
Glucose-Capillary: 102 mg/dL — ABNORMAL HIGH (ref 65–99)

## 2017-05-29 LAB — BASIC METABOLIC PANEL
Anion gap: 10 (ref 5–15)
BUN: 51 mg/dL — AB (ref 6–20)
CO2: 19 mmol/L — ABNORMAL LOW (ref 22–32)
CREATININE: 2.52 mg/dL — AB (ref 0.61–1.24)
Calcium: 7.6 mg/dL — ABNORMAL LOW (ref 8.9–10.3)
Chloride: 111 mmol/L (ref 101–111)
GFR calc Af Amer: 32 mL/min — ABNORMAL LOW (ref >60)
GFR, EST NON AFRICAN AMERICAN: 27 mL/min — AB (ref >60)
Glucose, Bld: 104 mg/dL — ABNORMAL HIGH (ref 65–99)
Potassium: 4.3 mmol/L (ref 3.5–5.1)
SODIUM: 140 mmol/L (ref 135–145)

## 2017-05-29 LAB — CBC
HCT: 26.2 % — ABNORMAL LOW (ref 39.0–52.0)
Hemoglobin: 8.5 g/dL — ABNORMAL LOW (ref 13.0–17.0)
MCH: 28.5 pg (ref 26.0–34.0)
MCHC: 32.4 g/dL (ref 30.0–36.0)
MCV: 87.9 fL (ref 78.0–100.0)
PLATELETS: 243 10*3/uL (ref 150–400)
RBC: 2.98 MIL/uL — ABNORMAL LOW (ref 4.22–5.81)
RDW: 18 % — AB (ref 11.5–15.5)
WBC: 17 10*3/uL — ABNORMAL HIGH (ref 4.0–10.5)

## 2017-05-29 LAB — BRAIN NATRIURETIC PEPTIDE: B NATRIURETIC PEPTIDE 5: 632.8 pg/mL — AB (ref 0.0–100.0)

## 2017-05-29 LAB — PROCALCITONIN: Procalcitonin: 1.49 ng/mL

## 2017-05-29 LAB — PROTIME-INR
INR: 2.9
Prothrombin Time: 30.9 seconds — ABNORMAL HIGH (ref 11.4–15.2)

## 2017-05-29 LAB — LACTATE DEHYDROGENASE: LDH: 173 U/L (ref 98–192)

## 2017-05-29 LAB — LACTIC ACID, PLASMA: Lactic Acid, Venous: 1.1 mmol/L (ref 0.5–1.9)

## 2017-05-29 MED ORDER — COLLAGENASE 250 UNIT/GM EX OINT
TOPICAL_OINTMENT | Freq: Every day | CUTANEOUS | Status: DC
  Administered 2017-05-29: 10:00:00 via TOPICAL
  Administered 2017-05-30: 1 via TOPICAL
  Administered 2017-05-31 – 2017-06-20 (×20): via TOPICAL
  Administered 2017-06-21: 1 via TOPICAL
  Administered 2017-06-22 – 2017-06-24 (×3): via TOPICAL
  Filled 2017-05-29 (×6): qty 30

## 2017-05-29 MED ORDER — OXYCODONE HCL 5 MG/5ML PO SOLN
5.0000 mg | ORAL | Status: DC | PRN
  Administered 2017-05-29: 10 mg via ORAL
  Administered 2017-05-29 (×2): 5 mg via ORAL
  Filled 2017-05-29: qty 5
  Filled 2017-05-29: qty 10
  Filled 2017-05-29: qty 5

## 2017-05-29 MED ORDER — LEVOTHYROXINE SODIUM 50 MCG PO TABS
50.0000 ug | ORAL_TABLET | Freq: Every day | ORAL | Status: DC
  Administered 2017-05-30 – 2017-06-25 (×27): 50 ug via NASOGASTRIC
  Filled 2017-05-29 (×27): qty 1

## 2017-05-29 MED ORDER — DEXTROSE 50 % IV SOLN
25.0000 mL | Freq: Once | INTRAVENOUS | Status: AC
  Administered 2017-05-29: 25 mL via INTRAVENOUS

## 2017-05-29 NOTE — Progress Notes (Signed)
OT Cancellation Note  Patient Details Name: Edd FabianHarry D Forde MRN: [id number] DOB: [birth year]   Cancelled Treatment:    Reason Eval/Treat Not Completed: Medical issues which prohibited therapy.  Will check back.  Mariem Skolnick Dewartonarpe, OTR/L [phone]   [name], Amanii Snethen M 05/29/2017, 11:51 AM

## 2017-05-29 NOTE — Progress Notes (Signed)
Patient ID: DAVON ABDELAZIZ, male   DOB: 1961/03/28, 56 y.o.   MRN: [id number]   Advanced Heart Failure VAD Team Note  Subjective:    Events: --HVAD placed 6/28.  Returned to the OR that evening with high chest tube output, evacuation of mediastinal hematoma.  --Extubated 6/29. Milrinone stopped on 7/4. --7/4, patient developed ileus with respiratory compromise. NGT placed with 3 L suctioned out.  CXR with suspicion for aspiration PNA.  Patient had to be intubated.  He went into atrial fibrillation with RVR.  He became hypotensive and was started on norepinephrine and phenylephrine.  Amiodarone gtt begun.    --Extubated again on 7/6.  -- 7/13 Had 2 sustained episodes of VT. Had to be cardioverted 1. Second episode broke with overdrive pacing with Dr. Caryl Comes. VAD speed turned down to 2700.  --Melena due to acuteGI bleed --> 2 units PRBCs 7/14, 2 units 7/15. 3 U PRBCs 7/16,  05/04/2017 3 UPRBCs.  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  He had epinephrine, APC, and 2 clips. Intubated prior to procedure and placed on norepinephrine. Speed dropped to 2660.  --7/18 Back in VT with overdrive pacing. Lidocaine was started in addition to amiodarone. Extubated. --7/19 lidocaine stopped with confusion. Neuro consulted.  EEG normal.  CT of head no acute findings. 7/20 confusion had resolved.  --1 unit PRBCs 7/20.  --7/22, developed sustained VT with rate around 130 shortly after getting IV Lasix.  We were able to pace him out and back to NSR.  He was put back on amiodarone gtt.  --More VT on 7/25, paced out.  Back on IV amiodarone gtt.  NSR.  -- 7/31 Ramp ECHO decrease speed 2600 rpm.  --8/1 VT- paced out --8/2 VT - started on sotalol --8/6 Hgb 7.5, 1 unit PRBCs - 8/7 Hgb 9.5, confused.  Head CT with right superior cerebellar artery lacunar infarcts, not acute. -8/8 Possible aspiration. Reintubated.  Yesterday reintubated. Possible aspiration. Febrile  over night. T max 102.  Follows commands.     HVAD INTERROGATION:  HVAD:  Flow 5.1 liters/min, speed 2600,  power 4 W,  Peak 7.3 Trough 3.7   Suction On. Lavare On. No alarms.    Objective:    Vital Signs:   Temp:  [99.7 F (37.6 C)-102 F (38.9 C)] 99.9 F (37.7 C) (08/09 0323) Pulse Rate:  [27-117] 68 (08/09 0600) Resp:  [14-41] 24 (08/09 0600) BP: (48-138)/(18-100) 97/71 (08/09 0613) SpO2:  [52 %-100 %] 100 % (08/09 0600) FiO2 (%):  [40 %-100 %] 40 % (08/09 0600) Weight:  [221 lb 5.5 oz (100.4 kg)] 221 lb 5.5 oz (100.4 kg) (08/09 0600) Last BM Date: 05/28/17 Mean arterial Pressure 80-90s Intake/Output:   Intake/Output Summary (Last 24 hours) at 05/29/17 0709 Last data filed at 05/29/17 0600  Gross per 24 hour  Intake           992.17 ml  Output              825 ml  Net           167.17 ml     Physical Exam  CVP 7-8 Physical Exam: GENERAL: Intubated.  HEENT: ETT [place]  NECK: Supple, JVP hard to assess with ETT.  2+ bilaterally, no bruits.  No lymphadenopathy or thyromegaly appreciated.   CARDIAC:  Mechanical heart sounds with LVAD hum present.  LUNGS:  Clear to auscultation bilaterally.  ABDOMEN:  Soft, round, nontender, sluggish bowel sounds  LVAD exit site:  Dressing dry and intact.  No erythema or drainage.  Stabilization device present and accurately applied.  Driveline dressing is being changed daily per sterile technique. EXTREMITIES:  Warm and dry, no cyanosis, clubbing, rash. R and LLE 3+ edema.  NEUROLOGIC:  Intubated. Follows commands.  SKIN: Sacrum - 100% necrotic, induration 1cm circufrentially, + odor       Telemetry   Personally reviewed. 60-70s No VT  Labs   Basic Metabolic Panel:  Recent Labs Lab 05/25/17 0347 05/26/17 0413 05/27/17 0419 05/28/17 0503 05/29/17 0216  NA 135 137 137 137 140  K 3.6 4.2 4.2 4.4 4.3  CL 106 108 106 108 111  CO2 _0 20* 19*  GLUCOSE 135* 150* 145* 111* 104*  BUN 44* 45* 47* 47* 51*  CREATININE  1.73* 1.98* 2.04* 2.16* 2.52*  CALCIUM 7.8* 7.7* 8.0* 7.9* 7.6*  MG 1.7 2.0 1.9 2.1 2.0    Liver Function Tests: No results for input(s): AST, ALT, ALKPHOS, BILITOT, PROT, ALBUMIN in the last 168 hours. No results for input(s): LIPASE, AMYLASE in the last 168 hours.  Recent Labs Lab 05/27/17 0800  AMMONIA 20    CBC:  Recent Labs Lab 05/25/17 0347 05/26/17 0413 05/27/17 0419 05/28/17 0503 05/29/17 0216  WBC 12.8* 17.8* 15.2* 16.5* 17.0*  NEUTROABS  --  15.8*  --   --   --   HGB 8.1* 7.5* 9.5* 9.6* 8.5*  HCT 25.2* 23.4* 29.2* 30.2* 26.2*  MCV 89.7 90.3 90.4 88.8 87.9  PLT 298 293 289 300 243    INR:  Recent Labs Lab 05/25/17 0347 05/26/17 0413 05/27/17 0419 05/28/17 0503 05/29/17 0216  INR 1.90 2.05 1.93 2.09 2.90    Other results:     Imaging   Ct Head Wo Contrast  Result Date: 05/27/2017 CLINICAL DATA:  56 year old male with episode of sudden onset decreased level of consciousness today. Improved mental status now. EXAM: CT HEAD WITHOUT CONTRAST TECHNIQUE: Contiguous axial images were obtained from the base of the skull through the vertex without intravenous contrast. COMPARISON:  05/08/2017. FINDINGS: Brain: Mild scaphocephaly. No midline shift, ventriculomegaly,evidence of mass lesion or evidence of cortically based acute infarction. No hyperdense intracranial hemorrhage, but suggestion of stable bilateral hemisphere extra-axial collections measuring 3-4 mm bilaterally. See coronal images 32 and 33. These appear unchanged except for possible increased density. No significant mass effect. Gray-white matter differentiation appears stable in both hemispheres, with patchy cerebral white matter hypodensity, and probable small chronic lacunar infarct in the right thalamus. Stable small chronic lacunar infarct in the left cerebellar hemisphere (coronal image 58), but increased hypodensity in the right superior cerebellum suggesting interval lacunar infarcts in the right  SCA territory. These have a chronic appearance today. Vascular: Calcified atherosclerosis at the skull base. No suspicious intracranial vascular hyperdensity. Skull: Stable.  No acute osseous abnormality identified. Sinuses/Orbits: Clear aside from continued mild mucosal thickening in the left maxillary sinus, now with some bubbly opacity. Other: No acute orbit or scalp soft tissue findings. IMPRESSION: 1. Small bilateral subdural hygromas or hematomas, but not significantly changed since July and clinical significance unclear. 2. Right superior cerebellar artery lacunar infarcts have become apparent since 05/08/2017, but have a chronic appearance today. 3. 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 4. In light of #1 and #2, follow-up brain MRI may be valuable. Electronically Signed   By: Genevie Ann M.D.   On: 05/27/2017 11:25   Dg  Chest Port 1 View  Result Date: 05/28/2017 CLINICAL DATA:  Hypoxia EXAM: PORTABLE CHEST 1 VIEW COMPARISON:  Study obtained earlier in the day FINDINGS: Endotracheal tube tip is 3.4 cm above the carina. Orogastric tube tip and side port are in the stomach. Central catheter tip is in the superior vena cava. Pacemaker tip is in the right ventricle region. There is a left ventricular assist device in place. No pneumothorax. There is slight left base atelectasis. Lungs elsewhere clear. Heart is borderline enlarged, stable. The pulmonary vascularity is normal. There is aortic atherosclerosis. No adenopathy. There is postoperative change in the right shoulder. IMPRESSION: Tube and catheter positions as described without pneumothorax. Slight left base atelectasis. No edema or consolidation. Stable cardiac silhouette. There is aortic atherosclerosis. Aortic Atherosclerosis (ICD10-I70.0). Electronically Signed   By: Lowella Grip III M.D.   On: 05/28/2017 10:17   Dg Chest Port 1 View  Result Date: 05/28/2017 CLINICAL DATA:  Fever EXAM:  PORTABLE CHEST 1 VIEW COMPARISON:  05/27/2017 FINDINGS: Hypoventilation with decreased lung volume. Progression of left lower lobe airspace disease. No heart failure or effusion. NG tube enters the stomach. Right arm PICC tip in the SVC unchanged IMPRESSION: Hypoventilation. Progression of left lower lobe atelectasis/infiltrate. Electronically Signed   By: Franchot Gallo M.D.   On: 05/28/2017 09:16   Dg Chest Port 1 View  Result Date: 05/27/2017 CLINICAL DATA:  Dyspnea. EXAM: PORTABLE CHEST 1 VIEW COMPARISON:  Radiographs May 18, 2017. FINDINGS: Stable cardiomediastinal silhouette. Surgical staples are noted. Stable position of left ventricular assistance device. Single lead left-sided pacemaker is unchanged in position. Elevated left hemidiaphragm is again noted. Right-sided PICC line is unchanged in position. No pneumothorax or pleural effusion is noted. No acute pulmonary disease is noted. Bony thorax is unremarkable. IMPRESSION: Stable support apparatus as described above. No acute abnormality noted currently. Electronically Signed   By: Marijo Conception, M.D.   On: 05/27/2017 09:29   Dg Abd Portable 1v  Result Date: 05/28/2017 CLINICAL DATA:  Feeding tube placement EXAM: PORTABLE ABDOMEN - 1 VIEW COMPARISON:  05/28/2017 1200 hour FINDINGS: The feeding tube follows the course of the stomach, duodenal bulb and its tip projects over the region of the proximal jejunum. A hiatal hernia is noted. Mildly distended small and large bowel loops are unchanged. LVAD device projects over the cardiac apex. AICD device projects over the right ventricle. IMPRESSION: Tip of the feeding tube projects over the proximal jejunum. Electronically Signed   By: Marybelle Killings M.D.   On: 05/28/2017 17:05   Dg Abd Portable 1v  Result Date: 05/28/2017 CLINICAL DATA:  Encounter for feeding tube placement. EXAM: PORTABLE ABDOMEN - 1 VIEW COMPARISON:  05/28/2017 FINDINGS: Feeding tube has been placed, its course looped within a hiatal  hernia and tip within the region of the distal stomach. Central line, pacer lines and left ventricular assist device appears stable. Bowel gas pattern is nonobstructive. IMPRESSION: Feeding tube tip to the distal stomach, coarse looped within a hiatal hernia. Electronically Signed   By: Nolon Nations M.D.   On: 05/28/2017 12:44   Dg Abd Portable 1v  Result Date: 05/28/2017 CLINICAL DATA:  NG tube placement. EXAM: PORTABLE ABDOMEN - 1 VIEW COMPARISON:  Abdominal x-ray dated yesterday. FINDINGS: Enteric tube seen with the tip in the gastric fundus, similar to prior study. LVAD and pacer lead again noted. Air-filled, distended large and small bowel is again seen. Multiple surgical clips noted throughout the abdomen. No radio-opaque calculi or other significant radiographic  abnormality are seen. IMPRESSION: 1. Nasogastric tube with tip projecting over the gastric fundus. 2. Unchanged diffuse gaseous distention of bowel, suggestive of ileus. Electronically Signed   By: Titus Dubin M.D.   On: 05/28/2017 10:15   Dg Abd Portable 1v  Result Date: 05/27/2017 CLINICAL DATA:  Feeding tube placement EXAM: PORTABLE ABDOMEN - 1 VIEW COMPARISON:  05/14/2017 FINDINGS: There is a nasogastric tube with tip and side-port over the proximal stomach. A central line tip is seen over the right atrium. LVAD and pacer lead is present. Diffuse gaseous distention of bowel, both colon and small bowel. Lung bases are clear. IMPRESSION: 1. Nasogastric tube with tip over the proximal stomach. 2. Diffuse gaseous distention of bowel, correlate for ileus symptoms. Electronically Signed   By: Monte Fantasia M.D.   On: 05/27/2017 14:53     Medications:     Scheduled Medications: . amiodarone  200 mg Oral BID  . amLODipine  10 mg Oral Daily  . chlorhexidine gluconate (MEDLINE KIT)  15 mL Mouth Rinse BID  . Chlorhexidine Gluconate Cloth  6 each Topical Q0600  . febuxostat  120 mg Oral Daily  . hydrALAZINE  10 mg Intravenous Q4H    . insulin aspart  0-15 Units Subcutaneous Q4H  . isosorbide mononitrate  30 mg Oral Daily  . magnesium oxide  400 mg Oral Daily  . mouth rinse  15 mL Mouth Rinse 10 times per day  . mexiletine  300 mg Oral Q12H  . mirtazapine  15 mg Oral QHS  . pantoprazole sodium  40 mg Per Tube BID  . potassium chloride  40 mEq Per Tube Daily  . sodium chloride flush  10-40 mL Intracatheter Q12H  . sotalol  80 mg Oral Daily  . spironolactone  25 mg Oral Daily  . torsemide  40 mg Oral Daily    Infusions: . sodium chloride Stopped (05/02/17 0630)  . sodium chloride 10 mL/hr at 05/29/17 0600  . sodium chloride    . feeding supplement (GLUCERNA 1.2 CAL)    . meropenem (MERREM) IV Stopped (05/28/17 2252)  . vancomycin      PRN Medications: acetaminophen (TYLENOL) oral liquid 160 mg/5 mL, albuterol, docusate, hydrALAZINE, neomycin-bacitracin-polymyxin, ondansetron (ZOFRAN) IV, oxyCODONE, phenol, sodium chloride flush, traMADol   Patient Profile   56 yo with CAD s/p CABG, ischemic cardiomyopathy/chronic systolic CHF, tophaceous gout, and CKD stage 3 was admitted for diuresis and consideration for LVAD placement. S/p HVAD on 6/28  Assessment/Plan:    1. Acute/chronic systolic CHF s/p HVAD: Ischemic cardiomyopathy.  St Jude ICD.  Echo (6/18) with EF 15%, mildly dilated RV with moderately decreased systolic function.  s/p HVAD placement 6/28 and had to return to OR to evacuate mediastinal hematoma.  He had been weaned off pressors/milrinone, but developed ileus w/ likely aspiration event 7/4, re-intubated, developed afib/RVR requiring norepinephrine. Extubated 7/6.  VAD speed turned down to 2660 with VT. VAD parameters currently look good.  Milrinone stopped 7/17 early am with recurrent VT.  Now off norepinephrine.   CO-OX 85%. .   -CVP 7. Hold off on HTN meds. Maps have not been high.  - Off ASA with GI Bleed  - Goal 2-2.5. Todays INR 2.9  - Has been off dig/arb with elevated creatinine.     2. AKI  on CKD stage 3: Suspect a component of peri-op ATN, worsened with ileus, vomiting, intubation/sedation.  - Creatinine continues to trend up 2.1>2.5  3. Symptomatic anemia due to acute UGI bleeding:  Received 3 units PRBCs 7/16 and 3 units PRBCs on 04/30/2017. 1 unit PRBCs 7/20. 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  Requiring 2 clips + epi + APC. Does not appear to be bleeding actively now.  - Got Feraheme 7/28.  - Continue Protonix 40 po bid. - Off aspirin for now. Dosing per pharm. INR 2.9  - Transfuse hgb < 8  -On 8/6 received 1UPRBCs.  - Hgb trending down 8.5. No obvious source of bleeding.  4. Ventricular tachycardia: Status post ventricular tachycardia 2 on 7/13. Possible suction event. VAD speed turned down to 2700.  Recurrent VT 7/17. EP called to bedside. Overdrive pacing successful for about 10 minutes but VT recurred.  Milrinone turned off, remained in VT overnight but hemodynamically stable.  Speed decreased to 2660.  On 7/18, we were able to pace him out of VT.  Lidocaine added then stopped due to confusion/mental status changes.  He had VT 7/22 about 30 minutes after getting IV Lasix, had to be paced out again => may have been due to rapid fluid shift.  Pt paced out of VT 3 separate occasions 7/25. Got amio bolus x 2 and started back on IV infusion.  Transitioned to amiodarone 400 mg tid.  On 8/1 had recurrent VT and paced out again.  Recurrent VT 8/2 per EP sotalol started along with amiodarone, sotalol decreased to once daily with bradycardia.   - No VT over night.  - Continue mexiletine to 300 mg BID. - Continue sotalol 80 mg daily. - Continue amio 200 mg BID     5. CAD: s/p CABG 2012. No S/S ischemia.  Off aspirin due to GI bleeding. No change.   6. Gout: Severe tophaceous gout. Left shoulder pain ?gout.   - Completed 3 days of prednisone.   - Continue uloric. NO change.  7. Atrial fibrillation: Developed atrial fibrillation with RVR in  setting of aspiration PNA on 7/4.    Hard to know with interference from [place]. Appears NSR.  8. Malnutrition: Now off TPN. Per Speech- FEEs performed. Prealbumin very low.  Dr Cyndia Bent reviewed KUB. OK to use cortrack Back on tube feeds.  9. Aspiration PNA with acute hypoxemic respiratory failure on 7/4: He was extubated on 7/6. Tracheal aspirate with Klebsiella and citrobacter. Both sensitive to Zosyn. Completed abx 7/13.  WBC 17. Possible aspiration 8/8.   On meropenum.  10. Ileus: Resolved. 11. Deconditioning:  Plan for CIR when stable. OT/PT appreciated. CIR on hold.   12. Acute Respiratory Failure: Intubated 7/17 in setting of complex GI intervention. Extubated 7/18.  Re intubated 8/8 .  13. Delirium: Resolved. No further. Appreciate neuro input Possibly related to lidocaine.  Lidocaine stopped. CT of head negative. EEG ok. No further workup per neuro.  Neuro  reconsulted 8/7 with AMS. Still febrile overnight. ABX broadened. -  CT head 05/27/17 with lacunar infarcts but do not appear acute.   -Currently intubated.  15. Insomnia: On remeron for sleep and depression. 16. Post-op depression: Remeron started 7/28.  17. UTI - on vancomycin and meropenum. Urine culture pending. Recollect urine cultrue today.  18. Unstageable Pressure Ulcer- Sacrum. Consult WOC Needs to start santyl daily to enzymatically debride. . Reposition R to L . Discussed with WOC today.     I reviewed the HVAD parameters from today, and compared the results to the patient's prior recorded data.  No programming changes were made.  The HVAD is functioning within specified parameters.  Darrick Grinder, NP 05/29/2017, 7:09 AM  VAD Team --- VAD ISSUES ONLY--- Pager [phone] (7am - 7am) Advanced Heart Failure Team  Pager [phone] (M-F; 7a - 4p)  Please contact [facility] for night-coverage after hours (4p -7a ) and weekends on [email or web address]  Agree with above.  He is critically ill. Remains on ventilator. He is lethargic but  will arouse and follow commands. Remains on broad spectrum abx. CXR clearing (reviewed personally). KUB reviewed and Dr. Cyndia Bent has looked at it too - Cor-Trak seems adequately replaced, Will resume TFs. Maintaining NSR on current regimen. QT slightly prolonged but will continue sotalol/mexilitene/amio per EP recommendations. VT currently quiescent.  Now has large sacral ulcer which is likely down to the bone. WOC has been consulted  On exam Intubated lethargic but arousable JVP up COR + LVAD hum Lungs coarse  Ab soft +distended. VAD site ok  Ext: 2+ edema diffuse gouty changes   He remains critically ill with MSOF and severe malnutrition. On broad spectrum abx for recent aspiration PNA and UTI. Now with large pressure ulcer.   Long talk with wife and son about my concern for his ability to survive this hospitalization. Will continue aggressive care for now.   VAD parameters stable.   CRITICAL CARE Performed by: Glori Bickers  Total critical care time: 40 minutes  Critical care time was exclusive of separately billable procedures and treating other patients.  Critical care was necessary to treat or prevent imminent or life-threatening deterioration.  Critical care was time spent personally by me (independent of midlevel providers or residents) on the following activities: development of treatment plan with patient and/or surrogate as well as nursing, discussions with consultants, evaluation of patient's response to treatment, examination of patient, obtaining history from patient or surrogate, ordering and performing treatments and interventions, ordering and review of laboratory studies, ordering and review of radiographic studies, pulse oximetry and re-evaluation of patient's condition.  Glori Bickers, MD  11:37 AM

## 2017-05-29 NOTE — Progress Notes (Signed)
CSW met with wife in the waiting room. Wife shared plans to stay through the weekend. CSW provided support and encouraged wife to get some rest as she is exhausted. Wife states she has some family close by and will consider going there tonight to sleep. CSW will continue to provide supportive intervention and address any other needs as they arise. Raquel Sarna, [place], [place]

## 2017-05-29 NOTE — Progress Notes (Addendum)
Daily Progress Note   Patient Name: James [name]       Date: 05/29/2017 DOB: 02/09/1961  Age: 56 y.o. MRN#: [id number] Attending Physician: Gaye Pollack, MD Primary Care Physician: Jacqualine Code, DO Admit Date: 04/10/2017  Reason for Consultation/Follow-up: LVAD evaluation  Subjective: Mr. Conry is intubated and unresponsive.   Length of Stay: 48  Current Medications: Scheduled Meds:  . amiodarone  200 mg Oral BID  . chlorhexidine gluconate (MEDLINE KIT)  15 mL Mouth Rinse BID  . Chlorhexidine Gluconate Cloth  6 each Topical Q0600  . collagenase   Topical Daily  . febuxostat  120 mg Oral Daily  . insulin aspart  0-15 Units Subcutaneous Q4H  . [START ON 05/30/2017] levothyroxine  50 mcg Per NG tube QAC breakfast  . magnesium oxide  400 mg Oral Daily  . mouth rinse  15 mL Mouth Rinse 10 times per day  . mexiletine  300 mg Oral Q12H  . mirtazapine  15 mg Oral QHS  . pantoprazole sodium  40 mg Per Tube BID  . sodium chloride flush  10-40 mL Intracatheter Q12H  . sotalol  80 mg Oral Daily    Continuous Infusions: . sodium chloride Stopped (05/02/17 0630)  . sodium chloride 10 mL/hr at 05/29/17 1200  . sodium chloride    . feeding supplement (GLUCERNA 1.2 CAL) 1,000 mL (05/29/17 1200)  . meropenem (MERREM) IV Stopped (05/29/17 1007)  . vancomycin Stopped (05/29/17 1130)    PRN Meds: acetaminophen (TYLENOL) oral liquid 160 mg/5 mL, albuterol, docusate, hydrALAZINE, neomycin-bacitracin-polymyxin, ondansetron (ZOFRAN) IV, oxyCODONE, phenol, sodium chloride flush, traMADol  Physical Exam  Constitutional: He appears cachectic. He has a sickly appearance. He is intubated.  Cardiovascular: An irregularly irregular rhythm present. Bradycardia present.   Pulmonary/Chest: No  tachypnea. He is intubated. No respiratory distress. He has rhonchi.  Abdominal: Normal appearance.  Clean dressing over driveline left abd  Neurological: He is unresponsive.  Nursing note and vitals reviewed.           Vital Signs: BP (!) 77/65 (BP Location: Left Arm)   Pulse 63   Temp 100.2 F (37.9 C) (Oral)   Resp 19   Ht 6' (1.829 m)   Wt 100.4 kg (221 lb 5.5 oz)   SpO2 100%   BMI 30.02 kg/m  SpO2: SpO2: 100 % O2 Device: O2 Device: Ventilator O2 Flow Rate: O2 Flow Rate (L/min): 15 L/min  Intake/output summary:  Intake/Output Summary (Last 24 hours) at 05/29/17 1236 Last data filed at 05/29/17 1200  Gross per 24 hour  Intake          1613.92 ml  Output             1425 ml  Net           188.92 ml   LBM: Last BM Date: 05/28/17 Baseline Weight: Weight: 95.3 kg (210 lb) Most recent weight: Weight: 100.4 kg (221 lb 5.5 oz)       Palliative Assessment/Data:    Flowsheet Rows     Most Recent Value  Intake Tab  Referral Department  Cardiology  Unit at Time of Referral  Cardiac/Telemetry Unit  Palliative Care Primary Diagnosis  Cardiac  Date Notified  04/14/17  Palliative Care Type  New Palliative care  Reason for referral  Other (Comment) [VAD Eval]  Date of Admission  04/03/2017  # of days IP prior to Palliative referral  3  Clinical Assessment  Psychosocial & Spiritual Assessment  Palliative Care Outcomes      Patient Active Problem List   Diagnosis Date Noted  . Acute encephalopathy   . Pressure injury of skin 05/17/2017  . Echolalia   . Other tics of organic origin   . Upper GI bleed   . VT (ventricular tachycardia) ([place])   . Acute blood loss anemia   . Blood in stool   . Melena   . Elevated INR (international normalized ratio) due to prior anticoagulant medication ingestion   . Heart failure ([place])   . Ileus ([place])   . Presence of left ventricular assist device (LVAD) ([place])   . History of gout   . Coronary artery disease involving coronary bypass  graft of native heart without angina pectoris   . Chronic kidney disease   . Diabetes mellitus type 2 in nonobese (HCC)   . Atrial fibrillation with rapid ventricular response ([place])   . Tachypnea   . Acute respiratory failure with hypoxia ([place])   . LVAD (left ventricular assist device) present ([place]) 04/12/2017  . Palliative care by specialist 04/16/2017  . DNR (do not resuscitate) discussion 04/16/2017  . Anemia, chronic disease   . Colon cancer screening   . Acute on chronic systolic CHF (congestive heart failure) ([place]) 03/22/2017  . Acute on chronic systolic heart failure, NYHA class 3 ([place]) 04/19/2017  . Acute kidney injury ([place]) 03/02/2017  . Polyarticular gout   . Renal insufficiency   . AKI (acute kidney injury) ([place]) 03/01/2017  . DM (diabetes mellitus), type 2 with renal complications ([place]) 13/05/6577  . Hyponatremia 03/01/2017  . Cardiomyopathy ([place]) 10/06/2015  . CHF (congestive heart failure) ([place]) 10/06/2015  . AICD (automatic cardioverter/defibrillator) present 09/21/2015  . CKD (chronic kidney disease) stage 3, GFR 30-59 ml/min 08/01/2015  . Depression 02/07/2012  . Chronic systolic congestive heart failure ([place]) 09/18/2011  . Congestion 08/22/2011  . Dyslipidemia 07/08/2011  . ED (erectile dysfunction) 06/25/2011  . DM (diabetes mellitus), type 2 ([place]) 12/17/2010  . Gout, unspecified 12/17/2010  . HYPONATREMIA 12/17/2010  . OVERWEIGHT/OBESITY 12/17/2010  . HYPERTENSION, BENIGN 12/17/2010  . ISCHEMIC CARDIOMYOPATHY 12/17/2010  . RENAL INSUFFICIENCY 12/17/2010  . EDEMA 12/17/2010  . CORONARY ARTERY BYPASS GRAFT, HX OF 12/17/2010    Palliative Care Assessment & Plan   HPI 56 y.o. male  admitted on 04/10/2017 with  significant PMH for gout, DMII, HTN, CKD, CAD S/P CABGx6 2473, chronic systolic heart failure and [employer] ICD. Evaluated April of the year for transplant but was not felt to be candidate due to mobility limitations with gout. Followed by Dr Aundra Dubin in  HF  clinic,  creatinine was trending up so he was set up for RHC and optimization for possible VAD.ECHO 04/08/2017 EF 15%, LVAD placed 6/28 but has had multiple complications including post op mediastinal hematoma, ileus, VT, GIB, AMS, and has been extubated 3 times and now currently just placed back on ventilator for possible aspiration pneumonia. He has struggled with severe debilitation/weakness and intake along with severe gout.   Assessment: I met today at Mr. Findling bedside with his wife, Corky Sox. She is more familiar with my colleague, Stanton Kidney, so I introduced myself today and offered support. She is understandably tearful. She is joined at his bedside with her father and Gibson's mother. His mother requests prayer. Emotional support provided.   Recommendations/Plan:  Per heart failure and PCCM  Code Status:  Limited code - no CPR with LVAD  Prognosis:   Unable to determine  Discharge Planning:  To Be Determined  Thank you for allowing the Palliative Medicine Team to assist in the care of this patient.   Total Time 13mn Prolonged Time Billed  no       Greater than 50%  of this time was spent counseling and coordinating care related to the above assessment and plan.  AVinie Sill NP Palliative Medicine Team Pager # [phone](M-F 8a-5p) Team Phone # [phone](Nights/Weekends)

## 2017-05-29 NOTE — Progress Notes (Signed)
CSW met with wife in the waiting room to provide supportive intervention. Wife spoke at length about challenges with current hospitalization and multiple set backs. Wife is a school principal and has been juggling work, home and being present here at the hospital. Patients wife plans to stay here for the next couple of days due to recent intubation/set back. She reported good support at work and despite the first day of school yesterday she feels confident that staff can handle to allow her time to focus on her husband. CSW provided listening and supportive intervention to assist with coping during this very difficult time. Wife appears appreciative of support and appropriately stressed over current decline.CSW will continue to follow for support and any other needs as they arise. Raquel Sarna, [place], [place]

## 2017-05-29 NOTE — Consult Note (Signed)
WOC Nurse wound consult note Reason for Consult: re-consulted for sacral wound, WOC nurse is already following for weekly assessments. Patient has been re-intubated and moved back into the ICU.  Wound type: Unstagable pressure injury: evolved with 100% soft yellow/brown eschar Pressure Injury POA:No Measurement: 3.5cm x 2.5cm x 0 Wound bed: see above Drainage (amount, consistency, odor) minimal currently  Periwound: intact  Dressing procedure/placement/frequency: MD added enzymatic debridement ointment today already.  Patient critically ill. He is intubated, his wife is at the bedside  Continue enzymatic debridement, WOC nurse will re-evaluate on Monday for addition of hydrotherapy based on patients status and wound status. SPORT mattress in place currently for pressure redistribution. Orders added for low air loss mattress should the patient move back out of the ICU.   RLE continues to drain from gout lesions, I have explained to the wife that topical care really involves managing drainage, I added alginate under the foam dressing last week to accommodate more drainage.   WOC Nurse team will follow along with you for weekly wound assessments.  Please notify me of any acute changes in the wounds or any new areas of concerns [facility] MSN, RN,CWOCN, CNS, CWON-AP [phone]

## 2017-05-29 NOTE — Procedures (Signed)
  Electroencephalogram report- LTM   Ordering Physician :  Dr Amada JupiterKirkpatrick  Data acquisition: 10-20 electrode placement.  Additional T1, T2, and EKG electrodes; 26 channel digital referential acquisition reformatted to 18 channel/7 channel coronal bipolar     Beginning time: 05/28/17 at 11 23 am  Ending time: 05/29/17 at 08 45 am   Day of study: day 1   This 22 hours of intensive EEG monitoring with simultaneous video monitoring was performed for this patient with spells  Confusion and shaking to  determine if these are seizures. Medications: as pe EMR   There was no pushbutton activations events during this recording.   Waking background activities marked by 8 cps posterior dominant symmetric alpha rhythm.  During wakefulness there is a superimposed muscle and movement artifact present.  There is appear to be a spontaneous movements present.    In addition there is a several episodes accompanied by tremulousness .  Independent right and left arm tremors noted.  This spells are not seizures.  EEG remained normal waking background activities prior during and after this events of interest.  Movements created in a rhythmic artifact related to tremors.  Patient had wake and sleep cycles.  During sleep background activities marked by low amplitude delta slowing distributed broadly.  There was no interhemispheric asymmetries, focal abnormalities or epileptiform discharges present.  No clinical subclinical seizures present.  Clinical interpretation: This 22 hours of intensive EEG monitoring with simultaneous video monitoring is essentially normal with a well defined posterior dominant alpha rhythm, wake and sleep cycles.  Spells of tremulousness are not seizures.

## 2017-05-29 NOTE — Progress Notes (Signed)
Patient ID: James [name], male   DOB: [birth year], 56 y.o.   MRN: [id number] HVAD Rounding Note  Subjective:    Intubated on vent after becoming obtunded and concern about being able to control his airway and possible aspiration. Etiology felt to be most likely infection, possible UTI, aspiration or large sacral decubitus.   LVAD INTERROGATION:  HVAD:  Flow 5.1 liters/min, speed 2600, power 4.3 W  Objective:    Vital Signs:   Temp:  [99.1 F (37.3 C)-102 F (38.9 C)] 100.2 F (37.9 C) (08/09 1226) Pulse Rate:  [28-117] 64 (08/09 1400) Resp:  [17-29] 17 (08/09 1400) BP: (48-138)/(18-97) 79/67 (08/09 1400) SpO2:  [52 %-100 %] 100 % (08/09 1400) FiO2 (%):  [40 %] 40 % (08/09 1210) Weight:  [100.4 kg (221 lb 5.5 oz)] 100.4 kg (221 lb 5.5 oz) (08/09 0600) Last BM Date: 05/29/17 Mean arterial Pressure 71  Intake/Output:   Intake/Output Summary (Last 24 hours) at 05/29/17 1500 Last data filed at 05/29/17 1400  Gross per 24 hour  Intake           913.92 ml  Output             1100 ml  Net          -186.08 ml     Physical Exam: General:  Intubated on vent. Opens eyes and follows commands with all extremities. [place] in place Cor: Distant heart sounds with LVAD hum present. Lungs: coarse Abdomen: soft, nontender, nondistended.  Good bowel sounds. Extremities: marked bilateral leg edema Neuro: lethargic but follows commands  Telemetry: sinus 64  Labs: Basic Metabolic Panel:  Recent Labs Lab 05/25/17 0347 05/26/17 0413 05/27/17 0419 05/28/17 0503 05/29/17 0216  NA 135 137 137 137 140  K 3.6 4.2 4.2 4.4 4.3  CL 106 108 106 108 111  CO2 _0 20* 19*  GLUCOSE 135* 150* 145* 111* 104*  BUN 44* 45* 47* 47* 51*  CREATININE 1.73* 1.98* 2.04* 2.16* 2.52*  CALCIUM 7.8* 7.7* 8.0* 7.9* 7.6*  MG 1.7 2.0 1.9 2.1 2.0    Liver Function Tests: No results for input(s): AST, ALT, ALKPHOS, BILITOT, PROT, ALBUMIN in the last 168 hours. No results for input(s): LIPASE,  AMYLASE in the last 168 hours.  Recent Labs Lab 05/27/17 0800  AMMONIA 20    CBC:  Recent Labs Lab 05/25/17 0347 05/26/17 0413 05/27/17 0419 05/28/17 0503 05/29/17 0216  WBC 12.8* 17.8* 15.2* 16.5* 17.0*  NEUTROABS  --  15.8*  --   --   --   HGB 8.1* 7.5* 9.5* 9.6* 8.5*  HCT 25.2* 23.4* 29.2* 30.2* 26.2*  MCV 89.7 90.3 90.4 88.8 87.9  PLT 298 293 289 300 243    INR:  Recent Labs Lab 05/25/17 0347 05/26/17 0413 05/27/17 0419 05/28/17 0503 05/29/17 0216  INR 1.90 2.05 1.93 2.09 2.90    Other results:  EKG:   Imaging: Dg Chest Port 1 View  Result Date: 05/29/2017 CLINICAL DATA:  Endotracheal tube, LVAD EXAM: PORTABLE CHEST 1 VIEW COMPARISON:  05/28/2017 FINDINGS: Endotracheal tube has tip 2.2 cm above the carina. Right-sided PICC line has been advanced slightly with tip just below the cavoatrial junction. LVAD unchanged. Left-sided pacemaker unchanged. Enteric tube courses into the stomach as tip is not visualized. Lungs are hypoinflated with mild prominence of the perihilar markings suggesting mild vascular congestion. No definite effusion or pneumothorax. Mild stable cardiomegaly. Remainder of the exam is unchanged. IMPRESSION: Mild stable cardiomegaly with minimal vascular  congestion. Multiple tubes and lines as described. Note that there has been mild interval advancement of the right-sided PICC line with tip just below the cavoatrial junction. Electronically Signed   By: Marin Olp M.D.   On: 05/29/2017 08:18   Dg Chest Port 1 View  Result Date: 05/28/2017 CLINICAL DATA:  Hypoxia EXAM: PORTABLE CHEST 1 VIEW COMPARISON:  Study obtained earlier in the day FINDINGS: Endotracheal tube tip is 3.4 cm above the carina. Orogastric tube tip and side port are in the stomach. Central catheter tip is in the superior vena cava. Pacemaker tip is in the right ventricle region. There is a left ventricular assist device in place. No pneumothorax. There is slight left base  atelectasis. Lungs elsewhere clear. Heart is borderline enlarged, stable. The pulmonary vascularity is normal. There is aortic atherosclerosis. No adenopathy. There is postoperative change in the right shoulder. IMPRESSION: Tube and catheter positions as described without pneumothorax. Slight left base atelectasis. No edema or consolidation. Stable cardiac silhouette. There is aortic atherosclerosis. Aortic Atherosclerosis (ICD10-I70.0). Electronically Signed   By: Lowella Grip III M.D.   On: 05/28/2017 10:17   Dg Chest Port 1 View  Result Date: 05/28/2017 CLINICAL DATA:  Fever EXAM: PORTABLE CHEST 1 VIEW COMPARISON:  05/27/2017 FINDINGS: Hypoventilation with decreased lung volume. Progression of left lower lobe airspace disease. No heart failure or effusion. NG tube enters the stomach. Right arm PICC tip in the SVC unchanged IMPRESSION: Hypoventilation. Progression of left lower lobe atelectasis/infiltrate. Electronically Signed   By: Franchot Gallo M.D.   On: 05/28/2017 09:16   Dg Abd Portable 1v  Result Date: 05/28/2017 CLINICAL DATA:  Feeding tube placement EXAM: PORTABLE ABDOMEN - 1 VIEW COMPARISON:  05/28/2017 1200 hour FINDINGS: The feeding tube follows the course of the stomach, duodenal bulb and its tip projects over the region of the proximal jejunum. A hiatal hernia is noted. Mildly distended small and large bowel loops are unchanged. LVAD device projects over the cardiac apex. AICD device projects over the right ventricle. IMPRESSION: Tip of the feeding tube projects over the proximal jejunum. Electronically Signed   By: Marybelle Killings M.D.   On: 05/28/2017 17:05   Dg Abd Portable 1v  Result Date: 05/28/2017 CLINICAL DATA:  Encounter for feeding tube placement. EXAM: PORTABLE ABDOMEN - 1 VIEW COMPARISON:  05/28/2017 FINDINGS: Feeding tube has been placed, its course looped within a hiatal hernia and tip within the region of the distal stomach. Central line, pacer lines and left ventricular  assist device appears stable. Bowel gas pattern is nonobstructive. IMPRESSION: Feeding tube tip to the distal stomach, coarse looped within a hiatal hernia. Electronically Signed   By: Nolon Nations M.D.   On: 05/28/2017 12:44   Dg Abd Portable 1v  Result Date: 05/28/2017 CLINICAL DATA:  NG tube placement. EXAM: PORTABLE ABDOMEN - 1 VIEW COMPARISON:  Abdominal x-ray dated yesterday. FINDINGS: Enteric tube seen with the tip in the gastric fundus, similar to prior study. LVAD and pacer lead again noted. Air-filled, distended large and small bowel is again seen. Multiple surgical clips noted throughout the abdomen. No radio-opaque calculi or other significant radiographic abnormality are seen. IMPRESSION: 1. Nasogastric tube with tip projecting over the gastric fundus. 2. Unchanged diffuse gaseous distention of bowel, suggestive of ileus. Electronically Signed   By: Titus Dubin M.D.   On: 05/28/2017 10:15      Medications:     Scheduled Medications: . amiodarone  200 mg Oral BID  . chlorhexidine gluconate (MEDLINE  KIT)  15 mL Mouth Rinse BID  . Chlorhexidine Gluconate Cloth  6 each Topical Q0600  . collagenase   Topical Daily  . febuxostat  120 mg Oral Daily  . insulin aspart  0-15 Units Subcutaneous Q4H  . [START ON 05/30/2017] levothyroxine  50 mcg Per NG tube QAC breakfast  . magnesium oxide  400 mg Oral Daily  . mouth rinse  15 mL Mouth Rinse 10 times per day  . mexiletine  300 mg Oral Q12H  . mirtazapine  15 mg Oral QHS  . pantoprazole sodium  40 mg Per Tube BID  . sodium chloride flush  10-40 mL Intracatheter Q12H  . sotalol  80 mg Oral Daily     Infusions: . sodium chloride Stopped (05/02/17 0630)  . sodium chloride 10 mL/hr at 05/29/17 1400  . sodium chloride    . feeding supplement (GLUCERNA 1.2 CAL) 1,000 mL (05/29/17 1401)  . meropenem (MERREM) IV Stopped (05/29/17 1007)  . vancomycin Stopped (05/29/17 1130)     PRN Medications:  acetaminophen (TYLENOL) oral  liquid 160 mg/5 mL, albuterol, docusate, hydrALAZINE, neomycin-bacitracin-polymyxin, ondansetron (ZOFRAN) IV, oxyCODONE, phenol, sodium chloride flush, traMADol   Assessment/Plan/Discussion:     1. POD 42s/p HVAD for ischemic cardiomyopathy with acute on chronic systolic heart failure, EF 15% with moderate RV dysfunction. Returned to OR for bleeding from raw mediastinal tissues. His VAD parameters have been quite stable. Co-ox 85%. CVP 7.  2.  Acute mental status changes of unclear etiology. This could be related to infection, medications, hypothyroidism, possibly stroke not see on CT. CT head from 8/7 showed lacunar infarcts that did not appear acute. Neurology is following. EEG does not show seizures activity. Treating infection and hopefully this will improve. He is better today following commands.  3.  Acute respiratory failure: reintubated yesterday for airway protection. Gas exchange is ok and CXR looks fairly clear.  4.  Acute on chronic kidney failure. Creat is up to 2.5 today. Urine output ok. May be related to sepsis, volume depletion.  5.  GI bleeding and anemia: does not appear to be bleeding at this time. His Hgb is trending down likely due to lab draws, dilution and chronic illness.  6.  VT: None since Sotalol started in addition to mexiletine and amiodarone.  7.  Fever and Leukocytosis over the past few days. Urine culture with multiple bacteria, possibly contaminant. Repeat culture sent. BC pending. On Vanc and meropenum.  8.   Sacral pressure sore: Reportedly deep to sacrum. WOC has seen and started enzymatic therapy.      I reviewed the LVAD parameters from today, and compared the results to the patient's prior recorded data.  No programming changes were made.  The LVAD is functioning within specified parameters. LVAD interrogation was negative for any significant power changes, alarms or PI events/speed drops.  LVAD equipment check completed and is in good working order.   Back-up equipment present.    Length of Stay: [place] 05/29/2017, 3:00 PM

## 2017-05-29 NOTE — Progress Notes (Signed)
ANTICOAGULATION CONSULT NOTE - Follow Up Consult  Pharmacy Consult for Warfarin >>heparin Indication: HVAD  No Active Allergies  Patient Measurements: Height: 6' (182.9 cm) Weight: 221 lb 5.5 oz (100.4 kg) IBW/kg (Calculated) : 77.6  Vital Signs: Temp: 99.9 F (37.7 C) (08/09 0323) Temp Source: Axillary (08/09 0323) BP: 97/71 (08/09 40980613) Pulse Rate: 68 (08/09 0600)  Labs:  Recent Labs  05/27/17 0419 05/28/17 0503 05/29/17 0216  HGB 9.5* 9.6* 8.5*  HCT 29.2* 30.2* 26.2*  PLT 289 300 243  LABPROT 22.4* 23.8* 30.9*  INR 1.93 2.09 2.90  CREATININE 2.04* 2.16* 2.52*    Estimated Creatinine Clearance: 41.1 mL/min (A) (by C-G formula based on SCr of 2.52 mg/dL (H)).   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 He developed an ileus on 7/5, warfarin was held, and heparin bridge started once INR fell below 1.8.  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    Warfarin resumed 7/18 without heparin bridge.  8/8 Altered mental status continues this morning, low 02 sats and patient transferred to ICU and intubated. D/w HF  Team will hold warfarin for now and start heparin once INR has trended down to <1.8.   INR 2.9 this am, hold off on anticoagulation at this time. Daily INR checks  Febrile overnight again recollecting urine, no growth in cxs. Continue vancomycin and meropenem, doses currently appropriate but renal function worsening.  Altered mental status/asterixis improved this am 8/9, now on vent. Discussed possibility of drug induced with HF team, EP, and neuro. Mexiletine and amiodarone on short list of possible drug induced causes mexiletine indicating a 10-20% risk of tremors and ataxia. Doses of amio and mexiletine have both been reduced to try and help, EP feels changes in mental status is out of proportion of what would be likely seen with mexiletine. In the end very difficult  situation as patient has recurrent VT which led to being put on multiple antiarrythmic agents. Will continue to monitor, consider trial of weaning off meds as able.   Goal of Therapy:  Heparin level goal 0.3-0.5 INR 2-2.5 Monitor platelets by anticoagulation protocol: Yes   Plan:  1) Hold warfarin for now 2) start heparin once INR<1.8 3) Continue vancomycin and meropenem 4) Continue to follow mental status and antiarrythmic agents  Sheppard CoilFrank Kayen Grabel PharmD., BCPS Clinical Pharmacist Pager [phone] 05/29/2017 8:01 AM

## 2017-05-29 NOTE — Progress Notes (Signed)
Subjective: No events overnight and no seizures on EEG  Exam: Vitals:   05/29/17 1210 05/29/17 1226  BP:  (!) 77/65  Pulse: 63 63  Resp: (!) 23 19  Temp:  100.2 F (37.9 C)  SpO2: 100% 100%   Gen: In bed, Intubated Resp: Ventilated Abd: soft, nt  Neuro: MS: Awake, alert, follows commands readily CN: Visual fields full, pupils equal round and reactive Motor: Follows commands in all 4 extremities Sensory: Endorses symmetric sensation  Pertinent Labs: Urine culture-multiple species TSH 9  Impression: 56 year old male with recurrent encephalopathy. His exam appeared most consistent with a toxic/metabolic encephalopathy and he had a urinalysis which could be consistent with UTI. Especially if he is actually hypothyroid, it could be that he became encephalopathic because of the urinary tract infection, with subsequent worsening due to an aspiration event. He appears better today than yesterday. With a negative EEG, I have very low suspicion for seizures.  Recommendations: 1) consider treatment/evaluation of hypothyroidism 2) can discontinue EEG 3) neurology will continue to follow  Ritta SlotMcNeill Shadee Rathod, MD [facility] [phone]  If 7pm- 7am, please page neurology on call as listed in AMION.

## 2017-05-29 NOTE — Progress Notes (Addendum)
Cortrak Tube Team Note:  LATE ENTRY FOR 05/28/17 @ 14:55  Cortrak team notified that Cortrak tube needed to be advanced further into the small bowel.   Tube advanced and secured with a nasal bridle at 100 cm. Per the Cortrak monitor reading the tube tip is post pyloric.   X-ray is required, abdominal x-ray has been ordered by the Cortrak team. Please confirm tube placement before using the Cortrak tube.   If the tube becomes dislodged please keep the tube and contact the Cortrak team at [email or web address] (password [email or web address]) for replacement.  If after hours and replacement cannot be delayed, place a NG tube and confirm placement with an abdominal x-ray.    Kendell BaneHeather Dody Smartt RD, LDN, CNSC [phone] Pager [phone] After Hours Pager

## 2017-05-29 NOTE — Progress Notes (Signed)
Daily Progress Note   Patient Name: James [name]       Date: 05/29/2017 DOB: January 11, 1961  Age: 56 y.o. MRN#: [id number] Attending Physician: Gaye Pollack, MD Primary Care Physician: Jacqualine Code, DO Admit Date: 03/22/2017  Reason for Consultation/Follow-up: LVAD evaluation  Subjective: [name] is intubated and more responsive today, following commands and nodding head at times.   Length of Stay: 48  Current Medications: Scheduled Meds:  . amiodarone  200 mg Oral BID  . chlorhexidine gluconate (MEDLINE KIT)  15 mL Mouth Rinse BID  . Chlorhexidine Gluconate Cloth  6 each Topical Q0600  . collagenase   Topical Daily  . febuxostat  120 mg Oral Daily  . insulin aspart  0-15 Units Subcutaneous Q4H  . [START ON 05/30/2017] levothyroxine  50 mcg Per NG tube QAC breakfast  . magnesium oxide  400 mg Oral Daily  . mouth rinse  15 mL Mouth Rinse 10 times per day  . mexiletine  300 mg Oral Q12H  . mirtazapine  15 mg Oral QHS  . pantoprazole sodium  40 mg Per Tube BID  . sodium chloride flush  10-40 mL Intracatheter Q12H  . sotalol  80 mg Oral Daily    Continuous Infusions: . sodium chloride Stopped (05/02/17 0630)  . sodium chloride 20 mL/hr at 05/29/17 1900  . sodium chloride    . feeding supplement (GLUCERNA 1.2 CAL) 1,000 mL (05/29/17 1900)  . meropenem (MERREM) IV Stopped (05/29/17 1007)  . vancomycin Stopped (05/29/17 1130)    PRN Meds: acetaminophen (TYLENOL) oral liquid 160 mg/5 mL, albuterol, docusate, hydrALAZINE, neomycin-bacitracin-polymyxin, ondansetron (ZOFRAN) IV, oxyCODONE, phenol, sodium chloride flush, traMADol  Physical Exam  Constitutional: He appears cachectic. He has a sickly appearance. He is intubated.  Cardiovascular: An irregularly irregular rhythm  present. Bradycardia present.   Pulmonary/Chest: No tachypnea. He is intubated. No respiratory distress. He has rhonchi.  Abdominal: Normal appearance.  Clean dressing over driveline left abd  Neurological:  More responsive today  Nursing note and vitals reviewed.           Vital Signs: BP (!) 80/68   Pulse 65   Temp 98.4 F (36.9 C) (Oral)   Resp 16   Ht 6' (1.829 m)   Wt 100.4 kg (221 lb 5.5 oz)   SpO2 100%  BMI 30.02 kg/m  SpO2: SpO2: 100 % O2 Device: O2 Device: Ventilator O2 Flow Rate: O2 Flow Rate (L/min): 15 L/min  Intake/output summary:   Intake/Output Summary (Last 24 hours) at 05/29/17 2105 Last data filed at 05/29/17 1900  Gross per 24 hour  Intake           996.24 ml  Output             1100 ml  Net          -103.76 ml   LBM: Last BM Date: 05/29/17 Baseline Weight: Weight: 95.3 kg (210 lb) Most recent weight: Weight: 100.4 kg (221 lb 5.5 oz)       Palliative Assessment/Data:    Flowsheet Rows     Most Recent Value  Intake Tab  Referral Department  Cardiology  Unit at Time of Referral  Cardiac/Telemetry Unit  Palliative Care Primary Diagnosis  Cardiac  Date Notified  04/14/17  Palliative Care Type  New Palliative care  Reason for referral  Other (Comment) [VAD Eval]  Date of Admission  03/23/2017  # of days IP prior to Palliative referral  3  Clinical Assessment  Psychosocial & Spiritual Assessment  Palliative Care Outcomes      Patient Active Problem List   Diagnosis Date Noted  . Aspiration pneumonia of left lower lobe due to regurgitated food ([place])   . Acute encephalopathy   . Pressure injury of skin 05/17/2017  . Echolalia   . Other tics of organic origin   . Upper GI bleed   . VT (ventricular tachycardia) ([place])   . Acute blood loss anemia   . Blood in stool   . Melena   . Elevated INR (international normalized ratio) due to prior anticoagulant medication ingestion   . Heart failure ([place])   . Ileus ([place])   . Presence of left  ventricular assist device (LVAD) ([place])   . History of gout   . Coronary artery disease involving coronary bypass graft of native heart without angina pectoris   . Chronic kidney disease   . Diabetes mellitus type 2 in nonobese (H[facility])   . Atrial fibrillation with rapid ventricular response ([place])   . Tachypnea   . Acute respiratory failure with hypoxia ([place])   . LVAD (left ventricular assist device) present ([place]) 04/06/2017  . Palliative care by specialist 04/16/2017  . DNR (do not resuscitate) discussion 04/16/2017  . Anemia, chronic disease   . Colon cancer screening   . Acute on chronic systolic CHF (congestive heart failure) ([place]) 04/04/2017  . Acute on chronic systolic heart failure, NYHA class 3 ([place]) 04/04/2017  . Acute kidney injury ([place]) 03/02/2017  . Polyarticular gout   . Renal insufficiency   . AKI (acute kidney injury) ([place]) 03/01/2017  . DM (diabetes mellitus), type 2 with renal complications ([place]) 24/82/5003  . Hyponatremia 03/01/2017  . Cardiomyopathy ([place]) 10/06/2015  . CHF (congestive heart failure) ([place]) 10/06/2015  . AICD (automatic cardioverter/defibrillator) present 09/21/2015  . CKD (chronic kidney disease) stage 3, GFR 30-59 ml/min 08/01/2015  . Depression 02/07/2012  . Chronic systolic congestive heart failure ([place]) 09/18/2011  . Congestion 08/22/2011  . Dyslipidemia 07/08/2011  . ED (erectile dysfunction) 06/25/2011  . DM (diabetes mellitus), type 2 ([place]) 12/17/2010  . Gout, unspecified 12/17/2010  . HYPONATREMIA 12/17/2010  . OVERWEIGHT/OBESITY 12/17/2010  . HYPERTENSION, BENIGN 12/17/2010  . ISCHEMIC CARDIOMYOPATHY 12/17/2010  . RENAL INSUFFICIENCY 12/17/2010  . EDEMA 12/17/2010  . CORONARY ARTERY BYPASS GRAFT, HX OF 12/17/2010  Palliative Care Assessment & Plan   HPI 56 y.o. male  admitted on 04/06/2017 with significant PMH for gout, DMII, HTN, CKD, CAD S/P CABGx6 [id number], chronic systolic heart failure and [employer] jude ICD. Evaluated April of the year  for transplant but was not felt to be candidate due to mobility limitations with gout. Followed by Dr Aundra Dubin in  HF clinic,  creatinine was trending up so he was set up for RHC and optimization for possible VAD.ECHO 04/08/2017 EF 15%, LVAD placed 6/28 but has had multiple complications including post op mediastinal hematoma, ileus, VT, GIB, AMS, and has been extubated 3 times and now currently just placed back on ventilator for possible aspiration pneumonia. He has struggled with severe debilitation/weakness and intake along with severe gout.   Assessment: Wife, Corky Sox, is at bedside. She has recently spoken with Dr. Haroldine Laws. She is thinking about all her husband has been through. She tearfully tells me that she will have to talk with her son. At this time their son, Fannie Knee, walks in. She introduced me as palliative care to him and he seemed instantly upset and says "that's it then?" I explained that palliative care is involved with all LVAD patients from the beginning and that we have been following along with his parents during the entirety of Mr. [facility] hospital stay. Offered emotional support. I told [name] that [facility] and I have been talking and Stanton Kidney will return on Sunday for further support.   Recommendations/Plan:  Per heart failure and PCCM  Code Status:  Limited code - no CPR with LVAD  Prognosis:   Unable to determine  Discharge Planning:  To Be Determined  Thank you for allowing the Palliative Medicine Team to assist in the care of this patient.   Total Time 26mn Prolonged Time Billed  no       Greater than 50%  of this time was spent counseling and coordinating care related to the above assessment and plan.  AVinie Sill NP Palliative Medicine Team Pager # [phone](M-F 8a-5p) Team Phone # [phone](Nights/Weekends)

## 2017-05-29 NOTE — Progress Notes (Signed)
LTM EEG D/C'd per Dr Kirkpatrick 

## 2017-05-29 NOTE — Progress Notes (Signed)
Inpatient Rehabilitation  Continuing to follow at a distance for medical readiness and post acute therapy needs.  Please call with questions.   Charlane FerrettiMelissa Nicholai Willette, M.A., CCC/SLP Admission Coordinator  [facility]  Cell [phone]

## 2017-05-29 NOTE — Progress Notes (Signed)
PT Cancellation Note  Patient Details Name: James [name] MRN: [id number] DOB: 06-20-1961   Cancelled Treatment:    Reason Eval/Treat Not Completed: Medical issues which prohibited therapy PT will continue to follow acutely.    Derek MoundKellyn R Ianmichael Amescua Trisa Cranor, PTA Pager: [phone]   05/29/2017, 8:21 AM

## 2017-05-29 NOTE — Progress Notes (Signed)
PULMONARY / CRITICAL CARE MEDICINE   Name: James [name] MRN: [id number] DOB: [birth year]    ADMISSION DATE:  03/31/2017 CONSULTATION DATE:  04/23/2017  REFERRING MD:  Laneta Simmers - CVTS  CHIEF COMPLAINT:  Acute respiratory failure  BRIEF SUMMARY:   [name] with CHF (EF 15%), AICD, CAD, Gout, HTN, CKD, DM, Anemia, admitted 03/21/2017 for VAD placement, as he had already been turned down by [facility] for transplant due to his gout and limitations with his mobility. Now s/p HVAD placement for his ischemic cardiomyopathy. His course has been complicated by AKI-on-CKD, and also complicated by pre-op Afib with RVR. Developed acute hypoxic respiratory failure requiring 15L O2 NRB, and he developed worsening respiratory distress, prompting ICU consult.  He was intubated 7/5 for respiratory arrest, aspiration PNA > developed septic shock / required vasopressors.   Extubated 7/6.  He was reintubated again on 7/17 when he decompensated during EGD. He was extubated on 7/18.  Hospital course complicated by recurrent episodes of SVT, SVT, GI bleed. On the morning of 8/8 he became more somnolent with altered mental status, increased work of breathing. NG tube was noted to be displaced with likely aspiration. PCCM consulted for help with management.  SUBJECTIVE:   Febrile overnight, Tmax 102 Urinary retention -> to obtain foley cortrak placed- TF restarted  VITAL SIGNS: BP (!) 87/69   Pulse 69   Temp 99.5 F (37.5 C) (Oral)   Resp (!) 24   Ht 6' (1.829 m)   Wt 221 lb 5.5 oz (100.4 kg)   SpO2 100%   BMI 30.02 kg/m   HEMODYNAMICS: CVP:  [7 mmHg] 7 mmHg  VENTILATOR SETTINGS: Vent Mode: PRVC FiO2 (%):  [40 %-100 %] 40 % Set Rate:  [14 bmp] 14 bmp Vt Set:  [620 mL] 620 mL PEEP:  [5 cmH20] 5 cmH20 Plateau Pressure:  [14 cmH20-24 cmH20] 18 cmH20  INTAKE / OUTPUT: I/O last 3 completed shifts: In: 3317.2 [I.V.:1830; NG/GT:837.2; IV Piggyback:650] Out: 1825 [Urine:1825]  PHYSICAL EXAMINATION: Gen:       Chronically ill appearing man in NAD on MV HEENT:  sclera anicteric, mucous membranes dry, ETT, coretrak to R nare Lungs:    Diffuse coarse rhonchi, even/non-labored on MV CV:         RRR, mechanical heart sounds, drive line to LUQ Abd:      + bowel sounds; soft, non-tender;  no distension Ext:    Warm, diffuse 3+ edema, bilateral heel boots  Neuro:     Sleepy, awakes to verbal, moves all extremities  LABS:  BMET  Recent Labs Lab 05/27/17 0419 05/28/17 0503 05/29/17 0216  NA 137 137 140  K 4.2 4.4 4.3  CL 106 108 111  CO2 22 20* 19*  BUN 47* 47* 51*  CREATININE 2.04* 2.16* 2.52*  GLUCOSE 145* 111* 104*   Electrolytes  Recent Labs Lab 05/27/17 0419 05/28/17 0503 05/29/17 0216  CALCIUM 8.0* 7.9* 7.6*  MG 1.9 2.1 2.0   CBC  Recent Labs Lab 05/27/17 0419 05/28/17 0503 05/29/17 0216  WBC 15.2* 16.5* 17.0*  HGB 9.5* 9.6* 8.5*  HCT 29.2* 30.2* 26.2*  PLT 289 300 243   Coag's  Recent Labs Lab 05/27/17 0419 05/28/17 0503 05/29/17 0216  INR 1.93 2.09 2.90   Sepsis Markers  Recent Labs Lab 05/28/17 1017 05/29/17 0216 05/29/17 0230  LATICACIDVEN  --   --  1.1  PROCALCITON 0.99 1.49  --    ABG  Recent Labs Lab 05/28/17 1132  PHART 7.352  PCO2ART 37.6  PO2ART 360.0*   Liver Enzymes No results for input(s): AST, ALT, ALKPHOS, BILITOT, ALBUMIN in the last 168 hours.  Cardiac Enzymes No results for input(s): TROPONINI, PROBNP in the last 168 hours.  Glucose  Recent Labs Lab 05/28/17 1623 05/28/17 1943 05/28/17 2349 05/29/17 0032 05/29/17 0333 05/29/17 0819  GLUCAP 89 89 78 109* 100* 81    Imaging Dg Chest Port 1 View  Result Date: 05/29/2017 CLINICAL DATA:  Endotracheal tube, LVAD EXAM: PORTABLE CHEST 1 VIEW COMPARISON:  05/28/2017 FINDINGS: Endotracheal tube has tip 2.2 cm above the carina. Right-sided PICC line has been advanced slightly with tip just below the cavoatrial junction. LVAD unchanged. Left-sided pacemaker unchanged. Enteric  tube courses into the stomach as tip is not visualized. Lungs are hypoinflated with mild prominence of the perihilar markings suggesting mild vascular congestion. No definite effusion or pneumothorax. Mild stable cardiomegaly. Remainder of the exam is unchanged. IMPRESSION: Mild stable cardiomegaly with minimal vascular congestion. Multiple tubes and lines as described. Note that there has been mild interval advancement of the right-sided PICC line with tip just below the cavoatrial junction. Electronically Signed   By: Elberta Fortisaniel  Boyle M.D.   On: 05/29/2017 08:18   Dg Chest Port 1 View  Result Date: 05/28/2017 CLINICAL DATA:  Hypoxia EXAM: PORTABLE CHEST 1 VIEW COMPARISON:  Study obtained earlier in the day FINDINGS: Endotracheal tube tip is 3.4 cm above the carina. Orogastric tube tip and side port are in the stomach. Central catheter tip is in the superior vena cava. Pacemaker tip is in the right ventricle region. There is a left ventricular assist device in place. No pneumothorax. There is slight left base atelectasis. Lungs elsewhere clear. Heart is borderline enlarged, stable. The pulmonary vascularity is normal. There is aortic atherosclerosis. No adenopathy. There is postoperative change in the right shoulder. IMPRESSION: Tube and catheter positions as described without pneumothorax. Slight left base atelectasis. No edema or consolidation. Stable cardiac silhouette. There is aortic atherosclerosis. Aortic Atherosclerosis (ICD10-I70.0). Electronically Signed   By: Bretta BangWilliam  Woodruff III M.D.   On: 05/28/2017 10:17   Dg Abd Portable 1v  Result Date: 05/28/2017 CLINICAL DATA:  Feeding tube placement EXAM: PORTABLE ABDOMEN - 1 VIEW COMPARISON:  05/28/2017 1200 hour FINDINGS: The feeding tube follows the course of the stomach, duodenal bulb and its tip projects over the region of the proximal jejunum. A hiatal hernia is noted. Mildly distended small and large bowel loops are unchanged. LVAD device projects over  the cardiac apex. AICD device projects over the right ventricle. IMPRESSION: Tip of the feeding tube projects over the proximal jejunum. Electronically Signed   By: Jolaine ClickArthur  Hoss M.D.   On: 05/28/2017 17:05   Dg Abd Portable 1v  Result Date: 05/28/2017 CLINICAL DATA:  Encounter for feeding tube placement. EXAM: PORTABLE ABDOMEN - 1 VIEW COMPARISON:  05/28/2017 FINDINGS: Feeding tube has been placed, its course looped within a hiatal hernia and tip within the region of the distal stomach. Central line, pacer lines and left ventricular assist device appears stable. Bowel gas pattern is nonobstructive. IMPRESSION: Feeding tube tip to the distal stomach, coarse looped within a hiatal hernia. Electronically Signed   By: Norva PavlovElizabeth  Brown M.D.   On: 05/28/2017 12:44   Dg Abd Portable 1v  Result Date: 05/28/2017 CLINICAL DATA:  NG tube placement. EXAM: PORTABLE ABDOMEN - 1 VIEW COMPARISON:  Abdominal x-ray dated yesterday. FINDINGS: Enteric tube seen with the tip in the gastric fundus, similar to prior study. LVAD and  pacer lead again noted. Air-filled, distended large and small bowel is again seen. Multiple surgical clips noted throughout the abdomen. No radio-opaque calculi or other significant radiographic abnormality are seen. IMPRESSION: 1. Nasogastric tube with tip projecting over the gastric fundus. 2. Unchanged diffuse gaseous distention of bowel, suggestive of ileus. Electronically Signed   By: Obie Dredge M.D.   On: 05/28/2017 10:15   STUDIES:  None  CULTURES: Blood 7/5 >> neg Sputum 7/5 >>klebsieall, citrobacter Sputum 7/7 >> klebsiella - resistant to amp otherwise sens [facility] 8/7 >> UC 8/7 >> Sputum 8/8 >>  ANTIBIOTICS: Vancomycin 7/5 >> 7/7; 8/8 >> Zosyn 7/5 >> 7/13 Meropenem 8/8 >>  SIGNIFICANT EVENTS: 7/05  Respiratory arrest post feculent vomitus episode 7/06  Extubated  7/17 Intubated during EGD 7/18 Extubated 8/6 Resp distress, aspirated. intubated  LINES/TUBES: ETT 7/5 >>  7/6, 7/17-7/18 Double lumen PICC on the right 7/2 >>  ASSESSMENT / PLAN: [name] with CHF (EF 15%), AICD, CAD, Gout, HTN, CKD, DM, Anemia, admitted 03/29/2017 for VAD placement, now s/p HVAD placement, with course complicated by AKI-on-CKD and pre-op Afib with RVR, GI bleed recurrent arrhythmia.  Decompensated on 8/8 requiring intubation.  PULMONARY A: Acute hypoxic respiratory failure requiring intubation 8/8- likely secondary to aspiration of tube feeds  Protein calorie malnutrition  - CXR with mild vascular congestion, BNP 632 P: Rest today PRVC 8cc/kg per primary team Consider SBT 8/10 Trend CXR, ABG Follow cultures Trend PCT Continue vanco and meropenem, narrow as able Trend Pct Consider gentle diuresis   Acute on Chronic Systolic CHF / ICM - [employer] Jude ICD, turned down for transplant S/p HVAD Atrial Fibrillation  Reccurent VT Mediastinal Hematoma s/p Evacuation post LVAD CHF (EF 15%); CAD; History HTN AKI on CKD stage 3 - TSH 9.45 on 8/7 P: Per CVTS and Heart failure team Check T4 free, will start synthroid 50 mcg per tube daily Recheck TSH in 6 weeks  CCT 35 mins  Posey Boyer, AGACNP-BC [facility] Pgr: [phone] or if no answer [phone] 05/29/2017, 10:03 AM

## 2017-05-30 ENCOUNTER — Inpatient Hospital Stay (HOSPITAL_COMMUNITY): Payer: Medicare PPO

## 2017-05-30 DIAGNOSIS — J9600 Acute respiratory failure, unspecified whether with hypoxia or hypercapnia: Secondary | ICD-10-CM

## 2017-05-30 LAB — COOXEMETRY PANEL
Carboxyhemoglobin: 1.4 % (ref 0.5–1.5)
Carboxyhemoglobin: 0.8 % (ref 0.5–1.5)
Methemoglobin: 1 % (ref 0.0–1.5)
Methemoglobin: 1.2 % (ref 0.0–1.5)
O2 Saturation: 84.6 %
O2 Saturation: 79.2 %
TOTAL HEMOGLOBIN: 10 g/dL — AB (ref 12.0–16.0)
Total hemoglobin: 8.4 g/dL — ABNORMAL LOW (ref 12.0–16.0)

## 2017-05-30 LAB — CBC
HEMATOCRIT: 26.4 % — AB (ref 39.0–52.0)
HEMOGLOBIN: 8.5 g/dL — AB (ref 13.0–17.0)
MCH: 28.5 pg (ref 26.0–34.0)
MCHC: 32.2 g/dL (ref 30.0–36.0)
MCV: 88.6 fL (ref 78.0–100.0)
Platelets: 250 10*3/uL (ref 150–400)
RBC: 2.98 MIL/uL — AB (ref 4.22–5.81)
RDW: 18.2 % — ABNORMAL HIGH (ref 11.5–15.5)
WBC: 23.7 10*3/uL — ABNORMAL HIGH (ref 4.0–10.5)

## 2017-05-30 LAB — GLUCOSE, CAPILLARY
GLUCOSE-CAPILLARY: 118 mg/dL — AB (ref 65–99)
GLUCOSE-CAPILLARY: 125 mg/dL — AB (ref 65–99)
GLUCOSE-CAPILLARY: 147 mg/dL — AB (ref 65–99)
Glucose-Capillary: 114 mg/dL — ABNORMAL HIGH (ref 65–99)
Glucose-Capillary: 122 mg/dL — ABNORMAL HIGH (ref 65–99)
Glucose-Capillary: 147 mg/dL — ABNORMAL HIGH (ref 65–99)
Glucose-Capillary: 170 mg/dL — ABNORMAL HIGH (ref 65–99)

## 2017-05-30 LAB — BASIC METABOLIC PANEL
Anion gap: 9 (ref 5–15)
Anion gap: 9 (ref 5–15)
BUN: 55 mg/dL — AB (ref 6–20)
BUN: 59 mg/dL — ABNORMAL HIGH (ref 6–20)
CHLORIDE: 112 mmol/L — AB (ref 101–111)
CHLORIDE: 113 mmol/L — AB (ref 101–111)
CO2: 20 mmol/L — AB (ref 22–32)
CO2: 19 mmol/L — AB (ref 22–32)
Calcium: 7.6 mg/dL — ABNORMAL LOW (ref 8.9–10.3)
Calcium: 7.7 mg/dL — ABNORMAL LOW (ref 8.9–10.3)
Creatinine, Ser: 2.59 mg/dL — ABNORMAL HIGH (ref 0.61–1.24)
Creatinine, Ser: 2.62 mg/dL — ABNORMAL HIGH (ref 0.61–1.24)
GFR calc Af Amer: 31 mL/min — ABNORMAL LOW (ref >60)
GFR calc Af Amer: 30 mL/min — ABNORMAL LOW (ref >60)
GFR calc non Af Amer: 26 mL/min — ABNORMAL LOW (ref >60)
GFR calc non Af Amer: 26 mL/min — ABNORMAL LOW (ref >60)
GLUCOSE: 135 mg/dL — AB (ref 65–99)
Glucose, Bld: 166 mg/dL — ABNORMAL HIGH (ref 65–99)
POTASSIUM: 4.1 mmol/L (ref 3.5–5.1)
POTASSIUM: 4.4 mmol/L (ref 3.5–5.1)
SODIUM: 141 mmol/L (ref 135–145)
Sodium: 141 mmol/L (ref 135–145)

## 2017-05-30 LAB — URINE CULTURE: Culture: NO GROWTH

## 2017-05-30 LAB — CULTURE, RESPIRATORY W GRAM STAIN

## 2017-05-30 LAB — PROCALCITONIN: PROCALCITONIN: 1.33 ng/mL

## 2017-05-30 LAB — PROTIME-INR
INR: 3.15
PROTHROMBIN TIME: 33.1 s — AB (ref 11.4–15.2)

## 2017-05-30 LAB — CULTURE, RESPIRATORY: SPECIAL REQUESTS: NORMAL

## 2017-05-30 LAB — T4, FREE: Free T4: 0.89 ng/dL (ref 0.61–1.12)

## 2017-05-30 LAB — LACTATE DEHYDROGENASE: LDH: 158 U/L (ref 98–192)

## 2017-05-30 LAB — PREPARE RBC (CROSSMATCH)

## 2017-05-30 MED ORDER — MIRTAZAPINE 15 MG PO TABS: 15.0000 mg | ORAL_TABLET | Freq: Every day | ORAL | Status: DC

## 2017-05-30 MED ORDER — MAGNESIUM OXIDE 400 (241.3 MG) MG PO TABS
400.0000 mg | ORAL_TABLET | Freq: Every day | ORAL | Status: DC
  Administered 2017-05-30 – 2017-06-25 (×27): 400 mg
  Filled 2017-05-30 (×27): qty 1

## 2017-05-30 MED ORDER — TRAMADOL HCL 50 MG PO TABS: 50.0000 mg | ORAL_TABLET | ORAL | Status: DC | PRN

## 2017-05-30 MED ORDER — SODIUM CHLORIDE 0.9 % IV SOLN
Freq: Once | INTRAVENOUS | Status: AC
  Administered 2017-05-30: 20:00:00 via INTRAVENOUS

## 2017-05-30 MED ORDER — SOTALOL HCL 80 MG PO TABS
80.0000 mg | ORAL_TABLET | Freq: Every day | ORAL | Status: DC
  Administered 2017-05-30 – 2017-06-25 (×25): 80 mg
  Filled 2017-05-30 (×28): qty 1

## 2017-05-30 MED ORDER — ALBUMIN HUMAN 25 % IV SOLN
12.5000 g | Freq: Four times a day (QID) | INTRAVENOUS | Status: AC
  Administered 2017-05-30 – 2017-06-01 (×8): 12.5 g via INTRAVENOUS
  Filled 2017-05-30 (×8): qty 50

## 2017-05-30 MED ORDER — SODIUM CHLORIDE 0.9 % IV SOLN: Freq: Once | INTRAVENOUS | Status: AC

## 2017-05-30 MED ORDER — VALPROATE SODIUM 500 MG/5ML IV SOLN
500.0000 mg | Freq: Three times a day (TID) | INTRAVENOUS | Status: DC
  Filled 2017-05-30 (×2): qty 5

## 2017-05-30 MED ORDER — AMIODARONE HCL 200 MG PO TABS
200.0000 mg | ORAL_TABLET | Freq: Two times a day (BID) | ORAL | Status: DC
  Administered 2017-05-30 – 2017-06-15 (×34): 200 mg
  Filled 2017-05-30 (×34): qty 1

## 2017-05-30 MED ORDER — DEXTROSE 5 % IV SOLN
500.0000 mg | Freq: Three times a day (TID) | INTRAVENOUS | Status: DC
  Administered 2017-05-30 – 2017-05-31 (×2): 500 mg via INTRAVENOUS
  Filled 2017-05-30 (×3): qty 5

## 2017-05-30 MED ORDER — NOREPINEPHRINE BITARTRATE 1 MG/ML IV SOLN
0.0000 ug/min | INTRAVENOUS | Status: DC
  Administered 2017-05-30: 2 ug/min via INTRAVENOUS
  Administered 2017-06-01: 8 ug/min via INTRAVENOUS
  Filled 2017-05-30 (×3): qty 16

## 2017-05-30 MED ORDER — MEXILETINE HCL 150 MG PO CAPS
300.0000 mg | ORAL_CAPSULE | Freq: Two times a day (BID) | ORAL | Status: DC
  Administered 2017-05-30 – 2017-06-25 (×53): 300 mg
  Filled 2017-05-30 (×54): qty 2

## 2017-05-30 MED ORDER — OXYCODONE HCL 5 MG/5ML PO SOLN
5.0000 mg | ORAL | Status: DC | PRN
  Administered 2017-05-31 – 2017-06-04 (×11): 10 mg
  Administered 2017-06-05: 5 mg
  Administered 2017-06-05: 10 mg
  Administered 2017-06-09: 5 mg
  Administered 2017-06-10 – 2017-06-22 (×10): 10 mg
  Filled 2017-05-30 (×3): qty 10
  Filled 2017-05-30: qty 5
  Filled 2017-05-30 (×18): qty 10
  Filled 2017-05-30: qty 5
  Filled 2017-05-30 (×2): qty 10

## 2017-05-30 MED ORDER — VALPROATE SODIUM 500 MG/5ML IV SOLN
1500.0000 mg | Freq: Once | INTRAVENOUS | Status: AC
  Administered 2017-05-30: 1500 mg via INTRAVENOUS
  Filled 2017-05-30: qty 15

## 2017-05-30 NOTE — Progress Notes (Signed)
ANTICOAGULATION CONSULT NOTE - Follow Up Consult  Pharmacy Consult for Warfarin >>heparin Indication: HVAD  No Active Allergies  Patient Measurements: Height: 6' (182.9 cm) Weight: 217 lb 9.5 oz (98.7 kg) IBW/kg (Calculated) : 77.6  Vital Signs: Temp: 99.3 F (37.4 C) (08/10 0801) Temp Source: Axillary (08/10 0801) BP: 73/57 (08/10 0600) Pulse Rate: 61 (08/10 0600)  Labs:  Recent Labs  05/28/17 0503 05/29/17 0216 05/30/17 0310  HGB 9.6* 8.5* 8.5*  HCT 30.2* 26.2* 26.4*  PLT 300 243 250  LABPROT 23.8* 30.9* 33.1*  INR 2.09 2.90 3.15  CREATININE 2.16* 2.52* 2.59*    Estimated Creatinine Clearance: 39.7 mL/min (A) (by C-G formula based on SCr of 2.59 mg/dL (H)).   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 He developed an ileus on 7/5, warfarin was held, and heparin bridge started once INR fell below 1.8.  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    Warfarin resumed 7/18 without heparin bridge.  8/8 Altered mental status continues this morning, low 02 sats and patient transferred to ICU and intubated. D/w HF  Team will hold warfarin for now and start heparin once INR has trended down to <1.8.   INR 3.1 this am, hold off on anticoagulation at this time. Daily INR checks for now.  Tmax 100.2 overnight, No growth in cxs. Continue vancomycin and meropenem, doses currently appropriate but renal function worsening will likely need vanc random this weekend before steady state.  Altered mental status/asterixis improved this am 8/9, now on vent. Discussed possibility of drug induced with HF team, EP, and neuro. Mexiletine and amiodarone on short list of possible drug induced causes mexiletine indicating a 10-20% risk of tremors and ataxia. Doses of amio and mexiletine have both been reduced to try and help, EP feels changes in mental status is out of proportion of what would be likely seen  with mexiletine. In the end very difficult situation as patient has recurrent VT which led to being put on multiple antiarrythmic agents. Will continue to monitor, consider trial of weaning off meds as able.   Goal of Therapy:  Heparin level goal 0.3-0.5 INR 2-2.5 Monitor platelets by anticoagulation protocol: Yes   Plan:  1) Hold warfarin for now 2) start heparin once INR<1.8 3) Continue vancomycin and meropenem 4) Continue to follow mental status and antiarrythmic agents  Sheppard CoilFrank Wilson PharmD., BCPS Clinical Pharmacist Pager [phone] 05/30/2017 8:27 AM

## 2017-05-30 NOTE — Progress Notes (Signed)
 Electrophysiology Rounding Note  Patient Name: James [name] Date of Encounter: 05/30/2017  Primary Cardiologist: Bensimhon Electrophysiologist: Camnitz   Subjective   The patient remains intubated today. Responsive on vent. Denies pain, shakes head that he is tired.  Inpatient Medications    Scheduled Meds: . amiodarone  200 mg Per Tube BID  . chlorhexidine gluconate (MEDLINE KIT)  15 mL Mouth Rinse BID  . Chlorhexidine Gluconate Cloth  6 each Topical Q0600  . collagenase   Topical Daily  . febuxostat  120 mg Oral Daily  . insulin aspart  0-15 Units Subcutaneous Q4H  . levothyroxine  50 mcg Per NG tube QAC breakfast  . magnesium oxide  400 mg Per Tube Daily  . mouth rinse  15 mL Mouth Rinse 10 times per day  . mexiletine  300 mg Per Tube Q12H  . mirtazapine  15 mg Per Tube QHS  . pantoprazole sodium  40 mg Per Tube BID  . sodium chloride flush  10-40 mL Intracatheter Q12H  . sotalol  80 mg Per Tube Daily   Continuous Infusions: . sodium chloride Stopped (05/02/17 0630)  . sodium chloride 20 mL/hr at 05/30/17 0200  . sodium chloride    . feeding supplement (GLUCERNA 1.2 CAL) 1,000 mL (05/30/17 0400)  . meropenem (MERREM) IV Stopped (05/29/17 2224)  . norepinephrine (LEVOPHED) Adult infusion 2 mcg/min (05/30/17 0800)  . vancomycin 1,250 mg (05/30/17 1021)   PRN Meds: acetaminophen (TYLENOL) oral liquid 160 mg/5 mL, albuterol, docusate, hydrALAZINE, neomycin-bacitracin-polymyxin, ondansetron (ZOFRAN) IV, oxyCODONE, phenol, sodium chloride flush, traMADol   Vital Signs    Vitals:   05/30/17 0500 05/30/17 0600 05/30/17 0801 05/30/17 0910  BP: (!) 85/59 (!) 73/57    Pulse: 63 61    Resp: 17 18    Temp:   99.3 F (37.4 C)   TempSrc:   Axillary   SpO2: 100% 100%  100%  Weight:      Height:        Intake/Output Summary (Last 24 hours) at 05/30/17 1032 Last data filed at 05/30/17 0400  Gross per 24 hour  Intake           849.32 ml  Output             1035 ml    Net          -185.68 ml   Filed Weights   05/28/17 0438 05/29/17 0600 05/30/17 0253  Weight: 235 lb 11.2 oz (106.9 kg) 221 lb 5.5 oz (100.4 kg) 217 lb 9.5 oz (98.7 kg)    Physical Exam    GEN- The patient is intubated, responds appropriately to quesetions Head- normocephalic, atraumatic Eyes-  Sclera clear, conjunctiva pink Ears- hearing intact Oropharynx- +ETT Neck- supple, +central line  Lungs- +vent Heart- Regular rate and rhythm (paced) GI- soft, NT, ND, + BS Extremities- no clubbing, cyanosis, 3+ bilateral LE edema Skin- no rash or lesion Neuro- follows commands   Labs    CBC  Recent Labs  05/29/17 0216 05/30/17 0310  WBC 17.0* 23.7*  HGB 8.5* 8.5*  HCT 26.2* 26.4*  MCV 87.9 88.6  PLT 243 250   Basic Metabolic Panel  Recent Labs  05/28/17 0503 05/29/17 0216 05/30/17 0310  NA 137 140 141  K 4.4 4.3 4.1  CL 108 111 112*  CO2 20* 19* 20*  GLUCOSE 111* 104* 135*  BUN 47* 51* 55*  CREATININE 2.16* 2.52* 2.59*  CALCIUM 7.9* 7.6* 7.6*  MG 2.1 2.0  --      Thyroid Function Tests  Recent Labs  05/27/17 1936  TSH 9.450*    Telemetry    Sinus rhythm with asynchronous V pacing at 70 (personally reviewed)  Radiology    Dg Chest Port 1 View  Result Date: 05/30/2017 CLINICAL DATA:  Hypoxia EXAM: PORTABLE CHEST 1 VIEW COMPARISON:  May 29, 2017 FINDINGS: Endotracheal tube tip is 2.9 cm above the carina. Central catheter tip is in the superior vena cava near the cavoatrial junction. Feeding tube extends below the diaphragm. Pacemaker lead is attached the right ventricle. Left ventricular assist device is unchanged in position. No pneumothorax. Cardiomegaly is stable. There is no appreciable edema or consolidation. Pulmonary vascularity is within normal limits. No adenopathy. No evident bone lesions. IMPRESSION: Tube and catheter positions as described without evident pneumothorax. Stable cardiomegaly. No edema or consolidation. Electronically Signed   By:  William  Woodruff III M.D.   On: 05/30/2017 08:07    Patient Profile     56 yo with CAD s/p CABG, ischemic cardiomyopathy/chronic systolic CHF, tophaceous gout, and CKD stage 3 was admitted for diuresis and consideration for LVAD placement. S/p HVAD on 6/28. Recurrent VT, on amio, sotalol, and mexiletine. Recent bradycardia, device reprogrammed to VVI 70 with underlying SB  Assessment & Plan    1.  Ventricular tachycardia Recurrent and persistent despite triple AAD therapy He has been relatively quiet over the last couple of days from this standpoint, however, developed symptomatic bradycardia and hypotension.  Currently has single chamber ICD and backup pacing rate increased to 70. We could consider decreasing amio or sotalol further to see if sinus rates would improve vs leaving V pacing at higher rate to support BP Discussed with Dr Bensimhon, for now, will leave as is. He is not a candidate for upgrade to dual chamber device with multiple comorbidities Keep K>3.9, Mg >1.8  2.  Acute on chronic HF s/p HVAD Per AHF team  Dr Camnitz to see later today.    Signed, Amber Seiler, NP  05/30/2017, 10:32 AM   I have seen and examined this patient with Amber Seiler.  Agree with above, note added to reflect my findings.  On exam, RRR, no murmurs, lungs clear. No further VT but is now pacing at 70 after having bradycardia and hypotension. Pacing is asynchronous as he does not have an A lead. As he has had so much VT, would hesitate to adjust amiodarone or sotalol. If he becomes symptomatic with RV only pacing, could try amio dosing BID.     Will M. Camnitz MD 05/30/2017 3:05 PM   

## 2017-05-30 NOTE — Progress Notes (Addendum)
Chaplain stopped by and spoke with the family of the patient.  Wife is teary and making meaning of all of the changes that are happening.  Her parents arrived to provide additional support.  Chaplain will continue to follow this family.  Chaplain would like to thank the medical team for their care for this patient and family.    05/29/17 1620  Clinical Encounter Type  Visited With Family  Visit Type Follow-up;Psychological support;Spiritual support;Social support

## 2017-05-30 NOTE — Progress Notes (Addendum)
Patient ID: James [name], male   DOB: 05/18/1961, 56 y.o.   MRN: [id number]   Advanced Heart Failure VAD Team Note  Subjective:    Events: --HVAD placed 6/28.  Returned to the OR that evening with high chest tube output, evacuation of mediastinal hematoma.  --Extubated 6/29. Milrinone stopped on 7/4. --7/4, patient developed ileus with respiratory compromise. NGT placed with 3 L suctioned out.  CXR with suspicion for aspiration PNA.  Patient had to be intubated.  He went into atrial fibrillation with RVR.  He became hypotensive and was started on norepinephrine and phenylephrine.  Amiodarone gtt begun.    --Extubated again on 7/6.  -- 7/13 Had 2 sustained episodes of VT. Had to be cardioverted 1. Second episode broke with overdrive pacing with Dr. Caryl Comes. VAD speed turned down to 2700.  --Melena due to acuteGI bleed --> 2 units PRBCs 7/14, 2 units 7/15. 3 U PRBCs 7/16,  05/14/2017 3 UPRBCs.  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  He had epinephrine, APC, and 2 clips. Intubated prior to procedure and placed on norepinephrine. Speed dropped to 2660.  --7/18 Back in VT with overdrive pacing. Lidocaine was started in addition to amiodarone. Extubated. --7/19 lidocaine stopped with confusion. Neuro consulted.  EEG normal.  CT of head no acute findings. 7/20 confusion had resolved.  --1 unit PRBCs 7/20.  --7/22, developed sustained VT with rate around 130 shortly after getting IV Lasix.  We were able to pace him out and back to NSR.  He was put back on amiodarone gtt.  --More VT on 7/25, paced out.  Back on IV amiodarone gtt.  NSR.  -- 7/31 Ramp ECHO decrease speed 2600 rpm.  --8/1 VT- paced out --8/2 VT - started on sotalol --8/6 Hgb 7.5, 1 unit PRBCs - 8/7 Hgb 9.5, confused.  Head CT with right superior cerebellar artery lacunar infarcts, not acute. -8/8 Possible aspiration. Reintubated.   Remains intubated  40% and PEEP 5. Follows commands.  Overnight MAPs dropped so norpei started at 2 mcg. Creatinine 2.5.    HVAD INTERROGATION:  HVAD:  Flow 7.4 liters/min, speed 2600,  power 4 W,  Peak 7.4 Trough 3.3  1 suction alarm  Suction On. Lavare On.  Objective:    Vital Signs:   Temp:  [98.1 F (36.7 C)-100.2 F (37.9 C)] 99.3 F (37.4 C) (08/10 0801) Pulse Rate:  [61-69] 61 (08/10 0600) Resp:  [15-26] 18 (08/10 0600) BP: (67-94)/(48-69) 73/57 (08/10 0600) SpO2:  [99 %-100 %] 100 % (08/10 0600) FiO2 (%):  [40 %] 40 % (08/10 0430) Weight:  [217 lb 9.5 oz (98.7 kg)] 217 lb 9.5 oz (98.7 kg) (08/10 0253) Last BM Date: 05/29/17 Mean arterial Pressure 60-70s  Intake/Output:   Intake/Output Summary (Last 24 hours) at 05/30/17 0838 Last data filed at 05/30/17 0400  Gross per 24 hour  Intake          1371.07 ml  Output             1035 ml  Net           336.07 ml     Physical Exam  CVP 10-11 Physical Exam: GENERAL: Intubated. Opens eyes. HEENT: ETT [place] NECK: Supple, JVP hard to assess with lines .  2+ bilaterally, no bruits.  No lymphadenopathy or thyromegaly appreciated.   CARDIAC:  Mechanical heart sounds with LVAD hum present.  LUNGS:  Coarse throughout .   ABDOMEN:  Soft,  round, nontender, positive bowel sounds x4.     LVAD exit site: well-healed and incorporated.  Dressing dry and intact.  No erythema or drainage.  Stabilization device present and accurately applied.  Driveline dressing is being changed daily per sterile technique. EXTREMITIES:  Warm and dry, no cyanosis, clubbing, rash. R and LLE 3+  RUE PICC NEUROLOGIC:  Intubated follows commands.  Skin: sacrum. Unstageable pressure ulcer       Telemetry   Personally reviewed A sensed V paced 40-70s. Back up pacer increased. No VT   Labs   Basic Metabolic Panel:  Recent Labs Lab 05/25/17 0347 05/26/17 0413 05/27/17 0419 05/28/17 0503 05/29/17 0216 05/30/17 0310  NA 135 137 137 137 140 141  K 3.6 4.2 4.2 4.4 4.3 4.1  CL 106 108 106 108 111  112*  CO2 23 22 22  20* 19* 20*  GLUCOSE 135* 150* 145* 111* 104* 135*  BUN 44* 45* 47* 47* 51* 55*  CREATININE 1.73* 1.98* 2.04* 2.16* 2.52* 2.59*  CALCIUM 7.8* 7.7* 8.0* 7.9* 7.6* 7.6*  MG 1.7 2.0 1.9 2.1 2.0  --     Liver Function Tests: No results for input(s): AST, ALT, ALKPHOS, BILITOT, PROT, ALBUMIN in the last 168 hours. No results for input(s): LIPASE, AMYLASE in the last 168 hours.  Recent Labs Lab 05/27/17 0800  AMMONIA 20    CBC:  Recent Labs Lab 05/26/17 0413 05/27/17 0419 05/28/17 0503 05/29/17 0216 05/30/17 0310  WBC 17.8* 15.2* 16.5* 17.0* 23.7*  NEUTROABS 15.8*  --   --   --   --   HGB 7.5* 9.5* 9.6* 8.5* 8.5*  HCT 23.4* 29.2* 30.2* 26.2* 26.4*  MCV 90.3 90.4 88.8 87.9 88.6  PLT 293 289 300 243 250    INR:  Recent Labs Lab 05/26/17 0413 05/27/17 0419 05/28/17 0503 05/29/17 0216 05/30/17 0310  INR 2.05 1.93 2.09 2.90 3.15    Other results:     Imaging   Dg Chest Port 1 View  Result Date: 05/30/2017 CLINICAL DATA:  Hypoxia EXAM: PORTABLE CHEST 1 VIEW COMPARISON:  May 29, 2017 FINDINGS: Endotracheal tube tip is 2.9 cm above the carina. Central catheter tip is in the superior vena cava near the cavoatrial junction. Feeding tube extends below the diaphragm. Pacemaker lead is attached the right ventricle. Left ventricular assist device is unchanged in position. No pneumothorax. Cardiomegaly is stable. There is no appreciable edema or consolidation. Pulmonary vascularity is within normal limits. No adenopathy. No evident bone lesions. IMPRESSION: Tube and catheter positions as described without evident pneumothorax. Stable cardiomegaly. No edema or consolidation. Electronically Signed   By: Lowella Grip III M.D.   On: 05/30/2017 08:07   Dg Chest Port 1 View  Result Date: 05/29/2017 CLINICAL DATA:  Endotracheal tube, LVAD EXAM: PORTABLE CHEST 1 VIEW COMPARISON:  05/28/2017 FINDINGS: Endotracheal tube has tip 2.2 cm above the carina.  Right-sided PICC line has been advanced slightly with tip just below the cavoatrial junction. LVAD unchanged. Left-sided pacemaker unchanged. Enteric tube courses into the stomach as tip is not visualized. Lungs are hypoinflated with mild prominence of the perihilar markings suggesting mild vascular congestion. No definite effusion or pneumothorax. Mild stable cardiomegaly. Remainder of the exam is unchanged. IMPRESSION: Mild stable cardiomegaly with minimal vascular congestion. Multiple tubes and lines as described. Note that there has been mild interval advancement of the right-sided PICC line with tip just below the cavoatrial junction. Electronically Signed   By: Marin Olp M.D.   On: 05/29/2017  08:18   Dg Chest Port 1 View  Result Date: 05/28/2017 CLINICAL DATA:  Hypoxia EXAM: PORTABLE CHEST 1 VIEW COMPARISON:  Study obtained earlier in the day FINDINGS: Endotracheal tube tip is 3.4 cm above the carina. Orogastric tube tip and side port are in the stomach. Central catheter tip is in the superior vena cava. Pacemaker tip is in the right ventricle region. There is a left ventricular assist device in place. No pneumothorax. There is slight left base atelectasis. Lungs elsewhere clear. Heart is borderline enlarged, stable. The pulmonary vascularity is normal. There is aortic atherosclerosis. No adenopathy. There is postoperative change in the right shoulder. IMPRESSION: Tube and catheter positions as described without pneumothorax. Slight left base atelectasis. No edema or consolidation. Stable cardiac silhouette. There is aortic atherosclerosis. Aortic Atherosclerosis (ICD10-I70.0). Electronically Signed   By: Lowella Grip III M.D.   On: 05/28/2017 10:17   Dg Chest Port 1 View  Result Date: 05/28/2017 CLINICAL DATA:  Fever EXAM: PORTABLE CHEST 1 VIEW COMPARISON:  05/27/2017 FINDINGS: Hypoventilation with decreased lung volume. Progression of left lower lobe airspace disease. No heart failure or  effusion. NG tube enters the stomach. Right arm PICC tip in the SVC unchanged IMPRESSION: Hypoventilation. Progression of left lower lobe atelectasis/infiltrate. Electronically Signed   By: Franchot Gallo M.D.   On: 05/28/2017 09:16   Dg Abd Portable 1v  Result Date: 05/28/2017 CLINICAL DATA:  Feeding tube placement EXAM: PORTABLE ABDOMEN - 1 VIEW COMPARISON:  05/28/2017 1200 hour FINDINGS: The feeding tube follows the course of the stomach, duodenal bulb and its tip projects over the region of the proximal jejunum. A hiatal hernia is noted. Mildly distended small and large bowel loops are unchanged. LVAD device projects over the cardiac apex. AICD device projects over the right ventricle. IMPRESSION: Tip of the feeding tube projects over the proximal jejunum. Electronically Signed   By: Marybelle Killings M.D.   On: 05/28/2017 17:05   Dg Abd Portable 1v  Result Date: 05/28/2017 CLINICAL DATA:  Encounter for feeding tube placement. EXAM: PORTABLE ABDOMEN - 1 VIEW COMPARISON:  05/28/2017 FINDINGS: Feeding tube has been placed, its course looped within a hiatal hernia and tip within the region of the distal stomach. Central line, pacer lines and left ventricular assist device appears stable. Bowel gas pattern is nonobstructive. IMPRESSION: Feeding tube tip to the distal stomach, coarse looped within a hiatal hernia. Electronically Signed   By: Nolon Nations M.D.   On: 05/28/2017 12:44   Dg Abd Portable 1v  Result Date: 05/28/2017 CLINICAL DATA:  NG tube placement. EXAM: PORTABLE ABDOMEN - 1 VIEW COMPARISON:  Abdominal x-ray dated yesterday. FINDINGS: Enteric tube seen with the tip in the gastric fundus, similar to prior study. LVAD and pacer lead again noted. Air-filled, distended large and small bowel is again seen. Multiple surgical clips noted throughout the abdomen. No radio-opaque calculi or other significant radiographic abnormality are seen. IMPRESSION: 1. Nasogastric tube with tip projecting over the  gastric fundus. 2. Unchanged diffuse gaseous distention of bowel, suggestive of ileus. Electronically Signed   By: Titus Dubin M.D.   On: 05/28/2017 10:15     Medications:     Scheduled Medications: . amiodarone  200 mg Oral BID  . chlorhexidine gluconate (MEDLINE KIT)  15 mL Mouth Rinse BID  . Chlorhexidine Gluconate Cloth  6 each Topical Q0600  . collagenase   Topical Daily  . febuxostat  120 mg Oral Daily  . insulin aspart  0-15 Units Subcutaneous Q4H  .  levothyroxine  50 mcg Per NG tube QAC breakfast  . magnesium oxide  400 mg Oral Daily  . mouth rinse  15 mL Mouth Rinse 10 times per day  . mexiletine  300 mg Oral Q12H  . mirtazapine  15 mg Oral QHS  . pantoprazole sodium  40 mg Per Tube BID  . sodium chloride flush  10-40 mL Intracatheter Q12H  . sotalol  80 mg Oral Daily    Infusions: . sodium chloride Stopped (05/02/17 0630)  . sodium chloride 20 mL/hr at 05/30/17 0200  . sodium chloride    . feeding supplement (GLUCERNA 1.2 CAL) 1,000 mL (05/30/17 0400)  . meropenem (MERREM) IV Stopped (05/29/17 2224)  . norepinephrine (LEVOPHED) Adult infusion    . vancomycin Stopped (05/29/17 1130)    PRN Medications: acetaminophen (TYLENOL) oral liquid 160 mg/5 mL, albuterol, docusate, hydrALAZINE, neomycin-bacitracin-polymyxin, ondansetron (ZOFRAN) IV, oxyCODONE, phenol, sodium chloride flush, traMADol   Patient Profile   56 yo with CAD s/p CABG, ischemic cardiomyopathy/chronic systolic CHF, tophaceous gout, and CKD stage 3 was admitted for diuresis and consideration for LVAD placement. S/p HVAD on 6/28  Assessment/Plan:    1. Acute/chronic systolic CHF s/p HVAD: Ischemic cardiomyopathy.  St Jude ICD.  Echo (6/18) with EF 15%, mildly dilated RV with moderately decreased systolic function.  s/p HVAD placement 6/28 and had to return to OR to evacuate mediastinal hematoma.  He had been weaned off pressors/milrinone, but developed ileus w/ likely aspiration event 7/4,  re-intubated, developed afib/RVR requiring norepinephrine. Extubated 7/6.  VAD speed turned down to 2660 with VT. VAD parameters currently look good.  Milrinone stopped 7/17 early am with recurrent VT.  Now off norepinephrine.   Today  CO-OX 85%.  - CVP 10-11. Maps low so started on norepi 2 mcg.  - Hold off on HTN meds. - Off ASA with GI Bleed  - Goal 2-2.5. Todays INR 3.15. Has not had coumadin since 8.7  - Has been off dig/arb with elevated creatinine.     2. AKI on CKD stage 3: Suspect a component of peri-op ATN, worsened with ileus, vomiting, intubation/sedation. Creatinine continues to trend up 2.1>2.5>2.6  3. Symptomatic anemia due to acute UGI bleeding:  Received 3 units PRBCs 7/16 and 3 units PRBCs on 04/28/2017. 1 unit PRBCs 7/20. 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  Requiring 2 clips + epi + APC. Does not appear to be bleeding actively now.  - Got Feraheme 7/28.  - Continue Protonix 40 po bid. - Off aspirin for now. Dosing per pharm. INR 3.15 - Transfuse hgb < 8  -On 8/6 received 1UPRBCs.  -Todays hgb 8.5. No obvious source of bleeding.  4. Ventricular tachycardia: Status post ventricular tachycardia 2 on 7/13. Possible suction event. VAD speed turned down to 2700.  Recurrent VT 7/17. EP called to bedside. Overdrive pacing successful for about 10 minutes but VT recurred.  Milrinone turned off, remained in VT overnight but hemodynamically stable.  Speed decreased to 2660.  On 7/18, we were able to pace him out of VT.  Lidocaine added then stopped due to confusion/mental status changes.  He had VT 7/22 about 30 minutes after getting IV Lasix, had to be paced out again => may have been due to rapid fluid shift.  Pt paced out of VT 3 separate occasions 7/25. Got amio bolus x 2 and started back on IV infusion.  Transitioned to amiodarone 400 mg tid.  On 8/1 had recurrent VT  and paced out again.  Recurrent VT 8/2 per EP sotalol started along with  amiodarone, sotalol decreased to once daily with bradycardia.   - No VT over night. Continue mexiletine to 300 mg BID, sotalol 80 mg daily, amio 200 mg BID .     5. CAD: s/p CABG 2012. No S/S ischemia.  Off aspirin due to GI bleeding. No change.   6. Gout: Severe tophaceous gout. Left shoulder pain ?gout.   - Completed 3 days of prednisone.   - Continue uloric. NO change.  7. Atrial fibrillation: Developed atrial fibrillation with RVR in setting of aspiration PNA on 7/4.    Hard to know with interference from [place]. Appears NSR.  8. Malnutrition: Now off TPN. Per Speech- FEEs performed. Prealbumin very low.   Continue tube feeds. Nutrition following.  9. Aspiration PNA with acute hypoxemic respiratory failure on 7/4: He was extubated on 7/6. Tracheal aspirate with Klebsiella and citrobacter. Both sensitive to Zosyn. Completed abx 7/13.  Possible aspiration 8/8.   On meropenum.  WBC up to 23.7  10. Ileus: Resolved.  11. Deconditioning:  Plan for CIR when stable. OT/PT appreciated. CIR on hold. Reconsult once extubated.   12. Acute Respiratory Failure: Intubated 7/17 in setting of complex GI intervention. Extubated 7/18. Re intubated 8/8 .  13. Delirium: Resolved. No further. Appreciate neuro input Possibly related to lidocaine.  Lidocaine stopped. CT of head negative. EEG ok. No further workup per neuro.  Neuro  reconsulted 8/7 with AMS. Still febrile overnight. ABX broadened. -  CT head 05/27/17 with lacunar infarcts but do not appear acute.   -Currently intubated. Neuro recommendations appreciated.  15. Insomnia: On remeron for sleep and depression. 16. Post-op depression: Remeron started 7/28.  17. UTI - on vancomycin and meropenum. Urine culture- NGTD.  18. Unstageable Pressure Ulcer- Sacrum. Consult WOC Needs to start santyl daily to enzymatically debride. . Reposition R to L . WOC recommendations appreciated.      I reviewed the HVAD parameters from today, and compared the results to the  patient's prior recorded data.  No programming changes were made.  The HVAD is functioning within specified parameters.    Darrick Grinder, NP 05/30/2017, 8:38 AM  VAD Team --- VAD ISSUES ONLY--- Pager [phone] (7am - 7am) Advanced Heart Failure Team  Pager [phone] (M-F; 7a - 4p)  Please contact [facility] for night-coverage after hours (4p -7a ) and weekends on [email or web address]  Agree with above.  This am developed episodes of sustained bradycardia into 40-50 range with associated hypotension. Norepi started and back-up pacing rate turned to VVI 70. Norepi now at 2. MAPs 70s.   Remains on vent but is awake and alert.   Remains on meropenem for aspiration PNA and UTI. Still with low-grade fevers. WBC climbing.   Has large sacral decub. WOC following appreciate recs. VAD parameters stable   On exam Ill appearing  On vent Cor LVAD hum Lungs coarse Ab soft NT/ND  Ext 3+ edema +diffuse gouty changes  Remains critically ill. Mental status and respiratory status improving. But continues with low grade temps. Now requiring low-dose vasopressors. Renal function up a bit but stable. Tolerating TFs via Cor-Trak. VAD parameters ok. Sacral ulcer a major concern. Will continue aggressive care   CRITICAL CARE Performed by: Glori Bickers  Total critical care time: 40 minutes  Critical care time was exclusive of separately billable procedures and treating other patients.  Critical care was necessary to treat or prevent imminent or life-threatening deterioration.  Critical care was time spent personally by me (independent of midlevel providers or residents) on the following activities: development of treatment plan with patient and/or surrogate as well as nursing, discussions with consultants, evaluation of patient's response to treatment, examination of patient, obtaining history from patient or surrogate, ordering and performing treatments and interventions, ordering and review of laboratory  studies, ordering and review of radiographic studies, pulse oximetry and re-evaluation of patient's condition.   Glori Bickers, MD  9:33 AM

## 2017-05-30 NOTE — Progress Notes (Signed)
PULMONARY / CRITICAL CARE MEDICINE   Name: James [name] MRN: [id number] DOB: 1961-06-25 James [name]   ADMISSION DATE:  04/18/2017 CONSULTATION DATE:  04/23/2017  REFERRING MD:  Laneta SimmersBartle - CVTS  CHIEF COMPLAINT:  Acute respiratory failure  BRIEF SUMMARY:   [name] with CHF (EF 15%), AICD, CAD, Gout, HTN, CKD, DM, Anemia, admitted 04/05/2017 for VAD placement, as he had already been turned down by [facility] for transplant due to his gout and limitations with his mobility. Now s/p HVAD placement for his ischemic cardiomyopathy. His course has been complicated by AKI-on-CKD, and also complicated by pre-op Afib with RVR. Developed acute hypoxic respiratory failure requiring 15L O2 NRB, and he developed worsening respiratory distress, prompting ICU consult.  He was intubated 7/5 for respiratory arrest, aspiration PNA > developed septic shock / required vasopressors.   Extubated 7/6.  He was reintubated again on 7/17 when he decompensated during EGD. He was extubated on 7/18.  Hospital course complicated by recurrent episodes of SVT, SVT, GI bleed. On the morning of 8/8 he became more somnolent with altered mental status, increased work of breathing. NG tube was noted to be displaced with likely aspiration. PCCM consulted for help with management.    CULTURES: Blood 7/5 >> neg Sputum 7/5 >>klebsieall, citrobacter Sputum 7/7 >> klebsiella - resistant to amp otherwise sens [facility] >> UC 8/7 >> Sputum 8/8 >>  ANTIBIOTICS: Vancomycin 7/5 >> 7/7; 8/8 >> Zosyn 7/5 >> 7/13 Meropenem 8/8 >>  LINES/TUBES: ETT 7/5 >> 7/6, 7/17-7/18 Double lumen PICC on the right 7/2 >>   SIGNIFICANT EVENTS: 7/05  Respiratory arrest post feculent vomitus episode 7/06  Extubated  7/17 Intubated during EGD 7/18 Extubated 8/6 Resp distress, aspirated. Intubated 8/8 - intbuated 8/9 - Febrile overnight, Tmax 102 Urinary retention -> to obtain foley cortrak placed- TF restarted    SUBJECTIVE/OVERNIGHT/INTERVAL HX 05/30/17 - febrile ?  Coming down v swinging - wbc up at 25K. On PRVC vent. Not on sedation Per RN has unstageable sacral decub and also venous ulcer in Lower extremity. This AM HR droppe to 40s and PM increasd to backup rate 70 and now coming off levophed. On TF. Creat worsening  VITAL SIGNS: BP (!) 73/57 (BP Location: Left Arm)   Pulse 61   Temp 99.3 F (37.4 C) (Axillary)   Resp 18   Ht 6' (1.829 m)   Wt 98.7 kg (217 lb 9.5 oz)   SpO2 100%   BMI 29.51 kg/m   HEMODYNAMICS: CVP:  [5 mmHg-7 mmHg] 7 mmHg  VENTILATOR SETTINGS: Vent Mode: PRVC FiO2 (%):  [40 %] 40 % Set Rate:  [14 bmp] 14 bmp Vt Set:  [620 mL] 620 mL PEEP:  [5 cmH20] 5 cmH20 Plateau Pressure:  [14 cmH20-16 cmH20] 15 cmH20  INTAKE / OUTPUT: I/O last 3 completed shifts: In: 1531.1 [I.V.:409.8; NG/GT:771.2; IV Piggyback:350] Out: 1560 [Urine:1560]  PHYSICAL EXAMINATION:  General Appearance:    Looks criticall ill . Thin,   Head:    Normocephalic, without obvious abnormality, atraumatic  Eyes:    PERRL - yes, conjunctiva/corneas - clear      Ears:    Normal external ear canals, both ears  Nose:   NG tube - panda +  Throat:  ETT TUBE - YES , OG tube - no  Neck:   Supple,  No enlargement/tenderness/nodules     Lungs:     Clear to auscultation bilaterally, Ventilator   Synchrony - yes  Chest wall:    No deformity  Heart:    S1 and S2 normal, no murmur, CVP - no.  Pressors - levophed at  Abdomen:     Soft, no masses, no organomegaly  Genitalia:    Not done  Rectal:   not done  Extremities:   Extremities- gout deformities on fingers + , lowers in boot. Lowers with edema +     Skin:   Intact in exposed areas . Sacral area - per RN decub unstageable +     Neurologic:   Sedation - none -> RASS - 0 to -1 . *      LABS: PULMONARY  Recent Labs Lab 05/27/17 0434 05/28/17 0515 05/28/17 1132 05/29/17 0248 05/30/17 0320  PHART  --   --  7.352  --   --   PCO2ART  --   --  37.6  --   --   PO2ART  --   --  360.0*  --   --    HCO3  --   --  20.8  --   --   TCO2  --   --  22  --   --   O2SAT 96.1 60.0 100.0 84.9 84.6    CBC  Recent Labs Lab 05/28/17 0503 05/29/17 0216 05/30/17 0310  HGB 9.6* 8.5* 8.5*  HCT 30.2* 26.2* 26.4*  WBC 16.5* 17.0* 23.7*  PLT 300 243 250    COAGULATION  Recent Labs Lab 05/26/17 0413 05/27/17 0419 05/28/17 0503 05/29/17 0216 05/30/17 0310  INR 2.05 1.93 2.09 2.90 3.15    CARDIAC  No results for input(s): TROPONINI in the last 168 hours. No results for input(s): PROBNP in the last 168 hours.   CHEMISTRY  Recent Labs Lab 05/25/17 0347 05/26/17 0413 05/27/17 0419 05/28/17 0503 05/29/17 0216 05/30/17 0310  NA 135 137 137 137 140 141  K 3.6 4.2 4.2 4.4 4.3 4.1  CL 106 108 106 108 111 112*  CO2 23 22 22  20* 19* 20*  GLUCOSE 135* 150* 145* 111* 104* 135*  BUN 44* 45* 47* 47* 51* 55*  CREATININE 1.73* 1.98* 2.04* 2.16* 2.52* 2.59*  CALCIUM 7.8* 7.7* 8.0* 7.9* 7.6* 7.6*  MG 1.7 2.0 1.9 2.1 2.0  --    Estimated Creatinine Clearance: 39.7 mL/min (A) (by C-G formula based on SCr of 2.59 mg/dL (H)).   LIVER  Recent Labs Lab 05/26/17 0413 05/27/17 0419 05/28/17 0503 05/29/17 0216 05/30/17 0310  INR 2.05 1.93 2.09 2.90 3.15     INFECTIOUS  Recent Labs Lab 05/28/17 1017 05/29/17 0216 05/29/17 0230 05/30/17 0310  LATICACIDVEN  --   --  1.1  --   PROCALCITON 0.99 1.49  --  1.33     ENDOCRINE CBG (last 3)   Recent Labs  05/29/17 2348 05/30/17 0349 05/30/17 0756  GLUCAP 122* 125* 114*         IMAGING x48h  - image(s) personally visualized  -   highlighted in bold Dg Chest Port 1 View  Result Date: 05/30/2017 CLINICAL DATA:  Hypoxia EXAM: PORTABLE CHEST 1 VIEW COMPARISON:  May 29, 2017 FINDINGS: Endotracheal tube tip is 2.9 cm above the carina. Central catheter tip is in the superior vena cava near the cavoatrial junction. Feeding tube extends below the diaphragm. Pacemaker lead is attached the right ventricle. Left ventricular  assist device is unchanged in position. No pneumothorax. Cardiomegaly is stable. There is no appreciable edema or consolidation. Pulmonary vascularity is within normal limits. No adenopathy. No evident bone lesions. IMPRESSION: Tube and  catheter positions as described without evident pneumothorax. Stable cardiomegaly. No edema or consolidation. Electronically Signed   By: Bretta Bang III M.D.   On: 05/30/2017 08:07   Dg Chest Port 1 View  Result Date: 05/29/2017 CLINICAL DATA:  Endotracheal tube, LVAD EXAM: PORTABLE CHEST 1 VIEW COMPARISON:  05/28/2017 FINDINGS: Endotracheal tube has tip 2.2 cm above the carina. Right-sided PICC line has been advanced slightly with tip just below the cavoatrial junction. LVAD unchanged. Left-sided pacemaker unchanged. Enteric tube courses into the stomach as tip is not visualized. Lungs are hypoinflated with mild prominence of the perihilar markings suggesting mild vascular congestion. No definite effusion or pneumothorax. Mild stable cardiomegaly. Remainder of the exam is unchanged. IMPRESSION: Mild stable cardiomegaly with minimal vascular congestion. Multiple tubes and lines as described. Note that there has been mild interval advancement of the right-sided PICC line with tip just below the cavoatrial junction. Electronically Signed   By: Elberta Fortis M.D.   On: 05/29/2017 08:18   Dg Chest Port 1 View  Result Date: 05/28/2017 CLINICAL DATA:  Hypoxia EXAM: PORTABLE CHEST 1 VIEW COMPARISON:  Study obtained earlier in the day FINDINGS: Endotracheal tube tip is 3.4 cm above the carina. Orogastric tube tip and side port are in the stomach. Central catheter tip is in the superior vena cava. Pacemaker tip is in the right ventricle region. There is a left ventricular assist device in place. No pneumothorax. There is slight left base atelectasis. Lungs elsewhere clear. Heart is borderline enlarged, stable. The pulmonary vascularity is normal. There is aortic atherosclerosis.  No adenopathy. There is postoperative change in the right shoulder. IMPRESSION: Tube and catheter positions as described without pneumothorax. Slight left base atelectasis. No edema or consolidation. Stable cardiac silhouette. There is aortic atherosclerosis. Aortic Atherosclerosis (ICD10-I70.0). Electronically Signed   By: Bretta Bang III M.D.   On: 05/28/2017 10:17   Dg Chest Port 1 View  Result Date: 05/28/2017 CLINICAL DATA:  Fever EXAM: PORTABLE CHEST 1 VIEW COMPARISON:  05/27/2017 FINDINGS: Hypoventilation with decreased lung volume. Progression of left lower lobe airspace disease. No heart failure or effusion. NG tube enters the stomach. Right arm PICC tip in the SVC unchanged IMPRESSION: Hypoventilation. Progression of left lower lobe atelectasis/infiltrate. Electronically Signed   By: Marlan Palau M.D.   On: 05/28/2017 09:16   Dg Abd Portable 1v  Result Date: 05/28/2017 CLINICAL DATA:  Feeding tube placement EXAM: PORTABLE ABDOMEN - 1 VIEW COMPARISON:  05/28/2017 1200 hour FINDINGS: The feeding tube follows the course of the stomach, duodenal bulb and its tip projects over the region of the proximal jejunum. A hiatal hernia is noted. Mildly distended small and large bowel loops are unchanged. LVAD device projects over the cardiac apex. AICD device projects over the right ventricle. IMPRESSION: Tip of the feeding tube projects over the proximal jejunum. Electronically Signed   By: Jolaine Click M.D.   On: 05/28/2017 17:05   Dg Abd Portable 1v  Result Date: 05/28/2017 CLINICAL DATA:  Encounter for feeding tube placement. EXAM: PORTABLE ABDOMEN - 1 VIEW COMPARISON:  05/28/2017 FINDINGS: Feeding tube has been placed, its course looped within a hiatal hernia and tip within the region of the distal stomach. Central line, pacer lines and left ventricular assist device appears stable. Bowel gas pattern is nonobstructive. IMPRESSION: Feeding tube tip to the distal stomach, coarse looped within a  hiatal hernia. Electronically Signed   By: Norva Pavlov M.D.   On: 05/28/2017 12:44   Dg Abd Portable 1v  Result Date: 05/28/2017 CLINICAL DATA:  NG tube placement. EXAM: PORTABLE ABDOMEN - 1 VIEW COMPARISON:  Abdominal x-ray dated yesterday. FINDINGS: Enteric tube seen with the tip in the gastric fundus, similar to prior study. LVAD and pacer lead again noted. Air-filled, distended large and small bowel is again seen. Multiple surgical clips noted throughout the abdomen. No radio-opaque calculi or other significant radiographic abnormality are seen. IMPRESSION: 1. Nasogastric tube with tip projecting over the gastric fundus. 2. Unchanged diffuse gaseous distention of bowel, suggestive of ileus. Electronically Signed   By: Obie Dredge M.D.   On: 05/28/2017 10:15      ASSESSMENT / PLAN: [name] with CHF (EF 15%), AICD, CAD, Gout, HTN, CKD, DM, Anemia, admitted 04/04/2017 for VAD placement, now s/p HVAD placement, with course complicated by AKI-on-CKD and pre-op Afib with RVR, GI bleed recurrent arrhythmia.  Decompensated on 8/8 requiring intubation.  PULMONARY A: Acute hypoxic respiratory failure requiring intubation 8/8- likely secondary to aspiration of tube feeds  Protein calorie malnutrition  -  05/30/2017 - > does NOT meet criteria for SBT/Extubation in setting of Acute Respiratory Failure due to circulatory shock, sepsis, AKI and debility and cardiac failure Nos   P: Full vent support No extubation 05/30/2017 Might need trach v comfort given repeated intubtaion and debility  Acute on Chronic Systolic CHF / ICM - [employer] Jude ICD, turned down for transplant S/p HVAD Atrial Fibrillation  Reccurent VT Mediastinal Hematoma s/p Evacuation post LVAD CHF (EF 15%); CAD; History HTN AKI on CKD stage 3 - TSH 9.45 on 8/7 P: Per CVTS and Heart failure team Check T4 free, will start synthroid 50 mcg per tube daily Recheck TSH in 6 weeks from 05/30/2017    No family at bedside   The  patient is critically ill with multiple organ systems failure and requires high complexity decision making for assessment and support, frequent evaluation and titration of therapies, application of advanced monitoring technologies and extensive interpretation of multiple databases.   Critical Care Time devoted to patient care services described in this note is  30  Minutes. This time reflects time of care of this signee Dr Kalman Shan. This critical care time does not reflect procedure time, or teaching time or supervisory time of PA/NP/Med student/Med Resident etc but could involve care discussion time    Dr. Kalman Shan, M.D., [facility].C.P Pulmonary and Critical Care Medicine Staff Physician [facility] [place] [facility] Pager: [phone], If no answer or between  15:00h - 7:00h: call [phone]  05/30/2017 8:33 AM

## 2017-05-30 NOTE — Progress Notes (Signed)
Subjective: [name] was in bed, intubated, responsive to voice and simple commands on my visit. No reported events overnight.  Physical Examination: Vitals:   05/30/17 0600 05/30/17 0801  BP: (!) 73/57   Pulse: 61   Resp: 18   Temp:  99.3 F (37.4 C)  SpO2: 100%     General: WDWN male. Intubated. HEENT:  Normocephalic, no lesions, without obvious abnormality.  Normal external eye and conjunctiva.  Normal external ears. Normal external nose, mucus membranes and septum.  Normal pharynx. Pulmonary: Ventilated    Assessment and recommendations per attending neurologist.  James PotterJamie Aldridge PA-C [facility] [phone]  Neurological Examination:  CN: Pupils are equal, round and symmetrically reactive. He has dysconjugate gaze that appears to conjugate with increasing arousal. VFF. Motor: He moves all extremities with good strength, limited in LE due to edema.     05/30/2017, 8:33 AM   Assessment: 56 year old male with recurrent encephalopathy. His exam appeared most consistent with a toxic/metabolic encephalopathy and he had a urinalysis which could be consistent with UTI. Especially if he is actually hypothyroid, it could be that he became encephalopathic because of the urinary tract infection, with subsequent worsening due to an aspiration event. He appears better today than yesterday. With a negative EEG, I have very low suspicion for seizures.  It would be reasonable to repeat head CT today, but if no change, then neurology will sign off.   Recommendations: 1) CT head - if no changes, neurology to sign off.   Ritta SlotMcNeill James Morais, MD [facility] [phone]  If 7pm- 7am, please page neurology on call as listed in AMION.

## 2017-05-30 NOTE — Progress Notes (Signed)
PULMONARY / CRITICAL CARE MEDICINE   Name: James FabianHarry D Greenblatt MRN: [id number] DOB: 1961/07/28    ADMISSION DATE:  03/22/2017 CONSULTATION DATE:  04/23/2017  REFERRING MD:  Laneta SimmersBartle - CVTS  CHIEF COMPLAINT:  Acute respiratory failure  BRIEF SUMMARY:   [name] with CHF (EF 15%), AICD, CAD, Gout, HTN, CKD, DM, Anemia, admitted 03/26/2017 for VAD placement, he had already been turned down by [facility] for transplant due to his gout and limitations with his mobility. Now s/p HVAD placement for his ischemic cardiomyopathy. His course has been complicated by AKI-on-CKD, and also complicated by pre-op Afib with RVR. Developed acute hypoxic respiratory failure requiring 15L O2 NRB, and he developed worsening respiratory distress, prompting ICU consult.  He was intubated 7/5 for respiratory arrest, aspiration PNA > developed septic shock / required vasopressors.  Extubated 7/6.  He was reintubated again on 7/17 when he decompensated during EGD. He was extubated on 7/18.  Hospital course complicated by recurrent episodes of SVT, SVT, GI bleed. On the morning of 8/8 he became more somnolent with altered mental status, increased work of breathing. NG tube was noted to be displaced with likely aspiration. PCCM consulted for help with management.  SUBJECTIVE:  No acute events overnight.  Pt remains on 4mcg levophed.  Failed SBT  VITAL SIGNS: BP (!) 74/64   Pulse 70   Temp 98.6 F (37 C) (Oral)   Resp 19   Ht 6' (1.829 m)   Wt 217 lb 9.5 oz (98.7 kg)   SpO2 100%   BMI 29.51 kg/m   HEMODYNAMICS: CVP:  [1 mmHg-17 mmHg] 1 mmHg  VENTILATOR SETTINGS: Vent Mode: PRVC FiO2 (%):  [40 %] 40 % Set Rate:  [14 bmp] 14 bmp Vt Set:  [620 mL] 620 mL PEEP:  [5 cmH20] 5 cmH20 Plateau Pressure:  [15 cmH20-18 cmH20] 16 cmH20  INTAKE / OUTPUT: I/O last 3 completed shifts: In: 3240.6 [I.V.:629.4; NG/GT:1746.2; IV Piggyback:865] Out: 1140 [Urine:1140]  PHYSICAL EXAMINATION: General: chronically ill appearing male in  NAD HEENT: MM pink/moist, ETT, NGT Neuro: awake, alert, nods appropriately, follows commands, generalized weakness  CV: VAD hum PULM: even/non-labored, lungs bilaterally clear JX:BJYNGI:soft, non-tender, bsx4 active  Extremities: warm/dry, generalized 1+ edema, swollen joints / gout noted Skin: no rashes or lesions  LABS:  BMET  Recent Labs Lab 05/29/17 0216 05/30/17 0310 05/30/17 1250  NA 140 141 141  K 4.3 4.1 4.4  CL 111 112* 113*  CO2 19* 20* 19*  BUN 51* 55* 59*  CREATININE 2.52* 2.59* 2.62*  GLUCOSE 104* 135* 166*   Electrolytes  Recent Labs Lab 05/27/17 0419 05/28/17 0503 05/29/17 0216 05/30/17 0310 05/30/17 1250  CALCIUM 8.0* 7.9* 7.6* 7.6* 7.7*  MG 1.9 2.1 2.0  --   --    CBC  Recent Labs Lab 05/28/17 0503 05/29/17 0216 05/30/17 0310  WBC 16.5* 17.0* 23.7*  HGB 9.6* 8.5* 8.5*  HCT 30.2* 26.2* 26.4*  PLT 300 243 250   Coag's  Recent Labs Lab 05/28/17 0503 05/29/17 0216 05/30/17 0310  INR 2.09 2.90 3.15   Sepsis Markers  Recent Labs Lab 05/28/17 1017 05/29/17 0216 05/29/17 0230 05/30/17 0310  LATICACIDVEN  --   --  1.1  --   PROCALCITON 0.99 1.49  --  1.33   ABG  Recent Labs Lab 05/28/17 1132  PHART 7.352  PCO2ART 37.6  PO2ART 360.0*   Liver Enzymes No results for input(s): AST, ALT, ALKPHOS, BILITOT, ALBUMIN in the last 168 hours.  Cardiac Enzymes No  results for input(s): TROPONINI, PROBNP in the last 168 hours.  Glucose  Recent Labs Lab 05/29/17 2348 05/30/17 0349 05/30/17 0756 05/30/17 1230 05/30/17 1621 05/30/17 2009  GLUCAP 122* 125* 114* 147* 147* 170*    Imaging Dg Chest Port 1 View  Result Date: 05/30/2017 CLINICAL DATA:  Hypoxia EXAM: PORTABLE CHEST 1 VIEW COMPARISON:  May 29, 2017 FINDINGS: Endotracheal tube tip is 2.9 cm above the carina. Central catheter tip is in the superior vena cava near the cavoatrial junction. Feeding tube extends below the diaphragm. Pacemaker lead is attached the right  ventricle. Left ventricular assist device is unchanged in position. No pneumothorax. Cardiomegaly is stable. There is no appreciable edema or consolidation. Pulmonary vascularity is within normal limits. No adenopathy. No evident bone lesions. IMPRESSION: Tube and catheter positions as described without evident pneumothorax. Stable cardiomegaly. No edema or consolidation. Electronically Signed   By: Bretta Bang III M.D.   On: 05/30/2017 08:07   STUDIES:  CT Head 8/7 >> small bilateral subdural hygromas or hematomas, but not significantly changed since July and clinical significance unclear.  Right superior cerebellar artery lacunar infarcts have become apparent since 05/08/2017, but have a chronic appearance today.  CULTURES: Blood 7/5 >> neg Sputum 7/5 >>klebsieall, citrobacter Sputum 7/7 >> klebsiella - resistant to amp otherwise sens [facility] 8/7 >> UC 8/7 >> negative  Sputum 8/8 >> multiple organisms, none predominant  ANTIBIOTICS: Vancomycin 7/5 >> 7/7; 8/8 >> Zosyn 7/5 >> 7/13 Meropenem 8/8 >>  SIGNIFICANT EVENTS: 7/05  Respiratory arrest post feculent vomitus episode 7/06  Extubated  7/17  Intubated during EGD 7/18  Extubated 8/06  Resp distress, aspirated. intubated  LINES/TUBES: ETT 7/5 >> 7/6, 7/17-7/18 Double lumen PICC on the right 7/2 >>  ASSESSMENT / PLAN: [name] with CHF (EF 15%), AICD, CAD, Gout, HTN, CKD, DM, Anemia, admitted 04/14/2017 for VAD placement, now s/p HVAD placement, with course complicated by AKI-on-CKD and pre-op Afib with RVR, GI bleed recurrent arrhythmia.  Decompensated in setting of aspiration on 8/8 requiring intubation.  PULMONARY A: Acute hypoxic respiratory failure requiring intubation 8/8 - likely secondary to aspiration of tube feeds  P: PRVC 8 cc/kg Daily SBT/WUA as able > Cardiology ok with extubation when meets criteria Trend CXR  PRN albuterol Continue abx as above, narrow as able Cultures as above  Acute on Chronic Systolic CHF /  ICM - [employer] ICD, turned down for transplant S/p HVAD Atrial Fibrillation - TSH 9.45 on 8/7 Reccurent VT Mediastinal Hematoma s/p Evacuation post LVAD CHF (EF 15%); CAD; History HTN AKI on CKD stage 3 P: ICU monitoring  Per CVTS / CHF Service  Continue synthroid Recheck TSH in 6 weeks (9/21)  Protein calorie malnutrition  P: TF per nutrition    CC Time: 30 mintues  Canary Brim, NP-C [place] [facility] Pgr: [phone] or if no answer [phone] 05/30/2017, 9:24 PM

## 2017-05-30 NOTE — Progress Notes (Signed)
Chaplain continuing to provide care to this family who are growing exhausted with the current medical situation for the family.  Prognosis hasn't turned out to be the way they were expecting it to be.  They knew going in that it was going to be a long process.  They were hoping for a better outcome.  Chaplain providing a listening ear, emotional support, and prayer for patient and spouse.  Chaplain would like to thank the medical team for their continual care for this patient and family.    Chaplain will remain available as needed.    05/30/17 1233  Clinical Encounter Type  Visited With Patient and family together  Visit Type Follow-up;Psychological support;Spiritual support;Social support;Critical Care  Referral From Physician  Consult/Referral To Chaplain  Recommendations (Continued support for family, long hospital stay)  Spiritual Encounters  Spiritual Needs Prayer;Emotional  Stress Factors  Patient Stress Factors Health changes;Exhausted  Family Stress Factors Major life changes;Health changes;Exhausted

## 2017-05-30 NOTE — Progress Notes (Signed)
PT Cancellation Note  Patient Details Name: Edd FabianHarry D Andreen MRN: [id number] DOB: 1960-10-23   Cancelled Treatment:    Reason Eval/Treat Not Completed: Patient not medically ready.  Very soft pressures on pressors. Intubated. 05/30/2017  Elliott BingKen Keirah Konitzer, PT [phone]  (pager)   Eliseo GumKenneth V Myangel Summons 05/30/2017, 2:29 PM

## 2017-05-30 NOTE — Progress Notes (Signed)
Patient ID: James [name], male   DOB: 09-Apr-1961, 56 y.o.   MRN: [id number] SICU Evening Rounds:  He remains labile on levophed 10 mcg up from 4 mcg this am. MAP is low at 60. CVP is 5. His Hgb was 8.5 this am so will give him a unit of PRBC's for volume and start albumin 25% every 6 hrs.  He has been oliguric today and creat up to 2.62.  BMET    Component Value Date/Time   NA 141 05/30/2017 1250   K 4.4 05/30/2017 1250   CL 113 (H) 05/30/2017 1250   CO2 19 (L) 05/30/2017 1250   GLUCOSE 166 (H) 05/30/2017 1250   BUN 59 (H) 05/30/2017 1250   CREATININE 2.62 (H) 05/30/2017 1250   CREATININE 1.89 (H) 10/19/2015 1438   CALCIUM 7.7 (L) 05/30/2017 1250   GFRNONAA 26 (L) 05/30/2017 1250   GFRAA 30 (L) 05/30/2017 1250   He was started on Depakote today by neurology for some seizure-like activity. Repeat head CT ordered. He is alert and following commands now. Wants ETT out.  He is on vanc and Merrem for infection of unclear source. All cultures negative so far. WBC is rising daily. May be due to sacral pressure ulcer. Chest incision looks good. CXR: clear Abdomen soft and nontender  Discussed status and plans with wife and family at bedside.

## 2017-05-30 NOTE — Progress Notes (Signed)
Notified Any Clegg re: UOP 40ml since 4am and increased need of levophed. Will come to bedsie.

## 2017-05-30 NOTE — Care Management Note (Addendum)
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  Discussed in LOS , appropriate for continued stay.   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    7/25 1014 Letha Capeeborah Sarissa Dern RN, BSN - patient is getting close to CIR, awaiting to see if will MD will clear for CIR. Patient back into VT in 120's, started on amio.   05/27/17- 1000- [name] RN, CM- pt has had multiple post op set backs/complications- including ileus, GIB, VT, and vol overload. CIR continues to follow for possible admission when medically stable- pt more lethargic today and not vocal- MD to consult Neurology. CM will continue to follow.   8/13 - Letha Capeeborah Isbella Arline RN, BSN - LVAD placed 6/28, reintubated 8/8 with possible asp pna, febrile, wbc rising, cortrak tube feeds will try to cont to wean in the next 2 days if fails then family to make decision for Trach or one way extubation.  Palliative following. conts on levophed, depacon, iv abx, Discussed in LOS , appropriate for continued stay.  8/15 1117 Letha Capeeborah Garl Speigner RN, BSN - per MD note  He weaned better yesterday on vent.  Will wean again today.  He would not want tracheostomy.  As long as we are making progress with vent wean, will continue towards extubation.  If he stops making progress, will need to discuss palliative care.                      Action/Plan:   Expected Discharge Date:                  Expected Discharge Plan:  IP Rehab  Facility  In-House Referral:  NA  Discharge planning Services  CM Consult  Post Acute Care Choice:    Choice offered to:     DME Arranged:    DME Agency:     HH Arranged:    HH Agency:     Status of Service:  In process, will continue to follow  If discussed at Long Length of Stay Meetings, dates discussed:    Additional Comments:  [name], Duan Scharnhorst Clinton, RN 05/30/2017, 1:42 PM

## 2017-05-30 NOTE — Progress Notes (Signed)
I was called to the bedside by nursing after he began exhibiting activity concerning for seizure. He apparently was twitching, predominantly in his face and became acutely unresponsive in the middle of conversation. By the time of my arrival, he had some subtle myoclonus, but no seizure activity. He continued to be less responsive, but during the course of my exam he rapidly improved and began following commands and became interactive. On arrival, his eyes were disconjugate, but they conjugated with increasing alertness.   I am still not certain that this represented seizure activity, but my index of suspicion is high enough at this time but I would favor treating with IV Depakote.  1) Depacon 1500 mg 1 then 500 mg 3 times a day 2) CT head  3) neurology will continue to follow  Ritta SlotMcNeill Koree Staheli, MD [facility] [phone]  If 7pm- 7am, please page neurology on call as listed in AMION.

## 2017-05-30 NOTE — Progress Notes (Signed)
Paged Dr. Gala RomneyBensimhon in regards to patient Sinus brady in the 50's and decrease in MAP. Orders given and he will come to bedside

## 2017-05-30 NOTE — Progress Notes (Signed)
OT Cancellation Note  Patient Details Name: James [name] MRN: [id number] DOB: 11/17/1960   Cancelled Treatment:    Reason Eval/Treat Not Completed: Medical issues which prohibited therapy.  Will check back next week.  [name] Ashawayonarpe, OTR/L [phone]   [name], Khadir Roam M 05/30/2017, 12:03 PM

## 2017-05-31 ENCOUNTER — Inpatient Hospital Stay (HOSPITAL_COMMUNITY): Payer: Medicare PPO

## 2017-05-31 LAB — CBC
HCT: 30.5 % — ABNORMAL LOW (ref 39.0–52.0)
Hemoglobin: 10 g/dL — ABNORMAL LOW (ref 13.0–17.0)
MCH: 28.9 pg (ref 26.0–34.0)
MCHC: 32.8 g/dL (ref 30.0–36.0)
MCV: 88.2 fL (ref 78.0–100.0)
Platelets: 305 10*3/uL (ref 150–400)
RBC: 3.46 MIL/uL — ABNORMAL LOW (ref 4.22–5.81)
RDW: 18 % — ABNORMAL HIGH (ref 11.5–15.5)
WBC: 26.7 10*3/uL — ABNORMAL HIGH (ref 4.0–10.5)

## 2017-05-31 LAB — TYPE AND SCREEN
ABO/RH(D): A POS
Antibody Screen: NEGATIVE
Unit division: 0

## 2017-05-31 LAB — PROTIME-INR
INR: 2.74
Prothrombin Time: 29.6 s — ABNORMAL HIGH (ref 11.4–15.2)

## 2017-05-31 LAB — BASIC METABOLIC PANEL WITH GFR
Anion gap: 9 (ref 5–15)
BUN: 59 mg/dL — ABNORMAL HIGH (ref 6–20)
CO2: 21 mmol/L — ABNORMAL LOW (ref 22–32)
Calcium: 7.8 mg/dL — ABNORMAL LOW (ref 8.9–10.3)
Chloride: 113 mmol/L — ABNORMAL HIGH (ref 101–111)
Creatinine, Ser: 2.47 mg/dL — ABNORMAL HIGH (ref 0.61–1.24)
GFR calc Af Amer: 32 mL/min — ABNORMAL LOW
GFR calc non Af Amer: 28 mL/min — ABNORMAL LOW
Glucose, Bld: 210 mg/dL — ABNORMAL HIGH (ref 65–99)
Potassium: 4.2 mmol/L (ref 3.5–5.1)
Sodium: 143 mmol/L (ref 135–145)

## 2017-05-31 LAB — GLUCOSE, CAPILLARY
GLUCOSE-CAPILLARY: 196 mg/dL — AB (ref 65–99)
GLUCOSE-CAPILLARY: 185 mg/dL — AB (ref 65–99)
GLUCOSE-CAPILLARY: 162 mg/dL — AB (ref 65–99)
Glucose-Capillary: 170 mg/dL — ABNORMAL HIGH (ref 65–99)
Glucose-Capillary: 207 mg/dL — ABNORMAL HIGH (ref 65–99)

## 2017-05-31 LAB — BPAM RBC
Blood Product Expiration Date: 201808292359
ISSUE DATE / TIME: 201808102052
Unit Type and Rh: 6200

## 2017-05-31 LAB — CULTURE, BLOOD (ROUTINE X 2)

## 2017-05-31 LAB — LACTATE DEHYDROGENASE: LDH: 156 U/L (ref 98–192)

## 2017-05-31 LAB — MAGNESIUM: Magnesium: 2 mg/dL (ref 1.7–2.4)

## 2017-05-31 LAB — COOXEMETRY PANEL
CARBOXYHEMOGLOBIN: 1 % (ref 0.5–1.5)
Methemoglobin: 1.4 % (ref 0.0–1.5)
O2 SAT: 74.2 %
TOTAL HEMOGLOBIN: 10.3 g/dL — AB (ref 12.0–16.0)

## 2017-05-31 MED ORDER — DEXTROSE 5 % IV SOLN
500.0000 mg | Freq: Two times a day (BID) | INTRAVENOUS | Status: DC
  Administered 2017-05-31 – 2017-06-03 (×6): 500 mg via INTRAVENOUS
  Filled 2017-05-31 (×6): qty 5

## 2017-05-31 NOTE — Progress Notes (Signed)
Patient ID: James [name], male   DOB: 05-17-1961, 56 y.o.   MRN: [id number] HVAD Rounding Note  Subjective:    Remains intubated. Levophed increased from 4 mcg this am to 10 mcg this evening to support MAP. CVP 5, Co-ox 74% this am.  Spiked fever to 101.4 this am and more blood cultures sent   LVAD INTERROGATION:  HVAD:  Flow 4.8 liters/min, speed 2600, power 4W,   Objective:    Vital Signs:   Temp:  [98.2 F (36.8 C)-101.4 F (38.6 C)] 98.2 F (36.8 C) (08/11 1745) Pulse Rate:  [47-92] 70 (08/11 1830) Resp:  [16-34] 22 (08/11 1830) BP: (67-128)/(40-92) 86/68 (08/11 1830) SpO2:  [95 %-100 %] 100 % (08/11 1830) FiO2 (%):  [40 %] 40 % (08/11 1605) Weight:  [101.4 kg (223 lb 8.7 oz)] 101.4 kg (223 lb 8.7 oz) (08/11 0500) Last BM Date: 05/31/17 Mean arterial Pressure 75-85 on levophed 10 mcg  Intake/Output:   Intake/Output Summary (Last 24 hours) at 05/31/17 1856 Last data filed at 05/31/17 1800  Gross per 24 hour  Intake          3312.64 ml  Output             1043 ml  Net          2269.64 ml     Physical Exam: General: Lethargic and chronically ill appearing HEENT: intubated with feeding tube Cor: distant heart sounds with LVAD hum present. Lungs: coarse Abdomen: soft, nontender, nondistended. Good bowel sounds. Extremities: marked edema in both lower legs that has been chronic Neuro: intubated and lethargic but follows commands  Telemetry: v-paced 70  Labs: Basic Metabolic Panel:  Recent Labs Lab 05/26/17 0413 05/27/17 0419 05/28/17 0503 05/29/17 0216 05/30/17 0310 05/30/17 1250 05/31/17 0322  NA 137 137 137 140 141 141 143  K 4.2 4.2 4.4 4.3 4.1 4.4 4.2  CL 108 106 108 111 112* 113* 113*  CO2 22 22 20* 19* 20* 19* 21*  GLUCOSE 150* 145* 111* 104* 135* 166* 210*  BUN 45* 47* 47* 51* 55* 59* 59*  CREATININE 1.98* 2.04* 2.16* 2.52* 2.59* 2.62* 2.47*  CALCIUM 7.7* 8.0* 7.9* 7.6* 7.6* 7.7* 7.8*  MG 2.0 1.9 2.1 2.0  --   --  2.0    Liver Function  Tests: No results for input(s): AST, ALT, ALKPHOS, BILITOT, PROT, ALBUMIN in the last 168 hours. No results for input(s): LIPASE, AMYLASE in the last 168 hours.  Recent Labs Lab 05/27/17 0800  AMMONIA 20    CBC:  Recent Labs Lab 05/26/17 0413 05/27/17 0419 05/28/17 0503 05/29/17 0216 05/30/17 0310 05/31/17 0322  WBC 17.8* 15.2* 16.5* 17.0* 23.7* 26.7*  NEUTROABS 15.8*  --   --   --   --   --   HGB 7.5* 9.5* 9.6* 8.5* 8.5* 10.0*  HCT 23.4* 29.2* 30.2* 26.2* 26.4* 30.5*  MCV 90.3 90.4 88.8 87.9 88.6 88.2  PLT 293 289 300 243 250 305    INR:  Recent Labs Lab 05/27/17 0419 05/28/17 0503 05/29/17 0216 05/30/17 0310 05/31/17 0322  INR 1.93 2.09 2.90 3.15 2.74   LDH: 156  Other results:  EKG:   Imaging: Ct Head Wo Contrast  Result Date: 05/30/2017 CLINICAL DATA:  Altered mental status. Seizure like activity. History of AICD, diabetes, hypertension. EXAM: CT HEAD WITHOUT CONTRAST TECHNIQUE: Contiguous axial images were obtained from the base of the skull through the vertex without intravenous contrast. COMPARISON:  CT HEAD May 27, 2017  FINDINGS: BRAIN: No intraparenchymal hemorrhage, mass effect nor midline shift. Mild ventriculomegaly on the basis of global parenchymal brain volume loss, stable from prior imaging. Patient's known RIGHT cerebellar infarcts not apparent on this examination, likely due to slice selection. Old small LEFT cerebellar infarct. Patchy supratentorial and RIGHT pontine white matter hypodensities. No acute large vascular territory infarcts. Similar prominent bifrontal low-density prominent extra-axial spaces without mass effect. Basal cisterns are patent. VASCULAR: Moderate calcific atherosclerosis of the carotid siphons. SKULL: No skull fracture. No significant scalp soft tissue swelling. SINUSES/ORBITS: Nasogastric tube via the RIGHT nares. Mild LEFT maxillary sinus mucosal thickening in atresia compatible with chronic sinusitis. Trace bilateral  mastoid effusions. Subcentimeter LEFT external auditory canal osteoma.The included ocular globes and orbital contents are non-suspicious. OTHER: None. IMPRESSION: 1. No acute intracranial process. 2. 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 Electronically Signed   By: Elon Alas M.D.   On: 05/30/2017 23:27   Dg Chest Port 1 View  Result Date: 05/31/2017 CLINICAL DATA:  Endotracheal tube present. Left ventricular assist device. EXAM: PORTABLE CHEST 1 VIEW COMPARISON:  One-view chest x-ray 05/30/2017. FINDINGS: The endotracheal tube is stable and in satisfactory position. A small bore feeding tube is in place. A right-sided PICC line is stable. AICD and left ventricular assist device are stable. The heart is enlarged. Aeration is slightly improved. There is no edema or effusion. No focal airspace disease is present. IMPRESSION: 1. Support apparatus including endotracheal tube and left ventricular assist device for stable in position. 2. Stable cardiomegaly without failure. Electronically Signed   By: San Morelle M.D.   On: 05/31/2017 07:44   Dg Chest Port 1 View  Result Date: 05/30/2017 CLINICAL DATA:  Hypoxia EXAM: PORTABLE CHEST 1 VIEW COMPARISON:  May 29, 2017 FINDINGS: Endotracheal tube tip is 2.9 cm above the carina. Central catheter tip is in the superior vena cava near the cavoatrial junction. Feeding tube extends below the diaphragm. Pacemaker lead is attached the right ventricle. Left ventricular assist device is unchanged in position. No pneumothorax. Cardiomegaly is stable. There is no appreciable edema or consolidation. Pulmonary vascularity is within normal limits. No adenopathy. No evident bone lesions. IMPRESSION: Tube and catheter positions as described without evident pneumothorax. Stable cardiomegaly. No edema or consolidation. Electronically Signed   By: Lowella Grip III M.D.   On: 05/30/2017  08:07      Medications:     Scheduled Medications: . amiodarone  200 mg Per Tube BID  . chlorhexidine gluconate (MEDLINE KIT)  15 mL Mouth Rinse BID  . Chlorhexidine Gluconate Cloth  6 each Topical Q0600  . collagenase   Topical Daily  . febuxostat  120 mg Oral Daily  . insulin aspart  0-15 Units Subcutaneous Q4H  . levothyroxine  50 mcg Per NG tube QAC breakfast  . magnesium oxide  400 mg Per Tube Daily  . mouth rinse  15 mL Mouth Rinse 10 times per day  . mexiletine  300 mg Per Tube Q12H  . pantoprazole sodium  40 mg Per Tube BID  . sodium chloride flush  10-40 mL Intracatheter Q12H  . sotalol  80 mg Per Tube Daily     Infusions: . albumin human    . feeding supplement (GLUCERNA 1.2 CAL) 1,000 mL (05/31/17 1800)  . meropenem (MERREM) IV Stopped (05/31/17 0941)  . norepinephrine (LEVOPHED) Adult infusion 8 mcg/min (05/31/17 1807)  . valproate sodium    . vancomycin Stopped (05/31/17 1041)  PRN Medications:  acetaminophen (TYLENOL) oral liquid 160 mg/5 mL, albuterol, docusate, hydrALAZINE, neomycin-bacitracin-polymyxin, ondansetron (ZOFRAN) IV, oxyCODONE, phenol, sodium chloride flush   Assessment/Plan/Discussion:    1. POD 46s/p HVAD for ischemic cardiomyopathy with acute on chronic systolic heart failure, EF 15% with moderate RV dysfunction. Returned to OR for bleeding from raw mediastinal tissues. His VAD parameters have been stable but had to be put back on levophed to support MAP with increasing dose today. I suspect that he has some degree of sepsis with rising leukocytosis and fever this am on broad spectrum antibiotics.  2.  Acute mental status changes of unclear etiology. This could be related to infection, medications, hypothyroidism, possibly stroke not see on CT. CT head from 8/7 showed lacunar infarcts that did not appear acute. Seen by neurology. EEG does not show seizures activity. Treating infection and hopefully this will improve.  He is still  lethargic but following commands. I think sepsis is probably the most likely cause.  3.  Acute respiratory failure: reintubated for airway protection. Gas exchange is ok and CXR looks fairly clear but he is too ill and weak to get off vent at this time. He may need trach.  4.  Acute on chronic kidney failure. Creat down slightly today.  Urine output ok. May be related to sepsis, volume depletion.  5.  GI bleeding and anemia: does not appear to be bleeding at this time. His Hgb is improved after transfusion.  6.  VT: None since Sotalol started in addition to mexiletine and amiodarone.  7.  Fever and Leukocytosis. Nothing has cultured out yet. Repeat BC today. Sacral pressure sore may be responsible. On Vanc and meropenum.  8.   Sacral pressure sore: Reportedly deep to sacrum. WOC has seen and started enzymatic therapy and will reevaluate Monday.   I reviewed the LVAD parameters from today, and compared the results to the patient's prior recorded data.  No programming changes were made. The LVAD is functioning within specified parameters. LVAD interrogation was negative for any significant power changes, alarms or PI events/speed drops.  LVAD equipment check completed and is in good working order.  Back-up equipment present.     Discussed status and plans with his wife at bedside.  Length of Stay: [address]  Fernande Boyden [facility] 05/31/2017, 6:56 PM

## 2017-05-31 NOTE — Progress Notes (Signed)
ANTICOAGULATION CONSULT NOTE - Follow Up Consult  Pharmacy Consult for Warfarin >>heparin Indication: HVAD  No Active Allergies  Patient Measurements: Height: 6' (182.9 cm) Weight: 223 lb 8.7 oz (101.4 kg) IBW/kg (Calculated) : 77.6  Vital Signs: Temp: 101.4 F (38.6 C) (08/11 0815) Temp Source: Oral (08/11 0815) BP: 79/60 (08/11 0830) Pulse Rate: 71 (08/11 0830)  Labs:  Recent Labs  05/29/17 0216 05/30/17 0310 05/30/17 1250 05/31/17 0322  HGB 8.5* 8.5*  --  10.0*  HCT 26.2* 26.4*  --  30.5*  PLT 243 250  --  305  LABPROT 30.9* 33.1*  --  29.6*  INR 2.90 3.15  --  2.74  CREATININE 2.52* 2.59* 2.62* 2.47*    Estimated Creatinine Clearance: 42.1 mL/min (A) (by C-G formula based on SCr of 2.47 mg/dL (H)).   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 He developed an ileus on 7/5, warfarin was held, and heparin bridge started once INR fell below 1.8.  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    Warfarin resumed 7/18 without heparin bridge.  8/8 Altered mental status continues this morning, low 02 sats and patient transferred to ICU and intubated. D/w HF  Team will hold warfarin for now and start heparin once INR has trended down to <1.8.   INR to 2.74 this morning, Hgb improved to 10.   Goal of Therapy:  Heparin level goal 0.3-0.5 INR 2-2.5 Monitor platelets by anticoagulation protocol: Yes   Plan:  -Hold warfarin for now -Start heparin once INR<1.8  Fredonia HighlandMichael Bitonti, PharmD PGY-2 Cardiology Pharmacy Resident Pager: [phone] 05/31/2017

## 2017-05-31 NOTE — Progress Notes (Signed)
Sputum sample obtained and sent down to main lab without complications.  

## 2017-05-31 NOTE — Progress Notes (Addendum)
Patient ID: James [name], male   DOB: [birth year], 56 y.o.   MRN: [id number]   Advanced Heart Failure VAD Team Note  Subjective:    Events: --HVAD placed 6/28.  Returned to the OR that evening with high chest tube output, evacuation of mediastinal hematoma.  --Extubated 6/29. Milrinone stopped on 7/4. --7/4, patient developed ileus with respiratory compromise. NGT placed with 3 L suctioned out.  CXR with suspicion for aspiration PNA.  Patient had to be intubated.  He went into atrial fibrillation with RVR.  He became hypotensive and was started on norepinephrine and phenylephrine.  Amiodarone gtt begun.    --Extubated again on 7/6.  -- 7/13 Had 2 sustained episodes of VT. Had to be cardioverted 1. Second episode broke with overdrive pacing with Dr. Caryl Comes. VAD speed turned down to 2700.  --Melena due to acuteGI bleed --> 2 units PRBCs 7/14, 2 units 7/15. 3 U PRBCs 7/16,  04/23/2017 3 UPRBCs.  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  He had epinephrine, APC, and 2 clips. Intubated prior to procedure and placed on norepinephrine. Speed dropped to 2660.  --7/18 Back in VT with overdrive pacing. Lidocaine was started in addition to amiodarone. Extubated. --7/19 lidocaine stopped with confusion. Neuro consulted.  EEG normal.  CT of head no acute findings. 7/20 confusion had resolved.  --1 unit PRBCs 7/20.  --7/22, developed sustained VT with rate around 130 shortly after getting IV Lasix.  We were able to pace him out and back to NSR.  He was put back on amiodarone gtt.  --More VT on 7/25, paced out.  Back on IV amiodarone gtt.  NSR.  -- 7/31 Ramp ECHO decrease speed 2600 rpm.  --8/1 VT- paced out --8/2 VT - started on sotalol --8/6 Hgb 7.5, 1 unit PRBCs - 8/7 Hgb 9.5, confused.  Head CT with right superior cerebellar artery lacunar infarcts, not acute. - 8/8 Possible aspiration. Reintubated. - 8/10 possible seizure => Depakote.  CT head no  change.  1 unit PRBCs.   Remains intubated, awake on vent and responds appropriately.  Norepinephrine up to 10 yesterday, now down to 4.  CVP 7-8.  Creatinine down to 2.47 today. WBCs up to 26.7.  Appropriate rise in creatinine.  Afebrile.   CXR without infiltrate/edema.   HVAD INTERROGATION:  HVAD:  Flow 4.7 liters/min, speed 2600,  power 4 W,  Peak 6.8 Trough 4. No alarms. Suction On. Lavare On.  Objective:    Vital Signs:   Temp:  [97.9 F (36.6 C)-99.4 F (37.4 C)] 98.2 F (36.8 C) (08/11 0400) Pulse Rate:  [47-92] 70 (08/11 0715) Resp:  [15-31] 22 (08/11 0715) BP: (64-128)/(33-92) 89/67 (08/11 0715) SpO2:  [83 %-100 %] 100 % (08/11 0715) FiO2 (%):  [40 %] 40 % (08/11 0400) Weight:  [223 lb 8.7 oz (101.4 kg)] 223 lb 8.7 oz (101.4 kg) (08/11 0500) Last BM Date: 05/30/17 Mean arterial Pressure 70s Intake/Output:   Intake/Output Summary (Last 24 hours) at 05/31/17 0754 Last data filed at 05/31/17 0700  Gross per 24 hour  Intake          3359.21 ml  Output              725 ml  Net          2634.21 ml     Physical Exam  CVP 7-8 Physical Exam: GENERAL: Intubated. Opens eyes. HEENT: ETT Cor-track NECK: Supple, JVP hard to assess with lines  but does not appear significantly elevated.  No lymphadenopathy or thyromegaly appreciated.   CARDIAC:  Mechanical heart sounds with LVAD hum present.  LUNGS:  Coarse throughout.   ABDOMEN:  Soft, round, nontender, positive bowel sounds x4.     LVAD exit site: well-healed and incorporated.  Dressing dry and intact.  No erythema or drainage.  Stabilization device present and accurately applied.  Driveline dressing is being changed daily per sterile technique. EXTREMITIES:  Warm and dry, no cyanosis, clubbing, rash. R and LLE 2+  RUE PICC NEUROLOGIC:  Intubated follows commands.  Skin: sacrum. Unstageable pressure ulcer      Telemetry   Personally reviewed: v-paced, underlying atrial rhythm difficult.   Labs   Basic Metabolic  Panel:  Recent Labs Lab 05/26/17 0413 05/27/17 0419 05/28/17 0503 05/29/17 0216 05/30/17 0310 05/30/17 1250 05/31/17 0322  NA 137 137 137 140 141 141 143  K 4.2 4.2 4.4 4.3 4.1 4.4 4.2  CL 108 106 108 111 112* 113* 113*  CO2 22 22 20* 19* 20* 19* 21*  GLUCOSE 150* 145* 111* 104* 135* 166* 210*  BUN 45* 47* 47* 51* 55* 59* 59*  CREATININE 1.98* 2.04* 2.16* 2.52* 2.59* 2.62* 2.47*  CALCIUM 7.7* 8.0* 7.9* 7.6* 7.6* 7.7* 7.8*  MG 2.0 1.9 2.1 2.0  --   --  2.0    Liver Function Tests: No results for input(s): AST, ALT, ALKPHOS, BILITOT, PROT, ALBUMIN in the last 168 hours. No results for input(s): LIPASE, AMYLASE in the last 168 hours.  Recent Labs Lab 05/27/17 0800  AMMONIA 20    CBC:  Recent Labs Lab 05/26/17 0413 05/27/17 0419 05/28/17 0503 05/29/17 0216 05/30/17 0310 05/31/17 0322  WBC 17.8* 15.2* 16.5* 17.0* 23.7* 26.7*  NEUTROABS 15.8*  --   --   --   --   --   HGB 7.5* 9.5* 9.6* 8.5* 8.5* 10.0*  HCT 23.4* 29.2* 30.2* 26.2* 26.4* 30.5*  MCV 90.3 90.4 88.8 87.9 88.6 88.2  PLT 293 289 300 243 250 305    INR:  Recent Labs Lab 05/27/17 0419 05/28/17 0503 05/29/17 0216 05/30/17 0310 05/31/17 0322  INR 1.93 2.09 2.90 3.15 2.74    Other results:     Imaging   Ct Head Wo Contrast  Result Date: 05/30/2017 CLINICAL DATA:  Altered mental status. Seizure like activity. History of AICD, diabetes, hypertension. EXAM: CT HEAD WITHOUT CONTRAST TECHNIQUE: Contiguous axial images were obtained from the base of the skull through the vertex without intravenous contrast. COMPARISON:  CT HEAD May 27, 2017 FINDINGS: BRAIN: No intraparenchymal hemorrhage, mass effect nor midline shift. Mild ventriculomegaly on the basis of global parenchymal brain volume loss, stable from prior imaging. Patient's known RIGHT cerebellar infarcts not apparent on this examination, likely due to slice selection. Old small LEFT cerebellar infarct. Patchy supratentorial and RIGHT pontine  white matter hypodensities. No acute large vascular territory infarcts. Similar prominent bifrontal low-density prominent extra-axial spaces without mass effect. Basal cisterns are patent. VASCULAR: Moderate calcific atherosclerosis of the carotid siphons. SKULL: No skull fracture. No significant scalp soft tissue swelling. SINUSES/ORBITS: Nasogastric tube via the RIGHT nares. Mild LEFT maxillary sinus mucosal thickening in atresia compatible with chronic sinusitis. Trace bilateral mastoid effusions. Subcentimeter LEFT external auditory canal osteoma.The included ocular globes and orbital contents are non-suspicious. OTHER: None. IMPRESSION: 1. No acute intracranial process. 2. 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 Electronically Signed   By: Thana Farr.D.  On: 05/30/2017 23:27   Dg Chest Port 1 View  Result Date: 05/31/2017 CLINICAL DATA:  Endotracheal tube present. Left ventricular assist device. EXAM: PORTABLE CHEST 1 VIEW COMPARISON:  One-view chest x-ray 05/30/2017. FINDINGS: The endotracheal tube is stable and in satisfactory position. A small bore feeding tube is in place. A right-sided PICC line is stable. AICD and left ventricular assist device are stable. The heart is enlarged. Aeration is slightly improved. There is no edema or effusion. No focal airspace disease is present. IMPRESSION: 1. Support apparatus including endotracheal tube and left ventricular assist device for stable in position. 2. Stable cardiomegaly without failure. Electronically Signed   By: San Morelle M.D.   On: 05/31/2017 07:44   Dg Chest Port 1 View  Result Date: 05/30/2017 CLINICAL DATA:  Hypoxia EXAM: PORTABLE CHEST 1 VIEW COMPARISON:  May 29, 2017 FINDINGS: Endotracheal tube tip is 2.9 cm above the carina. Central catheter tip is in the superior vena cava near the cavoatrial junction. Feeding tube extends below the  diaphragm. Pacemaker lead is attached the right ventricle. Left ventricular assist device is unchanged in position. No pneumothorax. Cardiomegaly is stable. There is no appreciable edema or consolidation. Pulmonary vascularity is within normal limits. No adenopathy. No evident bone lesions. IMPRESSION: Tube and catheter positions as described without evident pneumothorax. Stable cardiomegaly. No edema or consolidation. Electronically Signed   By: Lowella Grip III M.D.   On: 05/30/2017 08:07     Medications:     Scheduled Medications: . amiodarone  200 mg Per Tube BID  . chlorhexidine gluconate (MEDLINE KIT)  15 mL Mouth Rinse BID  . Chlorhexidine Gluconate Cloth  6 each Topical Q0600  . collagenase   Topical Daily  . febuxostat  120 mg Oral Daily  . insulin aspart  0-15 Units Subcutaneous Q4H  . levothyroxine  50 mcg Per NG tube QAC breakfast  . magnesium oxide  400 mg Per Tube Daily  . mouth rinse  15 mL Mouth Rinse 10 times per day  . mexiletine  300 mg Per Tube Q12H  . pantoprazole sodium  40 mg Per Tube BID  . sodium chloride flush  10-40 mL Intracatheter Q12H  . sotalol  80 mg Per Tube Daily    Infusions: . albumin human    . feeding supplement (GLUCERNA 1.2 CAL) 1,000 mL (05/31/17 0700)  . meropenem (MERREM) IV Stopped (05/30/17 2230)  . norepinephrine (LEVOPHED) Adult infusion 4 mcg/min (05/31/17 0700)  . valproate sodium Stopped (05/31/17 0600)  . vancomycin Stopped (05/30/17 1151)    PRN Medications: acetaminophen (TYLENOL) oral liquid 160 mg/5 mL, albuterol, docusate, hydrALAZINE, neomycin-bacitracin-polymyxin, ondansetron (ZOFRAN) IV, oxyCODONE, phenol, sodium chloride flush   Patient Profile   56 yo with CAD s/p CABG, ischemic cardiomyopathy/chronic systolic CHF, tophaceous gout, and CKD stage 3 was admitted for diuresis and consideration for LVAD placement. S/p HVAD on 6/28  Assessment/Plan:    1. Acute/chronic systolic CHF s/p HVAD: Ischemic  cardiomyopathy.  St Jude ICD.  Echo (6/18) with EF 15%, mildly dilated RV with moderately decreased systolic function.  s/p HVAD placement 6/28 and had to return to OR to evacuate mediastinal hematoma.  He had been weaned off pressors/milrinone, but developed ileus w/ likely aspiration event 7/4, re-intubated, developed afib/RVR requiring norepinephrine. Extubated 7/6.  VAD speed turned down to 2600 with VT. VAD parameters currently look good.  Milrinone stopped 7/17 with recurrent VT.  He was started back on norepinephrine 8/9 due to soft BP after intubation, increased to  10 yesterday but now down to 4. MAP 70s today with CVP 7-8 and co-ox 74%.    - Wean down norepinephrine as able, possible component of septic/vasodilatory shock with large pressure ulcer.  - Anti-hypertensives on hold.  - Hold diuretics today.  - Off ASA with GI Bleed  - Goal INR 2-2.5. Today's INR 2.7.     2. AKI on CKD stage 3: Suspect a component of peri-op ATN, worsened with ileus, vomiting, intubation/sedation. Creatinine down to 2.47 today.  3. Symptomatic anemia due to acute UGI bleeding:  Received 3 units PRBCs 7/16 and 3 units PRBCs on 04/27/2017. 1 unit PRBCs 7/20. 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  Requiring 2 clips + epi + APC. 1 unit PRBCs 8/6.  He had 1 unit PRBCs 8/10 to provide volume.  Hemoglobin up appropriately.  - Got Feraheme 7/28.  - Continue Protonix 40 po bid. - Off aspirin for now.  - Transfuse hgb < 8 4. Ventricular tachycardia: Status post ventricular tachycardia 2 on 7/13. Possible suction event. VAD speed turned down to 2700.  Recurrent VT 7/17. EP called to bedside. Overdrive pacing successful for about 10 minutes but VT recurred.  Milrinone turned off, remained in VT overnight but hemodynamically stable.  Speed decreased to 2600.  On 7/18, we were able to pace him out of VT.  Lidocaine added then stopped due to confusion/mental status changes.  He had VT 7/22  about 30 minutes after getting IV Lasix, had to be paced out again => may have been due to rapid fluid shift.  Pt paced out of VT 3 separate occasions 7/25. Got amio bolus x 2 and started back on IV infusion.  Transitioned to amiodarone 400 mg tid.  On 8/1 had recurrent VT and paced out again.  Recurrent VT 8/2 per EP sotalol started along with amiodarone, sotalol decreased to once daily with bradycardia.   - No VT over night. Continue mexiletine to 300 mg BID, sotalol 80 mg daily, amio 200 mg BID . 5. CAD: s/p CABG 2012. No S/S ischemia.  Off aspirin due to GI bleeding. No change.   6. Gout: Severe tophaceous gout. Left shoulder pain ?gout.     - Continue uloric. NO change.  7. Atrial fibrillation: Developed atrial fibrillation with RVR in setting of aspiration PNA on 7/4.    Hard to know with interference from Emanuel, he is now v-pacing and cannot tell underlying atrial rhythm.   8. Malnutrition: Continue tube feeds. Nutrition following.  9. Aspiration PNA with acute hypoxemic respiratory failure on 7/4: He was extubated on 7/6. Tracheal aspirate with Klebsiella and citrobacter. Both sensitive to Zosyn. Completed abx 7/13. Possible aspiration 8/8.    - On vanco/meropenum.  WBC up to 26.7  10. Ileus: Resolved.  11. Deconditioning:  Plan for CIR when stable. OT/PT appreciated. CIR on hold. Reconsult once extubated.   12. Acute Respiratory Failure: Intubated 7/17 in setting of complex GI intervention. Extubated 7/18. Re intubated 8/8 .  13. Delirium: Resolved. No further. Appreciate neuro input Possibly related to lidocaine.  Lidocaine stopped. CT of head negative. EEG ok. No further workup per neuro. Neuro  reconsulted 8/7 with AMS => ?related to fever/infection.  He is clear on vent this morning.  - CT head 05/27/17 and 8/10 with lacunar infarcts but do not appear acute.   - Seizure activity versus myoclonus => Depakote started.    15. Insomnia: On remeron for sleep and depression. 16.  Post-op  depression: Remeron started 7/28.  17. UTI: on vancomycin and meropenum. Urine culture- NGTD.  18. Unstageable Pressure Ulcer: Sacrum. Consult WOC Needs to start santyl daily to enzymatically debride. Reposition R to L. WOC recommendations appreciated.   19. Bradycardia: With hypotension.  Currently back-up pacing set at 70 bpm.  Does not have atrial lead so pacing asynchronously.  20. Hypothyroidism: May be related to amiodarone, he is on Levoxyl.   I reviewed the HVAD parameters from today, and compared the results to the patient's prior recorded data.  No programming changes were made.  The HVAD is functioning within specified parameters.    CRITICAL CARE Performed by: Loralie Champagne  Total critical care time: 40 minutes  Critical care time was exclusive of separately billable procedures and treating other patients.  Critical care was necessary to treat or prevent imminent or life-threatening deterioration.  Critical care was time spent personally by me on the following activities: development of treatment plan with patient and/or surrogate as well as nursing, discussions with consultants, evaluation of patient's response to treatment, examination of patient, obtaining history from patient or surrogate, ordering and performing treatments and interventions, ordering and review of laboratory studies, ordering and review of radiographic studies, pulse oximetry and re-evaluation of patient's condition.  Loralie Champagne, MD 05/31/2017, 7:54 AM  VAD Team --- VAD ISSUES ONLY--- Pager [phone] (7am - 7am) Advanced Heart Failure Team  Pager [phone] (M-F; 7a - 4p)  Please contact [facility] for night-coverage after hours (4p -7a ) and weekends on [email or web address]

## 2017-05-31 NOTE — Progress Notes (Signed)
Subjective: No further spells.   Exam: Vitals:   05/31/17 0830 05/31/17 0835  BP: (!) 79/60 (!) 79/60  Pulse: 71 70  Resp: (!) 23 (!) 26  Temp:    SpO2: 100% 100%   Gen: In bed, NAD Resp: non-labored breathing, no acute distress Abd: soft, nt  Neuro: MS: awake, alert, follows commands readily.  ZO:[id number]:PERRL, eyes conjugate.  Motor: Moves all extremities to command symmetrically.  Sensory: intact to LT  Pertinent Labs: Elevated creatinine  CT head - no acute findings  Impression: 56 year old male with recurrent encephalopathy. His exam appeared most consistent with a toxic/metabolic encephalopathy and he had a urinalysis which could be consistent with UTI. Especially if he is actually hypothyroid, it could be that he became encephalopathic because of the urinary tract infection, with subsequent worsening due to an aspiration event. He had a negative EEG, though no spells during EEG.   Yesterday, he had transient unresponsiveness. Nursing was in the process of titrating up on pressors, and by the time that a BP was checked, he was starting to come around. He apparently had facial twitching and his "eyes rolled back in his head." On my exam, he was unresponsive but then rapidly improved.   Possible etiologies include convulsive syncope vs seizure. Given the continued concern, I think short term AED therapy is reasonable, but would not continue to aggressively uptitrate without more definitive  Recommendations: 1) Depakote 500mg  BID 2) Once stable, extubated, and better from a mental status standpoint(e.g. Easier to recognize seizures), could discontinue depakote. This could even be done as an outpatient if desired.  3) No further recommendations at this time, please call with further questions or concerns.   Ritta SlotMcNeill Kirkpatrick, MD [facility] [phone]  If 7pm- 7am, please page neurology on call as listed in AMION.

## 2017-06-01 ENCOUNTER — Inpatient Hospital Stay (HOSPITAL_COMMUNITY): Payer: Medicare PPO

## 2017-06-01 LAB — CULTURE, BLOOD (ROUTINE X 2)
Culture: NO GROWTH
Culture: NO GROWTH
Special Requests: ADEQUATE
Special Requests: ADEQUATE

## 2017-06-01 LAB — CBC
HEMATOCRIT: 30.7 % — AB (ref 39.0–52.0)
HEMOGLOBIN: 10 g/dL — AB (ref 13.0–17.0)
MCH: 28.5 pg (ref 26.0–34.0)
MCHC: 32.6 g/dL (ref 30.0–36.0)
MCV: 87.5 fL (ref 78.0–100.0)
Platelets: 295 10*3/uL (ref 150–400)
RBC: 3.51 MIL/uL — ABNORMAL LOW (ref 4.22–5.81)
RDW: 18.3 % — ABNORMAL HIGH (ref 11.5–15.5)
WBC: 23.6 10*3/uL — ABNORMAL HIGH (ref 4.0–10.5)

## 2017-06-01 LAB — GLUCOSE, CAPILLARY
GLUCOSE-CAPILLARY: 91 mg/dL (ref 65–99)
GLUCOSE-CAPILLARY: 193 mg/dL — AB (ref 65–99)
Glucose-Capillary: 149 mg/dL — ABNORMAL HIGH (ref 65–99)

## 2017-06-01 LAB — BASIC METABOLIC PANEL
Anion gap: 7 (ref 5–15)
BUN: 63 mg/dL — AB (ref 6–20)
CHLORIDE: 114 mmol/L — AB (ref 101–111)
CO2: 22 mmol/L (ref 22–32)
CREATININE: 1.72 mg/dL — AB (ref 0.61–1.24)
Calcium: 7.8 mg/dL — ABNORMAL LOW (ref 8.9–10.3)
GFR calc Af Amer: 50 mL/min — ABNORMAL LOW (ref >60)
GFR calc non Af Amer: 43 mL/min — ABNORMAL LOW (ref >60)
Glucose, Bld: 153 mg/dL — ABNORMAL HIGH (ref 65–99)
Potassium: 4.4 mmol/L (ref 3.5–5.1)
Sodium: 143 mmol/L (ref 135–145)

## 2017-06-01 LAB — COOXEMETRY PANEL
CARBOXYHEMOGLOBIN: 0.9 % (ref 0.5–1.5)
Methemoglobin: 1.2 % (ref 0.0–1.5)
O2 Saturation: 77.2 %
TOTAL HEMOGLOBIN: 9 g/dL — AB (ref 12.0–16.0)

## 2017-06-01 LAB — VANCOMYCIN, TROUGH: Vancomycin Tr: 30 ug/mL (ref 15–20)

## 2017-06-01 LAB — PROTIME-INR
INR: 2.25
PROTHROMBIN TIME: 25.2 s — AB (ref 11.4–15.2)

## 2017-06-01 LAB — LACTATE DEHYDROGENASE: LDH: 156 U/L (ref 98–192)

## 2017-06-01 MED ORDER — FUROSEMIDE 10 MG/ML IJ SOLN: 40.0000 mg | Freq: Two times a day (BID) | INTRAMUSCULAR | Status: DC

## 2017-06-01 NOTE — Progress Notes (Signed)
Patient with temp 103.0 oral. Blood cultures ordered.

## 2017-06-01 NOTE — Progress Notes (Signed)
ANTICOAGULATION CONSULT NOTE - Follow Up Consult  Pharmacy Consult for Warfarin >>heparin Indication: HVAD  No Active Allergies  Patient Measurements: Height: 6' (182.9 cm) Weight: 224 lb 3.3 oz (101.7 kg) IBW/kg (Calculated) : 77.6  Vital Signs: Temp: 99.5 F (37.5 C) (08/12 0400) Temp Source: Oral (08/12 0400) BP: 93/65 (08/12 0700) Pulse Rate: 69 (08/12 0700)  Labs:  Recent Labs  05/30/17 0310 05/30/17 1250 05/31/17 0322 06/01/17 0323  HGB 8.5*  --  10.0* 10.0*  HCT 26.4*  --  30.5* 30.7*  PLT 250  --  305 295  LABPROT 33.1*  --  29.6* 25.2*  INR 3.15  --  2.74 2.25  CREATININE 2.59* 2.62* 2.47* 1.72*    Estimated Creatinine Clearance: 60.6 mL/min (A) (by C-G formula based on SCr of 1.72 mg/dL (H)).   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 He developed an ileus on 7/5, warfarin was held, and heparin bridge started once INR fell below 1.8.  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    Warfarin resumed 7/18 without heparin bridge.  8/8 Altered mental status continues this morning, low 02 sats and patient transferred to ICU and intubated. D/w HF  Team will hold warfarin for now and start heparin once INR has trended down to <1.8.   INR trending down to 2.25 this morning, Hgb remains stable at 10.   Goal of Therapy:  Heparin level goal 0.3-0.5 INR 2-2.5 Monitor platelets by anticoagulation protocol: Yes   Plan:  -Hold warfarin for now -Daily INR -Start heparin once INR<1.8  Fredonia HighlandMichael Bitonti, PharmD PGY-2 Cardiology Pharmacy Resident Pager: [phone] 06/01/2017

## 2017-06-01 NOTE — Progress Notes (Addendum)
CRITICAL VALUE ALERT  Critical Value:  Vancomycin trough 30  Date & Time Notied:  06/01/17 1023  Provider Notified: Pharmacist Casimiro NeedleMichael notified  Orders Received/Actions taken: Vancomycin on hold for now

## 2017-06-01 NOTE — Progress Notes (Signed)
CCM ordered Lasix, not given per Dr. Sharee PimpleBartle's order.

## 2017-06-01 NOTE — Progress Notes (Signed)
PULMONARY / CRITICAL CARE MEDICINE   Name: James [name] MRN: [id number] DOB: 03-10-1961    ADMISSION DATE:  04/08/2017 CONSULTATION DATE:  04/23/2017  REFERRING MD:  Laneta SimmersBartle - CVTS  CHIEF COMPLAINT:  Acute respiratory failure  BRIEF SUMMARY:   [name] with CHF (EF 15%), AICD, CAD, Gout, HTN, CKD, DM, Anemia, admitted 04/05/2017 for VAD placement, he had already been turned down by [facility] for transplant due to his gout and limitations with his mobility. Now s/p HVAD placement for his ischemic cardiomyopathy. His course has been complicated by AKI-on-CKD, and also complicated by pre-op Afib with RVR. Developed acute hypoxic respiratory failure requiring 15L O2 NRB, and he developed worsening respiratory distress, prompting ICU consult.  He was intubated 7/5 for respiratory arrest, aspiration PNA > developed septic shock / required vasopressors.  Extubated 7/6.  He was reintubated again on 7/17 when he decompensated during EGD. He was extubated on 7/18.  Hospital course complicated by recurrent episodes of SVT, SVT, GI bleed. On the morning of 8/8 he became more somnolent with altered mental status, increased work of breathing. NG tube was noted to be displaced with likely aspiration. PCCM consulted for help with management.  SUBJECTIVE:   No acute events overnight , remains on pressors with Levophed.  Cont on vent . Family updated at bedside.  Follows commands . Failed SBT this am    VITAL SIGNS: BP 93/75   Pulse 70   Temp 99.6 F (37.6 C) (Oral)   Resp 19   Ht 6' (1.829 m)   Wt 224 lb 3.3 oz (101.7 kg)   SpO2 100%   BMI 30.41 kg/m   HEMODYNAMICS: CVP:  [4 mmHg-8 mmHg] 6 mmHg  VENTILATOR SETTINGS: Vent Mode: PRVC FiO2 (%):  [40 %] 40 % Set Rate:  [14 bmp] 14 bmp Vt Set:  [620 mL] 620 mL PEEP:  [5 cmH20] 5 cmH20 Plateau Pressure:  [15 cmH20-18 cmH20] 18 cmH20  INTAKE / OUTPUT: I/O last 3 completed shifts: In: 4632.6 [I.V.:282.6; Blood:315; Other:110; NG/GT:2910; IV  Piggyback:1015] Out: 1523 [Urine:1023; Stool:500]  PHYSICAL EXAMINATION: General: Chronically ill appearing male on vent  HEENT: ETT , NGT  Neuro: Awake, f/c , gen weakness  CV: VAD hum  PULM : even on vent ,CTA bil GI: obese , soft , BS +  Extremities: w/d , gen 1+ edema , gouty joints/hands.  Skin: no rash,  R ankle dressing in place.   LABS:  BMET  Recent Labs Lab 05/30/17 1250 05/31/17 0322 06/01/17 0323  NA 141 143 143  K 4.4 4.2 4.4  CL 113* 113* 114*  CO2 19* 21* 22  BUN 59* 59* 63*  CREATININE 2.62* 2.47* 1.72*  GLUCOSE 166* 210* 153*   Electrolytes  Recent Labs Lab 05/28/17 0503 05/29/17 0216  05/30/17 1250 05/31/17 0322 06/01/17 0323  CALCIUM 7.9* 7.6*  < > 7.7* 7.8* 7.8*  MG 2.1 2.0  --   --  2.0  --   < > = values in this interval not displayed. CBC  Recent Labs Lab 05/30/17 0310 05/31/17 0322 06/01/17 0323  WBC 23.7* 26.7* 23.6*  HGB 8.5* 10.0* 10.0*  HCT 26.4* 30.5* 30.7*  PLT 250 305 295   Coag's  Recent Labs Lab 05/30/17 0310 05/31/17 0322 06/01/17 0323  INR 3.15 2.74 2.25   Sepsis Markers  Recent Labs Lab 05/28/17 1017 05/29/17 0216 05/29/17 0230 05/30/17 0310  LATICACIDVEN  --   --  1.1  --   PROCALCITON 0.99 1.49  --  1.33   ABG  Recent Labs Lab 05/28/17 1132  PHART 7.352  PCO2ART 37.6  PO2ART 360.0*   Liver Enzymes No results for input(s): AST, ALT, ALKPHOS, BILITOT, ALBUMIN in the last 168 hours.  Cardiac Enzymes No results for input(s): TROPONINI, PROBNP in the last 168 hours.  Glucose  Recent Labs Lab 05/31/17 0323 05/31/17 0924 05/31/17 1131 05/31/17 1949 05/31/17 2324 06/01/17 0327  GLUCAP 196* 170* 185* 162* 207* 149*    Imaging Dg Chest Port 1 View  Result Date: 06/01/2017 CLINICAL DATA:  Acute respiratory failure with hypoxia. EXAM: PORTABLE CHEST 1 VIEW COMPARISON:  One-view chest x-ray 05/31/2017 FINDINGS: Endotracheal tube is stable, 4 cm above the carina. A small bore feeding tube  courses off the inferior border of the film. Left ventricular assist device is in place. Lung volumes are low. Mild pulmonary vascular congestion has increased. IMPRESSION: 1. Slight increase in mild pulmonary vascular congestion. 2. Support apparatus is stable. Electronically Signed   By: Marin Roberts M.D.   On: 06/01/2017 07:26   STUDIES:  CT Head 8/7 >> small bilateral subdural hygromas or hematomas, but not significantly changed since July and clinical significance unclear.  Right superior cerebellar artery lacunar infarcts have become apparent since 05/08/2017, but have a chronic appearance today.  CULTURES: Blood 7/5 >> neg Sputum 7/5 >>klebsieall, citrobacter Sputum 7/7 >> klebsiella - resistant to amp otherwise sens [facility] 8/7 >>NEG  UC 8/7 >> negative  Sputum 8/8 >> multiple organisms, none predominant  ANTIBIOTICS: Vancomycin 7/5 >> 7/7; 8/8 >> Zosyn 7/5 >> 7/13 Meropenem 8/8 >>  SIGNIFICANT EVENTS: 7/05  Respiratory arrest post feculent vomitus episode 7/06  Extubated  7/17  Intubated during EGD 7/18  Extubated 8/06  Resp distress, aspirated. intubated  LINES/TUBES: ETT 7/5 >> 7/6, 7/17-7/18 Double lumen PICC on the right 7/2 >>  ASSESSMENT / PLAN: [name] with CHF (EF 15%), AICD, CAD, Gout, HTN, CKD, DM, Anemia, admitted 03/31/2017 for VAD placement, now s/p HVAD placement, with course complicated by AKI-on-CKD and pre-op Afib with RVR, GI bleed recurrent arrhythmia.  Decompensated in setting of aspiration on 8/8 requiring intubation.  PULMONARY A: Acute hypoxic respiratory failure requiring intubation 8/8 - likely secondary to aspiration of tube feeds  P: PRVC 8 cc/kg Daily SBT/WUA as able > Cardiology ok with extubation when meets criteria Trend CXR  PRN albuterol Continue abx as above, narrow as able Cultures as above  Acute on Chronic Systolic CHF / ICM - [employer] ICD, turned down for transplant S/p HVAD Atrial Fibrillation - TSH 9.45 on 8/7 Reccurent  VT Mediastinal Hematoma s/p Evacuation post LVAD CHF (EF 15%); CAD; History HTN AKI on CKD stage 3 P: ICU monitoring  Per CVTS / CHF Service  Continue synthroid Recheck TSH in 6 weeks (9/21)  Protein calorie malnutrition  P: TF per nutrition     Tammy Parrett NP-C  [facility]  [phone]   06/01/2017, 9:14 AM

## 2017-06-01 NOTE — Progress Notes (Signed)
Patient ID: James [name], male   DOB: 1961/06/04, 56 y.o.   MRN: [id number] HVAD Rounding Note  Subjective:    Remains intubated. Levophed wean down to 4 mcg today. CVP 7.  Spiked temp to 103 today and blood cultures sent.   LVAD INTERROGATION:  HVAD:  Flow 4.6 liters/min, speed 2600, power 4W  Objective:    Vital Signs:   Temp:  [98.4 F (36.9 C)-103 F (39.4 C)] 98.4 F (36.9 C) (08/12 1600) Pulse Rate:  [68-74] 71 (08/12 1700) Resp:  [16-30] 18 (08/12 1700) BP: (79-115)/(51-88) 103/68 (08/12 1700) SpO2:  [96 %-100 %] 100 % (08/12 1700) FiO2 (%):  [40 %] 40 % (08/12 1535) Weight:  [101.7 kg (224 lb 3.3 oz)] 101.7 kg (224 lb 3.3 oz) (08/12 0500) Last BM Date: 06/01/17 Mean arterial Pressure 81  Intake/Output:   Intake/Output Summary (Last 24 hours) at 06/01/17 1753 Last data filed at 06/01/17 1700  Gross per 24 hour  Intake          2816.48 ml  Output              724 ml  Net          2092.48 ml     Physical Exam: General: Intubated but opens eyes and responds weakly. HEENT: normal Cor:  LVAD hum present. Lungs: coarse Abdomen: soft, nontender, nondistended.  Good bowel sounds. Liquid stool in flexiseal Extremities: marked lower ext edema Neuro: follows commands  Telemetry: V-paced  Labs: Basic Metabolic Panel:  Recent Labs Lab 05/26/17 0413 05/27/17 0419 05/28/17 0503 05/29/17 0216 05/30/17 0310 05/30/17 1250 05/31/17 0322 06/01/17 0323  NA 137 137 137 140 141 141 143 143  K 4.2 4.2 4.4 4.3 4.1 4.4 4.2 4.4  CL 108 106 108 111 112* 113* 113* 114*  CO2 22 22 20* 19* 20* 19* 21* 22  GLUCOSE 150* 145* 111* 104* 135* 166* 210* 153*  BUN 45* 47* 47* 51* 55* 59* 59* 63*  CREATININE 1.98* 2.04* 2.16* 2.52* 2.59* 2.62* 2.47* 1.72*  CALCIUM 7.7* 8.0* 7.9* 7.6* 7.6* 7.7* 7.8* 7.8*  MG 2.0 1.9 2.1 2.0  --   --  2.0  --     Liver Function Tests: No results for input(s): AST, ALT, ALKPHOS, BILITOT, PROT, ALBUMIN in the last 168 hours. No results for  input(s): LIPASE, AMYLASE in the last 168 hours.  Recent Labs Lab 05/27/17 0800  AMMONIA 20    CBC:  Recent Labs Lab 05/26/17 0413  05/28/17 0503 05/29/17 0216 05/30/17 0310 05/31/17 0322 06/01/17 0323  WBC 17.8*  < > 16.5* 17.0* 23.7* 26.7* 23.6*  NEUTROABS 15.8*  --   --   --   --   --   --   HGB 7.5*  < > 9.6* 8.5* 8.5* 10.0* 10.0*  HCT 23.4*  < > 30.2* 26.2* 26.4* 30.5* 30.7*  MCV 90.3  < > 88.8 87.9 88.6 88.2 87.5  PLT 293  < > 300 243 250 305 295  < > = values in this interval not displayed.  INR:  Recent Labs Lab 05/28/17 0503 05/29/17 0216 05/30/17 0310 05/31/17 0322 06/01/17 0323  INR 2.09 2.90 3.15 2.74 2.25    Other results:  EKG:   Imaging: Ct Head Wo Contrast  Result Date: 05/30/2017 CLINICAL DATA:  Altered mental status. Seizure like activity. History of AICD, diabetes, hypertension. EXAM: CT HEAD WITHOUT CONTRAST TECHNIQUE: Contiguous axial images were obtained from the base of the skull through the vertex  without intravenous contrast. COMPARISON:  CT HEAD May 27, 2017 FINDINGS: BRAIN: No intraparenchymal hemorrhage, mass effect nor midline shift. Mild ventriculomegaly on the basis of global parenchymal brain volume loss, stable from prior imaging. Patient's known RIGHT cerebellar infarcts not apparent on this examination, likely due to slice selection. Old small LEFT cerebellar infarct. Patchy supratentorial and RIGHT pontine white matter hypodensities. No acute large vascular territory infarcts. Similar prominent bifrontal low-density prominent extra-axial spaces without mass effect. Basal cisterns are patent. VASCULAR: Moderate calcific atherosclerosis of the carotid siphons. SKULL: No skull fracture. No significant scalp soft tissue swelling. SINUSES/ORBITS: Nasogastric tube via the RIGHT nares. Mild LEFT maxillary sinus mucosal thickening in atresia compatible with chronic sinusitis. Trace bilateral mastoid effusions. Subcentimeter LEFT external  auditory canal osteoma.The included ocular globes and orbital contents are non-suspicious. OTHER: None. IMPRESSION: 1. No acute intracranial process. 2. 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 Electronically Signed   By: Elon Alas M.D.   On: 05/30/2017 23:27   Dg Chest Port 1 View  Result Date: 06/01/2017 CLINICAL DATA:  Acute respiratory failure with hypoxia. EXAM: PORTABLE CHEST 1 VIEW COMPARISON:  One-view chest x-ray 05/31/2017 FINDINGS: Endotracheal tube is stable, 4 cm above the carina. A small bore feeding tube courses off the inferior border of the film. Left ventricular assist device is in place. Lung volumes are low. Mild pulmonary vascular congestion has increased. IMPRESSION: 1. Slight increase in mild pulmonary vascular congestion. 2. Support apparatus is stable. Electronically Signed   By: San Morelle M.D.   On: 06/01/2017 07:26   Dg Chest Port 1 View  Result Date: 05/31/2017 CLINICAL DATA:  Endotracheal tube present. Left ventricular assist device. EXAM: PORTABLE CHEST 1 VIEW COMPARISON:  One-view chest x-ray 05/30/2017. FINDINGS: The endotracheal tube is stable and in satisfactory position. A small bore feeding tube is in place. A right-sided PICC line is stable. AICD and left ventricular assist device are stable. The heart is enlarged. Aeration is slightly improved. There is no edema or effusion. No focal airspace disease is present. IMPRESSION: 1. Support apparatus including endotracheal tube and left ventricular assist device for stable in position. 2. Stable cardiomegaly without failure. Electronically Signed   By: San Morelle M.D.   On: 05/31/2017 07:44      Medications:     Scheduled Medications: . amiodarone  200 mg Per Tube BID  . chlorhexidine gluconate (MEDLINE KIT)  15 mL Mouth Rinse BID  . Chlorhexidine Gluconate Cloth  6 each Topical Q0600  . collagenase   Topical  Daily  . febuxostat  120 mg Oral Daily  . insulin aspart  0-15 Units Subcutaneous Q4H  . levothyroxine  50 mcg Per NG tube QAC breakfast  . magnesium oxide  400 mg Per Tube Daily  . mouth rinse  15 mL Mouth Rinse 10 times per day  . mexiletine  300 mg Per Tube Q12H  . pantoprazole sodium  40 mg Per Tube BID  . sodium chloride flush  10-40 mL Intracatheter Q12H  . sotalol  80 mg Per Tube Daily     Infusions: . feeding supplement (GLUCERNA 1.2 CAL) 1,000 mL (06/01/17 1700)  . meropenem (MERREM) IV Stopped (06/01/17 0934)  . norepinephrine (LEVOPHED) Adult infusion 4 mcg/min (06/01/17 1700)  . valproate sodium Stopped (06/01/17 1022)     PRN Medications:  acetaminophen (TYLENOL) oral liquid 160 mg/5 mL, albuterol, docusate, hydrALAZINE, neomycin-bacitracin-polymyxin, ondansetron (ZOFRAN) IV, oxyCODONE, phenol, sodium chloride flush  Assessment/Plan/Discussion:    1. POD 47s/p HVAD for ischemic cardiomyopathy with acute on chronic systolic heart failure, EF 15% with moderate RV dysfunction. Returned to OR for bleeding from raw mediastinal tissues. His VAD parameters have been stable. Levophed weaned down today.   2. Acute mental status changes of unclear etiology. This could be related to infection, medications, hypothyroidism, possibly stroke not see on CT. CT head from 8/7 showed lacunar infarcts that did not appear acute. Seen by neurology. EEG does not show seizures activity. Treating infection and hopefully this will improve.  He is still lethargic but following commands. I think sepsis is probably the most likely cause.  3. Acute respiratory failure: reintubated for airway protection. Gas exchange is ok and CXR looks fairly clear but he is too ill and weak to get off vent at this time. He may need trach.  4. Acute on chronic kidney failure. Creat down slightly today.  Urine output ok. May be related to sepsis, volume depletion.  5. GI bleeding and anemia: does not  appear to be bleeding at this time. His Hgb is improved after transfusion.  6. VT: None since Sotalol started in addition to mexiletine and amiodarone.  7. Fever and Leukocytosis. Nothing has cultured out yet. Fever spike today and repeat BC sent. Sacral pressure sore may be responsible. On Vanc and meropenum.  8. Sacral pressure sore: Reportedly deep to sacrum. WOC has seen and started enzymatic therapy and will reevaluate Monday.   I reviewed the LVAD parameters from today, and compared the results to the patient's prior recorded data. No programming changes were made. The LVAD is functioning within specified parameters. LVAD interrogation was negative for any significant power changes, alarms or PI events/speed drops. LVAD equipment check completed andis in good working order. Back-up equipment present.    Length of Stay: [place] 06/01/2017, 5:53 PM

## 2017-06-01 NOTE — Progress Notes (Signed)
Pharmacy Antibiotic Note  James [name] is a 56 y.o. male being treated for sepsis secondary to sacral wound vs UTI. Pharmacy has been consulted for vancomycin and meropenem dosing. SCr improving over the weekend, but UOP remains marginal. Vancomycin trough today supratherapeutic at 30 mcg/ml.  Plan: -Hold vancomycin -Continue meropenem 1g IV q12h - could consider dose increase given CrCl but will hold off for now -Check vancomycin random level tomorrow at time of next scheduled dose -Monitor SCr, UOP, fever curve, cultures   Height: 6' (182.9 cm) Weight: 224 lb 3.3 oz (101.7 kg) IBW/kg (Calculated) : 77.6  Temp (24hrs), Avg:99.3 F (37.4 C), Min:98.2 F (36.8 C), Max:99.9 F (37.7 C)   Recent Labs Lab 05/28/17 0503 05/29/17 0216 05/29/17 0230 05/30/17 0310 05/30/17 1250 05/31/17 0322 06/01/17 0323 06/01/17 0923  WBC 16.5* 17.0*  --  23.7*  --  26.7* 23.6*  --   CREATININE 2.16* 2.52*  --  2.59* 2.62* 2.47* 1.72*  --   LATICACIDVEN  --   --  1.1  --   --   --   --   --   VANCOTROUGH  --   --   --   --   --   --   --  30*    Estimated Creatinine Clearance: 60.6 mL/min (A) (by C-G formula based on SCr of 1.72 mg/dL (H)).    No Active Allergies  Antimicrobials this admission: 7/5 Vancomycin >>7/8 7/5 Zosyn >> 7/12 8/7 Ceftriaxone >> 7/8 8/8 Vancomycin >> 8/8 Meropenem >>  Dose adjustments this admission: 8/12 VT = 30 - hold vancomycin  Microbiology results: 8/7 UCx: mult spp 8/7 BCx: NGTD 8/8 Tracheal Aspirate: few GPC, rare GPR 8/9 UCx: no growth 8/11 BCx: sent  Thank you for allowing pharmacy to be a part of this patient's care.  Fredonia HighlandMichael Bitonti, PharmD PGY-2 Cardiology Pharmacy Resident Pager: [phone] 06/01/2017

## 2017-06-01 NOTE — Progress Notes (Signed)
Patient ID: James [name], male   DOB: 11/23/1960, 56 y.o.   MRN: [id number]   Advanced Heart Failure VAD Team Note  Subjective:    Events: --HVAD placed 6/28.  Returned to the OR that evening with high chest tube output, evacuation of mediastinal hematoma.  --Extubated 6/29. Milrinone stopped on 7/4. --7/4, patient developed ileus with respiratory compromise. NGT placed with 3 L suctioned out.  CXR with suspicion for aspiration PNA.  Patient had to be intubated.  He went into atrial fibrillation with RVR.  He became hypotensive and was started on norepinephrine and phenylephrine.  Amiodarone gtt begun.    --Extubated again on 7/6.  -- 7/13 Had 2 sustained episodes of VT. Had to be cardioverted 1. Second episode broke with overdrive pacing with Dr. Caryl Comes. VAD speed turned down to 2700.  --Melena due to acuteGI bleed --> 2 units PRBCs 7/14, 2 units 7/15. 3 U PRBCs 7/16,  05/12/2017 3 UPRBCs.  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  He had epinephrine, APC, and 2 clips. Intubated prior to procedure and placed on norepinephrine. Speed dropped to 2660.  --7/18 Back in VT with overdrive pacing. Lidocaine was started in addition to amiodarone. Extubated. --7/19 lidocaine stopped with confusion. Neuro consulted.  EEG normal.  CT of head no acute findings. 7/20 confusion had resolved.  --1 unit PRBCs 7/20.  --7/22, developed sustained VT with rate around 130 shortly after getting IV Lasix.  We were able to pace him out and back to NSR.  He was put back on amiodarone gtt.  --More VT on 7/25, paced out.  Back on IV amiodarone gtt.  NSR.  -- 7/31 Ramp ECHO decrease speed 2600 rpm.  --8/1 VT- paced out --8/2 VT - started on sotalol --8/6 Hgb 7.5, 1 unit PRBCs - 8/7 Hgb 9.5, confused.  Head CT with right superior cerebellar artery lacunar infarcts, not acute. - 8/8 Possible aspiration. Reintubated. - 8/10 possible seizure => Depakote.  CT head no  change.  1 unit PRBCs.   Remains intubated, awake on vent and responds appropriately.  Norepinephrine remains at 5.  CVP 6-7.  Creatinine down to 1.72 today. WBCs 26.7 => 23.6.  Afebrile today.  Continues on vancomycin/meropenem for suspected sepsis.   CXR with pulmonary vascular congestions.  Unable to wean vent yesterday.    HVAD INTERROGATION:  HVAD:  Flow 4.6 liters/min, speed 2600,  power 4 W,  Peak 6.8 Trough 3.5. No alarms. Suction On. Lavare On.  Objective:    Vital Signs:   Temp:  [98.2 F (36.8 C)-101.4 F (38.6 C)] 99.6 F (37.6 C) (08/12 0800) Pulse Rate:  [67-74] 70 (08/12 0800) Resp:  [16-34] 22 (08/12 0800) BP: (67-107)/(51-88) 94/78 (08/12 0800) SpO2:  [96 %-100 %] 99 % (08/12 0800) FiO2 (%):  [40 %] 40 % (08/12 0800) Weight:  [224 lb 3.3 oz (101.7 kg)] 224 lb 3.3 oz (101.7 kg) (08/12 0500) Last BM Date: 05/31/17 Mean arterial Pressure 80s Intake/Output:   Intake/Output Summary (Last 24 hours) at 06/01/17 0813 Last data filed at 06/01/17 0700  Gross per 24 hour  Intake          2829.11 ml  Output              878 ml  Net          1951.11 ml     Physical Exam  CVP 6-7 Physical Exam: GENERAL: Intubated. Opens eyes. HEENT: ETT Cor-track NECK:  Supple, JVP hard to assess with lines but does not appear significantly elevated. No lymphadenopathy or thyromegaly appreciated.  CARDIAC: Mechanical heart sounds with LVAD hum present.  LUNGS: Coarse throughout.  ABDOMEN: Soft, round, nontender, positive bowel sounds x4.  LVAD exit site: well-healed and incorporated. Dressing dry and intact. No erythema or drainage. Stabilization device present and accurately applied. Driveline dressing is being changed daily per sterile technique. EXTREMITIES: Warm and dry, no cyanosis, clubbing, rash. R and LLE 1+ edema to knees. RUE PICC NEUROLOGIC: Intubated follows commands.  Skin: sacrum. Unstageable pressure ulcer   Telemetry   Personally reviewed: v-paced,  underlying atrial rhythm difficult.   Labs   Basic Metabolic Panel:  Recent Labs Lab 05/26/17 0413 05/27/17 8527 05/28/17 0503 05/29/17 0216 05/30/17 0310 05/30/17 1250 05/31/17 0322 06/01/17 0323  NA 137 137 137 140 141 141 143 143  K 4.2 4.2 4.4 4.3 4.1 4.4 4.2 4.4  CL 108 106 108 111 112* 113* 113* 114*  CO2 22 22 20* 19* 20* 19* 21* 22  GLUCOSE 150* 145* 111* 104* 135* 166* 210* 153*  BUN 45* 47* 47* 51* 55* 59* 59* 63*  CREATININE 1.98* 2.04* 2.16* 2.52* 2.59* 2.62* 2.47* 1.72*  CALCIUM 7.7* 8.0* 7.9* 7.6* 7.6* 7.7* 7.8* 7.8*  MG 2.0 1.9 2.1 2.0  --   --  2.0  --     Liver Function Tests: No results for input(s): AST, ALT, ALKPHOS, BILITOT, PROT, ALBUMIN in the last 168 hours. No results for input(s): LIPASE, AMYLASE in the last 168 hours.  Recent Labs Lab 05/27/17 0800  AMMONIA 20    CBC:  Recent Labs Lab 05/26/17 0413  05/28/17 0503 05/29/17 0216 05/30/17 0310 05/31/17 0322 06/01/17 0323  WBC 17.8*  < > 16.5* 17.0* 23.7* 26.7* 23.6*  NEUTROABS 15.8*  --   --   --   --   --   --   HGB 7.5*  < > 9.6* 8.5* 8.5* 10.0* 10.0*  HCT 23.4*  < > 30.2* 26.2* 26.4* 30.5* 30.7*  MCV 90.3  < > 88.8 87.9 88.6 88.2 87.5  PLT 293  < > 300 243 250 305 295  < > = values in this interval not displayed.  INR:  Recent Labs Lab 05/28/17 0503 05/29/17 0216 05/30/17 0310 05/31/17 0322 06/01/17 0323  INR 2.09 2.90 3.15 2.74 2.25    Other results:     Imaging   Ct Head Wo Contrast  Result Date: 05/30/2017 CLINICAL DATA:  Altered mental status. Seizure like activity. History of AICD, diabetes, hypertension. EXAM: CT HEAD WITHOUT CONTRAST TECHNIQUE: Contiguous axial images were obtained from the base of the skull through the vertex without intravenous contrast. COMPARISON:  CT HEAD May 27, 2017 FINDINGS: BRAIN: No intraparenchymal hemorrhage, mass effect nor midline shift. Mild ventriculomegaly on the basis of global parenchymal brain volume loss, stable from  prior imaging. Patient's known RIGHT cerebellar infarcts not apparent on this examination, likely due to slice selection. Old small LEFT cerebellar infarct. Patchy supratentorial and RIGHT pontine white matter hypodensities. No acute large vascular territory infarcts. Similar prominent bifrontal low-density prominent extra-axial spaces without mass effect. Basal cisterns are patent. VASCULAR: Moderate calcific atherosclerosis of the carotid siphons. SKULL: No skull fracture. No significant scalp soft tissue swelling. SINUSES/ORBITS: Nasogastric tube via the RIGHT nares. Mild LEFT maxillary sinus mucosal thickening in atresia compatible with chronic sinusitis. Trace bilateral mastoid effusions. Subcentimeter LEFT external auditory canal osteoma.The included ocular globes and orbital contents are non-suspicious. OTHER: None.  IMPRESSION: 1. No acute intracranial process. 2. 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 Electronically Signed   By: Elon Alas M.D.   On: 05/30/2017 23:27   Dg Chest Port 1 View  Result Date: 06/01/2017 CLINICAL DATA:  Acute respiratory failure with hypoxia. EXAM: PORTABLE CHEST 1 VIEW COMPARISON:  One-view chest x-ray 05/31/2017 FINDINGS: Endotracheal tube is stable, 4 cm above the carina. A small bore feeding tube courses off the inferior border of the film. Left ventricular assist device is in place. Lung volumes are low. Mild pulmonary vascular congestion has increased. IMPRESSION: 1. Slight increase in mild pulmonary vascular congestion. 2. Support apparatus is stable. Electronically Signed   By: San Morelle M.D.   On: 06/01/2017 07:26   Dg Chest Port 1 View  Result Date: 05/31/2017 CLINICAL DATA:  Endotracheal tube present. Left ventricular assist device. EXAM: PORTABLE CHEST 1 VIEW COMPARISON:  One-view chest x-ray 05/30/2017. FINDINGS: The endotracheal tube is stable and in satisfactory  position. A small bore feeding tube is in place. A right-sided PICC line is stable. AICD and left ventricular assist device are stable. The heart is enlarged. Aeration is slightly improved. There is no edema or effusion. No focal airspace disease is present. IMPRESSION: 1. Support apparatus including endotracheal tube and left ventricular assist device for stable in position. 2. Stable cardiomegaly without failure. Electronically Signed   By: San Morelle M.D.   On: 05/31/2017 07:44     Medications:     Scheduled Medications: . amiodarone  200 mg Per Tube BID  . chlorhexidine gluconate (MEDLINE KIT)  15 mL Mouth Rinse BID  . Chlorhexidine Gluconate Cloth  6 each Topical Q0600  . collagenase   Topical Daily  . febuxostat  120 mg Oral Daily  . insulin aspart  0-15 Units Subcutaneous Q4H  . levothyroxine  50 mcg Per NG tube QAC breakfast  . magnesium oxide  400 mg Per Tube Daily  . mouth rinse  15 mL Mouth Rinse 10 times per day  . mexiletine  300 mg Per Tube Q12H  . pantoprazole sodium  40 mg Per Tube BID  . sodium chloride flush  10-40 mL Intracatheter Q12H  . sotalol  80 mg Per Tube Daily    Infusions: . albumin human Stopped (06/01/17 0300)  . feeding supplement (GLUCERNA 1.2 CAL) 1,000 mL (06/01/17 0700)  . meropenem (MERREM) IV Stopped (05/31/17 2230)  . norepinephrine (LEVOPHED) Adult infusion 5 mcg/min (06/01/17 0700)  . valproate sodium Stopped (05/31/17 2300)  . vancomycin Stopped (05/31/17 1041)    PRN Medications: acetaminophen (TYLENOL) oral liquid 160 mg/5 mL, albuterol, docusate, hydrALAZINE, neomycin-bacitracin-polymyxin, ondansetron (ZOFRAN) IV, oxyCODONE, phenol, sodium chloride flush   Patient Profile   56 yo with CAD s/p CABG, ischemic cardiomyopathy/chronic systolic CHF, tophaceous gout, and CKD stage 3 was admitted for diuresis and consideration for LVAD placement. S/p HVAD on 6/28  Assessment/Plan:    1. Acute/chronic systolic CHF s/p HVAD:  Ischemic cardiomyopathy.  St Jude ICD.  Echo (6/18) with EF 15%, mildly dilated RV with moderately decreased systolic function.  s/p HVAD placement 6/28 and had to return to OR to evacuate mediastinal hematoma.  He had been weaned off pressors/milrinone, but developed ileus w/ likely aspiration event 7/4, re-intubated, developed afib/RVR requiring norepinephrine. Extubated 7/6.  VAD speed turned down to 2600 with VT. VAD parameters currently look good.  Milrinone stopped 7/17 with recurrent VT.  He was started back on norepinephrine 8/9  due to soft BP after intubation and suspected septic shock.  Now norepinephrine 5, MAP 80s, CVP 6-7, co-ox 77%.    - Suspect we can wean down norepinephrine today.   - Anti-hypertensives on hold with suspected septic shock.  - Hold diuretics today with CVP 6-7.  - Off ASA with GI Bleed  - Goal INR 2-2.5. Today's INR 2.25.     2. AKI on CKD stage 3: Creatinine decreasing today with stabilization of BP/sepsis. 3. Symptomatic anemia due to acute UGI bleeding:  Received 3 units PRBCs 7/16 and 3 units PRBCs on 05/13/2017. 1 unit PRBCs 7/20. 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  Requiring 2 clips + epi + APC. 1 unit PRBCs 8/6.  He had 1 unit PRBCs 8/10 to provide volume.  Hemoglobin up appropriately.  - Got Feraheme 7/28.  - Continue Protonix 40 po bid. - Off aspirin for now.  - Transfuse hgb < 8 4. Ventricular tachycardia: Status post ventricular tachycardia 2 on 7/13. Possible suction event. VAD speed turned down to 2700.  Recurrent VT 7/17. EP called to bedside. Overdrive pacing successful for about 10 minutes but VT recurred.  Milrinone turned off, remained in VT overnight but hemodynamically stable.  Speed decreased to 2600.  On 7/18, we were able to pace him out of VT.  Lidocaine added then stopped due to confusion/mental status changes.  He had VT 7/22 about 30 minutes after getting IV Lasix, had to be paced out again => may have  been due to rapid fluid shift.  Pt paced out of VT 3 separate occasions 7/25. Got amio bolus x 2 and started back on IV infusion.  Transitioned to amiodarone 400 mg tid.  On 8/1 had recurrent VT and paced out again.  Recurrent VT 8/2 per EP sotalol started along with amiodarone, sotalol decreased to once daily with bradycardia.   - No VT over night. Continue mexiletine to 300 mg BID, sotalol 80 mg daily, amio 200 mg BID . 5. CAD: s/p CABG 2012. No S/S ischemia.  Off aspirin due to GI bleeding. No change.   6. Gout: Severe tophaceous gout. Left shoulder pain ?gout.     - Continue uloric. No change.  7. Atrial fibrillation: Developed atrial fibrillation with RVR in setting of aspiration PNA on 7/4.    Hard to know with interference from [place], he is now v-pacing and cannot tell underlying atrial rhythm.   8. Malnutrition: Continue tube feeds via Cor-track. Nutrition following.  9. Aspiration PNA with acute hypoxemic respiratory failure on 7/4: He was extubated on 7/6. Tracheal aspirate with Klebsiella and citrobacter. Both sensitive to Zosyn. Completed abx 7/13. Possible aspiration 8/8.    - On vanco/meropenum.    10. Ileus: Resolved.  11. Deconditioning:  Plan for CIR when stable. OT/PT appreciated. CIR on hold. Reconsult once extubated.   12. Acute Respiratory Failure: Intubated 7/17 in setting of complex GI intervention. Extubated 7/18. Re-intubated 8/8.  Weak respiratory muscles, concern that he could end up requiring tracheostomy.  13. Delirium: Resolved. No further. Appreciate neuro input Possibly related to lidocaine.  Lidocaine stopped. CT of head negative. EEG ok. No further workup per neuro. Neuro  reconsulted 8/7 with AMS => ?related to fever/infection.  He is clear on vent this morning.  - CT head 05/27/17 and 8/10 with lacunar infarcts but do not appear acute.   - Seizure activity versus myoclonus => Depakote started.    15. Insomnia: On remeron for  sleep and depression. 16. Post-op  depression: Remeron started 7/28.  17. UTI: on vancomycin and meropenum. Urine culture- NGTD.  18. Unstageable Pressure Ulcer: Sacrum. Consult WOC Needs to start santyl daily to enzymatically debride. Reposition R to L. WOC recommendations appreciated.   19. Bradycardia: With hypotension.  Currently back-up pacing set at 70 bpm.  Does not have atrial lead so pacing asynchronously.  Will discuss with EP turning back up pacing rate down some on Monday.  20. Hypothyroidism: May be related to amiodarone, he is on Levoxyl.  21. ID: Suspected septic shock, source = pressure ulcer versus UTI.  Afebrile now, WBCs coming down, BP improving.  Continue meropenem/vancomycin.   I reviewed the HVAD parameters from today, and compared the results to the patient's prior recorded data.  No programming changes were made.  The HVAD is functioning within specified parameters.    CRITICAL CARE Performed by: Loralie Champagne  Total critical care time: 40 minutes  Critical care time was exclusive of separately billable procedures and treating other patients.  Critical care was necessary to treat or prevent imminent or life-threatening deterioration.  Critical care was time spent personally by me on the following activities: development of treatment plan with patient and/or surrogate as well as nursing, discussions with consultants, evaluation of patient's response to treatment, examination of patient, obtaining history from patient or surrogate, ordering and performing treatments and interventions, ordering and review of laboratory studies, ordering and review of radiographic studies, pulse oximetry and re-evaluation of patient's condition.  Loralie Champagne, MD 06/01/2017, 8:13 AM  VAD Team --- VAD ISSUES ONLY--- Pager [phone] (7am - 7am) Advanced Heart Failure Team  Pager [phone] (M-F; 7a - 4p)  Please contact [facility] for night-coverage after hours (4p -7a ) and weekends on [email or web address]

## 2017-06-02 LAB — DIFFERENTIAL
BASOS PCT: 0 %
Basophils Absolute: 0 10*3/uL (ref 0.0–0.1)
EOS PCT: 1 %
Eosinophils Absolute: 0.3 10*3/uL (ref 0.0–0.7)
Lymphocytes Relative: 3 %
Lymphs Abs: 0.6 10*3/uL — ABNORMAL LOW (ref 0.7–4.0)
MONO ABS: 1.4 10*3/uL — AB (ref 0.1–1.0)
Monocytes Relative: 6 %
NEUTROS PCT: 90 %
Neutro Abs: 20 10*3/uL — ABNORMAL HIGH (ref 1.7–7.7)

## 2017-06-02 LAB — CBC
HCT: 28.8 % — ABNORMAL LOW (ref 39.0–52.0)
Hemoglobin: 9.3 g/dL — ABNORMAL LOW (ref 13.0–17.0)
MCH: 28.4 pg (ref 26.0–34.0)
MCHC: 32.3 g/dL (ref 30.0–36.0)
MCV: 87.8 fL (ref 78.0–100.0)
PLATELETS: 260 10*3/uL (ref 150–400)
RBC: 3.28 MIL/uL — ABNORMAL LOW (ref 4.22–5.81)
RDW: 18.1 % — AB (ref 11.5–15.5)
WBC: 22.2 10*3/uL — ABNORMAL HIGH (ref 4.0–10.5)

## 2017-06-02 LAB — COOXEMETRY PANEL
Carboxyhemoglobin: 1 % (ref 0.5–1.5)
Methemoglobin: 1.3 % (ref 0.0–1.5)
O2 SAT: 77.2 %
TOTAL HEMOGLOBIN: 9.8 g/dL — AB (ref 12.0–16.0)

## 2017-06-02 LAB — GLUCOSE, CAPILLARY
GLUCOSE-CAPILLARY: 164 mg/dL — AB (ref 65–99)
GLUCOSE-CAPILLARY: 199 mg/dL — AB (ref 65–99)
GLUCOSE-CAPILLARY: 125 mg/dL — AB (ref 65–99)
GLUCOSE-CAPILLARY: 189 mg/dL — AB (ref 65–99)
Glucose-Capillary: 213 mg/dL — ABNORMAL HIGH (ref 65–99)
Glucose-Capillary: 183 mg/dL — ABNORMAL HIGH (ref 65–99)
Glucose-Capillary: 106 mg/dL — ABNORMAL HIGH (ref 65–99)
Glucose-Capillary: 185 mg/dL — ABNORMAL HIGH (ref 65–99)

## 2017-06-02 LAB — PROTIME-INR
INR: 2.09
PROTHROMBIN TIME: 23.8 s — AB (ref 11.4–15.2)

## 2017-06-02 LAB — BASIC METABOLIC PANEL
ANION GAP: 6 (ref 5–15)
BUN: 65 mg/dL — ABNORMAL HIGH (ref 6–20)
CALCIUM: 7.6 mg/dL — AB (ref 8.9–10.3)
CO2: 24 mmol/L (ref 22–32)
CREATININE: 2.43 mg/dL — AB (ref 0.61–1.24)
Chloride: 114 mmol/L — ABNORMAL HIGH (ref 101–111)
GFR, EST AFRICAN AMERICAN: 33 mL/min — AB (ref >60)
GFR, EST NON AFRICAN AMERICAN: 29 mL/min — AB (ref >60)
GLUCOSE: 132 mg/dL — AB (ref 65–99)
Potassium: 4.4 mmol/L (ref 3.5–5.1)
Sodium: 144 mmol/L (ref 135–145)

## 2017-06-02 LAB — LACTATE DEHYDROGENASE: LDH: 146 U/L (ref 98–192)

## 2017-06-02 LAB — VANCOMYCIN, RANDOM: Vancomycin Rm: 30

## 2017-06-02 MED ORDER — FUROSEMIDE 10 MG/ML IJ SOLN
40.0000 mg | Freq: Once | INTRAMUSCULAR | Status: AC
  Administered 2017-06-02: 40 mg via INTRAVENOUS
  Filled 2017-06-02: qty 4

## 2017-06-02 MED ORDER — COLCHICINE 0.6 MG PO TABS
0.6000 mg | ORAL_TABLET | Freq: Every day | ORAL | Status: DC
  Administered 2017-06-03 – 2017-06-12 (×10): 0.6 mg via ORAL
  Filled 2017-06-02 (×10): qty 1

## 2017-06-02 MED ORDER — COLCHICINE 0.6 MG PO TABS
1.2000 mg | ORAL_TABLET | Freq: Once | ORAL | Status: AC
  Administered 2017-06-02: 1.2 mg via ORAL
  Filled 2017-06-02: qty 2

## 2017-06-02 NOTE — Progress Notes (Signed)
Patient ID: James [name], male   DOB: 1961/04/08, 56 y.o.   MRN: [id number]  This NP visited patient at the bedside as a follow up for emotional support and pallaitive needs.  Patient remains intubated and with many medical setbacks prohibiting him from advancing to his goal of  successful LVAD implant  enabling him to move  forward to the quality life he was hopeful for.  Sat and talked with wife/[name].  She is very tearful today as she processes this difficult situation and likely long-term poor prognosis.  She shares with me that they are now contemplating the possibility of the need for a trach.    Dr Shirlee LatchMcLean and Kirt BoysMolly RN with LVAD team joined conversation.  We discussed the complexity of the situation specific to his overall weakness and dibility, unstageable pressure injury on his sacrum, poor nutritional status and specifically his inability to successfully wean off the ventilator.   We discussed the importance of contemplating trach prior to extubation.  Dr. Jamison NeighborNestor detailed this information  this morning with patient's wife.  We discussed the difference between an aggressive medical intervention path and a palliative comfort path for this patient at this time in this situation.  I personally spoke with Dr. Jamison NeighborNestor and he agrees to stay the current course over the next 2 days,  continue to attempt to successfully wean patient from the ventilator, and family understands that if patient is not successfully weaned  from the ventilator in that time frame,  decision to trach the patient versus one-way extubation will need to be made at that time.  Emotional support offered.  [facility]  Discussed with family the importance of continued conversation  their  medical providers regarding overall plan of care and treatment options,  ensuring decisions are within the context of the patients values and GOCs.  Questions and concerns addressed  Time in   1300        Time out  1400      Greater than 50% of the time was spent in counseling and coordination of care  Lorinda CreedMary Stokely Jeancharles NP  Palliative Medicine Team Team Phone # [phone] Pager [phone]

## 2017-06-02 NOTE — Progress Notes (Signed)
CSW met with wife at bedside. Wife shared conversations with MD's today regarding current medical state and need for possible trach. Wife tearful stated she has been trying to have conversations with patient regarding his wishes. She stated that he mentioned before VAD surgery that he did not want life support measures but feels this decision is more complicated due to current medical state. Wife asked appropriate questions to patient and plans to discuss more with family. Wife appears to be conflicted with possibility of decision for trach. CSW provided supportive intervention and will continue to follow as needed. CSW also discussed case and needs with Nyra Market, NP from Palliative Care. Raquel Sarna, [place], [place]

## 2017-06-02 NOTE — Progress Notes (Signed)
PULMONARY / CRITICAL CARE MEDICINE   Name: James [name] MRN: [id number] DOB: 22-Feb-1961    ADMISSION DATE:  03/25/2017 CONSULTATION DATE:  04/23/2017  REFERRING MD:  Laneta Simmers - CVTS  CHIEF COMPLAINT:  Acute respiratory failure  BRIEF SUMMARY:   [name] with CHF (EF 15%), AICD, CAD, Gout, HTN, CKD, DM, Anemia, admitted 04/15/2017 for VAD placement, he had already been turned down by [facility] for transplant due to his gout and limitations with his mobility. Now s/p HVAD placement for his ischemic cardiomyopathy. His course has been complicated by AKI-on-CKD, and also complicated by pre-op Afib with RVR. Developed acute hypoxic respiratory failure requiring 15L O2 NRB, and he developed worsening respiratory distress, prompting ICU consult.  He was intubated 7/5 for respiratory arrest, aspiration PNA > developed septic shock / required vasopressors.  Extubated 7/6.  He was reintubated again on 7/17 when he decompensated during EGD. He was extubated on 7/18.  Hospital course complicated by recurrent episodes of SVT, SVT, GI bleed. On the morning of 8/8 he became more somnolent with altered mental status, increased work of breathing. NG tube was noted to be displaced with likely aspiration. PCCM consulted for help with management.  SUBJECTIVE:  Afebrile, WBC 22.2.  Note fever 8/12 to 103 and new gout flare in hands.  Pt weaning on 10/5.    VITAL SIGNS: BP 92/69   Pulse 70   Temp 98.9 F (37.2 C) (Oral)   Resp (!) 21   Ht 6' (1.829 m)   Wt 224 lb 10.4 oz (101.9 kg)   SpO2 96%   BMI 30.47 kg/m   HEMODYNAMICS: CVP:  [7 mmHg-8 mmHg] 8 mmHg  VENTILATOR SETTINGS: Vent Mode: PRVC FiO2 (%):  [40 %] 40 % Set Rate:  [14 bmp] 14 bmp Vt Set:  [620 mL] 620 mL PEEP:  [5 cmH20] 5 cmH20 Plateau Pressure:  [16 cmH20-18 cmH20] 18 cmH20  INTAKE / OUTPUT: I/O last 3 completed shifts: In: 3752.6 [I.V.:172.6; Other:40; NG/GT:2830; IV Piggyback:710] Out: 1191 [Urine:991; Stool:200]  PHYSICAL  EXAMINATION: General: cachectic male in NAD on vent HEENT: MM pink/moist, ETT Neuro: Awake, alert, follows commands  CV: VAD hum, SR on monitor  PULM: even/non-labored, lungs bilaterally clear  WU:JWJX, non-tender, bsx4 active  Extremities: warm/dry, 1+ generalized edema  Skin: no rashes or lesions   LABS:  BMET  Recent Labs Lab 05/31/17 0322 06/01/17 0323 06/02/17 0321  NA 143 143 144  K 4.2 4.4 4.4  CL 113* 114* 114*  CO2 21* 22 24  BUN 59* 63* 65*  CREATININE 2.47* 1.72* 2.43*  GLUCOSE 210* 153* 132*   Electrolytes  Recent Labs Lab 05/28/17 0503 05/29/17 0216  05/31/17 0322 06/01/17 0323 06/02/17 0321  CALCIUM 7.9* 7.6*  < > 7.8* 7.8* 7.6*  MG 2.1 2.0  --  2.0  --   --   < > = values in this interval not displayed. CBC  Recent Labs Lab 05/31/17 0322 06/01/17 0323 06/02/17 0321  WBC 26.7* 23.6* 22.2*  HGB 10.0* 10.0* 9.3*  HCT 30.5* 30.7* 28.8*  PLT 305 295 260   Coag's  Recent Labs Lab 05/31/17 0322 06/01/17 0323 06/02/17 0321  INR 2.74 2.25 2.09   Sepsis Markers  Recent Labs Lab 05/28/17 1017 05/29/17 0216 05/29/17 0230 05/30/17 0310  LATICACIDVEN  --   --  1.1  --   PROCALCITON 0.99 1.49  --  1.33   ABG  Recent Labs Lab 05/28/17 1132  PHART 7.352  PCO2ART 37.6  PO2ART  360.0*   Liver Enzymes No results for input(s): AST, ALT, ALKPHOS, BILITOT, ALBUMIN in the last 168 hours.  Cardiac Enzymes No results for input(s): TROPONINI, PROBNP in the last 168 hours.  Glucose  Recent Labs Lab 06/01/17 1139 06/01/17 1547 06/01/17 2109 06/01/17 2319 06/02/17 0349 06/02/17 0816  GLUCAP 193* 213* 199* 183* 125* 106*    Imaging No results found.   STUDIES:  CT Head 8/7 >> small bilateral subdural hygromas or hematomas, but not significantly changed since July and clinical significance unclear.  Right superior cerebellar artery lacunar infarcts have become apparent since 05/08/2017, but have a chronic appearance  today.  CULTURES: Blood 7/5 >> neg Sputum 7/5 >>klebsieall, citrobacter Sputum 7/7 >> klebsiella - resistant to amp otherwise sens [facility] 8/7 >>NEG  UC 8/7 >> negative  Sputum 8/8 >> multiple organisms, none predominant BCx2 8/11 >>  BCx2 8/12 >>  Sputum 8/11 >>   ANTIBIOTICS: Vancomycin 7/5 >> 7/7; 8/8 >> Zosyn 7/5 >> 7/13 Meropenem 8/8 >>  SIGNIFICANT EVENTS: 7/05  Respiratory arrest post feculent vomitus episode 7/06  Extubated  7/17  Intubated during EGD 7/18  Extubated 8/06  Resp distress, aspirated. Intubated 8/12  Fever to 103, repeat cultures  LINES/TUBES: ETT 7/5 >> 7/6, 7/17-7/18 Double lumen PICC on the right 7/2 >>  ASSESSMENT / PLAN: [name] with CHF (EF 15%), AICD, CAD, Gout, HTN, CKD, DM, Anemia, admitted 03/22/2017 for VAD placement, now s/p HVAD placement, with course complicated by AKI-on-CKD and pre-op Afib with RVR, GI bleed recurrent arrhythmia.  Decompensated in setting of aspiration on 8/8 requiring intubation.   PULMONARY A: Acute hypoxic respiratory failure requiring intubation 8/8 - likely secondary to aspiration of tube feeds  P: PRVC 8 cc/kg  Daily SBT / WUA  > Card's ok with extubation when he meets criteria  Trend CXR  PRN albuterol  ABX / cultures as above Hopeful to avoid trach but given overall deconditioning, he may benefit in the short term    Acute on Chronic Systolic CHF / ICM - [employer] ICD, turned down for transplant S/p HVAD Atrial Fibrillation - TSH 9.45 on 8/7 Reccurent VT Mediastinal Hematoma s/p Evacuation post LVAD CHF (EF 15%); CAD; History HTN AKI on CKD stage 3 P: ICU monitoring  Recommendations per CVTS / CHF service Continue synthroid, recheck TSH in 6 weeks (9/21)   Protein calorie malnutrition  P: TF per nutrition  Push caloric / protein intake as able    Gout Flare  P: Colchicine initiated 8/13   Family - wife updated at bedside 8/13 on NP rounding  CC Time: 30 minutes     Canary BrimBrandi Antiono Ettinger,  NP-C  Pulmonary & Critical Care Pgr: [phone] or if no answer [phone] 06/02/2017, 9:29 AM

## 2017-06-02 NOTE — Progress Notes (Signed)
Pharmacy Antibiotic Note  James FabianHarry D [name] is a 56 y.o. male being treated for sepsis secondary to sacral wound vs UTI. Pharmacy has been consulted for vancomycin and meropenem dosing.   SCr back up to 2.4 today, UOP remains marginal.   Vancomycin trough was 30 yesterday, infusion stopped early after alerting RN. Random level checking this morning was still elevated at 30.   Tmax 103 overnight, wbc still elevated at 22. Will continue to hold vancomycin and follow daily random levels with am labs. Patient recultured, no growth in 8/11 cultures.  Discussed plan with HF team, also MD noted rash, patient does not have any PCN allergies listed and has received several related antibiotics including zosyn and ceftriaxone with no noted complications. Will follow closely for now and consider changing to FQ or aztreonam if rash worsens. Also would need to consider depakote as cause of rash, started 8/10 for possible seizure?   INR trending down slowly to 2.0 this morning, hgb down 10>9.3, LDH stable, Pltc ok. Plan to start heparin when INR<1.8  Plan: -Hold vancomycin for now, follow random levels for now -Continue meropenem 1g IV q12h  -Monitor SCr, UOP, fever curve, cultures -Likely able to start heparin in am   Height: 6' (182.9 cm) Weight: 224 lb 10.4 oz (101.9 kg) IBW/kg (Calculated) : 77.6  Temp (24hrs), Avg:99.5 F (37.5 C), Min:98.4 F (36.9 C), Max:102.8 F (39.3 C)   Recent Labs Lab 05/29/17 0216 05/29/17 0230 05/30/17 0310 05/30/17 1250 05/31/17 0322 06/01/17 0323 06/01/17 0923 06/02/17 0321 06/02/17 1048  WBC 17.0*  --  23.7*  --  26.7* 23.6*  --  22.2*  --   CREATININE 2.52*  --  2.59* 2.62* 2.47* 1.72*  --  2.43*  --   LATICACIDVEN  --  1.1  --   --   --   --   --   --   --   VANCOTROUGH  --   --   --   --   --   --  30*  --   --   VANCORANDOM  --   --   --   --   --   --   --   --  30    Estimated Creatinine Clearance: 42.9 mL/min (A) (by C-G formula based on SCr of  2.43 mg/dL (H)).    No Active Allergies  Antimicrobials this admission: 7/5 Vancomycin >>7/8 7/5 Zosyn >> 7/12 8/7 Ceftriaxone >> 7/8 8/8 Vancomycin >> 8/8 Meropenem >>  Dose adjustments this admission: 8/12 VT = 30 - hold vancomycin 8/13 VR = 30 - continue to hold  Microbiology results: 8/7 UCx: mult spp 8/7 BCx: NGTD 8/8 Tracheal Aspirate: few GPC, rare GPR 8/9 UCx: no growth 8/11 BCx: ngtd 8/12 bld x2>>sent   Thank you for allowing pharmacy to be a part of this patient's care.  Sheppard CoilFrank Wilson PharmD., BCPS Clinical Pharmacist Pager [phone] 06/02/2017 12:38 PM

## 2017-06-02 NOTE — Progress Notes (Signed)
Patient ID: James [name], male   DOB: 05-Nov-1960, 56 y.o.   MRN: [id number]   Advanced Heart Failure VAD Team Note  Subjective:    Events: --HVAD placed 6/28.  Returned to the OR that evening with high chest tube output, evacuation of mediastinal hematoma.  --Extubated 6/29. Milrinone stopped on 7/4. --7/4, patient developed ileus with respiratory compromise. NGT placed with 3 L suctioned out.  CXR with suspicion for aspiration PNA.  Patient had to be intubated.  He went into atrial fibrillation with RVR.  He became hypotensive and was started on norepinephrine and phenylephrine.  Amiodarone gtt begun.    --Extubated again on 7/6.  -- 7/13 Had 2 sustained episodes of VT. Had to be cardioverted 1. Second episode broke with overdrive pacing with Dr. Caryl Comes. VAD speed turned down to 2700.  --Melena due to acuteGI bleed --> 2 units PRBCs 7/14, 2 units 7/15. 3 U PRBCs 7/16,  05/07/2017 3 UPRBCs.  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  He had epinephrine, APC, and 2 clips. Intubated prior to procedure and placed on norepinephrine. Speed dropped to 2660.  --7/18 Back in VT with overdrive pacing. Lidocaine was started in addition to amiodarone. Extubated. --7/19 lidocaine stopped with confusion. Neuro consulted.  EEG normal.  CT of head no acute findings. 7/20 confusion had resolved.  --1 unit PRBCs 7/20.  --7/22, developed sustained VT with rate around 130 shortly after getting IV Lasix.  We were able to pace him out and back to NSR.  He was put back on amiodarone gtt.  --More VT on 7/25, paced out.  Back on IV amiodarone gtt.  NSR.  -- 7/31 Ramp ECHO decrease speed 2600 rpm.  -- 8/1 VT- paced out -- 8/2 VT - started on sotalol -- 8/6 Hgb 7.5, 1 unit PRBCs -- 8/7 Hgb 9.5, confused.  Head CT with right superior cerebellar artery lacunar infarcts, not acute. -- 8/8 Possible aspiration. Reintubated. -- 8/10 possible seizure => Depakote.  CT head no  change.  1 unit PRBCs.   Remains intubated, awake on vent and responds appropriately.  Norepinephrine at 4.  CVP 7.  Creatinine 2.43 (looking at trend, suspect creatinine yesterday was likely spurious). WBCs 26.7 => 23.6 => 22.  Afebrile today but temp to 103 at noon yesterday.  Continues on vancomycin/meropenem for suspected sepsis, cultures still negative.  Has faint erythematous rash abdomen and thighs.   Unable to wean vent yesterday.    HVAD INTERROGATION:  HVAD:  Flow 4.7 liters/min, speed 2600,  power 4 W,  Peak 6.8 Trough 3.5. No alarms. Suction On. Lavare On.  Objective:    Vital Signs:   Temp:  [98.4 F (36.9 C)-103 F (39.4 C)] 99.1 F (37.3 C) (08/13 0331) Pulse Rate:  [68-80] 70 (08/13 0700) Resp:  [15-28] 20 (08/13 0700) BP: (80-115)/(57-96) 104/68 (08/13 0700) SpO2:  [95 %-100 %] 100 % (08/13 0700) FiO2 (%):  [40 %] 40 % (08/13 0331) Weight:  [224 lb 10.4 oz (101.9 kg)] 224 lb 10.4 oz (101.9 kg) (08/13 0600) Last BM Date: 06/01/17 Mean arterial Pressure 70s-80s Intake/Output:   Intake/Output Summary (Last 24 hours) at 06/02/17 0753 Last data filed at 06/02/17 0600  Gross per 24 hour  Intake          2332.13 ml  Output              711 ml  Net  1621.13 ml     Physical Exam  CVP 7 Physical Exam: GENERAL: Intubated. Opens eyes. HEENT: ETT Cor-track NECK: Supple, JVP 9-10. No lymphadenopathy or thyromegaly appreciated.  CARDIAC: Mechanical heart sounds with LVAD hum present.  LUNGS: Rhonchi bilaterally  ABDOMEN: Soft, round, nontender, positive bowel sounds x4.  LVAD exit site: well-healed and incorporated. Dressing dry and intact. No erythema or drainage. Stabilization device present and accurately applied. Driveline dressing is being changed daily per sterile technique. EXTREMITIES: Warm and dry, no cyanosis, clubbing, rash. R and LLE 1+ edema to knees. RUE PICC NEUROLOGIC: Intubated follows commands.  Skin: sacrum with unstageable  pressure ulcer. Faint erythematous rash abdomen and thighs.   Telemetry   Personally reviewed: ?atrial fibrillation with occasional v-pacing.   Labs   Basic Metabolic Panel:  Recent Labs Lab 05/27/17 0419 05/28/17 0503 05/29/17 0216 05/30/17 0310 05/30/17 1250 05/31/17 0322 06/01/17 0323 06/02/17 0321  NA 137 137 140 141 141 143 143 144  K 4.2 4.4 4.3 4.1 4.4 4.2 4.4 4.4  CL 106 108 111 112* 113* 113* 114* 114*  CO2 22 20* 19* 20* 19* 21* 22 24  GLUCOSE 145* 111* 104* 135* 166* 210* 153* 132*  BUN 47* 47* 51* 55* 59* 59* 63* 65*  CREATININE 2.04* 2.16* 2.52* 2.59* 2.62* 2.47* 1.72* 2.43*  CALCIUM 8.0* 7.9* 7.6* 7.6* 7.7* 7.8* 7.8* 7.6*  MG 1.9 2.1 2.0  --   --  2.0  --   --     Liver Function Tests: No results for input(s): AST, ALT, ALKPHOS, BILITOT, PROT, ALBUMIN in the last 168 hours. No results for input(s): LIPASE, AMYLASE in the last 168 hours.  Recent Labs Lab 05/27/17 0800  AMMONIA 20    CBC:  Recent Labs Lab 05/29/17 0216 05/30/17 0310 05/31/17 0322 06/01/17 0323 06/02/17 0321  WBC 17.0* 23.7* 26.7* 23.6* 22.2*  NEUTROABS  --   --   --   --  20.0*  HGB 8.5* 8.5* 10.0* 10.0* 9.3*  HCT 26.2* 26.4* 30.5* 30.7* 28.8*  MCV 87.9 88.6 88.2 87.5 87.8  PLT 243 250 305 295 260    INR:  Recent Labs Lab 05/29/17 0216 05/30/17 0310 05/31/17 0322 06/01/17 0323 06/02/17 0321  INR 2.90 3.15 2.74 2.25 2.09    Other results:     Imaging   Dg Chest Port 1 View  Result Date: 06/01/2017 CLINICAL DATA:  Acute respiratory failure with hypoxia. EXAM: PORTABLE CHEST 1 VIEW COMPARISON:  One-view chest x-ray 05/31/2017 FINDINGS: Endotracheal tube is stable, 4 cm above the carina. A small bore feeding tube courses off the inferior border of the film. Left ventricular assist device is in place. Lung volumes are low. Mild pulmonary vascular congestion has increased. IMPRESSION: 1. Slight increase in mild pulmonary vascular congestion. 2. Support apparatus is  stable. Electronically Signed   By: San Morelle M.D.   On: 06/01/2017 07:26     Medications:     Scheduled Medications: . amiodarone  200 mg Per Tube BID  . chlorhexidine gluconate (MEDLINE KIT)  15 mL Mouth Rinse BID  . Chlorhexidine Gluconate Cloth  6 each Topical Q0600  . collagenase   Topical Daily  . febuxostat  120 mg Oral Daily  . furosemide  40 mg Intravenous Once  . insulin aspart  0-15 Units Subcutaneous Q4H  . levothyroxine  50 mcg Per NG tube QAC breakfast  . magnesium oxide  400 mg Per Tube Daily  . mouth rinse  15 mL Mouth Rinse  10 times per day  . mexiletine  300 mg Per Tube Q12H  . pantoprazole sodium  40 mg Per Tube BID  . sodium chloride flush  10-40 mL Intracatheter Q12H  . sotalol  80 mg Per Tube Daily    Infusions: . feeding supplement (GLUCERNA 1.2 CAL) 1,000 mL (06/02/17 0600)  . meropenem (MERREM) IV Stopped (06/01/17 2225)  . norepinephrine (LEVOPHED) Adult infusion 3 mcg/min (06/02/17 0745)  . valproate sodium Stopped (06/01/17 2255)    PRN Medications: acetaminophen (TYLENOL) oral liquid 160 mg/5 mL, albuterol, docusate, hydrALAZINE, neomycin-bacitracin-polymyxin, ondansetron (ZOFRAN) IV, oxyCODONE, phenol, sodium chloride flush   Patient Profile   56 yo with CAD s/p CABG, ischemic cardiomyopathy/chronic systolic CHF, tophaceous gout, and CKD stage 3 was admitted for diuresis and consideration for LVAD placement. S/p HVAD on 6/28  Assessment/Plan:    1. Acute/chronic systolic CHF s/p HVAD: Ischemic cardiomyopathy.  St Jude ICD.  Echo (6/18) with EF 15%, mildly dilated RV with moderately decreased systolic function.  s/p HVAD placement 6/28 and had to return to OR to evacuate mediastinal hematoma.  He had been weaned off pressors/milrinone, but developed ileus w/ likely aspiration event 7/4, re-intubated, developed afib/RVR requiring norepinephrine. Extubated 7/6.  VAD speed turned down to 2600 with VT. VAD parameters currently look good.   Milrinone stopped 7/17 with recurrent VT.  He was started back on norepinephrine 8/9 due to soft BP after intubation and suspected septic shock.  Now norepinephrine 4, MAP 70s-80s, CVP 7, co-ox 77%.    - Continue to wean down norepinephrine today.   - Anti-hypertensives on hold with suspected septic shock.  - CVP only reads 7 but CXR with some pulmonary edema and difficult to wean vent, JVP looks higher. Will give Lasix 40 mg IV x 1 and follow response.   - Off ASA with GI Bleed  - Goal INR 2-2.5. Today's INR 2.09.     2. AKI on CKD stage 3: Suspect creatinine fairly stable, looking at trend think yesterday's value was spurious.  3. Symptomatic anemia due to acute UGI bleeding:  Received 3 units PRBCs 7/16 and 3 units PRBCs on 04/28/2017. 1 unit PRBCs 7/20. 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  Requiring 2 clips + epi + APC. 1 unit PRBCs 8/6.  He had 1 unit PRBCs 8/10 to provide volume.  Hemoglobin up appropriately.  - Got Feraheme 7/28.  - Continue Protonix 40 po bid. - Off aspirin for now.  - Transfuse hgb < 8 4. Ventricular tachycardia: Status post ventricular tachycardia 2 on 7/13. Possible suction event. VAD speed turned down to 2700.  Recurrent VT 7/17. EP called to bedside. Overdrive pacing successful for about 10 minutes but VT recurred.  Milrinone turned off, remained in VT overnight but hemodynamically stable.  Speed decreased to 2600.  On 7/18, we were able to pace him out of VT.  Lidocaine added then stopped due to confusion/mental status changes.  He had VT 7/22 about 30 minutes after getting IV Lasix, had to be paced out again => may have been due to rapid fluid shift.  Pt paced out of VT 3 separate occasions 7/25. Got amio bolus x 2 and started back on IV infusion.  Transitioned to amiodarone 400 mg tid.  On 8/1 had recurrent VT and paced out again.  Recurrent VT 8/2 per EP sotalol started along with amiodarone, sotalol decreased to once daily with  bradycardia.   - No VT over night. Continue  mexiletine to 300 mg BID, sotalol 80 mg daily, amio 200 mg BID. - Will have to watch for VT with IV Lasix dose.  5. CAD: s/p CABG 2012. No chest pain.  Off aspirin due to GI bleeding. No change.   6. Gout: Severe tophaceous gout. Left shoulder pain ?gout.     - Continue uloric. No change.  7. Atrial fibrillation: Developed atrial fibrillation with RVR in setting of aspiration PNA on 7/4.    Hard to know with interference from Anselmo, may currently be in rate-controlled afib.    8. Malnutrition: Continue tube feeds via Cor-track. Nutrition following.  9. Aspiration PNA with acute hypoxemic respiratory failure on 7/4: He was extubated on 7/6. Tracheal aspirate with Klebsiella and citrobacter. Both sensitive to Zosyn. Completed abx 7/13. Possible aspiration 8/8.    - On vanco/meropenum.    10. Ileus: Resolved.  11. Deconditioning:  Plan for CIR when stable. OT/PT appreciated. CIR on hold. Reconsult once extubated.   12. Acute Respiratory Failure: Intubated 7/17 in setting of complex GI intervention. Extubated 7/18. Re-intubated 8/8.  Weak respiratory muscles, concern that he could end up requiring tracheostomy.  13. Delirium: Resolved. No further. Appreciate neuro input Possibly related to lidocaine.  Lidocaine stopped. CT of head negative. EEG ok. No further workup per neuro. Neuro  reconsulted 8/7 with AMS => ?related to fever/infection.  He is clear on vent this morning.  - CT head 05/27/17 and 8/10 with lacunar infarcts but do not appear acute.   - Seizure activity versus myoclonus => Depakote started.    15. Insomnia: On remeron for sleep and depression. 16. Post-op depression: Remeron started 7/28.  17. UTI: on vancomycin and meropenum. Urine culture- NGTD.  18. Unstageable Pressure Ulcer: Sacrum. Consult WOC Needs to start santyl daily to enzymatically debride. Reposition R to L. WOC recommendations appreciated.   19. Bradycardia: With hypotension.   Currently back-up pacing set at 70 bpm.  Does not have atrial lead so pacing asynchronously.  Will discuss with EP turning back-up pacing rate down again today.   20. Hypothyroidism: May be related to amiodarone, he is on Levoxyl.  21. ID: Suspected septic shock, source = pressure ulcer versus UTI.  Afebrile now (temp 103 yesterday afternoon), WBCs coming down, BP improving.  Continue meropenem/vancomycin. Cultures so far negative.  Faint rash, ?drug eruption (may be due to meropenem).  Follow for now, may need to switch to aztreonam.   I reviewed the HVAD parameters from today, and compared the results to the patient's prior recorded data.  No programming changes were made.  The HVAD is functioning within specified parameters.    CRITICAL CARE Performed by: Loralie Champagne  Total critical care time: 35 minutes  Critical care time was exclusive of separately billable procedures and treating other patients.  Critical care was necessary to treat or prevent imminent or life-threatening deterioration.  Critical care was time spent personally by me on the following activities: development of treatment plan with patient and/or surrogate as well as nursing, discussions with consultants, evaluation of patient's response to treatment, examination of patient, obtaining history from patient or surrogate, ordering and performing treatments and interventions, ordering and review of laboratory studies, ordering and review of radiographic studies, pulse oximetry and re-evaluation of patient's condition.  Loralie Champagne, MD 06/02/2017, 7:53 AM  VAD Team --- VAD ISSUES ONLY--- Pager [phone] (7am - 7am) Advanced Heart Failure Team  Pager [phone] (M-F; 7a - 4p)  Please contact [facility] for night-coverage after hours (  4p -7a ) and weekends on [email or web address]

## 2017-06-02 NOTE — Progress Notes (Addendum)
Dr. Shirlee LatchMcLean called, concerned persistent RV pacing is having reduction on overall cardiac output, and asked to evaluate underlying rates.  Device was programmed from VVI 70 > VVI 50.  SR 50's-60's, with intermittent V pacing  Francis Dowseenee Jaelen Soth, PA-C  ADDEND Telemetry reviewed, patient was still pacing fairly regularly, in d/w Dr. Shirlee LatchMcLean, ICD was programmed to VVI 60bpm, by industry rep.  Francis Dowseenee Ajahni Nay, PA-C

## 2017-06-02 NOTE — Consult Note (Signed)
WOC Nurse wound follow up New consult placed, however I am following this patient weekly Wound type: unstageable pressure injury; sacrum  Measurement: 4cm x 3cm x 0.1cm  Wound bed:95% soft yellow black eschar/5% pink at proximal wound edge  Drainage (amount, consistency, odor) minimal, yellow/greay, no odor Periwound: intact  Heels intact, using prevalon boots for offloading. Gout lesions are dryer.  Dressing procedure/placement/frequency: Continue enzymatic debridement, needs thick layer applied with each dressing change.  Will re-evaluate in another week for hydrotherapy, currently the eschar is so adherent I do not feel it is ready for therapy.  SPORT mattress in place for pressure redistribution. Continue to turn and reposition every 2 hours. Nutrition via feeding tube for wound healing.   WOC Nurse team will follow along with you for weekly wound assessments.  Please notify me of any acute changes in the wounds or any new areas of concerns [facility]ile Zachow [facility] MSN, RN,CWOCN, CNS, CWON-AP [phone]

## 2017-06-02 NOTE — Progress Notes (Signed)
LVAD Inpatient Coordinator Rounding Note:  Admitted 04/03/2017 due to A/C Heart failure.   [employer] LVAD implanted on 03/27/2017 by Dr. Cyndia Bent as DT VAD.  Transferred to 4E 05/17/17.  Transferred back to ICU 2H09 on 05/28/17 with possible aspiration pneumonia requiring re-intubation.   Vital signs: Temp: Tmax 103.0 yesterday; afebrile today HR: 70 Doppler: 82 Auto BP: 121/100 (108) O2 Sat: 99% on 40% FIO2 and 5 peep Wt in lbs:  238>241>251>246>241>243>249>248>248>240>230>229>226>224>235>221>217>223>224>224  LVAD interrogation reveals:  Speed: 2600 Flow: 4.7 Power: 3.9 w Alarms: suction x 1 on 05/22/17 Peak: 6.9 Trough: 2.8 HCT: 29  Low flow alarm setting: 3.0 High watt alarm setting: 6  Suction: on  Lavare cycle: on  Blood Products: 6/28> 5 PRBC's, 6 FFP 7/1> 1 PRBC 7/9> 1 PRBC 7/13>3 PRBC 7/14>3 PRBC 7/15> 1 PRBC 7/16>3PRBC 7/17> 3 PRBC 7/20> 1 PRBC 8/6> 1 PRBC 8/10 > 1 PRBC  Gtts: Milrinone restarted 7/8, stopped 7/16 Levo stopped 04/26/17 restarted 05/10/2017 - stopped 05/07/17 Amiodarone stopped 04/29/17, restarted 7/17 - transitioned to PO 05/13/17; VT on 05/15/17 - IV amio re-started - 30 mg/hr stopped 05/16/17 Heparin stopped 04/29/17 Lidocaine 05/07/17 - stopped 7/19 Levo 3 mg/min   TPN - started 04/24/17 for nutritional support, stopped 04/30/17  NG inserted with tube feeding started 05/27/17; placed on hold 05/28/17  Cortrak inserted 06/07/17 - started TF 05/29/17  Neuro: 05/27/17- Neurology consult for lethargy, mouth tremors, not vocal, not following commands> Head CT and EEG 05/28/17 - Continual EEG monitoring  05/30/17 - started Depakote for seizure activity vs    Arrhythmia: 04/24/17 - Afib with RVR - started amiodarone 05/02/17 - 2 sustained episodes of VT - cardioverted x 1; second broke with overdrive pacing 1/61- wide complex tachycardia- Amio bolus x3 and gtt at 60 mg/hr 05/07/17 - sustained VT - converted with overdrive pacing; Lidocaine started in  addition to amiodarone 05/08/17 - Lidocaine stopped due to confusion (lido level 12.4) 05/14/17 - sustained VT - converted 3 separate occassions with overdrive pacing 0/96/04 - sustained VT-converted with overdrive pacing  02/22/08- Afib rate in the 50's 05/27/17 - NSR   Respiratory: 04/23/17 - re-intubated due to respiratory failure secondary to suspected aspiration pneumonia 04/25/17-extubated 05/15/2017- intubated for EGD 04/28/2017- extubated 05/28/17 - re-intubated for suspected aspiration pneumonia  Drive Line: Daily Dressing Kits with Aquacell AG silver strips per protocol - will advance to every other day dressing changes. Next dressing change due 05/30/17 unless drainage occurs; if so, please change as needed to keep dressing dry and intact.  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 He had epinephrine, APC, and 2 clips.  - 7/19 - Lidocaine gtt stopped due to confusion (lido toxicity) -8/7- Neurology consult- lethargy, mouth tremors, not vocal, not following commands> Head CT and EEG, blood and urine cultures sent, Ammonia-20 8/8 - re-intubated for possible aspiration pneumonia   Plan/Recommendations:  1. Advance to every other day dressing changes using daily dressing kit; change more often if needed to keep dressing dry and intact. Wife perficient in performing dressing changes.  2. Please call VAD pager with patient and equipment concerns.   Zada Girt RN, VAD Coordinator  24/7 pager [phone]

## 2017-06-02 NOTE — Progress Notes (Signed)
OT Cancellation Note  Patient Details Name: James FabianHarry D Lapidus MRN: [id number] DOB: [birth year]   Cancelled Treatment:    Reason Eval/Treat Not Completed: Fatigue/lethargy limiting ability to participate;Medical issues which prohibited therapy.  Attempted bed level exercises, but pt unable to keep eyes open and maintain arousal to participate.    Tymeka Privette Lovelandonarpe, OTR/L [phone]   [name], Jasara Corrigan M 06/02/2017, 6:57 PM

## 2017-06-03 ENCOUNTER — Inpatient Hospital Stay (HOSPITAL_COMMUNITY): Payer: Medicare PPO

## 2017-06-03 LAB — COOXEMETRY PANEL
CARBOXYHEMOGLOBIN: 1.1 % (ref 0.5–1.5)
Methemoglobin: 1.1 % (ref 0.0–1.5)
O2 SAT: 80.1 %
Total hemoglobin: 9.9 g/dL — ABNORMAL LOW (ref 12.0–16.0)

## 2017-06-03 LAB — CULTURE, RESPIRATORY: CULTURE: NORMAL

## 2017-06-03 LAB — CULTURE, RESPIRATORY W GRAM STAIN

## 2017-06-03 LAB — HEPARIN LEVEL (UNFRACTIONATED)

## 2017-06-03 LAB — GLUCOSE, CAPILLARY
GLUCOSE-CAPILLARY: 120 mg/dL — AB (ref 65–99)
GLUCOSE-CAPILLARY: 206 mg/dL — AB (ref 65–99)
Glucose-Capillary: 167 mg/dL — ABNORMAL HIGH (ref 65–99)
Glucose-Capillary: 174 mg/dL — ABNORMAL HIGH (ref 65–99)
Glucose-Capillary: 179 mg/dL — ABNORMAL HIGH (ref 65–99)
Glucose-Capillary: 194 mg/dL — ABNORMAL HIGH (ref 65–99)
Glucose-Capillary: 215 mg/dL — ABNORMAL HIGH (ref 65–99)

## 2017-06-03 LAB — CBC
HCT: 30.6 % — ABNORMAL LOW (ref 39.0–52.0)
HEMOGLOBIN: 9.7 g/dL — AB (ref 13.0–17.0)
MCH: 28 pg (ref 26.0–34.0)
MCHC: 31.7 g/dL (ref 30.0–36.0)
MCV: 88.2 fL (ref 78.0–100.0)
Platelets: 270 10*3/uL (ref 150–400)
RBC: 3.47 MIL/uL — ABNORMAL LOW (ref 4.22–5.81)
RDW: 18 % — ABNORMAL HIGH (ref 11.5–15.5)
WBC: 23.6 10*3/uL — ABNORMAL HIGH (ref 4.0–10.5)

## 2017-06-03 LAB — LACTATE DEHYDROGENASE: LDH: 154 U/L (ref 98–192)

## 2017-06-03 LAB — BASIC METABOLIC PANEL
Anion gap: 6 (ref 5–15)
BUN: 78 mg/dL — AB (ref 6–20)
CHLORIDE: 113 mmol/L — AB (ref 101–111)
CO2: 24 mmol/L (ref 22–32)
CREATININE: 2.32 mg/dL — AB (ref 0.61–1.24)
Calcium: 7.9 mg/dL — ABNORMAL LOW (ref 8.9–10.3)
GFR calc Af Amer: 35 mL/min — ABNORMAL LOW (ref >60)
GFR calc non Af Amer: 30 mL/min — ABNORMAL LOW (ref >60)
Glucose, Bld: 196 mg/dL — ABNORMAL HIGH (ref 65–99)
Potassium: 5.3 mmol/L — ABNORMAL HIGH (ref 3.5–5.1)
SODIUM: 143 mmol/L (ref 135–145)

## 2017-06-03 LAB — HEPATIC FUNCTION PANEL
ALT: 26 U/L (ref 17–63)
AST: 62 U/L — ABNORMAL HIGH (ref 15–41)
Albumin: 1.8 g/dL — ABNORMAL LOW (ref 3.5–5.0)
Alkaline Phosphatase: 193 U/L — ABNORMAL HIGH (ref 38–126)
BILIRUBIN INDIRECT: 0.5 mg/dL (ref 0.3–0.9)
Bilirubin, Direct: 0.3 mg/dL (ref 0.1–0.5)
TOTAL PROTEIN: 4.9 g/dL — AB (ref 6.5–8.1)
Total Bilirubin: 0.8 mg/dL (ref 0.3–1.2)

## 2017-06-03 LAB — PROTIME-INR
INR: 1.73
PROTHROMBIN TIME: 20.5 s — AB (ref 11.4–15.2)

## 2017-06-03 LAB — VANCOMYCIN, RANDOM: VANCOMYCIN RM: 25

## 2017-06-03 MED ORDER — FUROSEMIDE 10 MG/ML IJ SOLN: 40.0000 mg | Freq: Two times a day (BID) | INTRAMUSCULAR | Status: DC

## 2017-06-03 MED ORDER — LEVETIRACETAM 100 MG/ML PO SOLN
500.0000 mg | Freq: Two times a day (BID) | ORAL | Status: DC
  Administered 2017-06-03 – 2017-06-25 (×45): 500 mg
  Filled 2017-06-03 (×46): qty 5

## 2017-06-03 MED ORDER — HEPARIN (PORCINE) IN NACL 100-0.45 UNIT/ML-% IJ SOLN
3000.0000 [IU]/h | INTRAMUSCULAR | Status: DC
  Administered 2017-06-03: 1800 [IU]/h via INTRAVENOUS
  Administered 2017-06-03: 2100 [IU]/h via INTRAVENOUS
  Administered 2017-06-04: 2900 [IU]/h via INTRAVENOUS
  Administered 2017-06-05: 3200 [IU]/h via INTRAVENOUS
  Administered 2017-06-05: 3000 [IU]/h via INTRAVENOUS
  Administered 2017-06-05: 2900 [IU]/h via INTRAVENOUS
  Administered 2017-06-05: 3400 [IU]/h via INTRAVENOUS
  Filled 2017-06-03 (×7): qty 250

## 2017-06-03 MED ORDER — PREDNISONE 5 MG/ML PO CONC
20.0000 mg | Freq: Every day | ORAL | Status: DC
  Administered 2017-06-03 – 2017-06-05 (×3): 20 mg via ORAL
  Filled 2017-06-03 (×3): qty 4

## 2017-06-03 MED ORDER — FUROSEMIDE 10 MG/ML IJ SOLN
40.0000 mg | Freq: Two times a day (BID) | INTRAMUSCULAR | Status: AC
  Administered 2017-06-03 (×2): 40 mg via INTRAVENOUS
  Filled 2017-06-03 (×2): qty 4

## 2017-06-03 NOTE — Progress Notes (Signed)
Patient ID: James [name], male   DOB: 10/20/1961, 56 y.o.   MRN: [id number]   Advanced Heart Failure VAD Team Note  Subjective:    Events: --HVAD placed 6/28.  Returned to the OR that evening with high chest tube output, evacuation of mediastinal hematoma.  --Extubated 6/29. Milrinone stopped on 7/4. --7/4, patient developed ileus with respiratory compromise. NGT placed with 3 L suctioned out.  CXR with suspicion for aspiration PNA.  Patient had to be intubated.  He went into atrial fibrillation with RVR.  He became hypotensive and was started on norepinephrine and phenylephrine.  Amiodarone gtt begun.    --Extubated again on 7/6.  -- 7/13 Had 2 sustained episodes of VT. Had to be cardioverted 1. Second episode broke with overdrive pacing with Dr. Caryl Comes. VAD speed turned down to 2700.  --Melena due to acuteGI bleed --> 2 units PRBCs 7/14, 2 units 7/15. 3 U PRBCs 7/16,  05/19/2017 3 UPRBCs.  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  He had epinephrine, APC, and 2 clips. Intubated prior to procedure and placed on norepinephrine. Speed dropped to 2660.  --7/18 Back in VT with overdrive pacing. Lidocaine was started in addition to amiodarone. Extubated. --7/19 lidocaine stopped with confusion. Neuro consulted.  EEG normal.  CT of head no acute findings. 7/20 confusion had resolved.  --1 unit PRBCs 7/20.  --7/22, developed sustained VT with rate around 130 shortly after getting IV Lasix.  We were able to pace him out and back to NSR.  He was put back on amiodarone gtt.  --More VT on 7/25, paced out.  Back on IV amiodarone gtt.  NSR.  -- 7/31 Ramp ECHO decrease speed 2600 rpm.  -- 8/1 VT- paced out -- 8/2 VT - started on sotalol -- 8/6 Hgb 7.5, 1 unit PRBCs -- 8/7 Hgb 9.5, confused.  Head CT with right superior cerebellar artery lacunar infarcts, not acute. -- 8/8 Possible aspiration. Reintubated. -- 8/10 possible seizure => Depakote.  CT head no  change.  1 unit PRBCs.   Remains intubated, awake on vent and responds appropriately.  Norepinephrine off.  CVP 9 and weight up, did not have VT with Lasix yesterday.  Creatinine 2.43 => 2.32. WBCs 26.7 => 23.6 => 22 => 23.6.  INR down to 1.7.   Afebrile.  Continues on vancomycin/meropenem for suspected sepsis, cultures still negative.  Has faint erythematous rash abdomen and thighs,no change.   Unable to wean vent yesterday but currently weaning and doing ok.    HVAD INTERROGATION:  HVAD:  Flow 4.4 liters/min, speed 2600,  power 3.9 W,  Peak 7 Trough 3.5. No alarms. Suction On. Lavare On.  Objective:    Vital Signs:   Temp:  [98.7 F (37.1 C)-99.9 F (37.7 C)] 99.3 F (37.4 C) (08/14 0330) Pulse Rate:  [47-78] 60 (08/14 0700) Resp:  [14-29] 16 (08/14 0700) BP: (71-115)/(51-103) 81/58 (08/14 0700) SpO2:  [89 %-100 %] 100 % (08/14 0700) FiO2 (%):  [30 %] 30 % (08/14 0700) Weight:  [234 lb 5.6 oz (106.3 kg)] 234 lb 5.6 oz (106.3 kg) (08/14 0330) Last BM Date: 06/02/17 Mean arterial Pressure 70s-80s Intake/Output:   Intake/Output Summary (Last 24 hours) at 06/03/17 0809 Last data filed at 06/03/17 0700  Gross per 24 hour  Intake          2257.55 ml  Output              865 ml  Net          1392.55 ml     Physical Exam  CVP 9 Physical Exam: GENERAL: Intubated. Opens eyes. HEENT: ETT Cor-track NECK: Supple, JVP 10. No lymphadenopathy or thyromegaly appreciated.  CARDIAC: Mechanical heart sounds with LVAD hum present.  LUNGS: Rhonchi bilaterally  ABDOMEN: Soft, round, nontender, positive bowel sounds x4.  LVAD exit site: well-healed and incorporated. Dressing dry and intact. No erythema or drainage. Stabilization device present and accurately applied. Driveline dressing is being changed daily per sterile technique. EXTREMITIES: Warm and dry, no cyanosis, clubbing, rash. R and LLE 2+ edema to knees. RUE PICC NEUROLOGIC: Intubated follows commands.  Skin: sacrum  with unstageable pressure ulcer. Faint erythematous rash abdomen and thighs.   Telemetry   Personally reviewed: ?atrial fibrillation with v-pacing at 60   Labs   Basic Metabolic Panel:  Recent Labs Lab 05/28/17 0503 05/29/17 0216  05/30/17 1250 05/31/17 0322 06/01/17 0323 06/02/17 0321 06/03/17 0421  NA 137 140  < > 141 143 143 144 143  K 4.4 4.3  < > 4.4 4.2 4.4 4.4 5.3*  CL 108 111  < > 113* 113* 114* 114* 113*  CO2 20* 19*  < > 19* 21* _0 GLUCOSE 111* 104*  < > 166* 210* 153* 132* 196*  BUN 47* 51*  < > 59* 59* 63* 65* 78*  CREATININE 2.16* 2.52*  < > 2.62* 2.47* 1.72* 2.43* 2.32*  CALCIUM 7.9* 7.6*  < > 7.7* 7.8* 7.8* 7.6* 7.9*  MG 2.1 2.0  --   --  2.0  --   --   --   < > = values in this interval not displayed.  Liver Function Tests: No results for input(s): AST, ALT, ALKPHOS, BILITOT, PROT, ALBUMIN in the last 168 hours. No results for input(s): LIPASE, AMYLASE in the last 168 hours. No results for input(s): AMMONIA in the last 168 hours.  CBC:  Recent Labs Lab 05/30/17 0310 05/31/17 0322 06/01/17 0323 06/02/17 0321 06/03/17 0421  WBC 23.7* 26.7* 23.6* 22.2* 23.6*  NEUTROABS  --   --   --  20.0*  --   HGB 8.5* 10.0* 10.0* 9.3* 9.7*  HCT 26.4* 30.5* 30.7* 28.8* 30.6*  MCV 88.6 88.2 87.5 87.8 88.2  PLT 250 305 295 260 270    INR:  Recent Labs Lab 05/30/17 0310 05/31/17 0322 06/01/17 0323 06/02/17 0321 06/03/17 0421  INR 3.15 2.74 2.25 2.09 1.73    Other results:     Imaging   Dg Chest Port 1 View  Result Date: 06/03/2017 CLINICAL DATA:  Acute respiratory failure with hypoxemia. EXAM: PORTABLE CHEST 1 VIEW COMPARISON:  Radiograph of June 01, 2017. FINDINGS: Stable cardiomediastinal silhouette. Endotracheal and feeding tubes are unchanged in position. Left ventricular assistance device is unchanged. Left-sided pacemaker is unchanged. No pneumothorax is noted. Stable bibasilar atelectasis is noted. Right-sided PICC line is unchanged  in position. No pneumothorax is noted. Bony thorax is unremarkable. IMPRESSION: Stable support apparatus. Left ventricular assistance device is again noted. Stable bibasilar atelectasis. Electronically Signed   By: Marijo Conception, M.D.   On: 06/03/2017 07:38     Medications:     Scheduled Medications: . amiodarone  200 mg Per Tube BID  . chlorhexidine gluconate (MEDLINE KIT)  15 mL Mouth Rinse BID  . Chlorhexidine Gluconate Cloth  6 each Topical Q0600  . colchicine  0.6 mg Oral Daily  . collagenase   Topical Daily  . febuxostat  120 mg Oral Daily  . furosemide  40 mg Intravenous BID  . insulin aspart  0-15 Units Subcutaneous Q4H  . levothyroxine  50 mcg Per NG tube QAC breakfast  . magnesium oxide  400 mg Per Tube Daily  . mouth rinse  15 mL Mouth Rinse 10 times per day  . mexiletine  300 mg Per Tube Q12H  . pantoprazole sodium  40 mg Per Tube BID  . sodium chloride flush  10-40 mL Intracatheter Q12H  . sotalol  80 mg Per Tube Daily    Infusions: . feeding supplement (GLUCERNA 1.2 CAL) 1,000 mL (06/02/17 2001)  . heparin    . meropenem (MERREM) IV Stopped (06/02/17 2249)  . norepinephrine (LEVOPHED) Adult infusion Stopped (06/03/17 0753)  . valproate sodium Stopped (06/02/17 2320)    PRN Medications: acetaminophen (TYLENOL) oral liquid 160 mg/5 mL, albuterol, docusate, hydrALAZINE, neomycin-bacitracin-polymyxin, ondansetron (ZOFRAN) IV, oxyCODONE, phenol, sodium chloride flush   Patient Profile   56 yo with CAD s/p CABG, ischemic cardiomyopathy/chronic systolic CHF, tophaceous gout, and CKD stage 3 was admitted for diuresis and consideration for LVAD placement. S/p HVAD on 6/28  Assessment/Plan:    1. Acute/chronic systolic CHF s/p HVAD: Ischemic cardiomyopathy.  [employer] Jude ICD.  Echo (6/18) with EF 15%, mildly dilated RV with moderately decreased systolic function.  s/p HVAD placement 6/28 and had to return to OR to evacuate mediastinal hematoma.  He had been weaned off  pressors/milrinone, but developed ileus w/ likely aspiration event 7/4, re-intubated, developed afib/RVR requiring norepinephrine. Extubated 7/6.  VAD speed turned down to 2600 with VT. VAD parameters currently look good.  Milrinone stopped 7/17 with recurrent VT.  He was started back on norepinephrine 8/9 due to soft BP after intubation and suspected septic shock.  Now back off norepinephrine, MAP 70s, CVP 9, co-ox 80%.    - Anti-hypertensives on hold with suspected septic shock, now off norepinephrine.  Follow MAP closely.  - CVP 9, tolerated Lasix 1 dose IV yesterday without VT.  Weight is going up.  Will give Lasix 40 mg IV bid today.  - Off ASA with GI Bleed  - Goal INR 2-2.5. Today's INR 1.7 => covering with heparin gtt until INR > 1.8.     2. AKI on CKD stage 3: Suspect creatinine fairly stable today, 2.32.  3. Symptomatic anemia due to acute UGI bleeding:  Received 3 units PRBCs 7/16 and 3 units PRBCs on 05/19/2017. 1 unit PRBCs 7/20. 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  Requiring 2 clips + epi + APC. 1 unit PRBCs 8/6.  He had 1 unit PRBCs 8/10 to provide volume.  Hemoglobin now stable.  - Got Feraheme 7/28.  - Continue Protonix 40 po bid. - Off aspirin for now.  - Transfuse hgb < 8 4. Ventricular tachycardia: Status post ventricular tachycardia 2 on 7/13. Possible suction event. VAD speed turned down to 2700.  Recurrent VT 7/17. EP called to bedside. Overdrive pacing successful for about 10 minutes but VT recurred.  Milrinone turned off, remained in VT overnight but hemodynamically stable.  Speed decreased to 2600.  On 7/18, we were able to pace him out of VT.  Lidocaine added then stopped due to confusion/mental status changes.  He had VT 7/22 about 30 minutes after getting IV Lasix, had to be paced out again => may have been due to rapid fluid shift.  Pt paced out of VT 3 separate occasions 7/25. Got amio bolus x  2 and started back on IV infusion.   Transitioned to amiodarone 400 mg tid.  On 8/1 had recurrent VT and paced out again.  Recurrent VT 8/2 per EP sotalol started along with amiodarone, sotalol decreased to once daily with bradycardia.   - No VT over the last day. Continue mexiletine to 300 mg BID, sotalol 80 mg daily, amio 200 mg BID. - Will have to watch for VT with IV Lasix.  5. CAD: s/p CABG 2012. No chest pain.  Off aspirin due to GI bleeding. No change.   6. Gout: Severe tophaceous gout. Left shoulder pain ?gout.     - Continue uloric. No change.  7. Atrial fibrillation: Developed atrial fibrillation with RVR in setting of aspiration PNA on 7/4.    Hard to know with interference from [place], may currently be in rate-controlled afib.    8. Malnutrition: Continue tube feeds via Cor-track. Nutrition following.  9. Aspiration PNA with acute hypoxemic respiratory failure on 7/4: He was extubated on 7/6. Tracheal aspirate with Klebsiella and citrobacter. Both sensitive to Zosyn. Completed abx 7/13. Possible aspiration 8/8.    - On vanco/meropenum.    10. Ileus: Resolved.  11. Deconditioning:  Plan for CIR when stable. OT/PT appreciated. CIR on hold. Reconsult once extubated.   12. Acute Respiratory Failure: Intubated 7/17 in setting of complex GI intervention. Extubated 7/18. Re-intubated 8/8.  Weak respiratory muscles, failed wean yesterday.  Discussed with wife and Dr Ashok Cordia, will try to wean vent again today and tomorrow, if continues to fail will need to decide on trach versus one-way extubation.   13. Delirium: Resolved. No further. Appreciate neuro input Possibly related to lidocaine.  Lidocaine stopped. CT of head negative. EEG ok. No further workup per neuro. Neuro  reconsulted 8/7 with AMS => ?related to fever/infection.  He is clear on vent this morning.  - CT head 05/27/17 and 8/10 with lacunar infarcts but do not appear acute.   - Seizure activity versus myoclonus => Depakote started.    15. Insomnia: On remeron for sleep and  depression. 16. Post-op depression: Remeron started 7/28.  17. UTI: on vancomycin and meropenum. Urine culture- NGTD.  18. Unstageable Pressure Ulcer: Sacrum. Consult WOC Needs to start santyl daily to enzymatically debride. Reposition R to L. WOC recommendations appreciated.   19. Bradycardia: With hypotension.  Currently back-up pacing set at 60 bpm.  Does not have atrial lead so pacing asynchronously.  Current, on and off pacing at rate 60.   20. Hypothyroidism: May be related to amiodarone, he is on Levoxyl.  21. ID: Suspected septic shock, source = pressure ulcer versus UTI.  Afebrile now, WBCs remain elevated, BP improving and off norepinephrine.  Continue meropenem/vancomycin. Cultures so far negative.  Faint rash, ?drug eruption (may be due to meropenem versus depakote).  Follow for now, may need to switch to aztreonam.  22. Rash: ?Drug eruption.  Has not worsened and is faint.  Meropenem versus Depakote may be the culprits, no changes for now.   I reviewed the HVAD parameters from today, and compared the results to the patient's prior recorded data.  No programming changes were made.  The HVAD is functioning within specified parameters.    CRITICAL CARE Performed by: Loralie Champagne  Total critical care time: 35 minutes  Critical care time was exclusive of separately billable procedures and treating other patients.  Critical care was necessary to treat or prevent imminent or life-threatening deterioration.  Critical care was time spent personally by  me on the following activities: development of treatment plan with patient and/or surrogate as well as nursing, discussions with consultants, evaluation of patient's response to treatment, examination of patient, obtaining history from patient or surrogate, ordering and performing treatments and interventions, ordering and review of laboratory studies, ordering and review of radiographic studies, pulse oximetry and re-evaluation of patient's  condition.  Loralie Champagne, MD 06/03/2017, 8:09 AM  VAD Team --- VAD ISSUES ONLY--- Pager [phone] (7am - 7am) Advanced Heart Failure Team  Pager [phone] (M-F; 7a - 4p)  Please contact [facility] for night-coverage after hours (4p -7a ) and weekends on [email or web address]

## 2017-06-03 NOTE — Progress Notes (Signed)
Patient ID: James [name], male   DOB: 05/16/1961, 56 y.o.   MRN: [id number]  Attempted to make contact with [name]/wife for continued emotional support several times today without success.    PMT/Alicia Jimmey RalphParker NP will f/u with family as needed over the next several days.   [name] knows how to contact PMT with questions or concerns.  [name] NP  No charge

## 2017-06-03 NOTE — Progress Notes (Signed)
Patient ID: James [name], male   DOB: Sep 22, 1961, 56 y.o.   MRN: [id number] HVAD Rounding Note  Subjective:    Remains intubated, lethargic but awakens and responds. Levophed weaned off this am but now back on at 1 mcg CVP 8-9  UO 30 cc/hr.  LVAD INTERROGATION:  HVAD:  Flow 4.7 liters/min, speed 2600, power 4  Objective:    Vital Signs:   Temp:  [98.6 F (37 C)-99.9 F (37.7 C)] 99.1 F (37.3 C) (08/14 1639) Pulse Rate:  [58-62] 60 (08/14 1600) Resp:  [14-29] 17 (08/14 1600) BP: (69-115)/(49-103) 70/60 (08/14 1528) SpO2:  [89 %-100 %] 96 % (08/14 1600) FiO2 (%):  [30 %] 30 % (08/14 1528) Weight:  [106.3 kg (234 lb 5.6 oz)] 106.3 kg (234 lb 5.6 oz) (08/14 0330) Last BM Date: 06/03/17 Mean arterial Pressure 70-80's  Intake/Output:   Intake/Output Summary (Last 24 hours) at 06/03/17 1728 Last data filed at 06/03/17 1600  Gross per 24 hour  Intake          2461.89 ml  Output              875 ml  Net          1586.89 ml     Physical Exam: General:  Intubated, lethargic and looks weak Cor:  LVAD hum present. Lungs: coarse Abdomen: soft, nontender, nondistended. Good bowel sounds. Extremities: 2+  Edema to knees bilat Neuro: intubated but follows commands Telemetry: V-paced 60  Labs: Basic Metabolic Panel:  Recent Labs Lab 05/28/17 0503 05/29/17 0216  05/30/17 1250 05/31/17 0322 06/01/17 0323 06/02/17 0321 06/03/17 0421  NA 137 140  < > 141 143 143 144 143  K 4.4 4.3  < > 4.4 4.2 4.4 4.4 5.3*  CL 108 111  < > 113* 113* 114* 114* 113*  CO2 20* 19*  < > 19* 21* _0 GLUCOSE 111* 104*  < > 166* 210* 153* 132* 196*  BUN 47* 51*  < > 59* 59* 63* 65* 78*  CREATININE 2.16* 2.52*  < > 2.62* 2.47* 1.72* 2.43* 2.32*  CALCIUM 7.9* 7.6*  < > 7.7* 7.8* 7.8* 7.6* 7.9*  MG 2.1 2.0  --   --  2.0  --   --   --   < > = values in this interval not displayed.  Liver Function Tests:  Recent Labs Lab 06/03/17 1233  AST 62*  ALT 26  ALKPHOS 193*  BILITOT 0.8   PROT 4.9*  ALBUMIN 1.8*   No results for input(s): LIPASE, AMYLASE in the last 168 hours. No results for input(s): AMMONIA in the last 168 hours.  CBC:  Recent Labs Lab 05/30/17 0310 05/31/17 0322 06/01/17 0323 06/02/17 0321 06/03/17 0421  WBC 23.7* 26.7* 23.6* 22.2* 23.6*  NEUTROABS  --   --   --  20.0*  --   HGB 8.5* 10.0* 10.0* 9.3* 9.7*  HCT 26.4* 30.5* 30.7* 28.8* 30.6*  MCV 88.6 88.2 87.5 87.8 88.2  PLT 250 305 295 260 270    INR:  Recent Labs Lab 05/30/17 0310 05/31/17 0322 06/01/17 0323 06/02/17 0321 06/03/17 0421  INR 3.15 2.74 2.25 2.09 1.73    Other results:  EKG:   Imaging: Dg Chest Port 1 View  Result Date: 06/03/2017 CLINICAL DATA:  Acute respiratory failure with hypoxemia. EXAM: PORTABLE CHEST 1 VIEW COMPARISON:  Radiograph of June 01, 2017. FINDINGS: Stable cardiomediastinal silhouette. Endotracheal and feeding tubes are unchanged in position. Left ventricular  assistance device is unchanged. Left-sided pacemaker is unchanged. No pneumothorax is noted. Stable bibasilar atelectasis is noted. Right-sided PICC line is unchanged in position. No pneumothorax is noted. Bony thorax is unremarkable. IMPRESSION: Stable support apparatus. Left ventricular assistance device is again noted. Stable bibasilar atelectasis. Electronically Signed   By: Marijo Conception, M.D.   On: 06/03/2017 07:38      Medications:     Scheduled Medications: . amiodarone  200 mg Per Tube BID  . chlorhexidine gluconate (MEDLINE KIT)  15 mL Mouth Rinse BID  . Chlorhexidine Gluconate Cloth  6 each Topical Q0600  . colchicine  0.6 mg Oral Daily  . collagenase   Topical Daily  . febuxostat  120 mg Oral Daily  . furosemide  40 mg Intravenous BID  . insulin aspart  0-15 Units Subcutaneous Q4H  . levETIRAcetam  500 mg Per Tube BID  . levothyroxine  50 mcg Per NG tube QAC breakfast  . magnesium oxide  400 mg Per Tube Daily  . mouth rinse  15 mL Mouth Rinse 10 times per day  .  mexiletine  300 mg Per Tube Q12H  . pantoprazole sodium  40 mg Per Tube BID  . predniSONE  20 mg Oral Q breakfast  . sodium chloride flush  10-40 mL Intracatheter Q12H  . sotalol  80 mg Per Tube Daily     Infusions: . feeding supplement (GLUCERNA 1.2 CAL) 1,000 mL (06/03/17 1600)  . heparin 2,100 Units/hr (06/03/17 1600)  . meropenem (MERREM) IV Stopped (06/03/17 0943)  . norepinephrine (LEVOPHED) Adult infusion 0.96 mcg/min (06/03/17 1600)     PRN Medications:  acetaminophen (TYLENOL) oral liquid 160 mg/5 mL, albuterol, docusate, hydrALAZINE, neomycin-bacitracin-polymyxin, ondansetron (ZOFRAN) IV, oxyCODONE, phenol, sodium chloride flush   Assessment/Plan    1. POD 47s/p HVAD for ischemic cardiomyopathy with acute on chronic systolic heart failure, EF 15% with moderate RV dysfunction. Returned to OR for bleeding from raw mediastinal tissues. His VAD parameters have been stable. Levophed weaned down today.   INR down to 1.7 so covering with heparin until INR >1.8.  2. Acute mental status changes of unclear etiology. This could be related to infection, medications, hypothyroidism, possibly stroke not see on CT. CT head from 8/7 showed lacunar infarcts that did not appear acute. Seen by neurology. EEG does not show seizures activity. Seems better since treating infection. He is still lethargic but following commands. I think sepsis is probably the most likely cause.  3. Acute respiratory failure: reintubated for airway protection. Gas exchange is ok and CXR looks fairly clear. Trying to wean.  He may need trach.  4. Acute on chronic kidney failure. Creat down slightly today to 2.32. Urine output 30/hr.  May be related to sepsis, volume depletion.  5. GI bleeding and anemia: does not appear to be bleeding at this time. His Hgb is improved after transfusion.  6. VT: None since Sotalol started in addition to mexiletine and amiodarone.  7. Fever and Leukocytosis.  Nothing has cultured out yet. Leukocytosis is improving. Sacral pressure sore may be responsible. On Vanc and meropenum.  8. Sacral pressure sore: treating per Jal rec.    I reviewed the LVAD parameters from today, and compared the results to the patient's prior recorded data.  No programming changes were made.  The LVAD is functioning within specified parameters.    LVAD equipment check completed and is in good working order.  Back-up equipment present.   Length of Stay: [address]  K Carley Strickling 06/03/2017, 5:28 PM

## 2017-06-03 NOTE — Plan of Care (Signed)
Problem: Activity: Goal: Risk for activity intolerance will decrease Outcome: Not Progressing Has not been able to be extubated. Passive ROM exercises completed by RN staff. Movement is also limited by painful gout flair up.

## 2017-06-03 NOTE — Progress Notes (Signed)
PULMONARY / CRITICAL CARE MEDICINE   Name: James [name] MRN: [id number] DOB: [birth year]    ADMISSION DATE:  04/10/2017 CONSULTATION DATE:  04/23/2017  REFERRING MD:  Laneta Simmers - CVTS  CHIEF COMPLAINT:  Acute respiratory failure  BRIEF SUMMARY:  56 y.o. male with history of systolic congestive heart failure with EF 15%, coronary artery disease, chronic renal failure, diabetes, and essential hypertension admitted on 03/22/2017 for ventricular assist device placement. Patient previously turned down by [facility] for transplant due to limitations in his mobility and his underlying gout. Course complicated by acute on chronic renal failure. He has had recurrent episodes of supraventricular tachycardia and GI bleeding. Patient was reintubated on 8/8 with concern for aspiration secondary to increased work of breathing and displaced NG tube. PCCM was consulted for help.  SUBJECTIVE:  Tolerating some PS weaning. No acute events overnight. Cards giving more Lasix today. Patient complaining of pain in his joints.  REVIEW OF SYSTEMS:  Unable to obtain as patient is intubated.  VITAL SIGNS: BP 95/66 (BP Location: Left Arm)   Pulse 61   Temp 98.6 F (37 C) (Oral)   Resp (!) 31   Ht 6' (1.829 m)   Wt 234 lb 5.6 oz (106.3 kg)   SpO2 100%   BMI 31.78 kg/m   HEMODYNAMICS: CVP:  [2 mmHg-9 mmHg] 9 mmHg  VENTILATOR SETTINGS: Vent Mode: PSV;CPAP FiO2 (%):  [30 %] 30 % Set Rate:  [14 bmp] 14 bmp Vt Set:  [620 mL] 620 mL PEEP:  [5 cmH20] 5 cmH20 Pressure Support:  [10 cmH20] 10 cmH20 Plateau Pressure:  [14 cmH20-19 cmH20] 14 cmH20  INTAKE / OUTPUT: I/O last 3 completed shifts: In: 3311.7 [I.V.:96.7; NG/GT:2850; IV Piggyback:365] Out: 1290 [Urine:1140; Stool:150]  PHYSICAL EXAMINATION: General:  No family at bedside. Sleeping until awoken. No distress.  Integument:  Warm. Dry. HEENT: Endotracheal tube remains in place. No scleral icterus. Moist mucous membranes. Cardiovascular:   Regular rate. Anasarca. Unable appreciate any JVD.  Pulmonary:  Symmetric chest wall rise on ventilator. Distant breath sounds obscured by hum of LVAD. Abdomen: Normal bowel sounds. Soft. Nondistended. Musculoskeletal:  Diffuse joint swelling & deformities noted. Neurological:  Nods to questions. Grossly nonfocal.   LABS:  BMET  Recent Labs Lab 06/01/17 0323 06/02/17 0321 06/03/17 0421  NA 143 144 143  K 4.4 4.4 5.3*  CL 114* 114* 113*  CO2 22 24 24   BUN 63* 65* 78*  CREATININE 1.72* 2.43* 2.32*  GLUCOSE 153* 132* 196*   Electrolytes  Recent Labs Lab 05/28/17 0503 05/29/17 0216  05/31/17 0322 06/01/17 0323 06/02/17 0321 06/03/17 0421  CALCIUM 7.9* 7.6*  < > 7.8* 7.8* 7.6* 7.9*  MG 2.1 2.0  --  2.0  --   --   --   < > = values in this interval not displayed. CBC  Recent Labs Lab 06/01/17 0323 06/02/17 0321 06/03/17 0421  WBC 23.6* 22.2* 23.6*  HGB 10.0* 9.3* 9.7*  HCT 30.7* 28.8* 30.6*  PLT 295 260 270   Coag's  Recent Labs Lab 06/01/17 0323 06/02/17 0321 06/03/17 0421  INR 2.25 2.09 1.73   Sepsis Markers  Recent Labs Lab 05/28/17 1017 05/29/17 0216 05/29/17 0230 05/30/17 0310  LATICACIDVEN  --   --  1.1  --   PROCALCITON 0.99 1.49  --  1.33   ABG  Recent Labs Lab 05/28/17 1132  PHART 7.352  PCO2ART 37.6  PO2ART 360.0*   Liver Enzymes No results for input(s): AST,  ALT, ALKPHOS, BILITOT, ALBUMIN in the last 168 hours.  Cardiac Enzymes No results for input(s): TROPONINI, PROBNP in the last 168 hours.  Glucose  Recent Labs Lab 06/02/17 0816 06/02/17 1605 06/02/17 2025 06/03/17 0004 06/03/17 0433 06/03/17 0806  GLUCAP 106* 189* 185* 174* 179* 167*    Imaging Dg Chest Port 1 View  Result Date: 06/03/2017 CLINICAL DATA:  Acute respiratory failure with hypoxemia. EXAM: PORTABLE CHEST 1 VIEW COMPARISON:  Radiograph of June 01, 2017. FINDINGS: Stable cardiomediastinal silhouette. Endotracheal and feeding tubes are unchanged in  position. Left ventricular assistance device is unchanged. Left-sided pacemaker is unchanged. No pneumothorax is noted. Stable bibasilar atelectasis is noted. Right-sided PICC line is unchanged in position. No pneumothorax is noted. Bony thorax is unremarkable. IMPRESSION: Stable support apparatus. Left ventricular assistance device is again noted. Stable bibasilar atelectasis. Electronically Signed   By: Lupita Raider, M.D.   On: 06/03/2017 07:38     IMAGING/STUDIES: CT HEAD W/O 8/10: IMPRESSION: 1. No acute intracranial process. 2. 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 PORT CXR 8/11:  Previously reviewed by me. No focal opacity. Right upper extremity central venous catheter in place. Endotracheal tube in good positioning. Enteric feeding tube coursing below diaphragm. PORT CXR 8/14:  Personally reviewed by me. LVAD in place. No new focal opacity. No pleural effusion appreciated. Endotracheal tube and central venous catheter in good position. Enteric feeding tube coursing below diaphragm. Persistent silhouetting of left hemidiaphragm and lower lung opacity.  MICROBIOLOGY: MRSA PCR 6/22:  Positive Blood Cultures x2 7/5:  Negative  Tracheal Aspirate Culture 7/5:  Citrobacter koseri & Klebsiella pneumoniae  Tracheal Aspirate Culture 7/7:  Klebsiella pneumoniae MRSA PCR 7/26:  Negative  Urine Culture 8/7:  Multiple Species  Blood Cultures x3 8/7:  Negative  Tracheal Aspirate Culture 8/8:  Multiple Organisms Present Urine Culture 8/9:  Negative Blood Cultures x2 8/11 >>> Tracheal Aspirate Culture 8/11 >>> Blood Cultures x2 8/12 >>>  ANTIBIOTICS: Zinacef 6/28 (periop) Zosyn 7/5 - 7/13 Vancomycin 7/5 - 7/7; 8/8 >>> Merrem 8/8 >>>  SIGNIFICANT EVENTS: 06/22 - Admit  06/28 - Implanting of LVAD 07/05 - Respiratory arrest post feculent vomiting episode 07/06 - Extubated 07/17 - Intubated during EGD 07/18  - Extubated 08/06 - Respiratory distress & intubated. Lasix x2 doses given. 08/13 - Worsened renal function w/ Lasix w/o significant diuresis. Weaned on PS some.  08/14 - Lasix given by Cardiology  LINES/TUBES: OETT 7/5 - 7/6; 7/17-7/18; 8/6 >>> RUE TL PICC 7/2 >>> R NGT >>> FOLEY >>> RECTAL TUBE >>>  ASSESSMENT / PLAN:    PULMONARY A: Acute, Recurrent Hypoxic Respiratory Failure Aspiration Pneumonia:  Secondary to tube feedings.  P:   Continuing daily pressure support wean Full ventilator support for arrest Wife considering tracheostomy versus one way extubation  CARDIOVASCULAR A:  Acute on chronic systolic congestive heart failure: Turned down for heart transplant. Status post LVAD. Atrial fibrillation Recurrent ventricular tachycardia Mediastinal hematoma: Status post evacuation post-LVAD. H/O essential hypertension  P:  Vitals per unit protocol Continue restorative monitoring Systemic anticoagulation Management per cardiology & cardiothoracic surgery  RENAL A:   Acute on chronic renal failure stage III: Very sensitive to diuresis.  P:   Monitor urine output with Foley catheter Trending renal function and electrolytes daily Lasix diuresis per cardiology  GASTROINTESTINAL A:   Severe protein-calorie malnutrition Chronic mesenteric ischemia  P:   Nothing by mouth  HEMATOLOGIC A:   Anemia: Multifactorial. Secondary  to blood loss from mediastinal hematoma. Leukocytosis: Presumably secondary to sepsis versus gout flare. Coagulopathy: Secondary to systemic and coagulation.   P:  Trending cell counts daily Trending INR daily Transfusion goal per primary service  INFECTIOUS A:   Aspiration pneumonia FUO: Cultures repeatedly negative.  P:   Continuing empiric antibiotics as above Awaiting finalization of culture results Plan to repeat for any fever  ENDOCRINE A:   Diabetes Mellitus Type 2:  Glucose controlled. Hypothyroidism    P:    Continuing Synthroid via tube daily Accu-Cheks every 4 hours Sliding-scale insulin per algorithm  NEUROLOGIC/MUSCULOSKELETAL A:   Sedation on ventilator Gout  P:   RASS goal: 0 to -1 Colchicine started 8/13 oxycodone via tube when necessary  Prophylaxis: Protonix via tube twice a day & systemic anticoagulation. Diet:  NPO.  Continuing tube feedings per dietary recommendations. Code Status:  Partial Code as per previous physician discussions. Disposition:  Remains critically ill in the ICU. Plan for one-way extubation versus tracheostomy placement depending on family decision.  Family Update:  No family at bedside at the time of my exam.   DISCUSSION:  56 y.o.  male status post LVAD. Tolerating some pressure support wean but given recurrent intubation feel patient would be unsafe to extubate given potential for complications was reintubation. Planning for either tracheostomy placement or one way extubation when family is ready and patient is medically optimized as much as possible. Respiratory muscle weakness is a significant barrier to patient's safe extubation. Palliative medicine following.  Remainder of care as per primary service and other consultants.  I have spent a total of 32 minutes of critical care time today caring for the patient and reviewing the patient's electronic medical record.   Donna ChristenJennings E. Jamison NeighborNestor, M.D. [facility] Pager:  [phone] After 3pm or if no response, call [phone] 06/03/2017, 8:48 AM

## 2017-06-03 NOTE — Progress Notes (Addendum)
Wife performed VAD drive line exit site without issues. Existing VAD dressing removed and site care performed using sterile technique. Drive line exit site cleaned with Chlora prep applicators x 2, allowed to dry, and gauze dressing with aquacel silver strip re-applied. Exit site with partial tissue ingrowth; velour is fully implanted at exit site. No redness, tenderness, or foul odor noted. Small amount serosanguineous drainage noted. Drive line anchor intact and accurately applied.   Will continue every other day dressing changes to VAD exit site.   Assisted nurse with sacral dressing change; using Santyl ointment with wet to dry dressing changes, covered with foam dressing.     Pt still tolerating pressure support wean at this time - started 8:00 am this morning.    Hessie DienerMolly Reece RN, VAD Coordinator 24/7 VAD pager: [phone]

## 2017-06-03 NOTE — Progress Notes (Addendum)
LVAD Inpatient Coordinator Rounding Note:  Admitted 03/27/2017 due to A/C Heart failure.   [employer] LVAD implanted on 03/21/2017 by Dr. Laneta Simmers as DT VAD.  Transferred to 4E 05/17/17.  Transferred back to ICU 2H09 on 05/28/17 with possible aspiration pneumonia requiring re-intubation.   Vital signs: Temp: Tmax 99.9 HR: 60 Doppler: 82 Auto BP: 121/100 (108) O2 Sat: 100% on 30% FIO2 and 5 peep Wt in lbs: Marland Kitchen.. 230>229>226>224>235>221>217>223>224>224>234  LVAD interrogation reveals:  Speed: 2600 Flow: 4.5 Power: 4.0  w Alarms: none Peak:  6.6 Trough:  3.2 HCT: 31 Low flow alarm setting: 2.5 High watt alarm setting: 5.5  Suction: on  Lavare cycle: on  Blood Products: 6/28> 5 PRBC's, 6 FFP 7/1> 1 PRBC 7/9> 1 PRBC 7/13>3 PRBC 7/14>3 PRBC 7/15> 1 PRBC 7/16>3PRBC 7/17> 3 PRBC 7/20> 1 PRBC 8/6> 1 PRBC 8/10 > 1 PRBC  Gtts: Milrinone restarted 7/8, stopped 7/16 Levo stopped 04/26/17 restarted 2017-06-04 - stopped 05/07/17 Amiodarone stopped 04/29/17, restarted 7/17 - transitioned to PO 05/13/17; VT on 05/15/17 - IV amio re-started - 30 mg/hr stopped 05/16/17 Heparin stopped 04/29/17 Lidocaine 05/07/17 - stopped 7/19  TPN - started 04/24/17 for nutritional support, stopped 04/30/17  NG inserted with tube feeding started 05/27/17; placed on hold 05/28/17  Cortrak inserted 06/07/17 - started TF 05/29/17  Neuro: 05/27/17- Neurology consult for lethargy, mouth tremors, not vocal, not following commands> Head CT and EEG 05/28/17 - Continual EEG monitoring  05/30/17 - started Depakote for seizure activity - stopped 06/03/17 for rash - will start Keppra   Arrhythmia: 04/24/17 - Afib with RVR - started amiodarone 05/02/17 - 2 sustained episodes of VT - cardioverted x 1; second broke with overdrive pacing 7/17- wide complex tachycardia- Amio bolus x3 and gtt at 60 mg/hr 05/07/17 - sustained VT - converted with overdrive pacing; Lidocaine started in addition to amiodarone 05/08/17 - Lidocaine  stopped due to confusion (lido level 12.4) 05/14/17 - sustained VT - converted 3 separate occassions with overdrive pacing 05/19/17 - sustained VT-converted with overdrive pacing  05/26/17- Afib rate in the 50's 05/27/17 - NSR   Respiratory: 04/23/17 - re-intubated due to respiratory failure secondary to suspected aspiration pneumonia 04/25/17-extubated June 04, 2017- intubated for EGD 06-04-17- extubated 05/28/17 - re-intubated for suspected aspiration pneumonia  Drive Line: Daily Dressing Kits with Aquacell AG silver strips per protocol - will advance to every other day dressing changes. Next dressing change due 05/30/17 unless drainage occurs; if so, please change as needed to keep dressing dry and intact.  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 He had epinephrine, APC, and 2 clips.  - 7/19 - Lidocaine gtt stopped due to confusion (lido toxicity) -8/7- Neurology consult- lethargy, mouth tremors, not vocal, not following commands> Head CT and EEG, blood and urine cultures sent, Ammonia-20 8/8 - re-intubated for possible aspiration pneumonia   Plan/Recommendations:  1. Currently every other day dressing changes; call VAD coordinator to perform dressing change today, may be able to advance to twice weekly. Wife perficient in performing dressing changes.  2.  VAD coordinator to check on sacral decubitus at time of VAD dressing change today. 3.  Rash  extended to trunk, arms, and legs - Dr. Shirlee LatchMcLean updated. Stopping depakote, will start Keppra per Neuro recommendation.  4.  Pt c/o increasing finger/joint pain - pt started on Colchicine yesterday. Per wife - this can take several weeks to relieve symptoms. Dr. Shirlee LatchMcLean updated - will start Prednisone for gout flare.  5. Vent weaning this am - pt tolerating at this time. Nurse reports he tired out yesterday after 4 - 5 hours.  2. Please call VAD pager with patient and equipment concerns.   Hessie DienerMolly Iness Pangilinan RN, VAD Coordinator 24/7 pager [phone]

## 2017-06-03 NOTE — Progress Notes (Signed)
Chaplain stopped by to visit with family who has had a long length of stay.  Chaplain provided space for wife and mother of the patient to share their feelings freely.  James [name], the patient is being weaned off vent today and they are not sure how he will do but are remaining hopeful.  Chaplain stood by bedside with the mother of the patient and prayed with her and the patient.    Chaplain provided ministry of presence, encouragement and prayer for this patient and family.  Chaplain would like to thank the CSW and the medical team for their constant and continual care for this patient.    06/03/17 1032  Clinical Encounter Type  Visited With Patient and family together  Visit Type Follow-up;Psychological support;Spiritual support;Social support;Critical Care  Consult/Referral To Chaplain  Recommendations (Continuing care for long length of stay for patient & family)  Spiritual Encounters  Spiritual Needs Prayer;Emotional  Stress Factors  Patient Stress Factors Exhausted;Health changes;Major life changes  Family Stress Factors Health changes;Exhausted

## 2017-06-03 NOTE — Progress Notes (Addendum)
Addendum: Afternoon heparin level undetectable as somewhat expected as starting low and titrating. No issues noted. Will increase rate to 2100 units/hr. Recheck in 6 hours.  06/03/2017 1:38 PM   ANTICOAGULATION CONSULT NOTE - Follow Up Consult  Pharmacy Consult for Warfarin >>heparin Indication: HVAD  No Active Allergies  Patient Measurements: Height: 6' (182.9 cm) Weight: 234 lb 5.6 oz (106.3 kg) IBW/kg (Calculated) : 77.6  Vital Signs: Temp: 98.6 F (37 C) (08/14 0811) Temp Source: Oral (08/14 0811) BP: 78/64 (08/14 0900) Pulse Rate: 60 (08/14 1000)  Labs:  Recent Labs  06/01/17 0323 06/02/17 0321 06/03/17 0421  HGB 10.0* 9.3* 9.7*  HCT 30.7* 28.8* 30.6*  PLT 295 260 270  LABPROT 25.2* 23.8* 20.5*  INR 2.25 2.09 1.73  CREATININE 1.72* 2.43* 2.32*    Estimated Creatinine Clearance: 45.9 mL/min (A) (by C-G formula based on SCr of 2.32 mg/dL (H)).   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 He developed an ileus on 7/5, warfarin was held, and heparin bridge started once INR fell below 1.8.  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    Warfarin resumed 7/18 without heparin bridge. Warfarin stopped 8/8 after intubation. INR has remained >2 since that time.  INR has finally drifted down to 1.7 this morning, patient remains intubated. Possible trach and other procedures are still pending so will start heparin drip for anticoagulation and hold off on warfarin at this time.   CBC has remained fairly stable, 9.7 this morning, LHD stable 150s. No bleeding issues noted overnight.  Sepsis/aspiration: patient continues on meropenem and vancomycin. Vancomycin levels remain elevated this morning at 25, down slightly from yesterday. Scr up but stable overall, uop low, no fevers, wbc stable in 20s. Prognosis remains guarded. Discussed drug related rash with MD, no changes for now.    Goal  of Therapy:  Heparin level goal 0.3-0.5 INR 2-2.5 Monitor platelets by anticoagulation protocol: Yes   Plan:  1) Hold warfarin for now 2) start heparin this morning at 1800 units/hr - will titrate up as needed. 3) Continue to hold vancomycin, no change in meropenem 4) Continue to check random vancomycin level in am  Sheppard CoilFrank Zoraya Fiorenza PharmD., BCPS Clinical Pharmacist Pager [phone] 06/03/2017 10:44 AM

## 2017-06-03 NOTE — Progress Notes (Addendum)
ANTICOAGULATION CONSULT NOTE - Follow Up Consult  Pharmacy Consult for Warfarin >>heparin Indication: HVAD  No Active Allergies  Patient Measurements: Height: 6' (182.9 cm) Weight: 234 lb 5.6 oz (106.3 kg) IBW/kg (Calculated) : 77.6  Vital Signs: Temp: 98.4 F (36.9 C) (08/14 1918) Temp Source: Oral (08/14 1918) BP: 74/51 (08/14 2200) Pulse Rate: 61 (08/14 2200)  Labs:  Recent Labs  06/01/17 0323 06/02/17 0321 06/03/17 0421 06/03/17 1233 06/03/17 2140  HGB 10.0* 9.3* 9.7*  --   --   HCT 30.7* 28.8* 30.6*  --   --   PLT 295 260 270  --   --   LABPROT 25.2* 23.8* 20.5*  --   --   INR 2.25 2.09 1.73  --   --   HEPARINUNFRC  --   --   --  <0.10* <0.10*  CREATININE 1.72* 2.43* 2.32*  --   --     Estimated Creatinine Clearance: 45.9 mL/min (A) (by C-G formula based on SCr of 2.32 mg/dL (H)).   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 He developed an ileus on 7/5, warfarin was held, and heparin bridge started once INR fell below 1.8.  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    Warfarin resumed 7/18 without heparin bridge. Warfarin stopped 8/8 after intubation. INR has remained >2 since that time.  INR down to 1.5 this morning, patient remains intubated. No plans for trach and hope to have vent wean soon, will continue heparin drip for anticoagulation and hold off on warfarin at this time.   CBC has remained fairly stable, 10.2 this morning, LHD stable. No bleeding issues noted overnight.  Sepsis/aspiration: patient continues on meropenem and vancomycin. Vancomycin level down this morning at 18, will give one time dose this morning then plan on checking random level in a couple days. Scr continues to trend up and k is 6. No fevers, wbc up to 25, started on prednisone yesterday. Cultures have remained negative.   Goal of Therapy:  Heparin level goal 0.3-0.5 INR 2-2.5 Monitor  platelets by anticoagulation protocol: Yes  Vancomycin trough 15-20   Thank you,  Sheppard CoilFrank Wilbon Obenchain PharmD., BCPS Clinical Pharmacist Pager [phone] 06/04/2017 11:13 AM

## 2017-06-04 LAB — GLUCOSE, CAPILLARY
GLUCOSE-CAPILLARY: 215 mg/dL — AB (ref 65–99)
GLUCOSE-CAPILLARY: 171 mg/dL — AB (ref 65–99)
GLUCOSE-CAPILLARY: 198 mg/dL — AB (ref 65–99)
GLUCOSE-CAPILLARY: 223 mg/dL — AB (ref 65–99)
GLUCOSE-CAPILLARY: 235 mg/dL — AB (ref 65–99)

## 2017-06-04 LAB — BASIC METABOLIC PANEL
ANION GAP: 8 (ref 5–15)
Anion gap: 6 (ref 5–15)
BUN: 82 mg/dL — ABNORMAL HIGH (ref 6–20)
BUN: 81 mg/dL — AB (ref 6–20)
CALCIUM: 7.6 mg/dL — AB (ref 8.9–10.3)
CHLORIDE: 112 mmol/L — AB (ref 101–111)
CHLORIDE: 114 mmol/L — AB (ref 101–111)
CO2: 23 mmol/L (ref 22–32)
CO2: 24 mmol/L (ref 22–32)
CREATININE: 2.19 mg/dL — AB (ref 0.61–1.24)
Calcium: 7.9 mg/dL — ABNORMAL LOW (ref 8.9–10.3)
Creatinine, Ser: 2.33 mg/dL — ABNORMAL HIGH (ref 0.61–1.24)
GFR calc non Af Amer: 32 mL/min — ABNORMAL LOW (ref >60)
GFR, EST AFRICAN AMERICAN: 35 mL/min — AB (ref >60)
GFR, EST AFRICAN AMERICAN: 37 mL/min — AB (ref >60)
GFR, EST NON AFRICAN AMERICAN: 30 mL/min — AB (ref >60)
Glucose, Bld: 227 mg/dL — ABNORMAL HIGH (ref 65–99)
Glucose, Bld: 251 mg/dL — ABNORMAL HIGH (ref 65–99)
Potassium: 6 mmol/L — ABNORMAL HIGH (ref 3.5–5.1)
Potassium: 6.1 mmol/L — ABNORMAL HIGH (ref 3.5–5.1)
SODIUM: 143 mmol/L (ref 135–145)
SODIUM: 144 mmol/L (ref 135–145)

## 2017-06-04 LAB — COOXEMETRY PANEL
CARBOXYHEMOGLOBIN: 1.7 % — AB (ref 0.5–1.5)
METHEMOGLOBIN: 1 % (ref 0.0–1.5)
O2 SAT: 86.3 %
Total hemoglobin: 10.6 g/dL — ABNORMAL LOW (ref 12.0–16.0)

## 2017-06-04 LAB — CBC
HCT: 31.9 % — ABNORMAL LOW (ref 39.0–52.0)
HEMOGLOBIN: 10.2 g/dL — AB (ref 13.0–17.0)
MCH: 28.2 pg (ref 26.0–34.0)
MCHC: 32 g/dL (ref 30.0–36.0)
MCV: 88.1 fL (ref 78.0–100.0)
PLATELETS: 323 10*3/uL (ref 150–400)
RBC: 3.62 MIL/uL — AB (ref 4.22–5.81)
RDW: 18.2 % — ABNORMAL HIGH (ref 11.5–15.5)
WBC: 25 10*3/uL — AB (ref 4.0–10.5)

## 2017-06-04 LAB — HEPATIC FUNCTION PANEL
ALBUMIN: 1.7 g/dL — AB (ref 3.5–5.0)
ALT: 23 U/L (ref 17–63)
AST: 52 U/L — AB (ref 15–41)
Alkaline Phosphatase: 174 U/L — ABNORMAL HIGH (ref 38–126)
BILIRUBIN DIRECT: 0.2 mg/dL (ref 0.1–0.5)
Indirect Bilirubin: 0.6 mg/dL (ref 0.3–0.9)
TOTAL PROTEIN: 5 g/dL — AB (ref 6.5–8.1)
Total Bilirubin: 0.8 mg/dL (ref 0.3–1.2)

## 2017-06-04 LAB — PROTIME-INR
INR: 1.52
PROTHROMBIN TIME: 18.4 s — AB (ref 11.4–15.2)

## 2017-06-04 LAB — VANCOMYCIN, RANDOM: VANCOMYCIN RM: 18

## 2017-06-04 LAB — HEPARIN LEVEL (UNFRACTIONATED): Heparin Unfractionated: <0.1 IU/mL — ABNORMAL LOW (ref 0.30–0.70)

## 2017-06-04 LAB — LACTATE DEHYDROGENASE: LDH: 188 U/L (ref 98–192)

## 2017-06-04 MED ORDER — FUROSEMIDE 10 MG/ML IJ SOLN
40.0000 mg | Freq: Once | INTRAMUSCULAR | Status: AC
  Administered 2017-06-04: 40 mg via INTRAVENOUS
  Filled 2017-06-04: qty 4

## 2017-06-04 MED ORDER — VANCOMYCIN HCL IN DEXTROSE 750-5 MG/150ML-% IV SOLN
750.0000 mg | Freq: Once | INTRAVENOUS | Status: AC
  Administered 2017-06-04: 750 mg via INTRAVENOUS
  Filled 2017-06-04: qty 150

## 2017-06-04 MED ORDER — SODIUM POLYSTYRENE SULFONATE 15 GM/60ML PO SUSP
30.0000 g | Freq: Once | ORAL | Status: AC
  Administered 2017-06-04: 30 g
  Filled 2017-06-04: qty 120

## 2017-06-04 MED ORDER — ALBUMIN HUMAN 5 % IV SOLN
25.0000 g | Freq: Once | INTRAVENOUS | Status: AC
  Administered 2017-06-04: 25 g via INTRAVENOUS
  Filled 2017-06-04: qty 250

## 2017-06-04 NOTE — Progress Notes (Signed)
PT Cancellation Note  Patient Details Name: James FabianHarry D Finlayson MRN: [id number] DOB: 03-17-1961   Cancelled Treatment:    Reason Eval/Treat Not Completed: Other (comment) (OT to check on pt today.  PT will check back tomorrow. )   Berline LopesDawn F Keena Dinse 06/04/2017, 1:59 PM [facility] Clinic[facility]wn Zunaira Lamy,PT Acute Rehabilitation [phone] (pager)

## 2017-06-04 NOTE — Progress Notes (Signed)
LVAD Inpatient Coordinator Rounding Note:  Admitted 04/08/2017 due to A/C Heart failure.   [employer] LVAD implanted on 04/14/2017 by Dr. Laneta Simmers as DT VAD.  Transferred to 4E 05/17/17.  Transferred back to ICU 2H09 on 05/28/17 with possible aspiration pneumonia requiring re-intubation.   Vital signs: Temp: Tmax 97.8 HR: 60 Doppler: not done Auto BP: 109/83 (92) O2 Sat: 97% on 30% FIO2 and 5 peep Wt in lbs: Marland Kitchen.. 230>229>226>224>235>221>217>223>224>224>234>240  LVAD interrogation reveals:  Speed: 2600 Flow: 4.5 Power: 4.0  w Alarms: none Peak:  7.3 Trough:  2.7 HCT: 31 Low flow alarm setting: 2.5 High watt alarm setting: 5.5  Suction: on  Lavare cycle: on  Blood Products: 6/28> 5 PRBC's, 6 FFP 7/1> 1 PRBC 7/9> 1 PRBC 7/13>3 PRBC 7/14>3 PRBC 7/15> 1 PRBC 7/16>3PRBC 7/17> 3 PRBC 7/20> 1 PRBC 8/6> 1 PRBC 8/10 > 1 PRBC  Gtts: Milrinone restarted 7/8, stopped 7/16 Levo stopped 04/26/17 restarted 05/08/2017 - stopped 05/07/17 Amiodarone stopped 04/29/17, restarted 7/17 - transitioned to PO 05/13/17; VT on 05/15/17 - IV amio re-started - 30 mg/hr stopped 05/16/17 Heparin 2600 u/hr Lidocaine 05/07/17 - stopped 7/19  TPN - started 04/24/17 for nutritional support, stopped 04/30/17  NG inserted with tube feeding started 05/27/17; placed on hold 05/28/17  Cortrak inserted 06/07/17 - started TF 05/29/17  Neuro: 05/27/17- Neurology consult for lethargy, mouth tremors, not vocal, not following commands> Head CT and EEG 05/28/17 - Continual EEG monitoring  05/30/17 - started Depakote for seizure activity - stopped 06/03/17 for rash - will start Keppra   Arrhythmia: 04/24/17 - Afib with RVR - started amiodarone 05/02/17 - 2 sustained episodes of VT - cardioverted x 1; second broke with overdrive pacing 7/17- wide complex tachycardia- Amio bolus x3 and gtt at 60 mg/hr 05/07/17 - sustained VT - converted with overdrive pacing; Lidocaine started in addition to amiodarone 05/08/17 - Lidocaine  stopped due to confusion (lido level 12.4) 05/14/17 - sustained VT - converted 3 separate occassions with overdrive pacing 05/19/17 - sustained VT-converted with overdrive pacing  05/26/17- Afib rate in the 50's 05/27/17 - NSR   Respiratory: 04/23/17 - re-intubated due to respiratory failure secondary to suspected aspiration pneumonia 04/25/17-extubated 05/17/2017- intubated for EGD 05/08/2017- extubated 05/28/17 - re-intubated for suspected aspiration pneumonia  Drive Line: Daily Dressing Kits with Aquacell AG silver strips per protocol - will advance to every other day dressing changes. Next dressing change due 05/30/17 unless drainage occurs; if so, please change as needed to keep dressing dry and intact.  Labs:  LDH trend: 205>212>185>167>167>199>165>151>159>163>181>219>189>173>158>156>156>146>154>188  INR trend: 2.13>2.45>2.25>2.64>2.83>2.92>2.48>2.05>1.93>2.09>2.90>3.15>2.74>2.26>2.09>1.73>1.52  Anticoagulation Plan: -INR Goal: 2-2.5  -ASA Dose: 325 mg- on hold  Adverse Events on VAD: - 04/06/2017 Return to [facility] high chest tube output, evacuation of mediastinal hematoma - 04/23/17 Ileus with vomiting; re-intubation for acute respiratory failure; probable aspiration pneumonia -7/13-GI bleed - 7/17 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 He had epinephrine, APC, and 2 clips.  - 7/19 - Lidocaine gtt stopped due to confusion (lido toxicity) -8/7- Neurology consult- lethargy, mouth tremors, not vocal, not following commands> Head CT and EEG, blood and urine cultures sent, Ammonia-20 8/8 - re-intubated for possible aspiration pneumonia   Plan/Recommendations:  1. Currently every other day dressing changes;  Wife perficient in performing dressing changes.  2. Family is anxious about financial considerations, we will have our social worker address this upon her return tomorrow. 2. Please call VAD pager with patient and equipment  concerns.   Carlton Adam  RN, VAD Coordinator 24/7 pager [phone]

## 2017-06-04 NOTE — Progress Notes (Signed)
PULMONARY / CRITICAL CARE MEDICINE   Name: James [name] MRN: [id number] DOB: [birth year]    ADMISSION DATE:  2017/04/21 CONSULTATION DATE:  04/23/2017  REFERRING MD:  Laneta Simmers - CVTS  CHIEF COMPLAINT:  Acute respiratory failure  BRIEF SUMMARY:  56 y.o. male with history of systolic congestive heart failure with EF 15%, coronary artery disease, chronic renal failure, diabetes, and essential hypertension admitted on 2017-04-21 for ventricular assist device placement. Patient previously turned down by [facility] for [facility] due to limitations in his mobility and his underlying gout. Course complicated by acute on chronic renal failure. He has had recurrent episodes of supraventricular tachycardia and GI bleeding. Patient was reintubated on 8/8 with concern for aspiration secondary to increased work of breathing and displaced NG tube. PCCM was consulted for help.  SUBJECTIVE:  No acute events overnight. No significant output from Lasix. Remains grossly volume overloaded. Patient tolerated pressure support weaning yesterday morning only. Lowest pressure support 8. Patient reports pain from his joints may be slightly improved.  REVIEW OF SYSTEMS:  Unable to obtain as patient is intubated.  VITAL SIGNS: BP 99/79   Pulse 61   Temp 98.4 F (36.9 C) (Axillary)   Resp 18   Ht 6' (1.829 m)   Wt 240 lb 4.8 oz (109 kg)   SpO2 100%   BMI 32.59 kg/m   HEMODYNAMICS: CVP:  [2 mmHg-19 mmHg] 16 mmHg  VENTILATOR SETTINGS: Vent Mode: PRVC FiO2 (%):  [30 %] 30 % Set Rate:  [14 bmp] 14 bmp Vt Set:  [620 mL] 620 mL PEEP:  [5 cmH20] 5 cmH20 Pressure Support:  [8 cmH20] 8 cmH20 Plateau Pressure:  [15 cmH20-16 cmH20] 16 cmH20  INTAKE / OUTPUT: I/O last 3 completed shifts: In: 4024.1 [I.V.:554.1; Other:120; NG/GT:3040; IV Piggyback:310] Out: 1380 [Urine:1120; Stool:260]  PHYSICAL EXAMINATION: General:  No distress. Wife and son at bedside. Integument:  Warm. Dry. No rash on exposed  skin. HEENT: Moist mucous membranes. No scleral icterus. Endotracheal tube in place. Cardiovascular:  Anasarca. Regular rate. No appreciable JVD. LVAD in place. Pulmonary:  Unable to auscultate breath sounds with hum of LVAD. Overall good aeration bilaterally. Symmetric chest wall rise on ventilator. Abdomen: Nondistended. Normal bowel sounds. Soft. Musculoskeletal:  Diffuse joint swelling and deformities of the hands. Muscle wasting. Neurological:  Continues to nod to questions. Grossly nonfocal.  LABS:  BMET  Recent Labs Lab 06/02/17 0321 06/03/17 0421 06/04/17 0518  NA 144 143 143  K 4.4 5.3* 6.0*  CL 114* 113* 112*  CO2 24 24 23   BUN 65* 78* 82*  CREATININE 2.43* 2.32* 2.33*  GLUCOSE 132* 196* 227*   Electrolytes  Recent Labs Lab 05/29/17 0216  05/31/17 0322  06/02/17 0321 06/03/17 0421 06/04/17 0518  CALCIUM 7.6*  < > 7.8*  < > 7.6* 7.9* 7.9*  MG 2.0  --  2.0  --   --   --   --   < > = values in this interval not displayed. CBC  Recent Labs Lab 06/02/17 0321 06/03/17 0421 06/04/17 0518  WBC 22.2* 23.6* 25.0*  HGB 9.3* 9.7* 10.2*  HCT 28.8* 30.6* 31.9*  PLT 260 270 323   Coag's  Recent Labs Lab 06/02/17 0321 06/03/17 0421 06/04/17 0518  INR 2.09 1.73 1.52   Sepsis Markers  Recent Labs Lab 05/28/17 1017 05/29/17 0216 05/29/17 0230 05/30/17 0310  LATICACIDVEN  --   --  1.1  --   PROCALCITON 0.99 1.49  --  1.33  ABG  Recent Labs Lab 05/28/17 1132  PHART 7.352  PCO2ART 37.6  PO2ART 360.0*   Liver Enzymes  Recent Labs Lab 06/03/17 1233 06/04/17 0518  AST 62* 52*  ALT 26 23  ALKPHOS 193* 174*  BILITOT 0.8 0.8  ALBUMIN 1.8* 1.7*    Cardiac Enzymes No results for input(s): TROPONINI, PROBNP in the last 168 hours.  Glucose  Recent Labs Lab 06/03/17 0806 06/03/17 1130 06/03/17 1633 06/03/17 1927 06/03/17 2348 06/04/17 0347  GLUCAP 167* 120* 206* 194* 215* 215*    Imaging No results found.   IMAGING/STUDIES: CT  HEAD W/O 8/10: IMPRESSION: 1. No acute intracranial process. 2. 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 PORT CXR 8/11:  Previously reviewed by me. No focal opacity. Right upper extremity central venous catheter in place. Endotracheal tube in good positioning. Enteric feeding tube coursing below diaphragm. PORT CXR 8/14:  Personally reviewed by me. LVAD in place. No new focal opacity. No pleural effusion appreciated. Endotracheal tube and central venous catheter in good position. Enteric feeding tube coursing below diaphragm. Persistent silhouetting of left hemidiaphragm and lower lung opacity.  MICROBIOLOGY: MRSA PCR 6/22:  Positive Blood Cultures x2 7/5:  Negative  Tracheal Aspirate Culture 7/5:  Citrobacter koseri & Klebsiella pneumoniae  Tracheal Aspirate Culture 7/7:  Klebsiella pneumoniae MRSA PCR 7/26:  Negative  Urine Culture 8/7:  Multiple Species  Blood Cultures x3 8/7:  Negative  Tracheal Aspirate Culture 8/8:  Multiple Organisms Present Urine Culture 8/9:  Negative Blood Cultures x2 8/11 >>> Tracheal Aspirate Culture 8/11 >>> Blood Cultures x2 8/12 >>>  ANTIBIOTICS: Zinacef 6/28 (periop) Zosyn 7/5 - 7/13 Vancomycin 7/5 - 7/7; 8/8 >>> Merrem 8/8 >>>  SIGNIFICANT EVENTS: 06/22 - Admit  06/28 - Implanting of LVAD 07/05 - Respiratory arrest post feculent vomiting episode 07/06 - Extubated 07/17 - Intubated during EGD 07/18 - Extubated 08/08 - Respiratory distress & intubated. Lasix x2 doses given. 08/13 - Worsened renal function w/ Lasix w/o significant diuresis. Weaned on PS some.  08/14 - Lasix given by Cardiology  LINES/TUBES: OETT 6/28 - 6/29 ; 7/5 - 7/6; 7/17-7/18; 8/8 >>> RUE TL PICC 7/2 >>> R NGT >>> FOLEY >>> RECTAL TUBE >>>  ASSESSMENT / PLAN:    PULMONARY A: Acute, Recurrent Hypoxic Respiratory Failure Aspiration Pneumonia:  Secondary to tube feedings.  P:    Continuing daily pressure support wean Per wife no plan for tracheostomy Full ventilator support for rest Plan for one way extubation when family is ready or at the 2 week mark to allow for further optimization  CARDIOVASCULAR A:  Acute on chronic systolic congestive heart failure: Turned down for heart transplant. Status post LVAD. Atrial fibrillation Recurrent ventricular tachycardia Mediastinal hematoma: Status post evacuation post-LVAD. H/O essential hypertension  P:  Continuing to monitor vitals per unit protocol Monitoring patient with continuous telemetry Management per cardiology & cardiothoracic surgery  RENAL A:   Acute on chronic renal failure stage III: No clinical worsening.  Hyperkalemia: Treated.  P:   Kayexalate 30 g via tube 1 Albumin & Lasix for diuresis today Monitoring urine output with Foley Trending renal function and electrolytes daily  GASTROINTESTINAL A:   Severe protein-calorie malnutrition Chronic mesenteric ischemia  P:   Nothing by mouth  HEMATOLOGIC A:   Anemia: Multifactorial. Secondary to blood loss from mediastinal hematoma. Leukocytosis: Presumably secondary to sepsis versus gout flare. Coagulopathy: Secondary to systemic and coagulation.   P:  Trending cell counts  daily Trending INR daily Transfusion goal per primary service  INFECTIOUS A:   Aspiration pneumonia FUO: Cultures repeatedly negative.  P:   Continuing empiric antibiotics as above Awaiting finalization of culture results Plan to repeat for any fever  ENDOCRINE A:   Diabetes Mellitus Type 2:  Glucose controlled. Hypothyroidism    P:   Continuing Synthroid via tube daily Accu-Cheks every 4 hours Sliding-scale insulin per algorithm  NEUROLOGIC/MUSCULOSKELETAL A:   Sedation on ventilator Gout  P:   Desired RASS:  0 to -1 Continuing Colchicine (started 8/13) Holding further sedation & analgesia pending extubation  Prophylaxis: Protonix via tube  twice a day & systemic anticoagulation. Diet:  NPO.  Continuing tube feedings per dietary recommendations. Code Status:  Partial Code as per previous physician discussions. Disposition:  Remains critically ill in the ICU. No trach per wife today. Plan for a one-way extubation when wife/patient is ready. Family Update:  Wife and son updated during rounds today.  DISCUSSION:  56 y.o.  male status post LVAD. Tolerating some pressure support wean but given recurrent intubation feel patient would be unsafe to extubate given potential for complications was reintubation. Planning for either tracheostomy placement or one way extubation when family is ready and patient is medically optimized as much as possible. Respiratory muscle weakness is a significant barrier to patient's safe extubation. Palliative medicine following.  Remainder of care as per primary service and other consultants.  I have spent a total of 35 minutes of critical care time today caring for the patient, updating family at bedside, and reviewing the patient's electronic medical record.   Donna ChristenJennings E. Jamison NeighborNestor, M.D. [facility] Pager:  [phone] After 3pm or if no response, call [phone] 06/04/2017, 7:53 AM

## 2017-06-04 NOTE — Progress Notes (Signed)
   06/04/17 1125  Clinical Encounter Type  Visited With Patient and family together  Visit Type Follow-up;Psychological support;Spiritual support;Social support;Post-op;Critical Care  Referral From Chaplain  Consult/Referral To Chaplain  Recommendations (Continual care for long hospital stay)   allow space for wife to reflect and talk about this journey  Ministry of presence  Helping wife to care for herself in this process

## 2017-06-04 NOTE — Progress Notes (Addendum)
Nutrition Follow Up   DOCUMENTATION CODES:   Severe malnutrition in context of chronic illness  INTERVENTION:    Continue Glucerna 1.2 formula at goal rate of 75 ml/hr  TF regimen providing 2160 kcals, 108 gm protein, 1449 ml of free water  NUTRITION DIAGNOSIS:   Malnutrition (severe) related to chronic illness (CHF) as evidenced by severe depletion of body fat, severe depletion of muscle mass, ongoing  GOAL:   Patient will meet greater than or equal to 90% of their needs, met  MONITOR:   Vent status, TF tolerance, Diet advancement, PO intake, Labs, Weight trends, Skin, I & O's  ASSESSMENT:   56 yo male with hx of HTN, gout, obesity, chronic edema, CAD, ischemic cardiomyopathy, DM2, AICD, MI, CHF, renal insufficiency who was admitted on 6/22 with acute on chronic systolic heart failure.  6/28  HVAD placement 6/29  Extubated 7/01  Soft diet started, Ensure Enlive added 7/04  Re-intubated 7/05  TPN started 7/06  Extubated 7/11  TPN D/C'd 8/08  Cortrak placed  Patient is currently intubated on ventilator support MV: 10.4 L/min Temp (24hrs), Avg:98.5 F (36.9 C), Min:97.8 F (36.6 C), Max:99.1 F (37.3 C)  Glucerna 1.2 formula currently infusing at goal rate of 75 ml/h via Cortrak feeding tube. Pt tolerating well. PCCM and Heart Failure Team notes reviewed. Pt would not want tracheostomy. Medications reviewed and include Mag-Ox, Colace and Levothroid.  Labs reviewed. K 6.0 (H). Cl 112 (H). CBG's 215-215-171.  Diet Order:  Diet NPO time specified  Skin:  unstageable pressure injury; sacrum   Last BM:  8/14   Height:   Ht Readings from Last 1 Encounters:  05/31/17 6' (1.829 m)   Weight:   Wt Readings from Last 1 Encounters:  06/04/17 240 lb 4.8 oz (109 kg)  Admit weight 210 lb (95.3 kg)  Ideal Body Weight:  80.9 kg  BMI:  Body mass index is 32.59 kg/m. highly skewed   Estimated Nutritional Needs:   Kcal:  2092  Protein:  115-130 gm  Fluid:  per  MD  EDUCATION NEEDS:   No education needs identified at this time  Arthur Holms, RD, LDN Pager #: [phone] After-Hours Pager #: [phone]

## 2017-06-04 NOTE — Progress Notes (Signed)
ANTICOAGULATION CONSULT NOTE - Follow Up Consult  Pharmacy Consult for Heparin Indication:  HVAD  No Active Allergies  Patient Measurements: Height: 6' (182.9 cm) Weight: 240 lb 4.8 oz (109 kg) IBW/kg (Calculated) : 77.6  Vital Signs: Temp: 98.6 F (37 C) (08/15 1600) Temp Source: Axillary (08/15 1600) BP: 102/81 (08/15 1800) Pulse Rate: 60 (08/15 1800)  Labs:  Recent Labs  06/02/17 0321 06/03/17 0421  06/03/17 2140 06/04/17 0518 06/04/17 0930 06/04/17 1600 06/04/17 1750  HGB 9.3* 9.7*  --   --  10.2*  --   --   --   HCT 28.8* 30.6*  --   --  31.9*  --   --   --   PLT 260 270  --   --  323  --   --   --   LABPROT 23.8* 20.5*  --   --  18.4*  --   --   --   INR 2.09 1.73  --   --  1.52  --   --   --   HEPARINUNFRC  --   --   < > <0.10*  --  <0.10*  --  <0.10*  CREATININE 2.43* 2.32*  --   --  2.33*  --  2.19*  --   < > = values in this interval not displayed.  Estimated Creatinine Clearance: 49.2 mL/min (A) (by C-G formula based on SCr of 2.19 mg/dL (H)).   Assessment: 56 year old male continues on heparin s/p HVAD 6/28 Heparin level remains undetectable  Goal of Therapy:  Heparin level = 0.3-0.5 Monitor platelets by anticoagulation protocol: Yes   Plan:  Increase heparin to 2900 units / hr Follow up AM labs  Thank you Okey RegalLisa Mindie Rawdon, PharmD [phone]  06/04/2017,7:11 PM

## 2017-06-04 NOTE — Progress Notes (Signed)
OT Cancellation Note  Patient Details Name: James [name] MRN: [id number] DOB: Feb 11, 1961   Cancelled Treatment:    Reason Eval/Treat Not Completed: Fatigue/lethargy limiting ability to participate.  Attempted to see pt for bed level exercise, however, pt unable to sustain arousal.  He will flutter his eyes open several times, then falls back to sleep.   Will continue attempts.  Sandie Swayze Ratamosaonarpe, OTR/L [phone]   [name], Julie-Anne Torain M 06/04/2017, 4:00 PM

## 2017-06-04 NOTE — Progress Notes (Signed)
Palliative:   Mr. [name] continues on full vent support, no family at bedside. He is nodding head yes/no appropriately today. No family at bedside. I called and spoke briefly with wife, Elease HashimotoMarcie. Offered support. She tells me that the plan is to continue vent support for now to optimize him medically for extubation and likely extubate next week/Monday. She also is interested in speaking with someone to assist with some financial concerns she has next week.   No charge  Yong ChannelAlicia Xzandria Clevinger, NP Palliative Medicine Team Pager # [phone] (M-F 8a-5p) Team Phone # [phone] (Nights/Weekends)

## 2017-06-04 NOTE — Progress Notes (Signed)
Patient ID: James [name], male   DOB: 1960/11/01, 56 y.o.   MRN: [id number]   Advanced Heart Failure VAD Team Note  Subjective:    Events: --HVAD placed 6/28.  Returned to the OR that evening with high chest tube output, evacuation of mediastinal hematoma.  --Extubated 6/29. Milrinone stopped on 7/4. --7/4, patient developed ileus with respiratory compromise. NGT placed with 3 L suctioned out.  CXR with suspicion for aspiration PNA.  Patient had to be intubated.  He went into atrial fibrillation with RVR.  He became hypotensive and was started on norepinephrine and phenylephrine.  Amiodarone gtt begun.    --Extubated again on 7/6.  -- 7/13 Had 2 sustained episodes of VT. Had to be cardioverted 1. Second episode broke with overdrive pacing with Dr. Caryl Comes. VAD speed turned down to 2700.  --Melena due to acuteGI bleed --> 2 units PRBCs 7/14, 2 units 7/15. 3 U PRBCs 7/16,  04/20/2017 3 UPRBCs.  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  He had epinephrine, APC, and 2 clips. Intubated prior to procedure and placed on norepinephrine. Speed dropped to 2660.  --7/18 Back in VT with overdrive pacing. Lidocaine was started in addition to amiodarone. Extubated. --7/19 lidocaine stopped with confusion. Neuro consulted.  EEG normal.  CT of head no acute findings. 7/20 confusion had resolved.  --1 unit PRBCs 7/20.  --7/22, developed sustained VT with rate around 130 shortly after getting IV Lasix.  We were able to pace him out and back to NSR.  He was put back on amiodarone gtt.  --More VT on 7/25, paced out.  Back on IV amiodarone gtt.  NSR.  -- 7/31 Ramp ECHO decrease speed 2600 rpm.  -- 8/1 VT- paced out -- 8/2 VT - started on sotalol -- 8/6 Hgb 7.5, 1 unit PRBCs -- 8/7 Hgb 9.5, confused.  Head CT with right superior cerebellar artery lacunar infarcts, not acute. -- 8/8 Possible aspiration. Reintubated. -- 8/10 possible seizure => Depakote.  CT head no  change.  1 unit PRBCs.   Remains intubated, awake on vent and responds appropriately.  Norepinephrine back on overnight at 5 but MAP in 90s.  CVP 7-8.  Got Lasix yesterday IV x 2 but had suction event overnight.  UOP was not vigorous, weight higher.  Creatinine 2.43 => 2.32 => 2.33. WBCs 26.7 => 23.6 => 22 => 23.6 => 25.  INR down to 1.5, on heparin gtt.   Afebrile.  Continues on vancomycin/meropenem for suspected sepsis, cultures still negative.  With ?drug rash, Depakote stopped yesterday and Keppra started.    Vent weaned better yesterday.    HVAD INTERROGATION:  HVAD:  Flow 4.3 liters/min, speed 2600,  power 4.0 W,  Peak 7.1 Trough 2.6. No alarms. Suction On. Lavare On.   Suction event at ~0540. Will send log.   Objective:    Vital Signs:   Temp:  [98.4 F (36.9 C)-99.1 F (37.3 C)] 98.4 F (36.9 C) (08/15 0330) Pulse Rate:  [55-62] 61 (08/15 0700) Resp:  [15-30] 18 (08/15 0700) BP: (69-111)/(49-97) 99/79 (08/15 0700) SpO2:  [95 %-100 %] 100 % (08/15 0700) FiO2 (%):  [30 %] 30 % (08/15 0320) Weight:  [240 lb 4.8 oz (109 kg)] 240 lb 4.8 oz (109 kg) (08/15 0400) Last BM Date: 06/03/17 Mean arterial Pressure 70s-90s Intake/Output:   Intake/Output Summary (Last 24 hours) at 06/04/17 0759 Last data filed at 06/04/17 0700  Gross per 24 hour  Intake  3046 ml  Output              960 ml  Net             2086 ml     Physical Exam  CVP 7-8  Physical Exam: GENERAL: Intubated. Opens eyes.   HEENT: +ETT. Cor-track NECK: Supple, JVP 8 cm. Carotids OK.  CARDIAC:  Mechanical heart sounds with LVAD hum present.  LUNGS:  Bilateral rhonchi.  ABDOMEN:  NT, ND, no HSM. No bruits or masses. +BS  LVAD exit site: Well-healed and incorporated. Dressing dry and intact. No erythema or drainage. Stabilization device present and accurately applied. Driveline dressing changed daily per sterile technique. EXTREMITIES:  Warm and dry. No cyanosis, clubbing, or rash. R and LLE 2+ edema to  knees. NEUROLOGIC:  Intubated. Follows commands.  SKIN: Sacrum with unstageable pressure ulcer. Faint erythematous rash abdomen and thighs.   Telemetry   Personally reviewed, ? Atrial fibrillation with v pacing at 60.   Labs   Basic Metabolic Panel:  Recent Labs Lab 05/29/17 0216  05/31/17 0322 06/01/17 0323 06/02/17 0321 06/03/17 0421 06/04/17 0518  NA 140  < > 143 143 144 143 143  K 4.3  < > 4.2 4.4 4.4 5.3* 6.0*  CL 111  < > 113* 114* 114* 113* 112*  CO2 19*  < > 21* _0 GLUCOSE 104*  < > 210* 153* 132* 196* 227*  BUN 51*  < > 59* 63* 65* 78* 82*  CREATININE 2.52*  < > 2.47* 1.72* 2.43* 2.32* 2.33*  CALCIUM 7.6*  < > 7.8* 7.8* 7.6* 7.9* 7.9*  MG 2.0  --  2.0  --   --   --   --   < > = values in this interval not displayed.  Liver Function Tests:  Recent Labs Lab 06/03/17 1233 06/04/17 0518  AST 62* 52*  ALT 26 23  ALKPHOS 193* 174*  BILITOT 0.8 0.8  PROT 4.9* 5.0*  ALBUMIN 1.8* 1.7*   No results for input(s): LIPASE, AMYLASE in the last 168 hours. No results for input(s): AMMONIA in the last 168 hours.  CBC:  Recent Labs Lab 05/31/17 0322 06/01/17 0323 06/02/17 0321 06/03/17 0421 06/04/17 0518  WBC 26.7* 23.6* 22.2* 23.6* 25.0*  NEUTROABS  --   --  20.0*  --   --   HGB 10.0* 10.0* 9.3* 9.7* 10.2*  HCT 30.5* 30.7* 28.8* 30.6* 31.9*  MCV 88.2 87.5 87.8 88.2 88.1  PLT 305 295 260 270 323    INR:  Recent Labs Lab 05/31/17 0322 06/01/17 0323 06/02/17 0321 06/03/17 0421 06/04/17 0518  INR 2.74 2.25 2.09 1.73 1.52    Other results:     Imaging   Dg Chest Port 1 View  Result Date: 06/03/2017 CLINICAL DATA:  Acute respiratory failure with hypoxemia. EXAM: PORTABLE CHEST 1 VIEW COMPARISON:  Radiograph of June 01, 2017. FINDINGS: Stable cardiomediastinal silhouette. Endotracheal and feeding tubes are unchanged in position. Left ventricular assistance device is unchanged. Left-sided pacemaker is unchanged. No pneumothorax is  noted. Stable bibasilar atelectasis is noted. Right-sided PICC line is unchanged in position. No pneumothorax is noted. Bony thorax is unremarkable. IMPRESSION: Stable support apparatus. Left ventricular assistance device is again noted. Stable bibasilar atelectasis. Electronically Signed   By: Marijo Conception, M.D.   On: 06/03/2017 07:38     Medications:     Scheduled Medications: . amiodarone  200 mg Per Tube BID  . chlorhexidine  gluconate (MEDLINE KIT)  15 mL Mouth Rinse BID  . Chlorhexidine Gluconate Cloth  6 each Topical Q0600  . colchicine  0.6 mg Oral Daily  . collagenase   Topical Daily  . febuxostat  120 mg Oral Daily  . insulin aspart  0-15 Units Subcutaneous Q4H  . levETIRAcetam  500 mg Per Tube BID  . levothyroxine  50 mcg Per NG tube QAC breakfast  . magnesium oxide  400 mg Per Tube Daily  . mouth rinse  15 mL Mouth Rinse 10 times per day  . mexiletine  300 mg Per Tube Q12H  . pantoprazole sodium  40 mg Per Tube BID  . predniSONE  20 mg Oral Q breakfast  . sodium chloride flush  10-40 mL Intracatheter Q12H  . sodium polystyrene  30 g Per Tube Once  . sotalol  80 mg Per Tube Daily    Infusions: . feeding supplement (GLUCERNA 1.2 CAL) 1,000 mL (06/04/17 0700)  . heparin 2,300 Units/hr (06/04/17 0700)  . meropenem (MERREM) IV Stopped (06/03/17 2147)  . norepinephrine (LEVOPHED) Adult infusion 5.013 mcg/min (06/04/17 0700)    PRN Medications: acetaminophen (TYLENOL) oral liquid 160 mg/5 mL, albuterol, docusate, hydrALAZINE, neomycin-bacitracin-polymyxin, ondansetron (ZOFRAN) IV, oxyCODONE, phenol, sodium chloride flush   Patient Profile   56 yo with CAD s/p CABG, ischemic cardiomyopathy/chronic systolic CHF, tophaceous gout, and CKD stage 3 was admitted for diuresis and consideration for LVAD placement. S/p HVAD on 6/28  Assessment/Plan:    1. Acute/chronic systolic CHF s/p HVAD: Ischemic cardiomyopathy.  St Jude ICD.  Echo (6/18) with EF 15%, mildly dilated RV  with moderately decreased systolic function.  s/p HVAD placement 6/28 and had to return to OR to evacuate mediastinal hematoma.  He had been weaned off pressors/milrinone, but developed ileus w/ likely aspiration event 7/4, re-intubated, developed afib/RVR requiring norepinephrine. Extubated 7/6.  VAD speed turned down to 2600 with VT. VAD parameters currently look good.  Milrinone stopped 7/17 with recurrent VT.  He was started back on norepinephrine 8/9 due to soft BP after intubation and suspected septic shock.  Now on norepinephrine at 5 but MAP in 90s, CVP 7-8, co-ox 86%.   Tried to diurese yesterday with IV Lasix but UOP not vigorous and had suction event this morning.  Suspect 3rd-spacing with low albumin, his central blood volume is not high as evidenced by low CVP and it is going to be hard to mobilize his edema fluid.  - Will give dose of albumin 25 g today with Lasix 40 mg IV x 1.  - MAP elevated currently, should be able to wean norepinephrine.  - Off ASA with GI Bleed  - Goal INR 2-2.5. Today's INR down to 1.52 => Covering with heparin.  2. AKI on CKD stage 3: Creatinine fairly stable, though not clearing potassium. Will get dose of Kayexalate today.  Continue to follow closely, BMET in pm.  3. Symptomatic anemia due to acute UGI bleeding:  Received 3 units PRBCs 7/16 and 3 units PRBCs on 04/21/2017. 1 unit PRBCs 7/20. 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  Requiring 2 clips + epi + APC. 1 unit PRBCs 8/6.  He had 1 unit PRBCs 8/10 to provide volume.  Hemoglobin up to 10.2.   - Got Feraheme 7/28.  - Continue Protonix 40 po bid. - Off aspirin for now.  - Transfuse hgb < 8 4. Ventricular tachycardia: Status post ventricular tachycardia 2 on 7/13. Possible suction event. VAD speed  turned down to 2700.  Recurrent VT 7/17. EP called to bedside. Overdrive pacing successful for about 10 minutes but VT recurred.  Milrinone turned off, remained in VT overnight but  hemodynamically stable.  Speed decreased to 2600.  On 7/18, we were able to pace him out of VT.  Lidocaine added then stopped due to confusion/mental status changes.  He had VT 7/22 about 30 minutes after getting IV Lasix, had to be paced out again => may have been due to rapid fluid shift.  Pt paced out of VT 3 separate occasions 7/25. Got amio bolus x 2 and started back on IV infusion.  Transitioned to amiodarone 400 mg tid.  On 8/1 had recurrent VT and paced out again.  Recurrent VT 8/2 per EP sotalol started along with amiodarone, sotalol decreased to once daily with bradycardia.   - No VT over the last day. Continue mexiletine to 300 mg BID, sotalol 80 mg daily, amio 200 mg BID. - Will have to watch for VT with IV Lasix.  5. CAD: s/p CABG 2012. No chest pain.  Off aspirin due to GI bleeding. No change.  6. Gout: Severe tophaceous gout. Left shoulder pain ?gout.     - Continue uloric. No change.  7. Atrial fibrillation: Developed atrial fibrillation with RVR in setting of aspiration PNA on 7/4.    Hard to know with interference from [place], may currently be in rate-controlled afib.    8. Malnutrition: Continue tube feeds via Cor-track. Nutrition following.   9. Aspiration PNA with acute hypoxemic respiratory failure on 7/4: He was extubated on 7/6. Tracheal aspirate with Klebsiella and citrobacter. Both sensitive to Zosyn. Completed abx 7/13. Possible aspiration 8/8.  - On vanco/meropenum.    10. Ileus: Resolve.   11. Deconditioning:  Plan for CIR when stable. OT/PT appreciated. CIR on hold. Reconsult once extubated if tolerates.  12. Acute Respiratory Failure: Intubated 7/17 in setting of complex GI intervention. Extubated 7/18. Re-intubated 8/8.  Weak respiratory muscles. He has been making progress with vent wean (better yesterday).  Weaning again today => he would not want tracheostomy, will continue wean towards extubation as long as he is making progress.  If he stops making progress, will need  to discuss comfort care.  13. Delirium: Resolved. No further. Appreciate neuro input Possibly related to lidocaine.  Lidocaine stopped. CT of head negative. EEG ok. No further workup per neuro. Neuro  reconsulted 8/7 with AMS => ?related to fever/infection.  He has been more clear on vent.   - CT head 05/27/17 and 8/10 with lacunar infarcts but do not appear acute.   - Seizure activity versus myoclonus => Depakote started.  Switched to [place] with ?drug rash from Depakote.   14. Insomnia: On remeron for sleep and depression. No change.  15. Post-op depression: Remeron started 7/28. No change 16. UTI: on vancomycin and meropenum. Urine culture- No growth. .  17. Unstageable Pressure Ulcer: Sacrum. Consult WOC Needs to start santyl daily to enzymatically debride. Reposition R to L. WOC recommendations appreciated.    18. Bradycardia: With hypotension.  Currently back-up pacing set at 60 bpm.  Does not have atrial lead so pacing asynchronously.  Currently, on and off pacing at rate 60.   19. Hypothyroidism: May be related to amiodarone, he is on Levoxyl. No change.  20. ID: Suspected septic shock, source = pressure ulcer versus UTI.  Afebrile now, WBCs remain elevated, BP improving.  Continue meropenem/vancomycin. Cultures so far negative.   -  Faint rash, ?drug eruption (may be due to meropenem versus depakote).  Depakote switched out for Keppra.  Follow for now, may need to switch to aztreonam if no improvement. 21. Rash: ?Drug eruption.  Has not worsened and is faint.  Meropenem versus Depakote may be the culprits. - Depakote stopped yesterday and switched for Keppra per Neuro recommendation.  22. Hyperkalemia - Will give 30 g Kayexelate this am. Will plan on giving 40 mg IV lasix later this am with 25 g of albumin prior to.   I reviewed the HVAD parameters from today, and compared the results to the patient's prior recorded data.  No programming changes were made.  The HVAD is functioning within  specified parameters.    Shirley Friar, PA-C 06/04/2017, 7:59 AM  VAD Team --- VAD ISSUES ONLY--- Pager [phone] (7am - 7am) Advanced Heart Failure Team  Pager [phone] (M-F; 7a - 4p)  Please contact [facility] for night-coverage after hours (4p -7a ) and weekends on [email or web address]  Patient seen with PA, agree with the above note.   He is awake on vent.  JVP about 8 cm 2+ edema to knees Normal LVAD sounds Lungs with decreased breath sounds at bases.   Concern for septic shock, possibly from pressure ulcer as source.  No culture data.  Afebrile now but WBCs high.  MAP good, should be able to titrate down on norepinephrine today.  Continue vancomycin/meropenem.   He did not diurese well yesterday with IV Lasix but CVP only 7-8 today.  Significant peripheral edema.  I suspect a lot of this is 3rd-spacing with low albumin.  It is going to be hard to mobilize this fluid.  Suction event on VAD was likely due to IV Lasix with relatively low CVP.  Will try today with albumin + a dose of IV Lasix.   Depakote stopped yesterday and Keppra begun because of ?drug rash.   Creatinine stable at 2.3 but K elevated.  Will give Kayexalate, repeat BMET in pm.   He weaned better yesterday on vent.  Will wean again today.  He would not want tracheostomy.  As long as we are making progress with vent wean, will continue towards extubation.  If he stops making progress, will need to discuss palliative care.  Discussed with wife and son at bedside.   CRITICAL CARE Performed by: Loralie Champagne  Total critical care time: 40 minutes  Critical care time was exclusive of separately billable procedures and treating other patients.  Critical care was necessary to treat or prevent imminent or life-threatening deterioration.  Critical care was time spent personally by me on the following activities: development of treatment plan with patient and/or surrogate as well as nursing, discussions with  consultants, evaluation of patient's response to treatment, examination of patient, obtaining history from patient or surrogate, ordering and performing treatments and interventions, ordering and review of laboratory studies, ordering and review of radiographic studies, pulse oximetry and re-evaluation of patient's condition.  Loralie Champagne 06/04/2017 9:21 AM

## 2017-06-05 LAB — BASIC METABOLIC PANEL
Anion gap: 7 (ref 5–15)
Anion gap: 7 (ref 5–15)
BUN: 90 mg/dL — AB (ref 6–20)
BUN: 91 mg/dL — AB (ref 6–20)
CALCIUM: 7.9 mg/dL — AB (ref 8.9–10.3)
CHLORIDE: 113 mmol/L — AB (ref 101–111)
CO2: 26 mmol/L (ref 22–32)
CO2: 24 mmol/L (ref 22–32)
Calcium: 7.8 mg/dL — ABNORMAL LOW (ref 8.9–10.3)
Chloride: 115 mmol/L — ABNORMAL HIGH (ref 101–111)
Creatinine, Ser: 2.29 mg/dL — ABNORMAL HIGH (ref 0.61–1.24)
Creatinine, Ser: 2.24 mg/dL — ABNORMAL HIGH (ref 0.61–1.24)
GFR calc Af Amer: 36 mL/min — ABNORMAL LOW (ref >60)
GFR calc non Af Amer: 31 mL/min — ABNORMAL LOW (ref >60)
GFR, EST AFRICAN AMERICAN: 36 mL/min — AB (ref >60)
GFR, EST NON AFRICAN AMERICAN: 31 mL/min — AB (ref >60)
GLUCOSE: 226 mg/dL — AB (ref 65–99)
GLUCOSE: 207 mg/dL — AB (ref 65–99)
POTASSIUM: 5.9 mmol/L — AB (ref 3.5–5.1)
POTASSIUM: 6.1 mmol/L — AB (ref 3.5–5.1)
SODIUM: 146 mmol/L — AB (ref 135–145)
Sodium: 146 mmol/L — ABNORMAL HIGH (ref 135–145)

## 2017-06-05 LAB — CULTURE, BLOOD (ROUTINE X 2)
CULTURE: NO GROWTH
CULTURE: NO GROWTH
Special Requests: ADEQUATE
Special Requests: ADEQUATE

## 2017-06-05 LAB — CBC
HEMATOCRIT: 30.6 % — AB (ref 39.0–52.0)
Hemoglobin: 9.8 g/dL — ABNORMAL LOW (ref 13.0–17.0)
MCH: 28.5 pg (ref 26.0–34.0)
MCHC: 32 g/dL (ref 30.0–36.0)
MCV: 89 fL (ref 78.0–100.0)
Platelets: 312 10*3/uL (ref 150–400)
RBC: 3.44 MIL/uL — ABNORMAL LOW (ref 4.22–5.81)
RDW: 18.5 % — AB (ref 11.5–15.5)
WBC: 16.1 10*3/uL — ABNORMAL HIGH (ref 4.0–10.5)

## 2017-06-05 LAB — COOXEMETRY PANEL
CARBOXYHEMOGLOBIN: 1.5 % (ref 0.5–1.5)
Methemoglobin: 0.9 % (ref 0.0–1.5)
O2 SAT: 71.1 %
Total hemoglobin: 10.2 g/dL — ABNORMAL LOW (ref 12.0–16.0)

## 2017-06-05 LAB — HEPARIN LEVEL (UNFRACTIONATED)
HEPARIN UNFRACTIONATED: 0.17 [IU]/mL — AB (ref 0.30–0.70)
HEPARIN UNFRACTIONATED: 0.51 [IU]/mL (ref 0.30–0.70)
Heparin Unfractionated: 0.13 IU/mL — ABNORMAL LOW (ref 0.30–0.70)

## 2017-06-05 LAB — PROTIME-INR
INR: 1.4
Prothrombin Time: 17.3 seconds — ABNORMAL HIGH (ref 11.4–15.2)

## 2017-06-05 LAB — BRAIN NATRIURETIC PEPTIDE: B Natriuretic Peptide: 2186 pg/mL — ABNORMAL HIGH (ref 0.0–100.0)

## 2017-06-05 LAB — GLUCOSE, CAPILLARY
GLUCOSE-CAPILLARY: 197 mg/dL — AB (ref 65–99)
Glucose-Capillary: 145 mg/dL — ABNORMAL HIGH (ref 65–99)
Glucose-Capillary: 176 mg/dL — ABNORMAL HIGH (ref 65–99)
Glucose-Capillary: 212 mg/dL — ABNORMAL HIGH (ref 65–99)
Glucose-Capillary: 220 mg/dL — ABNORMAL HIGH (ref 65–99)
Glucose-Capillary: 226 mg/dL — ABNORMAL HIGH (ref 65–99)

## 2017-06-05 LAB — LACTATE DEHYDROGENASE: LDH: 142 U/L (ref 98–192)

## 2017-06-05 MED ORDER — INSULIN GLARGINE 100 UNIT/ML ~~LOC~~ SOLN
15.0000 [IU] | Freq: Every day | SUBCUTANEOUS | Status: DC
  Administered 2017-06-05 – 2017-06-08 (×3): 15 [IU] via SUBCUTANEOUS
  Filled 2017-06-05 (×5): qty 0.15

## 2017-06-05 MED ORDER — FENTANYL CITRATE (PF) 100 MCG/2ML IJ SOLN
25.0000 ug | INTRAMUSCULAR | Status: DC | PRN
  Administered 2017-06-06 – 2017-06-09 (×9): 50 ug via INTRAVENOUS
  Administered 2017-06-09 (×2): 25 ug via INTRAVENOUS
  Administered 2017-06-10 – 2017-06-16 (×5): 50 ug via INTRAVENOUS
  Administered 2017-06-18 – 2017-06-19 (×2): 25 ug via INTRAVENOUS
  Administered 2017-06-20: 50 ug via INTRAVENOUS
  Administered 2017-06-20: 25 ug via INTRAVENOUS
  Administered 2017-06-21 (×4): 50 ug via INTRAVENOUS
  Administered 2017-06-21: 25 ug via INTRAVENOUS
  Administered 2017-06-22 – 2017-06-24 (×4): 50 ug via INTRAVENOUS
  Filled 2017-06-05 (×30): qty 2

## 2017-06-05 MED ORDER — SODIUM POLYSTYRENE SULFONATE 15 GM/60ML PO SUSP
30.0000 g | Freq: Once | ORAL | Status: AC
  Administered 2017-06-05: 30 g via ORAL
  Filled 2017-06-05: qty 120

## 2017-06-05 MED ORDER — SODIUM POLYSTYRENE SULFONATE 15 GM/60ML PO SUSP: 60.0000 g | Freq: Once | ORAL | Status: DC

## 2017-06-05 MED ORDER — PREDNISONE 5 MG/ML PO CONC
20.0000 mg | Freq: Every day | ORAL | Status: DC
  Administered 2017-06-06 – 2017-06-14 (×9): 20 mg
  Filled 2017-06-05 (×11): qty 4

## 2017-06-05 NOTE — Progress Notes (Signed)
PT Cancellation Note  Patient Details Name: James [name] MRN: [id number] DOB: 1960-12-01   Cancelled Treatment:    Reason Eval/Treat Not Completed: Medical issues which prohibited therapy (pt with recent suction event per RN and not currently appropriate)   [name] 06/05/2017, 10:46 AM [name], PT [phone]

## 2017-06-05 NOTE — Progress Notes (Signed)
ANTICOAGULATION CONSULT NOTE - Follow Up Consult  Pharmacy Consult for Heparin Indication:  HVAD  No Active Allergies  Patient Measurements: Height: 6' (182.9 cm) Weight: 240 lb 4.8 oz (109 kg) IBW/kg (Calculated) : 77.6  Vital Signs: Temp: 98.7 F (37.1 C) (08/16 1614) Temp Source: Oral (08/16 1614) BP: 105/93 (08/16 0900) Pulse Rate: 60 (08/16 1705)  Labs:  Recent Labs  06/03/17 0421  06/04/17 0518  06/04/17 1600  06/05/17 0434 06/05/17 1316 06/05/17 1532  HGB 9.7*  --  10.2*  --   --   --  9.8*  --   --   HCT 30.6*  --  31.9*  --   --   --  30.6*  --   --   PLT 270  --  323  --   --   --  312  --   --   LABPROT 20.5*  --  18.4*  --   --   --  17.3*  --   --   INR 1.73  --  1.52  --   --   --  1.40  --   --   HEPARINUNFRC  --   < >  --   < >  --   < > 0.13* 0.17* 0.51  CREATININE 2.32*  --  2.33*  --  2.19*  --  2.29*  --   --   < > = values in this interval not displayed.  Estimated Creatinine Clearance: 47 mL/min (A) (by C-G formula based on SCr of 2.29 mg/dL (H)).   Assessment: 56 year old male continues on heparin s/p HVAD 6/28 Heparin level has been low on high doses of heparin 3200 units/hr. Concerns over peripheral IV not infusing correctly despite adequate pull back when flushed and arm swelling the heparin drip was changed to infuse through the central line. Heparin drip 3400 uts/hr the heparin level quickly rose to 0.5 - at the top of the range With this quick jump and high dose will drop heparin rate and recheck later this evening to prevent accumulation or inadequate anticoagulation. CBC stable overnight. No overt bleeding noted.    Goal of Therapy:  Heparin level = 0.3-0.5 Monitor platelets by anticoagulation protocol: Yes   Plan:  Decrease heparin to 3000 units / hr - continue drip through central line and draw HL via peripheral stick Recheck HL 6hr after rate decrease and daily  Leota SauersLisa Jamiaya Bina Pharm.D. CPP, BCPS Clinical  Pharmacist [phone] 06/05/2017 5:41 PM

## 2017-06-05 NOTE — Progress Notes (Addendum)
ANTICOAGULATION CONSULT NOTE - Follow Up Consult  Pharmacy Consult for heparin Indication: HVAD  Labs:  Recent Labs  06/03/17 0421  06/04/17 0518 06/04/17 0930 06/04/17 1600 06/04/17 1750 06/05/17 0434  HGB 9.7*  --  10.2*  --   --   --  9.8*  HCT 30.6*  --  31.9*  --   --   --  30.6*  PLT 270  --  323  --   --   --  312  LABPROT 20.5*  --  18.4*  --   --   --  17.3*  INR 1.73  --  1.52  --   --   --  1.40  HEPARINUNFRC  --   < >  --  <0.10*  --  <0.10* 0.13*  CREATININE 2.32*  --  2.33*  --  2.19*  --  2.29*  < > = values in this interval not displayed.   Assessment: 56yo male remains subtherapeutic on heparin after rate adjustments though now detectable.  Goal of Therapy:  Heparin level 0.3-0.5 units/ml   Plan:  Will increase heparin gtt by 3 units/kg/hr (or 10% of current rate) to 3200 units/hr and check level in 8hr.  Vernard GamblesVeronda Danitza Schoenfeldt, PharmD, BCPS  06/05/2017,5:29 AM

## 2017-06-05 NOTE — Progress Notes (Signed)
LVAD Inpatient Coordinator Rounding Note:  Admitted 04/12/2017 due to A/C Heart failure.   [employer] LVAD implanted on 03/25/2017 by Dr. Laneta SimmersBartle as DT VAD.  Transferred to 4E 05/17/17.  Transferred back to ICU 2H09 on 05/28/17 with possible aspiration pneumonia requiring re-intubation.  Pt is currently on SBT tolerating well this morning.   Vital signs: Temp: 97.7 HR: 60 Doppler: 85 Auto BP: 105/93 (99) O2 Sat: 100% on 30% FIO2 and 5 peep Wt in lbs: Marland Kitchen... 230>229>226>224>235>221>217>223>224>224>234>240  LVAD interrogation reveals:  Speed: 2600 Flow: 4.4 Power: 3.9  w Alarms: none Peak:  6.8 Trough:  2.5 HCT: 30.6 Low flow alarm setting: 2 High watt alarm setting: 6  Suction: on  Lavare cycle: on  Blood Products: 6/28> 5 PRBC's, 6 FFP 7/1> 1 PRBC 7/9> 1 PRBC 7/13>3 PRBC 7/14>3 PRBC 7/15> 1 PRBC 7/16>3PRBC 7/17> 3 PRBC 7/20> 1 PRBC 8/6> 1 PRBC 8/10 > 1 PRBC  Gtts: Milrinone restarted 7/8, stopped 7/16 Levo stopped 04/26/17 restarted 05/20/2017 - stopped 05/07/17 Amiodarone stopped 04/29/17, restarted 7/17 - transitioned to PO 05/13/17; VT on 05/15/17 - IV amio re-started - 30 mg/hr stopped 05/16/17 Heparin 3200 u/hr Lidocaine 05/07/17 - stopped 7/19  TPN - started 04/24/17 for nutritional support, stopped 04/30/17  NG inserted with tube feeding started 05/27/17; placed on hold 05/28/17   Cortrak inserted 06/07/17 - started TF 05/29/17; restarted evening 8/15   Neuro: 05/27/17- Neurology consult for lethargy, mouth tremors, not vocal, not following commands> Head CT and EEG 05/28/17 - Continual EEG monitoring  05/30/17 - started Depakote for seizure activity - stopped 06/03/17 for rash - will start Keppra   Arrhythmia: 04/24/17 - Afib with RVR - started amiodarone 05/02/17 - 2 sustained episodes of VT - cardioverted x 1; second broke with overdrive pacing 7/17- wide complex tachycardia- Amio bolus x3 and gtt at 60 mg/hr 05/07/17 - sustained VT - converted with overdrive  pacing; Lidocaine started in addition to amiodarone 05/08/17 - Lidocaine stopped due to confusion (lido level 12.4) 05/14/17 - sustained VT - converted 3 separate occassions with overdrive pacing 05/19/17 - sustained VT-converted with overdrive pacing  05/26/17- Afib rate in the 50's 05/27/17 - NSR   Respiratory: 04/23/17 - re-intubated due to respiratory failure secondary to suspected aspiration pneumonia 04/25/17-extubated 05/08/2017- intubated for EGD 05/13/2017- extubated 05/28/17 - re-intubated for suspected aspiration pneumonia  Drive Line: Daily Dressing Kits with Aquacell AG silver strips per protocol - will advance to every other day dressing changes.  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 He had epinephrine, APC, and 2 clips.  - 7/19 - Lidocaine gtt stopped due to confusion (lido toxicity) -8/7- Neurology consult- lethargy, mouth tremors, not vocal, not following commands> Head CT and EEG, blood and urine cultures sent, Ammonia-20 8/8 - re-intubated for possible aspiration pneumonia   Plan/Recommendations:  1. Currently every other day dressing changes;  Wife perficient in performing dressing changes.  2. Please call VAD pager with patient and equipment concerns.   Carlton AdamSarah Vanesha Athens RN, VAD Coordinator 24/7 pager [phone]

## 2017-06-05 NOTE — Progress Notes (Signed)
L. FA IV appear to be infiltrated, warm compress applied. Heparin gtt moved to PICC line. Order placed for IV team consult. Will continue to monitor.

## 2017-06-05 NOTE — Progress Notes (Signed)
Patient ID: James [name], male   DOB: [birth year], 56 y.o.   MRN: [id number]   Advanced Heart Failure VAD Team Note  Subjective:    Events: --HVAD placed 6/28.  Returned to the OR that evening with high chest tube output, evacuation of mediastinal hematoma.  --Extubated 6/29. Milrinone stopped on 7/4. --7/4, patient developed ileus with respiratory compromise. NGT placed with 3 L suctioned out.  CXR with suspicion for aspiration PNA.  Patient had to be intubated.  He went into atrial fibrillation with RVR.  He became hypotensive and was started on norepinephrine and phenylephrine.  Amiodarone gtt begun.    --Extubated again on 7/6.  -- 7/13 Had 2 sustained episodes of VT. Had to be cardioverted 1. Second episode broke with overdrive pacing with Dr. Caryl Comes. VAD speed turned down to 2700.  --Melena due to acuteGI bleed --> 2 units PRBCs 7/14, 2 units 7/15. 3 U PRBCs 7/16,  04/29/2017 3 UPRBCs.  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  He had epinephrine, APC, and 2 clips. Intubated prior to procedure and placed on norepinephrine. Speed dropped to 2660.  --7/18 Back in VT with overdrive pacing. Lidocaine was started in addition to amiodarone. Extubated. --7/19 lidocaine stopped with confusion. Neuro consulted.  EEG normal.  CT of head no acute findings. 7/20 confusion had resolved.  --1 unit PRBCs 7/20.  --7/22, developed sustained VT with rate around 130 shortly after getting IV Lasix.  We were able to pace him out and back to NSR.  He was put back on amiodarone gtt.  --More VT on 7/25, paced out.  Back on IV amiodarone gtt.  NSR.  -- 7/31 Ramp ECHO decrease speed 2600 rpm.  -- 8/1 VT- paced out -- 8/2 VT - started on sotalol -- 8/6 Hgb 7.5, 1 unit PRBCs -- 8/7 Hgb 9.5, confused.  Head CT with right superior cerebellar artery lacunar infarcts, not acute. -- 8/8 Possible aspiration. Reintubated. -- 8/10 possible seizure => Depakote.  CT head no  change.  1 unit PRBCs.   Remains intubated, awake on vent and responds appropriately.  Now off norepinephrine.  Got IV Lasix x 2 yesterday, CVP down to 4.  BUN rising, up to 90.  Creatinine 2.43 => 2.32 => 2.33 => 2.19 => 2.29.   Afebrile.  WBCs 26.7 => 23.6 => 22 => 23.6 => 25 => 16.   Afebrile.  Continues on vancomycin/meropenem for suspected sepsis, cultures still negative.  With ?drug rash, Depakote stopped and Keppra started, think this looks better.    Heparin gtt with subtherapeutic INR.   He tolerated vent weaning yesterday x 4 hours.    HVAD INTERROGATION:  HVAD:  Flow 3.8 liters/min, speed 2600,  power 3.8 W,  Peak 6.5 Trough 3. No alarms (no further suction events). Suction On. Lavare On.     Objective:    Vital Signs:   Temp:  [97.8 F (36.6 C)-99 F (37.2 C)] 99 F (37.2 C) (08/16 0300) Pulse Rate:  [59-65] 62 (08/16 0630) Resp:  [14-33] 18 (08/16 0630) BP: (84-112)/(63-94) 94/82 (08/16 0630) SpO2:  [94 %-100 %] 100 % (08/16 0630) FiO2 (%):  [30 %] 30 % (08/16 0400) Last BM Date: 06/04/17 Mean arterial Pressure 70s-90s Intake/Output:   Intake/Output Summary (Last 24 hours) at 06/05/17 0708 Last data filed at 06/05/17 0654  Gross per 24 hour  Intake          2841.43 ml  Output  2550 ml  Net           291.43 ml     Physical Exam  CVP 4  Physical Exam: GENERAL: Intubated. Opens eyes.   HEENT: +ETT. Cor-track NECK: Supple, JVP 8 cm. Carotids OK.  CARDIAC:  Mechanical heart sounds with LVAD hum present.  LUNGS:  Bilateral rhonchi.  ABDOMEN:  NT, ND, no HSM. No bruits or masses. +BS  LVAD exit site: Well-healed and incorporated. Dressing dry and intact. No erythema or drainage. Stabilization device present and accurately applied. Driveline dressing changed daily per sterile technique. EXTREMITIES:  Warm and dry. No cyanosis, clubbing, or rash. R and LLE 2+ edema to knees. NEUROLOGIC:  Intubated. Follows commands.  SKIN: Sacrum with unstageable  pressure ulcer. Faint erythematous rash abdomen and thighs.   Telemetry   Personally reviewed, NSR with v pacing at 60.   Labs   Basic Metabolic Panel:  Recent Labs Lab 05/31/17 0322  06/02/17 0321 06/03/17 0421 06/04/17 0518 06/04/17 1600 06/05/17 0434  NA 143  < > 144 143 143 144 146*  K 4.2  < > 4.4 5.3* 6.0* 6.1* 5.9*  CL 113*  < > 114* 113* 112* 114* 113*  CO2 21*  < > 24 24 23 24 26   GLUCOSE 210*  < > 132* 196* 227* 251* 226*  BUN 59*  < > 65* 78* 82* 81* 90*  CREATININE 2.47*  < > 2.43* 2.32* 2.33* 2.19* 2.29*  CALCIUM 7.8*  < > 7.6* 7.9* 7.9* 7.6* 7.8*  MG 2.0  --   --   --   --   --   --   < > = values in this interval not displayed.  Liver Function Tests:  Recent Labs Lab 06/03/17 1233 06/04/17 0518  AST 62* 52*  ALT 26 23  ALKPHOS 193* 174*  BILITOT 0.8 0.8  PROT 4.9* 5.0*  ALBUMIN 1.8* 1.7*   No results for input(s): LIPASE, AMYLASE in the last 168 hours. No results for input(s): AMMONIA in the last 168 hours.  CBC:  Recent Labs Lab 06/01/17 0323 06/02/17 0321 06/03/17 0421 06/04/17 0518 06/05/17 0434  WBC 23.6* 22.2* 23.6* 25.0* 16.1*  NEUTROABS  --  20.0*  --   --   --   HGB 10.0* 9.3* 9.7* 10.2* 9.8*  HCT 30.7* 28.8* 30.6* 31.9* 30.6*  MCV 87.5 87.8 88.2 88.1 89.0  PLT 295 260 270 323 312    INR:  Recent Labs Lab 06/01/17 0323 06/02/17 0321 06/03/17 0421 06/04/17 0518 06/05/17 0434  INR 2.25 2.09 1.73 1.52 1.40    Other results:     Imaging   No results found.   Medications:     Scheduled Medications: . amiodarone  200 mg Per Tube BID  . chlorhexidine gluconate (MEDLINE KIT)  15 mL Mouth Rinse BID  . Chlorhexidine Gluconate Cloth  6 each Topical Q0600  . colchicine  0.6 mg Oral Daily  . collagenase   Topical Daily  . febuxostat  120 mg Oral Daily  . insulin aspart  0-15 Units Subcutaneous Q4H  . levETIRAcetam  500 mg Per Tube BID  . levothyroxine  50 mcg Per NG tube QAC breakfast  . magnesium oxide  400 mg  Per Tube Daily  . mouth rinse  15 mL Mouth Rinse 10 times per day  . mexiletine  300 mg Per Tube Q12H  . pantoprazole sodium  40 mg Per Tube BID  . predniSONE  20 mg Oral Q breakfast  .  sodium chloride flush  10-40 mL Intracatheter Q12H  . sodium polystyrene  30 g Oral Once  . sotalol  80 mg Per Tube Daily    Infusions: . feeding supplement (GLUCERNA 1.2 CAL) 1,000 mL (06/05/17 0500)  . heparin 3,200 Units/hr (06/05/17 0654)  . meropenem (MERREM) IV Stopped (06/04/17 2142)  . norepinephrine (LEVOPHED) Adult infusion Stopped (06/04/17 2250)    PRN Medications: acetaminophen (TYLENOL) oral liquid 160 mg/5 mL, albuterol, docusate, hydrALAZINE, neomycin-bacitracin-polymyxin, ondansetron (ZOFRAN) IV, oxyCODONE, phenol, sodium chloride flush   Patient Profile   56 yo with CAD s/p CABG, ischemic cardiomyopathy/chronic systolic CHF, tophaceous gout, and CKD stage 3 was admitted for diuresis and consideration for LVAD placement. S/p HVAD on 6/28  Assessment/Plan:    1. Acute/chronic systolic CHF s/p HVAD: Ischemic cardiomyopathy.  [employer] Jude ICD.  Echo (6/18) with EF 15%, mildly dilated RV with moderately decreased systolic function.  s/p HVAD placement 6/28 and had to return to OR to evacuate mediastinal hematoma.  He had been weaned off pressors/milrinone, but developed ileus w/ likely aspiration event 7/4, re-intubated, developed afib/RVR requiring norepinephrine. Extubated 7/6.  VAD speed turned down to 2600 with VT. VAD parameters currently look good.  Milrinone stopped 7/17 with recurrent VT.  He was started back on norepinephrine 8/9 due to soft BP after intubation and suspected septic shock.  Now off norepinephrine with stable MAP.  Got Lasix x 2 yesterday, CVP down to 4 with BUN up to 91.  Co-ox 71%.  Suspect 3rd-spacing with low albumin, his central blood volume is not high as evidenced by low CVP and it is going to be hard to mobilize his edema fluid.  - Hold Lasix this morning, can  reassess in afternoon.   - Off ASA with GI Bleed  - Goal INR 2-2.5. Today's INR down to 1.4 => Covering with heparin.  2. AKI on CKD stage 3: Creatinine fairly stable but BUN up with diuresis yesterday. Not clearing potassium. Had big BM last night with Kayexalate.  - Will give another dose of Kayexalate this morning, BMET in pm.  3. Symptomatic anemia due to acute UGI bleeding:  Received 3 units PRBCs 7/16 and 3 units PRBCs on 05/08/2017. 1 unit PRBCs 7/20. 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  Requiring 2 clips + epi + APC. 1 unit PRBCs 8/6.  He had 1 unit PRBCs 8/10 to provide volume.  Hemoglobin stable today.   - Got Feraheme 7/28.  - Continue Protonix 40 po bid. - Off aspirin for now.  - Transfuse hgb < 8 4. Ventricular tachycardia: Status post ventricular tachycardia 2 on 7/13. Possible suction event. VAD speed turned down to 2700.  Recurrent VT 7/17. EP called to bedside. Overdrive pacing successful for about 10 minutes but VT recurred.  Milrinone turned off, remained in VT overnight but hemodynamically stable.  Speed decreased to 2600.  On 7/18, we were able to pace him out of VT.  Lidocaine added then stopped due to confusion/mental status changes.  He had VT 7/22 about 30 minutes after getting IV Lasix, had to be paced out again => may have been due to rapid fluid shift.  Pt paced out of VT 3 separate occasions 7/25. Got amio bolus x 2 and started back on IV infusion.  Transitioned to amiodarone 400 mg tid.  On 8/1 had recurrent VT and paced out again.  Recurrent VT 8/2 per EP sotalol started along with amiodarone, sotalol decreased to once daily  with bradycardia.  VT now quiescent.  - Continue mexiletine to 300 mg BID, sotalol 80 mg daily, amio 200 mg BID. - Will have to watch for VT with IV Lasix.  5. CAD: s/p CABG 2012. No chest pain.  Off aspirin due to GI bleeding. No change.  6. Gout: Severe tophaceous gout. Left shoulder pain ?gout.     -  Continue uloric. No change.  7. Atrial fibrillation: Developed atrial fibrillation with RVR in setting of aspiration PNA on 7/4. ECG this am appears to be NSR.  8. Malnutrition: Continue tube feeds via Cor-track. Nutrition following.   9. Aspiration PNA with acute hypoxemic respiratory failure on 7/4: He was extubated on 7/6. Tracheal aspirate with Klebsiella and citrobacter. Both sensitive to Zosyn. Completed abx 7/13. Possible aspiration 8/8.  - Now on vanco/meropenum.    10. Ileus: Resolved.   11. Deconditioning:  Plan for CIR when stable. OT/PT appreciated. CIR on hold. Reconsult once extubated if tolerates.  12. Acute Respiratory Failure: Intubated 7/17 in setting of complex GI intervention. Extubated 7/18. Re-intubated 8/8.  Weak respiratory muscles. He has been making progress with vent wean, weaned for 4 hours yesterday.  Wean again today => he would not want tracheostomy, will continue wean towards extubation as long as he is making progress.  If he stops making progress, will need to discuss comfort care.  13. Delirium: Resolved. No further. Appreciate neuro input Possibly related to lidocaine.  Lidocaine stopped. CT of head negative. EEG ok. No further workup per neuro. Neuro  reconsulted 8/7 with AMS => ?related to fever/infection.  He has been more clear on vent.   - CT head 05/27/17 and 8/10 with lacunar infarcts but do not appear acute.   - Seizure activity versus myoclonus => Depakote started.  Switched to [place] with ?drug rash from Depakote.   14. Insomnia: On remeron for sleep and depression. No change.  15. Post-op depression: Remeron started 7/28. No change 16. UTI: on vancomycin and meropenum. Urine culture- No growth. .  17. Unstageable Pressure Ulcer: Sacrum. Consult WOC Needs to start santyl daily to enzymatically debride. Reposition R to L. WOC recommendations appreciated.    18. Bradycardia: With hypotension.  Currently back-up pacing set at 60 bpm.  Does not have atrial lead  so pacing asynchronously.  Currently, on and off pacing at rate 60.   19. Hypothyroidism: May be related to amiodarone, he is on Levoxyl. No change.  20. ID: Suspected septic shock, source = pressure ulcer versus UTI.  Afebrile now, WBCs coming down, BP improving.  Continue meropenem/vancomycin. Cultures so far negative.   - Faint rash, ?drug eruption (may be due to meropenem versus depakote).  Depakote switched out for Keppra. 21. Rash: ?Drug eruption.  Has not worsened and is faint.  Meropenem versus Depakote may be the culprits. - Depakote stopped and switched for Keppra per Neuro recommendation, rash seems improved.   I reviewed the HVAD parameters from today, and compared the results to the patient's prior recorded data.  No programming changes were made.  The HVAD is functioning within specified parameters.    CRITICAL CARE Performed by: Loralie Champagne  Total critical care time: 35 minutes  Critical care time was exclusive of separately billable procedures and treating other patients.  Critical care was necessary to treat or prevent imminent or life-threatening deterioration.  Critical care was time spent personally by me on the following activities: development of treatment plan with patient and/or surrogate as well as nursing, discussions  with consultants, evaluation of patient's response to treatment, examination of patient, obtaining history from patient or surrogate, ordering and performing treatments and interventions, ordering and review of laboratory studies, ordering and review of radiographic studies, pulse oximetry and re-evaluation of patient's condition.  Loralie Champagne, MD 06/05/2017, 7:08 AM  VAD Team --- VAD ISSUES ONLY--- Pager [phone] (7am - 7am) Advanced Heart Failure Team  Pager [phone] (M-F; 7a - 4p)  Please contact [facility] for night-coverage after hours (4p -7a ) and weekends on [email or web address]

## 2017-06-05 NOTE — Progress Notes (Signed)
While turning patient suction alarm sounded. No acute changes to patients vitals and VAD flow. VAD coordinator made aware. Will continue to monitor.

## 2017-06-05 NOTE — Progress Notes (Addendum)
Palliative:  [name] is much unchanged. His mother and wife's cousin are at bedside. Support provided to them. [name]'s mother is struggling with why this is happening to her son - offered emotional support and reassurance that he is where he needs to be right now and unfortunately we may likely never know the purpose behind so much struggle and suffering. They report that his wife is at home attending to things she needed to get done - I emphasized the importance of self care and caring for each other in the family going through this difficult time. Therapeutic listening and emotional support provided.   25min  James ChannelAlicia Ygnacio Fecteau, NP Palliative Medicine Team Pager # [phone] (M-F 8a-5p) Team Phone # [phone] (Nights/Weekends)

## 2017-06-05 NOTE — Progress Notes (Signed)
Patient ID: James [name], male   DOB: May 07, 1961, 56 y.o.   MRN: [id number]  SICU Evening Rounds:  He has been hemodynamically stable with stable pump parmeters.  Weaned on vent all day.  BMET    Component Value Date/Time   NA 146 (H) 06/05/2017 1532   K 6.1 (H) 06/05/2017 1532   CL 115 (H) 06/05/2017 1532   CO2 24 06/05/2017 1532   GLUCOSE 207 (H) 06/05/2017 1532   BUN 91 (H) 06/05/2017 1532   CREATININE 2.24 (H) 06/05/2017 1532   CREATININE 1.89 (H) 10/19/2015 1438   CALCIUM 7.9 (L) 06/05/2017 1532   GFRNONAA 31 (L) 06/05/2017 1532   GFRAA 36 (L) 06/05/2017 1532   Potassium remains elevated. Will give another dose of kayexalate. Glucerna tube feeds may be the cause of persistent hyperkalemia with his renal function. Will hold for now and have nutritionist evaluate in am. May need Nepro.

## 2017-06-05 NOTE — Progress Notes (Signed)
CSW met with patient's mother at bedside. Patient continues to be intubated and opened eyes briefly during visit. Mother stated she is supporting patient and "stroking his arm to help comfort him". Patient's wife at home trying to get some things done as reported by mother. CSW provided supportive intervention and continues to follow as needed. Raquel Sarna, [place], [place]

## 2017-06-05 NOTE — Progress Notes (Signed)
ANTICOAGULATION CONSULT NOTE - Follow Up Consult  Pharmacy Consult for Heparin Indication:  HVAD  No Active Allergies  Patient Measurements: Height: 6' (182.9 cm) Weight: 240 lb 4.8 oz (109 kg) IBW/kg (Calculated) : 77.6  Vital Signs: Temp: 97.9 F (36.6 C) (08/16 1145) Temp Source: Axillary (08/16 1145) BP: 105/93 (08/16 0900) Pulse Rate: 59 (08/16 1300)  Labs:  Recent Labs  06/03/17 0421  06/04/17 0518  06/04/17 1600 06/04/17 1750 06/05/17 0434 06/05/17 1316  HGB 9.7*  --  10.2*  --   --   --  9.8*  --   HCT 30.6*  --  31.9*  --   --   --  30.6*  --   PLT 270  --  323  --   --   --  312  --   LABPROT 20.5*  --  18.4*  --   --   --  17.3*  --   INR 1.73  --  1.52  --   --   --  1.40  --   HEPARINUNFRC  --   < >  --   < >  --  <0.10* 0.13* 0.17*  CREATININE 2.32*  --  2.33*  --  2.19*  --  2.29*  --   < > = values in this interval not displayed.  Estimated Creatinine Clearance: 47 mL/min (A) (by C-G formula based on SCr of 2.29 mg/dL (H)).   Assessment: 56 year old male continues on heparin s/p HVAD 6/28 Heparin level remains low at 0.17 on 3200 units/hr. CBC stable overnight. No overt bleeding noted.  Concerns over peripheral IV and arm swelling, changed to central line. May consider UE imaging?  Goal of Therapy:  Heparin level = 0.3-0.5 Monitor platelets by anticoagulation protocol: Yes   Plan:  Increase heparin to 3400 units / hr   Thank you Sheppard CoilFrank Chatham Howington PharmD., BCPS Clinical Pharmacist Pager [phone] 06/05/2017 1:59 PM

## 2017-06-05 NOTE — Progress Notes (Signed)
PULMONARY / CRITICAL CARE MEDICINE   Name: James [name] MRN: [id number] DOB: 1961-07-23    ADMISSION DATE:  04/05/2017 CONSULTATION DATE:  04/23/2017  REFERRING MD:  Laneta Simmers - CVTS  CHIEF COMPLAINT:  Acute respiratory failure  BRIEF SUMMARY:  56 y.o. male with history of systolic congestive heart failure with EF 15%, coronary artery disease, chronic renal failure, diabetes, and essential hypertension admitted on 04/06/2017 for ventricular assist device placement. Patient previously turned down by [facility] for transplant due to limitations in his mobility and his underlying gout. Course complicated by acute on chronic renal failure. He has had recurrent episodes of supraventricular tachycardia and GI bleeding. Patient was reintubated on 8/8 with concern for aspiration secondary to increased work of breathing and displaced NG tube. PCCM was consulted for help.  SUBJECTIVE:  No acute events overnight. Patient tolerated 4 hours of pressure support weaning. Still complaining of joint pain.  REVIEW OF SYSTEMS:  Unable to obtain as patient is intubated.  VITAL SIGNS: BP (!) 85/58   Pulse (!) 59   Temp 99 F (37.2 C) (Oral)   Resp 17   Ht 6' (1.829 m)   Wt 240 lb 4.8 oz (109 kg)   SpO2 100%   BMI 32.59 kg/m   HEMODYNAMICS: CVP:  [3 mmHg-12 mmHg] 3 mmHg  VENTILATOR SETTINGS: Vent Mode: PRVC FiO2 (%):  [30 %] 30 % Set Rate:  [14 bmp] 14 bmp Vt Set:  [620 mL] 620 mL PEEP:  [5 cmH20] 5 cmH20 Plateau Pressure:  [15 cmH20-19 cmH20] 15 cmH20  INTAKE / OUTPUT: I/O last 3 completed shifts: In: 4478.2 [I.V.:1018.2; Other:170; NG/GT:3140; IV Piggyback:150] Out: 2945 [Urine:1795; Stool:1150]  PHYSICAL EXAMINATION: General:  No family at bedside. Comfortable. Respiratory therapist and nurse at bedside. Integument:  No rash on exposed skin. Warm. Dry. HEENT: Endotracheal tube in place. No scleral icterus. Moist mucous membranes. Cardiovascular:  Anasarca persists. Regular  rate. Hum of LVAD noted. Pulmonary:  Distant breath sounds obscured by LVAD. Symmetric chest wall expansion on ventilator. Abdomen: Soft. Nondistended. Normoactive bowel sounds. Musculoskeletal:  Diffuse joint swelling and deformities of the hands. Muscle wasting. Neurological:  Still nodding to questions.  LABS:  BMET  Recent Labs Lab 06/04/17 0518 06/04/17 1600 06/05/17 0434  NA 143 144 146*  K 6.0* 6.1* 5.9*  CL 112* 114* 113*  CO2 23 24 26   BUN 82* 81* 90*  CREATININE 2.33* 2.19* 2.29*  GLUCOSE 227* 251* 226*   Electrolytes  Recent Labs Lab 05/31/17 0322  06/04/17 0518 06/04/17 1600 06/05/17 0434  CALCIUM 7.8*  < > 7.9* 7.6* 7.8*  MG 2.0  --   --   --   --   < > = values in this interval not displayed. CBC  Recent Labs Lab 06/03/17 0421 06/04/17 0518 06/05/17 0434  WBC 23.6* 25.0* 16.1*  HGB 9.7* 10.2* 9.8*  HCT 30.6* 31.9* 30.6*  PLT 270 323 312   Coag's  Recent Labs Lab 06/03/17 0421 06/04/17 0518 06/05/17 0434  INR 1.73 1.52 1.40   Sepsis Markers  Recent Labs Lab 05/30/17 0310  PROCALCITON 1.33   ABG No results for input(s): PHART, PCO2ART, PO2ART in the last 168 hours. Liver Enzymes  Recent Labs Lab 06/03/17 1233 06/04/17 0518  AST 62* 52*  ALT 26 23  ALKPHOS 193* 174*  BILITOT 0.8 0.8  ALBUMIN 1.8* 1.7*    Cardiac Enzymes No results for input(s): TROPONINI, PROBNP in the last 168 hours.  Glucose  Recent  Labs Lab 06/04/17 0812 06/04/17 1203 06/04/17 1708 06/04/17 1944 06/05/17 0002 06/05/17 0349  GLUCAP 171* 198* 223* 235* 226* 212*    Imaging No results found.   IMAGING/STUDIES: CT HEAD W/O 8/10: IMPRESSION: 1. No acute intracranial process. 2. 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 PORT CXR 8/11:  Previously reviewed by me. No focal opacity. Right upper extremity central venous catheter in place. Endotracheal tube in good  positioning. Enteric feeding tube coursing below diaphragm. PORT CXR 8/14:  Previously reviewed by me. LVAD in place. No new focal opacity. No pleural effusion appreciated. Endotracheal tube and central venous catheter in good position. Enteric feeding tube coursing below diaphragm. Persistent silhouetting of left hemidiaphragm and lower lung opacity.  MICROBIOLOGY: MRSA PCR 6/22:  Positive Blood Cultures x2 7/5:  Negative  Tracheal Aspirate Culture 7/5:  Citrobacter koseri & Klebsiella pneumoniae  Tracheal Aspirate Culture 7/7:  Klebsiella pneumoniae MRSA PCR 7/26:  Negative  Urine Culture 8/7:  Multiple Species  Blood Cultures x3 8/7:  Negative  Tracheal Aspirate Culture 8/8:  Multiple Organisms Present Urine Culture 8/9:  Negative Blood Cultures x2 8/11 >>> Tracheal Aspirate Culture 8/11:  Negative  Blood Cultures x2 8/12 >>>  ANTIBIOTICS: Zinacef 6/28 (periop) Zosyn 7/5 - 7/13 Vancomycin 7/5 - 7/7; 8/8 >>> Merrem 8/8 >>>  SIGNIFICANT EVENTS: 06/22 - Admit  06/28 - Implanting of LVAD 07/05 - Respiratory arrest post feculent vomiting episode 07/06 - Extubated 07/17 - Intubated during EGD 07/18 - Extubated 08/08 - Respiratory distress & intubated. Lasix x2 doses given. 08/13 - Worsened renal function w/ Lasix w/o significant diuresis. Weaned on PS some.  08/14 - Lasix given by Cardiology  LINES/TUBES: OETT 6/28 - 6/29 ; 7/5 - 7/6; 7/17-7/18; 8/8 >>> RUE TL PICC 7/2 >>> R NGT >>> FOLEY >>> RECTAL TUBE >>>  ASSESSMENT / PLAN:    PULMONARY A: Acute, Recurrent Hypoxic Respiratory Failure: Patient and family would not want tracheostomy. Aspiration Pneumonia:  Secondary to tube feedings.  P:   Daily pressure support wean Full ventilator support for rest One-way extubation when family is ready or 2 week mark from last intubation (8/8)  CARDIOVASCULAR A:  Acute on chronic systolic congestive heart failure: Turned down for heart transplant. Status post LVAD. Atrial  fibrillation Recurrent ventricular tachycardia Mediastinal hematoma: Status post evacuation post-LVAD. H/O essential hypertension  P:  Continuing to monitor vitals per unit protocol Monitoring patient with continuous telemetry Management per cardiology & cardiothoracic surgery  RENAL A:   Acute on chronic renal failure stage III: Stable. Hyperkalemia: Relatively unchanged. Hypernatremia: Mild.  P:   Kayexalate 30 g via tube administered again today Further diuresis as per primary service Continuing Foley catheter to monitor urine output Continuing to trend renal function and electrolytes daily  GASTROINTESTINAL A:   Severe protein-calorie malnutrition Chronic mesenteric ischemia  P:   Nothing by mouth  HEMATOLOGIC A:   Anemia: Hemoglobin stable. No evidence of active bleeding. Multifactorial with contribution from mediastinal hematoma. Leukocytosis: Improving significantly. Coagulopathy: Secondary to systemic and coagulation.   P:  Continuing to trend cell counts daily Trending INR daily Transfusion goal per primary service  INFECTIOUS A:   Aspiration pneumonia FUO: Cultures repeatedly negative.  P:   Continuing empiric antibiotics as above Awaiting finalization of culture results Plan to repeat for any fever  ENDOCRINE A:   Diabetes Mellitus Type 2:  Glucose uncontrolled. Hypothyroidism    P:   Continuing Synthroid via tube daily  Starting Lantus 15 units subcutaneous daily Continuing Accu-Cheks every 4 hours Continuing sliding scale insulin per moderate algorithm  NEUROLOGIC/MUSCULOSKELETAL A:   Sedation on ventilator Gout  P:   Desired RASS:  0 to -1 AED:  Keppra via tube BID. Continuing Colchicine (started 8/13) Continuing Prednisone 20mg  daily  Fentanyl IV prn pain Continuing Oxycodone prn pain  Prophylaxis: Protonix via tube twice a day & heparin drip per pharmacy protocol. Diet:  NPO.  Continuing tube feedings per dietary  recommendations. Code Status:  Partial Code as per previous physician discussions. Disposition:  Remains critically ill in the ICU. Plan for one-way extubation when family ready for at 2 week mark. Family Update:  No family at bedside during rounds this morning.  DISCUSSION:  56 y.o. male status post LVAD. Continuing daily pressure support wean. Planning for one-way extubation.   Remainder of care as per primary service and other consultants.  I have spent a total of 31 minutes of critical care time today caring for the patient and reviewing the patient's electronic medical record.   Donna Christen Jamison Neighbor, M.D. [facility] Pager:  [phone] After 3pm or if no response, call [phone] 06/05/2017, 8:33 AM

## 2017-06-06 LAB — GLUCOSE, CAPILLARY
GLUCOSE-CAPILLARY: 84 mg/dL (ref 65–99)
GLUCOSE-CAPILLARY: 151 mg/dL — AB (ref 65–99)
Glucose-Capillary: 129 mg/dL — ABNORMAL HIGH (ref 65–99)
Glucose-Capillary: 166 mg/dL — ABNORMAL HIGH (ref 65–99)
Glucose-Capillary: 182 mg/dL — ABNORMAL HIGH (ref 65–99)
Glucose-Capillary: 188 mg/dL — ABNORMAL HIGH (ref 65–99)
Glucose-Capillary: 76 mg/dL (ref 65–99)

## 2017-06-06 LAB — CBC
HEMATOCRIT: 28.9 % — AB (ref 39.0–52.0)
Hemoglobin: 9.2 g/dL — ABNORMAL LOW (ref 13.0–17.0)
MCH: 28.6 pg (ref 26.0–34.0)
MCHC: 31.8 g/dL (ref 30.0–36.0)
MCV: 89.8 fL (ref 78.0–100.0)
Platelets: 291 10*3/uL (ref 150–400)
RBC: 3.22 MIL/uL — ABNORMAL LOW (ref 4.22–5.81)
RDW: 18.2 % — AB (ref 11.5–15.5)
WBC: 13.5 10*3/uL — ABNORMAL HIGH (ref 4.0–10.5)

## 2017-06-06 LAB — BASIC METABOLIC PANEL
ANION GAP: 5 (ref 5–15)
Anion gap: 9 (ref 5–15)
BUN: 88 mg/dL — ABNORMAL HIGH (ref 6–20)
BUN: 83 mg/dL — ABNORMAL HIGH (ref 6–20)
CALCIUM: 8 mg/dL — AB (ref 8.9–10.3)
CHLORIDE: 119 mmol/L — AB (ref 101–111)
CO2: 24 mmol/L (ref 22–32)
CO2: 26 mmol/L (ref 22–32)
CREATININE: 2.06 mg/dL — AB (ref 0.61–1.24)
Calcium: 8.1 mg/dL — ABNORMAL LOW (ref 8.9–10.3)
Chloride: 116 mmol/L — ABNORMAL HIGH (ref 101–111)
Creatinine, Ser: 1.95 mg/dL — ABNORMAL HIGH (ref 0.61–1.24)
GFR calc Af Amer: 40 mL/min — ABNORMAL LOW (ref >60)
GFR calc non Af Amer: 35 mL/min — ABNORMAL LOW (ref >60)
GFR calc non Af Amer: 37 mL/min — ABNORMAL LOW (ref >60)
GFR, EST AFRICAN AMERICAN: 43 mL/min — AB (ref >60)
GLUCOSE: 141 mg/dL — AB (ref 65–99)
Glucose, Bld: 106 mg/dL — ABNORMAL HIGH (ref 65–99)
POTASSIUM: 5.3 mmol/L — AB (ref 3.5–5.1)
Potassium: 5.5 mmol/L — ABNORMAL HIGH (ref 3.5–5.1)
SODIUM: 150 mmol/L — AB (ref 135–145)
Sodium: 149 mmol/L — ABNORMAL HIGH (ref 135–145)

## 2017-06-06 LAB — COOXEMETRY PANEL
Carboxyhemoglobin: 1.5 % (ref 0.5–1.5)
Methemoglobin: 0.8 % (ref 0.0–1.5)
O2 Saturation: 69.5 %
TOTAL HEMOGLOBIN: 12.2 g/dL (ref 12.0–16.0)

## 2017-06-06 LAB — CULTURE, BLOOD (ROUTINE X 2)
CULTURE: NO GROWTH
CULTURE: NO GROWTH
Special Requests: ADEQUATE
Special Requests: ADEQUATE

## 2017-06-06 LAB — HEPARIN LEVEL (UNFRACTIONATED)
HEPARIN UNFRACTIONATED: 0.51 [IU]/mL (ref 0.30–0.70)
Heparin Unfractionated: 1.34 IU/mL — ABNORMAL HIGH (ref 0.30–0.70)
Heparin Unfractionated: 1.01 IU/mL — ABNORMAL HIGH (ref 0.30–0.70)
Heparin Unfractionated: 1.04 IU/mL — ABNORMAL HIGH (ref 0.30–0.70)
Heparin Unfractionated: 0.56 IU/mL (ref 0.30–0.70)

## 2017-06-06 LAB — PROTIME-INR
INR: 1.57
Prothrombin Time: 19 seconds — ABNORMAL HIGH (ref 11.4–15.2)

## 2017-06-06 LAB — VANCOMYCIN, TROUGH: VANCOMYCIN TR: 15 ug/mL (ref 15–20)

## 2017-06-06 LAB — LACTATE DEHYDROGENASE: LDH: 138 U/L (ref 98–192)

## 2017-06-06 LAB — MAGNESIUM: Magnesium: 1.9 mg/dL (ref 1.7–2.4)

## 2017-06-06 MED ORDER — ALBUMIN HUMAN 5 % IV SOLN
12.5000 g | Freq: Once | INTRAVENOUS | Status: AC
  Administered 2017-06-06: 12.5 g via INTRAVENOUS
  Filled 2017-06-06: qty 250

## 2017-06-06 MED ORDER — SODIUM POLYSTYRENE SULFONATE 15 GM/60ML PO SUSP
30.0000 g | Freq: Once | ORAL | Status: AC
  Administered 2017-06-06: 30 g
  Filled 2017-06-06: qty 120

## 2017-06-06 MED ORDER — HEPARIN (PORCINE) IN NACL 100-0.45 UNIT/ML-% IJ SOLN: 2600.0000 [IU]/h | INTRAMUSCULAR | Status: DC

## 2017-06-06 MED ORDER — SODIUM CHLORIDE 0.9 % IV BOLUS (SEPSIS)
500.0000 mL | Freq: Once | INTRAVENOUS | Status: AC
Start: 2017-06-06 — End: 2017-06-06
  Administered 2017-06-06: 500 mL via INTRAVENOUS

## 2017-06-06 MED ORDER — NEPRO/CARBSTEADY PO LIQD
1000.0000 mL | ORAL | Status: DC
  Administered 2017-06-06 – 2017-06-11 (×6): 1000 mL
  Filled 2017-06-06 (×7): qty 1000

## 2017-06-06 MED ORDER — HEPARIN (PORCINE) IN NACL 100-0.45 UNIT/ML-% IJ SOLN
1700.0000 [IU]/h | INTRAMUSCULAR | Status: DC
  Administered 2017-06-06: 2000 [IU]/h via INTRAVENOUS
  Administered 2017-06-07: 1700 [IU]/h via INTRAVENOUS
  Filled 2017-06-06 (×2): qty 250

## 2017-06-06 MED ORDER — SODIUM CHLORIDE 0.9 % IV BOLUS (SEPSIS): 500.0000 mL | Freq: Once | INTRAVENOUS | Status: DC

## 2017-06-06 MED ORDER — HEPARIN (PORCINE) IN NACL 100-0.45 UNIT/ML-% IJ SOLN: 2200.0000 [IU]/h | INTRAMUSCULAR | Status: DC

## 2017-06-06 MED ORDER — VANCOMYCIN HCL IN DEXTROSE 750-5 MG/150ML-% IV SOLN
750.0000 mg | Freq: Once | INTRAVENOUS | Status: AC
  Administered 2017-06-06: 750 mg via INTRAVENOUS
  Filled 2017-06-06: qty 150

## 2017-06-06 MED ORDER — MAGNESIUM SULFATE 2 GM/50ML IV SOLN
2.0000 g | Freq: Once | INTRAVENOUS | Status: AC
  Administered 2017-06-06: 2 g via INTRAVENOUS
  Filled 2017-06-06: qty 50

## 2017-06-06 MED ORDER — PRO-STAT SUGAR FREE PO LIQD
60.0000 mL | Freq: Two times a day (BID) | ORAL | Status: DC
  Administered 2017-06-06 – 2017-06-25 (×39): 60 mL
  Filled 2017-06-06 (×38): qty 60

## 2017-06-06 NOTE — Progress Notes (Signed)
Patient ID: James [name], male   DOB: 1961-05-11, 56 y.o.   MRN: [id number]   Advanced Heart Failure VAD Team Note  Subjective:    Events: --HVAD placed 6/28.  Returned to the OR that evening with high chest tube output, evacuation of mediastinal hematoma.  --Extubated 6/29. Milrinone stopped on 7/4. --7/4, patient developed ileus with respiratory compromise. NGT placed with 3 L suctioned out.  CXR with suspicion for aspiration PNA.  Patient had to be intubated.  He went into atrial fibrillation with RVR.  He became hypotensive and was started on norepinephrine and phenylephrine.  Amiodarone gtt begun.    --Extubated again on 7/6.  -- 7/13 Had 2 sustained episodes of VT. Had to be cardioverted 1. Second episode broke with overdrive pacing with Dr. Caryl Comes. VAD speed turned down to 2700.  --Melena due to acuteGI bleed --> 2 units PRBCs 7/14, 2 units 7/15. 3 U PRBCs 7/16,  05/13/2017 3 UPRBCs.  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  He had epinephrine, APC, and 2 clips. Intubated prior to procedure and placed on norepinephrine. Speed dropped to 2660.  --7/18 Back in VT with overdrive pacing. Lidocaine was started in addition to amiodarone. Extubated. --7/19 lidocaine stopped with confusion. Neuro consulted.  EEG normal.  CT of head no acute findings. 7/20 confusion had resolved.  --1 unit PRBCs 7/20.  --7/22, developed sustained VT with rate around 130 shortly after getting IV Lasix.  We were able to pace him out and back to NSR.  He was put back on amiodarone gtt.  --More VT on 7/25, paced out.  Back on IV amiodarone gtt.  NSR.  -- 7/31 Ramp ECHO decrease speed 2600 rpm.  -- 8/1 VT- paced out -- 8/2 VT - started on sotalol -- 8/6 Hgb 7.5, 1 unit PRBCs -- 8/7 Hgb 9.5, confused.  Head CT with right superior cerebellar artery lacunar infarcts, not acute. -- 8/8 Possible aspiration. Reintubated. -- 8/10 possible seizure => Depakote.  CT head no  change.  1 unit PRBCs.   Remains intubated and awake on vent. Responding appropriately.   CVP 4-6. Had 9 suction alarms overnight. Given 1 L IVF and albumin overnight. Creatinine 2.06. BUN 88.  WBCs 26.7 => 23.6 => 22 => 23.6 => 25 => 16 => 13.5.  Afebrile. Continues on vancomycin/meropenem for suspected sepsis, cultures still negative.  With ?drug rash, Depakote stopped and Keppra started. Rash has improved.     Heparin gtt with subtherapeutic INR. Heparin level now high with infiltration. Pharmacy working with Nurse to bring back down.   HVAD INTERROGATION:  HVAD:  Flow 3.7 liters/min, speed 2600,  power 4.0 W,  Peak 6.7 Trough 1.3. 9 suction alarms overnight. Suction On. Lavare On.    Auto-log sent to Rep and pending results.   Objective:    Vital Signs:   Temp:  [97.7 F (36.5 C)-98.7 F (37.1 C)] 97.9 F (36.6 C) (08/17 0300) Pulse Rate:  [59-62] 60 (08/17 0700) Resp:  [12-24] 14 (08/17 0700) BP: (81-122)/(63-93) 110/70 (08/17 0600) SpO2:  [99 %-100 %] 100 % (08/17 0700) FiO2 (%):  [30 %] 30 % (08/17 0400) Weight:  [245 lb 9.5 oz (111.4 kg)] 245 lb 9.5 oz (111.4 kg) (08/17 0500) Last BM Date: 06/05/17 (via rectal tube) Mean arterial Pressure 84 Intake/Output:   Intake/Output Summary (Last 24 hours) at 06/06/17 0800 Last data filed at 06/06/17 0700  Gross per 24 hour  Intake  2586.5 ml  Output             2265 ml  Net            321.5 ml     Physical Exam  CVP 4-5 cm  Physical Exam: GENERAL: Intubated. Opens eyes.   HEENT: +ETT. Cor-track. NECK: Supple, JVP not elevated. Carotids OK.  CARDIAC:  Mechanical heart sounds with LVAD hum present.  LUNGS:  Bilateral rhonchi. + Mechanical heart sounds.   ABDOMEN:  NT, ND, no HSM. No bruits or masses. +BS  LVAD exit site: Dressing dry and intact. No erythema or drainage. Stabilization device present and accurately applied. Driveline dressing changed daily per sterile technique. EXTREMITIES:  Warm and dry. No  cyanosis, clubbing, or rash. 2+ peripheral edema. Chronic gouty changes bilateral hands.  NEUROLOGIC:  Intubated. Follows commands.  SKIN: Sacrum with unstageable pressure ulcer. Faint erythematous rash abdomen and thighs.   Telemetry   Personally reviewed, NSR with V pacing at 60    Labs   Basic Metabolic Panel:  Recent Labs Lab 05/31/17 0322  06/04/17 0518 06/04/17 1600 06/05/17 0434 06/05/17 1532 06/06/17 0219  NA 143  < > 143 144 146* 146* 149*  K 4.2  < > 6.0* 6.1* 5.9* 6.1* 5.5*  CL 113*  < > 112* 114* 113* 115* 116*  CO2 21*  < > 23 24 26 24 24   GLUCOSE 210*  < > 227* 251* 226* 207* 141*  BUN 59*  < > 82* 81* 90* 91* 88*  CREATININE 2.47*  < > 2.33* 2.19* 2.29* 2.24* 2.06*  CALCIUM 7.8*  < > 7.9* 7.6* 7.8* 7.9* 8.0*  MG 2.0  --   --   --   --   --   --   < > = values in this interval not displayed.  Liver Function Tests:  Recent Labs Lab 06/03/17 1233 06/04/17 0518  AST 62* 52*  ALT 26 23  ALKPHOS 193* 174*  BILITOT 0.8 0.8  PROT 4.9* 5.0*  ALBUMIN 1.8* 1.7*   No results for input(s): LIPASE, AMYLASE in the last 168 hours. No results for input(s): AMMONIA in the last 168 hours.  CBC:  Recent Labs Lab 06/02/17 0321 06/03/17 0421 06/04/17 0518 06/05/17 0434 06/06/17 0219  WBC 22.2* 23.6* 25.0* 16.1* 13.5*  NEUTROABS 20.0*  --   --   --   --   HGB 9.3* 9.7* 10.2* 9.8* 9.2*  HCT 28.8* 30.6* 31.9* 30.6* 28.9*  MCV 87.8 88.2 88.1 89.0 89.8  PLT 260 270 323 312 291    INR:  Recent Labs Lab 06/02/17 0321 06/03/17 0421 06/04/17 0518 06/05/17 0434 06/06/17 0219  INR 2.09 1.73 1.52 1.40 1.57    Other results:     Imaging   No results found.   Medications:     Scheduled Medications: . amiodarone  200 mg Per Tube BID  . chlorhexidine gluconate (MEDLINE KIT)  15 mL Mouth Rinse BID  . Chlorhexidine Gluconate Cloth  6 each Topical Q0600  . colchicine  0.6 mg Oral Daily  . collagenase   Topical Daily  . febuxostat  120 mg Oral Daily   . insulin aspart  0-15 Units Subcutaneous Q4H  . insulin glargine  15 Units Subcutaneous Daily  . levETIRAcetam  500 mg Per Tube BID  . levothyroxine  50 mcg Per NG tube QAC breakfast  . magnesium oxide  400 mg Per Tube Daily  . mouth rinse  15 mL Mouth Rinse  10 times per day  . mexiletine  300 mg Per Tube Q12H  . pantoprazole sodium  40 mg Per Tube BID  . predniSONE  20 mg Per Tube Q breakfast  . sodium chloride flush  10-40 mL Intracatheter Q12H  . sotalol  80 mg Per Tube Daily    Infusions: . heparin 2,200 Units/hr (06/06/17 0700)  . meropenem (MERREM) IV Stopped (06/05/17 2251)  . norepinephrine (LEVOPHED) Adult infusion Stopped (06/04/17 2250)  . sodium chloride Stopped (06/06/17 0335)    PRN Medications: acetaminophen (TYLENOL) oral liquid 160 mg/5 mL, albuterol, docusate, fentaNYL (SUBLIMAZE) injection, hydrALAZINE, neomycin-bacitracin-polymyxin, ondansetron (ZOFRAN) IV, oxyCODONE, sodium chloride flush   Patient Profile   56 yo with CAD s/p CABG, ischemic cardiomyopathy/chronic systolic CHF, tophaceous gout, and CKD stage 3 was admitted for diuresis and consideration for LVAD placement. S/p HVAD on 6/28  Assessment/Plan:    1. Acute/chronic systolic CHF s/p HVAD: Ischemic cardiomyopathy.  St Jude ICD.  Echo (6/18) with EF 15%, mildly dilated RV with moderately decreased systolic function.  s/p HVAD placement 6/28 and had to return to OR to evacuate mediastinal hematoma.  He had been weaned off pressors/milrinone, but developed ileus w/ likely aspiration event 7/4, re-intubated, developed afib/RVR requiring norepinephrine. Extubated 7/6.  VAD speed turned down to 2600 with VT. VAD parameters currently look good.  Milrinone stopped 7/17 with recurrent VT.  He was started back on norepinephrine 8/9 due to soft BP after intubation and suspected septic shock.  - Now off norephineprine. Doppler 84. Continue prn hydral.  - No lasix with low CVP and suction events. Co-ox 69.5%.  Suspect large amount of 3rd-spacing with low albumin. Central blood volume is low.  - Off ASA with GI Bleed  - Goal INR 2-2.5. Today's INR 1.57. Covered with heparin.   2. AKI on CKD stage 3:  - Creatinine improving with IVF.  - Not clearing potassium.   - Recheck BMET this pm. May need repeat Kayexelate. 3. Symptomatic anemia due to acute UGI bleeding:  Received 3 units PRBCs 7/16 and 3 units PRBCs on 05/12/2017. 1 unit PRBCs 7/20. 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  Requiring 2 clips + epi + APC. 1 unit PRBCs 8/6.  He had 1 unit PRBCs 8/10 to provide volume.  Hemoglobin stable today.    - Got Feraheme 7/28.  - Continue Protonix 40 po bid. - Off aspirin for now.  - Transfuse hgb < 8. Hgb 9.2. 4. Ventricular tachycardia: Status post ventricular tachycardia 2 on 7/13. Possible suction event. VAD speed turned down to 2700.  Recurrent VT 7/17. EP called to bedside. Overdrive pacing successful for about 10 minutes but VT recurred.  Milrinone turned off, remained in VT overnight but hemodynamically stable.  Speed decreased to 2600.  On 7/18, we were able to pace him out of VT.  Lidocaine added then stopped due to confusion/mental status changes.  He had VT 7/22 about 30 minutes after getting IV Lasix, had to be paced out again => may have been due to rapid fluid shift.  Pt paced out of VT 3 separate occasions 7/25. Got amio bolus x 2 and started back on IV infusion.  Transitioned to amiodarone 400 mg tid.  On 8/1 had recurrent VT and paced out again.  Recurrent VT 8/2 per EP sotalol started along with amiodarone, sotalol decreased to once daily with bradycardia.  - Continue mexiletine to 300 mg BID, sotalol 80 mg daily, amio 200 mg  BID. - Remains quiescent despite multiple suction events. Continue to follow closely.  5. Paroxysmal Atrial fibrillation: Developed atrial fibrillation with RVR in setting of aspiration PNA on 7/4. - EKG appears to be NSR this am.   6.  Malnutrition: Continue tube feeds via Cor-track. Nutrition following. Tube feed stopped for now with elevated K.  7. Aspiration PNA with acute hypoxemic respiratory failure on 7/4: He was extubated on 7/6. Tracheal aspirate with Klebsiella and citrobacter. Both sensitive to Zosyn. Completed abx 7/13. Possible aspiration 8/8.  - Now on vanco/meropenum.    8. Acute Respiratory Failure: Intubated 7/17 in setting of complex GI intervention. Extubated 7/18. Re-intubated 8/8.  Weak respiratory muscles. He has been making progress with vent wean, weaned for 4 hours yesterday.  Wean again today => he would not want tracheostomy, will continue wean towards extubation as long as he is making progress.  If he stops making progress, will need to discuss comfort care.  9. CAD: s/p CABG 2012. No chest pain.  Off aspirin due to GI bleeding. No change.   10. Gout: Severe tophaceous gout.  - Continue uloric. No change.  11. Delirium: Resolved. No further, though assessment limited back on Vent.  Appreciate neuro input Possibly related to lidocaine.  Lidocaine stopped. CT of head negative. EEG ok. No further workup per neuro. Neuro  reconsulted 8/7 with AMS => ?related to fever/infection.  He has been more clear on vent.   - CT head 05/27/17 and 8/10 with lacunar infarcts but do not appear acute.   - Seizure activity versus myoclonus => Depakote started.  Switched to [place] with ?drug rash from Depakote.   12. UTI: on vancomycin and meropenum. Urine culture- No growth. .  13. Unstageable Pressure Ulcer: Sacrum. Consult WOC Needs to start santyl daily to enzymatically debride. Reposition R to L. WOC recommendations appreciated.     14. Bradycardia: With hypotension.  Currently back-up pacing set at 60 bpm.  Does not have atrial lead so pacing asynchronously.  Currently, on and off pacing at rate of 60.    15. Hypothyroidism: May be related to amiodarone, he is on Levoxyl. No change.   16. ID: Suspected septic shock, source  = pressure ulcer versus UTI.  Afebrile now, WBCs coming down, BP improving.  Continue meropenem/vancomycin. Cultures so far negative.   - Faint rash, ?drug eruption (may be due to meropenem versus depakote).  Depakote switched out for Keppra. Has improved with switch to Keppra.  17. Rash: ?Drug eruption.  Has not worsened and is faint.  Meropenem versus Depakote may be the culprits. - Depakote stopped and switched for Keppra per Neuro recommendation. Rash improved. 18. Deconditioning:  Plan for CIR when stable. OT/PT appreciated. CIR on hold. Reconsult once extubated if tolerates. No change.     I reviewed the HVAD parameters from today, and compared the results to the patient's prior recorded data.  No programming changes were made.  The HVAD is functioning within specified parameters.    Shirley Friar, PA-C 06/06/2017, 8:00 AM  VAD Team --- VAD ISSUES ONLY--- Pager [phone] (7am - 7am) Advanced Heart Failure Team  Pager [phone] (M-F; 7a - 4p)  Please contact [facility] for night-coverage after hours (4p -7a ) and weekends on [email or web address]  Agree with above.   He remains critically ill. Weaned for 4 hours from vent yesterday but remains intubated. Very weak. Flows on VAD have been low. Trough 1.8. Now getting albumin. However weight up 25 pounds from baseline  due to severe 3rd spacing. LE wound appears worse.   On exam Very weak. Awake on vent. Cachetic Cor LVAD hum Lungs: coarse Ab soft distended Ext: + anasarca. + leg wounds  He remains critically ill with worsening 3rd spacing. Now with suction alarms on VAD and low trough. D/w VAD coordinators and industry. Continue this bottle of albumin but would try to avoid giving more. Remains very weak. Will continue attempts at vent wean. CCM plans to watch over weekend and try to extubate next week. I suspect he does not have enough reserve to survive this. D/w wife at bedside.   Continue abx, heparin.  LVAD speed turned down to  2540  CRITICAL CARE Performed by: Glori Bickers  Total critical care time: 45 minutes  Critical care time was exclusive of separately billable procedures and treating other patients.  Critical care was necessary to treat or prevent imminent or life-threatening deterioration.  Critical care was time spent personally by me (independent of midlevel providers or residents) on the following activities: development of treatment plan with patient and/or surrogate as well as nursing, discussions with consultants, evaluation of patient's response to treatment, examination of patient, obtaining history from patient or surrogate, ordering and performing treatments and interventions, ordering and review of laboratory studies, ordering and review of radiographic studies, pulse oximetry and re-evaluation of patient's condition.  Glori Bickers, MD  9:41 AM

## 2017-06-06 NOTE — Progress Notes (Signed)
LVAD Inpatient Coordinator Rounding Note:  Admitted 03/21/2017 due to A/C Heart failure.   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  Transferred to 4E 05/17/17.  Transferred back to ICU 2H09 on 05/28/17 with possible aspiration pneumonia requiring re-intubation.  Pt is currently on SBT tolerating well this morning.   Vital signs: Temp: 97.5 HR: 60 Doppler: 84 Auto BP: 122/80 (93) O2 Sat: 100% on 30% FIO2 and 5 peep Wt in lbs: Marland Kitchen.. 230>229>226>224>235>221>217>223>224>224>234>240>245  LVAD interrogation reveals:  Speed: 2600 Flow: 3.9 Power: 3.7  w Alarms: suction x 11 Peak:  6.7 Trough:  1.3 HCT: 29 Low flow alarm setting: 2 High watt alarm setting: 6  Suction: on  Lavare cycle: on  Blood Products: 6/28> 5 PRBC's, 6 FFP 7/1> 1 PRBC 7/9> 1 PRBC 7/13>3 PRBC 7/14>3 PRBC 7/15> 1 PRBC 7/16>3PRBC 7/17> 3 PRBC 7/20> 1 PRBC 8/6> 1 PRBC 8/10 > 1 PRBC  Gtts: Milrinone restarted 7/8, stopped 7/16 Levo stopped 04/26/17 restarted 04/24/2017 - stopped 05/07/17 Amiodarone stopped 04/29/17, restarted 7/17 - transitioned to PO 05/13/17; VT on 05/15/17 - IV amio re-started - 30 mg/hr stopped 05/16/17 Heparin 2200 u/hr Lidocaine 05/07/17 - stopped 7/19  TPN - started 04/24/17 for nutritional support, stopped 04/30/17  NG inserted with tube feeding started 05/27/17; placed on hold 05/28/17   Cortrak inserted 06/07/17 - started TF 05/1917   Neuro: 05/27/17- Neurology consult for lethargy, mouth tremors, not vocal, not following commands> Head CT and EEG 05/28/17 - Continual EEG monitoring  05/30/17 - started Depakote for seizure activity - stopped 06/03/17 for rash - will start Keppra   Arrhythmia: 04/24/17 - Afib with RVR - started amiodarone 05/02/17 - 2 sustained episodes of VT - cardioverted x 1; second broke with overdrive pacing 7/17- wide complex tachycardia- Amio bolus x3 and gtt at 60 mg/hr 05/07/17 - sustained VT - converted with overdrive pacing;  Lidocaine started in addition to amiodarone 05/08/17 - Lidocaine stopped due to confusion (lido level 12.4) 05/14/17 - sustained VT - converted 3 separate occassions with overdrive pacing 05/19/17 - sustained VT-converted with overdrive pacing  05/26/17- Afib rate in the 50's 05/27/17 - NSR   Respiratory: 04/23/17 - re-intubated due to respiratory failure secondary to suspected aspiration pneumonia 04/25/17-extubated 04/25/2017- intubated for EGD 04/28/2017- extubated 05/28/17 - re-intubated for suspected aspiration pneumonia  Drive Line: Daily Dressing Kits with Aquacell AG silver strips per protocol - will advance to every other day dressing changes.  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 He had epinephrine, APC,    and 2 clips.  - 7/19 - Lidocaine gtt stopped due to confusion (lido toxicity) -8/7- Neurology consult- lethargy, mouth tremors, not vocal, not following commands> Head CT and EEG, blood and urine    cultures sent, Ammonia-20 8/8 - re-intubated for possible aspiration pneumonia   Plan/Recommendations:  1. Currently every other day dressing changes;  Wife perficient in performing dressing changes.  2.  Increased suction events over last 24 hours with trough < 2.0. Event log sent to HVAD clinical rep; recommendations reviewed with Dr. Gala Romney. HVAD speed decreased to 2540 RPM. 3. Please  call VAD  pager with patient and equipment concerns.   Hessie Diener RN, VAD Coordinator 24/7 pager [phone]

## 2017-06-06 NOTE — Progress Notes (Signed)
ANTICOAGULATION CONSULT NOTE - Follow Up Consult  Pharmacy Consult for Heparin  Indication: HVAD  No Active Allergies  Patient Measurements: Height: 6' (182.9 cm) Weight: 245 lb 9.5 oz (111.4 kg) IBW/kg (Calculated) : 77.6  Vital Signs: Temp: 98.5 F (36.9 C) (08/17 2000) Temp Source: Oral (08/17 2000) BP: 123/85 (08/17 2200) Pulse Rate: 61 (08/17 2200)  Labs:  Recent Labs  06/04/17 0518  06/05/17 0434  06/05/17 1532  06/06/17 0219 06/06/17 1230 06/06/17 1405 06/06/17 2152  HGB 10.2*  --  9.8*  --   --   --  9.2*  --   --   --   HCT 31.9*  --  30.6*  --   --   --  28.9*  --   --   --   PLT 323  --  312  --   --   --  291  --   --   --   LABPROT 18.4*  --  17.3*  --   --   --  19.0*  --   --   --   INR 1.52  --  1.40  --   --   --  1.57  --   --   --   HEPARINUNFRC  --   < > 0.13*  < > 0.51  < > 1.01* 1.04* 0.51 0.56  CREATININE 2.33*  < > 2.29*  --  2.24*  --  2.06* 1.95*  --   --   < > = values in this interval not displayed.  Estimated Creatinine Clearance: 55.8 mL/min (A) (by C-G formula based on SCr of 1.95 mg/dL (H)).  Assessment: 56 y/o M continues on heparin s/p HVAD on 04/14/2017. Concerns were raised about PIV not infusing correctly so heparin was switched to central line with peripheral blood draws. Heparin level 0.5 now at top of range after being held for 2 hr.  No bleeding noted, CBC stable  Will restart heparin drip at lower rate and recheck this evening Heparin drip restarted at 2000 uts/htr HL 0.56 slightly > goal   Goal of Therapy:  Heparin level 0.3-0.5 units/mL Monitor platelets by anticoagulation protocol: Yes   Plan:  Decrease heparin drip 1700 uts/hr Daily HL, CBC  Leota Sauers Pharm.D. CPP, BCPS Clinical Pharmacist [phone] 06/06/2017 10:54 PM

## 2017-06-06 NOTE — Progress Notes (Signed)
ANTICOAGULATION CONSULT NOTE - Follow Up Consult  Pharmacy Consult for Heparin  Indication: HVAD  No Active Allergies  Patient Measurements: Height: 6' (182.9 cm) Weight: 245 lb 9.5 oz (111.4 kg) IBW/kg (Calculated) : 77.6  Vital Signs: Temp: 97.8 F (36.6 C) (08/17 1618) Temp Source: Axillary (08/17 1618) BP: 84/71 (08/17 1625) Pulse Rate: 60 (08/17 1500)  Labs:  Recent Labs  06/04/17 0518  06/05/17 0434  06/05/17 1532  06/06/17 0219 06/06/17 1230 06/06/17 1405  HGB 10.2*  --  9.8*  --   --   --  9.2*  --   --   HCT 31.9*  --  30.6*  --   --   --  28.9*  --   --   PLT 323  --  312  --   --   --  291  --   --   LABPROT 18.4*  --  17.3*  --   --   --  19.0*  --   --   INR 1.52  --  1.40  --   --   --  1.57  --   --   HEPARINUNFRC  --   < > 0.13*  < > 0.51  < > 1.01* 1.04* 0.51  CREATININE 2.33*  < > 2.29*  --  2.24*  --  2.06* 1.95*  --   < > = values in this interval not displayed.  Estimated Creatinine Clearance: 55.8 mL/min (A) (by C-G formula based on SCr of 1.95 mg/dL (H)).  Assessment: 56 y/o M continues on heparin s/p HVAD on 04/07/2017. Concerns were raised about PIV not infusing correctly so heparin was switched to central line with peripheral blood draws. Heparin level 0.5 now at top of range after being held for 2 hr.  No bleeding noted, CBC stable  Will restart heparin drip at lower rate and recheck this evening   Goal of Therapy:  Heparin level 0.3-0.5 units/mL Monitor platelets by anticoagulation protocol: Yes   Plan:  -Re-start heparin drip 2000 uts/hr  -Check heparin level at 2200 Daily HL, CBC  Leota Sauers Pharm.D. CPP, BCPS Clinical Pharmacist [phone] 06/06/2017 4:36 PM

## 2017-06-06 NOTE — Progress Notes (Addendum)
Nutrition Consult/Follow Up   DOCUMENTATION CODES:   Severe malnutrition in context of chronic illness  INTERVENTION:    Initiate Nepro with Carb Steady at 15 ml/hr and increase by 10 ml every 4 hours to goal rate of 35 ml/hr. Prostat 60 ml BID.  Total TF regimen to provide 1912 kcals, 128 gm protein, 611 ml free water daily  Glucerna 1.2 >> 51.8 mEq K per L Nepro with Carb Steady >> 27.2 mEq K per L  NUTRITION DIAGNOSIS:   Malnutrition (severe) related to chronic illness (CHF) as evidenced by severe depletion of body fat, severe depletion of muscle mass, ongoing  GOAL:   Patient will meet greater than or equal to 90% of their needs, met  MONITOR:   Vent status, TF tolerance, Diet advancement, PO intake, Labs, Weight trends, Skin, I & O's  ASSESSMENT:   56 yo male with hx of HTN, gout, obesity, chronic edema, CAD, ischemic cardiomyopathy, DM2, AICD, MI, CHF, renal insufficiency who was admitted on 6/22 with acute on chronic systolic heart failure.  6/28  HVAD placement 6/29  Extubated 7/01  Soft diet started, Ensure Enlive added 7/04  Re-intubated 7/05  TPN started 7/06  Extubated 7/11  TPN D/C'd 8/08  Cortrak placed  Patient is currently intubated on ventilator support MV: 8.3 L/min Temp (24hrs), Avg:97.9 F (36.6 C), Min:97.5 F (36.4 C), Max:98.7 F (37.1 C)  RD consulted to change TF formula. K remains high. Glucerna 1.2 formula discontinued 8/16. Cortrak feeding tube remains in place. Spoke with RN. PCCM and Heart Failure Team notes reviewed.   Medications continue to include Mag-Ox, Colace and Levothroid.  Labs reviewed. Na 149 (H). K 5.5 (H). CBG's [id number].  Diet Order:  Diet NPO time specified  Skin:  unstageable pressure injury; sacrum   Last BM:  8/17   Height:   Ht Readings from Last 1 Encounters:  05/31/17 6' (1.829 m)   Weight:   Wt Readings from Last 1 Encounters:  06/06/17 245 lb 9.5 oz (111.4 kg)  Admit weight 210 lb (95.3  kg)  Ideal Body Weight:  80.9 kg  BMI:  Body mass index is 33.31 kg/m. highly skewed   Estimated Nutritional Needs:   Kcal:  1994  Protein:  115-130 gm  Fluid:  per MD  EDUCATION NEEDS:   No education needs identified at this time  Arthur Holms, RD, LDN Pager #: [phone] After-Hours Pager #: [phone]

## 2017-06-06 NOTE — Progress Notes (Signed)
Cards fellow text-paged. Still awaiting response.

## 2017-06-06 NOTE — Progress Notes (Signed)
Pt noted to have suction alarms from 18:56, 19:54, 20:00, and 20:20. Occurred while pt at rest. No rhythm changes, MAPs mid to upper 80s, CVP 5-6.   VAD coordinator paged at 20:37. Molly called back at 20:38, discussed with her. She said she'd text Dr. Gala Romney and call back if he wants to do anything about it right now. Said to contact Bensimhon if pt deteriorates or has major rhythm changes (ex. VT).   Will continue to monitor.

## 2017-06-06 NOTE — Progress Notes (Signed)
Molly VAD coordinator called back, most recent suction alarm noted to be yet unresolved since 20:20 (now 20:58). Advised to contact Dr. Gala Romney for further instruction. MD paged at 20:58.  MD responded at 21:00, said to turn suction alarm off, as there was nothing further he wanted to do at this time since pt is already extremely fluid overloaded, especially since the pt isn't showing any acute changes.   Suction alarm turned off at 21:05. Will continue to monitor.

## 2017-06-06 NOTE — Consult Note (Addendum)
WOC requested to re-assess right leg wounds.  Bedside nurse was concerned with their appearance and LVAD nurse Kirt Boys was at the bedside to visualize the site. Area of aprox 5X5cm with wounds are basically unchanged since previous WOCN description; multiple patchy areas of full thickness loss, thick white crystals in the wound bed, mod amt tan drainage, dark skin surrounding the wound edges which is hard to palpation, no odor. Continue present plan of care with calcium alginate to absorb drainage and foam dressing to protect area. It is difficult to promote wound healing while gout crystals continue to surface; these must be treated systemically with medication. Please re-consult if further assistance is needed.  Thank-you,  Cammie Mcgee MSN, RN, CWOCN, [place], CNS [phone]

## 2017-06-06 NOTE — Progress Notes (Signed)
Inpatient Rehabilitation  Continuing to follow at [facility] for medical readiness and post acute therapy needs. Note that patient has been vent weaning.  Please call with questions.   Charlane Ferretti., CCC/SLP Admission Coordinator  [facility] Inpatient Rehabilitation  Cell [phone]

## 2017-06-06 NOTE — Progress Notes (Signed)
ANTICOAGULATION CONSULT NOTE - Follow Up Consult  Pharmacy Consult for Heparin  Indication: HVAD  No Active Allergies  Patient Measurements: Height: 6' (182.9 cm) Weight: 245 lb 9.5 oz (111.4 kg) IBW/kg (Calculated) : 77.6  Vital Signs: Temp: 97.5 F (36.4 C) (08/17 0859) Temp Source: Axillary (08/17 0859) BP: 94/58 (08/17 1200) Pulse Rate: 63 (08/17 1200)  Labs:  Recent Labs  06/04/17 0518  06/05/17 0434  06/05/17 1532 06/05/17 2207 06/06/17 0219 06/06/17 1230  HGB 10.2*  --  9.8*  --   --   --  9.2*  --   HCT 31.9*  --  30.6*  --   --   --  28.9*  --   PLT 323  --  312  --   --   --  291  --   LABPROT 18.4*  --  17.3*  --   --   --  19.0*  --   INR 1.52  --  1.40  --   --   --  1.57  --   HEPARINUNFRC  --   < > 0.13*  < > 0.51 1.34* 1.01* 1.04*  CREATININE 2.33*  < > 2.29*  --  2.24*  --  2.06*  --   < > = values in this interval not displayed.  Estimated Creatinine Clearance: 52.8 mL/min (A) (by C-G formula based on SCr of 2.06 mg/dL (H)).  Assessment: 56 y/o M continues on heparin s/p HVAD on 04/10/2017. Concerns were raised about PIV not infusing correctly so heparin was switched to central line with peripheral blood draws. Heparin level drawn a little early but still remains elevated despite rate decrease. Again confirmed with RN that heparin running centrally and lab was drawn peripherally.   Heparin level this afternoon remains elevated - was drawn correctly.  No overt bleeding noted.  Goal of Therapy:  Heparin level 0.3-0.5 units/mL Monitor platelets by anticoagulation protocol: Yes   Plan:  -Hold heparin x 1 hr again -Will recheck heparin level at the end of 1 hr -Re-start heparin as appropriate once level reduced. -Check heparin level at 1400.  Tad Moore, BCPS  Clinical Pharmacist Pager [phone]  06/06/2017 1:03 PM

## 2017-06-06 NOTE — Progress Notes (Signed)
Cards fellow text-paged regarding persisting suction alarms despite receiving 1L total saline bolus over two hours. CVP up to 6 from 4. Alarms slightly less frequent than when they started. VSS. Pt resting.   Awaiting orders. Will continue to monitor.

## 2017-06-06 NOTE — Progress Notes (Signed)
Cards fellow called back, gave orders for 500cc NS bolus. Will administer now. Will continue to monitor pt's fluid status.

## 2017-06-06 NOTE — Plan of Care (Signed)
Problem: Respiratory: Goal: Ability to maintain a clear airway and adequate ventilation will improve Outcome: Progressing Suctioning airway secretions as needed.

## 2017-06-06 NOTE — Progress Notes (Signed)
ANTICOAGULATION CONSULT NOTE - Follow Up Consult  Pharmacy Consult for Heparin  Indication: HVAD  No Active Allergies  Patient Measurements: Height: 6' (182.9 cm) Weight: 240 lb 4.8 oz (109 kg) IBW/kg (Calculated) : 77.6  Vital Signs: Temp: 97.9 F (36.6 C) (08/17 0300) Temp Source: Oral (08/17 0300) BP: 90/64 (08/17 0400) Pulse Rate: 60 (08/17 0500)  Labs:  Recent Labs  06/04/17 0518  06/05/17 0434  06/05/17 1532 06/05/17 2207 06/06/17 0219  HGB 10.2*  --  9.8*  --   --   --  9.2*  HCT 31.9*  --  30.6*  --   --   --  28.9*  PLT 323  --  312  --   --   --  291  LABPROT 18.4*  --  17.3*  --   --   --  19.0*  INR 1.52  --  1.40  --   --   --  1.57  HEPARINUNFRC  --   < > 0.13*  < > 0.51 1.34* 1.01*  CREATININE 2.33*  < > 2.29*  --  2.24*  --  2.06*  < > = values in this interval not displayed.  Estimated Creatinine Clearance: 52.3 mL/min (A) (by C-G formula based on SCr of 2.06 mg/dL (H)).  Assessment: 56 y/o M continues on heparin s/p HVAD on 05/17/2017. Concerns were raised about PIV not infusing correctly so heparin was switched to central line with peripheral blood draws. Heparin level drawn a little early but still remains elevated despite rate decrease. Again confirmed with RN that heparin running centrally and lab was drawn peripherally.   Goal of Therapy:  Heparin level 0.3-0.5 units/mL Monitor platelets by anticoagulation protocol: Yes   Plan:  -Hold heparin x 1 hr again -Re-start heparin at 2200 units/hr at 0600 -Check HL at 1300  Abran Duke 06/06/2017,5:41 AM

## 2017-06-06 NOTE — Progress Notes (Signed)
Pharmacy Antibiotic Note  James [name] is a 56 y.o. male being treated for sepsis secondary to sacral wound vs UTI. Pharmacy has been consulted for vancomycin and meropenem dosing.   Discussed plan with HF team, also MD noted rash, patient does not have any PCN allergies listed and has received several related antibiotics including zosyn and ceftriaxone with no noted complications. Will follow closely for now and consider changing to FQ or aztreonam if rash worsens.   Vancomycin random level today = 15 about 48 hrs after dose.  Plan: -Give vancomycin 750 mg IV x 1 now. -Continue meropenem 1g IV q12h  -Monitor SCr, UOP, fever curve, cultures -Will recheck vancomycin level soon.   Height: 6' (182.9 cm) Weight: 245 lb 9.5 oz (111.4 kg) IBW/kg (Calculated) : 77.6  Temp (24hrs), Avg:97.9 F (36.6 C), Min:97.5 F (36.4 C), Max:98.7 F (37.1 C)   Recent Labs Lab 06/01/17 0923 06/02/17 0321  06/03/17 0421 06/04/17 0518 06/04/17 1600 06/05/17 0434 06/05/17 1532 06/06/17 0219 06/06/17 1230  WBC  --  22.2*  --  23.6* 25.0*  --  16.1*  --  13.5*  --   CREATININE  --  2.43*  --  2.32* 2.33* 2.19* 2.29* 2.24* 2.06* 1.95*  VANCOTROUGH 30*  --   --   --   --   --   --   --   --  15  VANCORANDOM  --   --   < > 25 18  --   --   --   --   --   < > = values in this interval not displayed.  Estimated Creatinine Clearance: 55.8 mL/min (A) (by C-G formula based on SCr of 1.95 mg/dL (H)).    No Active Allergies  Antimicrobials this admission: 7/5 Vanc >>7/8 7/5 Zosyn >> 7/12 8/7>8/8 ctx 8/8 vanc>> 8/8 merrem>>  Dose adjustments this admission: 7/7 VT 39 8/12 VT 30 - hold 8/13 VR 30 - hold 8/14 VR 25 - hold 8/15 VR 18>>750 x1 8/17 VR 15 (48 hrs after dose) > 750 mg x 1  Microbiology results: 7/5 blood x2>> neg 7/5 TA > rare GNR>>reincubate>few klebsiella/citrobacter - pan sensitive 7/7 TA>rare gnr/gpc>>kleb pneumo - r amp otherwise pan sensitive 7/26 MRSA pcr NEG  !!>>precautions d/c'ed after 30d 8/7 bld>>ngtd 8/7 urine>>ng 8/8 TA>>(lots of secretions on intubation per ccm) rare gpc/gpr 8/9 urine >ng 8/11 BCx > ngF 8/11 TA > normal flora 8/12 BCx>ngtd   Thank you for allowing pharmacy to be a part of this patient's care.  Tad Moore, BCPS  Clinical Pharmacist Pager [phone]  06/06/2017 2:02 PM

## 2017-06-06 NOTE — Progress Notes (Signed)
PULMONARY / CRITICAL CARE MEDICINE   Name: James [name] MRN: [id number] DOB: May 15, 1961    ADMISSION DATE:  04/02/2017 CONSULTATION DATE:  04/23/2017  REFERRING MD:  Laneta Simmers - CVTS  CHIEF COMPLAINT:  Acute respiratory failure  BRIEF SUMMARY:  56 y.o. male with history of systolic congestive heart failure with EF 15%, coronary artery disease, chronic renal failure, diabetes, and essential hypertension admitted on 04/06/2017 for ventricular assist device placement. Patient previously turned down by [facility] for transplant due to limitations in his mobility and his underlying gout. Course complicated by acute on chronic renal failure. He has had recurrent episodes of supraventricular tachycardia and GI bleeding. Patient was reintubated on 8/8 with concern for aspiration secondary to increased work of breathing and displaced NG tube. PCCM was consulted for help.  SUBJECTIVE:  Patient was having alarms wit his LVAD per nurse report. No other acute events overnight. Tolerated PS ventilator most of the day yesterday but did require support due to ventilator alarm.   REVIEW OF SYSTEMS:  Unable to obtain as patient is intubated.  VITAL SIGNS: BP 122/80 (BP Location: Left Arm)   Pulse 61   Temp (!) 97.5 F (36.4 C) (Axillary)   Resp 12   Ht 6' (1.829 m)   Wt 245 lb 9.5 oz (111.4 kg)   SpO2 100%   BMI 33.31 kg/m   HEMODYNAMICS: CVP:  [2 mmHg-7 mmHg] 6 mmHg  VENTILATOR SETTINGS: Vent Mode: PRVC FiO2 (%):  [30 %] 30 % Set Rate:  [14 bmp] 14 bmp Vt Set:  [620 mL] 620 mL PEEP:  [5 cmH20] 5 cmH20 Pressure Support:  [8 cmH20] 8 cmH20 Plateau Pressure:  [15 cmH20-26 cmH20] 26 cmH20  INTAKE / OUTPUT: I/O last 3 completed shifts: In: 4358 [I.V.:848; Other:110; NG/GT:2150; IV Piggyback:1250] Out: 4350 [Urine:2450; Stool:1900]  PHYSICAL EXAMINATION: General:  Wife at bedside. No distress. Appears comfortable. Integument:  Warm. Dry. No rash appreciated. HEENT: no scleral  icterus. No scleral injection. Endotracheal tube remains in place. Cardiovascular:  LVAD hum noted. Regular rate. Unable to appreciate JVD. Anasarca. Pulmonary:  Symmetric chest wall rise on ventilator. Distant and obscured breath sounds with LVAD. Abdomen:  Nondistended. Soft. Normoactive bowel sounds. Musculoskeletal:  Diffuse joint swelling of the hands with gout. Neurological:  Nodding to questions. Following commands.  LABS:  BMET  Recent Labs Lab 06/05/17 0434 06/05/17 1532 06/06/17 0219  NA 146* 146* 149*  K 5.9* 6.1* 5.5*  CL 113* 115* 116*  CO2 26 24 24   BUN 90* 91* 88*  CREATININE 2.29* 2.24* 2.06*  GLUCOSE 226* 207* 141*   Electrolytes  Recent Labs Lab 05/31/17 0322  06/05/17 0434 06/05/17 1532 06/06/17 0219  CALCIUM 7.8*  < > 7.8* 7.9* 8.0*  MG 2.0  --   --   --   --   < > = values in this interval not displayed. CBC  Recent Labs Lab 06/04/17 0518 06/05/17 0434 06/06/17 0219  WBC 25.0* 16.1* 13.5*  HGB 10.2* 9.8* 9.2*  HCT 31.9* 30.6* 28.9*  PLT 323 312 291   Coag's  Recent Labs Lab 06/04/17 0518 06/05/17 0434 06/06/17 0219  INR 1.52 1.40 1.57   Sepsis Markers No results for input(s): LATICACIDVEN, PROCALCITON, O2SATVEN in the last 168 hours. ABG No results for input(s): PHART, PCO2ART, PO2ART in the last 168 hours. Liver Enzymes  Recent Labs Lab 06/03/17 1233 06/04/17 0518  AST 62* 52*  ALT 26 23  ALKPHOS 193* 174*  BILITOT 0.8 0.8  ALBUMIN 1.8* 1.7*    Cardiac Enzymes No results for input(s): TROPONINI, PROBNP in the last 168 hours.  Glucose  Recent Labs Lab 06/05/17 0832 06/05/17 1143 06/05/17 1611 06/05/17 1958 06/06/17 0009 06/06/17 0303  GLUCAP 176* 145* 197* 220* 166* 129*    Imaging No results found.   IMAGING/STUDIES: CT HEAD W/O 8/10: IMPRESSION: 1. No acute intracranial process. 2. Stable examination including mild atrophy, moderate chronic small vessel ischemic disease and old cerebellar  infarcts. PORT CXR 8/11:  Previously reviewed by me. No focal opacity. Right upper extremity central venous catheter in place. Endotracheal tube in good positioning. Enteric feeding tube coursing below diaphragm. PORT CXR 8/14:  Previously reviewed by me. LVAD in place. No new focal opacity. No pleural effusion appreciated. Endotracheal tube and central venous catheter in good position. Enteric feeding tube coursing below diaphragm. Persistent silhouetting of left hemidiaphragm and lower lung opacity.  MICROBIOLOGY: MRSA PCR 6/22:  Positive Blood Cultures x2 7/5:  Negative  Tracheal Aspirate Culture 7/5:  Citrobacter koseri & Klebsiella pneumoniae  Tracheal Aspirate Culture 7/7:  Klebsiella pneumoniae MRSA PCR 7/26:  Negative  Urine Culture 8/7:  Multiple Species  Blood Cultures x3 8/7:  Negative  Tracheal Aspirate Culture 8/8:  Multiple Organisms Present Urine Culture 8/9:  Negative Blood Cultures x2 8/11 >>> Tracheal Aspirate Culture 8/11:  Negative  Blood Cultures x2 8/12 >>>  ANTIBIOTICS: Zinacef 6/28 (periop) Zosyn 7/5 - 7/13 Vancomycin 7/5 - 7/7; 8/8 >>> Merrem 8/8 >>>  SIGNIFICANT EVENTS: 06/22 - Admit  06/28 - Implanting of LVAD 07/05 - Respiratory arrest post feculent vomiting episode 07/06 - Extubated 07/17 - Intubated during EGD 07/18 - Extubated 08/08 - Respiratory distress & intubated. Lasix x2 doses given. 08/13 - Worsened renal function w/ Lasix w/o significant diuresis. Weaned on PS some.  08/14 - Lasix given by Cardiology 08/15 - Weaned off Levophed 08/16 - Tolerated PS 8/5 for the majority of the day. Tube feedings stopped as possible contributor to hyperkalemia.  LINES/TUBES: OETT 6/28 - 6/29 ; 7/5 - 7/6; 7/17-7/18; 8/8 >>> RUE TL PICC 7/2 >>> R NGT >>> FOLEY >>> RECTAL TUBE >>>  ASSESSMENT / PLAN:    PULMONARY A: Acute, Recurrent Hypoxic Respiratory Failure: Patient and family would not want tracheostomy. Aspiration Pneumonia:  Secondary to tube  feedings.  P:   Continuing daily pressure support wean Ventilator support at night for rest Planning for one way extubation on 8/22  CARDIOVASCULAR A:  Acute on chronic systolic congestive heart failure: Turned down for heart transplant. Status post LVAD. Atrial fibrillation Recurrent ventricular tachycardia Mediastinal hematoma: Status post evacuation post-LVAD. H/O essential hypertension  P:  Monitoring vitals per unit protocol Continuous telemetry monitoring  Management per cardiology & cardiothoracic surgery  RENAL A:   Acute on chronic renal failure stage III: Stable. Hyperkalemia: Marginal improvement.  Hypernatremia: Oscillating.  P:   Giving Kayexalate 30gm today again Diuresis as per primary service Hold catheter to monitor urine output Trending renal function and electrolytes  GASTROINTESTINAL A:   Severe protein-calorie malnutrition Chronic mesenteric ischemia  P:   Nothing by mouth  HEMATOLOGIC A:   Anemia:  Hemoglobin stable. Multifactorial with contribution from mediastinal hematoma. No evidence of active bleeding. Leukocytosis: Improving. Coagulopathy: Secondary to systemic and coagulation.   P:  Continuing to trend cell counts daily Trending INR daily Transfusion goal per primary service  INFECTIOUS A:   Aspiration pneumonia FUO: Cultures repeatedly negative.  P:   Continuing empiric antibiotics as above Awaiting finalization of  culture results Plan to repeat for any fever  ENDOCRINE A:   Diabetes Mellitus Type 2:  Glucose better controlled today. Hypothyroidism    P:   Continuing Synthroid via tube daily Continuing Lantus 15 units subcutaneous daily Continuing Accu-Cheks every 4 hours Continuing sliding scale insulin per moderate algorithm  NEUROLOGIC/MUSCULOSKELETAL A:   Sedation on ventilator Gout  P:   Desired RASS:  0 to -1 AED:  Keppra via tube BID. Continuing Colchicine (started 8/13) Continuing Prednisone 20mg   daily  Fentanyl IV prn pain Continuing Oxycodone prn pain  Prophylaxis: Protonix via tube twice a day & heparin drip per pharmacy protocol. Diet:  NPO.  Nurse to discuss switching to Renal Tube feeding for nutrition.  Code Status:  Partial Code as per previous physician discussions. Disposition:  Remains critically ill in the ICU. Plan for one-way extubation when family ready for at 2 week mark (8/22). Family Update:  Wife updated during rounds this morning.   DISCUSSION:  56 y.o. male status post LVAD placement. Continuing daily pressure support wean. Continuing to plan for one-way extubation on 8/22.  Remainder of care as per primary service and other consultants.  I have spent a total of 34 minutes of critical care time today caring for the patient, updating his wife at bedside, and reviewing the patient's electronic medical record.   Donna Christen Jamison Neighbor, M.D. [facility] Pulmonary & Critical Care Pager:  [phone] After 3pm or if no response, call [phone] 06/06/2017, 9:08 AM

## 2017-06-06 NOTE — Progress Notes (Signed)
Cards fellow paged. Awaiting response.

## 2017-06-06 NOTE — Progress Notes (Addendum)
ANTICOAGULATION CONSULT NOTE - Follow Up Consult  Pharmacy Consult for Heparin  Indication: HVAD  No Active Allergies  Patient Measurements: Height: 6' (182.9 cm) Weight: 240 lb 4.8 oz (109 kg) IBW/kg (Calculated) : 77.6  Vital Signs: Temp: 97.7 F (36.5 C) (08/16 2317) Temp Source: Oral (08/16 2317) BP: 81/63 (08/16 2300) Pulse Rate: 61 (08/16 2317)  Labs:  Recent Labs  06/03/17 0421  06/04/17 0518  06/04/17 1600  06/05/17 0434 06/05/17 1316 06/05/17 1532 06/05/17 2207  HGB 9.7*  --  10.2*  --   --   --  9.8*  --   --   --   HCT 30.6*  --  31.9*  --   --   --  30.6*  --   --   --   PLT 270  --  323  --   --   --  312  --   --   --   LABPROT 20.5*  --  18.4*  --   --   --  17.3*  --   --   --   INR 1.73  --  1.52  --   --   --  1.40  --   --   --   HEPARINUNFRC  --   < >  --   < >  --   < > 0.13* 0.17* 0.51 1.34*  CREATININE 2.32*  --  2.33*  --  2.19*  --  2.29*  --  2.24*  --   < > = values in this interval not displayed.  Estimated Creatinine Clearance: 48.1 mL/min (A) (by C-G formula based on SCr of 2.24 mg/dL (H)).  Assessment: 56 y/o M continues on heparin s/p HVAD on 04/10/2017. Concerns were raised about PIV not infusing correctly so heparin was switched to central line with peripheral blood draws. Heparin level is now elevated at 1.34. Confirmed not drawn from central line.   Goal of Therapy:  Heparin level 0.3-0.5 units/mL Monitor platelets by anticoagulation protocol: Yes   Plan:  -Hold heparin x 1 hr -Re-start heparin at 2600 units/hr at 0200  -Check HL at 0900   Abran Duke 06/06/2017,12:35 AM

## 2017-06-06 NOTE — Progress Notes (Signed)
VAD on call coordinator paged. Pt has had as of now 4 suction alarms in the last 20 minutes that have self resolved. Discussed pt's vitals. CVP has been between 5-7 most of night, MAPs in 80s. No rhythm changes. Kirt Boys said to call MD to address CVP, as low volume is likely the cause.   Will call MD to address. Will continue to monitor.

## 2017-06-07 DIAGNOSIS — E870 Hyperosmolality and hypernatremia: Secondary | ICD-10-CM

## 2017-06-07 LAB — BASIC METABOLIC PANEL
ANION GAP: 7 (ref 5–15)
BUN: 80 mg/dL — ABNORMAL HIGH (ref 6–20)
CHLORIDE: 118 mmol/L — AB (ref 101–111)
CO2: 26 mmol/L (ref 22–32)
Calcium: 8.2 mg/dL — ABNORMAL LOW (ref 8.9–10.3)
Creatinine, Ser: 1.81 mg/dL — ABNORMAL HIGH (ref 0.61–1.24)
GFR, EST AFRICAN AMERICAN: 47 mL/min — AB (ref >60)
GFR, EST NON AFRICAN AMERICAN: 41 mL/min — AB (ref >60)
Glucose, Bld: 178 mg/dL — ABNORMAL HIGH (ref 65–99)
POTASSIUM: 5 mmol/L (ref 3.5–5.1)
SODIUM: 151 mmol/L — AB (ref 135–145)

## 2017-06-07 LAB — COOXEMETRY PANEL
CARBOXYHEMOGLOBIN: 1 % (ref 0.5–1.5)
Methemoglobin: 1.2 % (ref 0.0–1.5)
O2 SAT: 80.3 %
Total hemoglobin: 8.9 g/dL — ABNORMAL LOW (ref 12.0–16.0)

## 2017-06-07 LAB — GLUCOSE, CAPILLARY
GLUCOSE-CAPILLARY: 160 mg/dL — AB (ref 65–99)
GLUCOSE-CAPILLARY: 165 mg/dL — AB (ref 65–99)
GLUCOSE-CAPILLARY: 167 mg/dL — AB (ref 65–99)
GLUCOSE-CAPILLARY: 251 mg/dL — AB (ref 65–99)
Glucose-Capillary: 209 mg/dL — ABNORMAL HIGH (ref 65–99)
Glucose-Capillary: 203 mg/dL — ABNORMAL HIGH (ref 65–99)

## 2017-06-07 LAB — CBC
HCT: 29.4 % — ABNORMAL LOW (ref 39.0–52.0)
HEMOGLOBIN: 9.4 g/dL — AB (ref 13.0–17.0)
MCH: 28.2 pg (ref 26.0–34.0)
MCHC: 32 g/dL (ref 30.0–36.0)
MCV: 88.3 fL (ref 78.0–100.0)
PLATELETS: 317 10*3/uL (ref 150–400)
RBC: 3.33 MIL/uL — AB (ref 4.22–5.81)
RDW: 17.7 % — ABNORMAL HIGH (ref 11.5–15.5)
WBC: 12.3 10*3/uL — AB (ref 4.0–10.5)

## 2017-06-07 LAB — PROTIME-INR
INR: 1.56
Prothrombin Time: 18.8 seconds — ABNORMAL HIGH (ref 11.4–15.2)

## 2017-06-07 LAB — HEPARIN LEVEL (UNFRACTIONATED)
HEPARIN UNFRACTIONATED: 0.51 [IU]/mL (ref 0.30–0.70)
HEPARIN UNFRACTIONATED: 0.47 [IU]/mL (ref 0.30–0.70)

## 2017-06-07 LAB — LACTATE DEHYDROGENASE: LDH: 131 U/L (ref 98–192)

## 2017-06-07 LAB — VANCOMYCIN, RANDOM: Vancomycin Rm: 17

## 2017-06-07 MED ORDER — VANCOMYCIN HCL IN DEXTROSE 750-5 MG/150ML-% IV SOLN
750.0000 mg | INTRAVENOUS | Status: DC
  Administered 2017-06-07 – 2017-06-11 (×5): 750 mg via INTRAVENOUS
  Filled 2017-06-07 (×8): qty 150

## 2017-06-07 MED ORDER — SODIUM CHLORIDE 0.9 % IV SOLN
1.0000 g | Freq: Three times a day (TID) | INTRAVENOUS | Status: DC
  Administered 2017-06-07 – 2017-06-13 (×18): 1 g via INTRAVENOUS
  Filled 2017-06-07 (×18): qty 1

## 2017-06-07 MED ORDER — HEPARIN (PORCINE) IN NACL 100-0.45 UNIT/ML-% IJ SOLN
1600.0000 [IU]/h | INTRAMUSCULAR | Status: DC
  Administered 2017-06-07 – 2017-06-17 (×14): 1600 [IU]/h via INTRAVENOUS
  Filled 2017-06-07 (×16): qty 250

## 2017-06-07 MED ORDER — FREE WATER
75.0000 mL | Freq: Four times a day (QID) | Status: DC
  Administered 2017-06-07 – 2017-06-09 (×8): 75 mL

## 2017-06-07 NOTE — Progress Notes (Signed)
Pharmacy Antibiotic Note  James [name] is a 56 y.o. male being treated for sepsis secondary to sacral wound vs UTI. Pharmacy has been consulted for vancomycin and meropenem dosing.   Discussed plan with HF team, also MD noted rash, patient does not have any PCN allergies listed and has received several related antibiotics including zosyn and ceftriaxone with no noted complications. Will follow closely for now and consider changing to FQ or aztreonam if rash worsens.   Vancomycin random level today = 17 about 24 hrs after dose.  Plan: -Give vancomycin 750 mg IV q 24 hrs for now. -Increase meropenem to 1g IV q 8hrs given improving renal function.  -Monitor SCr, UOP, fever curve, cultures -Could antibiotics be de-escalated soon?   Height: 6' (182.9 cm) Weight: 244 lb 11.4 oz (111 kg) IBW/kg (Calculated) : 77.6  Temp (24hrs), Avg:98.1 F (36.7 C), Min:97.8 F (36.6 C), Max:98.5 F (36.9 C)   Recent Labs Lab 06/01/17 0923  06/03/17 0421 06/04/17 0518  06/05/17 0434 06/05/17 1532 06/06/17 0219 06/06/17 1230 06/07/17 0306 06/07/17 1357  WBC  --   < > 23.6* 25.0*  --  16.1*  --  13.5*  --  12.3*  --   CREATININE  --   < > 2.32* 2.33*  < > 2.29* 2.24* 2.06* 1.95* 1.81*  --   VANCOTROUGH 30*  --   --   --   --   --   --   --  15  --   --   VANCORANDOM  --   < > 25 18  --   --   --   --   --   --  17  < > = values in this interval not displayed.  Estimated Creatinine Clearance: 60.1 mL/min (A) (by C-G formula based on SCr of 1.81 mg/dL (H)).    No Active Allergies  Antimicrobials this admission: 7/5 Vanc >>7/8 7/5 Zosyn >> 7/12 8/7>8/8 ctx 8/8 vanc>> 8/8 merrem>>  Dose adjustments this admission: 7/7 VT 39 8/12 VT 30 - hold 8/13 VR 30 - hold 8/14 VR 25 - hold 8/15 VR 18>>750 x1 8/17 VR 15 (48 hrs after dose) > 750 mg x 1 8/18 VR 17 (24 hrs after dose) > 750 mg q 24 hrs  Microbiology results: 7/5 blood x2>> neg 7/5 TA > rare GNR>>reincubate>few  klebsiella/citrobacter - pan sensitive 7/7 TA>rare gnr/gpc>>kleb pneumo - r amp otherwise pan sensitive 7/26 MRSA pcr NEG !!>>precautions d/c'ed after 30d 8/7 bld>>ngtd 8/7 urine>>ng 8/8 TA>>(lots of secretions on intubation per ccm) rare gpc/gpr 8/9 urine >ng 8/11 BCx > ngF 8/11 TA > normal flora 8/12 BCx>neg   Thank you for allowing pharmacy to be a part of this patient's care.  Tad Moore, BCPS  Clinical Pharmacist Pager [phone]  06/07/2017 2:39 PM

## 2017-06-07 NOTE — Progress Notes (Signed)
Patient ID: James [name], male   DOB: 1961-04-09, 56 y.o.   MRN: [id number]   Advanced Heart Failure VAD Team Note  Subjective:    Events: --HVAD placed 6/28.  Returned to the OR that evening with high chest tube output, evacuation of mediastinal hematoma.  --Extubated 6/29. Milrinone stopped on 7/4. --7/4, patient developed ileus with respiratory compromise. NGT placed with 3 L suctioned out.  CXR with suspicion for aspiration PNA.  Patient had to be intubated.  He went into atrial fibrillation with RVR.  He became hypotensive and was started on norepinephrine and phenylephrine.  Amiodarone gtt begun.    --Extubated again on 7/6.  -- 7/13 Had 2 sustained episodes of VT. Had to be cardioverted 1. Second episode broke with overdrive pacing with Dr. Caryl Comes. VAD speed turned down to 2700.  --Melena due to acuteGI bleed --> 2 units PRBCs 7/14, 2 units 7/15. 3 U PRBCs 7/16,  05/15/2017 3 UPRBCs.  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  He had epinephrine, APC, and 2 clips. Intubated prior to procedure and placed on norepinephrine. Speed dropped to 2660.  --7/18 Back in VT with overdrive pacing. Lidocaine was started in addition to amiodarone. Extubated. --7/19 lidocaine stopped with confusion. Neuro consulted.  EEG normal.  CT of head no acute findings. 7/20 confusion had resolved.  --1 unit PRBCs 7/20.  --7/22, developed sustained VT with rate around 130 shortly after getting IV Lasix.  We were able to pace him out and back to NSR.  He was put back on amiodarone gtt.  --More VT on 7/25, paced out.  Back on IV amiodarone gtt.  NSR.  -- 7/31 Ramp ECHO decrease speed 2600 rpm.  -- 8/1 VT- paced out -- 8/2 VT - started on sotalol -- 8/6 Hgb 7.5, 1 unit PRBCs -- 8/7 Hgb 9.5, confused.  Head CT with right superior cerebellar artery lacunar infarcts, not acute. -- 8/8 Possible aspiration. Reintubated. -- 8/10 possible seizure => Depakote.  CT head no  change.  1 unit PRBCs.   Remains intubated and awake on vent. C/o left arm sore.   Multiple suction alarms last night which persisted so suction alarm deactivated. No VT. CVP 7 today but remains with total body fluid overload. Co-ox 80. Sodium 149-> 150->151  Remains on heparin and vanc/meropenem  HVAD INTERROGATION:  HVAD:  Flow 4.1 liters/min, speed 2540,  power 3.0 W,  Peak 6.6 Trough 2.1. suction alarms off Suction On. Lavare On.     Objective:    Vital Signs:   Temp:  [97.8 F (36.6 C)-98.5 F (36.9 C)] 97.9 F (36.6 C) (08/18 0823) Pulse Rate:  [60-67] 65 (08/18 0800) Resp:  [13-22] 15 (08/18 0800) BP: (84-123)/(58-87) 104/76 (08/18 0816) SpO2:  [100 %] 100 % (08/18 0816) FiO2 (%):  [30 %] 30 % (08/18 0816) Weight:  [111 kg (244 lb 11.4 oz)] 111 kg (244 lb 11.4 oz) (08/18 0500) Last BM Date: 06/06/17 (stool in rectal tube) Mean arterial Pressure 80-90s Intake/Output:   Intake/Output Summary (Last 24 hours) at 06/07/17 0902 Last data filed at 06/07/17 0800  Gross per 24 hour  Intake          1686.75 ml  Output             2285 ml  Net          -598.25 ml     Physical Exam  CVP 7 cm  Physical Exam: GENERAL: Intubated.  Awake on vent.  Anicteric.  HEENT: +ETT. Cor-track. NECK: Supple, JVP 7 Carotids OK.  CARDIAC:  Mechanical heart sounds with LVAD hum present.  LUNGS:  Coarse on vent  ABDOMEN:  NT, + distended , no HSM. No bruits or masses. Hypoactive BS  LVAD exit site: Dressing dry and intact. No erythema or drainage. Stabilization device present and accurately applied. Driveline dressing changed daily per sterile technique. EXTREMITIES:  Warm and dry. No cyanosis, clubbing, or rash. Diffuse anasarca. Chronic gouty changes bilateral hands. Wounds on legs  Left arm and legs extremely edmeatous. R arm ok  NEUROLOGIC:  Intubated. Follows commands.  SKIN: Sacrum with unstageable pressure ulcer. Wounds on LEs   Telemetry   Personally reviewed, NSR with V pacing at  60s. No VT  Labs   Basic Metabolic Panel:  Recent Labs Lab 06/05/17 0434 06/05/17 1532 06/06/17 0219 06/06/17 0900 06/06/17 1230 06/07/17 0306  NA 146* 146* 149*  --  150* 151*  K 5.9* 6.1* 5.5*  --  5.3* 5.0  CL 113* 115* 116*  --  119* 118*  CO2 26 24 24   --  26 26  GLUCOSE 226* 207* 141*  --  106* 178*  BUN 90* 91* 88*  --  83* 80*  CREATININE 2.29* 2.24* 2.06*  --  1.95* 1.81*  CALCIUM 7.8* 7.9* 8.0*  --  8.1* 8.2*  MG  --   --   --  1.9  --   --     Liver Function Tests:  Recent Labs Lab 06/03/17 1233 06/04/17 0518  AST 62* 52*  ALT 26 23  ALKPHOS 193* 174*  BILITOT 0.8 0.8  PROT 4.9* 5.0*  ALBUMIN 1.8* 1.7*   No results for input(s): LIPASE, AMYLASE in the last 168 hours. No results for input(s): AMMONIA in the last 168 hours.  CBC:  Recent Labs Lab 06/02/17 0321 06/03/17 0421 06/04/17 0518 06/05/17 0434 06/06/17 0219 06/07/17 0306  WBC 22.2* 23.6* 25.0* 16.1* 13.5* 12.3*  NEUTROABS 20.0*  --   --   --   --   --   HGB 9.3* 9.7* 10.2* 9.8* 9.2* 9.4*  HCT 28.8* 30.6* 31.9* 30.6* 28.9* 29.4*  MCV 87.8 88.2 88.1 89.0 89.8 88.3  PLT 260 270 323 312 291 317    INR:  Recent Labs Lab 06/03/17 0421 06/04/17 0518 06/05/17 0434 06/06/17 0219 06/07/17 0306  INR 1.73 1.52 1.40 1.57 1.56    Other results:     Imaging   No results found.   Medications:     Scheduled Medications: . amiodarone  200 mg Per Tube BID  . chlorhexidine gluconate (MEDLINE KIT)  15 mL Mouth Rinse BID  . Chlorhexidine Gluconate Cloth  6 each Topical Q0600  . colchicine  0.6 mg Oral Daily  . collagenase   Topical Daily  . febuxostat  120 mg Oral Daily  . feeding supplement (NEPRO CARB STEADY)  1,000 mL Per Tube Q24H  . feeding supplement (PRO-STAT SUGAR FREE 64)  60 mL Per Tube BID  . insulin aspart  0-15 Units Subcutaneous Q4H  . insulin glargine  15 Units Subcutaneous Daily  . levETIRAcetam  500 mg Per Tube BID  . levothyroxine  50 mcg Per NG tube QAC  breakfast  . magnesium oxide  400 mg Per Tube Daily  . mouth rinse  15 mL Mouth Rinse 10 times per day  . mexiletine  300 mg Per Tube Q12H  . pantoprazole sodium  40 mg Per Tube  BID  . predniSONE  20 mg Per Tube Q breakfast  . sodium chloride flush  10-40 mL Intracatheter Q12H  . sotalol  80 mg Per Tube Daily    Infusions: . heparin 1,600 Units/hr (06/07/17 0800)  . meropenem (MERREM) IV Stopped (06/06/17 2153)  . norepinephrine (LEVOPHED) Adult infusion Stopped (06/04/17 2250)  . sodium chloride Stopped (06/06/17 0335)    PRN Medications: acetaminophen (TYLENOL) oral liquid 160 mg/5 mL, albuterol, docusate, fentaNYL (SUBLIMAZE) injection, hydrALAZINE, neomycin-bacitracin-polymyxin, ondansetron (ZOFRAN) IV, oxyCODONE, sodium chloride flush   Patient Profile   56 yo with CAD s/p CABG, ischemic cardiomyopathy/chronic systolic CHF, tophaceous gout, and CKD stage 3 was admitted for diuresis and consideration for LVAD placement. S/p HVAD on 6/28  Assessment/Plan:    1. Acute/chronic systolic CHF s/p HVAD: Ischemic cardiomyopathy.  [employer] ICD.  Echo (6/18) with EF 15%, mildly dilated RV with moderately decreased systolic function.  s/p HVAD placement 6/28 and had to return to OR to evacuate mediastinal hematoma.  He had been weaned off pressors/milrinone, but developed ileus w/ likely aspiration event 7/4, re-intubated, developed afib/RVR requiring norepinephrine. Extubated 7/6.  VAD speed turned down to 2600 with VT. VAD parameters currently look good.  Milrinone stopped 7/17 with recurrent VT.  He was started back on norepinephrine 8/9 due to soft BP after intubation and suspected septic shock.  - Remains off norephineprine. Doppler 84. Continue prn hydral.  - Doing fine centrally. CVP ok. Main issue is massive 3rd spacing but low intravascular volume. Has gotten numerous units of albumin without improvement. Las night with persistent suction alarms so suction alarm deactivated. Fortunately  no VT.  - Will wrap extremities with ACE wraps.  - Will progressive hypernatremia will not give diuretics - Off ASA with GI Bleed  - Goal INR 2-2.5. Today's INR 1.56. Covered with heparin.  D/w PharmD 2. AKI on CKD stage 3:  - Creatinine continues to improve - Not clearing potassium.   3. Symptomatic anemia due to acute UGI bleeding:  Received 3 units PRBCs 7/16 and 3 units PRBCs on 04/24/2017. 1 unit PRBCs 7/20. 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  Requiring 2 clips + epi + APC. 1 unit PRBCs 8/6.  He had 1 unit PRBCs 8/10 to provide volume.  Hemoglobin stable today.    - Got Feraheme 7/28.  - Continue Protonix 40 po bid. - Off aspirin for now.  - Transfuse hgb < 8. Hgb 9.4 4. Ventricular tachycardia: Status post ventricular tachycardia 2 on 7/13. Possible suction event. VAD speed turned down to 2700.  Recurrent VT 7/17. EP called to bedside. Overdrive pacing successful for about 10 minutes but VT recurred.  Milrinone turned off, remained in VT overnight but hemodynamically stable.  Speed decreased to 2600.  On 7/18, we were able to pace him out of VT.  Lidocaine added then stopped due to confusion/mental status changes.  He had VT 7/22 about 30 minutes after getting IV Lasix, had to be paced out again => may have been due to rapid fluid shift.  Pt paced out of VT 3 separate occasions 7/25. Got amio bolus x 2 and started back on IV infusion.  Transitioned to amiodarone 400 mg tid.  On 8/1 had recurrent VT and paced out again.  Recurrent VT 8/2 per EP sotalol started along with amiodarone, sotalol decreased to once daily with bradycardia.  - Continue mexiletine to 300 mg BID, sotalol 80 mg daily, amio 200 mg BID. -  Remains quiescent despite persistent suction events. Continue to follow closely.  - Keep K > 4.0 Mg > 2.0 5. Paroxysmal Atrial fibrillation: Developed atrial fibrillation with RVR in setting of aspiration PNA on 7/4. - In NSR 6. Malnutrition:    -Continue tube feeds via Cor-track. Nutrition following. 7. Aspiration PNA with acute hypoxemic respiratory failure on 7/4: He was extubated on 7/6. Tracheal aspirate with Klebsiella and citrobacter. Both sensitive to Zosyn. Completed abx 7/13. Possible aspiration 8/8.  - Now on vanco/meropenum.    8. Acute Respiratory Failure: Intubated 7/17 in setting of complex GI intervention. Extubated 7/18. Re-intubated 8/8.  Weak respiratory muscles. He has been making progress with vent wean, weaned for 4 hours yesterday.  Wean again today => he would not want tracheostomy, will continue wean towards extubation as long as he is making progress.  If he stops making progress, will need to discuss comfort care.  - D/w CCM. Doing well with vent weaning trials. Likely one-way extubation on Monday 9. CAD: s/p CABG 2012. No chest pain.  Off aspirin due to GI bleeding. No change.   10. Gout: Severe tophaceous gout.  - Continue uloric. No change.  11. Delirium: Resolved. No further, though assessment limited back on Vent.  Appreciate neuro input Possibly related to lidocaine.  Lidocaine stopped. CT of head negative. EEG ok. No further workup per neuro. Neuro  reconsulted 8/7 with AMS => ?related to fever/infection.  He has been more clear on vent.   - CT head 05/27/17 and 8/10 with lacunar infarcts but do not appear acute.   - Seizure activity versus myoclonus => Depakote started.  Switched to [place] with ?drug rash from Depakote.   12. UTI: on vancomycin and meropenum. Urine culture- No growth. .  13. Unstageable Pressure Ulcer: Sacrum. Consult WOC Needs to start santyl daily to enzymatically debride. Reposition R to L. WOC recommendations appreciated.     14. Bradycardia: With hypotension.  Currently back-up pacing set at 60 bpm.  Does not have atrial lead so pacing asynchronously.  Currently, on and off pacing at rate of 60.    15. Hypothyroidism: May be related to amiodarone, he is on Levoxyl. No change.   16. ID:  Suspected septic shock, source = pressure ulcer versus UTI.  Afebrile now, WBCs coming down, BP improving.  Continue meropenem/vancomycin. Cultures so far negative.   - Faint rash, ?drug eruption (may be due to meropenem versus depakote).  Depakote switched out for Keppra. Has improved with switch to Keppra.  17. Rash: ?Drug eruption.  Has not worsened and is faint.  Meropenem versus Depakote may be the culprits. - Depakote stopped and switched for Keppra per Neuro recommendation. Rash resolved 18. Deconditioning:   - Extremely severe. I am not sure that he has enough reserve to survive 19. Hyponatremia - Will give gentle free water bolus down tube  I reviewed the HVAD parameters from today, and compared the results to the patient's prior recorded data.  No programming changes were made.  The HVAD is functioning within specified parameters.    D/w Dr. Cyndia Bent.  CRITICAL CARE Performed by: Glori Bickers  Total critical care time: 45 minutes  Critical care time was exclusive of separately billable procedures and treating other patients.  Critical care was necessary to treat or prevent imminent or life-threatening deterioration.  Critical care was time spent personally by me (independent of midlevel providers or residents) on the following activities: development of treatment plan with patient and/or surrogate as well as  nursing, discussions with consultants, evaluation of patient's response to treatment, examination of patient, obtaining history from patient or surrogate, ordering and performing treatments and interventions, ordering and review of laboratory studies, ordering and review of radiographic studies, pulse oximetry and re-evaluation of patient's condition.    Glori Bickers, MD 06/07/2017, 9:02 AM  VAD Team --- VAD ISSUES ONLY--- Pager [phone] (7am - 7am) Advanced Heart Failure Team  Pager [phone] (M-F; 7a - 4p)  Please contact [facility] for night-coverage after  hours (4p -7a ) and weekends on [email or web address]

## 2017-06-07 NOTE — Progress Notes (Signed)
ANTICOAGULATION CONSULT NOTE - Follow Up Consult  Pharmacy Consult for Heparin  Indication: HVAD  No Active Allergies  Patient Measurements: Height: 6' (182.9 cm) Weight: 244 lb 11.4 oz (111 kg) IBW/kg (Calculated) : 77.6  Vital Signs: Temp: 97.9 F (36.6 C) (08/18 1146) Temp Source: Oral (08/18 1146) BP: 109/83 (08/18 1400) Pulse Rate: 62 (08/18 1400)  Labs:  Recent Labs  06/05/17 0434  06/06/17 0219 06/06/17 1230  06/06/17 2152 06/07/17 0306 06/07/17 1333  HGB 9.8*  --  9.2*  --   --   --  9.4*  --   HCT 30.6*  --  28.9*  --   --   --  29.4*  --   PLT 312  --  291  --   --   --  317  --   LABPROT 17.3*  --  19.0*  --   --   --  18.8*  --   INR 1.40  --  1.57  --   --   --  1.56  --   HEPARINUNFRC 0.13*  < > 1.01* 1.04*  < > 0.56 0.51 0.47  CREATININE 2.29*  < > 2.06* 1.95*  --   --  1.81*  --   < > = values in this interval not displayed.  Estimated Creatinine Clearance: 60.1 mL/min (A) (by C-G formula based on SCr of 1.81 mg/dL (H)).  Assessment: 56 y/o M continues on heparin s/p HVAD on 04/18/2017. Concerns were raised about PIV not infusing correctly so heparin was switched to central line with peripheral blood draws. Heparin levels initially elevated after switch, now back in range after several rate adjustments.  No bleeding noted, CBC stable   AM heparin level 0.47, within goal range.  Goal of Therapy:  Heparin level 0.3-0.5 units/mL Monitor platelets by anticoagulation protocol: Yes   Plan:  Continue heparin drip 1600 uts/hr Daily heparin level and CBC.  Tad Moore, BCPS  Clinical Pharmacist Pager [phone]  06/07/2017 2:46 PM

## 2017-06-07 NOTE — Progress Notes (Signed)
Patient ID: James [name], male   DOB: 26-Jun-1961, 56 y.o.   MRN: [id number] HVAD Rounding Note  Subjective:    Remains intubated but awake and follows commands.  Multiple suction alarms overnight and alarm turned off by DB. He has remained stable with no VT. CVP remains low at 7. Marked total body fluid overload due to low albumin and anasarca.  Co-ox 80.3  LVAD INTERROGATION:  HVAD:  Flow 4.1 liters/min, speed 2540, power 3   Objective:    Vital Signs:   Temp:  [97.5 F (36.4 C)-98.5 F (36.9 C)] 97.9 F (36.6 C) (08/18 0823) Pulse Rate:  [60-67] 65 (08/18 0800) Resp:  [13-22] 15 (08/18 0800) BP: (84-123)/(58-87) 104/76 (08/18 0816) SpO2:  [100 %] 100 % (08/18 0816) FiO2 (%):  [30 %] 30 % (08/18 0816) Weight:  [111 kg (244 lb 11.4 oz)] 111 kg (244 lb 11.4 oz) (08/18 0500) Last BM Date: 06/06/17 (stool in rectal tube) Mean arterial Pressure 88  Intake/Output:   Intake/Output Summary (Last 24 hours) at 06/07/17 0841 Last data filed at 06/07/17 0800  Gross per 24 hour  Intake          1686.75 ml  Output             2285 ml  Net          -598.25 ml     Physical Exam: General:  Intubated, lethargic Cor: LVAD hum present. Lungs: clear Abdomen: soft, nontender, nondistended. No hepatosplenomegaly. No bruits or masses. Good bowel sounds. Extremities: anasarca Neuro: follows commands. Denies pain  Telemetry: paced 65  Labs: Basic Metabolic Panel:  Recent Labs Lab 06/05/17 0434 06/05/17 1532 06/06/17 0219 06/06/17 0900 06/06/17 1230 06/07/17 0306  NA 146* 146* 149*  --  150* 151*  K 5.9* 6.1* 5.5*  --  5.3* 5.0  CL 113* 115* 116*  --  119* 118*  CO2 26 24 24   --  26 26  GLUCOSE 226* 207* 141*  --  106* 178*  BUN 90* 91* 88*  --  83* 80*  CREATININE 2.29* 2.24* 2.06*  --  1.95* 1.81*  CALCIUM 7.8* 7.9* 8.0*  --  8.1* 8.2*  MG  --   --   --  1.9  --   --     Liver Function Tests:  Recent Labs Lab 06/03/17 1233 06/04/17 0518  AST 62* 52*  ALT 26 23   ALKPHOS 193* 174*  BILITOT 0.8 0.8  PROT 4.9* 5.0*  ALBUMIN 1.8* 1.7*   No results for input(s): LIPASE, AMYLASE in the last 168 hours. No results for input(s): AMMONIA in the last 168 hours.  CBC:  Recent Labs Lab 06/02/17 0321 06/03/17 0421 06/04/17 0518 06/05/17 0434 06/06/17 0219 06/07/17 0306  WBC 22.2* 23.6* 25.0* 16.1* 13.5* 12.3*  NEUTROABS 20.0*  --   --   --   --   --   HGB 9.3* 9.7* 10.2* 9.8* 9.2* 9.4*  HCT 28.8* 30.6* 31.9* 30.6* 28.9* 29.4*  MCV 87.8 88.2 88.1 89.0 89.8 88.3  PLT 260 270 323 312 291 317    INR:  Recent Labs Lab 06/03/17 0421 06/04/17 0518 06/05/17 0434 06/06/17 0219 06/07/17 0306  INR 1.73 1.52 1.40 1.57 1.56   Heparin level 0.51  LDH 131  Other results:  EKG:   Imaging:  No results found.   Medications:     Scheduled Medications: . amiodarone  200 mg Per Tube BID  . chlorhexidine gluconate (MEDLINE KIT)  15 mL Mouth Rinse BID  . Chlorhexidine Gluconate Cloth  6 each Topical Q0600  . colchicine  0.6 mg Oral Daily  . collagenase   Topical Daily  . febuxostat  120 mg Oral Daily  . feeding supplement (NEPRO CARB STEADY)  1,000 mL Per Tube Q24H  . feeding supplement (PRO-STAT SUGAR FREE 64)  60 mL Per Tube BID  . insulin aspart  0-15 Units Subcutaneous Q4H  . insulin glargine  15 Units Subcutaneous Daily  . levETIRAcetam  500 mg Per Tube BID  . levothyroxine  50 mcg Per NG tube QAC breakfast  . magnesium oxide  400 mg Per Tube Daily  . mouth rinse  15 mL Mouth Rinse 10 times per day  . mexiletine  300 mg Per Tube Q12H  . pantoprazole sodium  40 mg Per Tube BID  . predniSONE  20 mg Per Tube Q breakfast  . sodium chloride flush  10-40 mL Intracatheter Q12H  . sotalol  80 mg Per Tube Daily     Infusions: . heparin 1,600 Units/hr (06/07/17 0800)  . meropenem (MERREM) IV Stopped (06/06/17 2153)  . norepinephrine (LEVOPHED) Adult infusion Stopped (06/04/17 2250)  . sodium chloride Stopped (06/06/17 0335)      PRN Medications:  acetaminophen (TYLENOL) oral liquid 160 mg/5 mL, albuterol, docusate, fentaNYL (SUBLIMAZE) injection, hydrALAZINE, neomycin-bacitracin-polymyxin, ondansetron (ZOFRAN) IV, oxyCODONE, sodium chloride flush  Assessment/Plan/Discussion:    1. POD 51s/p HVAD for ischemic cardiomyopathy with acute on chronic systolic heart failure, EF 15% with moderate RV dysfunction. Returned to OR for bleeding from raw mediastinal tissues. His VAD parameters have been stable but having suction alarms and it was turned off. CVP low but giving him fluid only helps temporarily because it leaks out of vessels quickly. Will try wrapping legs to mobilize some edema.   2. Acute mental status changes of unclear etiology. This could be related to infection, medications, hypothyroidism, possibly stroke not see on CT. CT head from 8/7 showed lacunar infarcts that did not appear acute. Seen by neurology. EEG does not show seizures activity. Treated infection and it resolved.   3. Acute respiratory failure: reintubated for airway protection. Gas exchange is ok he has been weaning on vent the past several days. Hopefully can extubate early next week. He does not want a trach.  4. Acute on chronic kidney failure. Creat down to 1.8. Urine output ok.   5. GI bleeding and anemia: does not appear to be bleeding at this time.   6. VT: None since Sotalol started in addition to mexiletine and amiodarone. Suction events last night did not stimulate any VT.  7. Fever and Leukocytosis. Nothing cultured. He has only had low grade 99 temp the past week. On Vanc and meropenum.  8. Sacral pressure sore: Continue present treatment.  9.   Nutrition: tolerating tube feeds at goal. Hyperkalemia improved on Nepro.   I reviewed the LVAD parameters from today, and compared the results to the patient's prior recorded data. No programming changes were made. The LVAD is functioning within specified  parameters. LVAD interrogation was negative for any significant power changes, alarms or PI events/speed drops. LVAD equipment check completed andis in good working order. Back-up equipment present.     Length of Stay: [place] 06/07/2017, 8:41 AM

## 2017-06-07 NOTE — Progress Notes (Signed)
ANTICOAGULATION CONSULT NOTE - Follow Up Consult  Pharmacy Consult for Heparin  Indication: HVAD  No Active Allergies  Patient Measurements: Height: 6' (182.9 cm) Weight: 244 lb 11.4 oz (111 kg) IBW/kg (Calculated) : 77.6  Vital Signs: Temp: 98.2 F (36.8 C) (08/18 0400) Temp Source: Axillary (08/18 0400) BP: 107/82 (08/18 0600) Pulse Rate: 66 (08/18 0600)  Labs:  Recent Labs  06/05/17 0434  06/06/17 0219 06/06/17 1230 06/06/17 1405 06/06/17 2152 06/07/17 0306  HGB 9.8*  --  9.2*  --   --   --  9.4*  HCT 30.6*  --  28.9*  --   --   --  29.4*  PLT 312  --  291  --   --   --  317  LABPROT 17.3*  --  19.0*  --   --   --  18.8*  INR 1.40  --  1.57  --   --   --  1.56  HEPARINUNFRC 0.13*  < > 1.01* 1.04* 0.51 0.56 0.51  CREATININE 2.29*  < > 2.06* 1.95*  --   --  1.81*  < > = values in this interval not displayed.  Estimated Creatinine Clearance: 60.1 mL/min (A) (by C-G formula based on SCr of 1.81 mg/dL (H)).  Assessment: 56 y/o M continues on heparin s/p HVAD on 04/06/2017. Concerns were raised about PIV not infusing correctly so heparin was switched to central line with peripheral blood draws. Heparin levels initially elevated after switch, now back in range after several rate adjustments.  No bleeding noted, CBC stable   AM heparin level 0.51, at upper end of goal range.  Goal of Therapy:  Heparin level 0.3-0.5 units/mL Monitor platelets by anticoagulation protocol: Yes   Plan:  Decrease heparin drip 1600 uts/hr Recheck heparin level in 6 hrs. Daily heparin level and CBC.  Tad Moore, BCPS  Clinical Pharmacist Pager [phone]  06/07/2017 7:43 AM

## 2017-06-07 NOTE — Progress Notes (Signed)
PULMONARY / CRITICAL CARE MEDICINE   Name: James [name] MRN: [id number] DOB: 1961-04-04    ADMISSION DATE:  03/28/2017 CONSULTATION DATE:  04/23/2017  REFERRING MD:  Laneta Simmers - CVTS  CHIEF COMPLAINT:  Acute respiratory failure  BRIEF SUMMARY:  56 y.o. male with history of systolic congestive heart failure with EF 15%, coronary artery disease, chronic renal failure, diabetes, and essential hypertension admitted on 03/25/2017 for ventricular assist device placement. Patient previously turned down by [facility] for transplant due to limitations in his mobility and his underlying gout. Course complicated by acute on chronic renal failure. He has had recurrent episodes of supraventricular tachycardia and GI bleeding. Patient was reintubated on 8/8 with concern for aspiration secondary to increased work of breathing and displaced NG tube. PCCM was consulted for help.  SUBJECTIVE: LVAD alarms overnight No other acute events  REVIEW OF SYSTEMS:  Unable to obtain as patient is intubated.  VITAL SIGNS: BP 107/82   Pulse 66   Temp 98.2 F (36.8 C) (Axillary)   Resp 15   Ht 6' (1.829 m)   Wt 111 kg (244 lb 11.4 oz)   SpO2 100%   BMI 33.19 kg/m   HEMODYNAMICS: CVP:  [3 mmHg-9 mmHg] 8 mmHg  VENTILATOR SETTINGS: Vent Mode: PRVC FiO2 (%):  [30 %] 30 % Set Rate:  [14 bmp] 14 bmp Vt Set:  [586 mL-620 mL] 586 mL PEEP:  [5 cmH20] 5 cmH20 Pressure Support:  [5 cmH20-8 cmH20] 8 cmH20 Plateau Pressure:  [14 cmH20-16 cmH20] 16 cmH20  INTAKE / OUTPUT: I/O last 3 completed shifts: In: 3675.3 [I.V.:590.3; Other:60; WU/JW:1191; IV Piggyback:1450] Out: 3325 [Urine:3025; Stool:300]  PHYSICAL EXAMINATION: Gen:      No acute distress HEENT:  EOMI, sclera anicteric, ETT in place Neck:     No masses; no thyromegaly Lungs:    Clear to auscultation bilaterally; normal respiratory effort CV:         LVAD noise. Regular rate and rhythm; no murmurs Abd:      + bowel sounds; soft,  non-tender; no palpable masses, no distension Ext:    2-3 + edema, anasarca; adequate peripheral perfusion Skin:      Warm and dry; no rash Neuro: alert and oriented x 3 Psych: normal mood and affect  LABS:  BMET  Recent Labs Lab 06/06/17 0219 06/06/17 1230 06/07/17 0306  NA 149* 150* 151*  K 5.5* 5.3* 5.0  CL 116* 119* 118*  CO2 24 26 26   BUN 88* 83* 80*  CREATININE 2.06* 1.95* 1.81*  GLUCOSE 141* 106* 178*   Electrolytes  Recent Labs Lab 06/06/17 0219 06/06/17 0900 06/06/17 1230 06/07/17 0306  CALCIUM 8.0*  --  8.1* 8.2*  MG  --  1.9  --   --    CBC  Recent Labs Lab 06/05/17 0434 06/06/17 0219 06/07/17 0306  WBC 16.1* 13.5* 12.3*  HGB 9.8* 9.2* 9.4*  HCT 30.6* 28.9* 29.4*  PLT 312 291 317   Coag's  Recent Labs Lab 06/05/17 0434 06/06/17 0219 06/07/17 0306  INR 1.40 1.57 1.56   Sepsis Markers No results for input(s): LATICACIDVEN, PROCALCITON, O2SATVEN in the last 168 hours. ABG No results for input(s): PHART, PCO2ART, PO2ART in the last 168 hours. Liver Enzymes  Recent Labs Lab 06/03/17 1233 06/04/17 0518  AST 62* 52*  ALT 26 23  ALKPHOS 193* 174*  BILITOT 0.8 0.8  ALBUMIN 1.8* 1.7*    Cardiac Enzymes No results for input(s): TROPONINI, PROBNP in the last  168 hours.  Glucose  Recent Labs Lab 06/06/17 0854 06/06/17 1156 06/06/17 1603 06/06/17 1947 06/06/17 2324 06/07/17 0359  GLUCAP 76 84 151* 182* 188* 165*    Imaging No results found.   IMAGING/STUDIES: CT HEAD W/O 8/10: IMPRESSION: 1. No acute intracranial process. 2. 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 PORT CXR 8/11:  Previously reviewed by me. No focal opacity. Right upper extremity central venous catheter in place. Endotracheal tube in good positioning. Enteric feeding tube coursing below diaphragm. PORT CXR 8/14:  Previously reviewed by me. LVAD in place. No new focal opacity. No  pleural effusion appreciated. Endotracheal tube and central venous catheter in good position. Enteric feeding tube coursing below diaphragm. Persistent silhouetting of left hemidiaphragm and lower lung opacity.  MICROBIOLOGY: MRSA PCR 6/22:  Positive Blood Cultures x2 7/5:  Negative  Tracheal Aspirate Culture 7/5:  Citrobacter koseri & Klebsiella pneumoniae  Tracheal Aspirate Culture 7/7:  Klebsiella pneumoniae MRSA PCR 7/26:  Negative  Urine Culture 8/7:  Multiple Species  Blood Cultures x3 8/7:  Negative  Tracheal Aspirate Culture 8/8:  Multiple Organisms Present Urine Culture 8/9:  Negative Blood Cultures x2 8/11 >>> Tracheal Aspirate Culture 8/11:  Negative  Blood Cultures x2 8/12 >>>  ANTIBIOTICS: Zinacef 6/28 (periop) Zosyn 7/5 - 7/13 Vancomycin 7/5 - 7/7; 8/8 >>> Merrem 8/8 >>>  SIGNIFICANT EVENTS: 06/22 - Admit  06/28 - Implanting of LVAD 07/05 - Respiratory arrest post feculent vomiting episode 07/06 - Extubated 07/17 - Intubated during EGD 07/18 - Extubated 08/08 - Respiratory distress & intubated. Lasix x2 doses given. 08/13 - Worsened renal function w/ Lasix w/o significant diuresis. Weaned on PS some.  08/14 - Lasix given by Cardiology 08/15 - Weaned off Levophed 08/16 - Tolerated PS 8/5 for the majority of the day. Tube feedings stopped as possible contributor to hyperkalemia.  LINES/TUBES: OETT 6/28 - 6/29 ; 7/5 - 7/6; 04/27/08/2018; 8/8 >>> RUE TL PICC 7/2 >>> R NGT >>> FOLEY >>> RECTAL TUBE >>>  ASSESSMENT / PLAN:   56 y.o. male status post LVAD placement. Continuing daily pressure support wean. Continuing to plan for one-way extubation on 8/22.  PULMONARY A: Acute, Recurrent Hypoxic Respiratory Failure: Patient and family would not want tracheostomy. Aspiration Pneumonia:  Secondary to tube feedings.  P:   Continue daily PSV means Continue vent support over the weekend Noted plans for one way extubation next week  CARDIOVASCULAR A:  Acute on  chronic systolic congestive heart failure: Turned down for heart transplant. Status post LVAD. Atrial fibrillation Recurrent ventricular tachycardia Mediastinal hematoma: Status post evacuation post-LVAD. H/O essential hypertension  P:  Continue ICU monitoring Management per cardiology and CVTS  RENAL A:   Acute on chronic renal failure stage III: Stable. Hyperkalemia: Improving s/p kayexelate Hypernatremia: Oscillating.  P:   Follow K, urine outout Diuresis as per primary service  GASTROINTESTINAL A:   Severe protein-calorie malnutrition Chronic mesenteric ischemia  P:   Tube feeds  HEMATOLOGIC A:   Anemia:  Hemoglobin stable. Multifactorial with contribution from mediastinal hematoma. No evidence of active bleeding. Leukocytosis: Improving. Coagulopathy: Secondary to systemic and coagulation.   P:  Follow CBC  INFECTIOUS A:   Aspiration pneumonia FUO: Cultures repeatedly negative.  P:   Continue broad abx coverage Culture if he spikes a temp  ENDOCRINE A:   Diabetes Mellitus Type 2:  Glucose better controlled today. Hypothyroidism    P:   On synthyroid Lantus SSI  NEUROLOGIC/MUSCULOSKELETAL A:   Sedation on ventilator Gout  P:   Desired RASS:  0 to -1 AED:  Keppra via tube BID. Continuing Colchicine (started 8/13) Continuing Prednisone 20mg  daily  Fentanyl IV prn pain Continuing Oxycodone prn pain  Prophylaxis: Protonix, heparin drip Diet: Tube feeds Code Status:  Partial Code as per previous physician discussions. Disposition:  Remains critically ill in the ICU. Plan for one-way extubation when family ready for at 2 week mark (8/22). Family Update:  No family at bedside 8/18  The patient is critically ill with multiple organ system failure and requires high complexity decision making for assessment and support, frequent evaluation and titration of therapies, advanced monitoring, review of radiographic studies and interpretation of  complex data.   Critical Care Time devoted to patient care services, exclusive of separately billable procedures, described in this note is 35 minutes.   Chilton Greathouse MD [facility] Pager [phone] If no answer or after 3pm call: [phone] 06/07/2017, 7:41 AM

## 2017-06-08 ENCOUNTER — Inpatient Hospital Stay (HOSPITAL_COMMUNITY): Payer: Medicare PPO

## 2017-06-08 LAB — GLUCOSE, CAPILLARY
GLUCOSE-CAPILLARY: 128 mg/dL — AB (ref 65–99)
GLUCOSE-CAPILLARY: 123 mg/dL — AB (ref 65–99)
GLUCOSE-CAPILLARY: 136 mg/dL — AB (ref 65–99)
Glucose-Capillary: 139 mg/dL — ABNORMAL HIGH (ref 65–99)
Glucose-Capillary: 162 mg/dL — ABNORMAL HIGH (ref 65–99)

## 2017-06-08 LAB — CBC
HCT: 28.7 % — ABNORMAL LOW (ref 39.0–52.0)
Hemoglobin: 9.1 g/dL — ABNORMAL LOW (ref 13.0–17.0)
MCH: 28 pg (ref 26.0–34.0)
MCHC: 31.7 g/dL (ref 30.0–36.0)
MCV: 88.3 fL (ref 78.0–100.0)
PLATELETS: 312 10*3/uL (ref 150–400)
RBC: 3.25 MIL/uL — ABNORMAL LOW (ref 4.22–5.81)
RDW: 17.6 % — ABNORMAL HIGH (ref 11.5–15.5)
WBC: 13.2 10*3/uL — ABNORMAL HIGH (ref 4.0–10.5)

## 2017-06-08 LAB — COOXEMETRY PANEL
CARBOXYHEMOGLOBIN: 1 % (ref 0.5–1.5)
Methemoglobin: 1 % (ref 0.0–1.5)
O2 SAT: 71.1 %
TOTAL HEMOGLOBIN: 8.1 g/dL — AB (ref 12.0–16.0)

## 2017-06-08 LAB — HEPARIN LEVEL (UNFRACTIONATED): Heparin Unfractionated: 0.42 IU/mL (ref 0.30–0.70)

## 2017-06-08 LAB — BASIC METABOLIC PANEL
ANION GAP: 7 (ref 5–15)
BUN: 75 mg/dL — ABNORMAL HIGH (ref 6–20)
CHLORIDE: 118 mmol/L — AB (ref 101–111)
CO2: 25 mmol/L (ref 22–32)
CREATININE: 1.7 mg/dL — AB (ref 0.61–1.24)
Calcium: 8.2 mg/dL — ABNORMAL LOW (ref 8.9–10.3)
GFR calc non Af Amer: 44 mL/min — ABNORMAL LOW (ref >60)
GFR, EST AFRICAN AMERICAN: 51 mL/min — AB (ref >60)
GLUCOSE: 163 mg/dL — AB (ref 65–99)
Potassium: 4.3 mmol/L (ref 3.5–5.1)
SODIUM: 150 mmol/L — AB (ref 135–145)

## 2017-06-08 LAB — PROTIME-INR
INR: 1.52
PROTHROMBIN TIME: 18.4 s — AB (ref 11.4–15.2)

## 2017-06-08 LAB — LACTATE DEHYDROGENASE: LDH: 137 U/L (ref 98–192)

## 2017-06-08 NOTE — Progress Notes (Signed)
Patient ID: James [name], male   DOB: [birth year], 56 y.o.   MRN: [id number]   Advanced Heart Failure VAD Team Note  Subjective:    Events: --HVAD placed 6/28.  Returned to the OR that evening with high chest tube output, evacuation of mediastinal hematoma.  --Extubated 6/29. Milrinone stopped on 7/4. --7/4, patient developed ileus with respiratory compromise. NGT placed with 3 L suctioned out.  CXR with suspicion for aspiration PNA.  Patient had to be intubated.  He went into atrial fibrillation with RVR.  He became hypotensive and was started on norepinephrine and phenylephrine.  Amiodarone gtt begun.    --Extubated again on 7/6.  -- 7/13 Had 2 sustained episodes of VT. Had to be cardioverted 1. Second episode broke with overdrive pacing with Dr. Caryl Comes. VAD speed turned down to 2700.  --Melena due to acuteGI bleed --> 2 units PRBCs 7/14, 2 units 7/15. 3 U PRBCs 7/16,  05/02/2017 3 UPRBCs.  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  He had epinephrine, APC, and 2 clips. Intubated prior to procedure and placed on norepinephrine. Speed dropped to 2660.  --7/18 Back in VT with overdrive pacing. Lidocaine was started in addition to amiodarone. Extubated. --7/19 lidocaine stopped with confusion. Neuro consulted.  EEG normal.  CT of head no acute findings. 7/20 confusion had resolved.  --1 unit PRBCs 7/20.  --7/22, developed sustained VT with rate around 130 shortly after getting IV Lasix.  We were able to pace him out and back to NSR.  He was put back on amiodarone gtt.  --More VT on 7/25, paced out.  Back on IV amiodarone gtt.  NSR.  -- 7/31 Ramp ECHO decrease speed 2600 rpm.  -- 8/1 VT- paced out -- 8/2 VT - started on sotalol -- 8/6 Hgb 7.5, 1 unit PRBCs -- 8/7 Hgb 9.5, confused.  Head CT with right superior cerebellar artery lacunar infarcts, not acute. -- 8/8 Possible aspiration. Reintubated. -- 8/10 possible seizure => Depakote.  CT head no  change.  1 unit PRBCs.     Multiple suction alarms on 8/17 night which persisted so suction alarm deactivated.   Remains intubated and awake on vent. Only weaned for 1 hour today and then got tired.   Compression wraps placed yesterday. Also started on some free water for hypernatremia despite severe full-body volume overload with 3rd spacing. Co-ox 71%  No VT. CVP remains Sodium 149-> 150->151-> 150  Remains on heparin and vanc/meropenem  HVAD INTERROGATION:  HVAD:  Flow 4.4 liters/min, speed 2540,  power 3.6 W,  Peak 6.6 Trough 2.2. suction alarms off Suction On. Lavare On.     Objective:    Vital Signs:   Temp:  [97.1 F (36.2 C)-98.1 F (36.7 C)] 97.5 F (36.4 C) (08/19 0825) Pulse Rate:  [58-67] 61 (08/19 0800) Resp:  [11-19] 14 (08/19 0800) BP: (90-118)/(71-91) 100/83 (08/19 0821) SpO2:  [100 %] 100 % (08/19 0821) FiO2 (%):  [30 %] 30 % (08/19 0821) Weight:  [108.1 kg (238 lb 5.1 oz)] 108.1 kg (238 lb 5.1 oz) (08/19 0500) Last BM Date: 06/07/17 Mean arterial Pressure 80-90s Intake/Output:   Intake/Output Summary (Last 24 hours) at 06/08/17 0848 Last data filed at 06/08/17 0600  Gross per 24 hour  Intake             1860 ml  Output             2425 ml  Net             -  565 ml     Physical Exam    Physical Exam: GENERAL: Intubated. Awake on vent. Weak appearing HEENT: +ETT. Cor-track. NECK: Supple, JVP 6-7  CARDIAC:  Mechanical heart sounds LVAD hum present  LUNGS:  Coarse on vent  ABDOMEN:  NT, ND , no HSM. No bruits or masses. Hypoactive BSBS  LVAD exit site: Dressing dry and intact. No erythema or drainage. Stabilization device present and accurately applied. Driveline dressing changed daily per sterile technique. EXTREMITIES:  Warm and dry. No cyanosis, clubbing, or rash. LEs and LUE wrapped. Still with anasarca but mildly improved with compression Diffuse gouty changes. Wounds on legs  NEUROLOGIC:  Intubated. Follows commands. Moves all 4s SKIN: Sacrum  with unstageable pressure ulcer. Wounds on LEs   Telemetry   Personally reviewed, NSR with V pacing at 60s. No VT + PVCs  Labs   Basic Metabolic Panel:  Recent Labs Lab 06/05/17 1532 06/06/17 0219 06/06/17 0900 06/06/17 1230 06/07/17 0306 06/08/17 0237  NA 146* 149*  --  150* 151* 150*  K 6.1* 5.5*  --  5.3* 5.0 4.3  CL 115* 116*  --  119* 118* 118*  CO2 24 24  --  26 26 25   GLUCOSE 207* 141*  --  106* 178* 163*  BUN 91* 88*  --  83* 80* 75*  CREATININE 2.24* 2.06*  --  1.95* 1.81* 1.70*  CALCIUM 7.9* 8.0*  --  8.1* 8.2* 8.2*  MG  --   --  1.9  --   --   --     Liver Function Tests:  Recent Labs Lab 06/03/17 1233 06/04/17 0518  AST 62* 52*  ALT 26 23  ALKPHOS 193* 174*  BILITOT 0.8 0.8  PROT 4.9* 5.0*  ALBUMIN 1.8* 1.7*   No results for input(s): LIPASE, AMYLASE in the last 168 hours. No results for input(s): AMMONIA in the last 168 hours.  CBC:  Recent Labs Lab 06/02/17 0321  06/04/17 0518 06/05/17 0434 06/06/17 0219 06/07/17 0306 06/08/17 0237  WBC 22.2*  < > 25.0* 16.1* 13.5* 12.3* 13.2*  NEUTROABS 20.0*  --   --   --   --   --   --   HGB 9.3*  < > 10.2* 9.8* 9.2* 9.4* 9.1*  HCT 28.8*  < > 31.9* 30.6* 28.9* 29.4* 28.7*  MCV 87.8  < > 88.1 89.0 89.8 88.3 88.3  PLT 260  < > 323 312 291 317 312  < > = values in this interval not displayed.  INR:  Recent Labs Lab 06/04/17 0518 06/05/17 0434 06/06/17 0219 06/07/17 0306 06/08/17 0237  INR 1.52 1.40 1.57 1.56 1.52    Other results:     Imaging   Dg Chest Port 1 View  Result Date: 06/08/2017 CLINICAL DATA:  LVAD EXAM: PORTABLE CHEST 1 VIEW COMPARISON:  Five days ago FINDINGS: Partly seen LVAD. There is a single chamber pacer from the left into the right ventricle. There is a feeding tube that could be curved back into the stomach or at the duodenal jejunal junction. Endotracheal tube tip between the clavicular heads and carina. Right upper extremity PICC with tip at the upper right  atrium. Low volume chest with hazy opacity at the left base. No pneumothorax. No pulmonary edema seen. Stable cardiomegaly. IMPRESSION: 1. Stable positioning of tubes and central line. 2. Low volume chest with stable atelectasis and possible small pleural effusion on the left. Electronically Signed   By: Neva Seat.D.  On: 06/08/2017 07:21     Medications:     Scheduled Medications: . amiodarone  200 mg Per Tube BID  . chlorhexidine gluconate (MEDLINE KIT)  15 mL Mouth Rinse BID  . Chlorhexidine Gluconate Cloth  6 each Topical Q0600  . colchicine  0.6 mg Oral Daily  . collagenase   Topical Daily  . febuxostat  120 mg Oral Daily  . feeding supplement (NEPRO CARB STEADY)  1,000 mL Per Tube Q24H  . feeding supplement (PRO-STAT SUGAR FREE 64)  60 mL Per Tube BID  . free water  75 mL Per Tube Q6H  . insulin aspart  0-15 Units Subcutaneous Q4H  . insulin glargine  15 Units Subcutaneous Daily  . levETIRAcetam  500 mg Per Tube BID  . levothyroxine  50 mcg Per NG tube QAC breakfast  . magnesium oxide  400 mg Per Tube Daily  . mouth rinse  15 mL Mouth Rinse 10 times per day  . mexiletine  300 mg Per Tube Q12H  . pantoprazole sodium  40 mg Per Tube BID  . predniSONE  20 mg Per Tube Q breakfast  . sodium chloride flush  10-40 mL Intracatheter Q12H  . sotalol  80 mg Per Tube Daily    Infusions: . heparin 1,600 Units/hr (06/08/17 0813)  . meropenem (MERREM) IV Stopped (06/08/17 0606)  . norepinephrine (LEVOPHED) Adult infusion Stopped (06/04/17 2250)  . sodium chloride Stopped (06/06/17 0335)  . vancomycin Stopped (06/07/17 1705)    PRN Medications: acetaminophen (TYLENOL) oral liquid 160 mg/5 mL, albuterol, docusate, fentaNYL (SUBLIMAZE) injection, hydrALAZINE, neomycin-bacitracin-polymyxin, ondansetron (ZOFRAN) IV, oxyCODONE, sodium chloride flush   Patient Profile   56 yo with CAD s/p CABG, ischemic cardiomyopathy/chronic systolic CHF, tophaceous gout, and CKD stage 3 was  admitted for diuresis and consideration for LVAD placement. S/p HVAD on 6/28  Assessment/Plan:    1. Acute/chronic systolic CHF s/p HVAD: Ischemic cardiomyopathy.  St Jude ICD.  Echo (6/18) with EF 15%, mildly dilated RV with moderately decreased systolic function.  s/p HVAD placement 6/28 and had to return to OR to evacuate mediastinal hematoma.  He had been weaned off pressors/milrinone, but developed ileus w/ likely aspiration event 7/4, re-intubated, developed afib/RVR requiring norepinephrine. Extubated 7/6.  VAD speed turned down to 2600 with VT. VAD parameters currently look good.  Milrinone stopped 7/17 with recurrent VT.  He was started back on norepinephrine 8/9 due to soft BP after intubation and suspected septic shock.  - Off pressors. MAPs 80-90. Continue prn hydral.  - Volume status fine centrally. CVP ok. Main issue is massive 3rd spacing but low intravascular volume. Has gotten numerous units of albumin without improvement. Suction alarm deactivated. Fortunately no VT.  - Extremities wrapped with ACE wraps with some improvement inperipheral edema  - Will progressive hypernatremia will not give diuretics. Continue small free H2O boluses - Off ASA with GI Bleed  - Goal INR 2-2.5. Today's INR 1.52. Covered with heparin.  D/w PharmD 2. AKI on CKD stage 3:  - Creatinine continues to improve - Hyperkalemia resolved 3. Symptomatic anemia due to acute UGI bleeding:  Received 3 units PRBCs 7/16 and 3 units PRBCs on 04/21/2017. 1 unit PRBCs 7/20. 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  Requiring 2 clips + epi + APC. 1 unit PRBCs 8/6.  He had 1 unit PRBCs 8/10 to provide volume.  Hemoglobin stable today.    - Got Feraheme 7/28.  - Continue Protonix 40 po bid. -  Off aspirin for now.  - Transfuse hgb < 8. Hgb 9.1 4. Ventricular tachycardia: Status post ventricular tachycardia 2 on 7/13. Possible suction event. VAD speed turned down to 2700.  Recurrent VT  7/17. EP called to bedside. Overdrive pacing successful for about 10 minutes but VT recurred.  Milrinone turned off, remained in VT overnight but hemodynamically stable.  Speed decreased to 2600.  On 7/18, we were able to pace him out of VT.  Lidocaine added then stopped due to confusion/mental status changes.  He had VT 7/22 about 30 minutes after getting IV Lasix, had to be paced out again => may have been due to rapid fluid shift.  Pt paced out of VT 3 separate occasions 7/25. Got amio bolus x 2 and started back on IV infusion.  Transitioned to amiodarone 400 mg tid.  On 8/1 had recurrent VT and paced out again.  Recurrent VT 8/2 per EP sotalol started along with amiodarone, sotalol decreased to once daily with bradycardia.  - Continue mexiletine to 300 mg BID, sotalol 80 mg daily, amio 200 mg BID. - Remains quiescent despite persistent suction events. Continue to follow closely.  - Keep K > 4.0 Mg > 2.0 5. Paroxysmal Atrial fibrillation: Developed atrial fibrillation with RVR in setting of aspiration PNA on 7/4. - In NSR 6. Malnutrition:  -Continue tube feeds via Cor-track. Nutrition following. 7. Aspiration PNA with acute hypoxemic respiratory failure on 7/4: He was extubated on 7/6. Tracheal aspirate with Klebsiella and citrobacter. Both sensitive to Zosyn. Completed abx 7/13. Possible aspiration 8/8.  - Now on vanco/meropenum.    8. Acute Respiratory Failure: Intubated 7/17 in setting of complex GI intervention. Extubated 7/18. Re-intubated 8/8.  Weak respiratory muscles. He has been making progress with vent wean, weaned for 4 hours yesterday.  Wean again today => he would not want tracheostomy, will continue wean towards extubation as long as he is making progress.  If he stops making progress, will need to discuss comfort care.  - Only weaned for 1 hour today due to progressive weakness. I do not think he has the reserve to tolerate extubation. Refuses trach. Plan is for one-way extubation  tomorrow and I suspect this will be a terminal event. Will need to discuss with family in am (not present currently)  9. CAD: s/p CABG 2012. No chest pain.  Off aspirin due to GI bleeding. No change.   10. Gout: Severe tophaceous gout.  - Continue uloric. No change.  11. Delirium: Resolved. No further, though assessment limited back on Vent.  Appreciate neuro input Possibly related to lidocaine.  Lidocaine stopped. CT of head negative. EEG ok. No further workup per neuro. Neuro  reconsulted 8/7 with AMS => ?related to fever/infection.  He has been more clear on vent.   - CT head 05/27/17 and 8/10 with lacunar infarcts but do not appear acute.   - Seizure activity versus myoclonus => Depakote started.  Switched to [place] with ?drug rash from Depakote.   12. UTI: on vancomycin and meropenum. Urine culture- No growth. .  13. Unstageable Pressure Ulcer: Sacrum. Consult WOC Needs to start santyl daily to enzymatically debride. Reposition R to L. WOC recommendations appreciated.     14. Bradycardia: With hypotension.  Currently back-up pacing set at 60 bpm.  Does not have atrial lead so pacing asynchronously.  Currently, on and off pacing at rate of 60.    15. Hypothyroidism: May be related to amiodarone, he is on Levoxyl. No change.  16. ID: Suspected septic shock, source = pressure ulcer versus UTI.  Afebrile now, WBCs coming down, BP improving.  Continue meropenem/vancomycin. Cultures so far negative.   17. Rash: ?Drug eruption.  Has not worsened and is faint.  Meropenem versus Depakote may be the culprits. - Depakote stopped and switched for Keppra per Neuro recommendation. Rash resolved 18. Deconditioning:   - Extremely severe. I am not sure that he has enough reserve to survive 19. Hyponatremia - Will give gentle free water bolus down tube  I reviewed the HVAD parameters from today, and compared the results to the patient's prior recorded data.  No programming changes were made.  The HVAD is  functioning within specified parameters.    Only weaned for 1 hour today due to progressive weakness. I do not think he has the reserve to tolerate extubation. Refuses trach. Plan is for one-way extubation tomorrow and I suspect this will be a terminal event. Will need to discuss with family in am (not present currently)   CRITICAL CARE Performed by: Glori Bickers  Total critical care time: 35 minutes  Critical care time was exclusive of separately billable procedures and treating other patients.  Critical care was necessary to treat or prevent imminent or life-threatening deterioration.  Critical care was time spent personally by me (independent of midlevel providers or residents) on the following activities: development of treatment plan with patient and/or surrogate as well as nursing, discussions with consultants, evaluation of patient's response to treatment, examination of patient, obtaining history from patient or surrogate, ordering and performing treatments and interventions, ordering and review of laboratory studies, ordering and review of radiographic studies, pulse oximetry and re-evaluation of patient's condition.    Glori Bickers, MD 06/08/2017, 8:48 AM  VAD Team --- VAD ISSUES ONLY--- Pager [phone] (7am - 7am) Advanced Heart Failure Team  Pager [phone] (M-F; 7a - 4p)  Please contact [facility] for night-coverage after hours (4p -7a ) and weekends on [email or web address]

## 2017-06-08 NOTE — Progress Notes (Signed)
PULMONARY / CRITICAL CARE MEDICINE   Name: James [name] MRN: [id number] DOB: 01-12-1961    ADMISSION DATE:  03/31/2017 CONSULTATION DATE:  04/23/2017  REFERRING MD:  Laneta Simmers - CVTS  CHIEF COMPLAINT:  Acute respiratory failure  BRIEF SUMMARY:  56 y.o. male with history of systolic congestive heart failure with EF 15%, coronary artery disease, chronic renal failure, diabetes, and essential hypertension admitted on 04/10/2017 for ventricular assist device placement. Patient previously turned down by [facility] for transplant due to limitations in his mobility and his underlying gout. Course complicated by acute on chronic renal failure. He has had recurrent episodes of supraventricular tachycardia and GI bleeding. Patient was reintubated on 8/8 with concern for aspiration secondary to increased work of breathing and displaced NG tube. PCCM was consulted for help.  SUBJECTIVE: No other acute events overnight  REVIEW OF SYSTEMS:  Unable to obtain as patient is intubated.  VITAL SIGNS: BP 100/83   Pulse 61   Temp (!) 97.5 F (36.4 C) (Axillary)   Resp 14   Ht 6' (1.829 m)   Wt 108.1 kg (238 lb 5.1 oz)   SpO2 100%   BMI 32.32 kg/m   HEMODYNAMICS: CVP:  [4 mmHg-9 mmHg] 6 mmHg  VENTILATOR SETTINGS: Vent Mode: PSV;CPAP FiO2 (%):  [30 %] 30 % Set Rate:  [14 bmp] 14 bmp Vt Set:  [620 mL] 620 mL PEEP:  [5 cmH20] 5 cmH20 Pressure Support:  [10 cmH20] 10 cmH20 Plateau Pressure:  [14 cmH20] 14 cmH20  INTAKE / OUTPUT: I/O last 3 completed shifts: In: 2954.8 [I.V.:654.8; Other:200; NG/GT:1750; IV Piggyback:350] Out: 3510 [Urine:2660; Stool:850]  PHYSICAL EXAMINATION: Gen:      No acute distress HEENT:  EOMI, sclera anicteric, ETT in place Neck:     No masses; no thyromegaly Lungs:    Clear to auscultation bilaterally; normal respiratory effort CV:         LVAD noise. Regular rate and rhythm; no murmurs Abd:      + bowel sounds; soft, non-tender; no palpable masses, no  distension Ext:    2-3 + edema, anasarca; adequate peripheral perfusion Skin:      Warm and dry; no rash Neuro: alert and oriented x 3 Psych: normal mood and affect  LABS:  BMET  Recent Labs Lab 06/06/17 1230 06/07/17 0306 06/08/17 0237  NA 150* 151* 150*  K 5.3* 5.0 4.3  CL 119* 118* 118*  CO2 26 26 25   BUN 83* 80* 75*  CREATININE 1.95* 1.81* 1.70*  GLUCOSE 106* 178* 163*   Electrolytes  Recent Labs Lab 06/06/17 0900 06/06/17 1230 06/07/17 0306 06/08/17 0237  CALCIUM  --  8.1* 8.2* 8.2*  MG 1.9  --   --   --    CBC  Recent Labs Lab 06/06/17 0219 06/07/17 0306 06/08/17 0237  WBC 13.5* 12.3* 13.2*  HGB 9.2* 9.4* 9.1*  HCT 28.9* 29.4* 28.7*  PLT 291 317 312   Coag's  Recent Labs Lab 06/06/17 0219 06/07/17 0306 06/08/17 0237  INR 1.57 1.56 1.52   Sepsis Markers No results for input(s): LATICACIDVEN, PROCALCITON, O2SATVEN in the last 168 hours. ABG No results for input(s): PHART, PCO2ART, PO2ART in the last 168 hours. Liver Enzymes  Recent Labs Lab 06/03/17 1233 06/04/17 0518  AST 62* 52*  ALT 26 23  ALKPHOS 193* 174*  BILITOT 0.8 0.8  ALBUMIN 1.8* 1.7*    Cardiac Enzymes No results for input(s): TROPONINI, PROBNP in the last 168 hours.  Glucose  Recent Labs Lab 06/07/17 1144 06/07/17 1653 06/07/17 1953 06/07/17 2338 06/08/17 0406 06/08/17 0828  GLUCAP 209* 251* 203* 167* 128* 123*    Imaging Dg Chest Port 1 View  Result Date: 06/08/2017 CLINICAL DATA:  LVAD EXAM: PORTABLE CHEST 1 VIEW COMPARISON:  Five days ago FINDINGS: Partly seen LVAD. There is a single chamber pacer from the left into the right ventricle. There is a feeding tube that could be curved back into the stomach or at the duodenal jejunal junction. Endotracheal tube tip between the clavicular heads and carina. Right upper extremity PICC with tip at the upper right atrium. Low volume chest with hazy opacity at the left base. No pneumothorax. No pulmonary edema seen.  Stable cardiomegaly. IMPRESSION: 1. Stable positioning of tubes and central line. 2. Low volume chest with stable atelectasis and possible small pleural effusion on the left. Electronically Signed   By: Marnee Spring M.D.   On: 06/08/2017 07:21     IMAGING/STUDIES: CT HEAD W/O 8/10: IMPRESSION: 1. No acute intracranial process. 2. 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 PORT CXR 8/11:  Previously reviewed by me. No focal opacity. Right upper extremity central venous catheter in place. Endotracheal tube in good positioning. Enteric feeding tube coursing below diaphragm. PORT CXR 8/14:  Previously reviewed by me. LVAD in place. No new focal opacity. No pleural effusion appreciated. Endotracheal tube and central venous catheter in good position. Enteric feeding tube coursing below diaphragm. Persistent silhouetting of left hemidiaphragm and lower lung opacity.  MICROBIOLOGY: MRSA PCR 6/22:  Positive Blood Cultures x2 7/5:  Negative  Tracheal Aspirate Culture 7/5:  Citrobacter koseri & Klebsiella pneumoniae  Tracheal Aspirate Culture 7/7:  Klebsiella pneumoniae MRSA PCR 7/26:  Negative  Urine Culture 8/7:  Multiple Species  Blood Cultures x3 8/7:  Negative  Tracheal Aspirate Culture 8/8:  Multiple Organisms Present Urine Culture 8/9:  Negative Blood Cultures x2 8/11 >>> Tracheal Aspirate Culture 8/11:  Negative  Blood Cultures x2 8/12 >>>  ANTIBIOTICS: Zinacef 6/28 (periop) Zosyn 7/5 - 7/13 Vancomycin 7/5 - 7/7; 8/8 >>> Merrem 8/8 >>>  SIGNIFICANT EVENTS: 06/22 - Admit  06/28 - Implanting of LVAD 07/05 - Respiratory arrest post feculent vomiting episode 07/06 - Extubated 07/17 - Intubated during EGD 07/18 - Extubated 08/08 - Respiratory distress & intubated. Lasix x2 doses given. 08/13 - Worsened renal function w/ Lasix w/o significant diuresis. Weaned on PS some.  08/14 - Lasix given by  Cardiology 08/15 - Weaned off Levophed 08/16 - Tolerated PS 8/5 for the majority of the day. Tube feedings stopped as possible contributor to hyperkalemia.  LINES/TUBES: OETT 6/28 - 6/29 ; 7/5 - 7/6; 04/27/12/2018; 8/8 >>> RUE TL PICC 7/2 >>> R NGT >>> FOLEY >>> RECTAL TUBE >>>  ASSESSMENT / PLAN:   56 y.o. male status post LVAD placement. Continuing daily pressure support wean. Continuing to plan for one-way extubation on 8/22.  PULMONARY A: Acute, Recurrent Hypoxic Respiratory Failure: Patient and family would not want tracheostomy. Aspiration Pneumonia:  Secondary to tube feedings.  P:   Continue daily PSV means Continue vent support over the weekend Noted plans for one way extubation next week  CARDIOVASCULAR A:  Acute on chronic systolic congestive heart failure: Turned down for heart transplant. Status post LVAD. Atrial fibrillation Recurrent ventricular tachycardia Mediastinal hematoma: Status post evacuation post-LVAD. H/O essential hypertension  P:  Continue ICU monitoring Management per cardiology and CVTS  RENAL A:   Acute  on chronic renal failure stage III: Stable. Hyperkalemia: Improving s/p kayexelate Hypernatremia: Oscillating.  P:   Follow K, urine outout Diuresis as per primary service  GASTROINTESTINAL A:   Severe protein-calorie malnutrition Chronic mesenteric ischemia  P:   Tube feeds  HEMATOLOGIC A:   Anemia:  Hemoglobin stable. Multifactorial with contribution from mediastinal hematoma. No evidence of active bleeding. Leukocytosis: Improving. Coagulopathy: Secondary to systemic and coagulation.   P:  Follow CBC  INFECTIOUS A:   Aspiration pneumonia FUO: Cultures repeatedly negative.  P:   Continue broad abx coverage Culture if he spikes a temp  ENDOCRINE A:   Diabetes Mellitus Type 2:  Glucose better controlled today. Hypothyroidism    P:   On synthyroid Lantus SSI   NEUROLOGIC/MUSCULOSKELETAL A:   Sedation on  ventilator Gout  P:   Desired RASS:  0 to -1 AED:  Keppra via tube BID. Continuing Colchicine (started 8/13) Continuing Prednisone 20mg  daily  Fentanyl IV prn pain Continuing Oxycodone prn pain  Prophylaxis: Protonix, heparin drip Diet: Tube feeds Code Status:  Partial Code as per previous physician discussions. Disposition:  Remains critically ill in the ICU. Plan for one-way extubation when family ready for at 2 week mark (8/22). Family Update:  No family at bedside 8/18  The patient is critically ill with multiple organ system failure and requires high complexity decision making for assessment and support, frequent evaluation and titration of therapies, advanced monitoring, review of radiographic studies and interpretation of complex data.   Critical Care Time devoted to patient care services, exclusive of separately billable procedures, described in this note is 35 minutes.   Chilton Greathouse MD [facility] Pager [phone] If no answer or after 3pm call: [phone] 06/08/2017, 8:49 AM

## 2017-06-08 NOTE — Progress Notes (Signed)
ANTICOAGULATION CONSULT NOTE - Follow Up Consult  Pharmacy Consult for Heparin  Indication: HVAD  No Known Allergies  Patient Measurements: Height: 6' (182.9 cm) Weight: 238 lb 5.1 oz (108.1 kg) IBW/kg (Calculated) : 77.6  Vital Signs: Temp: 97.9 F (36.6 C) (08/19 0410) Temp Source: Oral (08/19 0410) BP: 100/83 (08/19 0800) Pulse Rate: 61 (08/19 0800)  Labs:  Recent Labs  06/06/17 0219 06/06/17 1230  06/07/17 0306 06/07/17 1333 06/08/17 0237  HGB 9.2*  --   --  9.4*  --  9.1*  HCT 28.9*  --   --  29.4*  --  28.7*  PLT 291  --   --  317  --  312  LABPROT 19.0*  --   --  18.8*  --  18.4*  INR 1.57  --   --  1.56  --  1.52  HEPARINUNFRC 1.01* 1.04*  < > 0.51 0.47 0.42  CREATININE 2.06* 1.95*  --  1.81*  --  1.70*  < > = values in this interval not displayed.  Estimated Creatinine Clearance: 63.1 mL/min (A) (by C-G formula based on SCr of 1.7 mg/dL (H)).  Assessment: 56 y/o M continues on heparin s/p HVAD on 04/10/2017.  No overt bleeding noted, CBC stable   AM heparin level 0.42, within goal range.  Goal of Therapy:  Heparin level 0.3-0.5 units/mL Monitor platelets by anticoagulation protocol: Yes   Plan:  Continue heparin drip 1600 uts/hr Daily heparin level and CBC. F/u ability to take orals, restart Coumadin when possible.  Tad Moore, BCPS  Clinical Pharmacist Pager [phone]  06/08/2017 8:19 AM

## 2017-06-09 LAB — HEPARIN LEVEL (UNFRACTIONATED): Heparin Unfractionated: 0.39 IU/mL (ref 0.30–0.70)

## 2017-06-09 LAB — COOXEMETRY PANEL
Carboxyhemoglobin: 1 % (ref 0.5–1.5)
Methemoglobin: 1.4 % (ref 0.0–1.5)
O2 Saturation: 73.2 %
Total hemoglobin: 7.8 g/dL — ABNORMAL LOW (ref 12.0–16.0)

## 2017-06-09 LAB — GLUCOSE, CAPILLARY
GLUCOSE-CAPILLARY: 154 mg/dL — AB (ref 65–99)
GLUCOSE-CAPILLARY: 175 mg/dL — AB (ref 65–99)
GLUCOSE-CAPILLARY: 92 mg/dL (ref 65–99)
Glucose-Capillary: 99 mg/dL (ref 65–99)
Glucose-Capillary: 170 mg/dL — ABNORMAL HIGH (ref 65–99)

## 2017-06-09 LAB — CBC
HEMATOCRIT: 26.7 % — AB (ref 39.0–52.0)
HEMOGLOBIN: 8.4 g/dL — AB (ref 13.0–17.0)
MCH: 28 pg (ref 26.0–34.0)
MCHC: 31.5 g/dL (ref 30.0–36.0)
MCV: 89 fL (ref 78.0–100.0)
Platelets: 307 10*3/uL (ref 150–400)
RBC: 3 MIL/uL — ABNORMAL LOW (ref 4.22–5.81)
RDW: 18 % — ABNORMAL HIGH (ref 11.5–15.5)
WBC: 12.1 10*3/uL — ABNORMAL HIGH (ref 4.0–10.5)

## 2017-06-09 LAB — BASIC METABOLIC PANEL
Anion gap: 3 — ABNORMAL LOW (ref 5–15)
BUN: 71 mg/dL — AB (ref 6–20)
CHLORIDE: 123 mmol/L — AB (ref 101–111)
CO2: 27 mmol/L (ref 22–32)
CREATININE: 1.51 mg/dL — AB (ref 0.61–1.24)
Calcium: 8.1 mg/dL — ABNORMAL LOW (ref 8.9–10.3)
GFR calc Af Amer: 59 mL/min — ABNORMAL LOW (ref >60)
GFR calc non Af Amer: 51 mL/min — ABNORMAL LOW (ref >60)
GLUCOSE: 111 mg/dL — AB (ref 65–99)
Potassium: 4.1 mmol/L (ref 3.5–5.1)
SODIUM: 153 mmol/L — AB (ref 135–145)

## 2017-06-09 LAB — MAGNESIUM: Magnesium: 2.2 mg/dL (ref 1.7–2.4)

## 2017-06-09 LAB — PROTIME-INR
INR: 1.52
Prothrombin Time: 18.4 seconds — ABNORMAL HIGH (ref 11.4–15.2)

## 2017-06-09 LAB — LACTATE DEHYDROGENASE: LDH: 127 U/L (ref 98–192)

## 2017-06-09 MED ORDER — INSULIN GLARGINE 100 UNIT/ML ~~LOC~~ SOLN
10.0000 [IU] | Freq: Every day | SUBCUTANEOUS | Status: DC
  Administered 2017-06-09 – 2017-06-10 (×2): 10 [IU] via SUBCUTANEOUS
  Filled 2017-06-09 (×2): qty 0.1

## 2017-06-09 MED ORDER — FREE WATER
100.0000 mL | Freq: Four times a day (QID) | Status: DC
  Administered 2017-06-09 – 2017-06-11 (×8): 100 mL

## 2017-06-09 MED ORDER — GERHARDT'S BUTT CREAM
TOPICAL_CREAM | CUTANEOUS | Status: DC | PRN
  Administered 2017-06-16: 10:00:00 via TOPICAL
  Filled 2017-06-09 (×2): qty 1

## 2017-06-09 MED ORDER — HYDRALAZINE HCL 25 MG PO TABS
25.0000 mg | ORAL_TABLET | Freq: Three times a day (TID) | ORAL | Status: DC
  Administered 2017-06-09 – 2017-06-10 (×4): 25 mg via ORAL
  Filled 2017-06-09 (×4): qty 1

## 2017-06-09 NOTE — Progress Notes (Signed)
Patient ID: James [name], male   DOB: 1960/10/23, 56 y.o.   MRN: [id number] HVAD Rounding Note  Subjective:    Remains intubated on vent. Did not tolerate much weaning yesterday. Says he was tired.  Hemodynamics stable with CVP 7 Co-ox 73%  Weight down 10 lbs over the past three days with wrapping extremities.  LVAD INTERROGATION:  HVAD:  Flow 3.8 liters/min, speed 2540, power 3.3  Objective:    Vital Signs:   Temp:  [97.8 F (36.6 C)-98.3 F (36.8 C)] 98 F (36.7 C) (08/20 0800) Pulse Rate:  [59-73] 67 (08/20 1200) Resp:  [0-37] 20 (08/20 1200) BP: (91-117)/(70-98) 99/70 (08/20 1200) SpO2:  [98 %-100 %] 100 % (08/20 1200) FiO2 (%):  [30 %] 30 % (08/20 1157) Weight:  [106.9 kg (235 lb 10.8 oz)] 106.9 kg (235 lb 10.8 oz) (08/20 0400) Last BM Date: 06/08/17 Mean arterial Pressure 80-100  Intake/Output:   Intake/Output Summary (Last 24 hours) at 06/09/17 1235 Last data filed at 06/09/17 1200  Gross per 24 hour  Intake             2023 ml  Output             2010 ml  Net               13 ml     Physical Exam: General:  Looks tired. Intubated Cor: distant heart sounds with LVAD hum present. Lungs: clear Abdomen: soft, nontender, nondistended. No hepatosplenomegaly. No bruits or masses. Good bowel sounds. Extremities: wrapped with decrease in edema. Neuro: Intubated but follows commands.  Telemetry: sinus with V-pacing at 60.  Labs: Basic Metabolic Panel:  Recent Labs Lab 06/06/17 0219 06/06/17 0900 06/06/17 1230 06/07/17 0306 06/08/17 0237 06/09/17 0317  NA 149*  --  150* 151* 150* 153*  K 5.5*  --  5.3* 5.0 4.3 4.1  CL 116*  --  119* 118* 118* 123*  CO2 24  --  _0 GLUCOSE 141*  --  106* 178* 163* 111*  BUN 88*  --  83* 80* 75* 71*  CREATININE 2.06*  --  1.95* 1.81* 1.70* 1.51*  CALCIUM 8.0*  --  8.1* 8.2* 8.2* 8.1*  MG  --  1.9  --   --   --  2.2    Liver Function Tests:  Recent Labs Lab 06/03/17 1233 06/04/17 0518  AST 62* 52*  ALT  26 23  ALKPHOS 193* 174*  BILITOT 0.8 0.8  PROT 4.9* 5.0*  ALBUMIN 1.8* 1.7*   No results for input(s): LIPASE, AMYLASE in the last 168 hours. No results for input(s): AMMONIA in the last 168 hours.  CBC:  Recent Labs Lab 06/05/17 0434 06/06/17 0219 06/07/17 0306 06/08/17 0237 06/09/17 0317  WBC 16.1* 13.5* 12.3* 13.2* 12.1*  HGB 9.8* 9.2* 9.4* 9.1* 8.4*  HCT 30.6* 28.9* 29.4* 28.7* 26.7*  MCV 89.0 89.8 88.3 88.3 89.0  PLT 312 291 317 312 307    INR:  Recent Labs Lab 06/05/17 0434 06/06/17 0219 06/07/17 0306 06/08/17 0237 06/09/17 0317  INR 1.40 1.57 1.56 1.52 1.52    Other results:  EKG:   Imaging: Dg Chest Port 1 View  Result Date: 06/08/2017 CLINICAL DATA:  LVAD EXAM: PORTABLE CHEST 1 VIEW COMPARISON:  Five days ago FINDINGS: Partly seen LVAD. There is a single chamber pacer from the left into the right ventricle. There is a feeding tube that could be curved back into the stomach or  at the duodenal jejunal junction. Endotracheal tube tip between the clavicular heads and carina. Right upper extremity PICC with tip at the upper right atrium. Low volume chest with hazy opacity at the left base. No pneumothorax. No pulmonary edema seen. Stable cardiomegaly. IMPRESSION: 1. Stable positioning of tubes and central line. 2. Low volume chest with stable atelectasis and possible small pleural effusion on the left. Electronically Signed   By: Monte Fantasia M.D.   On: 06/08/2017 07:21      Medications:     Scheduled Medications: . amiodarone  200 mg Per Tube BID  . chlorhexidine gluconate (MEDLINE KIT)  15 mL Mouth Rinse BID  . Chlorhexidine Gluconate Cloth  6 each Topical Q0600  . colchicine  0.6 mg Oral Daily  . collagenase   Topical Daily  . febuxostat  120 mg Oral Daily  . feeding supplement (NEPRO CARB STEADY)  1,000 mL Per Tube Q24H  . feeding supplement (PRO-STAT SUGAR FREE 64)  60 mL Per Tube BID  . free water  100 mL Per Tube Q6H  . hydrALAZINE  25 mg  Oral Q8H  . insulin aspart  0-15 Units Subcutaneous Q4H  . insulin glargine  10 Units Subcutaneous Daily  . levETIRAcetam  500 mg Per Tube BID  . levothyroxine  50 mcg Per NG tube QAC breakfast  . magnesium oxide  400 mg Per Tube Daily  . mouth rinse  15 mL Mouth Rinse 10 times per day  . mexiletine  300 mg Per Tube Q12H  . pantoprazole sodium  40 mg Per Tube BID  . predniSONE  20 mg Per Tube Q breakfast  . sodium chloride flush  10-40 mL Intracatheter Q12H  . sotalol  80 mg Per Tube Daily     Infusions: . heparin 1,600 Units/hr (06/09/17 1200)  . meropenem (MERREM) IV Stopped (06/09/17 0539)  . norepinephrine (LEVOPHED) Adult infusion Stopped (06/04/17 2250)  . sodium chloride Stopped (06/06/17 0335)  . vancomycin Stopped (06/08/17 1600)     PRN Medications:  acetaminophen (TYLENOL) oral liquid 160 mg/5 mL, albuterol, docusate, fentaNYL (SUBLIMAZE) injection, Gerhardt's butt cream, hydrALAZINE, neomycin-bacitracin-polymyxin, ondansetron (ZOFRAN) IV, oxyCODONE, sodium chloride flush   Assessment/Plan/Discussion:    1. POD 55s/p HVAD for ischemic cardiomyopathy with acute on chronic systolic heart failure, EF 15% with moderate RV dysfunction. Returned to OR for bleeding from raw mediastinal tissues. His VAD parameters have been stable. Had suction alarms overnight Friday/sat so alarms off.  2. Acute mental status changes of unclear etiology. This could be related to infection, medications, hypothyroidism, possibly stroke not see on CT. CT head from 8/7 showed lacunar infarcts that did not appear acute. Seen by neurology. EEG did not show seizure activity. Treated infection and mental status improved.  3. Acute respiratory failure: reintubated for airway protection. He has been weaning on vent but did not tolerate weaning long yesterday. He does not want a trach. Pulmonary aiming toward extubation Wednesday.  4. Acute on chronic kidney failure. Creat down to 1.5. Urine  output ok.   5. GI bleeding and anemia: does not appear to be bleeding at this time.   6. VT: None since Sotalol started in addition to mexiletine and amiodarone.   7. Fever and Leukocytosis. Nothing cultured. He has only had low grade 99 temp the past week and WBC count down to 12.1. On Vanc and meropenum.  8. Sacral pressure sore: Continue present treatment.  9.   Nutrition: tolerating tube feeds at goal.   10.  Hypernatremia:receiving free water boluses.  11. Anticoagulation: on heparin 1600 units per hour with INR 1.52. Holding on further coumadin until we are sure about trach although patient and wife have said no to trach.  I reviewed the LVAD parameters from today, and compared the results to the patient's prior recorded data. No programming changes were made. The LVAD is functioning within specified parameters. LVAD interrogation was negative for any significant power changes, alarms or PI events/speed drops. LVAD equipment check completed andis in good working order. Back-up equipment present.     Length of Stay: [place] 06/09/2017, 12:35 PM

## 2017-06-09 NOTE — Progress Notes (Signed)
LVAD Inpatient Coordinator Rounding Note:  Admitted 04/05/2017 due to A/C Heart failure.   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 Laneta Simmers as DT VAD.  Transferred to 4E 05/17/17.  Transferred back to ICU 2H09 on 05/28/17 with possible aspiration pneumonia requiring re-intubation.  Pt is currently on SBT tolerating well this morning.   Vital signs: Temp: 98 HR: 61 Doppler: 100 Auto BP: 103/88 (94) O2 Sat: 99% on 30% FIO2 and 5 peep Wt in lbs: Marland Kitchen.. 230>229>226>224>235>221>217>223>224>224>234>240>245>235  LVAD interrogation reveals:  Speed: 2540 Flow: 3.7 Power: 3.5  w Alarms: suction alarm now OFF  Peak:  6.8 Trough:  2 HCT: 26.7 Low flow alarm setting: 2 High watt alarm setting: 5.5  Suction: OFF  Lavare cycle: on  Blood Products: 6/28> 5 PRBC's, 6 FFP 7/1> 1 PRBC 7/9> 1 PRBC 7/13>3 PRBC 7/14>3 PRBC 7/15> 1 PRBC 7/16>3PRBC 7/17> 3 PRBC 7/20> 1 PRBC 8/6> 1 PRBC 8/10 > 1 PRBC  Gtts: Milrinone restarted 7/8, stopped 7/16 Levo stopped 04/26/17 restarted May 10, 2017 - stopped 05/07/17 Amiodarone stopped 04/29/17, restarted 7/17 - transitioned to PO 05/13/17; VT on 05/15/17 - IV amio re-started - 30 mg/hr stopped 05/16/17 Heparin 2200 u/hr Lidocaine 05/07/17 - stopped 7/19  TPN - started 04/24/17 for nutritional support, stopped 04/30/17  NG inserted with tube feeding started 05/27/17; placed on hold 05/28/17   Cortrak inserted 06/07/17 - started TF 05/1917   Neuro: 05/27/17- Neurology consult for lethargy, mouth tremors, not vocal, not following commands> Head CT and EEG 05/28/17 - Continual EEG monitoring  05/30/17 - started Depakote for seizure activity - stopped 06/03/17 for rash - will start Keppra   Arrhythmia: 04/24/17 - Afib with RVR - started amiodarone 05/02/17 - 2 sustained episodes of VT - cardioverted x 1; second broke with overdrive pacing 7/17- wide complex tachycardia- Amio bolus x3 and gtt at 60 mg/hr 05/07/17 - sustained VT - converted with overdrive  pacing; Lidocaine started in addition to amiodarone 05/08/17 - Lidocaine stopped due to confusion (lido level 12.4) 05/14/17 - sustained VT - converted 3 separate occassions with overdrive pacing 05/19/17 - sustained VT-converted with overdrive pacing  05/26/17- Afib rate in the 50's 05/27/17 - NSR   Respiratory: 04/23/17 - re-intubated due to respiratory failure secondary to suspected aspiration pneumonia 04/25/17-extubated 05/10/17- intubated for EGD 10-May-2017- extubated 05/28/17 - re-intubated for suspected aspiration pneumonia  Drive Line: Daily Dressing Kits with Aquacell AG silver strips per protocol - will advance to every other day dressing changes.  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 He had epinephrine, APC,    and 2 clips.  - 7/19 - Lidocaine gtt stopped due to confusion (lido toxicity) -8/7- Neurology consult- lethargy, mouth tremors, not vocal, not following commands> Head CT and EEG, blood and urine    cultures sent, Ammonia-20 8/8 - re-intubated for possible aspiration pneumonia   Plan/Recommendations:  1. Currently every other day dressing changes;  Wife perficient in performing dressing changes.  2. Will continue to support pt and wife emotionally during this time.  3. Please call VAD pager with patient and equipment concerns.   Carlton Adam RN, BSN VAD Coordinator  24/7  pager [phone]

## 2017-06-09 NOTE — Progress Notes (Signed)
PULMONARY / CRITICAL CARE MEDICINE   Name: James [name] MRN: [id number] DOB: 30-Apr-1961    ADMISSION DATE:  03/21/2017 CONSULTATION DATE:  04/23/2017  REFERRING MD:  Laneta Simmers - CVTS  CHIEF COMPLAINT:  Acute respiratory failure  BRIEF SUMMARY:  56 y.o. male with history of systolic congestive heart failure with EF 15%, coronary artery disease, chronic renal failure, diabetes, and essential hypertension admitted on 04/10/2017 for ventricular assist device placement. Patient previously turned down by [facility] for transplant due to limitations in his mobility and his underlying gout. Course complicated by acute on chronic renal failure. He has had recurrent episodes of supraventricular tachycardia and GI bleeding. Patient was reintubated on 8/8 with concern for aspiration secondary to increased work of breathing and displaced NG tube. PCCM was consulted for help.  SUBJECTIVE:  Patient had limited tolerance of pressure support trial yesterday. Patient signifies that joint pain may be mildly improved. No nausea or chest discomfort otherwise.  REVIEW OF SYSTEMS:  Unable to obtain as patient is intubated.  VITAL SIGNS: BP 103/88   Pulse 61   Temp 97.9 F (36.6 C) (Oral)   Resp (!) 0   Ht 6' (1.829 m)   Wt 235 lb 10.8 oz (106.9 kg)   SpO2 99%   BMI 31.96 kg/m   HEMODYNAMICS: CVP:  [5 mmHg-15 mmHg] 7 mmHg  VENTILATOR SETTINGS: Vent Mode: PSV;CPAP FiO2 (%):  [30 %] 30 % Set Rate:  [14 bmp] 14 bmp Vt Set:  [620 mL] 620 mL PEEP:  [5 cmH20] 5 cmH20 Pressure Support:  [8 cmH20] 8 cmH20 Plateau Pressure:  [14 cmH20-18 cmH20] 14 cmH20  INTAKE / OUTPUT: I/O last 3 completed shifts: In: 3129 [I.V.:564; NG/GT:2065; IV Piggyback:500] Out: 3435 [Urine:2485; Emesis/NG output:100; Stool:850]  PHYSICAL EXAMINATION: General:  Awake. No acute distress. Wife at bedside.  Integument:  Warm & dry. No rash on exposed skin.  HEENT:  Endotracheal tube in place. No scleral  icterus. Cardiovascular:  Regular rate. Anasarca. Unable to appreciate JVD.  Pulmonary:  Slightly diminished breath sounds in the bases. Normal work of breathing on pressure support 8/5. Abdomen: Soft. Normal bowel sounds. Nondistended. Grossly nontender. Musculoskeletal:  Deformed joints noted. No joint effusion appreciated. Neurological: Patient attends to voice. Nods to questions. Grossly nonfocal.  LABS:  BMET  Recent Labs Lab 06/07/17 0306 06/08/17 0237 06/09/17 0317  NA 151* 150* 153*  K 5.0 4.3 4.1  CL 118* 118* 123*  CO2 26 25 27   BUN 80* 75* 71*  CREATININE 1.81* 1.70* 1.51*  GLUCOSE 178* 163* 111*   Electrolytes  Recent Labs Lab 06/06/17 0900  06/07/17 0306 06/08/17 0237 06/09/17 0317  CALCIUM  --   < > 8.2* 8.2* 8.1*  MG 1.9  --   --   --  2.2  < > = values in this interval not displayed. CBC  Recent Labs Lab 06/07/17 0306 06/08/17 0237 06/09/17 0317  WBC 12.3* 13.2* 12.1*  HGB 9.4* 9.1* 8.4*  HCT 29.4* 28.7* 26.7*  PLT 317 312 307   Coag's  Recent Labs Lab 06/07/17 0306 06/08/17 0237 06/09/17 0317  INR 1.56 1.52 1.52   Sepsis Markers No results for input(s): LATICACIDVEN, PROCALCITON, O2SATVEN in the last 168 hours. ABG No results for input(s): PHART, PCO2ART, PO2ART in the last 168 hours. Liver Enzymes  Recent Labs Lab 06/03/17 1233 06/04/17 0518  AST 62* 52*  ALT 26 23  ALKPHOS 193* 174*  BILITOT 0.8 0.8  ALBUMIN 1.8* 1.7*  Cardiac Enzymes No results for input(s): TROPONINI, PROBNP in the last 168 hours.  Glucose  Recent Labs Lab 06/08/17 1159 06/08/17 1606 06/08/17 2106 06/09/17 0000 06/09/17 0402 06/09/17 0758  GLUCAP 139* 136* 162* 154* 92 99    Imaging No results found.   IMAGING/STUDIES: CT HEAD W/O 8/10: IMPRESSION: 1. No acute intracranial process. 2. 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 PORT CXR 8/11:  Previously  reviewed by me. No focal opacity. Right upper extremity central venous catheter in place. Endotracheal tube in good positioning. Enteric feeding tube coursing below diaphragm. PORT CXR 8/14:  Previously reviewed by me. LVAD in place. No new focal opacity. No pleural effusion appreciated. Endotracheal tube and central venous catheter in good position. Enteric feeding tube coursing below diaphragm. Persistent silhouetting of left hemidiaphragm and lower lung opacity.  MICROBIOLOGY: MRSA PCR 6/22:  Positive Blood Cultures x2 7/5:  Negative  Tracheal Aspirate Culture 7/5:  Citrobacter koseri & Klebsiella pneumoniae  Tracheal Aspirate Culture 7/7:  Klebsiella pneumoniae MRSA PCR 7/26:  Negative  Urine Culture 8/7:  Multiple Species  Blood Cultures x3 8/7:  Negative  Tracheal Aspirate Culture 8/8:  Multiple Organisms Present Urine Culture 8/9:  Negative Blood Cultures x2 8/11:  Negative  Tracheal Aspirate Culture 8/11:  Negative  Blood Cultures x2 8/12:  Negative   ANTIBIOTICS: Zinacef 6/28 (periop) Zosyn 7/5 - 7/13 Vancomycin 7/5 - 7/7; 8/8 >>> Merrem 8/8 >>>  SIGNIFICANT EVENTS: 06/22 - Admit  06/28 - Implanting of LVAD 07/05 - Respiratory arrest post feculent vomiting episode 07/06 - Extubated 07/17 - Intubated during EGD 07/18 - Extubated 08/08 - Respiratory distress & intubated. Lasix x2 doses given. 08/13 - Worsened renal function w/ Lasix w/o significant diuresis. Weaned on PS some.  08/14 - Lasix given by Cardiology 08/15 - Weaned off Levophed 08/16 - Tolerated PS 8/5 for the majority of the day. Tube feedings stopped as possible contributor to hyperkalemia. 08/20 - Requiring day of recovery post PS weaning. Hypernatremia slightly worse today.  LINES/TUBES: OETT 6/28 - 6/29 ; 7/5 - 7/6; 7/17-7/18; 8/8 >>> RUE TL PICC 7/2 >>> R NGT >>> FOLEY >>> RECTAL TUBE >>>  ASSESSMENT / PLAN:   56 y.o. male status post LVAD placement. Continuing daily pressure support wean.  Continuing to plan for one-way extubation on 8/22.  PULMONARY A: Acute, Recurrent Hypoxic Respiratory Failure: Patient and family would not want tracheostomy. Aspiration Pneumonia:  Secondary to tube feedings.  P:   Continuing daily pressure support weaning Planning for one-way extubation on Wednesday Continuous pulse oximetry monitoring  CARDIOVASCULAR A:  Acute on chronic systolic congestive heart failure: Turned down for heart transplant. Status post LVAD. Atrial fibrillation Recurrent ventricular tachycardia Mediastinal hematoma: Status post evacuation post-LVAD. H/O essential hypertension  P:  Management per cardiology and thoracic surgery Vitals per unit protocol Continuous telemetry monitoring  RENAL A:   Acute on chronic renal failure stage III: Stable. Hyperkalemia: Resolved. Hypernatremia/Hyperchloremia:  Slowly worsening.   P:   Continuing to trend renal function and electrolytes daily Monitoring urine output with Foley Continuing free water 100 mL via tube every 6 hour Diuresis as per primary service  GASTROINTESTINAL A:   Severe protein-calorie malnutrition Chronic mesenteric ischemia  P:   NPO  HEMATOLOGIC A:   Anemia:  Hemoglobin stable. Multifactorial with contribution from mediastinal hematoma. No evidence of active bleeding. Leukocytosis: Improving. Coagulopathy: Secondary to systemic and coagulation.   P:  Trending cell counts daily with  CBC while on systemic anticoagulation  INFECTIOUS A:   Aspiration pneumonia FUO: Cultures repeatedly negative.  P:   Continuing treatment with meropenem as above Plan to reculture for fever  ENDOCRINE A:   Diabetes Mellitus Type 2:  Glucose too tightly controlled. Hypothyroidism    P:   Continuing Synthroid Decreasing dose of Lantus to 10 units Continuing Accu-Cheks every 4 hours Continuing sliding scale insulin per moderate algorithm   NEUROLOGIC/MUSCULOSKELETAL A:   Sedation on  ventilator Gout  P:   Desired RASS:  0 to -1 AED:  Keppra via tube BID. Continuing Colchicine (started 8/13) Continuing Prednisone 20mg  daily  Fentanyl IV prn pain Continuing Oxycodone prn pain  Prophylaxis: Heparin drip per protocol & Protonix via tube BID. Diet:  NPO. Continuing tube feedings for now. Code Status:  Partial Code as per previous physician discussions. Disposition:   planning for one-way extubation on 8/22 per my discussion with wife today. Family Update:  Wife updated at bedside during rounds 8/20.   DISCUSSION:  56 y.o. male with acute hypoxic respiratory failure and profound respiratory muscle weakness. Suspect patient requiring day of recovery after pressure support weaning. Continuing to plan for one-way extubation on Wednesday to optimize the patient as much as possible allowing for family time.  I have spent a total of 34 minutes of critical care time today caring for the patient, discussing plan of care with wife at bedside, and reviewing the patient's electronic medical record.   Remainder of care as per primary service and other consultants.  Donna Christen Jamison Neighbor, M.D. [facility] Pulmonary [facility] Critical Care Pager:  [phone] After 3pm or if no response, call [phone] 06/09/2017, 9:24 AM

## 2017-06-09 NOTE — Progress Notes (Signed)
Physical Therapy Treatment Patient Details Name: James [name] MRN: [id number] DOB: [birth year] Today's Date: 06/09/2017    History of Present Illness  Pt adm with acute on chronic heart failure and underwent Heartware HVAD implant on 6/28. Returned to OR later that day for evacuation of mediastinal hematoma. On 7/4 developed ileus with likely aspiration and intubated 7/4-7/6.   Pt developed Heme + stools with progressive anemia as well as  [name] 05/02/17.  He underwent EGD with showed suspected AVM May 07, 2017. He was intubated for procedure and extubated 05/08/17.  Has had setbacks since that time and is intubated on vent.   PMH - gout, DMII, HTN, CKD, CAD S/P CABGx6 2012, chronic systolic heart failure and [employer] jude ICD.     PT Comments    Pt admitted with above diagnosis. Pt currently with functional limitations due to balance and endurance deficits as well as limitations in getting OOB due to multiple episodes of VTach and suction events.  Was able to participate in exercise of all 4 extremities today.  Pt very aware today of surroundings.  Did not meet goals set originally therefore goals revised today.  Will continue to follow pt as able.   Pt will benefit from skilled PT to increase their independence and safety with mobility to allow discharge to the venue listed below.     Follow Up Recommendations  SNF;Supervision/Assistance - 24 hour     Equipment Recommendations  Other (comment) (TBD next venue)    Recommendations for Other Services       Precautions / Restrictions Precautions Precautions: Sternal Precaution Comments: Heartware HVAD Restrictions Weight Bearing Restrictions: No    Mobility  Bed Mobility                  Transfers                    Ambulation/Gait                 Stairs            Wheelchair Mobility    Modified Rankin (Stroke Patients Only)       Balance                                             Cognition Arousal/Alertness: Awake/alert Behavior During Therapy: WFL for tasks assessed/performed;Flat affect Overall Cognitive Status: Difficult to assess Area of Impairment: Following commands;Awareness;Problem solving                   Current Attention Level: Focused Memory: Decreased recall of precautions Following Commands: Follows one step commands with increased time;Follows one step commands consistently Safety/Judgement: Decreased awareness of safety Awareness: Intellectual Problem Solving: Slow processing;Decreased initiation;Requires verbal cues;Requires tactile cues General Comments: Shakes head to yes no questons      Exercises General Exercises - Upper Extremity Shoulder Flexion: AROM;Right;Left;10 reps;Supine Shoulder ABduction: AROM;5 reps;Supine;Right;Left;Both Elbow Flexion: AROM;Strengthening;Left;Right;10 reps;Supine Wrist Flexion: AROM;Right;Left;Both;5 reps;Supine Wrist Extension: AROM;Right;Left;Both;5 reps;Supine Digit Composite Flexion: AROM;Right;Left;Both;5 reps;Supine Composite Extension: AROM;Both;Left;Right;5 reps;Supine General Exercises - Lower Extremity Ankle Circles/Pumps: AROM;10 reps;Both;Supine Quad Sets: AROM;Both;5 reps;Supine Heel Slides: AROM;Both;AAROM;5 reps;Supine Hip ABduction/ADduction: AROM;Both;5 reps;Supine    General Comments General comments (skin integrity, edema, etc.): VSS with exercise today      Pertinent Vitals/Pain Pain Assessment: No/denies pain   2540 rpm, 4.0 L/min, 3.4 watts; HR 63, 100% O2  on vent at 5PEEP with 30% FiO2, 101/73.   Home Living                      Prior Function            PT Goals (current goals can now be found in the care plan section) Acute Rehab PT Goals PT Goal Formulation: With patient Time For Goal Achievement: 06/23/17 Potential to Achieve Goals: Fair Progress towards PT goals: Not progressing toward goals - comment (intubated and sedated somewhat)     Frequency    Min 3X/week      PT Plan Discharge plan needs to be updated    Co-evaluation              AM-PAC PT "6 Clicks" Daily Activity  Outcome Measure  Difficulty turning over in bed (including adjusting bedclothes, sheets and blankets)?: Unable Difficulty moving from lying on back to sitting on the side of the bed? : Unable Difficulty sitting down on and standing up from a chair with arms (e.g., wheelchair, bedside commode, etc,.)?: Unable Help needed moving to and from a bed to chair (including a wheelchair)?: Total Help needed walking in hospital room?: Total Help needed climbing 3-5 steps with a railing? : Total 6 Click Score: 6    End of Session Equipment Utilized During Treatment:  (intubated) Activity Tolerance: Patient limited by fatigue Patient left: with call bell/phone within reach;in bed;with bed alarm set Nurse Communication: Mobility status PT Visit Diagnosis: Unsteadiness on feet (R26.81);Muscle weakness (generalized) (M62.81);Difficulty in walking, not elsewhere classified (R26.2);Other abnormalities of gait and mobility (R26.89);Pain Pain - Right/Left: Left Pain - part of body: Shoulder     Time: 2951-8841 PT Time Calculation (min) (ACUTE ONLY): 17 min  Charges:  $Therapeutic Exercise: 8-22 mins                    G Codes:       Jaidyn Usery,PT Acute Rehabilitation [phone] (pager)    Berline Lopes 06/09/2017, 3:59 PM

## 2017-06-09 NOTE — Progress Notes (Signed)
CSW met with patient and wife at bedside this morning. Patient was able to acknowledge CSW with a wave. Wife reports he is tolerating wean well today but stated that yesterday he had a difficult time with wean. She states "it seems like he has a good day and then a bad day".  CSW continues to follow for supportive intervention throughout hospitalization. Raquel Sarna, [place], [place]

## 2017-06-09 NOTE — Progress Notes (Signed)
Patient ID: James [name], male   DOB: August 21, 1961, 56 y.o.   MRN: [id number]   Advanced Heart Failure VAD Team Note  Subjective:    Events: --HVAD placed 6/28.  Returned to the OR that evening with high chest tube output, evacuation of mediastinal hematoma.  --Extubated 6/29. Milrinone stopped on 7/4. --7/4, patient developed ileus with respiratory compromise. NGT placed with 3 L suctioned out.  CXR with suspicion for aspiration PNA.  Patient had to be intubated.  He went into atrial fibrillation with RVR.  He became hypotensive and was started on norepinephrine and phenylephrine.  Amiodarone gtt begun.    --Extubated again on 7/6.  -- 7/13 Had 2 sustained episodes of VT. Had to be cardioverted 1. Second episode broke with overdrive pacing with Dr. Caryl Comes. VAD speed turned down to 2700.  --Melena due to acuteGI bleed --> 2 units PRBCs 7/14, 2 units 7/15. 3 U PRBCs 7/16,  04/20/2017 3 UPRBCs.  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  He had epinephrine, APC, and 2 clips. Intubated prior to procedure and placed on norepinephrine. Speed dropped to 2660.  --7/18 Back in VT with overdrive pacing. Lidocaine was started in addition to amiodarone. Extubated. --7/19 lidocaine stopped with confusion. Neuro consulted.  EEG normal.  CT of head no acute findings. 7/20 confusion had resolved.  --1 unit PRBCs 7/20.  --7/22, developed sustained VT with rate around 130 shortly after getting IV Lasix.  We were able to pace him out and back to NSR.  He was put back on amiodarone gtt.  --More VT on 7/25, paced out.  Back on IV amiodarone gtt.  NSR.  -- 7/31 Ramp ECHO decrease speed 2600 rpm.  -- 8/1 VT- paced out -- 8/2 VT - started on sotalol -- 8/6 Hgb 7.5, 1 unit PRBCs -- 8/7 Hgb 9.5, confused.  Head CT with right superior cerebellar artery lacunar infarcts, not acute. -- 8/8 Possible aspiration. Reintubated. -- 8/10 possible seizure => Depakote.  CT head no  change.  1 unit PRBCs.  -- Multiple suction alarms on 8/17 night which persisted so suction alarm deactivated.   Remains intubated and awake on vent. Did not wean as long yesterday.   Compression wraps in place. Also started on some free water for hypernatremia despite severe full-body volume overload with 3rd spacing. Co-ox 73%  No VT. CVP remains 7.  Sodium 149-> 150->151-> 150 -> 153  He got a dose of IV hydralazine this morning for MAP around 100.   Remains on heparin and vanc/meropenem.  WBCs down to 12.   HVAD INTERROGATION:  HVAD:  Flow 3.8 liters/min, speed 2540,  power 3.3 W,  Peak 6 Trough 2. Suction alarms off. Lavare On.     Objective:    Vital Signs:   Temp:  [97.5 F (36.4 C)-98.3 F (36.8 C)] 97.9 F (36.6 C) (08/20 0400) Pulse Rate:  [59-64] 60 (08/20 0700) Resp:  [5-37] 5 (08/20 0700) BP: (88-117)/(69-98) 98/82 (08/20 0700) SpO2:  [100 %] 100 % (08/20 0700) FiO2 (%):  [30 %] 30 % (08/20 0700) Weight:  [235 lb 10.8 oz (106.9 kg)] 235 lb 10.8 oz (106.9 kg) (08/20 0400) Last BM Date: 06/08/17 Mean arterial Pressure 80-100 Intake/Output:   Intake/Output Summary (Last 24 hours) at 06/09/17 0808 Last data filed at 06/09/17 0700  Gross per 24 hour  Intake             1977 ml  Output  1485 ml  Net              492 ml     Physical Exam    Physical Exam: GENERAL: Intubated. Awake on vent. Weak appearing HEENT: +ETT. Cor-track. NECK: Supple, JVP 7  CARDIAC:  Mechanical heart sounds LVAD hum present  LUNGS:  Clear anteriorly ABDOMEN:  NT, ND , no HSM. No bruits or masses. Hypoactive BSBS  LVAD exit site: Dressing dry and intact. No erythema or drainage. Stabilization device present and accurately applied. Driveline dressing changed daily per sterile technique. EXTREMITIES:  Warm and dry. No cyanosis, clubbing, or rash. LEs and LUE wrapped. Still with anasarca but mildly improved with compression.  Diffuse gouty changes. Wounds on legs  NEUROLOGIC:   Intubated. Follows commands. Moves all 4s SKIN: Sacrum with unstageable pressure ulcer. Wounds on LEs   Telemetry   Personally reviewed, NSR with V pacing at 60. No VT.   Labs   Basic Metabolic Panel:  Recent Labs Lab 06/06/17 0219 06/06/17 0900 06/06/17 1230 06/07/17 0306 06/08/17 0237 06/09/17 0317  NA 149*  --  150* 151* 150* 153*  K 5.5*  --  5.3* 5.0 4.3 4.1  CL 116*  --  119* 118* 118* 123*  CO2 24  --  26 26 25 27   GLUCOSE 141*  --  106* 178* 163* 111*  BUN 88*  --  83* 80* 75* 71*  CREATININE 2.06*  --  1.95* 1.81* 1.70* 1.51*  CALCIUM 8.0*  --  8.1* 8.2* 8.2* 8.1*  MG  --  1.9  --   --   --  2.2    Liver Function Tests:  Recent Labs Lab 06/03/17 1233 06/04/17 0518  AST 62* 52*  ALT 26 23  ALKPHOS 193* 174*  BILITOT 0.8 0.8  PROT 4.9* 5.0*  ALBUMIN 1.8* 1.7*   No results for input(s): LIPASE, AMYLASE in the last 168 hours. No results for input(s): AMMONIA in the last 168 hours.  CBC:  Recent Labs Lab 06/05/17 0434 06/06/17 0219 06/07/17 0306 06/08/17 0237 06/09/17 0317  WBC 16.1* 13.5* 12.3* 13.2* 12.1*  HGB 9.8* 9.2* 9.4* 9.1* 8.4*  HCT 30.6* 28.9* 29.4* 28.7* 26.7*  MCV 89.0 89.8 88.3 88.3 89.0  PLT 312 291 317 312 307    INR:  Recent Labs Lab 06/05/17 0434 06/06/17 0219 06/07/17 0306 06/08/17 0237 06/09/17 0317  INR 1.40 1.57 1.56 1.52 1.52    Other results:     Imaging   Dg Chest Port 1 View  Result Date: 06/08/2017 CLINICAL DATA:  LVAD EXAM: PORTABLE CHEST 1 VIEW COMPARISON:  Five days ago FINDINGS: Partly seen LVAD. There is a single chamber pacer from the left into the right ventricle. There is a feeding tube that could be curved back into the stomach or at the duodenal jejunal junction. Endotracheal tube tip between the clavicular heads and carina. Right upper extremity PICC with tip at the upper right atrium. Low volume chest with hazy opacity at the left base. No pneumothorax. No pulmonary edema seen. Stable  cardiomegaly. IMPRESSION: 1. Stable positioning of tubes and central line. 2. Low volume chest with stable atelectasis and possible small pleural effusion on the left. Electronically Signed   By: Monte Fantasia M.D.   On: 06/08/2017 07:21     Medications:     Scheduled Medications: . amiodarone  200 mg Per Tube BID  . chlorhexidine gluconate (MEDLINE KIT)  15 mL Mouth Rinse BID  . Chlorhexidine Gluconate Cloth  6 each Topical Q0600  . colchicine  0.6 mg Oral Daily  . collagenase   Topical Daily  . febuxostat  120 mg Oral Daily  . feeding supplement (NEPRO CARB STEADY)  1,000 mL Per Tube Q24H  . feeding supplement (PRO-STAT SUGAR FREE 64)  60 mL Per Tube BID  . free water  75 mL Per Tube Q6H  . insulin aspart  0-15 Units Subcutaneous Q4H  . insulin glargine  15 Units Subcutaneous Daily  . levETIRAcetam  500 mg Per Tube BID  . levothyroxine  50 mcg Per NG tube QAC breakfast  . magnesium oxide  400 mg Per Tube Daily  . mouth rinse  15 mL Mouth Rinse 10 times per day  . mexiletine  300 mg Per Tube Q12H  . pantoprazole sodium  40 mg Per Tube BID  . predniSONE  20 mg Per Tube Q breakfast  . sodium chloride flush  10-40 mL Intracatheter Q12H  . sotalol  80 mg Per Tube Daily    Infusions: . heparin 1,600 Units/hr (06/09/17 0700)  . meropenem (MERREM) IV Stopped (06/09/17 0539)  . norepinephrine (LEVOPHED) Adult infusion Stopped (06/04/17 2250)  . sodium chloride Stopped (06/06/17 0335)  . vancomycin Stopped (06/08/17 1600)    PRN Medications: acetaminophen (TYLENOL) oral liquid 160 mg/5 mL, albuterol, docusate, fentaNYL (SUBLIMAZE) injection, hydrALAZINE, neomycin-bacitracin-polymyxin, ondansetron (ZOFRAN) IV, oxyCODONE, sodium chloride flush   Patient Profile   56 yo with CAD s/p CABG, ischemic cardiomyopathy/chronic systolic CHF, tophaceous gout, and CKD stage 3 was admitted for diuresis and consideration for LVAD placement. S/p HVAD on 6/28  Assessment/Plan:    1.  Acute/chronic systolic CHF s/p HVAD: Ischemic cardiomyopathy.  St Jude ICD.  Echo (6/18) with EF 15%, mildly dilated RV with moderately decreased systolic function.  s/p HVAD placement 6/28 and had to return to OR to evacuate mediastinal hematoma.  He had been weaned off pressors/milrinone, but developed ileus w/ likely aspiration event 7/4, re-intubated, developed afib/RVR requiring norepinephrine. Extubated 7/6.  VAD speed turned down to 2540 with VT. VAD parameters currently look good.  Milrinone stopped 7/17 with recurrent VT.  He was started back on norepinephrine 8/9 due to soft BP after intubation and suspected septic shock, now off norepinephrine.  MAP up to 100. Volume status fine centrally. CVP 7. Main issue is massive 3rd spacing but low intravascular volume. Has gotten numerous units of albumin without improvement.  - Add hydralazine 25 mg tid for MAP control.   - Extremities wrapped with ACE wraps with some improvement in peripheral edema  - Will progressive hypernatremia and CVP 7 will not give diuretics. Continue small free H2O boluses - Off ASA with GI Bleed  - Goal INR 2-2.5. Today's INR 1.52. Covered with heparin for now until definitely no tracheostomy.  2. AKI on CKD stage 3: Creatinine stable.  3. Symptomatic anemia due to acute UGI bleeding:  Received 3 units PRBCs 7/16 and 3 units PRBCs on 05/08/2017. 1 unit PRBCs 7/20. 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  Requiring 2 clips + epi + APC. 1 unit PRBCs 8/6.  He had 1 unit PRBCs 8/10 to provide volume.  Hemoglobin stable today.    - Got Feraheme 7/28.  - Continue Protonix 40 po bid. - Off aspirin for now.  - Transfuse hgb < 8. Hgb 8.4 4. Ventricular tachycardia: Status post ventricular tachycardia 2 on 7/13. Possible suction event. VAD speed turned down to 2700.  Recurrent VT 7/17.  EP called to bedside. Overdrive pacing successful for about 10 minutes but VT recurred.  Milrinone turned off,  remained in VT overnight but hemodynamically stable.  Speed decreased to 2600.  On 7/18, we were able to pace him out of VT.  Lidocaine added then stopped due to confusion/mental status changes.  He had VT 7/22 about 30 minutes after getting IV Lasix, had to be paced out again => may have been due to rapid fluid shift.  Pt paced out of VT 3 separate occasions 7/25. Got amio bolus x 2 and started back on IV infusion.  Transitioned to amiodarone 400 mg tid.  On 8/1 had recurrent VT and paced out again.  Recurrent VT 8/2 per EP sotalol started along with amiodarone, sotalol decreased to once daily with bradycardia.  - Continue mexiletine to 300 mg BID, sotalol 80 mg daily, amio 200 mg BID. - Remains quiescent despite persistent suction events. Continue to follow closely.  - Keep K > 4.0 Mg > 2.0 5. Paroxysmal Atrial fibrillation: Developed atrial fibrillation with RVR in setting of aspiration PNA on 7/4.  In NSR currently.  6. Malnutrition:  Continue tube feeds via Cor-track. Nutrition following. 7. Aspiration PNA with acute hypoxemic respiratory failure on 7/4: He was extubated on 7/6. Tracheal aspirate with Klebsiella and citrobacter. Both sensitive to Zosyn. Completed abx 7/13. Possible aspiration 8/8.  - Completed vancomycin course, remains on meropenem.    8. Acute Respiratory Failure: Intubated 7/17 in setting of complex GI intervention. Extubated 7/18. Re-intubated 8/8.  Weak respiratory muscles.  Progressive weakness, weaned less yesterday.  He has not wanted a tracheostomy.  I suspect that he does not have the reserve to tolerate extubation.  Plan is for one-way extubation eventually, this may well be a terminal event.  Discussed with wife this morning.  Can discuss timing of this with pulmonary.   9. CAD: s/p CABG 2012. No chest pain.  Off aspirin due to GI bleeding. No change.   10. Gout: Severe tophaceous gout.  - Continue uloric. No change.  11. Delirium: Resolved. No further, though assessment  limited back on Vent.  Appreciate neuro input Possibly related to lidocaine.  Lidocaine stopped. CT of head negative. EEG ok. No further workup per neuro. Neuro  reconsulted 8/7 with AMS => ?related to fever/infection.  He has been more clear on vent.  CT head 05/27/17 and 8/10 with lacunar infarcts but do not appear acute.  Seizure activity versus myoclonus => Depakote started.  Switched to [place] with ?drug rash from Depakote.   12. UTI: on vancomycin and meropenum. Urine culture- No growth. .  13. Unstageable Pressure Ulcer: Sacrum. Consult WOC Needs to start santyl daily to enzymatically debride. Reposition R to L. WOC recommendations appreciated.     14. Bradycardia: With hypotension.  Currently back-up pacing set at 60 bpm.  Does not have atrial lead so pacing asynchronously.  Currently, on and off pacing at rate of 60.    15. Hypothyroidism: May be related to amiodarone, he is on Levoxyl. No change.   16. ID: Suspected septic shock, source = pressure ulcer versus UTI.  Afebrile now, WBCs coming down, BP improving.  Cultures so far negative.  Continue meropenem, completed vancomycin.  17. Rash: ?Drug eruption.  Has not worsened and is faint.  Meropenem versus Depakote may be the culprits. - Depakote stopped and switched for Keppra per Neuro recommendation. Rash resolved 18. Deconditioning:  Extremely severe. I am not sure that he has enough reserve to  survive 19. Hypernatremia: Free water boluses.   I reviewed the HVAD parameters from today, and compared the results to the patient's prior recorded data.  No programming changes were made.  The HVAD is functioning within specified parameters.    CRITICAL CARE Performed by: Loralie Champagne  Total critical care time: 40 minutes  Critical care time was exclusive of separately billable procedures and treating other patients.  Critical care was necessary to treat or prevent imminent or life-threatening deterioration.  Critical care was time spent  personally by me on the following activities: development of treatment plan with patient and/or surrogate as well as nursing, discussions with consultants, evaluation of patient's response to treatment, examination of patient, obtaining history from patient or surrogate, ordering and performing treatments and interventions, ordering and review of laboratory studies, ordering and review of radiographic studies, pulse oximetry and re-evaluation of patient's condition.  Loralie Champagne, MD 06/09/2017, 8:08 AM  VAD Team --- VAD ISSUES ONLY--- Pager [phone] (7am - 7am) Advanced Heart Failure Team  Pager [phone] (M-F; 7a - 4p)  Please contact [facility] for night-coverage after hours (4p -7a ) and weekends on [email or web address]

## 2017-06-09 NOTE — Progress Notes (Signed)
ANTICOAGULATION CONSULT NOTE - Follow Up Consult  Pharmacy Consult for Heparin  Indication: HVAD  No Known Allergies  Patient Measurements: Height: 6' (182.9 cm) Weight: 235 lb 10.8 oz (106.9 kg) IBW/kg (Calculated) : 77.6  Vital Signs: Temp: 98 F (36.7 C) (08/20 0800) Temp Source: Oral (08/20 0400) BP: 109/76 (08/20 1100) Pulse Rate: 73 (08/20 1100)  Labs:  Recent Labs  06/07/17 0306 06/07/17 1333 06/08/17 0237 06/09/17 0317  HGB 9.4*  --  9.1* 8.4*  HCT 29.4*  --  28.7* 26.7*  PLT 317  --  312 307  LABPROT 18.8*  --  18.4* 18.4*  INR 1.56  --  1.52 1.52  HEPARINUNFRC 0.51 0.47 0.42 0.39  CREATININE 1.81*  --  1.70* 1.51*    Estimated Creatinine Clearance: 70.6 mL/min (A) (by C-G formula based on SCr of 1.51 mg/dL (H)).  Assessment: 56 y/o M continues on heparin s/p HVAD on 2017-05-04.  No overt bleeding noted, CBC stable   AM heparin level 0.39 within goal range on heparin drip rate 1600 uts/hr.  Goal of Therapy:  Heparin level 0.3-0.5 units/mL Monitor platelets by anticoagulation protocol: Yes   Plan:  Continue heparin drip 1600 uts/hr Daily heparin level and CBC.   Leota Sauers Pharm.D. CPP, BCPS Clinical Pharmacist [phone] 06/09/2017 11:53 AM

## 2017-06-09 NOTE — Progress Notes (Signed)
Patient ID: James [name], male   DOB: 09-06-1961, 56 y.o.   MRN: [id number]  This NP visited patient at the bedside as a follow up for emotional support and pallaitive needs.  Patient remains intubated/ poor wean and with many medical setbacks prohibiting him from advancing to his goal of  successful LVAD implant  enabling him to move  forward to the quality life he was hopeful for.  Sat and talked with James [name] and his wife/[name] today.  Patient seems a bit stronger and more alert today.    There has been many conversations regarding a trach over the past week, patient and family had made decision that there will be no trach.  I spoke to the patient frankly about the likelihood that with out ventilator support this patient's long term prognosis is poor.  James [name] makes eye contact with me, and nods that he understands that this may mean his death shortly after being weaned from the ventilator.    His wife understands this too.    Wife tells me that decision has been made for one-way extubation for this coming Wednesday.    The hope is for a successful wean and that the patient will be able to support himself from a pulmonary standpoint.  Emotional support offered.  Will f/u  tomorrow.  Questions and concerns addressed  Discussed with Dr Jamison Neighbor  Time in  1200         Time out  1235     Total time spent on the unit 35 minutes  Greater than 50% of the time was spent in counseling and coordination of care  Lorinda Creed NP  Palliative Medicine Team Team Phone # [phone] Pager [phone]

## 2017-06-10 ENCOUNTER — Encounter (HOSPITAL_COMMUNITY): Payer: Self-pay

## 2017-06-10 LAB — CBC
HCT: 26.5 % — ABNORMAL LOW (ref 39.0–52.0)
HEMOGLOBIN: 8.3 g/dL — AB (ref 13.0–17.0)
MCH: 28.5 pg (ref 26.0–34.0)
MCHC: 31.3 g/dL (ref 30.0–36.0)
MCV: 91.1 fL (ref 78.0–100.0)
Platelets: 295 10*3/uL (ref 150–400)
RBC: 2.91 MIL/uL — AB (ref 4.22–5.81)
RDW: 18.4 % — ABNORMAL HIGH (ref 11.5–15.5)
WBC: 12.7 10*3/uL — AB (ref 4.0–10.5)

## 2017-06-10 LAB — GLUCOSE, CAPILLARY
GLUCOSE-CAPILLARY: 101 mg/dL — AB (ref 65–99)
GLUCOSE-CAPILLARY: 144 mg/dL — AB (ref 65–99)
Glucose-Capillary: 102 mg/dL — ABNORMAL HIGH (ref 65–99)
Glucose-Capillary: 132 mg/dL — ABNORMAL HIGH (ref 65–99)
Glucose-Capillary: 133 mg/dL — ABNORMAL HIGH (ref 65–99)
Glucose-Capillary: 168 mg/dL — ABNORMAL HIGH (ref 65–99)

## 2017-06-10 LAB — COOXEMETRY PANEL
Carboxyhemoglobin: 1.6 % — ABNORMAL HIGH (ref 0.5–1.5)
Methemoglobin: 0.9 % (ref 0.0–1.5)
O2 Saturation: 73.7 %
Total hemoglobin: 7 g/dL — ABNORMAL LOW (ref 12.0–16.0)

## 2017-06-10 LAB — PROTIME-INR
INR: 1.5
PROTHROMBIN TIME: 18.3 s — AB (ref 11.4–15.2)

## 2017-06-10 LAB — BASIC METABOLIC PANEL
ANION GAP: 5 (ref 5–15)
BUN: 69 mg/dL — ABNORMAL HIGH (ref 6–20)
CALCIUM: 8.3 mg/dL — AB (ref 8.9–10.3)
CO2: 26 mmol/L (ref 22–32)
Chloride: 122 mmol/L — ABNORMAL HIGH (ref 101–111)
Creatinine, Ser: 1.51 mg/dL — ABNORMAL HIGH (ref 0.61–1.24)
GFR, EST AFRICAN AMERICAN: 59 mL/min — AB (ref >60)
GFR, EST NON AFRICAN AMERICAN: 51 mL/min — AB (ref >60)
Glucose, Bld: 109 mg/dL — ABNORMAL HIGH (ref 65–99)
Potassium: 3.9 mmol/L (ref 3.5–5.1)
Sodium: 153 mmol/L — ABNORMAL HIGH (ref 135–145)

## 2017-06-10 LAB — LACTATE DEHYDROGENASE: LDH: 134 U/L (ref 98–192)

## 2017-06-10 LAB — HEPARIN LEVEL (UNFRACTIONATED): Heparin Unfractionated: 0.39 IU/mL (ref 0.30–0.70)

## 2017-06-10 MED ORDER — HYDRALAZINE HCL 25 MG PO TABS
37.5000 mg | ORAL_TABLET | Freq: Three times a day (TID) | ORAL | Status: DC
  Administered 2017-06-10 – 2017-06-11 (×3): 37.5 mg via ORAL
  Filled 2017-06-10 (×3): qty 2

## 2017-06-10 NOTE — Progress Notes (Signed)
Patient ID: James [name], male   DOB: [birth year], 56 y.o.   MRN: [id number]   Advanced Heart Failure VAD Team Note  Subjective:    Events: --HVAD placed 6/28.  Returned to the OR that evening with high chest tube output, evacuation of mediastinal hematoma.  --Extubated 6/29. Milrinone stopped on 7/4. --7/4, patient developed ileus with respiratory compromise. NGT placed with 3 L suctioned out.  CXR with suspicion for aspiration PNA.  Patient had to be intubated.  He went into atrial fibrillation with RVR.  He became hypotensive and was started on norepinephrine and phenylephrine.  Amiodarone gtt begun.    --Extubated again on 7/6.  -- 7/13 Had 2 sustained episodes of VT. Had to be cardioverted 1. Second episode broke with overdrive pacing with Dr. Caryl Comes. VAD speed turned down to 2700.  --Melena due to acuteGI bleed --> 2 units PRBCs 7/14, 2 units 7/15. 3 U PRBCs 7/16,  05/19/2017 3 UPRBCs.  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  He had epinephrine, APC, and 2 clips. Intubated prior to procedure and placed on norepinephrine. Speed dropped to 2660.  --7/18 Back in VT with overdrive pacing. Lidocaine was started in addition to amiodarone. Extubated. --7/19 lidocaine stopped with confusion. Neuro consulted.  EEG normal.  CT of head no acute findings. 7/20 confusion had resolved.  --1 unit PRBCs 7/20.  --7/22, developed sustained VT with rate around 130 shortly after getting IV Lasix.  We were able to pace him out and back to NSR.  He was put back on amiodarone gtt.  --More VT on 7/25, paced out.  Back on IV amiodarone gtt.  NSR.  -- 7/31 Ramp ECHO decrease speed 2600 rpm.  -- 8/1 VT- paced out -- 8/2 VT - started on sotalol -- 8/6 Hgb 7.5, 1 unit PRBCs -- 8/7 Hgb 9.5, confused.  Head CT with right superior cerebellar artery lacunar infarcts, not acute. -- 8/8 Possible aspiration. Reintubated. -- 8/10 possible seizure => Depakote.  CT head no  change.  1 unit PRBCs.  -- Multiple suction alarms on 8/17 night which persisted so suction alarm deactivated.   Remains intubated and awake on vent. Weaned vent all day yesterday at [facility] 8/5.   Compression wraps in place. On free water for hypernatremia despite severe full-body volume overload with 3rd spacing. Co-ox 74%  No VT, in NSR today. CVP remains 7-8, weight stable.  Sodium 149-> 150->151-> 150 -> 153 -> 153  MAP 80s-low 90s, started po hydralazine yesterday.   Remains on heparin gtt.  WBCs down to 12.   HVAD INTERROGATION:  HVAD:  Flow 4 liters/min, speed 2540,  power 3.3 W,  Peak 6.5 Trough 2.5. Suction alarms off. Lavare On.     Objective:    Vital Signs:   Temp:  [97.8 F (36.6 C)-98.3 F (36.8 C)] 97.8 F (36.6 C) (08/21 0311) Pulse Rate:  [58-73] 61 (08/21 0700) Resp:  [10-23] 14 (08/21 0700) BP: (87-119)/(69-88) 113/81 (08/21 0700) SpO2:  [98 %-100 %] 100 % (08/21 0700) FiO2 (%):  [30 %] 30 % (08/21 0700) Weight:  [235 lb 0.2 oz (106.6 kg)] 235 lb 0.2 oz (106.6 kg) (08/21 0311) Last BM Date: 06/09/17 Mean arterial Pressure 80s-low 90s Intake/Output:   Intake/Output Summary (Last 24 hours) at 06/10/17 0802 Last data filed at 06/10/17 0700  Gross per 24 hour  Intake             2253 ml  Output  2125 ml  Net              128 ml     Physical Exam    Physical Exam: GENERAL: Intubated. Awake on vent.  HEENT: +ETT. Cor-track. NECK: Supple, JVP 7  CARDIAC: Mechanical heart sounds LVAD hum present  LUNGS: Clear anteriorly ABDOMEN: NT, ND, no HSM. No bruits or masses. Hypoactive BSBS  LVAD exit site: Dressing dry and intact. No erythema or drainage. Stabilization device present and accurately applied. Driveline dressing changed daily per sterile technique. EXTREMITIES: Warm and dry. No cyanosis, clubbing, or rash. LEs and LUE wrapped. Decreased lower extremity edema.  Diffuse gouty changes. Wounds on legs  NEUROLOGIC: Intubated. Follows  commands. Moves all 4s SKIN: Sacrum with unstageable pressure ulcer. Wounds on LEs   Telemetry   Personally reviewed, NSR   Labs   Basic Metabolic Panel:  Recent Labs Lab 06/06/17 0900 06/06/17 1230 06/07/17 0306 06/08/17 0237 06/09/17 0317 06/10/17 0153  NA  --  150* 151* 150* 153* 153*  K  --  5.3* 5.0 4.3 4.1 3.9  CL  --  119* 118* 118* 123* 122*  CO2  --  26 26 25 27 26   GLUCOSE  --  106* 178* 163* 111* 109*  BUN  --  83* 80* 75* 71* 69*  CREATININE  --  1.95* 1.81* 1.70* 1.51* 1.51*  CALCIUM  --  8.1* 8.2* 8.2* 8.1* 8.3*  MG 1.9  --   --   --  2.2  --     Liver Function Tests:  Recent Labs Lab 06/03/17 1233 06/04/17 0518  AST 62* 52*  ALT 26 23  ALKPHOS 193* 174*  BILITOT 0.8 0.8  PROT 4.9* 5.0*  ALBUMIN 1.8* 1.7*   No results for input(s): LIPASE, AMYLASE in the last 168 hours. No results for input(s): AMMONIA in the last 168 hours.  CBC:  Recent Labs Lab 06/06/17 0219 06/07/17 0306 06/08/17 0237 06/09/17 0317 06/10/17 0153  WBC 13.5* 12.3* 13.2* 12.1* 12.7*  HGB 9.2* 9.4* 9.1* 8.4* 8.3*  HCT 28.9* 29.4* 28.7* 26.7* 26.5*  MCV 89.8 88.3 88.3 89.0 91.1  PLT 291 317 312 307 295    INR:  Recent Labs Lab 06/06/17 0219 06/07/17 0306 06/08/17 0237 06/09/17 0317 06/10/17 0153  INR 1.57 1.56 1.52 1.52 1.50    Other results:     Imaging   No results found.   Medications:     Scheduled Medications: . amiodarone  200 mg Per Tube BID  . chlorhexidine gluconate (MEDLINE KIT)  15 mL Mouth Rinse BID  . Chlorhexidine Gluconate Cloth  6 each Topical Q0600  . colchicine  0.6 mg Oral Daily  . collagenase   Topical Daily  . febuxostat  120 mg Oral Daily  . feeding supplement (NEPRO CARB STEADY)  1,000 mL Per Tube Q24H  . feeding supplement (PRO-STAT SUGAR FREE 64)  60 mL Per Tube BID  . free water  100 mL Per Tube Q6H  . hydrALAZINE  37.5 mg Oral Q8H  . insulin aspart  0-15 Units Subcutaneous Q4H  . insulin glargine  10 Units  Subcutaneous Daily  . levETIRAcetam  500 mg Per Tube BID  . levothyroxine  50 mcg Per NG tube QAC breakfast  . magnesium oxide  400 mg Per Tube Daily  . mouth rinse  15 mL Mouth Rinse 10 times per day  . mexiletine  300 mg Per Tube Q12H  . pantoprazole sodium  40 mg Per Tube  BID  . predniSONE  20 mg Per Tube Q breakfast  . sodium chloride flush  10-40 mL Intracatheter Q12H  . sotalol  80 mg Per Tube Daily    Infusions: . heparin 1,600 Units/hr (06/09/17 2210)  . meropenem (MERREM) IV Stopped (06/10/17 0715)  . norepinephrine (LEVOPHED) Adult infusion Stopped (06/04/17 2250)  . sodium chloride Stopped (06/06/17 0335)  . vancomycin Stopped (06/09/17 1532)    PRN Medications: acetaminophen (TYLENOL) oral liquid 160 mg/5 mL, albuterol, docusate, fentaNYL (SUBLIMAZE) injection, Gerhardt's butt cream, hydrALAZINE, neomycin-bacitracin-polymyxin, ondansetron (ZOFRAN) IV, oxyCODONE, sodium chloride flush   Patient Profile   56 yo with CAD s/p CABG, ischemic cardiomyopathy/chronic systolic CHF, tophaceous gout, and CKD stage 3 was admitted for diuresis and consideration for LVAD placement. S/p HVAD on 6/28  Assessment/Plan:    1. Acute/chronic systolic CHF s/p HVAD: Ischemic cardiomyopathy.  St Jude ICD.  Echo (6/18) with EF 15%, mildly dilated RV with moderately decreased systolic function.  s/p HVAD placement 6/28 and had to return to OR to evacuate mediastinal hematoma.  He had been weaned off pressors/milrinone, but developed ileus w/ likely aspiration event 7/4, re-intubated, developed afib/RVR requiring norepinephrine. Extubated 7/6.  VAD speed turned down to 2540 with VT. VAD parameters currently look good.  Milrinone stopped 7/17 with recurrent VT.  He was started back on norepinephrine 8/9 due to soft BP after intubation and suspected septic shock, now off norepinephrine.  MAP 80s-low 90s, hydralazine po started yesterday. Volume status fine centrally with CVP 7-8. Main issue is massive  3rd spacing but low intravascular volume. Has gotten numerous units of albumin without improvement.  Legs now wrapped and weight stable.  - Increase hydralazine to 37.5 mg tid, want to see MAP 85 or below.   - Extremities wrapped with ACE wraps with some improvement in peripheral edema  - Will progressive hypernatremia and CVP 7-8 will not give diuretics. Continue free H2O boluses.  Weight stable over the last day.  - Off ASA with GI Bleed  - Goal INR 2-2.5. Today's INR 1.52. Covered with heparin for now until definitely no tracheostomy.  2. AKI on CKD stage 3: Creatinine stable at 1.5.  3. Symptomatic anemia due to acute UGI bleeding:  Received 3 units PRBCs 7/16 and 3 units PRBCs on 05/01/2017. 1 unit PRBCs 7/20. 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  Requiring 2 clips + epi + APC. 1 unit PRBCs 8/6.  He had 1 unit PRBCs 8/10 to provide volume.  Hemoglobin stable today.    - Got Feraheme 7/28.  - Continue Protonix 40 po bid. - Off aspirin for now.  - Transfuse hgb < 8. Hgb 8.3.  4. Ventricular tachycardia: Status post ventricular tachycardia 2 on 7/13. Possible suction event. VAD speed turned down to 2700.  Recurrent VT 7/17. EP called to bedside. Overdrive pacing successful for about 10 minutes but VT recurred.  Milrinone turned off, remained in VT overnight but hemodynamically stable.  Speed decreased to 2600.  On 7/18, we were able to pace him out of VT.  Lidocaine added then stopped due to confusion/mental status changes.  He had VT 7/22 about 30 minutes after getting IV Lasix, had to be paced out again => may have been due to rapid fluid shift.  Pt paced out of VT 3 separate occasions 7/25. Got amio bolus x 2 and started back on IV infusion.  Transitioned to amiodarone 400 mg tid.  On 8/1 had recurrent VT and  paced out again.  Recurrent VT 8/2 per EP sotalol started along with amiodarone, sotalol decreased to once daily with bradycardia.  - Continue mexiletine  to 300 mg BID, sotalol 80 mg daily, amio 200 mg BID. - Remains quiescent. Continue to follow closely.  - Keep K > 4.0 Mg > 2.0 5. Paroxysmal Atrial fibrillation: Developed atrial fibrillation with RVR in setting of aspiration PNA on 7/4.  In NSR currently.  6. Malnutrition:  Continue tube feeds via Cor-track. Nutrition following. 7. Aspiration PNA with acute hypoxemic respiratory failure on 7/4: He was extubated on 7/6. Tracheal aspirate with Klebsiella and citrobacter. Both sensitive to Zosyn. Completed abx 7/13. Possible aspiration 8/8.  - Completed vancomycin/meropenem course.    8. Acute Respiratory Failure: Intubated 7/17 in setting of complex GI intervention. Extubated 7/18. Re-intubated 8/8.  Weak respiratory muscles. He has not wanted a tracheostomy.  He was able to wean all day yesterday at [facility] 8/5, starting wean again today.  Plan is for one-way extubation tomorrow (Wednesday), this may well be a terminal event though weaning yesterday was more optimistic.    9. CAD: s/p CABG 2012. No chest pain.  Off aspirin due to GI bleeding. No change.   10. Gout: Severe tophaceous gout.  - Continue uloric. No change.  11. Delirium: Resolved. No further, though assessment limited back on Vent.  Appreciate neuro input Possibly related to lidocaine.  Lidocaine stopped. CT of head negative. EEG ok. No further workup per neuro. Neuro  reconsulted 8/7 with AMS => ?related to fever/infection.  He has been more clear on vent.  CT head 05/27/17 and 8/10 with lacunar infarcts but do not appear acute.  Seizure activity versus myoclonus => Depakote started.  Switched to [place] with ?drug rash from Depakote.   12. UTI: on vancomycin and meropenum. Urine culture with no growth 13. Unstageable Pressure Ulcer: Sacrum. Consult WOC - Needs to start santyl daily to enzymatically debride. Reposition R to L. WOC recommendations appreciated.     14. Bradycardia: With hypotension.  Currently back-up pacing set at 60 bpm.  Does  not have atrial lead so pacing asynchronously.  Currently, on and off pacing at rate of 60.    15. Hypothyroidism: May be related to amiodarone, he is on Levoxyl. No change.   16. ID: Suspected septic shock, source = pressure ulcer versus UTI.  Afebrile now, WBCs coming down, BP improving.  Cultures so far negative.  Now off vancomycin/meropenem.   17. Rash: ?Drug eruption.  Has not worsened and is faint.  Meropenem versus Depakote may be the culprits. - Depakote stopped and switched for Keppra per Neuro recommendation. Rash resolved 18. Deconditioning:  Extremely severe. Guarded long-term prognosis.  19. Hypernatremia: Free water boluses, stable today.   I reviewed the HVAD parameters from today, and compared the results to the patient's prior recorded data.  No programming changes were made.  The HVAD is functioning within specified parameters.    CRITICAL CARE Performed by: Loralie Champagne  Total critical care time: 35 minutes  Critical care time was exclusive of separately billable procedures and treating other patients.  Critical care was necessary to treat or prevent imminent or life-threatening deterioration.  Critical care was time spent personally by me on the following activities: development of treatment plan with patient and/or surrogate as well as nursing, discussions with consultants, evaluation of patient's response to treatment, examination of patient, obtaining history from patient or surrogate, ordering and performing treatments and interventions, ordering and review of laboratory  studies, ordering and review of radiographic studies, pulse oximetry and re-evaluation of patient's condition.  Loralie Champagne, MD 06/10/2017, 8:02 AM  VAD Team --- VAD ISSUES ONLY--- Pager [phone] (7am - 7am) Advanced Heart Failure Team  Pager [phone] (M-F; 7a - 4p)  Please contact [facility] for night-coverage after hours (4p -7a ) and weekends on [email or web address]

## 2017-06-10 NOTE — Progress Notes (Addendum)
PULMONARY / CRITICAL CARE MEDICINE   Name: James [name] MRN: [id number] DOB: 1961-05-28    ADMISSION DATE:  04-19-17 CONSULTATION DATE:  04/23/2017  REFERRING MD:  Laneta Simmers - CVTS  CHIEF COMPLAINT:  Acute respiratory failure  BRIEF SUMMARY:  56 y.o. male with history of systolic congestive heart failure with EF 15%, coronary artery disease, chronic renal failure, diabetes, and essential hypertension admitted on 2017/04/19 for ventricular assist device placement. Patient previously turned down by [facility] for transplant due to limitations in his mobility and his underlying gout. Course complicated by acute on chronic renal failure. He has had recurrent episodes of supraventricular tachycardia and GI bleeding. Patient was reintubated on 8/8 with concern for aspiration secondary to increased work of breathing and displaced NG tube. PCCM was consulted for help.  SUBJECTIVE:  No acute events overnight.   REVIEW OF SYSTEMS:  Unable to obtain as patient is intubated.  VITAL SIGNS: BP 112/80   Pulse 62   Temp 97.8 F (36.6 C) (Oral)   Resp 16   Ht 6' (1.829 m)   Wt 235 lb 0.2 oz (106.6 kg)   SpO2 100%   BMI 31.87 kg/m   HEMODYNAMICS: CVP:  [3 mmHg-11 mmHg] 8 mmHg  VENTILATOR SETTINGS: Vent Mode: PSV;CPAP FiO2 (%):  [30 %] 30 % Set Rate:  [14 bmp] 14 bmp Vt Set:  [620 mL] 620 mL PEEP:  [5 cmH20] 5 cmH20 Pressure Support:  [8 cmH20] 8 cmH20 Plateau Pressure:  [13 cmH20-17 cmH20] 17 cmH20  INTAKE / OUTPUT: I/O last 3 completed shifts: In: 3531 [I.V.:586; NG/GT:2145; IV Piggyback:800] Out: 3350 [Urine:2325; Stool:1025]  PHYSICAL EXAMINATION: General:  Awake. Comfortable. Wife again at bedside. Integument:  Warm & dry. No rash . Dry. No rash. HEENT:  No scleral icterus. Endotracheal tube remains in place.. Cardiovascular: Regular rate. Anasarca. Unable to appreciate any JVD.  Pulmonary:  Diminished breath sounds obscured by LVAD. Symmetric chest wall expansion  on ventilator. Normal work of breathing on pressure support 8/5. Abdomen: Protuberant. Nontender. Normal bowel sounds. Neurological: Nods to questions. Attends to voice. Grossly nonfocal.  LABS:  BMET  Recent Labs Lab 06/08/17 0237 06/09/17 0317 06/10/17 0153  NA 150* 153* 153*  K 4.3 4.1 3.9  CL 118* 123* 122*  CO2 25 27 26   BUN 75* 71* 69*  CREATININE 1.70* 1.51* 1.51*  GLUCOSE 163* 111* 109*   Electrolytes  Recent Labs Lab 06/06/17 0900  06/08/17 0237 06/09/17 0317 06/10/17 0153  CALCIUM  --   < > 8.2* 8.1* 8.3*  MG 1.9  --   --  2.2  --   < > = values in this interval not displayed. CBC  Recent Labs Lab 06/08/17 0237 06/09/17 0317 06/10/17 0153  WBC 13.2* 12.1* 12.7*  HGB 9.1* 8.4* 8.3*  HCT 28.7* 26.7* 26.5*  PLT 312 307 295   Coag's  Recent Labs Lab 06/08/17 0237 06/09/17 0317 06/10/17 0153  INR 1.52 1.52 1.50   Sepsis Markers No results for input(s): LATICACIDVEN, PROCALCITON, O2SATVEN in the last 168 hours. ABG No results for input(s): PHART, PCO2ART, PO2ART in the last 168 hours. Liver Enzymes  Recent Labs Lab 06/03/17 1233 06/04/17 0518  AST 62* 52*  ALT 26 23  ALKPHOS 193* 174*  BILITOT 0.8 0.8  ALBUMIN 1.8* 1.7*    Cardiac Enzymes No results for input(s): TROPONINI, PROBNP in the last 168 hours.  Glucose  Recent Labs Lab 06/09/17 1236 06/09/17 1641 06/09/17 1958 06/10/17 0000 06/10/17 0347  06/10/17 0755  GLUCAP 175* 170* 132* 102* 101* 133*    Imaging No results found.   IMAGING/STUDIES: CT HEAD W/O 8/10: IMPRESSION: 1. No acute intracranial process. 2. 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 PORT CXR 8/11:  Previously reviewed by me. No focal opacity. Right upper extremity central venous catheter in place. Endotracheal tube in good positioning. Enteric feeding tube coursing below diaphragm. PORT CXR 8/14:  Previously reviewed by me.  LVAD in place. No new focal opacity. No pleural effusion appreciated. Endotracheal tube and central venous catheter in good position. Enteric feeding tube coursing below diaphragm. Persistent silhouetting of left hemidiaphragm and lower lung opacity.  MICROBIOLOGY: MRSA PCR 6/22:  Positive Blood Cultures x2 7/5:  Negative  Tracheal Aspirate Culture 7/5:  Citrobacter koseri & Klebsiella pneumoniae  Tracheal Aspirate Culture 7/7:  Klebsiella pneumoniae MRSA PCR 7/26:  Negative  Urine Culture 8/7:  Multiple Species  Blood Cultures x3 8/7:  Negative  Tracheal Aspirate Culture 8/8:  Multiple Organisms Present Urine Culture 8/9:  Negative Blood Cultures x2 8/11:  Negative  Tracheal Aspirate Culture 8/11:  Negative  Blood Cultures x2 8/12:  Negative   ANTIBIOTICS: Zinacef 6/28 (periop) Zosyn 7/5 - 7/13 Vancomycin 7/5 - 7/7; 8/8 >>> Merrem 8/8 >>>  SIGNIFICANT EVENTS: 06/22 - Admit  06/28 - Implanting of LVAD 07/05 - Respiratory arrest post feculent vomiting episode 07/06 - Extubated 07/17 - Intubated during EGD 07/18 - Extubated 08/08 - Respiratory distress & intubated. Lasix x2 doses given. 08/13 - Worsened renal function w/ Lasix w/o significant diuresis. Weaned on PS some.  08/14 - Lasix given by Cardiology 08/15 - Weaned off Levophed 08/16 - Tolerated PS 8/5 for the majority of the day. Tube feedings stopped as possible contributor to hyperkalemia. 08/20 - Requiring day of recovery post PS weaning yesterda. Hypernatremia slightly worse today. Tolerated lengthy PS 8/5 run today.  LINES/TUBES: OETT 6/28 - 6/29 ; 7/5 - 7/6; 7/17-7/18; 8/8 >>> RUE TL PICC 7/2 >>> R NGT >>> FOLEY >>> RECTAL TUBE >>>  ASSESSMENT / PLAN:    PULMONARY A: Acute, Recurrent Hypoxic Respiratory Failure: Patient and family would not want tracheostomy. Aspiration Pneumonia:  Secondary to tube feedings.  P:   Daily pressure support weaning Planning for one-way extubation tomorrow Continuous  pulse oximetry monitoring May require Morphine for palliation of dyspnea post extubation   CARDIOVASCULAR A:  Acute on chronic systolic congestive heart failure: Turned down for heart transplant. Status post LVAD. Atrial fibrillation Recurrent ventricular tachycardia Mediastinal hematoma: Status post evacuation post-LVAD. H/O essential hypertension  P:  Management per cardiology and thoracic surgery Vitals per unit protocol Continuous telemetry monitoring  RENAL A:   Acute on chronic renal failure stage III: Stable. Hyperkalemia: Resolved. Hypernatremia/Hyperchloremia:  Stable overall today.  P:   Continuing free water 100 mL via tube every 6 hours Diuresis as per primary service Continuing Foley catheter to monitor urine output Continue to monitor electrolytes and renal function daily  GASTROINTESTINAL A:   Severe protein-calorie malnutrition Chronic mesenteric ischemia  P:   NPO  HEMATOLOGIC A:   Anemia:  Hemoglobin remained stable. Multifactorial with contribution from mediastinal hematoma. No evidence of active bleeding. Leukocytosis: No significant change from yesterday. Coagulopathy: Secondary to systemic and coagulation.   P:  Trending cell counts daily with CBC while on systemic anticoagulation  INFECTIOUS A:   Aspiration pneumonia FUO: Cultures repeatedly negative.  P:   Continuing treatment with meropenem as above Plan  to reculture for fever  ENDOCRINE A:   Diabetes Mellitus Type 2:  Glucose too tightly controlled. Hypothyroidism    P:   Continuing Synthroid Continuing Lantus 10 units subcutaneous daily Continuing Accu-Cheks every 4 hours Continuing moderate dose sliding scale algorithm  NEUROLOGIC/MUSCULOSKELETAL A:   Sedation on ventilator Gout  P:   Desired RASS:  0 to -1 AED:  Keppra via tube BID. Continuing Colchicine (started 8/13) Continuing Prednisone 20mg  daily  Fentanyl IV prn pain Continuing Oxycodone prn  pain  Prophylaxis: Heparin drip per protocol & Protonix via tube BID. Diet:  NPO. Continuing tube feedings for now. Code Status:  Partial Code as per previous physician discussions. Disposition:   planning for one-way extubation on 8/22 per my discussion with wife today. Family Update:   Wife updated at bedside 8/21.  DISCUSSION:  56 y.o. male with acute hypoxic respiratory failure. Profound respiratory muscle weakness. Planning for one-way extubation tomorrow. Full ventilator support overnight for rest.   I have spent a total of 31 minutes of critical care time today caring for the patient, updating with at bedside, and reviewing the patient's electronic medical record.   Remainder of care as per primary service and other consultants.  Donna Christen Jamison Neighbor, M.D. [facility]monary [facility] Critical [facility] Pager:  [phone] After 3pm or if no response, call [phone] 06/10/2017, 8:57 AM

## 2017-06-10 NOTE — Progress Notes (Signed)
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 Returned to OR later that day for evacuation of mediastinal hematoma. On 7/4 developed ileus with likely aspiration and intubated 7/4-7/6.   Pt developed Heme + stools with progressive anemia as well as  [name] 05/02/17.  He underwent EGD with showed suspected AVM 04/26/2017. He was intubated for procedure and extubated 05/08/17 PMH - gout, DMII, HTN, CKD, CAD S/P CABGx6 2012, chronic systolic heart failure and [facility] jud[facility] ICD.     PT Comments    Pt admitted with above diagnosis. Pt currently with functional limitations due to strength and endurance deficits as well as complicated medical course limiting treatment.  Pt was able to complete exercises on right hemibody but refused to exercise left hemibody.  Wife in room and plans to encourage exercise on left later in day.  Will continue PT as able. Pt will benefit from skilled PT to increase their independence and safety with mobility to allow discharge to the venue listed below.     Follow Up Recommendations  SNF;Supervision/Assistance - 24 hour     Equipment Recommendations  Other (comment) (TBD next venue)    Recommendations for Other Services       Precautions / Restrictions Precautions Precautions: Sternal Precaution Comments: Heartware HVAD Restrictions Weight Bearing Restrictions: No    Mobility  Bed Mobility                  Transfers                    Ambulation/Gait                 Stairs            Wheelchair Mobility    Modified Rankin (Stroke Patients Only)       Balance                                            Cognition Arousal/Alertness: Awake/alert Behavior During Therapy: WFL for tasks assessed/performed;Flat affect Overall  Cognitive Status: Difficult to assess Area of Impairment: Following commands;Awareness;Problem solving                   Current Attention Level: Focused Memory: Decreased recall of precautions Following Commands: Follows one step commands with increased time;Follows one step commands consistently Safety/Judgement: Decreased awareness of safety Awareness: Intellectual Problem Solving: Slow processing;Decreased initiation;Requires verbal cues;Requires tactile cues General Comments: Shakes head to yes no questons      Exercises General Exercises - Upper Extremity Shoulder Flexion: AROM;Right;10 reps;Supine Elbow Flexion: AROM;Right;10 reps;Supine Wrist Flexion: AROM;Right;5 reps;Supine Wrist Extension: AROM;Right;5 reps;Supine Digit Composite Flexion: AROM;Right;5 reps;Supine Composite Extension: AROM;Right;5 reps;Supine General Exercises - Lower Extremity Ankle Circles/Pumps: AROM;10 reps;Both;Supine (resisted movement) Quad Sets: AROM;Both;Supine;10 reps Other Exercises Other Exercises: Pt completed exercises right UE and some on right LE and then refused to perform any more.      General Comments General comments (skin integrity, edema, etc.): VSS with exercise      Pertinent Vitals/Pain Pain Assessment: 0-10 Faces Pain Scale: Hurts whole lot Pain Location: buttocks and all 4 extremities Pain Descriptors / Indicators: Grimacing;Sore;Guarding Pain Intervention(s): Limited activity within patient's tolerance;Monitored during session;Repositioned;Patient  requesting pain meds-RN notified    Home Living                      Prior Function            PT Goals (current goals can now be found in the care plan section) Progress towards PT goals: Not progressing toward goals - comment (self limiting)    Frequency    Min 3X/week      PT Plan Current plan remains appropriate    Co-evaluation PT/OT/SLP Co-Evaluation/Treatment: Yes             AM-PAC PT "6 Clicks" Daily Activity  Outcome Measure  Difficulty turning over in bed (including adjusting bedclothes, sheets and blankets)?: Unable Difficulty moving from lying on back to sitting on the side of the bed? : Unable Difficulty sitting down on and standing up from a chair with arms (e.g., wheelchair, bedside commode, etc,.)?: Unable Help needed moving to and from a bed to chair (including a wheelchair)?: Total Help needed walking in hospital room?: Total Help needed climbing 3-5 steps with a railing? : Total 6 Click Score: 6    End of Session Equipment Utilized During Treatment:  (intubated) Activity Tolerance: Patient limited by fatigue;Patient limited by pain Patient left: with call bell/phone within reach;in bed;with bed alarm set;with family/visitor present Nurse Communication: Mobility status;Patient requests pain meds PT Visit Diagnosis: Unsteadiness on feet (R26.81);Muscle weakness (generalized) (M62.81);Difficulty in walking, not elsewhere classified (R26.2);Other abnormalities of gait and mobility (R26.89);Pain Pain - part of body: Knee;Leg;Shoulder;Arm;Hand;Ankle and joints of foot;Hip     Time: 5621-3086 PT Time Calculation (min) (ACUTE ONLY): 15 min  Charges:  $Therapeutic Exercise: 8-22 mins                    G Codes:       Kao Conry,PT Acute Rehabilitation [phone] (pager)    Berline Lopes 06/10/2017, 9:39 AM

## 2017-06-10 NOTE — Progress Notes (Signed)
CSW visited bedside today and met with patient, wife and mother. Patient was awake and acknowledged CSW with nod of the head. Patient's wife shared conversations via tablet writing with patient and he appears to be adamant about not wanting a trach. Wife feels patient is at peace with his decision and he understands the plan to extubate tomorrow. Wife tearful but states she also is at peace with the decision. CSW provided supportive intervention and will continue to follow throughout hospitalization. Raquel Sarna, [employer], [place]

## 2017-06-10 NOTE — Progress Notes (Signed)
FMLA paperwork completed on behalf of spouse and faxed to provided # [phone] attn: Thom Chimes. Copy of forms scanned into medical records.  Ave Filter, RN

## 2017-06-10 NOTE — Consult Note (Signed)
WOC verified with PT department, they would not be able to start treatment for sacral wound until tomorrow. I returned to the patient's room and spoke with the patient's wife outside the room and James [name] continues to want to wait on the treatment. Will re-evaluate the patient later this week. Offered my prayers to her and her family for the upcoming days.   Keely Drennan [facility], CNS, [employer] [phone]

## 2017-06-10 NOTE — Progress Notes (Signed)
ANTICOAGULATION CONSULT NOTE - Follow Up Consult  Pharmacy Consult for Heparin  Indication: HVAD  No Known Allergies  Patient Measurements: Height: 6' (182.9 cm) Weight: 235 lb 0.2 oz (106.6 kg) IBW/kg (Calculated) : 77.6  Vital Signs: Temp: 97.8 F (36.6 C) (08/21 0311) Temp Source: Oral (08/21 0311) BP: 112/80 (08/21 0803) Pulse Rate: 61 (08/21 0700)  Labs:  Recent Labs  06/08/17 0237 06/09/17 0317 06/10/17 0153  HGB 9.1* 8.4* 8.3*  HCT 28.7* 26.7* 26.5*  PLT 312 307 295  LABPROT 18.4* 18.4* 18.3*  INR 1.52 1.52 1.50  HEPARINUNFRC 0.42 0.39 0.39  CREATININE 1.70* 1.51* 1.51*    Estimated Creatinine Clearance: 70.6 mL/min (A) (by C-G formula based on SCr of 1.51 mg/dL (H)).  Assessment: 56 y/o M continues on heparin s/p HVAD on Apr 27, 2017.  No overt bleeding noted, CBC stable   Heparin level 0.39 this am within goal range on heparin drip rate 1600 uts/hr.  Goal of Therapy:  Heparin level 0.3-0.5 units/mL Monitor platelets by anticoagulation protocol: Yes   Plan:  Continue heparin drip 1600 uts/hr Daily heparin level and CBC.   Leota Sauers Pharm.D. CPP, BCPS Clinical Pharmacist [phone] 06/10/2017 8:14 AM

## 2017-06-10 NOTE — Consult Note (Signed)
WOC Nurse wound follow up Wound type: Unstageable pressure injury; sacrum Measurement:4.5cm x 4.0cm x 0.2cm  Wound bed: 90% yellow/black soft; 10% pink around the wound edges  Drainage (amount, consistency, odor) minimal, serosanguinous  Periwound: intact  Dressing procedure/placement/frequency: Continue enzymatic debridement ointment, discussed addition of hydrotherapy with patient and his wife today. At this time patient request to wait on starting therapy.  He is awake but sleepy, follows commands so I feel that he is making a clear decision.  Advantages of this therapy explained and that now that slough is softer and less adherent would be appropriate at this time. Would require patient to be turned on his side for 30 minutes or so each day.  Patient continues to request to wait. Wife exited room to speak with WOC privately and request that we discuss again later today if he could benefit from one treatment today since tomorrow it is planned for extubation without re-intubation and that "if he makes it" then he would have started the therapy.  I have explained that maybe she should consider letting him rest today in preparation for extubation tomorrow and that we can certainly re-evaluate the patient to start therapy later this week if his status improves. She verbalized the two options, and while teary she seems to understand that either today or later would serve the same purpose.   WOC will follow up with patient/wife around 1230 today, because will need to have PT do just after lunch to make sure staff available to complete treatment.  WOC will plan follow up on Thursday and Friday this week also.  James [name] [facility], CNS, [employer] [phone]

## 2017-06-10 NOTE — Progress Notes (Signed)
LVAD Inpatient Coordinator Rounding Note:  Admitted 04/09/2017 due to A/C Heart failure.   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 Laneta Simmers as DT VAD.  Transferred to 4E 05/17/17.  Transferred back to ICU 2H09 on 05/28/17 with possible aspiration pneumonia requiring re-intubation.  Pt is currently on SBT tolerating well this morning.   Vital signs: Temp: 98 HR: 62 Doppler: 96 Auto BP: 112/80 (91) O2 Sat: 100% on 30% FIO2 and 5 peep Wt in lbs: Marland Kitchen.. 230>229>226>224>235>221>217>223>224>224>234>240>245>235  LVAD interrogation reveals:  Speed: 2540 Flow: 4.0 Power: 3.5  w Alarms: suction alarm now OFF  Peak:  6.5 Trough:  2.4 HCT: 26.5 Low flow alarm setting: 2.5 High watt alarm setting: 5.5  Suction: OFF  Lavare cycle: on  Blood Products: 6/28> 5 PRBC's, 6 FFP 7/1> 1 PRBC 7/9> 1 PRBC 7/13>3 PRBC 7/14>3 PRBC 7/15> 1 PRBC 7/16>3PRBC 7/17> 3 PRBC 7/20> 1 PRBC 8/6> 1 PRBC 8/10 > 1 PRBC  Gtts: Milrinone restarted 7/8, stopped 7/16 Levo stopped 04/26/17 restarted 06/05/2017 - stopped 05/07/17 Amiodarone stopped 04/29/17, restarted 7/17 - transitioned to PO 05/13/17; VT on 05/15/17 - IV amio re-started - 30 mg/hr stopped 05/16/17 Heparin 1600 u/hr Lidocaine 05/07/17 - stopped 7/19  TPN - started 04/24/17 for nutritional support, stopped 04/30/17  NG inserted with tube feeding started 05/27/17; placed on hold 05/28/17   Cortrak inserted 06/07/17 - started TF 05/1917   Neuro: 05/27/17- Neurology consult for lethargy, mouth tremors, not vocal, not following commands> Head CT and EEG 05/28/17 - Continual EEG monitoring  05/30/17 - started Depakote for seizure activity - stopped 06/03/17 for rash - will start Keppra   Arrhythmia: 04/24/17 - Afib with RVR - started amiodarone 05/02/17 - 2 sustained episodes of VT - cardioverted x 1; second broke with overdrive pacing 7/17- wide complex tachycardia- Amio bolus x3 and gtt at 60 mg/hr 05/07/17 - sustained VT - converted with  overdrive pacing; Lidocaine started in addition to amiodarone 05/08/17 - Lidocaine stopped due to confusion (lido level 12.4) 05/14/17 - sustained VT - converted 3 separate occassions with overdrive pacing 05/19/17 - sustained VT-converted with overdrive pacing  05/26/17- Afib rate in the 50's 05/27/17 - NSR   Respiratory: 04/23/17 - re-intubated due to respiratory failure secondary to suspected aspiration pneumonia 04/25/17-extubated Jun 05, 2017- intubated for EGD 06-05-2017- extubated 05/28/17 - re-intubated for suspected aspiration pneumonia  Drive Line: Daily Dressing Kits with Aquacell AG silver strips per protocol - will advance to every other day dressing changes.  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 He had epinephrine, APC,    and 2 clips.  - 7/19 - Lidocaine gtt stopped due to confusion (lido toxicity) -8/7- Neurology consult- lethargy, mouth tremors, not vocal, not following commands> Head CT and EEG, blood and urine    cultures sent, Ammonia-20 8/8 - re-intubated for possible aspiration pneumonia   Plan/Recommendations:  1. Currently every other day dressing changes;  Wife perficient in performing dressing changes.  2. Will continue to support pt and wife emotionally during this time.  3. Planning for one-way extubation tomorrow.  4. Please call VAD pager with patient and  equipment concerns.  Carlton Adam RN, BSN VAD Coordinator 24/7 pager [phone]

## 2017-06-10 NOTE — Care Management Note (Addendum)
Case Management Note  Patient Details  Name: James [name] MRN: [id number] Date of Birth: 26-Apr-1961  Subjective/Objective:    LVAD implanted 6/28, conts with melena and maroon stools, hgb down, received 2 units of prbcs 7/15, CVP low this am, co-ox 74/5 conts on milrinone, INR elevated 3.22, GI following, plan for EGD when INR allows. Discussed in LOS , appropriate for continued stay.   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   7/25 1014 Letha Cape RN, BSN - patient is getting close to CIR, awaiting to see if will MD will clear for CIR. Patient back into VT in 120's, started on amio.   05/27/17- 1000- [name] RN, CM- pt has had multiple post op set backs/complications- including ileus, GIB, VT, and vol overload. CIR continues to follow for possible admission when medically stable- pt more lethargic today and not vocal- MD to consult Neurology. CM will continue to follow.   8/13 - Letha Cape RN, BSN - LVAD placed 6/28, reintubated 8/8 with possible asp pna, febrile, wbc rising, cortrak tube feeds will try to cont to wean in the next 2 days if fails then family to make decision for Trach or one way extubation.  Palliative following. conts on levophed, depacon, iv abx, Discussed in LOS , appropriate for continued stay.  8/15 1117 Letha Cape RN, BSN - per MD note  He weaned better yesterday on vent. Will wean again today. He would not want tracheostomy. As long as we are making progress with vent wean, will continue towards extubation. If he stops making progress, will need to discuss palliative care.   8/21 1718 Letha Cape RN, BSN- wife is planning for one way extubation tomorrow, patient is not wanting trach and is at peace with  this decision and wife also per CSW note.  8/22 1633 Letha Cape RN, BSN- for one way extubation today to 4 liters [place], conts on hep drip, iv abx, morphine drip is at bedside in case comfort care is goal.   8/23 1717 Letha Cape RN, BSN- patient conts to be on 4 liters [place], participated with physical therapy.   8/27 1002 Letha Cape RN, BSN- one way extubation on 8/22, receiving hydrotherapy , unstageable pressure sacral ulcer, conts on hep drip, pt eval rec [facility].  [facility] state can not take patient, staff not trained on LVAD patient's.  8/31 1007 Letha Cape RN, BSN - [facility] ( [facility] ) stated that patient was not appropriate for them because they do not have the staff that knows how to work with LVAD's.  NCM informed them that the LVAD coordinators state they would train their staff , but they still state can't take patient.  NCM spoke with CSW, Lorri Frederick, she states the Team will be meeting with [facility]. Per Adela Lank conversation with the wife, they do not want to take patient to [facility] because they had a bad experience their, (family member passed away).  If they do not want to go to [facility] , their are only two SNF's that will take LVAD patient's , [facility] and [facility].  NCM will cont to follow for disposition.                 Action/Plan: NCM will follow for dc needs.  Expected Discharge Date:                  Expected Discharge Plan:  IP Rehab Facility  In-House Referral:  NA  Discharge planning Services  CM Consult  Post Acute Care Choice:    Choice offered to:     DME Arranged:    DME Agency:     HH Arranged:    HH Agency:     Status of Service:  In process, will continue to follow  If discussed at Long Length of Stay Meetings, dates discussed:    Additional Comments:  Leone Haven, RN 06/10/2017, 5:18 PM

## 2017-06-11 LAB — GLUCOSE, CAPILLARY
GLUCOSE-CAPILLARY: 102 mg/dL — AB (ref 65–99)
GLUCOSE-CAPILLARY: 126 mg/dL — AB (ref 65–99)
GLUCOSE-CAPILLARY: 136 mg/dL — AB (ref 65–99)
GLUCOSE-CAPILLARY: 150 mg/dL — AB (ref 65–99)
Glucose-Capillary: 157 mg/dL — ABNORMAL HIGH (ref 65–99)
Glucose-Capillary: 121 mg/dL — ABNORMAL HIGH (ref 65–99)

## 2017-06-11 LAB — CBC
HEMATOCRIT: 25 % — AB (ref 39.0–52.0)
Hemoglobin: 7.7 g/dL — ABNORMAL LOW (ref 13.0–17.0)
MCH: 28.1 pg (ref 26.0–34.0)
MCHC: 30.8 g/dL (ref 30.0–36.0)
MCV: 91.2 fL (ref 78.0–100.0)
PLATELETS: 289 10*3/uL (ref 150–400)
RBC: 2.74 MIL/uL — AB (ref 4.22–5.81)
RDW: 18.7 % — AB (ref 11.5–15.5)
WBC: 13.5 10*3/uL — AB (ref 4.0–10.5)

## 2017-06-11 LAB — BASIC METABOLIC PANEL
Anion gap: 6 (ref 5–15)
BUN: 63 mg/dL — AB (ref 6–20)
CHLORIDE: 124 mmol/L — AB (ref 101–111)
CO2: 26 mmol/L (ref 22–32)
Calcium: 8.5 mg/dL — ABNORMAL LOW (ref 8.9–10.3)
Creatinine, Ser: 1.39 mg/dL — ABNORMAL HIGH (ref 0.61–1.24)
GFR, EST NON AFRICAN AMERICAN: 56 mL/min — AB (ref >60)
Glucose, Bld: 116 mg/dL — ABNORMAL HIGH (ref 65–99)
POTASSIUM: 4.1 mmol/L (ref 3.5–5.1)
SODIUM: 156 mmol/L — AB (ref 135–145)

## 2017-06-11 LAB — HEPARIN LEVEL (UNFRACTIONATED): HEPARIN UNFRACTIONATED: 0.37 [IU]/mL (ref 0.30–0.70)

## 2017-06-11 LAB — LACTATE DEHYDROGENASE: LDH: 128 U/L (ref 98–192)

## 2017-06-11 LAB — PREPARE RBC (CROSSMATCH)

## 2017-06-11 LAB — COOXEMETRY PANEL
Carboxyhemoglobin: 1.5 % (ref 0.5–1.5)
Methemoglobin: 0.8 % (ref 0.0–1.5)
O2 SAT: 71.9 %
Total hemoglobin: 12.5 g/dL (ref 12.0–16.0)

## 2017-06-11 LAB — PROTIME-INR
INR: 1.39
PROTHROMBIN TIME: 17.2 s — AB (ref 11.4–15.2)

## 2017-06-11 MED ORDER — SODIUM CHLORIDE 0.9 % IV SOLN
Freq: Once | INTRAVENOUS | Status: AC
  Administered 2017-06-11: 09:00:00 via INTRAVENOUS

## 2017-06-11 MED ORDER — MORPHINE BOLUS VIA INFUSION
1.0000 mg | INTRAVENOUS | Status: DC | PRN
  Filled 2017-06-11: qty 10

## 2017-06-11 MED ORDER — MIDAZOLAM HCL 2 MG/2ML IJ SOLN
1.0000 mg | INTRAMUSCULAR | Status: DC | PRN
  Filled 2017-06-11: qty 4

## 2017-06-11 MED ORDER — FUROSEMIDE 10 MG/ML IJ SOLN
40.0000 mg | Freq: Once | INTRAMUSCULAR | Status: AC
  Administered 2017-06-11: 40 mg via INTRAVENOUS
  Filled 2017-06-11: qty 4

## 2017-06-11 MED ORDER — NEPRO/CARBSTEADY PO LIQD
1000.0000 mL | ORAL | Status: DC
  Filled 2017-06-11: qty 1000

## 2017-06-11 MED ORDER — HYDRALAZINE HCL 50 MG PO TABS
50.0000 mg | ORAL_TABLET | Freq: Three times a day (TID) | ORAL | Status: DC
  Administered 2017-06-11 – 2017-06-13 (×6): 50 mg via ORAL
  Filled 2017-06-11 (×6): qty 1

## 2017-06-11 MED ORDER — WARFARIN - PHARMACIST DOSING INPATIENT
Freq: Every day | Status: DC
  Administered 2017-06-11 – 2017-06-21 (×3)

## 2017-06-11 MED ORDER — MORPHINE SULFATE (PF) 4 MG/ML IV SOLN: 1.0000 mg | INTRAVENOUS | Status: DC | PRN

## 2017-06-11 MED ORDER — ORAL CARE MOUTH RINSE
15.0000 mL | OROMUCOSAL | Status: DC
  Administered 2017-06-12 – 2017-06-25 (×62): 15 mL via OROMUCOSAL

## 2017-06-11 MED ORDER — FREE WATER
200.0000 mL | Freq: Four times a day (QID) | Status: DC
  Administered 2017-06-11 – 2017-06-13 (×8): 200 mL

## 2017-06-11 MED ORDER — SODIUM CHLORIDE 0.9 % IV SOLN
1.0000 mg/h | INTRAVENOUS | Status: DC
  Filled 2017-06-11: qty 10

## 2017-06-11 MED ORDER — WARFARIN SODIUM 1 MG PO TABS
1.0000 mg | ORAL_TABLET | Freq: Once | ORAL | Status: AC
  Administered 2017-06-11: 1 mg via NASOGASTRIC
  Filled 2017-06-11: qty 1

## 2017-06-11 MED ORDER — NEPRO/CARBSTEADY PO LIQD
1000.0000 mL | ORAL | Status: DC
  Administered 2017-06-11 – 2017-06-24 (×14): 1000 mL
  Filled 2017-06-11 (×18): qty 1000

## 2017-06-11 MED ORDER — CHLORHEXIDINE GLUCONATE CLOTH 2 % EX PADS
6.0000 | MEDICATED_PAD | Freq: Every morning | CUTANEOUS | Status: DC
  Administered 2017-06-12 – 2017-06-15 (×4): 6 via TOPICAL

## 2017-06-11 NOTE — Progress Notes (Signed)
PULMONARY / CRITICAL CARE MEDICINE   Name: James [name] MRN: [id number] DOB: Jul 19, 1961    ADMISSION DATE:  03/30/2017 CONSULTATION DATE:  04/23/2017  REFERRING MD:  Laneta Simmers - CVTS  CHIEF COMPLAINT:  Acute respiratory failure  BRIEF SUMMARY:  56 y.o. male with history of systolic congestive heart failure with EF 15%, coronary artery disease, chronic renal failure, diabetes, and essential hypertension admitted on 03/21/2017 for ventricular assist device placement. Patient previously turned down by [facility] for transplant due to limitations in his mobility and his underlying gout. Course complicated by acute on chronic renal failure. He has had recurrent episodes of supraventricular tachycardia and GI bleeding. Patient was reintubated on 8/8 with concern for aspiration secondary to increased work of breathing and displaced NG tube. PCCM was consulted for help.  SUBJECTIVE:  No acute events overnight. Patient weaned on pressure support yesterday. Patient reports his pain is reasonably controlled & he has no nausea.   REVIEW OF SYSTEMS:  Unable to obtain as patient is intubated.  VITAL SIGNS: BP 110/78 (BP Location: Left Arm)   Pulse 68   Temp 98.5 F (36.9 C) (Oral)   Resp 15   Ht 6' (1.829 m)   Wt 234 lb 5.6 oz (106.3 kg)   SpO2 100%   BMI 31.78 kg/m   HEMODYNAMICS: CVP:  [1 mmHg-18 mmHg] 18 mmHg  VENTILATOR SETTINGS: Vent Mode: PRVC FiO2 (%):  [30 %] 30 % Set Rate:  [14 bmp] 14 bmp Vt Set:  [620 mL] 620 mL PEEP:  [5 cmH20] 5 cmH20 Pressure Support:  [5 cmH20-8 cmH20] 8 cmH20 Plateau Pressure:  [8 cmH20-14 cmH20] 13 cmH20  INTAKE / OUTPUT: I/O last 3 completed shifts: In: 2916 [I.V.:596; NG/GT:1820; IV Piggyback:500] Out: 3645 [Urine:1870; Emesis/NG output:450; Stool:1325]  PHYSICAL EXAMINATION: General:  Comfortable. Wife and family at bedside. No distress. Integument:  No rash on exposed skin. Warm. Dry. HEENT:  Endotracheal tube in place. No scleral  icterus. Cardiovascular: Regular rate. Anasarca relatively unchanged. LVAD sound noted. Pulmonary:  Diminished breath sounds bilaterally but relatively clear. Sounds obscured by LVAD. Symmetric chest wall expansion on ventilator. Abdomen: Nontender. Soft. Protuberant. Neurological: Nods to questions. Grossly nonfocal.  LABS:  BMET  Recent Labs Lab 06/09/17 0317 06/10/17 0153 06/11/17 0224  NA 153* 153* 156*  K 4.1 3.9 4.1  CL 123* 122* 124*  CO2 27 26 26   BUN 71* 69* 63*  CREATININE 1.51* 1.51* 1.39*  GLUCOSE 111* 109* 116*   Electrolytes  Recent Labs Lab 06/06/17 0900  06/09/17 0317 06/10/17 0153 06/11/17 0224  CALCIUM  --   < > 8.1* 8.3* 8.5*  MG 1.9  --  2.2  --   --   < > = values in this interval not displayed. CBC  Recent Labs Lab 06/09/17 0317 06/10/17 0153 06/11/17 0224  WBC 12.1* 12.7* 13.5*  HGB 8.4* 8.3* 7.7*  HCT 26.7* 26.5* 25.0*  PLT 307 295 289   Coag's  Recent Labs Lab 06/09/17 0317 06/10/17 0153 06/11/17 0224  INR 1.52 1.50 1.39   Sepsis Markers No results for input(s): LATICACIDVEN, PROCALCITON, O2SATVEN in the last 168 hours. ABG No results for input(s): PHART, PCO2ART, PO2ART in the last 168 hours. Liver Enzymes No results for input(s): AST, ALT, ALKPHOS, BILITOT, ALBUMIN in the last 168 hours.  Cardiac Enzymes No results for input(s): TROPONINI, PROBNP in the last 168 hours.  Glucose  Recent Labs Lab 06/10/17 0755 06/10/17 1130 06/10/17 1628 06/10/17 2047 06/11/17 0051 06/11/17  0357  GLUCAP 133* 168* 144* 157* 126* 102*    Imaging No results found.   IMAGING/STUDIES: CT HEAD W/O 8/10: IMPRESSION: 1. No acute intracranial process. 2. 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 PORT CXR 8/11:  Previously reviewed by me. No focal opacity. Right upper extremity central venous catheter in place. Endotracheal tube in good positioning. Enteric  feeding tube coursing below diaphragm. PORT CXR 8/14:  Previously reviewed by me. LVAD in place. No new focal opacity. No pleural effusion appreciated. Endotracheal tube and central venous catheter in good position. Enteric feeding tube coursing below diaphragm. Persistent silhouetting of left hemidiaphragm and lower lung opacity.  MICROBIOLOGY: MRSA PCR 6/22:  Positive Blood Cultures x2 7/5:  Negative  Tracheal Aspirate Culture 7/5:  Citrobacter koseri & Klebsiella pneumoniae  Tracheal Aspirate Culture 7/7:  Klebsiella pneumoniae MRSA PCR 7/26:  Negative  Urine Culture 8/7:  Multiple Species  Blood Cultures x3 8/7:  Negative  Tracheal Aspirate Culture 8/8:  Multiple Organisms Present Urine Culture 8/9:  Negative Blood Cultures x2 8/11:  Negative  Tracheal Aspirate Culture 8/11:  Negative  Blood Cultures x2 8/12:  Negative   ANTIBIOTICS: Zinacef 6/28 (periop) Zosyn 7/5 - 7/13 Vancomycin 7/5 - 7/7; 8/8 >>> Merrem 8/8 >>>  SIGNIFICANT EVENTS: 06/22 - Admit  06/28 - Implanting of LVAD 07/05 - Respiratory arrest post feculent vomiting episode 07/06 - Extubated 07/17 - Intubated during EGD 07/18 - Extubated 08/08 - Respiratory distress & intubated. Lasix x2 doses given. 08/13 - Worsened renal function w/ Lasix w/o significant diuresis. Weaned on PS some.  08/14 - Lasix given by Cardiology 08/15 - Weaned off Levophed 08/16 - Tolerated PS 8/5 for the majority of the day. Tube feedings stopped as possible contributor to hyperkalemia. 08/20 - Requiring day of recovery post PS weaning yesterda. Hypernatremia slightly worse today. Tolerated lengthy PS 8/5 run today.  LINES/TUBES: OETT 6/28 - 6/29 ; 7/5 - 7/6; 7/17-7/18; 8/8 >>> RUE TL PICC 7/2 >>> R NGT >>> FOLEY >>> RECTAL TUBE >>>  ASSESSMENT / PLAN:    PULMONARY A: Acute, Recurrent Hypoxic Respiratory Failure: Patient and family would not want tracheostomy. Aspiration Pneumonia:  Secondary to tube feedings.  P:    Planning for one-way extubation today when family ready Morphine for relief of dyspnea & discomfort if patient declines to palliate symptoms Continuous pulse oximetry Supplemental oxygen post extubation  CARDIOVASCULAR A:  Acute on chronic systolic congestive heart failure: Turned down for heart transplant. Status post LVAD. Atrial fibrillation Recurrent ventricular tachycardia Mediastinal hematoma: Status post evacuation post-LVAD. H/O essential hypertension  P:  Management per cardiology and thoracic surgery Vitals per unit protocol Continuous telemetry monitoring  RENAL A:   Acute on chronic renal failure stage III: Continually improving. Hyperkalemia: Resolved. Hypernatremia/Hyperchloremia:  Slightly worse today.  P:   Continuing free water 100 mL via tube every 6 hours Diuresis as per primary service Continuing Foley catheter to monitor urine output Continue to monitor electrolytes and renal function daily  GASTROINTESTINAL A:   Severe protein-calorie malnutrition Chronic mesenteric ischemia  P:   NPO  HEMATOLOGIC A:   Anemia:  No active bleeding. Hgb largely stable. Multifactorial with contribution from mediastinal hematoma.  Leukocytosis: Stable. Likely secondary to demargination with steroids. Coagulopathy: Secondary to systemic and coagulation.   P:  Trending cell counts daily with CBC while on systemic anticoagulation  INFECTIOUS A:   Aspiration pneumonia FUO: Cultures repeatedly negative.  P:   Continuing  treatment with meropenem as above Plan to reculture for fever  ENDOCRINE A:   Diabetes Mellitus Type 2:  Glucose still too tightly controlled. Hypothyroidism    P:   Continuing Synthroid Holding Lantus in anticipation of holding tube feedings Continuing Accu-Cheks every 4 hours Continuing moderate dose sliding scale algorithm  NEUROLOGIC/MUSCULOSKELETAL A:   Sedation on ventilator Gout  P:   Desired RASS:  0 to -1 AED:  Keppra  via tube BID. Continuing Colchicine (started 8/13) Continuing Prednisone 20mg  daily  Fentanyl IV prn pain Continuing Oxycodone prn pain Plan to use Morphine to palliate if necessary post extubation  Prophylaxis: Heparin drip per protocol & Protonix via tube BID. Diet:  NPO. Plan to hold tube feedings at the time of extubation. Code Status:  Partial Code as per previous physician discussions. Disposition:  Plan is for a one-way extubation today. Patient will remain in ICU post extubation. Family Update:   Wife updated during rounds today.  DISCUSSION:  56 y.o. male with acute hypoxic respiratory failure. Profound respiratory muscle weakness. Planning for one-way extubation today. Plan to utilize morphine to palliate symptoms patient declines. Hospital chaplain offered for spiritual support prior to extubation. All members patient's care team aware of patient and wife's plan.   I have spent a total of 32 minutes of critical care time today caring for the patient, discussing the plan of care with patient as well as patient's wife and team, and reviewing the patient's electronic medical record.   Remainder of care as per primary service and other consultants.  Donna Christen Jamison Neighbor, M.D. [facility] Pager:  [phone] After 3pm or if no response, call [phone] 06/11/2017, 7:55 AM

## 2017-06-11 NOTE — Procedures (Signed)
Extubation Procedure Note  Patient Details:   Name: James [name] DOB: [birth year] MRN: [id number]   Airway Documentation: Pt extubated for comfort care and placed on 4 lpm [place]    Evaluation  O2 sats: stable throughout Complications: No apparent complications Patient did not tolerate procedure well. Bilateral Breath Sounds: Clear, Diminished   Yes  Richmond Campbell 06/11/2017, 2:37 PM

## 2017-06-11 NOTE — Progress Notes (Signed)
Chaplain providing continual support for family of James [name] who is receiving extubation today.  Chaplain will remain available for continual support.  Chaplain would like to thank entire medical team, CSW and all who have supported this patient and family during this long stay and especially today's event.     Chaplain providing prayer, continual encouragement and times of reflection for family.     06/11/17 1504  Clinical Encounter Type  Visited With Patient and family together;Health care provider  Visit Type Follow-up;Psychological support;Spiritual support;Social support;Critical Care  Referral From Family;Nurse  Consult/Referral To Chaplain

## 2017-06-11 NOTE — Progress Notes (Signed)
Blood complete, lasix given, wife at bedside and made aware, Elease Hashimoto is to let me know when she is ready.  Hermina Barters, RN

## 2017-06-11 NOTE — Progress Notes (Signed)
Patient ID: James [name], male   DOB: December 03, 1960, 56 y.o.   MRN: [id number]  This NP visited patient at the bedside as a follow up for emotional support for patient and his family anticipating extubation today. Both patient and his wife are comfortable with decision to extubate with understanding of no reintubation and no  trach.  The hope is for a successful wean and that the patient will be able to support himself from a pulmonary standpoint.  Entry: 1415 pm  Present at bedside with family, nursing,  LVAD team, chaplain, and Dr. Jamison Neighbor with critical care, and respiratory therapy for planned extubation.    Extubated without difficulty patient appears comfortable. Family remains at bedside.  Symptom management medication orders per pulmonary.  Discussed with nursing utilization of morphine bolus for dyspnea if needed.   This is not a shift to full comfort at this point in time.  Patient and family are hopeful for improvement and continued LVAD therapy.  Patient and his wife are realistic and understands the high risk for decompensation.  If patient decompensates from a pulmonary status utilize morphine boluses and continuous morphine drip to maximize comfort.   Involves LVAD team/cardiology regarding appropriate timing for discontinuation of LVAD pump.  Entry 1730 pm  Patient is alert and appears comfortable. Wife and family remain at bedside.  Patient enjoyed a few strategically/carefully administered ice chips, tolerated it well, no cough.  Emotional support offered.  PMT team will continue to support holistically.  Questions and concerns addressed   Time in 1415        Time out  1500      Total time spent on the unit 35 minutes  Greater than 50% of the time was spent in counseling and coordination of care  Lorinda Creed NP  Palliative Medicine Team Team Phone # [phone] Pager [phone]

## 2017-06-11 NOTE — Progress Notes (Addendum)
Chaplain paged per family request to be made of plan to permanently extubate today.  Chaplain visited family for support and to express the desire to be present.  Chaplain would like to be paged if she is not on the floor at the time of extubation. Please ask for Raileigh Sabater to come to the floor when someone returns the call.   Chaplain providing continual ministry of presence, encouragement for patient and spouse, son and family.  Chaplain providing prayer and support for this family with a long stay in the hospital.  May our Creator be strength for this patient and family today.  Chaplain would like to thank the entire medical team for their extensive care for this patient and family.     06/11/17 1607  Clinical Encounter Type  Visited With Patient and family together  Visit Type Follow-up;Psychological support;Spiritual support;Social support;Critical Care  Referral From Family;Nurse  Consult/Referral To Chaplain  Spiritual Encounters  Spiritual Needs Prayer;Emotional  Stress Factors  Patient Stress Factors Exhausted;Health changes;Major life changes  Family Stress Factors Exhausted;Health changes;Major life changes

## 2017-06-11 NOTE — Progress Notes (Signed)
Patient ID: James [name], male   DOB: [birth year], 56 y.o.   MRN: [id number]   Advanced Heart Failure VAD Team Note  Subjective:    Events: --HVAD placed 6/28.  Returned to the OR that evening with high chest tube output, evacuation of mediastinal hematoma.  --Extubated 6/29. Milrinone stopped on 7/4. --7/4, patient developed ileus with respiratory compromise. NGT placed with 3 L suctioned out.  CXR with suspicion for aspiration PNA.  Patient had to be intubated.  He went into atrial fibrillation with RVR.  He became hypotensive and was started on norepinephrine and phenylephrine.  Amiodarone gtt begun.    --Extubated again on 7/6.  -- 7/13 Had 2 sustained episodes of VT. Had to be cardioverted 1. Second episode broke with overdrive pacing with Dr. Caryl Comes. VAD speed turned down to 2700.  --Melena due to acuteGI bleed --> 2 units PRBCs 7/14, 2 units 7/15. 3 U PRBCs 7/16,  04/28/2017 3 UPRBCs.  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  He had epinephrine, APC, and 2 clips. Intubated prior to procedure and placed on norepinephrine. Speed dropped to 2660.  --7/18 Back in VT with overdrive pacing. Lidocaine was started in addition to amiodarone. Extubated. --7/19 lidocaine stopped with confusion. Neuro consulted.  EEG normal.  CT of head no acute findings. 7/20 confusion had resolved.  --1 unit PRBCs 7/20.  --7/22, developed sustained VT with rate around 130 shortly after getting IV Lasix.  We were able to pace him out and back to NSR.  He was put back on amiodarone gtt.  --More VT on 7/25, paced out.  Back on IV amiodarone gtt.  NSR.  -- 7/31 Ramp ECHO decrease speed 2600 rpm.  -- 8/1 VT- paced out -- 8/2 VT - started on sotalol -- 8/6 Hgb 7.5, 1 unit PRBCs -- 8/7 Hgb 9.5, confused.  Head CT with right superior cerebellar artery lacunar infarcts, not acute. -- 8/8 Possible aspiration. Reintubated. -- 8/10 possible seizure => Depakote.  CT head no  change.  1 unit PRBCs.  -- Multiple suction alarms on 8/17 night which persisted so suction alarm deactivated.   Remains intubated and awake on vent. Weaned vent all day yesterday again at [facility] 8/5.   Compression wraps in place. On free water for hypernatremia despite severe full-body volume overload with 3rd spacing. Co-ox 74%  No VT, in NSR today. CVP remains 7-8, weight stable.  Sodium 149-> 150->151-> 150 -> 153 -> 153 -> 156  MAP 80s-low 90s, on po hydralazine.    Remains on heparin gtt.    Afebrile, WBCs 12.   HVAD INTERROGATION:  HVAD:  Flow 4 liters/min, speed 2540,  power 3.3 W,  Peak 6.5 Trough 2. Suction alarms off. Lavare On.     Objective:    Vital Signs:   Temp:  [98.1 F (36.7 C)-98.5 F (36.9 C)] 98.5 F (36.9 C) (08/22 0400) Pulse Rate:  [60-68] 68 (08/22 0700) Resp:  [12-19] 15 (08/22 0700) BP: (74-113)/(43-83) 110/78 (08/22 0700) SpO2:  [93 %-100 %] 100 % (08/22 0700) FiO2 (%):  [30 %] 30 % (08/22 0700) Weight:  [234 lb 5.6 oz (106.3 kg)] 234 lb 5.6 oz (106.3 kg) (08/22 0615) Last BM Date: 06/10/17 Mean arterial Pressure 80s-low 90s Intake/Output:   Intake/Output Summary (Last 24 hours) at 06/11/17 0802 Last data filed at 06/11/17 0700  Gross per 24 hour  Intake  1708 ml  Output             2515 ml  Net             -807 ml     Physical Exam    Physical Exam: GENERAL: Intubated. Awake on vent.  HEENT: +ETT. Cor-track. NECK: Supple, JVP 7  CARDIAC: Mechanical heart sounds LVAD hum present  LUNGS: Clear anteriorly ABDOMEN: NT, ND, no HSM. No bruits or masses. Hypoactive BSBS  LVAD exit site: Dressing dry and intact. No erythema or drainage. Stabilization device present and accurately applied. Driveline dressing changed daily per sterile technique. EXTREMITIES: Warm and dry. No cyanosis, clubbing, or rash. LEs and LUE wrapped. Decreased lower extremity edema. Diffuse gouty changes. Wounds on legs  NEUROLOGIC: Intubated. Follows  commands. Moves all 4s SKIN: Sacrum with unstageable pressure ulcer. Wounds on LEs   Telemetry   Personally reviewed, NSR with occasional V-pacing  Labs   Basic Metabolic Panel:  Recent Labs Lab 06/06/17 0900  06/07/17 0306 06/08/17 0237 06/09/17 0317 06/10/17 0153 06/11/17 0224  NA  --   < > 151* 150* 153* 153* 156*  K  --   < > 5.0 4.3 4.1 3.9 4.1  CL  --   < > 118* 118* 123* 122* 124*  CO2  --   < > 26 25 27 26 26   GLUCOSE  --   < > 178* 163* 111* 109* 116*  BUN  --   < > 80* 75* 71* 69* 63*  CREATININE  --   < > 1.81* 1.70* 1.51* 1.51* 1.39*  CALCIUM  --   < > 8.2* 8.2* 8.1* 8.3* 8.5*  MG 1.9  --   --   --  2.2  --   --   < > = values in this interval not displayed.  Liver Function Tests: No results for input(s): AST, ALT, ALKPHOS, BILITOT, PROT, ALBUMIN in the last 168 hours. No results for input(s): LIPASE, AMYLASE in the last 168 hours. No results for input(s): AMMONIA in the last 168 hours.  CBC:  Recent Labs Lab 06/07/17 0306 06/08/17 0237 06/09/17 0317 06/10/17 0153 06/11/17 0224  WBC 12.3* 13.2* 12.1* 12.7* 13.5*  HGB 9.4* 9.1* 8.4* 8.3* 7.7*  HCT 29.4* 28.7* 26.7* 26.5* 25.0*  MCV 88.3 88.3 89.0 91.1 91.2  PLT 317 312 307 295 289    INR:  Recent Labs Lab 06/07/17 0306 06/08/17 0237 06/09/17 0317 06/10/17 0153 06/11/17 0224  INR 1.56 1.52 1.52 1.50 1.39    Other results:     Imaging   No results found.   Medications:     Scheduled Medications: . amiodarone  200 mg Per Tube BID  . chlorhexidine gluconate (MEDLINE KIT)  15 mL Mouth Rinse BID  . Chlorhexidine Gluconate Cloth  6 each Topical Q0600  . colchicine  0.6 mg Oral Daily  . collagenase   Topical Daily  . febuxostat  120 mg Oral Daily  . feeding supplement (NEPRO CARB STEADY)  1,000 mL Per Tube Q24H  . feeding supplement (PRO-STAT SUGAR FREE 64)  60 mL Per Tube BID  . free water  200 mL Per Tube Q6H  . furosemide  40 mg Intravenous Once  . hydrALAZINE  37.5 mg Oral  Q8H  . insulin aspart  0-15 Units Subcutaneous Q4H  . levETIRAcetam  500 mg Per Tube BID  . levothyroxine  50 mcg Per NG tube QAC breakfast  . magnesium oxide  400 mg Per Tube  Daily  . mouth rinse  15 mL Mouth Rinse 10 times per day  . mexiletine  300 mg Per Tube Q12H  . pantoprazole sodium  40 mg Per Tube BID  . predniSONE  20 mg Per Tube Q breakfast  . sodium chloride flush  10-40 mL Intracatheter Q12H  . sotalol  80 mg Per Tube Daily    Infusions: . sodium chloride    . heparin 1,600 Units/hr (06/11/17 0615)  . meropenem (MERREM) IV Stopped (06/11/17 7017)  . morphine    . norepinephrine (LEVOPHED) Adult infusion Stopped (06/04/17 2250)  . sodium chloride Stopped (06/06/17 0335)  . vancomycin Stopped (06/10/17 1746)    PRN Medications: acetaminophen (TYLENOL) oral liquid 160 mg/5 mL, albuterol, docusate, fentaNYL (SUBLIMAZE) injection, Gerhardt's butt cream, hydrALAZINE, morphine, neomycin-bacitracin-polymyxin, ondansetron (ZOFRAN) IV, oxyCODONE, sodium chloride flush   Patient Profile   56 yo with CAD s/p CABG, ischemic cardiomyopathy/chronic systolic CHF, tophaceous gout, and CKD stage 3 was admitted for diuresis and consideration for LVAD placement. S/p HVAD on 6/28  Assessment/Plan:    1. Acute/chronic systolic CHF s/p HVAD: Ischemic cardiomyopathy.  St Jude ICD.  Echo (6/18) with EF 15%, mildly dilated RV with moderately decreased systolic function.  s/p HVAD placement 6/28 and had to return to OR to evacuate mediastinal hematoma.  He had been weaned off pressors/milrinone, but developed ileus w/ likely aspiration event 7/4, re-intubated, developed afib/RVR requiring norepinephrine. Extubated 7/6.  VAD speed turned down to 2540 with VT. VAD parameters currently look good.  Milrinone stopped 7/17 with recurrent VT.  He was started back on norepinephrine 8/9 due to soft BP after intubation and suspected septic shock, now off norepinephrine.  MAP 80s-low 90s, hydralazine po  started yesterday. Volume status fine centrally with CVP 7-8. Main issue is massive 3rd spacing but low intravascular volume. Has gotten numerous units of albumin without improvement.  Legs now wrapped and weight trending down.  - Increase hydralazine to 50 mg tid, want to see MAP 85 or below.   - Extremities wrapped with ACE wraps with some improvement in peripheral edema  - Plan for one-way extubation today.  Will give dose of Lasix 40 mg IV prior.  - Off ASA with GI Bleed  - Goal INR 2-2.5. Today's INR 1.39. Covered with heparin for now until definitely no tracheostomy => can likely start tonight if survives extubation.  2. AKI on CKD stage 3: Creatinine lower at 1.39.  3. Symptomatic anemia due to acute UGI bleeding:  Received 3 units PRBCs 7/16 and 3 units PRBCs on 05/07/2017. 1 unit PRBCs 7/20. 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  Requiring 2 clips + epi + APC. 1 unit PRBCs 8/6.  He had 1 unit PRBCs 8/10 to provide volume.  Slow down-trend in hgb, now 7.7.    - Got Feraheme 7/28.  - Continue Protonix 40 po bid. - Off aspirin for now.  - Will give 1 unit PRBCs with Lasix today.  4. Ventricular tachycardia: Status post ventricular tachycardia 2 on 7/13. Possible suction event. VAD speed turned down to 2700.  Recurrent VT 7/17. EP called to bedside. Overdrive pacing successful for about 10 minutes but VT recurred.  Milrinone turned off, remained in VT overnight but hemodynamically stable.  Speed decreased to 2600.  On 7/18, we were able to pace him out of VT.  Lidocaine added then stopped due to confusion/mental status changes.  He had VT 7/22 about 30 minutes after getting  IV Lasix, had to be paced out again => may have been due to rapid fluid shift.  Pt paced out of VT 3 separate occasions 7/25. Got amio bolus x 2 and started back on IV infusion.  Transitioned to amiodarone 400 mg tid.  On 8/1 had recurrent VT and paced out again.  Recurrent VT 8/2 per EP  sotalol started along with amiodarone, sotalol decreased to once daily with bradycardia.  - Continue mexiletine to 300 mg BID, sotalol 80 mg daily, amio 200 mg BID. - Remains quiescent. Continue to follow closely.  - Keep K > 4.0 Mg > 2.0 5. Paroxysmal Atrial fibrillation: Developed atrial fibrillation with RVR in setting of aspiration PNA on 7/4.  In NSR currently.  6. Malnutrition:  Continue tube feeds via Cor-track. Nutrition following. 7. Aspiration PNA with acute hypoxemic respiratory failure on 7/4: He was extubated on 7/6. Tracheal aspirate with Klebsiella and citrobacter. Both sensitive to Zosyn. Completed abx 7/13. Possible aspiration 8/8.  - Now on vancomycin/meropenema.    8. Acute Respiratory Failure: Intubated 7/17 in setting of complex GI intervention. Extubated 7/18. Re-intubated 8/8.  Weak respiratory muscles. He has not wanted a tracheostomy.  He was able to wean all day again at [facility] 8/5.  Plan is for one-way extubation today, this may well be a terminal event though weaning yesterday was more optimistic. Will have morphine gtt available if he does not do well after extubation.    9. CAD: s/p CABG 2012. No chest pain.  Off aspirin due to GI bleeding. No change.   10. Gout: Severe tophaceous gout.  - Continue uloric. No change.  11. Delirium: Resolved. No further, though assessment limited back on Vent.  Appreciate neuro input Possibly related to lidocaine.  Lidocaine stopped. CT of head negative. EEG ok. No further workup per neuro. Neuro  reconsulted 8/7 with AMS => ?related to fever/infection.  He has been more clear on vent.  CT head 05/27/17 and 8/10 with lacunar infarcts but do not appear acute.  Seizure activity versus myoclonus => Depakote started.  Switched to [place] with ?drug rash from Depakote.   12. UTI: on vancomycin and meropenum. Urine culture with no growth 13. Unstageable Pressure Ulcer: Sacrum. Consult WOC - Needs to start santyl daily to enzymatically debride.  Reposition R to L. WOC recommendations appreciated.     - Plan for hydrotherapy if he survives extubation.  14. Bradycardia: With hypotension.  Currently back-up pacing set at 60 bpm.  Does not have atrial lead so pacing asynchronously.  Currently, on and off pacing at rate of 60.    15. Hypothyroidism: May be related to amiodarone, he is on Levoxyl. No change.   16. ID: Suspected septic shock, source = pressure ulcer versus UTI.  Afebrile now, WBCs coming down, BP improving.  Cultures so far negative.  He remains on vancomycin/meropenem.    17. Rash: ?Drug eruption.  Has not worsened and is faint.  Meropenem versus Depakote may be the culprits. - Depakote stopped and switched for Keppra per Neuro recommendation. Rash resolved 18. Deconditioning:  Extremely severe. Guarded long-term prognosis.  19. Hypernatremia: Free water boluses, increase to 200 mg every 6 hrs.    I reviewed the HVAD parameters from today, and compared the results to the patient's prior recorded data.  No programming changes were made.  The HVAD is functioning within specified parameters.    CRITICAL CARE Performed by: Loralie Champagne  Total critical care time: 40 minutes  Critical care time  was exclusive of separately billable procedures and treating other patients.  Critical care was necessary to treat or prevent imminent or life-threatening deterioration.  Critical care was time spent personally by me on the following activities: development of treatment plan with patient and/or surrogate as well as nursing, discussions with consultants, evaluation of patient's response to treatment, examination of patient, obtaining history from patient or surrogate, ordering and performing treatments and interventions, ordering and review of laboratory studies, ordering and review of radiographic studies, pulse oximetry and re-evaluation of patient's condition.  Loralie Champagne, MD 06/11/2017, 8:02 AM  VAD Team --- VAD ISSUES  ONLY--- Pager [phone] (7am - 7am) Advanced Heart Failure Team  Pager [phone] (M-F; 7a - 4p)  Please contact [facility] for night-coverage after hours (4p -7a ) and weekends on [email or web address]

## 2017-06-11 NOTE — Progress Notes (Addendum)
ANTICOAGULATION CONSULT NOTE - Follow Up Consult  Pharmacy Consult for Heparin  + Coumadin Indication: HVAD  No Known Allergies  Patient Measurements: Height: 6' (182.9 cm) Weight: 234 lb 5.6 oz (106.3 kg) IBW/kg (Calculated) : 77.6  Vital Signs: Temp: 98.6 F (37 C) (08/22 1045) Temp Source: Axillary (08/22 1045) BP: 97/70 (08/22 1000) Pulse Rate: 61 (08/22 1047)  Labs:  Recent Labs  06/09/17 0317 06/10/17 0153 06/11/17 0224  HGB 8.4* 8.3* 7.7*  HCT 26.7* 26.5* 25.0*  PLT 307 295 289  LABPROT 18.4* 18.3* 17.2*  INR 1.52 1.50 1.39  HEPARINUNFRC 0.39 0.39 0.37  CREATININE 1.51* 1.51* 1.39*    Estimated Creatinine Clearance: 76.6 mL/min (A) (by C-G formula based on SCr of 1.39 mg/dL (H)).  Assessment: 56 y/o M continues on heparin s/p HVAD on 04/03/2017.  No overt bleeding noted, CBC stable   Heparin level 0.37 this am within goal range on heparin drip rate 1600 uts/hr.  Pharmacy asked to resume Coumadin this evening.  Goal of Therapy:  Heparin level 0.3-0.5 units/mL Monitor platelets by anticoagulation protocol: Yes   Plan:  Continue heparin drip 1600 uts/hr Daily heparin level and CBC. Coumadin 1 mg x 1 tonight. Daily INR.  Tad Moore, BCPS  Clinical Pharmacist Pager [phone]  06/11/2017 10:56 AM

## 2017-06-11 NOTE — Progress Notes (Signed)
Pharmacy Antibiotic Note  James [name] is a 56 y.o. male being treated for sepsis secondary to sacral wound vs UTI. Pharmacy has been consulted for vancomycin and meropenem dosing.   Rash resolved with switching Depakote to Keppra  Plan: -Continue vancomycin 750 mg IV q 24 hrs for now - will need trough level soon.  -Meropenem to 1g IV q 8hrs given improving renal function.  -Monitor SCr, UOP, fever curve, cultures -Could antibiotics be de-escalated soon?   Height: 6' (182.9 cm) Weight: 234 lb 5.6 oz (106.3 kg) IBW/kg (Calculated) : 77.6  Temp (24hrs), Avg:98.4 F (36.9 C), Min:98.1 F (36.7 C), Max:98.9 F (37.2 C)   Recent Labs Lab 06/06/17 1230 06/07/17 0306 06/07/17 1357 06/08/17 0237 06/09/17 0317 06/10/17 0153 06/11/17 0224  WBC  --  12.3*  --  13.2* 12.1* 12.7* 13.5*  CREATININE 1.95* 1.81*  --  1.70* 1.51* 1.51* 1.39*  VANCOTROUGH 15  --   --   --   --   --   --   VANCORANDOM  --   --  17  --   --   --   --     Estimated Creatinine Clearance: 76.6 mL/min (A) (by C-G formula based on SCr of 1.39 mg/dL (H)).    No Known Allergies  Antimicrobials this admission: 7/5 Vanc >>7/8 7/5 Zosyn >> 7/12 8/7>8/8 ctx 8/8 vanc>> 8/8 merrem>>  Dose adjustments this admission: 7/7 VT 39 8/12 VT 30 - hold 8/13 VR 30 - hold 8/14 VR 25 - hold 8/15 VR 18>>750 x1 8/17 VR 15 (48 hrs after dose) > 750 mg x 1 8/18 VR 17 (24 hrs after dose) > 750 mg q 24 hrs  Microbiology results: 7/5 blood x2>> neg 7/5 TA > rare GNR>>reincubate>few klebsiella/citrobacter - pan sensitive 7/7 TA>rare gnr/gpc>>kleb pneumo - r amp otherwise pan sensitive 7/26 MRSA pcr NEG !!>>precautions d/c'ed after 30d 8/7 bld>>ngtd 8/7 urine>>ng 8/8 TA>>(lots of secretions on intubation per ccm) rare gpc/gpr 8/9 urine >ng 8/11 BCx > ngF 8/11 TA > normal flora 8/12 BCx>neg   Thank you for allowing pharmacy to be a part of this patient's care.  Tad Moore, BCPS  Clinical  Pharmacist Pager [phone]  06/11/2017 10:47 AM

## 2017-06-11 NOTE — Progress Notes (Signed)
LVAD Inpatient Coordinator Rounding Note:  Admitted 04/10/2017 due to A/C Heart failure.   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 Laneta Simmers as DT VAD.  Transferred to 4E 05/17/17.  Transferred back to ICU 2H09 on 05/28/17 with possible aspiration pneumonia requiring re-intubation.   Vital signs: Temp: 98.9 HR: 65 Doppler: 90 Auto BP: 108/79 (86) O2 Sat: 99% on 30% FIO2 and 5 peep Wt in lbs: Marland Kitchen.. 230>229>226>224>235>221>217>223>224>224>234>240>245>235>234  LVAD interrogation reveals:  Speed: 2540 Flow: 3.8 Power: 3.3 w Alarms: suction alarm now OFF  Peak:  6.7 Trough: 1.3 HCT: 25 Low flow alarm setting: 2.5 High watt alarm setting: 5.5  Suction: OFF  Lavare cycle: on  Blood Products: 6/28> 5 PRBC's, 6 FFP 7/1> 1 PRBC 7/9> 1 PRBC 7/13>3 PRBC 7/14>3 PRBC 7/15> 1 PRBC 7/16>3PRBC 7/17> 3 PRBC 7/20> 1 PRBC 8/6> 1 PRBC 8/10 > 1 PRBC  Gtts: Milrinone restarted 7/8, stopped 7/16 Levo stopped 04/26/17 restarted 05/03/2017 - stopped 05/07/17 Amiodarone stopped 04/29/17, restarted 7/17 - transitioned to PO 05/13/17; VT on 05/15/17 - IV amio re-started - 30 mg/hr stopped 05/16/17 Heparin 2200 u/hr Lidocaine 05/07/17 - stopped 7/19  TPN - started 04/24/17 for nutritional support, stopped 04/30/17  NG inserted with tube feeding started 05/27/17; placed on hold 05/28/17   Cortrak inserted 06/07/17 - started TF 05/1917   Neuro: 05/27/17- Neurology consult for lethargy, mouth tremors, not vocal, not following commands> Head CT and EEG 05/28/17 - Continual EEG monitoring  05/30/17 - started Depakote for seizure activity - stopped 06/03/17 for rash - will start Keppra   Arrhythmia: 04/24/17 - Afib with RVR - started amiodarone 05/02/17 - 2 sustained episodes of VT - cardioverted x 1; second broke with overdrive pacing 7/17- wide complex tachycardia- Amio bolus x3 and gtt at 60 mg/hr 05/07/17 - sustained VT - converted with overdrive pacing; Lidocaine started in addition to  amiodarone 05/08/17 - Lidocaine stopped due to confusion (lido level 12.4) 05/14/17 - sustained VT - converted 3 separate occassions with overdrive pacing 05/19/17 - sustained VT-converted with overdrive pacing  05/26/17- Afib rate in the 50's 05/27/17 - NSR   Respiratory: 04/23/17 - re-intubated due to respiratory failure secondary to suspected aspiration pneumonia 04/25/17-extubated 04/20/2017- intubated for EGD 05/16/2017- extubated 05/28/17 - re-intubated for suspected aspiration pneumonia  Drive Line: Daily Dressing Kits with Aquacell AG silver strips per protocol - will advance to every other day dressing changes.  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 He had epinephrine, APC,    and 2 clips.  - 7/19 - Lidocaine gtt stopped due to confusion (lido toxicity) -8/7- Neurology consult- lethargy, mouth tremors, not vocal, not following commands> Head CT and EEG, blood and urine    cultures sent, Ammonia-20 8/8 - re-intubated for possible aspiration pneumonia   Plan/Recommendations:  1. Currently every other day dressing changes. 2. Plan for one-way extubation today when family ready.  3. Please page VAD pager for any equipment or patient needs.   Marcellus Scott RN, VAD Coordinator 24/7 pager [phone]

## 2017-06-11 NOTE — Progress Notes (Signed)
Drive line dressing changed using sterile technique by wife Elease Hashimoto, spoke with patient and wife about daily sacral dressing, both would prefer to wait until tomorrow 8/23 for daily sacral dressing change, will pass on in report.  Hermina Barters, RN

## 2017-06-11 NOTE — Plan of Care (Signed)
  Interdisciplinary Goals of Care Family Meeting   Date carried out:: 06/11/2017  Location of the meeting: Bedside  Member's involved: Physician, Bedside Registered Nurse, Respiratory Therapist and Family Member or next of kin  Durable Power of Attorney or acting medical decision maker: Wife     Discussion: We discussed goals of care for James [name] .  Wife and family all at bedside. Notified by nurse that they are ready for extubation. Morphine drip is available and primed if needed for palliation. I have also ordered Versed & Morphine push IV as needed in the meantime for palliation if necessary. Hospital chaplain has arrived. Cardiology/LVAD service is at bedside. Proceding with one-way extubation when ready. Palliative Medicine Service is also at bedside.   Code status: Full DNR  Disposition: Continue current acute care  Time spent for the meeting: 7 minutes   Lawanda Cousins 06/11/2017, 2:32 PM

## 2017-06-11 NOTE — Progress Notes (Signed)
CSW met at bedside with wife and many family members to provide support as patient was extubated. Patient awake, alert and oriented acknowledged wife when asked if he was ready for extubation. Wife appears to be comfortable with decision and supporting patient through process of extubation. CSW continues to be available and provide support as needed during this difficult time and lengthy hospitalization. Raquel Sarna, [place], [place]

## 2017-06-12 ENCOUNTER — Inpatient Hospital Stay (HOSPITAL_COMMUNITY): Payer: Medicare PPO

## 2017-06-12 DIAGNOSIS — I5020 Unspecified systolic (congestive) heart failure: Secondary | ICD-10-CM

## 2017-06-12 DIAGNOSIS — I509 Heart failure, unspecified: Secondary | ICD-10-CM

## 2017-06-12 LAB — BASIC METABOLIC PANEL
ANION GAP: 3 — AB (ref 5–15)
BUN: 59 mg/dL — AB (ref 6–20)
CO2: 26 mmol/L (ref 22–32)
Calcium: 8 mg/dL — ABNORMAL LOW (ref 8.9–10.3)
Chloride: 122 mmol/L — ABNORMAL HIGH (ref 101–111)
Creatinine, Ser: 1.48 mg/dL — ABNORMAL HIGH (ref 0.61–1.24)
GFR calc Af Amer: >60 mL/min (ref >60)
GFR calc non Af Amer: 52 mL/min — ABNORMAL LOW (ref >60)
GLUCOSE: 136 mg/dL — AB (ref 65–99)
POTASSIUM: 4 mmol/L (ref 3.5–5.1)
Sodium: 151 mmol/L — ABNORMAL HIGH (ref 135–145)

## 2017-06-12 LAB — TYPE AND SCREEN
ABO/RH(D): A POS
ANTIBODY SCREEN: NEGATIVE
Unit division: 0

## 2017-06-12 LAB — CBC
HEMATOCRIT: 29.7 % — AB (ref 39.0–52.0)
HEMOGLOBIN: 8.9 g/dL — AB (ref 13.0–17.0)
MCH: 27.3 pg (ref 26.0–34.0)
MCHC: 30 g/dL (ref 30.0–36.0)
MCV: 91.1 fL (ref 78.0–100.0)
Platelets: 275 10*3/uL (ref 150–400)
RBC: 3.26 MIL/uL — ABNORMAL LOW (ref 4.22–5.81)
RDW: 19.6 % — ABNORMAL HIGH (ref 11.5–15.5)
WBC: 12.5 10*3/uL — ABNORMAL HIGH (ref 4.0–10.5)

## 2017-06-12 LAB — PROTIME-INR
INR: 1.43
Prothrombin Time: 17.6 seconds — ABNORMAL HIGH (ref 11.4–15.2)

## 2017-06-12 LAB — COOXEMETRY PANEL
Carboxyhemoglobin: 1.9 % — ABNORMAL HIGH (ref 0.5–1.5)
Methemoglobin: 0.9 % (ref 0.0–1.5)
O2 Saturation: 89.3 %
Total hemoglobin: 9.3 g/dL — ABNORMAL LOW (ref 12.0–16.0)

## 2017-06-12 LAB — BPAM RBC
Blood Product Expiration Date: 201808292359
ISSUE DATE / TIME: 201808221036
Unit Type and Rh: 6200

## 2017-06-12 LAB — GLUCOSE, CAPILLARY
GLUCOSE-CAPILLARY: 118 mg/dL — AB (ref 65–99)
GLUCOSE-CAPILLARY: 131 mg/dL — AB (ref 65–99)
Glucose-Capillary: 126 mg/dL — ABNORMAL HIGH (ref 65–99)
Glucose-Capillary: 193 mg/dL — ABNORMAL HIGH (ref 65–99)
Glucose-Capillary: 192 mg/dL — ABNORMAL HIGH (ref 65–99)
Glucose-Capillary: 161 mg/dL — ABNORMAL HIGH (ref 65–99)

## 2017-06-12 LAB — BRAIN NATRIURETIC PEPTIDE: B Natriuretic Peptide: 3516.1 pg/mL — ABNORMAL HIGH (ref 0.0–100.0)

## 2017-06-12 LAB — HEPARIN LEVEL (UNFRACTIONATED): HEPARIN UNFRACTIONATED: 0.3 [IU]/mL (ref 0.30–0.70)

## 2017-06-12 LAB — LACTATE DEHYDROGENASE: LDH: 148 U/L (ref 98–192)

## 2017-06-12 MED ORDER — WARFARIN SODIUM 1 MG PO TABS
1.0000 mg | ORAL_TABLET | Freq: Once | ORAL | Status: AC
  Administered 2017-06-12: 1 mg via ORAL
  Filled 2017-06-12: qty 1

## 2017-06-12 NOTE — Progress Notes (Signed)
Physical Therapy Treatment Patient Details Name: James [name] MRN: [id number] DOB: 1961-02-15 Today's Date: 06/12/2017    History of Present Illness  Pt adm with acute on chronic heart failure and underwent Heartware HVAD implant on 6/28. Returned to OR later that day for evacuation of mediastinal hematoma. On 7/4 developed ileus with likely aspiration and intubated 7/4-7/6.   Pt developed Heme + stools with progressive anemia as well as  James [name] 05/02/17.  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 He was intubated for procedure and extubated 05/08/17 PMH - gout, DMII, HTN, CKD, CAD S/P CABGx6 2012, chronic systolic heart failure and St jude ICD.     PT Comments    Pt admitted with above diagnosis. Pt currently with functional limitations due to balance, strength and endurance deficits. Pt was able to exercise with PT all 4 extremities.  Offered EOB and pt did not want to do so today. Will follow acutely.  Pt will benefit from skilled PT to increase their independence and safety with mobility to allow discharge to the venue listed below.     Follow Up Recommendations  SNF;Supervision/Assistance - 24 hour     Equipment Recommendations  Other (comment) (TBD next venue)    Recommendations for Other Services       Precautions / Restrictions Precautions Precautions: Sternal Precaution Comments: Heartware HVAD Restrictions Weight Bearing Restrictions: No Other Position/Activity Restrictions: Sternal precautions    Mobility  Bed Mobility                  Transfers                    Ambulation/Gait                 Stairs            Wheelchair Mobility    Modified Rankin (Stroke Patients Only)       Balance                                            Cognition Arousal/Alertness: Awake/alert Behavior During Therapy: WFL for tasks assessed/performed;Flat affect Overall Cognitive Status: Difficult to assess Area of  Impairment: Following commands;Awareness;Problem solving                   Current Attention Level: Focused Memory: Decreased recall of precautions Following Commands: Follows one step commands with increased time;Follows one step commands consistently Safety/Judgement: Decreased awareness of safety Awareness: Intellectual Problem Solving: Slow processing;Decreased initiation;Requires verbal cues;Requires tactile cues General Comments: Shakes head to yes no questons      Exercises General Exercises - Upper Extremity Shoulder Flexion: Right;10 reps;Supine;Left;Both;AAROM Shoulder ABduction: Supine;Right;Left;Both;10 reps;AAROM Shoulder Horizontal ABduction: Right;10 reps;AROM;Supine Theraband Level (Shoulder Horizontal Abduction): Level 1 (Yellow) Elbow Flexion: AROM;10 reps;Supine;Both Wrist Flexion: AROM;5 reps;Supine;Both Wrist Extension: AROM;Supine;Both;10 reps Digit Composite Flexion: AROM;5 reps;Supine;Both Composite Extension: AROM;5 reps;Supine;Both General Exercises - Lower Extremity Ankle Circles/Pumps: AROM;10 reps;Both;Supine (resisted movement) Quad Sets: AROM;Both;Supine;10 reps Heel Slides: AROM;Both;AAROM;Supine;10 reps Hip ABduction/ADduction: AROM;Both;Supine;10 reps    General Comments General comments (skin integrity, edema, etc.): VSS with exercise      Pertinent Vitals/Pain Pain Assessment: Faces Faces Pain Scale: Hurts whole lot Pain Location: 4 extremities Pain Descriptors / Indicators: Grimacing;Sore;Guarding Pain Intervention(s): Limited activity within patient's tolerance;Monitored during session;Premedicated before session;Repositioned    Home Living  Prior Function            PT Goals (current goals can now be found in the care plan section) Progress towards PT goals: Progressing toward goals    Frequency    Min 3X/week      PT Plan Current plan remains appropriate    Co-evaluation               AM-PAC PT "6 Clicks" Daily Activity  Outcome Measure  Difficulty turning over in bed (including adjusting bedclothes, sheets and blankets)?: Unable Difficulty moving from lying on back to sitting on the side of the bed? : Unable Difficulty sitting down on and standing up from a chair with arms (e.g., wheelchair, bedside commode, etc,.)?: Unable Help needed moving to and from a bed to chair (including a wheelchair)?: Total Help needed walking in hospital room?: Total Help needed climbing 3-5 steps with a railing? : Total 6 Click Score: 6    End of Session   Activity Tolerance: Patient limited by fatigue;Patient limited by pain Patient left: with call bell/phone within reach;in bed;with bed alarm set;with family/visitor present Nurse Communication: Mobility status PT Visit Diagnosis: Muscle weakness (generalized) (M62.81);Difficulty in walking, not elsewhere classified (R26.2);Other abnormalities of gait and mobility (R26.89);Pain Pain - Right/Left: Left Pain - part of body: Knee;Leg;Shoulder;Arm;Hand;Ankle and joints of foot;Hip     Time: 9311-2162 PT Time Calculation (min) (ACUTE ONLY): 20 min  Charges:  $Therapeutic Exercise: 8-22 mins                    G Codes:       Demon Volante,PT Acute Rehabilitation [phone] (pager)    Berline Lopes 06/12/2017, 2:34 PM

## 2017-06-12 NOTE — Progress Notes (Signed)
ANTICOAGULATION CONSULT NOTE - Follow Up Consult  Pharmacy Consult for Heparin  + Coumadin Indication: HVAD  No Known Allergies  Patient Measurements: Height: 6' (182.9 cm) Weight: 234 lb 2.1 oz (106.2 kg) IBW/kg (Calculated) : 77.6  Vital Signs: Temp: 98.5 F (36.9 C) (08/23 0900) Temp Source: Oral (08/23 0900) BP: 114/90 (08/23 1000) Pulse Rate: 69 (08/23 1000)  Labs:  Recent Labs  06/10/17 0153 06/11/17 0224 06/12/17 0713  HGB 8.3* 7.7* 8.9*  HCT 26.5* 25.0* 29.7*  PLT 295 289 275  LABPROT 18.3* 17.2* 17.6*  INR 1.50 1.39 1.43  HEPARINUNFRC 0.39 0.37 0.30  CREATININE 1.51* 1.39* 1.48*    Estimated Creatinine Clearance: 71.8 mL/min (A) (by C-G formula based on SCr of 1.48 mg/dL (H)).  Assessment: 56 y/o M continues on heparin s/p HVAD on 03/31/2017.  No overt bleeding noted, CBC stable   Heparin level 0.3 this am within goal range on heparin drip rate 1600 uts/hr.  Pharmacy asked to resume Coumadin yesterday.  Has been very sensitive to Coumadin earlier this admission.  Goal of Therapy:  Heparin level 0.3-0.5 units/mL Monitor platelets by anticoagulation protocol: Yes   Plan:  Continue heparin drip 1600 uts/hr Daily heparin level and CBC. Coumadin 1 mg x 1 again tonight. Daily INR.  Tad Moore, BCPS  Clinical Pharmacist Pager [phone]  06/12/2017 10:53 AM

## 2017-06-12 NOTE — Progress Notes (Addendum)
Palliative:  [name] is sleeping during my visit. Per RN he was scared to sleep last night that he would not wake up again. James [name] is happy he is sleeping and says this is their goal today to let him rest. James [name] is hopeful but also able to tell me they are at peace with whatever happens. James [name] did briefly awaken and denies discomfort or concern.   She says that he was able to share with her yesterday details that he desires at his funeral. She is also happy that he has been clear in his desire for no trach and that they are following his wishes. They are happy to have this time with him and hopeful for as much time as they can get.   Emotional support and listening provided. They continue to hope for as much time as they can get but also prepared for transition to comfort if he declines.   15 min  Yong Channel, NP Palliative Medicine Team Pager # [phone] (M-F 8a-5p) Team Phone # [phone] (Nights/Weekends)

## 2017-06-12 NOTE — Progress Notes (Signed)
Patient ID: James [name], male   DOB: 03/20/1961, 56 y.o.   MRN: [id number]   Advanced Heart Failure VAD Team Note  Subjective:    Events: --HVAD placed 6/28.  Returned to the OR that evening with high chest tube output, evacuation of mediastinal hematoma.  --Extubated 6/29. Milrinone stopped on 7/4. --7/4, patient developed ileus with respiratory compromise. NGT placed with 3 L suctioned out.  CXR with suspicion for aspiration PNA.  Patient had to be intubated.  He went into atrial fibrillation with RVR.  He became hypotensive and was started on norepinephrine and phenylephrine.  Amiodarone gtt begun.    --Extubated again on 7/6.  -- 7/13 Had 2 sustained episodes of VT. Had to be cardioverted 1. Second episode broke with overdrive pacing with Dr. Caryl Comes. VAD speed turned down to 2700.  --Melena due to acuteGI bleed --> 2 units PRBCs 7/14, 2 units 7/15. 3 U PRBCs 7/16,  05/18/2017 3 UPRBCs.  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  He had epinephrine, APC, and 2 clips. Intubated prior to procedure and placed on norepinephrine. Speed dropped to 2660.  --7/18 Back in VT with overdrive pacing. Lidocaine was started in addition to amiodarone. Extubated. --7/19 lidocaine stopped with confusion. Neuro consulted.  EEG normal.  CT of head no acute findings. 7/20 confusion had resolved.  --1 unit PRBCs 7/20.  --7/22, developed sustained VT with rate around 130 shortly after getting IV Lasix.  We were able to pace him out and back to NSR.  He was put back on amiodarone gtt.  --More VT on 7/25, paced out.  Back on IV amiodarone gtt.  NSR.  -- 7/31 Ramp ECHO decrease speed 2600 rpm.  -- 8/1 VT- paced out -- 8/2 VT - started on sotalol -- 8/6 Hgb 7.5, 1 unit PRBCs -- 8/7 Hgb 9.5, confused.  Head CT with right superior cerebellar artery lacunar infarcts, not acute. -- 8/8 Possible aspiration. Reintubated. -- 8/10 possible seizure => Depakote.  CT head no  change.  1 unit PRBCs.  -- Multiple suction alarms on 8/17 night which persisted so suction alarm deactivated.  -- Extubated 8/23  He is awake and alert this morning, had a stable night.  No labs are back yet.  CVP 7 today.  MAP 80s-90.   Remains on heparin gtt + warfarin.    Afebrile.   HVAD INTERROGATION:  HVAD:  Flow 4.1 liters/min, speed 2540,  power 3.3 W,  Peak 7 Trough 2. Suction alarms off. Lavare On.     Objective:    Vital Signs:   Temp:  [97.4 F (36.3 C)-98.9 F (37.2 C)] 98.1 F (36.7 C) (08/23 0400) Pulse Rate:  [59-72] 67 (08/23 0700) Resp:  [14-25] 20 (08/23 0700) BP: (77-122)/(52-90) 105/84 (08/23 0700) SpO2:  [93 %-100 %] 96 % (08/23 0700) FiO2 (%):  [30 %] 30 % (08/22 0732) Weight:  [234 lb 2.1 oz (106.2 kg)] 234 lb 2.1 oz (106.2 kg) (08/23 0500) Last BM Date: 06/11/17 Mean arterial Pressure 80s-90 Intake/Output:   Intake/Output Summary (Last 24 hours) at 06/12/17 0726 Last data filed at 06/12/17 0700  Gross per 24 hour  Intake          3121.17 ml  Output             3020 ml  Net           101.17 ml     Physical Exam    Physical Exam:  GENERAL: NAD HEENT: Cor-track. NECK: Supple, JVP 7  CARDIAC: Mechanical heart sounds LVAD hum present  LUNGS: Clear anteriorly ABDOMEN: NT, ND, no HSM. No bruits or masses. Hypoactive BSBS  LVAD exit site: Dressing dry and intact. No erythema or drainage. Stabilization device present and accurately applied. Driveline dressing changed daily per sterile technique. EXTREMITIES: Warm and dry. No cyanosis, clubbing, or rash. LEs wrapped. Decreased lower extremity edema. Diffuse gouty changes. Wounds on legs  NEUROLOGIC: Alert, follow commands SKIN: Sacrum with unstageable pressure ulcer. Wounds on LEs   Telemetry   Personally reviewed, NSR with occasional V-pacing  Labs   Basic Metabolic Panel:  Recent Labs Lab 06/06/17 0900  06/07/17 0306 06/08/17 0237 06/09/17 0317 06/10/17 0153 06/11/17 0224    NA  --   < > 151* 150* 153* 153* 156*  K  --   < > 5.0 4.3 4.1 3.9 4.1  CL  --   < > 118* 118* 123* 122* 124*  CO2  --   < > _0 GLUCOSE  --   < > 178* 163* 111* 109* 116*  BUN  --   < > 80* 75* 71* 69* 63*  CREATININE  --   < > 1.81* 1.70* 1.51* 1.51* 1.39*  CALCIUM  --   < > 8.2* 8.2* 8.1* 8.3* 8.5*  MG 1.9  --   --   --  2.2  --   --   < > = values in this interval not displayed.  Liver Function Tests: No results for input(s): AST, ALT, ALKPHOS, BILITOT, PROT, ALBUMIN in the last 168 hours. No results for input(s): LIPASE, AMYLASE in the last 168 hours. No results for input(s): AMMONIA in the last 168 hours.  CBC:  Recent Labs Lab 06/07/17 0306 06/08/17 0237 06/09/17 0317 06/10/17 0153 06/11/17 0224  WBC 12.3* 13.2* 12.1* 12.7* 13.5*  HGB 9.4* 9.1* 8.4* 8.3* 7.7*  HCT 29.4* 28.7* 26.7* 26.5* 25.0*  MCV 88.3 88.3 89.0 91.1 91.2  PLT 317 312 307 295 289    INR:  Recent Labs Lab 06/07/17 0306 06/08/17 0237 06/09/17 0317 06/10/17 0153 06/11/17 0224  INR 1.56 1.52 1.52 1.50 1.39    Other results:     Imaging   No results found.   Medications:     Scheduled Medications: . amiodarone  200 mg Per Tube BID  . chlorhexidine gluconate (MEDLINE KIT)  15 mL Mouth Rinse BID  . Chlorhexidine Gluconate Cloth  6 each Topical q morning - 10a  . colchicine  0.6 mg Oral Daily  . collagenase   Topical Daily  . febuxostat  120 mg Oral Daily  . feeding supplement (NEPRO CARB STEADY)  1,000 mL Per Tube Q24H  . feeding supplement (PRO-STAT SUGAR FREE 64)  60 mL Per Tube BID  . free water  200 mL Per Tube Q6H  . hydrALAZINE  50 mg Oral Q8H  . insulin aspart  0-15 Units Subcutaneous Q4H  . levETIRAcetam  500 mg Per Tube BID  . levothyroxine  50 mcg Per NG tube QAC breakfast  . magnesium oxide  400 mg Per Tube Daily  . mouth rinse  15 mL Mouth Rinse Q4H  . mexiletine  300 mg Per Tube Q12H  . pantoprazole sodium  40 mg Per Tube BID  . predniSONE  20 mg  Per Tube Q breakfast  . sodium chloride flush  10-40 mL Intracatheter Q12H  . sotalol  80 mg Per Tube Daily  .  Warfarin - Pharmacist Dosing Inpatient   Does not apply q1800    Infusions: . heparin 1,600 Units/hr (06/12/17 0700)  . meropenem (MERREM) IV 1 g (06/12/17 0530)  . morphine    . norepinephrine (LEVOPHED) Adult infusion Stopped (06/04/17 2250)  . sodium chloride Stopped (06/06/17 0335)  . vancomycin Stopped (06/11/17 1609)    PRN Medications: acetaminophen (TYLENOL) oral liquid 160 mg/5 mL, albuterol, docusate, fentaNYL (SUBLIMAZE) injection, Gerhardt's butt cream, hydrALAZINE, midazolam, morphine injection, morphine, neomycin-bacitracin-polymyxin, ondansetron (ZOFRAN) IV, oxyCODONE, sodium chloride flush   Patient Profile   56 yo with CAD s/p CABG, ischemic cardiomyopathy/chronic systolic CHF, tophaceous gout, and CKD stage 3 was admitted for diuresis and consideration for LVAD placement. S/p HVAD on 6/28  Assessment/Plan:    1. Acute/chronic systolic CHF s/p HVAD: Ischemic cardiomyopathy.  St Jude ICD.  Echo (6/18) with EF 15%, mildly dilated RV with moderately decreased systolic function.  s/p HVAD placement 6/28 and had to return to OR to evacuate mediastinal hematoma.  He had been weaned off pressors/milrinone, but developed ileus w/ likely aspiration event 7/4, re-intubated, developed afib/RVR requiring norepinephrine. Extubated 7/6.  VAD speed turned down to 2540 with VT. VAD parameters currently look good.  Milrinone stopped 7/17 with recurrent VT.  He was started back on norepinephrine 8/9 due to soft BP after intubation and suspected septic shock, now off norepinephrine.  MAP 80s-90, getting hydralazine per tube. Volume status fine centrally with CVP 7. Main issue is massive 3rd spacing but low intravascular volume.  Legs now wrapped and weight stable. - Can hold off on Lasix today.  - Continue hydralazine 50 mg tid for now, want to see MAP 85 or below.   - Extremities  wrapped with ACE wraps with some improvement in peripheral edema  - Off ASA with GI Bleed  - Goal INR 2-2.5. Pending INR today. Warfarin restarted, on heparin gtt with low INR.  2. AKI on CKD stage 3: Creatinine has been improved, pending today.  3. Symptomatic anemia due to acute UGI bleeding:  Received 3 units PRBCs 7/16 and 3 units PRBCs on 04/21/2017. 1 unit PRBCs 7/20. 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  Requiring 2 clips + epi + APC. 1 unit PRBCs 8/6.  He had 1 unit PRBCs 8/10 to provide volume.  1 unit PRBCs on 8/22.    - Got Feraheme 7/28.  - Continue Protonix 40 po bid. - Off aspirin for now.  - Awaiting CBC.  4. Ventricular tachycardia: Status post ventricular tachycardia 2 on 7/13. Possible suction event. VAD speed turned down to 2700.  Recurrent VT 7/17. EP called to bedside. Overdrive pacing successful for about 10 minutes but VT recurred.  Milrinone turned off, remained in VT overnight but hemodynamically stable.  Speed decreased to 2600.  On 7/18, we were able to pace him out of VT.  Lidocaine added then stopped due to confusion/mental status changes.  He had VT 7/22 about 30 minutes after getting IV Lasix, had to be paced out again => may have been due to rapid fluid shift.  Pt paced out of VT 3 separate occasions 7/25. Got amio bolus x 2 and started back on IV infusion.  Transitioned to amiodarone 400 mg tid.  On 8/1 had recurrent VT and paced out again.  Recurrent VT 8/2 per EP sotalol started along with amiodarone, sotalol decreased to once daily with bradycardia.  - Continue mexiletine to 300 mg BID, sotalol 80 mg daily, amio  200 mg BID. - Remains quiescent. Continue to follow closely.  - Keep K > 4.0 Mg > 2.0 5. Paroxysmal Atrial fibrillation: Developed atrial fibrillation with RVR in setting of aspiration PNA on 7/4.  In NSR currently.  6. Malnutrition:  Continue tube feeds via Cor-track. Nutrition following. 7. Aspiration PNA with acute  hypoxemic respiratory failure on 7/4: He was extubated on 7/6. Tracheal aspirate with Klebsiella and citrobacter. Both sensitive to Zosyn. Completed abx 7/13. Possible aspiration 8/8.  - Now on vancomycin/meropenem.    8. Acute Respiratory Failure: Intubated 7/17 in setting of complex GI intervention. Extubated 7/18. Re-intubated 8/8.  Weak respiratory muscles. He has not wanted a tracheostomy.  Extubated on 8/22 and so far doing ok. 9. CAD: s/p CABG 2012. No chest pain.  Off aspirin due to GI bleeding. No change.   10. Gout: Severe tophaceous gout.  - Continue uloric. No change.  11. Delirium: Resolved. No further, though assessment limited back on Vent.  Appreciate neuro input Possibly related to lidocaine.  Lidocaine stopped. CT of head negative. EEG ok. No further workup per neuro. Neuro  reconsulted 8/7 with AMS => ?related to fever/infection.  He has been more clear on vent.  CT head 05/27/17 and 8/10 with lacunar infarcts but do not appear acute.  Seizure activity versus myoclonus => Depakote started.  Switched to [place] with ?drug rash from Depakote.   12. UTI: on vancomycin and meropenum. Urine culture with no growth 13. Unstageable Pressure Ulcer: Sacrum. Consult WOC - Needs to start santyl daily to enzymatically debride. Reposition R to L. WOC recommendations appreciated.     - Will need to start hydrotherapy today with PT.  14. Bradycardia: With hypotension.  Currently back-up pacing set at 60 bpm.  Does not have atrial lead so pacing asynchronously.  Currently, on and off pacing at rate of 60.    15. Hypothyroidism: May be related to amiodarone, he is on Levoxyl. No change.   16. ID: Suspected septic shock, source = pressure ulcer versus UTI.  Afebrile now, WBCs have come down, hypotension resolved.  Cultures so far negative.  He remains on vancomycin/meropenem.    17. Rash: ?Drug eruption.  Has not worsened and is faint.  Meropenem versus Depakote may be the culprits. - Depakote stopped and  switched for Keppra per Neuro recommendation. Rash resolved 18. Deconditioning:  Extremely severe. Guarded long-term prognosis.  19. Hypernatremia: Free water boluses,  200 mg every 6 hrs.  Awaiting BMET.   I reviewed the HVAD parameters from today, and compared the results to the patient's prior recorded data.  No programming changes were made.  The HVAD is functioning within specified parameters.    Loralie Champagne, MD 06/12/2017, 7:26 AM  VAD Team --- VAD ISSUES ONLY--- Pager [phone] (7am - 7am) Advanced Heart Failure Team  Pager [phone] (M-F; 7a - 4p)  Please contact [facility] for night-coverage after hours (4p -7a ) and weekends on [email or web address]

## 2017-06-12 NOTE — Progress Notes (Signed)
Chaplain stopped in to provide support for patient and family.  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  Chaplain notified family that she is off the next couple days and will return Sunday morning.  On call Chaplains will be available however, I can be reached at [phone] by pager.   Chaplain so appreciative of the continual care patient is being given.  Thank you!    06/12/17 1649  Clinical Encounter Type  Visited With Patient;Family  Visit Type Follow-up;Psychological support;Spiritual support;Social support

## 2017-06-12 NOTE — Evaluation (Signed)
Clinical/Bedside Swallow Evaluation Patient Details  Name: James [name] MRN: [id number] Date of Birth: [birth year]  Today's Date: 06/12/2017 Time: SLP Start Time (ACUTE ONLY): 1405 SLP Stop Time (ACUTE ONLY): 1440 SLP Time Calculation (min) (ACUTE ONLY): 35 min  Past Medical History:  Past Medical History:  Diagnosis Date  . AICD (automatic cardioverter/defibrillator) present 09/2015   [facility] [place] VR model [id number] (serial Number [id number]) ICD   . CHF (congestive heart failure) (HCC)   . Chronic edema   . CMI (chronic mesenteric ischemia) (HCC)   . Coronary artery disease   . Dyspnea   . Gout   . Gout   . Hypertension   . Ischemic cardiomyopathy    EF 25%  . Myocardial infarction [facility])    "they saw I'd had one; not sure when but it was before 2012"  . Obesity   . Renal insufficiency   . Type II diabetes mellitus (HCC)    Past Surgical History:  Past Surgical History:  Procedure Laterality Date  . CARDIAC CATHETERIZATION  11/2010  . CARDIAC CATHETERIZATION N/A 07/15/2016   Procedure: Right Heart Cath;  Surgeon: Laurey Morale, MD;  Location: [facility] INVASIVE CV LAB;  Service: Cardiovascular;  Laterality: N/A;  . CORONARY ARTERY BYPASS GRAFT  11/2010   Sequential left internal mammary graft to the mid and distal LAD, sequential sapenous vein graft to the first, second, and third obutuse marginal branches  of the  left circumflex  coronary artery.  Saphenous vein graft to the distal right coronary artery.  . ENTEROSCOPY N/A 05/18/2017   Procedure: ENTEROSCOPY;  Surgeon: Ruffin Frederick, MD;  Location: [facility] ENDOSCOPY;  Service: Gastroenterology;  Laterality: N/A;  . EP IMPLANTABLE DEVICE N/A 10/06/2015   Procedure: ICD Implant;  Surgeon: Will Jorja Loa, MD; [facility] [place] VR model [id number] (serial Number [id number]) ICD implanted L chest   . HOT HEMOSTASIS N/A 04/24/2017   Procedure: HOT HEMOSTASIS (ARGON PLASMA COAGULATION/BICAP);  Surgeon: Ruffin Frederick, MD;  Location: [facility] ENDOSCOPY;  Service: Gastroenterology;  Laterality: N/A;  . INSERTION OF IMPLANTABLE LEFT VENTRICULAR ASSIST DEVICE N/A 03/29/2017   Procedure: INSERTION OF IMPLANTABLE LEFT VENTRICULAR ASSIST DEVICE;  Surgeon: Alleen Borne, MD;  Location: [facility] OR;  Service: Open Heart Surgery;  Laterality: N/A;  CIRC ARREST  NITRIC OXIDE  . RIGHT HEART CATH N/A 11/27/2016   Procedure: Right Heart Cath;  Surgeon: Laurey Morale, MD;  Location: [facility] INVASIVE CV LAB;  Service: Cardiovascular;  Laterality: N/A;  . RIGHT HEART CATH N/A 03/31/2017   Procedure: Right Heart Cath;  Surgeon: Laurey Morale, MD;  Location: [facility] INVASIVE CV LAB;  Service: Cardiovascular;  Laterality: N/A;  . RIGHT HEART CATH N/A 03/22/2017   Procedure: Right Heart Cath;  Surgeon: Laurey Morale, MD;  Location: [facility] INVASIVE CV LAB;  Service: Cardiovascular;  Laterality: N/A;  . SHOULDER OPEN ROTATOR CUFF REPAIR Right ~ 2000  . TEE WITHOUT CARDIOVERSION N/A 04/03/2017   Procedure: TRANSESOPHAGEAL ECHOCARDIOGRAM (TEE);  Surgeon: Alleen Borne, MD;  Location: [facility] OR;  Service: Open Heart Surgery;  Laterality: N/A;   HPI:  56 year old male admitted 04/01/2017 for LVAD placement. PMH significant for gout, DM2, HTN, CKD, CAD s/p CABGx6 (2012), systemic heart failure, [employer] ICD. Pt has had 4 intubations this admission.   Assessment / Plan / Recommendation Clinical Impression  Oral care completed with suction. Pt exhibits significantly weak voice and volitional cough. Cough is congested and nonproductive. Pt appeared  to tolerate ice chips without overt s/s aspiration or change in respiratory status, however, pt exhibited silent aspiration on prior objective study (FEES 05/08/17), and has since been reintubated for 2 weeks. FEES results suspected primary esophageal dysphagia, so MBS would be the preferred study, however, RN indicates pt is unable to leave the unit to go to radiology for the test.   Recommend repeating FEES  prior to liberating po diet, given silent aspiration on last study, and 2-week intubation in the interim.  MD has OK'd ice chips - recommend thorough oral care before giving ice chips, and position pt as upright as he can tolerate, remaining upright 15+ minutes after ice chips to allow for esophageal clearing.    SLP Visit Diagnosis: Dysphagia, pharyngoesophageal phase (R13.14)    Aspiration Risk  Moderate aspiration risk    Diet Recommendation Ice chips PRN after oral care   Liquid Administration via: Spoon Medication Administration: Via alternative means Supervision: Full supervision/cueing for compensatory strategies Compensations: Slow rate;Small sips/bites Postural Changes: Seated upright at 90 degrees    Other  Recommendations Oral Care Recommendations: Oral care prior to ice chip/H20 Other Recommendations: Have oral suction available   Follow up Recommendations   pending FEES results     Frequency and Duration  pending FEES results          Prognosis Barriers to Reach Goals: Severity of deficits;Time post onset      Swallow Study   General Date of Onset: 04/10/2017 HPI: 56 year old male admitted 03/24/2017 for LVAD placement. PMH significant for gout, DM2, HTN, CKD, CAD s/p CABGx6 (2012), systemic heart failure, [employer] Jude ICD. Pt has had 4 intubations this admission. Type of Study: Bedside Swallow Evaluation Previous Swallow Assessment: BSE 7/18/1/, FEES 05/08/17 - silent aspiration, suspect primary esophageal dysphagia Diet Prior to this Study: NPO;NG Tube Temperature Spikes Noted: No Respiratory Status: Nasal cannula History of Recent Intubation: Yes Length of Intubations (days): 18 days (4 intubations,3 @ 1-day each, most recent for 2 weeks) Date extubated: 06/11/17 Behavior/Cognition: Alert;Cooperative;Pleasant mood Oral Cavity Assessment: Within Functional Limits Oral Care Completed by SLP: Yes Oral Cavity - Dentition: Adequate natural dentition Vision: Functional  for self-feeding Self-Feeding Abilities: Total assist Patient Positioning: Upright in bed Baseline Vocal Quality: Low vocal intensity;Hoarse Volitional Cough: Weak;Congested Volitional Swallow: Able to elicit    Oral/Motor/Sensory Function Overall Oral Motor/Sensory Function: Within functional limits   Ice Chips Ice chips: Within functional limits Presentation: Spoon   Thin Liquid Thin Liquid: Not tested    Nectar Thick Nectar Thick Liquid: Not tested   Honey Thick Honey Thick Liquid: Not tested   Puree Puree: Not tested   Solid   GO   Solid: Not tested       Celia B. Murvin [facility], CCC-SLP Speech Pathologist [phone]  Leigh Aurora 06/12/2017,3:02 PM

## 2017-06-12 NOTE — Progress Notes (Signed)
   06/12/17 0925  Clinical Encounter Type  Visited With Patient  Visit Type Follow-up;Psychological support;Spiritual support;Social support;Critical Care  Referral From Family;Chaplain;Nurse  Consult/Referral To Chaplain  Recommendations (Continual support)  Spiritual Encounters  Spiritual Needs Prayer  Chaplain stopped in to visit with patient who is doing okay this morning.  Chaplain thankful for the time he got to spend with his family on yesterday.  Chaplain grateful for the medical team and all staff who has cared for James [name] and his family during this lengthy hospital journey.  May God bless each one of you.  Chaplain prayed with James [name] this morning.  He was able to speak to me and I heard his voice.  Continual support and presence provided for James [name] and his family.

## 2017-06-12 NOTE — Progress Notes (Signed)
CSW met at bedside with patient, wife and son. Patient was extubated yesterday and resting in bed today with nasal cannula. Patient states he is doing ok and feeling a little improved. Patient was pleased that he was able to participate in therapy. Wife continues to stay 24/7 but hopeful to maybe go [facility] to [facility] tomorrow if patient doing well. CSW will continue to follow for support and other needs as they arise. Raquel Sarna, [place], [place]

## 2017-06-12 NOTE — Progress Notes (Signed)
PULMONARY / CRITICAL CARE MEDICINE   Name: James [name] MRN: [id number] DOB: May 20, 1961    ADMISSION DATE:  04/13/2017 CONSULTATION DATE:  04/23/2017  REFERRING MD:  Laneta Simmers - CVTS  CHIEF COMPLAINT:  Acute respiratory failure  BRIEF SUMMARY:   56 year old male with history of systolic congestive heart failure with EF 15%, coronary artery disease, chronic renal failure, diabetes, and essential hypertension   Admitted on 04/04/2017 for ventricular assist device placement. Patient previously turned down by [facility] for transplant due to limitations in his mobility and his underlying gout. Course complicated by acute on chronic renal failure. He has had recurrent episodes of supraventricular tachycardia and GI bleeding. Patient was reintubated on 8/8 with concern for aspiration secondary to increased work of breathing and displaced NG tube. PCCM was consulted for help.  SUBJECTIVE:  Extubated 8/22. No events overnight.    VITAL SIGNS: BP 101/82   Pulse 67   Temp 98.1 F (36.7 C) (Oral)   Resp (!) 25   Ht 6' (1.829 m)   Wt 106.2 kg (234 lb 2.1 oz)   SpO2 97%   BMI 31.75 kg/m   HEMODYNAMICS: CVP:  [5 mmHg-13 mmHg] 8 mmHg  VENTILATOR SETTINGS:    INTAKE / OUTPUT: I/O last 3 completed shifts: In: 3953.2 [I.V.:601.2; Blood:502; Other:120; NG/GT:1930; IV Piggyback:800] Out: 4070 [Urine:2470; Emesis/NG output:450; Stool:1150]  PHYSICAL EXAMINATION: General:  Adult male, no distress  Integument:  Warm, dry, intact  HEENT:  Normocephalic  Cardiovascular: RRR, Anasarca, LVAD sound noted. Pulmonary:  Diminished breath sounds, non-labored  Abdomen: soft, active bowel sounds, non-tender  Neurological: alert, oriented, follows commands   LABS:  BMET  Recent Labs Lab 06/10/17 0153 06/11/17 0224 06/12/17 0713  NA 153* 156* 151*  K 3.9 4.1 4.0  CL 122* 124* 122*  CO2 26 26 26   BUN 69* 63* 59*  CREATININE 1.51* 1.39* 1.48*  GLUCOSE 109* 116* 136*    Electrolytes  Recent Labs Lab 06/06/17 0900  06/09/17 0317 06/10/17 0153 06/11/17 0224 06/12/17 0713  CALCIUM  --   < > 8.1* 8.3* 8.5* 8.0*  MG 1.9  --  2.2  --   --   --   < > = values in this interval not displayed. CBC  Recent Labs Lab 06/10/17 0153 06/11/17 0224 06/12/17 0713  WBC 12.7* 13.5* 12.5*  HGB 8.3* 7.7* 8.9*  HCT 26.5* 25.0* 29.7*  PLT 295 289 275   Coag's  Recent Labs Lab 06/09/17 0317 06/10/17 0153 06/11/17 0224  INR 1.52 1.50 1.39   Sepsis Markers No results for input(s): LATICACIDVEN, PROCALCITON, O2SATVEN in the last 168 hours. ABG No results for input(s): PHART, PCO2ART, PO2ART in the last 168 hours. Liver Enzymes No results for input(s): AST, ALT, ALKPHOS, BILITOT, ALBUMIN in the last 168 hours.  Cardiac Enzymes No results for input(s): TROPONINI, PROBNP in the last 168 hours.  Glucose  Recent Labs Lab 06/11/17 0357 06/11/17 0826 06/11/17 1159 06/11/17 2021 06/12/17 0323 06/12/17 0800  GLUCAP 102* 136* 121* 150* 126* 131*    Imaging No results found.   IMAGING/STUDIES: CT HEAD W/O 8/10: No acute intracranial process.Stable examination including mild atrophy, moderate chronic small vessel ischemic disease and old cerebellar infarcts. CXR 8/11:  Previously reviewed by me. No focal opacity. Right upper extremity central venous catheter in place. Endotracheal tube in good positioning. Enteric feeding tube coursing below diaphragm. CXR 8/14:  Previously reviewed by me. LVAD in place. No new focal opacity. No pleural  effusion appreciated. Endotracheal tube and central venous catheter in good position. Enteric feeding tube coursing below diaphragm. Persistent silhouetting of left hemidiaphragm and lower lung opacity.  MICROBIOLOGY: MRSA PCR 6/22:  Positive Blood Cultures x2 7/5:  Negative  Tracheal Aspirate Culture 7/5:  Citrobacter koseri & Klebsiella pneumoniae  Tracheal Aspirate Culture 7/7:  Klebsiella pneumoniae MRSA PCR  7/26:  Negative  Urine Culture 8/7:  Multiple Species  Blood Cultures x3 8/7:  Negative  Tracheal Aspirate Culture 8/8:  Multiple Organisms Present Urine Culture 8/9:  Negative Blood Cultures x2 8/11:  Negative  Tracheal Aspirate Culture 8/11:  Negative  Blood Cultures x2 8/12:  Negative   ANTIBIOTICS: Zinacef 6/28 (periop) Zosyn 7/5 - 7/13 Vancomycin 7/5 - 7/7; 8/8 >> Merrem 8/8 >>  SIGNIFICANT EVENTS: 06/22 - Admit  06/28 - Implanting of LVAD 07/05 - Respiratory arrest post feculent vomiting episode 07/06 - Extubated 07/17 - Intubated during EGD 07/18 - Extubated 08/08 - Respiratory distress & intubated. Lasix x2 doses given. 08/13 - Worsened renal function w/ Lasix w/o significant diuresis. Weaned on PS some.  08/14 - Lasix given by Cardiology 08/15 - Weaned off Levophed 08/16 - Tolerated PS 8/5 for the majority of the day. Tube feedings stopped as possible contributor to hyperkalemia. 08/20 - Requiring day of recovery post PS weaning yesterda. Hypernatremia slightly worse today. Tolerated lengthy PS 8/5 run today. 8/23 - Extubated   LINES/TUBES: OETT 6/28 - 6/29 ; 7/5 - 7/6; 7/17-7/18; 8/8 >8/23 RUE TL PICC 7/2 >>> R NGT >>> FOLEY >>> RECTAL TUBE >>>  ASSESSMENT / PLAN:    PULMONARY A: Acute, Recurrent Hypoxic Respiratory Failure: Patient and family would not want tracheostomy. Aspiration Pneumonia:  Secondary to tube feedings. P:   IS  CARDIOVASCULAR A:  Acute on chronic systolic congestive heart failure: Turned down for heart transplant. Status post LVAD. Atrial fibrillation Recurrent ventricular tachycardia Mediastinal hematoma: Status post evacuation post-LVAD. H/O essential hypertension P:  Management per cardiology and thoracic surgery Vitals per unit protocol Continuous telemetry monitoring  RENAL A:   Acute on chronic renal failure stage III: Continually improving. Hyperkalemia: Resolved. Hypernatremia/Hyperchloremia:  Slightly worse  today. P:   Continuing free water 100 mL via tube every 6 hours Diuresis as per primary service Continuing Foley catheter to monitor urine output Continue to monitor electrolytes and renal function daily  GASTROINTESTINAL A:   Severe protein-calorie malnutrition Chronic mesenteric ischemia P:   NPO  HEMATOLOGIC A:   Anemia:  No active bleeding. Hgb largely stable. Multifactorial with contribution from mediastinal hematoma.  Leukocytosis: Stable. Likely secondary to demargination with steroids. Coagulopathy: Secondary to systemic and coagulation.  P:  Trending cell counts daily with CBC while on systemic anticoagulation  INFECTIOUS A:   Aspiration pneumonia FUO: Cultures repeatedly negative. P:   Continuing treatment with meropenem as above Plan to reculture for fever  ENDOCRINE A:   Diabetes Mellitus Type 2:  Glucose still too tightly controlled. Hypothyroidism  P:   Continuing Synthroid Holding Lantus in anticipation of holding tube feedings Continuing Accu-Cheks every 4 hours Continuing moderate dose sliding scale algorithm  NEUROLOGIC/MUSCULOSKELETAL A:   Sedation on ventilator Gout P:   AED:  Keppra via tube BID. Continuing Colchicine (started 8/13) Continuing Prednisone 20mg  daily  Fentanyl IV prn pain Continuing Oxycodone prn pain Plan to use Morphine to palliate if necessary post extubation  Discussion: Family updated at bedside.   CC: 42 minutes   Jovita Kussmaul, AGACNP-BC [facility]  Pgr: [phone]  PCCM Pgr: [phone]   STAFF NOTE: Cindi Carbon, MD FACP have personally reviewed patient's available data, including medical history, events of note, physical examination and test results as part of my evaluation. I have discussed with resident/NP and other care providers such as pharmacist, RN and RRT. In addition, I personally evaluated patient and elicited key findings of:  Awake, alert, follows commands, no  distress even getting a bath, coarse BS, abdo soft, legs wrapped no new pcxr, na is improving, maintain free water as written , if NA not resolve further would need d5w infusion, not required at this stage, mobilize as able, IS as able, upright, with free water may need assess pcxr for edema in future if declines, medical management, DNR noted, I updated pt family and pt in room, cal if needed, no mrsa, dc vanc, need to establish stop date for empiric meropenem   Mcarthur Rossetti. Tyson Alias, MD, FACP Pgr: [phone] [facility] 06/12/2017 8:35 AM

## 2017-06-12 NOTE — Progress Notes (Signed)
LVAD Inpatient Coordinator Rounding Note:  Admitted 03/21/2017 due to A/C Heart failure.   [employer] LVAD implanted on 03/30/2017 by Dr. Laneta Simmers as DT VAD.  Transferred to 4E 05/17/17.  Transferred back to ICU 2H09 on 05/28/17 with possible aspiration pneumonia requiring re-intubation.   Vital signs: Temp: 98.5 HR: 65 Doppler: 90 Auto BP: 114/78 (88) O2 Sat: 100 on 4L/[place] Wt in lbs: Marland Kitchen.. 230>229>226>224>235>221>217>223>224>224>234>240>245>235>234  LVAD interrogation reveals:  Speed: 2540 Flow: 3.8 Power: 3.4 w Alarms: suction alarm now OFF  Peak:  6.7 Trough: 1.8 HCT: 29.7 Low flow alarm setting: 2 High watt alarm setting: 5.5  Suction: OFF  Lavare cycle: on  Blood Products: 6/28> 5 PRBC's, 6 FFP 7/1> 1 PRBC 7/9> 1 PRBC 7/13>3 PRBC 7/14>3 PRBC 7/15> 1 PRBC 7/16>3PRBC 7/17> 3 PRBC 7/20> 1 PRBC 8/6> 1 PRBC 8/10 > 1 PRBC 8/22 > 1 PRBC  Gtts: Milrinone restarted 7/8, stopped 7/16 Levo stopped 04/26/17 restarted 13-May-2017 - stopped 05/07/17 Amiodarone stopped 04/29/17, restarted 7/17 - transitioned to PO 05/13/17; VT on 05/15/17 - IV amio re-started - 30 mg/hr stopped 05/16/17 Heparin 2200 u/hr Lidocaine 05/07/17 - stopped 7/19  TPN - started 04/24/17 for nutritional support, stopped 04/30/17  NG inserted with tube feeding started 05/27/17; placed on hold 05/28/17   Cortrak inserted 06/07/17 - started TF 05/1917   Neuro: 05/27/17- Neurology consult for lethargy, mouth tremors, not vocal, not following commands> Head CT and EEG 05/28/17 - Continual EEG monitoring  05/30/17 - started Depakote for seizure activity - stopped 06/03/17 for rash - will start Keppra   Arrhythmia: 04/24/17 - Afib with RVR - started amiodarone 05/02/17 - 2 sustained episodes of VT - cardioverted x 1; second broke with overdrive pacing 7/17- wide complex tachycardia- Amio bolus x3 and gtt at 60 mg/hr 05/07/17 - sustained VT - converted with overdrive pacing; Lidocaine started in addition to  amiodarone 05/08/17 - Lidocaine stopped due to confusion (lido level 12.4) 05/14/17 - sustained VT - converted 3 separate occassions with overdrive pacing 05/19/17 - sustained VT-converted with overdrive pacing  05/26/17- Afib rate in the 50's 05/27/17 - NSR   Respiratory: 04/23/17 - re-intubated due to respiratory failure secondary to suspected aspiration pneumonia 04/25/17-extubated 05-13-17- intubated for EGD 2017-05-13- extubated 05/28/17 - re-intubated for suspected aspiration pneumonia 06/11/17 - one way extubation  Drive Line: Daily Dressing Kits with Aquacell AG silver strips per protocol - will advance to every other day dressing changes.  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 He had epinephrine, APC,    and 2 clips.  - 7/19 - Lidocaine gtt stopped due to confusion (lido toxicity) -8/7- Neurology consult- lethargy, mouth tremors, not vocal, not following commands> Head CT and EEG, blood and urine    cultures sent, Ammonia-20 8/8 - re-intubated for possible aspiration pneumonia   Plan/Recommendations:  1. Currently every other day dressing changes. 2. We will continue to support pt and family emotionally during this time.  3. Please page VAD pager for any equipment or patient needs.   Carlton Adam RN, VAD Coordinator 24/7 pager [phone]

## 2017-06-13 LAB — BASIC METABOLIC PANEL
ANION GAP: 8 (ref 5–15)
BUN: 60 mg/dL — ABNORMAL HIGH (ref 6–20)
CALCIUM: 8.5 mg/dL — AB (ref 8.9–10.3)
CO2: 26 mmol/L (ref 22–32)
CREATININE: 1.41 mg/dL — AB (ref 0.61–1.24)
Chloride: 121 mmol/L — ABNORMAL HIGH (ref 101–111)
GFR calc Af Amer: >60 mL/min (ref >60)
GFR, EST NON AFRICAN AMERICAN: 55 mL/min — AB (ref >60)
GLUCOSE: 110 mg/dL — AB (ref 65–99)
Potassium: 4 mmol/L (ref 3.5–5.1)
Sodium: 155 mmol/L — ABNORMAL HIGH (ref 135–145)

## 2017-06-13 LAB — PROTIME-INR
INR: 1.37
PROTHROMBIN TIME: 17 s — AB (ref 11.4–15.2)

## 2017-06-13 LAB — CBC
HCT: 29.9 % — ABNORMAL LOW (ref 39.0–52.0)
Hemoglobin: 9.2 g/dL — ABNORMAL LOW (ref 13.0–17.0)
MCH: 28.2 pg (ref 26.0–34.0)
MCHC: 30.8 g/dL (ref 30.0–36.0)
MCV: 91.7 fL (ref 78.0–100.0)
PLATELETS: 279 10*3/uL (ref 150–400)
RBC: 3.26 MIL/uL — ABNORMAL LOW (ref 4.22–5.81)
RDW: 19.7 % — AB (ref 11.5–15.5)
WBC: 13.1 10*3/uL — ABNORMAL HIGH (ref 4.0–10.5)

## 2017-06-13 LAB — GLUCOSE, CAPILLARY
GLUCOSE-CAPILLARY: 131 mg/dL — AB (ref 65–99)
GLUCOSE-CAPILLARY: 185 mg/dL — AB (ref 65–99)
GLUCOSE-CAPILLARY: 189 mg/dL — AB (ref 65–99)
GLUCOSE-CAPILLARY: 189 mg/dL — AB (ref 65–99)
Glucose-Capillary: 125 mg/dL — ABNORMAL HIGH (ref 65–99)
Glucose-Capillary: 135 mg/dL — ABNORMAL HIGH (ref 65–99)

## 2017-06-13 LAB — COOXEMETRY PANEL
CARBOXYHEMOGLOBIN: 1.9 % — AB (ref 0.5–1.5)
METHEMOGLOBIN: 0.9 % (ref 0.0–1.5)
O2 Saturation: 81.9 %
TOTAL HEMOGLOBIN: 9.6 g/dL — AB (ref 12.0–16.0)

## 2017-06-13 LAB — HEPARIN LEVEL (UNFRACTIONATED): HEPARIN UNFRACTIONATED: 0.4 [IU]/mL (ref 0.30–0.70)

## 2017-06-13 LAB — LACTATE DEHYDROGENASE: LDH: 164 U/L (ref 98–192)

## 2017-06-13 MED ORDER — SODIUM CHLORIDE 0.9 % IV SOLN
INTRAVENOUS | Status: DC
  Administered 2017-06-13 – 2017-06-14 (×2): via INTRAVENOUS

## 2017-06-13 MED ORDER — HYDRALAZINE HCL 25 MG PO TABS
25.0000 mg | ORAL_TABLET | Freq: Once | ORAL | Status: AC
  Administered 2017-06-13: 25 mg via ORAL
  Filled 2017-06-13: qty 1

## 2017-06-13 MED ORDER — WHITE PETROLATUM GEL
Status: AC
  Administered 2017-06-13: 08:00:00
  Filled 2017-06-13: qty 1

## 2017-06-13 MED ORDER — HYDRALAZINE HCL 50 MG PO TABS
75.0000 mg | ORAL_TABLET | Freq: Three times a day (TID) | ORAL | Status: DC
  Administered 2017-06-13 – 2017-06-14 (×3): 75 mg via ORAL
  Filled 2017-06-13 (×3): qty 1

## 2017-06-13 MED ORDER — FREE WATER
300.0000 mL | Freq: Four times a day (QID) | Status: DC
  Administered 2017-06-13 – 2017-06-16 (×12): 300 mL

## 2017-06-13 MED ORDER — WARFARIN SODIUM 2 MG PO TABS
2.0000 mg | ORAL_TABLET | Freq: Once | ORAL | Status: AC
  Administered 2017-06-13: 2 mg via ORAL
  Filled 2017-06-13: qty 1

## 2017-06-13 NOTE — Progress Notes (Signed)
Physical Therapy Wound Treatment Patient Details  Name: UCHECHUKWU DHAWAN MRN: [id number] Date of Birth: [birth year]  Today's Date: 06/13/2017 Time: 1205-1307 Time Calculation (min): 62 min  Subjective  Subjective: I'm good Patient and Family Stated Goals: healed up so I can do well with therapy  Pain Score: Pain Score: 0-No pain  Wound Assessment  Pressure Injury 05/14/17 Unstageable - Full thickness tissue loss in which the base of the ulcer is covered by slough (yellow, tan, gray, green or brown) and/or eschar (tan, brown or black) in the wound bed. WOC updated, wound evolution to unstageable  (Active)  Dressing Type Gauze (Comment);Moist to dry;Foam;Silicone dressing;Other (Comment) 06/13/2017  1:00 PM  Dressing Clean;Dry;Intact;New drainage 06/13/2017  1:00 PM  Dressing Change Frequency Daily 06/13/2017  1:00 PM  State of Healing Eschar 06/13/2017  1:00 PM  Site / Wound Assessment Brown;Yellow 06/13/2017  1:00 PM  % Wound base Red or Granulating 0% 06/13/2017  1:00 PM  % Wound base Yellow/Fibrinous Exudate 0% 06/13/2017  1:00 PM  % Wound base Black/Eschar 100% 06/13/2017  1:00 PM  % Wound base Other/Granulation Tissue (Comment) 0% 06/13/2017  1:00 PM  Peri-wound Assessment Intact;Erythema (blanchable) 06/13/2017  1:00 PM  Wound Length (cm) 6 cm 06/13/2017  1:00 PM  Wound Width (cm) 7 cm 06/13/2017  1:00 PM  Wound Depth (cm) 3 cm 06/13/2017  1:00 PM  Tunneling (cm) 0 05/14/2017  9:00 PM  Undermining (cm) 2 cm at 11*, 1 cm at 6 * 06/13/2017  1:00 PM  Margins Unattached edges (unapproximated) 06/13/2017  1:00 PM  Drainage Amount Copious 06/13/2017  1:00 PM  Drainage Description Purulent 06/13/2017  1:00 PM  Treatment Cleansed;Debridement (Selective);Hydrotherapy (Pulse lavage);Packing (Saline gauze) 06/13/2017  1:00 PM      Hydrotherapy Pulsed lavage therapy - wound location: sacral Pulsed Lavage with Suction (psi): 8 psi Pulsed Lavage with Suction - Normal Saline Used: 1000 mL Pulsed Lavage Tip: Tip  with splash shield Selective Debridement Selective Debridement - Location: sacral Selective Debridement - Tools Used: Forceps;Scalpel;Scissors Selective Debridement - Tissue Removed: thick emulsified slough   Wound Assessment and Plan  Wound Therapy - Assess/Plan/Recommendations Wound Therapy - Clinical Statement: pt will benefit from hydro for the pulsed lavage that will help cleansed wound, decrease bacterial load and otherwise prep wound for selective debridement. Wound Therapy - Functional Problem List: Relative immobility, decreased strength, compromised nutrition and skin integrity.  See PT notes Factors Delaying/Impairing Wound Healing: Incontinence;Infection - systemic/local;Immobility;Multiple medical problems Hydrotherapy Plan: Debridement;Dressing change;Patient/family education;Pulsatile lavage with suction Wound Therapy - Frequency: 6X / week Wound Therapy - Current Recommendations: PT;Case manager/social work;WOC nurse Wound Therapy - Follow Up Recommendations: Home health RN;Skilled nursing facility Wound Plan: see above  Wound Therapy Goals- Improve the function of patient's integumentary system by progressing the wound(s) through the phases of wound healing (inflammation - proliferation - remodeling) by: Decrease Necrotic Tissue to: 25 % Decrease Necrotic Tissue - Progress: Progressing toward goal Increase Granulation Tissue to: 75% including any viable tissues Increase Granulation Tissue - Progress: Progressing toward goal Improve Drainage Characteristics: Min;Serous Improve Drainage Characteristics - Progress: Goal set today Goals/treatment plan/discharge plan were made with and agreed upon by patient/family: Yes Time For Goal Achievement: 7 days Wound Therapy - Potential for Goals: Good  Goals will be updated until maximal potential achieved or discharge criteria met.  Discharge criteria: when goals achieved, discharge from hospital, MD decision/surgical intervention,  no progress towards goals, refusal/missing three consecutive treatments without notification or medical reason.  GP  Tessie Fass Sharlize Hoar 06/13/2017, 1:32 PM 06/13/2017  Donnella Sham, [place] [phone]  (pager)

## 2017-06-13 NOTE — Progress Notes (Signed)
Occupational Therapy Re- Evaluation Patient Details Name: James [name] MRN: [id number] DOB: [birth year] Today's Date: 06/13/2017    History of Present Illness Pt adm with acute on chronic heart failure and underwent Heartware HVAD implant on 6/28. Returned to OR later that day for evacuation of mediastinal hematoma. On 7/4 developed ileus with likely aspiration and intubated 7/4-7/6.   Pt developed Heme + stools with progressive anemia as well as  [name] 05/02/17.  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 He was intubated for procedure and extubated 05/08/17.   He developed confusion 8/7 - head CT showed small bil. subdural hygromas or hematomas, not significantly changed since 05/08/17; and Rt superior cerebellar lacunar infarct which were new since 7/19, but have a chronic appearance. He developed worsened respirtory status 8/8 requiring intubation; developed gout flare up; and was extubated (one way as pt does not desire trach) on 8/23.   PMH - gout, DMII, HTN, CKD, CAD S/P CABGx6 2012, chronic systolic heart failure and [facility]e ICD.   Clinical Impression   Pt seen today for re-evaluation.  He is significantly weaker than he was previously, but continues to be very motivated.  He requires total A for ADLs and max A +2 for functional mobility.  He was able to sit unsupported in chair position in the bed x 30 mins while performing UB and LB exercises as well as working on trunk control.  Goals updated.  Will continue to recommend CIR if he progresses adequately.  Will follow.     Follow Up Recommendations  CIR;Supervision/Assistance - 24 hour (eventually )    Equipment Recommendations  3 in 1 bedside commode    Recommendations for Other Services Rehab consult     Precautions / Restrictions Precautions Precautions: Fall Precaution Comments: Heartware HVAD Restrictions Weight Bearing Restrictions: No      Mobility Bed Mobility Overal bed mobility: Needs Assistance   Rolling: Max  assist;+2 for physical assistance         General bed mobility comments: Pt rolled Lt and Rt for peri care   Transfers                 General transfer comment: Unable to attempt     Balance Overall balance assessment: Needs assistance Sitting-balance support: Feet supported Sitting balance-Leahy Scale: Poor Sitting balance - Comments: Pt moved into chair position while in the bed and was able to sit unupported x ~30 mins with UE support and occasional min A due to mild Lt lean.  Worked on postural control in sitting as well as UE and LB exercise  Postural control: Left lateral lean                                 ADL either performed or assessed with clinical judgement   ADL Overall ADL's : Needs assistance/impaired Eating/Feeding: NPO;Sitting   Grooming: Wash/dry hands;Wash/dry face;Oral care;Brushing hair;With caregiver independent assisting;Sitting   Upper Body Bathing: Total assistance;Sitting   Lower Body Bathing: Total assistance;Bed level   Upper Body Dressing : Total assistance;Sitting   Lower Body Dressing: Total assistance;Bed level   Toilet Transfer: Total assistance Toilet Transfer Details (indicate cue type and reason): unable to attempt  Toileting- Clothing Manipulation and Hygiene: Total assistance;Bed level Toileting - Clothing Manipulation Details (indicate cue type and reason): Pt with leakage of stool around flexi seal.  Assisted pt with clean up in supine  Functional mobility during ADLs: Total assistance       Vision         Perception     Praxis      Pertinent Vitals/Pain Pain Assessment: Faces Faces Pain Scale: Hurts little more Pain Location: Lt UE and Lt LE  Pain Descriptors / Indicators: Sore;Grimacing;Guarding Pain Intervention(s): Limited activity within patient's tolerance;Monitored during session     Hand Dominance Right   Extremity/Trunk Assessment Upper Extremity Assessment Upper Extremity  Assessment: Generalized weakness (grossly 2/5)   Lower Extremity Assessment Lower Extremity Assessment: Defer to PT evaluation   Cervical / Trunk Assessment Cervical / Trunk Assessment: Other exceptions Cervical / Trunk Exceptions: trunk weakness   Communication Communication Communication: No difficulties   Cognition Arousal/Alertness: Awake/alert Behavior During Therapy: Flat affect Overall Cognitive Status: Impaired/Different from baseline Area of Impairment: Attention;Memory;Following commands;Problem solving                   Current Attention Level: Sustained Memory: Decreased recall of precautions;Decreased short-term memory Following Commands: Follows one step commands consistently;Follows one step commands inconsistently     Problem Solving: Slow processing;Decreased initiation;Difficulty sequencing;Requires verbal cues;Requires tactile cues General Comments: Pt slow to respond.  He requires mod cues to recall swallowing precautions    General Comments  Wife present.  VSS.  Pt very motivated     Exercises General Exercises - Upper Extremity Shoulder Flexion: AAROM;Right;Left;Seated;20 reps General Exercises - Lower Extremity Long Arc Quad: AAROM;Right;Left;20 reps;Seated   Shoulder Instructions      Home Living Family/patient expects to be discharged to:: Private residence Living Arrangements: Spouse/significant other Available Help at Discharge: Family;Available PRN/intermittently Type of Home: House Home Access: Level entry     Home Layout: Multi-level Alternate Level Stairs-Number of Steps: 4-6 Alternate Level Stairs-Rails: Right;Left Bathroom Shower/Tub: Producer, television/film/video: Handicapped height     Home Equipment: Bedside commode;Crutches   Additional Comments: pt reports deficits with peri care due to hand strength at times      Prior Functioning/Environment Level of Independence: Independent        Comments: was  independent prior to admission, walking laps and going to the grocery store        OT Problem List: Decreased strength;Decreased range of motion;Decreased activity tolerance;Impaired balance (sitting and/or standing);Decreased cognition;Decreased safety awareness;Cardiopulmonary status limiting activity;Impaired UE functional use;Pain;Decreased knowledge of precautions;Decreased knowledge of use of DME or AE;Decreased coordination;Increased edema      OT Treatment/Interventions: Self-care/ADL training;Neuromuscular education;DME and/or AE instruction;Therapeutic activities;Patient/family education;Balance training    OT Goals(Current goals can be found in the care plan section) Acute Rehab OT Goals Patient Stated Goal: to go home OT Goal Formulation: With patient/family Time For Goal Achievement: 06/27/17 Potential to Achieve Goals: Good ADL Goals Pt Will Perform Grooming: with mod assist;sitting Pt Will Perform Upper Body Bathing: with mod assist;sitting Pt Will Perform Lower Body Bathing: with max assist;sit to/from stand Pt Will Transfer to Toilet: with mod assist;with +2 assist;stand pivot transfer;bedside commode Pt Will Perform Toileting - Clothing Manipulation and hygiene: with max assist;sit to/from stand Additional ADL Goal #1: Pt will require min A for UE HEP   OT Frequency: Min 3X/week   Barriers to D/C:            Co-evaluation PT/OT/SLP Co-Evaluation/Treatment: Yes Reason for Co-Treatment: Complexity of the patient's impairments (multi-system involvement)   OT goals addressed during session: ADL's and self-care      AM-PAC PT "6 Clicks" Daily Activity  Outcome Measure Help from another person eating meals?: Total Help from another person taking care of personal grooming?: Total Help from another person toileting, which includes using toliet, bedpan, or urinal?: Total Help from another person bathing (including washing, rinsing, drying)?: Total Help from  another person to put on and taking off regular upper body clothing?: Total Help from another person to put on and taking off regular lower body clothing?: Total 6 Click Score: 6   End of Session Equipment Utilized During Treatment: Oxygen Nurse Communication: Mobility status;Other (comment) (request nsg place pt in chair position over weekend )  Activity Tolerance: Patient tolerated treatment well Patient left: in bed;with call bell/phone within reach;with family/visitor present;with nursing/sitter in room  OT Visit Diagnosis: Muscle weakness (generalized) (M62.81)                Time: 1610-9604 OT Time Calculation (min): 51 min Charges:  OT General Charges $OT Visit: 1 Procedure OT Evaluation $OT Re-eval: 1 Procedure OT Treatments $Therapeutic Activity: 8-22 mins G-Codes:     [employer], OTR/L [phone]   Jeani Hawking M 06/13/2017, 12:43 PM

## 2017-06-13 NOTE — Progress Notes (Signed)
ANTICOAGULATION CONSULT NOTE - Follow Up Consult  Pharmacy Consult for Heparin  + Coumadin Indication: HVAD  No Known Allergies  Patient Measurements: Height: 6' (182.9 cm) Weight: 236 lb (107 kg) IBW/kg (Calculated) : 77.6  Vital Signs: Temp: 97.8 F (36.6 C) (08/24 0800) Temp Source: Oral (08/24 0800) BP: 115/80 (08/24 0800) Pulse Rate: 70 (08/24 0800)  Labs:  Recent Labs  06/11/17 0224 06/12/17 0713 06/13/17 0248  HGB 7.7* 8.9* 9.2*  HCT 25.0* 29.7* 29.9*  PLT 289 275 279  LABPROT 17.2* 17.6* 17.0*  INR 1.39 1.43 1.37  HEPARINUNFRC 0.37 0.30 0.40  CREATININE 1.39* 1.48* 1.41*    Estimated Creatinine Clearance: 75.7 mL/min (A) (by C-G formula based on SCr of 1.41 mg/dL (H)).  Assessment: 56 y/o M continues on heparin s/p HVAD on 03/26/2017.  No overt bleeding noted, CBC stable   Heparin level 0.4 this am within goal range on heparin drip rate 1600 uts/hr.  Pharmacy asked to resume Coumadin 8/22.  Has been very sensitive to Coumadin earlier this admission.  Goal of Therapy:  Heparin level 0.3-0.5 units/mL INR 2-2.5 Monitor platelets by anticoagulation protocol: Yes   Plan:  Continue heparin drip 1600 uts/hr Daily heparin level and CBC. Coumadin 2 mg x 1 tonight. Daily INR.  Tad Moore, BCPS  Clinical Pharmacist Pager [phone]  06/13/2017 8:46 AM

## 2017-06-13 NOTE — Progress Notes (Addendum)
Patient ID: James [name], male   DOB: January 20, 1961, 56 y.o.   MRN: [id number]   Advanced Heart Failure VAD Team Note  Subjective:    Events: --HVAD placed 6/28.  Returned to the OR that evening with high chest tube output, evacuation of mediastinal hematoma.  --Extubated 6/29. Milrinone stopped on 7/4. --7/4, patient developed ileus with respiratory compromise. NGT placed with 3 L suctioned out.  CXR with suspicion for aspiration PNA.  Patient had to be intubated.  He went into atrial fibrillation with RVR.  He became hypotensive and was started on norepinephrine and phenylephrine.  Amiodarone gtt begun.    --Extubated again on 7/6.  -- 7/13 Had 2 sustained episodes of VT. Had to be cardioverted 1. Second episode broke with overdrive pacing with Dr. Caryl Comes. VAD speed turned down to 2700.  --Melena due to acuteGI bleed --> 2 units PRBCs 7/14, 2 units 7/15. 3 U PRBCs 7/16,  04/27/2017 3 UPRBCs.  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  He had epinephrine, APC, and 2 clips. Intubated prior to procedure and placed on norepinephrine. Speed dropped to 2660.  --7/18 Back in VT with overdrive pacing. Lidocaine was started in addition to amiodarone. Extubated. --7/19 lidocaine stopped with confusion. Neuro consulted.  EEG normal.  CT of head no acute findings. 7/20 confusion had resolved.  --1 unit PRBCs 7/20.  --7/22, developed sustained VT with rate around 130 shortly after getting IV Lasix.  We were able to pace him out and back to NSR.  He was put back on amiodarone gtt.  --More VT on 7/25, paced out.  Back on IV amiodarone gtt.  NSR.  -- 7/31 Ramp ECHO decrease speed 2600 rpm.  -- 8/1 VT- paced out -- 8/2 VT - started on sotalol -- 8/6 Hgb 7.5, 1 unit PRBCs -- 8/7 Hgb 9.5, confused.  Head CT with right superior cerebellar artery lacunar infarcts, not acute. -- 8/8 Possible aspiration. Reintubated. -- 8/10 possible seizure => Depakote.  CT head no  change.  1 unit PRBCs.  -- Multiple suction alarms on 8/17 night which persisted so suction alarm deactivated.  -- Extubated 8/23  Denies SOB. Says gout flare has resolved. CVP 3. Off diuretics.   Remains on heparin gtt + warfarin.    HVAD INTERROGATION:  HVAD:  Flow 4.1 liters/min, speed 2540,  power 3 W,  Peak 7 Trough 2. Suction alarms off. Lavare On.     Objective:    Vital Signs:   Temp:  [97.9 F (36.6 C)-98.8 F (37.1 C)] 98.1 F (36.7 C) (08/24 0300) Pulse Rate:  [60-69] 63 (08/24 0700) Resp:  [20-29] 26 (08/24 0700) BP: (97-126)/(76-90) 110/88 (08/24 0700) SpO2:  [94 %-100 %] 100 % (08/24 0700) Weight:  [236 lb (107 kg)] 236 lb (107 kg) (08/24 0425) Last BM Date: 06/12/17 Mean arterial Pressure 90s Intake/Output:   Intake/Output Summary (Last 24 hours) at 06/13/17 0738 Last data filed at 06/13/17 0700  Gross per 24 hour  Intake             2044 ml  Output             2610 ml  Net             -566 ml     Physical Exam    Physical Exam: CVP 3 GENERAL: Appears fatigued.  HEENT: normal  NECK: Supple, JVP flat  .  2+ bilaterally, no bruits.  No lymphadenopathy or  thyromegaly appreciated.   CARDIAC:  Mechanical heart sounds with LVAD hum present.  LUNGS:  Rhonchi throughout. On 4 liters oxygen   ABDOMEN:  Soft, round, nontender, positive bowel sounds x4.    LVAD exit site:  Dressing dry and intact.  No erythema or drainage.  Stabilization device present and accurately applied.  Driveline dressing is being changed daily per sterile technique. EXTREMITIES:  Warm and dry, no cyanosis, clubbing, rash or edema. RUE PICC  NEUROLOGIC:  Alert and oriented x 4.  Gait steady.  No aphasia.  No dysarthria.  Affect pleasant.  Skin: Sacral dressing.      Telemetry   Personally reviewed Vpaced 60s   Labs   Basic Metabolic Panel:  Recent Labs Lab 06/06/17 0900  06/09/17 0317 06/10/17 0153 06/11/17 0224 06/12/17 0713 06/13/17 0248  NA  --   < > 153* 153* 156*  151* 155*  K  --   < > 4.1 3.9 4.1 4.0 4.0  CL  --   < > 123* 122* 124* 122* 121*  CO2  --   < > 27 26 26 26 26   GLUCOSE  --   < > 111* 109* 116* 136* 110*  BUN  --   < > 71* 69* 63* 59* 60*  CREATININE  --   < > 1.51* 1.51* 1.39* 1.48* 1.41*  CALCIUM  --   < > 8.1* 8.3* 8.5* 8.0* 8.5*  MG 1.9  --  2.2  --   --   --   --   < > = values in this interval not displayed.  Liver Function Tests: No results for input(s): AST, ALT, ALKPHOS, BILITOT, PROT, ALBUMIN in the last 168 hours. No results for input(s): LIPASE, AMYLASE in the last 168 hours. No results for input(s): AMMONIA in the last 168 hours.  CBC:  Recent Labs Lab 06/09/17 0317 06/10/17 0153 06/11/17 0224 06/12/17 0713 06/13/17 0248  WBC 12.1* 12.7* 13.5* 12.5* 13.1*  HGB 8.4* 8.3* 7.7* 8.9* 9.2*  HCT 26.7* 26.5* 25.0* 29.7* 29.9*  MCV 89.0 91.1 91.2 91.1 91.7  PLT 307 295 289 275 279    INR:  Recent Labs Lab 06/09/17 0317 06/10/17 0153 06/11/17 0224 06/12/17 0713 06/13/17 0248  INR 1.52 1.50 1.39 1.43 1.37    Other results:     Imaging   Dg Chest Port 1 View  Result Date: 06/12/2017 CLINICAL DATA:  Shortness of breath. EXAM: PORTABLE CHEST 1 VIEW COMPARISON:  06/08/2017. FINDINGS: Interim extubation. Feeding tube noted with its tip over the stomach. Prior CABG. Left ventricular assist device noted in stable position . Cardiomegaly with pulmonary venous congestion. Basilar atelectasis. No pleural effusion or pneumothorax . IMPRESSION: 1. Interim extubation. Feeding tube noted with its tip over the stomach. 2. Prior CABG. Left ventricular assist device in stable position. Cardiomegaly with pulmonary venous congestion. 3. Basilar atelectasis. Small left pleural effusion . Chest is stable from prior exam. Electronically Signed   By: Marcello Moores  Register   On: 06/12/2017 09:08     Medications:     Scheduled Medications: . amiodarone  200 mg Per Tube BID  . chlorhexidine gluconate (MEDLINE KIT)  15 mL Mouth  Rinse BID  . Chlorhexidine Gluconate Cloth  6 each Topical q morning - 10a  . colchicine  0.6 mg Oral Daily  . collagenase   Topical Daily  . febuxostat  120 mg Oral Daily  . feeding supplement (NEPRO CARB STEADY)  1,000 mL Per Tube Q24H  . feeding  supplement (PRO-STAT SUGAR FREE 64)  60 mL Per Tube BID  . free water  200 mL Per Tube Q6H  . hydrALAZINE  50 mg Oral Q8H  . insulin aspart  0-15 Units Subcutaneous Q4H  . levETIRAcetam  500 mg Per Tube BID  . levothyroxine  50 mcg Per NG tube QAC breakfast  . magnesium oxide  400 mg Per Tube Daily  . mouth rinse  15 mL Mouth Rinse Q4H  . mexiletine  300 mg Per Tube Q12H  . pantoprazole sodium  40 mg Per Tube BID  . predniSONE  20 mg Per Tube Q breakfast  . sodium chloride flush  10-40 mL Intracatheter Q12H  . sotalol  80 mg Per Tube Daily  . Warfarin - Pharmacist Dosing Inpatient   Does not apply q1800    Infusions: . sodium chloride 10 mL/hr at 06/13/17 0700  . heparin 1,600 Units/hr (06/13/17 0700)  . meropenem (MERREM) IV Stopped (06/13/17 0555)  . morphine    . norepinephrine (LEVOPHED) Adult infusion Stopped (06/04/17 2250)    PRN Medications: acetaminophen (TYLENOL) oral liquid 160 mg/5 mL, albuterol, docusate, fentaNYL (SUBLIMAZE) injection, Gerhardt's butt cream, hydrALAZINE, midazolam, morphine injection, morphine, neomycin-bacitracin-polymyxin, ondansetron (ZOFRAN) IV, oxyCODONE, sodium chloride flush   Patient Profile   56 yo with CAD s/p CABG, ischemic cardiomyopathy/chronic systolic CHF, tophaceous gout, and CKD stage 3 was admitted for diuresis and consideration for LVAD placement. S/p HVAD on 6/28  Assessment/Plan:    1. Acute/chronic systolic CHF s/p HVAD: Ischemic cardiomyopathy.  [employer] ICD.  Echo (6/18) with EF 15%, mildly dilated RV with moderately decreased systolic function.  s/p HVAD placement 6/28 and had to return to OR to evacuate mediastinal hematoma.  He had been weaned off pressors/milrinone, but  developed ileus w/ likely aspiration event 7/4, re-intubated, developed afib/RVR requiring norepinephrine. Extubated 7/6.  VAD speed turned down to 2540 with VT. VAD parameters currently look good.  Milrinone stopped 7/17 with recurrent VT.  He was started back on norepinephrine 8/9 due to soft BP after intubation and suspected septic shock, now off norepinephrine.  MAP 80s-90, getting hydralazine per tube. Volume status fine centrally with CVP 7. Main issue is massive 3rd spacing but low intravascular volume.  Legs now wrapped and weight stable. CVP low. No diuretics.  - Maps high. Increase hydralazine to 75 mg three times a day. Consider amlodipine tomorrow if Maps  continues to run high.  - Off ASA with GI Bleed  - Goal INR 2-2.5. INR 1.37. Discussed with Pharm D. Continue heparin drip + coumadin. Stop heparin drip once INR 2.   2. AKI on CKD stage 3: Creatinine has been improved, pending today.  3. Symptomatic anemia due to acute UGI bleeding:  Received 3 units PRBCs 7/16 and 3 units PRBCs on 05/12/2017. 1 unit PRBCs 7/20. 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  Requiring 2 clips + epi + APC. 1 unit PRBCs 8/6.  He had 1 unit PRBCs 8/10 to provide volume.  1 unit PRBCs on 8/22.    - Got Feraheme 7/28.  - Continue Protonix 40 po bid. - Hgb stable today.   4. Ventricular tachycardia: Status post ventricular tachycardia 2 on 7/13. Possible suction event. VAD speed turned down to 2700.  Recurrent VT 7/17. EP called to bedside. Overdrive pacing successful for about 10 minutes but VT recurred.  Milrinone turned off, remained in VT overnight but hemodynamically stable.  Speed decreased to 2600.  On  7/18, we were able to pace him out of VT.  Lidocaine added then stopped due to confusion/mental status changes.  He had VT 7/22 about 30 minutes after getting IV Lasix, had to be paced out again => may have been due to rapid fluid shift.  Pt paced out of VT 3 separate occasions  7/25. Got amio bolus x 2 and started back on IV infusion.  Transitioned to amiodarone 400 mg tid.  On 8/1 had recurrent VT and paced out again.  Recurrent VT 8/2 per EP sotalol started along with amiodarone, sotalol decreased to once daily with bradycardia.  - Continue mexiletine to 300 mg BID, sotalol 80 mg daily, amio 200 mg BID. - Remains quiescent. Continue to follow closely.  - Keep K > 4.0 Mg > 2.0 5. Paroxysmal Atrial fibrillation: Developed atrial fibrillation with RVR in setting of aspiration PNA on 7/4.  In NSR currently.  6. Malnutrition:  Continue tube feeds via Cor-track. Nutrition following. 7. Aspiration PNA with acute hypoxemic respiratory failure on 7/4: He was extubated on 7/6. Tracheal aspirate with Klebsiella and citrobacter. Both sensitive to Zosyn. Completed abx 7/13. Possible aspiration 8/8.  - Now on meropenem.    8. Acute Respiratory Failure: Intubated 7/17 in setting of complex GI intervention. Extubated 7/18. Re-intubated 8/8.  Weak respiratory muscles. He has not wanted a tracheostomy.  Extubated on 8/22. .  9. CAD: s/p CABG 2012. No chest pain.  Off aspirin due to GI bleeding. No change.   10. Gout: Severe tophaceous gout.  - Continue uloric. Stop colchicine with diarrhea. Flare seems to have resolved.   11. Delirium: Resolved. No further, though assessment limited back on Vent.  Appreciate neuro input Possibly related to lidocaine.  Lidocaine stopped. CT of head negative. EEG ok. No further workup per neuro. Neuro  reconsulted 8/7 with AMS => ?related to fever/infection.  He has been more clear on vent.  CT head 05/27/17 and 8/10 with lacunar infarcts but do not appear acute.  Seizure activity versus myoclonus => Depakote started.  Switched to [place] with ?drug rash from Depakote.   12. UTI: on vancomycin and meropenum. Urine culture with no growth 13. Unstageable Pressure Ulcer: Sacrum. Consult WOC - Needs to start santyl daily to enzymatically debride. Reposition R to L.  WOC recommendations appreciated.     - Start hydrotherapy today.  14. Bradycardia: With hypotension.  Currently back-up pacing set at 60 bpm.  Does not have atrial lead so pacing asynchronously.  Currently, on and off pacing at rate of 60.    15. Hypothyroidism: May be related to amiodarone, he is on Levoxyl. No change.   16. ID: Suspected septic shock, source = pressure ulcer versus UTI.  Afebrile now, WBCs have come down, hypotension resolved.  Cultures so far negative.  Off vancomycin, remains on meropenem.    17. Rash: ?Drug eruption.  Has not worsened and is faint.  Meropenem versus Depakote may be the culprits. - Depakote stopped and switched for Keppra per Neuro recommendation. Rash resolved 18. Deconditioning: Continue PT. Recommending SNF 19. Hypernatremia: sodium 155. Increase free water boluses to 300 q6 hrs.    I reviewed the HVAD parameters from today, and compared the results to the patient's prior recorded data.  No programming changes were made.  The HVAD is functioning within specified parameters.    Darrick Grinder, NP 06/13/2017, 7:38 AM  VAD Team --- VAD ISSUES ONLY--- Pager [phone] (7am - 7am) Advanced Heart Failure Team  Pager [phone] (M-F;  7a - 4p)  Please contact [facility] for night-coverage after hours (4p -7a ) and weekends on [email or web address]  Patient seen with NP, agree with the above note.   CVP remains 3, no diuretics today.  Keep on compression wraps.   LVAD parameters stable.  Able to sit up for about 30 minutes today with feet down.    Says his breathing is doing ok.   To get hydrotherapy to wound today.    Seen by speech yesterday, remains NPO with tube feeds.   MAP elevated, increase hydralazine to 75 mg tid.  Will add amlodipine tomorrow if still high.   Remains on meropenem, CCM to determine course.  He is afebrile.   Slow progress but has a long way to go.   Loralie Champagne 06/13/2017 12:05 PM

## 2017-06-13 NOTE — Progress Notes (Signed)
LVAD Inpatient Coordinator Rounding Note:  Admitted 04/02/2017 due to A/C Heart failure.   [employer] LVAD implanted on 03/31/2017 by Dr. Laneta Simmers as DT VAD.  Transferred to 4E 05/17/17.  Transferred back to ICU 2H09 on 05/28/17 with possible aspiration pneumonia requiring re-intubation.   Vital signs: Temp: 97.8 HR: 70 Doppler: not done Auto BP: 115/80 (91) O2 Sat: 100 on 2L/[place] Wt in lbs: Marland Kitchen.. 230>229>226>224>235>221>217>223>224>224>234>240>245>235>234>236  LVAD interrogation reveals:  Speed: 2540 Flow: 3.8 Power: 3.4 w Alarms: suction alarm now OFF  Peak:  6.8 Trough: 1.7 HCT: 29.9 Low flow alarm setting: 2.5 High watt alarm setting: 5.5  Suction: OFF  Lavare cycle: on  Blood Products: 6/28> 5 PRBC's, 6 FFP 7/1> 1 PRBC 7/9> 1 PRBC 7/13>3 PRBC 7/14>3 PRBC 7/15> 1 PRBC 7/16>3PRBC 7/17> 3 PRBC 7/20> 1 PRBC 8/6> 1 PRBC 8/10 > 1 PRBC 8/22 > 1 PRBC  Gtts: Milrinone restarted 7/8, stopped 7/16 Levo stopped 04/26/17 restarted 04/22/2017 - stopped 05/07/17 Amiodarone stopped 04/29/17, restarted 7/17 - transitioned to PO 05/13/17; VT on 05/15/17 - IV amio re-started - 30 mg/hr stopped 05/16/17 Heparin 2200 u/hr Lidocaine 05/07/17 - stopped 7/19  TPN - started 04/24/17 for nutritional support, stopped 04/30/17  NG inserted with tube feeding started 05/27/17; placed on hold 05/28/17   Cortrak inserted 06/07/17 - started TF 05/1917   Neuro: 05/27/17- Neurology consult for lethargy, mouth tremors, not vocal, not following commands> Head CT and EEG 05/28/17 - Continual EEG monitoring  05/30/17 - started Depakote for seizure activity - stopped 06/03/17 for rash - will start Keppra   Arrhythmia: 04/24/17 - Afib with RVR - started amiodarone 05/02/17 - 2 sustained episodes of VT - cardioverted x 1; second broke with overdrive pacing 7/17- wide complex tachycardia- Amio bolus x3 and gtt at 60 mg/hr 05/07/17 - sustained VT - converted with overdrive pacing; Lidocaine started in  addition to amiodarone 05/08/17 - Lidocaine stopped due to confusion (lido level 12.4) 05/14/17 - sustained VT - converted 3 separate occassions with overdrive pacing 05/19/17 - sustained VT-converted with overdrive pacing  05/26/17- Afib rate in the 50's 05/27/17 - NSR   Respiratory: 04/23/17 - re-intubated due to respiratory failure secondary to suspected aspiration pneumonia 04/25/17-extubated 05/16/2017- intubated for EGD 04/22/2017- extubated 05/28/17 - re-intubated for suspected aspiration pneumonia 06/11/17 - one way extubation  Drive Line: Daily Dressing Kits with Aquacell AG silver strips per protocol - will advance to every other day dressing changes. Please page VAD Coordinator with site update.  Labs:  LDH trend: 205>212>185>167>167>199>165>151>159>163>181>219>189>173>158>156>156>146>154>188>142>138>127>128>148>164  INR trend: 2.13>2.45>2.25>2.64>2.83>2.92>2.48>2.05>1.93>2.09>2.90>3.15>2.74>2.26>2.09>1.73>1.52>1.40>1.57>1.52>1.39>1.43>1.37  Anticoagulation Plan: -INR Goal: 2-2.5 - warfarin on hold -ASA Dose: 325 mg- on hold  Adverse Events on VAD: - 03/25/2017 Return to [facility] high chest tube output, evacuation of mediastinal hematoma - 04/23/17 Ileus with vomiting; re-intubation for acute respiratory failure; probable aspiration pneumonia -7/13-GI bleed - 7/17 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 He had epinephrine, APC,    and 2 clips.  - 7/19 - Lidocaine gtt stopped due to confusion (lido toxicity) -8/7- Neurology consult- lethargy, mouth tremors, not vocal, not following commands> Head CT and EEG, blood and urine    cultures sent, Ammonia-20 8/8 - re-intubated for possible aspiration pneumonia   Plan/Recommendations:  1. Currently every other day dressing changes, may be able to advance to twice weekly pending dressing change today.  2. We will continue to support pt and family emotionally during this time.  3. Please  page VAD pager for any equipment or patient  needs.   Carlton Adam RN, VAD Coordinator 24/7 pager [phone]

## 2017-06-13 NOTE — Progress Notes (Signed)
Inpatient Rehabilitation  Continuing to follow at a distance for IP Rehab needs; note PT recommending SNF and OT recommending CIR if progression.  Please call with questions.   Charlane Ferretti., CCC/SLP Admission Coordinator  [facility]  Cell [phone]

## 2017-06-13 NOTE — Procedures (Signed)
Objective Swallowing Evaluation: Type of Study: FEES-Fiberoptic Endoscopic Evaluation of Swallow  Patient Details  Name: James [name] MRN: [id number] Date of Birth: 23-Aug-1961  Today's Date: 06/13/2017 Time: SLP Start Time (ACUTE ONLY): 1007-SLP Stop Time (ACUTE ONLY): 1055 SLP Time Calculation (min) (ACUTE ONLY): 48 min  Past Medical History:  Past Medical History:  Diagnosis Date  . AICD (automatic cardioverter/defibrillator) present 09/2015   [facility] [place] VR model [id number] (serial Number [id number]) ICD   . CHF (congestive heart failure) (HCC)   . Chronic edema   . CMI (chronic mesenteric ischemia) (HCC)   . Coronary artery disease   . Dyspnea   . Gout   . Gout   . Hypertension   . Ischemic cardiomyopathy    EF 25%  . Myocardial infarction [facility])    "they saw I'd had one; not sure when but it was before 2012"  . Obesity   . Renal insufficiency   . Type II diabetes mellitus (HCC)    Past Surgical History:  Past Surgical History:  Procedure Laterality Date  . CARDIAC CATHETERIZATION  11/2010  . CARDIAC CATHETERIZATION N/A 07/15/2016   Procedure: Right Heart Cath;  Surgeon: Laurey Morale, MD;  Location: [facility] INVASIVE CV LAB;  Service: Cardiovascular;  Laterality: N/A;  . CORONARY ARTERY BYPASS GRAFT  11/2010   Sequential left internal mammary graft to the mid and distal LAD, sequential sapenous vein graft to the first, second, and third obutuse marginal branches  of the  left circumflex  coronary artery.  Saphenous vein graft to the distal right coronary artery.  . ENTEROSCOPY N/A 05/17/2017   Procedure: ENTEROSCOPY;  Surgeon: Ruffin Frederick, MD;  Location: [facility] ENDOSCOPY;  Service: Gastroenterology;  Laterality: N/A;  . EP IMPLANTABLE DEVICE N/A 10/06/2015   Procedure: ICD Implant;  Surgeon: Will Jorja Loa, MD; [facility] [place] VR model [id number] (serial Number [id number]) ICD implanted L chest   . HOT HEMOSTASIS N/A 04/26/2017   Procedure: HOT  HEMOSTASIS (ARGON PLASMA COAGULATION/BICAP);  Surgeon: Ruffin Frederick, MD;  Location[facility] ENDOSCOPY;  Service: Gastroenterology;  Laterality: N/A;  . INSERTION OF IMPLANTABLE LEFT VENTRICULAR ASSIST DEVICE N/A 03/25/2017   Procedure: INSERTION OF IMPLANTABLE LEFT VENTRICULAR ASSIST DEVICE;  Surgeon: Alleen Borne, MD;  Location: [facility] OR;  Service: Open Heart Surgery;  Laterality: N/A;  CIRC ARREST  NITRIC OXIDE  . RIGHT HEART CATH N/A 11/27/2016   Procedure: Right Heart Cath;  Surgeon: Laurey Morale, MD;  Location: [facility] INVASIVE CV LAB;  Service: Cardiovascular;  Laterality: N/A;  . RIGHT HEART CATH N/A 03/31/2017   Procedure: Right Heart Cath;  Surgeon: Laurey Morale, MD;  Location: [facility] INVASIVE CV LAB;  Service: Cardiovascular;  Laterality: N/A;  . RIGHT HEART CATH N/A 04/15/2017   Procedure: Right Heart Cath;  Surgeon: Laurey Morale, MD;  Location: [facility] INVASIVE CV LAB;  Service: Cardiovascular;  Laterality: N/A;  . SHOULDER OPEN ROTATOR CUFF REPAIR Right ~ 2000  . TEE WITHOUT CARDIOVERSION N/A 04/18/2017   Procedure: TRANSESOPHAGEAL ECHOCARDIOGRAM (TEE);  Surgeon: Alleen Borne, MD;  Location: [facility] OR;  Service: Open Heart Surgery;  Laterality: N/A;   HPI: 56 year old male admitted 03/24/2017 for LVAD placement. PMH significant for gout, DM2, HTN, CKD, CAD s/p CABGx6 (2012), systemic heart failure, [employer] ICD. 8/7 with decreased MS, confusion.  Head CT with right superior cerebellar artery lacunar infarcts, not acute.  Pt has had 4 intubations this admission.  The first three intubations  were for 24 hour periods; the last 8/8-8/22.   Subjective: Pt resting in bed. Wife present.   Assessment / Plan / Recommendation  CHL IP CLINICAL IMPRESSIONS 06/13/2017  Clinical Impression Pt with significantly deteriorated swallow function secondary to prolonged intubation, deconditioning.  Notable were standing secretions in the larynx; ulceration of left vocal fold.  Right vocal fold was difficult to  visualize.  Speech tasks revealed minimal movement of the lateral pharyngeal walls.  Overall, he presents with decreased mobility of pharyngeal/laryngeal musculature, leading to diffuse residue throughout pharynx, regardless of consistency.  Pt spontaneously swallows 4-6 times per each bolus, but residue remains.  Thin and nectar liquids immediately reach the vocal folds; residue from thicker consistencies eventually spills into larynx with no cough elicited.  Cued cough is weak and pt is unable to eject penetrates from larynx.  He is a high aspiration risk at this time.  For now, recommend continued cortrak for nutrition; NPO except occasional ice chips after oral care.  Results, video were reviewed with pt and his wife; they agree with recommendations.  We discussed positive prognosis for swallowing, but acknowledged it will likely be a slow process.   SLP Visit Diagnosis Dysphagia, pharyngoesophageal phase (R13.14)  Attention and concentration deficit following --  Frontal lobe and executive function deficit following --  Impact on safety and function Severe aspiration risk      CHL IP TREATMENT RECOMMENDATION 06/13/2017  Treatment Recommendations Therapy as outlined in treatment plan below     Prognosis 06/13/2017  Prognosis for Safe Diet Advancement Good  Barriers to Reach Goals Severity of deficits  Barriers/Prognosis Comment --    CHL IP DIET RECOMMENDATION 06/13/2017  SLP Diet Recommendations NPO;Ice chips PRN after oral care  Liquid Administration via --  Medication Administration Via alternative means  Compensations --  Postural Changes --      CHL IP OTHER RECOMMENDATIONS 06/13/2017  Recommended Consults --  Oral Care Recommendations Oral care QID  Other Recommendations Have oral suction available      CHL IP FOLLOW UP RECOMMENDATIONS 06/13/2017  Follow up Recommendations 24 hour supervision/assistance      CHL IP FREQUENCY AND DURATION 06/13/2017  Speech Therapy Frequency  (ACUTE ONLY) min 3x week  Treatment Duration 2 weeks           CHL IP ORAL PHASE 06/13/2017  Oral Phase WFL  Oral - Pudding Teaspoon --  Oral - Pudding Cup --  Oral - Honey Teaspoon --  Oral - Honey Cup --  Oral - Nectar Teaspoon --  Oral - Nectar Cup --  Oral - Nectar Straw --  Oral - Thin Teaspoon --  Oral - Thin Cup --  Oral - Thin Straw --  Oral - Puree --  Oral - Mech Soft --  Oral - Regular --  Oral - Multi-Consistency --  Oral - Pill --  Oral Phase - Comment --    CHL IP PHARYNGEAL PHASE 06/13/2017  Pharyngeal Phase Impaired  Pharyngeal- Pudding Teaspoon --  Pharyngeal --  Pharyngeal- Pudding Cup --  Pharyngeal --  Pharyngeal- Honey Teaspoon --  Pharyngeal --  Pharyngeal- Honey Cup --  Pharyngeal --  Pharyngeal- Nectar Teaspoon Delayed swallow initiation-pyriform sinuses;Reduced pharyngeal peristalsis;Reduced epiglottic inversion;Reduced laryngeal elevation;Reduced airway/laryngeal closure;Reduced tongue base retraction;Penetration/Apiration after swallow;Pharyngeal residue - valleculae;Pharyngeal residue - pyriform;Pharyngeal residue - posterior pharnyx;Inter-arytenoid space residue  Pharyngeal Material enters airway, passes BELOW cords without attempt by patient to eject out (silent aspiration)  Pharyngeal- Nectar Cup NT  Pharyngeal --  Pharyngeal- Nectar Straw --  Pharyngeal --  Pharyngeal- Thin Teaspoon Delayed swallow initiation-pyriform sinuses;Reduced pharyngeal peristalsis;Reduced epiglottic inversion;Reduced laryngeal elevation;Reduced airway/laryngeal closure;Reduced tongue base retraction;Penetration/Apiration after swallow;Pharyngeal residue - valleculae;Pharyngeal residue - pyriform;Pharyngeal residue - posterior pharnyx;Inter-arytenoid space residue;Trace aspiration  Pharyngeal Material enters airway, passes BELOW cords without attempt by patient to eject out (silent aspiration)  Pharyngeal- Thin Cup NT  Pharyngeal --  Pharyngeal- Thin Straw NT   Pharyngeal --  Pharyngeal- Puree Delayed swallow initiation-vallecula;Reduced laryngeal elevation;Reduced tongue base retraction;Penetration/Aspiration during swallow;Penetration/Apiration after swallow;Pharyngeal residue - valleculae;Pharyngeal residue - pyriform;Pharyngeal residue - posterior pharnyx;Inter-arytenoid space residue  Pharyngeal Material enters airway, remains ABOVE vocal cords and not ejected out  Pharyngeal- Mechanical Soft NT  Pharyngeal --  Pharyngeal- Regular --  Pharyngeal --  Pharyngeal- Multi-consistency --  Pharyngeal --  Pharyngeal- Pill --  Pharyngeal --  Pharyngeal Comment --     CHL IP CERVICAL ESOPHAGEAL PHASE 06/13/2017  Cervical Esophageal Phase (No Data)  Pudding Teaspoon --  Pudding Cup --  Honey Teaspoon --  Honey Cup --  Nectar Teaspoon --  Nectar Cup --  Nectar Straw --  Thin Teaspoon --  Thin Cup --  Thin Straw --  Puree --  Mechanical Soft --  Regular --  Multi-consistency --  Pill --  Cervical Esophageal Comment --    No flowsheet data found.  Blenda Mounts Laurice 06/13/2017, 11:23 AM

## 2017-06-13 NOTE — Progress Notes (Signed)
Physical Therapy Treatment Patient Details Name: James [name] MRN: [id number] DOB: Oct 07, 1961 Today's Date: 06/13/2017    History of Present Illness Pt adm with acute on chronic heart failure and underwent Heartware HVAD implant on 6/28. Returned to OR later that day for evacuation of mediastinal hematoma. On 7/4 developed ileus with likely aspiration and intubated 7/4-7/6.   Pt developed Heme + stools with progressive anemia as well as  [name] 05/02/17.  He underwent EGD with showed suspected AVM 05/11/2017. He was intubated for procedure and extubated 05/08/17.   He developed confusion 8/7 - head CT showed small bil. subdural hygromas or hematomas, not significantly changed since 05/08/17; and Rt superior cerebellar lacunar infarct which were new since 7/19, but have a chronic appearance. He developed worsened respirtory status 8/8 requiring intubation; developed gout flare up; and was extubated (one way as pt does not desire trach) on 8/23.   PMH - gout, DMII, HTN, CKD, CAD S/P CABGx6 2012, chronic systolic heart failure and [employer] jude ICD.    PT Comments    Pt admitted with above diagnosis. Pt currently with functional limitations due to balance, strength and endurance deficits. Pt was able to sit in full chair position for 30 min with varying support at times but most of time min guard assist.  Pt working very hard during this session and tolerated treatment beautifully.  Feel that REhab should be reconsulted as pt is progressing and will need rehab.   Pt will benefit from skilled PT to increase their independence and safety with mobility to allow discharge to the venue listed below.     Follow Up Recommendations  Supervision/Assistance - 24 hour;CIR     Equipment Recommendations  Other (comment) (TBD next venue)    Recommendations for Other Services Rehab consult     Precautions / Restrictions Precautions Precautions: Fall Precaution Comments: Heartware HVAD Restrictions Weight Bearing  Restrictions: No    Mobility  Bed Mobility Overal bed mobility: Needs Assistance   Rolling: Max assist;+2 for physical assistance         General bed mobility comments: Pt rolled Lt and Rt for peri care   Transfers                 General transfer comment: Unable to attempt   Ambulation/Gait                 Stairs            Wheelchair Mobility    Modified Rankin (Stroke Patients Only)       Balance Overall balance assessment: Needs assistance Sitting-balance support: Feet supported Sitting balance-Leahy Scale: Poor Sitting balance - Comments: Pt moved into chair position while in the bed and was able to sit unupported x ~30 mins with UE support and occasional min A due to mild Lt lean.  Worked on postural control in sitting as well as UE and LB exercise. Pt also was assisted with brushing teeth.   Postural control: Left lateral lean                                  Cognition Arousal/Alertness: Awake/alert Behavior During Therapy: Flat affect Overall Cognitive Status: Impaired/Different from baseline Area of Impairment: Attention;Memory;Following commands;Problem solving                   Current Attention Level: Sustained Memory: Decreased recall of precautions;Decreased short-term memory Following Commands: Follows  one step commands consistently;Follows one step commands inconsistently     Problem Solving: Slow processing;Decreased initiation;Difficulty sequencing;Requires verbal cues;Requires tactile cues General Comments: Pt slow to respond.  He requires mod cues to recall swallowing precautions       Exercises General Exercises - Upper Extremity Shoulder Flexion: AAROM;Right;Left;Seated;20 reps General Exercises - Lower Extremity Long Arc Quad: AAROM;Right;Left;20 reps;Seated    General Comments General comments (skin integrity, edema, etc.): Wife present. VSS.  Pt very motivated.  Trying to get pt to use voice.        Pertinent Vitals/Pain Pain Assessment: Faces Faces Pain Scale: Hurts little more Pain Location: Lt UE and Lt LE  Pain Descriptors / Indicators: Sore;Grimacing;Guarding Pain Intervention(s): Limited activity within patient's tolerance;Monitored during session;Repositioned   VSS throughout Home Living Family/patient expects to be discharged to:: Private residence Living Arrangements: Spouse/significant other Available Help at Discharge: Family;Available PRN/intermittently Type of Home: House Home Access: Level entry   Home Layout: Multi-level Home Equipment: Bedside commode;Crutches Additional Comments: pt reports deficits with peri care due to hand strength at times    Prior Function Level of Independence: Independent      Comments: was independent prior to admission, walking laps and going to the grocery store   PT Goals (current goals can now be found in the care plan section) Acute Rehab PT Goals Patient Stated Goal: to go home Progress towards PT goals: Progressing toward goals    Frequency    Min 3X/week      PT Plan Discharge plan needs to be updated    Co-evaluation PT/OT/SLP Co-Evaluation/Treatment: Yes Reason for Co-Treatment: Complexity of the patient's impairments (multi-system involvement) PT goals addressed during session: Mobility/safety with mobility OT goals addressed during session: ADL's and self-care      AM-PAC PT "6 Clicks" Daily Activity  Outcome Measure  Difficulty turning over in bed (including adjusting bedclothes, sheets and blankets)?: Unable Difficulty moving from lying on back to sitting on the side of the bed? : Unable Difficulty sitting down on and standing up from a chair with arms (e.g., wheelchair, bedside commode, etc,.)?: Unable Help needed moving to and from a bed to chair (including a wheelchair)?: Total Help needed walking in hospital room?: Total Help needed climbing 3-5 steps with a railing? : Total 6 Click Score:  6    End of Session Equipment Utilized During Treatment: Oxygen Activity Tolerance: Patient tolerated treatment well Patient left: with call bell/phone within reach;with family/visitor present;in bed Nurse Communication: Mobility status (nurse to place pt in full chair position this weekend) PT Visit Diagnosis: Muscle weakness (generalized) (M62.81);Difficulty in walking, not elsewhere classified (R26.2);Other abnormalities of gait and mobility (R26.89);Pain Pain - Right/Left: Left Pain - part of body: Knee;Leg;Shoulder;Arm;Hand;Ankle and joints of foot;Hip     Time: 7829-5621 PT Time Calculation (min) (ACUTE ONLY): 51 min  Charges:  $Therapeutic Activity: 8-22 mins                    G Codes:       Yaphet Smethurst,PT Acute Rehabilitation [phone] (pager)    Berline Lopes 06/13/2017, 1:28 PM

## 2017-06-13 NOTE — Progress Notes (Addendum)
Nutrition Follow Up   DOCUMENTATION CODES:   Severe malnutrition in context of chronic illness  INTERVENTION:    Continue Nepro with Carb Steady at goal rate of 35 ml/hr. Prostat 60 ml BID.  TF regimen providing 1912 kcals, 128 gm protein, 611 ml free water daily  NUTRITION DIAGNOSIS:   Malnutrition (severe) related to chronic illness (CHF) as evidenced by severe depletion of body fat, severe depletion of muscle mass, ongoing  GOAL:   Patient will meet greater than or equal to 90% of their needs, met  MONITOR:   TF tolerance, Labs, Weight trends, Skin, I & O's  ASSESSMENT:   56 yo male with hx of HTN, gout, obesity, chronic edema, CAD, ischemic cardiomyopathy, DM2, AICD, MI, CHF, renal insufficiency who was admitted on 6/22 with acute on chronic systolic heart failure.  6/28  HVAD placement 7/05  TPN started 7/06  Extubated 7/11  TPN D/C'd 8/08  Cortrak placed  Pt s/p one way extubation 8/22. Nepro formula currently infusing at goal rate of 35 ml/hr via Cortrak feeding tube. S/p FEES this AM per Speech Path. Pt at severe aspiration risk.  Labs reviewed. Na 155 (H). CBG's [id number]. PCCM and Heart Failure Team notes reviewed.  Free water flushes at 300 ml every 6 hours.  Diet Order:  Diet NPO time specified Except for: Ice Chips  Skin:  unstageable pressure injury; sacrum   Last BM:  8/24   Height:   Ht Readings from Last 1 Encounters:  05/31/17 6' (1.829 m)   Weight:   Wt Readings from Last 1 Encounters:  06/13/17 236 lb (107 kg)  Admit weight 210 lb (95.3 kg)  Ideal Body Weight:  80.9 kg  BMI:  Body mass index is 32.01 kg/m. highly skewed   Estimated Nutritional Needs:   Kcal:  2000-2200  Protein:  115-130 gm  Fluid:  per MD  EDUCATION NEEDS:   No education needs identified at this time  Arthur Holms, RD, LDN Pager #: [phone] After-Hours Pager #: [phone]

## 2017-06-14 DIAGNOSIS — M1000 Idiopathic gout, unspecified site: Secondary | ICD-10-CM

## 2017-06-14 DIAGNOSIS — L89150 Pressure ulcer of sacral region, unstageable: Secondary | ICD-10-CM

## 2017-06-14 LAB — BASIC METABOLIC PANEL
ANION GAP: 4 — AB (ref 5–15)
BUN: 58 mg/dL — AB (ref 6–20)
CALCIUM: 8.4 mg/dL — AB (ref 8.9–10.3)
CO2: 27 mmol/L (ref 22–32)
Chloride: 123 mmol/L — ABNORMAL HIGH (ref 101–111)
Creatinine, Ser: 1.43 mg/dL — ABNORMAL HIGH (ref 0.61–1.24)
GFR calc Af Amer: >60 mL/min (ref >60)
GFR calc non Af Amer: 54 mL/min — ABNORMAL LOW (ref >60)
GLUCOSE: 129 mg/dL — AB (ref 65–99)
Potassium: 3.9 mmol/L (ref 3.5–5.1)
SODIUM: 154 mmol/L — AB (ref 135–145)

## 2017-06-14 LAB — PROTIME-INR
INR: 1.36
Prothrombin Time: 16.9 seconds — ABNORMAL HIGH (ref 11.4–15.2)

## 2017-06-14 LAB — GLUCOSE, CAPILLARY
GLUCOSE-CAPILLARY: 145 mg/dL — AB (ref 65–99)
GLUCOSE-CAPILLARY: 185 mg/dL — AB (ref 65–99)
Glucose-Capillary: 131 mg/dL — ABNORMAL HIGH (ref 65–99)
Glucose-Capillary: 135 mg/dL — ABNORMAL HIGH (ref 65–99)
Glucose-Capillary: 176 mg/dL — ABNORMAL HIGH (ref 65–99)
Glucose-Capillary: 227 mg/dL — ABNORMAL HIGH (ref 65–99)

## 2017-06-14 LAB — COOXEMETRY PANEL
Carboxyhemoglobin: 1.8 % — ABNORMAL HIGH (ref 0.5–1.5)
METHEMOGLOBIN: 0.9 % (ref 0.0–1.5)
O2 Saturation: 82.2 %
TOTAL HEMOGLOBIN: 9.9 g/dL — AB (ref 12.0–16.0)

## 2017-06-14 LAB — CBC
HEMATOCRIT: 35.4 % — AB (ref 39.0–52.0)
Hemoglobin: 10.9 g/dL — ABNORMAL LOW (ref 13.0–17.0)
MCH: 28.3 pg (ref 26.0–34.0)
MCHC: 30.8 g/dL (ref 30.0–36.0)
MCV: 91.9 fL (ref 78.0–100.0)
Platelets: 278 10*3/uL (ref 150–400)
RBC: 3.85 MIL/uL — ABNORMAL LOW (ref 4.22–5.81)
RDW: 19.7 % — AB (ref 11.5–15.5)
WBC: 11.1 10*3/uL — ABNORMAL HIGH (ref 4.0–10.5)

## 2017-06-14 LAB — HEPARIN LEVEL (UNFRACTIONATED): HEPARIN UNFRACTIONATED: 0.39 [IU]/mL (ref 0.30–0.70)

## 2017-06-14 LAB — LACTATE DEHYDROGENASE: LDH: 154 U/L (ref 98–192)

## 2017-06-14 MED ORDER — PREDNISONE 10 MG PO TABS
10.0000 mg | ORAL_TABLET | Freq: Every day | ORAL | Status: DC
  Administered 2017-06-15: 10 mg via ORAL
  Filled 2017-06-14 (×2): qty 1

## 2017-06-14 MED ORDER — WARFARIN SODIUM 2 MG PO TABS
2.0000 mg | ORAL_TABLET | Freq: Once | ORAL | Status: AC
  Administered 2017-06-14: 2 mg via ORAL
  Filled 2017-06-14: qty 1

## 2017-06-14 MED ORDER — SILVER NITRATE-POT NITRATE 75-25 % EX MISC
3.0000 | CUTANEOUS | Status: DC | PRN
  Filled 2017-06-14: qty 3

## 2017-06-14 MED ORDER — HYDRALAZINE HCL 50 MG PO TABS
100.0000 mg | ORAL_TABLET | Freq: Three times a day (TID) | ORAL | Status: DC
  Administered 2017-06-14 – 2017-06-25 (×30): 100 mg via ORAL
  Filled 2017-06-14 (×33): qty 2

## 2017-06-14 NOTE — Progress Notes (Signed)
Patient ID: James [name], male   DOB: [birth year], 56 y.o.   MRN: [id number]   Advanced Heart Failure VAD Team Note  Subjective:    Events: --HVAD placed 6/28.  Returned to the OR that evening with high chest tube output, evacuation of mediastinal hematoma.  --Extubated 6/29. Milrinone stopped on 7/4. --7/4, patient developed ileus with respiratory compromise. NGT placed with 3 L suctioned out.  CXR with suspicion for aspiration PNA.  Patient had to be intubated.  He went into atrial fibrillation with RVR.  He became hypotensive and was started on norepinephrine and phenylephrine.  Amiodarone gtt begun.    --Extubated again on 7/6.  -- 7/13 Had 2 sustained episodes of VT. Had to be cardioverted 1. Second episode broke with overdrive pacing with Dr. Caryl Comes. VAD speed turned down to 2700.  --Melena due to acuteGI bleed --> 2 units PRBCs 7/14, 2 units 7/15. 3 U PRBCs 7/16,  05/01/2017 3 UPRBCs.  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  He had epinephrine, APC, and 2 clips. Intubated prior to procedure and placed on norepinephrine. Speed dropped to 2660.  --7/18 Back in VT with overdrive pacing. Lidocaine was started in addition to amiodarone. Extubated. --7/19 lidocaine stopped with confusion. Neuro consulted.  EEG normal.  CT of head no acute findings. 7/20 confusion had resolved.  --1 unit PRBCs 7/20.  --7/22, developed sustained VT with rate around 130 shortly after getting IV Lasix.  We were able to pace him out and back to NSR.  He was put back on amiodarone gtt.  --More VT on 7/25, paced out.  Back on IV amiodarone gtt.  NSR.  -- 7/31 Ramp ECHO decrease speed 2600 rpm.  -- 8/1 VT- paced out -- 8/2 VT - started on sotalol -- 8/6 Hgb 7.5, 1 unit PRBCs -- 8/7 Hgb 9.5, confused.  Head CT with right superior cerebellar artery lacunar infarcts, not acute. -- 8/8 Possible aspiration. Reintubated. -- 8/10 possible seizure => Depakote.  CT head no  change.  1 unit PRBCs.  -- Multiple suction alarms on 8/17 night which persisted so suction alarm deactivated.  -- Extubated 8/23  Remains weak but able to sit up on side of bed today and yesterday. Wants to drink but only able to get ice chips. Denies dyspnea. MAPs running in mid 90s. Still with loose stools  Remains on heparin gtt + warfarin.    HVAD INTERROGATION:  HVAD:  Flow 3.0 liters/min, speed 2540,  power 3.3 W,  Peak 6.1 Trough 0.0. Suction alarms off. [place] On.     Objective:    Vital Signs:   Temp:  [97.5 F (36.4 C)-98.1 F (36.7 C)] 98.1 F (36.7 C) (08/25 0800) Pulse Rate:  [60-71] 66 (08/25 0800) Resp:  [19-26] 24 (08/25 0800) BP: (104-127)/(80-98) 117/86 (08/25 0800) SpO2:  [97 %-100 %] 100 % (08/25 0800) Weight:  [108.4 kg (239 lb)] 108.4 kg (239 lb) (08/25 0500) Last BM Date: 06/13/17 Mean arterial Pressure 95 Intake/Output:   Intake/Output Summary (Last 24 hours) at 06/14/17 0838 Last data filed at 06/14/17 0800  Gross per 24 hour  Intake             2358 ml  Output             2825 ml  Net             -467 ml     Physical Exam    Physical Exam: CVP  6-7 General:  Lying in bed fatigued HEENT: normal Cortrak in place. + temporal wasting Neck: supple. JVP not elevated.  Carotids 2+ bilat; no bruits. No lymphadenopathy or thryomegaly appreciated. Cor: LVAD hum.  Lungs: Clear anteriorly  Abdomen: soft, nontender, non-distended. No hepatosplenomegaly. No bruits or masses. Good bowel sounds. Driveline site clean. Anchor in place.  Extremities: no cyanosis, clubbing, rash. Diffuse gouty changes. LEs wrapped. Some thigh edema. LUE edematous.   Neuro: alert & oriented x 3. No focal deficits. Moves all 4 without problem    Telemetry   Personally reviewed Vpaced 60-65s   Labs   Basic Metabolic Panel:  Recent Labs Lab 06/09/17 0317 06/10/17 0153 06/11/17 0224 06/12/17 0713 06/13/17 0248 06/14/17 0253  NA 153* 153* 156* 151* 155* 154*  K 4.1  3.9 4.1 4.0 4.0 3.9  CL 123* 122* 124* 122* 121* 123*  CO2 _0 GLUCOSE 111* 109* 116* 136* 110* 129*  BUN 71* 69* 63* 59* 60* 58*  CREATININE 1.51* 1.51* 1.39* 1.48* 1.41* 1.43*  CALCIUM 8.1* 8.3* 8.5* 8.0* 8.5* 8.4*  MG 2.2  --   --   --   --   --     Liver Function Tests: No results for input(s): AST, ALT, ALKPHOS, BILITOT, PROT, ALBUMIN in the last 168 hours. No results for input(s): LIPASE, AMYLASE in the last 168 hours. No results for input(s): AMMONIA in the last 168 hours.  CBC:  Recent Labs Lab 06/10/17 0153 06/11/17 0224 06/12/17 0713 06/13/17 0248 06/14/17 0253  WBC 12.7* 13.5* 12.5* 13.1* 11.1*  HGB 8.3* 7.7* 8.9* 9.2* 10.9*  HCT 26.5* 25.0* 29.7* 29.9* 35.4*  MCV 91.1 91.2 91.1 91.7 91.9  PLT 295 289 275 279 278    INR:  Recent Labs Lab 06/10/17 0153 06/11/17 0224 06/12/17 0713 06/13/17 0248 06/14/17 0253  INR 1.50 1.39 1.43 1.37 1.36    Other results:     Imaging   Dg Chest Port 1 View  Result Date: 06/12/2017 CLINICAL DATA:  Shortness of breath. EXAM: PORTABLE CHEST 1 VIEW COMPARISON:  06/08/2017. FINDINGS: Interim extubation. Feeding tube noted with its tip over the stomach. Prior CABG. Left ventricular assist device noted in stable position . Cardiomegaly with pulmonary venous congestion. Basilar atelectasis. No pleural effusion or pneumothorax . IMPRESSION: 1. Interim extubation. Feeding tube noted with its tip over the stomach. 2. Prior CABG. Left ventricular assist device in stable position. Cardiomegaly with pulmonary venous congestion. 3. Basilar atelectasis. Small left pleural effusion . Chest is stable from prior exam. Electronically Signed   By: Marcello Moores  Register   On: 06/12/2017 09:08     Medications:     Scheduled Medications: . amiodarone  200 mg Per Tube BID  . chlorhexidine gluconate (MEDLINE KIT)  15 mL Mouth Rinse BID  . Chlorhexidine Gluconate Cloth  6 each Topical q morning - 10a  . collagenase   Topical  Daily  . febuxostat  120 mg Oral Daily  . feeding supplement (NEPRO CARB STEADY)  1,000 mL Per Tube Q24H  . feeding supplement (PRO-STAT SUGAR FREE 64)  60 mL Per Tube BID  . free water  300 mL Per Tube Q6H  . hydrALAZINE  75 mg Oral Q8H  . insulin aspart  0-15 Units Subcutaneous Q4H  . levETIRAcetam  500 mg Per Tube BID  . levothyroxine  50 mcg Per NG tube QAC breakfast  . magnesium oxide  400 mg Per Tube Daily  . mouth rinse  15 mL Mouth Rinse Q4H  . mexiletine  300 mg Per Tube Q12H  . pantoprazole sodium  40 mg Per Tube BID  . predniSONE  20 mg Per Tube Q breakfast  . sodium chloride flush  10-40 mL Intracatheter Q12H  . sotalol  80 mg Per Tube Daily  . Warfarin - Pharmacist Dosing Inpatient   Does not apply q1800    Infusions: . sodium chloride 10 mL/hr at 06/14/17 0700  . heparin 1,600 Units/hr (06/14/17 0700)  . morphine    . norepinephrine (LEVOPHED) Adult infusion Stopped (06/04/17 2250)    PRN Medications: acetaminophen (TYLENOL) oral liquid 160 mg/5 mL, albuterol, docusate, fentaNYL (SUBLIMAZE) injection, Gerhardt's butt cream, hydrALAZINE, midazolam, morphine injection, morphine, neomycin-bacitracin-polymyxin, ondansetron (ZOFRAN) IV, oxyCODONE, sodium chloride flush   Patient Profile   56 yo with CAD s/p CABG, ischemic cardiomyopathy/chronic systolic CHF, tophaceous gout, and CKD stage 3 was admitted for diuresis and consideration for LVAD placement. S/p HVAD on 6/28  Assessment/Plan:    1. Acute/chronic systolic CHF s/p HVAD: Ischemic cardiomyopathy.  St Jude ICD.  Echo (6/18) with EF 15%, mildly dilated RV with moderately decreased systolic function.  s/p HVAD placement 6/28 and had to return to OR to evacuate mediastinal hematoma.  He had been weaned off pressors/milrinone, but developed ileus w/ likely aspiration event 7/4, re-intubated, developed afib/RVR requiring norepinephrine. Extubated 7/6.  VAD speed turned down to 2540 with VT. VAD parameters currently look  good.  Milrinone stopped 7/17 with recurrent VT.  He was started back on norepinephrine 8/9 due to soft BP after intubation and suspected septic shock, now off norepinephrine.  MAP 80s-90, getting hydralazine per tube. Volume status fine centrally with CVP 7. Main issue is massive 3rd spacing but low intravascular volume.  Legs now wrapped and weight stable. CVP low. No diuretics.  - Maps high yesterday. Hydralazine increased to 75 mg three times a day. Still up. Will increase hydralazine to 100 tid.  - Off ASA with GI Bleed  - Goal INR 2-2.5. INR 1.36. Discussed with Pharm D. Continue heparin drip + coumadin. Stop heparin drip once INR 2.   - VAD parameters ok. Except trough 0.0. Discussed with VAD coordinator. Flows look fine.  2. AKI on CKD stage 3:  - Back to baseline 1.4 Stable 3. Symptomatic anemia due to acute UGI bleeding:  Received 3 units PRBCs 7/16 and 3 units PRBCs on 05/09/2017. 1 unit PRBCs 7/20. 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  Requiring 2 clips + epi + APC. 1 unit PRBCs 8/6.  He had 1 unit PRBCs 8/10 to provide volume.  1 unit PRBCs on 8/22.    - Got Feraheme 7/28.  - Continue Protonix 40 po bid. - Hgb 10.9 today  4. Ventricular tachycardia: Status post ventricular tachycardia 2 on 7/13. Possible suction event. VAD speed turned down to 2700.  Recurrent VT 7/17. EP called to bedside. Overdrive pacing successful for about 10 minutes but VT recurred.  Milrinone turned off, remained in VT overnight but hemodynamically stable.  Speed decreased to 2600.  On 7/18, we were able to pace him out of VT.  Lidocaine added then stopped due to confusion/mental status changes.  He had VT 7/22 about 30 minutes after getting IV Lasix, had to be paced out again => may have been due to rapid fluid shift.  Pt paced out of VT 3 separate occasions 7/25. Got amio bolus x 2 and started back on IV infusion.  Transitioned to amiodarone 400 mg tid.  On 8/1 had recurrent VT  and paced out again.  Recurrent VT 8/2 per EP sotalol started along with amiodarone, sotalol decreased to once daily with bradycardia.  - Continue mexiletine to 300 mg BID, sotalol 80 mg daily, amio 200 mg BID. - Remains quiescent. Continue to follow closely.  - Keep K > 4.0 Mg > 2.0 5. Paroxysmal Atrial fibrillation:  -Developed atrial fibrillation with RVR in setting of aspiration PNA on 7/4.  In NSR currently on triple AA therapy 6. Malnutrition:   -Continue tube feeds via Cor-track. Nutrition following. 7. Aspiration PNA with acute hypoxemic respiratory failure on 7/4: He was extubated on 7/6. Tracheal aspirate with Klebsiella and citrobacter. Both sensitive to Zosyn. Completed abx 7/13. Possible aspiration 8/8. Treated with meropenem. Stopped 8/24 - Off abx 8. Acute Respiratory Failure: Intubated 7/17 in setting of complex GI intervention. Extubated 7/18. Re-intubated 8/8.  Weak respiratory muscles. He has not wanted a tracheostomy.  Extubated on 8/22. .  - Remains stable off vent 9. CAD: s/p CABG 2012. No chest pain.  Off aspirin due to GI bleeding. No change.   10. Gout: Severe tophaceous gout.  - Continue uloric. Stop colchicine with diarrhea. Flare now resolved. Reduce prednisone to 10 daily. D/w PharmD.  11. Delirium: Appreciate neuro input Possibly related to lidocaine.  Lidocaine stopped. CT of head negative. EEG ok. No further workup per neuro. Neuro  reconsulted 8/7 with AMS => ?related to fever/infection.  He has been more clear on vent.  CT head 05/27/17 and 8/10 with lacunar infarcts but do not appear acute.  Seizure activity versus myoclonus => Depakote started.  Switched to [place] with ?drug rash from Depakote.   - Currently resolved 12. UTI: on vancomycin and meropenum. Urine culture with no growth - Meropenem stopped 8/24 13. Unstageable Pressure Ulcer: Sacrum. Consult WOC - Needs to start santyl daily to enzymatically debride. Reposition R to L. WOC recommendations appreciated.      - Now on hydrotherapy today.  14. Bradycardia: With hypotension.  Currently back-up pacing set at 60 bpm.  Does not have atrial lead so pacing asynchronously.  Currently, on and off pacing at rate of 60.    15. Hypothyroidism: May be related to amiodarone, he is on Levoxyl. No change.   16. ID: See above - Meropenem stopped 8/24    18. Severe Deconditioning: Continue PT. Recommending SNF 19. Hypernatremia: sodium 155-154. Continue free water boluses - 300 q6 hrs.    I reviewed the HVAD parameters from today, and compared the results to the patient's prior recorded data.  No programming changes were made.  The HVAD is functioning within specified parameters.    Glori Bickers, MD 06/14/2017, 8:38 AM  VAD Team --- VAD ISSUES ONLY--- Pager [phone] (7am - 7am) Advanced Heart Failure Team  Pager [phone] (M-F; 7a - 4p)  Please contact [facility] for night-coverage after hours (4p -7a ) and weekends on [email or web address]

## 2017-06-14 NOTE — Progress Notes (Signed)
.  EKG CRITICAL VALUE     12 lead EKG performed.  Critical value noted.  Shanda Bumps, RN notified.   SCALES-PRICE,Nijah Tejera, CCT 06/14/2017 0704AM.

## 2017-06-14 NOTE — Progress Notes (Signed)
ANTICOAGULATION CONSULT NOTE - Follow Up Consult  Pharmacy Consult for Heparin  + Coumadin Indication: HVAD  No Known Allergies  Patient Measurements: Height: 6' (182.9 cm) Weight: 239 lb (108.4 kg) IBW/kg (Calculated) : 77.6  Vital Signs: Temp: 98.1 F (36.7 C) (08/25 0800) Temp Source: Axillary (08/25 0800) BP: 117/86 (08/25 0800) Pulse Rate: 66 (08/25 0800)  Labs:  Recent Labs  06/12/17 0713 06/13/17 0248 06/14/17 0253  HGB 8.9* 9.2* 10.9*  HCT 29.7* 29.9* 35.4*  PLT 275 279 278  LABPROT 17.6* 17.0* 16.9*  INR 1.43 1.37 1.36  HEPARINUNFRC 0.30 0.40 0.39  CREATININE 1.48* 1.41* 1.43*    Estimated Creatinine Clearance: 75.1 mL/min (A) (by C-G formula based on SCr of 1.43 mg/dL (H)).  Assessment: 56 y/o M continues on heparin s/p HVAD on 04/07/2017.  No overt bleeding noted, CBC stable   Heparin level 0.39 this am within goal range on heparin drip rate 1600 uts/hr.  Pharmacy asked to resume Coumadin 8/22.  Has been very sensitive to Coumadin earlier this admission.  Will dose conservatively.  Goal of Therapy:  Heparin level 0.3-0.5 units/mL INR 2-2.5 Monitor platelets by anticoagulation protocol: Yes   Plan:  Continue heparin drip 1600 uts/hr Daily heparin level and CBC. Coumadin 2 mg x 1 tonight. Daily INR.  Leota Sauers Pharm.D. CPP, BCPS Clinical Pharmacist [phone] 06/14/2017 9:00 AM

## 2017-06-14 NOTE — Progress Notes (Signed)
Physical Therapy Treatment Patient Details Name: James [name] MRN: [id number] DOB: October 03, 1961 Today's Date: 06/14/2017    History of Present Illness Pt adm with acute on chronic heart failure and underwent Heart[employer] HVAD implant on 6/28. Returned to OR later that day for evacuation of mediastinal hematoma. On 7/4 developed ileus with likely aspiration and intubated 7/4-7/6.   Pt developed Heme + stools with progressive anemia as well as  [name] 05/02/17.  He underwent EGD with showed suspected AVM 04/22/2017. He was intubated for procedure and extubated 05/08/17.   He developed confusion 8/7 - head CT showed small bil. subdural hygromas or hematomas, not significantly changed since 05/08/17; and Rt superior cerebellar lacunar infarct which were new since 7/19, but have a chronic appearance. He developed worsened respirtory status 8/8 requiring intubation; developed gout flare up; and was extubated (one way as pt does not desire trach) on 8/23.   PMH - gout, DMII, HTN, CKD, CAD S/P CABGx6 2012, chronic systolic heart failure and St jude ICD.    PT Comments    Patient seen in conjunction with Nsg and therapy technician for OOB activity. Utilized maxi sky lift to position patient OOB in chair. Performed activity on 1 liter [place], VSS throughout. Patient was lethargic during session. Will continue to see and progress as tolerated. Current POC remains appropriate.   Follow Up Recommendations  Supervision/Assistance - 24 hour;CIR     Equipment Recommendations  Other (comment) (TBD next venue)    Recommendations for Other Services Rehab consult     Precautions / Restrictions      Mobility  Bed Mobility                  Transfers Overall transfer level: Needs assistance Equipment used: 2 person hand held assist                Ambulation/Gait                 Stairs            Wheelchair Mobility    Modified Rankin (Stroke Patients Only)       Balance                                             Cognition Arousal/Alertness: Lethargic Behavior During Therapy: Flat affect Overall Cognitive Status: Impaired/Different from baseline Area of Impairment: Attention;Memory;Following commands;Problem solving                   Current Attention Level: Sustained Memory: Decreased recall of precautions;Decreased short-term memory Following Commands: Follows one step commands consistently;Follows one step commands inconsistently     Problem Solving: Slow processing;Decreased initiation;Difficulty sequencing;Requires verbal cues;Requires tactile cues        Exercises      General Comments        Pertinent Vitals/Pain Pain Assessment: Faces Faces Pain Scale: Hurts little more Pain Location: Lt UE and Lt LE  Pain Descriptors / Indicators: Sore;Grimacing;Guarding Pain Intervention(s): Monitored during session    Home Living                      Prior Function            PT Goals (current goals can now be found in the care plan section) Acute Rehab PT Goals Patient Stated Goal: to go home PT Goal  Formulation: With patient Time For Goal Achievement: 06/23/17 Potential to Achieve Goals: Fair Progress towards PT goals: Progressing toward goals    Frequency    Min 3X/week      PT Plan Current plan remains appropriate    Co-evaluation   Reason for Co-Treatment: Complexity of the patient's impairments (multi-system involvement) PT goals addressed during session: Mobility/safety with mobility        AM-PAC PT "6 Clicks" Daily Activity  Outcome Measure  Difficulty turning over in bed (including adjusting bedclothes, sheets and blankets)?: Unable Difficulty moving from lying on back to sitting on the side of the bed? : Unable Difficulty sitting down on and standing up from a chair with arms (e.g., wheelchair, bedside commode, etc,.)?: Unable Help needed moving to and from a bed to chair (including a  wheelchair)?: Total Help needed walking in hospital room?: Total Help needed climbing 3-5 steps with a railing? : Total 6 Click Score: 6    End of Session Equipment Utilized During Treatment: Oxygen   Patient left: in chair;with call bell/phone within reach;with family/visitor present Nurse Communication: Mobility status (nurse to place pt in full chair position this weekend) PT Visit Diagnosis: Muscle weakness (generalized) (M62.81);Difficulty in walking, not elsewhere classified (R26.2);Other abnormalities of gait and mobility (R26.89);Pain Pain - Right/Left: Left Pain - part of body: Knee;Leg;Shoulder;Arm;Hand;Ankle and joints of foot;Hip     Time: 1610-9604 PT Time Calculation (min) (ACUTE ONLY): 24 min  Charges:  $Therapeutic Activity: 23-37 mins                    G Codes:       Charlotte Crumb, PT DPT  Board Certified Neurologic Specialist [phone]    Fabio Asa 06/14/2017, 1:44 PM

## 2017-06-14 NOTE — Progress Notes (Signed)
Physical Therapy Wound Treatment Patient Details  Name: James [name] MRN: [id number] Date of Birth: 1961/04/15  Today's Date: 06/14/2017 Time: 1000-1055 Time Calculation (min): 55 min  Subjective  Subjective: Pt awakens to converse with therapist - states he is agreeable to hydrotherapy and then closes eyes again Patient and Family Stated Goals: healed up so I can do well with therapy  Pain Score: Pain Score: 0-No pain  Wound Assessment  Pressure Injury 05/14/17 Unstageable - Full thickness tissue loss in which the base of the ulcer is covered by slough (yellow, tan, gray, green or brown) and/or eschar (tan, brown or black) in the wound bed. WOC updated, wound evolution to unstageable  (Active)  Dressing Type Gauze (Comment);Foam;Moist to dry 06/14/2017 10:59 AM  Dressing Clean;Dry;Intact 06/14/2017 10:59 AM  Dressing Change Frequency Daily 06/14/2017 10:59 AM  State of Healing Eschar 06/14/2017 10:59 AM  Site / Wound Assessment Bleeding;Red;Yellow 06/14/2017 10:59 AM  % Wound base Red or Granulating 10% 06/14/2017 10:59 AM  % Wound base Yellow/Fibrinous Exudate 85% 06/14/2017 10:59 AM  % Wound base Black/Eschar 5% 06/14/2017 10:59 AM  % Wound base Other/Granulation Tissue (Comment) 0% 06/14/2017 10:59 AM  Peri-wound Assessment Intact;Erythema (blanchable) 06/14/2017 10:59 AM  Wound Length (cm) 6 cm 06/13/2017  1:00 PM  Wound Width (cm) 7 cm 06/13/2017  1:00 PM  Wound Depth (cm) 3 cm 06/13/2017  1:00 PM  Tunneling (cm) 0 05/14/2017  9:00 PM  Undermining (cm) 2 cm at 11*, 1 cm at 6 * 06/13/2017  1:00 PM  Margins Unattached edges (unapproximated) 06/14/2017 10:59 AM  Drainage Amount Moderate 06/14/2017 10:59 AM  Drainage Description Sanguineous 06/14/2017 10:59 AM  Treatment Debridement (Selective);Hydrotherapy (Pulse lavage);Packing (Saline gauze) 06/14/2017 10:59 AM  Santyl applied to wound bed prior to applying dressing.   Hydrotherapy Pulsed lavage therapy - wound location: sacral Pulsed Lavage  with Suction (psi): 12 psi Pulsed Lavage with Suction - Normal Saline Used: 1000 mL Pulsed Lavage Tip: Tip with splash shield Selective Debridement Selective Debridement - Location: sacral Selective Debridement - Tools Used: Forceps;Scissors Selective Debridement - Tissue Removed: Yellow necrotic tissue   Wound Assessment and Plan  Wound Therapy - Assess/Plan/Recommendations Wound Therapy - Clinical Statement: pt will benefit from hydro for the pulsed lavage that will help cleansed wound, decrease bacterial load and otherwise prep wound for selective debridement. Wound Therapy - Functional Problem List: Relative immobility, decreased strength, compromised nutrition and skin integrity.  See PT notes Factors Delaying/Impairing Wound Healing: Incontinence;Infection - systemic/local;Immobility;Multiple medical problems Hydrotherapy Plan: Debridement;Dressing change;Patient/family education;Pulsatile lavage with suction Wound Therapy - Frequency: 6X / week Wound Therapy - Current Recommendations: PT;Case manager/social work;WOC nurse Wound Therapy - Follow Up Recommendations: Home health RN;Skilled nursing facility Wound Plan: see above  Wound Therapy Goals- Improve the function of patient's integumentary system by progressing the wound(s) through the phases of wound healing (inflammation - proliferation - remodeling) by: Decrease Necrotic Tissue to: 25 % Decrease Necrotic Tissue - Progress: Progressing toward goal Increase Granulation Tissue to: 75% including any viable tissues Increase Granulation Tissue - Progress: Progressing toward goal Improve Drainage Characteristics: Min;Serous Improve Drainage Characteristics - Progress: Progressing toward goal Goals/treatment plan/discharge plan were made with and agreed upon by patient/family: Yes Time For Goal Achievement: 7 days Wound Therapy - Potential for Goals: Good  Goals will be updated until maximal potential achieved or discharge  criteria met.  Discharge criteria: when goals achieved, discharge from hospital, MD decision/surgical intervention, no progress towards goals, refusal/missing three consecutive treatments without notification  or medical reason.  GP     Thelma Comp 06/14/2017, 11:10 AM  Rolinda Roan, PT, DPT Acute Rehabilitation Services Pager: [phone]

## 2017-06-15 LAB — COOXEMETRY PANEL
CARBOXYHEMOGLOBIN: 2 % — AB (ref 0.5–1.5)
METHEMOGLOBIN: 1 % (ref 0.0–1.5)
O2 SAT: 73.8 %
Total hemoglobin: 11 g/dL — ABNORMAL LOW (ref 12.0–16.0)

## 2017-06-15 LAB — GLUCOSE, CAPILLARY
GLUCOSE-CAPILLARY: 113 mg/dL — AB (ref 65–99)
GLUCOSE-CAPILLARY: 188 mg/dL — AB (ref 65–99)
GLUCOSE-CAPILLARY: 181 mg/dL — AB (ref 65–99)
Glucose-Capillary: 119 mg/dL — ABNORMAL HIGH (ref 65–99)
Glucose-Capillary: 163 mg/dL — ABNORMAL HIGH (ref 65–99)

## 2017-06-15 LAB — PROTIME-INR
INR: 1.37
PROTHROMBIN TIME: 17 s — AB (ref 11.4–15.2)

## 2017-06-15 LAB — LACTATE DEHYDROGENASE: LDH: 162 U/L (ref 98–192)

## 2017-06-15 LAB — MRSA PCR SCREENING: MRSA BY PCR: NEGATIVE

## 2017-06-15 LAB — HEPARIN LEVEL (UNFRACTIONATED): Heparin Unfractionated: 0.48 IU/mL (ref 0.30–0.70)

## 2017-06-15 MED ORDER — CHLORHEXIDINE GLUCONATE CLOTH 2 % EX PADS
6.0000 | MEDICATED_PAD | Freq: Every day | CUTANEOUS | Status: DC
  Administered 2017-06-16 – 2017-06-17 (×2): 6 via TOPICAL

## 2017-06-15 MED ORDER — CHLORHEXIDINE GLUCONATE 0.12 % MT SOLN
OROMUCOSAL | Status: AC
  Administered 2017-06-15: 15 mL
  Filled 2017-06-15: qty 15

## 2017-06-15 MED ORDER — WARFARIN SODIUM 3 MG PO TABS
3.0000 mg | ORAL_TABLET | Freq: Once | ORAL | Status: AC
  Administered 2017-06-15: 3 mg via ORAL
  Filled 2017-06-15: qty 1

## 2017-06-15 MED ORDER — AMLODIPINE BESYLATE 5 MG PO TABS
5.0000 mg | ORAL_TABLET | Freq: Every day | ORAL | Status: DC
  Administered 2017-06-15: 5 mg via ORAL
  Filled 2017-06-15: qty 1

## 2017-06-15 MED ORDER — DEXTROSE 5 % IV SOLN
Freq: Once | INTRAVENOUS | Status: AC
  Administered 2017-06-15: 12:00:00 via INTRAVENOUS

## 2017-06-15 NOTE — Progress Notes (Addendum)
Patient ID: James [name], male   DOB: June 28, 1961, 56 y.o.   MRN: [id number]   Advanced Heart Failure VAD Team Note  Subjective:    Events: --HVAD placed 6/28.  Returned to the OR that evening with high chest tube output, evacuation of mediastinal hematoma.  --Extubated 6/29. Milrinone stopped on 7/4. --7/4, patient developed ileus with respiratory compromise. NGT placed with 3 L suctioned out.  CXR with suspicion for aspiration PNA.  Patient had to be intubated.  He went into atrial fibrillation with RVR.  He became hypotensive and was started on norepinephrine and phenylephrine.  Amiodarone gtt begun.    --Extubated again on 7/6.  -- 7/13 Had 2 sustained episodes of VT. Had to be cardioverted 1. Second episode broke with overdrive pacing with Dr. Caryl Comes. VAD speed turned down to 2700.  --Melena due to acuteGI bleed --> 2 units PRBCs 7/14, 2 units 7/15. 3 U PRBCs 7/16,  05/18/2017 3 UPRBCs.  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  He had epinephrine, APC, and 2 clips. Intubated prior to procedure and placed on norepinephrine. Speed dropped to 2660.  --7/18 Back in VT with overdrive pacing. Lidocaine was started in addition to amiodarone. Extubated. --7/19 lidocaine stopped with confusion. Neuro consulted.  EEG normal.  CT of head no acute findings. 7/20 confusion had resolved.  --1 unit PRBCs 7/20.  --7/22, developed sustained VT with rate around 130 shortly after getting IV Lasix.  We were able to pace him out and back to NSR.  He was put back on amiodarone gtt.  --More VT on 7/25, paced out.  Back on IV amiodarone gtt.  NSR.  -- 7/31 Ramp ECHO decrease speed 2600 rpm.  -- 8/1 VT- paced out -- 8/2 VT - started on sotalol -- 8/6 Hgb 7.5, 1 unit PRBCs -- 8/7 Hgb 9.5, confused.  Head CT with right superior cerebellar artery lacunar infarcts, not acute. -- 8/8 Possible aspiration. Reintubated. -- 8/10 possible seizure => Depakote.  CT head no  change.  1 unit PRBCs.  -- Multiple suction alarms on 8/17 night which persisted so suction alarm deactivated.  -- Extubated 8/23  Remains weak. Has been up to chair with PT. MAPs up. Not responding much to amlodipine. CVP 6-8   Remains on heparin gtt + warfarin.  INR 1.37   HVAD INTERROGATION:  HVAD:  Flow 3.6 liters/min, speed 2540,  power 4.0 W,  Peak > 12 Trough >12. Suction alarms off. Lavare On.     Objective:    Vital Signs:   Temp:  [97.3 F (36.3 C)-98.1 F (36.7 C)] 98.1 F (36.7 C) (08/26 0743) Pulse Rate:  [60-77] 71 (08/26 1100) Resp:  [18-29] 27 (08/26 1100) BP: (101-141)/(78-104) 123/90 (08/26 1100) SpO2:  [92 %-100 %] 98 % (08/26 1100) Weight:  [106.1 kg (234 lb)] 106.1 kg (234 lb) (08/26 0500) Last BM Date: 06/15/17 Mean arterial Pressure 95-110 Intake/Output:   Intake/Output Summary (Last 24 hours) at 06/15/17 1105 Last data filed at 06/15/17 0800  Gross per 24 hour  Intake             1631 ml  Output             1985 ml  Net             -354 ml     Physical Exam    Physical Exam: CVP 6 General:  Lying in bed fatigued. NAD HEENT: normal Cortrak in place. +  temporal wasting anicteric  Neck: supple. JVP 6-7 Carotids 2+ bilat; no bruits. No lymphadenopathy or thryomegaly appreciated. Cor: LVAD hum.  Lungs: Clear anteriorly. Weak effort  Abdomen: soft, NT/ND  No hepatosplenomegaly. No bruits or masses. Good bowel sounds . Driveline site clean. Anchor in place.  Extremities: no cyanosis, clubbing, rash. Diffuse gouty abnormalities  LEs wrapped. Mild thigh edema. LUE edematous.   Neuro: alert & oriented x 3, cranial nerves grossly intact. moves all 4 extremities w/o difficulty. Affect pleasant   Telemetry   Personally reviewed NSR 70s. Personally reviewed   Labs   Basic Metabolic Panel:  Recent Labs Lab 06/09/17 0317 06/10/17 0153 06/11/17 0224 06/12/17 0713 06/13/17 0248 06/14/17 0253  NA 153* 153* 156* 151* 155* 154*  K 4.1 3.9 4.1 4.0  4.0 3.9  CL 123* 122* 124* 122* 121* 123*  CO2 _0 GLUCOSE 111* 109* 116* 136* 110* 129*  BUN 71* 69* 63* 59* 60* 58*  CREATININE 1.51* 1.51* 1.39* 1.48* 1.41* 1.43*  CALCIUM 8.1* 8.3* 8.5* 8.0* 8.5* 8.4*  MG 2.2  --   --   --   --   --     Liver Function Tests: No results for input(s): AST, ALT, ALKPHOS, BILITOT, PROT, ALBUMIN in the last 168 hours. No results for input(s): LIPASE, AMYLASE in the last 168 hours. No results for input(s): AMMONIA in the last 168 hours.  CBC:  Recent Labs Lab 06/10/17 0153 06/11/17 0224 06/12/17 0713 06/13/17 0248 06/14/17 0253  WBC 12.7* 13.5* 12.5* 13.1* 11.1*  HGB 8.3* 7.7* 8.9* 9.2* 10.9*  HCT 26.5* 25.0* 29.7* 29.9* 35.4*  MCV 91.1 91.2 91.1 91.7 91.9  PLT 295 289 275 279 278    INR:  Recent Labs Lab 06/11/17 0224 06/12/17 0713 06/13/17 0248 06/14/17 0253 06/15/17 0426  INR 1.39 1.43 1.37 1.36 1.37    Other results:     Imaging   No results found.   Medications:     Scheduled Medications: . amiodarone  200 mg Per Tube BID  . chlorhexidine gluconate (MEDLINE KIT)  15 mL Mouth Rinse BID  . [START ON 06/16/2017] Chlorhexidine Gluconate Cloth  6 each Topical Q0600  . collagenase   Topical Daily  . febuxostat  120 mg Oral Daily  . feeding supplement (NEPRO CARB STEADY)  1,000 mL Per Tube Q24H  . feeding supplement (PRO-STAT SUGAR FREE 64)  60 mL Per Tube BID  . free water  300 mL Per Tube Q6H  . hydrALAZINE  100 mg Oral Q8H  . insulin aspart  0-15 Units Subcutaneous Q4H  . levETIRAcetam  500 mg Per Tube BID  . levothyroxine  50 mcg Per NG tube QAC breakfast  . magnesium oxide  400 mg Per Tube Daily  . mouth rinse  15 mL Mouth Rinse Q4H  . mexiletine  300 mg Per Tube Q12H  . pantoprazole sodium  40 mg Per Tube BID  . predniSONE  10 mg Oral Q breakfast  . sodium chloride flush  10-40 mL Intracatheter Q12H  . sotalol  80 mg Per Tube Daily  . warfarin  3 mg Oral ONCE-1800  . Warfarin - Pharmacist  Dosing Inpatient   Does not apply q1800    Infusions: . sodium chloride 10 mL/hr at 06/15/17 0700  . heparin 1,600 Units/hr (06/15/17 0800)  . norepinephrine (LEVOPHED) Adult infusion Stopped (06/04/17 2250)    PRN Medications: acetaminophen (TYLENOL) oral liquid 160 mg/5 mL, albuterol, docusate, fentaNYL (SUBLIMAZE)  injection, Gerhardt's butt cream, hydrALAZINE, midazolam, morphine injection, morphine, neomycin-bacitracin-polymyxin, ondansetron (ZOFRAN) IV, oxyCODONE, silver nitrate applicators, sodium chloride flush   Patient Profile   55 yo with CAD s/p CABG, ischemic cardiomyopathy/chronic systolic CHF, tophaceous gout, and CKD stage 3 was admitted for diuresis and consideration for LVAD placement. S/p HVAD on 6/28  Assessment/Plan:    1. Acute/chronic systolic CHF s/p HVAD: Ischemic cardiomyopathy.  St Jude ICD.  Echo (6/18) with EF 15%, mildly dilated RV with moderately decreased systolic function.  s/p HVAD placement 6/28 and had to return to OR to evacuate mediastinal hematoma.  He had been weaned off pressors/milrinone, but developed ileus w/ likely aspiration event 7/4, re-intubated, developed afib/RVR requiring norepinephrine. Extubated 7/6.  VAD speed turned down to 2540 with VT. VAD parameters currently look good.  Milrinone stopped 7/17 with recurrent VT.  He was started back on norepinephrine 8/9 due to soft BP after intubation and suspected septic shock, now off norepinephrine.  MAP 80s-90, getting hydralazine per tube. Volume status fine centrally with CVP 7. Main issue is massive 3rd spacing but low intravascular volume.  Legs now wrapped and weight stable. CVP low. No diuretics.  - Co-ox 74%. Can stop checking - Maps continue to run high. Hydralazine increased. Will add back amlodipine 5. - Off ASA with GI Bleed  - Goal INR 2-2.5. INR 1.37. Discussed with Pharm D. Continue heparin drip + coumadin. Stop heparin drip once INR 2.   - VAD parameters ok. Except low trough.  Discussed with VAD coordinator. Likely due to HTN. Adjusting. 2. AKI on CKD stage 3:  - Back to baseline 1.4. Stable today  3. Symptomatic anemia due to acute UGI bleeding:  Received 3 units PRBCs 7/16 and 3 units PRBCs on 04/25/2017. 1 unit PRBCs 7/20. 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  Requiring 2 clips + epi + APC. 1 unit PRBCs 8/6.  He had 1 unit PRBCs 8/10 to provide volume.  1 unit PRBCs on 8/22.    - Got Feraheme 7/28.  - Continue Protonix 40 po bid. - Hgb 11.0 on co-ox today  4. Ventricular tachycardia: Status post ventricular tachycardia 2 on 7/13. Possible suction event. VAD speed turned down to 2700.  Recurrent VT 7/17. EP called to bedside. Overdrive pacing successful for about 10 minutes but VT recurred.  Milrinone turned off, remained in VT overnight but hemodynamically stable.  Speed decreased to 2600.  On 7/18, we were able to pace him out of VT.  Lidocaine added then stopped due to confusion/mental status changes.  He had VT 7/22 about 30 minutes after getting IV Lasix, had to be paced out again => may have been due to rapid fluid shift.  Pt paced out of VT 3 separate occasions 7/25. Got amio bolus x 2 and started back on IV infusion.  Transitioned to amiodarone 400 mg tid.  On 8/1 had recurrent VT and paced out again.  Recurrent VT 8/2 per EP sotalol started along with amiodarone, sotalol decreased to once daily with bradycardia.  - Continue mexiletine to 300 mg BID, sotalol 80 mg daily, amio 200 mg BID. - Remains quiescent. Continue to follow closely.  - Keep K > 4.0 Mg > 2.0 - Recheck labs in am 5. Paroxysmal Atrial fibrillation:  -Developed atrial fibrillation with RVR in setting of aspiration PNA on 7/4.  In NSR currently on triple AA therapy 6. Malnutrition:   -Continue tube feeds via Cor-track. Nutrition following. 7. Aspiration  PNA with acute hypoxemic respiratory failure on 7/4: He was extubated on 7/6. Tracheal aspirate with  Klebsiella and citrobacter. Both sensitive to Zosyn. Completed abx 7/13. Possible aspiration 8/8. Treated with meropenem. Stopped 8/24 - Off abx 8. Acute Respiratory Failure: Intubated 7/17 in setting of complex GI intervention. Extubated 7/18. Re-intubated 8/8.  Weak respiratory muscles. He has not wanted a tracheostomy.  Extubated on 8/22. .  - Remains stable off vent  9. CAD: s/p CABG 2012. No chest pain.  Off aspirin due to GI bleeding. No change.   10. Gout: Severe tophaceous gout.  - Continue uloric. Stop colchicine with diarrhea. Flare now resolved. Prednisone reduced to 10 daily on 8/25. D/w PharmD.  11. Delirium: Appreciate neuro input Possibly related to lidocaine.  Lidocaine stopped. CT of head negative. EEG ok. No further workup per neuro. Neuro  reconsulted 8/7 with AMS => ?related to fever/infection.  He has been more clear on vent.  CT head 05/27/17 and 8/10 with lacunar infarcts but do not appear acute.  Seizure activity versus myoclonus => Depakote started.  Switched to [place] with ?drug rash from Depakote.   - Currently resolved 12. UTI: on vancomycin and meropenum. Urine culture with no growth - Meropenem stopped 8/24 13. Unstageable Pressure Ulcer: Sacrum. Consult WOC - Needs to start santyl daily to enzymatically debride. Reposition R to L. WOC recommendations appreciated.     - Continue with hydrotherapy.  14. Bradycardia: With hypotension.  Currently back-up pacing set at 60 bpm.  Does not have atrial lead so pacing asynchronously.  Currently, on and off pacing at rate of 60.    15. Hypothyroidism: May be related to amiodarone, he is on Levoxyl. No change.   16. ID: See above - Meropenem stopped 8/24    18. Severe Deconditioning: Continue PT. Will eventually need SNF or LTAC 19. Hypernatremia: sodium 155-154. Continue free water boluses - 300 q6 hrs. - Not improving. Give D5w at 50cc x 1 liter   I reviewed the HVAD parameters from today, and compared the results to the  patient's prior recorded data.  No programming changes were made.  The HVAD is functioning within specified parameters.    Glori Bickers, MD 06/15/2017, 11:05 AM  VAD Team --- VAD ISSUES ONLY--- Pager [phone] (7am - 7am) Advanced Heart Failure Team  Pager [phone] (M-F; 7a - 4p)  Please contact [facility] for night-coverage after hours (4p -7a ) and weekends on [email or web address]

## 2017-06-15 NOTE — Progress Notes (Signed)
ANTICOAGULATION CONSULT NOTE - Follow Up Consult  Pharmacy Consult for Heparin  + Coumadin Indication: HVAD  No Known Allergies  Patient Measurements: Height: 6' (182.9 cm) Weight: 234 lb (106.1 kg) IBW/kg (Calculated) : 77.6  Vital Signs: Temp: 98.1 F (36.7 C) (08/26 0743) Temp Source: Oral (08/26 0743) BP: 117/83 (08/26 0700) Pulse Rate: 74 (08/26 0700)  Labs:  Recent Labs  06/13/17 0248 06/14/17 0253 06/15/17 0426  HGB 9.2* 10.9*  --   HCT 29.9* 35.4*  --   PLT 279 278  --   LABPROT 17.0* 16.9* 17.0*  INR 1.37 1.36 1.37  HEPARINUNFRC 0.40 0.39 0.48  CREATININE 1.41* 1.43*  --     Estimated Creatinine Clearance: 74.3 mL/min (A) (by C-G formula based on SCr of 1.43 mg/dL (H)).  Assessment: 56 y/o M continues on heparin s/p HVAD on 04/16/2017.  No overt bleeding noted, CBC stable   Heparin level 0.48 this am within goal range on heparin drip rate 1600 uts/hr.  Pharmacy asked to resume Coumadin 8/22.  Has been very sensitive to Coumadin earlier this admission.  Will dose conservatively. INR 1.37 barely moving after warfarin 2mg  x1  Goal of Therapy:  Heparin level 0.3-0.5 units/mL INR 2-2.5 Monitor platelets by anticoagulation protocol: Yes   Plan:  Continue heparin drip 1600 uts/hr Daily heparin level and CBC. Coumadin 3 mg x 1 tonight. Daily INR.  Leota Sauers Pharm.D. CPP, BCPS Clinical Pharmacist [phone] 06/15/2017 10:04 AM

## 2017-06-15 NOTE — Progress Notes (Signed)
IV Morphine gtt 250 CCs wasted in sink with Jessica Milford,RN.  Not needed s/p extubation and patient is currently awake, alert and oriented without pain or discomfort.  (See order for discontinuation).

## 2017-06-15 NOTE — Progress Notes (Signed)
.  EKG CRITICAL VALUE     12 lead EKG performed.  Critical value noted.  Fleet Contras, RN notified.   SCALES-PRICE,Besse Miron, CCT 06/15/2017 8:08 AM

## 2017-06-16 DIAGNOSIS — I5023 Acute on chronic systolic (congestive) heart failure: Secondary | ICD-10-CM

## 2017-06-16 DIAGNOSIS — Z95811 Presence of heart assist device: Secondary | ICD-10-CM

## 2017-06-16 LAB — BASIC METABOLIC PANEL
Anion gap: 6 (ref 5–15)
BUN: 54 mg/dL — AB (ref 6–20)
CHLORIDE: 123 mmol/L — AB (ref 101–111)
CO2: 25 mmol/L (ref 22–32)
CREATININE: 1.25 mg/dL — AB (ref 0.61–1.24)
Calcium: 8.4 mg/dL — ABNORMAL LOW (ref 8.9–10.3)
GFR calc non Af Amer: >60 mL/min (ref >60)
Glucose, Bld: 144 mg/dL — ABNORMAL HIGH (ref 65–99)
POTASSIUM: 3.6 mmol/L (ref 3.5–5.1)
SODIUM: 154 mmol/L — AB (ref 135–145)

## 2017-06-16 LAB — GLUCOSE, CAPILLARY
GLUCOSE-CAPILLARY: 134 mg/dL — AB (ref 65–99)
GLUCOSE-CAPILLARY: 140 mg/dL — AB (ref 65–99)
GLUCOSE-CAPILLARY: 140 mg/dL — AB (ref 65–99)
GLUCOSE-CAPILLARY: 141 mg/dL — AB (ref 65–99)
GLUCOSE-CAPILLARY: 153 mg/dL — AB (ref 65–99)
Glucose-Capillary: 169 mg/dL — ABNORMAL HIGH (ref 65–99)
Glucose-Capillary: 165 mg/dL — ABNORMAL HIGH (ref 65–99)
Glucose-Capillary: 133 mg/dL — ABNORMAL HIGH (ref 65–99)

## 2017-06-16 LAB — CBC
HEMATOCRIT: 31.9 % — AB (ref 39.0–52.0)
Hemoglobin: 9.9 g/dL — ABNORMAL LOW (ref 13.0–17.0)
MCH: 28.9 pg (ref 26.0–34.0)
MCHC: 31 g/dL (ref 30.0–36.0)
MCV: 93 fL (ref 78.0–100.0)
Platelets: 256 10*3/uL (ref 150–400)
RBC: 3.43 MIL/uL — AB (ref 4.22–5.81)
RDW: 20.4 % — ABNORMAL HIGH (ref 11.5–15.5)
WBC: 10.9 10*3/uL — AB (ref 4.0–10.5)

## 2017-06-16 LAB — LACTATE DEHYDROGENASE: LDH: 176 U/L (ref 98–192)

## 2017-06-16 LAB — PROTIME-INR
INR: 1.39
Prothrombin Time: 17.1 seconds — ABNORMAL HIGH (ref 11.4–15.2)

## 2017-06-16 LAB — HEPARIN LEVEL (UNFRACTIONATED): Heparin Unfractionated: 0.42 IU/mL (ref 0.30–0.70)

## 2017-06-16 MED ORDER — CLONIDINE HCL 0.1 MG PO TABS
0.1000 mg | ORAL_TABLET | Freq: Two times a day (BID) | ORAL | Status: DC
  Administered 2017-06-16 – 2017-06-17 (×3): 0.1 mg via ORAL
  Filled 2017-06-16 (×3): qty 1

## 2017-06-16 MED ORDER — AMLODIPINE BESYLATE 10 MG PO TABS
10.0000 mg | ORAL_TABLET | Freq: Every day | ORAL | Status: DC
  Administered 2017-06-16 – 2017-06-17 (×2): 10 mg via ORAL
  Filled 2017-06-16 (×2): qty 1

## 2017-06-16 MED ORDER — PREDNISONE 10 MG PO TABS
5.0000 mg | ORAL_TABLET | Freq: Every day | ORAL | Status: DC
  Administered 2017-06-16 – 2017-06-19 (×4): 5 mg via ORAL
  Filled 2017-06-16 (×5): qty 1

## 2017-06-16 MED ORDER — WARFARIN SODIUM 3 MG PO TABS
3.0000 mg | ORAL_TABLET | Freq: Once | ORAL | Status: AC
  Administered 2017-06-16: 3 mg via ORAL
  Filled 2017-06-16: qty 1

## 2017-06-16 MED ORDER — POTASSIUM CHLORIDE 20 MEQ PO PACK
20.0000 meq | PACK | Freq: Once | ORAL | Status: AC
  Administered 2017-06-16: 20 meq via ORAL
  Filled 2017-06-16: qty 1

## 2017-06-16 MED ORDER — AMIODARONE HCL 200 MG PO TABS
200.0000 mg | ORAL_TABLET | Freq: Every day | ORAL | Status: DC
  Administered 2017-06-16 – 2017-06-25 (×10): 200 mg
  Filled 2017-06-16 (×10): qty 1

## 2017-06-16 MED ORDER — FREE WATER
400.0000 mL | Freq: Four times a day (QID) | Status: DC
  Administered 2017-06-16 – 2017-06-17 (×4): 400 mL

## 2017-06-16 NOTE — Progress Notes (Signed)
LVAD Inpatient Coordinator Rounding Note:  Admitted 04/10/2017 due to A/C Heart failure.   [facility] LVAD implanted on 2017/05/10 by Dr. Laneta Simmers as DT VAD.  Transferred to 4E 05/17/17.  Transferred back to ICU 2H09 on 05/28/17 with possible aspiration pneumonia requiring re-intubation.   Vital signs: Temp: 97.4 HR: 75 Doppler: not done Auto BP: 119/84 (96) O2 Sat: 96% on RA Wt in lbs: Marland Kitchen.. 217>223>224>224>234>240>245>235>234>236>239>234>232  LVAD interrogation reveals:  Speed: 2540 Flow: 3.2 Power: 3.4 w Alarms: suction alarm now OFF  Peak:  6.4 Trough: 0.0 HCT:  35 Low flow alarm setting: 1.7 High watt alarm setting: 5.5  Suction: OFF  Lavare cycle: on  2. Event log downloaded and sent to [facility], reviewed by clinical rep, Bessie Sycip. "Low trough can be caused by hypertension".    Blood Products: 6/28> 5 PRBC's, 6 FFP 7/1> 1 PRBC 7/9> 1 PRBC 7/13>3 PRBC 7/14>3 PRBC 7/15> 1 PRBC 7/16>3PRBC 7/17> 3 PRBC 7/20> 1 PRBC 8/6> 1 PRBC 8/10 > 1 PRBC 8/22 > 1 PRBC  Gtts: Milrinone - stopped 7/4; restarted 7/8, stopped 7/16 Levo stopped 04/26/17 restarted 05/03/2017 - stopped 05/07/17 Amiodarone - started 04/23/17;  stopped 04/29/17, restarted 7/17 - transitioned to PO 05/13/17; VT on 05/15/17 - IV amio re-started - 30 mg/hr stopped 05/16/17 Lidocaine 05/07/17 - stopped 7/19 Heparin 1600 u/hr  TPN - started 04/24/17 for nutritional support, stopped 04/30/17  NG inserted with tube feeding started 05/27/17; placed on hold 05/28/17   Cortrak inserted 06/07/17 - TF started 06/08/17 - currently running at 35 ml/hr  GI: 04/23/17 developed ileus with respiratory compromise - NG with 3 L suctioned out 05/03/17 - melena due to acute GI bleed -- PCs given as needed 05/08/2017 - 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  Neuro: 05/27/17- Neurology consult for lethargy, mouth tremors, not vocal, not following commands> Head CT and EEG 05/28/17 -  Continual EEG monitoring  05/30/17 - started Depakote for seizure activity - stopped 06/03/17 for rash - will start Keppra   Arrhythmia: 04/24/17 - Afib with RVR - started amiodarone 05/02/17 - 2 sustained episodes of VT - cardioverted x 1; second broke with overdrive pacing 7/17- wide complex tachycardia- Amio bolus x3 and gtt at 60 mg/hr 05/07/17 - sustained VT - converted with overdrive pacing; Lidocaine started in addition to amiodarone 05/08/17 - Lidocaine stopped due to confusion (lido level 12.4) 7/22 -  Sustained VT with rate 130 after getting IV lasix. Pace terminated, back on IV amiodarone 05/14/17 - sustained VT - converted 3 separate occassions with overdrive pacing 05/19/17 - sustained VT-converted with overdrive pacing  8/1 - VT pace terminated 8/2 - VT started Sotalol 05/26/17- Afib rate in the 50's 05/27/17 - NSR   Respiratory: 04/18/17 - Extubated 04/23/17 - re-intubated due to respiratory failure secondary to suspected aspiration pneumonia 04/25/17-extubated 05/14/2017- intubated for EGD 05/16/2017- extubated 05/28/17 - re-intubated for suspected aspiration pneumonia 06/11/17 - one way extubation  Drive Line: Daily Dressing Kits with Aquacell AG silver strips per protocol - will advance to every other day dressing changes. Please page VAD Coordinator with site update.  Labs:  LDH trend: .Marland Kitchen..158>156>156>146>154>188>142>138>127>128>148>164  INR trend: .... 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 He had epinephrine,    APC,    and 2 clips.  -  7/19 - Lidocaine gtt stopped due to confusion (lido toxicity) - 8/7- Neurology consult- lethargy, mouth tremors, not vocal, not following commands> Head CT and EEG, blood and    urine    cultures sent, Ammonia-20 8/8 - re-intubated for possible aspiration pneumonia   Plan/Recommendations:  1. Currently every other day dressing changes, due to be changed 06/17/17 or more often if dressing has drainage.  2. Please page VAD pager for any equipment or patient needs.   Hessie Diener RN, VAD Coordinator 24/7 pager [phone]

## 2017-06-16 NOTE — Progress Notes (Signed)
Physical Therapy Wound Treatment Patient Details  Name: James [name] MRN: [id number] Date of Birth: [birth year]  Today's Date: 06/16/2017 Time: 1856-3149 Time Calculation (min): 44 min  Subjective  Subjective: Pt awakens to converse with therapist - states he is agreeable to hydrotherapy and then closes eyes again Patient and Family Stated Goals: healed up so I can do well with therapy  Pain Score:    Wound Assessment  Pressure Injury 05/14/17 Unstageable - Full thickness tissue loss in which the base of the ulcer is covered by slough (yellow, tan, gray, green or brown) and/or eschar (tan, brown or black) in the wound bed. WOC updated, wound evolution to unstageable  (Active)  Dressing Type Moist to dry;Gauze (Comment) 06/16/2017 10:00 AM  Dressing Changed;Clean;Dry;Intact 06/16/2017 10:00 AM  Dressing Change Frequency Daily 06/16/2017 10:00 AM  State of Healing Eschar 06/16/2017 10:00 AM  Site / Wound Assessment Pink;Yellow 06/16/2017 10:00 AM  % Wound base Red or Granulating 15% 06/16/2017 10:00 AM  % Wound base Yellow/Fibrinous Exudate 80% 06/16/2017 10:00 AM  % Wound base Black/Eschar 5% 06/16/2017 10:00 AM  % Wound base Other/Granulation Tissue (Comment) 0% 06/16/2017 10:00 AM  Peri-wound Assessment Intact;Erythema (blanchable) 06/16/2017 10:00 AM  Wound Length (cm) 6 cm 06/13/2017  1:00 PM  Wound Width (cm) 7 cm 06/13/2017  1:00 PM  Wound Depth (cm) 3 cm 06/13/2017  1:00 PM  Tunneling (cm) 0 05/14/2017  9:00 PM  Undermining (cm) 2 cm at 11*, 1 cm at 6 * 06/13/2017  1:00 PM  Margins Unattached edges (unapproximated) 06/16/2017 10:00 AM  Drainage Amount Moderate 06/16/2017 10:00 AM  Drainage Description Serosanguineous;Purulent;No odor 06/16/2017 10:00 AM  Treatment Debridement (Selective);Hydrotherapy (Pulse lavage);Packing (Saline gauze) 06/16/2017 10:00 AM  Santyl applied to wound bed prior to applying dressing.   Hydrotherapy Pulsed lavage therapy - wound location: sacral Pulsed Lavage with  Suction (psi): 12 psi Pulsed Lavage with Suction - Normal Saline Used: 1000 mL Pulsed Lavage Tip: Tip with splash shield Selective Debridement Selective Debridement - Location: sacral Selective Debridement - Tools Used: Forceps;Scissors Selective Debridement - Tissue Removed: Yellow necrotic tissue   Wound Assessment and Plan  Wound Therapy - Assess/Plan/Recommendations Wound Therapy - Clinical Statement: pt will benefit from hydro for the pulsed lavage that will help cleansed wound, decrease bacterial load and otherwise prep wound for selective debridement. Wound Therapy - Functional Problem List: Relative immobility, decreased strength, compromised nutrition and skin integrity.  See PT notes Factors Delaying/Impairing Wound Healing: Incontinence;Infection - systemic/local;Immobility;Multiple medical problems Hydrotherapy Plan: Debridement;Dressing change;Patient/family education;Pulsatile lavage with suction Wound Therapy - Frequency: 6X / week Wound Therapy - Current Recommendations: PT;Case manager/social work;WOC nurse Wound Therapy - Follow Up Recommendations: Home health RN;Skilled nursing facility Wound Plan: see above  Wound Therapy Goals- Improve the function of patient's integumentary system by progressing the wound(s) through the phases of wound healing (inflammation - proliferation - remodeling) by: Decrease Necrotic Tissue to: 25 % Decrease Necrotic Tissue - Progress: Progressing toward goal Increase Granulation Tissue to: 75% including any viable tissues Increase Granulation Tissue - Progress: Progressing toward goal Improve Drainage Characteristics: Min;Serous Improve Drainage Characteristics - Progress: Progressing toward goal Goals/treatment plan/discharge plan were made with and agreed upon by patient/family: Yes Time For Goal Achievement: 7 days Wound Therapy - Potential for Goals: Good  Goals will be updated until maximal potential achieved or discharge criteria  met.  Discharge criteria: when goals achieved, discharge from hospital, MD decision/surgical intervention, no progress towards goals, refusal/missing three consecutive treatments without notification or  medical reason.  GP     Thelma Comp 06/16/2017, 10:21 AM   Rolinda Roan, PT, DPT Acute Rehabilitation Services Pager: [phone]

## 2017-06-16 NOTE — Progress Notes (Signed)
ANTICOAGULATION CONSULT NOTE - Follow Up Consult  Pharmacy Consult for Heparin  + Coumadin Indication: HVAD  No Known Allergies  Patient Measurements: Height: 6' (182.9 cm) Weight: 232 lb (105.2 kg) IBW/kg (Calculated) : 77.6  Vital Signs: Temp: 97.4 F (36.3 C) (08/27 0749) Temp Source: Oral (08/27 0749) BP: 115/92 (08/27 1015) Pulse Rate: 60 (08/27 1015)  Labs:  Recent Labs  06/14/17 0253 06/15/17 0426 06/16/17 0328  HGB 10.9*  --  9.9*  HCT 35.4*  --  31.9*  PLT 278  --  256  LABPROT 16.9* 17.0* 17.1*  INR 1.36 1.37 1.39  HEPARINUNFRC 0.39 0.48 0.42  CREATININE 1.43*  --  1.25*    Estimated Creatinine Clearance: 84.7 mL/min (A) (by C-G formula based on SCr of 1.25 mg/dL (H)).   Medications: Heparin @ 1600 units/hr  Assessment: 56 y/o M continues on heparin s/p HVAD on 04/14/2017. Heparin level 0.42 this am within goal range on heparin drip rate 1600 uts/hr.  Pharmacy asked to resume coumadin 8/22.  Has been very sensitive to coumadin earlier this admission.  Will dose conservatively. INR remains low 1.39 and barely moving. Dose increased to 3mg  last night, will repeat.   No overt bleeding noted, CBC stable.  Goal of Therapy:  Heparin level 0.3-0.5 units/mL INR 2-2.5 Monitor platelets by anticoagulation protocol: Yes   Plan:  1) Continue heparin at 1600 units/hr 2) Repeat coumadin 3mg  tonight 3) Daily INR, CBC, heparin level  Louie Casa, PharmD, BCPS 06/16/2017 10:43 AM

## 2017-06-16 NOTE — Plan of Care (Signed)
Problem: Skin Integrity: Goal: Risk for impaired skin integrity will decrease Outcome: Not Progressing Ongoing mobility and incontinence issues affecting skin integrity despite utilizing all available interventions to decrease risk for skin breakdown (low air loss and rotation mattress, Q2hour turns, mobilizing with PT, TF with nutrition supplements, barrier cream, foam dressings, floating heels, et al.). Sacral ulcer receiving hydrotherapy and dressing changes at least once daily.  Problem: Activity: Goal: Capacity to carry out activities will improve Outcome: Progressing Progressing slowly, encouraging pt to do as many ADLs as able to promote muscle conditioning.

## 2017-06-16 NOTE — Progress Notes (Signed)
Patient ID: James [name], male   DOB: 08/29/1961, 56 y.o.   MRN: [id number]   Advanced Heart Failure VAD Team Note  Subjective:    Events: --HVAD placed 6/28.  Returned to the OR that evening with high chest tube output, evacuation of mediastinal hematoma.  --Extubated 6/29. Milrinone stopped on 7/4. --7/4, patient developed ileus with respiratory compromise. NGT placed with 3 L suctioned out.  CXR with suspicion for aspiration PNA.  Patient had to be intubated.  He went into atrial fibrillation with RVR.  He became hypotensive and was started on norepinephrine and phenylephrine.  Amiodarone gtt begun.    --Extubated again on 7/6.  -- 7/13 Had 2 sustained episodes of VT. Had to be cardioverted 1. Second episode broke with overdrive pacing with Dr. Caryl Comes. VAD speed turned down to 2700.  --Melena due to acuteGI bleed --> 2 units PRBCs 7/14, 2 units 7/15. 3 U PRBCs 7/16,  04/26/2017 3 UPRBCs.  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  He had epinephrine, APC, and 2 clips. Intubated prior to procedure and placed on norepinephrine. Speed dropped to 2660.  --7/18 Back in VT with overdrive pacing. Lidocaine was started in addition to amiodarone. Extubated. --7/19 lidocaine stopped with confusion. Neuro consulted.  EEG normal.  CT of head no acute findings. 7/20 confusion had resolved.  --1 unit PRBCs 7/20.  --7/22, developed sustained VT with rate around 130 shortly after getting IV Lasix.  We were able to pace him out and back to NSR.  He was put back on amiodarone gtt.  --More VT on 7/25, paced out.  Back on IV amiodarone gtt.  NSR.  -- 7/31 Ramp ECHO decrease speed 2600 rpm.  -- 8/1 VT- paced out -- 8/2 VT - started on sotalol -- 8/6 Hgb 7.5, 1 unit PRBCs -- 8/7 Hgb 9.5, confused.  Head CT with right superior cerebellar artery lacunar infarcts, not acute. -- 8/8 Possible aspiration. Reintubated. -- 8/10 possible seizure => Depakote.  CT head no  change.  1 unit PRBCs.  -- Multiple suction alarms on 8/17 night which persisted so suction alarm deactivated.  -- Extubated 8/23  Weak.  Has been up to chair over the weekend.  MAP running up to 100 despite increasing BP-active meds.   Remains on heparin gtt + warfarin.  INR 1.35  Getting hydrotherapy.   Weight down 2 lbs, legs remained wrapped, CVP 8.   Sodium remains 134.    HVAD INTERROGATION:  HVAD:  Flow 3.6 liters/min, speed 2540,  power 3.5 W,  Peak 6.2 Trough 1. Suction alarms off. Lavare On.     Objective:    Vital Signs:   Temp:  [97.6 F (36.4 C)-98.1 F (36.7 C)] 97.7 F (36.5 C) (08/27 0354) Pulse Rate:  [60-78] 75 (08/27 0700) Resp:  [25-30] 26 (08/27 0700) BP: (99-144)/(74-104) 119/84 (08/27 0700) SpO2:  [96 %-100 %] 98 % (08/27 0700) Weight:  [232 lb (105.2 kg)] 232 lb (105.2 kg) (08/27 0500) Last BM Date: 06/15/17 Mean arterial Pressure 80s-100 Intake/Output:   Intake/Output Summary (Last 24 hours) at 06/16/17 0737 Last data filed at 06/16/17 0700  Gross per 24 hour  Intake             3029 ml  Output             1656 ml  Net             1373 ml  Physical Exam    Physical Exam: CVP 8 General:  Lying in bed fatigued. NAD HEENT: normal Cortrak in place. + temporal wasting anicteric  Neck: supple. JVP 6-7 Carotids 2+ bilat; no bruits. No lymphadenopathy or thryomegaly appreciated. Cor: LVAD hum.  Lungs: Clear anteriorly.  Abdomen: soft, NT/ND  No hepatosplenomegaly. No bruits or masses. Good bowel sounds . Driveline site clean. Anchor in place.  Extremities: no cyanosis, clubbing, rash. Diffuse gouty abnormalities  LEs wrapped. Mild thigh edema. LUE edematous.   Neuro: alert & oriented x 3, cranial nerves grossly intact. moves all 4 extremities w/o difficulty. Affect pleasant  Telemetry   Personally reviewed NSR around 70. Personally reviewed   Labs   Basic Metabolic Panel:  Recent Labs Lab 06/11/17 0224 06/12/17 0713 06/13/17 0248  06/14/17 0253 06/16/17 0328  NA 156* 151* 155* 154* 154*  K 4.1 4.0 4.0 3.9 3.6  CL 124* 122* 121* 123* 123*  CO2 26 26 26 27 25   GLUCOSE 116* 136* 110* 129* 144*  BUN 63* 59* 60* 58* 54*  CREATININE 1.39* 1.48* 1.41* 1.43* 1.25*  CALCIUM 8.5* 8.0* 8.5* 8.4* 8.4*    Liver Function Tests: No results for input(s): AST, ALT, ALKPHOS, BILITOT, PROT, ALBUMIN in the last 168 hours. No results for input(s): LIPASE, AMYLASE in the last 168 hours. No results for input(s): AMMONIA in the last 168 hours.  CBC:  Recent Labs Lab 06/11/17 0224 06/12/17 0713 06/13/17 0248 06/14/17 0253 06/16/17 0328  WBC 13.5* 12.5* 13.1* 11.1* 10.9*  HGB 7.7* 8.9* 9.2* 10.9* 9.9*  HCT 25.0* 29.7* 29.9* 35.4* 31.9*  MCV 91.2 91.1 91.7 91.9 93.0  PLT 289 275 279 278 256    INR:  Recent Labs Lab 06/12/17 0713 06/13/17 0248 06/14/17 0253 06/15/17 0426 06/16/17 0328  INR 1.43 1.37 1.36 1.37 1.39    Other results:     Imaging   No results found.   Medications:     Scheduled Medications: . amiodarone  200 mg Per Tube Daily  . amLODipine  10 mg Oral Daily  . chlorhexidine gluconate (MEDLINE KIT)  15 mL Mouth Rinse BID  . Chlorhexidine Gluconate Cloth  6 each Topical Q0600  . cloNIDine  0.1 mg Oral BID  . collagenase   Topical Daily  . febuxostat  120 mg Oral Daily  . feeding supplement (NEPRO CARB STEADY)  1,000 mL Per Tube Q24H  . feeding supplement (PRO-STAT SUGAR FREE 64)  60 mL Per Tube BID  . free water  400 mL Per Tube Q6H  . hydrALAZINE  100 mg Oral Q8H  . insulin aspart  0-15 Units Subcutaneous Q4H  . levETIRAcetam  500 mg Per Tube BID  . levothyroxine  50 mcg Per NG tube QAC breakfast  . magnesium oxide  400 mg Per Tube Daily  . mouth rinse  15 mL Mouth Rinse Q4H  . mexiletine  300 mg Per Tube Q12H  . pantoprazole sodium  40 mg Per Tube BID  . potassium chloride  20 mEq Oral Once  . predniSONE  10 mg Oral Q breakfast  . sodium chloride flush  10-40 mL Intracatheter  Q12H  . sotalol  80 mg Per Tube Daily  . Warfarin - Pharmacist Dosing Inpatient   Does not apply q1800    Infusions: . sodium chloride Stopped (06/15/17 1130)  . heparin 1,600 Units/hr (06/16/17 0700)  . norepinephrine (LEVOPHED) Adult infusion Stopped (06/04/17 2250)    PRN Medications: acetaminophen (TYLENOL) oral liquid 160 mg/5 mL,  albuterol, docusate, fentaNYL (SUBLIMAZE) injection, Gerhardt's butt cream, hydrALAZINE, midazolam, morphine injection, morphine, neomycin-bacitracin-polymyxin, ondansetron (ZOFRAN) IV, oxyCODONE, silver nitrate applicators, sodium chloride flush   Patient Profile   56 yo with CAD s/p CABG, ischemic cardiomyopathy/chronic systolic CHF, tophaceous gout, and CKD stage 3 was admitted for diuresis and consideration for LVAD placement. S/p HVAD on 6/28  Assessment/Plan:    1. Acute/chronic systolic CHF s/p HVAD: Ischemic cardiomyopathy.  [employer] ICD.  Echo (6/18) with EF 15%, mildly dilated RV with moderately decreased systolic function.  s/p HVAD placement 6/28 and had to return to OR to evacuate mediastinal hematoma.  He had been weaned off pressors/milrinone, but developed ileus w/ likely aspiration event 7/4, re-intubated, developed afib/RVR requiring norepinephrine. Extubated 7/6.  VAD speed turned down to 2540 with VT. VAD parameters currently look good.  Milrinone stopped 7/17 with recurrent VT.  He was started back on norepinephrine 8/9 due to soft BP after intubation and suspected septic shock, now off norepinephrine.  MAP still up to 100, getting hydralazine per tube as well as amlodipine.  Wide peak/trough may be related to HTN. Volume status fine centrally with CVP 8. Main issue is massive 3rd spacing but low intravascular volume.  Legs now wrapped and weight down 2 lbs. - Can hold off on Lasix today especially with Na still high.  - MAP continues to run quite high. Increase amlodipine to 10 mg daily, add low dose clonidine.  - Off ASA with GI Bleed  -  Goal INR 2-2.5. INR 1.35. Continue heparin drip + coumadin. Stop heparin drip once INR 2.   - VAD parameters ok. Except low trough, probably due to HTN.  2. AKI on CKD stage 3: Creatinine at baseline now.  3. Symptomatic anemia due to acute UGI bleeding:  Received 3 units PRBCs 7/16 and 3 units PRBCs on 05/03/2017. 1 unit PRBCs 7/20. 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  Requiring 2 clips + epi + APC. 1 unit PRBCs 8/6.  He had 1 unit PRBCs 8/10 to provide volume.  1 unit PRBCs on 8/22.   Hemoglobin now stable.  - Got Feraheme 7/28.  - Continue Protonix 40 po bid. 4. Ventricular tachycardia: Status post ventricular tachycardia 2 on 7/13. Possible suction event. VAD speed turned down to 2700.  Recurrent VT 7/17. EP called to bedside. Overdrive pacing successful for about 10 minutes but VT recurred.  Milrinone turned off, remained in VT overnight but hemodynamically stable.  Speed decreased to 2600.  On 7/18, we were able to pace him out of VT.  Lidocaine added then stopped due to confusion/mental status changes.  He had VT 7/22 about 30 minutes after getting IV Lasix, had to be paced out again => may have been due to rapid fluid shift.  Pt paced out of VT 3 separate occasions 7/25. Got amio bolus x 2 and started back on IV infusion.  Transitioned to amiodarone 400 mg tid.  On 8/1 had recurrent VT and paced out again.  Recurrent VT 8/2 per EP sotalol started along with amiodarone, sotalol decreased to once daily with bradycardia. Remains quiescent.  - Continue mexiletine to 300 mg BID, sotalol 80 mg daily.  - Decrease amiodarone to once daily.  - Keep K > 4.0 Mg > 2.0 5. Paroxysmal Atrial fibrillation:  Developed atrial fibrillation with RVR in setting of aspiration PNA on 7/4.  In NSR currently on triple AA therapy 6. Malnutrition:  Continue tube feeds via Cor-track.  Nutrition following. 7. Aspiration PNA with acute hypoxemic respiratory failure on 7/4: He was  extubated on 7/6. Tracheal aspirate with Klebsiella and citrobacter. Both sensitive to Zosyn. Completed abx 7/13. Possible aspiration 8/8. Treated with meropenem. Stopped 8/24 - Off abx 8. Acute Respiratory Failure: Intubated 7/17 in setting of complex GI intervention. Extubated 7/18. Re-intubated 8/8.  Weak respiratory muscles. He has not wanted a tracheostomy.  Extubated on 8/22.   - Remains stable off vent  9. CAD: s/p CABG 2012. No chest pain.  Off aspirin due to GI bleeding. No change.   10. Gout: Severe tophaceous gout.  - Continue uloric. Stopped colchicine with diarrhea. Flare now resolved. Prednisone reduced to 10 daily on 8/25, decrease to 5 mg daily today and stop in day or 2.   11. Delirium: Appreciate neuro input Possibly related to lidocaine.  Lidocaine stopped. CT of head negative. EEG ok. No further workup per neuro. Neuro  reconsulted 8/7 with AMS => ?related to fever/infection.  He has been more clear on vent.  CT head 05/27/17 and 8/10 with lacunar infarcts but do not appear acute.  Seizure activity versus myoclonus => Depakote started.  Switched to Gideon with ?drug rash from Depakote.   - Currently resolved 12. UTI: on vancomycin and meropenum. Urine culture with no growth - Meropenem stopped 8/24 13. Unstageable Pressure Ulcer: Sacrum. Consult WOC - Needs to start santyl daily to enzymatically debride. Reposition R to L. WOC recommendations appreciated.     - Continue with hydrotherapy.  14. Bradycardia: With hypotension.  Currently back-up pacing set at 60 bpm.  Does not have atrial lead so pacing asynchronously.  Currently, on and off pacing at rate of 60.    15. Hypothyroidism: May be related to amiodarone, he is on Levoxyl. No change.   16. ID: See above - Meropenem stopped 8/24    18. Severe Deconditioning: Continue PT. Will eventually need SNF or LTAC 19. Hypernatremia: sodium 154. Continue free water boluses, increase to 400 q6 hrs.  I reviewed the HVAD parameters from  today, and compared the results to the patient's prior recorded data.  No programming changes were made.  The HVAD is functioning within specified parameters.    Loralie Champagne, MD 06/16/2017, 7:37 AM  VAD Team --- VAD ISSUES ONLY--- Pager [phone] (7am - 7am) Advanced Heart Failure Team  Pager [phone] (M-F; 7a - 4p)  Please contact [facility] for night-coverage after hours (4p -7a ) and weekends on [email or web address]

## 2017-06-16 NOTE — Progress Notes (Signed)
CSW visited at bedside although patient was sleeping. No family at bedside at the time of visit.  Patient was sitting up in chair and was assisted by sky lift to chair. Patient seen by PT and appears to have recommended LTACH for continued care. CSW will discuss process further with VAD Coordinators and MD at [facility] this afternoon. CSW will continue to follow for supportive needs and resources as identified. Lasandra Beech, LCSW, CCSW-MCS [phone]

## 2017-06-16 NOTE — Progress Notes (Signed)
Patient ID: James [name], male   DOB: [birth year], 56 y.o.   MRN: [id number] HVAD Rounding Note  Subjective:    Remains weak post-extubation but stable. Has been up to chair over the weekend. VAD parameters stable, no arrhythmias  Tolerating tube feeds at goal   CVP 8 Weight 232 + 1300 cc yesterday  LVAD INTERROGATION:  HVAD:  Flow 3.1 liters/min, speed 2540, power 3, peak 6, trough 1  Objective:    Vital Signs:   Temp:  [97.4 F (36.3 C)-98.1 F (36.7 C)] 97.4 F (36.3 C) (08/27 0749) Pulse Rate:  [60-78] 69 (08/27 1100) Resp:  [19-30] 27 (08/27 1100) BP: (99-144)/(74-112) 120/85 (08/27 1100) SpO2:  [96 %-100 %] 99 % (08/27 1100) Weight:  [105.2 kg (232 lb)] 105.2 kg (232 lb) (08/27 0500) Last BM Date: 06/16/17 Mean arterial Pressure 80-100  Intake/Output:   Intake/Output Summary (Last 24 hours) at 06/16/17 1156 Last data filed at 06/16/17 1100  Gross per 24 hour  Intake             2989 ml  Output             1701 ml  Net             1288 ml     Physical Exam: General:  Looks weak and chronically ill. Laying in bed with eyes closed but responds. Cor: distant heart sounds with LVAD hum present. Lungs: clear Chest incision well-healed Abdomen: soft, nontender, nondistended.  Good bowel sounds. Extremities: peripheral edema, legs wrapped. Neuro: lethargic but responds appropriately.  Telemetry: sinus 70's  Labs: Basic Metabolic Panel:  Recent Labs Lab 06/11/17 0224 06/12/17 0713 06/13/17 0248 06/14/17 0253 06/16/17 0328  NA 156* 151* 155* 154* 154*  K 4.1 4.0 4.0 3.9 3.6  CL 124* 122* 121* 123* 123*  CO2 26 26 26 27 25   GLUCOSE 116* 136* 110* 129* 144*  BUN 63* 59* 60* 58* 54*  CREATININE 1.39* 1.48* 1.41* 1.43* 1.25*  CALCIUM 8.5* 8.0* 8.5* 8.4* 8.4*    Liver Function Tests: No results for input(s): AST, ALT, ALKPHOS, BILITOT, PROT, ALBUMIN in the last 168 hours. No results for input(s): LIPASE, AMYLASE in the last 168 hours. No results for  input(s): AMMONIA in the last 168 hours.  CBC:  Recent Labs Lab 06/11/17 0224 06/12/17 0713 06/13/17 0248 06/14/17 0253 06/16/17 0328  WBC 13.5* 12.5* 13.1* 11.1* 10.9*  HGB 7.7* 8.9* 9.2* 10.9* 9.9*  HCT 25.0* 29.7* 29.9* 35.4* 31.9*  MCV 91.2 91.1 91.7 91.9 93.0  PLT 289 275 279 278 256    INR:  Recent Labs Lab 06/12/17 0713 06/13/17 0248 06/14/17 0253 06/15/17 0426 06/16/17 0328  INR 1.43 1.37 1.36 1.37 1.39   LDH 176 Heparin 0.42 INR 1.39  Other results:  EKG:   Imaging:  No results found.   Medications:     Scheduled Medications: . amiodarone  200 mg Per Tube Daily  . amLODipine  10 mg Oral Daily  . chlorhexidine gluconate (MEDLINE KIT)  15 mL Mouth Rinse BID  . Chlorhexidine Gluconate Cloth  6 each Topical Q0600  . cloNIDine  0.1 mg Oral BID  . collagenase   Topical Daily  . febuxostat  120 mg Oral Daily  . feeding supplement (NEPRO CARB STEADY)  1,000 mL Per Tube Q24H  . feeding supplement (PRO-STAT SUGAR FREE 64)  60 mL Per Tube BID  . free water  400 mL Per Tube Q6H  . hydrALAZINE  100 mg  Oral Q8H  . insulin aspart  0-15 Units Subcutaneous Q4H  . levETIRAcetam  500 mg Per Tube BID  . levothyroxine  50 mcg Per NG tube QAC breakfast  . magnesium oxide  400 mg Per Tube Daily  . mouth rinse  15 mL Mouth Rinse Q4H  . mexiletine  300 mg Per Tube Q12H  . pantoprazole sodium  40 mg Per Tube BID  . predniSONE  5 mg Oral Q breakfast  . sodium chloride flush  10-40 mL Intracatheter Q12H  . sotalol  80 mg Per Tube Daily  . warfarin  3 mg Oral ONCE-1800  . Warfarin - Pharmacist Dosing Inpatient   Does not apply q1800     Infusions: . sodium chloride Stopped (06/15/17 1130)  . heparin 1,600 Units/hr (06/16/17 0700)  . norepinephrine (LEVOPHED) Adult infusion Stopped (06/04/17 2250)     PRN Medications:  acetaminophen (TYLENOL) oral liquid 160 mg/5 mL, albuterol, docusate, fentaNYL (SUBLIMAZE) injection, Gerhardt's butt cream, hydrALAZINE,  midazolam, morphine injection, morphine, neomycin-bacitracin-polymyxin, ondansetron (ZOFRAN) IV, oxyCODONE, silver nitrate applicators, sodium chloride flush  1. POD 60s/p HVAD for ischemic cardiomyopathy with acute on chronic systolic heart failure, EF 15% with moderate RV dysfunction. Returned to OR for bleeding from raw mediastinal tissues. His VAD parameters have been stable. He has remained hypertensive and meds being adjusted by the heart failure team.  2. Acute mental status changes of unclear etiology. This could have been related to infection, medications, hypothyroidism, possibly stroke not seen on CT. CT head from 8/7 showed lacunar infarcts that did not appear acute. Seen by neurology. EEG did not show seizure activity. Treated infection and mental status improved.  3. Acute respiratory failure: reintubated for airway protection. He has been doing fairly well since extubation last Wednesday. Continue IS, OOB to chair.  4. Acute on chronic kidney failure. Creat down to 1.25.Urine output ok.   5. GI bleeding and anemia: does not appear to be bleeding at this time.   6. VT: None since Sotalol started in addition to mexiletine and amiodarone.   7. Fever and Leukocytosis. Nothing cultured. Afebrile now and WBC count down to 10.On Vanc and meropenum.  8. Sacral pressure sore: Continue present treatment with hydrotherapy.  9. Nutrition: tolerating tube feeds at goal.   10. Hypernatremia:receiving free water boluses.  11. Anticoagulation: on heparin 1600 units per hour with INR 1.39. Continue Coumadin per tube.  I reviewed the LVAD parameters from today, and compared the results to the patient's prior recorded data. No programming changes were made. The LVAD is functioning within specified parameters. LVAD interrogation was negative for any significant power changes, alarms or PI events/speed drops. LVAD equipment check completed andis in good working order.  Back-up equipment present.     Length of Stay: [address]  Fernande Boyden [facility] 06/16/2017, 11:56 AM

## 2017-06-16 NOTE — Progress Notes (Signed)
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 Returned to OR later that day for evacuation of mediastinal hematoma. On 7/4 developed ileus with likely aspiration and intubated 7/4-7/6.   Pt developed Heme + stools with progressive anemia as well as  James [name] 05/02/17.  He underwent EGD with showed suspected AVM 05/05/2017. He was intubated for procedure and extubated 05/08/17.   He developed confusion 8/7 - head CT showed small bil. subdural hygromas or hematomas, not significantly changed since 05/08/17; and Rt superior cerebellar lacunar infarct which were new since 7/19, but have a chronic appearance. He developed worsened respirtory status 8/8 requiring intubation; developed gout flare up; and was extubated (one way as pt does not desire trach) on 8/23.   PMH - gout, DMII, HTN, CKD, CAD S/P CABGx6 2012, chronic systolic heart failure and [facility] ICD.    PT Comments    Pt admitted with above diagnosis. Pt currently with functional limitations due to balance and endurance deficits. Pt lethargic therefore determined it best to use Maxi sky lift with nursing to get pt OOB.  Then pt performed exercises once he was in chair.  Pt participated well overall with exercises.  Feel that LTACH may be a good option for pt at this point given his long stay and nursing care needs as well as continued therapy.   Pt will benefit from skilled PT to increase their independence and safety with mobility to allow discharge to the venue listed below.     Follow Up Recommendations  Supervision/Assistance - 24 hour;LTACH     Equipment Recommendations  Other (comment) (TBD next venue)    Recommendations for Other Services       Precautions / Restrictions Precautions Precautions: Fall Precaution Comments: Heartware  HVAD Restrictions Weight Bearing Restrictions: No    Mobility  Bed Mobility Overal bed mobility: Needs Assistance Bed Mobility: Rolling Rolling: Max assist;+2 for physical assistance         General bed mobility comments: Pt rolled Lt and Rt to place maxi sky lift pad  Transfers Overall transfer level: Needs assistance               General transfer comment: Used MAxisky lift to get pt to chair with nursing assisting. Once in chair, performed exercises with pt.   Ambulation/Gait                 Stairs            Wheelchair Mobility    Modified Rankin (Stroke Patients Only)       Balance Overall balance assessment: Needs assistance Sitting-balance support: Feet supported;Bilateral upper extremity supported Sitting balance-Leahy Scale: Poor Sitting balance - Comments: Pt could not hold self into sitting while sitting in chair.  Postural control: Left lateral lean                                  Cognition Arousal/Alertness: Lethargic Behavior During Therapy: Flat affect Overall Cognitive Status: Impaired/Different from baseline Area of Impairment: Attention;Memory;Following commands;Problem solving                   Current Attention Level: Sustained Memory: Decreased recall of precautions;Decreased short-term memory Following Commands: Follows one step commands consistently;Follows one step commands inconsistently  Safety/Judgement: Decreased awareness of safety Awareness: Intellectual Problem Solving: Slow processing;Decreased initiation;Difficulty sequencing;Requires verbal cues;Requires tactile cues General Comments: Pt slow to respond.       Exercises General Exercises - Upper Extremity Shoulder Flexion: AAROM;Right;Left;Seated;20 reps Elbow Flexion: AROM;10 reps;Both;Seated Wrist Flexion: AROM;5 reps;Supine;Both Wrist Extension: AROM;Supine;Both;10 reps Digit Composite Flexion: AROM;5 reps;Supine;Both Composite  Extension: AROM;5 reps;Supine;Both General Exercises - Lower Extremity Ankle Circles/Pumps: AROM;10 reps;Both;Supine (resisted movement) Quad Sets: AROM;Both;Supine;10 reps Other Exercises Other Exercises: Exercises once in chair    General Comments        Pertinent Vitals/Pain Pain Assessment: Faces Faces Pain Scale: Hurts little more Pain Location: Lt UE and Lt LE  Pain Descriptors / Indicators: Sore;Grimacing;Guarding Pain Intervention(s): Limited activity within patient's tolerance;Monitored during session;Repositioned  VSS  Home Living                      Prior Function            PT Goals (current goals can now be found in the care plan section) Progress towards PT goals: Progressing toward goals    Frequency    Min 3X/week      PT Plan Discharge plan needs to be updated    Co-evaluation              AM-PAC PT "6 Clicks" Daily Activity  Outcome Measure  Difficulty turning over in bed (including adjusting bedclothes, sheets and blankets)?: Unable Difficulty moving from lying on back to sitting on the side of the bed? : Unable Difficulty sitting down on and standing up from a chair with arms (e.g., wheelchair, bedside commode, etc,.)?: Unable Help needed moving to and from a bed to chair (including a wheelchair)?: Total Help needed walking in hospital room?: Total Help needed climbing 3-5 steps with a railing? : Total 6 Click Score: 6    End of Session Equipment Utilized During Treatment: Oxygen Activity Tolerance: Patient tolerated treatment well Patient left: in chair;with call bell/phone within reach Nurse Communication: Mobility status;Need for lift equipment PT Visit Diagnosis: Muscle weakness (generalized) (M62.81);Difficulty in walking, not elsewhere classified (R26.2);Other abnormalities of gait and mobility (R26.89);Pain Pain - Right/Left: Left Pain - part of body: Knee;Leg;Shoulder;Arm;Hand;Ankle and joints of foot;Hip      Time: 1000-1023 PT Time Calculation (min) (ACUTE ONLY): 23 min  Charges:  $Therapeutic Exercise: 8-22 mins $Therapeutic Activity: 8-22 mins                    G Codes:       James [name],PT Acute Rehabilitation [phone] (pager)    James [name] 06/16/2017, 10:46 AM

## 2017-06-16 NOTE — Consult Note (Signed)
WOC Nurse wound follow up Wound type:Unstageable pressure injury to sacrum.  Started hydrotherapy this AM.  Wound remains 85%  Slough to wound bed.   Denuded skin around rectum from flexiseal.  Flexiseal has been removed and Boudreaux butt paste implemented.   Measurement: 5 cm x 4 cm x 0.5 cm  Wound bed: pale pink nongranulating Drainage (amount, consistency, odor) moderate serosanguinous Periwound:intact Dressing procedure/placement/frequency:Continue Santyl dressings daily.  Hydrotherapy as ordered.  Boudreaux paste to rectal/anal opening.  WOC team will remain available as needed.  Maple Hudson RN BSN CWON Pager [phone]

## 2017-06-17 ENCOUNTER — Telehealth: Payer: Self-pay | Admitting: Licensed Clinical Social Worker

## 2017-06-17 DIAGNOSIS — R531 Weakness: Secondary | ICD-10-CM

## 2017-06-17 LAB — GLUCOSE, CAPILLARY
GLUCOSE-CAPILLARY: 146 mg/dL — AB (ref 65–99)
GLUCOSE-CAPILLARY: 158 mg/dL — AB (ref 65–99)
GLUCOSE-CAPILLARY: 118 mg/dL — AB (ref 65–99)
Glucose-Capillary: 137 mg/dL — ABNORMAL HIGH (ref 65–99)
Glucose-Capillary: 153 mg/dL — ABNORMAL HIGH (ref 65–99)
Glucose-Capillary: 164 mg/dL — ABNORMAL HIGH (ref 65–99)

## 2017-06-17 LAB — CBC
HCT: 30.8 % — ABNORMAL LOW (ref 39.0–52.0)
Hemoglobin: 9.4 g/dL — ABNORMAL LOW (ref 13.0–17.0)
MCH: 28.4 pg (ref 26.0–34.0)
MCHC: 30.5 g/dL (ref 30.0–36.0)
MCV: 93.1 fL (ref 78.0–100.0)
PLATELETS: 218 10*3/uL (ref 150–400)
RBC: 3.31 MIL/uL — ABNORMAL LOW (ref 4.22–5.81)
RDW: 20.4 % — ABNORMAL HIGH (ref 11.5–15.5)
WBC: 10 10*3/uL (ref 4.0–10.5)

## 2017-06-17 LAB — BASIC METABOLIC PANEL
Anion gap: 7 (ref 5–15)
BUN: 52 mg/dL — AB (ref 6–20)
CALCIUM: 8.4 mg/dL — AB (ref 8.9–10.3)
CHLORIDE: 123 mmol/L — AB (ref 101–111)
CO2: 25 mmol/L (ref 22–32)
CREATININE: 1.23 mg/dL (ref 0.61–1.24)
GFR calc Af Amer: >60 mL/min (ref >60)
GFR calc non Af Amer: >60 mL/min (ref >60)
GLUCOSE: 149 mg/dL — AB (ref 65–99)
Potassium: 3.4 mmol/L — ABNORMAL LOW (ref 3.5–5.1)
Sodium: 155 mmol/L — ABNORMAL HIGH (ref 135–145)

## 2017-06-17 LAB — LACTATE DEHYDROGENASE: LDH: 175 U/L (ref 98–192)

## 2017-06-17 LAB — HEPARIN LEVEL (UNFRACTIONATED): Heparin Unfractionated: 0.33 IU/mL (ref 0.30–0.70)

## 2017-06-17 LAB — PROTIME-INR
INR: 1.46
PROTHROMBIN TIME: 17.9 s — AB (ref 11.4–15.2)

## 2017-06-17 MED ORDER — WARFARIN SODIUM 3 MG PO TABS
3.0000 mg | ORAL_TABLET | Freq: Once | ORAL | Status: AC
  Administered 2017-06-17: 3 mg via ORAL
  Filled 2017-06-17: qty 1

## 2017-06-17 MED ORDER — POTASSIUM CHLORIDE 20 MEQ/15ML (10%) PO SOLN
40.0000 meq | Freq: Once | ORAL | Status: AC
  Administered 2017-06-17: 40 meq
  Filled 2017-06-17: qty 30

## 2017-06-17 MED ORDER — CLONIDINE HCL 0.2 MG PO TABS: 0.2000 mg | ORAL_TABLET | Freq: Two times a day (BID) | ORAL | Status: DC

## 2017-06-17 MED ORDER — PNEUMOCOCCAL VAC POLYVALENT 25 MCG/0.5ML IJ INJ: 0.5000 mL | INJECTION | INTRAMUSCULAR | Status: DC | PRN

## 2017-06-17 MED ORDER — FREE WATER
400.0000 mL | Status: DC
  Administered 2017-06-17 – 2017-06-25 (×49): 400 mL

## 2017-06-17 MED ORDER — SPIRONOLACTONE 25 MG PO TABS
12.5000 mg | ORAL_TABLET | Freq: Every day | ORAL | Status: DC
  Administered 2017-06-17: 12.5 mg via ORAL
  Filled 2017-06-17: qty 1

## 2017-06-17 MED ORDER — AMLODIPINE BESYLATE 10 MG PO TABS
10.0000 mg | ORAL_TABLET | Freq: Two times a day (BID) | ORAL | Status: DC
  Administered 2017-06-17 – 2017-06-24 (×15): 10 mg via ORAL
  Filled 2017-06-17 (×14): qty 1

## 2017-06-17 NOTE — Progress Notes (Signed)
OT Cancellation Note  Patient Details Name: AUBREY POWELSON MRN: [id number] DOB: 1961-05-14   Cancelled Treatment:    Reason Eval/Treat Not Completed: Fatigue/lethargy limiting ability to participate.  Pt too fatigued and tells OT "I just can't today".  He reports he didn't sleep last night.  Will check back.  Lue Sykora [place], OTR/L [phone]   Jeani Hawking M 06/17/2017, 11:58 AM

## 2017-06-17 NOTE — Progress Notes (Signed)
Inpatient Rehabilitation  Note that PT and OT's most recent recommendations for post acute therapy is IP Rehab.  I have reached out to the acute medical team for their recommendations to ensure that patient would be stable enough to participate in our program as well as the anticipated timing of medical readiness.  Plan to follow up with team when I know.  Please call with questions.   Charlane Ferretti., CCC/SLP Admission Coordinator  [facility] Inpatient Rehabilitation  Cell [phone]

## 2017-06-17 NOTE — Progress Notes (Signed)
ANTICOAGULATION CONSULT NOTE - Follow Up Consult  Pharmacy Consult for Heparin  + Coumadin Indication: HVAD  No Known Allergies  Patient Measurements: Height: 6' (182.9 cm) Weight: 232 lb (105.2 kg) IBW/kg (Calculated) : 77.6  Vital Signs: Temp: 98.5 F (36.9 C) (08/28 0300) Temp Source: Oral (08/28 0300) BP: 133/82 (08/28 0700) Pulse Rate: 78 (08/28 0700)  Labs:  Recent Labs  06/15/17 0426 06/16/17 0328 06/17/17 0232  HGB  --  9.9* 9.4*  HCT  --  31.9* 30.8*  PLT  --  256 218  LABPROT 17.0* 17.1* 17.9*  INR 1.37 1.39 1.46  HEPARINUNFRC 0.48 0.42 0.33  CREATININE  --  1.25* 1.23    Estimated Creatinine Clearance: 86 mL/min (by C-G formula based on SCr of 1.23 mg/dL).   Medications: Heparin @ 1600 units/hr  Assessment: 56 y/o M continues on heparin s/p HVAD on 03/23/2017. Heparin level 0.33 this am within goal range on heparin drip rate 1600 units/hr.  Pharmacy asked to resume coumadin 8/22.  Has been very sensitive to coumadin earlier this admission.  Will dose conservatively. INR finally starting to trend up to 1.46 with 3mg  doses.  No overt bleeding noted, CBC stable. LDH ok.   Goal of Therapy:  Heparin level 0.3-0.5 units/mL INR 2-2.5 Monitor platelets by anticoagulation protocol: Yes   Plan:  1) Continue heparin at 1600 units/hr 2) Repeat coumadin 3mg  tonight 3) Daily INR, CBC, heparin level  Louie Casa, PharmD, BCPS 06/17/2017 11:27 AM

## 2017-06-17 NOTE — Progress Notes (Signed)
Patient ID: James [name], male   DOB: 04/16/1961, 56 y.o.   MRN: [id number] HVAD Rounding Note  Subjective:    Only complaint is painful sacral area from pressure sore. Remains lethargic and weak.  MAP has been in 90-100 range and Clonidine did not change that. Question is Clonidine may be contributing to lethargy.  CVP stable at 7. Weight stable at 232. I =O   LVAD INTERROGATION:  HVAD:  Flow 3.1 liters/min, speed 2450, power 3, peak 6, trough 1.  Objective:    Vital Signs:   Temp:  [97.9 F (36.6 C)-98.7 F (37.1 C)] 98.5 F (36.9 C) (08/28 0300) Pulse Rate:  [60-82] 78 (08/28 0700) Resp:  [19-32] 21 (08/28 0700) BP: (112-142)/(82-112) 133/82 (08/28 0700) SpO2:  [85 %-100 %] 97 % (08/28 0700) Weight:  [105.2 kg (232 lb)] 105.2 kg (232 lb) (08/28 0500) Last BM Date: 06/17/17 Mean arterial Pressure 90-100  Intake/Output:   Intake/Output Summary (Last 24 hours) at 06/17/17 0837 Last data filed at 06/17/17 0800  Gross per 24 hour  Intake             1595 ml  Output             1510 ml  Net               85 ml     Physical Exam: General:  Lethargic,  No resp difficulty Cor: distant heart sounds with LVAD hum present. Lungs: clear Abdomen: soft, nontender, nondistended. Good bowel sounds. Extremities: no cyanosis, clubbing, rash, moderate edema in thighs with lower legs wrapped. Neuro: lethargic but responds appropriately.  moves all 4 extremities w/o difficulty.   Telemetry: sinus 70's  Labs: Basic Metabolic Panel:  Recent Labs Lab 06/12/17 0713 06/13/17 0248 06/14/17 0253 06/16/17 0328 06/17/17 0232  NA 151* 155* 154* 154* 155*  K 4.0 4.0 3.9 3.6 3.4*  CL 122* 121* 123* 123* 123*  CO2 _0 GLUCOSE 136* 110* 129* 144* 149*  BUN 59* 60* 58* 54* 52*  CREATININE 1.48* 1.41* 1.43* 1.25* 1.23  CALCIUM 8.0* 8.5* 8.4* 8.4* 8.4*    Liver Function Tests: No results for input(s): AST, ALT, ALKPHOS, BILITOT, PROT, ALBUMIN in the last 168 hours. No  results for input(s): LIPASE, AMYLASE in the last 168 hours. No results for input(s): AMMONIA in the last 168 hours.  CBC:  Recent Labs Lab 06/12/17 0713 06/13/17 0248 06/14/17 0253 06/16/17 0328 06/17/17 0232  WBC 12.5* 13.1* 11.1* 10.9* 10.0  HGB 8.9* 9.2* 10.9* 9.9* 9.4*  HCT 29.7* 29.9* 35.4* 31.9* 30.8*  MCV 91.1 91.7 91.9 93.0 93.1  PLT 275 279 278 256 218    INR:  Recent Labs Lab 06/13/17 0248 06/14/17 0253 06/15/17 0426 06/16/17 0328 06/17/17 0232  INR 1.37 1.36 1.37 1.39 1.46    Other results:  EKG:   Imaging:  No results found.   Medications:     Scheduled Medications: . amiodarone  200 mg Per Tube Daily  . amLODipine  10 mg Oral Daily  . chlorhexidine gluconate (MEDLINE KIT)  15 mL Mouth Rinse BID  . Chlorhexidine Gluconate Cloth  6 each Topical Q0600  . cloNIDine  0.1 mg Oral BID  . collagenase   Topical Daily  . febuxostat  120 mg Oral Daily  . feeding supplement (NEPRO CARB STEADY)  1,000 mL Per Tube Q24H  . feeding supplement (PRO-STAT SUGAR FREE 64)  60 mL Per Tube BID  . free water  400 mL Per Tube Q6H  . hydrALAZINE  100 mg Oral Q8H  . insulin aspart  0-15 Units Subcutaneous Q4H  . levETIRAcetam  500 mg Per Tube BID  . levothyroxine  50 mcg Per NG tube QAC breakfast  . magnesium oxide  400 mg Per Tube Daily  . mouth rinse  15 mL Mouth Rinse Q4H  . mexiletine  300 mg Per Tube Q12H  . pantoprazole sodium  40 mg Per Tube BID  . predniSONE  5 mg Oral Q breakfast  . sodium chloride flush  10-40 mL Intracatheter Q12H  . sotalol  80 mg Per Tube Daily  . Warfarin - Pharmacist Dosing Inpatient   Does not apply q1800     Infusions: . sodium chloride Stopped (06/15/17 1130)  . heparin 1,600 Units/hr (06/17/17 0816)     PRN Medications:  acetaminophen (TYLENOL) oral liquid 160 mg/5 mL, albuterol, docusate, fentaNYL (SUBLIMAZE) injection, Gerhardt's butt cream, hydrALAZINE, midazolam, morphine injection, morphine,  neomycin-bacitracin-polymyxin, ondansetron (ZOFRAN) IV, oxyCODONE, pneumococcal 23 valent vaccine, silver nitrate applicators, sodium chloride flush   1. POD 61s/p HVAD for ischemic cardiomyopathy with acute on chronic systolic heart failure, EF 15% with moderate RV dysfunction. Returned to OR for bleeding from raw mediastinal tissues. His VAD parameters have been stable. He has remained hypertensive and meds being adjusted by the heart failure team. May be best to get him off clonidine and try something else for HTN.  2. Acute mental status changes of unclear etiology. This could have been related to infection, medications, hypothyroidism, possibly stroke not seen on CT. CT head from 8/7 showed lacunar infarcts that did not appear acute. Seen by neurology. EEG didnot show seizure activity. Treated infection and mental status improved. Lethargy now likely due to generalized weakness.   3. Acute respiratory failure: reintubated for airway protection. He has been doing fairly well since extubation last Wednesday. Continue IS, OOB to chair.  4. Acute on chronic kidney failure. Creat down to 1.23.Urine output ok. Hypernatremia and getting free water.  5. GI bleeding and anemia: does not appear to be bleeding at this time.   6. VT: None since Sotalol started in addition to mexiletine and amiodarone.   7. Fever and Leukocytosis. Nothing cultured. Afebrile now and WBC count down to 10.On Vanc and meropenum.  8. Sacral pressure sore: Continue present treatment with hydrotherapy.  9. Nutrition: tolerating tube feeds at goal.   10. Hypernatremia:receiving free water boluses.  11. Anticoagulation: on heparin 1600 units per hour with INR 1.39. Continue Coumadin per tube.  12. Will need to discuss longer term treatment plans.  I reviewed the LVAD parameters from today, and compared the results to the patient's prior recorded data. No programming changes were made. The LVAD  is functioning within specified parameters. LVAD interrogation was negative for any significant power changes, alarms or PI events/speed drops. LVAD equipment check completed andis in good working order. Back-up equipment present.      Length of Stay: [address]  Fernande Boyden [facility] 06/17/2017, 8:37 AM

## 2017-06-17 NOTE — Progress Notes (Signed)
Patient ID: PAYTON NEWFIELD, male   DOB: 01-22-1961, 56 y.o.   MRN: [id number]  This NP visited patient at the bedside as a follow up for emotional support.  Patient appears weak and tired however he tells me "I'm okay".  He verbalizes no concerns or worries at the current time.   His mother-in-law and father-in-law come to the bedside to stay with him for a few hours today.  His wife Elease Hashimoto who is a principal in a school in [place] is unable to be present as school started yesterday.    I spoke to her today and she plans to be here tomorrow.  She verbalizes a concern today regarding her husband's participation in therapies, i.e. Physical,  occupational and hydrotherapy for wound care.  She worries that her husband will refuse to participate in hopes that all involved his motivating and encouraging as possible.  Discussed with patient and his wife the importance of continued conversation with each other and their  medical providers regarding overall plan of care and treatment options,  ensuring decisions are within the context of the patients values and GOCs.  Questions and concerns addressed.. Palliative medicine team will continue to support holistically  Time in    1200       Time out 1230   Total time spent on the unit was 30 minutes  Greater than 50% of the time was spent in counseling and coordination of care  Lorinda Creed NP  Palliative Medicine Team Team Phone # [phone] Pager [phone]

## 2017-06-17 NOTE — Progress Notes (Signed)
Patient ID: James [name], male   DOB: 01/06/1961, 56 y.o.   MRN: [id number]   Advanced Heart Failure VAD Team Note  Subjective:    Events: --HVAD placed 6/28.  Returned to the OR that evening with high chest tube output, evacuation of mediastinal hematoma.  --Extubated 6/29. Milrinone stopped on 7/4. --7/4, patient developed ileus with respiratory compromise. NGT placed with 3 L suctioned out.  CXR with suspicion for aspiration PNA.  Patient had to be intubated.  He went into atrial fibrillation with RVR.  He became hypotensive and was started on norepinephrine and phenylephrine.  Amiodarone gtt begun.    --Extubated again on 7/6.  -- 7/13 Had 2 sustained episodes of VT. Had to be cardioverted 1. Second episode broke with overdrive pacing with Dr. Caryl Comes. VAD speed turned down to 2700.  --Melena due to acuteGI bleed --> 2 units PRBCs 7/14, 2 units 7/15. 3 U PRBCs 7/16,  04/28/2017 3 UPRBCs.  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  He had epinephrine, APC, and 2 clips. Intubated prior to procedure and placed on norepinephrine. Speed dropped to 2660.  --7/18 Back in VT with overdrive pacing. Lidocaine was started in addition to amiodarone. Extubated. --7/19 lidocaine stopped with confusion. Neuro consulted.  EEG normal.  CT of head no acute findings. 7/20 confusion had resolved.  --1 unit PRBCs 7/20.  --7/22, developed sustained VT with rate around 130 shortly after getting IV Lasix.  We were able to pace him out and back to NSR.  He was put back on amiodarone gtt.  --More VT on 7/25, paced out.  Back on IV amiodarone gtt.  NSR.  -- 7/31 Ramp ECHO decrease speed 2600 rpm.  -- 8/1 VT- paced out -- 8/2 VT - started on sotalol -- 8/6 Hgb 7.5, 1 unit PRBCs -- 8/7 Hgb 9.5, confused.  Head CT with right superior cerebellar artery lacunar infarcts, not acute. -- 8/8 Possible aspiration. Reintubated. -- 8/10 possible seizure => Depakote.  CT head no  change.  1 unit PRBCs.  -- Multiple suction alarms on 8/17 night which persisted so suction alarm deactivated.  -- Extubated 8/23  Resting. Remains weak. Has been up to chair. MAP remains in 90-100 range.  He may be more lethargic today since starting clonidine and it has not improved his BP.   Remains on heparin gtt + warfarin.  INR 1.46  Getting hydrotherapy.   Weight stable. CVP 7.   Sodium 155  HVAD INTERROGATION:  HVAD:  Flow 3.1 liters/min, speed 2450,  power 3.0 W,  Peak 6.8 Trough 0.1. Suction alarms off. Lavare on.   Objective:    Vital Signs:   Temp:  [97.9 F (36.6 C)-98.7 F (37.1 C)] 98.5 F (36.9 C) (08/28 0300) Pulse Rate:  [60-82] 78 (08/28 0700) Resp:  [19-32] 21 (08/28 0700) BP: (112-142)/(82-112) 133/82 (08/28 0700) SpO2:  [85 %-100 %] 97 % (08/28 0700) Weight:  [232 lb (105.2 kg)] 232 lb (105.2 kg) (08/28 0500) Last BM Date: 06/17/17 Mean arterial Pressure 80s-100 Intake/Output:   Intake/Output Summary (Last 24 hours) at 06/17/17 0807 Last data filed at 06/17/17 0800  Gross per 24 hour  Intake             1595 ml  Output             1510 ml  Net               85 ml  Physical Exam    Physical Exam: CVP 7  GENERAL: Fatigued and chronically ill appearing.  NAD.  HEENT: Normal. NECK: Supple, JVP 7-8 cm. Carotids OK.  CARDIAC:  Mechanical heart sounds with LVAD hum present.  LUNGS:  CTAB, normal effort.  ABDOMEN:  NT, ND, no HSM. No bruits or masses. +BS  LVAD exit site: Dressing dry and intact. No erythema or drainage. Stabilization device present and accurately applied. Driveline dressing changed daily per sterile technique. EXTREMITIES:  Warm and dry. No cyanosis or clubbing. Diffuse gouty abnormalities. LEs wrapped. 1-2+ edema into thighs.  NEUROLOGIC:  Alert & oriented x 3. Cranial nerves grossly intact. Moves all 4 extremities w/o difficulty. Affect flat but appropriate.   Telemetry   Personally reviewed, NSR 70s   Labs   Basic  Metabolic Panel:  Recent Labs Lab 06/12/17 0713 06/13/17 0248 06/14/17 0253 06/16/17 0328 06/17/17 0232  NA 151* 155* 154* 154* 155*  K 4.0 4.0 3.9 3.6 3.4*  CL 122* 121* 123* 123* 123*  CO2 _0 GLUCOSE 136* 110* 129* 144* 149*  BUN 59* 60* 58* 54* 52*  CREATININE 1.48* 1.41* 1.43* 1.25* 1.23  CALCIUM 8.0* 8.5* 8.4* 8.4* 8.4*    Liver Function Tests: No results for input(s): AST, ALT, ALKPHOS, BILITOT, PROT, ALBUMIN in the last 168 hours. No results for input(s): LIPASE, AMYLASE in the last 168 hours. No results for input(s): AMMONIA in the last 168 hours.  CBC:  Recent Labs Lab 06/12/17 0713 06/13/17 0248 06/14/17 0253 06/16/17 0328 06/17/17 0232  WBC 12.5* 13.1* 11.1* 10.9* 10.0  HGB 8.9* 9.2* 10.9* 9.9* 9.4*  HCT 29.7* 29.9* 35.4* 31.9* 30.8*  MCV 91.1 91.7 91.9 93.0 93.1  PLT 275 279 278 256 218    INR:  Recent Labs Lab 06/13/17 0248 06/14/17 0253 06/15/17 0426 06/16/17 0328 06/17/17 0232  INR 1.37 1.36 1.37 1.39 1.46   Other results:    Imaging   No results found.  Medications:    Scheduled Medications: . amiodarone  200 mg Per Tube Daily  . amLODipine  10 mg Oral Daily  . chlorhexidine gluconate (MEDLINE KIT)  15 mL Mouth Rinse BID  . Chlorhexidine Gluconate Cloth  6 each Topical Q0600  . cloNIDine  0.1 mg Oral BID  . collagenase   Topical Daily  . febuxostat  120 mg Oral Daily  . feeding supplement (NEPRO CARB STEADY)  1,000 mL Per Tube Q24H  . feeding supplement (PRO-STAT SUGAR FREE 64)  60 mL Per Tube BID  . free water  400 mL Per Tube Q6H  . hydrALAZINE  100 mg Oral Q8H  . insulin aspart  0-15 Units Subcutaneous Q4H  . levETIRAcetam  500 mg Per Tube BID  . levothyroxine  50 mcg Per NG tube QAC breakfast  . magnesium oxide  400 mg Per Tube Daily  . mouth rinse  15 mL Mouth Rinse Q4H  . mexiletine  300 mg Per Tube Q12H  . pantoprazole sodium  40 mg Per Tube BID  . predniSONE  5 mg Oral Q breakfast  . sodium chloride  flush  10-40 mL Intracatheter Q12H  . sotalol  80 mg Per Tube Daily  . Warfarin - Pharmacist Dosing Inpatient   Does not apply q1800    Infusions: . sodium chloride Stopped (06/15/17 1130)  . heparin 1,600 Units/hr (06/17/17 0800)  . norepinephrine (LEVOPHED) Adult infusion Stopped (06/04/17 2250)    PRN Medications: acetaminophen (TYLENOL) oral liquid 160 mg/5  mL, albuterol, docusate, fentaNYL (SUBLIMAZE) injection, Gerhardt's butt cream, hydrALAZINE, midazolam, morphine injection, morphine, neomycin-bacitracin-polymyxin, ondansetron (ZOFRAN) IV, oxyCODONE, pneumococcal 23 valent vaccine, silver nitrate applicators, sodium chloride flush   Patient Profile   56 yo with CAD s/p CABG, ischemic cardiomyopathy/chronic systolic CHF, tophaceous gout, and CKD stage 3 was admitted for diuresis and consideration for LVAD placement. S/p HVAD on 6/28  Assessment/Plan:    1. Acute/chronic systolic CHF s/p HVAD: Ischemic cardiomyopathy.  [employer] Jude ICD.  Echo (6/18) with EF 15%, mildly dilated RV with moderately decreased systolic function.  s/p HVAD placement 6/28 and had to return to OR to evacuate mediastinal hematoma.  He had been weaned off pressors/milrinone, but developed ileus w/ likely aspiration event 7/4, re-intubated, developed afib/RVR requiring norepinephrine. Extubated 7/6.  VAD speed turned down to 2540 with VT. VAD parameters currently look good.  Milrinone stopped 7/17 with recurrent VT.  He was started back on norepinephrine 8/9 due to soft BP after intubation and suspected septic shock, now off norepinephrine.  MAP still up to 100, getting hydralazine per tube as well as amlodipine.  Wide peak/trough may be related to HTN. Volume status fine centrally with CVP 8. Main issue is massive 3rd spacing but low intravascular volume.  Legs now wrapped and weight stable. - Can hold off on Lasix today especially with Na still high.  - MAP continues to run quite high despite clonidine, and he seems  more lethargic since starting clonidine.  Will stop this and increase amlodipine to 10 mg bid. With stable creatinine, will also add spironolactone 12.5 mg daily.  - Off ASA with GI Bleed  - Goal INR 2-2.5. INR 1.46. Continue heparin drip + coumadin. Stop heparin drip once INR 2.   - VAD parameters stable except low trough. Confirmed with VAD rep this is 2/2 HTN.  2. AKI on CKD stage 3:  - Creatinine at baseline.   3. Symptomatic anemia due to acute UGI bleeding:  Received 3 units PRBCs 7/16 and 3 units PRBCs on 04/22/2017. 1 unit PRBCs 7/20. 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  Requiring 2 clips + epi + APC. 1 unit PRBCs 8/6.  He had 1 unit PRBCs 8/10 to provide volume.  1 unit PRBCs on 8/22.   Hemoglobin gently trending down over past 3 days. Hgb 9.4 this am. No overt bleeding.   - Got Feraheme 7/28.  - Continue Protonix 40 po bid. 4. Ventricular tachycardia: Status post ventricular tachycardia 2 on 7/13. Possible suction event. VAD speed turned down to 2700.  Recurrent VT 7/17. EP called to bedside. Overdrive pacing successful for about 10 minutes but VT recurred.  Milrinone turned off, remained in VT overnight but hemodynamically stable.  Speed decreased to 2600.  On 7/18, we were able to pace him out of VT.  Lidocaine added then stopped due to confusion/mental status changes.  He had VT 7/22 about 30 minutes after getting IV Lasix, had to be paced out again => may have been due to rapid fluid shift.  Pt paced out of VT 3 separate occasions 7/25. Got amio bolus x 2 and started back on IV infusion.  Transitioned to amiodarone 400 mg tid.  On 8/1 had recurrent VT and paced out again.  Recurrent VT 8/2 per EP sotalol started along with amiodarone, sotalol decreased to once daily with bradycardia. Remains quiescent.  - Continue mexiletine to 300 mg BID, sotalol 80 mg daily.  - Continue amiodarone 200 mg  daily.  - Keep K > 4.0 Mg > 2.0 5. Paroxysmal Atrial  fibrillation:  Developed atrial fibrillation with RVR in setting of aspiration PNA on 7/4.  In NSR currently on triple AA therapy. No change.  6. Malnutrition:  Continue tube feeds via Cor-track. Nutrition following. No change.  7. Aspiration PNA with acute hypoxemic respiratory failure on 7/4: He was extubated on 7/6. Tracheal aspirate with Klebsiella and citrobacter. Both sensitive to Zosyn. Completed abx 7/13. Possible aspiration 8/8. Treated with meropenem. Stopped 8/24 - Off abx. No change.  8. Acute Respiratory Failure: Intubated 7/17 in setting of complex GI intervention. Extubated 7/18. Re-intubated 8/8.  Weak respiratory muscles. He has not wanted a tracheostomy.  Extubated on 8/22.   - Remains stable off vent.  9. CAD: s/p CABG 2012. No chest pain.  Off aspirin due to GI bleeding. No change.  10. Gout: Severe tophaceous gout.  - Continue uloric. Stopped colchicine with diarrhea. Flare now resolved. No change Prednisone reduced to 10 daily on 8/25, decrease to 5 mg daily today and stop in day or 2.   11. Delirium: Appreciate neuro input Possibly related to lidocaine.  Lidocaine stopped. CT of head negative. EEG ok. No further workup per neuro. Neuro  reconsulted 8/7 with AMS => ?related to fever/infection.  He has been more clear on vent.  CT head 05/27/17 and 8/10 with lacunar infarcts but do not appear acute.  Seizure activity versus myoclonus => Depakote started.  Switched to [place] with ?drug rash from Depakote.   - Currently resolved. 12. UTI: on vancomycin and meropenum. Urine culture with no growth - Meropenem stopped 8/24. No change.  13. Unstageable Pressure Ulcer: Sacrum. Consult WOC - Needs to start santyl daily to enzymatically debride. Reposition R to L.  - Continue with hydrotherapy. No change. Appreciate WOC care.  14. Bradycardia: With hypotension.  Currently back-up pacing set at 60 bpm.  Does not have atrial lead so pacing asynchronously.  - Currently, on and off pacing at  rate of 60.    15. Hypothyroidism: May be related to amiodarone, he is on Levoxyl. No change.    16. ID: See above - Meropenem stopped 8/24 . No change.  18. Severe Deconditioning: Continue PT. Will eventually need SNF or LTAC. Considering [facility].  19. Hypernatremia: sodium 155. Continue free water boluses 400 q6 hrs.  I reviewed the HVAD parameters from today, and compared the results to the patient's prior recorded data.  No programming changes were made.  The HVAD is functioning within specified parameters.    Shirley Friar, PA-C 06/17/2017, 8:07 AM  VAD Team --- VAD ISSUES ONLY--- Pager [phone] (7am - 7am) Advanced Heart Failure Team  Pager [phone] (M-F; 7a - 4p)  Please contact [facility] for night-coverage after hours (4p -7a ) and weekends on [email or web address]  Patient seen with PA, agree with the above note.   MAP still elevated.  Appears somewhat more lethargic on clonidine.  Will stop clonidine and increase amlodipine to 10 mg bid.  Will add spironolactone 12.5 mg daily now that creatinine is stable.   CVP 7, has marked 3rd-spacing but compression wraps are helping. No Lasix today.   Remains hypernatremic and NPO. Increase free water to 400 cc every 4 hrs.    HVAD parameters stable.   Large pressure ulcer is being treated with hydrotherapy.   Has been out of bed to chair.   Eventually, needs CIR (cannot go to [facility] with LVAD apparently).  CIR to  consult.   Loralie Champagne 06/17/2017 11:32 AM

## 2017-06-17 NOTE — Progress Notes (Signed)
LVAD Inpatient Coordinator Rounding Note:  Admitted 03/25/2017 due to A/C Heart failure.   [employer] LVAD implanted on 04/15/2017 by Dr. Laneta Simmers as DT VAD.  Transferred to 4E 05/17/17.  Transferred back to ICU 2H09 on 05/28/17 with possible aspiration pneumonia requiring re-intubation.   Vital signs: Temp: 98.5 HR: 81 Doppler: 130- personally performed Auto BP: 133/82 (94) O2 Sat: 96% on RA Wt in lbs: Marland Kitchen.. 217>223>224>224>234>240>245>235>234>236>239>234>232>232  LVAD interrogation reveals:  Speed: 2540 Flow: 3.5 Power: 3.5w Alarms: none Peak: 7 Trough: 0.0 HCT:  31 Low flow alarm setting: 1.7 High watt alarm setting: 5.5  Suction: OFF  Lavare cycle: on  Blood Products: 6/28> 5 PRBC's, 6 FFP 7/1> 1 PRBC 7/9> 1 PRBC 7/13>3 PRBC 7/14>3 PRBC 7/15> 1 PRBC 7/16>3PRBC 7/17> 3 PRBC 7/20> 1 PRBC 8/6> 1 PRBC 8/10 > 1 PRBC 8/22 > 1 PRBC  Gtts: Heparin 1600 u/hr  NG inserted with tube feeding started 05/27/17; placed on hold 05/28/17   Cortrak inserted 06/07/17 - TF started 06/08/17 - currently running at 35 ml/hr  GI: 04/23/17 developed ileus with respiratory compromise - NG with 3 L suctioned out 05/03/17 - melena due to acute GI bleed -- PCs given as needed 04/27/2017 - 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  Neuro: 05/27/17- Neurology consult for lethargy, mouth tremors, not vocal, not following commands> Head CT and EEG 05/28/17 - Continual EEG monitoring  05/30/17 - started Depakote for seizure activity - stopped 06/03/17 for rash - will start Keppra   Arrhythmia: 04/24/17 - Afib with RVR - started amiodarone 05/02/17 - 2 sustained episodes of VT - cardioverted x 1; second broke with overdrive pacing 7/17- wide complex tachycardia- Amio bolus x3 and gtt at 60 mg/hr 05/07/17 - sustained VT - converted with overdrive pacing; Lidocaine started in addition to amiodarone 05/08/17 - Lidocaine stopped due to confusion (lido  level 12.4) 7/22 -  Sustained VT with rate 130 after getting IV lasix. Pace terminated, back on IV amiodarone 05/14/17 - sustained VT - converted 3 separate occassions with overdrive pacing 05/19/17 - sustained VT-converted with overdrive pacing  8/1 - VT pace terminated 8/2 - VT started Sotalol 05/26/17- Afib rate in the 50's 05/27/17 - NSR   Respiratory: 04/18/17 - Extubated 04/23/17 - re-intubated due to respiratory failure secondary to suspected aspiration pneumonia 04/25/17-extubated 04/26/2017- intubated for EGD 05/19/2017- extubated 05/28/17 - re-intubated for suspected aspiration pneumonia 06/11/17 - one way extubation  Drive Line: Daily Dressing Kits with Aquacell AG silver strips per protocol - will advance to every other day dressing changes. Due today.  Labs:  LDH trend: .Marland Kitchen..158>156>156>146>154>188>142>138>127>128>148>164>175  INR trend: .... 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 He had epinephrine,    APC, and 2 clips.  - 7/19 - Lidocaine gtt stopped due to confusion (lido toxicity) - 8/7- Neurology consult- lethargy, mouth tremors, not vocal, not following commands>Head CT and EEG, blood and    urine cultures sent, Ammonia-20 -8/8 - re-intubated for possible aspiration pneumonia   Plan/Recommendations:  1. Currently every other day dressing changes 2. Please page VAD pager for any equipment or patient needs  Marcellus Scott RN, VAD Coordinator 24/7 pager  [phone]   

## 2017-06-17 NOTE — Progress Notes (Signed)
Chaplain continuing support for patient and family.  Chaplain met mother in waiting room and provided safe space for her to reflect on this experience.  Mother is expressing how good of a son Jamesyn has been saying "I have not had even a minutes trouble out of [place]."  She is happy with the progress he has made and is expressing her hopes for continued progress.  Chaplain greeted Claudy today who is expressing being tired.  Chaplain encouraged him to take things one day / one moment at a time.  Chaplain will continuing support and encouragement to this patient and family.  Ministry of presence, prayer and continued support provided.    06/17/17 1418  Clinical Encounter Type  Visited With Patient  Visit Type Follow-up;Social support;Spiritual support;Psychological support;Critical Care  Spiritual Encounters  Spiritual Needs Prayer  Stress Factors  Patient Stress Factors Exhausted

## 2017-06-17 NOTE — Progress Notes (Signed)
CSW met with patient's mother and in laws in the waiting area.  Family stated that patient was getting some therapy and they stepped out of the room. Family report that patient looked good today although remains very weak. CSW provided supportive intervention and will continue to follow for support and encouragement to patient/wife. Raquel Sarna, [place], [place]

## 2017-06-17 NOTE — Progress Notes (Signed)
Physical Therapy Wound Treatment Patient Details  Name: James [name] MRN: [id number] Date of Birth: 04-22-1961  Today's Date: 06/17/2017 Time: 6314-9702 Time Calculation (min): 47 min  Subjective  Subjective: Agreeable Patient and Family Stated Goals: healed up so I can do well with therapy  Pain Score:  pt never reacted to any potential painful stimuli  Wound Assessment  Pressure Injury 05/14/17 Unstageable - Full thickness tissue loss in which the base of the ulcer is covered by slough (yellow, tan, gray, green or brown) and/or eschar (tan, brown or black) in the wound bed. WOC updated, wound evolution to unstageable  (Active)  Dressing Type Moist to dry;Gauze (Comment) 06/17/2017 12:06 PM  Dressing Changed;Clean;Dry;Intact 06/17/2017 12:06 PM  Dressing Change Frequency Daily 06/17/2017 12:06 PM  State of Healing Eschar 06/17/2017 12:06 PM  Site / Wound Assessment Pink;Yellow 06/17/2017 12:06 PM  % Wound base Red or Granulating 20% 06/17/2017 12:06 PM  % Wound base Yellow/Fibrinous Exudate 75% 06/17/2017 12:06 PM  % Wound base Black/Eschar 5% 06/17/2017 12:06 PM  % Wound base Other/Granulation Tissue (Comment) 0% 06/17/2017 12:06 PM  Peri-wound Assessment Intact;Erythema (blanchable) 06/17/2017 12:06 PM  Wound Length (cm) 6 cm 06/16/2017  8:00 PM  Wound Width (cm) 7 cm 06/16/2017  8:00 PM  Wound Depth (cm) 9 cm 06/16/2017  8:00 PM  Tunneling (cm) 0 06/16/2017  8:00 PM  Undermining (cm) 2cm 9:00-2:00, 1cm 2:00-7:00, 3cm 7:00-9:00 06/16/2017  8:00 PM  Margins Unattached edges (unapproximated) 06/17/2017 12:06 PM  Drainage Amount Moderate 06/17/2017 12:06 PM  Drainage Description Serosanguineous;Purulent;No odor 06/17/2017 12:06 PM  Treatment Cleansed;Debridement (Selective);Hydrotherapy (Pulse lavage);Packing (Saline gauze) 06/17/2017 12:06 PM   Santyl applied to wound bed prior to applying dressing.    Hydrotherapy Pulsed lavage therapy - wound location: sacral Pulsed Lavage with Suction (psi):  12 psi Pulsed Lavage with Suction - Normal Saline Used: 1000 mL Pulsed Lavage Tip: Tip with splash shield Selective Debridement Selective Debridement - Location: sacral Selective Debridement - Tools Used: Forceps;Scissors;Scalpel Selective Debridement - Tissue Removed: Yellow necrotic tissue   Wound Assessment and Plan  Wound Therapy - Assess/Plan/Recommendations Wound Therapy - Clinical Statement: pt will benefit from hydro for the pulsed lavage that will help cleansed wound, decrease bacterial load and otherwise prep wound for selective debridement. Wound Therapy - Functional Problem List: Relative immobility, decreased strength, compromised nutrition and skin integrity.  See PT notes Factors Delaying/Impairing Wound Healing: Incontinence;Infection - systemic/local;Immobility;Multiple medical problems Hydrotherapy Plan: Debridement;Dressing change;Patient/family education;Pulsatile lavage with suction Wound Therapy - Frequency: 6X / week Wound Therapy - Current Recommendations: PT;Case manager/social work;WOC nurse Wound Therapy - Follow Up Recommendations: Home health RN;Skilled nursing facility Wound Plan: see above  Wound Therapy Goals- Improve the function of patient's integumentary system by progressing the wound(s) through the phases of wound healing (inflammation - proliferation - remodeling) by: Decrease Necrotic Tissue to: 25 % Decrease Necrotic Tissue - Progress: Progressing toward goal Increase Granulation Tissue to: 75% including any viable tissues Increase Granulation Tissue - Progress: Progressing toward goal Improve Drainage Characteristics: Min;Serous Improve Drainage Characteristics - Progress: Progressing toward goal Goals/treatment plan/discharge plan were made with and agreed upon by patient/family: Yes Time For Goal Achievement: 7 days Wound Therapy - Potential for Goals: Good  Goals will be updated until maximal potential achieved or discharge criteria met.   Discharge criteria: when goals achieved, discharge from hospital, MD decision/surgical intervention, no progress towards goals, refusal/missing three consecutive treatments without notification or medical reason.  GP     Tessie Fass Jowan Skillin  06/17/2017, 12:09 PM 06/17/2017  Donnella Sham, PT [phone]  (pager)

## 2017-06-17 NOTE — Progress Notes (Signed)
   06/17/17 1100  PT - Assessment/Plan  Recommendations for Other Services Rehab consult  Follow Up Recommendations CIR;Supervision/Assistance - 24 hour  Pt cannot go to [facility] as they can't handle LVAD pts.  Pt is appropriate for Rehab as well.  CM to reconsult Rehab to see if they can take pt.  Thanks.  [facility] [phone] (pager)

## 2017-06-17 NOTE — Telephone Encounter (Signed)
CSW contacted wife via phone today to follow up and inform of pending evaluations for rehab program (LTACH vs. CIR). Wife adamant about patient not going to [facility] if determined LTACH is the appropriate referral.  Wife at work today as she is school principal and hoping to be back to patient tomorrow or next day. Family at bedside with patient. CSW continues to provide support and encouragement. Lasandra Beech, LCSW, CCSW-MCS [phone]

## 2017-06-17 NOTE — Progress Notes (Signed)
  Speech Language Pathology Treatment: Dysphagia  Patient Details Name: James [name] MRN: [id number] DOB: 03-12-1961 Today's Date: 06/17/2017 Time: 8875-7972 SLP Time Calculation (min) (ACUTE ONLY): 35 min  Assessment / Plan / Recommendation Clinical Impression  Pt fatigued today but quite willing to participate in therapy.  Assessed for use of respiratory muscle trainer to address recovery of swallow/cough.  (MEP reference for age =68; LLN= 72.) Pt achieved 20; EMT set at 15.  Pt participated in two sets of five, requiring mod cues, describing moderate effort and demonstrating signs of fatigue.  Reviewed basic benefits of RMT with pt and his mother.  Pt requesting ice chips; oral care provided, HOB elevated, pt consumed limited (3-4) ice chips leading to wet cough.    Recovery of functional swallow is likely to be a slow process - pt/family will need to consider longer term plan for nutrition at some point.  I did not discuss this with pt, mother.  We focused today on the positives of exercise for swallow recovery.    HPI HPI: 56 year old male admitted 04/19/2017 for LVAD placement. PMH significant for gout, DM2, HTN, CKD, CAD s/p CABGx6 (2012), systemic heart failure, [employer] ICD. 8/7 with decreased MS, confusion.  Head CT with right superior cerebellar artery lacunar infarcts, not acute.  Pt has had 4 intubations this admission.  The first three intubations were for 24 hour periods; the last 8/8-8/22.        SLP Plan  Continue with current plan of care       Recommendations  Diet recommendations: NPO                Oral Care Recommendations: Oral care prior to ice chip/H20 SLP Visit Diagnosis: Dysphagia, pharyngeal phase (R13.13);Dysphagia, pharyngoesophageal phase (R13.14) Plan: Continue with current plan of care       GO              Gala Padovano L. Samson Frederic, [place] CCC/SLP Pager [phone]   Blenda Mounts Laurice 06/17/2017, 3:41 PM

## 2017-06-18 LAB — BASIC METABOLIC PANEL
ANION GAP: 5 (ref 5–15)
BUN: 48 mg/dL — ABNORMAL HIGH (ref 6–20)
CHLORIDE: 122 mmol/L — AB (ref 101–111)
CO2: 26 mmol/L (ref 22–32)
Calcium: 8 mg/dL — ABNORMAL LOW (ref 8.9–10.3)
Creatinine, Ser: 1.17 mg/dL (ref 0.61–1.24)
GFR calc non Af Amer: >60 mL/min (ref >60)
Glucose, Bld: 163 mg/dL — ABNORMAL HIGH (ref 65–99)
Potassium: 3.2 mmol/L — ABNORMAL LOW (ref 3.5–5.1)
Sodium: 153 mmol/L — ABNORMAL HIGH (ref 135–145)

## 2017-06-18 LAB — GLUCOSE, CAPILLARY
Glucose-Capillary: 153 mg/dL — ABNORMAL HIGH (ref 65–99)
Glucose-Capillary: 143 mg/dL — ABNORMAL HIGH (ref 65–99)
Glucose-Capillary: 162 mg/dL — ABNORMAL HIGH (ref 65–99)
Glucose-Capillary: 143 mg/dL — ABNORMAL HIGH (ref 65–99)
Glucose-Capillary: 137 mg/dL — ABNORMAL HIGH (ref 65–99)

## 2017-06-18 LAB — CBC
HCT: 27.4 % — ABNORMAL LOW (ref 39.0–52.0)
Hemoglobin: 8.7 g/dL — ABNORMAL LOW (ref 13.0–17.0)
MCH: 29.4 pg (ref 26.0–34.0)
MCHC: 31.8 g/dL (ref 30.0–36.0)
MCV: 92.6 fL (ref 78.0–100.0)
Platelets: 174 10*3/uL (ref 150–400)
RBC: 2.96 MIL/uL — AB (ref 4.22–5.81)
RDW: 21 % — ABNORMAL HIGH (ref 11.5–15.5)
WBC: 8.4 10*3/uL (ref 4.0–10.5)

## 2017-06-18 LAB — PROTIME-INR
INR: 1.41
Prothrombin Time: 17.1 seconds — ABNORMAL HIGH (ref 11.4–15.2)

## 2017-06-18 LAB — COOXEMETRY PANEL
CARBOXYHEMOGLOBIN: 1.7 % — AB (ref 0.5–1.5)
METHEMOGLOBIN: 1.2 % (ref 0.0–1.5)
O2 Saturation: 79.9 %
Total hemoglobin: 8.6 g/dL — ABNORMAL LOW (ref 12.0–16.0)

## 2017-06-18 LAB — HEPARIN LEVEL (UNFRACTIONATED): Heparin Unfractionated: 0.27 IU/mL — ABNORMAL LOW (ref 0.30–0.70)

## 2017-06-18 LAB — LACTATE DEHYDROGENASE: LDH: 170 U/L (ref 98–192)

## 2017-06-18 LAB — MAGNESIUM: Magnesium: 1.6 mg/dL — ABNORMAL LOW (ref 1.7–2.4)

## 2017-06-18 MED ORDER — DOXAZOSIN MESYLATE 2 MG PO TABS
2.0000 mg | ORAL_TABLET | Freq: Every day | ORAL | Status: DC
  Administered 2017-06-18: 2 mg via ORAL
  Filled 2017-06-18 (×2): qty 1

## 2017-06-18 MED ORDER — POTASSIUM CHLORIDE 20 MEQ/15ML (10%) PO SOLN
40.0000 meq | Freq: Once | ORAL | Status: AC
  Administered 2017-06-18: 40 meq
  Filled 2017-06-18: qty 30

## 2017-06-18 MED ORDER — HEPARIN (PORCINE) IN NACL 100-0.45 UNIT/ML-% IJ SOLN
1700.0000 [IU]/h | INTRAMUSCULAR | Status: DC
  Administered 2017-06-18 – 2017-06-19 (×2): 1700 [IU]/h via INTRAVENOUS
  Filled 2017-06-18 (×2): qty 250

## 2017-06-18 MED ORDER — WARFARIN SODIUM 5 MG PO TABS
5.0000 mg | ORAL_TABLET | Freq: Once | ORAL | Status: AC
  Administered 2017-06-18: 5 mg via ORAL
  Filled 2017-06-18: qty 1

## 2017-06-18 MED ORDER — SPIRONOLACTONE 25 MG PO TABS
25.0000 mg | ORAL_TABLET | Freq: Every day | ORAL | Status: DC
  Administered 2017-06-18 – 2017-06-25 (×8): 25 mg via ORAL
  Filled 2017-06-18 (×8): qty 1

## 2017-06-18 MED ORDER — POTASSIUM CHLORIDE 20 MEQ PO PACK: 40.0000 meq | PACK | Freq: Once | ORAL | Status: DC

## 2017-06-18 MED ORDER — CHLORHEXIDINE GLUCONATE CLOTH 2 % EX PADS
6.0000 | MEDICATED_PAD | Freq: Every morning | CUTANEOUS | Status: DC
  Administered 2017-06-18 – 2017-06-25 (×8): 6 via TOPICAL

## 2017-06-18 MED ORDER — MAGNESIUM SULFATE 2 GM/50ML IV SOLN
2.0000 g | Freq: Once | INTRAVENOUS | Status: AC
  Administered 2017-06-18: 2 g via INTRAVENOUS
  Filled 2017-06-18: qty 50

## 2017-06-18 MED ORDER — POTASSIUM CHLORIDE CRYS ER 20 MEQ PO TBCR: 40.0000 meq | EXTENDED_RELEASE_TABLET | Freq: Once | ORAL | Status: DC

## 2017-06-18 NOTE — Progress Notes (Signed)
Occupational Therapy Treatment Patient Details Name: James [name] MRN: [id number] DOB: [birth year] Today's Date: 06/18/2017    History of present illness Pt adm with acute on chronic heart failure and underwent Heartware HVAD implant on 6/28. Returned to OR later that day for evacuation of mediastinal hematoma. On 7/4 developed ileus with likely aspiration and intubated 7/4-7/6.   Pt developed Heme + stools with progressive anemia as well as  [name] 05/02/17.  He underwent EGD with showed suspected AVM May 10, 2017. He was intubated for procedure and extubated 05/08/17.   He developed confusion 8/7 - head CT showed small bil. subdural hygromas or hematomas, not significantly changed since 05/08/17; and Rt superior cerebellar lacunar infarct which were new since 7/19, but have a chronic appearance. He developed worsened respirtory status 8/8 requiring intubation; developed gout flare up; and was extubated (one way as pt does not desire trach) on 8/23.   PMH - gout, DMII, HTN, CKD, CAD S/P CABGx6 2012, chronic systolic heart failure and St jude ICD.   OT comments  Pt seen with PT.  Pt incontinent of stool and was assisted with peri care bed level requiring max A +2 for rolling.  He was then transferred to tilt table with maxi sky.  He tolerated weightbearing/standing in tilt table at 30*, progressing to 60* for ~12 mins total time before fatiguing.   Pt continues to be motivated.  Feel he will benefit from CIR once medically stable.    Follow Up Recommendations  CIR;Supervision/Assistance - 24 hour    Equipment Recommendations  3 in 1 bedside commode    Recommendations for Other Services Rehab consult    Precautions / Restrictions Precautions Precautions: Fall Precaution Comments: Heartware HVAD       Mobility Bed Mobility Overal bed mobility: Needs Assistance Bed Mobility: Rolling Rolling: Max assist;+2 for physical assistance         General bed mobility comments: Pt rolled Lt and Rt with use  of bed rail he is able to assist minimally   Transfers                 General transfer comment: Pt transferred bed <> tilt table using maxi sky with total A +2     Balance Overall balance assessment: Needs assistance Sitting-balance support: Feet supported;Bilateral upper extremity supported       Standing balance support:  (strapped into tilt table ) Standing balance-Leahy Scale: Zero Standing balance comment: Pt tolerated standing in tilt table initially at 30*, progressing to 60* with total time up ~12 mins.  Pt denied dizziness                            ADL either performed or assessed with clinical judgement   ADL                               Toileting- Clothing Manipulation and Hygiene: Total assistance;+2 for physical assistance;Bed level Toileting - Clothing Manipulation Details (indicate cue type and reason): Pt incontinent of stool.  Assisted with peri care in supine              Vision       Perception     Praxis      Cognition Arousal/Alertness: Awake/alert Behavior During Therapy: Flat affect Overall Cognitive Status: Impaired/Different from baseline Area of Impairment: Attention;Memory;Following commands;Problem solving  Current Attention Level: Sustained Memory: Decreased short-term memory;Decreased recall of precautions Following Commands: Follows one step commands consistently Safety/Judgement: Decreased awareness of deficits   Problem Solving: Slow processing;Decreased initiation;Difficulty sequencing;Requires verbal cues;Requires tactile cues General Comments: Pt very quiet with minimal interactions.  Requires increased time to respond         Exercises     Shoulder Instructions       General Comments      Pertinent Vitals/ Pain       Pain Assessment: 0-10 Pain Score: 7  Pain Location: buttocks/sacrum  Pain Descriptors / Indicators: Sore;Grimacing;Guarding Pain  Intervention(s): Repositioned  Home Living                                          Prior Functioning/Environment              Frequency  Min 3X/week        Progress Toward Goals  OT Goals(current goals can now be found in the care plan section)  Progress towards OT goals: Progressing toward goals     Plan Discharge plan remains appropriate    Co-evaluation    PT/OT/SLP Co-Evaluation/Treatment: Yes Reason for Co-Treatment: Complexity of the patient's impairments (multi-system involvement);To address functional/ADL transfers;For patient/therapist safety   OT goals addressed during session: Strengthening/ROM      AM-PAC PT "6 Clicks" Daily Activity     Outcome Measure   Help from another person eating meals?: Total Help from another person taking care of personal grooming?: A Lot Help from another person toileting, which includes using toliet, bedpan, or urinal?: Total Help from another person bathing (including washing, rinsing, drying)?: Total Help from another person to put on and taking off regular upper body clothing?: Total Help from another person to put on and taking off regular lower body clothing?: Total 6 Click Score: 7    End of Session Equipment Utilized During Treatment: Oxygen  OT Visit Diagnosis: Muscle weakness (generalized) (M62.81)   Activity Tolerance Patient tolerated treatment well   Patient Left in bed;with call bell/phone within reach;with nursing/sitter in room   Nurse Communication Mobility status;Need for lift equipment        Time: [id number] OT Time Calculation (min): 69 min  Charges: OT General Charges $OT Visit: 1 Visit OT Treatments $Therapeutic Activity: 38-52 mins  Reynolds AmericanWendi Orell Hurtado, OTR/L [phone]    [name], Darreld Hoffer M 06/18/2017, 12:55 PM

## 2017-06-18 NOTE — Progress Notes (Addendum)
LVAD Inpatient Coordinator Rounding Note:  Admitted 04/13/2017 due to A/C Heart failure.   HeartWare LVAD implanted on 04/03/2017 by Dr. Laneta SimmersBartle as DT VAD.  Transferred to 4E 05/17/17.  Transferred back to ICU 2H09 on 05/28/17 with possible aspiration pneumonia requiring re-intubation.   Vital signs: Temp: 99.6 HR: 81 Doppler: 130- personally performed Auto BP: 110/84 (92) O2 Sat: 99% on RA Wt in lbs: Marland Kitchen... 217>223>224>224>234>240>245>235>234>236>239>234>232>232>232  LVAD interrogation reveals:  Speed: 2540 Flow: 3.8 Power: 3.4w Alarms: none Peak: 7.3 Trough: 1.0 HCT:  27 Low flow alarm setting: 1.7 High watt alarm setting: 5.5  Suction: OFF  Lavare cycle: on  Blood Products: 6/28> 5 PRBC's, 6 FFP 7/1> 1 PRBC 7/9> 1 PRBC 7/13>3 PRBC 7/14>3 PRBC 7/15> 1 PRBC 7/16>3PRBC 7/17> 3 PRBC 7/20> 1 PRBC 8/6> 1 PRBC 8/10 > 1 PRBC 8/22 > 1 PRBC  Gtts: Angiomax per pharmacy  NG inserted with tube feeding started 05/27/17; placed on hold 05/28/17   Cortrak inserted 06/07/17 - TF started 06/08/17 - currently running at 35 ml/hr  GI: 04/23/17 developed ileus with respiratory compromise - NG with 3 L suctioned out 05/03/17 - melena due to acute GI bleed -- PCs given as needed Oct 06, 2017 - 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  Neuro: 05/27/17- Neurology consult for lethargy, mouth tremors, not vocal, not following commands> Head CT and EEG 05/28/17 - Continual EEG monitoring  05/30/17 - started Depakote for seizure activity - stopped 06/03/17 for rash - will start Keppra   Arrhythmia: 04/24/17 - Afib with RVR - started amiodarone 05/02/17 - 2 sustained episodes of VT - cardioverted x 1; second broke with overdrive pacing 7/17- wide complex tachycardia- Amio bolus x3 and gtt at 60 mg/hr 05/07/17 - sustained VT - converted with overdrive pacing; Lidocaine started in addition to amiodarone 05/08/17 - Lidocaine stopped due to confusion  (lido level 12.4) 7/22 - Sustained VT with rate 130 after getting IV lasix. Pace terminated, back on IV amiodarone 05/14/17 - sustained VT - converted 3 separate occassions with overdrive pacing 05/19/17 - sustained VT-converted with overdrive pacing  8/1 - VT pace terminated 8/2 - VT started Sotalol 05/26/17- Afib rate in the 50's 05/27/17 - NSR   Respiratory: 04/18/17 - Extubated 04/23/17 - re-intubated due to respiratory failure secondary to suspected aspiration pneumonia 04/25/17-extubated Oct 06, 2017- intubated for EGD Oct 06, 2017- extubated 05/28/17 - re-intubated for suspected aspiration pneumonia 06/11/17 - one way extubation  Drive Line: Daily Dressing Kits with Aquacell AG silver strips per protocol - will advance to every other day dressing changes. Due today.  Labs:  LDH trend: .Marland Kitchen...161>096>045>409>811>914>782>956>213>086>578>469>629>528158>156>156>146>154>188>142>138>127>128>148>164>175>170  INR trend: .... .73>1.52>1.40>1.57>1.52>1.39>1.43>1.37>1.46>1.41  Anticoagulation Plan: -INR Goal: 2-2.5  -ASA Dose: 325 mg- on hold  Adverse Events on VAD: [facility], [facility] - Extubated on 04/18/17 - 04/23/17 Ileus with vomiting; re-intubation for acute respiratory failure; probable aspiration pneumonia -7/13-GI bleed - 7/17 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 He had epinephrine, APC, and 2 clips.  - 7/19 - Lidocaine gtt stopped due to confusion (lido toxicity) - 8/7- Neurology consult- lethargy, mouth tremors, not vocal, not following commands>Head CT and EEG, blood and urine cultures sent, Ammonia-20 -8/8 - re-intubated for possible aspiration pneumonia   Plan/Recommendations:  1. Currently every other day dressing changes. Due 8/30 2. Please page VAD pager for any equipment or patient needs   Marcellus ScottLesley Shakeela Rabadan RN, VAD Coordinator 24/7 pager [phone]

## 2017-06-18 NOTE — Progress Notes (Signed)
Patient ID: Edd FabianHarry D Dimock, male   DOB: February 11, 1961, 56 y.o.   MRN: [id number]  SICU Evening Rounds  Hemodynamically stable  VAD parameters good  Urine output good  More alert today. Was up on tilt table and tolerated that well

## 2017-06-18 NOTE — Progress Notes (Signed)
Physical Therapy Treatment Patient Details Name: James FabianHarry D Mccanless MRN: [id number] DOB: [birth year] Today's Date: 06/18/2017    History of Present Illness Pt adm with acute on chronic heart failure and underwent Heartware HVAD implant on 6/28. Returned to OR later that day for evacuation of mediastinal hematoma. On 7/4 developed ileus with likely aspiration and intubated 7/4-7/6.   Pt developed Heme + stools with progressive anemia as well as  Kirby FunkVtach 05/02/17.  He underwent EGD with showed suspected AVM 01-14-17. He was intubated for procedure and extubated 05/08/17.   He developed confusion 8/7 - head CT showed small bil. subdural hygromas or hematomas, not significantly changed since 05/08/17; and Rt superior cerebellar lacunar infarct which were new since 7/19, but have a chronic appearance. He developed worsened respirtory status 8/8 requiring intubation; developed gout flare up; and was extubated (one way as pt does not desire trach) on 8/23.   PMH - gout, DMII, HTN, CKD, CAD S/P CABGx6 2012, chronic systolic heart failure and [employer] jude ICD.    PT Comments    Pt admitted with above diagnosis. Pt currently with functional limitations due to balance, strength and endurance deficits. Pt was able to tolerate tilt table up to 60 degrees for 12 min total in tilt table.  Performed quad sets in tilt table.  Tolerated well.  Pt will benefit from skilled PT to increase their independence and safety with mobility to allow discharge to the venue listed below.     Follow Up Recommendations  CIR;Supervision/Assistance - 24 hour     Equipment Recommendations  Other (comment) (TBD next venue)    Recommendations for Other Services Rehab consult     Precautions / Restrictions Precautions Precautions: Fall Precaution Comments: Heartware HVAD Restrictions Weight Bearing Restrictions: No    Mobility  Bed Mobility Overal bed mobility: Needs Assistance Bed Mobility: Rolling Rolling: Max assist;+2 for physical  assistance         General bed mobility comments: Pt rolled Lt and Rt with use of bed rail he is able to assist minimally.  Pt with large BM, had to roll pt multiple x to clean and place lift pad.    Transfers Overall transfer level: Needs assistance               General transfer comment: Pt transferred bed <> tilt table using maxi sky with total A +2   Ambulation/Gait                 Stairs            Wheelchair Mobility    Modified Rankin (Stroke Patients Only)       Balance Overall balance assessment: Needs assistance         Standing balance support:  (strapped into tilt table ) Standing balance-Leahy Scale: Zero Standing balance comment: Pt tolerated standing in tilt table initially at 30*, progressing to 60* with total time up ~12 mins.  Pt denied dizziness.  VSS throughout including LVAD parameters.                            Cognition Arousal/Alertness: Awake/alert Behavior During Therapy: Flat affect Overall Cognitive Status: Impaired/Different from baseline Area of Impairment: Attention;Memory;Following commands;Problem solving                   Current Attention Level: Sustained Memory: Decreased short-term memory;Decreased recall of precautions Following Commands: Follows one step commands consistently Safety/Judgement: Decreased awareness of  deficits Awareness: Intellectual Problem Solving: Slow processing;Decreased initiation;Difficulty sequencing;Requires verbal cues;Requires tactile cues General Comments: Pt very quiet with minimal interactions.  Requires increased time to respond       Exercises      General Comments        Pertinent Vitals/Pain Pain Assessment: Faces Faces Pain Scale: Hurts little more Pain Location: buttocks/sacrum  Pain Descriptors / Indicators: Sore;Grimacing;Guarding Pain Intervention(s): Limited activity within patient's tolerance;Monitored during session;Premedicated before  session;Repositioned    Home Living                      Prior Function            PT Goals (current goals can now be found in the care plan section) Progress towards PT goals: Progressing toward goals    Frequency    Min 3X/week      PT Plan Current plan remains appropriate    Co-evaluation PT/OT/SLP Co-Evaluation/Treatment: Yes Reason for Co-Treatment: Complexity of the patient's impairments (multi-system involvement) PT goals addressed during session: Mobility/safety with mobility        AM-PAC PT "6 Clicks" Daily Activity  Outcome Measure  Difficulty turning over in bed (including adjusting bedclothes, sheets and blankets)?: Unable Difficulty moving from lying on back to sitting on the side of the bed? : Unable Difficulty sitting down on and standing up from a chair with arms (e.g., wheelchair, bedside commode, etc,.)?: Unable Help needed moving to and from a bed to chair (including a wheelchair)?: Total Help needed walking in hospital room?: Total Help needed climbing 3-5 steps with a railing? : Total 6 Click Score: 6    End of Session Equipment Utilized During Treatment: Oxygen (Tilt table) Activity Tolerance: Patient tolerated treatment well Patient left: with call bell/phone within reach;in bed;with nursing/sitter in room Nurse Communication: Mobility status;Need for lift equipment PT Visit Diagnosis: Muscle weakness (generalized) (M62.81);Difficulty in walking, not elsewhere classified (R26.2);Other abnormalities of gait and mobility (R26.89);Pain Pain - Right/Left: Left Pain - part of body: Knee;Leg;Shoulder;Arm;Hand;Ankle and joints of foot;Hip     Time: 1610-9604 PT Time Calculation (min) (ACUTE ONLY): 69 min  Charges:  $Gait Training: 8-22 mins $Therapeutic Activity: 8-22 mins                    G Codes:       Zorianna Taliaferro,PT Acute Rehabilitation [phone] (pager)    Berline Lopes 06/18/2017, 6:17 PM

## 2017-06-18 NOTE — Progress Notes (Signed)
CSW met with patient, wife and mother at bedside. Patient states he had speech therapy, OT and PT today. Patient very weak and only responding with one word answers as he is wiped from therapies today. Wife asking appropriate questions and encourging patient. CSW provided supportive intervention and encouragement to patient for continued participation in therapy. Patient and wife appreciative of support and will continue to follow. Raquel Sarna, [place], [place]

## 2017-06-18 NOTE — Progress Notes (Signed)
Patient ID: James [name], male   DOB: 02-13-1961, 56 y.o.   MRN: [id number]   Advanced Heart Failure VAD Team Note  Subjective:    Events: --HVAD placed 6/28.  Returned to the OR that evening with high chest tube output, evacuation of mediastinal hematoma.  --Extubated 6/29. Milrinone stopped on 7/4. --7/4, patient developed ileus with respiratory compromise. NGT placed with 3 L suctioned out.  CXR with suspicion for aspiration PNA.  Patient had to be intubated.  He went into atrial fibrillation with RVR.  He became hypotensive and was started on norepinephrine and phenylephrine.  Amiodarone gtt begun.    --Extubated again on 7/6.  -- 7/13 Had 2 sustained episodes of VT. Had to be cardioverted 1. Second episode broke with overdrive pacing with Dr. Caryl Comes. VAD speed turned down to 2700.  --Melena due to acuteGI bleed --> 2 units PRBCs 7/14, 2 units 7/15. 3 U PRBCs 7/16,  04/28/2017 3 UPRBCs.  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  He had epinephrine, APC, and 2 clips. Intubated prior to procedure and placed on norepinephrine. Speed dropped to 2660.  --7/18 Back in VT with overdrive pacing. Lidocaine was started in addition to amiodarone. Extubated. --7/19 lidocaine stopped with confusion. Neuro consulted.  EEG normal.  CT of head no acute findings. 7/20 confusion had resolved.  --1 unit PRBCs 7/20.  --7/22, developed sustained VT with rate around 130 shortly after getting IV Lasix.  We were able to pace him out and back to NSR.  He was put back on amiodarone gtt.  --More VT on 7/25, paced out.  Back on IV amiodarone gtt.  NSR.  -- 7/31 Ramp ECHO decrease speed 2600 rpm.  -- 8/1 VT- paced out -- 8/2 VT - started on sotalol -- 8/6 Hgb 7.5, 1 unit PRBCs -- 8/7 Hgb 9.5, confused.  Head CT with right superior cerebellar artery lacunar infarcts, not acute. -- 8/8 Possible aspiration. Reintubated. -- 8/10 possible seizure => Depakote.  CT head no  change.  1 unit PRBCs.  -- Multiple suction alarms on 8/17 night which persisted so suction alarm deactivated.  -- Extubated 8/23  MAP remains in 90s.  He is alert today, converses.  Wants to stand up.   Remains on heparin gtt + warfarin.  INR 1.4  Getting hydrotherapy.   Weight stable. CVP remains 7.   Sodium 155 => 153  HVAD INTERROGATION:  HVAD:  Flow 3.4 liters/min, speed 2450,  power 3.4 W,  Peak 7 Trough 1.2. Suction alarms off. Lavare on.   Objective:    Vital Signs:   Temp:  [97.8 F (36.6 C)-99.6 F (37.6 C)] 99.6 F (37.6 C) (08/29 0818) Pulse Rate:  [60-83] 81 (08/29 0700) Resp:  [24-33] 25 (08/29 0700) BP: (108-138)/(76-97) 110/84 (08/29 0700) SpO2:  [95 %-100 %] 100 % (08/29 0700) Weight:  [232 lb 5.8 oz (105.4 kg)] 232 lb 5.8 oz (105.4 kg) (08/29 0400) Last BM Date: 06/17/17 Mean arterial Pressure 90s Intake/Output:   Intake/Output Summary (Last 24 hours) at 06/18/17 0829 Last data filed at 06/18/17 0700  Gross per 24 hour  Intake             2589 ml  Output             1725 ml  Net              864 ml     Physical Exam    Physical Exam:  CVP 7 GENERAL: Fatigued and chronically ill appearing.  NAD.  HEENT: Normal. NECK: Supple, JVP 7-8 cm. Carotids OK.  CARDIAC:  Mechanical heart sounds with LVAD hum present.  LUNGS:  CTAB, normal effort.  ABDOMEN:  NT, ND, no HSM. No bruits or masses. +BS  LVAD exit site: Dressing dry and intact. No erythema or drainage. Stabilization device present and accurately applied. Driveline dressing changed daily per sterile technique. EXTREMITIES:  Warm and dry. No cyanosis or clubbing. Diffuse gouty abnormalities. LEs wrapped. 1-2+ edema into thighs.  NEUROLOGIC:  Alert & oriented x 3. Cranial nerves grossly intact. Moves all 4 extremities w/o difficulty. Affect flat but appropriate.    Telemetry   Personally reviewed, NSR 70s   Labs   Basic Metabolic Panel:  Recent Labs Lab 06/13/17 0248 06/14/17 0253  06/16/17 0328 06/17/17 0232 06/18/17 0400  NA 155* 154* 154* 155* 153*  K 4.0 3.9 3.6 3.4* 3.2*  CL 121* 123* 123* 123* 122*  CO2 _0 GLUCOSE 110* 129* 144* 149* 163*  BUN 60* 58* 54* 52* 48*  CREATININE 1.41* 1.43* 1.25* 1.23 1.17  CALCIUM 8.5* 8.4* 8.4* 8.4* 8.0*  MG  --   --   --   --  1.6*    Liver Function Tests: No results for input(s): AST, ALT, ALKPHOS, BILITOT, PROT, ALBUMIN in the last 168 hours. No results for input(s): LIPASE, AMYLASE in the last 168 hours. No results for input(s): AMMONIA in the last 168 hours.  CBC:  Recent Labs Lab 06/13/17 0248 06/14/17 0253 06/16/17 0328 06/17/17 0232 06/18/17 0400  WBC 13.1* 11.1* 10.9* 10.0 8.4  HGB 9.2* 10.9* 9.9* 9.4* 8.7*  HCT 29.9* 35.4* 31.9* 30.8* 27.4*  MCV 91.7 91.9 93.0 93.1 92.6  PLT 279 278 256 218 174    INR:  Recent Labs Lab 06/14/17 0253 06/15/17 0426 06/16/17 0328 06/17/17 0232 06/18/17 0452  INR 1.36 1.37 1.39 1.46 1.41   Other results:    Imaging   No results found.  Medications:    Scheduled Medications: . amiodarone  200 mg Per Tube Daily  . amLODipine  10 mg Oral BID  . chlorhexidine gluconate (MEDLINE KIT)  15 mL Mouth Rinse BID  . Chlorhexidine Gluconate Cloth  6 each Topical q morning - 10a  . collagenase   Topical Daily  . doxazosin  2 mg Oral Daily  . febuxostat  120 mg Oral Daily  . feeding supplement (NEPRO CARB STEADY)  1,000 mL Per Tube Q24H  . feeding supplement (PRO-STAT SUGAR FREE 64)  60 mL Per Tube BID  . free water  400 mL Per Tube Q4H  . hydrALAZINE  100 mg Oral Q8H  . insulin aspart  0-15 Units Subcutaneous Q4H  . levETIRAcetam  500 mg Per Tube BID  . levothyroxine  50 mcg Per NG tube QAC breakfast  . magnesium oxide  400 mg Per Tube Daily  . mouth rinse  15 mL Mouth Rinse Q4H  . mexiletine  300 mg Per Tube Q12H  . pantoprazole sodium  40 mg Per Tube BID  . potassium chloride  40 mEq Per Tube Once  . predniSONE  5 mg Oral Q breakfast  .  sodium chloride flush  10-40 mL Intracatheter Q12H  . sotalol  80 mg Per Tube Daily  . spironolactone  25 mg Oral Daily  . Warfarin - Pharmacist Dosing Inpatient   Does not apply q1800    Infusions: . sodium chloride 10  mL/hr at 06/18/17 0700  . heparin 1,600 Units/hr (06/18/17 0700)  . magnesium sulfate 1 - 4 g bolus IVPB      PRN Medications: acetaminophen (TYLENOL) oral liquid 160 mg/5 mL, albuterol, docusate, fentaNYL (SUBLIMAZE) injection, Gerhardt's butt cream, hydrALAZINE, midazolam, morphine injection, morphine, neomycin-bacitracin-polymyxin, ondansetron (ZOFRAN) IV, oxyCODONE, pneumococcal 23 valent vaccine, silver nitrate applicators, sodium chloride flush   Patient Profile   56 yo with CAD s/p CABG, ischemic cardiomyopathy/chronic systolic CHF, tophaceous gout, and CKD stage 3 was admitted for diuresis and consideration for LVAD placement. S/p HVAD on 6/28  Assessment/Plan:    1. Acute/chronic systolic CHF s/p HVAD: Ischemic cardiomyopathy.  St Jude ICD.  Echo (6/18) with EF 15%, mildly dilated RV with moderately decreased systolic function.  s/p HVAD placement 6/28 and had to return to OR to evacuate mediastinal hematoma.  He had been weaned off pressors/milrinone, but developed ileus w/ likely aspiration event 7/4, re-intubated, developed afib/RVR requiring norepinephrine. Extubated 7/6.  VAD speed turned down to 2540 with VT. VAD parameters currently look good.  Milrinone stopped 7/17 with recurrent VT.  He was started back on norepinephrine 8/9 due to soft BP after intubation and suspected septic shock, now off norepinephrine.  MAP still up in 90s, getting hydralazine per tube as well as amlodipine and spironolactone.  Wide peak/trough may be related to HTN. Volume status fine centrally with CVP 7. Main issue is massive 3rd spacing but low intravascular volume.  Legs now wrapped and weight stable. - Can hold off on Lasix today especially with Na still high.  - MAP continues to  run quite high.  Will increase spironolactone to 25 mg daily and add doxazosin 2 mg daily.   - Off ASA with GI Bleed  - Goal INR 2-2.5. INR 1.4. Continue heparin drip + coumadin. Stop heparin drip once INR 2.   - VAD parameters stable except low trough. Suspect this is 2/2 HTN.  2. AKI on CKD stage 3: Creatinine down to 1.17.  3. Symptomatic anemia due to acute UGI bleeding:  Received 3 units PRBCs 7/16 and 3 units PRBCs on 05/17/2017. 1 unit PRBCs 7/20. 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  Requiring 2 clips + epi + APC. 1 unit PRBCs 8/6.  He had 1 unit PRBCs 8/10 to provide volume.  1 unit PRBCs on 8/22.  No overt bleeding.   - Got Feraheme 7/28.  - Continue Protonix 40 po bid. - Transfuse hgb < 8.  4. Ventricular tachycardia: Status post ventricular tachycardia 2 on 7/13. Possible suction event. VAD speed turned down to 2700.  Recurrent VT 7/17. EP called to bedside. Overdrive pacing successful for about 10 minutes but VT recurred.  Milrinone turned off, remained in VT overnight but hemodynamically stable.  Speed decreased to 2600.  On 7/18, we were able to pace him out of VT.  Lidocaine added then stopped due to confusion/mental status changes.  He had VT 7/22 about 30 minutes after getting IV Lasix, had to be paced out again => may have been due to rapid fluid shift.  Pt paced out of VT 3 separate occasions 7/25. Got amio bolus x 2 and started back on IV infusion.  Transitioned to amiodarone 400 mg tid.  On 8/1 had recurrent VT and paced out again.  Recurrent VT 8/2 per EP sotalol started along with amiodarone, sotalol decreased to once daily with bradycardia. Remains quiescent.  - Continue mexiletine to 300 mg BID, sotalol 80  mg daily.  - Continue amiodarone 200 mg daily.  - Keep K > 4.0 Mg > 2.0 => give Mg today.  5. Paroxysmal Atrial fibrillation:  Developed atrial fibrillation with RVR in setting of aspiration PNA on 7/4.  In NSR currently on triple AA  therapy. No change.  6. Malnutrition:  Continue tube feeds via Cor-track. Nutrition following. No change.  7. Aspiration PNA with acute hypoxemic respiratory failure on 7/4: He was extubated on 7/6. Tracheal aspirate with Klebsiella and citrobacter. Both sensitive to Zosyn. Completed abx 7/13. Possible aspiration 8/8. Treated with meropenem. Stopped 8/24 - Off abx. No change.  - He remains NPO, everything via Cor-track.  8. Acute Respiratory Failure: Intubated 7/17 in setting of complex GI intervention. Extubated 7/18. Re-intubated 8/8.  Weak respiratory muscles. He has not wanted a tracheostomy.  Extubated on 8/22.   - Remains stable off vent.  9. CAD: s/p CABG 2012. No chest pain.  Off aspirin due to GI bleeding. No change.  10. Gout: Severe tophaceous gout.  - Continue uloric. Stopped colchicine with diarrhea. Flare now resolved. Will stop prednisone after tomorrow's dose.   11. Delirium: Appreciate neuro input Possibly related to lidocaine.  Lidocaine stopped. CT of head negative. EEG ok. No further workup per neuro. Neuro  reconsulted 8/7 with AMS => ?related to fever/infection.  He has been more clear on vent.  CT head 05/27/17 and 8/10 with lacunar infarcts but do not appear acute.  Seizure activity versus myoclonus => Depakote started.  Switched to [place] with ?drug rash from Depakote.   - Currently resolved. 12. UTI: on vancomycin and meropenum. Urine culture with no growth - Meropenem stopped 8/24. No change.  13. Unstageable Pressure Ulcer: Sacrum. Consult WOC - Needs to start santyl daily to enzymatically debride. Reposition R to L.  - Continue with hydrotherapy. No change. Appreciate WOC care.  14. Bradycardia: With hypotension.  Currently back-up pacing set at 60 bpm.  Does not have atrial lead so pacing asynchronously. Currently pacing only rarely.    15. Hypothyroidism: May be related to amiodarone, he is on Levoxyl. No change.    16. ID: See above - Meropenem stopped 8/24 . No  change.  18. Severe Deconditioning: Continue PT. Hopefully to CIR.  ?Tomorrow if he does ok today.  19. Hypernatremia: sodium lower at 153. Continue free water boluses 400 q4 hrs.  I reviewed the HVAD parameters from today, and compared the results to the patient's prior recorded data.  No programming changes were made.  The HVAD is functioning within specified parameters.    Loralie Champagne, MD 06/18/2017, 8:29 AM  VAD Team --- VAD ISSUES ONLY--- Pager [phone] (7am - 7am) Advanced Heart Failure Team  Pager [phone] (M-F; 7a - 4p)  Please contact [facility] for night-coverage after hours (4p -7a ) and weekends on [email or web address]

## 2017-06-18 NOTE — Progress Notes (Signed)
  Speech Language Pathology Treatment: Dysphagia  Patient Details Name: James [name] MRN: [id number] DOB: 1961-07-02 Today's Date: 06/18/2017 Time: [id number] SLP Time Calculation (min) (ACUTE ONLY): 13 min  Assessment / Plan / Recommendation Clinical Impression  Pt more alert and participatory today.  Requesting ice chips.  Oral care provided; pt consumed limited ice chips with adequate manipulation, occasional mild cough post-swallow.  Completed four sets of four trials of expiratory muscle training (set at resistance of 15).  Pt describes moderate effort for all trials. Recommend continued ice chips after oral care, RMT with SLP.  Swallow function remains quite weak - agree that CIR would maximize recovery process.   HPI HPI: 56 year old male admitted 04/05/2017 for LVAD placement. PMH significant for gout, DM2, HTN, CKD, CAD s/p CABGx6 (2012), systemic heart failure, St. Jude ICD. 8/7 with decreased MS, confusion.  Head CT with right superior cerebellar artery lacunar infarcts, not acute.  Pt has had 4 intubations this admission.  The first three intubations were for 24 hour periods; the last 8/8-8/22.        SLP Plan  Continue with current plan of care       Recommendations  Diet recommendations: NPO Medication Administration: Via alternative means                Oral Care Recommendations: Oral care prior to ice chip/H20 SLP Visit Diagnosis: Dysphagia, pharyngeal phase (R13.13);Dysphagia, pharyngoesophageal phase (R13.14) Plan: Continue with current plan of care       GO                Blenda MountsCouture, Finnlee Guarnieri Laurice 06/18/2017, 1:34 PM

## 2017-06-18 NOTE — Progress Notes (Signed)
ANTICOAGULATION CONSULT NOTE - Follow Up Consult  Pharmacy Consult for Heparin  + Coumadin Indication: HVAD  No Known Allergies  Patient Measurements: Height: 6' (182.9 cm) Weight: 232 lb 5.8 oz (105.4 kg) IBW/kg (Calculated) : 77.6  Vital Signs: Temp: 99.2 F (37.3 C) (08/29 1215) Temp Source: Axillary (08/29 1215) BP: 101/74 (08/29 1300) Pulse Rate: 76 (08/29 1300)  Labs:  Recent Labs  06/16/17 0328 06/17/17 0232 06/18/17 0400 06/18/17 0452  HGB 9.9* 9.4* 8.7*  --   HCT 31.9* 30.8* 27.4*  --   PLT 256 218 174  --   LABPROT 17.1* 17.9*  --  17.1*  INR 1.39 1.46  --  1.41  HEPARINUNFRC 0.42 0.33  --  0.27*  CREATININE 1.25* 1.23 1.17  --     Estimated Creatinine Clearance: 90.6 mL/min (by C-G formula based on SCr of 1.17 mg/dL).   Medications: Heparin @ 1600 units/hr  Assessment: 56 y/o M continues on heparin s/p HVAD on 04/19/2017. Heparin level slightly below goal today at 0.27.   Pharmacy asked to resume coumadin 8/22.  Has been very sensitive to coumadin earlier this admission. We have been dosing conservatively, however, INR not moving much and remains below goal at 1.4. Amiodarone dose decrease from 200 bid to 200 daily on 8/27 and he has been getting tube feeds which contain vitamin k so he may just require more coumadin now.   No overt bleeding. Noted drop in Hgb 9.4 > 8.7, LDH ok.   Goal of Therapy:  Heparin level 0.3-0.5 units/mL INR 2-2.5 Monitor platelets by anticoagulation protocol: Yes   Plan:  1) Increase heparin to 1700 units/hr 2) Increase coumadin to 5mg  tonight 3) Daily heparin level, CBC, INR  Louie CasaJennifer Monicka Cyran, PharmD, BCPS 06/18/2017 1:25 PM

## 2017-06-18 NOTE — Progress Notes (Signed)
Physical Therapy Wound Treatment Patient Details  Name: James [name] MRN: [id number] Date of Birth: 1961/01/27  Today's Date: 06/18/2017 Time: 0910-1010 Time Calculation (min): 60 min  Subjective  Subjective: Agreeable Patient and Family Stated Goals: healed up so I can do well with therapy  Pain Score:  Premedicated  Wound Assessment  Pressure Injury 05/14/17 Unstageable - Full thickness tissue loss in which the base of the ulcer is covered by slough (yellow, tan, gray, green or brown) and/or eschar (tan, brown or black) in the wound bed. WOC updated, wound evolution to unstageable  (Active)  Dressing Type Moist to dry;Gauze (Comment) 06/18/2017 10:54 AM  Dressing Clean;Dry;Intact 06/18/2017 10:54 AM  Dressing Change Frequency Daily 06/18/2017 10:54 AM  State of Healing Eschar 06/18/2017 10:54 AM  Site / Wound Assessment Red;Pink;Yellow;Brown 06/18/2017 10:54 AM  % Wound base Red or Granulating 25% 06/18/2017 10:54 AM  % Wound base Yellow/Fibrinous Exudate 75% 06/18/2017 10:54 AM  % Wound base Black/Eschar 0% 06/18/2017 10:54 AM  % Wound base Other/Granulation Tissue (Comment) 0% 06/18/2017 10:54 AM  Peri-wound Assessment Intact;Erythema (blanchable) 06/18/2017 10:54 AM  Wound Length (cm) 6 cm 06/16/2017  8:00 PM  Wound Width (cm) 7 cm 06/16/2017  8:00 PM  Wound Depth (cm) 9 cm 06/16/2017  8:00 PM  Tunneling (cm) 0 06/16/2017  8:00 PM  Undermining (cm) 2cm 9:00-2:00, 1cm 2:00-7:00, 3cm 7:00-9:00 06/16/2017  8:00 PM  Margins Unattached edges (unapproximated) 06/18/2017 10:54 AM  Drainage Amount Moderate 06/18/2017 10:54 AM  Drainage Description Serosanguineous;Purulent;No odor 06/18/2017 10:54 AM  Treatment Debridement (Selective);Hydrotherapy (Pulse lavage);Packing (Saline gauze) 06/18/2017 10:54 AM   Santyl applied to wound bed prior to applying dressing.    Hydrotherapy Pulsed lavage therapy - wound location: sacral Pulsed Lavage with Suction (psi): 12 psi Pulsed Lavage with Suction - Normal  Saline Used: 1000 mL Pulsed Lavage Tip: Tip with splash shield Selective Debridement Selective Debridement - Location: sacral Selective Debridement - Tools Used: Forceps;Scissors;Scalpel Selective Debridement - Tissue Removed: Yellow and brown necrotic tissue   Wound Assessment and Plan  Wound Therapy - Assess/Plan/Recommendations Wound Therapy - Clinical Statement: pt will benefit from hydro for the pulsed lavage that will help cleansed wound, decrease bacterial load and otherwise prep wound for selective debridement. Wound Therapy - Functional Problem List: Relative immobility, decreased strength, compromised nutrition and skin integrity.  See PT notes Factors Delaying/Impairing Wound Healing: Incontinence;Infection - systemic/local;Immobility;Multiple medical problems Hydrotherapy Plan: Debridement;Dressing change;Patient/family education;Pulsatile lavage with suction Wound Therapy - Frequency: 6X / week Wound Therapy - Current Recommendations: PT;Case manager/social work;WOC nurse Wound Therapy - Follow Up Recommendations: Home health RN;Skilled nursing facility Wound Plan: see above  Wound Therapy Goals- Improve the function of patient's integumentary system by progressing the wound(s) through the phases of wound healing (inflammation - proliferation - remodeling) by: Decrease Necrotic Tissue to: 25 % Decrease Necrotic Tissue - Progress: Progressing toward goal Increase Granulation Tissue to: 75% including any viable tissues Increase Granulation Tissue - Progress: Progressing toward goal Improve Drainage Characteristics: Min;Serous Improve Drainage Characteristics - Progress: Progressing toward goal Goals/treatment plan/discharge plan were made with and agreed upon by patient/family: Yes Time For Goal Achievement: 7 days Wound Therapy - Potential for Goals: Good  Goals will be updated until maximal potential achieved or discharge criteria met.  Discharge criteria: when goals  achieved, discharge from hospital, MD decision/surgical intervention, no progress towards goals, refusal/missing three consecutive treatments without notification or medical reason.  GP     Thelma Comp 06/18/2017, 11:02 AM  Rolinda Roan, PT, DPT Acute Rehabilitation Services Pager: [phone]

## 2017-06-19 DIAGNOSIS — G7281 Critical illness myopathy: Secondary | ICD-10-CM

## 2017-06-19 LAB — CBC
HCT: 28.2 % — ABNORMAL LOW (ref 39.0–52.0)
HEMOGLOBIN: 8.6 g/dL — AB (ref 13.0–17.0)
MCH: 28.5 pg (ref 26.0–34.0)
MCHC: 30.5 g/dL (ref 30.0–36.0)
MCV: 93.4 fL (ref 78.0–100.0)
PLATELETS: 143 10*3/uL — AB (ref 150–400)
RBC: 3.02 MIL/uL — ABNORMAL LOW (ref 4.22–5.81)
RDW: 21.1 % — ABNORMAL HIGH (ref 11.5–15.5)
WBC: 8.9 10*3/uL (ref 4.0–10.5)

## 2017-06-19 LAB — BASIC METABOLIC PANEL
ANION GAP: 5 (ref 5–15)
BUN: 51 mg/dL — ABNORMAL HIGH (ref 6–20)
CALCIUM: 8.1 mg/dL — AB (ref 8.9–10.3)
CO2: 24 mmol/L (ref 22–32)
CREATININE: 1.22 mg/dL (ref 0.61–1.24)
Chloride: 124 mmol/L — ABNORMAL HIGH (ref 101–111)
Glucose, Bld: 139 mg/dL — ABNORMAL HIGH (ref 65–99)
Potassium: 3.2 mmol/L — ABNORMAL LOW (ref 3.5–5.1)
SODIUM: 153 mmol/L — AB (ref 135–145)

## 2017-06-19 LAB — GLUCOSE, CAPILLARY
GLUCOSE-CAPILLARY: 121 mg/dL — AB (ref 65–99)
GLUCOSE-CAPILLARY: 128 mg/dL — AB (ref 65–99)
GLUCOSE-CAPILLARY: 131 mg/dL — AB (ref 65–99)
GLUCOSE-CAPILLARY: 141 mg/dL — AB (ref 65–99)
Glucose-Capillary: 139 mg/dL — ABNORMAL HIGH (ref 65–99)
Glucose-Capillary: 152 mg/dL — ABNORMAL HIGH (ref 65–99)

## 2017-06-19 LAB — LACTATE DEHYDROGENASE: LDH: 187 U/L (ref 98–192)

## 2017-06-19 LAB — APTT
APTT: 93 s — AB (ref 24–36)
aPTT: 126 seconds — ABNORMAL HIGH (ref 24–36)

## 2017-06-19 LAB — PROTIME-INR
INR: 1.52
PROTHROMBIN TIME: 18.2 s — AB (ref 11.4–15.2)

## 2017-06-19 LAB — HEPARIN LEVEL (UNFRACTIONATED): HEPARIN UNFRACTIONATED: 0.37 [IU]/mL (ref 0.30–0.70)

## 2017-06-19 MED ORDER — DOXAZOSIN MESYLATE 4 MG PO TABS
4.0000 mg | ORAL_TABLET | Freq: Every day | ORAL | Status: DC
  Administered 2017-06-19: 4 mg via ORAL
  Filled 2017-06-19 (×2): qty 1

## 2017-06-19 MED ORDER — POTASSIUM CHLORIDE 20 MEQ/15ML (10%) PO SOLN
40.0000 meq | Freq: Once | ORAL | Status: AC
  Administered 2017-06-19: 40 meq
  Filled 2017-06-19: qty 30

## 2017-06-19 MED ORDER — SODIUM CHLORIDE 0.9 % IV SOLN
0.1500 mg/kg/h | INTRAVENOUS | Status: DC
  Administered 2017-06-19: 0.15 mg/kg/h via INTRAVENOUS
  Filled 2017-06-19 (×2): qty 250

## 2017-06-19 MED ORDER — SODIUM CHLORIDE 0.9 % IV SOLN
0.0600 mg/kg/h | INTRAVENOUS | Status: DC
  Filled 2017-06-19: qty 250

## 2017-06-19 MED ORDER — WARFARIN SODIUM 7.5 MG PO TABS
7.5000 mg | ORAL_TABLET | Freq: Once | ORAL | Status: AC
  Administered 2017-06-19: 7.5 mg via ORAL
  Filled 2017-06-19: qty 1

## 2017-06-19 MED ORDER — SODIUM CHLORIDE 0.9 % IV SOLN
0.1200 mg/kg/h | INTRAVENOUS | Status: DC
  Filled 2017-06-19: qty 250

## 2017-06-19 NOTE — Consult Note (Signed)
Physical medicine and rehabilitation consult follow-up  Please see initial consult dated 04/28/2017  Age 56-year-old male with past history of congestive heart failure, coronary artery disease status post CABG and ICD placement. He has history of anemia. LVAD placed 03/23/2017. Postprocedure, A. fib, RVR. Patient had a decline in function 05/27/2017, suspected aspiration event on 8 8 requiring intubation. CT of the head was negative for any type of CVA. A 08/09/2017 with possible seizure. Extubation on 06/12/2017. PT, OT and speech have been restarted. He is nothing by mouth and noted to have poor respiratory reserve by speech language pathologist. Physical therapy restarted max assist times 2 with transfers. Difficulty tolerating tilt table. Wound nurse, continuing with sacral dressings, hydrotherapy. Albumin 1.8 on 06/07/2017  Examination reveals a cachectic male with PICC line as well as feeding tube  Speech is dysarthric Lungs decreased breath sounds at bases. No upper airway congestion Heart LVAD hum Abdomen positive bowel sounds, nontender Motor strength is 2 minus bilateral deltoid, 3 minus bicep, tricep 4 minus, finger flexors, extensors 2 minus bilateral hip flexors, 2 minus, knee extensors 3 minus, ankle dorsiflexors Sensation intact to light touch lateral upper and lower extremities  Impression 1. 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   Patient is unable to tolerate aggressive rehabilitation. Poor prognosis for functional improvement in a comprehensive intensive inpatient rehabilitation setting. Would rec LTAC  We'll sign off

## 2017-06-19 NOTE — Progress Notes (Addendum)
Inpatient Rehabilitation  Please refer to Dr. Wynn BankerKirsteins' note from earlier this afternoon, with recommendations for a lower level of intensity for post acute therapies.  Dr. Wynn BankerKirsteins discussed with patient and I have called and updated patient's wife, Elease HashimotoMarcie via phone.  Updated case manager as will.  Will sign off at this time.  Please call if questions.   Charlane FerrettiMelissa Trude Cansler, M.A., CCC/SLP Admission Coordinator  [facility]  Cell [phone]

## 2017-06-19 NOTE — Progress Notes (Signed)
CSW met with patient and mother at bedside. Patient acknowledged CSW with a wave but exhausted from therapy session earlier. Patient was pleased with his ability to participate in therapy today. Patient's mother shared that Rehab MD rounded a few minutes ago and after assessing patient shared with her that he did not think patient was strong enough for rehab program. Patient;s mother very concerned about "where will he go". Mother states he is making progress since extubation last week. CSW provided supportive intervention and words of encouragement to mother and assured her follow up with the VAD team regarding plan for rehab. Patient's mother appears stressed and concerned about the future plan for patient as she fears he will be sent to [facility]. Mother states family had a "bad experience at [place] with my brother" and requesting that he not be sent there for rehab. CSW will continue to be available to support family and assist with plan as needed. Raquel Sarna, [place], [place]

## 2017-06-19 NOTE — Progress Notes (Addendum)
LVAD Inpatient Coordinator Rounding Note:  Admitted 03/31/2017 due to A/C Heart failure.   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 Laneta SimmersBartle as DT VAD.  Transferred to 4E 05/17/17.  Transferred back to ICU 2H09 on 05/28/17 with possible aspiration pneumonia requiring re-intubation.   Vital signs: Temp: 98.2 HR: 77 Doppler: 115 Auto BP: 112/84 (92) O2 Sat: 99% on 2 LNC Wt in lbs: Marland Kitchen... 217>223>224>224>234>240>245>235>234>236>239>234>232>232>232>233  LVAD interrogation reveals:  Speed: 2540 Flow: 3.4 Power: 3.5w Alarms: none Peak: 6.5 Trough: 1.3 HCT:  28 Low flow alarm setting: 1.7 High watt alarm setting: 5.5  Suction: OFF  Lavare cycle: on  Blood Products: 6/28> 5 PRBC's, 6 FFP 7/1> 1 PRBC 7/9> 1 PRBC 7/13>3 PRBC 7/14>3 PRBC 7/15> 1 PRBC 7/16>3PRBC 7/17> 3 PRBC 7/20> 1 PRBC 8/6> 1 PRBC 8/10 > 1 PRBC 8/22 > 1 PRBC  Gtts: Angiomax per pharmacy  GI: 04/23/17 developed ileus with respiratory compromise - NG with 3 L suctioned out 05/03/17 - melena due to acute GI bleed -- PCs given as needed 05/08/2017 - 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  Neuro: 05/27/17- Neurology consult for lethargy, mouth tremors, not vocal, not following commands> Head CT and EEG 05/28/17 - Continual EEG monitoring  05/30/17 - started Depakote for seizure activity - stopped 06/03/17 for rash - will start Keppra   Arrhythmia: 04/24/17 - Afib with RVR - started amiodarone 05/02/17 - 2 sustained episodes of VT - cardioverted x 1; second broke with overdrive pacing 7/17- wide complex tachycardia- Amio bolus x3 and gtt at 60 mg/hr 05/07/17 - sustained VT - converted with overdrive pacing; Lidocaine started in addition to amiodarone 05/08/17 - Lidocaine stopped due to confusion (lido level 12.4) 7/22 - Sustained VT with rate 130 after getting IV lasix. Pace terminated, back on IV amiodarone 05/14/17 - sustained VT - converted 3 separate  occassions with overdrive pacing 05/19/17 - sustained VT-converted with overdrive pacing  8/1 - VT pace terminated 8/2 - VT started Sotalol 05/26/17- Afib rate in the 50's 05/27/17 - NSR   Respiratory: 04/18/17 - Extubated 04/23/17 - re-intubated due to respiratory failure secondary to suspected aspiration pneumonia 04/25/17-extubated 05/01/2017- intubated for EGD 05/18/2017- extubated 05/28/17 - re-intubated for suspected aspiration pneumonia 06/11/17 - one way extubation  Drive Line: Daily Dressing Kits with Aquacell AG silver strips per protocol -will advance to every other day dressing changes. Due today.  Labs:  LDH trend: .Marland Kitchen...[phone] [id number]>156>156>146>154>188>142>138>127>128>148>164>175>170>187  INR trend: .... 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 He had epinephrine, APC, and 2 clips.  - 7/19 - Lidocaine gtt stopped due to confusion (lido toxicity) - 8/7- Neurology consult- lethargy, mouth tremors, not vocal, not following commands>Head CT and EEG, blood and urine cultures sent, Ammonia-20 -8/8 - re-intubated for possible aspiration pneumonia   Plan/Recommendations:  1. Currently every other day dressing changes. Due 8/30. 2. Please page VAD pager for any equipment or patient needs 3. Plan for CIR evaluation   Marcellus ScottLesley Wilson RN, VAD Coordinator 24/7 pager [phone]

## 2017-06-19 NOTE — Progress Notes (Addendum)
ANTICOAGULATION CONSULT NOTE - Follow Up Consult  Pharmacy Consult for Heparin >> Bivalirudin + Coumadin Indication: HVAD  Allergies  Allergen Reactions  . 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    Patient Measurements: Height: 6' (182.9 cm) Weight: 233 lb 11 oz (106 kg) IBW/kg (Calculated) : 77.6  Vital Signs: Temp: 98.2 F (36.8 C) (08/30 0817) Temp Source: Oral (08/30 0817) BP: 111/77 (08/30 0800) Pulse Rate: 79 (08/30 0800)  Labs:  Recent Labs  06/17/17 0232 06/18/17 0400 06/18/17 0452 06/19/17 0228 06/19/17 0253  HGB 9.4* 8.7*  --   --  8.6*  HCT 30.8* 27.4*  --   --  28.2*  PLT 218 174  --   --  143*  LABPROT 17.9*  --  17.1*  --  18.2*  INR 1.46  --  1.41  --  1.52  HEPARINUNFRC 0.33  --  0.27* 0.37  --   CREATININE 1.23 1.17  --   --  1.22    Estimated Creatinine Clearance: 87.1 mL/min (by C-G formula based on SCr of 1.22 mg/dL).   Medications: Heparin @ 1700 units/hr  Assessment: 56 y/o M continues on heparin s/p HVAD on 04/11/2017. Platelets are down to 143 (~50% drop over last 5 days) so will stop heparin, send HIT assay, and switch to bivalirudin.  Pharmacy asked to resume coumadin 8/22.  Has been very sensitive to coumadin earlier this admission. We have been dosing conservatively, however, INR not moving much and remains below goal at 1.52. Amiodarone dose decreased from 200 bid to 200 daily on 8/27 and he has been getting tube feeds which contain vitamin k so he may just require more coumadin now. Will need to aim for a higher INR goal while on bivalirudin.   No overt bleeding. Hgb low but stable. LDH ok.   Goal of Therapy:  APTT 50-85 seconds INR 2-2.5, 3-3.5 on bivalirudin Monitor platelets by anticoagulation protocol: Yes   Plan:  1) Stop heparin 2) Start bivalirudin 0.15mg /kg/hr 3) 2 hour APTT 4) APTT every 4 hours until therapeutic x 2 then daily 5) Increase coumadin to 7.5mg  tonight 6) Daily INR, CBC, LDH  Louie CasaJennifer Tejas [place]wood, PharmD,  BCPS 06/19/2017 8:28 AM    ADDENDUM: Initial APTT is supratherapeutic at 93 seconds. Decrease bivalirudin to 0.12mg /kg/hr and check another APTT in 4 hours.  Louie CasaJennifer Lamere Lightner, PharmD, BCPS 06/19/2017, 2:14 PM

## 2017-06-19 NOTE — Progress Notes (Signed)
Patient ID: James [name], male   DOB: 11-06-1960, 56 y.o.   MRN: [id number]   Advanced Heart Failure VAD Team Note  Subjective:    Events: --HVAD placed 6/28.  Returned to the OR that evening with high chest tube output, evacuation of mediastinal hematoma.  --Extubated 6/29. Milrinone stopped on 7/4. --7/4, patient developed ileus with respiratory compromise. NGT placed with 3 L suctioned out.  CXR with suspicion for aspiration PNA.  Patient had to be intubated.  He went into atrial fibrillation with RVR.  He became hypotensive and was started on norepinephrine and phenylephrine.  Amiodarone gtt begun.    --Extubated again on 7/6.  -- 7/13 Had 2 sustained episodes of VT. Had to be cardioverted 1. Second episode broke with overdrive pacing with Dr. Caryl Comes. VAD speed turned down to 2700.  --Melena due to acuteGI bleed --> 2 units PRBCs 7/14, 2 units 7/15. 3 U PRBCs 7/16,  04/28/2017 3 UPRBCs.  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  He had epinephrine, APC, and 2 clips. Intubated prior to procedure and placed on norepinephrine. Speed dropped to 2660.  --7/18 Back in VT with overdrive pacing. Lidocaine was started in addition to amiodarone. Extubated. --7/19 lidocaine stopped with confusion. Neuro consulted.  EEG normal.  CT of head no acute findings. 7/20 confusion had resolved.  --1 unit PRBCs 7/20.  --7/22, developed sustained VT with rate around 130 shortly after getting IV Lasix.  We were able to pace him out and back to NSR.  He was put back on amiodarone gtt.  --More VT on 7/25, paced out.  Back on IV amiodarone gtt.  NSR.  -- 7/31 Ramp ECHO decrease speed 2600 rpm.  -- 8/1 VT- paced out -- 8/2 VT - started on sotalol -- 8/6 Hgb 7.5, 1 unit PRBCs -- 8/7 Hgb 9.5, confused.  Head CT with right superior cerebellar artery lacunar infarcts, not acute. -- 8/8 Possible aspiration. Reintubated. -- 8/10 possible seizure => Depakote.  CT head no  change.  1 unit PRBCs.  -- Multiple suction alarms on 8/17 night which persisted so suction alarm deactivated.  -- Extubated 8/23  MAP better today 80s-90s.  He is alert today, converses.  Worked with PT and OT yesterday.   Remains on heparin gtt + warfarin.  INR 1.5.  Plts have fallen about 50% over the last few days.   Getting hydrotherapy.   Weight stable. CVP remains 6-7.   Sodium 155 => 153 => 153  HVAD INTERROGATION:  HVAD:  Flow 3.8 liters/min, speed 2450,  power 3.4 W,  Peak 6.5 Trough 1.5. Suction alarms off. Lavare on.   Objective:    Vital Signs:   Temp:  [97.7 F (36.5 C)-99.6 F (37.6 C)] 97.9 F (36.6 C) (08/30 0400) Pulse Rate:  [60-79] 74 (08/30 0700) Resp:  [21-33] 33 (08/30 0700) BP: (95-137)/(71-88) 118/75 (08/30 0700) SpO2:  [97 %-100 %] 100 % (08/30 0700) Weight:  [233 lb 11 oz (106 kg)] 233 lb 11 oz (106 kg) (08/30 0500) Last BM Date: 06/18/17 Mean arterial Pressure 80s-90s Intake/Output:   Intake/Output Summary (Last 24 hours) at 06/19/17 0811 Last data filed at 06/19/17 0700  Gross per 24 hour  Intake             3310 ml  Output             1395 ml  Net  1915 ml     Physical Exam    Physical Exam: CVP 7 GENERAL: Fatigued and chronically ill appearing. NAD.  HEENT: Normal. NECK: Supple, JVP 7-8 cm. Carotids OK.  CARDIAC: Mechanical heart sounds with LVAD hum present.  LUNGS: CTAB, normal effort.  ABDOMEN: NT, ND, no HSM. No bruits or masses. +BS LVAD exit site: Dressing dry and intact. No erythema or drainage. Stabilization device present and accurately applied. Driveline dressing changed daily per sterile technique. EXTREMITIES: Warm and dry. No cyanosis or clubbing. Diffuse gouty abnormalities. LEs wrapped. 1-2+ edema into thighs.  NEUROLOGIC: Alert &oriented x 3. Cranial nerves grossly intact. Moves all 4 extremities w/o difficulty. Affect flat but appropriate.    Telemetry   Personally reviewed, NSR 60s-70s    Labs   Basic Metabolic Panel:  Recent Labs Lab 06/14/17 0253 06/16/17 0328 06/17/17 0232 06/18/17 0400 06/19/17 0253  NA 154* 154* 155* 153* 153*  K 3.9 3.6 3.4* 3.2* 3.2*  CL 123* 123* 123* 122* 124*  CO2 _0 GLUCOSE 129* 144* 149* 163* 139*  BUN 58* 54* 52* 48* 51*  CREATININE 1.43* 1.25* 1.23 1.17 1.22  CALCIUM 8.4* 8.4* 8.4* 8.0* 8.1*  MG  --   --   --  1.6*  --     Liver Function Tests: No results for input(s): AST, ALT, ALKPHOS, BILITOT, PROT, ALBUMIN in the last 168 hours. No results for input(s): LIPASE, AMYLASE in the last 168 hours. No results for input(s): AMMONIA in the last 168 hours.  CBC:  Recent Labs Lab 06/14/17 0253 06/16/17 0328 06/17/17 0232 06/18/17 0400 06/19/17 0253  WBC 11.1* 10.9* 10.0 8.4 8.9  HGB 10.9* 9.9* 9.4* 8.7* 8.6*  HCT 35.4* 31.9* 30.8* 27.4* 28.2*  MCV 91.9 93.0 93.1 92.6 93.4  PLT 278 256 218 174 143*    INR:  Recent Labs Lab 06/15/17 0426 06/16/17 0328 06/17/17 0232 06/18/17 0452 06/19/17 0253  INR 1.37 1.39 1.46 1.41 1.52   Other results:    Imaging   No results found.  Medications:    Scheduled Medications: . amiodarone  200 mg Per Tube Daily  . amLODipine  10 mg Oral BID  . chlorhexidine gluconate (MEDLINE KIT)  15 mL Mouth Rinse BID  . Chlorhexidine Gluconate Cloth  6 each Topical q morning - 10a  . collagenase   Topical Daily  . doxazosin  4 mg Oral Daily  . febuxostat  120 mg Oral Daily  . feeding supplement (NEPRO CARB STEADY)  1,000 mL Per Tube Q24H  . feeding supplement (PRO-STAT SUGAR FREE 64)  60 mL Per Tube BID  . free water  400 mL Per Tube Q4H  . hydrALAZINE  100 mg Oral Q8H  . insulin aspart  0-15 Units Subcutaneous Q4H  . levETIRAcetam  500 mg Per Tube BID  . levothyroxine  50 mcg Per NG tube QAC breakfast  . magnesium oxide  400 mg Per Tube Daily  . mouth rinse  15 mL Mouth Rinse Q4H  . mexiletine  300 mg Per Tube Q12H  . pantoprazole sodium  40 mg Per Tube BID  .  predniSONE  5 mg Oral Q breakfast  . sodium chloride flush  10-40 mL Intracatheter Q12H  . sotalol  80 mg Per Tube Daily  . spironolactone  25 mg Oral Daily  . Warfarin - Pharmacist Dosing Inpatient   Does not apply q1800    Infusions: . sodium chloride 10 mL/hr at 06/19/17 0700  .  heparin 1,700 Units/hr (06/19/17 0700)    PRN Medications: acetaminophen (TYLENOL) oral liquid 160 mg/5 mL, albuterol, docusate, fentaNYL (SUBLIMAZE) injection, Gerhardt's butt cream, hydrALAZINE, midazolam, morphine injection, morphine, neomycin-bacitracin-polymyxin, ondansetron (ZOFRAN) IV, oxyCODONE, pneumococcal 23 valent vaccine, silver nitrate applicators, sodium chloride flush   Patient Profile   56 yo with CAD s/p CABG, ischemic cardiomyopathy/chronic systolic CHF, tophaceous gout, and CKD stage 3 was admitted for diuresis and consideration for LVAD placement. S/p HVAD on 6/28  Assessment/Plan:    1. Acute/chronic systolic CHF s/p HVAD: Ischemic cardiomyopathy.  [employer] Jude ICD.  Echo (6/18) with EF 15%, mildly dilated RV with moderately decreased systolic function.  s/p HVAD placement 6/28 and had to return to OR to evacuate mediastinal hematoma.  He had been weaned off pressors/milrinone, but developed ileus w/ likely aspiration event 7/4, re-intubated, developed afib/RVR requiring norepinephrine. Extubated 7/6.  VAD speed turned down to 2540 with VT. VAD parameters currently look good.  Milrinone stopped 7/17 with recurrent VT.  He was started back on norepinephrine 8/9 due to soft BP after intubation and suspected septic shock, now off norepinephrine.  MAP improved but still in 90s at times, getting hydralazine per tube as well as amlodipine, doxazosin, and spironolactone.  Wide peak/trough may be related to HTN. Volume status fine centrally with CVP 6-7. Main issue is massive 3rd spacing but low intravascular volume.  Legs now wrapped and weight stable. - Can hold off on Lasix today especially with Na still  high.  - Wrap legs to knees.  - MAP continues to run quite high but improving.  Increase doxazosin to 4 mg daily.    - Off ASA with GI Bleed  - Goal INR 2-2.5. INR 1.5. Platelets have fallen 50% over the last few days.  Will replace heparin gtt with bivalirudin and send HIT assay.  Stop bivalirudin when INR therapeutic on warfarin.  - VAD parameters stable except low trough still. Suspect this is 2/2 HTN. Flow better today with BP control.  2. AKI on CKD stage 3: Creatinine stable.  3. Symptomatic anemia due to acute UGI bleeding:  Received 3 units PRBCs 7/16 and 3 units PRBCs on 04/29/2017. 1 unit PRBCs 7/20. 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  Requiring 2 clips + epi + APC. 1 unit PRBCs 8/6.  He had 1 unit PRBCs 8/10 to provide volume.  1 unit PRBCs on 8/22.  No overt bleeding.   - Got Feraheme 7/28.  - Continue Protonix 40 po bid. - Transfuse hgb < 8.  4. Ventricular tachycardia: Status post ventricular tachycardia 2 on 7/13. Possible suction event. VAD speed turned down to 2700.  Recurrent VT 7/17. EP called to bedside. Overdrive pacing successful for about 10 minutes but VT recurred.  Milrinone turned off, remained in VT overnight but hemodynamically stable.  Speed decreased to 2600.  On 7/18, we were able to pace him out of VT.  Lidocaine added then stopped due to confusion/mental status changes.  He had VT 7/22 about 30 minutes after getting IV Lasix, had to be paced out again => may have been due to rapid fluid shift.  Pt paced out of VT 3 separate occasions 7/25. Got amio bolus x 2 and started back on IV infusion.  Transitioned to amiodarone 400 mg tid.  On 8/1 had recurrent VT and paced out again.  Recurrent VT 8/2 per EP sotalol started along with amiodarone, sotalol decreased to once daily with bradycardia. Remains quiescent.  -  Continue mexiletine to 300 mg BID, sotalol 80 mg daily.  - Continue amiodarone 200 mg daily.  - Keep K > 4.0 Mg > 2.0 =>  give Mg today.  5. Paroxysmal Atrial fibrillation:  Developed atrial fibrillation with RVR in setting of aspiration PNA on 7/4.  In NSR currently on triple AA therapy. No change.  6. Malnutrition:  Continue tube feeds via Cor-track. Nutrition following. No change.  7. Aspiration PNA with acute hypoxemic respiratory failure on 7/4: He was extubated on 7/6. Tracheal aspirate with Klebsiella and citrobacter. Both sensitive to Zosyn. Completed abx 7/13. Possible aspiration 8/8. Treated with meropenem. Stopped 8/24 - Off abx. No change.  - He remains NPO, everything via Cor-track.  May end up needing PEG.  8. Acute Respiratory Failure: Intubated 7/17 in setting of complex GI intervention. Extubated 7/18. Re-intubated 8/8.  Weak respiratory muscles. He has not wanted a tracheostomy.  Extubated on 8/22.   - Remains stable off vent.  9. CAD: s/p CABG 2012. No chest pain.  Off aspirin due to GI bleeding. No change.  10. Gout: Severe tophaceous gout.  - Continue uloric. Stopped colchicine with diarrhea. Flare now resolved. Will stop prednisone after tomorrow's dose.   11. Delirium: Appreciate neuro input Possibly related to lidocaine.  Lidocaine stopped. CT of head negative. EEG ok. No further workup per neuro. Neuro  reconsulted 8/7 with AMS => ?related to fever/infection.  He has been more clear on vent.  CT head 05/27/17 and 8/10 with lacunar infarcts but do not appear acute.  Seizure activity versus myoclonus => Depakote started.  Switched to [place] with ?drug rash from Depakote.   - Currently resolved. 12. UTI: on vancomycin and meropenum. Urine culture with no growth - Meropenem stopped 8/24. No change.  13. Unstageable Pressure Ulcer: Sacrum. Consult WOC - Needs to start santyl daily to enzymatically debride. Reposition R to L.  - Continue with hydrotherapy. No change. Appreciate WOC care.  14. Bradycardia: With hypotension.  Currently back-up pacing set at 60 bpm.  Does not have atrial lead so pacing  asynchronously. Currently pacing only rarely.    15. Hypothyroidism: May be related to amiodarone, he is on Levoxyl. No change.    16. ID: See above - Meropenem stopped 8/24 . No change.  18. Severe Deconditioning: Continue PT. Hopefully to CIR => needs evaluation by rehab MD today, think he can go there soon (?today, tomorrow).  19. Hypernatremia: sodium 153, slow down trend. Continue free water boluses 400 q4 hrs. 20. Thrombocytopenia: Mild but plts down 50% over the last few days.  Send HIT Ab and change to bivalirudin.   I reviewed the HVAD parameters from today, and compared the results to the patient's prior recorded data.  No programming changes were made.  The HVAD is functioning within specified parameters.    Loralie Champagne, MD 06/19/2017, 8:11 AM  VAD Team --- VAD ISSUES ONLY--- Pager [phone] (7am - 7am) Advanced Heart Failure Team  Pager [phone] (M-F; 7a - 4p)  Please contact [facility] for night-coverage after hours (4p -7a ) and weekends on [email or web address]

## 2017-06-19 NOTE — Progress Notes (Signed)
Physical Therapy Wound Treatment Patient Details  Name: James [name] MRN: [id number] Date of Birth: [birth year]  Today's Date: 06/19/2017 Time: 0830-0930 Time Calculation (min): 60 min  Subjective  Subjective: Agreeable to hydrotherapy. Asking for pain meds prior to start. Patient and Family Stated Goals: healed up so I can do well with therapy  Pain Score: Pt premedicated. Reports some pain during treatment.   Wound Assessment  Pressure Injury 05/14/17 Unstageable - Full thickness tissue loss in which the base of the ulcer is covered by slough (yellow, tan, gray, green or brown) and/or eschar (tan, brown or black) in the wound bed. WOC updated, wound evolution to unstageable  (Active)  Dressing Type Moist to dry;Gauze (Comment) 06/19/2017 11:11 AM  Dressing Changed 06/19/2017 11:11 AM  Dressing Change Frequency Daily 06/19/2017 11:11 AM  State of Healing Eschar 06/19/2017 11:11 AM  Site / Wound Assessment Pink;Yellow 06/19/2017 11:11 AM  % Wound base Red or Granulating 25% 06/19/2017 11:11 AM  % Wound base Yellow/Fibrinous Exudate 75% 06/19/2017 11:11 AM  % Wound base Black/Eschar 0% 06/19/2017 11:11 AM  % Wound base Other/Granulation Tissue (Comment) 0% 06/19/2017 11:11 AM  Peri-wound Assessment Intact;Erythema (blanchable) 06/19/2017 11:11 AM  Wound Length (cm) 6 cm 06/16/2017  8:00 PM  Wound Width (cm) 7 cm 06/16/2017  8:00 PM  Wound Depth (cm) 9 cm 06/16/2017  8:00 PM  Tunneling (cm) 0 06/16/2017  8:00 PM  Undermining (cm) 2cm 9:00-2:00, 1cm 2:00-7:00, 3cm 7:00-9:00 06/16/2017  8:00 PM  Margins Unattached edges (unapproximated) 06/19/2017 11:11 AM  Drainage Amount Moderate 06/19/2017 11:11 AM  Drainage Description Purulent;Serosanguineous 06/19/2017 11:11 AM  Treatment Debridement (Selective);Hydrotherapy (Pulse lavage);Packing (Saline gauze) 06/19/2017 11:11 AM   Santyl applied to wound bed prior to applying dressing.     Hydrotherapy Pulsed lavage therapy - wound location: sacral Pulsed  Lavage with Suction (psi): 12 psi Pulsed Lavage with Suction - Normal Saline Used: 1000 mL Pulsed Lavage Tip: Tip with splash shield Selective Debridement Selective Debridement - Location: sacral Selective Debridement - Tools Used: Forceps;Scissors Selective Debridement - Tissue Removed: Yellow necrotic tissue   Wound Assessment and Plan  Wound Therapy - Assess/Plan/Recommendations Wound Therapy - Clinical Statement: Wound bed continues to be covered in yellow slough/unviable tissue however overall quality of tissue appears to be improving. Pt will benefit from continued hydrotherapy for the pulsed lavage that will help cleansed wound, decrease bacterial load and otherwise prep wound for selective debridement. Wound Therapy - Functional Problem List: Relative immobility, decreased strength, compromised nutrition and skin integrity.  See PT notes Factors Delaying/Impairing Wound Healing: Incontinence;Infection - systemic/local;Immobility;Multiple medical problems Hydrotherapy Plan: Debridement;Dressing change;Patient/family education;Pulsatile lavage with suction Wound Therapy - Frequency: 6X / week Wound Therapy - Current Recommendations: PT;Case manager/social work;WOC nurse Wound Therapy - Follow Up Recommendations: Home health RN;Skilled nursing facility Wound Plan: see above  Wound Therapy Goals- Improve the function of patient's integumentary system by progressing the wound(s) through the phases of wound healing (inflammation - proliferation - remodeling) by: Decrease Necrotic Tissue to: 25 % Decrease Necrotic Tissue - Progress: Progressing toward goal Increase Granulation Tissue to: 75% including any viable tissues Increase Granulation Tissue - Progress: Progressing toward goal Improve Drainage Characteristics: Min;Serous Improve Drainage Characteristics - Progress: Progressing toward goal Goals/treatment plan/discharge plan were made with and agreed upon by patient/family: Yes Time  For Goal Achievement: 7 days Wound Therapy - Potential for Goals: Good  Goals will be updated until maximal potential achieved or discharge criteria met.  Discharge criteria: when goals achieved,  discharge from hospital, MD decision/surgical intervention, no progress towards goals, refusal/missing three consecutive treatments without notification or medical reason.  GP     Thelma Comp 06/19/2017, 11:58 AM   Rolinda Roan, PT, DPT Acute Rehabilitation Services Pager: [phone]

## 2017-06-19 NOTE — Progress Notes (Signed)
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 Returned to OR later that day for evacuation of mediastinal hematoma. On 7/4 developed ileus with likely aspiration and intubated 7/4-7/6.   Pt developed Heme + stools with progressive anemia as well as  Kirby FunkVtach 05/02/17.  He underwent EGD with showed suspected AVM 05/18/2017. He was intubated for procedure and extubated 05/08/17.   He developed confusion 8/7 - head CT showed small bil. subdural hygromas or hematomas, not significantly changed since 05/08/17; and Rt superior cerebellar lacunar infarct which were new since 7/19, but have a chronic appearance. He developed worsened respirtory status 8/8 requiring intubation; developed gout flare up; and was extubated (one way as pt does not desire trach) on 8/23.   PMH - gout, DMII, HTN, CKD, CAD S/P CABGx6 2012, chronic systolic heart failure and St jude ICD.    PT Comments    Pt admitted with above diagnosis. Pt currently with functional limitations due to the deficits listed below (see PT Problem List). Pt was able to tolerate tilt table 15 minutes today.  Spoke with Rehab coordinator of pts progress and hopeful that pt can go soon. Pt always willing to work hard.   Pt will benefit from skilled PT to increase their independence and safety with mobility to allow discharge to the venue listed below.     Follow Up Recommendations  CIR;Supervision/Assistance - 24 hour     Equipment Recommendations  Other (comment) (TBD next venue)    Recommendations for Other Services Rehab consult     Precautions / Restrictions Precautions Precautions: Fall Precaution Comments: Heartware HVAD Restrictions Weight Bearing Restrictions: No    Mobility  Bed Mobility Overal bed mobility: Needs Assistance Bed Mobility:  Rolling Rolling: Max assist;+2 for physical assistance         General bed mobility comments: Pt rolled Lt and Rt with use of bed rail he is able to assist minimally to place lift pad amd to remove at end of treatment.  Transfers Overall transfer level: Needs assistance               General transfer comment: Pt transferred bed <> tilt table using maxi sky with total A +2   Ambulation/Gait                 Stairs            Wheelchair Mobility    Modified Rankin (Stroke Patients Only)       Balance           Standing balance support:  (strapped into tilt table ) Standing balance-Leahy Scale: Zero Standing balance comment: Pt tolerated standing in tilt table initially at 30*, progressing to 60* with total time up ~15 mins.  Pt denied dizziness.  VSS throughout including LVAD parameters.                            Cognition Arousal/Alertness: Awake/alert Behavior During Therapy: Flat affect Overall Cognitive Status: Impaired/Different from baseline Area of Impairment: Attention;Memory;Following commands;Problem solving                   Current Attention Level: Sustained Memory: Decreased short-term memory;Decreased recall of precautions Following Commands: Follows one step commands consistently Safety/Judgement: Decreased awareness of deficits Awareness: Intellectual Problem  Solving: Slow processing;Decreased initiation;Difficulty sequencing;Requires verbal cues;Requires tactile cues General Comments: Pt very quiet with minimal interactions.  Requires increased time to respond       Exercises General Exercises - Lower Extremity Ankle Circles/Pumps: AROM;10 reps;Both;Supine (resisted movement) Quad Sets: AROM;Both;Supine;10 reps    General Comments        Pertinent Vitals/Pain Pain Assessment: Faces Faces Pain Scale: Hurts little more Pain Location: buttocks/sacrum  Pain Descriptors / Indicators:  Sore;Grimacing;Guarding Pain Intervention(s): Limited activity within patient's tolerance;Monitored during session;Premedicated before session;Repositioned  VSS  Home Living                      Prior Function            PT Goals (current goals can now be found in the care plan section) Progress towards PT goals: Progressing toward goals    Frequency    Min 3X/week      PT Plan Current plan remains appropriate    Co-evaluation              AM-PAC PT "6 Clicks" Daily Activity  Outcome Measure  Difficulty turning over in bed (including adjusting bedclothes, sheets and blankets)?: Unable Difficulty moving from lying on back to sitting on the side of the bed? : Unable Difficulty sitting down on and standing up from a chair with arms (e.g., wheelchair, bedside commode, etc,.)?: Unable Help needed moving to and from a bed to chair (including a wheelchair)?: Total Help needed walking in hospital room?: Total Help needed climbing 3-5 steps with a railing? : Total 6 Click Score: 6    End of Session Equipment Utilized During Treatment: Oxygen (Tilt table) Activity Tolerance: Patient tolerated treatment well Patient left: with call bell/phone within reach;in bed Nurse Communication: Mobility status;Need for lift equipment PT Visit Diagnosis: Muscle weakness (generalized) (M62.81);Difficulty in walking, not elsewhere classified (R26.2);Other abnormalities of gait and mobility (R26.89);Pain Pain - Right/Left: Left Pain - part of body: Knee;Leg;Shoulder;Arm;Hand;Ankle and joints of foot;Hip     Time: 4098-1191 PT Time Calculation (min) (ACUTE ONLY): 62 min  Charges:  $Gait Training: 8-22 mins $Therapeutic Exercise: 8-22 mins $Therapeutic Activity: 23-37 mins                    G Codes:       Aylen Rambert,PT Acute Rehabilitation [phone] (pager)    Amadeo Garnet Alphonza Tramell 06/19/2017, 1:25 PM

## 2017-06-19 NOTE — Progress Notes (Signed)
ANTICOAGULATION CONSULT NOTE - Follow Up Consult  Pharmacy Consult for Bivalirudin Indication: HVAD  Allergies  Allergen Reactions  . 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    Patient Measurements: Height: 6' (182.9 cm) Weight: 233 lb 11 oz (106 kg) IBW/kg (Calculated) : 77.6  Vital Signs: Temp: 97.9 F (36.6 C) (08/30 2030) Temp Source: Oral (08/30 2030) BP: 117/81 (08/30 2000) Pulse Rate: 69 (08/30 2000)  Labs:  Recent Labs  06/17/17 0232 06/18/17 0400 06/18/17 0452 06/19/17 0228 06/19/17 0253 06/19/17 1304 06/19/17 2000  HGB 9.4* 8.7*  --   --  8.6*  --   --   HCT 30.8* 27.4*  --   --  28.2*  --   --   PLT 218 174  --   --  143*  --   --   APTT  --   --   --   --   --  93* 126*  LABPROT 17.9*  --  17.1*  --  18.2*  --   --   INR 1.46  --  1.41  --  1.52  --   --   HEPARINUNFRC 0.33  --  0.27* 0.37  --   --   --   CREATININE 1.23 1.17  --   --  1.22  --   --     Estimated Creatinine Clearance: 87.1 mL/min (by C-G formula based on SCr of 1.22 mg/dL).   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  No overt bleeding.  APTT is supratherapeutic at 126. Verified with RN that level was drawn appropriately.  Goal of Therapy:  APTT 50-85 seconds INR 2-2.5, 3-3.5 on bivalirudin Monitor platelets by anticoagulation protocol: Yes   Plan:  1) Stop bivalrudin infusion for one hour, then resume at 0.06 mg/kg/hr 2) 2 hour aPTT 3) then APTT every 4 hours until therapeutic x 2 then daily   Thank you for allowing us to participate in this patients care.  Signe Coltonya C Marjani Kobel, PharmD Clinical phone for 06/19/2017 from 3:30-10:30p: [phone] If after 10:30p, please call main pharmacy at: [phone] 06/19/2017 9:08 PM

## 2017-06-19 NOTE — Progress Notes (Signed)
Inpatient Rehabilitation  Received request from acute medical team for addendum/update to IP Rehab consult [facility].  Given that [name] is medically stable and ready for post a cute therapies plan for rehab MD to follow up later today.  Discussed potential plan with patient and spouse for therapies to start at an intensity of 15/7 and build up to 3 hours of therapy a day 5 out of 7 days of the week.  Plan to initiate insurance authorization after rehab MD note.  Please call with questions.  Charlane FerrettiMelissa Leonette Tischer, M.A., CCC/SLP Admission Coordinator  [facility]  Cell [phone]

## 2017-06-19 NOTE — Progress Notes (Signed)
  Speech Language Pathology Treatment: Dysphagia  Patient Details Name: James [name] MRN: [id number] DOB: 07-Jun-1961 Today's Date: 06/19/2017 Time: [id number] SLP Time Calculation (min) (ACUTE ONLY): 18 min  Assessment / Plan / Recommendation Clinical Impression  Initially declined to participate in therapy but agreeable with encouragement. Pt self fed with set up assist, single ice chips with delayed throat clear and cough (mild) at the end of session. Expiratory muscle strength trainer used for approximately 3 sets 10 reps with reporting moderate exertion level. Pt is significantly weak with what appears to be min-mild reserve. Most recent instrumental swallow assessment was 8/24. Given severity of dysphagia and weakness, recommend continue expiratory and pharyngeal muscle strength training and only repeat instrumental when significant improvements seen in respiratory, phonatory and overall strength.    HPI HPI: 56 year old male admitted 09-06-17 for LVAD placement. PMH significant for gout, DM2, HTN, CKD, CAD s/p CABGx6 (2012), systemic heart failure, [employer] Jude ICD. 8/7 with decreased MS, confusion.  Head CT with right superior cerebellar artery lacunar infarcts, not acute.  Pt has had 4 intubations this admission.  The first three intubations were for 24 hour periods; the last 8/8-8/22.        SLP Plan  Continue with current plan of care       Recommendations  Diet recommendations: NPO Medication Administration: Via alternative means                Oral Care Recommendations: Oral care prior to ice chip/H20 SLP Visit Diagnosis: Dysphagia, pharyngeal phase (R13.13);Dysphagia, pharyngoesophageal phase (R13.14) Plan: Continue with current plan of care       GO                [name], Gwendolynn Merkey Willis 06/19/2017, 2:08 PM  [name] PrestonLitaker M.Ed [employer]-SLP Pager [phone]

## 2017-06-19 NOTE — Progress Notes (Signed)
Patient ID: James [name], male   DOB: September 10, 1961, 56 y.o.   MRN: [id number]  This NP visited patient at the bedside as a follow up for palliative needs and emotional support.  Short visit at bedside with patient and his mother.  Main concern for family is question of viable options for rehabilitation.  LVAD team working hard for placement.    Patient verbalizes no worries or concerns at this time.  Discussed with patient the importance of continued conversation with family and their  medical providers regarding overall plan of care and treatment options,  ensuring decisions are within the context of the patients values and GOCs.  Questions and concerns addressed      Social visit  Please call PMT team phone over the week-end with any needs  #[phone]  No charge  Lorinda CreedMary Jakevious Hollister NP  Palliative Medicine Team Team Phone # [phone] Pager [phone]

## 2017-06-19 NOTE — Progress Notes (Signed)
06/19/2017 11:31 AM Phlebotomy notified of need for LAB draw for APTT, since Angiomax infusing through PICC line. Will continue to closely monitor patient.  Damascus Feldpausch, Blanchard KelchStephanie Ingold

## 2017-06-20 LAB — GLUCOSE, CAPILLARY
GLUCOSE-CAPILLARY: 120 mg/dL — AB (ref 65–99)
GLUCOSE-CAPILLARY: 123 mg/dL — AB (ref 65–99)
GLUCOSE-CAPILLARY: 144 mg/dL — AB (ref 65–99)
GLUCOSE-CAPILLARY: 148 mg/dL — AB (ref 65–99)
GLUCOSE-CAPILLARY: 152 mg/dL — AB (ref 65–99)
Glucose-Capillary: 128 mg/dL — ABNORMAL HIGH (ref 65–99)
Glucose-Capillary: 142 mg/dL — ABNORMAL HIGH (ref 65–99)

## 2017-06-20 LAB — BASIC METABOLIC PANEL
Anion gap: 6 (ref 5–15)
BUN: 50 mg/dL — ABNORMAL HIGH (ref 6–20)
CALCIUM: 8 mg/dL — AB (ref 8.9–10.3)
CHLORIDE: 123 mmol/L — AB (ref 101–111)
CO2: 24 mmol/L (ref 22–32)
CREATININE: 1.27 mg/dL — AB (ref 0.61–1.24)
Glucose, Bld: 169 mg/dL — ABNORMAL HIGH (ref 65–99)
Potassium: 3.4 mmol/L — ABNORMAL LOW (ref 3.5–5.1)
SODIUM: 153 mmol/L — AB (ref 135–145)

## 2017-06-20 LAB — CBC
HCT: 26.1 % — ABNORMAL LOW (ref 39.0–52.0)
Hemoglobin: 8 g/dL — ABNORMAL LOW (ref 13.0–17.0)
MCH: 28.7 pg (ref 26.0–34.0)
MCHC: 30.7 g/dL (ref 30.0–36.0)
MCV: 93.5 fL (ref 78.0–100.0)
PLATELETS: 144 10*3/uL — AB (ref 150–400)
RBC: 2.79 MIL/uL — AB (ref 4.22–5.81)
RDW: 21.5 % — ABNORMAL HIGH (ref 11.5–15.5)
WBC: 10.6 10*3/uL — AB (ref 4.0–10.5)

## 2017-06-20 LAB — PROTIME-INR
INR: 2.39
Prothrombin Time: 25.9 seconds — ABNORMAL HIGH (ref 11.4–15.2)

## 2017-06-20 LAB — APTT
APTT: 86 s — AB (ref 24–36)
APTT: 114 s — AB (ref 24–36)
aPTT: 107 seconds — ABNORMAL HIGH (ref 24–36)
aPTT: 85 seconds — ABNORMAL HIGH (ref 24–36)

## 2017-06-20 LAB — LACTATE DEHYDROGENASE: LDH: 175 U/L (ref 98–192)

## 2017-06-20 LAB — HEPARIN INDUCED PLATELET AB (HIT ANTIBODY): HEPARIN INDUCED PLT AB: 0.331 {OD_unit} (ref 0.000–0.400)

## 2017-06-20 MED ORDER — POTASSIUM CHLORIDE 20 MEQ PO PACK
20.0000 meq | PACK | Freq: Once | ORAL | Status: DC
  Filled 2017-06-20: qty 1

## 2017-06-20 MED ORDER — DOXAZOSIN MESYLATE 4 MG PO TABS
4.0000 mg | ORAL_TABLET | Freq: Two times a day (BID) | ORAL | Status: DC
  Administered 2017-06-20 – 2017-06-24 (×10): 4 mg via ORAL
  Filled 2017-06-20 (×10): qty 1

## 2017-06-20 MED ORDER — SODIUM CHLORIDE 0.45 % IV SOLN
INTRAVENOUS | Status: DC | PRN
  Administered 2017-06-20 – 2017-06-21 (×2): via INTRAVENOUS
  Administered 2017-06-22: 10 mL via INTRAVENOUS

## 2017-06-20 MED ORDER — BIVALIRUDIN TRIFLUOROACETATE 250 MG IV SOLR
0.0150 mg/kg/h | INTRAVENOUS | Status: DC
  Administered 2017-06-20: 0.015 mg/kg/h via INTRAVENOUS
  Filled 2017-06-20 (×5): qty 250

## 2017-06-20 MED ORDER — FUROSEMIDE 10 MG/ML IJ SOLN
40.0000 mg | Freq: Once | INTRAMUSCULAR | Status: AC
  Administered 2017-06-20: 40 mg via INTRAVENOUS
  Filled 2017-06-20: qty 4

## 2017-06-20 MED ORDER — WARFARIN SODIUM 7.5 MG PO TABS
7.5000 mg | ORAL_TABLET | Freq: Once | ORAL | Status: AC
  Administered 2017-06-20: 7.5 mg via ORAL
  Filled 2017-06-20: qty 1

## 2017-06-20 MED ORDER — POTASSIUM CHLORIDE 10 MEQ/50ML IV SOLN
10.0000 meq | INTRAVENOUS | Status: AC
  Administered 2017-06-20 (×3): 10 meq via INTRAVENOUS
  Filled 2017-06-20 (×3): qty 50

## 2017-06-20 MED ORDER — SODIUM CHLORIDE 0.9 % IV SOLN
0.0300 mg/kg/h | INTRAVENOUS | Status: DC
  Filled 2017-06-20: qty 250

## 2017-06-20 NOTE — Progress Notes (Addendum)
OT Cancellation Note  Patient Details Name: James [name] MRN: [id number] DOB: 03-Oct-1961   Cancelled Treatment:    Reason Eval/Treat Not Completed: Fatigue/lethargy limiting ability to participate.  Pt undergoing hydro first attempt to see, then unable to sustain arousal/eye opening due to fatigue. RN reports will attempt to transfer him to chair later.  Will continue to attempt.   Kalyssa Anker [place], OTR/L [phone]   [name], Stephaine Breshears M 06/20/2017, 3:23 PM

## 2017-06-20 NOTE — Clinical Social Work Note (Signed)
Clinical Social Work Assessment  Patient Details  Name: James [name] MRN: [id number] Date of Birth: May 28, 1961  Date of referral:  06/20/17               Reason for consult:  Facility Placement                Permission sought to share information with:  Oceanographeracility Contact Representative Permission granted to share information::  Yes, Verbal Permission Granted  Name::     Insurance account managerMarcie  Agency::  SNF  Relationship::  wife  Contact Information:     Housing/Transportation Living arrangements for the past 2 months:   (hospital) Source of Information:  Patient, Spouse Patient Interpreter Needed:  None Criminal Activity/Legal Involvement Pertinent to Current Situation/Hospitalization:  No - Comment as needed Significant Relationships:  Parents, Spouse Lives with:  Spouse Do you feel safe going back to the place where you live?  No Need for family participation in patient care:  Yes (Comment) (help with coordinating care)  Care giving concerns:  Pt from home with wife- critically ill and does not have enough physical of medical support to return home safely   Social Worker assessment / plan:  CSW spoke with pt, pt wife, and pt mom.  Explained CSW role in organizing SNF if CIR or LTAC do not work out for patient.    Employment status:  Disabled (Comment on whether or not currently receiving Disability) Insurance information:  Managed Medicare PT Recommendations:  Inpatient Rehab Consult Information / Referral to community resources:  Skilled Nursing Facility  Patient/Family's Response to care:  Agreeable to CSW involvement and understand SNF as back up option for pt though want whatever opportunity will give patient the most efficient rehab.  Patient/Family's Understanding of and Emotional Response to Diagnosis, Current Treatment, and Prognosis:  Patient wife hopeful for patient to get best care possible and overwhelmed by pt continued medical concerns and barriers to progression.  Emotional  Assessment Appearance:  Appears stated age Attitude/Demeanor/Rapport:  Sedated Affect (typically observed):  Quiet Orientation:  Oriented to Self, Oriented to Place, Oriented to Situation Alcohol / Substance use:  Not Applicable Psych involvement (Current and /or in the community):  No (Comment)  Discharge Needs  Concerns to be addressed:  Care Coordination Readmission within the last 30 days:  No Current discharge risk:  Physical Impairment, Chronically ill Barriers to Discharge:  Continued Medical Work up   Burna SisUris, Peja Allender H, LCSW 06/20/2017, 2:08 PM

## 2017-06-20 NOTE — Progress Notes (Addendum)
ANTICOAGULATION CONSULT NOTE - Follow Up Consult  Pharmacy Consult for Bivalirudin  Indication: HVAD  Allergies  Allergen Reactions  . 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    Patient Measurements: Height: 6' (182.9 cm) Weight: 233 lb 11 oz (106 kg) IBW/kg (Calculated) : 77.6   Vital Signs: Temp: 98.2 F (36.8 C) (08/30 2357) Temp Source: Oral (08/30 2357) BP: 102/75 (08/31 0000) Pulse Rate: 73 (08/31 0000)  Labs:  Recent Labs  06/17/17 0232 06/18/17 0400 06/18/17 0452 06/19/17 0228 06/19/17 0253 06/19/17 1304 06/19/17 2000 06/19/17 2347  HGB 9.4* 8.7*  --   --  8.6*  --   --   --   HCT 30.8* 27.4*  --   --  28.2*  --   --   --   PLT 218 174  --   --  143*  --   --   --   APTT  --   --   --   --   --  93* 126* 114*  LABPROT 17.9*  --  17.1*  --  18.2*  --   --   --   INR 1.46  --  1.41  --  1.52  --   --   --   HEPARINUNFRC 0.33  --  0.27* 0.37  --   --   --   --   CREATININE 1.23 1.17  --   --  1.22  --   --   --     Estimated Creatinine Clearance: 87.1 mL/min (by C-G formula based on SCr of 1.22 mg/dL).   Assessment: 56 y/o M HVAD pt transitioned from heparin to bivalirudin in the setting of dropping PLTS, aPTT remains supra-therapeutic despite holding infusion and reducing the rate. No issues per RN.   Goal of Therapy:  aPTT 50-85 seconds Monitor platelets by anticoagulation protocol: Yes   Plan:  -Hold infusion x 1 hr -Re-start bivalirudin at 0.03 mg/kg/hr at 0200 -Check 0500 aPTT  Abran DukeLedford, Tequila Rottmann 06/20/2017,1:05 AM   =========================== Addendum 6:40 AM Repeat aPTT still elevated at 107 this AM, no issues per RN -Hold bivalirudin x 1 hour -Re-start at 0.015 mg/kg/hr at 0730 -1100 aPTT Abran DukeJames Bacilio Abascal, PharmD, BCPS Clinical Pharmacist Phone: [phone] ============================

## 2017-06-20 NOTE — Progress Notes (Signed)
Patient ID: James [name], male   DOB: 1961/07/25, 56 y.o.   MRN: [id number]   Advanced Heart Failure VAD Team Note  Subjective:    Events: --HVAD placed 6/28.  Returned to the OR that evening with high chest tube output, evacuation of mediastinal hematoma.  --Extubated 6/29. Milrinone stopped on 7/4. --7/4, patient developed ileus with respiratory compromise. NGT placed with 3 L suctioned out.  CXR with suspicion for aspiration PNA.  Patient had to be intubated.  He went into atrial fibrillation with RVR.  He became hypotensive and was started on norepinephrine and phenylephrine.  Amiodarone gtt begun.    --Extubated again on 7/6.  -- 7/13 Had 2 sustained episodes of VT. Had to be cardioverted 1. Second episode broke with overdrive pacing with Dr. Caryl Comes. VAD speed turned down to 2700.  --Melena due to acuteGI bleed --> 2 units PRBCs 7/14, 2 units 7/15. 3 U PRBCs 7/16,  05/15/2017 3 UPRBCs.  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  He had epinephrine, APC, and 2 clips. Intubated prior to procedure and placed on norepinephrine. Speed dropped to 2660.  --7/18 Back in VT with overdrive pacing. Lidocaine was started in addition to amiodarone. Extubated. --7/19 lidocaine stopped with confusion. Neuro consulted.  EEG normal.  CT of head no acute findings. 7/20 confusion had resolved.  --1 unit PRBCs 7/20.  --7/22, developed sustained VT with rate around 130 shortly after getting IV Lasix.  We were able to pace him out and back to NSR.  He was put back on amiodarone gtt.  --More VT on 7/25, paced out.  Back on IV amiodarone gtt.  NSR.  -- 7/31 Ramp ECHO decrease speed 2600 rpm.  -- 8/1 VT- paced out -- 8/2 VT - started on sotalol -- 8/6 Hgb 7.5, 1 unit PRBCs -- 8/7 Hgb 9.5, confused.  Head CT with right superior cerebellar artery lacunar infarcts, not acute. -- 8/8 Possible aspiration. Reintubated. -- 8/10 possible seizure => Depakote.  CT head no  change.  1 unit PRBCs.  -- Multiple suction alarms on 8/17 night which persisted so suction alarm deactivated.  -- Extubated 8/23  MAP remains 80s-90s.  He is alert today, converses.  Very weak, continues PT/OT work.  Remains NPO due to pharyngeal muscle weakness.   Remains on bivalirudin gtt + warfarin.  Platelet fall has stabilized.   Getting hydrotherapy.   Weight up today, CVP 12-13 on my measure.   Sodium 155 => 153 => 153 => 153  HVAD INTERROGATION:  HVAD:  Flow 4 liters/min, speed 2450,  power 3.5 W,  Peak 6.8 Trough 1.8. Suction alarms off. Lavare on.   Objective:    Vital Signs:   Temp:  [97.9 F (36.6 C)-98.2 F (36.8 C)] 98.1 F (36.7 C) (08/31 0359) Pulse Rate:  [64-81] 70 (08/31 0700) Resp:  [15-34] 31 (08/31 0700) BP: (88-117)/(69-87) 100/75 (08/31 0700) SpO2:  [98 %-100 %] 98 % (08/31 0700) Weight:  [238 lb (108 kg)] 238 lb (108 kg) (08/31 0400) Last BM Date: 06/20/17 Mean arterial Pressure 80s-90s Intake/Output:   Intake/Output Summary (Last 24 hours) at 06/20/17 0808 Last data filed at 06/20/17 0700  Gross per 24 hour  Intake          3429.03 ml  Output             1235 ml  Net          2194.03 ml  Physical Exam    Physical Exam: CVP 12 GENERAL: Fatigued and chronically ill appearing. NAD.  HEENT: Normal. NECK: Supple, JVP 10 cm. Carotids OK.  CARDIAC: Mechanical heart sounds with LVAD hum present.  LUNGS: CTAB, normal effort.  ABDOMEN: NT, ND, no HSM. No bruits or masses. +BS LVAD exit site: Dressing dry and intact. No erythema or drainage. Stabilization device present and accurately applied. Driveline dressing changed daily per sterile technique. EXTREMITIES: Warm and dry. No cyanosis or clubbing. Diffuse gouty abnormalities. LEs wrapped. 1+ edema into thighs.  NEUROLOGIC: Alert &oriented x 3. Cranial nerves grossly intact. Moves all 4 extremities w/o difficulty. Affect flat but appropriate.   Telemetry   Personally reviewed,  NSR 60s-70s with occasional pacing  Labs   Basic Metabolic Panel:  Recent Labs Lab 06/16/17 0328 06/17/17 0232 06/18/17 0400 06/19/17 0253 06/20/17 0445  NA 154* 155* 153* 153* 153*  K 3.6 3.4* 3.2* 3.2* 3.4*  CL 123* 123* 122* 124* 123*  CO2 _0 GLUCOSE 144* 149* 163* 139* 169*  BUN 54* 52* 48* 51* 50*  CREATININE 1.25* 1.23 1.17 1.22 1.27*  CALCIUM 8.4* 8.4* 8.0* 8.1* 8.0*  MG  --   --  1.6*  --   --     Liver Function Tests: No results for input(s): AST, ALT, ALKPHOS, BILITOT, PROT, ALBUMIN in the last 168 hours. No results for input(s): LIPASE, AMYLASE in the last 168 hours. No results for input(s): AMMONIA in the last 168 hours.  CBC:  Recent Labs Lab 06/16/17 0328 06/17/17 0232 06/18/17 0400 06/19/17 0253 06/20/17 0445  WBC 10.9* 10.0 8.4 8.9 10.6*  HGB 9.9* 9.4* 8.7* 8.6* 8.0*  HCT 31.9* 30.8* 27.4* 28.2* 26.1*  MCV 93.0 93.1 92.6 93.4 93.5  PLT 256 218 174 143* 144*    INR:  Recent Labs Lab 06/16/17 0328 06/17/17 0232 06/18/17 0452 06/19/17 0253 06/20/17 0547  INR 1.39 1.46 1.41 1.52 2.39   Other results:    Imaging   No results found.  Medications:    Scheduled Medications: . amiodarone  200 mg Per Tube Daily  . amLODipine  10 mg Oral BID  . chlorhexidine gluconate (MEDLINE KIT)  15 mL Mouth Rinse BID  . Chlorhexidine Gluconate Cloth  6 each Topical q morning - 10a  . collagenase   Topical Daily  . doxazosin  4 mg Oral Q12H  . febuxostat  120 mg Oral Daily  . feeding supplement (NEPRO CARB STEADY)  1,000 mL Per Tube Q24H  . feeding supplement (PRO-STAT SUGAR FREE 64)  60 mL Per Tube BID  . free water  400 mL Per Tube Q4H  . hydrALAZINE  100 mg Oral Q8H  . insulin aspart  0-15 Units Subcutaneous Q4H  . levETIRAcetam  500 mg Per Tube BID  . levothyroxine  50 mcg Per NG tube QAC breakfast  . magnesium oxide  400 mg Per Tube Daily  . mouth rinse  15 mL Mouth Rinse Q4H  . mexiletine  300 mg Per Tube Q12H  .  pantoprazole sodium  40 mg Per Tube BID  . sodium chloride flush  10-40 mL Intracatheter Q12H  . sotalol  80 mg Per Tube Daily  . spironolactone  25 mg Oral Daily  . Warfarin - Pharmacist Dosing Inpatient   Does not apply q1800    Infusions: . sodium chloride 10 mL/hr at 06/19/17 2000  . bivalirudin (ANGIOMAX) infusion 0.5 mg/mL (Non-ACS indications) 0.015 mg/kg/hr (06/20/17 0754)  .  potassium chloride 10 mEq (06/20/17 0753)    PRN Medications: acetaminophen (TYLENOL) oral liquid 160 mg/5 mL, albuterol, docusate, fentaNYL (SUBLIMAZE) injection, Gerhardt's butt cream, hydrALAZINE, midazolam, morphine injection, morphine, neomycin-bacitracin-polymyxin, ondansetron (ZOFRAN) IV, oxyCODONE, pneumococcal 23 valent vaccine, silver nitrate applicators, sodium chloride flush   Patient Profile   56 yo with CAD s/p CABG, ischemic cardiomyopathy/chronic systolic CHF, tophaceous gout, and CKD stage 3 was admitted for diuresis and consideration for LVAD placement. S/p HVAD on 6/28  Assessment/Plan:    1. Acute/chronic systolic CHF s/p HVAD: Ischemic cardiomyopathy.  [employer] Jude ICD.  Echo (6/18) with EF 15%, mildly dilated RV with moderately decreased systolic function.  s/p HVAD placement 6/28 and had to return to OR to evacuate mediastinal hematoma.  He had been weaned off pressors/milrinone, but developed ileus w/ likely aspiration event 7/4, re-intubated, developed afib/RVR requiring norepinephrine. Extubated 7/6.  VAD speed turned down to 2540 with VT. VAD parameters currently look good.  Milrinone stopped 7/17 with recurrent VT.  He was started back on norepinephrine 8/9 due to soft BP after intubation and suspected septic shock, now off norepinephrine.  MAP improved but still in 90s at times, getting hydralazine per tube as well as amlodipine, doxazosin, and spironolactone.  Wide peak/trough may be related to HTN => improving as BP comes down. He has had massive 3rd spacing.  Legs now wrapped.  Today,  CVP 12-13 on my measure and weight up.  - Lasix 40 mg IV x 1 today.  - Wrap legs to knees.  - MAP continues to run quite high but improving.  Increase doxazosin to 4 mg bid.    - Off ASA with GI Bleed  - Goal INR 2-2.5, INR 2.39 today but on bivalirudin.  Pharmacy to manage bivalirudin + warfarin overlap.   2. AKI on CKD stage 3: Creatinine stable.  3. Symptomatic anemia due to acute UGI bleeding:  Received 3 units PRBCs 7/16 and 3 units PRBCs on 04/22/2017. 1 unit PRBCs 7/20. 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  Requiring 2 clips + epi + APC. 1 unit PRBCs 8/6.  He had 1 unit PRBCs 8/10 to provide volume.  1 unit PRBCs on 8/22.  No overt bleeding.   - Got Feraheme 7/28.  - Continue Protonix 40 po bid. - Transfuse hgb < 8.  4. Ventricular tachycardia: Status post ventricular tachycardia 2 on 7/13. Possible suction event. VAD speed turned down to 2700.  Recurrent VT 7/17. EP called to bedside. Overdrive pacing successful for about 10 minutes but VT recurred.  Milrinone turned off, remained in VT overnight but hemodynamically stable.  Speed decreased to 2600.  On 7/18, we were able to pace him out of VT.  Lidocaine added then stopped due to confusion/mental status changes.  He had VT 7/22 about 30 minutes after getting IV Lasix, had to be paced out again => may have been due to rapid fluid shift.  Pt paced out of VT 3 separate occasions 7/25. Got amio bolus x 2 and started back on IV infusion.  Transitioned to amiodarone 400 mg tid.  On 8/1 had recurrent VT and paced out again.  Recurrent VT 8/2 per EP sotalol started along with amiodarone, sotalol decreased to once daily with bradycardia. Remains quiescent.  - Continue mexiletine to 300 mg BID, sotalol 80 mg daily.  - Continue amiodarone 200 mg daily.  - Keep K > 4.0 Mg > 2.0 => give Mg today.  5. Paroxysmal Atrial  fibrillation:  Developed atrial fibrillation with RVR in setting of aspiration PNA on 7/4.  In NSR  currently on triple AA therapy. No change.  6. Malnutrition:  Continue tube feeds via Cor-track. Nutrition following. No change.  7. Aspiration PNA with acute hypoxemic respiratory failure on 7/4: He was extubated on 7/6. Tracheal aspirate with Klebsiella and citrobacter. Both sensitive to Zosyn. Completed abx 7/13. Possible aspiration 8/8. Treated with meropenem. Stopped 8/24 - Off abx. No change.  - He remains NPO, everything via Cor-track.  May end up needing PEG.  8. Acute Respiratory Failure: Intubated 7/17 in setting of complex GI intervention. Extubated 7/18. Re-intubated 8/8.  Weak respiratory muscles. He has not wanted a tracheostomy.  Extubated on 8/22.   - Remains stable off vent.  9. CAD: s/p CABG 2012. No chest pain.  Off aspirin due to GI bleeding. No change.  10. Gout: Severe tophaceous gout.  - Continue uloric. Stopped colchicine with diarrhea. Flare now resolved. Can stop prednisone.  11. Delirium: Appreciate neuro input Possibly related to lidocaine.  Lidocaine stopped. CT of head negative. EEG ok. No further workup per neuro. Neuro  reconsulted 8/7 with AMS => ?related to fever/infection.  He has been more clear on vent.  CT head 05/27/17 and 8/10 with lacunar infarcts but do not appear acute.  Seizure activity versus myoclonus => Depakote started.  Switched to [place] with ?drug rash from Depakote.   - Currently resolved. 12. UTI: Treated/resolved.   13. Unstageable Pressure Ulcer: Sacrum. Santyl daily to enzymatically debride. Reposition R to L.  - Continue with hydrotherapy. No change. Appreciate WOC care.  14. Bradycardia: With hypotension.  Currently back-up pacing set at 60 bpm.  Does not have atrial lead so pacing asynchronously. Currently pacing only rarely.    15. Hypothyroidism: May be related to amiodarone, he is on Levoxyl. No change.    16. ID: See above - Meropenem stopped 8/24 . No change.  18. Severe Deconditioning: Critical care myopathy. Continue PT. CIR MD saw,  not yet appropriate.  19. Hypernatremia: sodium 153, slow down trend. Continue free water boluses 400 q4 hrs. 20. Thrombocytopenia: Mild but plts dropped 50% over a few days.  Sent HIT Ab and changed to bivalirudin. Plts stabilized today.  21. Pharyngeal muscle weakness: Remains NPO with tube feeds.  Speech/swallow following.   We will not be able to get him to CIR.  Will explore LTAC.  May need to wait a few more days with more PT and reassess for CIR.   I reviewed the HVAD parameters from today, and compared the results to the patient's prior recorded data.  No programming changes were made.  The HVAD is functioning within specified parameters.    Loralie Champagne, MD 06/20/2017, 8:08 AM  VAD Team --- VAD ISSUES ONLY--- Pager [phone] (7am - 7am) Advanced Heart Failure Team  Pager [phone] (M-F; 7a - 4p)  Please contact [facility] for night-coverage after hours (4p -7a ) and weekends on [email or web address]

## 2017-06-20 NOTE — NC FL2 (Signed)
[place] MEDICAID FL2 LEVEL OF CARE SCREENING TOOL     IDENTIFICATION  Patient Name: James [name] Birthdate: [birth year] Sex: male Admission Date (Current Location): 03/31/2017  [facility] and [place] Number:      Facility and Address:  [facility] [place] [place] [address], [place], [place] [address]      Provider Number: [id number]  Attending Physician Name and Address:  Gaye Pollack, MD  Relative Name and Phone Number:       Current Level of Care: Hospital Recommended Level of Care: [place] Prior Approval Number:    Date Approved/Denied:   PASRR Number:    Discharge Plan: SNF    Current Diagnoses: Patient Active Problem List   Diagnosis Date Noted  . Weakness generalized   . Aspiration pneumonia of left lower lobe due to regurgitated food ([place])   . Palliative care encounter   . Acute encephalopathy   . Pressure injury of skin 05/17/2017  . Echolalia   . Other tics of organic origin   . Upper GI bleed   . VT (ventricular tachycardia) ([place])   . Acute blood loss anemia   . Blood in stool   . Melena   . Elevated INR (international normalized ratio) due to prior anticoagulant medication ingestion   . Heart failure, type unknown ([place])   . Ileus ([place])   . Presence of left ventricular assist device (LVAD) ([place])   . History of gout   . Coronary artery disease involving coronary bypass graft of native heart without angina pectoris   . Chronic kidney disease   . Diabetes mellitus type 2 in nonobese (HCC)   . Atrial fibrillation with rapid ventricular response ([place])   . Tachypnea   . Acute respiratory failure ([place])   . LVAD (left ventricular assist device) present ([place]) 04/05/2017  . Palliative care by specialist 04/16/2017  . DNR (do not resuscitate) discussion 04/16/2017  . Anemia, chronic disease   . Colon cancer screening   . Acute on chronic systolic CHF (congestive heart failure) ([place]) 04/10/2017  . Acute on chronic systolic  heart failure, NYHA class 3 ([place]) 03/23/2017  . Acute kidney injury ([place]) 03/02/2017  . Polyarticular gout   . Renal insufficiency   . AKI (acute kidney injury) ([place]) 03/01/2017  . DM (diabetes mellitus), type 2 with renal complications ([place]) 24/82/5003  . Hyponatremia 03/01/2017  . Cardiomyopathy ([place]) 10/06/2015  . CHF (congestive heart failure) ([place]) 10/06/2015  . AICD (automatic cardioverter/defibrillator) present 09/21/2015  . CKD (chronic kidney disease) stage 3, GFR 30-59 ml/min 08/01/2015  . Depression 02/07/2012  . Chronic systolic congestive heart failure ([place]) 09/18/2011  . Congestion 08/22/2011  . Dyslipidemia 07/08/2011  . ED (erectile dysfunction) 06/25/2011  . DM (diabetes mellitus), type 2 ([place]) 12/17/2010  . Gout, unspecified 12/17/2010  . HYPONATREMIA 12/17/2010  . OVERWEIGHT/OBESITY 12/17/2010  . HYPERTENSION, BENIGN 12/17/2010  . ISCHEMIC CARDIOMYOPATHY 12/17/2010  . RENAL INSUFFICIENCY 12/17/2010  . EDEMA 12/17/2010  . CORONARY ARTERY BYPASS GRAFT, HX OF 12/17/2010    Orientation RESPIRATION BLADDER Height & Weight     Self, Place, Situation  O2 (2L Axis) Incontinent, Indwelling catheter Weight: 238 lb (108 kg) Height:  6' (182.9 cm)  BEHAVIORAL SYMPTOMS/MOOD NEUROLOGICAL BOWEL NUTRITION STATUS      Continent Diet (see DC summary)  AMBULATORY STATUS COMMUNICATION OF NEEDS Skin   Extensive Assist Verbally PU Stage and Appropriate Care (unstageable ulcer on sacrum requiring daily dressing changes moist to dry/gauze/foam)  Personal Care Assistance Level of Assistance  Bathing, Dressing Bathing Assistance: Maximum assistance   Dressing Assistance: Maximum assistance     Functional Limitations Info             SPECIAL CARE FACTORS FREQUENCY  PT (By licensed PT), OT (By licensed OT)     PT Frequency: 5/wk OT Frequency: 5/wk            Contractures      Additional Factors Info  Code Status, Allergies, Insulin  Sliding Scale, Isolation Precautions Code Status Info: DNR Allergies Info: Heparin   Insulin Sliding Scale Info: 6/day Isolation Precautions Info: MRSA     Current Medications (06/20/2017):  This is the current hospital active medication list Current Facility-Administered Medications  Medication Dose Route Frequency Provider Last Rate Last Dose  . 0.9 %  sodium chloride infusion   Intravenous Continuous Laurey Morale, MD 10 mL/hr at 06/19/17 2000    . acetaminophen (TYLENOL) solution 650 mg  650 mg Per Tube Q6H PRN Laurey Morale, MD   650 mg at 06/01/17 1145  . albuterol (PROVENTIL) (2.5 MG/3ML) 0.083% nebulizer solution 2.5 mg  2.5 mg Nebulization Q2H PRN Hammonds, Curt Jews, MD      . amiodarone (PACERONE) tablet 200 mg  200 mg Per Tube Daily Laurey Morale, MD   200 mg at 06/20/17 1004  . amLODipine (NORVASC) tablet 10 mg  10 mg Oral BID Laurey Morale, MD   10 mg at 06/20/17 5581  . bivalirudin (ANGIOMAX) 250 mg in sodium chloride 0.9 % 500 mL (0.5 mg/mL) infusion  0.015 mg/kg/hr Intravenous Continuous Stevphen Rochester, RPH 3.2 mL/hr at 06/20/17 1000 0.015 mg/kg/hr at 06/20/17 1000  . chlorhexidine gluconate (MEDLINE KIT) (PERIDEX) 0.12 % solution 15 mL  15 mL Mouth Rinse BID Alleen Borne, MD   15 mL at 06/20/17 0800  . Chlorhexidine Gluconate Cloth 2 % PADS 6 each  6 each Topical q morning - 10a Alleen Borne, MD   6 each at 06/19/17 1000  . collagenase (SANTYL) ointment   Topical Daily Clegg, Amy D, NP      . docusate (COLACE) 50 MG/5ML liquid 100 mg  100 mg Per Tube BID PRN Hammonds, Curt Jews, MD      . doxazosin (CARDURA) tablet 4 mg  4 mg Oral Q12H Laurey Morale, MD   4 mg at 06/20/17 0947  . febuxostat (ULORIC) tablet 120 mg  120 mg Oral Daily Laurey Morale, MD   120 mg at 06/20/17 0947  . feeding supplement (NEPRO CARB STEADY) liquid 1,000 mL  1,000 mL Per Tube Q24H Laurey Morale, MD 35 mL/hr at 06/20/17 1000 1,000 mL at 06/20/17 1000  . feeding supplement  (PRO-STAT SUGAR FREE 64) liquid 60 mL  60 mL Per Tube BID Alleen Borne, MD   60 mL at 06/20/17 1003  . fentaNYL (SUBLIMAZE) injection 25-50 mcg  25-50 mcg Intravenous Q1H PRN Roslynn Amble, MD   25 mcg at 06/19/17 [phone]  . free water 400 mL  400 mL Per Tube Q4H Laurey Morale, MD   400 mL at 06/20/17 0800  . Gerhardt's butt cream   Topical PRN Alleen Borne, MD      . hydrALAZINE (APRESOLINE) injection 10 mg  10 mg Intravenous Q4H PRN Alleen Borne, MD   10 mg at 06/17/17 1203  . hydrALAZINE (APRESOLINE) tablet 100 mg  100 mg Oral Q8H Bensimhon, Bevelyn Buckles,  MD   100 mg at 06/20/17 0605  . insulin aspart (novoLOG) injection 0-15 Units  0-15 Units Subcutaneous Q4H Larey Dresser, MD   2 Units at 06/20/17 [phone]  . levETIRAcetam (KEPPRA) 100 MG/ML solution 500 mg  500 mg Per Tube BID Shirley Friar, PA-C   500 mg at 06/20/17 0383  . levothyroxine (SYNTHROID, LEVOTHROID) tablet 50 mcg  50 mcg Per NG tube QAC breakfast Jennelle Human B, NP   50 mcg at 06/20/17 0806  . magnesium oxide (MAG-OX) tablet 400 mg  400 mg Per Tube Daily Gaye Pollack, MD   400 mg at 06/20/17 1003  . MEDLINE mouth rinse  15 mL Mouth Rinse Q4H Larey Dresser, MD   15 mL at 06/20/17 0400  . mexiletine (MEXITIL) capsule 300 mg  300 mg Per Tube Q12H Gaye Pollack, MD   300 mg at 06/20/17 0947  . midazolam (VERSED) injection 1-4 mg  1-4 mg Intravenous Q15 min PRN Javier Glazier, MD      . morphine 4 MG/ML injection 1-10 mg  1-10 mg Intravenous Q30 min PRN Javier Glazier, MD      . morphine bolus via infusion 1-10 mg  1-10 mg Intravenous Q15 min PRN Javier Glazier, MD      . neomycin-bacitracin-polymyxin (NEOSPORIN) ointment   Topical PRN Gaye Pollack, MD      . ondansetron (ZOFRAN) injection 4 mg  4 mg Intravenous Q6H PRN Gaye Pollack, MD   4 mg at 05/26/17 1056  . oxyCODONE (ROXICODONE) 5 MG/5ML solution 5-10 mg  5-10 mg Per Tube Q3H PRN Gaye Pollack, MD   10 mg at 06/11/17 0617  .  pantoprazole sodium (PROTONIX) 40 mg/20 mL oral suspension 40 mg  40 mg Per Tube BID Gaye Pollack, MD   40 mg at 06/20/17 1003  . pneumococcal 23 valent vaccine (PNU-IMMUNE) injection 0.5 mL  0.5 mL Intramuscular Prior to discharge Gaye Pollack, MD      . potassium chloride (KLOR-CON) packet 20 mEq  20 mEq Per Tube Once Larey Dresser, MD      . silver nitrate applicators applicator 3 Stick  3 Stick Topical PRN Prescott Gum, Collier Salina, MD      . sodium chloride flush (NS) 0.9 % injection 10-40 mL  10-40 mL Intracatheter Q12H Prescott Gum, Collier Salina, MD   10 mL at 06/20/17 0909  . sodium chloride flush (NS) 0.9 % injection 10-40 mL  10-40 mL Intracatheter PRN Ivin Poot, MD   10 mL at 05/28/17 0508  . sotalol (BETAPACE) tablet 80 mg  80 mg Per Tube Daily Gaye Pollack, MD   80 mg at 06/20/17 0947  . spironolactone (ALDACTONE) tablet 25 mg  25 mg Oral Daily Larey Dresser, MD   25 mg at 06/20/17 1004  . Warfarin - Pharmacist Dosing Inpatient   Does not apply q1800 Carney, Gay Filler, [facility]         Discharge Medications: Please see discharge summary for a list of discharge medications.  Relevant Imaging Results:  Relevant Lab Results:   Additional Information SS#: [id number]; New LVAD pt  Jorge Ny, LCSW

## 2017-06-20 NOTE — Progress Notes (Addendum)
Nutrition Follow Up   DOCUMENTATION CODES:   Severe malnutrition in context of chronic illness  INTERVENTION:    Continue Nepro with Carb Steady at goal rate of 35 ml/hr. Prostat 60 ml BID.  TF regimen providing 1912 kcals, 128 gm protein, 611 ml free water daily  NUTRITION DIAGNOSIS:   Malnutrition (severe) related to chronic illness (CHF) as evidenced by severe depletion of body fat, severe depletion of muscle mass, ongoing  GOAL:   Patient will meet greater than or equal to 90% of their needs, met  MONITOR:   TF tolerance, Labs, Weight trends, Skin, I & O's  ASSESSMENT:   56 yo male with hx of HTN, gout, obesity, chronic edema, CAD, ischemic cardiomyopathy, DM2, AICD, MI, CHF, renal insufficiency who was admitted on 6/22 with acute on chronic systolic heart failure.  6/28  HVAD placement 7/05  TPN started 7/06  Extubated 7/11  TPN D/C'd 8/08  Cortrak placed 8/22  Extubated  Nepro formula currently infusing at goal rate of 35 ml/hr via Cortrak feeding tube. Speech Path continues to follow. Ongoing pharyngeal weakness. Heart Failure Team note reviewed. Pt not ready yet for CIR.  Free water flushes at 400 ml every 4 hours. Labs reviewed. Na 153 (H). K 3.4 (L).  CBG's [id number].  Diet Order:  Diet NPO time specified Except for: Ice Chips  Skin:  unstageable pressure injury; sacrum   Last BM:  8/31   Height:   Ht Readings from Last 1 Encounters:  06/17/17 6' (1.829 m)   Weight:   Wt Readings from Last 1 Encounters:  06/20/17 238 lb (108 kg)  Admit weight 210 lb (95.3 kg)  Ideal Body Weight:  80.9 kg  BMI:  Body mass index is 32.28 kg/m. highly skewed   Estimated Nutritional Needs:   Kcal:  2000-2200  Protein:  115-130 gm  Fluid:  per MD  EDUCATION NEEDS:   No education needs identified at this time  Arthur Holms, RD, LDN Pager #: [phone] After-Hours Pager #: [phone]

## 2017-06-20 NOTE — Progress Notes (Signed)
Physical Therapy Wound Treatment Patient Details  Name: James [name] MRN: [id number] Date of Birth: 08/01/1961  Today's Date: 06/20/2017 Time: 1330-1430 Time Calculation (min): 60 min  Subjective  Subjective: Agreeable to hydrotherapy. Asking for pain meds prior to start. Patient and Family Stated Goals: healed up so I can do well with therapy  Pain Score: Premedicated  Wound Assessment  Pressure Injury 05/14/17 Unstageable - Full thickness tissue loss in which the base of the ulcer is covered by slough (yellow, tan, gray, green or brown) and/or eschar (tan, brown or black) in the wound bed. WOC updated, wound evolution to unstageable  (Active)  Dressing Type Moist to dry;Gauze (Comment);Foam 06/20/2017  3:03 PM  Dressing Changed 06/20/2017  3:03 PM  Dressing Change Frequency Daily 06/20/2017  3:03 PM  State of Healing Eschar 06/20/2017  3:03 PM  Site / Wound Assessment Pink;Red;Yellow 06/20/2017  3:03 PM  % Wound base Red or Granulating 25% 06/20/2017  3:03 PM  % Wound base Yellow/Fibrinous Exudate 75% 06/20/2017  3:03 PM  % Wound base Black/Eschar 0% 06/20/2017  3:03 PM  % Wound base Other/Granulation Tissue (Comment) 0% 06/20/2017  3:03 PM  Peri-wound Assessment Intact;Erythema (blanchable) 06/20/2017  3:03 PM  Wound Length (cm) 6.5 cm 06/20/2017  3:03 PM  Wound Width (cm) 8 cm 06/20/2017  3:03 PM  Wound Depth (cm) 3.6 cm 06/20/2017  3:03 PM  Tunneling (cm) 0 06/16/2017  8:00 PM  Undermining (cm) 5.0 cm 4:00-6:00, 2.6 cm at 12:00. 2.0 cm 10:00-12:00 06/20/2017  3:03 PM  Margins Unattached edges (unapproximated) 06/20/2017  3:03 PM  Drainage Amount Moderate 06/20/2017  3:03 PM  Drainage Description Serosanguineous;No odor 06/20/2017  3:03 PM  Treatment Debridement (Selective);Hydrotherapy (Pulse lavage);Packing (Saline gauze) 06/20/2017  3:03 PM   Santyl applied to wound bed prior to applying dressing.     Hydrotherapy Pulsed lavage therapy - wound location: sacral Pulsed Lavage with Suction  (psi): 12 psi Pulsed Lavage with Suction - Normal Saline Used: 1000 mL Pulsed Lavage Tip: Tip with splash shield Selective Debridement Selective Debridement - Location: sacral Selective Debridement - Tools Used: Forceps;Scissors;Scalpel Selective Debridement - Tissue Removed: Yellow necrotic tissue   Wound Assessment and Plan  Wound Therapy - Assess/Plan/Recommendations Wound Therapy - Clinical Statement: Wound bed continues to be covered in yellow slough/unviable tissue however overall quality of tissue appears to be improving. Pt will benefit from continued hydrotherapy for the pulsed lavage that will help cleansed wound, decrease bacterial load and otherwise prep wound for selective debridement. Wound Therapy - Functional Problem List: Relative immobility, decreased strength, compromised nutrition and skin integrity.  See PT notes Factors Delaying/Impairing Wound Healing: Incontinence;Infection - systemic/local;Immobility;Multiple medical problems Hydrotherapy Plan: Debridement;Dressing change;Patient/family education;Pulsatile lavage with suction Wound Therapy - Frequency: 6X / week Wound Therapy - Current Recommendations: PT;Case manager/social work;WOC nurse Wound Therapy - Follow Up Recommendations: Home health RN;Skilled nursing facility Wound Plan: see above  Wound Therapy Goals- Improve the function of patient's integumentary system by progressing the wound(s) through the phases of wound healing (inflammation - proliferation - remodeling) by: Decrease Necrotic Tissue to: 25 % Decrease Necrotic Tissue - Progress: Progressing toward goal Increase Granulation Tissue to: 75% including any viable tissues Increase Granulation Tissue - Progress: Progressing toward goal Improve Drainage Characteristics: Min;Serous Improve Drainage Characteristics - Progress: Progressing toward goal Additional Wound Therapy Goal - Progress: Progressing toward goal Goals/treatment plan/discharge plan were  made with and agreed upon by patient/family: Yes Time For Goal Achievement: 7 days Wound Therapy - Potential for  Goals: Good  Goals will be updated until maximal potential achieved or discharge criteria met.  Discharge criteria: when goals achieved, discharge from hospital, MD decision/surgical intervention, no progress towards goals, refusal/missing three consecutive treatments without notification or medical reason.  GP     Thelma Comp 06/20/2017, 3:18 PM   Rolinda Roan, PT, DPT Acute Rehabilitation Services Pager: [phone]

## 2017-06-20 NOTE — Progress Notes (Signed)
ANTICOAGULATION CONSULT NOTE - Follow Up Consult  Pharmacy Consult for  Bivalirudin + Coumadin Indication: HVAD  Allergies  Allergen Reactions  . 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    Patient Measurements: Height: 6' (182.9 cm) Weight: 238 lb (108 kg) IBW/kg (Calculated) : 77.6  Vital Signs: Temp: 98.6 F (37 C) (08/31 1212) Temp Source: Axillary (08/31 1212) BP: 104/80 (08/31 1500) Pulse Rate: 71 (08/31 1500)  Labs:  Recent Labs  06/18/17 0400 06/18/17 0452 06/19/17 0228 06/19/17 0253  06/20/17 0445 06/20/17 0547 06/20/17 1013 06/20/17 1452  HGB 8.7*  --   --  8.6*  --  8.0*  --   --   --   HCT 27.4*  --   --  28.2*  --  26.1*  --   --   --   PLT 174  --   --  143*  --  144*  --   --   --   APTT  --   --   --   --   < >  --  107* 86* 85*  LABPROT  --  17.1*  --  18.2*  --   --  25.9*  --   --   INR  --  1.41  --  1.52  --   --  2.39  --   --   HEPARINUNFRC  --  0.27* 0.37  --   --   --   --   --   --   CREATININE 1.17  --   --  1.22  --  1.27*  --   --   --   < > = values in this interval not displayed.  Estimated Creatinine Clearance: 84.5 mL/min (A) (by C-G formula based on SCr of 1.27 mg/dL (H)).   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 APTT is now therapeutic 86 seconds after multiple rate decreases. Platelet drop has stabilized 144. 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  Coumadin resumed 8/22. He had been very sensitive to coumadin previously but with improved nutritional status, vitamin k in tube feeds and decreased amiodarone dose, he is requiring higher doses. INR with jump overnight to 2.39 but falsely elevated due to bivalirudin and still below goal.  No overt bleeding. Hgb low but stable. LDH ok.   Confirmatory aPTT = 85, at goal  Goal of Therapy:  APTT 50-85 seconds INR 2-2.5, 3-3.5 on bivalirudin Monitor platelets by  anticoagulation protocol: Yes   Plan:  1) Continue bivalirudin @ 0.015mg /kg/hr 2) f/u AM labs  Bayard HuggerMei Mallarie Voorhies, PharmD, BCPS  Clinical Pharmacist  Pager: [phone]   06/20/2017 4:04 PM

## 2017-06-20 NOTE — Progress Notes (Signed)
ANTICOAGULATION CONSULT NOTE - Follow Up Consult  Pharmacy Consult for  Bivalirudin + Coumadin Indication: HVAD  Allergies  Allergen Reactions  . 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    Patient Measurements: Height: 6' (182.9 cm) Weight: 238 lb (108 kg) IBW/kg (Calculated) : 77.6  Vital Signs: Temp: 99.1 F (37.3 C) (08/31 0826) Temp Source: Axillary (08/31 0826) BP: 83/64 (08/31 1100) Pulse Rate: 61 (08/31 1100)  Labs:  Recent Labs  06/18/17 0400 06/18/17 0452 06/19/17 0228 06/19/17 0253  06/19/17 2347 06/20/17 0445 06/20/17 0547 06/20/17 1013  HGB 8.7*  --   --  8.6*  --   --  8.0*  --   --   HCT 27.4*  --   --  28.2*  --   --  26.1*  --   --   PLT 174  --   --  143*  --   --  144*  --   --   APTT  --   --   --   --   < > 114*  --  107* 86*  LABPROT  --  17.1*  --  18.2*  --   --   --  25.9*  --   INR  --  1.41  --  1.52  --   --   --  2.39  --   HEPARINUNFRC  --  0.27* 0.37  --   --   --   --   --   --   CREATININE 1.17  --   --  1.22  --   --  1.27*  --   --   < > = values in this interval not displayed.  Estimated Creatinine Clearance: 84.5 mL/min (A) (by C-G formula based on SCr of 1.27 mg/dL (H)).   Medications: Bivalirudin @ 0.015mg /kg/hr  Assessment: 56 y/o M s/p HVAD on 04/16/2017 switched from heparin to bivalirudin yesterday with drop in platelets and concern for HIT. APTT is now therapeutic 86 seconds after multiple rate decreases. Platelet drop has stabilized 144. 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  Coumadin resumed 8/22. He had been very sensitive to coumadin previously but with improved nutritional status, vitamin k in tube feeds and decreased amiodarone dose, he is requiring higher doses. INR with jump overnight to 2.39 but falsely elevated due to bivalirudin and still below goal.  No overt bleeding. Hgb low but stable. LDH ok.   Goal of Therapy:  APTT 50-85 seconds INR 2-2.5, 3-3.5 on bivalirudin Monitor platelets by anticoagulation protocol: Yes   Plan:   1) Continue bivalirudin @ 0.015mg /kg/hr 2) Check 4 hour APTT to confirm 3) Repeat coumadin 7.5mg  tonight 4) Daily APTT, INR, CBC  Louie CasaJennifer Nicolo Tomko, PharmD, BCPS 06/20/2017 11:47 AM

## 2017-06-20 NOTE — Progress Notes (Signed)
LVAD Inpatient Coordinator Rounding Note:  Admitted 04/19/2017 due to A/C Heart failure.   [facility] LVAD implanted on 2017-04-18 by Dr. Laneta SimmersBartle as DT VAD.  Transferred to 4E 05/17/17.  Transferred back to ICU 2H09 on 05/28/17 with possible aspiration pneumonia requiring re-intubation.   Vital signs: Temp: 98.6 HR: 71 Doppler: not done Auto BP: 110/83 (92) O2 Sat: 99% on 2 LNC Wt in lbs: Marland Kitchen... 217>223>224>224>234>240>245>235>234>236>239>234>232>232>232>233>238  LVAD interrogation reveals:  Speed: 2540 Flow: 4.1 - 4.5 Power: 3.4w Alarms: none Peak: 7.0 Trough: 2.0 HCT:  26  Low flow alarm setting: 1.7 High watt alarm setting: 5.5  Suction: OFF  Lavare cycle: on  Blood Products: 6/28> 5 PRBC's, 6 FFP 7/1> 1 PRBC 7/9> 1 PRBC 7/13>3 PRBC 7/14>3 PRBC 7/15> 1 PRBC 7/16>3PRBC 7/17> 3 PRBC 7/20> 1 PRBC 8/6> 1 PRBC 8/10 > 1 PRBC 8/22 > 1 PRBC  Gtts: Angiomax per pharmacy - switched to this from heparin due to platelets dropping  GI: 04/23/17 developed ileus with respiratory compromise - NG with 3 L suctioned out 05/03/17 - melena due to acute GI bleed -- PCs given as needed 04/27/2017 - 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  Neuro: 05/27/17- Neurology consult for lethargy, mouth tremors, not vocal, not following commands> Head CT and EEG 05/28/17 - Continual EEG monitoring  05/30/17 - started Depakote for seizure activity - stopped 06/03/17 for rash - will start Keppra   Arrhythmia: 04/24/17 - Afib with RVR - started amiodarone 05/02/17 - 2 sustained episodes of VT - cardioverted x 1; second broke with overdrive pacing 7/17- wide complex tachycardia- Amio bolus x3 and gtt at 60 mg/hr 05/07/17 - sustained VT - converted with overdrive pacing; Lidocaine started in addition to amiodarone 05/08/17 - Lidocaine stopped due to confusion (lido level 12.4) 7/22 - Sustained VT with rate 130 after getting IV lasix. Pace terminated,  back on IV amiodarone 05/14/17 - sustained VT - converted 3 separate occassions with overdrive pacing 05/19/17 - sustained VT-converted with overdrive pacing  8/1 - VT pace terminated 8/2 - VT started Sotalol 05/26/17- Afib rate in the 50's 05/27/17 - NSR   Respiratory: 04/18/17 - Extubated 04/23/17 - re-intubated due to respiratory failure secondary to suspected aspiration pneumonia 04/25/17-extubated 05/20/2017- intubated for EGD 04/20/2017- extubated 05/28/17 - re-intubated for suspected aspiration pneumonia 06/11/17 - one way extubation  Drive Line: Daily Dressing Kits with Aquacell AG silver strips per protocol -will advance to every other day dressing changes. Due 06/21/17  Labs:  LDH trend: .Marland Kitchen...158>156>156>146>154>188>142>138>127>128>148>164>175>170>187>175  INR trend: .... 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 He had epinephrine, APC, and 2 clips.  - 7/19 - Lidocaine gtt stopped due to confusion (lido toxicity) - 8/7- Neurology consult- lethargy, mouth tremors, not vocal, not following commands>Head CT and EEG, blood and urine cultures sent, Ammonia-20 -8/8 - re-intubated for possible aspiration pneumonia   Plan/Recommendations:  1. Currently every other day dressing changes.  2. Please page VAD pager for any equipment or patient needs 3. Possible transfer to 2C today   Hessie DienerMolly Reece RN, VAD Coordinator 24/7 pager [phone]

## 2017-06-21 LAB — PROTIME-INR
INR: 3.69
INR: 2.49
PROTHROMBIN TIME: 26.7 s — AB (ref 11.4–15.2)
Prothrombin Time: 36.3 seconds — ABNORMAL HIGH (ref 11.4–15.2)

## 2017-06-21 LAB — GLUCOSE, CAPILLARY
GLUCOSE-CAPILLARY: 127 mg/dL — AB (ref 65–99)
GLUCOSE-CAPILLARY: 119 mg/dL — AB (ref 65–99)
Glucose-Capillary: 118 mg/dL — ABNORMAL HIGH (ref 65–99)

## 2017-06-21 LAB — BASIC METABOLIC PANEL
Anion gap: 5 (ref 5–15)
BUN: 55 mg/dL — AB (ref 6–20)
CO2: 23 mmol/L (ref 22–32)
Calcium: 7.8 mg/dL — ABNORMAL LOW (ref 8.9–10.3)
Chloride: 122 mmol/L — ABNORMAL HIGH (ref 101–111)
Creatinine, Ser: 1.33 mg/dL — ABNORMAL HIGH (ref 0.61–1.24)
GFR calc Af Amer: >60 mL/min (ref >60)
GFR, EST NON AFRICAN AMERICAN: 59 mL/min — AB (ref >60)
GLUCOSE: 136 mg/dL — AB (ref 65–99)
POTASSIUM: 3.5 mmol/L (ref 3.5–5.1)
Sodium: 150 mmol/L — ABNORMAL HIGH (ref 135–145)

## 2017-06-21 LAB — CBC
HEMATOCRIT: 24.4 % — AB (ref 39.0–52.0)
Hemoglobin: 7.8 g/dL — ABNORMAL LOW (ref 13.0–17.0)
MCH: 29.1 pg (ref 26.0–34.0)
MCHC: 32 g/dL (ref 30.0–36.0)
MCV: 91 fL (ref 78.0–100.0)
Platelets: 131 10*3/uL — ABNORMAL LOW (ref 150–400)
RBC: 2.68 MIL/uL — ABNORMAL LOW (ref 4.22–5.81)
RDW: 21.5 % — AB (ref 11.5–15.5)
WBC: 9.7 10*3/uL (ref 4.0–10.5)

## 2017-06-21 LAB — LACTATE DEHYDROGENASE: LDH: 174 U/L (ref 98–192)

## 2017-06-21 LAB — PREPARE RBC (CROSSMATCH)

## 2017-06-21 LAB — APTT
APTT: 129 s — AB (ref 24–36)
aPTT: 115 seconds — ABNORMAL HIGH (ref 24–36)

## 2017-06-21 MED ORDER — WARFARIN SODIUM 5 MG PO TABS
5.0000 mg | ORAL_TABLET | Freq: Once | ORAL | Status: AC
  Administered 2017-06-21: 5 mg via ORAL
  Filled 2017-06-21: qty 1

## 2017-06-21 MED ORDER — FUROSEMIDE 10 MG/ML IJ SOLN
40.0000 mg | Freq: Two times a day (BID) | INTRAMUSCULAR | Status: AC
  Administered 2017-06-21 (×2): 40 mg via INTRAVENOUS
  Filled 2017-06-21 (×2): qty 4

## 2017-06-21 MED ORDER — SODIUM CHLORIDE 0.9 % IV SOLN
Freq: Once | INTRAVENOUS | Status: AC
  Administered 2017-06-21: 10 mL/h via INTRAVENOUS

## 2017-06-21 MED ORDER — POTASSIUM CHLORIDE 10 MEQ/50ML IV SOLN
10.0000 meq | INTRAVENOUS | Status: AC
  Administered 2017-06-21 (×3): 10 meq via INTRAVENOUS
  Filled 2017-06-21 (×3): qty 50

## 2017-06-21 MED ORDER — SODIUM CHLORIDE 0.9 % IV SOLN
0.0070 mg/kg/h | INTRAVENOUS | Status: DC
  Administered 2017-06-21: 0.007 mg/kg/h via INTRAVENOUS
  Filled 2017-06-21: qty 250

## 2017-06-21 NOTE — Progress Notes (Signed)
ANTICOAGULATION CONSULT NOTE - Follow Up Consult  Pharmacy Consult for Bivalirudin  Indication: HVAD  Allergies  Allergen Reactions  . Depakote [Divalproex Sodium] Rash    Developed rash which resolved once med was removed    Patient Measurements: Height: 6' (182.9 cm) Weight: 252 lb (114.3 kg) (with vad) IBW/kg (Calculated) : 77.6   Vital Signs: Temp: 100.8 F (38.2 C) (09/01 1138) Temp Source: Axillary (09/01 1138) BP: 96/63 (09/01 1351) Pulse Rate: 62 (09/01 1010)  Labs:  Recent Labs  06/19/17 0228  06/19/17 0253  06/20/17 0445 06/20/17 0547  06/20/17 1452 06/21/17 0329 06/21/17 0822 06/21/17 1353  HGB  --   < > 8.6*  --  8.0*  --   --   --  7.8*  --   --   HCT  --   --  28.2*  --  26.1*  --   --   --  24.4*  --   --   PLT  --   --  143*  --  144*  --   --   --  131*  --   --   APTT  --   --   --   < >  --  107*  < > 85* 115* 129*  --   LABPROT  --   < > 18.2*  --   --  25.9*  --   --  36.3*  --  26.7*  INR  --   < > 1.52  --   --  2.39  --   --  3.69  --  2.49  HEPARINUNFRC 0.37  --   --   --   --   --   --   --   --   --   --   CREATININE  --   --  1.22  --  1.27*  --   --   --  1.33*  --   --   < > = values in this interval not displayed.  Estimated Creatinine Clearance: 82.9 mL/min (A) (by C-G formula based on SCr of 1.33 mg/dL (H)).   Assessment: 56 y/o M HVAD pt transitioned from heparin to bivalirudin in the setting of dropping PLTS, aPTT supra-therapeutic this AM, no issues per RN.   Discussed anticoagulation plan with cardiology. Turned off bival this morning, repeat INR is at goal at 2.49 off bival. Will continue coumadin alone.  Currently no active bleeding noted. Hgb low this am at 7.8, will receive a unit of blood.  Pltc low stable 131. HIT antibody was negative.   Goal of Therapy:  INR goal 2-2.5 (GIB this admit) aPTT 50-85 seconds Monitor platelets by anticoagulation protocol: Yes   Plan:  Stop bival Continue warfarin 5mg   tonight  Sheppard CoilFrank Elaynah Virginia PharmD., BCPS Clinical Pharmacist Pager [phone] 06/21/2017 2:50 PM

## 2017-06-21 NOTE — Progress Notes (Signed)
ANTICOAGULATION CONSULT NOTE - Follow Up Consult  Pharmacy Consult for Bivalirudin  Indication: HVAD  Allergies  Allergen Reactions  . 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    Patient Measurements: Height: 6' (182.9 cm) Weight: 238 lb (108 kg) IBW/kg (Calculated) : 77.6   Vital Signs: Temp: 97.8 F (36.6 C) (09/01 0347) Temp Source: Oral (09/01 0347) BP: 89/67 (09/01 0400) Pulse Rate: 69 (09/01 0400)  Labs:  Recent Labs  06/18/17 [id number] 06/19/17 0228  06/19/17 0253  06/20/17 0445 06/20/17 0547 06/20/17 1013 06/20/17 1452 06/21/17 0329  HGB  --   --   < > 8.6*  --  8.0*  --   --   --  7.8*  HCT  --   --   --  28.2*  --  26.1*  --   --   --  24.4*  PLT  --   --   --  143*  --  144*  --   --   --  131*  APTT  --   --   --   --   < >  --  107* 86* 85* 115*  LABPROT 17.1*  --   --  18.2*  --   --  25.9*  --   --  36.3*  INR 1.41  --   --  1.52  --   --  2.39  --   --  3.69  HEPARINUNFRC 0.27* 0.37  --   --   --   --   --   --   --   --   CREATININE  --   --   --  1.22  --  1.27*  --   --   --  1.33*  < > = values in this interval not displayed.  Estimated Creatinine Clearance: 80.6 mL/min (A) (by C-G formula based on SCr of 1.33 mg/dL (H)).   Assessment: 56 y/o M HVAD pt transitioned from heparin to bivalirudin in the setting of dropping PLTS, aPTT supra-therapeutic this AM, no issues per RN.   Goal of Therapy:  aPTT 50-85 seconds Monitor platelets by anticoagulation protocol: Yes   Plan:  -Hold infusion x 1 hr -Re-start bivalirudin at 0.007 mg/kg/hr at 0500 -Check 0900 aPTT  Abran DukeLedford, Alysa Duca 06/21/2017,4:15 AM

## 2017-06-21 NOTE — Progress Notes (Signed)
[name] Fichera called and updated on pt transfer to 2C05.

## 2017-06-21 NOTE — Progress Notes (Signed)
Physical Therapy Wound Treatment Patient Details  Name: MCIHAEL HINDERMAN MRN: [id number] Date of Birth: 05/08/1961  Today's Date: 06/21/2017 Time: 7893-8101 Time Calculation (min): 35 min  Subjective  Subjective: Patient reporting pain in hands and sacrum  Pain Score: Pain Score: 7   Wound Assessment  Pressure Injury 05/14/17 Unstageable - Full thickness tissue loss in which the base of the ulcer is covered by slough (yellow, tan, gray, green or brown) and/or eschar (tan, brown or black) in the wound bed. WOC updated, wound evolution to unstageable  (Active)  Dressing Type Moist to dry;Tape dressing;Foam;Gauze (Comment) 06/21/2017  9:37 AM  Dressing Clean;Dry;Intact 06/21/2017  9:37 AM  Dressing Change Frequency Daily 06/21/2017  9:37 AM  State of Healing Eschar 06/21/2017  9:37 AM  Site / Wound Assessment Yellow;Pink;Granulation tissue 06/21/2017  9:37 AM  % Wound base Red or Granulating 25% 06/21/2017  9:37 AM  % Wound base Yellow/Fibrinous Exudate 75% 06/21/2017  9:37 AM  % Wound base Black/Eschar 0% 06/21/2017  9:37 AM  % Wound base Other/Granulation Tissue (Comment) 0% 06/21/2017  9:37 AM  Peri-wound Assessment Intact;Erythema (blanchable) 06/21/2017  9:37 AM  Wound Length (cm) 6.5 cm 06/20/2017  3:03 PM  Wound Width (cm) 8 cm 06/20/2017  3:03 PM  Wound Depth (cm) 3.6 cm 06/20/2017  3:03 PM  Tunneling (cm) 0 06/16/2017  8:00 PM  Undermining (cm) 5.0 cm 4:00-6:00, 2.6 cm at 12:00. 2.0 cm 10:00-12:00 06/20/2017  3:03 PM  Margins Unattached edges (unapproximated) 06/21/2017  9:37 AM  Drainage Amount Minimal 06/21/2017  9:37 AM  Drainage Description Serosanguineous;No odor 06/21/2017  9:37 AM  Treatment Hydrotherapy (Pulse lavage);Packing (Saline gauze) 06/21/2017  9:37 AM      Hydrotherapy Pulsed lavage therapy - wound location: sacral Pulsed Lavage with Suction (psi): 12 psi Pulsed Lavage with Suction - Normal Saline Used: 1000 mL Pulsed Lavage Tip: Tip with splash shield   Wound Assessment and Plan  Wound  Therapy - Assess/Plan/Recommendations Wound Therapy - Clinical Statement: Wound bed continues to be covered in yellow slough/unviable tissue however overall quality of tissue appears to be improving. Pt will benefit from continued hydrotherapy for the pulsed lavage that will help cleansed wound, decrease bacterial load and otherwise prep wound for selective debridement. Wound Therapy - Functional Problem List: Relative immobility, decreased strength, compromised nutrition and skin integrity.  See PT notes Factors Delaying/Impairing Wound Healing: Incontinence;Infection - systemic/local;Immobility;Multiple medical problems Hydrotherapy Plan: Debridement;Dressing change;Patient/family education;Pulsatile lavage with suction Wound Therapy - Frequency: 6X / week Wound Therapy - Current Recommendations: PT;Case manager/social work;WOC nurse Wound Therapy - Follow Up Recommendations: Home health RN;Skilled nursing facility Wound Plan: see above  Wound Therapy Goals- Improve the function of patient's integumentary system by progressing the wound(s) through the phases of wound healing (inflammation - proliferation - remodeling) by: Decrease Necrotic Tissue to: 25 % Decrease Necrotic Tissue - Progress: Progressing toward goal Increase Granulation Tissue to: 75% including any viable tissues Increase Granulation Tissue - Progress: Progressing toward goal Improve Drainage Characteristics: Min;Serous Improve Drainage Characteristics - Progress: Progressing toward goal  Goals will be updated until maximal potential achieved or discharge criteria met.  Discharge criteria: when goals achieved, discharge from hospital, MD decision/surgical intervention, no progress towards goals, refusal/missing three consecutive treatments without notification or medical reason.  GP     Shanna Cisco 06/21/2017, 9:46 AM   06/21/2017 [place], [place]

## 2017-06-21 NOTE — Progress Notes (Signed)
Orthopedic Tech Progress Note Patient Details:  Edd FabianHarry D Troiano 1960-11-15 [id number]  Ortho Devices Type of Ortho Device: Roland RackUnna boot Ortho Device/Splint Location: bilateral Ortho Device/Splint Interventions: Application   Miangel Flom 06/21/2017, 11:03 AM As ordered by Dr. Shirlee LatchMclean

## 2017-06-21 NOTE — Progress Notes (Signed)
Patient ID: James [name], male   DOB: [birth year], 57 y.o.   MRN: [id number]   Advanced Heart Failure VAD Team Note  Subjective:    Events: --HVAD placed 6/28.  Returned to the OR that evening with high chest tube output, evacuation of mediastinal hematoma.  --Extubated 6/29. Milrinone stopped on 7/4. --7/4, patient developed ileus with respiratory compromise. NGT placed with 3 L suctioned out.  CXR with suspicion for aspiration PNA.  Patient had to be intubated.  He went into atrial fibrillation with RVR.  He became hypotensive and was started on norepinephrine and phenylephrine.  Amiodarone gtt begun.    --Extubated again on 7/6.  -- 7/13 Had 2 sustained episodes of VT. Had to be cardioverted 1. Second episode broke with overdrive pacing with Dr. Caryl Comes. VAD speed turned down to 2700.  --Melena due to acuteGI bleed --> 2 units PRBCs 7/14, 2 units 7/15. 3 U PRBCs 7/16,  05/01/2017 3 UPRBCs.  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  He had epinephrine, APC, and 2 clips. Intubated prior to procedure and placed on norepinephrine. Speed dropped to 2660.  --7/18 Back in VT with overdrive pacing. Lidocaine was started in addition to amiodarone. Extubated. --7/19 lidocaine stopped with confusion. Neuro consulted.  EEG normal.  CT of head no acute findings. 7/20 confusion had resolved.  --1 unit PRBCs 7/20.  --7/22, developed sustained VT with rate around 130 shortly after getting IV Lasix.  We were able to pace him out and back to NSR.  He was put back on amiodarone gtt.  --More VT on 7/25, paced out.  Back on IV amiodarone gtt.  NSR.  -- 7/31 Ramp ECHO decrease speed 2600 rpm.  -- 8/1 VT- paced out -- 8/2 VT - started on sotalol -- 8/6 Hgb 7.5, 1 unit PRBCs -- 8/7 Hgb 9.5, confused.  Head CT with right superior cerebellar artery lacunar infarcts, not acute. -- 8/8 Possible aspiration. Reintubated. -- 8/10 possible seizure => Depakote.  CT head no  change.  1 unit PRBCs.  -- Multiple suction alarms on 8/17 night which persisted so suction alarm deactivated.  -- Extubated 8/23  MAP better 70s-80s.  Got up to chair yesterday but today has significant pain at decubitus ulcer site.   Remains NPO due to pharyngeal muscle weakness.  CVP 11 this morning.   Remains on bivalirudin gtt + warfarin.  Platelet fall has stabilized.  INR to 3.69 (on bivalirudin) this morning.  HIT negative (lab back today).   Getting hydrotherapy.   Sodium 155 => 153 => 153 => 153 => 150  HVAD INTERROGATION:  HVAD:  Flow 4.4 liters/min, speed 2450,  power 3.5 W,  Peak 7 Trough 3. Suction alarms off. [place] on.   Objective:    Vital Signs:   Temp:  [97.6 F (36.4 C)-98.6 F (37 C)] 97.8 F (36.6 C) (09/01 0347) Pulse Rate:  [60-80] 72 (09/01 0700) Resp:  [12-33] 27 (09/01 0700) BP: (83-115)/(52-87) 93/68 (09/01 0700) SpO2:  [95 %-100 %] 95 % (09/01 0700) Weight:  [238 lb (108 kg)-252 lb (114.3 kg)] 252 lb (114.3 kg) (09/01 0530) Last BM Date: 06/20/17 Mean arterial Pressure 70s-80s Intake/Output:   Intake/Output Summary (Last 24 hours) at 06/21/17 0826 Last data filed at 06/21/17 0700  Gross per 24 hour  Intake          3672.15 ml  Output             1415  ml  Net          2257.15 ml     Physical Exam    Physical Exam: CVP 11 GENERAL: Fatigued and chronically ill appearing. NAD.  HEENT: Normal. NECK: Supple, JVP 10 cm. Carotids OK.  CARDIAC: Mechanical heart sounds with LVAD hum present.  LUNGS: CTAB, normal effort.  ABDOMEN: NT, ND, no HSM. No bruits or masses. +BS LVAD exit site: Dressing dry and intact. No erythema or drainage. Stabilization device present and accurately applied. Driveline dressing changed daily per sterile technique. EXTREMITIES: Warm and dry. No cyanosis or clubbing. Diffuse gouty abnormalities. LEs wrapped. 1+ edema into thighs.  NEUROLOGIC: Alert &oriented x 3. Cranial nerves grossly intact. Moves all 4  extremities w/o difficulty. Affect flat but appropriate.  Telemetry   Personally reviewed, NSR 60s with occasional pacing  Labs   Basic Metabolic Panel:  Recent Labs Lab 06/17/17 0232 06/18/17 0400 06/19/17 0253 06/20/17 0445 06/21/17 0329  NA 155* 153* 153* 153* 150*  K 3.4* 3.2* 3.2* 3.4* 3.5  CL 123* 122* 124* 123* 122*  CO2 25 26 24 24 23   GLUCOSE 149* 163* 139* 169* 136*  BUN 52* 48* 51* 50* 55*  CREATININE 1.23 1.17 1.22 1.27* 1.33*  CALCIUM 8.4* 8.0* 8.1* 8.0* 7.8*  MG  --  1.6*  --   --   --     Liver Function Tests: No results for input(s): AST, ALT, ALKPHOS, BILITOT, PROT, ALBUMIN in the last 168 hours. No results for input(s): LIPASE, AMYLASE in the last 168 hours. No results for input(s): AMMONIA in the last 168 hours.  CBC:  Recent Labs Lab 06/17/17 0232 06/18/17 0400 06/19/17 0253 06/20/17 0445 06/21/17 0329  WBC 10.0 8.4 8.9 10.6* 9.7  HGB 9.4* 8.7* 8.6* 8.0* 7.8*  HCT 30.8* 27.4* 28.2* 26.1* 24.4*  MCV 93.1 92.6 93.4 93.5 91.0  PLT 218 174 143* 144* 131*    INR:  Recent Labs Lab 06/17/17 0232 06/18/17 0452 06/19/17 0253 06/20/17 0547 06/21/17 0329  INR 1.46 1.41 1.52 2.39 3.69   Other results:    Imaging   No results found.  Medications:    Scheduled Medications: . amiodarone  200 mg Per Tube Daily  . amLODipine  10 mg Oral BID  . chlorhexidine gluconate (MEDLINE KIT)  15 mL Mouth Rinse BID  . Chlorhexidine Gluconate Cloth  6 each Topical q morning - 10a  . collagenase   Topical Daily  . doxazosin  4 mg Oral Q12H  . febuxostat  120 mg Oral Daily  . feeding supplement (NEPRO CARB STEADY)  1,000 mL Per Tube Q24H  . feeding supplement (PRO-STAT SUGAR FREE 64)  60 mL Per Tube BID  . free water  400 mL Per Tube Q4H  . furosemide  40 mg Intravenous BID  . hydrALAZINE  100 mg Oral Q8H  . insulin aspart  0-15 Units Subcutaneous Q4H  . levETIRAcetam  500 mg Per Tube BID  . levothyroxine  50 mcg Per NG tube QAC breakfast  .  magnesium oxide  400 mg Per Tube Daily  . mouth rinse  15 mL Mouth Rinse Q4H  . mexiletine  300 mg Per Tube Q12H  . pantoprazole sodium  40 mg Per Tube BID  . potassium chloride  20 mEq Per Tube Once  . sodium chloride flush  10-40 mL Intracatheter Q12H  . sotalol  80 mg Per Tube Daily  . spironolactone  25 mg Oral Daily  . Warfarin - Pharmacist  Dosing Inpatient   Does not apply q1800    Infusions: . sodium chloride 10 mL/hr at 06/21/17 0347  . sodium chloride    . bivalirudin (ANGIOMAX) infusion 0.5 mg/mL (Non-ACS indications) 0.007 mg/kg/hr (06/21/17 0526)    PRN Medications: sodium chloride, acetaminophen (TYLENOL) oral liquid 160 mg/5 mL, albuterol, docusate, fentaNYL (SUBLIMAZE) injection, Gerhardt's butt cream, hydrALAZINE, midazolam, morphine injection, morphine, neomycin-bacitracin-polymyxin, ondansetron (ZOFRAN) IV, oxyCODONE, pneumococcal 23 valent vaccine, silver nitrate applicators, sodium chloride flush   Patient Profile   57 yo with CAD s/p CABG, ischemic cardiomyopathy/chronic systolic CHF, tophaceous gout, and CKD stage 3 was admitted for diuresis and consideration for LVAD placement. S/p HVAD on 6/28  Assessment/Plan:    1. Acute/chronic systolic CHF s/p HVAD: Ischemic cardiomyopathy.  [employer] Jude ICD.  Echo (6/18) with EF 15%, mildly dilated RV with moderately decreased systolic function.  s/p HVAD placement 6/28 and had to return to OR to evacuate mediastinal hematoma.  He had been weaned off pressors/milrinone, but developed ileus w/ likely aspiration event 7/4, re-intubated, developed afib/RVR requiring norepinephrine. Extubated 7/6.  VAD speed turned down to 2540 with VT. VAD parameters currently look good.  Milrinone stopped 7/17 with recurrent VT.  He was started back on norepinephrine 8/9 due to soft BP after intubation and suspected septic shock, now off norepinephrine.  MAP finally improved to goal, getting hydralazine per tube as well as amlodipine, doxazosin, and  spironolactone.  Flow higher with improved BP. He has had massive 3rd spacing.  Legs now wrapped.  Today, CVP 11 on my measure.  - Hemoglobin down to 7.8, will give 1 unit PRBCs with Lasix 40 mg IV x 2 doses.  - Wrap legs to knees.  - Continue antihypertensive regimen.    - Off ASA with GI Bleed  - Goal INR 2-2.5, INR 3.69 today but on bivalirudin.  Stop bivalirudin and check INR, hopefully therapeutic (does not need overlap if INR > 1.8).    2. AKI on CKD stage 3: Creatinine stable.  3. Symptomatic anemia due to acute UGI bleeding:  Received 3 units PRBCs 7/16 and 3 units PRBCs on 05/03/2017. 1 unit PRBCs 7/20. 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  Requiring 2 clips + epi + APC. 1 unit PRBCs 8/6.  He had 1 unit PRBCs 8/10 to provide volume.  1 unit PRBCs on 8/22.  No overt bleeding.   - Got Feraheme 7/28.  - Continue Protonix 40 po bid. - Transfuse hgb < 8 => will give 1 unit today with IV Lasix.   4. Ventricular tachycardia: Status post ventricular tachycardia 2 on 7/13. Possible suction event. VAD speed turned down to 2700.  Recurrent VT 7/17. EP called to bedside. Overdrive pacing successful for about 10 minutes but VT recurred.  Milrinone turned off, remained in VT overnight but hemodynamically stable.  Speed decreased to 2600.  On 7/18, we were able to pace him out of VT.  Lidocaine added then stopped due to confusion/mental status changes.  He had VT 7/22 about 30 minutes after getting IV Lasix, had to be paced out again => may have been due to rapid fluid shift.  Pt paced out of VT 3 separate occasions 7/25. Got amio bolus x 2 and started back on IV infusion.  Transitioned to amiodarone 400 mg tid.  On 8/1 had recurrent VT and paced out again.  Recurrent VT 8/2 per EP sotalol started along with amiodarone, sotalol decreased to once daily with bradycardia.  Remains quiescent.  - Continue mexiletine to 300 mg BID, sotalol 80 mg daily.  - Continue amiodarone  200 mg daily.  - Keep K > 4.0 Mg > 2.0 => give Mg today.  5. Paroxysmal Atrial fibrillation:  Developed atrial fibrillation with RVR in setting of aspiration PNA on 7/4.  In NSR currently on triple AA therapy. No change.  6. Malnutrition:  Continue tube feeds via Cor-track. Nutrition following. No change.  7. Aspiration PNA with acute hypoxemic respiratory failure on 7/4: He was extubated on 7/6. Tracheal aspirate with Klebsiella and citrobacter. Both sensitive to Zosyn. Completed abx 7/13. Possible aspiration 8/8. Treated with meropenem. Stopped 8/24 - Off abx. No change.  - He remains NPO, everything via Cor-track.  May end up needing PEG.  8. Acute Respiratory Failure: Intubated 7/17 in setting of complex GI intervention. Extubated 7/18. Re-intubated 8/8.  Weak respiratory muscles. He has not wanted a tracheostomy.  Extubated on 8/22.   - Remains stable off vent.  9. CAD: s/p CABG 2012. No chest pain.  Off aspirin due to GI bleeding. No change.  10. Gout: Severe tophaceous gout.  - Continue uloric. Stopped colchicine with diarrhea. Flare now resolved. Can stop prednisone.  11. Delirium: Appreciate neuro input Possibly related to lidocaine.  Lidocaine stopped. CT of head negative. EEG ok. No further workup per neuro. Neuro  reconsulted 8/7 with AMS => ?related to fever/infection.  He has been more clear on vent.  CT head 05/27/17 and 8/10 with lacunar infarcts but do not appear acute.  Seizure activity versus myoclonus => Depakote started.  Switched to [place] with ?drug rash from Depakote.   - Currently resolved. 12. UTI: Treated/resolved.   13. Unstageable Pressure Ulcer: Sacrum. Santyl daily to enzymatically debride. Reposition R to L.  - Continue with hydrotherapy. No change. Appreciate WOC care.  14. Bradycardia: With hypotension.  Currently back-up pacing set at 60 bpm.  Does not have atrial lead so pacing asynchronously. Currently pacing only rarely.    15. Hypothyroidism: May be related to  amiodarone, he is on Levoxyl. No change.    16. ID: See above - Meropenem stopped 8/24 . No change.  18. Severe Deconditioning: Critical care myopathy. Continue PT. CIR MD saw, not yet appropriate.  19. Hypernatremia: sodium 150, slow down trend. Continue free water boluses 400 q4 hrs. 20. Thrombocytopenia: Mild but plts dropped 50% over a few days. HIT negative.  Holding bivalirudin today, if INR < 1.8 can go back on heparin gtt.  21. Pharyngeal muscle weakness: Remains NPO with tube feeds.  Speech/swallow following.   We will not be able to get him to CIR.  Will explore LTAC.  May need to wait a few more days with more PT and reassess for CIR.   I reviewed the HVAD parameters from today, and compared the results to the patient's prior recorded data.  No programming changes were made.  The HVAD is functioning within specified parameters.    Loralie Champagne, MD 06/21/2017, 8:26 AM  VAD Team --- VAD ISSUES ONLY--- Pager [phone] (7am - 7am) Advanced Heart Failure Team  Pager [phone] (M-F; 7a - 4p)  Please contact [facility] for night-coverage after hours (4p -7a ) and weekends on [email or web address]

## 2017-06-22 ENCOUNTER — Other Ambulatory Visit: Payer: Self-pay

## 2017-06-22 ENCOUNTER — Inpatient Hospital Stay (HOSPITAL_COMMUNITY): Payer: Medicare PPO

## 2017-06-22 DIAGNOSIS — I5023 Acute on chronic systolic (congestive) heart failure: Secondary | ICD-10-CM

## 2017-06-22 DIAGNOSIS — Z95811 Presence of heart assist device: Secondary | ICD-10-CM

## 2017-06-22 LAB — BASIC METABOLIC PANEL
Anion gap: 5 (ref 5–15)
BUN: 64 mg/dL — AB (ref 6–20)
CO2: 24 mmol/L (ref 22–32)
CREATININE: 1.51 mg/dL — AB (ref 0.61–1.24)
Calcium: 7.7 mg/dL — ABNORMAL LOW (ref 8.9–10.3)
Chloride: 118 mmol/L — ABNORMAL HIGH (ref 101–111)
GFR calc Af Amer: 59 mL/min — ABNORMAL LOW (ref >60)
GFR, EST NON AFRICAN AMERICAN: 51 mL/min — AB (ref >60)
GLUCOSE: 165 mg/dL — AB (ref 65–99)
POTASSIUM: 3.7 mmol/L (ref 3.5–5.1)
SODIUM: 147 mmol/L — AB (ref 135–145)

## 2017-06-22 LAB — TYPE AND SCREEN
ABO/RH(D): A POS
Antibody Screen: NEGATIVE
UNIT DIVISION: 0

## 2017-06-22 LAB — GLUCOSE, CAPILLARY
GLUCOSE-CAPILLARY: 131 mg/dL — AB (ref 65–99)
GLUCOSE-CAPILLARY: 134 mg/dL — AB (ref 65–99)
GLUCOSE-CAPILLARY: 138 mg/dL — AB (ref 65–99)
GLUCOSE-CAPILLARY: 153 mg/dL — AB (ref 65–99)
Glucose-Capillary: 114 mg/dL — ABNORMAL HIGH (ref 65–99)
Glucose-Capillary: 126 mg/dL — ABNORMAL HIGH (ref 65–99)
Glucose-Capillary: 139 mg/dL — ABNORMAL HIGH (ref 65–99)
Glucose-Capillary: 144 mg/dL — ABNORMAL HIGH (ref 65–99)
Glucose-Capillary: 144 mg/dL — ABNORMAL HIGH (ref 65–99)
Glucose-Capillary: 155 mg/dL — ABNORMAL HIGH (ref 65–99)

## 2017-06-22 LAB — BPAM RBC
Blood Product Expiration Date: 201809202359
ISSUE DATE / TIME: 201809010959
Unit Type and Rh: 6200

## 2017-06-22 LAB — CBC
HEMATOCRIT: 25.6 % — AB (ref 39.0–52.0)
Hemoglobin: 8.1 g/dL — ABNORMAL LOW (ref 13.0–17.0)
MCH: 28.4 pg (ref 26.0–34.0)
MCHC: 31.6 g/dL (ref 30.0–36.0)
MCV: 89.8 fL (ref 78.0–100.0)
PLATELETS: 140 10*3/uL — AB (ref 150–400)
RBC: 2.85 MIL/uL — ABNORMAL LOW (ref 4.22–5.81)
RDW: 21.1 % — AB (ref 11.5–15.5)
WBC: 12.5 10*3/uL — AB (ref 4.0–10.5)

## 2017-06-22 LAB — MAGNESIUM: Magnesium: 1.8 mg/dL (ref 1.7–2.4)

## 2017-06-22 LAB — PROTIME-INR
INR: 2.83
PROTHROMBIN TIME: 29.5 s — AB (ref 11.4–15.2)

## 2017-06-22 LAB — LACTATE DEHYDROGENASE: LDH: 166 U/L (ref 98–192)

## 2017-06-22 MED ORDER — FUROSEMIDE 40 MG PO TABS
40.0000 mg | ORAL_TABLET | Freq: Every day | ORAL | Status: DC
  Administered 2017-06-22 – 2017-06-23 (×2): 40 mg via ORAL
  Filled 2017-06-22 (×2): qty 1

## 2017-06-22 MED ORDER — WARFARIN SODIUM 3 MG PO TABS
3.0000 mg | ORAL_TABLET | Freq: Once | ORAL | Status: AC
  Administered 2017-06-22: 3 mg via ORAL
  Filled 2017-06-22: qty 1

## 2017-06-22 MED ORDER — POTASSIUM CHLORIDE 20 MEQ PO PACK
20.0000 meq | PACK | Freq: Once | ORAL | Status: AC
  Administered 2017-06-22: 20 meq via ORAL
  Filled 2017-06-22: qty 1

## 2017-06-22 NOTE — Progress Notes (Signed)
Pt's spouse states that pt is different today than he was for her on Thursday when she last saw him, she states he is less awake and reponsive. Pt wakes to voice, O2 sats on room air are greater than 90%. Pt last given fentanyl at 1030 for pain in bilateral hands. Spouse asked me to notify MD. I paged Dr Shirlee LatchMclean, waiting for call back. I also spoke with Arther DamesLeslie RN the VAD Coordinator. She stated that pt is normally lethargic and that he would wake to voice the last time she saw him. Will continue to monitor

## 2017-06-22 NOTE — Progress Notes (Signed)
Patient ID: James [name], male   DOB: October 21, 1961, 56 y.o.   MRN: [id number] HVAD Rounding Note  Subjective:    He is not as alert today and gurgling with breathing. Weak cough.   LVAD INTERROGATION:  HVAD:  Flow 4.7 liters/min, speed 2450, power 3.7, peak 7, trough 3  Objective:    Vital Signs:   Temp:  [98.4 F (36.9 C)-99.8 F (37.7 C)] 98.4 F (36.9 C) (09/02 0744) Pulse Rate:  [31-78] 31 (09/02 0744) Resp:  [20-29] 29 (09/02 0744) BP: (83-103)/(61-84) 92/74 (09/02 0744) SpO2:  [92 %-100 %] 96 % (09/02 0744) Weight:  [110.2 kg (243 lb)] 110.2 kg (243 lb) (09/02 0327) Last BM Date: 06/22/17 Mean arterial Pressure 70-80's  Intake/Output:   Intake/Output Summary (Last 24 hours) at 06/22/17 1153 Last data filed at 06/22/17 1100  Gross per 24 hour  Intake          3606.67 ml  Output              956 ml  Net          2650.67 ml     Physical Exam: General:  Looks weak and chronically ill, gurgling HEENT: feeding tube in place Cor: distantheart sounds with LVAD hum present. Lungs: rhonchi bilat Abdomen: soft, nontender, nondistended.  Good bowel sounds. Extremities: mild thigh edema, Unna boots on lower legs Neuro: less alert today  Telemetry: NSR with occ pacing  Labs: Basic Metabolic Panel:  Recent Labs Lab 06/18/17 0400 06/19/17 0253 06/20/17 0445 06/21/17 0329 06/22/17 0253  NA 153* 153* 153* 150* 147*  K 3.2* 3.2* 3.4* 3.5 3.7  CL 122* 124* 123* 122* 118*  CO2 26 24 24 23 24   GLUCOSE 163* 139* 169* 136* 165*  BUN 48* 51* 50* 55* 64*  CREATININE 1.17 1.22 1.27* 1.33* 1.51*  CALCIUM 8.0* 8.1* 8.0* 7.8* 7.7*  MG 1.6*  --   --   --  1.8    Liver Function Tests: No results for input(s): AST, ALT, ALKPHOS, BILITOT, PROT, ALBUMIN in the last 168 hours. No results for input(s): LIPASE, AMYLASE in the last 168 hours. No results for input(s): AMMONIA in the last 168 hours.  CBC:  Recent Labs Lab 06/18/17 0400 06/19/17 0253 06/20/17 0445  06/21/17 0329 06/22/17 0253  WBC 8.4 8.9 10.6* 9.7 12.5*  HGB 8.7* 8.6* 8.0* 7.8* 8.1*  HCT 27.4* 28.2* 26.1* 24.4* 25.6*  MCV 92.6 93.4 93.5 91.0 89.8  PLT 174 143* 144* 131* 140*    INR:  Recent Labs Lab 06/19/17 0253 06/20/17 0547 06/21/17 0329 06/21/17 1353 06/22/17 0253  INR 1.52 2.39 3.69 2.49 2.83    Other results:  EKG:   Imaging:  No results found.   Medications:     Scheduled Medications: . amiodarone  200 mg Per Tube Daily  . amLODipine  10 mg Oral BID  . chlorhexidine gluconate (MEDLINE KIT)  15 mL Mouth Rinse BID  . Chlorhexidine Gluconate Cloth  6 each Topical q morning - 10a  . collagenase   Topical Daily  . doxazosin  4 mg Oral Q12H  . febuxostat  120 mg Oral Daily  . feeding supplement (NEPRO CARB STEADY)  1,000 mL Per Tube Q24H  . feeding supplement (PRO-STAT SUGAR FREE 64)  60 mL Per Tube BID  . free water  400 mL Per Tube Q4H  . furosemide  40 mg Oral Daily  . hydrALAZINE  100 mg Oral Q8H  . insulin aspart  0-15  Units Subcutaneous Q4H  . levETIRAcetam  500 mg Per Tube BID  . levothyroxine  50 mcg Per NG tube QAC breakfast  . magnesium oxide  400 mg Per Tube Daily  . mouth rinse  15 mL Mouth Rinse Q4H  . mexiletine  300 mg Per Tube Q12H  . pantoprazole sodium  40 mg Per Tube BID  . potassium chloride  20 mEq Oral Once  . sodium chloride flush  10-40 mL Intracatheter Q12H  . sotalol  80 mg Per Tube Daily  . spironolactone  25 mg Oral Daily  . warfarin  3 mg Oral ONCE-1800  . Warfarin - Pharmacist Dosing Inpatient   Does not apply q1800     Infusions: . sodium chloride 10 mL/hr at 06/22/17 1100     PRN Medications:  sodium chloride, acetaminophen (TYLENOL) oral liquid 160 mg/5 mL, albuterol, docusate, fentaNYL (SUBLIMAZE) injection, Gerhardt's butt cream, hydrALAZINE, neomycin-bacitracin-polymyxin, ondansetron (ZOFRAN) IV, oxyCODONE, pneumococcal 23 valent vaccine, silver nitrate applicators, sodium chloride flush  1. POD 66s/p  HVAD for ischemic cardiomyopathy with acute on chronic systolic heart failure, EF 15% with moderate RV dysfunction. Returned to OR for bleeding from raw mediastinal tissues. His VAD parameters have been stable. MAP is good.  2. Acute mental status changes of unclear etiology. This could have beenrelated to infection, medications, hypothyroidism, possibly stroke not seenon CT. CT head from 8/7 showed lacunar infarcts that did not appear acute. Seen by neurology. EEG didnot show seizure activity. Treated infection and mental status improved. Lethargy now likely due to generalized weakness.   3. Acute respiratory failure: reintubated for airway protection. He has done fairly well but has had trouble coughing up his secretions for the past day or so. Will NT suction as needed. Check CXR this am. Continue IS, OOB to chair.  4. Acute on chronic kidney failure. Creat up slightly today.Urine output ok. Hypernatremia and getting free water.  5. GI bleeding and anemia: does not appear to be bleeding at this time.   6. VT: None since Sotalol started in addition to mexiletine and amiodarone.   7. Fever and Leukocytosis. Nothing cultured.low grade temps to 99 and WBC count 12off antibiotics.  8. Sacral pressure sore: Continue present treatment with hydrotherapy.  9. Nutrition: tolerating tube feeds at goal.   10. Hypernatremia:receiving free water boluses.  11. Anticoagulation: on heparin 1600 units per hour with INR 2.83.  Continue Coumadin per tube.    I reviewed the LVAD parameters from today, and compared the results to the patient's prior recorded data. No programming changes were made. The LVAD is functioning within specified parameters. LVAD interrogation was negative for any significant power changes, alarms or PI events/speed drops. LVAD equipment check completed andis in good working order. Back-up equipment present.    Length of Stay: [place] 06/22/2017, 11:53 AM

## 2017-06-22 NOTE — Progress Notes (Signed)
Patient ID: James [name], male   DOB: 05-12-1961, 56 y.o.   MRN: [id number]   Advanced Heart Failure VAD Team Note  Subjective:    Events: --HVAD placed 6/28.  Returned to the OR that evening with high chest tube output, evacuation of mediastinal hematoma.  --Extubated 6/29. Milrinone stopped on 7/4. --7/4, patient developed ileus with respiratory compromise. NGT placed with 3 L suctioned out.  CXR with suspicion for aspiration PNA.  Patient had to be intubated.  He went into atrial fibrillation with RVR.  He became hypotensive and was started on norepinephrine and phenylephrine.  Amiodarone gtt begun.    --Extubated again on 7/6.  -- 7/13 Had 2 sustained episodes of VT. Had to be cardioverted 1. Second episode broke with overdrive pacing with Dr. Caryl Comes. VAD speed turned down to 2700.  --Melena due to acute GI bleed --> 2 units PRBCs 7/14, 2 units 7/15. 3 U PRBCs 7/16,  04/20/2017 3 UPRBCs.  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  He had epinephrine, APC, and 2 clips. Intubated prior to procedure and placed on norepinephrine. Speed dropped to 2660.  --7/18 Back in VT with overdrive pacing. Lidocaine was started in addition to amiodarone. Extubated. --7/19 lidocaine stopped with confusion. Neuro consulted.  EEG normal.  CT of head no acute findings. 7/20 confusion had resolved.  --1 unit PRBCs 7/20.  --7/22, developed sustained VT with rate around 130 shortly after getting IV Lasix.  We were able to pace him out and back to NSR.  He was put back on amiodarone gtt.  --More VT on 7/25, paced out.  Back on IV amiodarone gtt.  NSR.  -- 7/31 Ramp ECHO decrease speed 2600 rpm.  -- 8/1 VT- paced out -- 8/2 VT - started on sotalol -- 8/6 Hgb 7.5, 1 unit PRBCs -- 8/7 Hgb 9.5, confused.  Head CT with right superior cerebellar artery lacunar infarcts, not acute. -- 8/8 Possible aspiration. Reintubated. -- 8/10 possible seizure => Depakote.  CT head no  change.  1 unit PRBCs.  -- Multiple suction alarms on 8/17 night which persisted so suction alarm deactivated.  -- Extubated 8/23 -- 9/1 got 1 unit PRBCs  MAP 70s-80s.  Remains NPO due to pharyngeal muscle weakness.  CVP 8-9 this morning.   HIT negative, plts stabilized.  INR therapeutic and off bivalirudin.   Getting hydrotherapy.   Sodium 155 => 153 => 153 => 153 => 150 => 147  HVAD INTERROGATION:  HVAD:  Flow 4.7 liters/min, speed 2450,  power 3.7 W,  Peak 7 Trough 3. Suction alarms off. Lavare on.   Objective:    Vital Signs:   Temp:  [98.4 F (36.9 C)-100.8 F (38.2 C)] 98.4 F (36.9 C) (09/02 0744) Pulse Rate:  [31-78] 31 (09/02 0744) Resp:  [20-30] 29 (09/02 0744) BP: (75-107)/(61-87) 92/74 (09/02 0744) SpO2:  [89 %-100 %] 96 % (09/02 0744) Weight:  [243 lb (110.2 kg)] 243 lb (110.2 kg) (09/02 0327) Last BM Date: 06/20/17 Mean arterial Pressure 70s-80s Intake/Output:   Intake/Output Summary (Last 24 hours) at 06/22/17 0858 Last data filed at 06/22/17 0800  Gross per 24 hour  Intake          3326.67 ml  Output             1076 ml  Net          2250.67 ml     Physical Exam    Physical Exam: CVP  8-9 GENERAL: Fatigued and chronically ill appearing. NAD.  HEENT: Normal. NECK: Supple, JVP 10cm. Carotids OK.  CARDIAC: Mechanical heart sounds with LVAD hum present.  LUNGS: CTAB, normal effort.  ABDOMEN: NT, ND, no HSM. No bruits or masses. +BS LVAD exit site: Dressing dry and intact. No erythema or drainage. Stabilization device present and accurately applied. Driveline dressing changed daily per sterile technique. EXTREMITIES: Warm and dry. No cyanosis or clubbing. Diffuse gouty abnormalities. LEs wrapped. 1+ edema into thighs.  NEUROLOGIC: Alert &oriented x 3. Cranial nerves grossly intact. Moves all 4 extremities w/o difficulty. Affect flat but appropriate.  Telemetry   Personally reviewed, NSR 60s with occasional pacing  Labs   Basic Metabolic  Panel:  Recent Labs Lab 06/18/17 0400 06/19/17 0253 06/20/17 0445 06/21/17 0329 06/22/17 0253  NA 153* 153* 153* 150* 147*  K 3.2* 3.2* 3.4* 3.5 3.7  CL 122* 124* 123* 122* 118*  CO2 26 24 24 23 24   GLUCOSE 163* 139* 169* 136* 165*  BUN 48* 51* 50* 55* 64*  CREATININE 1.17 1.22 1.27* 1.33* 1.51*  CALCIUM 8.0* 8.1* 8.0* 7.8* 7.7*  MG 1.6*  --   --   --  1.8    Liver Function Tests: No results for input(s): AST, ALT, ALKPHOS, BILITOT, PROT, ALBUMIN in the last 168 hours. No results for input(s): LIPASE, AMYLASE in the last 168 hours. No results for input(s): AMMONIA in the last 168 hours.  CBC:  Recent Labs Lab 06/18/17 0400 06/19/17 0253 06/20/17 0445 06/21/17 0329 06/22/17 0253  WBC 8.4 8.9 10.6* 9.7 12.5*  HGB 8.7* 8.6* 8.0* 7.8* 8.1*  HCT 27.4* 28.2* 26.1* 24.4* 25.6*  MCV 92.6 93.4 93.5 91.0 89.8  PLT 174 143* 144* 131* 140*    INR:  Recent Labs Lab 06/19/17 0253 06/20/17 0547 06/21/17 0329 06/21/17 1353 06/22/17 0253  INR 1.52 2.39 3.69 2.49 2.83   Other results:    Imaging   No results found.  Medications:    Scheduled Medications: . amiodarone  200 mg Per Tube Daily  . amLODipine  10 mg Oral BID  . chlorhexidine gluconate (MEDLINE KIT)  15 mL Mouth Rinse BID  . Chlorhexidine Gluconate Cloth  6 each Topical q morning - 10a  . collagenase   Topical Daily  . doxazosin  4 mg Oral Q12H  . febuxostat  120 mg Oral Daily  . feeding supplement (NEPRO CARB STEADY)  1,000 mL Per Tube Q24H  . feeding supplement (PRO-STAT SUGAR FREE 64)  60 mL Per Tube BID  . free water  400 mL Per Tube Q4H  . furosemide  40 mg Oral Daily  . hydrALAZINE  100 mg Oral Q8H  . insulin aspart  0-15 Units Subcutaneous Q4H  . levETIRAcetam  500 mg Per Tube BID  . levothyroxine  50 mcg Per NG tube QAC breakfast  . magnesium oxide  400 mg Per Tube Daily  . mouth rinse  15 mL Mouth Rinse Q4H  . mexiletine  300 mg Per Tube Q12H  . pantoprazole sodium  40 mg Per Tube BID    . potassium chloride  20 mEq Per Tube Once  . sodium chloride flush  10-40 mL Intracatheter Q12H  . sotalol  80 mg Per Tube Daily  . spironolactone  25 mg Oral Daily  . Warfarin - Pharmacist Dosing Inpatient   Does not apply q1800    Infusions: . sodium chloride 10 mL/hr at 06/22/17 0800    PRN Medications: sodium chloride, acetaminophen (  TYLENOL) oral liquid 160 mg/5 mL, albuterol, docusate, fentaNYL (SUBLIMAZE) injection, Gerhardt's butt cream, hydrALAZINE, neomycin-bacitracin-polymyxin, ondansetron (ZOFRAN) IV, oxyCODONE, pneumococcal 23 valent vaccine, silver nitrate applicators, sodium chloride flush   Patient Profile   57 yo with CAD s/p CABG, ischemic cardiomyopathy/chronic systolic CHF, tophaceous gout, and CKD stage 3 was admitted for diuresis and consideration for LVAD placement. S/p HVAD on 6/28  Assessment/Plan:    1. Acute/chronic systolic CHF s/p HVAD: Ischemic cardiomyopathy.  [employer] Jude ICD.  Echo (6/18) with EF 15%, mildly dilated RV with moderately decreased systolic function.  s/p HVAD placement 6/28 and had to return to OR to evacuate mediastinal hematoma.  He had been weaned off pressors/milrinone, but developed ileus w/ likely aspiration event 7/4, re-intubated, developed afib/RVR requiring norepinephrine. Extubated 7/6.  VAD speed turned down to 2540 with VT. VAD parameters currently look good.  Milrinone stopped 7/17 with recurrent VT.  He was started back on norepinephrine 8/9 due to soft BP after intubation and suspected septic shock, now off norepinephrine.  MAP finally improved to goal, getting hydralazine per tube as well as amlodipine, doxazosin, and spironolactone.  Flow higher with improved BP. He has had massive 3rd spacing.  Legs now wrapped.  Today, CVP 8-9 on my measure.  - Lasix 40 mg po daily, watch creatinine closely as up a bit.  - Unna boots in place.  - Continue antihypertensive regimen.    - Off ASA with GI Bleed  - Goal INR 2-2.5, now off  bivalirudin. 2. AKI on CKD stage 3: Creatinine up a bit with IV Lasix yesterday, to po today.  Follow closely, stop Lasix if rise again in creatinine.  3. Symptomatic anemia due to acute UGI bleeding:  Received 3 units PRBCs 7/16 and 3 units PRBCs on 04/27/2017. 1 unit PRBCs 7/20. 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  Requiring 2 clips + epi + APC. 1 unit PRBCs 8/6.  He had 1 unit PRBCs 8/10 to provide volume.  1 unit PRBCs on 8/22 and again 9/1.  No overt bleeding.   - Got Feraheme 7/28.  - Continue Protonix 40 po bid. - Transfuse hgb < 8, 8.1 today.  4. Ventricular tachycardia: Status post ventricular tachycardia 2 on 7/13. Possible suction event. VAD speed turned down to 2700.  Recurrent VT 7/17. EP called to bedside. Overdrive pacing successful for about 10 minutes but VT recurred.  Milrinone turned off, remained in VT overnight but hemodynamically stable.  Speed decreased to 2600.  On 7/18, we were able to pace him out of VT.  Lidocaine added then stopped due to confusion/mental status changes.  He had VT 7/22 about 30 minutes after getting IV Lasix, had to be paced out again => may have been due to rapid fluid shift.  Pt paced out of VT 3 separate occasions 7/25. Got amio bolus x 2 and started back on IV infusion.  Transitioned to amiodarone 400 mg tid.  On 8/1 had recurrent VT and paced out again.  Recurrent VT 8/2 per EP sotalol started along with amiodarone, sotalol decreased to once daily with bradycardia. Remains quiescent.  - Continue mexiletine to 300 mg BID, sotalol 80 mg daily.  - Continue amiodarone 200 mg daily.  - Keep K > 4.0 Mg > 2.0 => give Mg today.  5. Paroxysmal Atrial fibrillation:  Developed atrial fibrillation with RVR in setting of aspiration PNA on 7/4.  In NSR currently on triple AA therapy. No change.  6.  Malnutrition:  Continue tube feeds via Cor-track. Nutrition following. No change.  7. Aspiration PNA with acute hypoxemic  respiratory failure on 7/4: He was extubated on 7/6. Tracheal aspirate with Klebsiella and citrobacter. Both sensitive to Zosyn. Completed abx 7/13. Possible aspiration 8/8. Treated with meropenem. Stopped 8/24 - Off abx. No change.  - He remains NPO, everything via Cor-track.  May end up needing PEG.  8. Acute Respiratory Failure: Intubated 7/17 in setting of complex GI intervention. Extubated 7/18. Re-intubated 8/8.  Weak respiratory muscles. He has not wanted a tracheostomy.  Extubated on 8/22.   - Remains stable off vent.  9. CAD: s/p CABG 2012. No chest pain.  Off aspirin due to GI bleeding. No change.  10. Gout: Severe tophaceous gout.  - Continue uloric. Stopped colchicine with diarrhea. Flare now resolved. Can stop prednisone.  11. Delirium: Appreciate neuro input Possibly related to lidocaine.  Lidocaine stopped. CT of head negative. EEG ok. No further workup per neuro. Neuro  reconsulted 8/7 with AMS => ?related to fever/infection.  He has been more clear on vent.  CT head 05/27/17 and 8/10 with lacunar infarcts but do not appear acute.  Seizure activity versus myoclonus => Depakote started.  Switched to [place] with ?drug rash from Depakote.   - Currently resolved. 12. UTI: Treated/resolved.   13. Unstageable Pressure Ulcer: Sacrum. Santyl daily to enzymatically debride. Reposition R to L.  - Continue with hydrotherapy. No change. Appreciate WOC care.  14. Bradycardia: With hypotension.  Currently back-up pacing set at 60 bpm.  Does not have atrial lead so pacing asynchronously. Currently pacing only rarely.    15. Hypothyroidism: May be related to amiodarone, he is on Levoxyl. No change.    16. ID: See above - Meropenem stopped 8/24 . No change.  18. Severe Deconditioning: Critical care myopathy. Continue PT. CIR MD saw, not yet appropriate. Needs to get up to chair.  Think he is going to need a different room to facilitate his PT, discussed with nurse.  19. Hypernatremia: sodium 147, slow  down trend. Continue free water boluses 400 q4 hrs. 20. Thrombocytopenia: Mild but plts dropped 50% over a few days. HIT negative.  Now off bivalirudin with stable plts. 21. Pharyngeal muscle weakness: Remains NPO with tube feeds.  Speech/swallow following.   We will not be able to get him to CIR.  Will explore LTAC.  May need to wait a few more days with more PT and reassess for CIR.  Needs different room where he can get up with PT, discussed with nurse.   I reviewed the HVAD parameters from today, and compared the results to the patient's prior recorded data.  No programming changes were made.  The HVAD is functioning within specified parameters.    Loralie Champagne, MD 06/22/2017, 8:58 AM  VAD Team --- VAD ISSUES ONLY--- Pager [phone] (7am - 7am) Advanced Heart Failure Team  Pager [phone] (M-F; 7a - 4p)  Please contact [facility] for night-coverage after hours (4p -7a ) and weekends on [email or web address]

## 2017-06-22 NOTE — Progress Notes (Signed)
Received call back from [facility] Theeslie RN, vad coordinator, she stated she spoke with Dr Shirlee LatchMclean and relayed what pt's spouse concerns were about pt being lethargic. Verlon AuLeslie stated Dr Shirlee LatchMclean said pt is tired and tends to be lethargic and that this is not a change at this time. Will continue to monitor

## 2017-06-22 NOTE — Progress Notes (Signed)
650mg  tylenol given for increased temp

## 2017-06-22 NOTE — Progress Notes (Signed)
Paged Md on call FYI potassium of 3.7 and HGB 8.1.  Will continue to monitor. Karena Addisonoro, Hosey Burmester T

## 2017-06-22 NOTE — Progress Notes (Signed)
ANTICOAGULATION CONSULT NOTE - Follow Up Consult  Pharmacy Consult for warfarin Indication: HVAD  Allergies  Allergen Reactions  . Depakote [Divalproex Sodium] Rash    Developed rash which resolved once med was removed    Patient Measurements: Height: 6' (182.9 cm) Weight: 243 lb (110.2 kg) IBW/kg (Calculated) : 77.6   Vital Signs: Temp: 98.4 F (36.9 C) (09/02 0744) Temp Source: Oral (09/02 0744) BP: 92/74 (09/02 0744) Pulse Rate: 31 (09/02 0744)  Labs:  Recent Labs  06/20/17 0445  06/20/17 1452 06/21/17 0329 06/21/17 0822 06/21/17 1353 06/22/17 0253  HGB 8.0*  --   --  7.8*  --   --  8.1*  HCT 26.1*  --   --  24.4*  --   --  25.6*  PLT 144*  --   --  131*  --   --  140*  APTT  --   < > 85* 115* 129*  --   --   LABPROT  --   < >  --  36.3*  --  26.7* 29.5*  INR  --   < >  --  3.69  --  2.49 2.83  CREATININE 1.27*  --   --  1.33*  --   --  1.51*  < > = values in this interval not displayed.  Estimated Creatinine Clearance: 71.7 mL/min (A) (by C-G formula based on SCr of 1.51 mg/dL (H)).   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   Bival turned off yesterday, INR continues to trend up this morning to 2.8. CBC stable.  HIT antibody was negative.   Goal of Therapy:  INR goal 2-2.5 (GIB this admit) Monitor platelets by anticoagulation protocol: Yes   Plan:  Continue warfarin 3mg  tonight  Sheppard CoilFrank Arnet Hofferber PharmD., BCPS Clinical Pharmacist Pager [phone] 06/22/2017 11:23 AM

## 2017-06-23 ENCOUNTER — Inpatient Hospital Stay (HOSPITAL_COMMUNITY): Payer: Medicare PPO

## 2017-06-23 DIAGNOSIS — R531 Weakness: Secondary | ICD-10-CM

## 2017-06-23 DIAGNOSIS — Z66 Do not resuscitate: Secondary | ICD-10-CM

## 2017-06-23 LAB — BASIC METABOLIC PANEL
Anion gap: 7 (ref 5–15)
BUN: 70 mg/dL — AB (ref 6–20)
CHLORIDE: 117 mmol/L — AB (ref 101–111)
CO2: 23 mmol/L (ref 22–32)
CREATININE: 1.83 mg/dL — AB (ref 0.61–1.24)
Calcium: 7.5 mg/dL — ABNORMAL LOW (ref 8.9–10.3)
GFR, EST AFRICAN AMERICAN: 47 mL/min — AB (ref >60)
GFR, EST NON AFRICAN AMERICAN: 40 mL/min — AB (ref >60)
Glucose, Bld: 127 mg/dL — ABNORMAL HIGH (ref 65–99)
POTASSIUM: 3.9 mmol/L (ref 3.5–5.1)
SODIUM: 147 mmol/L — AB (ref 135–145)

## 2017-06-23 LAB — URINALYSIS, ROUTINE W REFLEX MICROSCOPIC
BACTERIA UA: NONE SEEN
Bilirubin Urine: NEGATIVE
GLUCOSE, UA: NEGATIVE mg/dL
Hgb urine dipstick: NEGATIVE
KETONES UR: NEGATIVE mg/dL
NITRITE: NEGATIVE
PROTEIN: 30 mg/dL — AB
Specific Gravity, Urine: 1.014 (ref 1.005–1.030)
pH: 5 (ref 5.0–8.0)

## 2017-06-23 LAB — IRON AND TIBC: IRON: 8 ug/dL — AB (ref 45–182)

## 2017-06-23 LAB — PROTIME-INR
INR: 4.24 — AB
INR: 4.28 — AB
PROTHROMBIN TIME: 40.8 s — AB (ref 11.4–15.2)
Prothrombin Time: 40.5 seconds — ABNORMAL HIGH (ref 11.4–15.2)

## 2017-06-23 LAB — GLUCOSE, CAPILLARY
GLUCOSE-CAPILLARY: 127 mg/dL — AB (ref 65–99)
GLUCOSE-CAPILLARY: 111 mg/dL — AB (ref 65–99)
GLUCOSE-CAPILLARY: 180 mg/dL — AB (ref 65–99)
Glucose-Capillary: 134 mg/dL — ABNORMAL HIGH (ref 65–99)
Glucose-Capillary: 154 mg/dL — ABNORMAL HIGH (ref 65–99)
Glucose-Capillary: 180 mg/dL — ABNORMAL HIGH (ref 65–99)

## 2017-06-23 LAB — CBC
HCT: 25.2 % — ABNORMAL LOW (ref 39.0–52.0)
Hemoglobin: 8 g/dL — ABNORMAL LOW (ref 13.0–17.0)
MCH: 28.8 pg (ref 26.0–34.0)
MCHC: 31.7 g/dL (ref 30.0–36.0)
MCV: 90.6 fL (ref 78.0–100.0)
PLATELETS: 137 10*3/uL — AB (ref 150–400)
RBC: 2.78 MIL/uL — AB (ref 4.22–5.81)
RDW: 20.6 % — AB (ref 11.5–15.5)
WBC: 15.2 10*3/uL — AB (ref 4.0–10.5)

## 2017-06-23 LAB — PROCALCITONIN: PROCALCITONIN: 2.66 ng/mL

## 2017-06-23 LAB — RETICULOCYTES
RBC.: 2.78 MIL/uL — ABNORMAL LOW (ref 4.22–5.81)
Retic Count, Absolute: 27.8 10*3/uL (ref 19.0–186.0)
Retic Ct Pct: 1 % (ref 0.4–3.1)

## 2017-06-23 LAB — FERRITIN: FERRITIN: 1706 ng/mL — AB (ref 24–336)

## 2017-06-23 LAB — LACTATE DEHYDROGENASE: LDH: 179 U/L (ref 98–192)

## 2017-06-23 LAB — VITAMIN B12: VITAMIN B 12: 336 pg/mL (ref 180–914)

## 2017-06-23 LAB — FOLATE: FOLATE: 16.2 ng/mL (ref >5.9)

## 2017-06-23 MED ORDER — VANCOMYCIN HCL IN DEXTROSE 750-5 MG/150ML-% IV SOLN
750.0000 mg | Freq: Two times a day (BID) | INTRAVENOUS | Status: DC
  Administered 2017-06-23 – 2017-06-24 (×3): 750 mg via INTRAVENOUS
  Filled 2017-06-23 (×4): qty 150

## 2017-06-23 MED ORDER — PIPERACILLIN-TAZOBACTAM 3.375 G IVPB
3.3750 g | Freq: Three times a day (TID) | INTRAVENOUS | Status: DC
  Administered 2017-06-23 – 2017-06-25 (×5): 3.375 g via INTRAVENOUS
  Filled 2017-06-23 (×10): qty 50

## 2017-06-23 MED ORDER — SODIUM CHLORIDE 0.9 % IV BOLUS (SEPSIS)
250.0000 mL | Freq: Once | INTRAVENOUS | Status: AC
  Administered 2017-06-23: 250 mL via INTRAVENOUS

## 2017-06-23 MED ORDER — VANCOMYCIN HCL 10 G IV SOLR
2500.0000 mg | Freq: Once | INTRAVENOUS | Status: AC
  Administered 2017-06-23: 2500 mg via INTRAVENOUS
  Filled 2017-06-23 (×2): qty 2500

## 2017-06-23 NOTE — Progress Notes (Signed)
Patient ID: James [name], male   DOB: [birth year], 56 y.o.   MRN: [id number]  This NP visited patient at the bedside as a follow up for palliative needs and emotional support.  Patient's wife, son and mother were present at bedside.  Patient appears weak voice is almost in audible.  I shared my worry that the patient appears to be declining,  with increased WBCs, increased respirations and difficulty clearing secretions.  Discussed with wife the "what if" of continued decline.  She clearly verbalizes that reintubation is not desired, and if from a pulmonary standpoint the patient is suffering comfort is the priority.  I discussed the utilization of opioids to enhance comfort in an end-of-life situation.  Discussed with patient the importance of continued conversation with family and their  medical providers regarding overall plan of care and treatment options,  ensuring decisions are within the context of the patients values and GOCs.  Questions and concerns addressed    palliative medicine team will continue to support holistically.  Discussed with bedside nurse  Total time spent on the unit was 30 minutes. Time in 1530 time out 1600  Greater than 50% of the time was spent in counseling and coordination of care  Lorinda CreedMary Jannet Calip NP  Palliative Medicine Team Team Phone # [phone] Pager [phone]

## 2017-06-23 NOTE — Progress Notes (Signed)
Patient ID: ADE STMARIE, male   DOB: Dec 07, 1960, 56 y.o.   MRN: [id number]  HVAD Rounding Note  Subjective:    He has been more lethargic over the past 24-48 hrs with difficulty coughing up secretions and requiring NT suction. Febrile to 101.9 this am. WBC up a little to 15.2. CXR shows poor inspiration with bibasilar atelectasis.   LVAD INTERROGATION:  HVAD:  Flow 4.8 liters/min, speed 2450, power 3.7  Objective:    Vital Signs:   Temp:  [98.7 F (37.1 C)-101.9 F (38.8 C)] 101.9 F (38.8 C) (09/03 1142) Pulse Rate:  [68-78] 68 (09/03 0740) Resp:  [19-33] 32 (09/03 0740) BP: (81-93)/(51-74) 93/71 (09/03 0740) SpO2:  [96 %-100 %] 98 % (09/03 0740) Weight:  [113.9 kg (251 lb)] 113.9 kg (251 lb) (09/03 0352) Last BM Date: 06/22/17 Mean arterial Pressure 70-80's  Intake/Output:   Intake/Output Summary (Last 24 hours) at 06/23/17 1233 Last data filed at 06/23/17 0915  Gross per 24 hour  Intake             1860 ml  Output              450 ml  Net             1410 ml     Physical Exam: General:  Chronically ill appearing. HEENT: Feeding tube in place Cor: distant heart sounds with LVAD hum present. Lungs: bilateral rhonchi Abdomen: soft, nontender, nondistended. Good bowel sounds. Extremities: Thigh edema, low legs with Unna boots. Neuro: lethargic, opens eyes and follows commands weakly.  Telemetry: [id number]'s  Labs: Basic Metabolic Panel:  Recent Labs Lab 06/18/17 0400 06/19/17 0253 06/20/17 0445 06/21/17 0329 06/22/17 0253 06/23/17 0348  NA 153* 153* 153* 150* 147* 147*  K 3.2* 3.2* 3.4* 3.5 3.7 3.9  CL 122* 124* 123* 122* 118* 117*  CO2 _0 GLUCOSE 163* 139* 169* 136* 165* 127*  BUN 48* 51* 50* 55* 64* 70*  CREATININE 1.17 1.22 1.27* 1.33* 1.51* 1.83*  CALCIUM 8.0* 8.1* 8.0* 7.8* 7.7* 7.5*  MG 1.6*  --   --   --  1.8  --     Liver Function Tests: No results for input(s): AST, ALT, ALKPHOS, BILITOT, PROT, ALBUMIN in the last 168  hours. No results for input(s): LIPASE, AMYLASE in the last 168 hours. No results for input(s): AMMONIA in the last 168 hours.  CBC:  Recent Labs Lab 06/19/17 0253 06/20/17 0445 06/21/17 0329 06/22/17 0253 06/23/17 0348  WBC 8.9 10.6* 9.7 12.5* 15.2*  HGB 8.6* 8.0* 7.8* 8.1* 8.0*  HCT 28.2* 26.1* 24.4* 25.6* 25.2*  MCV 93.4 93.5 91.0 89.8 90.6  PLT 143* 144* 131* 140* 137*    INR:  Recent Labs Lab 06/21/17 0329 06/21/17 1353 06/22/17 0253 06/23/17 0348 06/23/17 0513  INR 3.69 2.49 2.83 4.24* 4.28*    Other results:  EKG:   Imaging: Dg Chest Port 1 View  Result Date: 06/23/2017 CLINICAL DATA:  Indwelling left ventricular assist device. Follow-up basilar atelectasis. EXAM: PORTABLE CHEST 1 VIEW COMPARISON:  06/22/2017, 06/12/2017 and earlier. FINDINGS: LV assist device unchanged. Left subclavian single lead transvenous pacemaker unchanged and appears intact. Feeding tube courses below the diaphragm into the stomach though its tip is not included on the image. Right arm PICC tip projects at or near the cavoatrial junction, unchanged. Markedly suboptimal inspiration with bibasilar atelectasis, unchanged. Lungs otherwise clear. Mild pulmonary venous hypertension without overt edema. Cardiac silhouette markedly enlarged, unchanged.  IMPRESSION: 1.  Support apparatus satisfactory. 2. Markedly suboptimal inspiration which accounts for bibasilar atelectasis, stable. No acute cardiopulmonary disease otherwise. Electronically Signed   By: Evangeline Dakin M.D.   On: 06/23/2017 09:20   Dg Chest Port 1 View  Result Date: 06/22/2017 CLINICAL DATA:  Followup left ventricular assist device. EXAM: PORTABLE CHEST 1 VIEW COMPARISON:  06/12/2017. FINDINGS: Poor inspiration. Stable enlarged cardiac silhouette, post CABG changes and left ventricular assist device. Stable left subclavian AICD lead. Feeding tube extending into the distal stomach. Right PICC tip in the right atrium. Resolved left  lower lobe airspace opacity. Minimal right basilar atelectasis. Lower thoracic spine degenerative changes. Bilateral shoulder degenerative changes, right shoulder fixation anchor and superior migration of both humeral heads with bony remodeling. IMPRESSION: 1. Stable left ventricular assist device. 2. Poor inspiration with minimal right basilar atelectasis. 3. Right PICC tip in the right atrium. This could be retracted 4 cm to place it at the superior cavoatrial junction. 4. Bilateral shoulder degenerative changes and evidence of large, chronic bilateral rotator cuff tears. Electronically Signed   By: Claudie Revering M.D.   On: 06/22/2017 13:35      Medications:     Scheduled Medications: . amiodarone  200 mg Per Tube Daily  . amLODipine  10 mg Oral BID  . chlorhexidine gluconate (MEDLINE KIT)  15 mL Mouth Rinse BID  . Chlorhexidine Gluconate Cloth  6 each Topical q morning - 10a  . collagenase   Topical Daily  . doxazosin  4 mg Oral Q12H  . febuxostat  120 mg Oral Daily  . feeding supplement (NEPRO CARB STEADY)  1,000 mL Per Tube Q24H  . feeding supplement (PRO-STAT SUGAR FREE 64)  60 mL Per Tube BID  . free water  400 mL Per Tube Q4H  . hydrALAZINE  100 mg Oral Q8H  . insulin aspart  0-15 Units Subcutaneous Q4H  . levETIRAcetam  500 mg Per Tube BID  . levothyroxine  50 mcg Per NG tube QAC breakfast  . magnesium oxide  400 mg Per Tube Daily  . mouth rinse  15 mL Mouth Rinse Q4H  . mexiletine  300 mg Per Tube Q12H  . pantoprazole sodium  40 mg Per Tube BID  . sodium chloride flush  10-40 mL Intracatheter Q12H  . sotalol  80 mg Per Tube Daily  . spironolactone  25 mg Oral Daily  . Warfarin - Pharmacist Dosing Inpatient   Does not apply q1800     Infusions: . sodium chloride 10 mL (06/22/17 1956)  . piperacillin-tazobactam (ZOSYN)  IV    . vancomycin    . vancomycin       PRN Medications:  sodium chloride, acetaminophen (TYLENOL) oral liquid 160 mg/5 mL, albuterol, docusate,  fentaNYL (SUBLIMAZE) injection, Gerhardt's butt cream, hydrALAZINE, neomycin-bacitracin-polymyxin, ondansetron (ZOFRAN) IV, oxyCODONE, pneumococcal 23 valent vaccine, silver nitrate applicators, sodium chloride flush  1. POD 67s/p HVAD for ischemic cardiomyopathy with acute on chronic systolic heart failure, EF 15% with moderate RV dysfunction. Returned to OR for bleeding from raw mediastinal tissues. His VAD parameters have been stable. MAP is good.  2. Acute mental status changes of unclear etiology. This could have beenrelated to infection, medications, hypothyroidism, possibly stroke not seenon CT. CT head from 8/7 showed lacunar infarcts that did not appear acute. Seen by neurology. EEG didnot show seizure activity. Treated infection and mental status improved. Lethargy now likely due to generalized weakness and is worse the past two days likely due to infection  with new fever.   3. Acute respiratory failure: reintubated for airway protection. He has done fairly well but has had trouble coughing up his secretions for the past day or so. Will NT suction as needed. Concerned that he has developed pneumonia.  4. Acute on chronic kidney failure. BUN and creat up more today and lasix stopped. May be due to sepsis which happened the last time he got septic.  5. GI bleeding and anemia: does not appear to be bleeding at this time.   6. VT: None since Sotalol started in addition to mexiletine and amiodarone.   7. Fever and Leukocytosis. Recultured today and starting on Vanc and Zosyn. Most likely sources are pneumonia or sacral wound. He has worsened since meropenem stopped.  8. Sacral pressure sore: Continue present treatment with hydrotherapy.  9. Nutrition: tolerating tube feeds at goal.   10. Hypernatremia:receiving free water boluses.  11. Anticoagulation: INR jumped up today. Holding coumadin.  He is fairly tenuous from a pulmonary standpoint and can't control his  secretions. He and wife do not want reintubation. She is at bedside and I discussed current status and plans with her.   I reviewed the LVAD parameters from today, and compared the results to the patient's prior recorded data. No programming changes were made. The LVAD is functioning within specified parameters. LVAD interrogation was negative for any significant power changes, alarms or PI events/speed drops. LVAD equipment check completed andis in good working order. Back-up equipment present.    Length of Stay: [place] 06/23/2017, 12:33 PM

## 2017-06-23 NOTE — Progress Notes (Signed)
PT Cancellation Note  Patient Details Name: James [name] MRN: [id number] DOB: Feb 23, 1961   Cancelled Treatment:    Reason Eval/Treat Not Completed: Medical issues which prohibited therapy; spoke with RN/OT who report pt not able to participate today.  Will attempt again another day.   [name] 06/23/2017, 4:57 PM  [name], PT [phone] 06/23/2017

## 2017-06-23 NOTE — Progress Notes (Signed)
Md paged PT/INR 40.8/4.28 Critical Value per lab.  RN redrew lab and resulted.  Will continue to monitor. Karena Addisonoro, Jett Kulzer T

## 2017-06-23 NOTE — Progress Notes (Signed)
Paged Md about pt's PT/NR 40.5/4.24.  Will redraw and verify value.  Karena Addisonoro, Quaid Yeakle T

## 2017-06-23 NOTE — Progress Notes (Addendum)
Patient ID: James [name], male   DOB: 13-Apr-1961, 56 y.o.   MRN: [id number]   Advanced Heart Failure VAD Team Note  Subjective:    Events: --HVAD placed 6/28.  Returned to the OR that evening with high chest tube output, evacuation of mediastinal hematoma.  --Extubated 6/29. Milrinone stopped on 7/4. --7/4, patient developed ileus with respiratory compromise. NGT placed with 3 L suctioned out.  CXR with suspicion for aspiration PNA.  Patient had to be intubated.  He went into atrial fibrillation with RVR.  He became hypotensive and was started on norepinephrine and phenylephrine.  Amiodarone gtt begun.    --Extubated again on 7/6.  -- 7/13 Had 2 sustained episodes of VT. Had to be cardioverted 1. Second episode broke with overdrive pacing with Dr. Caryl Comes. VAD speed turned down to 2700.  --Melena due to acute GI bleed --> 2 units PRBCs 7/14, 2 units 7/15. 3 U PRBCs 7/16,  04/24/2017 3 UPRBCs.  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  He had epinephrine, APC, and 2 clips. Intubated prior to procedure and placed on norepinephrine. Speed dropped to 2660.  --7/18 Back in VT with overdrive pacing. Lidocaine was started in addition to amiodarone. Extubated. --7/19 lidocaine stopped with confusion. Neuro consulted.  EEG normal.  CT of head no acute findings. 7/20 confusion had resolved.  --1 unit PRBCs 7/20.  --7/22, developed sustained VT with rate around 130 shortly after getting IV Lasix.  We were able to pace him out and back to NSR.  He was put back on amiodarone gtt.  --More VT on 7/25, paced out.  Back on IV amiodarone gtt.  NSR.  -- 7/31 Ramp ECHO decrease speed 2600 rpm.  -- 8/1 VT- paced out -- 8/2 VT - started on sotalol -- 8/6 Hgb 7.5, 1 unit PRBCs -- 8/7 Hgb 9.5, confused.  Head CT with right superior cerebellar artery lacunar infarcts, not acute. -- 8/8 Possible aspiration. Reintubated. -- 8/10 possible seizure => Depakote.  CT head no  change.  1 unit PRBCs.  -- Multiple suction alarms on 8/17 night which persisted so suction alarm deactivated.  -- Extubated 8/23 -- 9/1 got 1 unit PRBCs  MAP 70s-80s.  Remains NPO due to pharyngeal muscle weakness.  CVP 8-9 this morning still.  More lethargic but responds to commands.  Suctioning thick secretions.  Febrile to 101 with WBCs up to 15.2 today.    HIT negative, plts stabilized.  INR high now, holding warfarin.   Getting hydrotherapy.   Sodium 155 => 153 => 153 => 153 => 150 =>147 => 147  HVAD INTERROGATION:  HVAD:  Flow 4.8 liters/min, speed 2450,  power 3.7 W,  Peak 7 Trough 3. Suction alarms off. Lavare on.   Objective:    Vital Signs:   Temp:  [98.7 F (37.1 C)-101.6 F (38.7 C)] 101 F (38.3 C) (09/03 0740) Pulse Rate:  [32-78] 68 (09/03 0740) Resp:  [19-33] 32 (09/03 0740) BP: (81-93)/(51-74) 93/71 (09/03 0740) SpO2:  [96 %-100 %] 98 % (09/03 0740) Weight:  [251 lb (113.9 kg)] 251 lb (113.9 kg) (09/03 0352) Last BM Date: 06/22/17 Mean arterial Pressure 70s-80s Intake/Output:   Intake/Output Summary (Last 24 hours) at 06/23/17 0855 Last data filed at 06/23/17 0600  Gross per 24 hour  Intake             2250 ml  Output  450 ml  Net             1800 ml     Physical Exam    Physical Exam: CVP 9 GENERAL: Fatigued and chronically ill appearing. NAD.  HEENT: Normal. NECK: Supple, JVP 8-9cm. Carotids OK.  CARDIAC: Mechanical heart sounds with LVAD hum present.  LUNGS: CTAB, normal effort.  ABDOMEN: NT, ND, no HSM. No bruits or masses. +BS LVAD exit site: Dressing dry and intact. No erythema or drainage. Stabilization device present and accurately applied. Driveline dressing changed daily per sterile technique. EXTREMITIES: Warm and dry. No cyanosis or clubbing. Diffuse gouty abnormalities. LEs wrapped. 1+ edema into thighs.  NEUROLOGIC: More lethargic but follows commands  Telemetry   Personally reviewed, NSR 70s   Labs    Basic Metabolic Panel:  Recent Labs Lab 06/18/17 0400 06/19/17 0253 06/20/17 0445 06/21/17 0329 06/22/17 0253 06/23/17 0348  NA 153* 153* 153* 150* 147* 147*  K 3.2* 3.2* 3.4* 3.5 3.7 3.9  CL 122* 124* 123* 122* 118* 117*  CO2 _0 GLUCOSE 163* 139* 169* 136* 165* 127*  BUN 48* 51* 50* 55* 64* 70*  CREATININE 1.17 1.22 1.27* 1.33* 1.51* 1.83*  CALCIUM 8.0* 8.1* 8.0* 7.8* 7.7* 7.5*  MG 1.6*  --   --   --  1.8  --     Liver Function Tests: No results for input(s): AST, ALT, ALKPHOS, BILITOT, PROT, ALBUMIN in the last 168 hours. No results for input(s): LIPASE, AMYLASE in the last 168 hours. No results for input(s): AMMONIA in the last 168 hours.  CBC:  Recent Labs Lab 06/19/17 0253 06/20/17 0445 06/21/17 0329 06/22/17 0253 06/23/17 0348  WBC 8.9 10.6* 9.7 12.5* 15.2*  HGB 8.6* 8.0* 7.8* 8.1* 8.0*  HCT 28.2* 26.1* 24.4* 25.6* 25.2*  MCV 93.4 93.5 91.0 89.8 90.6  PLT 143* 144* 131* 140* 137*    INR:  Recent Labs Lab 06/21/17 0329 06/21/17 1353 06/22/17 0253 06/23/17 0348 06/23/17 0513  INR 3.69 2.49 2.83 4.24* 4.28*   Other results:    Imaging   Dg Chest Port 1 View  Result Date: 06/22/2017 CLINICAL DATA:  Followup left ventricular assist device. EXAM: PORTABLE CHEST 1 VIEW COMPARISON:  06/12/2017. FINDINGS: Poor inspiration. Stable enlarged cardiac silhouette, post CABG changes and left ventricular assist device. Stable left subclavian AICD lead. Feeding tube extending into the distal stomach. Right PICC tip in the right atrium. Resolved left lower lobe airspace opacity. Minimal right basilar atelectasis. Lower thoracic spine degenerative changes. Bilateral shoulder degenerative changes, right shoulder fixation anchor and superior migration of both humeral heads with bony remodeling. IMPRESSION: 1. Stable left ventricular assist device. 2. Poor inspiration with minimal right basilar atelectasis. 3. Right PICC tip in the right atrium. This  could be retracted 4 cm to place it at the superior cavoatrial junction. 4. Bilateral shoulder degenerative changes and evidence of large, chronic bilateral rotator cuff tears. Electronically Signed   By: Claudie Revering M.D.   On: 06/22/2017 13:35    Medications:    Scheduled Medications: . amiodarone  200 mg Per Tube Daily  . amLODipine  10 mg Oral BID  . chlorhexidine gluconate (MEDLINE KIT)  15 mL Mouth Rinse BID  . Chlorhexidine Gluconate Cloth  6 each Topical q morning - 10a  . collagenase   Topical Daily  . doxazosin  4 mg Oral Q12H  . febuxostat  120 mg Oral Daily  . feeding supplement (NEPRO CARB STEADY)  1,000 mL Per Tube Q24H  . feeding supplement (PRO-STAT SUGAR FREE 64)  60 mL Per Tube BID  . free water  400 mL Per Tube Q4H  . hydrALAZINE  100 mg Oral Q8H  . insulin aspart  0-15 Units Subcutaneous Q4H  . levETIRAcetam  500 mg Per Tube BID  . levothyroxine  50 mcg Per NG tube QAC breakfast  . magnesium oxide  400 mg Per Tube Daily  . mouth rinse  15 mL Mouth Rinse Q4H  . mexiletine  300 mg Per Tube Q12H  . pantoprazole sodium  40 mg Per Tube BID  . sodium chloride flush  10-40 mL Intracatheter Q12H  . sotalol  80 mg Per Tube Daily  . spironolactone  25 mg Oral Daily  . Warfarin - Pharmacist Dosing Inpatient   Does not apply q1800    Infusions: . sodium chloride 10 mL (06/22/17 1956)  . sodium chloride      PRN Medications: sodium chloride, acetaminophen (TYLENOL) oral liquid 160 mg/5 mL, albuterol, docusate, fentaNYL (SUBLIMAZE) injection, Gerhardt's butt cream, hydrALAZINE, neomycin-bacitracin-polymyxin, ondansetron (ZOFRAN) IV, oxyCODONE, pneumococcal 23 valent vaccine, silver nitrate applicators, sodium chloride flush   Patient Profile   56 yo with CAD s/p CABG, ischemic cardiomyopathy/chronic systolic CHF, tophaceous gout, and CKD stage 3 was admitted for diuresis and consideration for LVAD placement. S/p HVAD on 6/28  Assessment/Plan:    1. Acute/chronic  systolic CHF s/p HVAD: Ischemic cardiomyopathy.  [employer] ICD.  Echo (6/18) with EF 15%, mildly dilated RV with moderately decreased systolic function.  s/p HVAD placement 6/28 and had to return to OR to evacuate mediastinal hematoma.  He had been weaned off pressors/milrinone, but developed ileus w/ likely aspiration event 7/4, re-intubated, developed afib/RVR requiring norepinephrine. Extubated 7/6.  VAD speed turned down to 2540 with VT. VAD parameters currently look good.  Milrinone stopped 7/17 with recurrent VT.  He was started back on norepinephrine 8/9 due to soft BP after intubation and suspected septic shock, now off norepinephrine.  MAP finally improved to goal, getting hydralazine per tube as well as amlodipine, doxazosin, and spironolactone.  Flow higher with improved BP. He has had massive 3rd spacing.  Legs now wrapped.  Today, CVP 8-9 again. Now with rise in BUN/creatinine and fever, concerned for developing sepsis.  - Stop Lasix with rise in BUN/creatinine => got am dose today so will give 250 cc NS bolus.   - Unna boots in place.  - Continue antihypertensive regimen for now, will need to hold meds if MAP trends down.    - Off ASA with GI Bleed  - Goal INR 2-2.5, warfarin on hold with high INR.  2. AKI on CKD stage 3: BUN/creatinine up, concern for developing sepsis.  Stop Lasix (got dose today), will give NS bolus x 1.  3. Symptomatic anemia due to acute UGI bleeding:  Received 3 units PRBCs 7/16 and 3 units PRBCs on 05/01/2017. 1 unit PRBCs 7/20. 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  Requiring 2 clips + epi + APC. 1 unit PRBCs 8/6.  He had 1 unit PRBCs 8/10 to provide volume.  1 unit PRBCs on 8/22 and again 9/1.  No overt bleeding.   - Got Feraheme 7/28.  - Continue Protonix 40 po bid. - Transfuse hgb < 8, 8 today.  4. Ventricular tachycardia: Status post ventricular tachycardia 2 on 7/13. Possible suction event. VAD speed turned down to 2700.   Recurrent VT 7/17.  EP called to bedside. Overdrive pacing successful for about 10 minutes but VT recurred.  Milrinone turned off, remained in VT overnight but hemodynamically stable.  Speed decreased to 2600.  On 7/18, we were able to pace him out of VT.  Lidocaine added then stopped due to confusion/mental status changes.  He had VT 7/22 about 30 minutes after getting IV Lasix, had to be paced out again => may have been due to rapid fluid shift.  Pt paced out of VT 3 separate occasions 7/25. Got amio bolus x 2 and started back on IV infusion.  Transitioned to amiodarone 400 mg tid.  On 8/1 had recurrent VT and paced out again.  Recurrent VT 8/2 per EP sotalol started along with amiodarone, sotalol decreased to once daily with bradycardia. Remains quiescent.  - Continue mexiletine to 300 mg BID, sotalol 80 mg daily.  - Continue amiodarone 200 mg daily.  - Keep K > 4.0 Mg > 2.0  5. Paroxysmal Atrial fibrillation:  Developed atrial fibrillation with RVR in setting of aspiration PNA on 7/4.  In NSR currently on triple AA therapy. No change.  6. Malnutrition:  Continue tube feeds via Cor-track. Nutrition following. No change.  7. Aspiration PNA with acute hypoxemic respiratory failure on 7/4: He was extubated on 7/6. Tracheal aspirate with Klebsiella and citrobacter. Both sensitive to Zosyn. Completed abx 7/13. Possible aspiration 8/8. Treated with meropenem. Stopped 8/24 - He remains NPO, everything via Cor-track.  May end up needing PEG.  8. Acute Respiratory Failure: Intubated 7/17 in setting of complex GI intervention. Extubated 7/18. Re-intubated 8/8.  Weak respiratory muscles. He has not wanted a tracheostomy.  Extubated on 8/22.   - Increased secretions today and requiring suctioning.  - DNI, would not re-intubate.  9. CAD: s/p CABG 2012. No chest pain.  Off aspirin due to GI bleeding. No change.  10. Gout: Severe tophaceous gout.  - Continue uloric. Stopped colchicine with diarrhea. Flare now  resolved. Can stop prednisone.  11. Delirium: Appreciate neuro input Possibly related to lidocaine.  Lidocaine stopped. CT of head negative. EEG ok. No further workup per neuro. Neuro  reconsulted 8/7 with AMS => ?related to fever/infection.  He has been more clear on vent.  CT head 05/27/17 and 8/10 with lacunar infarcts but do not appear acute.  Seizure activity versus myoclonus => Depakote started.  Switched to [place] with ?drug rash from Depakote.  He now appears to have redeveloped lethargy delirium, concern for recurrent infection.  12. UTI: Treated/resolved.   13. Unstageable Pressure Ulcer: Sacrum. Santyl daily to enzymatically debride. Reposition R to L.  - Continue with hydrotherapy. No change. Appreciate WOC care.  14. Bradycardia: With hypotension.  Currently back-up pacing set at 60 bpm.  Does not have atrial lead so pacing asynchronously. Currently pacing only rarely.    15. Hypothyroidism: May be related to amiodarone, he is on Levoxyl. No change.    16. ID: Course of meropenem was finished 8/24.  Now with fever again this morning to 101 and increasing lethargy and secretions.  Lung source versus wound source.   - Continue suctioning frequently, he is DNI at this point.  - Send blood and urine cultures.  - CXR - Start vancomycin/Zosyn empirically after cultures drawn.  - Send PCT.  18. Severe Deconditioning: Critical care myopathy. Continue PT. CIR MD saw, not yet appropriate. PT on hold again with fever and lethargy.  19. Hypernatremia: sodium 147, slow down trend. Continue free water boluses 400 q4  hrs. 20. Thrombocytopenia: Mild but plts dropped 50% over a few days. HIT negative.  Now off bivalirudin with stable plts. 21. Pharyngeal muscle weakness: Remains NPO with tube feeds.  Speech/swallow following.   Patient worsening today, increased secretions. Concern for infection developing.  Empiric abx.  Would not intubate again. Will discuss with wife.   I reviewed the HVAD  parameters from today, and compared the results to the patient's prior recorded data.  No programming changes were made.  The HVAD is functioning within specified parameters.    CRITICAL CARE Performed by: Loralie Champagne  Total critical care time: 35 minutes  Critical care time was exclusive of separately billable procedures and treating other patients.  Critical care was necessary to treat or prevent imminent or life-threatening deterioration.  Critical care was time spent personally by me on the following activities: development of treatment plan with patient and/or surrogate as well as nursing, discussions with consultants, evaluation of patient's response to treatment, examination of patient, obtaining history from patient or surrogate, ordering and performing treatments and interventions, ordering and review of laboratory studies, ordering and review of radiographic studies, pulse oximetry and re-evaluation of patient's condition.  Loralie Champagne, MD 06/23/2017, 8:55 AM  VAD Team --- VAD ISSUES ONLY--- Pager [phone] (7am - 7am) Advanced Heart Failure Team  Pager [phone] (M-F; 7a - 4p)  Please contact [facility] for night-coverage after hours (4p -7a ) and weekends on [email or web address]

## 2017-06-23 NOTE — Progress Notes (Signed)
Patient's wife changed his driveline dressing today. 06/23/2017

## 2017-06-23 NOTE — Progress Notes (Addendum)
ANTICOAGULATION CONSULT NOTE - Follow Up Consult  Pharmacy Consult for warfarin Indication: HVAD  Allergies  Allergen Reactions  . Depakote [Divalproex Sodium] Rash    Developed rash which resolved once med was removed    Patient Measurements: Height: 6' (182.9 cm) Weight: 251 lb (113.9 kg) IBW/kg (Calculated) : 77.6   Vital Signs: Temp: 101 F (38.3 C) (09/03 0740) Temp Source: Oral (09/03 0740) BP: 93/71 (09/03 0740) Pulse Rate: 68 (09/03 0740)  Labs:  Recent Labs  06/20/17 1452  06/21/17 0329 06/21/17 [id number]  06/22/17 0253 06/23/17 0348 06/23/17 0513  HGB  --   < > 7.8*  --   --  8.1* 8.0*  --   HCT  --   --  24.4*  --   --  25.6* 25.2*  --   PLT  --   --  131*  --   --  140* 137*  --   APTT 85*  --  115* 129*  --   --   --   --   LABPROT  --   --  36.3*  --   < > 29.5* 40.5* 40.8*  INR  --   --  3.69  --   < > 2.83 4.24* 4.28*  CREATININE  --   --  1.33*  --   --  1.51* 1.83*  --   < > = values in this interval not displayed.  Estimated Creatinine Clearance: 60.1 mL/min (A) (by C-G formula based on SCr of 1.83 mg/dL (H)).  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   Bival turned off 9/1, INR continues to trend up this morning to 4.2 (verified twice). CBC/LDH stable.  HIT antibody was negative.   Infectious Disease: patient febrile overnight, wbc elevated at 15, increased lethargy over weekend. New orders this am to start broad antibiotics. CXR does not indicate pneumonia, he does have a large non-healing sacral wound as possible source, as well as prolonged admission.  Goal of Therapy:  INR goal 2-2.5 (GIB this admit) Monitor platelets by anticoagulation protocol: Yes   Plan:  Hold warfarin for now Vancomycin 2.5g now then 750mg  IV q12 - history of accumulating so will check trough early Zosyn 3.375g IV q8 hours  Sheppard CoilFrank Kerney Hopfensperger PharmD., BCPS Clinical Pharmacist Pager [phone] 06/23/2017 8:38  AM

## 2017-06-23 NOTE — Progress Notes (Addendum)
Physical Therapy Wound Treatment Patient Details  Name: DARIUSZ BRASE MRN: [id number] Date of Birth: 16-Jan-1961  Today's Date: 06/23/2017 Time: 1100-1133 Time Calculation (min): 33 min  Subjective  Subjective: Pt nonverbal throughout session. Moaned in pain with rolling/positioning. Patient and Family Stated Goals: healed up so I can do well with therapy  Pain Score:  Discussed with RN about possibly medicating after treatment for pain.   Wound Assessment  Pressure Injury 05/14/17 Unstageable - Full thickness tissue loss in which the base of the ulcer is covered by slough (yellow, tan, gray, green or brown) and/or eschar (tan, brown or black) in the wound bed. WOC updated, wound evolution to unstageable  (Active)  Dressing Type Moist to dry 06/23/2017 11:43 AM  Dressing Changed 06/23/2017 11:43 AM  Dressing Change Frequency Daily 06/23/2017 11:43 AM  State of Healing Eschar 06/23/2017 11:43 AM  Site / Wound Assessment Red;Yellow 06/23/2017 11:43 AM  % Wound base Red or Granulating 30% 06/23/2017 11:43 AM  % Wound base Yellow/Fibrinous Exudate 70% 06/23/2017 11:43 AM  % Wound base Black/Eschar 0% 06/23/2017 11:43 AM  % Wound base Other/Granulation Tissue (Comment) 0% 06/23/2017 11:43 AM  Peri-wound Assessment Intact 06/23/2017 11:43 AM  Wound Length (cm) 6.5 cm 06/20/2017  3:03 PM  Wound Width (cm) 8 cm 06/20/2017  3:03 PM  Wound Depth (cm) 3.6 cm 06/20/2017  3:03 PM  Tunneling (cm) 0 06/16/2017  8:00 PM  Undermining (cm) 5.0 cm 4:00-6:00, 2.6 cm at 12:00. 2.0 cm 10:00-12:00 06/20/2017  3:03 PM  Margins Unattached edges (unapproximated) 06/23/2017 11:43 AM  Drainage Amount Moderate 06/23/2017 11:43 AM  Drainage Description Serosanguineous;No odor 06/23/2017 11:43 AM  Treatment Hydrotherapy (Pulse lavage);Packing (Saline gauze) 06/23/2017 11:43 AM   Hydrotherapy Pulsed lavage therapy - wound location: sacral Pulsed Lavage with Suction (psi): 12 psi Pulsed Lavage with Suction - Normal Saline Used: 1000 mL Pulsed  Lavage Tip: Tip with splash shield Selective Debridement Selective Debridement - Tissue Removed: Deferred this session 2 respiratory status - O2 sats varying between 86% and 95% with RR elevated to 40 breaths/min while on side.    Wound Assessment and Plan  Wound Therapy - Assess/Plan/Recommendations Wound Therapy - Clinical Statement: Wound bed continues to be covered in yellow slough/unviable tissue however overall quality of tissue appears to be improving. Pt will benefit from continued hydrotherapy for the pulsed lavage that will help cleansed wound, decrease bacterial load and otherwise prep wound for selective debridement. Wound Therapy - Functional Problem List: Relative immobility, decreased strength, compromised nutrition and skin integrity.  See PT notes Factors Delaying/Impairing Wound Healing: Incontinence;Infection - systemic/local;Immobility;Multiple medical problems Hydrotherapy Plan: Debridement;Dressing change;Patient/family education;Pulsatile lavage with suction Wound Therapy - Frequency: 6X / week Wound Therapy - Current Recommendations: PT;Case manager/social work;WOC nurse Wound Therapy - Follow Up Recommendations: Home health RN;Skilled nursing facility Wound Plan: see above  Wound Therapy Goals- Improve the function of patient's integumentary system by progressing the wound(s) through the phases of wound healing (inflammation - proliferation - remodeling) by: Decrease Necrotic Tissue to: 25 % Decrease Necrotic Tissue - Progress: Progressing toward goal Increase Granulation Tissue to: 75% including any viable tissues Increase Granulation Tissue - Progress: Progressing toward goal Improve Drainage Characteristics: Min;Serous Improve Drainage Characteristics - Progress: Progressing toward goal Goals/treatment plan/discharge plan were made with and agreed upon by patient/family: Yes Time For Goal Achievement: 7 days Wound Therapy - Potential for Goals: Good  Goals will  be updated until maximal potential achieved or discharge criteria met.  Discharge criteria: when  goals achieved, discharge from hospital, MD decision/surgical intervention, no progress towards goals, refusal/missing three consecutive treatments without notification or medical reason.  GP     Thelma Comp 06/23/2017, 11:53 AM   Rolinda Roan, PT, DPT Acute Rehabilitation Services Pager: [phone]

## 2017-06-23 NOTE — Progress Notes (Signed)
OT Cancellation Note  Patient Details Name: James FabianHarry D Waldrop MRN: [id number] DOB: 1961-08-19   Cancelled Treatment:    Reason Eval/Treat Not Completed: Fatigue/lethargy limiting ability to participate.  Attempted x 2.  Pt too fatigued to participate.  Will continue attempts as appropriate.  Ladonna Vanorder [place], OTR/L [phone]   [name], Yusuke Beza M 06/23/2017, 6:12 PM

## 2017-06-23 NOTE — Progress Notes (Signed)
Wound on leg unable to assess due to having unna boot in place. Landry DykeEmer Shawneen Deetz RN BSN MSN. 06/23/2017 @09 .0

## 2017-06-24 DIAGNOSIS — K117 Disturbances of salivary secretion: Secondary | ICD-10-CM

## 2017-06-24 DIAGNOSIS — J9621 Acute and chronic respiratory failure with hypoxia: Secondary | ICD-10-CM

## 2017-06-24 DIAGNOSIS — R52 Pain, unspecified: Secondary | ICD-10-CM

## 2017-06-24 LAB — BASIC METABOLIC PANEL
ANION GAP: 5 (ref 5–15)
BUN: 74 mg/dL — ABNORMAL HIGH (ref 6–20)
CO2: 23 mmol/L (ref 22–32)
Calcium: 7.1 mg/dL — ABNORMAL LOW (ref 8.9–10.3)
Chloride: 116 mmol/L — ABNORMAL HIGH (ref 101–111)
Creatinine, Ser: 1.98 mg/dL — ABNORMAL HIGH (ref 0.61–1.24)
GFR, EST AFRICAN AMERICAN: 42 mL/min — AB (ref >60)
GFR, EST NON AFRICAN AMERICAN: 37 mL/min — AB (ref >60)
Glucose, Bld: 140 mg/dL — ABNORMAL HIGH (ref 65–99)
Potassium: 3.7 mmol/L (ref 3.5–5.1)
Sodium: 144 mmol/L (ref 135–145)

## 2017-06-24 LAB — GLUCOSE, CAPILLARY
GLUCOSE-CAPILLARY: 149 mg/dL — AB (ref 65–99)
GLUCOSE-CAPILLARY: 139 mg/dL — AB (ref 65–99)
GLUCOSE-CAPILLARY: 145 mg/dL — AB (ref 65–99)
GLUCOSE-CAPILLARY: 125 mg/dL — AB (ref 65–99)
GLUCOSE-CAPILLARY: 130 mg/dL — AB (ref 65–99)

## 2017-06-24 LAB — CBC
HCT: 23.5 % — ABNORMAL LOW (ref 39.0–52.0)
HEMOGLOBIN: 7.5 g/dL — AB (ref 13.0–17.0)
MCH: 28.6 pg (ref 26.0–34.0)
MCHC: 31.9 g/dL (ref 30.0–36.0)
MCV: 89.7 fL (ref 78.0–100.0)
Platelets: 150 10*3/uL (ref 150–400)
RBC: 2.62 MIL/uL — AB (ref 4.22–5.81)
RDW: 20.3 % — ABNORMAL HIGH (ref 11.5–15.5)
WBC: 13.2 10*3/uL — ABNORMAL HIGH (ref 4.0–10.5)

## 2017-06-24 LAB — PROCALCITONIN: PROCALCITONIN: 3.29 ng/mL

## 2017-06-24 LAB — PROTIME-INR
INR: 5.53 — AB
PROTHROMBIN TIME: 49.3 s — AB (ref 11.4–15.2)

## 2017-06-24 LAB — LACTATE DEHYDROGENASE: LDH: 164 U/L (ref 98–192)

## 2017-06-24 MED ORDER — COLCHICINE 0.6 MG PO TABS
0.6000 mg | ORAL_TABLET | Freq: Every day | ORAL | Status: DC
  Administered 2017-06-24 – 2017-06-25 (×2): 0.6 mg
  Filled 2017-06-24 (×2): qty 1

## 2017-06-24 MED ORDER — FENTANYL CITRATE (PF) 100 MCG/2ML IJ SOLN
25.0000 ug | INTRAMUSCULAR | Status: DC | PRN
  Administered 2017-06-24 – 2017-06-25 (×8): 50 ug via INTRAVENOUS
  Filled 2017-06-24 (×9): qty 2

## 2017-06-24 MED ORDER — GLYCOPYRROLATE 0.2 MG/ML IJ SOLN
0.2000 mg | Freq: Four times a day (QID) | INTRAMUSCULAR | Status: DC
  Administered 2017-06-24 – 2017-06-25 (×5): 0.2 mg via INTRAVENOUS
  Filled 2017-06-24 (×5): qty 1

## 2017-06-24 NOTE — Progress Notes (Signed)
CSW met with patient, wife, mother and wife's parents at bedside along with Wadie Lessen, NP from [place]. Patient's wife and mother both shared feelings and lengthy discussion about patient's prognosis. Wife tearful sharing patient's responses regarding comfort measures. Patient's mother verbalizes lengthy hospitalization and understanding of recent decline. Family members appear to understand current decline and discussion around comfort measures. CSW provided supportive intervention and will continue to follow for support throughout hospitalization. Raquel Sarna, [place], [place]

## 2017-06-24 NOTE — Progress Notes (Signed)
CRITICAL VALUE ALERT  Critical Value: INR 5.53  Date & Time Notied: 06/24/17 16100607  Provider Notified: 06/24/17 0609  Orders Received/Actions taken:

## 2017-06-24 NOTE — Consult Note (Signed)
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  Measurement: Sacrum: 10cm x 8cm x 3cm  Wound bed: 80% yellow slough/20% pink  Drainage (amount, consistency, odor) minimal today at time of dressing removal, no odor Periwound: intact Patient has been receiving hydrotherapy M-Sat if able to tolerate since 06/12/17. Today I was able to assess wound with PT just prior to treatment. Patient status is declining and he moans and indicates pain with movement for treatment.  PT did ask wife and patient about treatment today, patient is not able to answer but wife wants to proceed.  Would is evolving now with hydrotherapy and will be a Stage 4 Pressure injury once all slough is cleared, bone is palpable. With declining status skin failure is not unexpected. Continue hydrotherapy as long as patient and/or wife/family indicates they would like it to be done. LALM in place for pressure redistribution. 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 They will continue to actively drain and healing these areas is most unlikely. Alginate and foam dressings can manage the drainage.  Compression wraps have been ordered over this, [facility]. May be for edema control per cardiology or LVAD team. Continue hydrotherapy, WOC will assess with PT weekly. PT to contact WOC nurse for acute changes or needs with wound care orders.  Zacharia Sowles [facility] MSN,RN,CWOCN, CNS, [employer] [phone]

## 2017-06-24 NOTE — Progress Notes (Signed)
Chaplain continuing to follow patient and family with long hospital stay complicated by worsening health issues.  Chaplain spoke with spouse and other family members of the patient to provided support and to allow space for them to reflect on current prognosis of the patient.  Family is sad and somber this afternoon.  Chaplain will continue to pray with and support this family.  Chaplain provided prayer, ministry of presence and reflective encouragement for family members.  Please page Chaplain as needed for this patient.    06/24/17 1226  Clinical Encounter Type  Visited With Patient and family together  Visit Type Follow-up;Psychological support;Spiritual support;Social support;Critical Care  Referral From [place];Family  Consult/Referral To Chaplain  Spiritual Encounters  Spiritual Needs Prayer

## 2017-06-24 NOTE — Progress Notes (Signed)
LVAD Inpatient Coordinator Rounding Note:  Admitted 04/10/2017 due to A/C Heart failure.   [facility] LVAD implanted on 03/22/2017 by Dr. Laneta SimmersBartle as DT VAD.  Transferred to 4E 05/17/17.  Transferred back to ICU 2H09 on 05/28/17 with possible aspiration pneumonia requiring re-intubation.  Pt is communicating that he is tired and is ready to go to heaven. Pt is tachypneic and has a large amount of audible secretions.     Vital signs: Temp: 98.6 HR: 71 Doppler: not done Auto BP: 110/83 (92) O2 Sat: 99% on 2 LNC Wt in lbs: Marland Kitchen... 217>223>224>224>234>240>245>235>234>236>239>234>232>232>232>233>238>266  LVAD interrogation reveals:  Speed: 2540 Flow: 4.1 - 4.5 Power: 3.4w Alarms: none Peak: 7.1 Trough: 3.1 HCT:  24  Low flow alarm setting: 2.5 High watt alarm setting: 6  Suction: OFF  Lavare cycle: on  Blood Products: 6/28> 5 PRBC's, 6 FFP 7/1> 1 PRBC 7/9> 1 PRBC 7/13>3 PRBC 7/14>3 PRBC 7/15> 1 PRBC 7/16>3PRBC 7/17> 3 PRBC 7/20> 1 PRBC 8/6> 1 PRBC 8/10 > 1 PRBC 8/22 > 1 PRBC  Gtts: Angiomax per pharmacy - switched to this from heparin due to platelets dropping  GI: 04/23/17 developed ileus with respiratory compromise - NG with 3 L suctioned out 05/03/17 - melena due to acute GI bleed -- PCs given as needed 04/29/2017 - 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  Neuro: 05/27/17- Neurology consult for lethargy, mouth tremors, not vocal, not following commands> Head CT and EEG 05/28/17 - Continual EEG monitoring  05/30/17 - started Depakote for seizure activity - stopped 06/03/17 for rash - will start Keppra   Arrhythmia: 04/24/17 - Afib with RVR - started amiodarone 05/02/17 - 2 sustained episodes of VT - cardioverted x 1; second broke with overdrive pacing 7/17- wide complex tachycardia- Amio bolus x3 and gtt at 60 mg/hr 05/07/17 - sustained VT - converted with overdrive pacing; Lidocaine started in addition to  amiodarone 05/08/17 - Lidocaine stopped due to confusion (lido level 12.4) 7/22 - Sustained VT with rate 130 after getting IV lasix. Pace terminated, back on IV amiodarone 05/14/17 - sustained VT - converted 3 separate occassions with overdrive pacing 05/19/17 - sustained VT-converted with overdrive pacing  8/1 - VT pace terminated 8/2 - VT started Sotalol 05/26/17- Afib rate in the 50's 05/27/17 - NSR   Respiratory: 04/18/17 - Extubated 04/23/17 - re-intubated due to respiratory failure secondary to suspected aspiration pneumonia 04/25/17-extubated 04/20/2017- intubated for EGD 05/20/2017- extubated 05/28/17 - re-intubated for suspected aspiration pneumonia 06/11/17 - one way extubation  Drive Line: Daily Dressing Kits with Aquacell AG silver strips per protocol -will advance to every other day dressing changes. Due 06/23/2017 VAD Coordinator will change the dressing tomorrow. May be able to advance to twice weekly dressing changes.   Labs:  LDH trend: .Marland Kitchen...158>156>156>146>154>188>142>138>127>128>148>164>175>170>187>175>164  INR trend: .... 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 He had epinephrine, APC,and 2 clips.  - 7/19 - Lidocaine gtt stopped due to confusion (lido toxicity) - 8/7- Neurology consult- lethargy, mouth tremors, not vocal, not following commands>Head CT and EEG, blood and urine cultures sent, Ammonia-20 -8/8 - re-intubated for possible aspiration  pneumonia   Plan/Recommendations:  1.  Currently every other day dressing changes.  2. Please page VAD pager for any equipment or patient needs. 3. Family meeting at 1:30 today with Palliative Care and family. Annice Pih our Child psychotherapist will attend this meeting.   Carlton Adam RN, VAD Coordinator 24/7 pager [phone]

## 2017-06-24 NOTE — Progress Notes (Signed)
OT Cancellation Note  Patient Details Name: James [name] MRN: [id number] DOB: [birth year]   Cancelled Treatment:    Reason Eval/Treat Not Completed: Medical issues which prohibited therapy.  Not medically appropriate at this time.  Alizey Noren [place], OTR/L [phone]   [name], [name] 06/24/2017, 1:02 PM

## 2017-06-24 NOTE — Progress Notes (Signed)
ANTICOAGULATION CONSULT NOTE - Follow Up Consult  Pharmacy Consult for Warfarin Indication: HVAD  Allergies  Allergen Reactions  . Depakote [Divalproex Sodium] Rash    Developed rash which resolved once med was removed    Patient Measurements: Height: 6' (182.9 cm) Weight: 266 lb (120.7 kg) IBW/kg (Calculated) : 77.6   Vital Signs: Temp: 99.6 F (37.6 C) (09/04 1252) Temp Source: Axillary (09/04 1252) BP: 76/55 (09/04 0713)  Labs:  Recent Labs  06/22/17 0253 06/23/17 0348 06/23/17 0513 06/24/17 0516  HGB 8.1* 8.0*  --  7.5*  HCT 25.6* 25.2*  --  23.5*  PLT 140* 137*  --  150  LABPROT 29.5* 40.5* 40.8* 49.3*  INR 2.83 4.24* 4.28* 5.53*  CREATININE 1.51* 1.83*  --  1.98*    Estimated Creatinine Clearance: 57.2 mL/min (A) (by C-G formula based on SCr of 1.98 mg/dL (H)).  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   Bival turned off 9/1, recheck INR was therapeutic but now continues to trend up to 5.53 despite holding yesterday's dose. LDH ok. Hgb down to 7.5, platelets recovering to 150.   HIT antibody was negative.   Goal of Therapy:  INR goal 2-2.5 (GIB this admit) Monitor platelets by anticoagulation protocol: Yes   Plan:  1) Hold warfarin tonight 2) Daily INR, CBC, LDH   Louie CasaJennifer Quartez [place], PharmD, BCPS 06/24/2017 1:09 PM

## 2017-06-24 NOTE — Progress Notes (Signed)
PT Cancellation Note  Patient Details Name: James [name] MRN: [id number] DOB: 1961-10-11   Cancelled Treatment:    Reason Eval/Treat Not Completed: Medical issues which prohibited therapy. Per RN, there is to be a palliative meeting at some point today. Will continue to follow for appropriateness of continued physical therapy services and goals of care.   Marylynn PearsonLaura D Koran Seabrook 06/24/2017, 11:59 AM   Conni SlipperLaura Bevelyn Arriola, PT, DPT Acute Rehabilitation Services Pager: [phone]

## 2017-06-24 NOTE — Progress Notes (Signed)
Patient ID: James FabianHarry D [name], male   DOB: May 22, 1961, 56 y.o.   MRN: [id number]  This NP visited patient at the bedside as a follow up for palliative needs and emotional support.  Patient's wife and LVAD team/[name]/[name] present at bedside.  Patient appears weak, voice is weak/soft,  audible throat secretions. Gentle suction for thick bloody secretions.   Continued discussion regarding continued decline and failure to thrive.  Patient is able to communicate to his wife's question  that he is "alittle bit" tired of fighting.  He communicates that the pain he experiences with every nursing intervention is causing suffering.  He communicates that he is not afraid in the current situation and that he is ready for heaven.  Family speaks to seeing grandma [name] when he gets here.  Wife is able to verbalize her understanding of the poor limited prognosis.  Focus is comfort and dignity. - will liberalize Fentanly  -add Robinol -place rectal tube   Wife asks me to retrun to discuss the situation with the patient's mother later today which I did.    Spoke to patient's mother freely regarding her son's continue decline and failure to thrive.  We spoke to patient's courage over the last many weeks as he tried to do everything he possibly could to make the LVAD therapy is successful.    We spoke to the limitations of medical interventions within the context of mortality. All agree that comfort and dignity are the priority of care at this time.  All family members verbalize an understanding of the  limited prognosis, patient is failing to thrive.  We spoke to that time will  come when decisions will need to be made to deescalate  life-prolonging measures and let God's will /nature take its course.  Plan is to continue current medical interventions alongside all medical interventions to promote comfort.  Discussed with family the importance of continued conversation with the   medical providers regarding  overall plan of care and treatment options,  ensuring decisions are within the context of the patients values and GOCs.  Questions and concerns addressed    palliative medicine team will continue to support holistically.  Discussed with bedside nurse/Whitney  Total time spent on the unit was 45 minutes. Time in   1300    time out 1345  Greater than 50% of the time was spent in counseling and coordination of care  Lorinda CreedMary Alyxandra Tenbrink NP  Palliative Medicine Team Team Phone # [phone] Pager [phone]

## 2017-06-24 NOTE — Progress Notes (Signed)
Wounds: Left leg has three separate areas . Anterior shin area with deep tissue injury unstageable. Left posterior calf with deep tissue injury also. Medial left calf area with hard spot discolored, unstageable deep tissue. Right posterior laf with blister opened and subsequent oozing red.  cleaned and all dressings changed.   Suctioning every 2-3 hours npharengeal. Last 2 times, getting red tinged back with clot at the end. CVP 8-10 wife at bedside. Full bath given . Updated her on condition.

## 2017-06-24 NOTE — Clinical Social Work Note (Signed)
CSW continues to follow for discharge needs.  Aryaa Bunting, CSW [phone]  

## 2017-06-24 NOTE — Progress Notes (Signed)
Patient ID: James [name], male   DOB: [birth year], 56 y.o.   MRN: [id number]   Advanced Heart Failure VAD Team Note  Subjective:    Events: --HVAD placed 6/28.  Returned to the OR that evening with high chest tube output, evacuation of mediastinal hematoma.  --Extubated 6/29. Milrinone stopped on 7/4. --7/4, patient developed ileus with respiratory compromise. NGT placed with 3 L suctioned out.  CXR with suspicion for aspiration PNA.  Patient had to be intubated.  He went into atrial fibrillation with RVR.  He became hypotensive and was started on norepinephrine and phenylephrine.  Amiodarone gtt begun.    --Extubated again on 7/6.  -- 7/13 Had 2 sustained episodes of VT. Had to be cardioverted 1. Second episode broke with overdrive pacing with Dr. Caryl Comes. VAD speed turned down to 2700.  --Melena due to acute GI bleed --> 2 units PRBCs 7/14, 2 units 7/15. 3 U PRBCs 7/16,  05/11/2017 3 UPRBCs.  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  He had epinephrine, APC, and 2 clips. Intubated prior to procedure and placed on norepinephrine. Speed dropped to 2660.  --7/18 Back in VT with overdrive pacing. Lidocaine was started in addition to amiodarone. Extubated. --7/19 lidocaine stopped with confusion. Neuro consulted.  EEG normal.  CT of head no acute findings. 7/20 confusion had resolved.  --1 unit PRBCs 7/20.  --7/22, developed sustained VT with rate around 130 shortly after getting IV Lasix.  We were able to pace him out and back to NSR.  He was put back on amiodarone gtt.  --More VT on 7/25, paced out.  Back on IV amiodarone gtt.  NSR.  -- 7/31 Ramp ECHO decrease speed 2600 rpm.  -- 8/1 VT- paced out -- 8/2 VT - started on sotalol -- 8/6 Hgb 7.5, 1 unit PRBCs -- 8/7 Hgb 9.5, confused.  Head CT with right superior cerebellar artery lacunar infarcts, not acute. -- 8/8 Possible aspiration. Reintubated. -- 8/10 possible seizure => Depakote.  CT head no  change.  1 unit PRBCs.  -- Multiple suction alarms on 8/17 night which persisted so suction alarm deactivated.  -- Extubated 8/23 -- 9/1 got 1 unit PRBCs  MAP 70s-90.  Remains NPO due to pharyngeal muscle weakness.  CVP 10 today. He is awake and responds to questioning with yes/no answers.  Having pain at decubitus site.   CXR 9/3 with bibasilar atelectasis.   Fever 9/3, started on vanco/Zosyn.  Increased secretions, urine also concerning for UA.  Afebrile this morning.  WBCs down to 13 from 15 today.  BUN/creatinine rising.    HIT negative, plts stabilized.  INR high now, holding warfarin.   Has been getting hydrotherapy.   HVAD INTERROGATION:  HVAD:  Flow 4.4 liters/min, speed 2450,  power 3.7 W,  Peak 7 Trough 3. Suction alarms off. Lavare on.   Objective:    Vital Signs:   Temp:  [99 F (37.2 C)-101.9 F (38.8 C)] 99.4 F (37.4 C) (09/04 0312) Pulse Rate:  [70] 70 (09/03 1522) Resp:  [26-37] 35 (09/04 0600) BP: (79-85)/(62-66) 85/66 (09/03 1522) SpO2:  [95 %-99 %] 95 % (09/04 0600) Weight:  [266 lb (120.7 kg)] 266 lb (120.7 kg) (09/04 0312) Last BM Date: 06/23/17 Mean arterial Pressure 70s-90 Intake/Output:   Intake/Output Summary (Last 24 hours) at 06/24/17 0748 Last data filed at 06/24/17 0600  Gross per 24 hour  Intake  800 ml  Output              725 ml  Net               75 ml     Physical Exam    Physical Exam: CVP 10 GENERAL: Fatigued and chronically ill appearing. NAD.  HEENT: Normal. NECK: Supple, JVP 8-9cm. Carotids OK.  CARDIAC: Mechanical heart sounds with LVAD hum present.  LUNGS: Coarse breath sounds bilaterally.  ABDOMEN: NT, ND, no HSM. No bruits or masses. +BS LVAD exit site: Dressing dry and intact. No erythema or drainage. Stabilization device present and accurately applied. Driveline dressing changed daily per sterile technique. EXTREMITIES: Warm and dry. No cyanosis or clubbing. Diffuse gouty abnormalities. LEs wrapped.  1+ edema into thighs.  NEUROLOGIC: Awake, answers simple questions.   Telemetry   Personally reviewed, NSR 70s   Labs   Basic Metabolic Panel:  Recent Labs Lab 06/18/17 0400  06/20/17 0445 06/21/17 0329 06/22/17 0253 06/23/17 0348 06/24/17 0516  NA 153*  < > 153* 150* 147* 147* 144  K 3.2*  < > 3.4* 3.5 3.7 3.9 3.7  CL 122*  < > 123* 122* 118* 117* 116*  CO2 26  < > _0 GLUCOSE 163*  < > 169* 136* 165* 127* 140*  BUN 48*  < > 50* 55* 64* 70* 74*  CREATININE 1.17  < > 1.27* 1.33* 1.51* 1.83* 1.98*  CALCIUM 8.0*  < > 8.0* 7.8* 7.7* 7.5* 7.1*  MG 1.6*  --   --   --  1.8  --   --   < > = values in this interval not displayed.  Liver Function Tests: No results for input(s): AST, ALT, ALKPHOS, BILITOT, PROT, ALBUMIN in the last 168 hours. No results for input(s): LIPASE, AMYLASE in the last 168 hours. No results for input(s): AMMONIA in the last 168 hours.  CBC:  Recent Labs Lab 06/20/17 0445 06/21/17 0329 06/22/17 0253 06/23/17 0348 06/24/17 0516  WBC 10.6* 9.7 12.5* 15.2* 13.2*  HGB 8.0* 7.8* 8.1* 8.0* 7.5*  HCT 26.1* 24.4* 25.6* 25.2* 23.5*  MCV 93.5 91.0 89.8 90.6 89.7  PLT 144* 131* 140* 137* 150    INR:  Recent Labs Lab 06/21/17 1353 06/22/17 0253 06/23/17 0348 06/23/17 0513 06/24/17 0516  INR 2.49 2.83 4.24* 4.28* 5.53*   Other results:    Imaging   Dg Chest Port 1 View  Result Date: 06/23/2017 CLINICAL DATA:  Indwelling left ventricular assist device. Follow-up basilar atelectasis. EXAM: PORTABLE CHEST 1 VIEW COMPARISON:  06/22/2017, 06/12/2017 and earlier. FINDINGS: LV assist device unchanged. Left subclavian single lead transvenous pacemaker unchanged and appears intact. Feeding tube courses below the diaphragm into the stomach though its tip is not included on the image. Right arm PICC tip projects at or near the cavoatrial junction, unchanged. Markedly suboptimal inspiration with bibasilar atelectasis, unchanged. Lungs otherwise  clear. Mild pulmonary venous hypertension without overt edema. Cardiac silhouette markedly enlarged, unchanged. IMPRESSION: 1.  Support apparatus satisfactory. 2. Markedly suboptimal inspiration which accounts for bibasilar atelectasis, stable. No acute cardiopulmonary disease otherwise. Electronically Signed   By: Evangeline Dakin M.D.   On: 06/23/2017 09:20   Dg Chest Port 1 View  Result Date: 06/22/2017 CLINICAL DATA:  Followup left ventricular assist device. EXAM: PORTABLE CHEST 1 VIEW COMPARISON:  06/12/2017. FINDINGS: Poor inspiration. Stable enlarged cardiac silhouette, post CABG changes and left ventricular assist device. Stable left subclavian AICD lead. Feeding tube  extending into the distal stomach. Right PICC tip in the right atrium. Resolved left lower lobe airspace opacity. Minimal right basilar atelectasis. Lower thoracic spine degenerative changes. Bilateral shoulder degenerative changes, right shoulder fixation anchor and superior migration of both humeral heads with bony remodeling. IMPRESSION: 1. Stable left ventricular assist device. 2. Poor inspiration with minimal right basilar atelectasis. 3. Right PICC tip in the right atrium. This could be retracted 4 cm to place it at the superior cavoatrial junction. 4. Bilateral shoulder degenerative changes and evidence of large, chronic bilateral rotator cuff tears. Electronically Signed   By: Claudie Revering M.D.   On: 06/22/2017 13:35    Medications:    Scheduled Medications: . amiodarone  200 mg Per Tube Daily  . amLODipine  10 mg Oral BID  . chlorhexidine gluconate (MEDLINE KIT)  15 mL Mouth Rinse BID  . Chlorhexidine Gluconate Cloth  6 each Topical q morning - 10a  . colchicine  0.6 mg Per Tube Daily  . collagenase   Topical Daily  . doxazosin  4 mg Oral Q12H  . febuxostat  120 mg Oral Daily  . feeding supplement (NEPRO CARB STEADY)  1,000 mL Per Tube Q24H  . feeding supplement (PRO-STAT SUGAR FREE 64)  60 mL Per Tube BID  . free  water  400 mL Per Tube Q4H  . hydrALAZINE  100 mg Oral Q8H  . insulin aspart  0-15 Units Subcutaneous Q4H  . levETIRAcetam  500 mg Per Tube BID  . levothyroxine  50 mcg Per NG tube QAC breakfast  . magnesium oxide  400 mg Per Tube Daily  . mouth rinse  15 mL Mouth Rinse Q4H  . mexiletine  300 mg Per Tube Q12H  . pantoprazole sodium  40 mg Per Tube BID  . sodium chloride flush  10-40 mL Intracatheter Q12H  . sotalol  80 mg Per Tube Daily  . spironolactone  25 mg Oral Daily  . Warfarin - Pharmacist Dosing Inpatient   Does not apply q1800    Infusions: . sodium chloride 10 mL (06/22/17 1956)  . piperacillin-tazobactam (ZOSYN)  IV Stopped (06/24/17 0200)  . vancomycin Stopped (06/23/17 2320)    PRN Medications: sodium chloride, acetaminophen (TYLENOL) oral liquid 160 mg/5 mL, albuterol, docusate, fentaNYL (SUBLIMAZE) injection, Gerhardt's butt cream, hydrALAZINE, neomycin-bacitracin-polymyxin, ondansetron (ZOFRAN) IV, oxyCODONE, pneumococcal 23 valent vaccine, silver nitrate applicators, sodium chloride flush   Patient Profile   56 yo with CAD s/p CABG, ischemic cardiomyopathy/chronic systolic CHF, tophaceous gout, and CKD stage 3 was admitted for diuresis and consideration for LVAD placement. S/p HVAD on 6/28  Assessment/Plan:    1. Acute/chronic systolic CHF s/p HVAD: Ischemic cardiomyopathy.  St Jude ICD.  Echo (6/18) with EF 15%, mildly dilated RV with moderately decreased systolic function.  s/p HVAD placement 6/28 and had to return to OR to evacuate mediastinal hematoma.  He had been weaned off pressors/milrinone, but developed ileus w/ likely aspiration event 7/4, re-intubated, developed afib/RVR requiring norepinephrine. Extubated 7/6.  VAD speed turned down to 2540 with VT. VAD parameters currently look good.  Milrinone stopped 7/17 with recurrent VT.  He was started back on norepinephrine 8/9 due to soft BP after intubation and suspected septic shock, now off norepinephrine.  MAP  finally improved to goal, getting hydralazine per tube as well as amlodipine, doxazosin, and spironolactone.  Flow higher with improved BP. He has had massive 3rd spacing.  Legs now wrapped.  Today, CVP 10. Now with rise in BUN/creatinine and fever  on 9/3, concerned for developing sepsis.  - Leave off diuretics.    - Unna boots in place.  - Continue antihypertensive regimen for now, will need to hold meds if MAP trends down (still up to 90s at times).    - Off ASA with GI Bleed  - Goal INR 2-2.5, warfarin on hold with high INR.  2. AKI on CKD stage 3: BUN/creatinine up, concern for developing sepsis.  Stop Lasix (got dose today), will give NS bolus x 1.  3. Symptomatic anemia due to acute UGI bleeding:  Received 3 units PRBCs 7/16 and 3 units PRBCs on 05/08/2017. 1 unit PRBCs 7/20. 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  Requiring 2 clips + epi + APC. 1 unit PRBCs 8/6.  He had 1 unit PRBCs 8/10 to provide volume.  1 unit PRBCs on 8/22 and again 9/1.  No overt bleeding.  Hgb 7.5 today.  - Will hold off on transfusion until hgb < 7.  - Got Feraheme 7/28.  - Continue Protonix 40 po bid.  4. Ventricular tachycardia: Status post ventricular tachycardia 2 on 7/13. Possible suction event. VAD speed turned down to 2700.  Recurrent VT 7/17. EP called to bedside. Overdrive pacing successful for about 10 minutes but VT recurred.  Milrinone turned off, remained in VT overnight but hemodynamically stable.  Speed decreased to 2600.  On 7/18, we were able to pace him out of VT.  Lidocaine added then stopped due to confusion/mental status changes.  He had VT 7/22 about 30 minutes after getting IV Lasix, had to be paced out again => may have been due to rapid fluid shift.  Pt paced out of VT 3 separate occasions 7/25. Got amio bolus x 2 and started back on IV infusion.  Transitioned to amiodarone 400 mg tid.  On 8/1 had recurrent VT and paced out again.  Recurrent VT 8/2 per EP sotalol  started along with amiodarone, sotalol decreased to once daily with bradycardia. Remains quiescent.  - Continue mexiletine to 300 mg BID, sotalol 80 mg daily.  - Continue amiodarone 200 mg daily.  - Keep K > 4.0 Mg > 2.0  5. Paroxysmal Atrial fibrillation:  Developed atrial fibrillation with RVR in setting of aspiration PNA on 7/4.  In NSR currently on triple AA therapy. No change.  6. Malnutrition:  Continue tube feeds via Cor-track. Nutrition following. No change.  7. Aspiration PNA with acute hypoxemic respiratory failure on 7/4: He was extubated on 7/6. Tracheal aspirate with Klebsiella and citrobacter. Both sensitive to Zosyn. Completed abx 7/13. Possible aspiration 8/8. Treated with meropenem. Stopped 8/24 - He remains NPO, everything via Cor-track.  May end up needing PEG if he survives current illness.  8. Acute Respiratory Failure: Intubated 7/17 in setting of complex GI intervention. Extubated 7/18. Re-intubated 8/8.  Weak respiratory muscles. He has not wanted a tracheostomy.  Extubated on 8/22.  CXR 9/3 with bibasilar atelectasis.  - Increased secretions requiring frequent suctioning.  - DNI, would not re-intubate.  9. CAD: s/p CABG 2012. No chest pain.  Off aspirin due to GI bleeding. No change.  10. Gout: Severe tophaceous gout.  - Continue uloric.  - Restart colchicine once daily with worsening gouty pain.   11. Delirium: Appreciate neuro input Possibly related to lidocaine.  Lidocaine stopped. CT of head negative. EEG ok. No further workup per neuro. Neuro  reconsulted 8/7 with AMS => ?related to fever/infection.  He has been more clear  on vent.  CT head 05/27/17 and 8/10 with lacunar infarcts but do not appear acute.  Seizure activity versus myoclonus => Depakote started.  Switched to [place] with ?drug rash from Depakote.  He was awake/alert this morning. 12. UTI: UA 9/3 concerning for UTI, pending culture.  13. Unstageable Pressure Ulcer: Sacrum. Santyl daily to enzymatically  debride. Reposition R to L.  - Continue with hydrotherapy. No change. Appreciate WOC care.  14. Bradycardia: With hypotension.  Currently back-up pacing set at 60 bpm.  Does not have atrial lead so pacing asynchronously. Currently pacing only rarely.    15. Hypothyroidism: May be related to amiodarone, he is on Levoxyl. No change.    16. ID: Course of meropenem was finished 8/24.  Now with recurrent fever 9/1 with increasing lethargy and secretions.  Lung source versus wound source versus UTI.  CXR with bibasilar atelectasis. PCT elevated at 3.29.  - Continue suctioning frequently, he is DNI at this point.  - Send blood, sputum, and urine cultures. Awaiting culture data.  - Started vancomycin/Zosyn empirically.  18. Severe Deconditioning: Critical care myopathy. Continue PT. CIR MD James, not yet appropriate. PT on hold again with fever and lethargy.  19. Hypernatremia: Resolved. Continue free water boluses 400 q4 hrs for now. 20. Thrombocytopenia: Mild but plts dropped 50% over a few days. HIT negative.  Now off bivalirudin with stable plts. 21. Pharyngeal muscle weakness: Remains NPO with tube feeds.  Speech/swallow following.   Patient has worsened overall, increased secretions. Suspect infection/sepsis.  Empiric abx.  Would not intubate again. Discussed with wife this morning.   I reviewed the HVAD parameters from today, and compared the results to the patient's prior recorded data.  No programming changes were made.  The HVAD is functioning within specified parameters.    CRITICAL CARE Performed by: Loralie Champagne  Total critical care time: 35 minutes  Critical care time was exclusive of separately billable procedures and treating other patients.  Critical care was necessary to treat or prevent imminent or life-threatening deterioration.  Critical care was time spent personally by me on the following activities: development of treatment plan with patient and/or surrogate as well as  nursing, discussions with consultants, evaluation of patient's response to treatment, examination of patient, obtaining history from patient or surrogate, ordering and performing treatments and interventions, ordering and review of laboratory studies, ordering and review of radiographic studies, pulse oximetry and re-evaluation of patient's condition.  Loralie Champagne, MD 06/24/2017, 7:48 AM  VAD Team --- VAD ISSUES ONLY--- Pager [phone] (7am - 7am) Advanced Heart Failure Team  Pager [phone] (M-F; 7a - 4p)  Please contact [facility] for night-coverage after hours (4p -7a ) and weekends on [email or web address]

## 2017-06-24 NOTE — Progress Notes (Signed)
Physical Therapy Wound Treatment Patient Details  Name: James [name] MRN: [id number] Date of Birth: 1960-12-20  Today's Date: 06/24/2017 Time: 1105-1150 Time Calculation (min): 45 min  Subjective  Subjective: Pt nonverbal throughout session. Moaned in pain with rolling/positioning. Patient and Family Stated Goals: None stated  Pain Score:  Pt was premedicated. Moaning intermittently.  Wound Assessment  Pressure Injury 05/14/17 Unstageable - Full thickness tissue loss in which the base of the ulcer is covered by slough (yellow, tan, gray, green or brown) and/or eschar (tan, brown or black) in the wound bed. WOC updated, wound evolution to unstageable  (Active)  Dressing Type Moist to dry 06/24/2017 10:00 AM  Dressing Changed 06/24/2017 10:00 AM  Dressing Change Frequency Daily 06/24/2017 10:00 AM  State of Healing Non-healing 06/24/2017 10:00 AM  Site / Wound Assessment Clean;Dry 06/24/2017 10:00 AM  % Wound base Red or Granulating 25% 06/24/2017 10:00 AM  % Wound base Yellow/Fibrinous Exudate 70% 06/24/2017 10:00 AM  % Wound base Black/Eschar 5% 06/24/2017 10:00 AM  % Wound base Other/Granulation Tissue (Comment) 0% 06/24/2017 10:00 AM  Peri-wound Assessment Intact 06/24/2017 10:00 AM  Wound Length (cm) 6.5 cm 06/20/2017  3:03 PM  Wound Width (cm) 8 cm 06/20/2017  3:03 PM  Wound Depth (cm) 3.6 cm 06/20/2017  3:03 PM  Tunneling (cm) 0 06/16/2017  8:00 PM  Undermining (cm) 5.0 cm 4:00-6:00, 2.6 cm at 12:00. 2.0 cm 10:00-12:00 06/20/2017  3:03 PM  Margins Unattached edges (unapproximated) 06/24/2017 10:00 AM  Drainage Amount Moderate 06/24/2017 10:00 AM  Drainage Description Serosanguineous;No odor 06/24/2017 10:00 AM  Treatment Hydrotherapy (Pulse lavage) 06/24/2017 10:00 AM   Santyl applied to wound bed prior to applying dressing.     Hydrotherapy Pulsed lavage therapy - wound location: sacral Pulsed Lavage with Suction (psi): 12 psi Pulsed Lavage with Suction - Normal Saline Used: 1000 mL Pulsed Lavage Tip:  Tip with splash shield Selective Debridement Selective Debridement - Location: sacral Selective Debridement - Tools Used: Forceps;Scissors Selective Debridement - Tissue Removed: yellow and black necrotic tissue   Wound Assessment and Plan  Wound Therapy - Assess/Plan/Recommendations Wound Therapy - Clinical Statement: Noting some areas that are appearing dark - initially appeared red and possibly perfused, however once debriding began, appears more necrotic with little to no blood flow to the area that was debrided. Wound Therapy - Functional Problem List: Relative immobility, decreased strength, compromised nutrition and skin integrity.  See PT notes Factors Delaying/Impairing Wound Healing: Incontinence;Infection - systemic/local;Immobility;Multiple medical problems Hydrotherapy Plan: Debridement;Dressing change;Patient/family education;Pulsatile lavage with suction Wound Therapy - Frequency: 6X / week Wound Therapy - Current Recommendations: PT;Case manager/social work;WOC nurse Wound Therapy - Follow Up Recommendations: Home health RN;Skilled nursing facility Wound Plan: see above  Wound Therapy Goals- Improve the function of patient's integumentary system by progressing the wound(s) through the phases of wound healing (inflammation - proliferation - remodeling) by: Decrease Necrotic Tissue to: 25 % Decrease Necrotic Tissue - Progress: Progressing toward goal Increase Granulation Tissue to: 75% including any viable tissues Increase Granulation Tissue - Progress: Progressing toward goal Improve Drainage Characteristics: Min;Serous Improve Drainage Characteristics - Progress: Progressing toward goal Goals/treatment plan/discharge plan were made with and agreed upon by patient/family: Yes Time For Goal Achievement: 7 days Wound Therapy - Potential for Goals: Good  Goals will be updated until maximal potential achieved or discharge criteria met.  Discharge criteria: when goals achieved,  discharge from hospital, MD decision/surgical intervention, no progress towards goals, refusal/missing three consecutive treatments without notification or medical reason.  GP     Thelma Comp 06/24/2017, 12:06 PM   Rolinda Roan, PT, DPT Acute Rehabilitation Services Pager: [phone]

## 2017-06-25 LAB — URINE CULTURE

## 2017-06-25 LAB — BASIC METABOLIC PANEL
Anion gap: 4 — ABNORMAL LOW (ref 5–15)
BUN: 81 mg/dL — ABNORMAL HIGH (ref 6–20)
CALCIUM: 7.1 mg/dL — AB (ref 8.9–10.3)
CO2: 22 mmol/L (ref 22–32)
Chloride: 116 mmol/L — ABNORMAL HIGH (ref 101–111)
Creatinine, Ser: 2.16 mg/dL — ABNORMAL HIGH (ref 0.61–1.24)
GFR calc Af Amer: 38 mL/min — ABNORMAL LOW (ref >60)
GFR calc non Af Amer: 33 mL/min — ABNORMAL LOW (ref >60)
GLUCOSE: 102 mg/dL — AB (ref 65–99)
Potassium: 3.7 mmol/L (ref 3.5–5.1)
Sodium: 142 mmol/L (ref 135–145)

## 2017-06-25 LAB — CBC
HCT: 23.3 % — ABNORMAL LOW (ref 39.0–52.0)
HEMOGLOBIN: 7.4 g/dL — AB (ref 13.0–17.0)
MCH: 28.5 pg (ref 26.0–34.0)
MCHC: 31.8 g/dL (ref 30.0–36.0)
MCV: 89.6 fL (ref 78.0–100.0)
Platelets: 153 10*3/uL (ref 150–400)
RBC: 2.6 MIL/uL — AB (ref 4.22–5.81)
RDW: 20.4 % — ABNORMAL HIGH (ref 11.5–15.5)
WBC: 13.9 10*3/uL — ABNORMAL HIGH (ref 4.0–10.5)

## 2017-06-25 LAB — PROCALCITONIN: PROCALCITONIN: 3.39 ng/mL

## 2017-06-25 LAB — VANCOMYCIN, TROUGH: VANCOMYCIN TR: 32 ug/mL — AB (ref 15–20)

## 2017-06-25 LAB — PROTIME-INR
INR: 5.41 — AB
PROTHROMBIN TIME: 49 s — AB (ref 11.4–15.2)

## 2017-06-25 LAB — GLUCOSE, CAPILLARY
GLUCOSE-CAPILLARY: 97 mg/dL (ref 65–99)
Glucose-Capillary: 104 mg/dL — ABNORMAL HIGH (ref 65–99)
Glucose-Capillary: 141 mg/dL — ABNORMAL HIGH (ref 65–99)

## 2017-06-25 LAB — LACTATE DEHYDROGENASE: LDH: 164 U/L (ref 98–192)

## 2017-06-25 MED ORDER — AMLODIPINE BESYLATE 10 MG PO TABS
10.0000 mg | ORAL_TABLET | Freq: Every day | ORAL | Status: DC
  Administered 2017-06-25: 10 mg via ORAL
  Filled 2017-06-25: qty 1

## 2017-06-25 MED ORDER — DOXAZOSIN MESYLATE 4 MG PO TABS
2.0000 mg | ORAL_TABLET | Freq: Two times a day (BID) | ORAL | Status: DC
  Administered 2017-06-25: 2 mg via ORAL
  Filled 2017-06-25: qty 1

## 2017-06-25 MED ORDER — SODIUM CHLORIDE 0.9 % IV SOLN
25.0000 ug/h | INTRAVENOUS | Status: DC
  Administered 2017-06-25: 25 ug/h via INTRAVENOUS
  Filled 2017-06-25: qty 50

## 2017-06-25 MED ORDER — SODIUM CHLORIDE 0.9 % IV BOLUS (SEPSIS)
500.0000 mL | Freq: Once | INTRAVENOUS | Status: AC
  Administered 2017-06-25: 500 mL via INTRAVENOUS

## 2017-06-28 LAB — CULTURE, BLOOD (ROUTINE X 2)
CULTURE: NO GROWTH
CULTURE: NO GROWTH
SPECIAL REQUESTS: ADEQUATE
Special Requests: ADEQUATE

## 2017-06-29 ENCOUNTER — Encounter (HOSPITAL_COMMUNITY): Payer: Self-pay | Admitting: Adult Health

## 2017-07-21 NOTE — Progress Notes (Signed)
   Called by nursing staff regarding low MAPS.   Maps down to 30s. Currently on fentanyl 100 mcg.   Opens eyes but non nonverbal. Does not his head when asked if he is in pain.   Given 500 cc NS bolus.   We called his wife with deterioration. Chaplain at that bedside.   Dr Shirlee LatchMcLean at the bedside. Plan to move to comfort when his wife is in the room.  Quinn Quam NP-C 1:14 PM

## 2017-07-21 NOTE — Progress Notes (Signed)
   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    They wish to move to full comfort care. All nonessential meds stopped.   Coretrak removed per family request.   Continue fentanyl 200 mcg. Plan to discontinue HVAD after his son arrives.   Sargon Scouten NP-C  2:45 PM'

## 2017-07-21 NOTE — Progress Notes (Signed)
PT Cancellation Note  Patient Details Name: James FabianHarry D Borys MRN: [id number] DOB: 1961-09-21   Cancelled Treatment:    Reason Eval/Treat Not Completed: Medical issues which prohibited therapy (Still checking in for pt to work with PT. Not alert enough.)   Berline LopesDawn F Cainan Trull 07/13/2017, 12:48 PM  Lynk Marti,PT Acute Rehabilitation [phone] (pager)

## 2017-07-21 NOTE — Progress Notes (Addendum)
Chaplain journeying alongside this patient, [name], LVAD [name], and entire medical team as we provide as best we can the support and love for James [name].  Ministry of presence and calm assurance for all.  Grateful to be here today and would like to express gratitude to the entire team of professionals working with this patient and [name].  May our Creator comfort and bless you all.

## 2017-07-21 NOTE — Progress Notes (Signed)
ANTICOAGULATION CONSULT NOTE - Follow Up Consult  Pharmacy Consult for Warfarin and Vancomycin/Zosyn Indication: HVAD and sepsis/UTI  Allergies  Allergen Reactions  . Depakote [Divalproex Sodium] Rash    Developed rash which resolved once med was removed    Patient Measurements: Height: 6' (182.9 cm) Weight: 260 lb (117.9 kg) IBW/kg (Calculated) : 77.6   Vital Signs: Temp: 99 F (37.2 C) (09/05 1132) Temp Source: Axillary (09/05 1132) BP: 92/64 (09/05 0811) Pulse Rate: 60 (09/05 1132)  Labs:  Recent Labs  06/23/17 0348 06/23/17 0513 06/24/17 0516 06/29/2017 0344  HGB 8.0*  --  7.5* 7.4*  HCT 25.2*  --  23.5* 23.3*  PLT 137*  --  150 153  LABPROT 40.5* 40.8* 49.3* 49.0*  INR 4.24* 4.28* 5.53* 5.41*  CREATININE 1.83*  --  1.98* 2.16*    Estimated Creatinine Clearance: 51.8 mL/min (A) (by C-G formula based on SCr of 2.16 mg/dL (H)).  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   Bival turned off 9/1, recheck INR was therapeutic but now continues to be elevated at 5.41 despite holding doses the last 2 days. LDH ok. Hgb down to 7.3, platelets recovering to 153.   HIT antibody was negative.   He also continues on vancomycin and zosyn for sepsis/UTI. Urine culture with pseudomonas. Vancomycin trough drawn today (~1 hour late) is supratherapeutic at 32 on 750mg  q12. Renal function slowly worsening. Zosyn dose remains appropriate.  7/7 VT 39 8/12 VT 30 - hold 8/13 VR 30 - hold 8/14 VR 25 - hold 8/15 VR 18>>750 x1 8/17 VR 15 (48 hrs after dose) > 750 mg x 1 8/18 VR 17 (24 hrs after dose) > 750 mg q 24 hrs 9/5 VT 32 on 750mg  q12 > hold, recheck in AM  Zosyn 7/5 >> 7/12, 9/3>> 8/7>8/8 ctx Vanc 7/5>7/8, 8/8>> 8/23, 9/3>> 8/8 merrem>>8/24  7/5 blood x2>> neg 7/5 TA > rare GNR>>reincubate>few klebsiella/citrobacter - pan sensitive 7/7 TA>rare gnr/gpc>>kleb pneumo - r amp otherwise pan sensitive 7/26  MRSA pcr NEG !!>>precautions d/c'ed after 30d 8/7 bld>>neg 8/7 urine>>ng 8/8 TA>>(lots of secretions on intubation per ccm) rare gpc/gpr 8/9 urine >ng 8/11 BCx > ngF 8/11 TA > normal flora 8/12 BCx>neg 9/3 urine > GNR > pseudomonas (S-zosyn) 9/3 blood x2 >>   Goal of Therapy:  Vancomycin trough goal 15-20 INR goal 2-2.5 (GIB this admit) Monitor platelets by anticoagulation protocol: Yes   Plan:  1) Hold warfarin tonight 2) Daily INR, CBC, LDH 3) Hold vancomycin for now, check another level in the morning 4) Continue zosyn 3.375g IV q8 (EI)   Louie CasaJennifer Bolden Hagerman, PharmD, BCPS 07/16/2017 11:52 AM

## 2017-07-21 NOTE — Progress Notes (Signed)
At 1505, patient passed with family at bedside. LVAD team present, HVAD turned off. [facility] notified at [phone].

## 2017-07-21 NOTE — Discharge Summary (Signed)
Advanced Heart Failure Death Summary  Death Summary   Patient ID: James [name] MRN: [id number], DOB/AGE: [birth year] 56 y.o. Admit date: 03/26/2017 D/C date:    07/09/2017    Primary Discharge Diagnoses:  1. 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 S/P HVAD 3. AKI on CKD 4. Symptomatic Anemia due to Acute GI bleed 5. Ventricular Tachycardia 6. PAF 7. Malnutrition 8. Aspiration PNA 9. CAD 10. Gout 11. Delirium  12. UTI 13. Unstageable Pressure Ulcer 14. Bradycardia 15. Hypothyroidism 16. ID 17. Severe Deconditioning  18. Hypernatremia 19 Thrombocytopenia 20. Pharyngeal Muscle Weakness 21. Pain 22. DNR/DNI   Hospital Course:  Mr Venn is a 56 yo with CAD s/p CABG, ischemic cardiomyopathy/chronic systolic CHF, tophaceous gout, and CKD stage 3, admitted for diuresis and consideration for LVAD placement.   He underwent VAD work up and deemed appropriate for HVAD. He was evaluated by CT surgery, palliative care, and social work. After discussion at [facility] he was deemed appropriate for HVAD under DT therapy. He was discussed at [facility] Transplant team and approved for HVADimplantation under DT therapy. He was optimized prior to surgery.   On June 28th he underwent HVAD under DT therapy. He had a prolonged prolonged hospitalization and with many complications post operatively.   Post operative complications included VT, GI bleed, respiratory failure, aspiration pneumonia, acute gout, and sacral pressure ulcer. He had multiple set backs and required reintubation on 2 separate occasions. He was extubated on 8/23 and requested no further reintubation. Due to multiple complications Palliative Care consulted and met with him and his wife. At the conclusion of the meeting he requested DNR/DNI. He had progressive decline with inability to clear his secretions.  Pro calcitonin started to rise despite antibiotics. Due to pain from sacral pressure ulcer he was requiring IV fentanyl. On  September 5th he had significant pain from sacral pressure and gout so he was placed on fentanyl drip. He later developed hypotension followed by low flows on HVAD. Dr Aundra Dubin had extensive conversations with his wife and family and he transitioned to full comfort care. He later passed quietly with his family at the bedside. See below for further details regarding hospital course.    1. Acute/chronic systolic CHF s/p HVAD: Ischemic cardiomyopathy. [employer] ICD. Echo (6/18) with EF 15%, mildly dilated RV with moderately decreased systolic function.  s/p HVAD placement 6/28 and had to return to OR to evacuate mediastinal hematoma.  He had been weaned off pressors/milrinone, but developed ileus w/ likely aspiration event on 7/4 and required re-intubated and developed afib/RVR requiring norepinephrine. He was later extubated 7/6.  VAD speed turned down to 2540 with VT.  Milrinone stopped 7/17 with recurrent VT.  He was started back on norepinephrine 8/9 due to soft BP after intubation and suspected septic shock, and later weaned off. Maps followed closey but as he declined HTN meds stopped. ASA stopped due to GI Bleed  2. AKI on CKD stage 3: Creatinine peaked at 2.6. Renal function was followed closely.   3. Symptomatic anemia due to acute UGI bleeding:  Received 3 units PRBCs 7/16 , 3 units PRBCs on 04/23/2017, and 1 unit PRBCs 7/20. 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  Requiring 2 clips + epi + APC. 1 unit PRBCs 8/6. Received PRBC on 8/10, 8/22, and 9/1.     4. Ventricular tachycardia: Status post ventricular tachycardia 2 on 7/13. Possible suction event. VAD speed turned  down to 2700.  Recurrent VT 7/17. EP called to bedside and Paediatric nurse. Overdrive pacing successful for about 10 minutes but VT recurred.  Milrinone turned off, remained in VT overnight but hemodynamically stable.  Speed decreased to 2600.  On 7/18, we were able to  pace him out of VT.  Lidocaine added then stopped due to confusion/mental status changes.  He had VT 7/22 about 30 minutes after getting IV Lasix, had to be paced out again => may have been due to rapid fluid shift.  He was again paced out of  VT x3 on 7/25. Got amio bolus x 2 and started back on IV infusion.  Transitioned to amiodarone 400 mg tid.  On 8/1 had recurrent VT and paced out again.  Recurrent VT 8/2 per EP sotalol started along with amiodarone, sotalol decreased to once daily with bradycardia. He remained on mexiletine amio, and sotalol.  5. Paroxysmal Atrial fibrillation:  Developed atrial fibrillation with RVR in setting of aspiration PNA on 7/4.  He was maintained in NSR on triple anti arrhythmic therapies.  6. Malnutrition: Prealbumin <0.5 on 7/30. Albumin on 8/15 was 1.7. Dietitians followed closely. Due to severe malnutrition and failed swallowing study he was placed on tube feeds.Speech therapy follwwed    7. Aspiration PNA with acute hypoxemic respiratory failure on 7/4: He was extubated on 7/6. Tracheal aspirate with Klebsiella and citrobacter. Both sensitive to Zosyn. Completed abx 7/13. Possible aspiration 8/8. Treated with meropenem. Stopped 8/24 8. Acute Respiratory Failure: Intubated 7/17 in setting of complex GI intervention. Extubated 7/18. Re-intubated 8/8.  Weak respiratory muscles. He has not wanted a tracheostomy. Later extubated on 8/23 with not plans to reintubate.    - DNI, would not re-intubate.  9. CAD: s/p CABG 2012. No chest pain.  Off aspirin due to GI bleeding. No change.  10. Gout: Severe tophaceous gout. Treated intermittently with steroids and colchicine.  11. Delirium: Neuro consulted due to delirium. Possibly related to lidocaine.  Lidocaine stopped. CT of head negative. EEG ok. No further workup per neuro. Neuro reconsulted 8/7 with AMS => ?related to fever/infection.   CT head 05/27/17 and 8/10 with lacunar infarcts but do not appear acute.  Seizure activity  versus myoclonus => Depakote started.  Switched to [place] with ?drug rash from Depakote.    12. UTI: Pseudomonas UTI => treated with antibiotics.    13. Unstageable Pressure Ulcer: Sacrum. He intermittently refused repositioning . WOC consulted. PT followed for  pulse lavage to assist with debridement. He required IV medication for pulse lavage.  14. Bradycardia: With hypotension so back-up pacing  Was set at 60 bpm.  15. Hypothyroidism: May be related to amiodarone, he is on Levoxyl. No change.    16. ID: Course of meropenem was finished 8/24.  Recurrent fever 9/1 with increasing lethargy and secretions.  Lung source versus wound source versus UTI.  CXR with bibasilar atelectasis. PCT elevated at 3.29. Urine cultures grew Pseudomonas. He is on vanco/Zosyn, Zosyn covers the Pseudomonas. He also required frequent nasotracheal suctioning as he was not able to protect his airway.  17. Severe Deconditioning 18. Hypernatremia: sodium peaked at 156. He given free water boluses.   19. Thrombocytopenia: Mild but plts dropped 50% over a few days. HIT panel was negative.  20. Pharyngeal muscle weakness: Due to difficulty swallowing speech therapy consulted. Failed FEES with aspiration noted. He was NPO with tube feeds.   21. Pain: Ongoing significant pain from gout and decubitus ulcer.  RHC 04/14/2017  RA 7 RV 48/7 PA 52/23, mean 33 PCWP mean 17 CVP/PCWP 0.41  PAPi 4.1 Oxygen saturations: PA 62% AO 100% Cardiac Output (Fick) 6.13  Cardiac Index (Fick) 2.89 PVR 2.6 WU Cardiac Output (Thermo) 4.88 Cardiac Index (Thermo) 2.3  PVR 3.3 WU  Duration of Discharge Encounter: Greater than 35 minutes   Signed, Darrick Grinder, NP 06/30/2017, 12:22 PM

## 2017-07-21 NOTE — Progress Notes (Signed)
Speech Language Pathology Discharge Patient Details Name: James FabianHarry D [name] MRN: [id number] DOB: [birth year] Today's Date: 07/14/2017 Time:  -     Patient discharged from SLP services secondary to medical decline - will need to re-order SLP to resume therapy services.  Please see latest therapy progress note for current level of functioning and progress toward goals.    Progress and discharge plan discussed with patient and/or caregiver: Patient unable to participate in discharge planning and no caregivers available  GO     [name], [name] 06/29/2017, 2:37 PM   [name] [name] M.Ed [employer]-SLP Pager [phone]

## 2017-07-21 NOTE — Progress Notes (Signed)
CSW informed earlier in the day that patient was declining and family in agreement not to escalate care. CSW provided supportive intervention throughout the afternoon to wife, son, mother and multiple other family members. Patient passed away around 3pm with family present. Lasandra BeechJackie Shaughn Thomley, LCSW, CCSW-MCS [phone]

## 2017-07-21 NOTE — Progress Notes (Signed)
Patient ID: James [name], male   DOB: 09/06/1961, 56 y.o.   MRN: [id number]  MAP and VAD flow dropped steadily through the day.  We made the decision with family not to escalate care.  [name] expired with family at bedside around 3 pm and HVAD was turned off.   [name] 07/02/2017 3:08 PM

## 2017-07-21 NOTE — Progress Notes (Signed)
LVAD Inpatient Coordinator Rounding Note:  Admitted 03/27/2017 due to A/C Heart failure.   [employer] LVAD implanted on 04/05/2017 by Dr. Laneta SimmersBartle as DT VAD.  Transferred to 4E 05/17/17.  Transferred back to ICU 2H09 on 05/28/17 with possible aspiration pneumonia requiring re-intubation.  Transferred to 2C05 on 06/21/17.   Pt is communicating that he is tired and is ready to go to heaven. Pt is tachypneic and has a large amount of audible secretions.     Vital signs: Temp: 98.5 HR: 73 Doppler: 72 Auto BP: 92/64 (73) O2 Sat: 98% on 2 LNC Wt in lbs: Marland Kitchen... 240>245>235>234>236>239>234>232>232>232>233>238>266>260  LVAD interrogation reveals:  Speed: 2540 Flow:  4.8 Power: 3.7w Alarms: none Peak: 6.9 Trough: 3.8 HCT:  24  Low flow alarm setting: 2.5 High watt alarm setting: 6.0  Suction: OFF  Lavare cycle: on  Blood Products: 6/28> 5 PRBC's, 6 FFP 7/1> 1 PRBC 7/9> 1 PRBC 7/13>3 PRBC 7/14>3 PRBC 7/15> 1 PRBC 7/16>3PRBC 7/17> 3 PRBC 7/20> 1 PRBC 8/6> 1 PRBC 8/10 > 1 PRBC 8/22 > 1 PRBC  Gtts: Angiomax per pharmacy - switched to this from heparin due to platelets dropping - stopped 06/21/17 Fentanyl gtt - started 07/09/2017 for pain management  GI: 04/23/17 developed ileus with respiratory compromise - NG with 3 L suctioned out 05/03/17 - melena due to acute GI bleed -- PCs given as needed 04/27/2017 - 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  Neuro: 05/27/17- Neurology consult for lethargy, mouth tremors, not vocal, not following commands> Head CT and EEG 05/28/17 - Continual EEG monitoring  05/30/17 - started Depakote for seizure activity - stopped 06/03/17 for rash - will start Keppra   Arrhythmia: 04/24/17 - Afib with RVR - started amiodarone 05/02/17 - 2 sustained episodes of VT - cardioverted x 1; second broke with overdrive pacing 7/17- wide complex tachycardia- Amio bolus x3 and gtt at 60 mg/hr 05/07/17 - sustained VT -  converted with overdrive pacing; Lidocaine started in addition to amiodarone 05/08/17 - Lidocaine stopped due to confusion (lido level 12.4) 7/22 - Sustained VT with rate 130 after getting IV lasix. Pace terminated, back on IV amiodarone 05/14/17 - sustained VT - converted 3 separate occassions with overdrive pacing 05/19/17 - sustained VT-converted with overdrive pacing  8/1 - VT pace terminated 8/2 - VT started Sotalol 05/26/17- Afib rate in the 50's 05/27/17 - NSR   Respiratory: 04/18/17 - Extubated 04/23/17 - re-intubated due to respiratory failure secondary to suspected aspiration pneumonia 04/25/17-extubated 04/20/2017- intubated for EGD 05/09/2017- extubated 05/28/17 - re-intubated for suspected aspiration pneumonia 06/11/17 - one way extubation  Drive Line: Daily Dressing Kits with Aquacell AG silver strips per protocol -will advance to every other day dressing changes. Due 07/20/2017 VAD Coordinator will change the dressing tomorrow. May be able to advance to twice weekly dressing changes.   Labs:  LDH trend: .Marland Kitchen...188>142>138>127>128>148>164>175>170>187>175>164>164  INR trend: .... 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 He had epinephrine, APC,and 2 clips.  - 7/19 - Lidocaine gtt stopped due to confusion (lido toxicity) - 8/7- Neurology consult- lethargy, mouth tremors, not vocal, not following commands>Head CT and EEG, blood and urine  cultures sent, Ammonia-20 -8/8 - re-intubated for possible aspiration pneumonia   Plan/Recommendations:  1. Currently  every other day dressing changes, will see if we can advance.  2. Please page VAD pager for any equipment or patient needs.  Hessie Diener RN, VAD Coordinator 24/7 pager [phone]

## 2017-07-21 NOTE — Progress Notes (Signed)
CRITICAL VALUE ALERT  Critical Value:  INR 5.41  Date & Time Notied:  07/18/2017 @ 0451  Provider Notified: 06/30/2017  Orders Received/Actions taken: No new orders

## 2017-07-21 NOTE — Progress Notes (Signed)
Patient ID: James [name], male   DOB: Feb 22, 1961, 56 y.o.   MRN: [id number]   Advanced Heart Failure VAD Team Note  Subjective:    Events: --HVAD placed 6/28.  Returned to the OR that evening with high chest tube output, evacuation of mediastinal hematoma.  --Extubated 6/29. Milrinone stopped on 7/4. --7/4, patient developed ileus with respiratory compromise. NGT placed with 3 L suctioned out.  CXR with suspicion for aspiration PNA.  Patient had to be intubated.  He went into atrial fibrillation with RVR.  He became hypotensive and was started on norepinephrine and phenylephrine.  Amiodarone gtt begun.    --Extubated again on 7/6.  -- 7/13 Had 2 sustained episodes of VT. Had to be cardioverted 1. Second episode broke with overdrive pacing with Dr. Caryl Comes. VAD speed turned down to 2700.  --Melena due to acute GI bleed --> 2 units PRBCs 7/14, 2 units 7/15. 3 U PRBCs 7/16,  04/22/2017 3 UPRBCs.  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  He had epinephrine, APC, and 2 clips. Intubated prior to procedure and placed on norepinephrine. Speed dropped to 2660.  --7/18 Back in VT with overdrive pacing. Lidocaine was started in addition to amiodarone. Extubated. --7/19 lidocaine stopped with confusion. Neuro consulted.  EEG normal.  CT of head no acute findings. 7/20 confusion had resolved.  --1 unit PRBCs 7/20.  --7/22, developed sustained VT with rate around 130 shortly after getting IV Lasix.  We were able to pace him out and back to NSR.  He was put back on amiodarone gtt.  --More VT on 7/25, paced out.  Back on IV amiodarone gtt.  NSR.  -- 7/31 Ramp ECHO decrease speed 2600 rpm.  -- 8/1 VT- paced out -- 8/2 VT - started on sotalol -- 8/6 Hgb 7.5, 1 unit PRBCs -- 8/7 Hgb 9.5, confused.  Head CT with right superior cerebellar artery lacunar infarcts, not acute. -- 8/8 Possible aspiration. Reintubated. -- 8/10 possible seizure => Depakote.  CT head no  change.  1 unit PRBCs.  -- Multiple suction alarms on 8/17 night which persisted so suction alarm deactivated.  -- Extubated 8/23 -- 9/1 got 1 unit PRBCs  MAP 70s-80s.  Remains NPO due to pharyngeal muscle weakness.  CVP 7-9 today. He is awake and responds to questioning with yes/no answers.  Continues to have pain.   CXR 9/3 with bibasilar atelectasis.   Fever 9/3, started on vanco/Zosyn.  Urine with Pseudomonas.  Tmax 100.1 yesterday.  Afebrile this morning.  WBCs stable.  BUN/creatinine rising.    HIT negative, plts stabilized.  INR high now, holding warfarin.   Has been getting hydrotherapy.   HVAD INTERROGATION:  HVAD:  Flow 5 liters/min, speed 2450,  power 3.7 W,  Peak 6.8 Trough 4.2. Suction alarms off. Lavare on.   Objective:    Vital Signs:   Temp:  [98.5 F (36.9 C)-100.1 F (37.8 C)] 98.5 F (36.9 C) (09/05 0726) Pulse Rate:  [73] 73 (09/05 0726) Resp:  [20] 20 (09/05 0726) BP: (74)/(63) 74/63 (09/05 0344) SpO2:  [98 %] 98 % (09/05 0726) Weight:  [260 lb (117.9 kg)] 260 lb (117.9 kg) (09/05 0356) Last BM Date: 06/24/17 Mean arterial Pressure 70s-80s Intake/Output:   Intake/Output Summary (Last 24 hours) at 07-24-17 0745 Last data filed at 24-Jul-2017 0400  Gross per 24 hour  Intake             3870 ml  Output  1225 ml  Net             2645 ml     Physical Exam    Physical Exam: CVP 7-9 GENERAL: Fatigued and chronically ill appearing. NAD.  HEENT: Normal. NECK: Supple, JVP 8-9cm. Carotids OK.  CARDIAC: Mechanical heart sounds with LVAD hum present.  LUNGS: Coarse breath sounds bilaterally, more clear than yesterday.  ABDOMEN: NT, ND, no HSM. No bruits or masses. +BS LVAD exit site: Dressing dry and intact. No erythema or drainage. Stabilization device present and accurately applied. Driveline dressing changed daily per sterile technique. EXTREMITIES: Warm and dry. No cyanosis or clubbing. Diffuse gouty abnormalities. LEs wrapped. 1+ edema  into thighs.  NEUROLOGIC: Awake, answers simple questions.   Telemetry   Personally reviewed, NSR 60s-70s   Labs   Basic Metabolic Panel:  Recent Labs Lab 06/21/17 0329 06/22/17 0253 06/23/17 0348 06/24/17 0516 07-07-2017 0344  NA 150* 147* 147* 144 142  K 3.5 3.7 3.9 3.7 3.7  CL 122* 118* 117* 116* 116*  CO2 _0 GLUCOSE 136* 165* 127* 140* 102*  BUN 55* 64* 70* 74* 81*  CREATININE 1.33* 1.51* 1.83* 1.98* 2.16*  CALCIUM 7.8* 7.7* 7.5* 7.1* 7.1*  MG  --  1.8  --   --   --     Liver Function Tests: No results for input(s): AST, ALT, ALKPHOS, BILITOT, PROT, ALBUMIN in the last 168 hours. No results for input(s): LIPASE, AMYLASE in the last 168 hours. No results for input(s): AMMONIA in the last 168 hours.  CBC:  Recent Labs Lab 06/21/17 0329 06/22/17 0253 06/23/17 0348 06/24/17 0516 2017-07-07 0344  WBC 9.7 12.5* 15.2* 13.2* 13.9*  HGB 7.8* 8.1* 8.0* 7.5* 7.4*  HCT 24.4* 25.6* 25.2* 23.5* 23.3*  MCV 91.0 89.8 90.6 89.7 89.6  PLT 131* 140* 137* 150 153    INR:  Recent Labs Lab 06/22/17 0253 06/23/17 0348 06/23/17 0513 06/24/17 0516 07-Jul-2017 0344  INR 2.83 4.24* 4.28* 5.53* 5.41*   Other results:    Imaging   Dg Chest Port 1 View  Result Date: 06/23/2017 CLINICAL DATA:  Indwelling left ventricular assist device. Follow-up basilar atelectasis. EXAM: PORTABLE CHEST 1 VIEW COMPARISON:  06/22/2017, 06/12/2017 and earlier. FINDINGS: LV assist device unchanged. Left subclavian single lead transvenous pacemaker unchanged and appears intact. Feeding tube courses below the diaphragm into the stomach though its tip is not included on the image. Right arm PICC tip projects at or near the cavoatrial junction, unchanged. Markedly suboptimal inspiration with bibasilar atelectasis, unchanged. Lungs otherwise clear. Mild pulmonary venous hypertension without overt edema. Cardiac silhouette markedly enlarged, unchanged. IMPRESSION: 1.  Support apparatus  satisfactory. 2. Markedly suboptimal inspiration which accounts for bibasilar atelectasis, stable. No acute cardiopulmonary disease otherwise. Electronically Signed   By: Evangeline Dakin M.D.   On: 06/23/2017 09:20    Medications:    Scheduled Medications: . amiodarone  200 mg Per Tube Daily  . amLODipine  10 mg Oral Daily  . chlorhexidine gluconate (MEDLINE KIT)  15 mL Mouth Rinse BID  . Chlorhexidine Gluconate Cloth  6 each Topical q morning - 10a  . colchicine  0.6 mg Per Tube Daily  . collagenase   Topical Daily  . doxazosin  2 mg Oral Q12H  . febuxostat  120 mg Oral Daily  . feeding supplement (NEPRO CARB STEADY)  1,000 mL Per Tube Q24H  . feeding supplement (PRO-STAT SUGAR FREE 64)  60 mL Per Tube BID  .  free water  400 mL Per Tube Q4H  . glycopyrrolate  0.2 mg Intravenous QID  . hydrALAZINE  100 mg Oral Q8H  . insulin aspart  0-15 Units Subcutaneous Q4H  . levETIRAcetam  500 mg Per Tube BID  . levothyroxine  50 mcg Per NG tube QAC breakfast  . magnesium oxide  400 mg Per Tube Daily  . mouth rinse  15 mL Mouth Rinse Q4H  . mexiletine  300 mg Per Tube Q12H  . pantoprazole sodium  40 mg Per Tube BID  . sodium chloride flush  10-40 mL Intracatheter Q12H  . sotalol  80 mg Per Tube Daily  . spironolactone  25 mg Oral Daily  . Warfarin - Pharmacist Dosing Inpatient   Does not apply q1800    Infusions: . sodium chloride 10 mL/hr at 07-15-17 0400  . piperacillin-tazobactam (ZOSYN)  IV 3.375 g (15-Jul-2017 7858)  . vancomycin Stopped (06/24/17 2336)    PRN Medications: sodium chloride, acetaminophen (TYLENOL) oral liquid 160 mg/5 mL, albuterol, docusate, fentaNYL (SUBLIMAZE) injection, Gerhardt's butt cream, hydrALAZINE, neomycin-bacitracin-polymyxin, ondansetron (ZOFRAN) IV, pneumococcal 23 valent vaccine, silver nitrate applicators, sodium chloride flush   Patient Profile   56 yo with CAD s/p CABG, ischemic cardiomyopathy/chronic systolic CHF, tophaceous gout, and CKD stage 3  was admitted for diuresis and consideration for LVAD placement. S/p HVAD on 6/28  Assessment/Plan:    1. Acute/chronic systolic CHF s/p HVAD: Ischemic cardiomyopathy.  St Jude ICD.  Echo (6/18) with EF 15%, mildly dilated RV with moderately decreased systolic function.  s/p HVAD placement 6/28 and had to return to OR to evacuate mediastinal hematoma.  He had been weaned off pressors/milrinone, but developed ileus w/ likely aspiration event 7/4, re-intubated, developed afib/RVR requiring norepinephrine. Extubated 7/6.  VAD speed turned down to 2540 with VT. VAD parameters currently look good.  Milrinone stopped 7/17 with recurrent VT.  He was started back on norepinephrine 8/9 due to soft BP after intubation and suspected septic shock, now off norepinephrine.  MAP finally improved to goal, getting hydralazine per tube as well as amlodipine, doxazosin, and spironolactone.  Flow has been higher with improved BP. He has had massive 3rd spacing. Legs now wrapped.  Today, CVP 7-9. Now with rise in BUN/creatinine and fever on 9/3, concerned for developing sepsis.  - Leave off diuretics.    - Unna boots in place.  - MAP ok but has trended down some => will decrease doxazosin and amlodipine today.  Hold meds as needed.     - Off ASA with GI Bleed  - Goal INR 2-2.5, warfarin on hold with high INR.  2. AKI on CKD stage 3: BUN/creatinine up, concern for developing sepsis.  Off Lasix.  3. Symptomatic anemia due to acute UGI bleeding:  Received 3 units PRBCs 7/16 and 3 units PRBCs on 05/03/2017. 1 unit PRBCs 7/20. 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  Requiring 2 clips + epi + APC. 1 unit PRBCs 8/6.  He had 1 unit PRBCs 8/10 to provide volume.  1 unit PRBCs on 8/22 and again 9/1.  No overt bleeding.  Hgb 7.4 today.  - Will hold off on transfusion until hgb < 7.  - Got Feraheme 7/28.  - Continue Protonix 40 po bid.  4. Ventricular tachycardia: Status post ventricular tachycardia  2 on 7/13. Possible suction event. VAD speed turned down to 2700.  Recurrent VT 7/17. EP called to bedside. Overdrive pacing successful for about 10 minutes  but VT recurred.  Milrinone turned off, remained in VT overnight but hemodynamically stable.  Speed decreased to 2600.  On 7/18, we were able to pace him out of VT.  Lidocaine added then stopped due to confusion/mental status changes.  He had VT 7/22 about 30 minutes after getting IV Lasix, had to be paced out again => may have been due to rapid fluid shift.  Pt paced out of VT 3 separate occasions 7/25. Got amio bolus x 2 and started back on IV infusion.  Transitioned to amiodarone 400 mg tid.  On 8/1 had recurrent VT and paced out again.  Recurrent VT 8/2 per EP sotalol started along with amiodarone, sotalol decreased to once daily with bradycardia. Remains quiescent.  - Continue mexiletine to 300 mg BID, sotalol 80 mg daily.  - Continue amiodarone 200 mg daily.  - Keep K > 4.0 Mg > 2.0  5. Paroxysmal Atrial fibrillation:  Developed atrial fibrillation with RVR in setting of aspiration PNA on 7/4.  In NSR currently on triple AA therapy. No change.  6. Malnutrition:  Continue tube feeds via Cor-track. Nutrition following. No change.  7. Aspiration PNA with acute hypoxemic respiratory failure on 7/4: He was extubated on 7/6. Tracheal aspirate with Klebsiella and citrobacter. Both sensitive to Zosyn. Completed abx 7/13. Possible aspiration 8/8. Treated with meropenem. Stopped 8/24 - He remains NPO, everything via Cor-track.  May end up needing PEG if he survives current illness.  8. Acute Respiratory Failure: Intubated 7/17 in setting of complex GI intervention. Extubated 7/18. Re-intubated 8/8.  Weak respiratory muscles. He has not wanted a tracheostomy.  Extubated on 8/22.  CXR 9/3 with bibasilar atelectasis.  - Increased secretions requiring frequent suctioning.  - DNI, would not re-intubate.  9. CAD: s/p CABG 2012. No chest pain.  Off aspirin due  to GI bleeding. No change.  10. Gout: Severe tophaceous gout.  - Continue uloric.  - Restart colchicine once daily with worsening gouty pain.   11. Delirium: Appreciate neuro input Possibly related to lidocaine.  Lidocaine stopped. CT of head negative. EEG ok. No further workup per neuro. Neuro  reconsulted 8/7 with AMS => ?related to fever/infection.  He has been more clear on vent.  CT head 05/27/17 and 8/10 with lacunar infarcts but do not appear acute.  Seizure activity versus myoclonus => Depakote started.  Switched to [place] with ?drug rash from Depakote.  He was awake/alert this morning. 12. UTI: Pseudomonas UTI => on Zosyn.   13. Unstageable Pressure Ulcer: Sacrum. Santyl daily to enzymatically debride. Reposition R to L.  - Continue with hydrotherapy. No change. Appreciate WOC care.  14. Bradycardia: With hypotension.  Currently back-up pacing set at 60 bpm.  Does not have atrial lead so pacing asynchronously. Currently pacing only rarely.    15. Hypothyroidism: May be related to amiodarone, he is on Levoxyl. No change.    16. ID: Course of meropenem was finished 8/24.  Now with recurrent fever 9/1 with increasing lethargy and secretions.  Lung source versus wound source versus UTI.  CXR with bibasilar atelectasis. PCT elevated at 3.29. Has now grown Pseudomonas in urine.  - Continue suctioning frequently, he is DNI at this point.  - No growth so far with blood cultures.   - He is on vanco/Zosyn, Zosyn covers the Pseudomonas.   18. Severe Deconditioning: Critical care myopathy.  Continue PT as able.  19. Hypernatremia: Resolved. Continue free water boluses 400 q4 hrs for now. 20. Thrombocytopenia: Mild but  plts dropped 50% over a few days. HIT negative.  Now off bivalirudin with stable plts. 21. Pharyngeal muscle weakness: Remains NPO with tube feeds.  Speech/swallow following. 22. Pain: Ongoing significant pain from gout and decubitus ulcer.  I am going to put him on a low dose Fentanyl  gtt.   Patient has worsened overall with recurrent infection/sepsis.  He is now DNR/DNI.  Palliative care service and I have discussed situation with wife, mother and son.  We are going to continue current treatment but will focus on comfort care as well.  I do not see him recovering from the current setback.    I reviewed the HVAD parameters from today, and compared the results to the patient's prior recorded data.  No programming changes were made.  The HVAD is functioning within specified parameters.    CRITICAL CARE Performed by: Loralie Champagne  Total critical care time: 35 minutes  Critical care time was exclusive of separately billable procedures and treating other patients.  Critical care was necessary to treat or prevent imminent or life-threatening deterioration.  Critical care was time spent personally by me on the following activities: development of treatment plan with patient and/or surrogate as well as nursing, discussions with consultants, evaluation of patient's response to treatment, examination of patient, obtaining history from patient or surrogate, ordering and performing treatments and interventions, ordering and review of laboratory studies, ordering and review of radiographic studies, pulse oximetry and re-evaluation of patient's condition.  Loralie Champagne, MD 04-Jul-2017, 7:45 AM  VAD Team --- VAD ISSUES ONLY--- Pager [phone] (7am - 7am) Advanced Heart Failure Team  Pager [phone] (M-F; 7a - 4p)  Please contact [facility] for night-coverage after hours (4p -7a ) and weekends on [email or web address]

## 2017-07-21 NOTE — Progress Notes (Signed)
Patient ID: James [name], male   DOB: 07/04/1961, 56 y.o.   MRN: [id number]  This NP visited patient at the bedside as a follow up for palliative needs and emotional support.  Patient's wife present at bedside.  Emotional support offered  Patient appears comfortable, minimally responsive to gentle touch.  Patient appears to be transitioning at EOL  Continued discussion regarding continued decline and failure to thrive. Now on Fentanyl gtt.  Wife is able to verbalize her understanding of the poor limited prognosis.  She is asking to describe what full comfort care would look like.  Detailed, focused on no escalation of care and consideration of beginning to minimize some of the current treatments specific to wound care.  Focus is comfort and dignity.  Questions and concerns addressed    Palliative medicine team will continue to support holistically.  Discussed with bedside nurse/Kate  Total time spent on the unit was 40 minutes. Time in   1000    time out 1040  Greater than 50% of the time was spent in counseling and coordination of care  [name] NP  Palliative Medicine Team Team Phone # [phone] Pager [phone]

## 2017-07-21 NOTE — Progress Notes (Signed)
Fentanyl drip started at 0832, medication did not scan correctly, however I verified concentration, rate, and dose with pharmacy.

## 2017-07-21 NOTE — Care Management (Signed)
Pt is failing to thrive post LVAD placement in June.  Palliative involved and pt has been placed on fentanyl drip for comfort measures.  CM will continue to follow for discharge needs

## 2017-07-21 NOTE — Progress Notes (Signed)
Per verbal order, MAP > 70 OK to give BP medications

## 2017-07-21 NOTE — Progress Notes (Signed)
PT Hydrotherapy Cancellation Note  Patient Details Name: James [name] MRN: [id number] DOB: [birth year]   Cancelled Treatment:    Reason Eval/Treat Not Completed: Medical issues which prohibited therapy.    Marylynn PearsonLaura D Kimori Tartaglia 06/29/2017, 1:14 PM   Conni SlipperLaura Berlin Mokry, PT, DPT Acute Rehabilitation Services Pager: [phone]

## 2017-07-21 NOTE — Progress Notes (Signed)
CRITICAL VALUE ALERT  Critical Value: Vancomycin trough 32  Date & Time Notied:  07/14/2017 1130  Provider Notified: LVAD team- Amy  Orders Received/Actions taken: Hold Vanc

## 2017-07-21 NOTE — Progress Notes (Signed)
Fentanyl gtt wasted. 125mL wasted in sink. Witnessed by Kirstie MirzaKendell Hill, RN.

## 2017-07-21 DEATH — deceased

## 2018-10-21 IMAGING — CT CT CHEST W/O CM
2 of 4 series · 13 of 36 positions shown, 16 images · non-contrast
Comparison: None.

CLINICAL DATA: Patient has chronic systolic CHF, being considered
for LVAD

EXAM:
CT CHEST, ABDOMEN AND PELVIS WITHOUT CONTRAST
TECHNIQUE: Multidetector CT imaging of the chest, abdomen and pelvis was
performed following the standard protocol without IV contrast.

[Series 3: cap wo 5.0 i31f 2 · axial · 0.80mm/px · z∈[-841,-311]mm · 10 of 128 slices shown, 13 images]
[im 11/128  mediastinal]
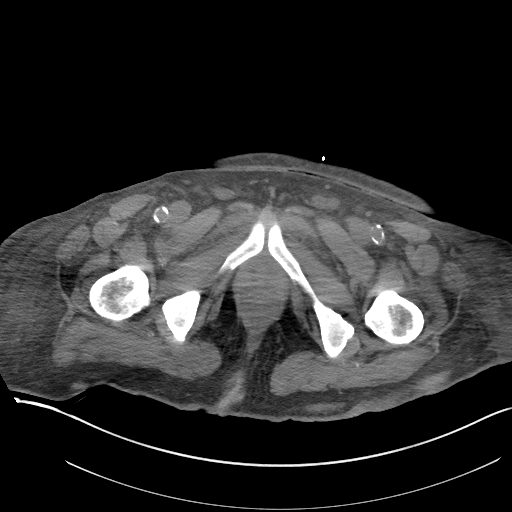
[im 11/128  lung]
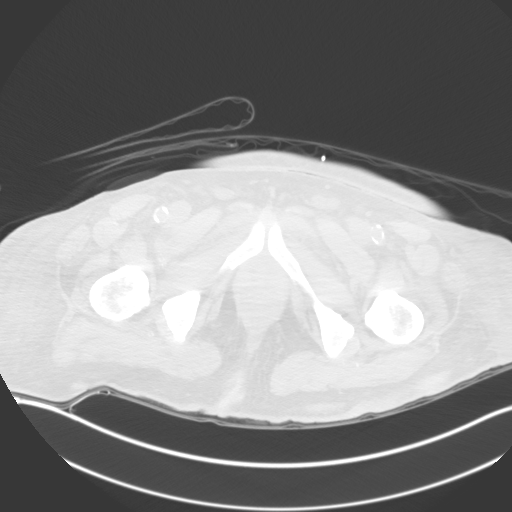
[im 22/128  lung]
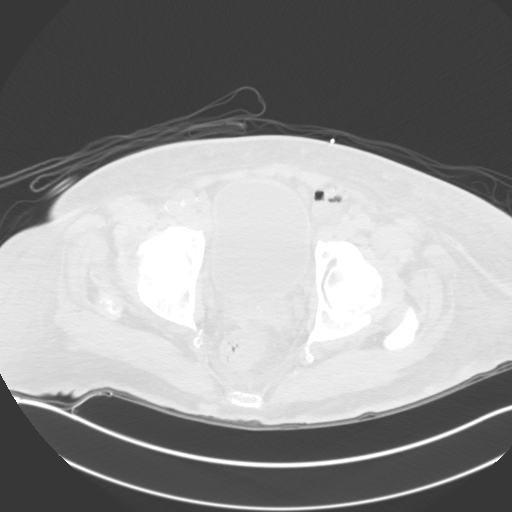
[im 32/128  lung]
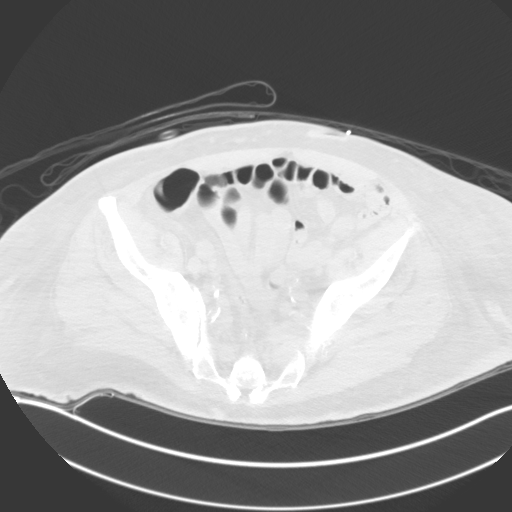
[im 43/128  lung]
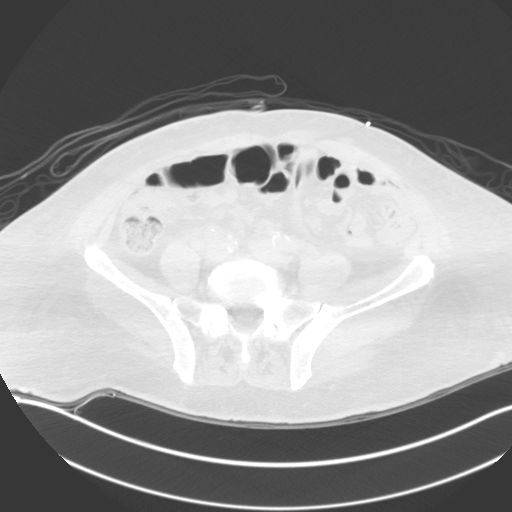
[im 53/128  mediastinal]
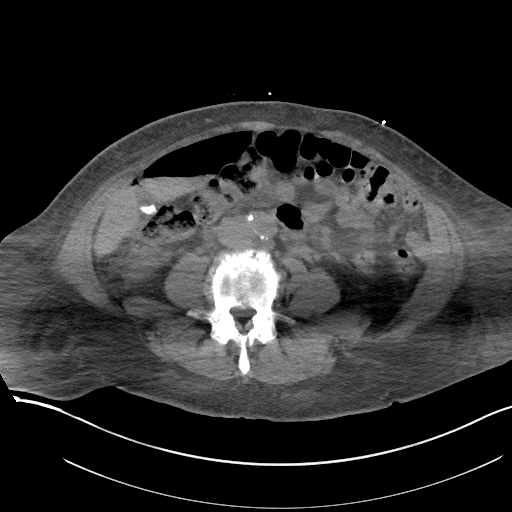
[im 53/128  lung]
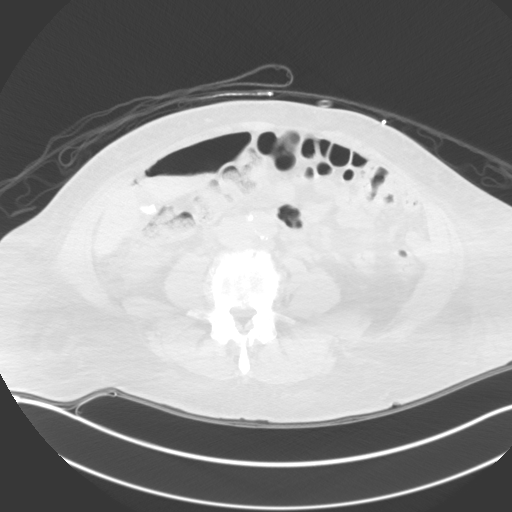
[im 75/128  lung]
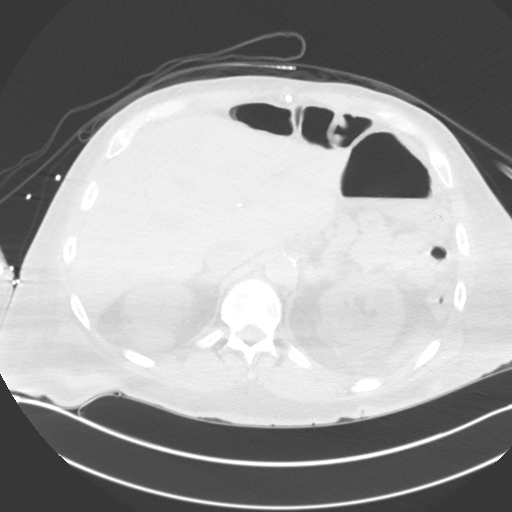
[im 85/128  lung]
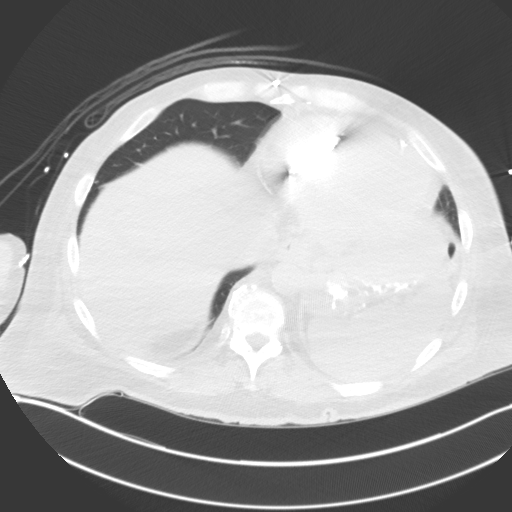
[im 96/128  lung]
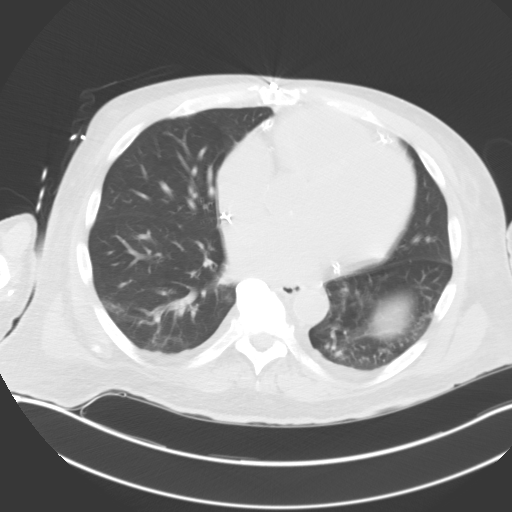
[im 106/128  mediastinal]
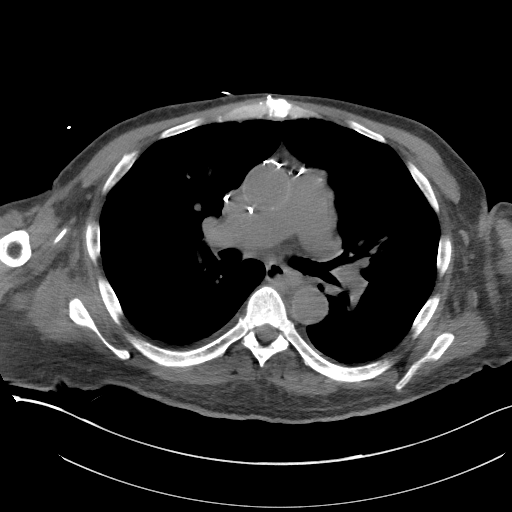
[im 106/128  lung]
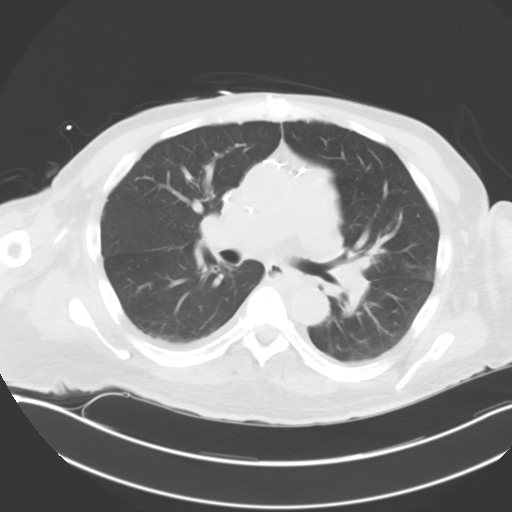
[im 117/128  lung]
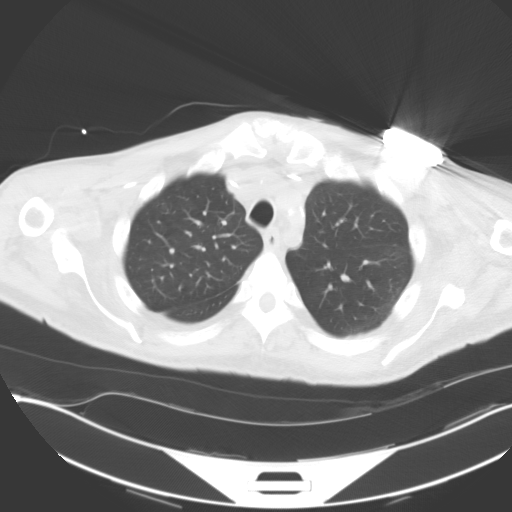

[Series 6: coronal · coronal · 0.72mm/px · 3 of 127 slices shown]
[im 26/127  lung]
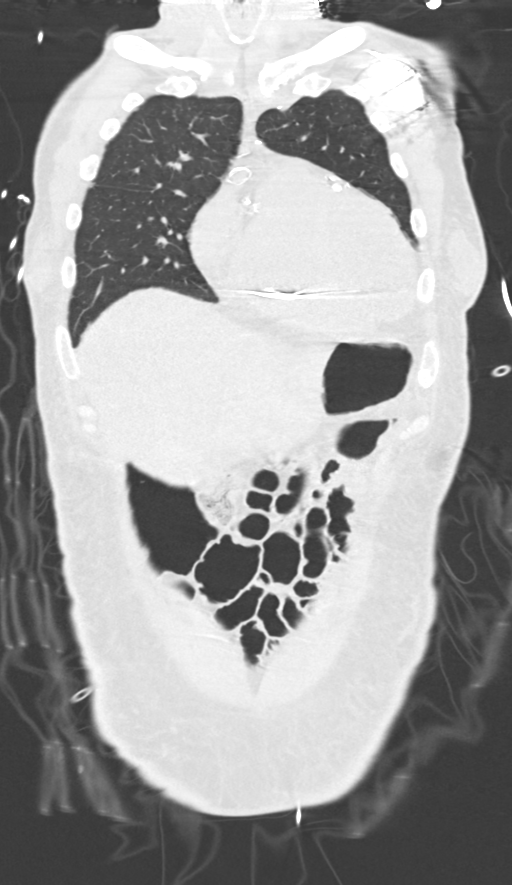
[im 51/127  lung]
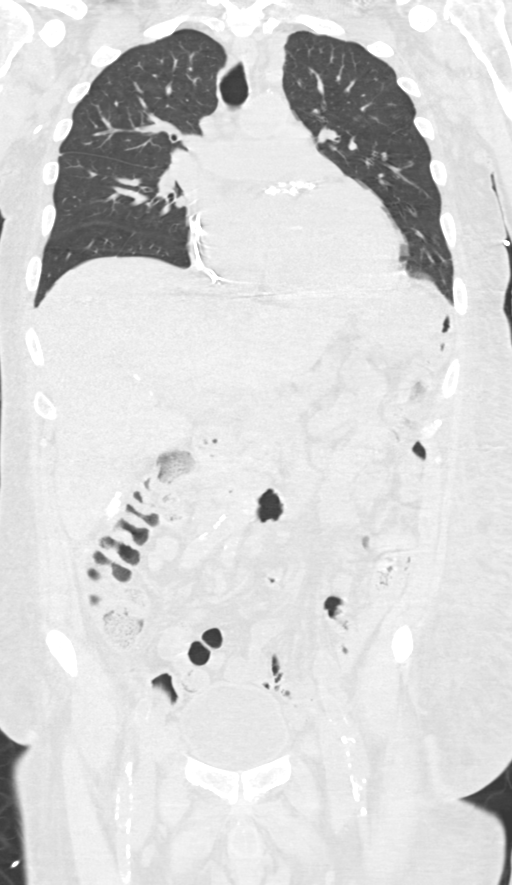
[im 76/127  lung]
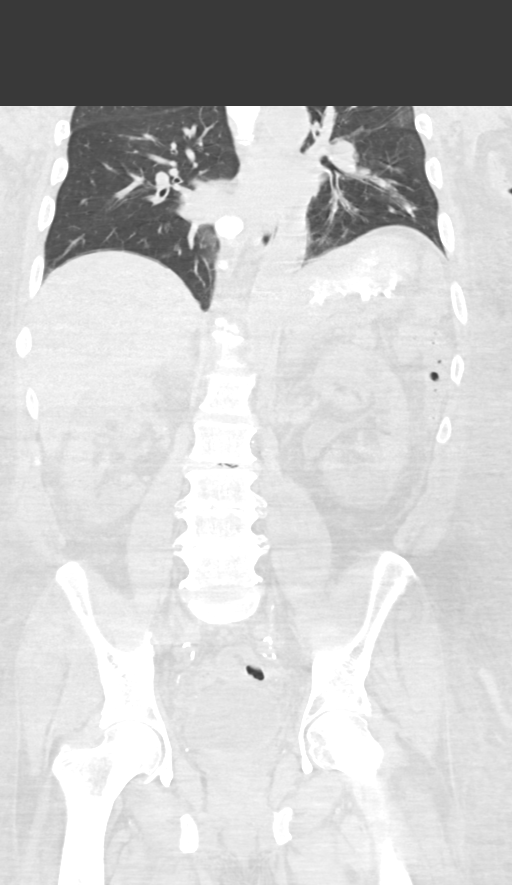

[13 of 36 positions shown; findings below may reference images not displayed]

FINDINGS: CT CHEST FINDINGS

Cardiovascular: Thoracic aorta is normal in caliber and
configuration. Scattered aortic atherosclerosis.

Cardiomegaly. No pericardial effusion. Diffuse coronary artery
calcifications. Status post CABG.

Mediastinum/Nodes: No mass or lymphadenopathy. Esophagus is
unremarkable. Trachea and central bronchi are unremarkable.

Lungs/Pleura: Mild atelectasis/scarring and mild pleural thickening
at each lung base. Lungs otherwise clear. Incidentally noted
calcified granuloma at the right lung apex.

Musculoskeletal: Mild degenerative spurring in the lower thoracic
spine. Degenerative changes at the shoulders. No acute or suspicious
osseous finding. Status post median sternotomy.

Edema within the subcutaneous soft tissues suggesting anasarca.
Bilateral gynecomastia.

CT ABDOMEN PELVIS FINDINGS

Hepatobiliary: Stones within the nondistended gallbladder. No focal
liver abnormality is seen. No bile duct dilatation seen.

Pancreas: Partially obscured.  Visualized portions unremarkable.

Spleen: Normal in size without focal abnormality.

Adrenals/Urinary Tract: Beam hardening artifact obscures portions of
each kidney probable renal cysts bilaterally, difficult to
definitively evaluate with Hounsfield unit measurement due to the
beam hardening artifact. No renal stone or hydronephrosis seen.
Bladder is unremarkable.

Stomach/Bowel: Bowel is normal in caliber. No bowel wall thickening
or evidence of bowel wall inflammation seen. Some portions of the
bowel are obscured by the aforementioned beam hardening artifact.

Vascular/Lymphatic: Aortic atherosclerosis. No enlarged lymph nodes
seen.

Reproductive: Unremarkable.

Other: No free fluid or abscess collections seen. No free
intraperitoneal air.

Elevation of the left hemidiaphragm, with upper stomach located
posterior to the left ventricle.

Musculoskeletal: Degenerative changes of the thoracolumbar spine,
moderate in degree. No acute or suspicious osseous finding.
Ill-defined edema within the subcutaneous soft tissues of the
abdomen and pelvis indicating anasarca.
IMPRESSION: 1. No acute findings within the chest, abdomen or pelvis.
2. No anatomic variants identified. There is elevation of the left
hemidiaphragm with stomach positioned posterior to the left
ventricle.
3. Cardiomegaly. Diffuse coronary artery calcifications. No
pericardial effusion seen.
4. Cholelithiasis without evidence of acute cholecystitis.
5. Aortic atherosclerosis.
6. Anasarca.
7. Mild study limitations detailed above.

## 2018-10-26 IMAGING — DX DG ABD PORTABLE 1V
1 series · 1 of 1 positions shown · non-contrast
Comparison: 04/12/2017, 04/17/2017

CLINICAL DATA: OG tube placement

EXAM:
PORTABLE ABDOMEN - 1 VIEW

[abdomen]
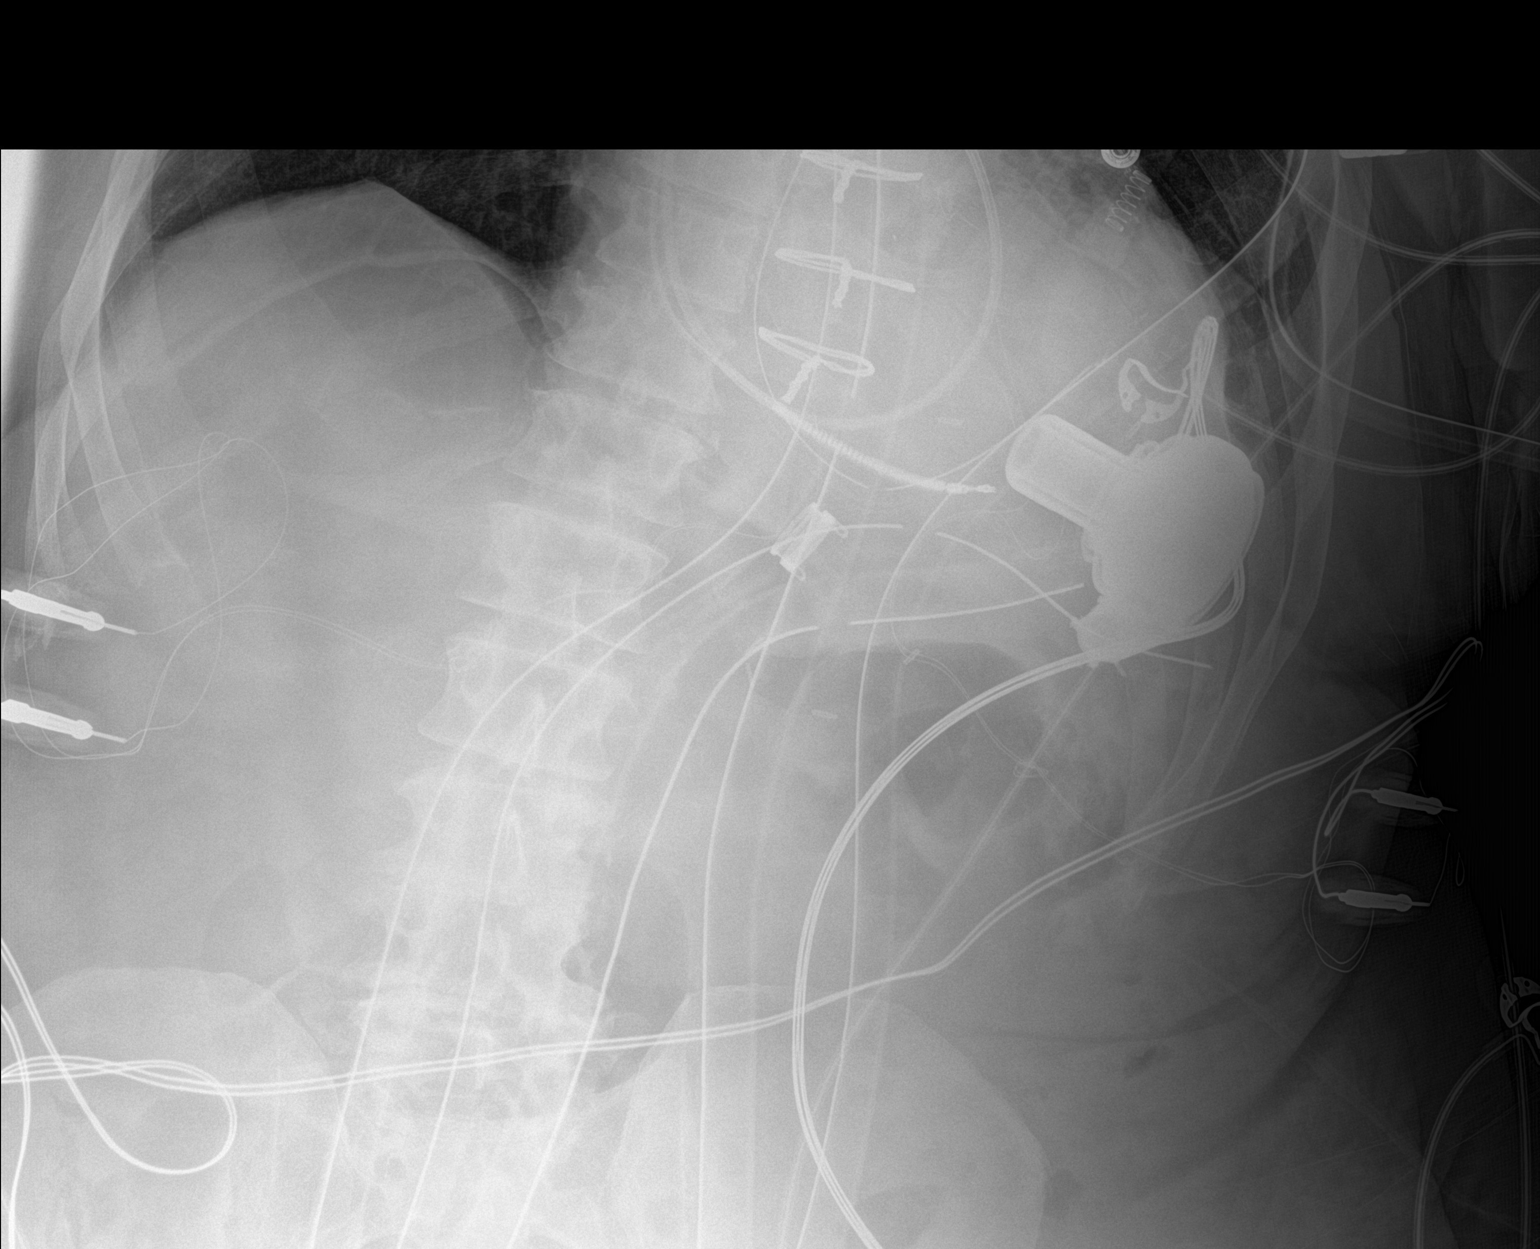

[1 of 1 positions shown; findings below may reference images not displayed]

FINDINGS: Postsurgical changes of the mediastinum. Dense left lower lobe
consolidation with small left pleural effusion. Multiple mediastinal
and chest drainage tubes with LVAD in place.

Esophageal tube tip turns upward and projects over the left lung
base, appearance could be due to elevated left diaphragm with tip in
the proximal stomach
IMPRESSION: 1. A esophageal tube tip projects cephalad over the left base,
uncertain if this is related to tip being positioned in the proximal
stomach with elevation of the left diaphragm as suggested on scout
images from prior CT.
2. Small left pleural effusion with dense consolidation in the left
lung base.

## 2018-10-27 IMAGING — DX DG CHEST 1V PORT
1 series · 1 of 1 positions shown · non-contrast
Comparison: 04/17/2017

CLINICAL DATA: Status post LVAD placement

EXAM:
PORTABLE CHEST 1 VIEW

[chest]
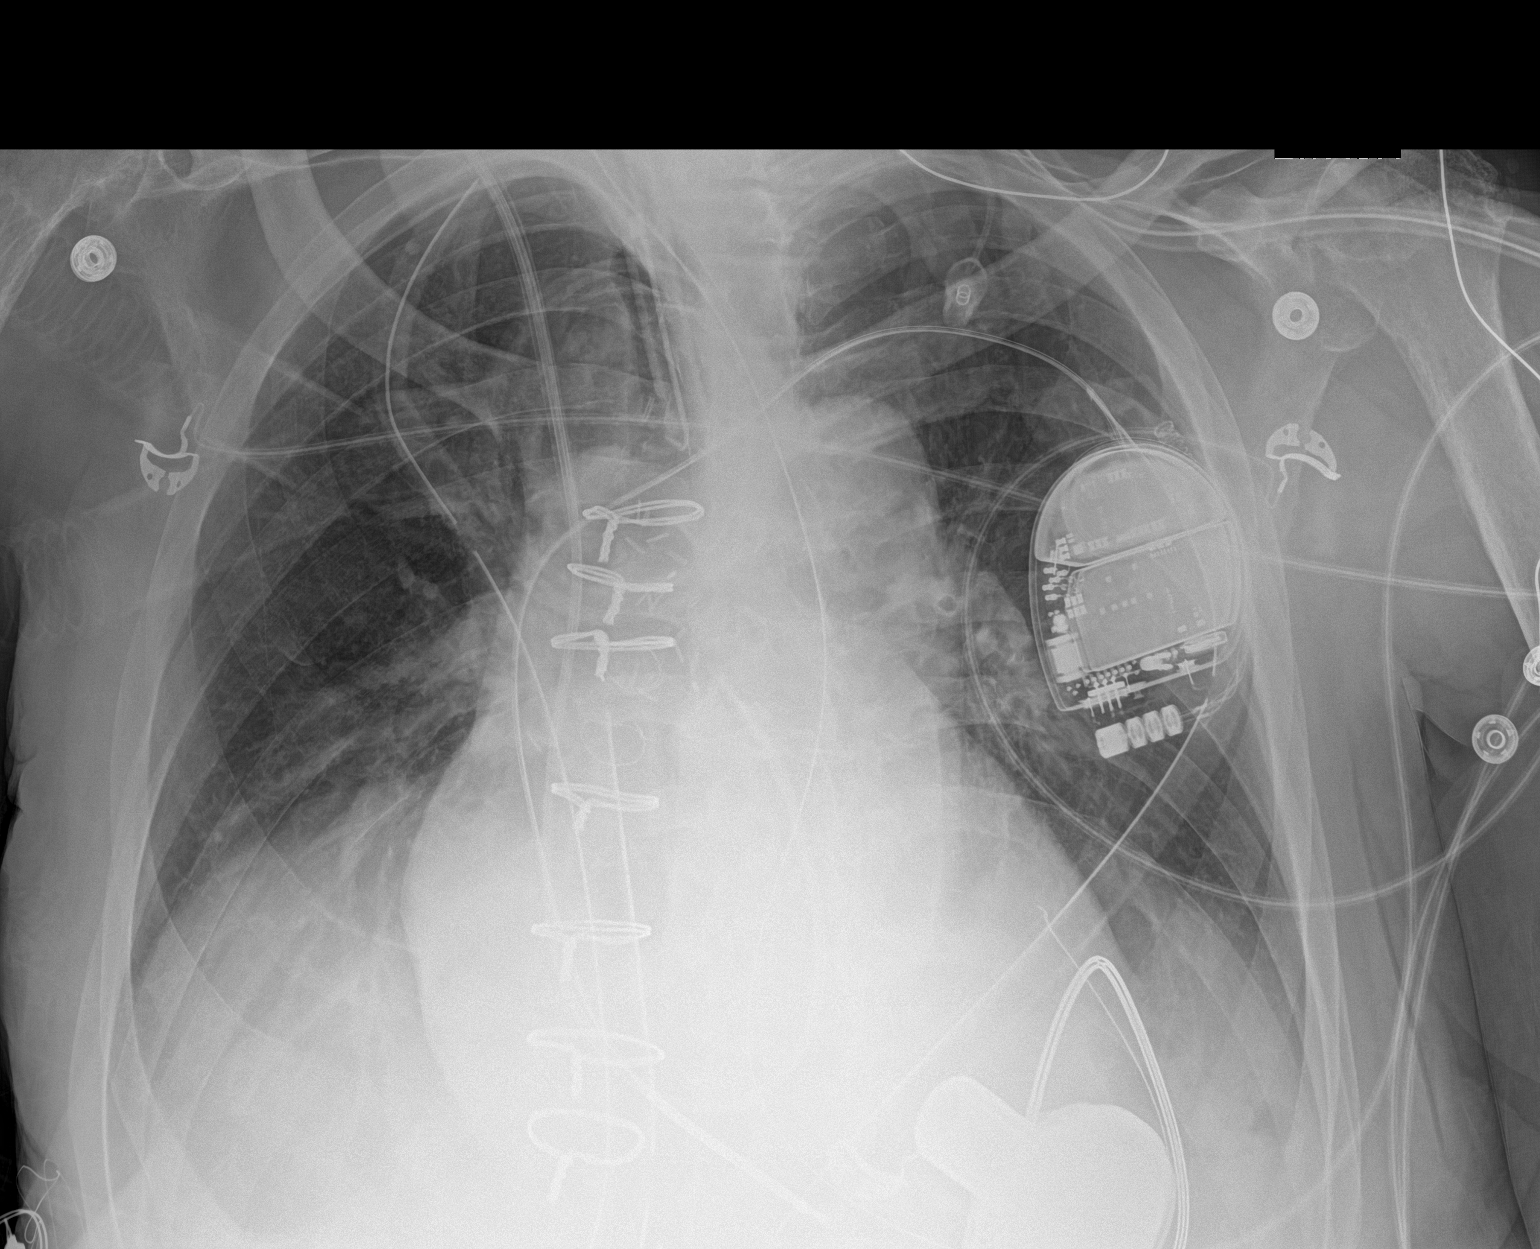

[1 of 1 positions shown; findings below may reference images not displayed]

FINDINGS: Endotracheal tube and nasogastric catheter are again seen and
stable. Left ventricular assist device is again noted and stable.
Defibrillator and Swan-Ganz catheter are noted in satisfactory
position. Mediastinal drain and bilateral thoracostomy catheters are
noted. No pneumothorax is seen. Lungs are well aerated bilaterally.
Improved aeration in the left base is noted although persistent
density is seen
IMPRESSION: Improving aeration in the left base.

Stable tubes and lines.

## 2021-04-13 NOTE — Progress Notes (Signed)
Error.  Letter from 2013 came to my inbasket.  Unable to delete.
